# Patient Record
Sex: Female | Born: 1960 | Race: Black or African American | Hispanic: No | Smoking: Former smoker
Health system: Southern US, Community
[De-identification: ages and names within clinical notes are randomized; demographics above are authoritative.]

## PROBLEM LIST (undated history)

## (undated) DIAGNOSIS — F1911 Other psychoactive substance abuse, in remission: Secondary | ICD-10-CM

## (undated) DIAGNOSIS — J302 Other seasonal allergic rhinitis: Secondary | ICD-10-CM

## (undated) DIAGNOSIS — F1411 Cocaine abuse, in remission: Secondary | ICD-10-CM

## (undated) DIAGNOSIS — K635 Polyp of colon: Secondary | ICD-10-CM

## (undated) DIAGNOSIS — I499 Cardiac arrhythmia, unspecified: Secondary | ICD-10-CM

## (undated) DIAGNOSIS — Z8541 Personal history of malignant neoplasm of cervix uteri: Secondary | ICD-10-CM

## (undated) DIAGNOSIS — Z87891 Personal history of nicotine dependence: Secondary | ICD-10-CM

## (undated) DIAGNOSIS — I255 Ischemic cardiomyopathy: Secondary | ICD-10-CM

## (undated) DIAGNOSIS — D332 Benign neoplasm of brain, unspecified: Secondary | ICD-10-CM

## (undated) DIAGNOSIS — G8929 Other chronic pain: Secondary | ICD-10-CM

## (undated) DIAGNOSIS — D122 Benign neoplasm of ascending colon: Secondary | ICD-10-CM

## (undated) DIAGNOSIS — R51 Headache: Secondary | ICD-10-CM

## (undated) DIAGNOSIS — I10 Essential (primary) hypertension: Secondary | ICD-10-CM

## (undated) DIAGNOSIS — K2970 Gastritis, unspecified, without bleeding: Secondary | ICD-10-CM

## (undated) DIAGNOSIS — B9689 Other specified bacterial agents as the cause of diseases classified elsewhere: Secondary | ICD-10-CM

## (undated) DIAGNOSIS — I5042 Chronic combined systolic (congestive) and diastolic (congestive) heart failure: Secondary | ICD-10-CM

## (undated) DIAGNOSIS — K253 Acute gastric ulcer without hemorrhage or perforation: Secondary | ICD-10-CM

## (undated) DIAGNOSIS — I252 Old myocardial infarction: Secondary | ICD-10-CM

## (undated) DIAGNOSIS — R011 Cardiac murmur, unspecified: Secondary | ICD-10-CM

## (undated) DIAGNOSIS — I5030 Unspecified diastolic (congestive) heart failure: Secondary | ICD-10-CM

## (undated) DIAGNOSIS — I219 Acute myocardial infarction, unspecified: Secondary | ICD-10-CM

## (undated) DIAGNOSIS — R195 Other fecal abnormalities: Secondary | ICD-10-CM

## (undated) DIAGNOSIS — R112 Nausea with vomiting, unspecified: Secondary | ICD-10-CM

## (undated) DIAGNOSIS — J449 Chronic obstructive pulmonary disease, unspecified: Secondary | ICD-10-CM

## (undated) DIAGNOSIS — I2510 Atherosclerotic heart disease of native coronary artery without angina pectoris: Secondary | ICD-10-CM

## (undated) DIAGNOSIS — E785 Hyperlipidemia, unspecified: Secondary | ICD-10-CM

## (undated) DIAGNOSIS — J45909 Unspecified asthma, uncomplicated: Secondary | ICD-10-CM

## (undated) DIAGNOSIS — I739 Peripheral vascular disease, unspecified: Secondary | ICD-10-CM

## (undated) DIAGNOSIS — E236 Other disorders of pituitary gland: Secondary | ICD-10-CM

## (undated) DIAGNOSIS — M1990 Unspecified osteoarthritis, unspecified site: Secondary | ICD-10-CM

## (undated) DIAGNOSIS — R519 Headache, unspecified: Secondary | ICD-10-CM

## (undated) DIAGNOSIS — D649 Anemia, unspecified: Secondary | ICD-10-CM

## (undated) DIAGNOSIS — M5412 Radiculopathy, cervical region: Secondary | ICD-10-CM

## (undated) DIAGNOSIS — F329 Major depressive disorder, single episode, unspecified: Secondary | ICD-10-CM

## (undated) DIAGNOSIS — E079 Disorder of thyroid, unspecified: Secondary | ICD-10-CM

## (undated) DIAGNOSIS — K921 Melena: Secondary | ICD-10-CM

## (undated) DIAGNOSIS — Z8619 Personal history of other infectious and parasitic diseases: Secondary | ICD-10-CM

## (undated) DIAGNOSIS — Z800 Family history of malignant neoplasm of digestive organs: Secondary | ICD-10-CM

## (undated) DIAGNOSIS — R32 Unspecified urinary incontinence: Secondary | ICD-10-CM

## (undated) DIAGNOSIS — K759 Inflammatory liver disease, unspecified: Secondary | ICD-10-CM

## (undated) DIAGNOSIS — K219 Gastro-esophageal reflux disease without esophagitis: Secondary | ICD-10-CM

## (undated) DIAGNOSIS — M5090 Cervical disc disorder, unspecified, unspecified cervical region: Secondary | ICD-10-CM

## (undated) DIAGNOSIS — G4730 Sleep apnea, unspecified: Secondary | ICD-10-CM

## (undated) DIAGNOSIS — J40 Bronchitis, not specified as acute or chronic: Secondary | ICD-10-CM

## (undated) DIAGNOSIS — T7840XA Allergy, unspecified, initial encounter: Secondary | ICD-10-CM

## (undated) DIAGNOSIS — N760 Acute vaginitis: Secondary | ICD-10-CM

## (undated) DIAGNOSIS — E119 Type 2 diabetes mellitus without complications: Secondary | ICD-10-CM

## (undated) DIAGNOSIS — F32A Depression, unspecified: Secondary | ICD-10-CM

## (undated) DIAGNOSIS — I5022 Chronic systolic (congestive) heart failure: Secondary | ICD-10-CM

## (undated) HISTORY — DX: Essential (primary) hypertension: I10

## (undated) HISTORY — DX: Hyperlipidemia, unspecified: E78.5

## (undated) HISTORY — DX: Unspecified urinary incontinence: R32

## (undated) HISTORY — DX: Anemia, unspecified: D64.9

## (undated) HISTORY — DX: Polyp of colon: K63.5

## (undated) HISTORY — DX: Cocaine abuse, in remission: F14.11

## (undated) HISTORY — DX: Radiculopathy, cervical region: M54.12

## (undated) HISTORY — DX: Other chronic pain: G89.29

## (undated) HISTORY — DX: Chronic combined systolic (congestive) and diastolic (congestive) heart failure: I50.42

## (undated) HISTORY — DX: Peripheral vascular disease, unspecified: I73.9

## (undated) HISTORY — DX: Headache, unspecified: R51.9

## (undated) HISTORY — DX: Personal history of nicotine dependence: Z87.891

## (undated) HISTORY — DX: Cervical disc disorder, unspecified, unspecified cervical region: M50.90

## (undated) HISTORY — DX: Depression, unspecified: F32.A

## (undated) HISTORY — DX: Unspecified diastolic (congestive) heart failure: I50.30

## (undated) HISTORY — DX: Major depressive disorder, single episode, unspecified: F32.9

## (undated) HISTORY — DX: Ischemic cardiomyopathy: I25.5

## (undated) HISTORY — DX: Melena: K92.1

## (undated) HISTORY — DX: Nausea with vomiting, unspecified: R11.2

## (undated) HISTORY — DX: Old myocardial infarction: I25.2

## (undated) HISTORY — DX: Benign neoplasm of ascending colon: D12.2

## (undated) HISTORY — DX: Acute gastric ulcer without hemorrhage or perforation: K25.3

## (undated) HISTORY — DX: Chronic systolic (congestive) heart failure: I50.22

## (undated) HISTORY — DX: Cardiac arrhythmia, unspecified: I49.9

## (undated) HISTORY — DX: Other disorders of pituitary gland: E23.6

## (undated) HISTORY — DX: Gastritis, unspecified, without bleeding: K29.70

## (undated) HISTORY — DX: Other seasonal allergic rhinitis: J30.2

## (undated) HISTORY — DX: Unspecified osteoarthritis, unspecified site: M19.90

## (undated) HISTORY — DX: Other specified bacterial agents as the cause of diseases classified elsewhere: N76.0

## (undated) HISTORY — PX: ABDOMINAL HYSTERECTOMY: SHX81

## (undated) HISTORY — DX: Allergy, unspecified, initial encounter: T78.40XA

## (undated) HISTORY — DX: Personal history of malignant neoplasm of cervix uteri: Z85.41

## (undated) HISTORY — DX: Disorder of thyroid, unspecified: E07.9

## (undated) HISTORY — DX: Headache: R51

## (undated) HISTORY — DX: Other psychoactive substance abuse, in remission: F19.11

## (undated) HISTORY — DX: Other fecal abnormalities: R19.5

## (undated) HISTORY — DX: Unspecified asthma, uncomplicated: J45.909

## (undated) HISTORY — PX: TUBAL LIGATION: SHX77

## (undated) HISTORY — DX: Family history of malignant neoplasm of digestive organs: Z80.0

## (undated) HISTORY — DX: Bronchitis, not specified as acute or chronic: J40

## (undated) HISTORY — PX: TONSILLECTOMY: SUR1361

## (undated) HISTORY — PX: CORONARY ANGIOPLASTY: SHX604

## (undated) HISTORY — DX: Personal history of other infectious and parasitic diseases: Z86.19

## (undated) HISTORY — DX: Atherosclerotic heart disease of native coronary artery without angina pectoris: I25.10

## (undated) HISTORY — DX: Cardiac murmur, unspecified: R01.1

## (undated) HISTORY — DX: Gastro-esophageal reflux disease without esophagitis: K21.9

## (undated) HISTORY — DX: Other specified bacterial agents as the cause of diseases classified elsewhere: B96.89

---

## 1984-07-28 HISTORY — PX: CHOLECYSTECTOMY: SHX55

## 2001-07-28 DIAGNOSIS — I219 Acute myocardial infarction, unspecified: Secondary | ICD-10-CM

## 2001-07-28 HISTORY — DX: Acute myocardial infarction, unspecified: I21.9

## 2001-07-28 HISTORY — PX: CORONARY STENT PLACEMENT: SHX1402

## 2005-07-28 HISTORY — PX: CORONARY STENT PLACEMENT: SHX1402

## 2006-03-04 ENCOUNTER — Emergency Department: Payer: Self-pay | Admitting: Emergency Medicine

## 2006-05-10 ENCOUNTER — Emergency Department: Payer: Self-pay | Admitting: Emergency Medicine

## 2006-06-18 ENCOUNTER — Emergency Department: Payer: Self-pay | Admitting: Emergency Medicine

## 2006-07-24 ENCOUNTER — Emergency Department: Payer: Self-pay | Admitting: Emergency Medicine

## 2006-07-28 HISTORY — PX: COLONOSCOPY: SHX174

## 2006-09-14 ENCOUNTER — Emergency Department: Payer: Self-pay | Admitting: General Practice

## 2006-10-10 ENCOUNTER — Emergency Department: Payer: Self-pay | Admitting: Emergency Medicine

## 2007-02-01 ENCOUNTER — Emergency Department: Payer: Self-pay

## 2007-05-28 ENCOUNTER — Emergency Department: Payer: Self-pay | Admitting: Emergency Medicine

## 2007-07-18 ENCOUNTER — Emergency Department: Payer: Self-pay | Admitting: Emergency Medicine

## 2007-10-12 ENCOUNTER — Emergency Department: Payer: Self-pay | Admitting: Emergency Medicine

## 2008-03-14 ENCOUNTER — Emergency Department: Payer: Self-pay | Admitting: Internal Medicine

## 2008-04-22 ENCOUNTER — Emergency Department: Payer: Self-pay | Admitting: Emergency Medicine

## 2008-06-12 ENCOUNTER — Emergency Department: Payer: Self-pay | Admitting: Emergency Medicine

## 2008-10-13 ENCOUNTER — Emergency Department: Payer: Self-pay

## 2009-02-18 ENCOUNTER — Emergency Department: Payer: Self-pay | Admitting: Emergency Medicine

## 2009-07-25 ENCOUNTER — Emergency Department: Payer: Self-pay | Admitting: Emergency Medicine

## 2009-10-05 ENCOUNTER — Emergency Department: Payer: Self-pay | Admitting: Unknown Physician Specialty

## 2009-11-04 ENCOUNTER — Emergency Department: Payer: Self-pay | Admitting: Internal Medicine

## 2010-02-23 ENCOUNTER — Emergency Department: Payer: Self-pay | Admitting: Emergency Medicine

## 2010-07-28 HISTORY — PX: CORONARY ARTERY BYPASS GRAFT: SHX141

## 2010-08-12 ENCOUNTER — Encounter: Payer: Self-pay | Admitting: Cardiovascular Disease

## 2010-08-12 ENCOUNTER — Inpatient Hospital Stay: Payer: Self-pay | Admitting: Internal Medicine

## 2010-08-12 ENCOUNTER — Encounter: Payer: Self-pay | Admitting: Cardiology

## 2010-08-13 ENCOUNTER — Encounter: Payer: Self-pay | Admitting: Cardiology

## 2010-08-13 ENCOUNTER — Encounter: Payer: Self-pay | Admitting: Cardiovascular Disease

## 2010-08-14 ENCOUNTER — Encounter: Payer: Self-pay | Admitting: Cardiovascular Disease

## 2010-08-14 ENCOUNTER — Encounter: Payer: Self-pay | Admitting: Cardiology

## 2010-08-14 ENCOUNTER — Telehealth: Payer: Self-pay | Admitting: Cardiovascular Disease

## 2010-08-16 ENCOUNTER — Telehealth: Payer: Self-pay | Admitting: Cardiovascular Disease

## 2010-08-23 ENCOUNTER — Encounter: Payer: Self-pay | Admitting: Cardiology

## 2010-08-23 ENCOUNTER — Ambulatory Visit
Admission: RE | Admit: 2010-08-23 | Discharge: 2010-08-23 | Payer: Self-pay | Source: Home / Self Care | Attending: Cardiology | Admitting: Cardiology

## 2010-08-23 DIAGNOSIS — I739 Peripheral vascular disease, unspecified: Secondary | ICD-10-CM

## 2010-08-23 DIAGNOSIS — R0602 Shortness of breath: Secondary | ICD-10-CM | POA: Insufficient documentation

## 2010-08-23 DIAGNOSIS — I2510 Atherosclerotic heart disease of native coronary artery without angina pectoris: Secondary | ICD-10-CM | POA: Insufficient documentation

## 2010-08-23 DIAGNOSIS — E785 Hyperlipidemia, unspecified: Secondary | ICD-10-CM | POA: Insufficient documentation

## 2010-08-23 DIAGNOSIS — R0989 Other specified symptoms and signs involving the circulatory and respiratory systems: Secondary | ICD-10-CM | POA: Insufficient documentation

## 2010-08-23 HISTORY — DX: Shortness of breath: R06.02

## 2010-08-23 HISTORY — DX: Peripheral vascular disease, unspecified: I73.9

## 2010-08-23 HISTORY — DX: Atherosclerotic heart disease of native coronary artery without angina pectoris: I25.10

## 2010-08-29 NOTE — Progress Notes (Signed)
  Phone Note Call from Patient   Caller: Dr. [name] Summary of Call: Needs to have samples of Plavix 75 mg one tablet once daily with a 14 day free Rx card, Metoprolol Tart 25 mg one tablet two times a day # 60 with 3 refills, aspirin 325mg  one tablet once daily, samples of crestor 5 mg one tablet at bedtime to [facility] [address]. Initial call taken by: Bishop Dublin, CMA,  August 14, 2010 9:39 AM  Follow-up for Phone Call        Rx Called In for Metoprolol Tart 25 mg one tablet two times a day and Plavix 75 mg one tablet once daily #14 with 0 refill, gave samples of plavix and  crestor 5 mg. patient told to take aspirin 325 mg one tablet once daily. Follow-up by: Bishop Dublin, CMA,  August 14, 2010 9:40 AM    New/Updated Medications: METOPROLOL TARTRATE 25 MG TABS (METOPROLOL TARTRATE) one tablet two times a day PLAVIX 75 MG TABS (CLOPIDOGREL BISULFATE) one tablet once daily CRESTOR 5 MG TABS (ROSUVASTATIN CALCIUM) one tablet at bedtime ASPIRIN 325 MG TABS (ASPIRIN) one tablet once daily Prescriptions: METOPROLOL TARTRATE 25 MG TABS (METOPROLOL TARTRATE) one tablet two times a day  #60 x 3   Entered by:   Bishop Dublin, CMA   Authorized by:   Dossie Arbour MD   Signed by:   Bishop Dublin, CMA on 08/14/2010   Method used:   Handwritten   RxID:   [id number] PLAVIX 75 MG TABS (CLOPIDOGREL BISULFATE) one tablet once daily  #14 x 0   Entered by:   Bishop Dublin, CMA   Authorized by:   Dossie Arbour MD   Signed by:   Bishop Dublin, CMA on 08/14/2010   Method used:   Handwritten   RxID:   [id number]

## 2010-08-29 NOTE — Progress Notes (Signed)
Summary: Needs an inhaler  Phone Note Call from Patient Call back at Home Phone [phone]   Caller: Self Call For: Congetta Odriscoll Summary of Call: Pt LMOM that she is in need of an inhaler.  She was given one in the hospital and cannot afford to get another one.  States that her mucus is getting thick and it is making it difficult to breathe.  Pt is taking Advair.  I advised the pt that we do not prescrible this kind of medication and that bc of that, we do not have any sample inhalers to give her.  She stated that she does not have any other doctors and that Dr. [name] is aware of her situation and advised her to call us if she needed anything.  I told the pt that I would make sure to get the message to Dr. [name], but that I was not sure what we will be able to do since we do not have inhalers here.  The pt was okay with this. Initial call taken by: Harlon Flor,  August 16, 2010 10:58 AM  Follow-up for Phone Call        Attempted to call pt LMOM TCB Lanny Hurst RN  August 19, 2010 9:00 AM  Spoke to pt, she states she never received an inhaler upon discharge from hospital, was only given a rx. Told pt I wasn't sure she was supposed to receive one at d/c but to try to get rx filled and see if it is too much financially. Pt does not have insurance and she is ok with trying to see if she can afford and she will call back and let us know if there are any problems. Follow-up by: Lanny Hurst RN,  August 20, 2010 2:37 PM

## 2010-09-04 ENCOUNTER — Telehealth: Payer: Self-pay | Admitting: Cardiology

## 2010-09-04 NOTE — Cardiovascular Report (Signed)
SummaryScientist, physiological [facility] Cath Report   [facility] Cath Report   Imported By: Roderic Ovens 08/30/2010 14:41:18  _____________________________________________________________________  External Attachment:    Type:   Image     Comment:   External Document

## 2010-09-04 NOTE — Letter (Signed)
SummaryScientist, physiological [facility]   [facility]   Imported By: Roderic Ovens 08/30/2010 15:54:21  _____________________________________________________________________  External Attachment:    Type:   Image     Comment:   External Document

## 2010-09-04 NOTE — Assessment & Plan Note (Signed)
Summary: NP6/AMD   Visit Type:  Initial Consult Primary Provider:  None  CC:  F/U [facility].   Establish care with a cardiologist.  c/o shortness of breath, has twinges in arms, lower abdominal pain, has a knot at right groin s/p cardiac cath, and chest pain and dizziness.[name]  History of Present Illness: 50 yo with h/o CAD s/p recent NSTEMI at [facility] presents to establish outpatient cardiology followup.  Patient was admitted to [facility] with NSTEMI in 1/12.  She has BMS to pLAD (80% stenosis).  She was discharged recently and actually went back to work 4 days after she left the hospital.  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  She notices that she is newly short of breath pushing her cleaning cart, mopping, and doing more heavy exertion.  She does ok on flat ground.  She has, additionally, been getting tightness in her chest radiating to the left arm with mopping and heavy lifting.  She also notes a cramp/pain in her right calf with exertion.  This happens frequently.  Pain resolves with rest.  Her work schedule will make it difficult to do cardiac rehab.  Patient has quit smoking  ECG: NSR, nonspecific T wave flattening  Labs (1/12): LDL 123, creatinine 0.72, troponin elevated maximally to 0.1    Current Medications (verified): 1)  Metoprolol Tartrate 25 Mg Tabs (Metoprolol Tartrate) .... One Tablet Two Times A Day 2)  Plavix 75 Mg Tabs (Clopidogrel Bisulfate) .... One Tablet Once Daily 3)  Crestor 5 Mg Tabs (Rosuvastatin Calcium) .... One Tablet At Bedtime 4)  Aspirin 325 Mg Tabs (Aspirin) .... One Tablet Once Daily  Allergies (verified): 1)  ! Sulfa 2)  ! 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  Quit Jan. 16, 2012 Drug Use - yes-remote hx of cocaine  Risk Factors: Smoking Status: current (08/21/2010)  Past Medical History: 1. CAD: PCI to CFX in 2003.  1/12 to [facility] with NSTEMI.  LHC (1/12) with EF 60%, 80% prox LAD, ostial 80% D1, patent prox CFX stent, 80% prox RCA.  2.5 x 8 BMS to proximal LAD.   2. Hyperlipidemia 3. Hypertension 4. Suspect COPD 5. cholecystectomy  Family History: Reviewed history from 08/21/2010 and no changes required. 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 Quit Jan. 16, 2012 Drug Use - yes-remote hx of cocaine  Review of Systems       All systems reviewed and negative except as per HPI.   Vital Signs:  Patient profile:   50 year old female Height:      66 inches Weight:      178 pounds BMI:     28.83 Pulse rate:   61 / minute BP sitting:   145 / 96  (left arm) Cuff size:   regular  Vitals Entered By: Bishop Dublin, CMA (August 23, 2010 10:49 AM)  Physical Exam  General:  Well developed, well nourished, in no acute distress. Head:  normocephalic and atraumatic Nose:  no deformity, discharge, inflammation, or lesions Mouth:  Teeth, gums and palate normal. Oral mucosa normal. Neck:  Neck supple, no JVD. No masses, thyromegaly or abnormal cervical nodes. Lungs:  Diminished  breath sounds bilaterally, rhonchi.  Heart:  Non-displaced PMI, chest non-tender; regular rate and rhythm, S1, S2 without murmurs, rubs or gallops. Carotid upstroke normal, left carotid bruit.  Feet warm but unable to palpate pulses.  No edema, no varicosities. Abdomen:  Bowel sounds positive; abdomen soft and non-tender without masses, organomegaly, or hernias noted. No hepatosplenomegaly. Extremities:  No clubbing or cyanosis. Neurologic:  Alert and oriented x 3. Skin:  Intact without lesions or rashes. Psych:  Normal affect.   Impression & Recommendations:  Problem # 1:  SHORTNESS OF BREATH  (ICD-786.05) NSTEMI earler this month with significant exertional dyspnea.  No volume overload on exam.  Will get echo to assess LV and RV size and function.  I think she went back to work too quickly and have asked her to try to take 2 weeks off while she recovers.   I suspect that there is also a component of COPD contributing to dyspnea.  I will have her eventually get PFTs.  I will give her a prescription for Spiriva.    Problem # 2:  CLAUDICATION (ICD-443.9) Right calf symptoms very consistent with claudication.  Unable to feel pedal pulses.  HIstory of smoking.  Will check arterial dopplers.    Problem # 3:  CAROTID BRUIT (ICD-785.9) Left carotid bruit, will get carotid dopplers.    Problem # 4:  CAD, UNSPECIFIED SITE (ICD-414.00) Patient continues to have exertional dyspnea.  She does have an 80% lesion in a small RCA that was not revascularized.  For now, I am going to try to treat medically.  I will stop metoprolol and have her take Coreg 6.25 mg two times a day instead.  Start Imdur 30 mg daily and lisinopril 5 mg daily for secondary prevention.  She can decrease ASA to 81 mg daily.  I will have her stop Crestor (cannot afford) and use simvastatin instead.   Problem # 5:  HYPERLIPIDEMIA-MIXED (ICD-272.4) Simvastatin 40 mg daily.  Lipids/LFTs in 2 months.   Other Orders: Carotid Duplex (Carotid Duplex) Arterial Duplex Lower Extremity (Arterial Duplex Low) Echocardiogram (Echo)  Patient Instructions: 1)  Your physician recommends that you schedule a follow-up appointment in: 2 weeks 2)  Your physician has recommended you make the following change in your medication: STOP Crestor. STOP Metoprolol. START Zocor 40mg  once daily. START Coreg 6.25mg  two times a day. START Lisinopril 5mg  once daily. START Imdur 30mg  once daily. START Spiriva Inhaler 2 puffs once daily.  3)  Your physician has requested that you have a carotid duplex. This test is an ultrasound of the carotid arteries in your  neck. It looks at blood flow through these arteries that supply the brain with blood. Allow one hour for this exam. There are no restrictions or special instructions. 4)  Your physician has requested that you have a lower or upper extremity arterial duplex.  This test is an ultrasound of the arteries in the legs or arms.  It looks at arterial blood flow in the legs and arms.  Allow one hour for Lower and Upper Arterial scans. There are no restrictions or special instructions. 5)  Your physician has requested that you have an echocardiogram.  Echocardiography is a painless test that uses sound waves to create images of your heart. It provides your doctor with information about the size and shape of your heart and how well your heart's chambers and valves are working.  This procedure takes approximately one hour. There are no restrictions for this procedure. Prescriptions: SPIRIVA  HANDIHALER 18 MCG CAPS (TIOTROPIUM BROMIDE MONOHYDRATE) 2 puffs once daily  #30 x 6   Entered by:   Lanny Hurst RN   Authorized by:   Marca Ancona, MD   Signed by:   Lanny Hurst RN on 08/23/2010   Method used:   Electronically to        [facility] [address] (retail)       [address], [address]       [place], [place]  [address]       Ph: [phone]       Fax: [phone]   RxID:   [id number] ISOSORBIDE MONONITRATE CR 30 MG XR24H-TAB (ISOSORBIDE MONONITRATE) Take one tablet by mouth daily  #30 x 6   Entered by:   Lanny Hurst RN   Authorized by:   Marca Ancona, MD   Signed by:   Lanny Hurst RN on 08/23/2010   Method used:   Electronically to        [facility] [address] (retail)       [address], [address]       [place], [place]  [address]       Ph: [phone]       Fax: [phone]   RxID:   [id number] LISINOPRIL 5 MG TABS (LISINOPRIL) Take one tablet by mouth daily  #30 x 6   Entered by:   Lanny Hurst RN   Authorized by:   Marca Ancona, MD   Signed by:   Lanny Hurst RN on 08/23/2010   Method used:   Electronically to        [facility] [address] (retail)       [address], [address]       [place], [place]  [address]       Ph: [phone]       Fax: [phone]   RxID:   [id number] CARVEDILOL 6.25 MG TABS (CARVEDILOL) Take one tablet by mouth twice a day  #60 x 6   Entered by:   Lanny Hurst RN   Authorized by:   Marca Ancona, MD   Signed by:   Lanny Hurst RN on 08/23/2010   Method used:   Electronically to        [facility] [address] (retail)       [address], [address]       [place], [place]  [address]       Ph: [phone]       Fax: [phone]   RxID:   [id number] ZOCOR 40 MG TABS (SIMVASTATIN) Take one tablet once daily.  #30 x 6   Entered by:   Lanny Hurst RN   Authorized by:   Marca Ancona, MD   Signed by:   Lanny Hurst RN on 08/23/2010   Method used:   Electronically to        [facility] [address] (retail)       [address]       [place], [place]  [address]       Ph: [phone]       Fax: [phone]   RxID:   [id number]

## 2010-09-05 ENCOUNTER — Other Ambulatory Visit: Payer: Self-pay | Admitting: Cardiology

## 2010-09-05 ENCOUNTER — Encounter (INDEPENDENT_AMBULATORY_CARE_PROVIDER_SITE_OTHER): Payer: Self-pay

## 2010-09-05 ENCOUNTER — Other Ambulatory Visit (INDEPENDENT_AMBULATORY_CARE_PROVIDER_SITE_OTHER): Payer: Self-pay

## 2010-09-05 ENCOUNTER — Encounter: Payer: Self-pay | Admitting: Cardiology

## 2010-09-05 DIAGNOSIS — I2510 Atherosclerotic heart disease of native coronary artery without angina pectoris: Secondary | ICD-10-CM

## 2010-09-05 DIAGNOSIS — I70219 Atherosclerosis of native arteries of extremities with intermittent claudication, unspecified extremity: Secondary | ICD-10-CM

## 2010-09-05 DIAGNOSIS — R0989 Other specified symptoms and signs involving the circulatory and respiratory systems: Secondary | ICD-10-CM

## 2010-09-05 DIAGNOSIS — I739 Peripheral vascular disease, unspecified: Secondary | ICD-10-CM

## 2010-09-05 DIAGNOSIS — R0602 Shortness of breath: Secondary | ICD-10-CM

## 2010-09-09 ENCOUNTER — Ambulatory Visit (INDEPENDENT_AMBULATORY_CARE_PROVIDER_SITE_OTHER): Payer: Self-pay | Admitting: Cardiovascular Disease

## 2010-09-09 ENCOUNTER — Encounter: Payer: Self-pay | Admitting: Cardiovascular Disease

## 2010-09-09 DIAGNOSIS — I739 Peripheral vascular disease, unspecified: Secondary | ICD-10-CM

## 2010-09-09 DIAGNOSIS — E785 Hyperlipidemia, unspecified: Secondary | ICD-10-CM

## 2010-09-09 DIAGNOSIS — I2510 Atherosclerotic heart disease of native coronary artery without angina pectoris: Secondary | ICD-10-CM

## 2010-09-10 ENCOUNTER — Institutional Professional Consult (permissible substitution): Payer: Self-pay | Admitting: Cardiovascular Disease

## 2010-09-12 NOTE — Progress Notes (Signed)
Summary: [facility] financial assitance  ---- Converted from flag ---- ---- 08/26/2010 10:32 AM, Lanny Hurst RN wrote: Follow up on pt's financial assistance with [facility], forms were given at pt ov 08/23/10 ------------------------------  Pt was given financial assitance forms for [facility]. Spoke to pt today, she will send in paperwork and contact our office when she does so and we will followup to make sure it is processed.

## 2010-09-18 NOTE — Assessment & Plan Note (Signed)
Summary: F2W/AMD   Visit Type:  Follow-up Primary Provider:  None  CC:  F/U test.  Still has shortness of breath with chest pain & numbness in legs with shoulder pain.  She does have some pain in her eyes.[name]  History of Present Illness: 50 yo with h/o CAD s/p recent NSTEMI at [facility] presents to establish outpatient cardiology followup.  Patient was admitted to [facility] with NSTEMI in 1/12.  She has BMS to pLAD (80% stenosis).  she does have residual severe stenosis of the RCA which is a nondominant small vessel.   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  She does ok on flat ground.  She has, additionally, been getting tightness in her chest radiating to the left arm with mopping and heavy lifting.  She also notes a cramp/pain in her right calf with exertion.  This happens frequently.  Pain resolves with rest.  Her work schedule will make it difficult to do cardiac rehab.  Patient has quit smoking  she has had 5 episodes of chest pain that lasted for 30 minutes that caused her to stop work. Typically it came on with heavy exertion. She does not have any nitroglycerin sublingual.  ECG: NSR, rate of 61 beats per minute, no significant ST or T wave changes  recent echocardiogram showing ejection fraction 45-50%  Lower extremity Doppler showing severe right distal SFA disease  Labs (1/12): LDL 123, creatinine 0.72, troponin elevated maximally to 0.1    Current Medications (verified): 1)  Carvedilol 6.25 Mg Tabs (Carvedilol) .... Take One Tablet By Mouth Twice A Day 2)  Plavix 75 Mg Tabs (Clopidogrel Bisulfate) .... One Tablet Once Daily 3)  Zocor 40 Mg Tabs (Simvastatin) .... Take One Tablet Once Daily. 4)  Aspirin 325 Mg Tabs (Aspirin) .... One Tablet Once Daily 5)  Lisinopril 5 Mg Tabs (Lisinopril) .... Take One Tablet By Mouth Daily 6)  Isosorbide Mononitrate Cr 30 Mg Xr24h-Tab (Isosorbide  Mononitrate) .... Take One Tablet By Mouth Daily 7)  Spiriva Handihaler 18 Mcg Caps (Tiotropium Bromide Monohydrate) .... 2 Puffs Once Daily--Patient Not Taking This; Can't Afford It.  Allergies (verified): 1)  ! Sulfa 2)  ! * Latex  Past History:  Past Medical History: Last updated: 08/23/2010 1. CAD: PCI to CFX in 2003.  1/12 to [facility] with NSTEMI.  LHC (1/12) with EF 60%, 80% prox LAD, ostial 80% D1, patent prox CFX stent, 80% prox RCA.  2.5 x 8 BMS to proximal LAD.   2. Hyperlipidemia 3. Hypertension 4. Suspect COPD 5. 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 16, 2012 Drug Use - yes-remote hx of cocaine  Risk Factors: Smoking Status: current (08/21/2010)  Review of Systems       The patient complains of chest pain.  The patient denies fever, weight loss, weight gain, vision loss, decreased hearing, hoarseness, syncope, peripheral edema, prolonged cough, abdominal pain, incontinence, muscle weakness, depression, and enlarged lymph nodes.         leg cramping with exertion  Vital Signs:  Patient profile:   50 year old female Height:      66 inches Pulse rate:   66 /  minute BP sitting:   129 / 86  (left arm) Cuff size:   regular  Vitals Entered By: Bishop Dublin, CMA (September 09, 2010 11:25 AM)  Physical Exam  General:  Well developed, well nourished, in no acute distress. Head:  normocephalic and atraumatic Neck:  Neck supple, no JVD. No masses, thyromegaly or abnormal cervical nodes. Lungs:  Diminished breath sounds bilaterally, rhonchi.  Heart:  Non-displaced PMI, chest non-tender; regular rate and rhythm, S1, S2 without murmurs, rubs or gallops. Carotid upstroke normal, no  bruit. Normal abdominal aortic size, no bruits.  Pedal pulses normal on left, difficult to appreciate on the right. No edema, no varicosities. Abdomen:  Bowel sounds positive; abdomen soft and non-tender without masses Msk:  Back normal, normal gait. Muscle strength and tone normal. Extremities:  No clubbing or cyanosis. Neurologic:  Alert and oriented x 3. Skin:  Intact without lesions or rashes. Psych:  Normal affect.   Impression & Recommendations:  Problem # 1:  CAD, UNSPECIFIED SITE (ICD-414.00) stent placed to her LAD with improved symptoms though continued episodic chest pain. She is able to exert herself with rare episodes. We will continue to treat medically. We have given her sublingual nitroglycerin. We can increase her isosorbide to b.i.d. if needed for worsening chest pain.  Continue aspirin and Plavix with recent stent  Her updated medication list for this problem includes:    Carvedilol 6.25 Mg Tabs (Carvedilol) .[name]... Take one tablet by mouth twice a day    Plavix 75 Mg Tabs (Clopidogrel bisulfate) ..... One tablet once daily    Aspirin 325 Mg Tabs (Aspirin) ..... One tablet once daily    Lisinopril 5 Mg Tabs (Lisinopril) .[name]... Take one tablet by mouth daily    Isosorbide Mononitrate Cr 30 Mg Xr24h-tab (Isosorbide mononitrate) .[name]... Take one tablet by mouth daily    Nitrostat 0.4 Mg Subl (Nitroglycerin) .[name]... 1 tablet under tongue at onset of chest pain; you may repeat every 5 minutes for up to 3 doses.  Problem # 2:  CLAUDICATION (ICD-443.9) She does have claudication type symptoms with a ultrasound showing significant right distal SFA disease. We will refer her to one of the vascular interventionalists for evaluation.  Problem # 3:  HYPERLIPIDEMIA-MIXED (ICD-272.4) continue aggressive medical management. Goal LDL is less than 70.  Her updated medication list for this problem includes:    Zocor 40 Mg Tabs (Simvastatin) .[name]... Take one tablet once daily.  Patient  Instructions: 1)  Your physician recommends that you schedule a follow-up appointment in: 6 months 2)  Your physician has recommended you make the following change in your medication: START Nitrostat 0.4mg  SL as needed for chest pain. 3)  You have a follow up appt with Dr. Excell Seltzer 09/25/10 @ 9:45am. Prescriptions: NITROSTAT 0.4 MG SUBL (NITROGLYCERIN) 1 tablet under tongue at onset of chest pain; you may repeat every 5 minutes for up to 3 doses.  #25 x 3   Entered by:   Lanny Hurst RN   Authorized by:   Dossie Arbour MD   Signed by:   Lanny Hurst RN on 09/09/2010   Method used:   Electronically to        [facility] [address]ail)       [address], [address]       [place], [place]  [address]       Ph: [phone]       Fax: [phone]  RxID:   [id number]

## 2010-09-18 NOTE — Letter (Signed)
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 [address]   [place], [place] [address]   Phone: [phone]  Fax: [phone]     September 09, 2010    [facility]   The above named patient has a medical visit scheduled in [place] for 09/25/10 @ 9:45 am.  Please take this into consideration when reviewing the time away from work.    Sincerely yours,        Dossie Arbour, MD  [facility]

## 2010-09-24 NOTE — Miscellaneous (Signed)
Summary: No Show  No Show   Imported By: Harlon Flor 09/16/2010 13:22:08  _____________________________________________________________________  External Attachment:    Type:   Image     Comment:   External Document

## 2010-09-25 ENCOUNTER — Encounter: Payer: Self-pay | Admitting: Cardiovascular Disease

## 2010-09-25 ENCOUNTER — Institutional Professional Consult (permissible substitution) (INDEPENDENT_AMBULATORY_CARE_PROVIDER_SITE_OTHER): Payer: Self-pay | Admitting: Cardiovascular Disease

## 2010-09-25 DIAGNOSIS — I70219 Atherosclerosis of native arteries of extremities with intermittent claudication, unspecified extremity: Secondary | ICD-10-CM

## 2010-09-25 DIAGNOSIS — I209 Angina pectoris, unspecified: Secondary | ICD-10-CM

## 2010-09-26 ENCOUNTER — Telehealth (INDEPENDENT_AMBULATORY_CARE_PROVIDER_SITE_OTHER): Payer: Self-pay | Admitting: *Deleted

## 2010-10-03 NOTE — Letter (Signed)
SummaryScientist, physiological [facility]   [facility]   Imported By: Roderic Ovens 09/23/2010 11:10:28  _____________________________________________________________________  External Attachment:    Type:   Image     Comment:   External Document

## 2010-10-03 NOTE — Cardiovascular Report (Signed)
SummaryScientist, physiological [facility]   [facility]   Imported By: Roderic Ovens 09/20/2010 16:15:16  _____________________________________________________________________  External Attachment:    Type:   Image     Comment:   External Document

## 2010-10-03 NOTE — Progress Notes (Addendum)
  ROI faxed to [facility]. [facility]  September 26, 2010 9:18 AM     Appended Document:  Records Received from [facility] gave to [place]   Appended Document:  Called and Spoke with Angelique Blonder in Medical Records @ [facility], Have never received Copy of Cath on CD.Marland KitchenAngelique Blonder assured me she will take ROI over to [facility] in Cardiopulmonary Dept and ask to copy. I have Falgged Lauren to let her know .Once CD is here I will forward to Lauren Asap.

## 2010-10-05 ENCOUNTER — Encounter: Payer: Self-pay | Admitting: Cardiovascular Disease

## 2010-10-15 NOTE — Assessment & Plan Note (Signed)
Summary: npv. right ssa blockage   Visit Type:  Initial Consult Primary Provider:  None  CC:  New PV pt per Dr. [name]-.  History of Present Illness: 50 year-old woman presenting for initial evaluation of lower extremity PAD. She recently underwent a lower extremity arterial duplex scan suggestive of severe distal right SFA stenosis with a peak systolic velocity of 483 cm/s. Her ABI's are 0.73 on the right and 1.0 on the left.  The patient reports right calf claudication at low level exertion. She has to stop after less than 50 feet of walking and this has greatly interfered with her job. She denies left leg pain with walking, but has bilateral leg pain at night. She denies rest pain or ulceration of either foot. She has noted exertional right leg pain since 2008 and now reports worsening symptoms over the past several months where she states "I can hardly walk."  The patient quit smoking Jan 16th when she had an MI and was treated at [facility].   SInce her PCI procedure, she continues to complain of daily chest tightness, predominately with exertion. Overall chest pain is better than prior to PCI, but this remains a predominant complaint.  Current Medications (verified): 1)  Carvedilol 6.25 Mg Tabs (Carvedilol) .... Take One Tablet By Mouth Twice A Day 2)  Plavix 75 Mg Tabs (Clopidogrel Bisulfate) .... One Tablet Once Daily 3)  Zocor 40 Mg Tabs (Simvastatin) .... Take One Tablet Once Daily. 4)  Aspirin 325 Mg Tabs (Aspirin) .... One Tablet Once Daily 5)  Lisinopril 5 Mg Tabs (Lisinopril) .... Take One Tablet By Mouth Daily 6)  Isosorbide Mononitrate Cr 30 Mg Xr24h-Tab (Isosorbide Mononitrate) .... Take One Tablet By Mouth Daily 7)  Spiriva Handihaler 18 Mcg Caps (Tiotropium Bromide Monohydrate) .... 2 Puffs Once Daily--Patient Not Taking This; Can't Afford It. 8)  Nitrostat 0.4 Mg Subl (Nitroglycerin) .[name].. 1 Tablet Under Tongue At Onset of Chest Pain; You May Repeat Every 5  Minutes For Up To 3 Doses.  Allergies: 1)  ! Sulfa 2)  ! * Latex  Past History:  Past medical, surgical, family and social histories (including risk factors) reviewed, and no changes noted (except as noted below).  Past Medical History: 1. CAD: PCI to CFX in 2003.  1/12 to [facility] with NSTEMI.  LHC (1/12) with EF 60%, 80% prox LAD, ostial 80% D1, patent prox CFX stent, 80% prox RCA.  2.5 x 8 BMS to proximal LAD.   2. Hyperlipidemia 3. Hypertension 4. Suspect COPD 5. cholecystectomy 6. PAD with intermittent claudication  Past Surgical History: Reviewed history from 08/21/2010 and no changes required. Cholecystectomy Stent placement s/p MI-2003 Stent placementx1 in 2007-[place]  Family History: Reviewed history from 08/21/2010 and no changes required. Family hx of HTN and CHF  Social History: Reviewed history from 08/23/2010 and no changes required. Custodial work at [employer] Tobacco Use - Yes-1 ppd since age 2. Quit Jan. 16, 2012 Drug Use - yes-remote hx of cocaine  Review of Systems       Exertional chest pain, occasional left arm pain, otherwise negative except as per HPI.  Vital Signs:  Patient profile:   50 year old female Height:      66 inches Weight:      187.50 pounds BMI:     30.37 Pulse rate:   68 / minute Pulse rhythm:   regular Resp:     18 per minute BP sitting:   160 / 100  (left arm) Cuff  size:   large  Vitals Entered By: Vikki Ports (September 25, 2010 9:59 AM)  Serial Vital Signs/Assessments:  Time      Position  BP       Pulse  Resp  Temp     By           R Arm     150/100                        Vikki Ports   Physical Exam  General:  Pt is well-developed, alert and oriented, no acute distress HEENT: normal Neck: no thyromegaly           JVP normal, carotid upstrokes normal without bruits Lungs: CTA Chest: equal expansion  CV: Apical impulse nondisplaced, RRR without murmur or gallop Abd: soft, NT, positive BS, no HSM, no bruit Back:  no CVA tenderness Ext: no clubbing, cyanosis, or edema        femoral pulses 2+ without bruits        left pedal pulses 2+, right pedals faint. Feet are warm without ulceration. Skin: warm, dry, no rash Neuro: CNII-XII intact,strength 5/5 = b/l    Impression & Recommendations:  Problem # 1:  CLAUDICATION (ICD-443.9) The patient has severe, lifestyle limiting claudication. Her noninvasive study suggests severe right SFA stenosis. She does not have evidence of critical limb ischemia or rest pain. I have recommended initiation of Pletal. I think there is a high likelihood she will require revascularization because of significant limitation related to her daily activities as well as her job. I'm also concerned about her chest pain following PCI. We have requested her catheterization films from [facility]. This will be reviewed in the context of her lower extremity PAD with consideration of coronary angiography if lower extremity angiography is required. She should remain on dual antiplatelet therapy as well as aggressive risk reduction measures. The importance of continued tobacco cessation was reviewed. The patient will followup in one month. She was counseled regarding progressive chest or leg pain and the need to call 911 if rest pain occurs.  Patient Instructions: 1)  Your physician recommends that you schedule a follow-up appointment in: 1 MONTH 2)  Your physician has recommended you make the following change in your medication: START Pletal (Cilostazol)  100mg  take one-half tablet by mouth two times a day for 2 WEEKS then increase to 100mg  one tablet by mouth two times a day  Prescriptions: CILOSTAZOL 100 MG TABS (CILOSTAZOL) take one tablet by mouth two times a day  #60 x 6   Entered by:   Julieta Gutting, RN, BSN   Authorized by:   Norva Karvonen, MD   Signed by:   Julieta Gutting, RN, BSN on 09/25/2010   Method used:   Electronically to        [facility]  [id number] (retail)       [address], [address]       [place], [place]  [address]       Ph: [phone]       Fax: [phone]   RxID:   [id number]

## 2010-10-21 ENCOUNTER — Ambulatory Visit (INDEPENDENT_AMBULATORY_CARE_PROVIDER_SITE_OTHER): Payer: Self-pay | Admitting: Cardiovascular Disease

## 2010-10-21 ENCOUNTER — Encounter: Payer: Self-pay | Admitting: Cardiovascular Disease

## 2010-10-21 DIAGNOSIS — I2510 Atherosclerotic heart disease of native coronary artery without angina pectoris: Secondary | ICD-10-CM

## 2010-10-21 DIAGNOSIS — I739 Peripheral vascular disease, unspecified: Secondary | ICD-10-CM

## 2010-10-21 DIAGNOSIS — I70219 Atherosclerosis of native arteries of extremities with intermittent claudication, unspecified extremity: Secondary | ICD-10-CM

## 2010-10-21 NOTE — Progress Notes (Signed)
HPI:  This is a 50 year old woman presenting for followup evaluation. She has both coronary artery disease and peripheral arterial disease.  The patient was seen last month for evaluation of peripheral arterial disease. She has been diagnosed with severe right superficial femoral artery stenosis based on duplex ultrasound scanning. She reports intermittent claudication of the right calf. I started her on cilostazol and she has not appreciated much improvement yet. She also complains of bilateral leg pain across the upper legs and hip area. This pain radiates down both thighs. She also describes pain in her lower legs at night and numbness in the toes of both feet, right worse and left.  The patient continues to have chest pain. She reports this as a pressure-like sensation and it occurs with low level exertion. Episodes are associated with diaphoresis. She takes occasional nitroglycerin but this gives her a severe headache. Her pain episodes have been self-limited and they resolve after a few minutes of rest.  She underwent stenting of the left circumflex back in 2003. The patient presented with recurrent angina in January 2012 when she was treated with a bare-metal stent to the proximal LAD. This was performed at [facility]. I now had a chance to review her catheter films and we discussed this in detail today.  Outpatient Encounter Prescriptions as of 10/21/2010  Medication Sig Dispense Refill  . aspirin 325 MG tablet Take 325 mg by mouth daily.        . carvedilol (COREG) 6.25 MG tablet Take 6.25 mg by mouth 2 (two) times daily with a meal.        . cilostazol (PLETAL) 100 MG tablet Take 100 mg by mouth 2 (two) times daily.        . clopidogrel (PLAVIX) 75 MG tablet Take 75 mg by mouth daily.        . isosorbide mononitrate (IMDUR) 30 MG 24 hr tablet Take 30 mg by mouth daily.        [name] lisinopril (PRINIVIL,ZESTRIL) 5 MG tablet Take 1 tablet (5 mg total) by mouth daily.      .  nitroGLYCERIN (NITROSTAT) 0.4 MG SL tablet Place 0.4 mg under the tongue every 5 (five) minutes as needed.        . simvastatin (ZOCOR) 40 MG tablet Take 40 mg by mouth at bedtime.        [name] DISCONTD: lisinopril (PRINIVIL,ZESTRIL) 5 MG tablet Take 5 mg by mouth daily.        [name] DISCONTD: tiotropium (SPIRIVA) 18 MCG inhalation capsule Place 18 mcg into inhaler and inhale daily.          Allergies  Allergen Reactions  . Latex   . Sulfonamide Derivatives     Past Medical History  Diagnosis Date  . Coronary artery disease   . Hyperlipidemia   . Hypertension     ROS: Positive for nonproductive cough, otherwise negative except as per HPI  BP 136/84  Pulse 72  Ht 5\' 5"  (1.651 m)  Wt 186 lb 12.8 oz (84.732 kg)  BMI 31.09 kg/m2  PHYSICAL EXAM: Pt is alert and oriented, NAD HEENT: normal Neck: JVP - normal, carotids 2+= without bruits Lungs: CTA bilaterally CV: RRR without murmur or gallop Abd: soft, NT, Positive BS, no hepatomegaly Ext: no C/C/E, distal pulses 2+ left pedal and absent right pedals Skin: warm/dry no rash  EKG:  Normal sinus rhythm heart rate 74 beats per minute, nonspecific T wave abnormality.  ASSESSMENT AND PLAN:

## 2010-10-21 NOTE — Patient Instructions (Addendum)
Your physician has requested that you have a cardiac catheterization. Cardiac catheterization is used to diagnose and/or treat various heart conditions. Doctors may recommend this procedure for a number of different reasons. The most common reason is to evaluate chest pain. Chest pain can be a symptom of coronary artery disease (CAD), and cardiac catheterization can show whether plaque is narrowing or blocking your heart's arteries. This procedure is also used to evaluate the valves, as well as measure the blood flow and oxygen levels in different parts of your heart. For further information please visit [email or web address] Please follow instruction sheet, as given.  Your physician has requested that you have a peripheral vascular angiogram. This exam is performed at the hospital. During this exam IV contrast is used to look at arterial blood flow. Please review the information sheet given for details.  Your physician recommends that you continue on your current medications as directed. Please refer to the Current Medication list given to you today.  Continue Lisinopril 5mg  daily at this time (side effect cough)---please check on the price of Losartan 50mg  daily and if you can afford this medication please call the office for new prescription.

## 2010-10-21 NOTE — Assessment & Plan Note (Addendum)
The patient continues to have significant angina. I reviewed her catheter films today and this shows severe disease of the large diagonal branch. The diseased segment extends back to the ostium of the diagonal as it arises from the mid LAD. The mid LAD in that segment has nonobstructive stenosis but I would estimate at 30-40%. The patient's circumflex stent was patent in the proximal vessel with mild restenosis. The proximal LAD had severe stenosis that was treated with a 2.5 x 8 mm bare-metal stent.  With ongoing angina, I think the patient needs relook catheterization. Her LAD/diagonal bifurcation is approachable with percutaneous intervention, but it would require complex stenting with stenting of both branches involving the bifurcation. I advised the patient that I would plan on diagnostic angiography and then a careful review of her revascularization options including complex stenting versus coronary bypass. Risks and indication of the procedure were reviewed with the patient and her family and they agree to proceed.  The patient has developed a cough and I suspect this is due to lisinopril. We'll plan on changing her losartan. She would like to check on the cost of this prior to starting this medication.

## 2010-10-21 NOTE — Assessment & Plan Note (Signed)
The patient has lifestyle limiting claudication. Will plan on lower extremity angiography at the time of her catheterization procedure. Much of her upper leg pain is atypical and I do not think it is related to vascular disease.

## 2010-10-27 HISTORY — PX: FEMORAL ARTERY STENT: SHX1583

## 2010-11-04 ENCOUNTER — Encounter: Payer: Self-pay | Admitting: Cardiovascular Disease

## 2010-11-05 ENCOUNTER — Telehealth: Payer: Self-pay | Admitting: Cardiology

## 2010-11-05 NOTE — Telephone Encounter (Signed)
I spoke with the pt's sister and made her aware that the pt should get a letter from [employer] to let her know if she is approved for Plavix assistance program.

## 2010-11-06 ENCOUNTER — Telehealth: Payer: Self-pay

## 2010-11-06 ENCOUNTER — Inpatient Hospital Stay (HOSPITAL_COMMUNITY)
Admission: RE | Admit: 2010-11-06 | Discharge: 2010-11-13 | DRG: 234 | Disposition: A | Discharge status: Home / Self Care | Payer: Self-pay | Source: Ambulatory Visit | Attending: Cardiothoracic Surgery | Admitting: Cardiothoracic Surgery

## 2010-11-06 DIAGNOSIS — D6959 Other secondary thrombocytopenia: Secondary | ICD-10-CM | POA: Diagnosis not present

## 2010-11-06 DIAGNOSIS — E162 Hypoglycemia, unspecified: Secondary | ICD-10-CM | POA: Diagnosis not present

## 2010-11-06 DIAGNOSIS — F17200 Nicotine dependence, unspecified, uncomplicated: Secondary | ICD-10-CM | POA: Diagnosis present

## 2010-11-06 DIAGNOSIS — T82897A Other specified complication of cardiac prosthetic devices, implants and grafts, initial encounter: Secondary | ICD-10-CM | POA: Diagnosis present

## 2010-11-06 DIAGNOSIS — I10 Essential (primary) hypertension: Secondary | ICD-10-CM | POA: Diagnosis present

## 2010-11-06 DIAGNOSIS — J4489 Other specified chronic obstructive pulmonary disease: Secondary | ICD-10-CM | POA: Diagnosis present

## 2010-11-06 DIAGNOSIS — Z7982 Long term (current) use of aspirin: Secondary | ICD-10-CM

## 2010-11-06 DIAGNOSIS — I70219 Atherosclerosis of native arteries of extremities with intermittent claudication, unspecified extremity: Secondary | ICD-10-CM | POA: Diagnosis present

## 2010-11-06 DIAGNOSIS — I4949 Other premature depolarization: Secondary | ICD-10-CM | POA: Diagnosis not present

## 2010-11-06 DIAGNOSIS — I2510 Atherosclerotic heart disease of native coronary artery without angina pectoris: Principal | ICD-10-CM | POA: Diagnosis present

## 2010-11-06 DIAGNOSIS — Y849 Medical procedure, unspecified as the cause of abnormal reaction of the patient, or of later complication, without mention of misadventure at the time of the procedure: Secondary | ICD-10-CM | POA: Diagnosis present

## 2010-11-06 DIAGNOSIS — E785 Hyperlipidemia, unspecified: Secondary | ICD-10-CM | POA: Diagnosis present

## 2010-11-06 DIAGNOSIS — D62 Acute posthemorrhagic anemia: Secondary | ICD-10-CM | POA: Diagnosis not present

## 2010-11-06 DIAGNOSIS — I739 Peripheral vascular disease, unspecified: Secondary | ICD-10-CM

## 2010-11-06 DIAGNOSIS — I200 Unstable angina: Secondary | ICD-10-CM | POA: Diagnosis present

## 2010-11-06 DIAGNOSIS — Z79899 Other long term (current) drug therapy: Secondary | ICD-10-CM

## 2010-11-06 DIAGNOSIS — J449 Chronic obstructive pulmonary disease, unspecified: Secondary | ICD-10-CM | POA: Diagnosis present

## 2010-11-06 LAB — CBC
HCT: 41.2 % (ref 36.0–46.0)
Hemoglobin: 13.6 g/dL (ref 12.0–15.0)
MCH: 30.7 pg (ref 26.0–34.0)
MCHC: 33 g/dL (ref 30.0–36.0)
MCV: 93 fL (ref 78.0–100.0)
Platelets: 181 K/uL (ref 150–400)
RBC: 4.43 MIL/uL (ref 3.87–5.11)
RDW: 13.6 % (ref 11.5–15.5)
WBC: 6.3 K/uL (ref 4.0–10.5)

## 2010-11-06 LAB — BASIC METABOLIC PANEL WITH GFR
BUN: 9 mg/dL (ref 6–23)
CO2: 25 meq/L (ref 19–32)
Calcium: 9.4 mg/dL (ref 8.4–10.5)
Chloride: 110 meq/L (ref 96–112)
Creatinine, Ser: 0.68 mg/dL (ref 0.4–1.2)
GFR calc non Af Amer: >60 mL/min
Glucose, Bld: 95 mg/dL (ref 70–99)
Potassium: 4.6 meq/L (ref 3.5–5.1)
Sodium: 143 meq/L (ref 135–145)

## 2010-11-06 LAB — CARDIAC PANEL(CRET KIN+CKTOT+MB+TROPI)
CK, MB: 3.9 ng/mL (ref 0.3–4.0)
Relative Index: 1.8 (ref 0.0–2.5)

## 2010-11-06 LAB — MRSA PCR SCREENING: MRSA by PCR: NEGATIVE

## 2010-11-06 LAB — PROTIME-INR: Prothrombin Time: 12.3 seconds (ref 11.6–15.2)

## 2010-11-06 NOTE — Telephone Encounter (Signed)
I spoke with the pt's sister and made her aware that the pt was approved for Plavix assistance program through [employer]. Plavix arrived in the office and placed at the front desk for pick-up. Order #[id number], Customer PO #[id number].

## 2010-11-07 ENCOUNTER — Observation Stay (HOSPITAL_COMMUNITY): Payer: Self-pay

## 2010-11-07 ENCOUNTER — Telehealth: Payer: Self-pay | Admitting: Cardiology

## 2010-11-07 DIAGNOSIS — I2510 Atherosclerotic heart disease of native coronary artery without angina pectoris: Secondary | ICD-10-CM

## 2010-11-07 DIAGNOSIS — Z01810 Encounter for preprocedural cardiovascular examination: Secondary | ICD-10-CM

## 2010-11-07 DIAGNOSIS — I200 Unstable angina: Secondary | ICD-10-CM

## 2010-11-07 LAB — CARDIAC PANEL(CRET KIN+CKTOT+MB+TROPI)
CK, MB: 2.1 ng/mL (ref 0.3–4.0)
Total CK: 100 U/L (ref 7–177)
Total CK: 97 U/L (ref 7–177)
Troponin I: 0.09 ng/mL — ABNORMAL HIGH (ref 0.00–0.06)

## 2010-11-07 LAB — BASIC METABOLIC PANEL
BUN: 7 mg/dL (ref 6–23)
CO2: 27 mEq/L (ref 19–32)
GFR calc non Af Amer: >60 mL/min (ref >60)
Glucose, Bld: 94 mg/dL (ref 70–99)
Potassium: 4.3 mEq/L (ref 3.5–5.1)
Sodium: 140 mEq/L (ref 135–145)

## 2010-11-07 LAB — PLATELET INHIBITION P2Y12
Platelet Function  P2Y12: 287 [PRU] (ref 194–418)
Platelet Function Baseline: 301 [PRU] (ref 194–418)

## 2010-11-07 LAB — PULMONARY FUNCTION TEST

## 2010-11-07 NOTE — Telephone Encounter (Signed)
Doppler/Lower faxed to [place] @ [facility] to [phone] 11/07/10/KM

## 2010-11-08 ENCOUNTER — Inpatient Hospital Stay (HOSPITAL_COMMUNITY): Payer: Self-pay

## 2010-11-08 DIAGNOSIS — I2510 Atherosclerotic heart disease of native coronary artery without angina pectoris: Secondary | ICD-10-CM

## 2010-11-08 LAB — POCT I-STAT 3, ART BLOOD GAS (G3+)
Acid-Base Excess: 1 mmol/L (ref 0.0–2.0)
Acid-Base Excess: 1 mmol/L (ref 0.0–2.0)
Acid-base deficit: 3 mmol/L — ABNORMAL HIGH (ref 0.0–2.0)
Bicarbonate: 25 meq/L — ABNORMAL HIGH (ref 20.0–24.0)
Bicarbonate: 26.2 meq/L — ABNORMAL HIGH (ref 20.0–24.0)
Bicarbonate: 27.7 meq/L — ABNORMAL HIGH (ref 20.0–24.0)
O2 Saturation: 100 %
O2 Saturation: 100 %
O2 Saturation: 98 %
O2 Saturation: 99 %
O2 Saturation: 98 %
Patient temperature: 35.5
Patient temperature: 37.8
TCO2: 26 mmol/L (ref 0–100)
TCO2: 28 mmol/L (ref 0–100)
TCO2: 29 mmol/L (ref 0–100)
pCO2 arterial: 42.6 mmHg (ref 35.0–45.0)
pCO2 arterial: 42.1 mmHg (ref 35.0–45.0)
pCO2 arterial: 48.2 mmHg — ABNORMAL HIGH (ref 35.0–45.0)
pCO2 arterial: 56.5 mmHg — ABNORMAL HIGH (ref 35.0–45.0)
pCO2 arterial: 50 mmHg — ABNORMAL HIGH (ref 35.0–45.0)
pH, Arterial: 7.378 (ref 7.350–7.400)
pH, Arterial: 7.396 (ref 7.350–7.400)
pH, Arterial: 7.328 — ABNORMAL LOW (ref 7.350–7.400)
pH, Arterial: 7.302 — ABNORMAL LOW (ref 7.350–7.400)
pO2, Arterial: 257 mmHg — ABNORMAL HIGH (ref 80.0–100.0)
pO2, Arterial: 104 mmHg — ABNORMAL HIGH (ref 80.0–100.0)
pO2, Arterial: 125 mmHg — ABNORMAL HIGH (ref 80.0–100.0)
pO2, Arterial: 179 mmHg — ABNORMAL HIGH (ref 80.0–100.0)

## 2010-11-08 LAB — URINALYSIS, MICROSCOPIC ONLY
Bilirubin Urine: NEGATIVE
Glucose, UA: NEGATIVE mg/dL
Hgb urine dipstick: NEGATIVE
Ketones, ur: NEGATIVE mg/dL
Leukocytes, UA: NEGATIVE
Nitrite: NEGATIVE
Protein, ur: NEGATIVE mg/dL
Specific Gravity, Urine: 1.014 (ref 1.005–1.030)
Urobilinogen, UA: 0.2 mg/dL (ref 0.0–1.0)
pH: 6 (ref 5.0–8.0)

## 2010-11-08 LAB — CBC
HCT: 36.5 % (ref 36.0–46.0)
HCT: 33.3 % — ABNORMAL LOW (ref 36.0–46.0)
Hemoglobin: 12.4 g/dL (ref 12.0–15.0)
Hemoglobin: 11.3 g/dL — ABNORMAL LOW (ref 12.0–15.0)
MCH: 30.4 pg (ref 26.0–34.0)
MCH: 30.5 pg (ref 26.0–34.0)
MCHC: 34 g/dL (ref 30.0–36.0)
MCHC: 33.9 g/dL (ref 30.0–36.0)
MCV: 89.5 fL (ref 78.0–100.0)
MCV: 90 fL (ref 78.0–100.0)
Platelets: 111 10*3/uL — ABNORMAL LOW (ref 150–400)
Platelets: 106 10*3/uL — ABNORMAL LOW (ref 150–400)
RBC: 4.08 MIL/uL (ref 3.87–5.11)
RBC: 3.7 MIL/uL — ABNORMAL LOW (ref 3.87–5.11)
RDW: 14 % (ref 11.5–15.5)
RDW: 14.3 % (ref 11.5–15.5)
WBC: 17.2 10*3/uL — ABNORMAL HIGH (ref 4.0–10.5)
WBC: 13.3 10*3/uL — ABNORMAL HIGH (ref 4.0–10.5)

## 2010-11-08 LAB — POCT I-STAT, CHEM 8
BUN: 7 mg/dL (ref 6–23)
Calcium, Ion: 1.18 mmol/L (ref 1.12–1.32)
Chloride: 107 meq/L (ref 96–112)
Creatinine, Ser: 0.9 mg/dL (ref 0.4–1.2)
Glucose, Bld: 132 mg/dL — ABNORMAL HIGH (ref 70–99)
HCT: 33 % — ABNORMAL LOW (ref 36.0–46.0)
Hemoglobin: 11.2 g/dL — ABNORMAL LOW (ref 12.0–15.0)
Potassium: 4.9 meq/L (ref 3.5–5.1)
Sodium: 142 meq/L (ref 135–145)
TCO2: 27 mmol/L (ref 0–100)

## 2010-11-08 LAB — COMPREHENSIVE METABOLIC PANEL
ALT: 38 U/L — ABNORMAL HIGH (ref 0–35)
AST: 26 U/L (ref 0–37)
Albumin: 3.3 g/dL — ABNORMAL LOW (ref 3.5–5.2)
Alkaline Phosphatase: 65 U/L (ref 39–117)
BUN: 11 mg/dL (ref 6–23)
CO2: 25 mEq/L (ref 19–32)
Calcium: 8.9 mg/dL (ref 8.4–10.5)
Chloride: 106 mEq/L (ref 96–112)
Creatinine, Ser: 0.81 mg/dL (ref 0.4–1.2)
GFR calc Af Amer: >60 mL/min (ref >60)
GFR calc non Af Amer: >60 mL/min (ref >60)
Glucose, Bld: 99 mg/dL (ref 70–99)
Potassium: 4.2 mEq/L (ref 3.5–5.1)
Sodium: 138 mEq/L (ref 135–145)
Total Bilirubin: 0.4 mg/dL (ref 0.3–1.2)
Total Protein: 5.9 g/dL — ABNORMAL LOW (ref 6.0–8.3)

## 2010-11-08 LAB — GLUCOSE, CAPILLARY
Glucose-Capillary: 47 mg/dL — ABNORMAL LOW (ref 70–99)
Glucose-Capillary: 42 mg/dL — CL (ref 70–99)
Glucose-Capillary: 46 mg/dL — ABNORMAL LOW (ref 70–99)
Glucose-Capillary: 125 mg/dL — ABNORMAL HIGH (ref 70–99)
Glucose-Capillary: 139 mg/dL — ABNORMAL HIGH (ref 70–99)

## 2010-11-08 LAB — PROTIME-INR
INR: 1.25 (ref 0.00–1.49)
Prothrombin Time: 15.9 seconds — ABNORMAL HIGH (ref 11.6–15.2)

## 2010-11-08 LAB — POCT I-STAT 4, (NA,K, GLUC, HGB,HCT)
Glucose, Bld: 32 mg/dL — CL (ref 70–99)
Glucose, Bld: 97 mg/dL (ref 70–99)
Glucose, Bld: 145 mg/dL — ABNORMAL HIGH (ref 70–99)
Glucose, Bld: 39 mg/dL — CL (ref 70–99)
Glucose, Bld: 53 mg/dL — ABNORMAL LOW (ref 70–99)
Glucose, Bld: 31 mg/dL — CL (ref 70–99)
HCT: 35 % — ABNORMAL LOW (ref 36.0–46.0)
HCT: 22 % — ABNORMAL LOW (ref 36.0–46.0)
HCT: 29 % — ABNORMAL LOW (ref 36.0–46.0)
HCT: 32 % — ABNORMAL LOW (ref 36.0–46.0)
HCT: 27 % — ABNORMAL LOW (ref 36.0–46.0)
HCT: 37 % (ref 36.0–46.0)
Hemoglobin: 11.9 g/dL — ABNORMAL LOW (ref 12.0–15.0)
Hemoglobin: 8.5 g/dL — ABNORMAL LOW (ref 12.0–15.0)
Hemoglobin: 10.9 g/dL — ABNORMAL LOW (ref 12.0–15.0)
Hemoglobin: 8.8 g/dL — ABNORMAL LOW (ref 12.0–15.0)
Hemoglobin: 9.2 g/dL — ABNORMAL LOW (ref 12.0–15.0)
Hemoglobin: 12.6 g/dL (ref 12.0–15.0)
Potassium: 2.8 mEq/L — ABNORMAL LOW (ref 3.5–5.1)
Potassium: 4.5 mEq/L (ref 3.5–5.1)
Potassium: 3.3 mEq/L — ABNORMAL LOW (ref 3.5–5.1)
Potassium: 3.5 meq/L (ref 3.5–5.1)
Potassium: 3.4 meq/L — ABNORMAL LOW (ref 3.5–5.1)
Sodium: 136 mEq/L (ref 135–145)
Sodium: 139 mEq/L (ref 135–145)
Sodium: 138 mEq/L (ref 135–145)
Sodium: 144 mEq/L (ref 135–145)
Sodium: 145 meq/L (ref 135–145)
Sodium: 144 meq/L (ref 135–145)

## 2010-11-08 LAB — POCT I-STAT GLUCOSE
Glucose, Bld: 247 mg/dL — ABNORMAL HIGH (ref 70–99)
Glucose, Bld: 66 mg/dL — ABNORMAL LOW (ref 70–99)
Glucose, Bld: 81 mg/dL (ref 70–99)
Operator id: 3342
Operator id: 3342
Operator id: 3402

## 2010-11-08 LAB — POCT I-STAT 3, VENOUS BLOOD GAS (G3P V)
Acid-base deficit: 4 mmol/L — ABNORMAL HIGH (ref 0.0–2.0)
Bicarbonate: 23.6 mEq/L (ref 20.0–24.0)
O2 Saturation: 84 %
TCO2: 25 mmol/L (ref 0–100)

## 2010-11-08 LAB — HEMOGLOBIN AND HEMATOCRIT, BLOOD
HCT: 30.6 % — ABNORMAL LOW (ref 36.0–46.0)
Hemoglobin: 10.5 g/dL — ABNORMAL LOW (ref 12.0–15.0)

## 2010-11-08 LAB — CREATININE, SERUM
Creatinine, Ser: 0.72 mg/dL (ref 0.4–1.2)
GFR calc non Af Amer: >60 mL/min

## 2010-11-08 LAB — APTT
aPTT: 94 seconds — ABNORMAL HIGH (ref 24–37)
aPTT: 30 s (ref 24–37)

## 2010-11-08 LAB — MAGNESIUM: Magnesium: 3.3 mg/dL — ABNORMAL HIGH (ref 1.5–2.5)

## 2010-11-09 ENCOUNTER — Inpatient Hospital Stay (HOSPITAL_COMMUNITY): Payer: Self-pay

## 2010-11-09 LAB — POCT I-STAT 3, ART BLOOD GAS (G3+)
O2 Saturation: 98 %
TCO2: 28 mmol/L (ref 0–100)
pCO2 arterial: 46.3 mmHg — ABNORMAL HIGH (ref 35.0–45.0)
pO2, Arterial: 104 mmHg — ABNORMAL HIGH (ref 80.0–100.0)

## 2010-11-09 LAB — CBC
HCT: 33.1 % — ABNORMAL LOW (ref 36.0–46.0)
HCT: 34.9 % — ABNORMAL LOW (ref 36.0–46.0)
Hemoglobin: 10.9 g/dL — ABNORMAL LOW (ref 12.0–15.0)
Hemoglobin: 11.4 g/dL — ABNORMAL LOW (ref 12.0–15.0)
MCH: 29.6 pg (ref 26.0–34.0)
MCH: 30.1 pg (ref 26.0–34.0)
MCHC: 32.9 g/dL (ref 30.0–36.0)
MCHC: 32.7 g/dL (ref 30.0–36.0)
MCV: 89.9 fL (ref 78.0–100.0)
MCV: 92.1 fL (ref 78.0–100.0)
Platelets: 90 10*3/uL — ABNORMAL LOW (ref 150–400)
Platelets: 89 10*3/uL — ABNORMAL LOW (ref 150–400)
RBC: 3.68 MIL/uL — ABNORMAL LOW (ref 3.87–5.11)
RBC: 3.79 MIL/uL — ABNORMAL LOW (ref 3.87–5.11)
RDW: 14.3 % (ref 11.5–15.5)
RDW: 14.4 % (ref 11.5–15.5)
WBC: 12.9 10*3/uL — ABNORMAL HIGH (ref 4.0–10.5)
WBC: 15.3 10*3/uL — ABNORMAL HIGH (ref 4.0–10.5)

## 2010-11-09 LAB — POCT I-STAT, CHEM 8
Glucose, Bld: 167 mg/dL — ABNORMAL HIGH (ref 70–99)
HCT: 16 % — ABNORMAL LOW (ref 36.0–46.0)
Hemoglobin: 5.4 g/dL — CL (ref 12.0–15.0)
Potassium: 4.3 mEq/L (ref 3.5–5.1)
Sodium: 135 mEq/L (ref 135–145)

## 2010-11-09 LAB — BASIC METABOLIC PANEL
BUN: 6 mg/dL (ref 6–23)
CO2: 26 mEq/L (ref 19–32)
Calcium: 8.1 mg/dL — ABNORMAL LOW (ref 8.4–10.5)
Chloride: 107 mEq/L (ref 96–112)
Creatinine, Ser: 0.65 mg/dL (ref 0.4–1.2)
GFR calc Af Amer: >60 mL/min (ref >60)
GFR calc non Af Amer: >60 mL/min (ref >60)
Glucose, Bld: 130 mg/dL — ABNORMAL HIGH (ref 70–99)
Potassium: 4.5 mEq/L (ref 3.5–5.1)
Sodium: 139 mEq/L (ref 135–145)

## 2010-11-09 LAB — GLUCOSE, CAPILLARY
Glucose-Capillary: 134 mg/dL — ABNORMAL HIGH (ref 70–99)
Glucose-Capillary: 176 mg/dL — ABNORMAL HIGH (ref 70–99)

## 2010-11-09 LAB — CREATININE, SERUM
Creatinine, Ser: 0.85 mg/dL (ref 0.4–1.2)
GFR calc Af Amer: >60 mL/min (ref >60)
GFR calc non Af Amer: >60 mL/min (ref >60)

## 2010-11-09 LAB — PREPARE PLATELETS: Unit division: 0

## 2010-11-09 LAB — MAGNESIUM
Magnesium: 2.5 mg/dL (ref 1.5–2.5)
Magnesium: 2 mg/dL (ref 1.5–2.5)

## 2010-11-09 LAB — PREPARE FRESH FROZEN PLASMA: Unit division: 0

## 2010-11-10 ENCOUNTER — Inpatient Hospital Stay (HOSPITAL_COMMUNITY): Payer: Self-pay

## 2010-11-10 LAB — COMPREHENSIVE METABOLIC PANEL
ALT: 46 U/L — ABNORMAL HIGH (ref 0–35)
AST: 40 U/L — ABNORMAL HIGH (ref 0–37)
Albumin: 2.8 g/dL — ABNORMAL LOW (ref 3.5–5.2)
Alkaline Phosphatase: 53 U/L (ref 39–117)
BUN: 7 mg/dL (ref 6–23)
CO2: 30 mEq/L (ref 19–32)
Calcium: 8.8 mg/dL (ref 8.4–10.5)
Chloride: 101 mEq/L (ref 96–112)
Creatinine, Ser: 0.74 mg/dL (ref 0.4–1.2)
GFR calc Af Amer: >60 mL/min (ref >60)
GFR calc non Af Amer: >60 mL/min (ref >60)
Glucose, Bld: 127 mg/dL — ABNORMAL HIGH (ref 70–99)
Potassium: 4.3 mEq/L (ref 3.5–5.1)
Sodium: 136 mEq/L (ref 135–145)
Total Bilirubin: 0.6 mg/dL (ref 0.3–1.2)
Total Protein: 5.4 g/dL — ABNORMAL LOW (ref 6.0–8.3)

## 2010-11-10 LAB — CROSSMATCH
ABO/RH(D): B POS
Unit division: 0
Unit division: 0

## 2010-11-10 LAB — CBC
HCT: 31.6 % — ABNORMAL LOW (ref 36.0–46.0)
Hemoglobin: 10.5 g/dL — ABNORMAL LOW (ref 12.0–15.0)
MCH: 30.6 pg (ref 26.0–34.0)
MCHC: 33.2 g/dL (ref 30.0–36.0)
MCV: 92.1 fL (ref 78.0–100.0)
Platelets: 93 10*3/uL — ABNORMAL LOW (ref 150–400)
RBC: 3.43 MIL/uL — ABNORMAL LOW (ref 3.87–5.11)
RDW: 14.2 % (ref 11.5–15.5)
WBC: 15.1 10*3/uL — ABNORMAL HIGH (ref 4.0–10.5)

## 2010-11-11 ENCOUNTER — Inpatient Hospital Stay (HOSPITAL_COMMUNITY): Payer: Self-pay

## 2010-11-11 LAB — CBC
HCT: 30.5 % — ABNORMAL LOW (ref 36.0–46.0)
Hemoglobin: 9.9 g/dL — ABNORMAL LOW (ref 12.0–15.0)
MCH: 29.8 pg (ref 26.0–34.0)
MCHC: 32.5 g/dL (ref 30.0–36.0)
MCV: 91.9 fL (ref 78.0–100.0)
Platelets: 92 10*3/uL — ABNORMAL LOW (ref 150–400)
RBC: 3.32 MIL/uL — ABNORMAL LOW (ref 3.87–5.11)
RDW: 13.8 % (ref 11.5–15.5)
WBC: 11.6 10*3/uL — ABNORMAL HIGH (ref 4.0–10.5)

## 2010-11-11 LAB — BASIC METABOLIC PANEL
BUN: 8 mg/dL (ref 6–23)
CO2: 31 mEq/L (ref 19–32)
Calcium: 8.6 mg/dL (ref 8.4–10.5)
Chloride: 98 mEq/L (ref 96–112)
Creatinine, Ser: 0.69 mg/dL (ref 0.4–1.2)
GFR calc Af Amer: >60 mL/min (ref >60)
GFR calc non Af Amer: >60 mL/min (ref >60)
Glucose, Bld: 131 mg/dL — ABNORMAL HIGH (ref 70–99)
Potassium: 3.6 mEq/L (ref 3.5–5.1)
Sodium: 137 mEq/L (ref 135–145)

## 2010-11-12 ENCOUNTER — Inpatient Hospital Stay (HOSPITAL_COMMUNITY): Payer: Self-pay

## 2010-11-12 LAB — CBC
HCT: 29.3 % — ABNORMAL LOW (ref 36.0–46.0)
Hemoglobin: 9.6 g/dL — ABNORMAL LOW (ref 12.0–15.0)
MCH: 30.4 pg (ref 26.0–34.0)
MCHC: 32.8 g/dL (ref 30.0–36.0)
MCV: 92.7 fL (ref 78.0–100.0)
Platelets: 125 10*3/uL — ABNORMAL LOW (ref 150–400)
RBC: 3.16 MIL/uL — ABNORMAL LOW (ref 3.87–5.11)
RDW: 13.6 % (ref 11.5–15.5)
WBC: 8.6 10*3/uL (ref 4.0–10.5)

## 2010-11-12 NOTE — Consult Note (Signed)
[name], [name] NO.:  [id number]  MEDICAL RECORD NO.:  [id number]           PATIENT TYPE:  O  LOCATION:  3313                         FACILITY:  [facility]  PHYSICIAN:  Kerin Perna, M.D.  DATE OF BIRTH:  21-Oct-1960  DATE OF CONSULTATION:  11/06/2010 DATE OF DISCHARGE:                                CONSULTATION   PHYSICIAN REQUESTING CONSULTATION:  Veverly Fells. Excell Seltzer, MD  PRIMARY CARDIOLOGIST:  Antonieta Iba, MD  REASON FOR CONSULTATION:  Severe multivessel coronary artery disease with unstable angina.  CHIEF COMPLAINT:  Chest pain.  HISTORY OF PRESENT ILLNESS:  I was asked to evaluate this 50 year old African American female ex-smoker for possible multivessel bypass grafting after being recently diagnosed with severe multivessel coronary artery disease.  The patient's coronary artery disease dates back to 2003 when she had a stent placed in her proximal circumflex at [facility].  She returned in January 2012 with recurrent chest pain and cardiac cath by Dr. [name] demonstrated high-grade proximal LAD stenosis and stenosis of a diagonal branch as well.  The stent to the circumflex was patent, but there was some proximal disease noted.  The right coronary artery was nondominant and had 80% stenosis.  EF was approximately 45%.  At that time, she underwent a bare metal PCI stent to the proximal LAD and did well and was placed on Plavix therapy.  She stopped smoking at that time as well.  Recently she has had recurrent chest pain as well as new claudication in her right leg.  Noninvasive studies have shown a significant stenosis of the right superficial femoral artery.  A 2-D echo was performed, which showed EF of 45% without significant valvular disease.  Because of recurrent chest pains, at current a repeat cath was recommended by Dr. Excell Seltzer, which was performed today.  This shows new proximal LAD stenosis in the region of the  previously placed stent of 80%.  The diagonal branch of the LAD has a 90% stenosis and the proximal circumflex also has an 80% stenosis before the patent stent.  The right coronary artery is small nondominant with an 80% stenosis.  Ventriculogram shows EF of approximately 40% with LVEDP of 24 mmHg.  Based on her in-stent stenosis of the recently placed LAD bare metal stent, surgical revascularization was recommended.  Her Plavix has been stopped and a platelet function assay (P2TY12) is ordered for tomorrow.  She will be admitted to the hospital until her surgery as she developed chest pain following the procedure and her cardiac enzymes are mildly elevated.  PAST MEDICAL HISTORY: 1. Hypertension. 2. Dyslipidemia. 3. COPD.  HOME MEDICATIONS: 1. Aspirin 325. 2. Plavix 75 mg. 3. Coreg 6.25 b.i.d. 4. Pletal 100 mg b.i.d. 5. Imdur 30 mg daily. 6. Zestril 5 mg daily. 7. Zocor 40 mg daily.  ALLERGIES:  LATEX causes a rash and she is allergic to SULFA.  SOCIAL HISTORY:  The patient works in business facilities.  She stopped smoking in January.  She lives in [place].  She drinks alcohol occasionally.  FAMILY HISTORY:  Positive for hypertension.  Negative for premature coronary artery  disease.  REVIEW OF SYSTEMS:  CONSTITUTIONAL:  Negative for fever or weight loss or night sweats.  ENT:  Negative for change in vision or difficulty swallowing.  She has some broken teeth in her right maxilla posteriorly. THORACIC:  Negative for recent symptoms of upper respiratory infection. Recent chest x-ray at [facility] is notable for evidence of COPD, but no active airspace disease, pulmonary edema, or pleural effusion. She denies history of previous thoracic trauma, pneumothorax, or rib fractures.  CARDIAC:  Positive for progressive coronary artery disease with unstable angina and mildly positive cardiac enzymes at this time. EF is reduced at 40%.  GI:  Negative for hepatitis,  jaundice, blood per rectum.  UROLOGIC:  Negative for kidney stones or hematuria.  ENDOCRINE: Negative for diabetes or thyroid disease.  VASCULAR:  Positive for claudication in the right leg with 80% left superficial femoral artery stenosis.  No history of varicose veins or DVT.  NEUROLOGIC:  Negative for stroke or seizure.  Nitroglycerin gives her severe headache.  PHYSICAL EXAMINATION:  VITAL SIGNS:  The patient is 5 feet 5 inches, weighs 185 pounds, blood pressure 140/90, pulse 70 sinus. GENERAL:  She is alert and oriented in the cath lab holding area.  No acute distress. HEENT:  Normocephalic. NECK:  Without JVD, mass, or bruit. LYMPHATICS:  No palpable supraclavicular or cervical adenopathy. LUNGS:  Breath sounds are with scattered rhonchi, but without wheeze. CARDIAC:  Regular rate and rhythm without S3 gallop or murmur. ABDOMEN:  Soft, nontender without pulsatile mass. EXTREMITIES:  No clubbing, cyanosis, or edema.  She has compression dressing in her groin from the cardiac cath.  Peripheral pulses are diminished in the pedal area.  Otherwise 2+ in the radial and femoral. NEUROLOGIC:  Alert and oriented without focal motor deficits.  LABORATORY DATA:  Reviewed the coronary arteriogram with Dr. Excell Seltzer and agree with his impression of severe multivessel disease with in-stent stenosis of the recently placed LAD bare metal stent.  RECOMMENDATIONS:  The patient will be scheduled and prepared for multivessel bypass grafting later in the week after Plavix washout and a P2TY 12 assay is checked.  We also started her on some pulmonary physiotherapy and bronchodilator therapy for COPD and checked a hemoglobin A1c and MRSA nasal screen.  Procedure was discussed with the patient and her sister and she understands and agrees on.     Kerin Perna, M.D.     PV/MEDQ  D:  11/06/2010  T:  11/07/2010  Job:  [id number]  cc:   Antonieta Iba, MD Veverly Fells. Excell Seltzer, MD  Electronically  Signed by Kerin Perna M.D. on 11/12/2010 06:48:36 PM

## 2010-11-12 NOTE — Op Note (Signed)
Lorraine [name] [name], Lorraine [name] NO.:  [id number]  MEDICAL RECORD NO.:  [id number]           PATIENT TYPE:  I  LOCATION:  2311                         FACILITY:  [facility]  PHYSICIAN:  Lorraine [name] [name], M.D.  DATE OF BIRTH:  July 26, 1961  DATE OF PROCEDURE: DATE OF DISCHARGE:                              OPERATIVE REPORT   OPERATION: 1. Coronary bypass grafting x4 (left internal mammary artery to left     anterior descending, saphenous vein graft to diagonal, saphenous     vein graft to circumflex marginal, saphenous vein graft to right     coronary artery). 2. Endoscopic harvest of left leg greater saphenous vein. 3. Placement of left femoral A-line for blood pressure monitoring.  SURGEON:  Lorraine Perna, MD  ASSISTANT:  Doree Fudge, PA-C  PREOPERATIVE DIAGNOSIS:  Severe three-vessel coronary disease with unstable angina, status post prior percutaneous coronary intervention with in-stent stenosis.  POSTOPERATIVE DIAGNOSIS:  Severe three-vessel coronary disease with unstable angina, status post prior percutaneous coronary intervention with in-stent stenosis.  ANESTHESIA:  General by Quita Skye Krista Blue, MD.  INDICATIONS AND CLINICAL NOTE:  The patient is a 50 year old African American female, hypertensive, smoker, who has undergone previous percutaneous intervention to the proximal circumflex and proximal LAD at [facility].  She returned with recurrent angina of an unstable type and was brought for repeat cath.  At the time of cath, she had distal left main stenosis involving high-grade stenosis of the ostium of the LAD and circumflex of approximately 90%.  The stents were patent beyond the ostial lesions.  A diagonal branch of the LAD had a 90% stenosis and the nondominant right coronary had an 80% proximal stenosis.  Overall EF was 45%.  She developed chest pain following cath and was admitted to the hospital and placed on IV heparin.  It was  felt she would be a candidate for surgical revascularization due to her progressive disease, despite previous percutaneous intervention therapy.  Prior to surgery, I examined the patient in her CCU room and reviewed the results of the cardiac cath with the patient and family.  I discussed the indications and expected benefits of multivessel bypass grafting for treatment of her severe three-vessel coronary artery disease.  Reviewed the alternatives to surgical therapy as well.  I discussed the major issues of surgery including location of the surgical incisions, use of general anesthesia and cardiopulmonary bypass, and expected postoperative hospital recovery.  I discussed with her the risks of the surgery as well including risks of bleeding, blood transfusion requirement, stroke, MI, infection, prolonged ventilator dependence, and death.  She did show evidence of taking Plavix and she understood there was an increased risk of bleeding.  After reviewing all these issues, she agreed to proceed with surgery under what I felt was an informed consent.  OPERATIVE FINDINGS:  The coronaries were severely diffusely diseased with diffuse plaquing calcification.  The conduit was adequate, but the vein was somewhat small and friable.  The patient was anemic certainly after going on bypass and required 2 units of packed cells for a hemoglobin less than 7.  She had a  coagulopathy after reversal of heparin at the termination of bypass and was given platelets with improved coagulation function.  PROCEDURE:  The patient was brought to operative room and placed supine on the operating room table where general anesthesia was induced.  The chest, abdomen, and legs were prepped with Betadine and draped as a sterile field.  A transesophageal 2-D echo probe was placed by the anesthesiologist.  This confirmed preoperative diagnosis of mild global LV dysfunction without significant valvular disease.  The  sternal incision was made and the saphenous vein was harvested endoscopically from the left leg.  The left internal mammary artery was harvested as a pedicle graft from its origin at the subclavian vessels.  It was a proximal 1.5 mm in diameter.  The sternal retractor was placed and the pericardium was opened and suspended.  Pursestrings were placed in the right atrium and the ascending aorta, and after the vein was harvested and found to be adequate, the patient was heparinized and the ACT was documented as being therapeutic.  The patient was then cannulated and placed on cardiopulmonary bypass.  The coronaries were identified for grafting.  The mammary artery and vein grafts were prepared for the distal anastomoses.  Cardioplegia catheters were placed for antegrade cardioplegia.  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  The patient was cooled at 32 degrees and aortic crossclamp was applied, 800 mL of cold blood cardioplegia was delivered to the aorta and there was a good cardioplegic arrest with septal temperature dropping less than 12 degrees.  Cardioplegia was delivered every 20 minutes or less while the crossclamp was in place.  The distal coronary anastomosis was then performed.  First distal anastomosis was to the distal right.  There was a 1.2-mm vessel with proximal 80% stenosis.  Reverse saphenous vein was sewn end-to-side with running 7-0 Prolene with good flow through the graft.  The second distal anastomosis was in the circumflex marginal.  This is an intramyocardial vessel 1.7 mm with proximal 90% stenosis.  Reverse saphenous vein was sewn end-to-side with running 7-0 Prolene with good flow through the graft.  The third distal anastomosis was to the diagonal branch of the LAD.  It was a 1.4-mm vessel with proximal 90% stenosis.  A reverse saphenous vein was sewn end-to-side with running 7-0 Prolene with good flow through  the graft.  Cardioplegia was redosed.  The fourth distal anastomosis was then placed in the mid LAD, it had diffuse plaque, but was 1.5-mm vessel.  The left IMA pedicle was brought through an opening created in the left lateral pericardium, was brought down onto the LAD and sewn end-to-side with running 8-0 Prolene.  There was good flow through the anastomosis after briefly releasing the pedicle bulldog on the mammary artery.  The bulldog was reapplied and the mammary pedicle was secured to epicardium with 6-0 Prolene.  Cardioplegia was redosed.  While the crossclamp was still in place, three proximal vein anastomoses were performed on the ascending aorta using a 4.0-mm punch with running 7-0 Prolene.  Prior to tying down the final proximal anastomosis, air was vented from the coronaries by releasing the mammary artery and filling the heart with volume.  The final proximal anastomosis was tied and the crossclamp was removed.  The heart was cardioverted back to regular rate and rhythm.  Air was aspirated from the vein grafts with a 27-gauge needle.  The grafts were checked and found to have good flow, and  hemostasis was documented at the proximal and distal anastomoses.  The patient was rewarmed at 37 degrees and the cardioplegia cannulas were removed.  Temporary pacing wires were applied.  The lungs re-expanded and ventilator was resumed. The patient was then weaned off bypass without difficulty and did not require any inotropes.  Blood pressure and cardiac output were stable and global LV function was preserved.  Protamine was administered without adverse reaction and the cannulas were removed.  The mediastinum was irrigated and the pericardium was closed superiorly.  Two mediastinal and left pleural chest tube were placed and brought through separate incisions.  The sternum was closed with interrupted steel wire. The pectoralis fascia was closed in running #1 Vicryl and  the subcutaneous and skin were closed with a running Vicryl.  It should be noted that a right femoral A-line was placed after the patient was prepped before moving on for bypass due to a dampened radial A-line and this was needed for blood pressure monitoring.  The patient returned to the ICU in stable condition.  Total cardiopulmonary bypass time was 117 minutes.     Lorraine [name] [name], M.D.     PV/MEDQ  D:  11/08/2010  T:  11/09/2010  Job:  [id number]  cc:   Veverly Fells. Excell Seltzer, MD Antonieta Iba, MD  Electronically Signed by Lorraine [name] [name] M.D. on 11/12/2010 06:48:38 PM

## 2010-11-19 ENCOUNTER — Encounter: Payer: Self-pay | Admitting: Cardiovascular Disease

## 2010-11-19 NOTE — Discharge Summary (Signed)
[name] NO.:  [id number]  MEDICAL RECORD NO.:  [id number]           PATIENT TYPE:  I  LOCATION:  2018                         FACILITY:  [facility]  PHYSICIAN:  Kerin Perna, M.D.  DATE OF BIRTH:  [birth year]  DATE OF ADMISSION:  11/06/2010 DATE OF DISCHARGE:  11/13/2010                              DISCHARGE SUMMARY   ADMITTING DIAGNOSES: 1. History of coronary artery disease (status post multiple PCIs and stents,     ejection fraction of 45-50%). 2. History of hypertension. 3. History of dyslipidemia. 4. History of chronic obstructive pulmonary disease. 5. History of tobacco abuse. 6. History of right PAD.  DISCHARGE DIAGNOSES: 1. History of coronary artery disease (status post multiple PCIs and stents,     ejection fraction of 45-50%). 2. History of hypertension. 3. History of dyslipidemia. 4. History of chronic obstructive pulmonary disease. 5. History of tobacco abuse. 6. History of right PAD. 7. Postoperative thrombocytopenia. 8. Acute blood loss anemia.  PROCEDURES: 1. Cardiac catheterization performed on November 06, 2010 by Dr. Excell Seltzer. 2. CABG x4 (LIMA to LAD, SVG diagonal, SVG to circumflex marginal and     SVG to the right coronary artery that deviates from the left thigh     and calf as well as a placement of a left femoral A-line by Dr. Donata Clay on November 08, 2010.  HISTORY OF PRESENTING ILLNESS:  This is a 50 year old African American female with the aforementioned past medical history.  [name] coronary artery disease dates back in 2003.  At that time, [name] had a PCI with stent to the proximal circumflex at [name].  In January 2012, [name] presented with recurrent chest pain.  A cardiac catheterization done by Dr. [name] demonstrated high-grade proximal LAD stenosis and stenosis of a diagonal branch as well.  The stent to the circumflex was patent but there was some proximal disease noted.  Right coronary  artery was nondominant and had an approximate 80% stenosis and the EF was estimated to be 45%.  At that time, [name] then underwent a bare metal PCI stent to the proximal LAD and was placed on Plavix.  The patient also stopped smoking at that time as well.  Recently, [name] has had recurrent chest pain as well as new claudication of [name] right leg. Noninvasive studies show a significant stenosis of the right superficial femoral artery.        A 2-D echo done on November 08, 2010 showed an EF of 45% without significant valvular disease.  The patient underwent a cardiac catheterization with Dr. Excell Seltzer on November 06, 2010.  Results indicated a new proximal LAD stenosis in the region of the previously placed stent of approximately 80%, diagonal branch of the LAD had a 90% stenosis, proximal circumflex had an 80% stenosis before the patent stent, the right coronary artery was small dominant with an 80% stenosis, and an EF of 40%.  A cardiothoracic consultation was obtained with Dr. Donata Clay for consideration of coronary bypass grafting surgery.  As previously stated, the patient had been on Plavix which has had recently been stopped  and a platelet function assay was done.  [name] platelet function P2Y12 was 287.  [name] function baseline was 301 and [name] percent inhibition was 5%.  Based on these results, we did not have to wait for a Plavix washout.  Potential risks, complications, and benefits of the surgery discussed with the patient and [name] agreed to proceed.  Prior to undergoing the CABG x4, however, the patient underwent carotid duplex ultrasound which showed no significant internal carotid artery stenosis. The ABIs were obtained from the office of Dr. Excell Seltzer which had recently been done showed the right ABI to be 0.73 in the left with a greater than 50% focal stenosis of the distal right SFA and the left ABI was 1. The patient underwent CABG x4 by Dr. Donata Clay on November 08, 2010.  BRIEF HOSPITAL COURSE  STAY:  The patient was extubated without difficulty early evening of surgery.  He initially did require BiPAP, was able to be weaned off, and placed on oxygen via nasal cannula.  [name] remained afebrile and hemodynamically stable.  [name] Swan-Ganz, A-line, chest tubes, and Foley were all removed early in [name] postoperative course.  [name] was started on a low-dose beta-blocker as well as a low- dose ACE inhibitor.  [name] was found to be volume overload and diuresed accordingly.  [name] did have acute blood loss anemia.  [name] H&H went as low as 9.6 and 29.3.  [name] did not require postoperative transfusion.  [name] also had thrombocytopenia.  [name] platelet count went as low as 89,000. [name] last platelet count, however, was up to 125,000.  [name] was felt surgically stable for transfer from the Intensive Care to PCTU for further convalescence on November 10, 2010.  [name] continued to progress with cardiac rehab, currently on postop day #4.  [name] has been tolerating a diet.  [name] has had a bowel movement.  [name] has occasional incisional pain.  Otherwise [name] has no complaints.  [name] had T-max 99.6, later became afebrile.  [name] BP is 9/80, O2 sat 95%.  Preop weight 85 kg, today's weight down to 83 kg.  PHYSICAL EXAMINATION:  CARDIOVASCULAR:  Regular rate and rhythm.  On tele there are some PVCs. PULMONARY:  Slightly decreased at the right base, left lung clear. ABDOMEN:  Soft, nontender.  Bowel sounds present. EXTREMITIES:  Minor lower extremity edema, left greater than right.  [name] sternal and left lower extremity wounds are clean, dry, and continuing to heal.  Provided the patient mains afebrile, hemodynamically stable, and pending morning round evaluation, [name] will be surgically stable for discharge on November 13, 2010.  [name] epicardial pacing wires as well as [name] chest tube sutures will be removed prior to [name] discharge.  Latest laboratory studies are as follows.  CBC done on November 12, 2010, H&H 9.6 and 29.3, white count  8600, platelet count of 125,000.  Last BMET done on November 11, 2010, potassium 3.6 which has been supplemented and BUN and creatinine 8 and 0.69 respectively.  Last chest x-ray done on November 12, 2010.  The official report is not available, however, it appeared the patient has a small right pleural effusion, perhaps slightly improved from previous x-ray.  No pneumothorax and left lung is clear.  Discharge instructions include the following.  DIET:  The patient is to remain on a low-sodium heart-healthy diet.  ACTIVITY:  [name] may shower.  [name] may walk up steps.  [name] is not to lift more than 10 pounds for 4 weeks.  [name] is not to  drive until after 4 weeks.  [name] is to continue with [name] breathing exercise daily.  [name] is to walk daily and increase [name] frequency and duration as tolerated.  WOUND CARE:  The patient is to use soap and water on [name] wounds.  [name] is to contact the office if any wound problems arise.  FOLLOW UP APPOINTMENTS include: 1. The patient needs to contact Dr. Windell Hummingbird office for followup     appointment in [place] within 2 weeks. 2. The patient has appointment to see Dr. Donata Clay.  On May 9 at 9:30     a.m., 45 minutes prior to this office appointment, a chest x-ray     will be obtained. 3.  Patient will need to followup re PAD of the right lower extremity.  DISCHARGE MEDICATIONS: 1. Biotin 15 mL rinse twice daily. 2. Cilostazol 100 mg p.o. at bedtime. 3. Advair Diskus 250/50, 1 puff inhaled 2 times daily. 4. Lasix 40 mg p.o. daily x4 days. 5. Potassium chloride 20 mEq daily x4 days. 6. Robitussin DM 15 mL p.o. q.4 h. p.r.n. cough. 7. Lopressor 25 mg p.o. 2 times daily. 8. Oxycodone 5 mg 1-2 tablets every 4-6 hours as needed for pain. 9. Spiriva 18 mcg inhaled daily. 10.Enteric-coated aspirin 325 mg p.o. daily. 11.Calcium over-the-counter p.o. daily. 12.Lisinopril 5 mg p.o. daily. 13.Multivitamin p.o. daily. 14.Simvastatin 40 mg p.o. at bedtime. 15.Tums chewable  1-2 tablets p.o. q.4 h. p.r.n. 16.Visine over-the-counter 1 drop in both eyes daily p.r.n. dry eyes.     Doree Fudge, PA   ______________________________ Kerin Perna, M.D.    DZ/MEDQ  D:  11/12/2010  T:  11/12/2010  Job:  [id number]  cc:   Antonieta Iba, MD Veverly Fells. Excell Seltzer, MD Kerin Perna, M.D.  Electronically Signed by Doree Fudge PA on 11/13/2010 09:08:50 AM Electronically Signed by Kerin Perna M.D. on 11/19/2010 12:39:48 PM

## 2010-11-28 NOTE — Cardiovascular Report (Signed)
NAMETIJAH, HANE             ACCOUNT NO.:  [id number]  MEDICAL RECORD NO.:  [id number]           PATIENT TYPE:  O  LOCATION:  3313                         FACILITY:  [facility]  PHYSICIAN:  Veverly Fells. Excell Seltzer, MD  DATE OF BIRTH:  Oct 22, 1960  DATE OF PROCEDURE:  11/06/2010 DATE OF DISCHARGE:                           CARDIAC CATHETERIZATION   PROCEDURES: 1. Left heart catheterization. 2. Selective coronary angiography. 3. Left ventricular angiography. 4. Abdominal aortic angiography with runoff.  PROCEDURAL INDICATION:  Ms. Styles is a 50 year old woman with CAD.  She has undergone multiple PCI procedures.  Her most recent procedure was performed at [facility] where she underwent stenting of the proximal LAD.  The patient has continued to have class III anginal symptoms.  Her catheterization films were reviewed and she has significant high-grade diagonal stenosis involving a fairly large diagonal branch.  Her other vessels appeared patent, but with worsening angina we elected to proceed with cardiac catheterization.  The patient also has been noted to have lower extremity PAD and duplex ultrasound demonstrated severe stenosis in the right superficial femoral artery. The patient has lifestyle-limiting claudication.  We elected to proceed with lower extremity angiography and consideration for PTA for symptom relief.  Risks and indications of the procedure were reviewed with the patient. Informed consent was obtained.  Left groin was prepped, draped, and anesthetized with 1% lidocaine.  Using modified Seldinger technique, a 5- French sheath was placed in the left femoral artery.  Standard Judkins catheters were used for coronary angiography and left ventriculography. A pullback across the aortic valve was performed.  The pigtail catheter was pulled back into the suprarenal abdominal aorta where an abdominal aortogram was performed.  Catheter was then pulled  back into the distal aorta and bilateral lower extremity runoff was performed.  The patient tolerated procedure well.  There were immediate no complications.  Aortic pressure 149/77 with a mean of 103.  Left ventricular pressure is 150/23.  Left ventriculography shows mild global left ventricular hypokinesis. The estimated left ventricular ejection fraction is 45% to 50%.  CORONARY ANGIOGRAPHY:  The left mainstem is patent throughout the proximal and mid body of the left main.  There is distal left main disease involving the distal bifurcation into the LAD and left circumflex.  The predominant disease is in the LAD and left circumflex, ostia.  The distal left main appears to have some degree of stenosis as well.  LAD:  The LAD has severe ostial stenosis, estimated at 80%.  This involves the stented segment beyond the stent in the proximal LAD.  Mid LAD has 70% stenosis at the level of the first diagonal.  First diagonal is moderate to large in caliber and has 95% long segment complex stenosis.  There is no significant distal LAD disease.  Left circumflex.  The left circumflex developed progressive disease involving the ostium and proximal circumflex prior to the stented segment.  I would estimate the severe stenosis at 80%.  The mid circumflex has a stented segment that is patent without significant stenosis.  The circumflex is dominant, gives off left PDA and posterolateral branches.  Right  coronary artery.  The right coronary artery is small and nondominant and supplies an RV marginal branch.  There is a 75% mid RCA stenosis.  Abdominal aortogram:  This demonstrates a widely patent abdominal aorta with patent renal arteries bilaterally.  Lower extremity runoff:  The right leg has mild common iliac stenosis. There is no significant external iliacs stenosis.  The common femoral is patent.  Midway down to the superficial femoral artery, there is a focal 80% stenosis present.   There is no other significant stenosis identified and there is three-vessel runoff to the right foot.  On the left side, the common, internal, and external iliac arteries are all patent without significant stenosis.  The left superficial femoral artery is patent.  There is three-vessel runoff to the left foot.  FINAL ASSESSMENT: 1. Severe two-vessel coronary artery disease involving the origin of     the left anterior descending and left circumflex. 2. Severe right superficial femoral artery stenosis. 3. Mild left ventricular segmental dysfunction.  RECOMMENDATIONS:  I think with the patient's aggressive restenosis and proximal location of her coronary artery disease in both the LAD and left circumflex just beyond the distal left mainstem that she would be better off with coronary artery bypass surgery.  I discussed this in detail with the patient and her family.  We will arrange a consultation with [facility].  I would recommend treating her peripheral arterial disease medically and we can consider treatment of her SFA stenosis when she recovers from cardiac surgery.     Veverly Fells. Excell Seltzer, MD     MDC/MEDQ  D:  11/07/2010  T:  11/07/2010  Job:  [id number]  cc:   Antonieta Iba, MD  Electronically Signed by Tonny Bollman MD on 11/28/2010 10:38:22 AM

## 2010-12-03 ENCOUNTER — Other Ambulatory Visit: Payer: Self-pay | Admitting: Cardiothoracic Surgery

## 2010-12-03 DIAGNOSIS — I2510 Atherosclerotic heart disease of native coronary artery without angina pectoris: Secondary | ICD-10-CM

## 2010-12-04 ENCOUNTER — Ambulatory Visit
Admission: RE | Admit: 2010-12-04 | Discharge: 2010-12-04 | Disposition: A | Discharge status: Home / Self Care | Payer: Self-pay | Source: Ambulatory Visit | Attending: Cardiothoracic Surgery | Admitting: Cardiothoracic Surgery

## 2010-12-04 ENCOUNTER — Ambulatory Visit (INDEPENDENT_AMBULATORY_CARE_PROVIDER_SITE_OTHER): Payer: Self-pay | Admitting: Cardiothoracic Surgery

## 2010-12-04 DIAGNOSIS — I2510 Atherosclerotic heart disease of native coronary artery without angina pectoris: Secondary | ICD-10-CM

## 2010-12-05 NOTE — Assessment & Plan Note (Signed)
OFFICE VISIT  LISSET, KETCHEM DOB:  [birth year]                                        Dec 04, 2010 CHART #:  [id number]  CURRENT PROBLEMS: 1. Status post CABG x4 November 09, 2010. 2. Positive history of smoking with right lower lobe atelectasis and     effusion postoperatively. 3. Hypertension.  PRESENT ILLNESS:  The patient is a 50 year old female, reformed smoker returns for first office visit after multivessel bypass grafting in April when she presented with severe three-vessel disease with unstable angina.  At that time, she underwent left IMA graft to LAD, and vein grafts to the diagonal, circumflex marginal, and right coronary artery. Postoperatively, she remained in a sinus rhythm and was hemodynamically stable with EF of 45-50%.  She does have history of right SFA stenosis, so the vein was harvested from her left leg.  She was discharged home on postoperative day 4 and sinus rhythm off oxygen with her chest x-ray showing some atelectasis at the right base.  HOME MEDICATIONS: 1. Advair. 2. Short course of Lasix. 3. Potassium. 4. Lopressor 25 b.i.d. 5. Spiriva. 6. Lisinopril 5 mg. 7. Simvastatin. 8. Multivitamin. 9. Oxycodone.  Since returning home, she is slowly improving, gotten stronger.  She denies recurrent angina.  She has some congestion, productive cough, and some orthopnea.  Her incisions are healing well.  PHYSICAL EXAMINATION:  VITAL SIGNS:  Blood pressure is 118/80, pulse of 70, respirations 18, saturation 98%.  LUNGS:  Breath sounds are diminished slightly at the right base.  Her sternum is well healed. CARDIAC:  Rhythm is regular.  There is no murmur or rub.  EXTREMITIES: Her leg incision is healing well on the left.  There is no pedal edema.  PA and lateral chest x-ray shows some volume loss at the right base with probable atelectasis and combination of effusion.  The left lung is clear, and the sternal wire is  intact.  IMPRESSION AND PLAN:  The patient is having some pulmonary symptoms secondary underlying chronic obstructive pulmonary disease with postoperative atelectasis, effusion on the right hemithorax.  She will be given a course of oral Lasix and encouraged to increase her walking and incentive spirometry.  She knows not to resume smoking.  I will see her back in about 2 weeks to assess for possible thoracentesis if the Lasix is not successful.  Otherwise, she can resume driving and light activities.  She is not ready to return to work.  Kerin Perna, M.D. Electronically Signed  PV/MEDQ  D:  12/04/2010  T:  12/05/2010  Job:  [id number]  cc:   Antonieta Iba, MD

## 2010-12-16 ENCOUNTER — Ambulatory Visit (INDEPENDENT_AMBULATORY_CARE_PROVIDER_SITE_OTHER): Payer: Self-pay | Admitting: Cardiovascular Disease

## 2010-12-16 ENCOUNTER — Encounter: Payer: Self-pay | Admitting: Cardiovascular Disease

## 2010-12-16 DIAGNOSIS — Z87891 Personal history of nicotine dependence: Secondary | ICD-10-CM

## 2010-12-16 DIAGNOSIS — I739 Peripheral vascular disease, unspecified: Secondary | ICD-10-CM

## 2010-12-16 DIAGNOSIS — I70219 Atherosclerosis of native arteries of extremities with intermittent claudication, unspecified extremity: Secondary | ICD-10-CM

## 2010-12-16 DIAGNOSIS — Z951 Presence of aortocoronary bypass graft: Secondary | ICD-10-CM

## 2010-12-16 DIAGNOSIS — I2510 Atherosclerotic heart disease of native coronary artery without angina pectoris: Secondary | ICD-10-CM

## 2010-12-16 DIAGNOSIS — E785 Hyperlipidemia, unspecified: Secondary | ICD-10-CM

## 2010-12-16 HISTORY — DX: Personal history of nicotine dependence: Z87.891

## 2010-12-16 HISTORY — DX: Presence of aortocoronary bypass graft: Z95.1

## 2010-12-16 MED ORDER — SIMVASTATIN 40 MG PO TABS: 40.0000 mg | ORAL_TABLET | Freq: Every day | ORAL | Status: DC

## 2010-12-16 MED ORDER — LISINOPRIL 5 MG PO TABS: 5.0000 mg | ORAL_TABLET | Freq: Every day | ORAL | Status: DC

## 2010-12-16 NOTE — Assessment & Plan Note (Signed)
Continue current simvastatin 40 mg daily recheck her cholesterol in 2 months

## 2010-12-16 NOTE — Progress Notes (Signed)
   Patient ID: [name], female    DOB: [birth year], 50 y.o.   MRN: [id number]  HPI Comments: 50 yo with h/o CAD s/p recent NSTEMI at [facility]  1/12, with BMS to pLAD (80% stenosis), residual severe stenosis of the RCA which is a nondominant small vessel, With continued chest pain, repeat cardiac catheterization showing distal left main stenosis involving a high grade ostial LAD stenosis and circumflex disease approximately 90%, 90% diagonal disease, 80% nondominant RCA disease with ejection fraction 45%, who underwent bypass surgery x4 with a LIMA to the LAD, vein graft to the diagonal, vein graft to the marginal and vein graft to the RCA,  Who presents for routine followup.  Since her discharge one month ago, she has had some shortness of breath though this has improved on Lasix. She feels that she is slowly getting stronger, has less chest discomfort. 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 She denies any significant lower extremity edema.  Patient has quit smoking   ECG: NSR, rate of 77beats per minute, T Wave abnormality in leads V3 through V6, 2, 3, aVF   recent echocardiogram showing ejection fraction 45-50%   Lower extremity Doppler showing severe right distal SFA disease       Review of Systems  Constitutional: Negative.   HENT: Negative.   Eyes: Negative.   Respiratory: Positive for shortness of breath.   Cardiovascular: Positive for chest pain.  Gastrointestinal: Negative.   Musculoskeletal: Negative.   Skin: Negative.   Neurological: Negative.   Hematological: Negative.   Psychiatric/Behavioral: Negative.   All other systems reviewed and are negative.    BP 100/60  Pulse 77  Ht 5\' 6"  (1.676 m)  Wt 174 lb 6.4 oz (79.107 kg)  BMI 28.15 kg/m2   Physical Exam  Nursing note and vitals reviewed. Constitutional: She is oriented to person, place, and time. She appears well-developed and well-nourished.    HENT:  Head: Normocephalic.  Nose: Nose normal.  Mouth/Throat: Oropharynx is clear and moist.  Eyes: Conjunctivae are normal. Pupils are equal, round, and reactive to light.  Neck: Normal range of motion. Neck supple. No JVD present.  Cardiovascular: Normal rate, regular rhythm, S1 normal, S2 normal, normal heart sounds and intact distal pulses.  Exam reveals no gallop and no friction rub.   No murmur heard.      Well-healed mediastinal incision site  Pulmonary/Chest: Effort normal and breath sounds normal. No respiratory distress. She has no wheezes. She has no rales. She exhibits no tenderness.  Abdominal: Soft. Bowel sounds are normal. She exhibits no distension. There is no tenderness.  Musculoskeletal: Normal range of motion. She exhibits no edema and no tenderness.  Lymphadenopathy:    She has no cervical adenopathy.  Neurological: She is alert and oriented to person, place, and time. Coordination normal.  Skin: Skin is warm and dry. No rash noted. No erythema.  Psychiatric: She has a normal mood and affect. Her behavior is normal. Judgment and thought content normal.         Assessment and Plan

## 2010-12-16 NOTE — Patient Instructions (Signed)
You are doing well. No medication changes were made. Please call us if you have new issues that need to be addressed before your next appt.  We will call you for a follow up Appt. 6 months  

## 2010-12-16 NOTE — Assessment & Plan Note (Addendum)
Recent bypass surgery for ostial LAD and left circumflex disease. Currently with no significant chest pain concerning for angina. Overall she feels well though is concerned about returning to work as she has a very labor intensive job. I've asked her to talk with the surgical service in followup in 2 days to determine what her limitations should be over the next several months. She does have some mild dizziness. This could be secondary to hypotension. She has completed her Lasix and on clinical exam today, has no signs of heart failure. She could have mild hypovolemia but I have asked her not to drink excessively to avoid fluid retention and needing to restart Lasix. I have asked her to contact me if she continues to have dizziness as we may need to hold one of her medications.

## 2010-12-16 NOTE — Assessment & Plan Note (Signed)
We have stressed the importance of smoking cessation given her severe disease at an early age.

## 2010-12-16 NOTE — Assessment & Plan Note (Signed)
We discussed her peripheral vascular disease with her. She does have significant SFA disease amenable to intervention based on ultrasound. We will wait to hear from her when she would like to have this intervened upon. She does report some atypical type symptoms that seem to come on at nighttime while resting.

## 2010-12-17 ENCOUNTER — Other Ambulatory Visit: Payer: Self-pay | Admitting: Cardiothoracic Surgery

## 2010-12-17 DIAGNOSIS — I2510 Atherosclerotic heart disease of native coronary artery without angina pectoris: Secondary | ICD-10-CM

## 2010-12-18 ENCOUNTER — Ambulatory Visit
Admission: RE | Admit: 2010-12-18 | Discharge: 2010-12-18 | Disposition: A | Discharge status: Home / Self Care | Payer: No Typology Code available for payment source | Source: Ambulatory Visit | Attending: Cardiothoracic Surgery | Admitting: Cardiothoracic Surgery

## 2010-12-18 ENCOUNTER — Ambulatory Visit (INDEPENDENT_AMBULATORY_CARE_PROVIDER_SITE_OTHER): Payer: Self-pay | Admitting: Cardiothoracic Surgery

## 2010-12-18 DIAGNOSIS — I2510 Atherosclerotic heart disease of native coronary artery without angina pectoris: Secondary | ICD-10-CM

## 2010-12-19 NOTE — Assessment & Plan Note (Signed)
OFFICE VISIT  LORIE, CLECKLEY DOB:  03-Jun-1961                                        Dec 18, 2010 CHART #:  [id number]  CURRENT PROBLEMS: 1. Status post coronary artery bypass graft x4 November 09, 2010. 2. Hypertension. 3. Right femoral artery stenosis by preoperative noninvasive studies     approximately 80% with claudication and rest pain.  PRESENT ILLNESS:  The patient is a 50 year old [place] American female, reformed smoker returns for further office followup after undergoing multivessel bypass grafting month ago for three-vessel disease with unstable angina.  She is recovering well.  However, she is still not able to walk too well due to claudication of the right leg. Preoperative noninvasive Doppler study showed right femoral artery stenosis of approximately 80%.  She is also having rest pain and drapes her right leg over the side of the bed at night for pain.  Her angina has resolved and she has no symptoms of CHF and the surgical incisions from her CABG were healed.  HOME MEDICATIONS: 1. Advair Diskus. 2. Lopressor 25 mg b.i.d. 3. Spiriva 18 mcg daily. 4. Aspirin 325 mg daily. 5. Lisinopril 5 mg daily. 6. Simvastatin 40 mg daily. 7. P.r.n. Tylenol.  On exam; her blood pressure is 100/70, pulse 65, respirations 18, saturation 99% on room air.  She is alert and pleasant.  Breath sounds are clear and equal.  Cardiac rhythm is regular.  There is no murmur. The sternal incision is well healed.  Vein was harvested from her left leg and the incision is well healed.  A PA and lateral chest xray demonstrates stable sub-segmental atelectasis of the right middle lobe.  IMPRESSION AND PLAN:  The patient was commended for stopping smoking.  I do not feel she is ready to start cardiac rehab due to her significant claudication and she will be referred to Dr. Tonny Bollman for evaluation of possible percutaneous intervention of her right  SFA stenosis.  Otherwise, she will continue her current meds and will return here as needed.  I told her she could continue driving, light activities and to walk as much as possible until her claudication is significant.  Kerin Perna, M.D. Electronically Signed  PV/MEDQ  D:  12/18/2010  T:  12/19/2010  Job:  [id number]  cc:   Antonieta Iba, MD Veverly Fells. Excell Seltzer, MD

## 2010-12-25 ENCOUNTER — Telehealth: Payer: Self-pay | Admitting: Cardiovascular Disease

## 2010-12-25 NOTE — Telephone Encounter (Signed)
I spoke with Dr Excell Seltzer and he said that the pt needs to be scheduled for an appointment to discuss PV procedure.  I spoke with the pt's sister and scheduled the pt to see Dr Excell Seltzer on 01/13/11 at 11:00.

## 2010-12-25 NOTE — Telephone Encounter (Signed)
Pt sister wants to know if pt is schedule to get a stent on her right leg.

## 2011-01-01 ENCOUNTER — Other Ambulatory Visit: Payer: Self-pay | Admitting: Emergency Medicine

## 2011-01-01 MED ORDER — METOPROLOL TARTRATE 25 MG PO TABS: ORAL_TABLET | ORAL | Status: DC

## 2011-01-01 NOTE — Telephone Encounter (Signed)
Attempted to call patient, VM not set up and will call back later, unsure on dosage and pharmacy.

## 2011-01-01 NOTE — Telephone Encounter (Signed)
Spoke with pt and received correct fax number and dosage. rx called int pharmacy

## 2011-01-13 ENCOUNTER — Encounter: Payer: Self-pay | Admitting: Cardiovascular Disease

## 2011-01-13 ENCOUNTER — Ambulatory Visit (INDEPENDENT_AMBULATORY_CARE_PROVIDER_SITE_OTHER): Payer: Self-pay | Admitting: Cardiovascular Disease

## 2011-01-13 VITALS — BP 119/78 | HR 76 | Ht 66.0 in | Wt 181.0 lb

## 2011-01-13 DIAGNOSIS — I70219 Atherosclerosis of native arteries of extremities with intermittent claudication, unspecified extremity: Secondary | ICD-10-CM

## 2011-01-13 DIAGNOSIS — I739 Peripheral vascular disease, unspecified: Secondary | ICD-10-CM

## 2011-01-13 LAB — CBC WITH DIFFERENTIAL/PLATELET
Basophils Relative: 1.7 % (ref 0.0–3.0)
Eosinophils Relative: 4.4 % (ref 0.0–5.0)
Lymphocytes Relative: 42.6 % (ref 12.0–46.0)
Monocytes Relative: 6.1 % (ref 3.0–12.0)
Neutrophils Relative %: 45.2 % (ref 43.0–77.0)
RBC: 3.81 Mil/uL — ABNORMAL LOW (ref 3.87–5.11)
WBC: 7.1 10*3/uL (ref 4.5–10.5)

## 2011-01-13 LAB — BASIC METABOLIC PANEL
Calcium: 9.8 mg/dL (ref 8.4–10.5)
Chloride: 103 mEq/L (ref 96–112)
Creatinine, Ser: 0.7 mg/dL (ref 0.4–1.2)

## 2011-01-13 LAB — PROTIME-INR: INR: 1.1 ratio — ABNORMAL HIGH (ref 0.8–1.0)

## 2011-01-13 MED ORDER — CLOPIDOGREL BISULFATE 75 MG PO TABS: 75.0000 mg | ORAL_TABLET | Freq: Every day | ORAL | Status: DC

## 2011-01-13 NOTE — Patient Instructions (Addendum)
Your physician has requested that you have a peripheral vascular angiogram. This exam is performed at the hospital. During this exam IV contrast is used to look at arterial blood flow. Please review the information sheet given for details.  START Plavix per instructions provided on information sheet.

## 2011-01-14 ENCOUNTER — Telehealth: Payer: Self-pay | Admitting: *Deleted

## 2011-01-14 NOTE — Telephone Encounter (Signed)
I spoke with patient's sister and she is aware plavix is here from [facility].  They will pick it up tomorrow. Mylo Red RN

## 2011-01-15 ENCOUNTER — Ambulatory Visit (HOSPITAL_COMMUNITY)
Admission: RE | Admit: 2011-01-15 | Discharge: 2011-01-15 | Disposition: A | Discharge status: Home / Self Care | Payer: Self-pay | Source: Ambulatory Visit | Attending: Cardiovascular Disease | Admitting: Cardiovascular Disease

## 2011-01-15 ENCOUNTER — Encounter: Payer: Self-pay | Admitting: Cardiovascular Disease

## 2011-01-15 DIAGNOSIS — I70219 Atherosclerosis of native arteries of extremities with intermittent claudication, unspecified extremity: Secondary | ICD-10-CM | POA: Insufficient documentation

## 2011-01-15 DIAGNOSIS — I2510 Atherosclerotic heart disease of native coronary artery without angina pectoris: Secondary | ICD-10-CM | POA: Insufficient documentation

## 2011-01-15 DIAGNOSIS — Z01810 Encounter for preprocedural cardiovascular examination: Secondary | ICD-10-CM | POA: Insufficient documentation

## 2011-01-15 LAB — POCT ACTIVATED CLOTTING TIME: Activated Clotting Time: 424 seconds

## 2011-01-15 NOTE — Progress Notes (Signed)
HPI:  This is a 50 year old woman presenting for followup of peripheral arterial disease. The patient has lifestyle limiting right leg claudication. She underwent an arterial duplex scan demonstrated severe right SFA stenosis and had an ABI of 0.7 on the right. The left ABI is normal and she has no symptoms in that leg. Angiography confirmed severe stenosis in the midportion of the right superficial femoral artery without significant obstructive disease elsewhere. The patient was also noted to have severe coronary artery disease and she underwent coronary bypass surgery earlier this year. She has now recovered from surgery and seeks treatment for her lower extremity arterial disease that she can go back to work. Her main limitation now is related to her leg. She describes pain in her calf with short distance walking of less than 50 feet. The pain resolves with rest. She denies ulceration in the foot. She has no rest pain. The pain is described as an ache and "tightness."  Outpatient Encounter Prescriptions as of 01/13/2011  Medication Sig Dispense Refill  . aspirin 325 MG tablet Take 325 mg by mouth daily.        . calcium-vitamin D (OSCAL WITH D) 500-200 MG-UNIT per tablet Take 1 tablet by mouth daily.        [name] lisinopril (PRINIVIL,ZESTRIL) 5 MG tablet Take 1 tablet (5 mg total) by mouth daily.  30 tablet  6  . metoprolol tartrate (LOPRESSOR) 25 MG tablet one and a half tablets two times a day.  90 tablet  6  . Multiple Vitamin (MULTIVITAMIN) tablet Take 1 tablet by mouth daily.        . simvastatin (ZOCOR) 40 MG tablet Take 1 tablet (40 mg total) by mouth at bedtime.  30 tablet  6  . traMADol (ULTRAM) 50 MG tablet Take 50 mg by mouth every 6 (six) hours as needed.        . clopidogrel (PLAVIX) 75 MG tablet Take 1 tablet (75 mg total) by mouth daily.  30 tablet  2  . nitroGLYCERIN (NITROSTAT) 0.4 MG SL tablet Place 0.4 mg under the tongue every 5 (five) minutes as needed.        [name] DISCONTD: cilostazol  (PLETAL) 100 MG tablet Take 100 mg by mouth 2 (two) times daily.          Allergies  Allergen Reactions  . Latex   . Sulfonamide Derivatives     Past Medical History  Diagnosis Date  . Coronary artery disease     PCI of left circumflex 2003, PCI of the LAD 2012 with a bare-metal stent (2.5 x 8 mm)  . Hyperlipidemia   . Hypertension     ROS: Negative except as per HPI  BP 119/78  Pulse 76  Ht 5\' 6"  (1.676 m)  Wt 181 lb (82.101 kg)  BMI 29.21 kg/m2  PHYSICAL EXAM: Pt is alert and oriented, NAD HEENT: normal Neck: JVP - normal, carotids 2+= without bruits Lungs: CTA bilaterally CV: RRR without murmur or gallop Abd: soft, NT, Positive BS, no hepatomegaly Ext: no C/C/E, femoral pulses 2+ and equal bilaterally, pedal pulses are 2+ on the left and 1+ on the right Skin: warm/dry no rash  ASSESSMENT AND PLAN:

## 2011-01-15 NOTE — Assessment & Plan Note (Signed)
The patient has lifestyle limiting claudication which has not improved with the use of Pletal. She has known severe right SFA stenosis. We discussed risks, indications, and alternatives to lower extremity angiography with planned atherectomy and possible PTA and stenting of the right superficial femoral artery. The patient agrees to proceed with this. She understands our main objective is symptom relief and she will need to continue with lifelong aggressive medical therapy and continued smoking cessation.

## 2011-01-20 ENCOUNTER — Ambulatory Visit: Payer: Self-pay | Admitting: Cardiothoracic Surgery

## 2011-02-01 ENCOUNTER — Encounter: Payer: Self-pay | Admitting: Physician Assistant

## 2011-02-03 ENCOUNTER — Other Ambulatory Visit: Payer: Self-pay | Admitting: *Deleted

## 2011-02-03 ENCOUNTER — Encounter: Payer: Self-pay | Admitting: Physician Assistant

## 2011-02-03 ENCOUNTER — Other Ambulatory Visit: Payer: Self-pay | Admitting: Cardiovascular Disease

## 2011-02-03 ENCOUNTER — Encounter (INDEPENDENT_AMBULATORY_CARE_PROVIDER_SITE_OTHER): Payer: Self-pay | Admitting: *Deleted

## 2011-02-03 ENCOUNTER — Ambulatory Visit (INDEPENDENT_AMBULATORY_CARE_PROVIDER_SITE_OTHER): Payer: Self-pay | Admitting: Physician Assistant

## 2011-02-03 ENCOUNTER — Encounter: Payer: Self-pay | Admitting: *Deleted

## 2011-02-03 VITALS — BP 134/78 | HR 62 | Ht 66.0 in | Wt 181.8 lb

## 2011-02-03 DIAGNOSIS — I739 Peripheral vascular disease, unspecified: Secondary | ICD-10-CM

## 2011-02-03 DIAGNOSIS — I2510 Atherosclerotic heart disease of native coronary artery without angina pectoris: Secondary | ICD-10-CM

## 2011-02-03 NOTE — Progress Notes (Signed)
History of Present Illness: Primary Cardiologist: Dr. Tonny Bollman  [name] is a 50 y.o. female with a h/o CAD and PAD with lifestyle limiting right leg claudication. She underwent an arterial duplex scan that demonstrated severe right SFA stenosis and had an ABI of 0.7 on the right. The left ABI is normal and she has no symptoms in that leg. Angiography confirmed severe stenosis in the midportion of the right superficial femoral artery without significant obstructive disease elsewhere. The patient was also noted to have severe coronary artery disease and she underwent coronary bypass surgery earlier this year (L-LAD, S-Dx, S-OM, S-RCA).  She recovered from surgery and and saw Dr. Excell Seltzer several weeks ago to seek treatment for her lower extremity arterial disease so that she can go back to work. Her main limitation was related to her leg. She described pain in her calf with short distance walking of less than 50 feet. The pain resolved with rest. She denied ulceration in the foot. She had no rest pain. The pain was described as an ache and "tightness."  She underwent atherectomy and PTA of her right SFA on 6/20 with Dr. Excell Seltzer and returns for follow up.  Doing well.  Denies any further claudication.  She has a h/o knee and hip pain which is unrelated.  She denies chest pain, dyspnea, syncope.  She is eager to go back to work.  She had her ABI's this morning and I discussed with the tech who notes they are now normal.   Past Medical History  Diagnosis Date  . Coronary artery disease     PCI of left circumflex 2003, PCI of the LAD 2012 with a bare-metal stent (2.5 x 8 mm);   s/p CABG 4/12:  L-LAD, S-Dx, S-OM, S-RCA (Dr. Donata Clay)  . Hyperlipidemia   . Hypertension   . PAD (peripheral artery disease)     s/p Right SFA atherectomy and PTA 01/15/11    Current Outpatient Prescriptions  Medication Sig Dispense Refill  . aspirin 325 MG tablet Take 325 mg by mouth daily.        .  calcium-vitamin D (OSCAL WITH D) 500-200 MG-UNIT per tablet Take 1 tablet by mouth daily.        . clopidogrel (PLAVIX) 75 MG tablet Take 1 tablet (75 mg total) by mouth daily.  30 tablet  2  . lisinopril (PRINIVIL,ZESTRIL) 5 MG tablet Take 1 tablet (5 mg total) by mouth daily.  30 tablet  6  . metoprolol tartrate (LOPRESSOR) 25 MG tablet one and a half tablets two times a day.  90 tablet  6  . Multiple Vitamin (MULTIVITAMIN) tablet Take 1 tablet by mouth daily.        . nitroGLYCERIN (NITROSTAT) 0.4 MG SL tablet Place 0.4 mg under the tongue every 5 (five) minutes as needed.        . simvastatin (ZOCOR) 40 MG tablet Take 1 tablet (40 mg total) by mouth at bedtime.  30 tablet  6  . traMADol (ULTRAM) 50 MG tablet Take 50 mg by mouth every 6 (six) hours as needed.          Allergies: Allergies  Allergen Reactions  . Latex   . Shellfish Allergy     Hard shellfish   . Sulfonamide Derivatives     Vital Signs: BP 134/78  Pulse 62  Ht 5\' 6"  (1.676 m)  Wt 181 lb 12 oz (82.441 kg)  BMI 29.34 kg/m2  PHYSICAL EXAM: Well nourished, well  developed, in no acute distress HEENT: normal Neck: no JVD Cardiac:  normal S1, S2; RRR; no murmur Lungs:  clear to auscultation bilaterally, no wheezing, rhonchi or rales Abd: soft, nontender, no hepatomegaly Ext: no edema;  Left FA site without hematoma or bruit, no lesions on feet or legs Vascular:  DP/PT 2+ bilaterally, normal cap refill Skin: warm and dry Neuro:  CNs 2-12 intact, no focal abnormalities noted  EKG:  Sinus rhythm, heart rate 62, normal axis, T wave inversions in leads 2, 3, aVF and V3-V6, No significant change compared to prior tracings  ASSESSMENT AND PLAN:

## 2011-02-03 NOTE — Assessment & Plan Note (Signed)
Doing well post PTA.  No further claudication and ABIs normal today.  Continue ASA and Plavix.  She plans to have her follow up ABIs in [place], where she sees Dr. [name] for general cardiology follow up.  I have given her a note to return to work today, but limit lifting to no more than 20 pounds an to avoid extreme heat.

## 2011-02-03 NOTE — Patient Instructions (Signed)
Your physician recommends that you schedule a follow-up appointment in: November with Dr [name] as scheduled.

## 2011-02-06 ENCOUNTER — Encounter: Payer: Self-pay | Admitting: Cardiovascular Disease

## 2011-02-11 ENCOUNTER — Telehealth: Payer: Self-pay | Admitting: *Deleted

## 2011-02-12 ENCOUNTER — Telehealth: Payer: Self-pay | Admitting: *Deleted

## 2011-02-12 NOTE — Telephone Encounter (Signed)
Attempted to contact pt, LMOM TCB.  

## 2011-02-12 NOTE — Telephone Encounter (Signed)
Spoke to pt's sister, unable to reach pt at this time, pt to call back at later time today. Had msg yesterday stating pt has had incr swelling in legs and pain due to swelling. Pt unable to get in touch with me here, so she went to her pcp Dr. Thana Ates, and pt has restarted Lasix as of yesterday evening. Will discuss symptoms today when pt returns call.

## 2011-02-12 NOTE — Telephone Encounter (Signed)
S/w sister @ home # listed, she states we can call pt @ [phone]. I tried this but the mail box is not set up yet. I then called the other mobile # and I lm for ptcb to advise pt to finish plavix and then start 325 mg asa. Danielle Rankin

## 2011-02-13 NOTE — Telephone Encounter (Signed)
Called pt again today to follow up with how she was doing, did not reach her. LMOM TCB. If pt started lasix, need to see how it is working. If not better, need to discuss with Dr. [name] or have her come in for appt. If she has lost significant amount of swelling in short period, she may need labs to make sure not too dry. Please follow up with pt. Unable to reach today. Thanks.

## 2011-02-13 NOTE — Procedures (Signed)
  NAMESHAKORA, NORDQUIST NO.:  [id number]  MEDICAL RECORD NO.:  [id number]  LOCATION:  CATH                         FACILITY:  [facility]  PHYSICIAN:  Veverly Fells. Excell Seltzer, MD  DATE OF BIRTH:  1961/03/23  DATE OF PROCEDURE:  01/15/2011 DATE OF DISCHARGE:                   PERIPHERAL VASCULAR INVASIVE PROCEDURE   PROCEDURES: 1. Right external iliac angiography with runoff to the foot. 2. Right SFA atherectomy and PTA.  PROCEDURAL INDICATIONS:  [name] is a 50 year old woman with coronary and peripheral arterial disease.  She has undergone recent coronary bypass surgery.  She is now almost limited by right calf claudication at low level exertion.  Her ABI is 0.7 on the right and she has been diagnosed with a severe mid SFA stenosis.  She presents today for angiography and intervention.  Risks and indications of procedure were reviewed in detail with the patient.  Informed consent was obtained.  The patient had been adequately preloaded with aspirin and Plavix.  The left groin was prepped, draped, and anesthetized with 1% lidocaine.  Arterial access was difficult, but I was eventually able to access the left common femoral artery, and a 7-French Terumo destination sheath was advanced. Using a crossover catheter, the contralateral iliac artery was accessed and a Versacore wire was advanced into the right superficial femoral artery.  The 7-French sheath was advanced over the 5-French catheter into the external iliac artery.  The Angiomax was started for anticoagulation.  Initial angiography was performed and it showed stability of severe lesion in the right mid SFA.  The vessel was still patent with good flow.  A 300-cm Cougar guide wire was advanced into the popliteal artery beyond the area of stenosis and [place]awk directional atherectomy was performed.  A total of 4 or 5 cuts were made through the lesion.  Angiography demonstrated a good result with about 30%  residual stenosis.  A 5 x 30 mm balloon was then advanced and dilated to 2 atmospheres over the lesion.  Final angiography demonstrated an excellent result with 0% residual stenosis.  There was a little bit of slow flow in the tibial vessels and the nitroglycerin was administered through the sheath.  Final angiography demonstrated excellent flow into the tibials with 3-vessel runoff.  The patient tolerated the procedure well and there were no immediate complications.  The sheath was pulled back into the ipsilateral external iliac artery and angiography showed that the sheath was occlusive.  Nitroglycerin was again administered through the sheath and the common femoral artery regained flow.  The sheath will be left in place and manual pressure will be used for hemostasis once the bivalirudin has been off for 2 hours.     Veverly Fells. Excell Seltzer, MD     MDC/MEDQ  D:  01/15/2011  T:  01/15/2011  Job:  [id number]  Electronically Signed by Tonny Bollman MD on 02/13/2011 12:27:33 AM

## 2011-02-14 NOTE — Telephone Encounter (Signed)
LMOM, again no answer.

## 2011-02-27 NOTE — Telephone Encounter (Signed)
Opened in error

## 2011-03-07 ENCOUNTER — Emergency Department: Payer: Self-pay | Admitting: Unknown Physician Specialty

## 2011-03-19 ENCOUNTER — Ambulatory Visit: Payer: Self-pay | Admitting: Cardiothoracic Surgery

## 2011-04-15 ENCOUNTER — Emergency Department: Payer: Self-pay | Admitting: *Deleted

## 2011-04-17 ENCOUNTER — Emergency Department: Payer: Self-pay | Admitting: Internal Medicine

## 2011-04-29 ENCOUNTER — Telehealth: Payer: Self-pay | Admitting: Cardiovascular Disease

## 2011-04-29 ENCOUNTER — Encounter: Payer: Self-pay | Admitting: *Deleted

## 2011-04-29 NOTE — Telephone Encounter (Signed)
Spoke to pt, she said the first episode was in the shower, "heart beating really fast and hard to catch my breath," got out of shower, resolved. About 10 min later, fast beats again. Does not have monitor at home to measure HR. She is taking metoprolol 25mg  BID, has not missed any doses. This is the first episode of this happening. She states she was due for f/u anyway so wants to schedule f/u now. I advised pt to call prior to her next f/u if any more episodes occur, we may have her take an extra metop? May be able to capture on 12 lead EKG, otherwise she will f/u with Dr. [name]. Pt ok with this.

## 2011-04-29 NOTE — Telephone Encounter (Signed)
Pt was in the shower this am when she experienced rapid heart rate several times today. Pt was at home and work when this happened.  [phone] or  [phone].

## 2011-04-30 NOTE — Telephone Encounter (Signed)
Extra metop ok. Would try to get EKG If frequent, could try holter

## 2011-05-01 NOTE — Telephone Encounter (Signed)
Spoke to pt, she is home today, she has felt same symptoms and is going to take an extra metoprolol, if does not help she will call back and we can do an EKG.

## 2011-05-01 NOTE — Telephone Encounter (Signed)
Attempted to contact pt, LMOM TCB.  

## 2011-05-02 ENCOUNTER — Ambulatory Visit (INDEPENDENT_AMBULATORY_CARE_PROVIDER_SITE_OTHER): Payer: Self-pay | Admitting: *Deleted

## 2011-05-02 DIAGNOSIS — R0602 Shortness of breath: Secondary | ICD-10-CM

## 2011-05-02 DIAGNOSIS — I2510 Atherosclerotic heart disease of native coronary artery without angina pectoris: Secondary | ICD-10-CM

## 2011-05-02 DIAGNOSIS — R002 Palpitations: Secondary | ICD-10-CM

## 2011-05-02 NOTE — Progress Notes (Signed)
Pt in for EKG today for continued episodes of palpitations and SOB. These come and go, pt asymptomatic at visit. EKG showed NSR, T wave abnormality that is unchanged from previous EKG, HR 62. I advised pt to monitor HR at home, she will get machine, when she is having symptoms and advised she take an extra metoprolol for palpitations. Pt has f/u next week with Dr. [name]. If symptoms have continued, she will discuss plan at that time.

## 2011-05-05 ENCOUNTER — Ambulatory Visit: Payer: Self-pay | Admitting: Family Medicine

## 2011-05-07 ENCOUNTER — Ambulatory Visit (INDEPENDENT_AMBULATORY_CARE_PROVIDER_SITE_OTHER): Payer: Self-pay | Admitting: Cardiovascular Disease

## 2011-05-07 ENCOUNTER — Encounter: Payer: Self-pay | Admitting: Cardiovascular Disease

## 2011-05-07 DIAGNOSIS — I2510 Atherosclerotic heart disease of native coronary artery without angina pectoris: Secondary | ICD-10-CM

## 2011-05-07 DIAGNOSIS — M542 Cervicalgia: Secondary | ICD-10-CM

## 2011-05-07 DIAGNOSIS — I739 Peripheral vascular disease, unspecified: Secondary | ICD-10-CM

## 2011-05-07 DIAGNOSIS — E785 Hyperlipidemia, unspecified: Secondary | ICD-10-CM

## 2011-05-07 DIAGNOSIS — Z951 Presence of aortocoronary bypass graft: Secondary | ICD-10-CM

## 2011-05-07 MED ORDER — ASPIRIN 81 MG PO TABS: 81.0000 mg | ORAL_TABLET | Freq: Every day | ORAL | Status: DC

## 2011-05-07 NOTE — Progress Notes (Signed)
Patient ID: [name], female    DOB: [birth year], 50 y.o.   MRN: [id number]  HPI Comments: 50 yo woman  With A long smoking history who stopped earlier this year,  h/o CAD s/p  NSTEMI at [facility]  1/12, with BMS to pLAD (80% stenosis), residual severe stenosis of the RCA which is a nondominant small vessel, With continued chest pain, repeat cardiac catheterization showing distal left main stenosis involving a high grade ostial LAD stenosis and circumflex disease approximately 90%, 90% diagonal disease, 80% nondominant RCA disease with ejection fraction 45%, who underwent bypass surgery x4 with a LIMA to the LAD, vein graft to the diagonal, vein graft to the marginal and vein graft to the RCA,    severe right SFA stenosis and had an ABI of 0.7 on the right. The left ABI is normal and she has no symptoms in that leg. Angiography confirmed severe stenosis in the midportion of the right superficial femoral artery without significant obstructive disease elsewhere.   S/p atherectomy and PTA of her right SFA on 01/15/11  She presents today and reports that she has had significant pain radiating from her neck down her right arm with right hand weakness. She also has occasional tingling in her left hand in several fingers. She had a recent neck x-ray. She also has tightness in the muscles around the base of her shoulder and neck. 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 She reports that she has contacted a group to help her with disability.  She did report one episode of a fast heart rhythm while she was in the shower. She got out of the shower and rested and her symptoms resolved.  She currently takes Lopressor b.i.d.. No significant chest pain recently.  EKG shows normal sinus rhythm with rate 69 beats per minute, rare APC, T-wave abnormality in V3 to V6, 2, 3, aVF   Outpatient Encounter Prescriptions as of 05/07/2011  Medication Sig Dispense Refill  .  Calcium-Magnesium (CALCIUM MAGNESIUM 750) 300-300 MG TABS Take by mouth.        . calcium-vitamin D (OSCAL WITH D) 500-200 MG-UNIT per tablet Take 1 tablet by mouth daily.        . clopidogrel (PLAVIX) 75 MG tablet Take 1 tablet (75 mg total) by mouth daily.  30 tablet  2  . cyclobenzaprine (FLEXERIL) 10 MG tablet Take 10 mg by mouth 3 (three) times daily as needed.        [name] lisinopril (PRINIVIL,ZESTRIL) 5 MG tablet Take 1 tablet (5 mg total) by mouth daily.  30 tablet  6  . meloxicam (MOBIC) 7.5 MG tablet Take 7.5 mg by mouth 2 (two) times daily.        . metoprolol tartrate (LOPRESSOR) 25 MG tablet Take 25 mg by mouth 2 (two) times daily.       . Multiple Vitamin (MULTIVITAMIN) tablet Take 1 tablet by mouth daily.        . nitroGLYCERIN (NITROSTAT) 0.4 MG SL tablet Place 0.4 mg under the tongue every 5 (five) minutes as needed.        . predniSONE (DELTASONE) 20 MG tablet Take 20 mg by mouth 2 (two) times daily.        . simvastatin (ZOCOR) 40 MG tablet Take 1 tablet (40 mg total) by mouth at bedtime.  30 tablet  6  . traMADol (ULTRAM) 50 MG tablet Take 50 mg by mouth every 6 (  six) hours as needed.        [name]  aspirin 325 MG tablet Take 325 mg by mouth daily.           Review of Systems  Constitutional: Negative.   HENT: Negative.   Eyes: Negative.   Respiratory: Negative.   Cardiovascular: Positive for palpitations.  Gastrointestinal: Negative.   Musculoskeletal: Positive for myalgias, back pain, joint swelling and arthralgias.  Skin: Negative.   Neurological: Negative.   Hematological: Negative.   Psychiatric/Behavioral: Negative.   All other systems reviewed and are negative.    BP 133/89  Pulse 69  Ht 5\' 6"  (1.676 m)  Wt 137 lb (62.143 kg)  BMI 22.11 kg/m2   Physical Exam  Nursing note and vitals reviewed. Constitutional: She is oriented to person, place, and time. She appears well-developed and well-nourished.  HENT:  Head: Normocephalic.  Nose: Nose normal.    Mouth/Throat: Oropharynx is clear and moist.  Eyes: Conjunctivae are normal. Pupils are equal, round, and reactive to light.  Neck: Normal range of motion. Neck supple. No JVD present.  Cardiovascular: Normal rate, regular rhythm, S1 normal, S2 normal, normal heart sounds and intact distal pulses.  Exam reveals no gallop and no friction rub.   No murmur heard. Pulmonary/Chest: Effort normal and breath sounds normal. No respiratory distress. She has no wheezes. She has no rales. She exhibits no tenderness.  Abdominal: Soft. Bowel sounds are normal. She exhibits no distension. There is no tenderness.  Musculoskeletal: Normal range of motion. She exhibits no edema and no tenderness.  Lymphadenopathy:    She has no cervical adenopathy.  Neurological: She is alert and oriented to person, place, and time. Coordination normal.  Skin: Skin is warm and dry. No rash noted. No erythema.  Psychiatric: She has a normal mood and affect. Her behavior is normal. Judgment and thought content normal.         Assessment and Plan

## 2011-05-07 NOTE — Patient Instructions (Addendum)
You are doing well. Please decrease the aspirin to 81 mg daily with plavix daily Please call us if you have new issues that need to be addressed before your next appt.  We will call you for a follow up Appt. In 6 months

## 2011-05-07 NOTE — Assessment & Plan Note (Signed)
She has been evaluated by Dr. Charlette Caffey. She is currently on prednisone, Flexeril, meloxicam. Reports continued symptoms of pain down her right arm into her right hand. Also some tingling in her left hand. Uncertain if she could have some component of carpal tunnel.

## 2011-05-07 NOTE — Assessment & Plan Note (Signed)
She has not been taking her cholesterol medication on a regular basis. We have encouraged her to take this daily.

## 2011-05-07 NOTE — Assessment & Plan Note (Signed)
No symptoms of claudication. Continue Aspirin and Plavix with cholesterol management.

## 2011-05-07 NOTE — Assessment & Plan Note (Signed)
Currently with no symptoms of angina. No further workup at this time. Continue current medication regimen. 

## 2011-05-24 ENCOUNTER — Ambulatory Visit: Payer: Self-pay | Admitting: Family Medicine

## 2011-06-04 ENCOUNTER — Telehealth: Payer: Self-pay | Admitting: *Deleted

## 2011-06-04 NOTE — Telephone Encounter (Signed)
Pt left msg on my vm stating she has had incr SOB, and wants a call back today. I returned call at both numbers given. LMOM TCB.

## 2011-06-05 NOTE — Telephone Encounter (Signed)
Called and spoke to pt's sister, she said pt ended up going to pcp, Dr. Thana Ates. She was dx with bronchitis, and is being treated with inhaler (unknown) and ABX. Pt also had recent MRI, she wanted [place] to know that showed degenerative bone dz, regarding other symptoms she has had in past. She will notify pt to continue meds, and f/u with pcp as this will need to be treated and followed by them. She will call with any other cardiac issues.

## 2011-06-20 ENCOUNTER — Telehealth: Payer: Self-pay

## 2011-06-20 DIAGNOSIS — I70219 Atherosclerosis of native arteries of extremities with intermittent claudication, unspecified extremity: Secondary | ICD-10-CM

## 2011-06-20 MED ORDER — CLOPIDOGREL BISULFATE 75 MG PO TABS: 75.0000 mg | ORAL_TABLET | Freq: Every day | ORAL | Status: DC

## 2011-06-20 NOTE — Telephone Encounter (Signed)
Ok to call in generic plavix No early appt needed. Long smoking hx. Medication noncompliance issues

## 2011-06-20 NOTE — Telephone Encounter (Signed)
The patient would like to start on the generic plavix to be sent to [facility] on [address].  She has been out of plavix for one month because of her body going through a lot with acute bronchitis and back pain with arthritis. She went to pain management for an evaluation. Has been dealing with a cough for 3 months, with having difficulty breathing, she has taken one round of antibiotic with an inhaler (ventolin). She is concerned because having some chest pain that comes & goes.  She called Dr. Clance Boll office today to see if they can see her.  Dr. Ronita Hipps with pain management said, "she is not a candidate for surgery because of her heart condition." He would only have her do therapy and vicodin. Her call back is [phone]. She is scheduled for a follow up on Dec. 7, 2012 with Dr. [name]. Please advise if you think should be seen sooner than Dec. 7, 2012.

## 2011-06-26 ENCOUNTER — Ambulatory Visit: Payer: Self-pay | Admitting: Anesthesiology

## 2011-07-01 ENCOUNTER — Encounter: Payer: Self-pay | Admitting: Cardiovascular Disease

## 2011-07-04 ENCOUNTER — Ambulatory Visit (INDEPENDENT_AMBULATORY_CARE_PROVIDER_SITE_OTHER): Payer: Self-pay | Admitting: Cardiovascular Disease

## 2011-07-04 ENCOUNTER — Encounter: Payer: Self-pay | Admitting: Cardiovascular Disease

## 2011-07-04 DIAGNOSIS — Z951 Presence of aortocoronary bypass graft: Secondary | ICD-10-CM

## 2011-07-04 DIAGNOSIS — I739 Peripheral vascular disease, unspecified: Secondary | ICD-10-CM

## 2011-07-04 DIAGNOSIS — Z87891 Personal history of nicotine dependence: Secondary | ICD-10-CM

## 2011-07-04 DIAGNOSIS — E785 Hyperlipidemia, unspecified: Secondary | ICD-10-CM

## 2011-07-04 DIAGNOSIS — M542 Cervicalgia: Secondary | ICD-10-CM

## 2011-07-04 DIAGNOSIS — I2510 Atherosclerotic heart disease of native coronary artery without angina pectoris: Secondary | ICD-10-CM

## 2011-07-04 DIAGNOSIS — R079 Chest pain, unspecified: Secondary | ICD-10-CM

## 2011-07-04 NOTE — Assessment & Plan Note (Signed)
Much of her coronary and peripheral vascular disease secondary to smoking. We have encouraged continued smoking cessation.

## 2011-07-04 NOTE — Patient Instructions (Addendum)
You are doing well. No medication changes were made.  Please call us if you have new issues that need to be addressed before your next appt.  The office will contact you for a follow up Appt. In 6 months Your physician recommends that you return for a FASTING lipid profile: 3 months

## 2011-07-04 NOTE — Progress Notes (Signed)
Patient ID: [name], female    DOB: May 29, 1961, 50 y.o.   MRN: [id number]  HPI Comments: 50 yo woman  With A long smoking history,  h/o CAD s/p  NSTEMI at [facility]  1/12, with BMS to pLAD (80% stenosis), residual severe stenosis of the RCA which is a nondominant small vessel, With continued chest pain, repeat cardiac catheterization showing distal left main stenosis involving a high grade ostial LAD stenosis and circumflex disease approximately 90%, 90% diagonal disease, 80% nondominant RCA disease with ejection fraction 45%, who underwent bypass surgery x4 with a LIMA to the LAD, vein graft to the diagonal, vein graft to the marginal and vein graft to the RCA,  Also with peripheral vascular disease, S/p atherectomy and PTA of her right SFA on 01/15/11,  who presents for routine followup.   She presents today and reports that she Continues to have significant pain radiating from her neck down her right arm with right hand weakness. She reports having degenerative disc disease around C5 and C6. She was told not to have surgery from a pain clinic secondary to limited benefit from neck surgery. Details are uncertain and unavailable.   She is recovering from bronchitis and did have a course of antibiotics with inhalers. She continues to have a residual postinflammatory cough.  Rare episodes of tightness in her left arm. No significant chest pain recently. She finds that she drops items on her right side secondary to right arm nerve issues.  She has not been back at work secondary to her neck and arm pain on the right.  EKG shows normal sinus rhythm with rate 75 beats per minute, rare PVC, T-wave abnormality in V3 to V6, 2, 3, aVF ( unchanged from previous EKG)  Outpatient Encounter Prescriptions as of 07/04/2011  Medication Sig Dispense Refill  . aspirin 81 MG tablet Take 1 tablet (81 mg total) by mouth daily.  30 tablet  0  . Calcium-Magnesium (CALCIUM MAGNESIUM 750) 300-300 MG TABS Take by mouth.         . calcium-vitamin D (OSCAL WITH D) 500-200 MG-UNIT per tablet Take 1 tablet by mouth daily.        . clopidogrel (PLAVIX) 75 MG tablet Take 1 tablet (75 mg total) by mouth daily.  30 tablet  2  . HYDROcodone-acetaminophen (NORCO) 5-325 MG per tablet Take 1 tablet by mouth every 6 (six) hours as needed.        [name] lisinopril (PRINIVIL,ZESTRIL) 5 MG tablet Take 1 tablet (5 mg total) by mouth daily.  30 tablet  6  . metoprolol tartrate (LOPRESSOR) 25 MG tablet Take 25 mg by mouth 2 (two) times daily.       . Multiple Vitamin (MULTIVITAMIN) tablet Take 1 tablet by mouth daily.        . nitroGLYCERIN (NITROSTAT) 0.4 MG SL tablet Place 0.4 mg under the tongue every 5 (five) minutes as needed.        . simvastatin (ZOCOR) 40 MG tablet Take 1 tablet (40 mg total) by mouth at bedtime.  30 tablet  6     Review of Systems  Constitutional: Negative.   HENT: Negative.   Eyes: Negative.   Respiratory: Negative.   Gastrointestinal: Negative.   Musculoskeletal: Positive for myalgias, back pain, joint swelling and arthralgias.  Skin: Negative.   Neurological: Negative.   Hematological: Negative.   Psychiatric/Behavioral: Negative.   All other systems reviewed and are negative.    BP 100/70  Pulse 75  Ht 5\' 6"  (1.676 m)  Wt 177 lb (80.287 kg)  BMI 28.57 kg/m2   Physical Exam  Nursing note and vitals reviewed. Constitutional: She is oriented to person, place, and time. She appears well-developed and well-nourished.  HENT:  Head: Normocephalic.  Nose: Nose normal.  Mouth/Throat: Oropharynx is clear and moist.  Eyes: Conjunctivae are normal. Pupils are equal, round, and reactive to light.  Neck: Normal range of motion. Neck supple. No JVD present.  Cardiovascular: Normal rate, regular rhythm, S1 normal, S2 normal, normal heart sounds and intact distal pulses.  Exam reveals no gallop and no friction rub.   No murmur heard. Pulmonary/Chest: Effort normal and breath sounds normal. No  respiratory distress. She has no wheezes. She has no rales. She exhibits no tenderness.  Abdominal: Soft. Bowel sounds are normal. She exhibits no distension. There is no tenderness.  Musculoskeletal: Normal range of motion. She exhibits no edema and no tenderness.  Lymphadenopathy:    She has no cervical adenopathy.  Neurological: She is alert and oriented to person, place, and time. Coordination normal.  Skin: Skin is warm and dry. No rash noted. No erythema.  Psychiatric: She has a normal mood and affect. Her behavior is normal. Judgment and thought content normal.         Assessment and Plan

## 2011-07-04 NOTE — Assessment & Plan Note (Signed)
Rare episodes of left arm discomfort. Etiology uncertain though could be secondary to small vessel disease. Rare episodes of chest tightness. We have suggested she take nitroglycerin p.r.n. For any chest tightness or left arm discomfort concerning for angina. Unable to start long-acting nitroglycerin secondary to low blood pressure.

## 2011-07-04 NOTE — Assessment & Plan Note (Signed)
We have encouraged she take her cholesterol medication a goal LDL less than 70.

## 2011-07-04 NOTE — Assessment & Plan Note (Signed)
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

## 2011-07-04 NOTE — Assessment & Plan Note (Signed)
Stent placed to her right SFA. She will need repeat Doppler next year and continued aggressive medical management.

## 2011-07-08 ENCOUNTER — Other Ambulatory Visit: Payer: Self-pay | Admitting: *Deleted

## 2011-07-11 ENCOUNTER — Ambulatory Visit (INDEPENDENT_AMBULATORY_CARE_PROVIDER_SITE_OTHER): Payer: Self-pay | Admitting: *Deleted

## 2011-07-11 DIAGNOSIS — R079 Chest pain, unspecified: Secondary | ICD-10-CM

## 2011-07-11 NOTE — Progress Notes (Signed)
Addended by: Festus Aloe on: 07/11/2011 10:28 AM   Modules accepted: Orders

## 2011-07-12 LAB — LIPID PANEL
Chol/HDL Ratio: 4.1 ratio units (ref 0.0–4.4)
HDL: 58 mg/dL (ref >39)
LDL Calculated: 149 mg/dL — ABNORMAL HIGH (ref 0–99)
VLDL Cholesterol Cal: 28 mg/dL (ref 5–40)

## 2011-07-12 LAB — HEPATIC FUNCTION PANEL
Albumin: 4.3 g/dL (ref 3.5–5.5)
Total Bilirubin: 0.2 mg/dL (ref 0.0–1.2)
Total Protein: 7.2 g/dL (ref 6.0–8.5)

## 2011-08-14 ENCOUNTER — Telehealth: Payer: Self-pay

## 2011-08-14 NOTE — Telephone Encounter (Signed)
The patient needs to have a tooth pulled is on plavix and needs an okay of how many days can come off plavix. Please advise.

## 2011-08-15 NOTE — Telephone Encounter (Signed)
Ok to stop plavix 5 days before then back on plavix

## 2011-08-15 NOTE — Telephone Encounter (Signed)
Notified patient need to come off plavix 5 days prior to the procedure.

## 2011-08-31 IMAGING — CR DG CHEST 2V
2 series · 2 of 2 positions shown · non-contrast
Comparison: 11/12/2010

CLINICAL DATA: Post CABG

CHEST - 2 VIEW

[view not recorded (1 of 2)]
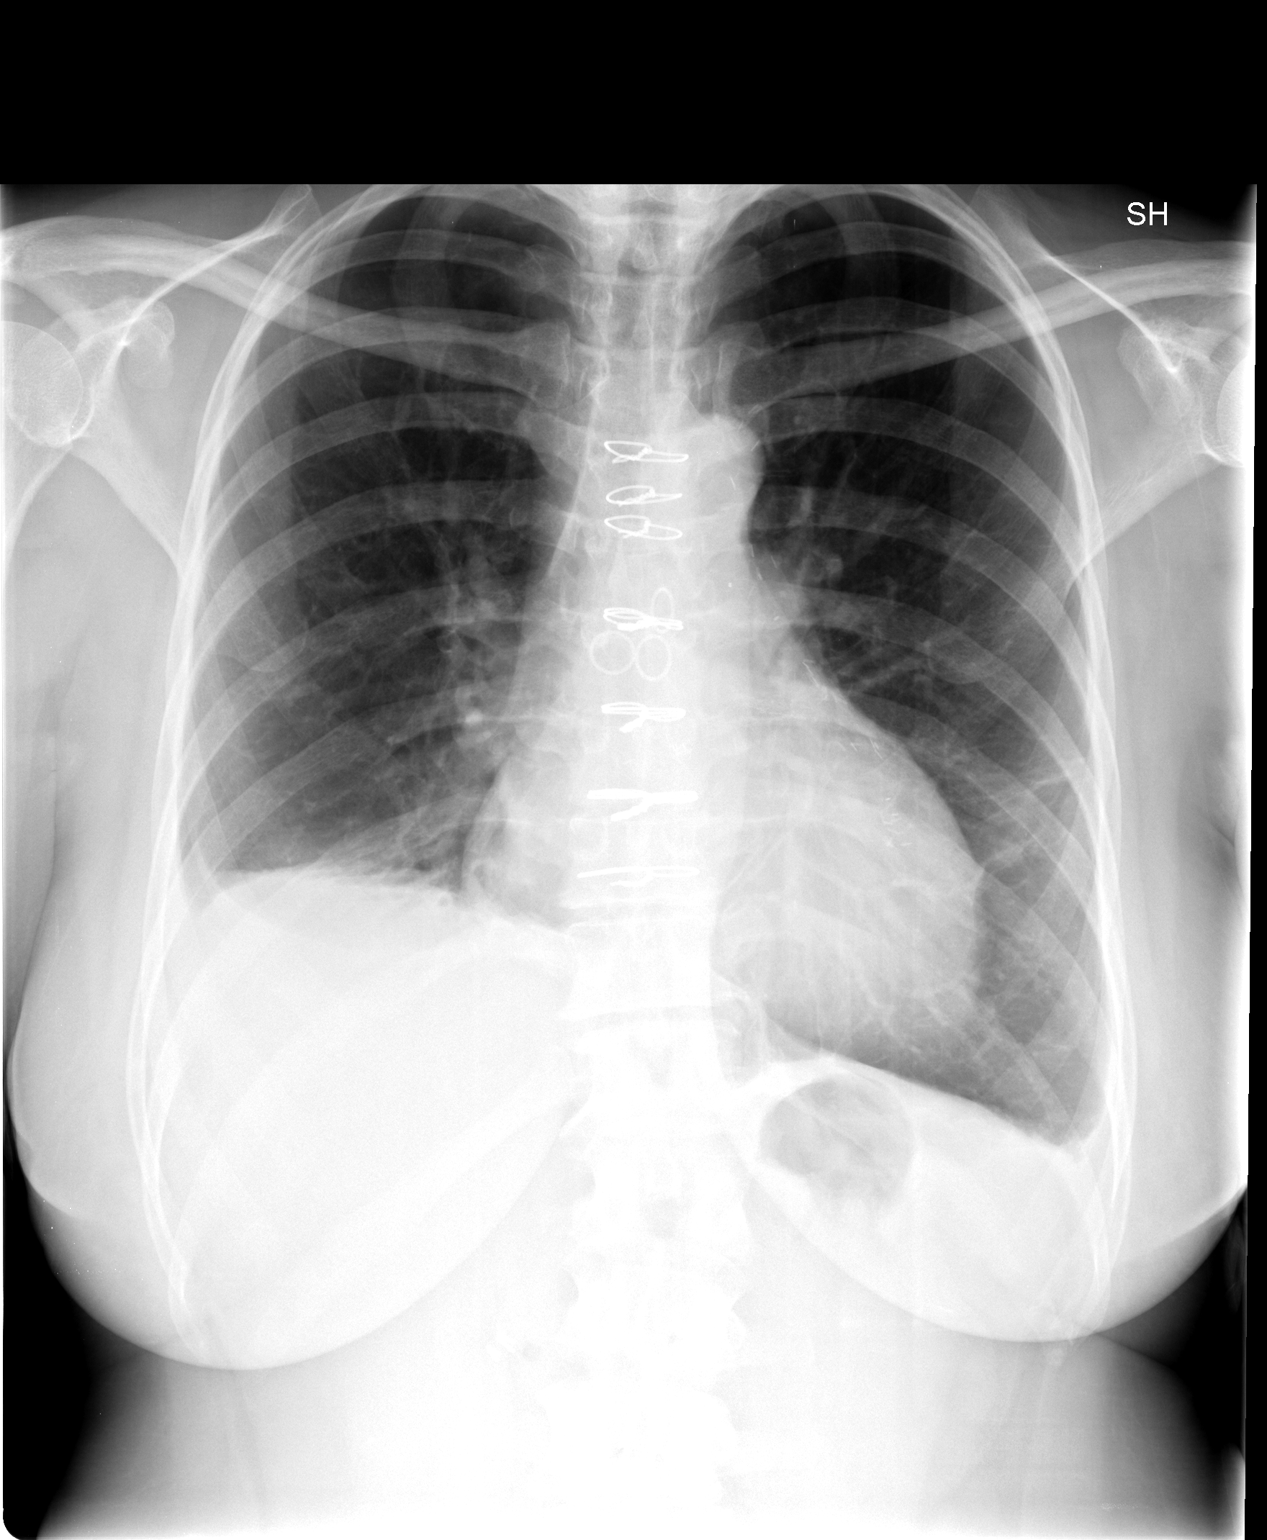

[view not recorded (2 of 2)]
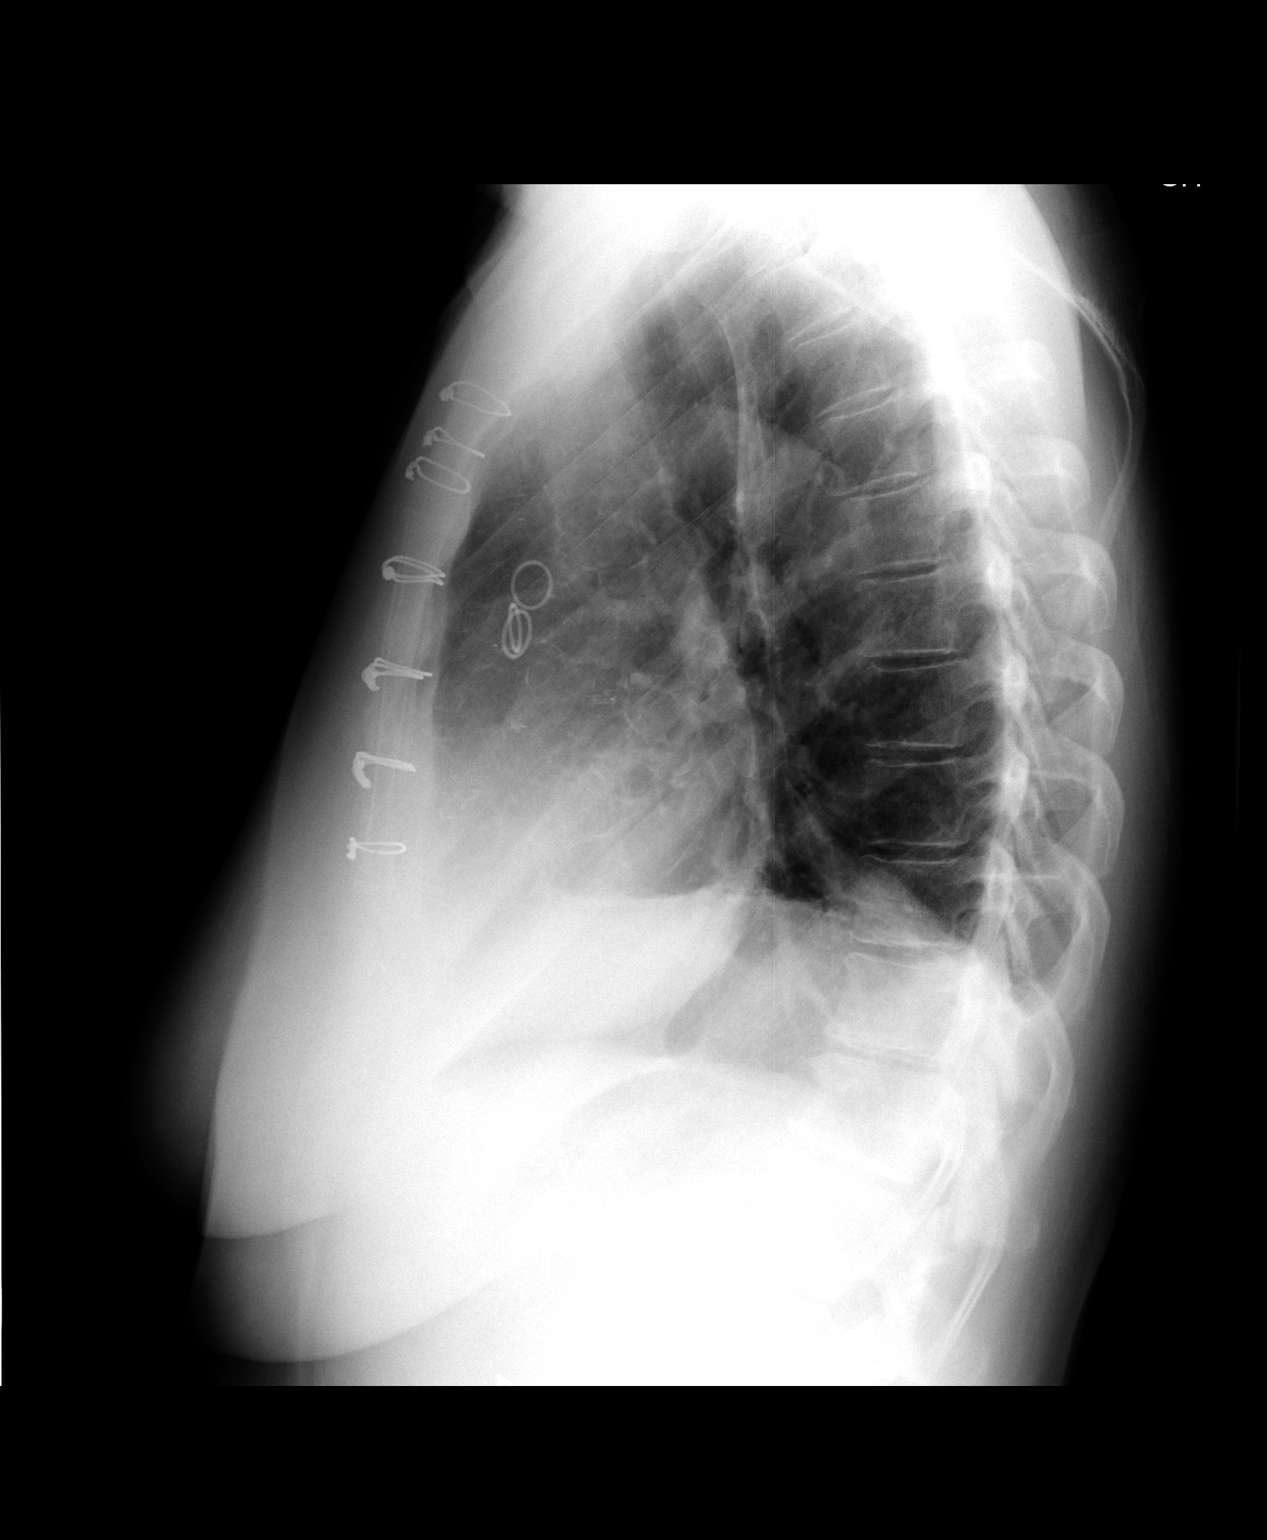

[2 of 2 positions shown; findings below may reference images not displayed]

FINDINGS: Right lower lobe aeration improved.  There is still some
minimal residual atelectasis and pleural reaction.  Left lung clear
although there may be minimal left pleural fluid.  The heart is
slightly enlarged but there is no congestive heart failure.
IMPRESSION: 1.  Right lower lobe aeration significantly improved but still some
residual atelectasis and pleural reaction present.
2.  Heart slightly enlarged without congestive heart failure.

## 2011-09-07 ENCOUNTER — Other Ambulatory Visit: Payer: Self-pay | Admitting: Cardiovascular Disease

## 2011-10-02 ENCOUNTER — Other Ambulatory Visit: Payer: Self-pay | Admitting: Cardiovascular Disease

## 2011-10-02 DIAGNOSIS — I70219 Atherosclerosis of native arteries of extremities with intermittent claudication, unspecified extremity: Secondary | ICD-10-CM

## 2011-10-02 MED ORDER — CLOPIDOGREL BISULFATE 75 MG PO TABS: 75.0000 mg | ORAL_TABLET | Freq: Every day | ORAL | Status: DC

## 2011-10-02 NOTE — Telephone Encounter (Signed)
Refill sent for plavix  

## 2011-10-23 ENCOUNTER — Ambulatory Visit: Payer: Self-pay | Admitting: Cardiovascular Disease

## 2011-10-28 ENCOUNTER — Ambulatory Visit: Payer: Self-pay | Admitting: Cardiovascular Disease

## 2011-11-07 ENCOUNTER — Ambulatory Visit (INDEPENDENT_AMBULATORY_CARE_PROVIDER_SITE_OTHER): Payer: Self-pay | Admitting: Cardiovascular Disease

## 2011-11-07 ENCOUNTER — Encounter: Payer: Self-pay | Admitting: Cardiovascular Disease

## 2011-11-07 VITALS — BP 110/80 | HR 61 | Ht 66.0 in | Wt 177.5 lb

## 2011-11-07 DIAGNOSIS — R0989 Other specified symptoms and signs involving the circulatory and respiratory systems: Secondary | ICD-10-CM

## 2011-11-07 DIAGNOSIS — R0602 Shortness of breath: Secondary | ICD-10-CM

## 2011-11-07 DIAGNOSIS — Z951 Presence of aortocoronary bypass graft: Secondary | ICD-10-CM

## 2011-11-07 DIAGNOSIS — Z87891 Personal history of nicotine dependence: Secondary | ICD-10-CM

## 2011-11-07 DIAGNOSIS — I70219 Atherosclerosis of native arteries of extremities with intermittent claudication, unspecified extremity: Secondary | ICD-10-CM

## 2011-11-07 DIAGNOSIS — I10 Essential (primary) hypertension: Secondary | ICD-10-CM

## 2011-11-07 DIAGNOSIS — I739 Peripheral vascular disease, unspecified: Secondary | ICD-10-CM

## 2011-11-07 DIAGNOSIS — I2510 Atherosclerotic heart disease of native coronary artery without angina pectoris: Secondary | ICD-10-CM

## 2011-11-07 DIAGNOSIS — M542 Cervicalgia: Secondary | ICD-10-CM

## 2011-11-07 DIAGNOSIS — E785 Hyperlipidemia, unspecified: Secondary | ICD-10-CM

## 2011-11-07 MED ORDER — METOPROLOL TARTRATE 25 MG PO TABS: 25.0000 mg | ORAL_TABLET | Freq: Two times a day (BID) | ORAL | Status: DC

## 2011-11-07 MED ORDER — CLOPIDOGREL BISULFATE 75 MG PO TABS: 75.0000 mg | ORAL_TABLET | Freq: Every day | ORAL | Status: DC

## 2011-11-07 NOTE — Progress Notes (Signed)
Patient ID: [name], female    DOB: 1961-05-21, 51 y.o.   MRN: [id number]  HPI Comments: 51 yo woman  with a long smoking history,  h/o CAD s/p  NSTEMI at [facility]  1/12, with BMS to pLAD (80% stenosis), residual severe stenosis of the RCA which is a nondominant small vessel, With continued chest pain, repeat cardiac catheterization showing distal left main stenosis involving a high grade ostial LAD stenosis and circumflex disease approximately 90%, 90% diagonal disease, 80% nondominant RCA disease with ejection fraction 45%, who underwent bypass surgery x4 with a LIMA to the LAD, vein graft to the diagonal, vein graft to the marginal and vein graft to the RCA,  Also with peripheral vascular disease, S/p atherectomy and PTA of her right SFA on 01/15/11,  who presents for routine followup.   She presents today and reports that she Continues to have significant pain radiating from her neck to her right arm with right hand weakness. She reports having degenerative disc disease around C5 and C6. She was told not to have surgery secondary to the limited benefit from neck surgery and secondary to cardiac risks. Details are  unavailable. She reports that Vicodin does not work for her. She has been taking Aleve on a regular basis. She no longer smokes though does have exposure to secondhand smoke.  She has had rare episodes of chest pain requiring nitroglycerin and extra aspirin. She is limited in her ability to exert herself secondary to right leg pain. She feels her blockages coming back on the right as she has discomfort when she walks below the knee. She has not been back at work secondary to her neck and arm pain on the right.  EKG shows normal sinus rhythm with rate 60 beats per minute,  T-wave abnormality in V3 to V6, 2, 3, aVF ( unchanged from previous EKG)  Outpatient Encounter Prescriptions as of 11/07/2011  Medication Sig Dispense Refill  . aspirin 81 MG tablet Take 1 tablet (81 mg total) by mouth  daily.  30 tablet  0  . Calcium-Magnesium (CALCIUM MAGNESIUM 750) 300-300 MG TABS Take by mouth.        . calcium-vitamin D (OSCAL WITH D) 500-200 MG-UNIT per tablet Take 1 tablet by mouth daily.        . clopidogrel (PLAVIX) 75 MG tablet Take 1 tablet (75 mg total) by mouth daily.  30 tablet  11  . HYDROcodone-acetaminophen (NORCO) 5-325 MG per tablet Take 1 tablet by mouth every 6 (six) hours as needed.      [name] lisinopril (PRINIVIL,ZESTRIL) 5 MG tablet TAKE ONE TABLET BY MOUTH EVERY DAY  30 tablet  6  . metoprolol tartrate (LOPRESSOR) 25 MG tablet Take 1 tablet (25 mg total) by mouth 2 (two) times daily.  60 tablet  11  . Multiple Vitamin (MULTIVITAMIN) tablet Take 1 tablet by mouth daily.        . nitroGLYCERIN (NITROSTAT) 0.4 MG SL tablet Place 0.4 mg under the tongue every 5 (five) minutes as needed.        . simvastatin (ZOCOR) 40 MG tablet Take 1 tablet (40 mg total) by mouth at bedtime.  30 tablet  6   Review of Systems  Constitutional: Negative.   HENT: Negative.   Eyes: Negative.   Respiratory: Negative.   Cardiovascular: Positive for chest pain.  Gastrointestinal: Negative.   Musculoskeletal: Positive for myalgias, back pain, joint swelling and arthralgias.  Skin: Negative.   Neurological: Negative.  Hematological: Negative.   Psychiatric/Behavioral: Negative.   All other systems reviewed and are negative.    BP 110/80  Pulse 61  Ht 5\' 6"  (1.676 m)  Wt 177 lb 8 oz (80.513 kg)  BMI 28.65 kg/m2   Physical Exam  Nursing note and vitals reviewed. Constitutional: She is oriented to person, place, and time. She appears well-developed and well-nourished.  HENT:  Head: Normocephalic.  Nose: Nose normal.  Mouth/Throat: Oropharynx is clear and moist.  Eyes: Conjunctivae are normal. Pupils are equal, round, and reactive to light.  Neck: Normal range of motion. Neck supple. No JVD present.  Cardiovascular: Normal rate, regular rhythm, S1 normal, S2 normal, normal heart  sounds and intact distal pulses.  Exam reveals no gallop and no friction rub.   No murmur heard. Pulmonary/Chest: Effort normal and breath sounds normal. No respiratory distress. She has no wheezes. She has no rales. She exhibits no tenderness.  Abdominal: Soft. Bowel sounds are normal. She exhibits no distension. There is no tenderness.  Musculoskeletal: Normal range of motion. She exhibits no edema and no tenderness.  Lymphadenopathy:    She has no cervical adenopathy.  Neurological: She is alert and oriented to person, place, and time. Coordination normal.       Full neurologic exam was not completed secondary to pain in the right arm with movement.  Skin: Skin is warm and dry. No rash noted. No erythema.  Psychiatric: She has a normal mood and affect. Her behavior is normal. Judgment and thought content normal.         Assessment and Plan

## 2011-11-07 NOTE — Assessment & Plan Note (Signed)
She does have pain in her right lower extremity concerning for recurrence of her PAD. We will await on ordering ABIs until she has insurance coverage.

## 2011-11-07 NOTE — Assessment & Plan Note (Signed)
She will need a routine cholesterol check when she sees Dr. Maryellen Pile for routine annual exam. She currently does not have any insurance coverage to cover her labs. We have recommended she stay on her statin.

## 2011-11-07 NOTE — Assessment & Plan Note (Signed)
For the most part she is doing well with rare episodes of chest pain requiring nitroglycerin and aspirin. No further testing at this time.

## 2011-11-07 NOTE — Patient Instructions (Signed)
You are doing well. No medication changes were made.  Please call us if you have new issues that need to be addressed before your next appt.  Your physician wants you to follow-up in: 6 months.  You will receive a reminder letter in the mail two months in advance. If you don't receive a letter, please call our office to schedule the follow-up appointment.   

## 2011-11-07 NOTE — Assessment & Plan Note (Signed)
She stopped smoking one year ago. We have recommended continued smoking cessation.

## 2011-11-07 NOTE — Assessment & Plan Note (Signed)
Chronic neck pain with continued weakness, pain down the right arm. She is being followed by chronic pain clinic. We have suggested if symptoms progress, that she consider a second opinion for surgical intervention.

## 2011-11-20 ENCOUNTER — Ambulatory Visit: Payer: Self-pay

## 2011-12-19 ENCOUNTER — Encounter: Payer: Self-pay | Admitting: Family Medicine

## 2011-12-19 ENCOUNTER — Ambulatory Visit (INDEPENDENT_AMBULATORY_CARE_PROVIDER_SITE_OTHER): Payer: Self-pay | Admitting: Family Medicine

## 2011-12-19 VITALS — BP 122/78 | HR 72 | Temp 98.3°F | Ht 66.0 in | Wt 179.2 lb

## 2011-12-19 DIAGNOSIS — E785 Hyperlipidemia, unspecified: Secondary | ICD-10-CM

## 2011-12-19 DIAGNOSIS — F329 Major depressive disorder, single episode, unspecified: Secondary | ICD-10-CM

## 2011-12-19 DIAGNOSIS — M25569 Pain in unspecified knee: Secondary | ICD-10-CM

## 2011-12-19 DIAGNOSIS — J309 Allergic rhinitis, unspecified: Secondary | ICD-10-CM

## 2011-12-19 DIAGNOSIS — F1911 Other psychoactive substance abuse, in remission: Secondary | ICD-10-CM

## 2011-12-19 DIAGNOSIS — F32A Depression, unspecified: Secondary | ICD-10-CM

## 2011-12-19 DIAGNOSIS — I10 Essential (primary) hypertension: Secondary | ICD-10-CM

## 2011-12-19 DIAGNOSIS — Z8541 Personal history of malignant neoplasm of cervix uteri: Secondary | ICD-10-CM

## 2011-12-19 DIAGNOSIS — F3289 Other specified depressive episodes: Secondary | ICD-10-CM

## 2011-12-19 DIAGNOSIS — M25561 Pain in right knee: Secondary | ICD-10-CM

## 2011-12-19 DIAGNOSIS — J302 Other seasonal allergic rhinitis: Secondary | ICD-10-CM

## 2011-12-19 DIAGNOSIS — K219 Gastro-esophageal reflux disease without esophagitis: Secondary | ICD-10-CM

## 2011-12-19 DIAGNOSIS — I5022 Chronic systolic (congestive) heart failure: Secondary | ICD-10-CM

## 2011-12-19 DIAGNOSIS — Z87891 Personal history of nicotine dependence: Secondary | ICD-10-CM

## 2011-12-19 DIAGNOSIS — I779 Disorder of arteries and arterioles, unspecified: Secondary | ICD-10-CM

## 2011-12-19 DIAGNOSIS — I2510 Atherosclerotic heart disease of native coronary artery without angina pectoris: Secondary | ICD-10-CM

## 2011-12-19 DIAGNOSIS — I509 Heart failure, unspecified: Secondary | ICD-10-CM

## 2011-12-19 DIAGNOSIS — M542 Cervicalgia: Secondary | ICD-10-CM

## 2011-12-19 DIAGNOSIS — I739 Peripheral vascular disease, unspecified: Secondary | ICD-10-CM

## 2011-12-19 MED ORDER — HYDROCODONE-ACETAMINOPHEN 5-325 MG PO TABS: 1.0000 | ORAL_TABLET | Freq: Four times a day (QID) | ORAL | Status: DC | PRN

## 2011-12-19 NOTE — Progress Notes (Signed)
Subjective:    Patient ID: [name], female    DOB: January 26, 1961, 51 y.o.   MRN: [id number]  HPI CC: new pt, establish  [facility] you with h/o HTN, HLD, PAD (s/p right sided SFA atherectomy and PTA 12/2010), CAD (s/p MIs, stents, and CABG 2012), h/o cervical cancer s/p cryotherapy per pt, and h/o CHF in chart, anemia, GERD, h/o smoking and drug abuse presents to establish care.  Prior saw Dr. Thana Ates.  Wants to transfer care.  Family comes to this office.  Presents with sister today.  Sees Dr. Mariah Ward for extensive CAD hx - 2003 first MI with stent placement, then again 2007.  Another stent placed 2012, then CABG 2012 (Dr. Alla German).  Last saw cards last month.  Intermittent chest pain, but unable to tell if coming from neck or from heart.  Feels chest pain stable currently, controlled with extra doses of aspirin and nitro.  Actually hasn't needed nitroglycerin in last several months.  PAD - R leg s/p bypass surgery last year.  Feels right leg pain returning.  Has not been able to afford rpt ABIs.  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  Has seen pain management in past.  Unclear if has seen ortho/neurosurgery, but overall has been advised against having neck surgery 2/2 cardiac risk and unclear benefit of surgery.    Endorses diffuse joint pains, recently specifically right knee pain that has worsened over last few weeks.  Tired of hurting.  Tylenol, NSAIDs don't control pain.  Unable to afford pain management but has seen Dr. Welton Flakes in past.  Recently denied disability, has hired Clinical research associate.  Has seen disability doctor (Dr. Maryellen Pile), I will request records.  Former smoker, quit 07/2010.  H/o drug abuse remotely (crack cocaine and marijuana).  Clean for 24 years.  Moved from [place] and now close to supportive family.  Lives alone but has at least 2 sisters nearby.  One helps her with affording medications, the other  one helps her with affording other medical care.   Preventative: No recent physical. colonsocopy 2007, with polyps. No recent mammogram, pap, etc.  Medications and allergies reviewed and updated in chart.  Past histories reviewed and updated if relevant as below. Patient Active Problem List  Diagnoses  . HYPERLIPIDEMIA-MIXED  . Coronary atherosclerosis of native coronary artery  . CLAUDICATION  . CAROTID BRUIT  . SHORTNESS OF BREATH  . S/P CABG x 4  . Smoking history  . PAD (peripheral artery disease)  . Neck pain  . Systolic CHF, chronic  . History of cervical cancer  . Depression  . GERD (gastroesophageal reflux disease)  . Seasonal allergies  . History of drug abuse   Past Medical History  Diagnosis Date  . Coronary artery disease     PCI of left circumflex 2003, PCI of the LAD 2012 with a bare-metal stent (2.5 x 8 mm);   s/p CABG 4/12:  L-LAD, S-Dx, S-OM, S-RCA (Dr. Donata Clay)  . Hyperlipidemia   . Hypertension   . PAD (peripheral artery disease)     s/p Right SFA atherectomy and PTA 01/15/11  . Brachial neuritis or radiculitis NOS   . Systolic CHF, chronic     mild: echo 08/2010 - mildly reduced EF 40-45%, mild diffuse hypokinesis  . Anemia   . Cocaine abuse, in remission     x 24 years  . Arthritis   . Blood in stool   .  History of cervical cancer     s/p cryotherapy  . Depression   . Generalized headaches     frequent  . GERD (gastroesophageal reflux disease)   . Seasonal allergies   . Cardiac arrhythmia   . Heart murmur   . History of hepatitis B     from eating undercooked liver  . History of kidney stones   . Urine incontinence   . History of drug abuse     cocaine, marijuana, clean since 1989  . Smoking history     quit 07/2010   Past Surgical History  Procedure Date  . Coronary artery bypass graft 2012  . Cholecystectomy 1977  . Coronary stent placement 2003    S/P MI  . Coronary stent placement 2007    [place]  . Femoral artery stent  10/2010    right sided (Dr. Excell Seltzer)   History  Substance Use Topics  . Smoking status: Former Smoker -- 1.0 packs/day for 38 years    Quit date: 08/12/2010  . Smokeless tobacco: Never Used  . Alcohol Use: No   Family History  Problem Relation Age of Onset  . Hypertension Father   . Heart failure Father   . Cancer Mother     breast  . Cancer Father     colon, prostate  . Diabetes Father   . Coronary artery disease Neg Hx   . Stroke Neg Hx    Allergies  Allergen Reactions  . Latex     Rash   . Shellfish Allergy     Hard shellfish/swelling in throat  . Sulfonamide Derivatives     Swelling in throat.   Current Outpatient Prescriptions on File Prior to Visit  Medication Sig Dispense Refill  . clopidogrel (PLAVIX) 75 MG tablet Take 1 tablet (75 mg total) by mouth daily.  30 tablet  11  . furosemide (LASIX) 20 MG tablet Take 20 mg by mouth daily as needed. swelling      . lisinopril (PRINIVIL,ZESTRIL) 5 MG tablet TAKE ONE TABLET BY MOUTH EVERY DAY  30 tablet  6  . metoprolol tartrate (LOPRESSOR) 25 MG tablet Take 1 tablet (25 mg total) by mouth 2 (two) times daily.  60 tablet  11  . nitroGLYCERIN (NITROSTAT) 0.4 MG SL tablet Place 0.4 mg under the tongue every 5 (five) minutes as needed.        . simvastatin (ZOCOR) 40 MG tablet Take 1 tablet (40 mg total) by mouth at bedtime.  30 tablet  6     Review of Systems  Constitutional: Positive for appetite change. Negative for fever, chills, activity change, fatigue and unexpected weight change.  HENT: Negative for hearing loss and neck pain.   Eyes: Negative for visual disturbance.  Respiratory: Positive for cough, chest tightness and shortness of breath. Negative for wheezing.   Cardiovascular: Positive for chest pain and leg swelling. Negative for palpitations.  Gastrointestinal: Positive for nausea, vomiting, abdominal pain, diarrhea, constipation and blood in stool (last 4 mo ago, colonoscopy 6 yrs ago told polyps, rec rpt 5  yrs). Negative for abdominal distention.  Genitourinary: Negative for hematuria and difficulty urinating.  Musculoskeletal: Negative for myalgias and arthralgias.  Skin: Negative for rash.  Neurological: Positive for headaches. Negative for dizziness, seizures and syncope.  Hematological: Bruises/bleeds easily.  Psychiatric/Behavioral: Positive for dysphoric mood. The patient is nervous/anxious.        Objective:   Physical Exam  Nursing note and vitals reviewed. Constitutional: She is oriented to person,  place, and time. She appears well-developed and well-nourished. No distress.  HENT:  Head: Normocephalic and atraumatic.  Right Ear: External ear normal.  Left Ear: External ear normal.  Nose: Nose normal.  Mouth/Throat: Oropharynx is clear and moist. No oropharyngeal exudate.  Eyes: Conjunctivae and EOM are normal. Pupils are equal, round, and reactive to light. No scleral icterus.  Neck: Normal range of motion. Neck supple. Carotid bruit is not present. No thyromegaly present.  Cardiovascular: Normal rate, regular rhythm, normal heart sounds and intact distal pulses.   No murmur heard. Pulses:      Radial pulses are 2+ on the right side, and 2+ on the left side.  Pulmonary/Chest: Effort normal and breath sounds normal. No respiratory distress. She has no wheezes. She has no rales.  Abdominal: Soft. Bowel sounds are normal. She exhibits no distension and no mass. There is no tenderness. There is no rebound and no guarding.  Musculoskeletal: Normal range of motion. She exhibits no edema.       Right knee overall good range of motion but tender to palpation at suprapatellar region.  Effusion present on right side but no warmth, erythema. diminished peripheral pulses BLE  Lymphadenopathy:    She has no cervical adenopathy.  Neurological: She is alert and oriented to person, place, and time.       CN grossly intact, station and gait intact  Skin: Skin is warm. No rash noted. She is  diaphoretic.  Psychiatric: She has a normal mood and affect. Her behavior is normal. Judgment and thought content normal.       Assessment & Plan:

## 2011-12-19 NOTE — Patient Instructions (Signed)
I have requested records from Dr. Thana Ates as well as Dr. Maryellen Pile [facility] in [place]) and Dr. Rowe Pavy [facility] pain management). I want you to take tylenol 500mg  three times a day every day as well as aleve twice a day every day (if this was ok by Dr. Nira Retort). Hydrocodone for breakthrough pain provided today. Return in 1 month for follow up.

## 2011-12-20 ENCOUNTER — Encounter: Payer: Self-pay | Admitting: Family Medicine

## 2011-12-20 DIAGNOSIS — M25561 Pain in right knee: Secondary | ICD-10-CM | POA: Insufficient documentation

## 2011-12-20 DIAGNOSIS — I10 Essential (primary) hypertension: Secondary | ICD-10-CM

## 2011-12-20 DIAGNOSIS — J302 Other seasonal allergic rhinitis: Secondary | ICD-10-CM | POA: Insufficient documentation

## 2011-12-20 DIAGNOSIS — K219 Gastro-esophageal reflux disease without esophagitis: Secondary | ICD-10-CM | POA: Insufficient documentation

## 2011-12-20 DIAGNOSIS — I5022 Chronic systolic (congestive) heart failure: Secondary | ICD-10-CM | POA: Insufficient documentation

## 2011-12-20 DIAGNOSIS — Z8541 Personal history of malignant neoplasm of cervix uteri: Secondary | ICD-10-CM | POA: Insufficient documentation

## 2011-12-20 DIAGNOSIS — F329 Major depressive disorder, single episode, unspecified: Secondary | ICD-10-CM | POA: Insufficient documentation

## 2011-12-20 DIAGNOSIS — F1911 Other psychoactive substance abuse, in remission: Secondary | ICD-10-CM | POA: Insufficient documentation

## 2011-12-20 DIAGNOSIS — F32A Depression, unspecified: Secondary | ICD-10-CM | POA: Insufficient documentation

## 2011-12-20 HISTORY — DX: Essential (primary) hypertension: I10

## 2011-12-20 HISTORY — DX: Pain in right knee: M25.561

## 2011-12-20 NOTE — Assessment & Plan Note (Signed)
Due for pap.  will need to schedule for CPE in near future. Will recommend she look into mammogram scholarships as well as free pap screening.

## 2011-12-20 NOTE — Assessment & Plan Note (Signed)
Chronic. Good control, continue current regimen.

## 2011-12-20 NOTE — Assessment & Plan Note (Signed)
S/p CABG for CAD, with continued intermittent chest pains but possibly coming from cervical DDD. Pt overall feels doing well from cardiac standpoint, has close f/u with cardiology.   Good control of blood pressure today.  Lab Results  Component Value Date   LDLCALC 149* 07/11/2011

## 2011-12-20 NOTE — Assessment & Plan Note (Signed)
Stable currently

## 2011-12-20 NOTE — Assessment & Plan Note (Signed)
Effusion present - recommend regular aleve OTC as well as resting knee.  If worsening, to return to see me. No concern for infection today.

## 2011-12-20 NOTE — Assessment & Plan Note (Signed)
Due for checkup.  On simvastatin but not at goal as of last check 06/2011.

## 2011-12-20 NOTE — Assessment & Plan Note (Signed)
Encouraged continued abstinence. 

## 2011-12-20 NOTE — Assessment & Plan Note (Signed)
Per pt known cervical DDD.  No imaging or records of this in system. Will start by requesting records from prior PCP as well as pain clinic and recent disability evaluation. Discussed I am not a disability doctor or pain management doctor. Will fill 1 mo hydrocodone but recommended pt start with tylenol/aleve (states taken this and tolerating), use narcotic for breakthrough pain only.

## 2011-12-27 ENCOUNTER — Encounter: Payer: Self-pay | Admitting: Family Medicine

## 2011-12-30 ENCOUNTER — Telehealth: Payer: Self-pay | Admitting: Cardiovascular Disease

## 2011-12-30 ENCOUNTER — Other Ambulatory Visit: Payer: Self-pay

## 2011-12-30 DIAGNOSIS — M79606 Pain in leg, unspecified: Secondary | ICD-10-CM

## 2011-12-30 NOTE — Telephone Encounter (Signed)
Pt called stating that she now has medicaid and would like to speak to nurse concerning test that Dr [name] wanted her to have done once she had medicaid

## 2011-12-30 NOTE — Telephone Encounter (Signed)
Spoke with pt.'s sister. She explains pt now has medicaid and can have "test" Dr. [name] was discussing at last OV.  He mentions ABI's in his note.  Will schedule pt for ABI's in office since she now has medicaid.  jamie will call sister back with appt date/time.

## 2012-01-07 ENCOUNTER — Other Ambulatory Visit: Payer: Self-pay

## 2012-01-07 ENCOUNTER — Telehealth: Payer: Self-pay | Admitting: Cardiovascular Disease

## 2012-01-07 MED ORDER — POTASSIUM CHLORIDE CRYS ER 20 MEQ PO TBCR: EXTENDED_RELEASE_TABLET | ORAL | Status: DC

## 2012-01-07 NOTE — Telephone Encounter (Signed)
Pt c/o worsening LE edema and orthopnea. Weight went from 172 pounds to 189 pounds in a few days. Pt denies acute distress. She denies eating more salt than usual.  No med changes  She has lasix to take PRN edema/fluid retention but thinks she used to take potassium with this. She no longer has potassium/RX. She is allergic to banannas.  Her appt with Dr. [name] is not until 6/27.  I will discuss with him this am and call her back.

## 2012-01-07 NOTE — Telephone Encounter (Signed)
Patient has an appt w/ [name] on 01/22/12 but wanted to speak w/RN re: her legs swelling.  She also says that she is concerned about her breathing.

## 2012-01-07 NOTE — Telephone Encounter (Signed)
"  have pt take lasix 20 mg BID x 3 days then resume. May take potassium chloride 20 meq PO QD x 3days while taking lasix". V.O Dr. Alvis Lemmings, RN  Pt informed. Understanding verb. RX sent to pharm.

## 2012-01-14 ENCOUNTER — Other Ambulatory Visit: Payer: Self-pay | Admitting: Cardiovascular Disease

## 2012-01-22 ENCOUNTER — Encounter: Payer: Self-pay | Admitting: Cardiovascular Disease

## 2012-01-22 ENCOUNTER — Ambulatory Visit (INDEPENDENT_AMBULATORY_CARE_PROVIDER_SITE_OTHER): Payer: Medicaid Other | Admitting: Cardiovascular Disease

## 2012-01-22 VITALS — BP 142/82 | HR 62 | Ht 66.0 in | Wt 188.0 lb

## 2012-01-22 DIAGNOSIS — E785 Hyperlipidemia, unspecified: Secondary | ICD-10-CM

## 2012-01-22 DIAGNOSIS — Z87891 Personal history of nicotine dependence: Secondary | ICD-10-CM

## 2012-01-22 DIAGNOSIS — I509 Heart failure, unspecified: Secondary | ICD-10-CM

## 2012-01-22 DIAGNOSIS — I739 Peripheral vascular disease, unspecified: Secondary | ICD-10-CM

## 2012-01-22 DIAGNOSIS — I5022 Chronic systolic (congestive) heart failure: Secondary | ICD-10-CM

## 2012-01-22 DIAGNOSIS — Z951 Presence of aortocoronary bypass graft: Secondary | ICD-10-CM

## 2012-01-22 DIAGNOSIS — R079 Chest pain, unspecified: Secondary | ICD-10-CM

## 2012-01-22 MED ORDER — ROSUVASTATIN CALCIUM 40 MG PO TABS: 40.0000 mg | ORAL_TABLET | Freq: Every day | ORAL | Status: DC

## 2012-01-22 MED ORDER — EZETIMIBE 10 MG PO TABS: 10.0000 mg | ORAL_TABLET | Freq: Every day | ORAL | Status: DC

## 2012-01-22 NOTE — Progress Notes (Signed)
Patient ID: [name], female    DOB: Nov 29, 1960, 51 y.o.   MRN: [id number]  HPI Comments: 51 yo woman  with a long smoking history,  h/o CAD s/p  NSTEMI at [facility]  1/12, with BMS to pLAD (80% stenosis), residual severe stenosis of the RCA which is a nondominant small vessel, With continued chest pain, repeat cardiac catheterization showing distal left main stenosis involving a high grade ostial LAD stenosis and circumflex disease approximately 90%, 90% diagonal disease, 80% nondominant RCA disease with ejection fraction 45%, who underwent bypass surgery x4 with a LIMA to the LAD, vein graft to the diagonal, vein graft to the marginal and vein graft to the RCA,  Also with peripheral vascular disease, S/p atherectomy and PTA of her right SFA on 01/15/11,  who presents for routine followup.    she continues to have significant pain radiating from her neck to her right arm with right hand weakness. She reports having degenerative disc disease around C5 and C6. She no longer smokes. She continues to have right leg pain, left shoulder pain, radiating to her left scapula. She has problems with swallowing and is scheduled for an EGD. She was told recently that her thyroid lab is not right and needs to be actively managed. She was also told cholesterol was very high. She has swelling and tenderness in her legs bilaterally.  She has had rare episodes of chest pain requiring nitroglycerin and extra aspirin. She is limited in her ability to exert herself secondary to right leg pain.   EKG shows normal sinus rhythm with rate 62 beats per minute,  T-wave abnormality in V3 to V6, 2, 3, aVF ( unchanged from previous EKG)  Outpatient Encounter Prescriptions as of 01/22/2012  Medication Sig Dispense Refill  . aspirin 162 MG EC tablet Take 162 mg by mouth daily.      . clopidogrel (PLAVIX) 75 MG tablet Take 1 tablet (75 mg total) by mouth daily.  30 tablet  11  . furosemide (LASIX) 20 MG tablet Take 20 mg by mouth  daily as needed. swelling      . HYDROcodone-acetaminophen (NORCO) 5-325 MG per tablet Take 1 tablet by mouth every 6 (six) hours as needed.  30 tablet  0  . lisinopril (PRINIVIL,ZESTRIL) 5 MG tablet TAKE ONE TABLET BY MOUTH EVERY DAY  30 tablet  6  . metoprolol tartrate (LOPRESSOR) 25 MG tablet Take 1 tablet (25 mg total) by mouth 2 (two) times daily.  60 tablet  11  . Multiple Vitamin (MULTIVITAMIN) tablet Take 1 tablet by mouth daily.      . nitroGLYCERIN (NITROSTAT) 0.4 MG SL tablet Place 0.4 mg under the tongue every 5 (five) minutes as needed.        [name] omeprazole (PRILOSEC) 40 MG capsule Take 40 mg by mouth daily.      .  simvastatin (ZOCOR) 40 MG tablet TAKE ONE TABLET BY MOUTH AT BEDTIME  30 tablet  12     Review of Systems  Constitutional: Negative.   HENT: Negative.   Eyes: Negative.   Respiratory: Negative.   Cardiovascular: Positive for chest pain.  Gastrointestinal: Negative.   Musculoskeletal: Positive for myalgias, back pain, joint swelling and arthralgias.  Skin: Negative.   Neurological: Negative.   Hematological: Negative.   Psychiatric/Behavioral: Negative.   All other systems reviewed and are negative.    BP 142/82  Pulse 62  Ht 5\' 6"  (1.676 m)  Wt 188 lb (85.276 kg)  BMI  30.34 kg/m2  Physical Exam  Nursing note and vitals reviewed. Constitutional: She is oriented to person, place, and time. She appears well-developed and well-nourished.  HENT:  Head: Normocephalic.  Nose: Nose normal.  Mouth/Throat: Oropharynx is clear and moist.  Eyes: Conjunctivae are normal. Pupils are equal, round, and reactive to light.  Neck: Normal range of motion. Neck supple. No JVD present.  Cardiovascular: Normal rate, regular rhythm, S1 normal, S2 normal, normal heart sounds and intact distal pulses.  Exam reveals no gallop and no friction rub.   No murmur heard. Pulmonary/Chest: Effort normal and breath sounds normal. No respiratory distress. She has no wheezes. She has no  rales. She exhibits no tenderness.  Abdominal: Soft. Bowel sounds are normal. She exhibits no distension. There is no tenderness.  Musculoskeletal: Normal range of motion. She exhibits no edema and no tenderness.  Lymphadenopathy:    She has no cervical adenopathy.  Neurological: She is alert and oriented to person, place, and time. Coordination normal.  Skin: Skin is warm and dry. No rash noted. No erythema.  Psychiatric: She has a normal mood and affect. Her behavior is normal. Judgment and thought content normal.         Assessment and Plan

## 2012-01-22 NOTE — Assessment & Plan Note (Addendum)
We will schedule her for a right lower extremity ultrasound given history of right SFA  PVD  with previous atherectomy.

## 2012-01-22 NOTE — Assessment & Plan Note (Signed)
We will hold her simvastatin, start Crestor 40 mg daily with zetia 10 mg daily with cholesterol check in 3 months.

## 2012-01-22 NOTE — Patient Instructions (Addendum)
You are doing well. Please start crestor one a day with zetia one a day for cholesterol Stop the simvastatin  We will schedule you for a lower extremity arterial ultrasound for leg pain/claudication, hx of blockage/PAD  Please call us if you have new issues that need to be addressed before your next appt.  Your physician wants you to follow-up in: 6 months.  You will receive a reminder letter in the mail two months in advance. If you don't receive a letter, please call our office to schedule the follow-up appointment.

## 2012-01-22 NOTE — Assessment & Plan Note (Signed)
We have encouraged her to continue smoking cessation  

## 2012-01-22 NOTE — Assessment & Plan Note (Signed)
Currently with no symptoms of angina. No further workup at this time. Continue current medication regimen. 

## 2012-01-22 NOTE — Assessment & Plan Note (Signed)
No signs of active heart failure. We have suggested she continue on her current medication regimen.

## 2012-01-23 ENCOUNTER — Ambulatory Visit: Payer: Self-pay | Admitting: Family Medicine

## 2012-01-23 ENCOUNTER — Telehealth: Payer: Self-pay

## 2012-01-23 NOTE — Telephone Encounter (Signed)
Notified [name] pharmacy prior authorization is good for one year starting 01/23/12 through 01/22/13 for zetia 10 mg and crestor 40 mg.

## 2012-02-12 ENCOUNTER — Encounter (INDEPENDENT_AMBULATORY_CARE_PROVIDER_SITE_OTHER): Payer: Medicaid Other

## 2012-02-12 DIAGNOSIS — R079 Chest pain, unspecified: Secondary | ICD-10-CM

## 2012-02-12 DIAGNOSIS — I739 Peripheral vascular disease, unspecified: Secondary | ICD-10-CM

## 2012-02-14 ENCOUNTER — Inpatient Hospital Stay: Payer: Self-pay | Admitting: Internal Medicine

## 2012-02-14 DIAGNOSIS — I369 Nonrheumatic tricuspid valve disorder, unspecified: Secondary | ICD-10-CM

## 2012-02-14 DIAGNOSIS — R079 Chest pain, unspecified: Secondary | ICD-10-CM

## 2012-02-14 LAB — COMPREHENSIVE METABOLIC PANEL
Alkaline Phosphatase: 74 U/L (ref 50–136)
Anion Gap: 8 (ref 7–16)
BUN: 16 mg/dL (ref 7–18)
Bilirubin,Total: 0.2 mg/dL (ref 0.2–1.0)
Chloride: 106 mmol/L (ref 98–107)
Co2: 27 mmol/L (ref 21–32)
Creatinine: 1.05 mg/dL (ref 0.60–1.30)
EGFR (Non-African Amer.): >60
Potassium: 3.6 mmol/L (ref 3.5–5.1)
SGPT (ALT): 48 U/L

## 2012-02-14 LAB — URINALYSIS, COMPLETE
Bacteria: NONE SEEN
Bilirubin,UR: NEGATIVE
Blood: NEGATIVE
Nitrite: NEGATIVE
Ph: 6 (ref 4.5–8.0)
Protein: NEGATIVE
RBC,UR: NONE SEEN /HPF (ref 0–5)
Squamous Epithelial: <1

## 2012-02-14 LAB — APTT
Activated PTT: 28.8 secs (ref 23.6–35.9)
Activated PTT: >160 secs (ref 23.6–35.9)

## 2012-02-14 LAB — CBC
HCT: 38.3 % (ref 35.0–47.0)
HGB: 12.8 g/dL (ref 12.0–16.0)
MCH: 31.6 pg (ref 26.0–34.0)
MCHC: 33.5 g/dL (ref 32.0–36.0)
MCV: 94 fL (ref 80–100)
RBC: 4.07 10*6/uL (ref 3.80–5.20)
RDW: 13.9 % (ref 11.5–14.5)
WBC: 10.8 10*3/uL (ref 3.6–11.0)

## 2012-02-14 LAB — PRO B NATRIURETIC PEPTIDE: B-Type Natriuretic Peptide: 1292 pg/mL — ABNORMAL HIGH (ref 0–125)

## 2012-02-14 LAB — TROPONIN I
Troponin-I: 0.1 ng/mL — ABNORMAL HIGH
Troponin-I: 0.09 ng/mL — ABNORMAL HIGH

## 2012-02-14 LAB — CK
CK, Total: 98 U/L (ref 21–215)
CK, Total: 97 U/L (ref 21–215)

## 2012-02-14 LAB — CK TOTAL AND CKMB (NOT AT ARMC)
CK, Total: 131 U/L (ref 21–215)
CK-MB: 0.8 ng/mL (ref 0.5–3.6)

## 2012-02-14 LAB — PROTIME-INR: INR: 0.9

## 2012-02-16 ENCOUNTER — Other Ambulatory Visit: Payer: Self-pay

## 2012-02-16 ENCOUNTER — Encounter: Payer: Self-pay | Admitting: Cardiovascular Disease

## 2012-02-16 ENCOUNTER — Telehealth: Payer: Self-pay | Admitting: Cardiovascular Disease

## 2012-02-16 DIAGNOSIS — I2510 Atherosclerotic heart disease of native coronary artery without angina pectoris: Secondary | ICD-10-CM

## 2012-02-16 DIAGNOSIS — R079 Chest pain, unspecified: Secondary | ICD-10-CM

## 2012-02-16 NOTE — Telephone Encounter (Signed)
Julien Nordmann, MD 02/16/2012 12:57 PM Signed  Can recall [name] this week  She was in [facility] over the weekend for chest pain  Cardiac enzymes were negative, EKG normal  We need to discuss with her whether she would like a lexiscan Myoview for her symptoms.  Symptoms could have been secondary to GI. She also has sleep apnea.  She has testing for both of these in August.

## 2012-02-16 NOTE — Telephone Encounter (Signed)
Verbal instructions given to pt's caregiver, Mrs. Porfirio Mylar, re: lexi scheduled for Wednesday 7/24. Understanding verb.

## 2012-02-16 NOTE — Telephone Encounter (Signed)
Called and spoke with pt.  She confirms she has appt with Dr. [name] this Friday 7/26. She is ready to pursue the lexiscan myoview d/t chest discomfort.  She asks that I call her sister, [name], to confirm date.  I spoke with Mrs. Tinnin who says this Wednesday 02/18/12 would be great. I will call to arrange.  Trying to get test done prior to OV 7/26.

## 2012-02-16 NOTE — Telephone Encounter (Signed)
Can recall [name] this week She was in [facility] over the weekend for chest pain Cardiac enzymes were negative, EKG normal We need to discuss with her whether she would like a lexiscan Myoview for her symptoms. Symptoms could have been secondary to GI. She also has sleep apnea. She has testing for both of these in August.

## 2012-02-18 ENCOUNTER — Ambulatory Visit: Payer: Self-pay | Admitting: Cardiovascular Disease

## 2012-02-18 DIAGNOSIS — R079 Chest pain, unspecified: Secondary | ICD-10-CM

## 2012-02-20 ENCOUNTER — Ambulatory Visit (INDEPENDENT_AMBULATORY_CARE_PROVIDER_SITE_OTHER): Payer: Medicaid Other | Admitting: Cardiovascular Disease

## 2012-02-20 ENCOUNTER — Encounter: Payer: Self-pay | Admitting: Cardiovascular Disease

## 2012-02-20 VITALS — BP 122/78 | HR 66 | Ht 66.0 in | Wt 184.0 lb

## 2012-02-20 DIAGNOSIS — M79609 Pain in unspecified limb: Secondary | ICD-10-CM

## 2012-02-20 DIAGNOSIS — Z951 Presence of aortocoronary bypass graft: Secondary | ICD-10-CM

## 2012-02-20 DIAGNOSIS — E785 Hyperlipidemia, unspecified: Secondary | ICD-10-CM

## 2012-02-20 DIAGNOSIS — I10 Essential (primary) hypertension: Secondary | ICD-10-CM

## 2012-02-20 DIAGNOSIS — M79602 Pain in left arm: Secondary | ICD-10-CM

## 2012-02-20 MED ORDER — NITROGLYCERIN 0.4 MG SL SUBL: 0.4000 mg | SUBLINGUAL_TABLET | SUBLINGUAL | Status: DC | PRN

## 2012-02-20 NOTE — Progress Notes (Signed)
Patient ID: [name], female    DOB: [birth year], 51 y.o.   MRN: [id number]  HPI Comments: 51 yo woman  with a long smoking history,  h/o CAD s/p  NSTEMI at [facility]  1/12, with BMS to pLAD (80% stenosis), residual severe stenosis of the RCA which is a nondominant small vessel, With continued chest pain, repeat cardiac catheterization showing distal left main stenosis involving a high grade ostial LAD stenosis and circumflex disease approximately 90%, 90% diagonal disease, 80% nondominant RCA disease with ejection fraction 45%, who underwent bypass surgery x4 with a LIMA to the LAD, vein graft to the diagonal, vein graft to the marginal and vein graft to the RCA,  Also with peripheral vascular disease, S/p atherectomy and PTA of her right SFA on 01/15/11,  who presents for routine followup.   Recent admission to the hospital for shortness of breath and chest pain. Echocardiogram showing right ventricular systolic pressure 30-40. She reports having a rattle in her chest. She was treated with diuretics for possible acute diastolic CHF.  Recent outpatient stress test showed significant breast attenuation artifact, inferior wall artifact from GI uptake. Overall no significant ischemia. Her biggest complaint is headaches and nerve type twinges changes in her head. No significant chest pain. It was a concern that she had run out of some of her medications leading to high blood pressure in the hospital  Echocardiogram in the hospital showed normal LV systolic function ejection fraction 50-55%, mild MR, mild to moderate TR with right ventricular systolic pressures 30-40  Chronic neck pain to her right arm with right hand weakness. She reports having degenerative disc disease around C5 and C6. She no longer smokes. right leg pain, left shoulder pain, radiating to her left scapula. She has problems with swallowing and is scheduled for an EGD. She was told recently that her thyroid lab is not right and needs to be  actively managed. She has swelling and tenderness in her legs bilaterally.   EKG shows normal sinus rhythm with rate 65 beats per minute,  T-wave abnormality in V3 to V6, 2, 3, aVF ( unchanged from previous EKG)  Outpatient Encounter Prescriptions as of 02/20/2012  Medication Sig Dispense Refill  . aspirin 162 MG EC tablet Take 162 mg by mouth daily.      . clopidogrel (PLAVIX) 75 MG tablet Take 1 tablet (75 mg total) by mouth daily.  30 tablet  11  . ezetimibe (ZETIA) 10 MG tablet Take 1 tablet (10 mg total) by mouth daily.  30 tablet  11  . furosemide (LASIX) 20 MG tablet Take 20 mg by mouth daily.       [name] HYDROcodone-acetaminophen (NORCO) 5-325 MG per tablet Take 1 tablet by mouth every 6 (six) hours as needed.  30 tablet  0  . isosorbide mononitrate (IMDUR) 30 MG 24 hr tablet Take 30 mg by mouth daily.      [name] lisinopril (PRINIVIL,ZESTRIL) 20 MG tablet Take 20 mg by mouth daily.      . meloxicam (MOBIC) 15 MG tablet Take 15 mg by mouth daily.      . METHYLPREDNISOLONE, PAK, PO Take 4 mg by mouth daily.      . metoprolol tartrate (LOPRESSOR) 25 MG tablet Take 1 tablet (25 mg total) by mouth 2 (two) times daily.  60 tablet  11  . Multiple Vitamin (MULTIVITAMIN) tablet Take 1 tablet by mouth daily.      . nitroGLYCERIN (NITROSTAT) 0.4 MG SL tablet Place 0.4  mg under the tongue every 5 (five) minutes as needed.        [name] omeprazole (PRILOSEC) 40 MG capsule Take 40 mg by mouth daily.      . rosuvastatin (CRESTOR) 40 MG tablet Take 1 tablet (40 mg total) by mouth daily.  30 tablet  11  . nitroGLYCERIN (NITROSTAT) 0.4 MG SL tablet Place 1 tablet (0.4 mg total) under the tongue every 5 (five) minutes as needed for chest pain.  25 tablet  6  . potassium chloride SA (K-DUR,KLOR-CON) 20 MEQ tablet Take 1 tablet daily while taking lasix  30 tablet  3   Review of Systems  Constitutional: Negative.   Eyes: Negative.   Respiratory: Negative.   Gastrointestinal: Negative.   Musculoskeletal: Positive for  myalgias, back pain, joint swelling and arthralgias.  Skin: Negative.   Neurological: Positive for headaches.  Hematological: Negative.   Psychiatric/Behavioral: Negative.   All other systems reviewed and are negative.    BP 122/78  Pulse 66  Ht 5\' 6"  (1.676 m)  Wt 184 lb (83.462 kg)  BMI 29.70 kg/m2  Physical Exam  Nursing note and vitals reviewed. Constitutional: She is oriented to person, place, and time. She appears well-developed and well-nourished.  HENT:  Head: Normocephalic.  Nose: Nose normal.  Mouth/Throat: Oropharynx is clear and moist.  Eyes: Conjunctivae are normal. Pupils are equal, round, and reactive to light.  Neck: Normal range of motion. Neck supple. No JVD present.  Cardiovascular: Normal rate, regular rhythm, S1 normal, S2 normal, normal heart sounds and intact distal pulses.  Exam reveals no gallop and no friction rub.   No murmur heard. Pulmonary/Chest: Effort normal and breath sounds normal. No respiratory distress. She has no wheezes. She has no rales. She exhibits no tenderness.  Abdominal: Soft. Bowel sounds are normal. She exhibits no distension. There is no tenderness.  Musculoskeletal: Normal range of motion. She exhibits no edema and no tenderness.  Lymphadenopathy:    She has no cervical adenopathy.  Neurological: She is alert and oriented to person, place, and time. Coordination normal.  Skin: Skin is warm and dry. No rash noted. No erythema.  Psychiatric: She has a normal mood and affect. Her behavior is normal. Judgment and thought content normal.         Assessment and Plan

## 2012-02-20 NOTE — Patient Instructions (Addendum)
You are doing well. No medication changes were made.  Please call us if you have new issues that need to be addressed before your next appt.  Your physician wants you to follow-up in: 6 months.  You will receive a reminder letter in the mail two months in advance. If you don't receive a letter, please call our office to schedule the follow-up appointment.   

## 2012-02-20 NOTE — Assessment & Plan Note (Signed)
Currently with no symptoms of angina. No further workup at this time. Continue current medication regimen. Recent stress test showing no large regions of ischemia. She has had chronic symptoms in the past. We'll treat with nitroglycerin and isosorbide for worsening symptoms.

## 2012-02-20 NOTE — Assessment & Plan Note (Signed)
Continue Crestor and zetia . Goal of less than 70. We'll need to recheck cholesterol in several months.

## 2012-02-24 ENCOUNTER — Other Ambulatory Visit: Payer: Self-pay | Admitting: Cardiovascular Disease

## 2012-02-24 DIAGNOSIS — I2510 Atherosclerotic heart disease of native coronary artery without angina pectoris: Secondary | ICD-10-CM

## 2012-02-24 DIAGNOSIS — R079 Chest pain, unspecified: Secondary | ICD-10-CM

## 2012-03-02 ENCOUNTER — Ambulatory Visit: Payer: Self-pay | Admitting: Internal Medicine

## 2012-03-15 ENCOUNTER — Telehealth: Payer: Self-pay

## 2012-03-15 MED ORDER — ISOSORBIDE MONONITRATE ER 30 MG PO TB24: 30.0000 mg | ORAL_TABLET | Freq: Every day | ORAL | Status: DC

## 2012-03-15 NOTE — Telephone Encounter (Signed)
Needs a refill sent for imdur 30 mg take one tablet daily.

## 2012-04-07 ENCOUNTER — Other Ambulatory Visit: Payer: Self-pay | Admitting: *Deleted

## 2012-04-07 MED ORDER — FUROSEMIDE 20 MG PO TABS: 20.0000 mg | ORAL_TABLET | Freq: Every day | ORAL | Status: DC

## 2012-04-07 MED ORDER — LISINOPRIL 20 MG PO TABS: 20.0000 mg | ORAL_TABLET | Freq: Every day | ORAL | Status: DC

## 2012-04-07 NOTE — Telephone Encounter (Signed)
Refilled Lisinopril and Furosemide.

## 2012-04-30 ENCOUNTER — Ambulatory Visit: Payer: Medicaid Other | Admitting: Cardiovascular Disease

## 2012-05-05 ENCOUNTER — Ambulatory Visit (INDEPENDENT_AMBULATORY_CARE_PROVIDER_SITE_OTHER): Payer: Medicaid Other | Admitting: Cardiovascular Disease

## 2012-05-05 ENCOUNTER — Encounter: Payer: Self-pay | Admitting: Cardiovascular Disease

## 2012-05-05 VITALS — BP 120/82 | HR 63 | Ht 66.0 in | Wt 192.0 lb

## 2012-05-05 DIAGNOSIS — I739 Peripheral vascular disease, unspecified: Secondary | ICD-10-CM

## 2012-05-05 DIAGNOSIS — I70219 Atherosclerosis of native arteries of extremities with intermittent claudication, unspecified extremity: Secondary | ICD-10-CM

## 2012-05-05 DIAGNOSIS — Z951 Presence of aortocoronary bypass graft: Secondary | ICD-10-CM

## 2012-05-05 DIAGNOSIS — I2510 Atherosclerotic heart disease of native coronary artery without angina pectoris: Secondary | ICD-10-CM

## 2012-05-05 DIAGNOSIS — R079 Chest pain, unspecified: Secondary | ICD-10-CM

## 2012-05-05 DIAGNOSIS — E785 Hyperlipidemia, unspecified: Secondary | ICD-10-CM

## 2012-05-05 DIAGNOSIS — R0602 Shortness of breath: Secondary | ICD-10-CM

## 2012-05-05 DIAGNOSIS — I10 Essential (primary) hypertension: Secondary | ICD-10-CM

## 2012-05-05 MED ORDER — EZETIMIBE 10 MG PO TABS: 10.0000 mg | ORAL_TABLET | Freq: Every day | ORAL | Status: DC

## 2012-05-05 MED ORDER — ROSUVASTATIN CALCIUM 40 MG PO TABS: 40.0000 mg | ORAL_TABLET | Freq: Every day | ORAL | Status: DC

## 2012-05-05 MED ORDER — LISINOPRIL 20 MG PO TABS: 20.0000 mg | ORAL_TABLET | Freq: Every day | ORAL | Status: DC

## 2012-05-05 MED ORDER — POTASSIUM CHLORIDE CRYS ER 20 MEQ PO TBCR: EXTENDED_RELEASE_TABLET | ORAL | Status: DC

## 2012-05-05 MED ORDER — FUROSEMIDE 20 MG PO TABS: 20.0000 mg | ORAL_TABLET | Freq: Every day | ORAL | Status: DC

## 2012-05-05 MED ORDER — METOPROLOL TARTRATE 25 MG PO TABS: 25.0000 mg | ORAL_TABLET | Freq: Two times a day (BID) | ORAL | Status: DC

## 2012-05-05 MED ORDER — CLOPIDOGREL BISULFATE 75 MG PO TABS: 75.0000 mg | ORAL_TABLET | Freq: Every day | ORAL | Status: DC

## 2012-05-05 MED ORDER — ISOSORBIDE MONONITRATE ER 30 MG PO TB24: 30.0000 mg | ORAL_TABLET | Freq: Every day | ORAL | Status: DC

## 2012-05-05 NOTE — Patient Instructions (Addendum)
You are doing well. Please try taking metoprolol twice a day If chest pain gets worse, you could try extra isosorbide twice a day, 1/2 NTG pill   Try ranexa 500 mg twice a day for 5 days,  Then 1000 mg ranexa for 5 days If you feel better continue the medication  Please call us if you have new issues that need to be addressed before your next appt.  Your physician wants you to follow-up in: 6 months.  You will receive a reminder letter in the mail two months in advance. If you don't receive a letter, please call our office to schedule the follow-up appointment.

## 2012-05-05 NOTE — Assessment & Plan Note (Signed)
She needs a repeat lipid panel. Goal LDL less than 70.

## 2012-05-05 NOTE — Assessment & Plan Note (Signed)
Blood pressure is well controlled on today's visit. No changes made to the medications. 

## 2012-05-05 NOTE — Assessment & Plan Note (Signed)
Uncertain if some of her chest pain symptoms are postsurgical. We'll continue to monitor.

## 2012-05-05 NOTE — Progress Notes (Signed)
Patient ID: [name], female    DOB: 1960-08-31, 51 y.o.   MRN: [id number]  HPI Comments: 51 yo woman  with a long smoking history,  h/o CAD s/p  NSTEMI at [facility]  1/12, with BMS to pLAD (80% stenosis), residual severe stenosis of the RCA which is a nondominant small vessel, With continued chest pain, repeat cardiac catheterization showing distal left main stenosis involving a high grade ostial LAD stenosis and circumflex disease approximately 90%, 90% diagonal disease, 80% nondominant RCA disease with ejection fraction 45%, who underwent bypass surgery x4 with a LIMA to the LAD, vein graft to the diagonal, vein graft to the marginal and vein graft to the RCA,  Also with peripheral vascular disease, S/p atherectomy and PTA of her right SFA on 01/15/11,  who presents for routine followup.   Prior  outpatient stress test showed significant breast attenuation artifact, inferior wall artifact from GI uptake. Overall no significant ischemia.  She has had chronic left arm pain and sees the pain clinic. Prior diagnosis of carpal tunnel syndrome  She does have occasional left breast pain radiating to her mediastinum. This is rare, possibly once per week. She's not taking nitroglycerin as symptoms are not severe. Symptoms have been chronic and not progressing.  She is seeing ear nose throat for vestibular testing as a cause of her dizziness. She is scheduled for more testing Also scheduled for EGD and colonoscopy. Prior problems with swallowing Continues to have tenderness and swelling of her legs bilaterally.  Echocardiogram in the hospital showed normal LV systolic function ejection fraction 50-55%, mild MR, mild to moderate TR with right ventricular systolic pressures 30-40  EKG shows normal sinus rhythm,  T-wave abnormality in V3 to V6, 2, 3, aVF ( unchanged from previous EKG)  Outpatient Encounter Prescriptions as of 05/05/2012  Medication Sig Dispense Refill  . aspirin 162 MG EC tablet Take 162 mg  by mouth daily.      . clopidogrel (PLAVIX) 75 MG tablet Take 1 tablet (75 mg total) by mouth daily.  90 tablet  3  . ezetimibe (ZETIA) 10 MG tablet Take 1 tablet (10 mg total) by mouth daily.  90 tablet  3  . furosemide (LASIX) 20 MG tablet Take 1 tablet (20 mg total) by mouth daily. swelling  90 tablet  3  . HYDROcodone-acetaminophen (NORCO) 5-325 MG per tablet Take 1 tablet by mouth every 6 (six) hours as needed.  30 tablet  0  . isosorbide mononitrate (IMDUR) 30 MG 24 hr tablet Take 1 tablet (30 mg total) by mouth daily.  90 tablet  3  . lisinopril (PRINIVIL,ZESTRIL) 20 MG tablet Take 1 tablet (20 mg total) by mouth daily.  90 tablet  3  . meloxicam (MOBIC) 15 MG tablet Take 15 mg by mouth daily.      . metoprolol tartrate (LOPRESSOR) 25 MG tablet Take 1 tablet (25 mg total) by mouth 2 (two) times daily.  180 tablet  3  . Multiple Vitamin (MULTIVITAMIN) tablet Take 1 tablet by mouth daily.      . nitroGLYCERIN (NITROSTAT) 0.4 MG SL tablet Place 0.4 mg under the tongue every 5 (five) minutes as needed.        . nitroGLYCERIN (NITROSTAT) 0.4 MG SL tablet Place 1 tablet (0.4 mg total) under the tongue every 5 (five) minutes as needed for chest pain.  25 tablet  6  . omeprazole (PRILOSEC) 40 MG capsule Take 40 mg by mouth daily.      [name]  potassium chloride SA (K-DUR,KLOR-CON) 20 MEQ tablet Take 1 tablet daily while taking lasix  90 tablet  3  . rosuvastatin (CRESTOR) 40 MG tablet Take 1 tablet (40 mg total) by mouth daily.  90 tablet  3    Review of Systems  Constitutional: Negative.   Eyes: Negative.   Respiratory: Negative.   Cardiovascular: Positive for chest pain.  Gastrointestinal: Negative.   Musculoskeletal: Positive for myalgias, back pain, joint swelling and arthralgias.  Skin: Negative.   Neurological: Positive for headaches.  Hematological: Negative.   Psychiatric/Behavioral: Negative.   All other systems reviewed and are negative.    BP 120/82  Pulse 63  Ht 5\' 6"  (1.676 m)   Wt 192 lb (87.091 kg)  BMI 30.99 kg/m2  Physical Exam  Nursing note and vitals reviewed. Constitutional: She is oriented to person, place, and time. She appears well-developed and well-nourished.  HENT:  Head: Normocephalic.  Nose: Nose normal.  Mouth/Throat: Oropharynx is clear and moist.  Eyes: Conjunctivae normal are normal. Pupils are equal, round, and reactive to light.  Neck: Normal range of motion. Neck supple. No JVD present.  Cardiovascular: Normal rate, regular rhythm, S1 normal, S2 normal, normal heart sounds and intact distal pulses.  Exam reveals no gallop and no friction rub.   No murmur heard. Pulmonary/Chest: Effort normal and breath sounds normal. No respiratory distress. She has no wheezes. She has no rales. She exhibits no tenderness.  Abdominal: Soft. Bowel sounds are normal. She exhibits no distension. There is no tenderness.  Musculoskeletal: Normal range of motion. She exhibits no edema and no tenderness.  Lymphadenopathy:    She has no cervical adenopathy.  Neurological: She is alert and oriented to person, place, and time. Coordination normal.  Skin: Skin is warm and dry. No rash noted. No erythema.  Psychiatric: She has a normal mood and affect. Her behavior is normal. Judgment and thought content normal.         Assessment and Plan

## 2012-05-05 NOTE — Assessment & Plan Note (Signed)
Continue aggressive cholesterol management. LDL less than 70.   

## 2012-05-05 NOTE — Assessment & Plan Note (Signed)
Chest pain is relatively unchanged. She has chronic symptoms. We have offered ranexa 500 mg twice a day increasing to 1000 mg twice a day for symptoms. We have suggested she take metoprolol tartrate twice a day instead of once a day. We have also suggested she could try to increase her isosorbide by half dose daily intake sublingual nitroglycerin for severe symptoms. If symptoms escalate we have suggested she call our office for further evaluation. She reports they are relatively unchanged and rare, possibly once a week.

## 2012-06-01 ENCOUNTER — Ambulatory Visit: Payer: Self-pay | Admitting: Pain Medicine

## 2012-07-07 ENCOUNTER — Other Ambulatory Visit: Payer: Self-pay | Admitting: Neurosurgery

## 2012-07-07 DIAGNOSIS — M5030 Other cervical disc degeneration, unspecified cervical region: Secondary | ICD-10-CM

## 2012-07-07 DIAGNOSIS — M47812 Spondylosis without myelopathy or radiculopathy, cervical region: Secondary | ICD-10-CM

## 2012-07-07 DIAGNOSIS — M542 Cervicalgia: Secondary | ICD-10-CM

## 2012-07-07 DIAGNOSIS — M5412 Radiculopathy, cervical region: Secondary | ICD-10-CM

## 2012-07-15 ENCOUNTER — Ambulatory Visit
Admission: RE | Admit: 2012-07-15 | Discharge: 2012-07-15 | Disposition: A | Discharge status: Home / Self Care | Payer: Medicaid Other | Source: Ambulatory Visit | Attending: Neurosurgery | Admitting: Neurosurgery

## 2012-07-15 ENCOUNTER — Other Ambulatory Visit: Payer: Medicaid Other

## 2012-07-15 DIAGNOSIS — M47812 Spondylosis without myelopathy or radiculopathy, cervical region: Secondary | ICD-10-CM

## 2012-07-15 DIAGNOSIS — M542 Cervicalgia: Secondary | ICD-10-CM

## 2012-07-15 DIAGNOSIS — M5030 Other cervical disc degeneration, unspecified cervical region: Secondary | ICD-10-CM

## 2012-07-15 DIAGNOSIS — M5412 Radiculopathy, cervical region: Secondary | ICD-10-CM

## 2012-08-04 ENCOUNTER — Ambulatory Visit: Payer: Self-pay | Admitting: Gastroenterology

## 2012-08-20 ENCOUNTER — Telehealth: Payer: Self-pay | Admitting: Physician Assistant

## 2012-08-20 NOTE — Telephone Encounter (Signed)
Patient called the answering service re: chest pain and request for a medication for her nerves. She recently received unfortunate news regarding her niece. Since hearing of this, she reports experiencing squeezing chest pain rated at a 5/10 and shortness of breath. She states "this is not a heart attack" and attributes this largely to grief and anxiety. The description of her pain is vague and difficult to obtain beyond that. She reports a sensation of being squeezed or suffocated, and similar to being outside in the cold. She has not tried NTG to alleviate her symptoms. She continues to take her outpatient meds as prescribed. She tells me she was unable to tolerate Ranexa, and has stopped this. She is also requesting a "nerve pill" during this difficult time. Advised to take NTG SL PRN as prescribed waiting 5 minute intervals and monitoring her symptoms. If after three attempts, they do not improve, advised to call 911 or be driven to the nearest ED. She has a follow-up appointment with Dr. [name] on 09/01/12 at which time further recommendations can be made. Informed her that her PCP would be an appropriate resource to contact for the request of medications for anxiety/depression. She understood and agreed.    Jacqulyn Bath, PA-C 08/20/2012 9:43 PM

## 2012-08-23 NOTE — Telephone Encounter (Signed)
Would make sure she has nitroglycerin sublingual if needed Would definitely request medication for nerves from PCP

## 2012-08-24 NOTE — Telephone Encounter (Signed)
Pt says she had PCP call in anxiety RX and this has helped as well She will call us if symptoms return/are not controlled with NTG and anti-anxiety meds

## 2012-08-31 ENCOUNTER — Ambulatory Visit: Payer: Medicaid Other | Admitting: Cardiovascular Disease

## 2012-09-01 ENCOUNTER — Ambulatory Visit (INDEPENDENT_AMBULATORY_CARE_PROVIDER_SITE_OTHER): Payer: Medicaid Other | Admitting: Cardiovascular Disease

## 2012-09-01 ENCOUNTER — Encounter: Payer: Self-pay | Admitting: Cardiovascular Disease

## 2012-09-01 VITALS — BP 120/72 | HR 65 | Ht 66.0 in | Wt 200.2 lb

## 2012-09-01 DIAGNOSIS — I2510 Atherosclerotic heart disease of native coronary artery without angina pectoris: Secondary | ICD-10-CM

## 2012-09-01 DIAGNOSIS — R079 Chest pain, unspecified: Secondary | ICD-10-CM

## 2012-09-01 DIAGNOSIS — R0602 Shortness of breath: Secondary | ICD-10-CM

## 2012-09-01 DIAGNOSIS — E785 Hyperlipidemia, unspecified: Secondary | ICD-10-CM

## 2012-09-01 DIAGNOSIS — I10 Essential (primary) hypertension: Secondary | ICD-10-CM

## 2012-09-01 MED ORDER — FUROSEMIDE 20 MG PO TABS: 20.0000 mg | ORAL_TABLET | Freq: Every day | ORAL | Status: DC

## 2012-09-01 MED ORDER — METOPROLOL TARTRATE 25 MG PO TABS: 25.0000 mg | ORAL_TABLET | Freq: Two times a day (BID) | ORAL | Status: DC

## 2012-09-01 MED ORDER — ISOSORBIDE MONONITRATE ER 30 MG PO TB24: 30.0000 mg | ORAL_TABLET | Freq: Two times a day (BID) | ORAL | Status: DC | PRN

## 2012-09-01 NOTE — Assessment & Plan Note (Signed)
Blood pressure is well controlled on today's visit. No changes made to the medications. 

## 2012-09-01 NOTE — Progress Notes (Signed)
Patient ID: [name], female    DOB: Dec 24, 1960, 52 y.o.   MRN: [id number]  HPI Comments: 52 yo woman  with a long smoking history,  h/o CAD s/p  NSTEMI at [facility]  1/12, with BMS to pLAD (80% stenosis), residual severe stenosis of the RCA which is a nondominant small vessel, With continued chest pain, repeat cardiac catheterization showing distal left main stenosis involving a high grade ostial LAD stenosis and circumflex disease approximately 90%, 90% diagonal disease, 80% nondominant RCA disease with ejection fraction 45%, who underwent bypass surgery x4 with a LIMA to the LAD, vein graft to the diagonal, vein graft to the marginal and vein graft to the RCA,  Also with peripheral vascular disease, S/p atherectomy and PTA of her right SFA on 01/15/11,  who presents for routine followup.   Prior  outpatient stress test  July 2013 showed significant breast attenuation artifact, inferior wall artifact from GI uptake. Overall no significant ischemia.  She has had chronic left arm pain and sees the pain clinic. Prior diagnosis of carpal tunnel syndrome  She does have occasional left breast pain radiating to her mediastinum.  She's not taking nitroglycerin as symptoms are not severe. Symptoms have been chronic and not progressing.  She is seeing ear nose throat for vestibular testing as a cause of her dizziness. Recent EGD and colonoscopy which by her report showed hiatal hernia. Prior problems with swallowing Continues to have tenderness and swelling of her legs bilaterally. Recent weight gain, also with stress in the family, also family members She has had MRI of the neck showing cervical disc disease She wear CPAP for obstructive sleep apnea  previous he tried on ranexa for chest pain relief with no improvement of her symptoms. Medication was stopped  Echocardiogram in the hospital showed normal LV systolic function ejection fraction 50-55%, mild MR, mild to moderate TR with right ventricular  systolic pressures 30-40  EKG shows normal sinus rhythm, heart rate 65 beats per minute with PVCs ,  T-wave abnormality in V3 to V6, 2, 3, aVF ( unchanged from previous EKG)  Outpatient Encounter Prescriptions as of 09/01/2012  Medication Sig Dispense Refill  . aspirin 162 MG EC tablet Take 162 mg by mouth daily.      . clopidogrel (PLAVIX) 75 MG tablet Take 1 tablet (75 mg total) by mouth daily.  90 tablet  3  . ezetimibe (ZETIA) 10 MG tablet Take 1 tablet (10 mg total) by mouth daily.  90 tablet  3  . furosemide (LASIX) 20 MG tablet Take 1 tablet (20 mg total) by mouth daily. swelling  90 tablet  3  . hydrOXYzine (VISTARIL) 25 MG capsule Take 25 mg by mouth as needed.      . isosorbide mononitrate (IMDUR) 30 MG 24 hr tablet Take 1 tablet (30 mg total) by mouth 2 (two) times daily as needed.  180 tablet  3  . lisinopril (PRINIVIL,ZESTRIL) 20 MG tablet Take 1 tablet (20 mg total) by mouth daily.  90 tablet  3  . meloxicam (MOBIC) 15 MG tablet Take 15 mg by mouth daily.      . metoprolol tartrate (LOPRESSOR) 25 MG tablet Take 1 tablet (25 mg total) by mouth 2 (two) times daily.  180 tablet  3  . Multiple Vitamin (MULTIVITAMIN) tablet Take 1 tablet by mouth daily.      . nitroGLYCERIN (NITROSTAT) 0.4 MG SL tablet Place 0.4 mg under the tongue every 5 (five) minutes as needed.        [name]  nitroGLYCERIN (NITROSTAT) 0.4 MG SL tablet Place 1 tablet (0.4 mg total) under the tongue every 5 (five) minutes as needed for chest pain.  25 tablet  6  . NON FORMULARY CPAP daily at bedtime.      [name] omeprazole (PRILOSEC) 40 MG capsule Take 40 mg by mouth daily.      . potassium chloride SA (K-DUR,KLOR-CON) 20 MEQ tablet Take 1 tablet daily while taking lasix  90 tablet  3  . rosuvastatin (CRESTOR) 40 MG tablet Take 1 tablet (40 mg total) by mouth daily.  90 tablet  3    Review of Systems  Constitutional: Negative.   Eyes: Negative.   Respiratory: Negative.   Cardiovascular: Positive for chest pain.   Gastrointestinal: Negative.   Musculoskeletal: Positive for myalgias, back pain, joint swelling and arthralgias.  Skin: Negative.   Neurological: Positive for headaches.  Hematological: Negative.   Psychiatric/Behavioral: Negative.   All other systems reviewed and are negative.    BP 120/72  Pulse 65  Ht 5\' 6"  (1.676 m)  Wt 200 lb 4 oz (90.833 kg)  BMI 32.32 kg/m2  Physical Exam  Nursing note and vitals reviewed. Constitutional: She is oriented to person, place, and time. She appears well-developed and well-nourished.  HENT:  Head: Normocephalic.  Nose: Nose normal.  Mouth/Throat: Oropharynx is clear and moist.  Eyes: Conjunctivae normal are normal. Pupils are equal, round, and reactive to light.  Neck: Normal range of motion. Neck supple. No JVD present.  Cardiovascular: Normal rate, regular rhythm, S1 normal, S2 normal, normal heart sounds and intact distal pulses.  Exam reveals no gallop and no friction rub.   No murmur heard. Pulmonary/Chest: Effort normal and breath sounds normal. No respiratory distress. She has no wheezes. She has no rales. She exhibits no tenderness.  Abdominal: Soft. Bowel sounds are normal. She exhibits no distension. There is no tenderness.  Musculoskeletal: Normal range of motion. She exhibits no edema and no tenderness.  Lymphadenopathy:    She has no cervical adenopathy.  Neurological: She is alert and oriented to person, place, and time. Coordination normal.  Skin: Skin is warm and dry. No rash noted. No erythema.  Psychiatric: She has a normal mood and affect. Her behavior is normal. Judgment and thought content normal.         Assessment and Plan

## 2012-09-01 NOTE — Assessment & Plan Note (Signed)
No changes to the medications were made. Goal LDL less than 70 

## 2012-09-01 NOTE — Assessment & Plan Note (Signed)
Diffuse coronary artery disease, history of bypass. Chronic chest pain. Likely small vessel disease given recent negative stress test. We'll continue to advance long-acting nitrates. We have suggested she take isosorbide twice a day for worsening chest pain, otherwise take daily.

## 2012-09-01 NOTE — Patient Instructions (Addendum)
You are doing well. Please take isosorbide for chest pain, up to twice a day  Please call us if you have new issues that need to be addressed before your next appt.  Your physician wants you to follow-up in: 6 months.  You will receive a reminder letter in the mail two months in advance. If you don't receive a letter, please call our office to schedule the follow-up appointment.

## 2012-11-29 ENCOUNTER — Telehealth: Payer: Self-pay | Admitting: *Deleted

## 2012-11-29 ENCOUNTER — Other Ambulatory Visit: Payer: Self-pay

## 2012-11-29 MED ORDER — ISOSORBIDE MONONITRATE ER 30 MG PO TB24: 30.0000 mg | ORAL_TABLET | Freq: Two times a day (BID) | ORAL | Status: DC

## 2012-11-29 NOTE — Telephone Encounter (Signed)
Pt calling c/o left arm and hand pain. No other symptoms. No a constant pain

## 2012-11-29 NOTE — Telephone Encounter (Signed)
I discussed pt's symptoms with Dr. [name]. He advised to "have pt try NTG SL PRN chest/arm pain to see if this helps.  She should also increase isosorbide to BID for several days to see if this helps symptoms as well." V.O Dr. Alvis Lemmings, RN

## 2012-11-29 NOTE — Telephone Encounter (Signed)
Pt informed Understanding verb 

## 2012-11-29 NOTE — Telephone Encounter (Signed)
Pt c/o left arm pain, "different than my usual pain", radiating to left chest, denies sob. Says she has a tendon that needs "repair" and not sure if this is related She has not taken any NTG Taking isosorbide once daily I will discuss with Dr. [name] and call her back Understanding verb

## 2012-12-31 ENCOUNTER — Ambulatory Visit: Payer: Medicaid Other | Admitting: Cardiovascular Disease

## 2013-01-03 ENCOUNTER — Ambulatory Visit (INDEPENDENT_AMBULATORY_CARE_PROVIDER_SITE_OTHER): Payer: Medicaid Other | Admitting: Cardiovascular Disease

## 2013-01-03 ENCOUNTER — Encounter: Payer: Self-pay | Admitting: Cardiovascular Disease

## 2013-01-03 VITALS — BP 115/83 | HR 59 | Ht 65.5 in | Wt 209.0 lb

## 2013-01-03 DIAGNOSIS — J449 Chronic obstructive pulmonary disease, unspecified: Secondary | ICD-10-CM | POA: Insufficient documentation

## 2013-01-03 DIAGNOSIS — R0602 Shortness of breath: Secondary | ICD-10-CM

## 2013-01-03 DIAGNOSIS — I2510 Atherosclerotic heart disease of native coronary artery without angina pectoris: Secondary | ICD-10-CM

## 2013-01-03 DIAGNOSIS — E785 Hyperlipidemia, unspecified: Secondary | ICD-10-CM

## 2013-01-03 DIAGNOSIS — J40 Bronchitis, not specified as acute or chronic: Secondary | ICD-10-CM

## 2013-01-03 DIAGNOSIS — I5022 Chronic systolic (congestive) heart failure: Secondary | ICD-10-CM

## 2013-01-03 DIAGNOSIS — R609 Edema, unspecified: Secondary | ICD-10-CM

## 2013-01-03 DIAGNOSIS — I509 Heart failure, unspecified: Secondary | ICD-10-CM

## 2013-01-03 DIAGNOSIS — M79602 Pain in left arm: Secondary | ICD-10-CM

## 2013-01-03 DIAGNOSIS — I10 Essential (primary) hypertension: Secondary | ICD-10-CM

## 2013-01-03 DIAGNOSIS — I25810 Atherosclerosis of coronary artery bypass graft(s) without angina pectoris: Secondary | ICD-10-CM

## 2013-01-03 DIAGNOSIS — M79609 Pain in unspecified limb: Secondary | ICD-10-CM

## 2013-01-03 MED ORDER — AZITHROMYCIN 250 MG PO TABS: 250.0000 mg | ORAL_TABLET | Freq: Every day | ORAL | Status: DC

## 2013-01-03 NOTE — Assessment & Plan Note (Signed)
Cholesterol is at goal on the current lipid regimen. No changes to the medications were made.  

## 2013-01-03 NOTE — Assessment & Plan Note (Signed)
Atypical type chest pains. No further workup at this time. Continue current medication regimen.

## 2013-01-03 NOTE — Assessment & Plan Note (Signed)
Doing well on her Lasix. We have suggested she decrease her dose back to her baseline 40 mg daily as edema has resolved. Take extra Lasix as needed. We'll check a basic metabolic panel today

## 2013-01-03 NOTE — Progress Notes (Signed)
Patient ID: [name], female    DOB: 1960-09-11, 52 y.o.   MRN: [id number]  HPI Comments: 52 yo woman  with a long smoking history,  h/o CAD s/p  NSTEMI at [facility]  1/12, with BMS to pLAD (80% stenosis), residual severe stenosis of the RCA which is a nondominant small vessel, With continued chest pain, repeat cardiac catheterization showing distal left main stenosis involving a high grade ostial LAD stenosis and circumflex disease approximately 90%, 90% diagonal disease, 80% nondominant RCA disease with ejection fraction 45%, who underwent bypass surgery x4 with a LIMA to the LAD, vein graft to the diagonal, vein graft to the marginal and vein graft to the RCA,  Also with peripheral vascular disease, S/p atherectomy and PTA of her right SFA on 01/15/11,  who presents for routine followup.   Prior  outpatient stress test  July 2013 showed significant breast attenuation artifact, inferior wall artifact from GI uptake. Overall no significant ischemia.  She has had chronic left arm pain and sees the pain clinic. Prior diagnosis of carpal tunnel syndrome  She does have occasional left breast pain radiating to her mediastinum.    Previously seen by ear nose throat for vestibular testing as a cause of her dizziness.  EGD and colonoscopy which by her report showed hiatal hernia. Prior problems with swallowing  tenderness and swelling of her legs bilaterally in the past She has had MRI of the neck showing cervical disc disease She wear CPAP for obstructive sleep apnea  previous he tried on ranexa for chest pain relief with no improvement of her symptoms. Medication was stopped  She reports significant nasal, nose, chest congestion, cough for the past several weeks, coughing up yellow thick secretions. Occasional cramping in her chest muscles, chest pain twinges but not typically with exertion. Continues to have edema and she has been taking extra Lasix without potassium. Lab work shows total cholesterol  152, LDL 80, HDL 56   Echocardiogram in the hospital showed normal LV systolic function ejection fraction 50-55%, mild MR, mild to moderate TR with right ventricular systolic pressures 30-40  EKG shows normal sinus rhythm, heart rate 59 beats per minute with PVCs ,  T-wave abnormality in V1 to V5  Outpatient Encounter Prescriptions as of 01/03/2013  Medication Sig Dispense Refill  . aspirin 162 MG EC tablet Take 162 mg by mouth daily.      [name] BLACK COHOSH PO Take by mouth daily.      . cetirizine (ZYRTEC) 10 MG chewable tablet Chew 10 mg by mouth daily.      . clopidogrel (PLAVIX) 75 MG tablet Take 1 tablet (75 mg total) by mouth daily.  90 tablet  3  . ezetimibe (ZETIA) 10 MG tablet Take 1 tablet (10 mg total) by mouth daily.  90 tablet  3  . furosemide (LASIX) 20 MG tablet Take 60 mg by mouth daily. swelling      . HYDROcodone-acetaminophen (NORCO/VICODIN) 5-325 MG per tablet Take 1 tablet by mouth as needed for pain.      . hydrOXYzine (VISTARIL) 25 MG capsule Take 25 mg by mouth as needed.      . isosorbide mononitrate (IMDUR) 30 MG 24 hr tablet Take 1 tablet (30 mg total) by mouth 2 (two) times daily.  180 tablet  3  . lisinopril (PRINIVIL,ZESTRIL) 20 MG tablet Take 1 tablet (20 mg total) by mouth daily.  90 tablet  3  . meloxicam (MOBIC) 15 MG tablet Take 15 mg  by mouth daily.      . metoprolol tartrate (LOPRESSOR) 25 MG tablet Take 1 tablet (25 mg total) by mouth 2 (two) times daily.  180 tablet  3  . Multiple Vitamin (MULTIVITAMIN) tablet Take 1 tablet by mouth daily.      . nitroGLYCERIN (NITROSTAT) 0.4 MG SL tablet Place 0.4 mg under the tongue every 5 (five) minutes as needed.        . NON FORMULARY CPAP daily at bedtime.      [name] omeprazole (PRILOSEC) 40 MG capsule Take 40 mg by mouth daily.      . rosuvastatin (CRESTOR) 40 MG tablet Take 1 tablet (40 mg total) by mouth daily.  90 tablet  3  . sertraline (ZOLOFT) 50 MG tablet Take 50 mg by mouth daily.       Review of Systems   Constitutional: Negative.   Eyes: Negative.   Respiratory: Negative.   Cardiovascular: Positive for chest pain.  Gastrointestinal: Negative.   Musculoskeletal: Positive for myalgias, back pain, joint swelling and arthralgias.  Skin: Negative.   Psychiatric/Behavioral: Negative.   All other systems reviewed and are negative.    BP 115/83  Pulse 59  Ht 5' 5.5" (1.664 m)  Wt 209 lb (94.802 kg)  BMI 34.24 kg/m2  Physical Exam  Nursing note and vitals reviewed. Constitutional: She is oriented to person, place, and time. She appears well-developed and well-nourished.  HENT:  Head: Normocephalic.  Nose: Nose normal.  Mouth/Throat: Oropharynx is clear and moist.  Eyes: Conjunctivae are normal. Pupils are equal, round, and reactive to light.  Neck: Normal range of motion. Neck supple. No JVD present.  Cardiovascular: Normal rate, regular rhythm, S1 normal, S2 normal, normal heart sounds and intact distal pulses.  Exam reveals no gallop and no friction rub.   No murmur heard. Pulmonary/Chest: Effort normal and breath sounds normal. No respiratory distress. She has no wheezes. She has no rales. She exhibits no tenderness.  Abdominal: Soft. Bowel sounds are normal. She exhibits no distension. There is no tenderness.  Musculoskeletal: Normal range of motion. She exhibits no edema and no tenderness.  Lymphadenopathy:    She has no cervical adenopathy.  Neurological: She is alert and oriented to person, place, and time. Coordination normal.  Skin: Skin is warm and dry. No rash noted. No erythema.  Psychiatric: She has a normal mood and affect. Her behavior is normal. Judgment and thought content normal.    Assessment and Plan

## 2013-01-03 NOTE — Assessment & Plan Note (Signed)
We will prescribe her a Z-Pak today

## 2013-01-03 NOTE — Patient Instructions (Addendum)
You are doing well.  Please take Zpak We will check BMP today  Please call us if you have new issues that need to be addressed before your next appt.  Your physician wants you to follow-up in: 6 months.  You will receive a reminder letter in the mail two months in advance. If you don't receive a letter, please call our office to schedule the follow-up appointment.

## 2013-01-03 NOTE — Assessment & Plan Note (Signed)
Blood pressure is well controlled on today's visit. No changes made to the medications. 

## 2013-01-04 LAB — BASIC METABOLIC PANEL
BUN/Creatinine Ratio: 16 (ref 9–23)
Calcium: 9.9 mg/dL (ref 8.7–10.2)
GFR calc non Af Amer: 88 mL/min/{1.73_m2} (ref >59)
Potassium: 4.1 mmol/L (ref 3.5–5.2)
Sodium: 145 mmol/L — ABNORMAL HIGH (ref 134–144)

## 2013-02-01 ENCOUNTER — Telehealth: Payer: Self-pay

## 2013-02-01 NOTE — Telephone Encounter (Signed)
Spoke with [name] with [employer] for prior authorization for crestor 40 mg; PIN # [id number] did get approved per the pharmacist. The phone number called for prior authorization is [phone].  Notified patient the crestor did get approved.

## 2013-02-23 ENCOUNTER — Encounter: Payer: Self-pay | Admitting: Cardiovascular Disease

## 2013-02-23 ENCOUNTER — Other Ambulatory Visit (INDEPENDENT_AMBULATORY_CARE_PROVIDER_SITE_OTHER): Payer: Medicaid Other

## 2013-02-23 DIAGNOSIS — M79609 Pain in unspecified limb: Secondary | ICD-10-CM

## 2013-02-23 DIAGNOSIS — I739 Peripheral vascular disease, unspecified: Secondary | ICD-10-CM

## 2013-04-26 ENCOUNTER — Ambulatory Visit: Payer: Self-pay | Admitting: Internal Medicine

## 2013-05-09 ENCOUNTER — Other Ambulatory Visit: Payer: Self-pay | Admitting: Cardiovascular Disease

## 2013-05-11 ENCOUNTER — Other Ambulatory Visit: Payer: Self-pay | Admitting: Cardiovascular Disease

## 2013-05-12 ENCOUNTER — Other Ambulatory Visit: Payer: Self-pay | Admitting: *Deleted

## 2013-05-17 ENCOUNTER — Other Ambulatory Visit: Payer: Self-pay | Admitting: *Deleted

## 2013-05-17 ENCOUNTER — Other Ambulatory Visit: Payer: Self-pay | Admitting: Cardiovascular Disease

## 2013-05-17 MED ORDER — LISINOPRIL 20 MG PO TABS: 20.0000 mg | ORAL_TABLET | Freq: Every day | ORAL | Status: DC

## 2013-05-17 MED ORDER — FUROSEMIDE 20 MG PO TABS: ORAL_TABLET | ORAL | Status: DC

## 2013-05-17 MED ORDER — FUROSEMIDE 20 MG PO TABS: 60.0000 mg | ORAL_TABLET | Freq: Every day | ORAL | Status: DC

## 2013-05-17 NOTE — Telephone Encounter (Signed)
Requested Prescriptions   Signed Prescriptions Disp Refills  . furosemide (LASIX) 20 MG tablet 180 tablet 3    Sig: TAKE ONE TABLET BY MOUTH EVERY DAY FOR SWELLING. MAY TAKE 1 ADDITIONAL TABLET AS NEEDED FOR SWELLING/FLUID.    Authorizing Provider: [name], TIMOTHY J    Ordering User: Brittane Grudzinski C    

## 2013-05-17 NOTE — Telephone Encounter (Signed)
Requested Prescriptions   Signed Prescriptions Disp Refills  . furosemide (LASIX) 20 MG tablet 90 tablet 3    Sig: Take 3 tablets (60 mg total) by mouth daily. swelling    Authorizing Provider: Antonieta Iba    Ordering User: Shawnie Dapper, Colandra Ohanian C  . lisinopril (PRINIVIL,ZESTRIL) 20 MG tablet 90 tablet 3    Sig: Take 1 tablet (20 mg total) by mouth daily.    Authorizing Provider: Antonieta Iba    Ordering User: Kendrick Fries

## 2013-06-02 ENCOUNTER — Other Ambulatory Visit: Payer: Self-pay

## 2013-06-03 ENCOUNTER — Ambulatory Visit (INDEPENDENT_AMBULATORY_CARE_PROVIDER_SITE_OTHER): Payer: Medicaid Other | Admitting: Cardiovascular Disease

## 2013-06-03 ENCOUNTER — Encounter: Payer: Self-pay | Admitting: Cardiovascular Disease

## 2013-06-03 VITALS — BP 120/82 | HR 64 | Ht 66.0 in | Wt 206.2 lb

## 2013-06-03 DIAGNOSIS — R197 Diarrhea, unspecified: Secondary | ICD-10-CM

## 2013-06-03 DIAGNOSIS — I2510 Atherosclerotic heart disease of native coronary artery without angina pectoris: Secondary | ICD-10-CM

## 2013-06-03 DIAGNOSIS — I10 Essential (primary) hypertension: Secondary | ICD-10-CM

## 2013-06-03 DIAGNOSIS — E785 Hyperlipidemia, unspecified: Secondary | ICD-10-CM

## 2013-06-03 DIAGNOSIS — Z951 Presence of aortocoronary bypass graft: Secondary | ICD-10-CM

## 2013-06-03 NOTE — Assessment & Plan Note (Signed)
Cholesterol is at goal on the current lipid regimen. No changes to the medications were made.  

## 2013-06-03 NOTE — Patient Instructions (Signed)
You are doing well. No medication changes were made.  Please call us if you have new issues that need to be addressed before your next appt.  Your physician wants you to follow-up in: 6 months.  You will receive a reminder letter in the mail two months in advance. If you don't receive a letter, please call our office to schedule the follow-up appointment.   

## 2013-06-03 NOTE — Assessment & Plan Note (Signed)
Blood pressure is well controlled on today's visit. No changes made to the medications. 

## 2013-06-03 NOTE — Assessment & Plan Note (Signed)
Currently with no symptoms of angina. No further workup at this time. Continue current medication regimen. 

## 2013-06-03 NOTE — Progress Notes (Signed)
Patient ID: [name], female    DOB: [birth year], 52 y.o.   MRN: [id number]  HPI Comments: 52 yo woman  with a long smoking history,  h/o CAD s/p  NSTEMI at [facility]  1/12, with BMS to pLAD (80% stenosis), residual severe stenosis of the RCA which is a nondominant small vessel, With continued chest pain, repeat cardiac catheterization showing distal left main stenosis involving a high grade ostial LAD stenosis and circumflex disease approximately 90%, 90% diagonal disease, 80% nondominant RCA disease with ejection fraction 45%, who underwent bypass surgery x4 with a LIMA to the LAD, vein graft to the diagonal, vein graft to the marginal and vein graft to the RCA,  Also with peripheral vascular disease, S/p atherectomy and PTA of her right SFA on 01/15/11,  who presents for routine followup.   Prior  outpatient stress test  July 2013 showed significant breast attenuation artifact, inferior wall artifact from GI uptake. Overall no significant ischemia. History of chronic left arm pain and was seen pain clinic. Prior diagnosis of carpal tunnel syndrome  occasional left breast pain radiating to her mediastinum.    Previously seen by ear nose throat for vestibular testing as a cause of her dizziness.  EGD and colonoscopy which by her report showed hiatal hernia. Prior problems with swallowing  tenderness and swelling of her legs bilaterally in the past She has had MRI of the neck showing cervical disc disease She wear CPAP for obstructive sleep apnea  previous tried on ranexa for chest pain relief with no improvement of her symptoms. Medication was stopped  In followup today, she reports having loose bowel movements and finding her pills in the toilet. Etiology of her loose stools is not clear Also she reports having recurrent pain in her left breast on the medial aspect near her mediastinum for the past several weeks. It is tender to palpation, feels better if she moves the breast one way or the  other.  Lab work shows total cholesterol 152, LDL 80, HDL 56  Echocardiogram in the hospital showed normal LV systolic function ejection fraction 50-55%, mild MR, mild to moderate TR with right ventricular systolic pressures 30-40  EKG shows normal sinus rhythm, heart rate 64 beats per minute with PVCs ,  T-wave abnormality in V1 to V5  Outpatient Encounter Prescriptions as of 06/03/2013  Medication Sig  . aspirin 162 MG EC tablet Take 162 mg by mouth daily.  . cetirizine (ZYRTEC) 10 MG chewable tablet Chew 10 mg by mouth daily.  . clopidogrel (PLAVIX) 75 MG tablet TAKE ONE TABLET BY MOUTH EVERY DAY  . CRESTOR 40 MG tablet TAKE ONE TABLET BY MOUTH EVERY DAY  . furosemide (LASIX) 20 MG tablet TAKE ONE TABLET BY MOUTH EVERY DAY FOR SWELLING. MAY TAKE 1 ADDITIONAL TABLET AS NEEDED FOR SWELLING/FLUID.  [name] isosorbide mononitrate (IMDUR) 30 MG 24 hr tablet Take 1 tablet (30 mg total) by mouth 2 (two) times daily.  [name] lisinopril (PRINIVIL,ZESTRIL) 20 MG tablet TAKE ONE TABLET BY MOUTH EVERY DAY  . lisinopril (PRINIVIL,ZESTRIL) 20 MG tablet Take 1 tablet (20 mg total) by mouth daily.  . meloxicam (MOBIC) 15 MG tablet Take 15 mg by mouth daily.  . metoprolol tartrate (LOPRESSOR) 25 MG tablet Take 1 tablet (25 mg total) by mouth 2 (two) times daily.  . nitroGLYCERIN (NITROSTAT) 0.4 MG SL tablet Place 0.4 mg under the tongue every 5 (five) minutes as needed.    . NON FORMULARY CPAP daily at bedtime.  [name]  omeprazole (PRILOSEC) 40 MG capsule Take 40 mg by mouth daily.    Review of Systems  Constitutional: Negative.   Eyes: Negative.   Respiratory: Negative.   Cardiovascular: Positive for chest pain.  Gastrointestinal: Positive for diarrhea.  Musculoskeletal: Positive for arthralgias, back pain, joint swelling and myalgias.  Skin: Negative.   Psychiatric/Behavioral: Negative.   All other systems reviewed and are negative.    BP 120/82  Pulse 64  Ht 5\' 6"  (1.676 m)  Wt 206 lb 4 oz (93.554 kg)   BMI 33.31 kg/m2  Physical Exam  Nursing note and vitals reviewed. Constitutional: She is oriented to person, place, and time. She appears well-developed and well-nourished.  HENT:  Head: Normocephalic.  Nose: Nose normal.  Mouth/Throat: Oropharynx is clear and moist.  Eyes: Conjunctivae are normal. Pupils are equal, round, and reactive to light.  Neck: Normal range of motion. Neck supple. No JVD present.  Cardiovascular: Normal rate, regular rhythm, S1 normal, S2 normal, normal heart sounds and intact distal pulses.  Exam reveals no gallop and no friction rub.   No murmur heard. Pulmonary/Chest: Effort normal and breath sounds normal. No respiratory distress. She has no wheezes. She has no rales. She exhibits no tenderness.  Abdominal: Soft. Bowel sounds are normal. She exhibits no distension. There is no tenderness.  Musculoskeletal: Normal range of motion. She exhibits no edema and no tenderness.  Lymphadenopathy:    She has no cervical adenopathy.  Neurological: She is alert and oriented to person, place, and time. Coordination normal.  Skin: Skin is warm and dry. No rash noted. No erythema.  Psychiatric: She has a normal mood and affect. Her behavior is normal. Judgment and thought content normal.    Assessment and Plan

## 2013-06-03 NOTE — Assessment & Plan Note (Signed)
She reports having several weeks of loose bowel movements. She has followup with her primary care physician. We have recommended she use Imodium. She has already completed rounds of antibiotics

## 2013-06-06 ENCOUNTER — Encounter: Payer: Self-pay | Admitting: *Deleted

## 2013-06-27 ENCOUNTER — Ambulatory Visit: Payer: Self-pay | Admitting: General Surgery

## 2013-07-04 ENCOUNTER — Ambulatory Visit: Payer: Medicaid Other | Admitting: Cardiovascular Disease

## 2013-07-19 ENCOUNTER — Ambulatory Visit: Payer: Self-pay | Admitting: General Surgery

## 2013-07-27 ENCOUNTER — Ambulatory Visit: Payer: Medicaid Other | Admitting: Cardiovascular Disease

## 2013-08-04 ENCOUNTER — Encounter: Payer: Self-pay | Admitting: Cardiovascular Disease

## 2013-08-04 ENCOUNTER — Ambulatory Visit (INDEPENDENT_AMBULATORY_CARE_PROVIDER_SITE_OTHER): Payer: Medicaid Other | Admitting: Cardiovascular Disease

## 2013-08-04 VITALS — BP 120/86 | HR 62 | Ht 65.0 in | Wt 206.2 lb

## 2013-08-04 DIAGNOSIS — I10 Essential (primary) hypertension: Secondary | ICD-10-CM

## 2013-08-04 DIAGNOSIS — I509 Heart failure, unspecified: Secondary | ICD-10-CM

## 2013-08-04 DIAGNOSIS — R079 Chest pain, unspecified: Secondary | ICD-10-CM

## 2013-08-04 DIAGNOSIS — I5022 Chronic systolic (congestive) heart failure: Secondary | ICD-10-CM

## 2013-08-04 DIAGNOSIS — Z951 Presence of aortocoronary bypass graft: Secondary | ICD-10-CM

## 2013-08-04 DIAGNOSIS — I2510 Atherosclerotic heart disease of native coronary artery without angina pectoris: Secondary | ICD-10-CM

## 2013-08-04 DIAGNOSIS — R0602 Shortness of breath: Secondary | ICD-10-CM

## 2013-08-04 DIAGNOSIS — E785 Hyperlipidemia, unspecified: Secondary | ICD-10-CM

## 2013-08-04 MED ORDER — POTASSIUM CHLORIDE ER 10 MEQ PO TBCR: 10.0000 meq | EXTENDED_RELEASE_TABLET | Freq: Every day | ORAL | Status: DC

## 2013-08-04 MED ORDER — ALBUTEROL SULFATE HFA 108 (90 BASE) MCG/ACT IN AERS: 2.0000 | INHALATION_SPRAY | Freq: Four times a day (QID) | RESPIRATORY_TRACT | Status: DC | PRN

## 2013-08-04 NOTE — Assessment & Plan Note (Signed)
Atypical chest pain. No further workup at this time

## 2013-08-04 NOTE — Assessment & Plan Note (Signed)
Chronic mild shortness of breath particularly with hills or stairs. Long previous smoking history. Suggested she try inhalers when necessary before exertion

## 2013-08-04 NOTE — Assessment & Plan Note (Signed)
Blood pressure is well controlled on today's visit. No changes made to the medications. 

## 2013-08-04 NOTE — Patient Instructions (Signed)
You are doing well. No medication changes were made.  Please call us if you have new issues that need to be addressed before your next appt.  Your physician wants you to follow-up in: 6 months.  You will receive a reminder letter in the mail two months in advance. If you don't receive a letter, please call our office to schedule the follow-up appointment.   

## 2013-08-04 NOTE — Assessment & Plan Note (Signed)
Currently with no symptoms of angina. No further workup at this time. Continue current medication regimen. 

## 2013-08-04 NOTE — Progress Notes (Signed)
Patient ID: [name], female    DOB: [birth year], 53 y.o.   MRN: [id number]  HPI Comments: 53 yo woman  with a long smoking history,  h/o CAD s/p  NSTEMI at [facility]  1/12, with BMS to pLAD (80% stenosis), residual severe stenosis of the RCA which is a nondominant small vessel, With continued chest pain, repeat cardiac catheterization showing distal left main stenosis involving a high grade ostial LAD stenosis and circumflex disease approximately 90%, 90% diagonal disease, 80% nondominant RCA disease with ejection fraction 45%, who underwent bypass surgery x4 with a LIMA to the LAD, vein graft to the diagonal, vein graft to the marginal and vein graft to the RCA,  Also with peripheral vascular disease, S/p atherectomy and PTA of her right SFA on 01/15/11,  who presents for routine followup.   Prior  outpatient stress test  July 2013 showed significant breast attenuation artifact, inferior wall artifact from GI uptake. Overall no significant ischemia. History of chronic left arm and upper left chest  pain and was seen pain clinic. Prior diagnosis of carpal tunnel syndrome  occasional left breast pain radiating to her mediastinum.    Previously seen by ear nose throat for vestibular testing as a cause of her dizziness.  EGD and colonoscopy which by her report showed hiatal hernia. Prior problems with swallowing  tenderness and swelling of her legs bilaterally in the past She has had MRI of the neck showing cervical disc disease She wear CPAP for obstructive sleep apnea  previous tried on ranexa for chest pain relief with no improvement of her symptoms. Medication was stopped  In followup today, she reports that she is doing well overall. She does continue to have occasional sharp fleeting pain in her left upper chest, tender to palpation. She tries [employer] and belching with mild leak of her pain. Also has better Bra support. Less breast pain   Lab work shows total cholesterol 152, LDL 80, HDL  56  Echocardiogram in the hospital showed normal LV systolic function ejection fraction 50-55%, mild MR, mild to moderate TR with right ventricular systolic pressures 23-53  EKG shows normal sinus rhythm, heart rate 62 beats per minute ,   diffuse ST and T-wave abnormality  Outpatient Encounter Prescriptions as of 08/04/2013  Medication Sig  . aspirin 162 MG EC tablet Take 162 mg by mouth daily.  . cetirizine (ZYRTEC) 10 MG chewable tablet Chew 10 mg by mouth daily.  . clopidogrel (PLAVIX) 75 MG tablet TAKE ONE TABLET BY MOUTH EVERY DAY  . CRESTOR 40 MG tablet TAKE ONE TABLET BY MOUTH EVERY DAY  . furosemide (LASIX) 20 MG tablet TAKE ONE TABLET BY MOUTH EVERY DAY FOR SWELLING. MAY TAKE 1 ADDITIONAL TABLET AS NEEDED FOR SWELLING/FLUID.  [name] isosorbide mononitrate (IMDUR) 30 MG 24 hr tablet Take 1 tablet (30 mg total) by mouth 2 (two) times daily.  [name] lisinopril (PRINIVIL,ZESTRIL) 20 MG tablet TAKE ONE TABLET BY MOUTH EVERY DAY  . lisinopril (PRINIVIL,ZESTRIL) 20 MG tablet Take 1 tablet (20 mg total) by mouth daily.  . meloxicam (MOBIC) 15 MG tablet Take 15 mg by mouth daily.  . metoprolol tartrate (LOPRESSOR) 25 MG tablet Take 1 tablet (25 mg total) by mouth 2 (two) times daily.  . nitroGLYCERIN (NITROSTAT) 0.4 MG SL tablet Place 0.4 mg under the tongue every 5 (five) minutes as needed.    . NON FORMULARY CPAP daily at bedtime.  [name] omeprazole (PRILOSEC) 40 MG capsule Take 40 mg by mouth daily.  Review of Systems  Constitutional: Negative.   HENT: Negative.   Eyes: Negative.   Respiratory: Negative.   Cardiovascular: Positive for chest pain.  Gastrointestinal: Positive for diarrhea.  Endocrine: Negative.   Musculoskeletal: Positive for arthralgias, back pain, joint swelling and myalgias.  Skin: Negative.   Allergic/Immunologic: Negative.   Neurological: Negative.   Hematological: Negative.   Psychiatric/Behavioral: Negative.   All other systems reviewed and are negative.   BP  120/86  Pulse 62  Ht 5\' 5"  (1.651 m)  Wt 206 lb 4 oz (93.554 kg)  BMI 34.32 kg/m2  Physical Exam  Nursing note and vitals reviewed. Constitutional: She is oriented to person, place, and time. She appears well-developed and well-nourished.  HENT:  Head: Normocephalic.  Nose: Nose normal.  Mouth/Throat: Oropharynx is clear and moist.  Eyes: Conjunctivae are normal. Pupils are equal, round, and reactive to light.  Neck: Normal range of motion. Neck supple. No JVD present.  Cardiovascular: Normal rate, regular rhythm, S1 normal, S2 normal, normal heart sounds and intact distal pulses.  Exam reveals no gallop and no friction rub.   No murmur heard. Pulmonary/Chest: Effort normal and breath sounds normal. No respiratory distress. She has no wheezes. She has no rales. She exhibits no tenderness.  Abdominal: Soft. Bowel sounds are normal. She exhibits no distension. There is no tenderness.  Musculoskeletal: Normal range of motion. She exhibits no edema and no tenderness.  Lymphadenopathy:    She has no cervical adenopathy.  Neurological: She is alert and oriented to person, place, and time. Coordination normal.  Skin: Skin is warm and dry. No rash noted. No erythema.  Psychiatric: She has a normal mood and affect. Her behavior is normal. Judgment and thought content normal.    Assessment and Plan       And.vs

## 2013-08-04 NOTE — Assessment & Plan Note (Signed)
Cholesterol is at goal on the current lipid regimen. No changes to the medications were made.  

## 2013-08-04 NOTE — Assessment & Plan Note (Signed)
Suggested she stay on her current medications. Appears relatively euvolemic. Weight is same or trending down

## 2013-08-10 ENCOUNTER — Encounter: Payer: Self-pay | Admitting: *Deleted

## 2013-08-17 ENCOUNTER — Other Ambulatory Visit: Payer: Self-pay

## 2013-08-17 MED ORDER — ROSUVASTATIN CALCIUM 40 MG PO TABS: ORAL_TABLET | ORAL | Status: DC

## 2013-08-17 MED ORDER — CLOPIDOGREL BISULFATE 75 MG PO TABS: ORAL_TABLET | ORAL | Status: DC

## 2013-08-17 NOTE — Telephone Encounter (Signed)
Refill sent for crestor.  

## 2013-08-17 NOTE — Telephone Encounter (Signed)
Refill sent for plavix  

## 2013-10-05 ENCOUNTER — Telehealth: Payer: Self-pay

## 2013-10-05 NOTE — Telephone Encounter (Signed)
Spoke w/ pt.  Advised her that Dr. [name] had filled inhaler rx until she could see her PCP. She is agreeable to speaking w/ Dr. Humphrey Rolls for further instructions.

## 2013-10-05 NOTE — Telephone Encounter (Signed)
Pt states Dr [name] prescribed an inhaler, Proventil, and it causes her chest to hurt, due to it has sulfa in it, and she is allergic to sulfa . Pt requests a different inhaler to be called in.

## 2013-11-25 ENCOUNTER — Other Ambulatory Visit: Payer: Self-pay | Admitting: Cardiovascular Disease

## 2014-01-10 ENCOUNTER — Telehealth: Payer: Self-pay

## 2014-01-10 NOTE — Telephone Encounter (Signed)
Request from [facility] , sent to [place] on 01/10/2014 . Also mailed release information to pt.

## 2014-01-20 ENCOUNTER — Encounter: Payer: Self-pay | Admitting: Cardiovascular Disease

## 2014-01-20 ENCOUNTER — Ambulatory Visit (INDEPENDENT_AMBULATORY_CARE_PROVIDER_SITE_OTHER): Payer: Medicare Other | Admitting: Cardiovascular Disease

## 2014-01-20 VITALS — BP 106/72 | HR 59 | Ht 66.0 in | Wt 219.0 lb

## 2014-01-20 DIAGNOSIS — R0789 Other chest pain: Secondary | ICD-10-CM

## 2014-01-20 DIAGNOSIS — Z951 Presence of aortocoronary bypass graft: Secondary | ICD-10-CM

## 2014-01-20 DIAGNOSIS — R252 Cramp and spasm: Secondary | ICD-10-CM

## 2014-01-20 DIAGNOSIS — R079 Chest pain, unspecified: Secondary | ICD-10-CM

## 2014-01-20 DIAGNOSIS — I10 Essential (primary) hypertension: Secondary | ICD-10-CM

## 2014-01-20 DIAGNOSIS — I2510 Atherosclerotic heart disease of native coronary artery without angina pectoris: Secondary | ICD-10-CM

## 2014-01-20 DIAGNOSIS — I5022 Chronic systolic (congestive) heart failure: Secondary | ICD-10-CM

## 2014-01-20 DIAGNOSIS — I509 Heart failure, unspecified: Secondary | ICD-10-CM

## 2014-01-20 DIAGNOSIS — R0602 Shortness of breath: Secondary | ICD-10-CM

## 2014-01-20 HISTORY — DX: Other chest pain: R07.89

## 2014-01-20 MED ORDER — MELOXICAM 15 MG PO TABS: 15.0000 mg | ORAL_TABLET | Freq: Every day | ORAL | Status: DC

## 2014-01-20 MED ORDER — POTASSIUM CHLORIDE ER 10 MEQ PO TBCR: 10.0000 meq | EXTENDED_RELEASE_TABLET | Freq: Every day | ORAL | Status: DC

## 2014-01-20 MED ORDER — TRAMADOL-ACETAMINOPHEN 37.5-325 MG PO TABS: 1.0000 | ORAL_TABLET | Freq: Four times a day (QID) | ORAL | Status: DC | PRN

## 2014-01-20 MED ORDER — OMEPRAZOLE 40 MG PO CPDR: 40.0000 mg | DELAYED_RELEASE_CAPSULE | Freq: Every day | ORAL | Status: DC

## 2014-01-20 MED ORDER — ISOSORBIDE MONONITRATE ER 30 MG PO TB24: 30.0000 mg | ORAL_TABLET | Freq: Two times a day (BID) | ORAL | Status: DC

## 2014-01-20 MED ORDER — NITROGLYCERIN 0.4 MG SL SUBL: 0.4000 mg | SUBLINGUAL_TABLET | SUBLINGUAL | Status: DC | PRN

## 2014-01-20 MED ORDER — ONE-DAILY MULTI VITAMINS PO TABS: 1.0000 | ORAL_TABLET | Freq: Every day | ORAL | Status: DC

## 2014-01-20 MED ORDER — LISINOPRIL 20 MG PO TABS: 20.0000 mg | ORAL_TABLET | Freq: Every day | ORAL | Status: DC

## 2014-01-20 MED ORDER — ALBUTEROL SULFATE HFA 108 (90 BASE) MCG/ACT IN AERS: 2.0000 | INHALATION_SPRAY | Freq: Four times a day (QID) | RESPIRATORY_TRACT | Status: DC | PRN

## 2014-01-20 MED ORDER — MECLIZINE HCL 25 MG PO TABS: 25.0000 mg | ORAL_TABLET | Freq: Two times a day (BID) | ORAL | Status: DC

## 2014-01-20 MED ORDER — CETIRIZINE HCL 10 MG PO CHEW: 10.0000 mg | CHEWABLE_TABLET | Freq: Every day | ORAL | Status: DC

## 2014-01-20 MED ORDER — NEBIVOLOL HCL 10 MG PO TABS: 10.0000 mg | ORAL_TABLET | Freq: Every day | ORAL | Status: DC

## 2014-01-20 MED ORDER — METOPROLOL TARTRATE 25 MG PO TABS: ORAL_TABLET | ORAL | Status: DC

## 2014-01-20 MED ORDER — OLMESARTAN MEDOXOMIL-HCTZ 20-12.5 MG PO TABS: 1.0000 | ORAL_TABLET | Freq: Every day | ORAL | Status: DC

## 2014-01-20 MED ORDER — CLOPIDOGREL BISULFATE 75 MG PO TABS: ORAL_TABLET | ORAL | Status: DC

## 2014-01-20 MED ORDER — FUROSEMIDE 20 MG PO TABS: ORAL_TABLET | ORAL | Status: DC

## 2014-01-20 MED ORDER — ROSUVASTATIN CALCIUM 40 MG PO TABS: ORAL_TABLET | ORAL | Status: DC

## 2014-01-20 NOTE — Assessment & Plan Note (Signed)
Suggested she continue on her diuretics for now. We'll check a basic metabolic panel today to help guide diuretic management

## 2014-01-20 NOTE — Assessment & Plan Note (Signed)
Blood pressure is well controlled on today's visit. No changes made to the medications. 

## 2014-01-20 NOTE — Patient Instructions (Addendum)
You are doing well. No medication changes were made.  We will check your labs today, BMP and megnesium  Please call us if you have new issues that need to be addressed before your next appt.  Your physician wants you to follow-up in: 6 months.  You will receive a reminder letter in the mail two months in advance. If you don't receive a letter, please call our office to schedule the follow-up appointment.

## 2014-01-20 NOTE — Assessment & Plan Note (Signed)
Currently with no symptoms of angina. No further workup at this time. Continue current medication regimen. 

## 2014-01-20 NOTE — Assessment & Plan Note (Addendum)
Left-sided chest cramping. We will check a basic metabolic panel today to evaluate potassium and magnesium. She has been taking high-dose diuretics for shortness of breath

## 2014-01-20 NOTE — Progress Notes (Signed)
Patient ID: [name], female    DOB: Sep 21, 1960, 53 y.o.   MRN: [id number]  HPI Comments: 53 yo woman  with a long smoking history,  h/o CAD s/p  NSTEMI at [facility]  1/12, with BMS to pLAD (80% stenosis), residual severe stenosis of the RCA which is a nondominant small vessel, With continued chest pain, repeat cardiac catheterization showing distal left main stenosis involving a high grade ostial LAD stenosis and circumflex disease approximately 90%, 90% diagonal disease, 80% nondominant RCA disease with ejection fraction 45%, who underwent bypass surgery x4 with a LIMA to the LAD, vein graft to the diagonal, vein graft to the marginal and vein graft to the RCA,  Also with peripheral vascular disease, S/p atherectomy and PTA of her right SFA on 01/15/11,  who presents for routine followup.   In followup today, she reports that she is having some cramping on the left side of her chest. She is taking lots of diuretic as she feels that her breathing is not normal and the fluid is "building up". She denies any leg edema. No abdominal bloating. She continues to use her CPAP, inhalers. No significant chest pain with exertion  Prior  outpatient stress test  July 2013 showed significant breast attenuation artifact, inferior wall artifact from GI uptake. Overall no significant ischemia. History of chronic left arm and upper left chest  pain and was seen pain clinic. Prior diagnosis of carpal tunnel syndrome  occasional left breast pain radiating to her mediastinum.    Previously seen by ear nose throat for vestibular testing as a cause of her dizziness.  EGD and colonoscopy which by her report showed hiatal hernia. Prior problems with swallowing  tenderness and swelling of her legs bilaterally in the past She has had MRI of the neck showing cervical disc disease previous tried on ranexa for chest pain relief with no improvement of her symptoms. Medication was stopped  Lab work shows total cholesterol 152,  LDL 80, HDL 56  Echocardiogram in the hospital showed normal LV systolic function ejection fraction 50-55%, mild MR, mild to moderate TR with right ventricular systolic pressures 40-98  EKG shows normal sinus rhythm, heart rate 59 beats per minute ,   diffuse ST and T-wave abnormality  Outpatient Encounter Prescriptions as of 01/20/2014  Medication Sig  . albuterol (PROVENTIL HFA;VENTOLIN HFA) 108 (90 BASE) MCG/ACT inhaler Inhale 2 puffs into the lungs every 6 (six) hours as needed for wheezing or shortness of breath.  [name] aspirin 162 MG EC tablet Take 162 mg by mouth daily.  . cetirizine (ZYRTEC) 10 MG chewable tablet Chew 10 mg by mouth daily.  . clopidogrel (PLAVIX) 75 MG tablet TAKE ONE TABLET BY MOUTH EVERY DAY  . furosemide (LASIX) 20 MG tablet TAKE ONE TABLET BY MOUTH EVERY DAY FOR SWELLING. MAY TAKE 1 ADDITIONAL TABLET AS NEEDED FOR SWELLING/FLUID.  [name] isosorbide mononitrate (IMDUR) 30 MG 24 hr tablet Take 1 tablet (30 mg total) by mouth 2 (two) times daily.  [name] KLOR-CON M10 10 MEQ tablet Take 10 mEq by mouth once.   [name] lisinopril (PRINIVIL,ZESTRIL) 20 MG tablet Take 1 tablet (20 mg total) by mouth daily.  . meclizine (ANTIVERT) 25 MG tablet Take 25 mg by mouth 2 (two) times daily.   . meloxicam (MOBIC) 15 MG tablet Take 15 mg by mouth daily.  . metoprolol tartrate (LOPRESSOR) 25 MG tablet TAKE ONE TABLET BY MOUTH TWICE DAILY  . Multiple Vitamin (MULTI VITAMIN DAILY PO) Take by mouth.  [name]  nitroGLYCERIN (NITROSTAT) 0.4 MG SL tablet Place 0.4 mg under the tongue every 5 (five) minutes as needed.    . NON FORMULARY CPAP daily at bedtime.  [name] omeprazole (PRILOSEC) 40 MG capsule Take 40 mg by mouth daily.  . potassium chloride (K-DUR) 10 MEQ tablet Take 1 tablet (10 mEq total) by mouth daily.  . rosuvastatin (CRESTOR) 40 MG tablet TAKE ONE TABLET BY MOUTH EVERY DAY  . traMADol-acetaminophen (ULTRACET) 37.5-325 MG per tablet Take 1-2 tablets by mouth every 6 (six) hours as needed.     Review of  Systems  Constitutional: Negative.   HENT: Negative.   Eyes: Negative.   Respiratory: Negative.   Cardiovascular: Negative.   Gastrointestinal: Positive for diarrhea.  Endocrine: Negative.   Musculoskeletal: Positive for arthralgias, back pain, joint swelling and myalgias.  Skin: Negative.   Allergic/Immunologic: Negative.   Neurological: Negative.   Hematological: Negative.   Psychiatric/Behavioral: Negative.   All other systems reviewed and are negative.  BP 106/72  Pulse 59  Ht 5\' 6"  (1.676 m)  Wt 219 lb (99.338 kg)  BMI 35.36 kg/m2  Physical Exam  Nursing note and vitals reviewed. Constitutional: She is oriented to person, place, and time. She appears well-developed and well-nourished.  HENT:  Head: Normocephalic.  Nose: Nose normal.  Mouth/Throat: Oropharynx is clear and moist.  Eyes: Conjunctivae are normal. Pupils are equal, round, and reactive to light.  Neck: Normal range of motion. Neck supple. No JVD present.  Cardiovascular: Normal rate, regular rhythm, S1 normal, S2 normal, normal heart sounds and intact distal pulses.  Exam reveals no gallop and no friction rub.   No murmur heard. Pulmonary/Chest: Effort normal and breath sounds normal. No respiratory distress. She has no wheezes. She has no rales. She exhibits no tenderness.  Abdominal: Soft. Bowel sounds are normal. She exhibits no distension. There is no tenderness.  Musculoskeletal: Normal range of motion. She exhibits no edema and no tenderness.  Lymphadenopathy:    She has no cervical adenopathy.  Neurological: She is alert and oriented to person, place, and time. Coordination normal.  Skin: Skin is warm and dry. No rash noted. No erythema.  Psychiatric: She has a normal mood and affect. Her behavior is normal. Judgment and thought content normal.    Assessment and Plan       And.vs

## 2014-01-20 NOTE — Assessment & Plan Note (Signed)
Atypical left-sided chest pain, likely from muscle cramping. No medication changes made

## 2014-01-21 LAB — BASIC METABOLIC PANEL
BUN/Creatinine Ratio: 22 (ref 9–23)
BUN: 16 mg/dL (ref 6–24)
CHLORIDE: 105 mmol/L (ref 97–108)
CO2: 24 mmol/L (ref 18–29)
CREATININE: 0.74 mg/dL (ref 0.57–1.00)
Calcium: 9.5 mg/dL (ref 8.7–10.2)
GFR calc Af Amer: 105 mL/min/{1.73_m2} (ref >59)
GFR calc non Af Amer: 91 mL/min/{1.73_m2} (ref >59)
GLUCOSE: 91 mg/dL (ref 65–99)
Potassium: 4.2 mmol/L (ref 3.5–5.2)
Sodium: 142 mmol/L (ref 134–144)

## 2014-01-21 LAB — MAGNESIUM: MAGNESIUM: 1.9 mg/dL (ref 1.6–2.6)

## 2014-01-26 ENCOUNTER — Other Ambulatory Visit: Payer: Self-pay | Admitting: Cardiovascular Disease

## 2014-03-20 ENCOUNTER — Emergency Department: Payer: Self-pay | Admitting: Emergency Medicine

## 2014-05-02 ENCOUNTER — Encounter: Payer: Self-pay | Admitting: Cardiovascular Disease

## 2014-05-02 ENCOUNTER — Ambulatory Visit (INDEPENDENT_AMBULATORY_CARE_PROVIDER_SITE_OTHER): Payer: Medicare Other | Admitting: Cardiovascular Disease

## 2014-05-02 VITALS — BP 128/90 | HR 60 | Ht 66.0 in | Wt 223.8 lb

## 2014-05-02 DIAGNOSIS — I739 Peripheral vascular disease, unspecified: Secondary | ICD-10-CM

## 2014-05-02 DIAGNOSIS — E785 Hyperlipidemia, unspecified: Secondary | ICD-10-CM

## 2014-05-02 DIAGNOSIS — I5022 Chronic systolic (congestive) heart failure: Secondary | ICD-10-CM

## 2014-05-02 DIAGNOSIS — I159 Secondary hypertension, unspecified: Secondary | ICD-10-CM

## 2014-05-02 DIAGNOSIS — I2510 Atherosclerotic heart disease of native coronary artery without angina pectoris: Secondary | ICD-10-CM

## 2014-05-02 DIAGNOSIS — Z951 Presence of aortocoronary bypass graft: Secondary | ICD-10-CM

## 2014-05-02 DIAGNOSIS — R0789 Other chest pain: Secondary | ICD-10-CM

## 2014-05-02 NOTE — Progress Notes (Signed)
Patient ID: Zachary George, female    DOB: 27-Jan-1961, 53 y.o.   MRN: [id number]  HPI Comments: 53 yo woman with a long smoking history,  h/o CAD s/p  NSTEMI at [facility]  1/12, with BMS to pLAD (80% stenosis), residual severe stenosis of the RCA which is a nondominant small vessel, With continued chest pain, repeat cardiac catheterization showing distal left main stenosis involving a high grade ostial LAD stenosis and circumflex disease approximately 90%, 90% diagonal disease, 80% nondominant RCA disease with ejection fraction 45%, who underwent bypass surgery x4 with a LIMA to the LAD, vein graft to the diagonal, vein graft to the marginal and vein graft to the RCA,  Also with peripheral vascular disease, S/p atherectomy and PTA of her right SFA on 01/15/11,  who presents for routine followup.   In followup today, she has a viral laryngitis. She is wearing a mask. She continues to use her CPAP, inhalers. No significant chest pain with exertion Recently started back on Lasix for fluid retention. Otherwise feels that she is at her baseline  Prior  outpatient stress test  July 2013 showed significant breast attenuation artifact, inferior wall artifact from GI uptake. Overall no significant ischemia. History of chronic left arm and upper left chest  pain and was seen pain clinic. Prior diagnosis of carpal tunnel syndrome  occasional left breast pain radiating to her mediastinum.    Previously seen by ear nose throat for vestibular testing as a cause of her dizziness.  EGD and colonoscopy which by her report showed hiatal hernia. Prior problems with swallowing  tenderness and swelling of her legs bilaterally in the past She has had MRI of the neck showing cervical disc disease previous tried on ranexa for chest pain relief with no improvement of her symptoms. Medication was stopped  Lab work shows total cholesterol 151, LDL 79, HDL 52, hemoglobin A1c 6.1  Echocardiogram in the hospital showed normal LV  systolic function ejection fraction 50-55%, mild MR, mild to moderate TR with right ventricular systolic pressures 32-95  EKG shows normal sinus rhythm, heart rate 60 beats per minute ,   diffuse ST and T-wave abnormality  Outpatient Encounter Prescriptions as of 05/02/2014  Medication Sig  . albuterol (PROVENTIL HFA;VENTOLIN HFA) 108 (90 BASE) MCG/ACT inhaler Inhale 2 puffs into the lungs every 6 (six) hours as needed for wheezing or shortness of breath.  [name] aspirin 162 MG EC tablet Take 162 mg by mouth daily.  . cetirizine (ZYRTEC) 10 MG chewable tablet Chew 1 tablet (10 mg total) by mouth daily.  . clopidogrel (PLAVIX) 75 MG tablet TAKE ONE TABLET BY MOUTH EVERY DAY  . furosemide (LASIX) 20 MG tablet TAKE ONE TABLET BY MOUTH EVERY DAY FOR SWELLING. MAY TAKE 1 ADDITIONAL TABLET AS NEEDED FOR SWELLING/FLUID.  [name] isosorbide mononitrate (IMDUR) 30 MG 24 hr tablet Take 1 tablet (30 mg total) by mouth 2 (two) times daily.  [name] lisinopril (PRINIVIL,ZESTRIL) 20 MG tablet Take 1 tablet (20 mg total) by mouth daily.  . meclizine (ANTIVERT) 25 MG tablet Take 1 tablet (25 mg total) by mouth 2 (two) times daily.  . meloxicam (MOBIC) 15 MG tablet Take 1 tablet (15 mg total) by mouth daily.  . metoprolol tartrate (LOPRESSOR) 25 MG tablet TAKE ONE TABLET BY MOUTH TWICE DAILY  . Multiple Vitamin (MULTIVITAMIN) tablet Take 1 tablet by mouth daily.  . nitroGLYCERIN (NITROSTAT) 0.4 MG SL tablet Place 1 tablet (0.4 mg total) under the tongue every 5 (five) minutes  as needed.  . NON FORMULARY cpap at bedtime  . omeprazole (PRILOSEC) 40 MG capsule Take 1 capsule (40 mg total) by mouth daily.  . potassium chloride (K-DUR) 10 MEQ tablet Take 1 tablet (10 mEq total) by mouth daily.  . rosuvastatin (CRESTOR) 40 MG tablet TAKE ONE TABLET BY MOUTH EVERY DAY  . traMADol-acetaminophen (ULTRACET) 37.5-325 MG per tablet Take 1-2 tablets by mouth every 6 (six) hours as needed.   Review of Systems  Constitutional: Negative.    HENT: Negative.   Eyes: Negative.   Respiratory: Negative.   Cardiovascular: Negative.   Gastrointestinal: Positive for diarrhea.  Endocrine: Negative.   Musculoskeletal: Positive for arthralgias, back pain, joint swelling and myalgias.  Skin: Negative.   Allergic/Immunologic: Negative.   Neurological: Negative.   Hematological: Negative.   Psychiatric/Behavioral: Negative.   All other systems reviewed and are negative.  BP 128/90  Pulse 60  Ht 5\' 6"  (1.676 m)  Wt 223 lb 12 oz (101.492 kg)  BMI 36.13 kg/m2  Physical Exam  Nursing note and vitals reviewed. Constitutional: She is oriented to person, place, and time. She appears well-developed and well-nourished.  HENT:  Head: Normocephalic.  Nose: Nose normal.  Mouth/Throat: Oropharynx is clear and moist.  Eyes: Conjunctivae are normal. Pupils are equal, round, and reactive to light.  Neck: Normal range of motion. Neck supple. No JVD present.  Cardiovascular: Normal rate, regular rhythm, S1 normal, S2 normal, normal heart sounds and intact distal pulses.  Exam reveals no gallop and no friction rub.   No murmur heard. Pulmonary/Chest: Effort normal and breath sounds normal. No respiratory distress. She has no wheezes. She has no rales. She exhibits no tenderness.  Abdominal: Soft. Bowel sounds are normal. She exhibits no distension. There is no tenderness.  Musculoskeletal: Normal range of motion. She exhibits no edema and no tenderness.  Lymphadenopathy:    She has no cervical adenopathy.  Neurological: She is alert and oriented to person, place, and time. Coordination normal.  Skin: Skin is warm and dry. No rash noted. No erythema.  Psychiatric: She has a normal mood and affect. Her behavior is normal. Judgment and thought content normal.    Assessment and Plan

## 2014-05-02 NOTE — Patient Instructions (Signed)
You are doing well. No medication changes were made.  Please call us if you have new issues that need to be addressed before your next appt.  Your physician wants you to follow-up in: 6 months.  You will receive a reminder letter in the mail two months in advance. If you don't receive a letter, please call our office to schedule the follow-up appointment.   

## 2014-05-02 NOTE — Assessment & Plan Note (Signed)
She recently restarted Lasix when necessary for fluid retention, ankle edema Otherwise stable symptoms

## 2014-05-02 NOTE — Assessment & Plan Note (Signed)
Continue aggressive cholesterol management. LDL less than 70.   

## 2014-05-02 NOTE — Assessment & Plan Note (Signed)
Blood pressure is well controlled on today's visit. No changes made to the medications. 

## 2014-05-02 NOTE — Assessment & Plan Note (Signed)
Currently with no symptoms of angina. No further workup at this time. Continue current medication regimen. 

## 2014-05-02 NOTE — Assessment & Plan Note (Signed)
Cholesterol is at goal on the current lipid regimen. No changes to the medications were made.  

## 2014-05-02 NOTE — Assessment & Plan Note (Signed)
Recovered well from bypass. No further symptoms

## 2014-05-22 ENCOUNTER — Ambulatory Visit: Payer: Self-pay | Admitting: Internal Medicine

## 2014-05-27 ENCOUNTER — Other Ambulatory Visit: Payer: Self-pay | Admitting: Cardiovascular Disease

## 2014-05-29 ENCOUNTER — Other Ambulatory Visit: Payer: Self-pay | Admitting: *Deleted

## 2014-05-29 MED ORDER — FUROSEMIDE 20 MG PO TABS: ORAL_TABLET | ORAL | Status: DC

## 2014-05-29 NOTE — Telephone Encounter (Signed)
Requested Prescriptions   Signed Prescriptions Disp Refills  . furosemide (LASIX) 20 MG tablet 180 tablet 3    Sig: TAKE ONE TABLET BY MOUTH EVERY DAY FOR SWELLING. MAY TAKE 1 ADDITIONAL TABLET AS NEEDED FOR SWELLING/FLUID.    Authorizing Provider: Minna Merritts    Ordering User: Britt Bottom

## 2014-05-31 ENCOUNTER — Ambulatory Visit: Payer: Self-pay | Admitting: Internal Medicine

## 2014-06-05 ENCOUNTER — Other Ambulatory Visit: Payer: Self-pay | Admitting: *Deleted

## 2014-06-05 MED ORDER — FUROSEMIDE 20 MG PO TABS: ORAL_TABLET | ORAL | Status: DC

## 2014-07-19 ENCOUNTER — Ambulatory Visit: Payer: Medicare Other | Admitting: Cardiovascular Disease

## 2014-07-28 ENCOUNTER — Encounter: Payer: Self-pay | Admitting: Internal Medicine

## 2014-08-28 ENCOUNTER — Other Ambulatory Visit: Payer: Self-pay | Admitting: Cardiovascular Disease

## 2014-10-16 ENCOUNTER — Telehealth: Payer: Self-pay

## 2014-10-16 NOTE — Telephone Encounter (Signed)
Received blood thinner info request from [facility] re colonoscopy TBA, requesting instructions on Plavix.  Per Christell Faith, PA, pt may hold Plavix 5-7 days prior to procedure, continue aspirin 81 mg, restart Plavix when ok'd by GI. Faxed to [phone].

## 2014-10-25 ENCOUNTER — Other Ambulatory Visit: Payer: Self-pay | Admitting: Cardiovascular Disease

## 2014-10-30 ENCOUNTER — Encounter: Payer: Self-pay | Admitting: Cardiovascular Disease

## 2014-10-30 ENCOUNTER — Ambulatory Visit (INDEPENDENT_AMBULATORY_CARE_PROVIDER_SITE_OTHER): Payer: Medicare Other | Admitting: Cardiovascular Disease

## 2014-10-30 VITALS — BP 120/64 | HR 81 | Ht 66.0 in | Wt 243.5 lb

## 2014-10-30 DIAGNOSIS — R0602 Shortness of breath: Secondary | ICD-10-CM

## 2014-10-30 DIAGNOSIS — I739 Peripheral vascular disease, unspecified: Secondary | ICD-10-CM | POA: Diagnosis not present

## 2014-10-30 DIAGNOSIS — I2510 Atherosclerotic heart disease of native coronary artery without angina pectoris: Secondary | ICD-10-CM | POA: Diagnosis not present

## 2014-10-30 DIAGNOSIS — I5022 Chronic systolic (congestive) heart failure: Secondary | ICD-10-CM

## 2014-10-30 DIAGNOSIS — I159 Secondary hypertension, unspecified: Secondary | ICD-10-CM

## 2014-10-30 DIAGNOSIS — E785 Hyperlipidemia, unspecified: Secondary | ICD-10-CM

## 2014-10-30 NOTE — Progress Notes (Signed)
Patient ID: [name], female    DOB: 1960/11/01, 54 y.o.   MRN: [id number]  HPI Comments: 54 yo woman with a long smoking history,  h/o CAD s/p  NSTEMI at [facility]  1/12, with BMS to pLAD (80% stenosis), residual severe stenosis of the RCA which is a nondominant small vessel, With continued chest pain, repeat cardiac catheterization showing distal left main stenosis involving a high grade ostial LAD stenosis and circumflex disease approximately 90%, 90% diagonal disease, 80% nondominant RCA disease with ejection fraction 45%, who underwent bypass surgery x4 with a LIMA to the LAD, vein graft to the diagonal, vein graft to the marginal and vein graft to the RCA,  Also with peripheral vascular disease, S/p atherectomy and PTA of her right SFA on 01/15/11,  who presents for routine followup of her coronary artery disease  In follow-up today, she reports that she has allergies. She has cough, nasal congestion. She's not taking any allergy medication She is having back problems, hip problems. Unable to exercise. Weight has been trending upwards. She does have some mild leg swelling. She continues to take Lasix daily with extra pill as needed Blood pressure has well controlled She continues to use her CPAP, inhalers. No significant chest pain with exertion  EKG on today's visit shows normal sinus rhythm with rate 81 bpm, diffuse T-wave abnormality in the anterior precordial leads, inferior leads  Other past medical history Prior  outpatient stress test  July 2013 showed significant breast attenuation artifact, inferior wall artifact from GI uptake. Overall no significant ischemia. History of chronic left arm and upper left chest  pain and was seen pain clinic. Prior diagnosis of carpal tunnel syndrome  occasional left breast pain radiating to her mediastinum.    Previously seen by ear nose throat for vestibular testing as a cause of her dizziness.  EGD and colonoscopy which by her report showed hiatal  hernia. Prior problems with swallowing  She has had MRI of the neck showing cervical disc disease  Lab work shows total cholesterol 151, LDL 79, HDL 52, hemoglobin A1c 6.1  Echocardiogram in the hospital showed normal LV systolic function ejection fraction 50-55%, mild MR, mild to moderate TR with right ventricular systolic pressures 12-75  Allergies  Allergen Reactions  . Latex     Rash   . Shellfish Allergy     Hard shellfish/swelling in throat  . Sulfonamide Derivatives     Swelling in throat.    Outpatient Encounter Prescriptions as of 10/30/2014  Medication Sig  . albuterol (PROVENTIL HFA;VENTOLIN HFA) 108 (90 BASE) MCG/ACT inhaler Inhale 2 puffs into the lungs every 6 (six) hours as needed for wheezing or shortness of breath.  [name] aspirin 162 MG EC tablet Take 162 mg by mouth daily.  . budesonide-formoterol (SYMBICORT) 160-4.5 MCG/ACT inhaler Inhale 2 puffs into the lungs 2 (two) times daily.  . cetirizine (ZYRTEC) 10 MG chewable tablet Chew 1 tablet (10 mg total) by mouth daily.  . clopidogrel (PLAVIX) 75 MG tablet TAKE ONE TABLET BY MOUTH EVERY DAY  . CRESTOR 40 MG tablet TAKE ONE TABLET BY MOUTH ONCE DAILY  . cyclobenzaprine (FLEXERIL) 10 MG tablet Take 10 mg by mouth 2 (two) times daily.   . furosemide (LASIX) 20 MG tablet TAKE ONE TABLET BY MOUTH EVERY DAY FOR SWELLING. MAY TAKE 1 ADDITIONAL TABLET AS NEEDED FOR SWELLING/FLUID.  [name] gabapentin (NEURONTIN) 300 MG capsule Take 300 mg by mouth at bedtime.  . isosorbide mononitrate (IMDUR) 30 MG 24  hr tablet Take 1 tablet (30 mg total) by mouth 2 (two) times daily.  [name] KLOR-CON M10 10 MEQ tablet TAKE ONE TABLET BY MOUTH ONCE DAILY  . lisinopril (PRINIVIL,ZESTRIL) 20 MG tablet TAKE ONE TABLET BY MOUTH ONCE DAILY  . meclizine (ANTIVERT) 25 MG tablet Take 1 tablet (25 mg total) by mouth 2 (two) times daily.  . meloxicam (MOBIC) 15 MG tablet Take 1 tablet (15 mg total) by mouth daily.  . metoprolol tartrate (LOPRESSOR) 25 MG tablet TAKE  ONE TABLET BY MOUTH TWICE DAILY  . Multiple Vitamin (MULTIVITAMIN) tablet Take 1 tablet by mouth daily.  . nitroGLYCERIN (NITROSTAT) 0.4 MG SL tablet Place 1 tablet (0.4 mg total) under the tongue every 5 (five) minutes as needed.  . NON FORMULARY cpap at bedtime  . omeprazole (PRILOSEC) 40 MG capsule Take 1 capsule (40 mg total) by mouth daily.  [name] PARoxetine (PAXIL) 10 MG tablet Take 10 mg by mouth daily.  . potassium chloride (K-DUR) 10 MEQ tablet Take 1 tablet (10 mEq total) by mouth daily.  . rosuvastatin (CRESTOR) 40 MG tablet TAKE ONE TABLET BY MOUTH EVERY DAY  . traMADol-acetaminophen (ULTRACET) 37.5-325 MG per tablet Take 1-2 tablets by mouth every 6 (six) hours as needed.  . [DISCONTINUED] clopidogrel (PLAVIX) 75 MG tablet TAKE ONE TABLET BY MOUTH ONCE DAILY (Patient not taking: Reported on 10/30/2014)  . [DISCONTINUED] lisinopril (PRINIVIL,ZESTRIL) 20 MG tablet Take 1 tablet (20 mg total) by mouth daily. (Patient not taking: Reported on 10/30/2014)    Past Medical History  Diagnosis Date  . Coronary artery disease     PCI of left circumflex 2003, PCI of the LAD 2012 with a bare-metal stent (2.5 x 8 mm);   s/p CABG 4/12:  L-LAD, S-Dx, S-OM, S-RCA (Dr. Prescott Gum)  . Hyperlipidemia   . Hypertension   . PAD (peripheral artery disease)     s/p Right SFA atherectomy and PTA 01/15/11, normal ABIs 01/2011  . Brachial neuritis or radiculitis NOS   . Systolic CHF, chronic     mild: echo 08/2010 - mildly reduced EF 40-45%, mild diffuse hypokinesis  . Anemia   . Cocaine abuse, in remission     clean x 24 years  . Arthritis   . Blood in stool   . History of cervical cancer     s/p cryotherapy  . Depression   . Generalized headaches     frequent  . GERD (gastroesophageal reflux disease)   . Seasonal allergies   . Cardiac arrhythmia   . Heart murmur   . History of hepatitis B     from eating undercooked liver  . History of kidney stones   . Urine incontinence   . History of drug abuse      cocaine, marijuana, clean since 1989  . Smoking history     quit 07/2010  . Chronic pain   . Cervical neck pain with evidence of disc disease     C5/6 disease, MRI done late 2012 - no records available  . Bronchitis   . Bacterial vaginitis   . History of MI (myocardial infarction)   . Thyroid disease     Past Surgical History  Procedure Laterality Date  . Coronary artery bypass graft  2012  . Cholecystectomy  1977  . Coronary stent placement  2003    S/P MI  . Coronary stent placement  2007    [place]  . Femoral artery stent  10/2010    right sided (Dr.  Cooper)  . Coronary angioplasty      w/ stent placement x2    Social History  reports that she quit smoking about 4 years ago. She has never used smokeless tobacco. She reports that she drinks alcohol. She reports that she uses illicit drugs (Cocaine).  Family History family history includes Cancer in her father and mother; Diabetes in her father; Heart failure in her father; Hypertension in her father. There is no history of Coronary artery disease or Stroke.  Review of Systems  Constitutional: Negative.   HENT: Negative.   Eyes: Negative.   Respiratory: Negative.   Cardiovascular: Negative.   Gastrointestinal: Positive for diarrhea.  Musculoskeletal: Positive for myalgias, back pain, joint swelling and arthralgias.  Skin: Negative.   Neurological: Negative.   Hematological: Negative.   Psychiatric/Behavioral: Negative.   All other systems reviewed and are negative.  BP 120/64 mmHg  Pulse 81  Ht 5\' 6"  (1.676 m)  Wt 243 lb 8 oz (110.451 kg)  BMI 39.32 kg/m2  Physical Exam  Constitutional: She is oriented to person, place, and time. She appears well-developed and well-nourished.  HENT:  Head: Normocephalic.  Nose: Nose normal.  Mouth/Throat: Oropharynx is clear and moist.  Eyes: Conjunctivae are normal. Pupils are equal, round, and reactive to light.  Neck: Normal range of motion. Neck supple. No JVD present.   Cardiovascular: Normal rate, regular rhythm, S1 normal, S2 normal, normal heart sounds and intact distal pulses.  Exam reveals no gallop and no friction rub.   No murmur heard. Pulmonary/Chest: Effort normal and breath sounds normal. No respiratory distress. She has no wheezes. She has no rales. She exhibits no tenderness.  Abdominal: Soft. Bowel sounds are normal. She exhibits no distension. There is no tenderness.  Musculoskeletal: Normal range of motion. She exhibits no edema or tenderness.  Lymphadenopathy:    She has no cervical adenopathy.  Neurological: She is alert and oriented to person, place, and time. Coordination normal.  Skin: Skin is warm and dry. No rash noted. No erythema.  Psychiatric: She has a normal mood and affect. Her behavior is normal. Judgment and thought content normal.    Assessment and Plan  Nursing note and vitals reviewed.

## 2014-10-30 NOTE — Patient Instructions (Signed)
You are doing well. No medication changes were made.  For allergies, Take cetirazine  (zyrtec) once a day  Please call us if you have new issues that need to be addressed before your next appt.  Your physician wants you to follow-up in: 6 months.  You will receive a reminder letter in the mail two months in advance. If you don't receive a letter, please call our office to schedule the follow-up appointment.

## 2014-10-30 NOTE — Assessment & Plan Note (Signed)
Currently with no symptoms of angina. No further workup at this time. Continue current medication regimen. 

## 2014-10-30 NOTE — Assessment & Plan Note (Signed)
Continue aggressive cholesterol management. LDL less than 70.

## 2014-10-30 NOTE — Assessment & Plan Note (Signed)
Euvolemic on today's visit. Encouraged her to take extra Lasix as needed for leg swelling. She does report high fluid intake

## 2014-10-30 NOTE — Assessment & Plan Note (Signed)
Encouraged her to stay on her Crestor. No recent lipid panel available

## 2014-10-30 NOTE — Assessment & Plan Note (Signed)
Blood pressure is well controlled on today's visit. No changes made to the medications. 

## 2014-11-01 ENCOUNTER — Telehealth: Payer: Self-pay

## 2014-11-01 NOTE — Telephone Encounter (Signed)
Pt states she went to the chiropractor yesterday, and they told her she needed to take magnesium. She asks if she is ok to take this. Please advise

## 2014-11-01 NOTE — Telephone Encounter (Signed)
Okay to take magnesium In excess can cause loose bowel movements

## 2014-11-02 NOTE — Telephone Encounter (Signed)
Spoke w/ pt.  Advised her of Dr. Donivan Scull recommendation.  She verbalizes understanding and states that her chiropractor suggested Mg due to muscle cramps.  She reports that her toes will cramp up and become painful. Asked her to call back w/ any questions or concerns.

## 2014-11-10 ENCOUNTER — Other Ambulatory Visit: Payer: Self-pay | Admitting: Internal Medicine

## 2014-11-10 DIAGNOSIS — N63 Unspecified lump in unspecified breast: Secondary | ICD-10-CM

## 2014-11-14 NOTE — Consult Note (Signed)
General Aspect 54 yo woman  with a long smoking history,  h/o CAD s/p  NSTEMI at [facility]  07/2010, with BMS to pLAD (80% stenosis),  with continued chest pain, repeat cardiac catheterization showing distal left main stenosis involving a high grade ostial LAD stenosis and circumflex disease approximately 90%, 90% diagonal disease, 80% nondominant RCA disease with ejection fraction 45%, who underwent bypass surgery x4 with a LIMA to the LAD, vein graft to the diagonal, vein graft to the marginal and vein graft to the RCA,  Also with peripheral vascular disease, S/p atherectomy and PTA of her right SFA on 01/15/11,  last seen in clinic 12/2011, presenting with chest pain and arm pain. Cardiology was consulted for possible angina.  She developed chest pressure last night before 10 pm, some left arm pain, under the biceps area. She has had this discomfort before and usually take NTG. She continues to have problems eating and food sticks (EGD scheduled). She has been having panic attacks. Friend in the family died suddenly. She is alone now, broke up with boyfriend. Currently feels well but does have a multitude of complaints and chronic pain issues. She reports having a pain med contract, possibly with [employer]? She is on disability for pain.  When last seen in 12/2011 in [facility], she was having chronic pain issues, radiating from her neck to her right arm with right hand weakness. She reported having degenerative disc disease around C5 and C6, chronic left shoulder pain, radiating to her left scapula, problems with swallowing and is scheduled for an EGD and sleep study in Aug 2013. She has chronic swelling and tenderness in her legs bilaterally, chronic right leg pain.   She has had rare episodes of chest pain requiring nitroglycerin and extra aspirin. She is limited in her ability to exert herself secondary to right leg pain.    Present Illness . SOCIAL HISTORY: The patient quit smoking  after her bypass surgery in 2012. Denies alcohol abuse.   FAMILY HISTORY: Positive for diabetes and coronary artery disease.   Physical Exam:   GEN well developed, well nourished, no acute distress, obese    HEENT red conjunctivae    NECK supple  No masses    RESP normal resp effort  clear BS    CARD Regular rate and rhythm  No murmur    LYMPH negative neck    EXTR negative cyanosis/clubbing, nonpitting edema    SKIN normal to palpation, No rashes    NEURO cranial nerves intact, motor/sensory function intact    PSYCH alert, A+O to time, place, person, good insight   Review of Systems:   Subjective/Chief Complaint arm pain, chest pain resolved    General: No Complaints    Skin: No Complaints    ENT: No Complaints    Eyes: No Complaints    Neck: No Complaints    Respiratory: No Complaints    Cardiovascular: Chest pain or discomfort  resolved    Gastrointestinal: No Complaints    Genitourinary: No Complaints    Vascular: No Complaints    Musculoskeletal: No Complaints    Neurologic: No Complaints    Hematologic: No Complaints    Endocrine: No Complaints    Psychiatric: No Complaints    Review of Systems: All other systems were reviewed and found to be negative    Medications/Allergies Reviewed Medications/Allergies reviewed     GERD - Esophageal Reflux:    CAD:    HTN:  Myocardial Infarct:    uterine ablation:    Gall Bladder:    Stent - Cardiac:        Admit Diagnosis:   [place]: 14-Feb-2012, Active, [place]      Admit Reason:   Elevated troponin level: (790.6) Active, ICD9, Other abnormal blood chemistry   Chest pain: (786.50) Active, ICD9, Unspecified chest pain   Arteriosclerotic cardiovascular disease (ASCVD): (429.2) Active, ICD9, Unspecified cardiovascular disease   Abnormal EKG: (794.31) Active, ICD9, Nonspecific abnormal electrocardiogram (ECG) (EKG)  Home Medications: Medication Instructions Status   acetaminophen-HYDROcodone 325 mg-5 mg tablet 1 tab(s) orally every 6 hours- as needed  Active  Ecotrin 325 mg oral enteric coated tablet 1 tab(s) orally once a day  Active  Plavix 75 mg oral tablet 1 tab(s) orally once a day  Active  omeprazole 40 mg oral delayed release capsule 1 cap(s) orally once a day Active  Zetia 10 mg oral tablet 1 tab(s) orally once a day Active  metoprolol succinate 25 mg oral tablet, extended release 1 tab(s) orally 2 times a day Active  Crestor 40 mg oral tablet 1 tab(s) orally once a day (at bedtime) Active  Klor-Con M20 oral tablet, extended release 2 tab(s) orally once a day Active  meloxicam 15 mg oral tablet 1 tab(s) orally once a day Active  furosemide 20 mg oral tablet tab(s) orally 2 times a day Active  lisinopril 10 mg oral tablet 1 tab(s) orally once a day Active  multivitamin  orally  Active  methylPREDNISolone 4 mg oral tablet  orally once a day Active  metronidazole 500 mg oral tablet 1 tab(s) orally 2 times a day Active   Lab Results:  Hepatic:  19-Jul-13 23:19    Bilirubin, Total 0.2   Alkaline Phosphatase 74   SGPT (ALT) 48 (12-78 NOTE: NEW REFERENCE RANGE 06/20/2011)   SGOT (AST) 27   Total Protein, Serum 7.2   Albumin, Serum 3.8  Routine Chem:  19-Jul-13 23:19    Result Comment TROPONIN - RESULTS VERIFIED BY REPEAT TESTING.  - READ-BACK PROCESS PERFORMED.  - C/KATHA HENDERSON 02/14/12 0045 [facility]  Result(s) reported on 14 Feb 2012 at 12:49AM.   B-Type Natriuretic Peptide [facility])  1292 (Result(s) reported on 14 Feb 2012 at 12:26AM.)   Glucose, Serum 92   BUN 16   Creatinine (comp) 1.05   Sodium, Serum 141   Potassium, Serum 3.6   Chloride, Serum 106   CO2, Serum 27   Calcium (Total), Serum 8.7   Osmolality (calc) 282   eGFR (African American) >60   eGFR (Non-African American) >60 (eGFR values <35m/min/1.73 m2 may be an indication of chronic kidney disease (CKD). Calculated eGFR is useful in patients with stable renal  function. The eGFR calculation will not be reliable in acutely ill patients when serum creatinine is changing rapidly. It is not useful in  patients on dialysis. The eGFR calculation may not be applicable to patients at the low and high extremes of body sizes, pregnant women, and vegetarians.)   Anion Gap 8  20-Jul-13 07:24    Result Comment TROPONIN - RESULTS VERIFIED BY REPEAT TESTING.  - PREV. C/ 02-14-12 '@0045'  BY [facility]..Marland KitchenJO  Result(s) reported on 14 Feb 2012 at 08:24AM.  Cardiac:  19-Jul-13 23:19    Troponin I  0.11 (0.00-0.05 0.05 ng/mL or less: NEGATIVE  Repeat testing in 3-6 hrs  if clinically indicated. >0.05 ng/mL: POTENTIAL  MYOCARDIAL INJURY. Repeat  testing in 3-6 hrs if  clinically indicated. NOTE: An increase or  decrease  of 30% or more on serial  testing suggests a  clinically important change)   CK, Total 131   CPK-MB, Serum 0.8 (Result(s) reported on 14 Feb 2012 at 12:26AM.)  20-Jul-13 07:24    Troponin I  0.10 (0.00-0.05 0.05 ng/mL or less: NEGATIVE  Repeat testing in 3-6 hrs  if clinically indicated. >0.05 ng/mL: POTENTIAL  MYOCARDIAL INJURY. Repeat  testing in 3-6 hrs if  clinically indicated. NOTE: An increase or decrease  of 30% or more on serial  testing suggests a  clinically important change)   CK, Total 98 (Result(s) reported on 14 Feb 2012 at 08:22AM.)  Routine Coag:  19-Jul-13 23:19    Activated PTT (APTT) 28.8 (A HCT value >55% may artifactually increase the APTT. In one study, the increase was an average of 19%. Reference: "Effect on Routine and Special Coagulation Testing Values of Citrate Anticoagulant Adjustment in Patients with High HCT Values." American Journal of Clinical Pathology 2006;126:400-405.)   Prothrombin 12.8   INR 0.9 (INR reference interval applies to patients on anticoagulant therapy. A single INR therapeutic range for coumarins is not optimal for all indications; however, the suggested range for most indications is 2.0  - 3.0. Exceptions to the INR Reference Range may include: Prosthetic heart valves, acute myocardial infarction, prevention of myocardial infarction, and combinations of aspirin and anticoagulant. The need for a higher or lower target INR must be assessed individually. Reference: The Pharmacology and Management of the Vitamin K  antagonists: the seventh ACCP Conference on Antithrombotic and Thrombolytic Therapy. OVANV.9166 Sept:126 (3suppl): [id number]. A HCT value >55% may artifactually increase the PT.  In one study,  the increase was an average of 25%. Reference:  "Effect on Routine and Special Coagulation Testing Values of Citrate Anticoagulant Adjustment in Patients with High HCT Values." American Journal of Clinical Pathology 2006;126:400-405.)  Routine Hem:  19-Jul-13 23:19    WBC (CBC) 10.8   RBC (CBC) 4.07   Hemoglobin (CBC) 12.8   Hematocrit (CBC) 38.3   Platelet Count (CBC)  144 (Result(s) reported on 14 Feb 2012 at 12:05AM.)   MCV 94   MCH 31.6   MCHC 33.5   RDW 13.9   EKG:   Interpretation EKG shows normal sinus rhythm with rate 62 beats per minute,  T-wave abnormality in V3 to V6, 2, 3, aVF ( unchanged from previous EKG)    Sulfa drugs: Swelling  Latex: Rash  Vital Signs/Nurse's Notes: **Vital Signs.:   20-Jul-13 08:45   Vital Signs Type Routine   Temperature Temperature (F) 98.6   Celsius 37   Temperature Source oral   Pulse Pulse 62   Respirations Respirations 18   Systolic BP Systolic BP 060   Diastolic BP (mmHg) Diastolic BP (mmHg) 87   Mean BP 110   Pulse Ox % Pulse Ox % 99   Pulse Ox Activity Level  At rest   Oxygen Delivery Room Air/ 21 %     Impression 54 yo woman  with a long smoking history,  h/o CAD s/p  bypass surgery x4 with a LIMA to the LAD 2012, vein graft to the diagonal, vein graft to the marginal and vein graft to the RCA,  Also with peripheral vascular disease, S/p atherectomy and PTA of her right SFA on 01/15/11,  last seen in clinic  01/20/2012, now presenting with chest pain and arm pain.   1) Chest pain: Atypical and typical features cardiac enz neg, EKG unchanged. She has had these sx in the  past. Possibly GI, unable to exclude panic attack, or angina EGD scheduled early aug for food sticking --Would hold heparin, ambulate If feels ok, would d/c with outpt workup --We have discussed an outpt stress test (lexiscan) Would continue outpt meds, increase ACE as you have done --She could take imdur 30 mg as well   2)HTN: BP could be elevated as I think she has run out of meds. ACE increased Could also add imdur 30 mg daily --I can adjust BP meds an an outpt  3) Elevated RVSP She feels a little congestion. Suspect she may have sleep apnea. Sleep study scheduled in Aug 2013 through Dr. Hiram Comber office Suggested she take lasix 40 mg daily, decrease fluid intake  4) H/O CAD, CABG no apparent ischemia enz neg, ekg unchanged Chest pain sx at rest out pt stress. We will call her to arrange Would continue asa, statin, b-blocker, ace   Electronic Signatures: Ida Rogue (MD)  (Signed 20-Jul-13 12:29)  Authored: General Aspect/Present Illness, History and Physical Exam, Review of System, Past Medical History, Health Issues, Home Medications, Labs, EKG , Allergies, Vital Signs/Nurse's Notes, Impression/Plan   Last Updated: 20-Jul-13 12:29 by Ida Rogue (MD)

## 2014-11-14 NOTE — Discharge Summary (Signed)
PATIENT NAME:  [name], [name] MR#:  [id number] DATE OF BIRTH:  07/30/1960  DATE OF ADMISSION:  02/14/2012 DATE OF DISCHARGE:  02/14/2012  ADMITTING DIAGNOSIS: Unstable angina.   DISCHARGE DIAGNOSES: 1. Chest pain, no cardiac injury, possible musculoskeletal pain. 2. Hypertension, poorly controlled. 3. Chronic obstructive pulmonary disease. 4. Hyperlipidemia.  5. Thrombocytopenia.  6. Questionable obstructive sleep apnea.   DISCHARGE CONDITION: Stable.   DISCHARGE MEDICATIONS: The patient is to resume her outpatient medications which are: 1. Acetaminophen/hydrocodone 325/5 mg 1 tablet every six hours as needed.  2. Aspirin 325 mg p.o. daily.  3. Plavix 75 mg p.o. daily. 4. Omeprazole 40 mg p.o. daily. 5. Zetia 10 mg p.o. daily. 6. Metoprolol succinate 25 mg p.o. twice daily.  7. Crestor 40 mg p.o. at bedtime. 8. Klor-Con 20 mEq 2 tablets once daily.  9. Meloxicam 15 mg p.o. daily. 10. Furosemide 20 mg p.o. twice daily. 11. Multivitamin once daily. 12. Methylprednisolone 4 mg p.o. daily. 13. Metronidazole 500 mg p.o. twice daily.  ADDITIONAL MEDICATIONS: 1. Lisinopril 20 mg p.o. daily dose. This is a new dose. 2. Imdur 30 mg p.o. daily.  DIET: 2 gram salt, low fat, low cholesterol.   DIET CONSISTENCY: Mechanical soft.  ACTIVITY LIMITATIONS: As tolerated.   FOLLOW-UP: Follow-up appointment with Dr. [name] in one week after discharge.   CONSULTANT: Dr. [name]   RADIOLOGIC STUDIES:  1. Chest x-ray, portable, single view, 02/13/2012 showed cardiomegaly, mild pulmonary prominence noted, mild CABG is noted. Mild congestive heart failure cannot be excluded according to the radiologist.  2. Echocardiogram left ventricular systolic function is low normal. Ejection fraction 50 to 55%. Septal motion is consistent with postoperative state. The right ventricular systolic function is normal. The left atrial size is normal. There is mild mitral regurgitation. There is mild to moderate  tricuspid regurgitation. Right ventricular systolic pressure is elevated to 30 to 40 mmHg.   REASON FOR ADMISSION: The patient is a 54 year old African American female with past medical history significant for history of coronary artery disease who presented to the hospital with complaints of chest pain. Please refer to Dr. Doy Hutching' admission note on 02/14/2012.   PHYSICAL EXAMINATION: On arrival to the Emergency Room, the patient's vitals revealed blood pressure 148/79, heart rate was 55, respiration rate was 16. She was afebrile. Physical exam was unremarkable.   LAB DATA: EKG revealed T wave inversions in V2 through V6. The patient's chest x-ray was unremarkable except for cardiomegaly and possible mild congestive heart failure. The patient's lab data showed elevated B-type Natriuretic Peptide of 1292. Normal BMP. Normal liver enzymes. Cardiac enzymes showed mild elevation of troponin to 0.11 on the first set; 0.10 on the second set; 0.09 on the third set. CBC was within normal limits except her platelet count was somewhat low at 144. Coagulation panel initially was unremarkable. The patient's urinalysis was also normal.   HOSPITAL COURSE: The patient was admitted to the hospital. She was started on beta-blockers, aspirin, Plavix, nitroglycerin topically, and heparin IV. She was consulted by Dr. [name]. Dr. [name] saw the patient in consultation the same day, 02/14/2012. His impression was a 54 year old woman with longstanding smoking history as well as coronary artery disease status post bypass surgery x4 with LIMA to LAD in 2012, vein graft to diagonal, vein graft to marginal and vein graft to RCA also with peripheral vascular disease status post atherectomy and PTA of right SFA on 01/15/2011, who was seen in the clinic in June 2013, presented with chest pain as  well as arm pain. He felt that the patient's chest pain is atypical, having atypical as well as typical features. Cardiac enzymes were negative  as well as EKG was unchanged. According to Dr. [name], the patient has had those symptoms in the past and he was concerned it was possibly GI symptoms as well as inability to exclude panic attack or angina. EGD scheduled for early August for food sticking in her throat and her esophagus, recommended to hold heparin and ambulate the patient. If she feels okay he recommends discharge her with outpatient work-up. He discussed outpatient stress test such as Lexiscan stress test and recommended to continue outpatient medications as well as increase ACE inhibitor for blood pressure control and recommended to start patient on Imdur at 30 mg p.o. daily dose which was done upon discharge.   In regards to hypertension, blood pressure was elevated and Dr. [name] felt that she could have run out of her medications. He recommended, as mentioned above, to increase her ACE inhibitor and add Imdur and adjust blood pressure medications as outpatient.  For elevated systolic pressures, according to Dr. [name] felt somewhat congested and also he was concerned that the patient may also have sleep apnea. The patient's sleep study is scheduled for August 2013 through Dr. Perrin Maltese office. He recommended to take Lasix 40 mg p.o. daily dose and decrease fluid intake.   In regards to coronary artery disease status post CABG, he felt that there was no apparent ischemia. The patient's enzymes were negative and EKG was unchanged. He felt that the patient's chest pain symptoms are stress and unlikely related to coronary artery disease. He also felt that patient should undergo outpatient stress test which will be arranged for her upon discharge. He recommended to continue aspirin, statin, beta-blocker, as well as ACE inhibitor upon discharge.   DISCHARGE VITAL SIGNS: Temperature 98.6, pulse 62, respiration rate 18, blood pressure 158/87, saturation 99% on room air at rest.   As mentioned above, the patient's ACE inhibitor,  lisinopril, was increased from 10 mg to 20 mg p.o. daily dose. She also had added Imdur. She is to have her blood pressure rechecked at Dr. Donivan Scull office in one week after discharge.      TIME SPENT: 40 minutes.  ____________________________ Theodoro Grist, MD rv:drc D: 02/14/2012 18:46:00 ET T: 02/16/2012 11:09:01 ET JOB#: [id number]  cc: Theodoro Grist, MD, <Dictator> Minna Merritts, MD  [place] MD ELECTRONICALLY SIGNED 02/18/2012 16:25

## 2014-11-14 NOTE — H&P (Signed)
PATIENT NAME:  [name], [name] MR#:  [id number] DATE OF BIRTH:  1961-01-31  DATE OF ADMISSION:  02/14/2012  REFERRING PHYSICIAN: Dr. Leslye Peer.  FAMILY PHYSICIAN: Dr. Lamonte Sakai. CARDIOLOGIST: Dr. [name].   REASON FOR ADMISSION: Chest pain worrisome for unstable angina.   HISTORY OF PRESENT ILLNESS: The patient is a 54 year old female with a history of coronary artery disease status post coronary artery bypass graft in 2012 as well as a history of hypertension. Presents to the emergency room with chest pain radiating to the left arm similar to her previous chest pain when she had myocardial infarctions. It was associated with shortness of breath. Currently, the patient is having left arm pain but denies chest pain. She is on oxygen in the emergency room, and her shortness of breath is gone. EKG showed some nonspecific changes but her troponin is mildly elevated. She is now admitted for further evaluation.   PAST MEDICAL HISTORY:  1. Atherosclerotic cardiovascular disease status post myocardial infarction.  2. Status post percutaneous transluminal coronary angioplasty with stent placement x2.  3. Status post coronary artery bypass graft.  4. Gastroesophageal reflux.  5. Benign hypertension.  6. Bacterial vaginitis.  7. Bronchitis.  8. History of uterine ablation surgery.  9. Status post cholecystectomy.   MEDICATIONS:  1. Plavix 75 mg p.o. daily.  2. Nexium 40 mg p.o. daily.  3. Aspirin 81 mg p.o. daily.  4. Combivent 2 puffs q.i.d. p.r.n. shortness of breath.  5. Norco 5/325 one to 2 p.o. q.6 hours p.r.n. pain.  6. Lasix 20 mg p.o. b.i.d.  7. Lopressor 25 mg p.o. b.i.d.  8. Crestor 40 mg p.o. at bedtime.  9. Flagyl 500 mg p.o. q.8 hours.  10. K-Dur 20 mEq p.o. daily. 11. Zetia 10 mg p.o. daily.  12. Mobic 15 mg p.o. daily.  13. Zestril 10 mg p.o. daily.  14. Prednisone taper as directed.   ALLERGIES: Latex and sulfa.   SOCIAL HISTORY: The patient quit smoking after her bypass  surgery in 2012. Denies alcohol abuse.   FAMILY HISTORY: Positive for diabetes and coronary artery disease.   REVIEW OF SYSTEMS: CONSTITUTIONAL: No fever or change in weight. EYES: No blurred or double vision. No glaucoma. ENT: No tinnitus or hearing loss. No nasal discharge or bleeding. No difficulty swallowing. RESPIRATORY: No cough or wheezing. Denies hemoptysis. No painful respiration. CARDIOVASCULAR: No orthopnea or palpitations. No syncope. GI: No nausea, vomiting, or diarrhea. No abdominal pain. No change in bowel habits.  GU: No dysuria or hematuria. No incontinence. ENDOCRINE: No polyuria or polydipsia. No heat or cold intolerance. HEMATOLOGIC: The patient denies anemia, easy bruising, or bleeding. LYMPHATIC: No swollen glands. MUSCULOSKELETAL: The patient denies pain in her neck, shoulders, knees, or hips. Does have some back pain. No gout. NEUROLOGIC: No did have some left arm numbness which is resolved. Denies weakness. No migraines, stroke or seizures. PSYCH: The patient denies anxiety, insomnia, or depression.   PHYSICAL EXAMINATION:  GENERAL: The patient is in no acute distress.   VITAL SIGNS: Vital signs are currently remarkable for a blood pressure of 148/79 with a heart rate of 55 and a respiratory rate of 16. She is afebrile.   HEENT: Normocephalic, atraumatic. Pupils equally round and reactive to light and accommodation. Extraocular movements are intact. Sclerae not icteric. Conjunctivae are clear. Oropharynx is clear.   NECK: Supple without jugular venous distention or bruits. No adenopathy or thyromegaly is noted.   LUNGS: Clear to auscultation and percussion without wheezes, rales, or rhonchi. No  dullness.   CARDIAC: Regular rate and rhythm with a normal S1, S2. No significant rubs, murmurs, or gallops. PMI is nondisplaced. Chest wall is nontender.   ABDOMEN: Soft, nontender with normoactive bowel sounds. No organomegaly or masses were appreciated. No hernias or bruits were  noted.   EXTREMITIES: Without clubbing, cyanosis, edema. Pulses were 2+ bilaterally.   SKIN: Warm and dry without rash or lesions.   NEUROLOGIC: Cranial nerves II through XII grossly intact. Deep tendon reflexes were symmetric. Motor and sensory exam is nonfocal.   PSYCH: The patient was alert and oriented to person, place, and time. She was cooperative and used good judgment.   LABORATORY DATA: EKG revealed T wave inversion in V2 through V6. Chest x-ray was unremarkable. White count was 10.8 with a hemoglobin of 12.8. Glucose was 92 with a BUN of 16, creatinine of 1.05 with a sodium of 141 and a potassium of 3.6. Total CK was 131 with an MB of 0.8 and a troponin of 0.11.   ASSESSMENT:  1. Chest pain worrisome for unstable angina.  2. Abnormal electrocardiogram.    3. Elevated troponin.  4. Atherosclerotic cardiovascular disease, status post coronary artery bypass graft.  5. Bacterial vaginitis.  6. Recent bronchitis.  7. Gastroesophageal reflux disease.   PLAN: The patient will be admitted to telemetry with IV heparin, Plavix, and topical nitrates.  We will continue her lisinopril and Lopressor. We will continue her treatment for bacterial vaginitis and bronchitis as per her outpatient regimen including her steroid taper. We will follow serial cardiac enzymes. We will obtain an echocardiogram. We will consult Cardiology later      today in regards to her unstable angina pattern. Follow up routine labs and chest x-ray within 24 to 36 hours. Further treatment and evaluation will depend upon the patient's progress.   TOTAL TIME SPENT ON THIS PATIENT: 50 minutes.    ____________________________ Leonie Douglas Doy Hutching, MD jds:vtd D: 02/14/2012 01:35:26 ET T: 02/14/2012 08:50:57 ET JOB#: [id number]  cc: Leonie Douglas. Doy Hutching, MD, <Dictator> Perrin Maltese, MD Aryn Kops Lennice Sites MD ELECTRONICALLY SIGNED 02/15/2012 21:30

## 2014-11-22 ENCOUNTER — Encounter: Payer: Self-pay | Admitting: Anesthesiology

## 2014-11-23 ENCOUNTER — Other Ambulatory Visit: Payer: Self-pay | Admitting: Cardiovascular Disease

## 2014-11-30 ENCOUNTER — Ambulatory Visit
Admission: RE | Admit: 2014-11-30 | Discharge: 2014-11-30 | Disposition: A | Discharge status: Home / Self Care | Payer: Medicare Other | Source: Ambulatory Visit | Attending: Internal Medicine | Admitting: Internal Medicine

## 2014-11-30 DIAGNOSIS — N63 Unspecified lump in unspecified breast: Secondary | ICD-10-CM

## 2014-11-30 DIAGNOSIS — R922 Inconclusive mammogram: Secondary | ICD-10-CM | POA: Diagnosis not present

## 2015-01-02 ENCOUNTER — Encounter: Admission: RE | Payer: Self-pay | Source: Ambulatory Visit

## 2015-01-02 SURGERY — COLONOSCOPY
Anesthesia: General

## 2015-01-08 ENCOUNTER — Ambulatory Visit: Admission: RE | Admit: 2015-01-08 | Payer: Medicare Other | Source: Ambulatory Visit | Admitting: Gastroenterology

## 2015-01-08 ENCOUNTER — Ambulatory Visit: Payer: Self-pay | Admitting: Urgent Care

## 2015-01-11 ENCOUNTER — Ambulatory Visit (INDEPENDENT_AMBULATORY_CARE_PROVIDER_SITE_OTHER): Payer: Medicare Other | Admitting: Urgent Care

## 2015-01-11 ENCOUNTER — Encounter: Payer: Self-pay | Admitting: Urgent Care

## 2015-01-11 VITALS — BP 145/90 | HR 71 | Temp 97.7°F | Ht 66.0 in | Wt 245.0 lb

## 2015-01-11 DIAGNOSIS — Z860100 Personal history of colon polyps, unspecified: Secondary | ICD-10-CM

## 2015-01-11 DIAGNOSIS — K219 Gastro-esophageal reflux disease without esophagitis: Secondary | ICD-10-CM

## 2015-01-11 DIAGNOSIS — I2510 Atherosclerotic heart disease of native coronary artery without angina pectoris: Secondary | ICD-10-CM

## 2015-01-11 DIAGNOSIS — K921 Melena: Secondary | ICD-10-CM | POA: Diagnosis not present

## 2015-01-11 HISTORY — DX: Melena: K92.1

## 2015-01-11 HISTORY — DX: Personal history of colonic polyps: Z86.010

## 2015-01-11 HISTORY — DX: Personal history of colon polyps, unspecified: Z86.0100

## 2015-01-11 MED ORDER — NA SULFATE-K SULFATE-MG SULF 17.5-3.13-1.6 GM/177ML PO SOLN: 1.0000 | ORAL | Status: DC

## 2015-01-11 NOTE — Assessment & Plan Note (Signed)
[name] is a pleasant 54 y.o. female with small volume intermittent hematochezia. Colonoscopy with Dr Allen Norris.  Differentials include colorectal carcinoma or polyp, inflammatory bowel disease versus benign anorectal source.  I have discussed risks & benefits which include, but are not limited to, bleeding, infection, perforation & drug reaction. The patient agrees with this plan & written consent will be obtained.

## 2015-01-11 NOTE — Patient Instructions (Signed)
EGD & Colonoscopy with Dr. Allen Norris at [facility] should stop your Plavix 5 days prior to your procedure Continue omeprazole 40 mg daily

## 2015-01-11 NOTE — Assessment & Plan Note (Signed)
Breakthrough symptoms on PPI. EGD with Dr. Allen Norris.  I have discussed risks & benefits which include, but are not limited to, bleeding, infection, perforation & drug reaction.  The patient agrees with this plan & written consent will be obtained.

## 2015-01-11 NOTE — Progress Notes (Signed)
Primary Care Physician: Perrin Maltese, MD Primary Gastroenterologist:  Dr Allen Norris  Chief Complaint  Patient presents with  . Colonoscopy    Consult-Taking Plavix and Aspirin Dr. [name]    HPI: [name] is a 54 y.o. female here to set up colonoscopy. In March 2016 and colonoscopy was supposed to be scheduled for hematochezia and history of colon polyps with a family history of colon cancer. Patient states that she canceled the procedure. She has been having some scant intermittent small volume hematochezia which she describes as bright red blood mixed in her stools. She is having constipation and can go to 3 days without a bowel movement. She has also had chronic GERD but more recently is having refractory heartburn. She's tried Prilosec 40 mg daily with minimal relief. Eyes any dysphasia, odynophagia, or weight loss. He is on Plavix and aspirin. Her cardiologist is Dr. [name] and has okayed for her Plavix to be stopped for 5 days prior to her procedure.  Current Outpatient Prescriptions  Medication Sig Dispense Refill  . albuterol (PROVENTIL HFA;VENTOLIN HFA) 108 (90 BASE) MCG/ACT inhaler Inhale 2 puffs into the lungs every 6 (six) hours as needed for wheezing or shortness of breath. 1 Inhaler 2  . aspirin 81 MG tablet Take 162 mg by mouth 2 (two) times daily.    . budesonide-formoterol (SYMBICORT) 160-4.5 MCG/ACT inhaler Inhale 2 puffs into the lungs 2 (two) times daily.    . cetirizine (ZYRTEC) 10 MG chewable tablet Chew 1 tablet (10 mg total) by mouth daily.    . clopidogrel (PLAVIX) 75 MG tablet TAKE ONE TABLET BY MOUTH EVERY DAY 90 tablet 3  . CRESTOR 40 MG tablet TAKE ONE TABLET BY MOUTH ONCE DAILY 90 tablet 3  . cyclobenzaprine (FLEXERIL) 10 MG tablet Take 10 mg by mouth 2 (two) times daily.     . fluticasone (FLONASE) 50 MCG/ACT nasal spray Place 2 sprays into both nostrils daily.    . furosemide (LASIX) 20 MG tablet TAKE ONE TABLET BY MOUTH EVERY DAY FOR SWELLING. MAY TAKE 1  ADDITIONAL TABLET AS NEEDED FOR SWELLING/FLUID. 180 tablet 3  . gabapentin (NEURONTIN) 300 MG capsule Take 300 mg by mouth at bedtime.    . isosorbide mononitrate (IMDUR) 30 MG 24 hr tablet Take 1 tablet (30 mg total) by mouth 2 (two) times daily. 180 tablet 3  . KLOR-CON M10 10 MEQ tablet TAKE ONE TABLET BY MOUTH ONCE DAILY 90 tablet 0  . lisinopril (PRINIVIL,ZESTRIL) 20 MG tablet TAKE ONE TABLET BY MOUTH ONCE DAILY 90 tablet 3  . Magnesium 250 MG TABS Take 1 tablet by mouth daily as needed (leg cramps).    . meclizine (ANTIVERT) 25 MG tablet Take 1 tablet (25 mg total) by mouth 2 (two) times daily. (Patient taking differently: Take 25 mg by mouth 3 (three) times daily. ) 30 tablet   . meloxicam (MOBIC) 15 MG tablet Take 1 tablet (15 mg total) by mouth daily.    . metoprolol tartrate (LOPRESSOR) 25 MG tablet TAKE ONE TABLET BY MOUTH TWICE DAILY 180 tablet 3  . Mouthwash Compounding Base (MOUTH WASH-GP PO) Take by mouth.    . Multiple Vitamin (MULTIVITAMIN) tablet Take 1 tablet by mouth daily.    . nitroGLYCERIN (NITROSTAT) 0.4 MG SL tablet Place 1 tablet (0.4 mg total) under the tongue every 5 (five) minutes as needed.    . NON FORMULARY cpap at bedtime    . omeprazole (PRILOSEC) 40 MG capsule Take 1 capsule (40 mg  total) by mouth daily.    [name] PARoxetine (PAXIL) 20 MG tablet Take 20 mg by mouth daily.    . potassium chloride (K-DUR) 10 MEQ tablet Take 1 tablet (10 mEq total) by mouth daily. 90 tablet 3  . rosuvastatin (CRESTOR) 40 MG tablet TAKE ONE TABLET BY MOUTH EVERY DAY 90 tablet 3  . traMADol-acetaminophen (ULTRACET) 37.5-325 MG per tablet Take 1-2 tablets by mouth every 6 (six) hours as needed. 30 tablet   . Biotin w/ Vitamins C & E (HAIR SKIN & NAILS GUMMIES PO) Take 2 tablets by mouth daily.    . metoprolol tartrate (LOPRESSOR) 25 MG tablet TAKE ONE TABLET BY MOUTH TWICE DAILY (Patient not taking: Reported on 01/11/2015) 180 tablet 3  . Na Sulfate-K Sulfate-Mg Sulf (SUPREP BOWEL PREP)  SOLN Take 1 kit by mouth as directed. 1 Bottle 0  . SUPREP BOWEL PREP SOLN Take 1 Container by mouth once.     No current facility-administered medications for this visit.    Allergies as of 01/11/2015 - Review Complete 01/11/2015  Allergen Reaction Noted  . Latex  08/23/2010  . Shellfish allergy    . Sulfonamide derivatives  08/23/2010    Review of Systems: Gen: Denies any fever, chills, fatigue, weakness, malaise ENT: Negative for hoarseness, difficulty swallowing , nasal congestion CV: Denies chest pain, angina, palpitations, syncope, orthopnea, PND, peripheral edema, and claudication. Resp: Denies dyspnea at rest, dyspnea with exercise, cough, sputum, wheezing, coughing up blood, and pleurisy. GI: See HPI GU:  Negative for dysuria, hematuria, urinary incontinence, urinary frequency, nocturnal urination.  Endo: Negative for unusual weight change or sweats Derm: Denies jaundice, rash, itching, or unhealing ulcers.  Psych: Denies depression, anxiety, memory loss, suicidal ideation, hallucinations, paranoia, and confusion. Heme: Denies bruising, bleeding, and enlarged lymph nodes.   Physical Examination:  BP 145/90 mmHg  Pulse 71  Temp(Src) 97.7 F (36.5 C) (Oral)  Ht '5\' 6"'  (1.676 m)  Wt 245 lb (111.131 kg)  BMI 39.56 kg/m2 Body mass index is 39.56 kg/(m^2). No LMP recorded. Patient is postmenopausal. General:   Alert,  Well-developed, well-nourished, pleasant and cooperative in NAD Head:  Normocephalic and atraumatic. Eyes:  Sclera clear, no icterus.   Conjunctiva pink. Mouth:  No deformity or lesions.  Oropharynx pink & moist. Neck:  Supple; no masses or thyromegaly. Heart:  Regular rate and rhythm; no murmurs, clicks, rubs,  or gallops. Abdomen:   Normal bowel sounds.  Soft, nontender and nondistended. No masses, hepatosplenomegaly or hernias noted. No guarding or rebound tenderness.   Msk:  Symmetrical without gross deformities. Normal posture. Pulses:  Normal pulses  noted. Extremities:  Without clubbing or edema. Neurologic:  Alert and  oriented x3;  grossly normal neurologically. Skin:  Intact without significant lesions or rashes. Cervical Nodes:  No significant cervical adenopathy. Psych:  Alert and cooperative. Normal mood and affect.  Imaging Studies: No results found.

## 2015-01-11 NOTE — Assessment & Plan Note (Signed)
Colonoscopy as planned.  

## 2015-01-12 ENCOUNTER — Other Ambulatory Visit: Payer: Self-pay

## 2015-01-12 ENCOUNTER — Other Ambulatory Visit: Payer: Self-pay | Admitting: Urgent Care

## 2015-01-12 DIAGNOSIS — K921 Melena: Secondary | ICD-10-CM

## 2015-01-12 DIAGNOSIS — K219 Gastro-esophageal reflux disease without esophagitis: Secondary | ICD-10-CM

## 2015-01-25 ENCOUNTER — Other Ambulatory Visit: Payer: Self-pay | Admitting: Cardiovascular Disease

## 2015-01-26 ENCOUNTER — Other Ambulatory Visit: Payer: Self-pay

## 2015-01-30 ENCOUNTER — Ambulatory Visit: Admit: 2015-01-30 | Payer: Self-pay | Admitting: Gastroenterology

## 2015-01-30 ENCOUNTER — Encounter: Admission: RE | Payer: Self-pay | Source: Ambulatory Visit

## 2015-01-30 ENCOUNTER — Ambulatory Visit: Admission: RE | Admit: 2015-01-30 | Payer: Medicare Other | Source: Ambulatory Visit | Admitting: Gastroenterology

## 2015-01-30 SURGERY — ESOPHAGOGASTRODUODENOSCOPY (EGD) WITH PROPOFOL
Anesthesia: Choice

## 2015-01-30 SURGERY — COLONOSCOPY WITH PROPOFOL
Anesthesia: Choice

## 2015-02-06 ENCOUNTER — Ambulatory Visit
Admission: RE | Admit: 2015-02-06 | Discharge: 2015-02-06 | Disposition: A | Discharge status: Home / Self Care | Payer: Medicare Other | Source: Ambulatory Visit | Attending: Gastroenterology | Admitting: Gastroenterology

## 2015-02-06 ENCOUNTER — Ambulatory Visit: Payer: Medicare Other | Admitting: Anesthesiology

## 2015-02-06 ENCOUNTER — Encounter
Admission: RE | Disposition: A | Discharge status: Home / Self Care | Payer: Self-pay | Source: Ambulatory Visit | Attending: Gastroenterology

## 2015-02-06 DIAGNOSIS — I739 Peripheral vascular disease, unspecified: Secondary | ICD-10-CM | POA: Diagnosis not present

## 2015-02-06 DIAGNOSIS — F329 Major depressive disorder, single episode, unspecified: Secondary | ICD-10-CM | POA: Insufficient documentation

## 2015-02-06 DIAGNOSIS — Z8541 Personal history of malignant neoplasm of cervix uteri: Secondary | ICD-10-CM | POA: Insufficient documentation

## 2015-02-06 DIAGNOSIS — Z951 Presence of aortocoronary bypass graft: Secondary | ICD-10-CM | POA: Insufficient documentation

## 2015-02-06 DIAGNOSIS — E785 Hyperlipidemia, unspecified: Secondary | ICD-10-CM | POA: Insufficient documentation

## 2015-02-06 DIAGNOSIS — Z7982 Long term (current) use of aspirin: Secondary | ICD-10-CM | POA: Insufficient documentation

## 2015-02-06 DIAGNOSIS — Z79899 Other long term (current) drug therapy: Secondary | ICD-10-CM | POA: Diagnosis not present

## 2015-02-06 DIAGNOSIS — I5022 Chronic systolic (congestive) heart failure: Secondary | ICD-10-CM | POA: Diagnosis not present

## 2015-02-06 DIAGNOSIS — I10 Essential (primary) hypertension: Secondary | ICD-10-CM | POA: Insufficient documentation

## 2015-02-06 DIAGNOSIS — Z7951 Long term (current) use of inhaled steroids: Secondary | ICD-10-CM | POA: Diagnosis not present

## 2015-02-06 DIAGNOSIS — Z955 Presence of coronary angioplasty implant and graft: Secondary | ICD-10-CM | POA: Diagnosis not present

## 2015-02-06 DIAGNOSIS — R12 Heartburn: Secondary | ICD-10-CM | POA: Diagnosis not present

## 2015-02-06 DIAGNOSIS — G709 Myoneural disorder, unspecified: Secondary | ICD-10-CM | POA: Insufficient documentation

## 2015-02-06 DIAGNOSIS — D122 Benign neoplasm of ascending colon: Secondary | ICD-10-CM | POA: Insufficient documentation

## 2015-02-06 DIAGNOSIS — K219 Gastro-esophageal reflux disease without esophagitis: Secondary | ICD-10-CM | POA: Diagnosis not present

## 2015-02-06 DIAGNOSIS — G4730 Sleep apnea, unspecified: Secondary | ICD-10-CM | POA: Diagnosis not present

## 2015-02-06 DIAGNOSIS — J45909 Unspecified asthma, uncomplicated: Secondary | ICD-10-CM | POA: Diagnosis not present

## 2015-02-06 DIAGNOSIS — K921 Melena: Secondary | ICD-10-CM | POA: Insufficient documentation

## 2015-02-06 DIAGNOSIS — I252 Old myocardial infarction: Secondary | ICD-10-CM | POA: Diagnosis not present

## 2015-02-06 DIAGNOSIS — Z791 Long term (current) use of non-steroidal anti-inflammatories (NSAID): Secondary | ICD-10-CM | POA: Insufficient documentation

## 2015-02-06 DIAGNOSIS — I2510 Atherosclerotic heart disease of native coronary artery without angina pectoris: Secondary | ICD-10-CM | POA: Insufficient documentation

## 2015-02-06 DIAGNOSIS — K449 Diaphragmatic hernia without obstruction or gangrene: Secondary | ICD-10-CM

## 2015-02-06 DIAGNOSIS — Z87891 Personal history of nicotine dependence: Secondary | ICD-10-CM | POA: Diagnosis not present

## 2015-02-06 DIAGNOSIS — Z800 Family history of malignant neoplasm of digestive organs: Secondary | ICD-10-CM | POA: Diagnosis not present

## 2015-02-06 DIAGNOSIS — J449 Chronic obstructive pulmonary disease, unspecified: Secondary | ICD-10-CM | POA: Insufficient documentation

## 2015-02-06 DIAGNOSIS — Z7902 Long term (current) use of antithrombotics/antiplatelets: Secondary | ICD-10-CM | POA: Insufficient documentation

## 2015-02-06 DIAGNOSIS — K640 First degree hemorrhoids: Secondary | ICD-10-CM | POA: Insufficient documentation

## 2015-02-06 HISTORY — DX: Sleep apnea, unspecified: G47.30

## 2015-02-06 HISTORY — PX: ESOPHAGOGASTRODUODENOSCOPY (EGD) WITH PROPOFOL: SHX5813

## 2015-02-06 HISTORY — DX: Chronic obstructive pulmonary disease, unspecified: J44.9

## 2015-02-06 HISTORY — PX: COLONOSCOPY WITH PROPOFOL: SHX5780

## 2015-02-06 SURGERY — COLONOSCOPY WITH PROPOFOL
Anesthesia: General

## 2015-02-06 MED ORDER — SODIUM CHLORIDE 0.9 % IV SOLN
INTRAVENOUS | Status: DC
  Administered 2015-02-06: 1000 mL via INTRAVENOUS

## 2015-02-06 MED ORDER — LIDOCAINE HCL (CARDIAC) 20 MG/ML IV SOLN
INTRAVENOUS | Status: DC | PRN
  Administered 2015-02-06: 50 mg via INTRAVENOUS

## 2015-02-06 MED ORDER — PROPOFOL INFUSION 10 MG/ML OPTIME
INTRAVENOUS | Status: DC | PRN
  Administered 2015-02-06: 140 ug/kg/min via INTRAVENOUS

## 2015-02-06 MED ORDER — GLYCOPYRROLATE 0.2 MG/ML IJ SOLN
INTRAMUSCULAR | Status: DC | PRN
  Administered 2015-02-06: 0.1 mg via INTRAVENOUS

## 2015-02-06 MED ORDER — MIDAZOLAM HCL 2 MG/2ML IJ SOLN
INTRAMUSCULAR | Status: DC | PRN
  Administered 2015-02-06: 1 mg via INTRAVENOUS

## 2015-02-06 MED ORDER — FENTANYL CITRATE (PF) 100 MCG/2ML IJ SOLN
INTRAMUSCULAR | Status: DC | PRN
  Administered 2015-02-06: 50 ug via INTRAVENOUS

## 2015-02-06 NOTE — Op Note (Signed)
[facility] Patient Name: [name] Procedure Date: 02/06/2015 7:52 AM MRN: [id number] Account #: [id number] Date of Birth: [birth year] Admit Type: Outpatient Age: 54 Room: [facility] ENDO ROOM 4 Gender: Female Note Status: Finalized Procedure:         Upper GI endoscopy Indications:       Heartburn Providers:         Lucilla Lame, MD Referring MD:      Perrin Maltese, MD (Referring MD) Medicines:         Propofol per Anesthesia Complications:     No immediate complications. Procedure:         Pre-Anesthesia Assessment:                    - Prior to the procedure, a History and Physical was                     performed, and patient medications and allergies were                     reviewed. The patient's tolerance of previous anesthesia                     was also reviewed. The risks and benefits of the procedure                     and the sedation options and risks were discussed with the                     patient. All questions were answered, and informed consent                     was obtained. Prior Anticoagulants: The patient has taken                     no previous anticoagulant or antiplatelet agents. ASA                     Grade Assessment: II - A patient with mild systemic                     disease. After reviewing the risks and benefits, the                     patient was deemed in satisfactory condition to undergo                     the procedure.                    After obtaining informed consent, the endoscope was passed                     under direct vision. Throughout the procedure, the                     patient's blood pressure, pulse, and oxygen saturations                     were monitored continuously. The Olympus GIF-160 endoscope                     (S#. [id number]) was introduced through the mouth, and  advanced to the second part of duodenum. The upper GI                     endoscopy was  accomplished without difficulty. The patient                     tolerated the procedure well. Findings:      A medium-sized hiatus hernia was present.      The stomach was normal.      The examined duodenum was normal. Impression:        - Medium-sized hiatus hernia.                    - Normal stomach.                    - Normal examined duodenum.                    - No specimens collected. Recommendation:    - Continue present medications. Procedure Code(s): --- Professional ---                    [phone], Esophagogastroduodenoscopy, flexible, transoral;                     diagnostic, including collection of specimen(s) by                     brushing or washing, when performed (separate procedure) Diagnosis Code(s): --- Professional ---                    R12, Heartburn                    K44.9, Diaphragmatic hernia without obstruction or gangrene CPT copyright 2014 American Medical Association. All rights reserved. The codes documented in this report are preliminary and upon coder review may  be revised to meet current compliance requirements. Lucilla Lame, MD 02/06/2015 8:26:48 AM This report has been signed electronically. Number of Addenda: 0 Note Initiated On: 02/06/2015 7:52 AM      [facility]

## 2015-02-06 NOTE — Op Note (Signed)
[facility] Patient Name: [name] Procedure Date: 02/06/2015 7:49 AM MRN: [id number] Account #: [id number] Date of Birth: Mar 21, 1961 Admit Type: Outpatient Age: 54 Room: [facility] ENDO ROOM 4 Gender: Female Note Status: Finalized Procedure:         Colonoscopy Indications:       Hematochezia Providers:         Lucilla Lame, MD Referring MD:      Perrin Maltese, MD (Referring MD) Medicines:         Propofol per Anesthesia Complications:     No immediate complications. Procedure:         Pre-Anesthesia Assessment:                    - Prior to the procedure, a History and Physical was                     performed, and patient medications and allergies were                     reviewed. The patient's tolerance of previous anesthesia                     was also reviewed. The risks and benefits of the procedure                     and the sedation options and risks were discussed with the                     patient. All questions were answered, and informed consent                     was obtained. Prior Anticoagulants: The patient has taken                     no previous anticoagulant or antiplatelet agents. ASA                     Grade Assessment: II - A patient with mild systemic                     disease. After reviewing the risks and benefits, the                     patient was deemed in satisfactory condition to undergo                     the procedure.                    After obtaining informed consent, the colonoscope was                     passed under direct vision. Throughout the procedure, the                     patient's blood pressure, pulse, and oxygen saturations                     were monitored continuously. The Colonoscope was                     introduced through the anus and advanced to the the cecum,  identified by appendiceal orifice and ileocecal valve. The                     colonoscopy was  performed without difficulty. The patient                     tolerated the procedure well. The quality of the bowel                     preparation was excellent. Findings:      The perianal and digital rectal examinations were normal.      A 10 mm polyp was found in the ascending colon. The polyp was sessile.       Area was successfully injected with 3 mL saline for a lift polypectomy.       The polyp was removed with a hot snare. Resection and retrieval were       complete. One hemostatic clip was successfully placed. This was done to       prevent bleeding.      Non-bleeding internal hemorrhoids were found during retroflexion. The       hemorrhoids were Grade I (internal hemorrhoids that do not prolapse). Impression:        - One 10 mm polyp in the ascending colon. Resected and                     retrieved. Injected. Clip was placed.                    - Non-bleeding internal hemorrhoids. Recommendation:    - Await pathology results.                    - Repeat colonoscopy in 5 years if polyp adenoma and 10                     years if hyperplastic Procedure Code(s): --- Professional ---                    [phone], Colonoscopy, flexible; with removal of tumor(s),                     polyp(s), or other lesion(s) by snare technique                    [id number], Colonoscopy, flexible; with directed submucosal                     injection(s), any substance Diagnosis Code(s): --- Professional ---                    K92.1, Melena                    D12.2, Benign neoplasm of ascending colon                    K64.0, First degree hemorrhoids CPT copyright 2014 American Medical Association. All rights reserved. The codes documented in this report are preliminary and upon coder review may  be revised to meet current compliance requirements. Lucilla Lame, MD 02/06/2015 8:42:26 AM This report has been signed electronically. Number of Addenda: 0 Note Initiated On: 02/06/2015 7:49 AM Scope  Withdrawal Time: 0 hours 8 minutes 37 seconds  Total Procedure Duration: 0 hours 11 minutes 29 seconds       [facility]

## 2015-02-06 NOTE — Anesthesia Postprocedure Evaluation (Signed)
  Anesthesia Post-op Note  Patient: [name]  Procedure(s) Performed: Procedure(s): COLONOSCOPY WITH PROPOFOL (N/A) ESOPHAGOGASTRODUODENOSCOPY (EGD) WITH PROPOFOL (N/A)  Anesthesia type:General  Patient location: PACU  Post pain: Pain level controlled  Post assessment: Post-op Vital signs reviewed, Patient's Cardiovascular Status Stable, Respiratory Function Stable, Patent Airway and No signs of Nausea or vomiting  Post vital signs: Reviewed and stable  Last Vitals:  Filed Vitals:   02/06/15 0915  BP: 144/87  Pulse: 61  Temp:   Resp: 22    Level of consciousness: awake, alert  and patient cooperative  Complications: No apparent anesthesia complications

## 2015-02-06 NOTE — H&P (Signed)
[facility]  [address] [place], [place] [address] Phone: [phone] Fax : [phone]  Primary Care Physician:  Perrin Maltese, MD Primary Gastroenterologist:  Dr. Allen Norris  Pre-Procedure History & Physical: HPI:  [name] is a 54 y.o. female is here for an endoscopy and colonoscopy.   Past Medical History  Diagnosis Date  . Coronary artery disease     PCI of left circumflex 2003, PCI of the LAD 2012 with a bare-metal stent (2.5 x 8 mm);   s/p CABG 4/12:  L-LAD, S-Dx, S-OM, S-RCA (Dr. Prescott Gum)  . Hyperlipidemia   . Hypertension   . PAD (peripheral artery disease)     s/p Right SFA atherectomy and PTA 01/15/11, normal ABIs 01/2011  . Brachial neuritis or radiculitis NOS   . Systolic CHF, chronic     mild: echo 08/2010 - mildly reduced EF 40-45%, mild diffuse hypokinesis  . Anemia   . Cocaine abuse, in remission     clean x 24 years  . Arthritis   . Blood in stool   . History of cervical cancer     s/p cryotherapy  . Depression   . Generalized headaches     frequent  . GERD (gastroesophageal reflux disease)   . Seasonal allergies   . Cardiac arrhythmia   . Heart murmur   . History of hepatitis B     from eating undercooked liver  . History of kidney stones   . Urine incontinence   . History of drug abuse     cocaine, marijuana, clean since 1989  . Smoking history     quit 07/2010  . Chronic pain   . Cervical neck pain with evidence of disc disease     C5/6 disease, MRI done late 2012 - no records available  . Bronchitis   . Bacterial vaginitis   . History of MI (myocardial infarction)   . Thyroid disease   . Hematochezia   . Family history of colon cancer   . Polyp of colon   . Congestive heart failure   . Sleep apnea   . COPD (chronic obstructive pulmonary disease)     Past Surgical History  Procedure Laterality Date  . Coronary artery bypass graft  2012    Dr [name]  . Cholecystectomy  1980  . Coronary stent placement  2003     S/P MI  . Coronary stent placement  2007    [place]  . Femoral artery stent  10/2010    right sided (Dr. Burt Knack)  . Coronary angioplasty      w/ stent placement x2  . Colonoscopy  2008    3 polyps    Prior to Admission medications   Medication Sig Start Date End Date Taking? Authorizing Provider  metoprolol tartrate (LOPRESSOR) 25 MG tablet TAKE ONE TABLET BY MOUTH TWICE DAILY 01/20/14  Yes Minna Merritts, MD  albuterol (PROVENTIL HFA;VENTOLIN HFA) 108 (90 BASE) MCG/ACT inhaler Inhale 2 puffs into the lungs every 6 (six) hours as needed for wheezing or shortness of breath. 01/20/14   Minna Merritts, MD  aspirin 81 MG tablet Take 162 mg by mouth 2 (two) times daily.    Historical Provider, MD  Biotin w/ Vitamins C & E (HAIR SKIN & NAILS GUMMIES PO) Take 2 tablets by mouth daily.    Historical Provider, MD  budesonide-formoterol (SYMBICORT) 160-4.5 MCG/ACT inhaler Inhale 2 puffs into the lungs 2 (two) times daily.    Historical Provider, MD  cetirizine (ZYRTEC) 10 MG chewable tablet Chew 1 tablet (10 mg total) by mouth daily. 01/20/14   Minna Merritts, MD  clopidogrel (PLAVIX) 75 MG tablet TAKE ONE TABLET BY MOUTH EVERY DAY 01/20/14   Minna Merritts, MD  CRESTOR 40 MG tablet TAKE ONE TABLET BY MOUTH ONCE DAILY 08/28/14   Minna Merritts, MD  cyclobenzaprine (FLEXERIL) 10 MG tablet Take 10 mg by mouth 2 (two) times daily.  10/27/14   Historical Provider, MD  fluticasone (FLONASE) 50 MCG/ACT nasal spray Place 2 sprays into both nostrils daily.    Historical Provider, MD  furosemide (LASIX) 20 MG tablet TAKE ONE TABLET BY MOUTH EVERY DAY FOR SWELLING. MAY TAKE 1 ADDITIONAL TABLET AS NEEDED FOR SWELLING/FLUID. 06/05/14   Minna Merritts, MD  gabapentin (NEURONTIN) 300 MG capsule Take 300 mg by mouth at bedtime.    Historical Provider, MD  isosorbide mononitrate (IMDUR) 30 MG 24 hr tablet Take 1 tablet (30 mg total) by mouth 2 (two) times daily. 01/20/14   Minna Merritts, MD  isosorbide  mononitrate (IMDUR) 30 MG 24 hr tablet TAKE ONE TABLET BY MOUTH TWICE DAILY 01/26/15   Minna Merritts, MD  KLOR-CON M10 10 MEQ tablet TAKE ONE TABLET BY MOUTH ONCE DAILY 10/26/14   Minna Merritts, MD  lisinopril (PRINIVIL,ZESTRIL) 20 MG tablet TAKE ONE TABLET BY MOUTH ONCE DAILY 05/29/14   Minna Merritts, MD  Magnesium 250 MG TABS Take 1 tablet by mouth daily as needed (leg cramps).    Historical Provider, MD  meclizine (ANTIVERT) 25 MG tablet Take 1 tablet (25 mg total) by mouth 2 (two) times daily. Patient taking differently: Take 25 mg by mouth 3 (three) times daily.  01/20/14   Minna Merritts, MD  meloxicam (MOBIC) 15 MG tablet Take 1 tablet (15 mg total) by mouth daily. 01/20/14   Minna Merritts, MD  metoprolol tartrate (LOPRESSOR) 25 MG tablet TAKE ONE TABLET BY MOUTH TWICE DAILY Patient not taking: Reported on 01/11/2015 11/23/14   Minna Merritts, MD  Mouthwash Compounding Base (MOUTH WASH-GP PO) Take by mouth.    Historical Provider, MD  Multiple Vitamin (MULTIVITAMIN) tablet Take 1 tablet by mouth daily. 01/20/14   Minna Merritts, MD  Na Sulfate-K Sulfate-Mg Sulf (SUPREP BOWEL PREP) SOLN Take 1 kit by mouth as directed. 01/11/15   Andria Meuse, NP  nitroGLYCERIN (NITROSTAT) 0.4 MG SL tablet Place 1 tablet (0.4 mg total) under the tongue every 5 (five) minutes as needed. 01/20/14   Minna Merritts, MD  NON FORMULARY cpap at bedtime    Historical Provider, MD  omeprazole (PRILOSEC) 40 MG capsule Take 1 capsule (40 mg total) by mouth daily. 01/20/14   Minna Merritts, MD  PARoxetine (PAXIL) 20 MG tablet Take 20 mg by mouth daily.    Historical Provider, MD  potassium chloride (K-DUR) 10 MEQ tablet Take 1 tablet (10 mEq total) by mouth daily. 01/20/14   Minna Merritts, MD  rosuvastatin (CRESTOR) 40 MG tablet TAKE ONE TABLET BY MOUTH EVERY DAY 01/20/14   Minna Merritts, MD  SUPREP BOWEL PREP SOLN Take 1 Container by mouth once. 10/11/14   Historical Provider, MD  traMADol-acetaminophen  (ULTRACET) 37.5-325 MG per tablet Take 1-2 tablets by mouth every 6 (six) hours as needed. 01/20/14   Minna Merritts, MD    Allergies as of 01/26/2015 - Review Complete 01/11/2015  Allergen Reaction Noted  . Latex  08/23/2010  . Shellfish  allergy    . Sulfonamide derivatives  08/23/2010    Family History  Problem Relation Age of Onset  . Hypertension Father   . Heart failure Father   . Diabetes Father   . Colon cancer Father 72  . Glaucoma Father   . Breast cancer Mother 85    breast cancer, late 5's  . Coronary artery disease Neg Hx   . Stroke Neg Hx   . Brain cancer Sister     History   Social History  . Marital Status: Single    Spouse Name: N/A  . Number of Children: N/A  . Years of Education: N/A   Occupational History  . Not on file.   Social History Main Topics  . Smoking status: Former Smoker -- 1.00 packs/day for 38 years    Quit date: 08/12/2010  . Smokeless tobacco: Never Used  . Alcohol Use: Yes  . Drug Use: Yes    Special: Cocaine     Comment: Remote Hx (crack cocaine and marijuana)  . Sexual Activity: Not on file   Other Topics Concern  . Not on file   Social History Narrative   Caffeine: 1 cup coffee/day   Lives alone, no pets   Occupation: industrial work, prior [employer], in process of getting disability, initially denied   Edu: 11th grade   Activity: no regular exercise   Diet: good water, vegetables daily, low salt diet    Review of Systems: See HPI, otherwise negative ROS  Physical Exam: BP 135/95 mmHg  Pulse 78  Temp(Src) 97.1 F (36.2 C) (Tympanic)  Resp 17  Ht _0  (1.676 m)  Wt 243 lb (110.224 kg)  BMI 39.24 kg/m2  SpO2 99% General:   Alert,  pleasant and cooperative in NAD Head:  Normocephalic and atraumatic. Neck:  Supple; no masses or thyromegaly. Lungs:  Clear throughout to auscultation.    Heart:  Regular rate and rhythm. Abdomen:  Soft, nontender and nondistended. Normal bowel sounds, without guarding, and without  rebound.   Neurologic:  Alert and  oriented x4;  grossly normal neurologically.  Impression/Plan: [name] is here for an endoscopy and colonoscopy to be performed for hematochzia and GD  Risks, benefits, limitations, and alternatives regarding  endoscopy and colonoscopy have been reviewed with the patient.  Questions have been answered.  All parties agreeable.   Ollen Bowl, MD  02/06/2015, 8:22 AM

## 2015-02-06 NOTE — Transfer of Care (Signed)
Immediate Anesthesia Transfer of Care Note  Patient: [name]  Procedure(s) Performed: Procedure(s): COLONOSCOPY WITH PROPOFOL (N/A) ESOPHAGOGASTRODUODENOSCOPY (EGD) WITH PROPOFOL (N/A)  Patient Location: PACU  Anesthesia Type:General  Level of Consciousness: awake and sedated  Airway & Oxygen Therapy: Patient Spontanous Breathing and Patient connected to face mask oxygen  Post-op Assessment: Report given to RN and Post -op Vital signs reviewed and stable  Post vital signs: Reviewed and stable  Last Vitals:  Filed Vitals:   02/06/15 0749  BP: 135/95  Pulse: 78  Temp: 36.2 C  Resp: 17    Complications: No apparent anesthesia complications

## 2015-02-06 NOTE — Anesthesia Preprocedure Evaluation (Signed)
Anesthesia Evaluation  Patient identified by MRN, date of birth, ID band Patient awake    Reviewed: Allergy & Precautions, H&P , NPO status , Patient's Chart, lab work & pertinent test results, reviewed documented beta blocker date and time   Airway Mallampati: II  TM Distance: >3 FB Neck ROM: full    Dental no notable dental hx. (+) Teeth Intact   Pulmonary shortness of breath and with exertion, asthma , sleep apnea , COPDformer smoker,  breath sounds clear to auscultation  Pulmonary exam normal       Cardiovascular hypertension, + CAD, + Past MI, + Peripheral Vascular Disease and +CHF Normal cardiovascular exam+ dysrhythmias + Valvular Problems/Murmurs Rhythm:regular Rate:Normal     Neuro/Psych  Headaches, PSYCHIATRIC DISORDERS  Neuromuscular disease    GI/Hepatic Neg liver ROS, GERD-  Medicated and Controlled,  Endo/Other  negative endocrine ROS  Renal/GU negative Renal ROS  negative genitourinary   Musculoskeletal   Abdominal   Peds  Hematology   Anesthesia Other Findings Past Medical History:   Coronary artery disease                                        Comment:PCI of left circumflex 2003, PCI of the LAD               2012 with a bare-metal stent (2.5 x 8 mm);                 s/p CABG 4/12:  L-LAD, S-Dx, S-OM, S-RCA (Dr.               Prescott Gum)   Hyperlipidemia                                               Hypertension                                                 PAD (peripheral artery disease)                                Comment:s/p Right SFA atherectomy and PTA 01/15/11,               normal ABIs 01/2011   Brachial neuritis or radiculitis NOS                         Systolic CHF, chronic                                          Comment:mild: echo 08/2010 - mildly reduced EF 40-45%,               mild diffuse hypokinesis   Anemia  Cocaine abuse, in  remission                                    Comment:clean x 24 years   Arthritis                                                    Blood in stool                                               History of cervical cancer                                     Comment:s/p cryotherapy   Depression                                                   Generalized headaches                                          Comment:frequent   GERD (gastroesophageal reflux disease)                       Seasonal allergies                                           Cardiac arrhythmia                                           Heart murmur                                                 History of hepatitis B                                         Comment:from eating undercooked liver   History of kidney stones                                     Urine incontinence                                           History of drug abuse  Comment:cocaine, marijuana, clean since 1989   Smoking history                                                Comment:quit 07/2010   Chronic pain                                                 Cervical neck pain with evidence of disc disea*                Comment:C5/6 disease, MRI done late 2012 - no records               available   Bronchitis                                                   Bacterial vaginitis                                          History of MI (myocardial infarction)                        Thyroid disease                                              Hematochezia                                                 Family history of colon cancer                               Polyp of colon                                               Congestive heart failure                                     Sleep apnea                                                  COPD (chronic obstructive pulmonary disease)                  Reproductive/Obstetrics negative OB ROS  Anesthesia Physical Anesthesia Plan  ASA: III  Anesthesia Plan: General   Post-op Pain Management:    Induction:   Airway Management Planned:   Additional Equipment:   Intra-op Plan:   Post-operative Plan:   Informed Consent: I have reviewed the patients History and Physical, chart, labs and discussed the procedure including the risks, benefits and alternatives for the proposed anesthesia with the patient or authorized representative who has indicated his/her understanding and acceptance.   Dental Advisory Given  Plan Discussed with: Anesthesiologist, CRNA and Surgeon  Anesthesia Plan Comments:         Anesthesia Quick Evaluation

## 2015-02-06 NOTE — Anesthesia Procedure Notes (Signed)
Performed by: Vaughan Sine Pre-anesthesia Checklist: Patient identified, Emergency Drugs available, Suction available, Patient being monitored and Timeout performed Patient Re-evaluated:Patient Re-evaluated prior to inductionOxygen Delivery Method: Simple face mask Preoxygenation: Pre-oxygenation with 100% oxygen Intubation Type: IV induction Airway Equipment and Method: Bite block Placement Confirmation: positive ETCO2

## 2015-02-07 ENCOUNTER — Encounter: Payer: Self-pay | Admitting: Gastroenterology

## 2015-02-08 LAB — SURGICAL PATHOLOGY

## 2015-02-20 ENCOUNTER — Encounter: Payer: Self-pay | Admitting: Gastroenterology

## 2015-02-20 ENCOUNTER — Telehealth: Payer: Self-pay | Admitting: Gastroenterology

## 2015-02-20 NOTE — Telephone Encounter (Signed)
Patient called asking about path results, I did let patient know Dr.Wohl has been out of town and is returning tom.

## 2015-03-19 ENCOUNTER — Other Ambulatory Visit: Payer: Self-pay | Admitting: Internal Medicine

## 2015-03-19 DIAGNOSIS — Z1231 Encounter for screening mammogram for malignant neoplasm of breast: Secondary | ICD-10-CM

## 2015-04-03 ENCOUNTER — Telehealth: Payer: Self-pay

## 2015-04-03 NOTE — Telephone Encounter (Signed)
Spoke w/ pt.   She reports that she is sched to have 3 teeth extracted on 04/10/15 at [facility] w/ [place] [place].  Fax# [phone].

## 2015-04-03 NOTE — Telephone Encounter (Signed)
[facility] for dental procedure. Hold Plavix 5 days prior. Continue aspirin 81 mg throughout. Restart Plavix when able per treating MD.

## 2015-04-03 NOTE — Telephone Encounter (Signed)
Routed to [facility].

## 2015-04-03 NOTE — Telephone Encounter (Signed)
Pt needs clearance for dental surgery on 9/13. Please call.

## 2015-04-28 ENCOUNTER — Other Ambulatory Visit: Payer: Self-pay | Admitting: Cardiovascular Disease

## 2015-04-30 ENCOUNTER — Encounter: Payer: Self-pay | Admitting: Cardiovascular Disease

## 2015-04-30 ENCOUNTER — Other Ambulatory Visit: Payer: Self-pay | Admitting: *Deleted

## 2015-04-30 ENCOUNTER — Ambulatory Visit (INDEPENDENT_AMBULATORY_CARE_PROVIDER_SITE_OTHER): Payer: Medicare Other | Admitting: Cardiovascular Disease

## 2015-04-30 VITALS — BP 95/60 | HR 75 | Ht 66.0 in | Wt 239.5 lb

## 2015-04-30 DIAGNOSIS — I951 Orthostatic hypotension: Secondary | ICD-10-CM | POA: Diagnosis not present

## 2015-04-30 DIAGNOSIS — I739 Peripheral vascular disease, unspecified: Secondary | ICD-10-CM

## 2015-04-30 DIAGNOSIS — I2510 Atherosclerotic heart disease of native coronary artery without angina pectoris: Secondary | ICD-10-CM

## 2015-04-30 DIAGNOSIS — I159 Secondary hypertension, unspecified: Secondary | ICD-10-CM | POA: Diagnosis not present

## 2015-04-30 DIAGNOSIS — Z951 Presence of aortocoronary bypass graft: Secondary | ICD-10-CM | POA: Diagnosis not present

## 2015-04-30 DIAGNOSIS — I5022 Chronic systolic (congestive) heart failure: Secondary | ICD-10-CM | POA: Diagnosis not present

## 2015-04-30 DIAGNOSIS — E785 Hyperlipidemia, unspecified: Secondary | ICD-10-CM

## 2015-04-30 HISTORY — DX: Orthostatic hypotension: I95.1

## 2015-04-30 MED ORDER — METOPROLOL TARTRATE 25 MG PO TABS: ORAL_TABLET | ORAL | Status: DC

## 2015-04-30 NOTE — Assessment & Plan Note (Signed)
Currently with no symptoms of angina. No further workup at this time. Continue current medication regimen. We would decrease her isosorbide down to 30 mg daily given her low blood pressure

## 2015-04-30 NOTE — Assessment & Plan Note (Signed)
Blood pressure low on today's visit. Medication changes as above

## 2015-04-30 NOTE — Assessment & Plan Note (Signed)
Blood pressure low on today's visit. Recommended she decrease her isosorbide down to 30 mg daily

## 2015-04-30 NOTE — Patient Instructions (Addendum)
You are doing well. Blood pressure is low today  Please take isosorbide one a day, Take other isosorbide only for angina  Please call us if you have new issues that need to be addressed before your next appt.  Your physician wants you to follow-up in: 6 months.  You will receive a reminder letter in the mail two months in advance. If you don't receive a letter, please call our office to schedule the follow-up appointment.

## 2015-04-30 NOTE — Assessment & Plan Note (Signed)
She has done well since her bypass surgery. No significant medication changes apart from what is mentioned above

## 2015-04-30 NOTE — Assessment & Plan Note (Signed)
Cholesterol is at goal on the current lipid regimen. No changes to the medications were made.  

## 2015-04-30 NOTE — Assessment & Plan Note (Signed)
Recommended continued smoking cessation, aggressive cholesterol management.

## 2015-04-30 NOTE — Progress Notes (Signed)
Patient ID: [name], female    DOB: [birth year], 54 y.o.   MRN: [id number]  HPI Comments: 54 yo woman with a long smoking history,  h/o CAD s/p  NSTEMI at [facility]  1/12, with BMS to pLAD (80% stenosis), residual severe stenosis of the RCA which is a nondominant small vessel, With continued chest pain, repeat cardiac catheterization showing distal left main stenosis involving a high grade ostial LAD stenosis and circumflex disease approximately 90%, 90% diagonal disease, 80% nondominant RCA disease with ejection fraction 45%, who underwent bypass surgery x4 with a LIMA to the LAD, vein graft to the diagonal, vein graft to the marginal and vein graft to the RCA,  Also with peripheral vascular disease, S/p atherectomy and PTA of her right SFA on 01/15/11,  who presents for routine followup of her coronary artery disease  In follow-up, she reports that she is doing well. Reports having problems with GERD. Takes omeprazole but has breakthrough symptoms Occasional anginal symptoms, otherwise relatively well-controlled Denies orthostatic symptoms. Does have vertigo at times. Takes meclizine when necessary Takes Lasix when necessary for shortness of breath She continues to use her CPAP, inhalers. No significant chest pain with exertion  EKG on today's visit shows normal sinus rhythm with rate 75 bpm, diffuse T-wave abnormality in the anterior precordial leads, inferior leads  Other past medical history Prior  outpatient stress test  July 2013 showed significant breast attenuation artifact, inferior wall artifact from GI uptake. Overall no significant ischemia. History of chronic left arm and upper left chest  pain and was seen pain clinic. Prior diagnosis of carpal tunnel syndrome  occasional left breast pain radiating to her mediastinum.    Previously seen by ear nose throat for vestibular testing as a cause of her dizziness.  EGD and colonoscopy which by her report showed hiatal hernia. Prior problems  with swallowing  She has had MRI of the neck showing cervical disc disease  Lab work shows total cholesterol 151, LDL 79, HDL 52, hemoglobin A1c 6.1  Echocardiogram in the hospital showed normal LV systolic function ejection fraction 50-55%, mild MR, mild to moderate TR with right ventricular systolic pressures 08-67  Allergies  Allergen Reactions  . Latex     Rash   . Shellfish Allergy     Hard shellfish/swelling in throat  . Sulfonamide Derivatives     Swelling in throat.    Outpatient Encounter Prescriptions as of 04/30/2015  Medication Sig  . albuterol (PROVENTIL HFA;VENTOLIN HFA) 108 (90 BASE) MCG/ACT inhaler Inhale 2 puffs into the lungs every 6 (six) hours as needed for wheezing or shortness of breath.  [name] aspirin 81 MG tablet Take 162 mg by mouth 2 (two) times daily.  . Biotin w/ Vitamins C & E (HAIR SKIN & NAILS GUMMIES PO) Take 2 tablets by mouth daily.  . budesonide-formoterol (SYMBICORT) 160-4.5 MCG/ACT inhaler Inhale 2 puffs into the lungs 2 (two) times daily.  . cetirizine (ZYRTEC) 10 MG chewable tablet Chew 1 tablet (10 mg total) by mouth daily.  . clopidogrel (PLAVIX) 75 MG tablet TAKE ONE TABLET BY MOUTH EVERY DAY  . CRESTOR 40 MG tablet TAKE ONE TABLET BY MOUTH ONCE DAILY  . cyclobenzaprine (FLEXERIL) 10 MG tablet Take 10 mg by mouth 2 (two) times daily.   . fluticasone (FLONASE) 50 MCG/ACT nasal spray Place 2 sprays into both nostrils daily.  . furosemide (LASIX) 20 MG tablet TAKE ONE TABLET BY MOUTH EVERY DAY FOR SWELLING. MAY TAKE 1 ADDITIONAL TABLET AS  NEEDED FOR SWELLING/FLUID.  [name] gabapentin (NEURONTIN) 300 MG capsule Take 300 mg by mouth at bedtime.  . isosorbide mononitrate (IMDUR) 30 MG 24 hr tablet Take 1 tablet (30 mg total) by mouth 2 (two) times daily.  . isosorbide mononitrate (IMDUR) 30 MG 24 hr tablet TAKE ONE TABLET BY MOUTH TWICE DAILY  . lisinopril (PRINIVIL,ZESTRIL) 20 MG tablet TAKE ONE TABLET BY MOUTH ONCE DAILY  . Magnesium 250 MG TABS Take 1  tablet by mouth daily as needed (leg cramps).  . meclizine (ANTIVERT) 25 MG tablet Take 1 tablet (25 mg total) by mouth 2 (two) times daily. (Patient taking differently: Take 25 mg by mouth 3 (three) times daily. )  . meloxicam (MOBIC) 15 MG tablet Take 1 tablet (15 mg total) by mouth daily.  . metoprolol tartrate (LOPRESSOR) 25 MG tablet TAKE ONE TABLET BY MOUTH TWICE DAILY  . Mouthwash Compounding Base (MOUTH WASH-GP PO) Take by mouth.  . Multiple Vitamin (MULTIVITAMIN) tablet Take 1 tablet by mouth daily.  . nitroGLYCERIN (NITROSTAT) 0.4 MG SL tablet Place 1 tablet (0.4 mg total) under the tongue every 5 (five) minutes as needed.  . NON FORMULARY cpap at bedtime  . omeprazole (PRILOSEC) 40 MG capsule Take 1 capsule (40 mg total) by mouth daily.  [name] PARoxetine (PAXIL) 20 MG tablet Take 20 mg by mouth daily.  . potassium chloride (K-DUR) 10 MEQ tablet Take 1 tablet (10 mEq total) by mouth daily.  . traMADol-acetaminophen (ULTRACET) 37.5-325 MG per tablet Take 1-2 tablets by mouth every 6 (six) hours as needed.  . [DISCONTINUED] KLOR-CON M10 10 MEQ tablet TAKE ONE TABLET BY MOUTH ONCE DAILY (Patient not taking: Reported on 04/30/2015)  . [DISCONTINUED] metoprolol tartrate (LOPRESSOR) 25 MG tablet TAKE ONE TABLET BY MOUTH TWICE DAILY (Patient not taking: Reported on 01/11/2015)  . [DISCONTINUED] Na Sulfate-K Sulfate-Mg Sulf (SUPREP BOWEL PREP) SOLN Take 1 kit by mouth as directed. (Patient not taking: Reported on 04/30/2015)  . [DISCONTINUED] rosuvastatin (CRESTOR) 40 MG tablet TAKE ONE TABLET BY MOUTH EVERY DAY (Patient not taking: Reported on 04/30/2015)  . [DISCONTINUED] SUPREP BOWEL PREP SOLN Take 1 Container by mouth once.   No facility-administered encounter medications on file as of 04/30/2015.    Past Medical History  Diagnosis Date  . Coronary artery disease     PCI of left circumflex 2003, PCI of the LAD 2012 with a bare-metal stent (2.5 x 8 mm);   s/p CABG 4/12:  L-LAD, S-Dx, S-OM, S-RCA  (Dr. Prescott Gum)  . Hyperlipidemia   . Hypertension   . PAD (peripheral artery disease) (HCC)     s/p Right SFA atherectomy and PTA 01/15/11, normal ABIs 01/2011  . Brachial neuritis or radiculitis NOS   . Systolic CHF, chronic (HCC)     mild: echo 08/2010 - mildly reduced EF 40-45%, mild diffuse hypokinesis  . Anemia   . Cocaine abuse, in remission     clean x 24 years  . Arthritis   . Blood in stool   . History of cervical cancer     s/p cryotherapy  . Depression   . Generalized headaches     frequent  . GERD (gastroesophageal reflux disease)   . Seasonal allergies   . Cardiac arrhythmia   . Heart murmur   . History of hepatitis B     from eating undercooked liver  . History of kidney stones   . Urine incontinence   . History of drug abuse     cocaine,  marijuana, clean since 1989  . Smoking history     quit 07/2010  . Chronic pain   . Cervical neck pain with evidence of disc disease     C5/6 disease, MRI done late 2012 - no records available  . Bronchitis   . Bacterial vaginitis   . History of MI (myocardial infarction)   . Thyroid disease   . Hematochezia   . Family history of colon cancer   . Polyp of colon   . Congestive heart failure ([place])   . Sleep apnea   . COPD (chronic obstructive pulmonary disease) [facility])     Past Surgical History  Procedure Laterality Date  . Coronary artery bypass graft  2012    Dr [name]  . Cholecystectomy  1980  . Coronary stent placement  2003    S/P MI  . Coronary stent placement  2007    [place]  . Femoral artery stent  10/2010    right sided (Dr. Burt Knack)  . Coronary angioplasty      w/ stent placement x2  . Colonoscopy  2008    3 polyps  . Colonoscopy with propofol N/A 02/06/2015    Procedure: COLONOSCOPY WITH PROPOFOL;  Surgeon: Lucilla Lame, MD;  Location: [facility] ENDOSCOPY;  Service: Endoscopy;  Laterality: N/A;  . Esophagogastroduodenoscopy (egd) with propofol N/A 02/06/2015    Procedure: ESOPHAGOGASTRODUODENOSCOPY (EGD) WITH  PROPOFOL;  Surgeon: Lucilla Lame, MD;  Location: [facility] ENDOSCOPY;  Service: Endoscopy;  Laterality: N/A;    Social History  reports that she quit smoking about 4 years ago. She has never used smokeless tobacco. She reports that she does not drink alcohol or use illicit drugs.  Family History family history includes Brain cancer in her sister; Breast cancer (age of onset: 66) in her mother; Colon cancer (age of onset: 71) in her father; Diabetes in her father; Glaucoma in her father; Heart failure in her father; Hypertension in her father. There is no history of Coronary artery disease or Stroke.  Review of Systems  Constitutional: Negative.   Eyes: Negative.   Respiratory: Negative.   Cardiovascular: Negative.   Gastrointestinal: Negative.   Musculoskeletal: Positive for myalgias, back pain, joint swelling and arthralgias.  Skin: Negative.   Neurological: Negative.   Hematological: Negative.   Psychiatric/Behavioral: Negative.   All other systems reviewed and are negative.  BP 95/60 mmHg  Pulse 75  Ht '5\' 6"'  (1.676 m)  Wt 239 lb 8 oz (108.636 kg)  BMI 38.67 kg/m2  Physical Exam  Constitutional: She is oriented to person, place, and time. She appears well-developed and well-nourished.  HENT:  Head: Normocephalic.  Nose: Nose normal.  Mouth/Throat: Oropharynx is clear and moist.  Eyes: Conjunctivae are normal. Pupils are equal, round, and reactive to light.  Neck: Normal range of motion. Neck supple. No JVD present.  Cardiovascular: Normal rate, regular rhythm, S1 normal, S2 normal, normal heart sounds and intact distal pulses.  Exam reveals no gallop and no friction rub.   No murmur heard. Pulmonary/Chest: Effort normal and breath sounds normal. No respiratory distress. She has no wheezes. She has no rales. She exhibits no tenderness.  Abdominal: Soft. Bowel sounds are normal. She exhibits no distension. There is no tenderness.  Musculoskeletal: Normal range of motion. She  exhibits no edema or tenderness.  Lymphadenopathy:    She has no cervical adenopathy.  Neurological: She is alert and oriented to person, place, and time. Coordination normal.  Skin: Skin is warm and dry. No rash noted. No  erythema.  Psychiatric: She has a normal mood and affect. Her behavior is normal. Judgment and thought content normal.    Assessment and Plan  Nursing note and vitals reviewed.

## 2015-05-08 LAB — HM PAP SMEAR: HM Pap smear: NEGATIVE

## 2015-05-11 ENCOUNTER — Encounter: Payer: Self-pay | Admitting: Internal Medicine

## 2015-05-18 ENCOUNTER — Encounter: Payer: Self-pay | Admitting: Internal Medicine

## 2015-05-24 ENCOUNTER — Ambulatory Visit
Admission: RE | Admit: 2015-05-24 | Discharge: 2015-05-24 | Disposition: A | Discharge status: Home / Self Care | Payer: Medicare Other | Source: Ambulatory Visit | Attending: Internal Medicine | Admitting: Internal Medicine

## 2015-05-24 ENCOUNTER — Other Ambulatory Visit: Payer: Self-pay | Admitting: Internal Medicine

## 2015-05-24 DIAGNOSIS — Z1231 Encounter for screening mammogram for malignant neoplasm of breast: Secondary | ICD-10-CM

## 2015-05-29 ENCOUNTER — Other Ambulatory Visit: Payer: Self-pay | Admitting: Internal Medicine

## 2015-05-29 DIAGNOSIS — N63 Unspecified lump in unspecified breast: Secondary | ICD-10-CM

## 2015-06-01 ENCOUNTER — Other Ambulatory Visit: Payer: Self-pay | Admitting: Cardiovascular Disease

## 2015-06-18 ENCOUNTER — Ambulatory Visit: Payer: Medicare Other | Admitting: Gastroenterology

## 2015-06-19 ENCOUNTER — Ambulatory Visit
Admission: RE | Admit: 2015-06-19 | Discharge: 2015-06-19 | Disposition: A | Discharge status: Home / Self Care | Payer: Medicare Other | Source: Ambulatory Visit | Attending: Internal Medicine | Admitting: Internal Medicine

## 2015-06-19 ENCOUNTER — Other Ambulatory Visit: Payer: Self-pay | Admitting: Internal Medicine

## 2015-06-19 DIAGNOSIS — M549 Dorsalgia, unspecified: Secondary | ICD-10-CM

## 2015-06-19 DIAGNOSIS — I509 Heart failure, unspecified: Secondary | ICD-10-CM | POA: Insufficient documentation

## 2015-06-19 DIAGNOSIS — R928 Other abnormal and inconclusive findings on diagnostic imaging of breast: Secondary | ICD-10-CM | POA: Diagnosis not present

## 2015-06-19 DIAGNOSIS — Z800 Family history of malignant neoplasm of digestive organs: Secondary | ICD-10-CM | POA: Insufficient documentation

## 2015-06-19 DIAGNOSIS — K635 Polyp of colon: Secondary | ICD-10-CM | POA: Insufficient documentation

## 2015-06-19 DIAGNOSIS — N63 Unspecified lump in unspecified breast: Secondary | ICD-10-CM

## 2015-06-19 HISTORY — DX: Heart failure, unspecified: I50.9

## 2015-06-19 HISTORY — DX: Dorsalgia, unspecified: M54.9

## 2015-06-19 HISTORY — DX: Polyp of colon: K63.5

## 2015-06-19 HISTORY — DX: Family history of malignant neoplasm of digestive organs: Z80.0

## 2015-06-20 ENCOUNTER — Other Ambulatory Visit: Payer: Self-pay

## 2015-06-20 ENCOUNTER — Ambulatory Visit: Payer: Medicare Other | Admitting: Gastroenterology

## 2015-06-28 ENCOUNTER — Other Ambulatory Visit: Payer: Self-pay | Admitting: Internal Medicine

## 2015-06-28 DIAGNOSIS — K4090 Unilateral inguinal hernia, without obstruction or gangrene, not specified as recurrent: Secondary | ICD-10-CM

## 2015-07-03 ENCOUNTER — Ambulatory Visit: Payer: Medicare Other

## 2015-07-09 ENCOUNTER — Other Ambulatory Visit (HOSPITAL_COMMUNITY): Payer: Self-pay | Admitting: Interventional Radiology

## 2015-07-09 ENCOUNTER — Other Ambulatory Visit (HOSPITAL_COMMUNITY): Payer: Self-pay | Admitting: Internal Medicine

## 2015-07-09 ENCOUNTER — Ambulatory Visit
Admission: RE | Admit: 2015-07-09 | Discharge: 2015-07-09 | Disposition: A | Discharge status: Home / Self Care | Payer: Medicare Other | Source: Ambulatory Visit | Attending: Internal Medicine | Admitting: Internal Medicine

## 2015-07-09 DIAGNOSIS — K4090 Unilateral inguinal hernia, without obstruction or gangrene, not specified as recurrent: Secondary | ICD-10-CM

## 2015-07-09 DIAGNOSIS — R1031 Right lower quadrant pain: Secondary | ICD-10-CM | POA: Insufficient documentation

## 2015-07-19 ENCOUNTER — Ambulatory Visit: Payer: Medicare Other | Admitting: Gastroenterology

## 2015-08-06 ENCOUNTER — Other Ambulatory Visit: Payer: Self-pay | Admitting: Cardiovascular Disease

## 2015-08-30 ENCOUNTER — Other Ambulatory Visit: Payer: Self-pay | Admitting: Cardiovascular Disease

## 2015-08-30 ENCOUNTER — Telehealth: Payer: Self-pay

## 2015-08-30 NOTE — Telephone Encounter (Signed)
Spoke w/ pharmacist, advised her that generic is fine.  

## 2015-08-30 NOTE — Telephone Encounter (Signed)
Needs ok to give generic Crestor. Please call and advise

## 2015-08-30 NOTE — Telephone Encounter (Signed)
Requested Prescriptions   Pending Prescriptions Disp Refills  . clopidogrel (PLAVIX) 75 MG tablet [Pharmacy Med Name: CLOPIDOGREL 75MG     TAB] 90 tablet 3    Sig: TAKE ONE TABLET BY MOUTH ONCE DAILY

## 2015-09-04 ENCOUNTER — Encounter: Payer: Self-pay | Admitting: Gastroenterology

## 2015-09-04 ENCOUNTER — Other Ambulatory Visit
Admission: RE | Admit: 2015-09-04 | Discharge: 2015-09-04 | Disposition: A | Discharge status: Home / Self Care | Payer: Medicare Other | Source: Ambulatory Visit | Attending: Gastroenterology | Admitting: Gastroenterology

## 2015-09-04 ENCOUNTER — Ambulatory Visit (INDEPENDENT_AMBULATORY_CARE_PROVIDER_SITE_OTHER): Payer: Medicare Other | Admitting: Gastroenterology

## 2015-09-04 ENCOUNTER — Other Ambulatory Visit: Payer: Self-pay

## 2015-09-04 VITALS — BP 87/54 | HR 92 | Temp 98.2°F | Ht 64.0 in | Wt 240.8 lb

## 2015-09-04 DIAGNOSIS — R197 Diarrhea, unspecified: Secondary | ICD-10-CM | POA: Diagnosis present

## 2015-09-04 LAB — C DIFFICILE QUICK SCREEN W PCR REFLEX
C Diff antigen: NEGATIVE
C Diff interpretation: NEGATIVE
C Diff toxin: NEGATIVE

## 2015-09-04 LAB — GASTROINTESTINAL PANEL BY PCR, STOOL (REPLACES STOOL CULTURE)
ADENOVIRUS F40/41: NOT DETECTED
ASTROVIRUS: NOT DETECTED
CAMPYLOBACTER SPECIES: NOT DETECTED
Cryptosporidium: NOT DETECTED
Cyclospora cayetanensis: NOT DETECTED
E. coli O157: NOT DETECTED
ENTEROAGGREGATIVE E COLI (EAEC): NOT DETECTED
ENTEROPATHOGENIC E COLI (EPEC): NOT DETECTED
ENTEROTOXIGENIC E COLI (ETEC): NOT DETECTED
Entamoeba histolytica: NOT DETECTED
GIARDIA LAMBLIA: NOT DETECTED
NOROVIRUS GI/GII: NOT DETECTED
PLESIMONAS SHIGELLOIDES: NOT DETECTED
Rotavirus A: NOT DETECTED
SHIGA LIKE TOXIN PRODUCING E COLI (STEC): NOT DETECTED
SHIGELLA/ENTEROINVASIVE E COLI (EIEC): NOT DETECTED
Salmonella species: NOT DETECTED
Sapovirus (I, II, IV, and V): NOT DETECTED
Vibrio cholerae: NOT DETECTED
Vibrio species: NOT DETECTED
Yersinia enterocolitica: NOT DETECTED

## 2015-09-04 NOTE — Progress Notes (Signed)
Primary Care Physician: Perrin Maltese, MD  Primary Gastroenterologist:  Dr. Lucilla Lame  Chief Complaint  Patient presents with  . change in bowel habits    HPI: [name] is a 55 y.o. female here  For weight loss and diarrhea. The patient had an EGD and colonoscopy back in June without any findings that would explain her present symptoms. The patient states that she was woken up with the diarrhea back in December with a few bowel movements during the night and this lasted for approximately 3 days. She does not wake up to have any bowel movements anymore. The patient also reports that she has back pain when she moves her bowels and has had loose stools. She also states that she feels so bad she hasn't been eating much. The patient weight 145 pounds when she saw me 6 months ago and still weighs 140 pounds today. The patient reports that she has some loose bowel movements approximate 4-6 times a day but no blood is seen in the stools. There is no report of any black stools. She does report that she feels nauseated because she says that the heartburn has continued despite her taking the PPI she was prescribed.  Current Outpatient Prescriptions  Medication Sig Dispense Refill  . albuterol (PROVENTIL HFA;VENTOLIN HFA) 108 (90 BASE) MCG/ACT inhaler Inhale 2 puffs into the lungs every 6 (six) hours as needed for wheezing or shortness of breath. 1 Inhaler 2  . aspirin 81 MG tablet Take 162 mg by mouth 2 (two) times daily.    . budesonide-formoterol (SYMBICORT) 160-4.5 MCG/ACT inhaler Inhale 2 puffs into the lungs 2 (two) times daily.    . cetirizine (ZYRTEC) 10 MG chewable tablet Chew 1 tablet (10 mg total) by mouth daily.    . clopidogrel (PLAVIX) 75 MG tablet TAKE ONE TABLET BY MOUTH EVERY DAY 90 tablet 3  . clopidogrel (PLAVIX) 75 MG tablet TAKE ONE TABLET BY MOUTH ONCE DAILY 90 tablet 3  . CRESTOR 40 MG tablet TAKE ONE TABLET BY MOUTH ONCE DAILY 90 tablet 3  . cyclobenzaprine (FLEXERIL) 10  MG tablet Take 10 mg by mouth 2 (two) times daily.     . furosemide (LASIX) 20 MG tablet TAKE ONE TABLET BY MOUTH EVERY DAY FOR SWELLING. MAY TAKE 1 ADDITIONAL TABLET AS NEEDED FOR SWELLING/FLUID. 180 tablet 3  . gabapentin (NEURONTIN) 300 MG capsule Take 300 mg by mouth at bedtime.    . isosorbide mononitrate (IMDUR) 30 MG 24 hr tablet TAKE ONE TABLET BY MOUTH TWICE DAILY 180 tablet 3  . KLOR-CON M10 10 MEQ tablet TAKE ONE TABLET BY MOUTH ONCE DAILY 90 tablet 3  . lisinopril (PRINIVIL,ZESTRIL) 20 MG tablet TAKE ONE TABLET BY MOUTH ONCE DAILY 90 tablet 3  . meclizine (ANTIVERT) 25 MG tablet Take 1 tablet (25 mg total) by mouth 2 (two) times daily. (Patient taking differently: Take 25 mg by mouth 3 (three) times daily. ) 30 tablet   . meloxicam (MOBIC) 15 MG tablet Take 1 tablet (15 mg total) by mouth daily.    . metFORMIN (GLUCOPHAGE) 500 MG tablet     . metoprolol tartrate (LOPRESSOR) 25 MG tablet TAKE ONE TABLET BY MOUTH TWICE DAILY 180 tablet 3  . Multiple Vitamin (MULTIVITAMIN) tablet Take 1 tablet by mouth daily.    . nitroGLYCERIN (NITROSTAT) 0.4 MG SL tablet Place 1 tablet (0.4 mg total) under the tongue every 5 (five) minutes as needed.    . NON FORMULARY cpap at bedtime    .  omeprazole (PRILOSEC) 40 MG capsule Take 1 capsule (40 mg total) by mouth daily.    [name] PARoxetine (PAXIL) 20 MG tablet Take 20 mg by mouth daily.    . potassium chloride (K-DUR) 10 MEQ tablet Take 1 tablet (10 mEq total) by mouth daily. 90 tablet 3  . traMADol-acetaminophen (ULTRACET) 37.5-325 MG per tablet Take 1-2 tablets by mouth every 6 (six) hours as needed. 30 tablet   . Biotin w/ Vitamins C & E (HAIR SKIN & NAILS GUMMIES PO) Take 2 tablets by mouth daily. Reported on 09/04/2015    . esomeprazole (NEXIUM) 40 MG capsule Reported on 09/04/2015    . fluticasone (FLONASE) 50 MCG/ACT nasal spray Place 2 sprays into both nostrils daily. Reported on 09/04/2015    . Magnesium 250 MG TABS Take 1 tablet by mouth daily as needed  (leg cramps). Reported on 09/04/2015    . Mouthwash Compounding Base (MOUTH WASH-GP PO) Take by mouth. Reported on 09/04/2015    . pantoprazole (PROTONIX) 40 MG tablet Reported on 09/04/2015     No current facility-administered medications for this visit.    Allergies as of 09/04/2015 - Review Complete 09/04/2015  Allergen Reaction Noted  . Latex  08/23/2010  . Shellfish allergy    . Sulfonamide derivatives  08/23/2010    ROS:  General: Negative for anorexia, weight loss, fever, chills, fatigue, weakness. ENT: Negative for hoarseness, difficulty swallowing , nasal congestion. CV: Negative for chest pain, angina, palpitations, dyspnea on exertion, peripheral edema.  Respiratory: Negative for dyspnea at rest, dyspnea on exertion, cough, sputum, wheezing.  GI: See history of present illness. GU:  Negative for dysuria, hematuria, urinary incontinence, urinary frequency, nocturnal urination.  Endo: Negative for unusual weight change.    Physical Examination:   BP 87/54 mmHg  Pulse 92  Temp(Src) 98.2 F (36.8 C) (Oral)  Ht 5\' 4"  (1.626 m)  Wt 240 lb 12.8 oz (109.226 kg)  BMI 41.31 kg/m2  General: Well-nourished, well-developed in no acute distress.  Eyes: No icterus. Conjunctivae pink. Mouth: Oropharyngeal mucosa moist and pink , no lesions erythema or exudate. Lungs: Clear to auscultation bilaterally. Non-labored. Heart: Regular rate and rhythm, no murmurs rubs or gallops.  Scar from previous heart surgery seen. Abdomen: Bowel sounds are normal, nontender, nondistended, no hepatosplenomegaly or masses, no abdominal bruits or hernia , no rebound or guarding.   Extremities: No lower extremity edema. No clubbing or deformities. Neuro: Alert and oriented x 3.  Grossly intact. Skin: Warm and dry, no jaundice.   Psych: Alert and cooperative, normal mood and affect.  Labs:    Imaging Studies: No results found.  Assessment and Plan:   [name] is a 55 y.o. y/o female comes in  with a history of weight loss , anorexia and diarrhea. The patient also reports that she has heartburn not being helped with her medication presently. Despite a report of weight loss and anorexia the patient has only lost 5 pounds in the last  7 months. Her diarrhea will be checked with stool cultures. The patient will also start Imodium as needed. She will be given samples of Dexilant to see if this helps her heartburn better than when she's taking at the present time. The patient has been explained the plan and agrees with it.   Note: This dictation was prepared with Dragon dictation along with smaller phrase technology. Any transcriptional errors that result from this process are unintentional.

## 2015-09-06 ENCOUNTER — Telehealth: Payer: Self-pay

## 2015-09-06 NOTE — Telephone Encounter (Signed)
LVM letting pt know her lab results were normal.

## 2015-09-06 NOTE — Telephone Encounter (Signed)
-----   Message from Lucilla Lame, MD sent at 09/05/2015  8:13 AM EST ----- Let the patient know that all of the stool tests did not show any infection.

## 2015-09-14 ENCOUNTER — Other Ambulatory Visit: Payer: Self-pay | Admitting: Orthopedic Surgery

## 2015-09-14 DIAGNOSIS — M5136 Other intervertebral disc degeneration, lumbar region: Secondary | ICD-10-CM

## 2015-09-14 DIAGNOSIS — M4726 Other spondylosis with radiculopathy, lumbar region: Secondary | ICD-10-CM

## 2015-09-25 ENCOUNTER — Other Ambulatory Visit: Payer: Self-pay | Admitting: Cardiovascular Disease

## 2015-10-03 ENCOUNTER — Ambulatory Visit
Admission: RE | Admit: 2015-10-03 | Discharge: 2015-10-03 | Disposition: A | Discharge status: Home / Self Care | Payer: Medicare Other | Source: Ambulatory Visit | Attending: Orthopedic Surgery | Admitting: Orthopedic Surgery

## 2015-10-03 DIAGNOSIS — M4726 Other spondylosis with radiculopathy, lumbar region: Secondary | ICD-10-CM | POA: Insufficient documentation

## 2015-10-03 DIAGNOSIS — M1288 Other specific arthropathies, not elsewhere classified, other specified site: Secondary | ICD-10-CM | POA: Insufficient documentation

## 2015-10-03 DIAGNOSIS — M5136 Other intervertebral disc degeneration, lumbar region: Secondary | ICD-10-CM | POA: Diagnosis not present

## 2015-10-03 DIAGNOSIS — M4806 Spinal stenosis, lumbar region: Secondary | ICD-10-CM | POA: Insufficient documentation

## 2015-10-03 DIAGNOSIS — M7138 Other bursal cyst, other site: Secondary | ICD-10-CM | POA: Insufficient documentation

## 2015-10-12 ENCOUNTER — Other Ambulatory Visit: Payer: Self-pay | Admitting: Orthopedic Surgery

## 2015-10-12 DIAGNOSIS — G8929 Other chronic pain: Secondary | ICD-10-CM

## 2015-10-12 DIAGNOSIS — M5442 Lumbago with sciatica, left side: Principal | ICD-10-CM

## 2015-10-12 DIAGNOSIS — M5441 Lumbago with sciatica, right side: Principal | ICD-10-CM

## 2015-10-20 ENCOUNTER — Emergency Department
Admission: EM | Admit: 2015-10-20 | Discharge: 2015-10-20 | Disposition: A | Discharge status: Home / Self Care | Payer: Medicare Other | Attending: Emergency Medicine | Admitting: Emergency Medicine

## 2015-10-20 ENCOUNTER — Emergency Department: Payer: Medicare Other

## 2015-10-20 ENCOUNTER — Encounter: Payer: Self-pay | Admitting: Emergency Medicine

## 2015-10-20 DIAGNOSIS — F1410 Cocaine abuse, uncomplicated: Secondary | ICD-10-CM | POA: Diagnosis not present

## 2015-10-20 DIAGNOSIS — R1031 Right lower quadrant pain: Secondary | ICD-10-CM | POA: Diagnosis present

## 2015-10-20 DIAGNOSIS — Z87891 Personal history of nicotine dependence: Secondary | ICD-10-CM | POA: Insufficient documentation

## 2015-10-20 DIAGNOSIS — I2510 Atherosclerotic heart disease of native coronary artery without angina pectoris: Secondary | ICD-10-CM | POA: Diagnosis not present

## 2015-10-20 DIAGNOSIS — G8929 Other chronic pain: Secondary | ICD-10-CM | POA: Insufficient documentation

## 2015-10-20 DIAGNOSIS — F329 Major depressive disorder, single episode, unspecified: Secondary | ICD-10-CM | POA: Diagnosis not present

## 2015-10-20 DIAGNOSIS — Z951 Presence of aortocoronary bypass graft: Secondary | ICD-10-CM | POA: Insufficient documentation

## 2015-10-20 DIAGNOSIS — I951 Orthostatic hypotension: Secondary | ICD-10-CM | POA: Diagnosis not present

## 2015-10-20 DIAGNOSIS — J449 Chronic obstructive pulmonary disease, unspecified: Secondary | ICD-10-CM | POA: Diagnosis not present

## 2015-10-20 DIAGNOSIS — M5090 Cervical disc disorder, unspecified, unspecified cervical region: Secondary | ICD-10-CM | POA: Diagnosis not present

## 2015-10-20 DIAGNOSIS — Z7982 Long term (current) use of aspirin: Secondary | ICD-10-CM | POA: Insufficient documentation

## 2015-10-20 DIAGNOSIS — Z7951 Long term (current) use of inhaled steroids: Secondary | ICD-10-CM | POA: Insufficient documentation

## 2015-10-20 DIAGNOSIS — Z79899 Other long term (current) drug therapy: Secondary | ICD-10-CM | POA: Diagnosis not present

## 2015-10-20 DIAGNOSIS — E079 Disorder of thyroid, unspecified: Secondary | ICD-10-CM | POA: Insufficient documentation

## 2015-10-20 DIAGNOSIS — I110 Hypertensive heart disease with heart failure: Secondary | ICD-10-CM | POA: Insufficient documentation

## 2015-10-20 DIAGNOSIS — I252 Old myocardial infarction: Secondary | ICD-10-CM | POA: Insufficient documentation

## 2015-10-20 DIAGNOSIS — E785 Hyperlipidemia, unspecified: Secondary | ICD-10-CM | POA: Insufficient documentation

## 2015-10-20 DIAGNOSIS — I5022 Chronic systolic (congestive) heart failure: Secondary | ICD-10-CM | POA: Diagnosis not present

## 2015-10-20 DIAGNOSIS — K219 Gastro-esophageal reflux disease without esophagitis: Secondary | ICD-10-CM | POA: Insufficient documentation

## 2015-10-20 DIAGNOSIS — M1990 Unspecified osteoarthritis, unspecified site: Secondary | ICD-10-CM | POA: Insufficient documentation

## 2015-10-20 DIAGNOSIS — Z8541 Personal history of malignant neoplasm of cervix uteri: Secondary | ICD-10-CM | POA: Diagnosis not present

## 2015-10-20 DIAGNOSIS — I739 Peripheral vascular disease, unspecified: Secondary | ICD-10-CM | POA: Diagnosis not present

## 2015-10-20 DIAGNOSIS — Z7902 Long term (current) use of antithrombotics/antiplatelets: Secondary | ICD-10-CM | POA: Diagnosis not present

## 2015-10-20 DIAGNOSIS — F1990 Other psychoactive substance use, unspecified, uncomplicated: Secondary | ICD-10-CM | POA: Insufficient documentation

## 2015-10-20 DIAGNOSIS — R109 Unspecified abdominal pain: Secondary | ICD-10-CM

## 2015-10-20 DIAGNOSIS — Z91013 Allergy to seafood: Secondary | ICD-10-CM | POA: Insufficient documentation

## 2015-10-20 DIAGNOSIS — Z91040 Latex allergy status: Secondary | ICD-10-CM | POA: Insufficient documentation

## 2015-10-20 LAB — URINALYSIS COMPLETE WITH MICROSCOPIC (ARMC ONLY)
BACTERIA UA: NONE SEEN
BILIRUBIN URINE: NEGATIVE
GLUCOSE, UA: NEGATIVE mg/dL
Hgb urine dipstick: NEGATIVE
Ketones, ur: NEGATIVE mg/dL
LEUKOCYTES UA: NEGATIVE
NITRITE: NEGATIVE
PH: 5 (ref 5.0–8.0)
PROTEIN: NEGATIVE mg/dL
SPECIFIC GRAVITY, URINE: 1.008 (ref 1.005–1.030)

## 2015-10-20 LAB — CBC
HCT: 38 % (ref 35.0–47.0)
Hemoglobin: 12.7 g/dL (ref 12.0–16.0)
MCH: 30.7 pg (ref 26.0–34.0)
MCHC: 33.5 g/dL (ref 32.0–36.0)
MCV: 91.8 fL (ref 80.0–100.0)
PLATELETS: 203 10*3/uL (ref 150–440)
RBC: 4.14 MIL/uL (ref 3.80–5.20)
RDW: 13.9 % (ref 11.5–14.5)
WBC: 7.1 10*3/uL (ref 3.6–11.0)

## 2015-10-20 LAB — COMPREHENSIVE METABOLIC PANEL
ALK PHOS: 70 U/L (ref 38–126)
ALT: 19 U/L (ref 14–54)
ANION GAP: 6 (ref 5–15)
AST: 18 U/L (ref 15–41)
Albumin: 4.3 g/dL (ref 3.5–5.0)
BILIRUBIN TOTAL: 0.3 mg/dL (ref 0.3–1.2)
BUN: 20 mg/dL (ref 6–20)
CALCIUM: 9.6 mg/dL (ref 8.9–10.3)
CO2: 28 mmol/L (ref 22–32)
CREATININE: 1.07 mg/dL — AB (ref 0.44–1.00)
Chloride: 104 mmol/L (ref 101–111)
GFR, EST NON AFRICAN AMERICAN: 57 mL/min — AB (ref >60)
Glucose, Bld: 118 mg/dL — ABNORMAL HIGH (ref 65–99)
Potassium: 4.1 mmol/L (ref 3.5–5.1)
Sodium: 138 mmol/L (ref 135–145)
TOTAL PROTEIN: 8 g/dL (ref 6.5–8.1)

## 2015-10-20 LAB — LIPASE, BLOOD: Lipase: 25 U/L (ref 11–51)

## 2015-10-20 MED ORDER — TRAMADOL HCL 50 MG PO TABS: 50.0000 mg | ORAL_TABLET | Freq: Four times a day (QID) | ORAL | Status: DC | PRN

## 2015-10-20 MED ORDER — HYDROCODONE-ACETAMINOPHEN 5-325 MG PO TABS
1.0000 | ORAL_TABLET | Freq: Once | ORAL | Status: AC
  Administered 2015-10-20: 1 via ORAL
  Filled 2015-10-20: qty 1

## 2015-10-20 NOTE — Discharge Instructions (Signed)
Abdominal Pain, Adult °Many things can cause abdominal pain. Usually, abdominal pain is not caused by a disease and will improve without treatment. It can often be observed and treated at home. Your health care provider will do a physical exam and possibly order blood tests and X-rays to help determine the seriousness of your pain. However, in many cases, more time must pass before a clear cause of the pain can be found. Before that point, your health care provider may not know if you need more testing or further treatment. °HOME CARE INSTRUCTIONS °Monitor your abdominal pain for any changes. The following actions may help to alleviate any discomfort you are experiencing: °· Only take over-the-counter or prescription medicines as directed by your health care provider. °· Do not take laxatives unless directed to do so by your health care provider. °· Try a clear liquid diet (broth, tea, or water) as directed by your health care provider. Slowly move to a bland diet as tolerated. °SEEK MEDICAL CARE IF: °· You have unexplained abdominal pain. °· You have abdominal pain associated with nausea or diarrhea. °· You have pain when you urinate or have a bowel movement. °· You experience abdominal pain that wakes you in the night. °· You have abdominal pain that is worsened or improved by eating food. °· You have abdominal pain that is worsened with eating fatty foods. °· You have a fever. °SEEK IMMEDIATE MEDICAL CARE IF: °· Your pain does not go away within 2 hours. °· You keep throwing up (vomiting). °· Your pain is felt only in portions of the abdomen, such as the right side or the left lower portion of the abdomen. °· You pass bloody or black tarry stools. °MAKE SURE YOU: °· Understand these instructions. °· Will watch your condition. °· Will get help right away if you are not doing well or get worse. °  °This information is not intended to replace advice given to you by your health care provider. Make sure you discuss  any questions you have with your health care provider. °  °Document Released: 04/23/2005 Document Revised: 04/04/2015 Document Reviewed: 03/23/2013 °Elsevier Interactive Patient Education ©2016 [employer] ° ° °Please return immediately if condition worsens. Please contact her primary physician or the physician you were given for referral. If you have any specialist physicians involved in her treatment and plan please also contact them. Thank you for using [facility] emergency [facility]. ° °

## 2015-10-20 NOTE — ED Notes (Signed)
Pt to ed with c/o right flank and right lower abd pain that started on Sunday.  Pt states sharp, constant, pulling pain.  Pt denies n/v/d.

## 2015-10-20 NOTE — ED Provider Notes (Signed)
Time Seen: Approximately 1500 I have reviewed the triage notes  Chief Complaint: Abdominal Pain and Flank Pain   History of Present Illness: [name] is a 55 y.o. female who presents with some right-sided flank and right lower quadrant abdominal pain that started on Sunday. Patient states she's had the pain pretty much all week and seems to be playing and worse with movement. She denies any fever, she states that she has some mild pain with the end of urination. She states it hurts to move and occasionally when she coughs but she does not have a fever or persistent cough at home. She states that she's been taking a muscle relaxant for pain due to chronic low back pain which is been recently worked up with an MRI. Patient denies any difficulty with bowels. She denies any melena or hematochezia. Past Medical History  Diagnosis Date  . Coronary artery disease     PCI of left circumflex 2003, PCI of the LAD 2012 with a bare-metal stent (2.5 x 8 mm);   s/p CABG 4/12:  L-LAD, S-Dx, S-OM, S-RCA (Dr. Prescott Gum)  . Hyperlipidemia   . Hypertension   . PAD (peripheral artery disease) (HCC)     s/p Right SFA atherectomy and PTA 01/15/11, normal ABIs 01/2011  . Brachial neuritis or radiculitis NOS   . Systolic CHF, chronic (HCC)     mild: echo 08/2010 - mildly reduced EF 40-45%, mild diffuse hypokinesis  . Anemia   . Cocaine abuse, in remission     clean x 24 years  . Arthritis   . Blood in stool   . History of cervical cancer     s/p cryotherapy  . Depression   . Generalized headaches     frequent  . GERD (gastroesophageal reflux disease)   . Seasonal allergies   . Cardiac arrhythmia   . Heart murmur   . History of hepatitis B     from eating undercooked liver  . History of kidney stones   . Urine incontinence   . History of drug abuse     cocaine, marijuana, clean since 1989  . Smoking history     quit 07/2010  . Chronic pain   . Cervical neck pain with evidence of disc disease      C5/6 disease, MRI done late 2012 - no records available  . Bronchitis   . Bacterial vaginitis   . History of MI (myocardial infarction)   . Thyroid disease   . Hematochezia   . Family history of colon cancer   . Polyp of colon   . Congestive heart failure ([place])   . Sleep apnea   . COPD (chronic obstructive pulmonary disease) ([place])   . Cancer [facility])     cervical    Patient Active Problem List   Diagnosis Date Noted  . Back ache 06/19/2015  . Colon polyp 06/19/2015  . CCF (congestive cardiac failure) ([place]) 06/19/2015  . Family history of colon cancer 06/19/2015  . Orthostatic hypotension 04/30/2015  . Hematochezia 01/11/2015  . History of colonic polyps 01/11/2015  . Atypical chest pain 01/20/2014  . Diarrhea 06/03/2013  . Bronchitis 01/03/2013  . Right knee pain 12/20/2011  . HTN (hypertension) 12/20/2011  . Systolic CHF, chronic ([place])   . History of cervical cancer   . Depression   . GERD (gastroesophageal reflux disease)   . Seasonal allergies   . History of drug abuse   . Neck pain 05/07/2011  . PAD (peripheral  artery disease) ([place]) 02/03/2011  . 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 Smoking history 12/16/2010  . Hyperlipidemia 08/23/2010  . Coronary atherosclerosis of native coronary artery 08/23/2010  . CLAUDICATION 08/23/2010  . CAROTID BRUIT 08/23/2010  . SHORTNESS OF BREATH 08/23/2010    Past Surgical History  Procedure Laterality Date  . Coronary artery bypass graft  2012    Dr [name]  . Cholecystectomy  1980  . Coronary stent placement  2003    S/P MI  . Coronary stent placement  2007    [place]  . Femoral artery stent  10/2010    right sided (Dr. Burt Knack)  . Coronary angioplasty      w/ stent placement x2  . Colonoscopy  2008    3 polyps  . Colonoscopy with propofol N/A 02/06/2015    Procedure: COLONOSCOPY WITH PROPOFOL;  Surgeon: Lucilla Lame, MD;  Location: [facility] ENDOSCOPY;  Service: Endoscopy;  Laterality: N/A;  . Esophagogastroduodenoscopy (egd) with  propofol N/A 02/06/2015    Procedure: ESOPHAGOGASTRODUODENOSCOPY (EGD) WITH PROPOFOL;  Surgeon: Lucilla Lame, MD;  Location: [facility] ENDOSCOPY;  Service: Endoscopy;  Laterality: N/A;    Past Surgical History  Procedure Laterality Date  . Coronary artery bypass graft  2012    Dr [name]  . Cholecystectomy  1980  . Coronary stent placement  2003    S/P MI  . Coronary stent placement  2007    [place]  . Femoral artery stent  10/2010    right sided (Dr. Burt Knack)  . Coronary angioplasty      w/ stent placement x2  . Colonoscopy  2008    3 polyps  . Colonoscopy with propofol N/A 02/06/2015    Procedure: COLONOSCOPY WITH PROPOFOL;  Surgeon: Lucilla Lame, MD;  Location: [facility] ENDOSCOPY;  Service: Endoscopy;  Laterality: N/A;  . Esophagogastroduodenoscopy (egd) with propofol N/A 02/06/2015    Procedure: ESOPHAGOGASTRODUODENOSCOPY (EGD) WITH PROPOFOL;  Surgeon: Lucilla Lame, MD;  Location: [facility] ENDOSCOPY;  Service: Endoscopy;  Laterality: N/A;    Current Outpatient Rx  Name  Route  Sig  Dispense  Refill  . albuterol (PROVENTIL HFA;VENTOLIN HFA) 108 (90 BASE) MCG/ACT inhaler   Inhalation   Inhale 2 puffs into the lungs every 6 (six) hours as needed for wheezing or shortness of breath.   1 Inhaler   2   . aspirin 81 MG tablet   Oral   Take 162 mg by mouth 2 (two) times daily.         . Biotin w/ Vitamins C & E (HAIR SKIN & NAILS GUMMIES PO)   Oral   Take 2 tablets by mouth daily. Reported on 09/04/2015         . budesonide-formoterol (SYMBICORT) 160-4.5 MCG/ACT inhaler   Inhalation   Inhale 2 puffs into the lungs 2 (two) times daily.         . cetirizine (ZYRTEC) 10 MG chewable tablet   Oral   Chew 1 tablet (10 mg total) by mouth daily.         . clopidogrel (PLAVIX) 75 MG tablet      TAKE ONE TABLET BY MOUTH EVERY DAY   90 tablet   3   . clopidogrel (PLAVIX) 75 MG tablet      TAKE ONE TABLET BY MOUTH ONCE DAILY   90 tablet   3   . CRESTOR 40 MG tablet      TAKE ONE TABLET  BY MOUTH ONCE DAILY   90 tablet   3  Dispense as written.   . cyclobenzaprine (FLEXERIL) 10 MG tablet   Oral   Take 10 mg by mouth 2 (two) times daily.          [name] esomeprazole (NEXIUM) 40 MG capsule      Reported on 09/04/2015         . fluticasone (FLONASE) 50 MCG/ACT nasal spray   Each Nare   Place 2 sprays into both nostrils daily. Reported on 09/04/2015         . furosemide (LASIX) 20 MG tablet      TAKE ONE TABLET BY MOUTH ONCE DAILY FOR SWELLING. MAY TAKE ONE ADDITIONAL TABLET AS NEEDED FOR SWELLING/FLUID   180 tablet   3   . gabapentin (NEURONTIN) 300 MG capsule   Oral   Take 300 mg by mouth at bedtime.         . isosorbide mononitrate (IMDUR) 30 MG 24 hr tablet      TAKE ONE TABLET BY MOUTH TWICE DAILY   180 tablet   3   . KLOR-CON M10 10 MEQ tablet      TAKE ONE TABLET BY MOUTH ONCE DAILY   90 tablet   3   . lisinopril (PRINIVIL,ZESTRIL) 20 MG tablet      TAKE ONE TABLET BY MOUTH ONCE DAILY   90 tablet   3   . Magnesium 250 MG TABS   Oral   Take 1 tablet by mouth daily as needed (leg cramps). Reported on 09/04/2015         . meclizine (ANTIVERT) 25 MG tablet   Oral   Take 1 tablet (25 mg total) by mouth 2 (two) times daily. Patient taking differently: Take 25 mg by mouth 3 (three) times daily.    30 tablet      . meloxicam (MOBIC) 15 MG tablet   Oral   Take 1 tablet (15 mg total) by mouth daily.         . metFORMIN (GLUCOPHAGE) 500 MG tablet               . metoprolol tartrate (LOPRESSOR) 25 MG tablet      TAKE ONE TABLET BY MOUTH TWICE DAILY   180 tablet   3   . Mouthwash Compounding Base (MOUTH WASH-GP PO)   Oral   Take by mouth. Reported on 09/04/2015         . Multiple Vitamin (MULTIVITAMIN) tablet   Oral   Take 1 tablet by mouth daily.         . nitroGLYCERIN (NITROSTAT) 0.4 MG SL tablet   Sublingual   Place 1 tablet (0.4 mg total) under the tongue every 5 (five) minutes as needed.         . NON  FORMULARY      cpap at bedtime         . omeprazole (PRILOSEC) 40 MG capsule   Oral   Take 1 capsule (40 mg total) by mouth daily.         . pantoprazole (PROTONIX) 40 MG tablet      Reported on 09/04/2015         . PARoxetine (PAXIL) 20 MG tablet   Oral   Take 20 mg by mouth daily.         . potassium chloride (K-DUR) 10 MEQ tablet   Oral   Take 1 tablet (10 mEq total) by mouth daily.   90 tablet   3   . traMADol (ULTRAM)  50 MG tablet   Oral   Take 1 tablet (50 mg total) by mouth every 6 (six) hours as needed.   20 tablet   0     Allergies:  Latex; Shellfish allergy; and Sulfonamide derivatives  Family History: Family History  Problem Relation Age of Onset  . Hypertension Father   . Heart failure Father   . Diabetes Father   . Colon cancer Father 107  . Glaucoma Father   . Breast cancer Mother 27    breast cancer, late 29's  . Coronary artery disease Neg Hx   . Stroke Neg Hx   . Brain cancer Sister     Social History: Social History  Substance Use Topics  . Smoking status: Former Smoker -- 1.00 packs/day for 38 years    Quit date: 08/12/2010  . Smokeless tobacco: Never Used  . Alcohol Use: No     Review of Systems:   10 point review of systems was performed and was otherwise negative:  Constitutional: No fever Eyes: No visual disturbances ENT: No sore throat, ear pain Cardiac: No chest pain Respiratory: No shortness of breath, wheezing, or stridor Abdomen: Abdominal pain is right lower quadrant radiating occasionally to the right flank Endocrine: No weight loss, No night sweats Extremities: No peripheral edema, cyanosis Skin: No rashes, easy bruising Neurologic: No focal weakness, trouble with speech or swollowing Urologic: No dysuria, Hematuria, or urinary frequency   Physical Exam:  ED Triage Vitals  Enc Vitals Group     BP 10/20/15 1120 91/67 mmHg     Pulse Rate 10/20/15 1120 83     Resp 10/20/15 1120 20     Temp 10/20/15 1120  97.5 F (36.4 C)     Temp Source 10/20/15 1120 Oral     SpO2 10/20/15 1120 100 %     Weight 10/20/15 1120 230 lb (104.327 kg)     Height 10/20/15 1120 5\' 6"  (1.676 m)     Head Cir --      Peak Flow --      Pain Score 10/20/15 1121 10     Pain Loc --      Pain Edu? --      Excl. in [place]? --     General: Awake , Alert , and Oriented times 3; GCS 15 Head: Normal cephalic , atraumatic Eyes: Pupils equal , round, reactive to light Nose/Throat: No nasal drainage, patent upper airway without erythema or exudate.  Neck: Supple, Full range of motion, No anterior adenopathy or palpable thyroid masses Lungs: Clear to ascultation without wheezes , rhonchi, or rales Heart: Regular rate, regular rhythm without murmurs , gallops , or rubs Abdomen: Soft, non tender without rebound, guarding , or rigidity; bowel sounds positive and symmetric in all 4 quadrants. No organomegaly .     No focal tenderness over McBurney's point   Extremities: 2 plus symmetric pulses. No edema, clubbing or cyanosis Neurologic: normal ambulation, Motor symmetric without deficits, sensory intact Skin: warm, dry, no rashes   Labs:   All laboratory work was reviewed including any pertinent negatives or positives listed below:  Labs Reviewed  COMPREHENSIVE METABOLIC PANEL - Abnormal; Notable for the following:    Glucose, Bld 118 (*)    Creatinine, Ser 1.07 (*)    GFR calc non Af Amer 57 (*)    All other components within normal limits  URINALYSIS COMPLETEWITH MICROSCOPIC ([facility] ONLY) - Abnormal; Notable for the following:    Color, Urine STRAW (*)  APPearance CLEAR (*)    Squamous Epithelial / LPF 0-5 (*)    All other components within normal limits  LIPASE, BLOOD  CBC  Laboratory work was reviewed and showed no clinically significant abnormalities.    Radiology:   EXAM: CT ABDOMEN AND PELVIS WITHOUT CONTRAST  TECHNIQUE: Multidetector CT imaging of the abdomen and pelvis was performed following the  standard protocol without IV contrast.  COMPARISON: MRI lumbar spine 10/03/2015.  FINDINGS: Lower chest: No acute findings.  Hepatobiliary: No mass visualized on this un-enhanced exam. Cholecystectomy.  Pancreas: No mass or inflammatory process identified on this un-enhanced exam.  Spleen: Within normal limits in size.  Adrenals/Urinary Tract: No evidence of urolithiasis or hydronephrosis. No definite mass visualized on this un-enhanced exam.  Stomach/Bowel: No evidence of obstruction, inflammatory process, or abnormal fluid collections.  Vascular/Lymphatic: No pathologically enlarged lymph nodes. No evidence of abdominal aortic aneurysm.  Reproductive: No mass or other significant abnormality.  Other: None.  Musculoskeletal: No suspicious bone lesions identified. There is severe degenerative disc disease at L1-2, with 4 mm retrolisthesis. Foraminal narrowing is worse on the RIGHT due to superimposed soft disc protrusion and extraforaminal spurring. Correlate clinically for RIGHT L1 radicular symptoms.  IMPRESSION: No nephrolithiasis or obstructive uropathy.  Upper lumbar degenerative disc disease could simulate renal pathology. Correlate clinically for RIGHT L1 radicular symptoms.   Electronically Signed By: Staci Righter M.D. On: 10/20/2015 16:21    I personally reviewed the radiologic studies   ED Course:  Patient's stay here was uneventful and she was given a Vicodin tablet with symptomatic relief. Based on her clinical presentation is seems to be some form of musculoskeletal pain. She does not appear to have community-acquired pneumonia, renal colic, kidney infection, diverticulitis, etc. Patient's stay here was uneventful and she clearly doesn't have peritoneal signs on exam.   assessment: * Right-sided abdominal and flank pain likely musculoskeletal   Final Clinical Impression:   Final diagnoses:  Right flank pain     Plan: * Outpatient  management Patient was advised to return immediately if condition worsens. Patient was advised to follow up with their primary care physician or other specialized physicians involved in their outpatient care. The patient and/or family member/power of attorney had laboratory results reviewed at the bedside. All questions and concerns were addressed and appropriate discharge instructions were distributed by the nursing staff. Patient was given a prescription for of Ultram for pain.           Daymon Larsen, MD 10/20/15 1700

## 2015-10-25 ENCOUNTER — Encounter: Payer: Self-pay | Admitting: Internal Medicine

## 2015-10-29 ENCOUNTER — Ambulatory Visit (INDEPENDENT_AMBULATORY_CARE_PROVIDER_SITE_OTHER): Payer: Medicare Other | Admitting: Cardiovascular Disease

## 2015-10-29 ENCOUNTER — Encounter: Payer: Self-pay | Admitting: Cardiovascular Disease

## 2015-10-29 VITALS — BP 108/64 | HR 86 | Ht 66.0 in | Wt 231.2 lb

## 2015-10-29 DIAGNOSIS — I2510 Atherosclerotic heart disease of native coronary artery without angina pectoris: Secondary | ICD-10-CM

## 2015-10-29 DIAGNOSIS — I951 Orthostatic hypotension: Secondary | ICD-10-CM | POA: Diagnosis not present

## 2015-10-29 DIAGNOSIS — E785 Hyperlipidemia, unspecified: Secondary | ICD-10-CM

## 2015-10-29 DIAGNOSIS — Z951 Presence of aortocoronary bypass graft: Secondary | ICD-10-CM

## 2015-10-29 DIAGNOSIS — I159 Secondary hypertension, unspecified: Secondary | ICD-10-CM

## 2015-10-29 MED ORDER — EZETIMIBE 10 MG PO TABS: 10.0000 mg | ORAL_TABLET | Freq: Every day | ORAL | Status: DC

## 2015-10-29 NOTE — Assessment & Plan Note (Addendum)
Recommended she decrease her carbohydrate intake Name for weight loss, increase exercise Dietary guide provided   Total encounter time more than 25 minutes  Greater than 50% was spent in counseling and coordination of care with the patient

## 2015-10-29 NOTE — Assessment & Plan Note (Signed)
Currently with no symptoms of angina. No further workup at this time.

## 2015-10-29 NOTE — Progress Notes (Signed)
Patient ID: [name], female    DOB: April 18, 1961, 55 y.o.   MRN: [id number]  HPI Comments: 55 yo woman with a long smoking history,  h/o CAD s/p  NSTEMI at [facility]  1/12, with BMS to pLAD (80% stenosis), residual severe stenosis of the RCA which is a nondominant small vessel, With continued chest pain, repeat cardiac catheterization showing distal left main stenosis involving a high grade ostial LAD stenosis and circumflex disease approximately 90%, 90% diagonal disease, 80% nondominant RCA disease with ejection fraction 45%, who underwent bypass surgery x4 with a LIMA to the LAD, vein graft to the diagonal, vein graft to the marginal and vein graft to the RCA,  Also with peripheral vascular disease, S/p atherectomy and PTA of her right SFA on 01/15/11,  who presents for routine followup of her coronary artery disease  In follow-up, she reports that she is doing well. She is having chronic back pain Recently seen in the emergency room last week for spasms in her lower back Denies any chest pain symptoms  Weight has been trending upwards, poor diet, no exercise She continues to use her CPAP, inhalers.  Tolerating Crestor 40 mg daily  EKG on today's visit shows normal sinus rhythm with rate 86 bpm, diffuse T-wave abnormality in the anterior precordial leads, inferior leads, PVC noted  Other past medical history Prior  outpatient stress test  July 2013 showed significant breast attenuation artifact, inferior wall artifact from GI uptake. Overall no significant ischemia. History of chronic left arm and upper left chest  pain and was seen pain clinic. Prior diagnosis of carpal tunnel syndrome  occasional left breast pain radiating to her mediastinum.    Previously seen by ear nose throat for vestibular testing as a cause of her dizziness.  EGD and colonoscopy which by her report showed hiatal hernia. Prior problems with swallowing  She has had MRI of the neck showing cervical disc disease  Lab  work shows total cholesterol 151, LDL 79, HDL 52, hemoglobin A1c 6.1  Echocardiogram in the hospital showed normal LV systolic function ejection fraction 50-55%, mild MR, mild to moderate TR with right ventricular systolic pressures [id number]  Allergies  Allergen Reactions  . Latex     Rash   . Shellfish Allergy     Hard shellfish/swelling in throat  . Sulfonamide Derivatives     Swelling in throat.    Outpatient Encounter Prescriptions as of 10/29/2015  Medication Sig  . albuterol (PROVENTIL HFA;VENTOLIN HFA) 108 (90 BASE) MCG/ACT inhaler Inhale 2 puffs into the lungs every 6 (six) hours as needed for wheezing or shortness of breath.  [name] aspirin 81 MG tablet Take 162 mg by mouth 2 (two) times daily.  . Biotin w/ Vitamins C & E (HAIR SKIN & NAILS GUMMIES PO) Take 2 tablets by mouth daily. Reported on 09/04/2015  . budesonide-formoterol (SYMBICORT) 160-4.5 MCG/ACT inhaler Inhale 2 puffs into the lungs 2 (two) times daily.  . cetirizine (ZYRTEC) 10 MG chewable tablet Chew 1 tablet (10 mg total) by mouth daily.  . clopidogrel (PLAVIX) 75 MG tablet TAKE ONE TABLET BY MOUTH ONCE DAILY  . CRESTOR 40 MG tablet TAKE ONE TABLET BY MOUTH ONCE DAILY  . cyclobenzaprine (FLEXERIL) 10 MG tablet Take 10 mg by mouth as needed.   [name] esomeprazole (NEXIUM) 40 MG capsule Reported on 09/04/2015  . fluticasone (FLONASE) 50 MCG/ACT nasal spray Place 2 sprays into both nostrils daily. Reported on 09/04/2015  . furosemide (LASIX) 20 MG tablet TAKE  ONE TABLET BY MOUTH ONCE DAILY FOR SWELLING. MAY TAKE ONE ADDITIONAL TABLET AS NEEDED FOR SWELLING/FLUID  . gabapentin (NEURONTIN) 300 MG capsule Take 300 mg by mouth at bedtime.  . isosorbide mononitrate (IMDUR) 30 MG 24 hr tablet TAKE ONE TABLET BY MOUTH TWICE DAILY (Patient taking differently: TAKE ONE TABLET BY MOUTH  DAILY)  . KLOR-CON M10 10 MEQ tablet TAKE ONE TABLET BY MOUTH ONCE DAILY  . lisinopril (PRINIVIL,ZESTRIL) 20 MG tablet TAKE ONE TABLET BY MOUTH ONCE DAILY  (Patient taking differently: TAKE ONE/HALF TABLET BY MOUTH ONCE DAILY)  . Magnesium 250 MG TABS Take 1 tablet by mouth daily as needed (leg cramps). Reported on 09/04/2015  . meclizine (ANTIVERT) 25 MG tablet Take 1 tablet (25 mg total) by mouth 2 (two) times daily. (Patient taking differently: Take 25 mg by mouth 3 (three) times daily. )  . meloxicam (MOBIC) 15 MG tablet Take 1 tablet (15 mg total) by mouth daily.  . metFORMIN (GLUCOPHAGE) 500 MG tablet   . metoprolol tartrate (LOPRESSOR) 25 MG tablet TAKE ONE TABLET BY MOUTH TWICE DAILY  . Mouthwash Compounding Base (MOUTH WASH-GP PO) Take by mouth. Reported on 09/04/2015  . Multiple Vitamin (MULTIVITAMIN) tablet Take 1 tablet by mouth daily.  . nitroGLYCERIN (NITROSTAT) 0.4 MG SL tablet Place 1 tablet (0.4 mg total) under the tongue every 5 (five) minutes as needed.  . NON FORMULARY cpap at bedtime  . PARoxetine (PAXIL) 40 MG tablet Take 40 mg by mouth every morning.  . potassium chloride (K-DUR) 10 MEQ tablet Take 1 tablet (10 mEq total) by mouth daily.  . traMADol (ULTRAM) 50 MG tablet Take 1 tablet (50 mg total) by mouth every 6 (six) hours as needed.  . ezetimibe (ZETIA) 10 MG tablet Take 1 tablet (10 mg total) by mouth daily.  . [DISCONTINUED] clopidogrel (PLAVIX) 75 MG tablet TAKE ONE TABLET BY MOUTH EVERY DAY (Patient not taking: Reported on 10/29/2015)  . [DISCONTINUED] furosemide (LASIX) 20 MG tablet TAKE ONE TABLET BY MOUTH EVERY DAY FOR SWELLING. MAY TAKE 1 ADDITIONAL TABLET AS NEEDED FOR SWELLING/FLUID.  . [DISCONTINUED] omeprazole (PRILOSEC) 40 MG capsule Take 1 capsule (40 mg total) by mouth daily. (Patient not taking: Reported on 10/29/2015)  . [DISCONTINUED] pantoprazole (PROTONIX) 40 MG tablet Reported on 10/29/2015  . [DISCONTINUED] PARoxetine (PAXIL) 20 MG tablet Take 20 mg by mouth daily. Reported on 10/29/2015  . [DISCONTINUED] traMADol-acetaminophen (ULTRACET) 37.5-325 MG per tablet Take 1-2 tablets by mouth every 6 (six) hours as  needed.   No facility-administered encounter medications on file as of 10/29/2015.    Past Medical History  Diagnosis Date  . Coronary artery disease     PCI of left circumflex 2003, PCI of the LAD 2012 with a bare-metal stent (2.5 x 8 mm);   s/p CABG 4/12:  L-LAD, S-Dx, S-OM, S-RCA (Dr. Prescott Gum)  . Hyperlipidemia   . Hypertension   . PAD (peripheral artery disease) (HCC)     s/p Right SFA atherectomy and PTA 01/15/11, normal ABIs 01/2011  . Brachial neuritis or radiculitis NOS   . Systolic CHF, chronic (HCC)     mild: echo 08/2010 - mildly reduced EF 40-45%, mild diffuse hypokinesis  . Anemia   . Cocaine abuse, in remission     clean x 24 years  . Arthritis   . Blood in stool   . History of cervical cancer     s/p cryotherapy  . Depression   . Generalized headaches  frequent  . GERD (gastroesophageal reflux disease)   . Seasonal allergies   . Cardiac arrhythmia   . Heart murmur   . History of hepatitis B     from eating undercooked liver  . History of kidney stones   . Urine incontinence   . History of drug abuse     cocaine, marijuana, clean since 1989  . Smoking history     quit 07/2010  . Chronic pain   . Cervical neck pain with evidence of disc disease     C5/6 disease, MRI done late 2012 - no records available  . Bronchitis   . Bacterial vaginitis   . History of MI (myocardial infarction)   . Thyroid disease   . Hematochezia   . Family history of colon cancer   . Polyp of colon   . Congestive heart failure ([place])   . Sleep apnea   . COPD (chronic obstructive pulmonary disease) [facility])     Past Surgical History  Procedure Laterality Date  . Coronary artery bypass graft  2012    Dr [name]  . Cholecystectomy  1980  . Coronary stent placement  2003    S/P MI  . Coronary stent placement  2007    [place]  . Femoral artery stent  10/2010    right sided (Dr. Burt Knack)  . Coronary angioplasty      w/ stent placement x2  . Colonoscopy  2008    3 polyps  .  Colonoscopy with propofol N/A 02/06/2015    Procedure: COLONOSCOPY WITH PROPOFOL;  Surgeon: Lucilla Lame, MD;  Location: [facility] ENDOSCOPY;  Service: Endoscopy;  Laterality: N/A;  . Esophagogastroduodenoscopy (egd) with propofol N/A 02/06/2015    Procedure: ESOPHAGOGASTRODUODENOSCOPY (EGD) WITH PROPOFOL;  Surgeon: Lucilla Lame, MD;  Location: [facility] ENDOSCOPY;  Service: Endoscopy;  Laterality: N/A;    Social History  reports that she quit smoking about 5 years ago. She has never used smokeless tobacco. She reports that she does not drink alcohol or use illicit drugs.  Family History family history includes Brain cancer in her sister; Breast cancer (age of onset: 73) in her mother; Colon cancer (age of onset: 29) in her father; Diabetes in her father; Glaucoma in her father; Heart failure in her father; Hypertension in her father. There is no history of Coronary artery disease or Stroke.  Review of Systems  Constitutional: Negative.   Eyes: Negative.   Respiratory: Negative.   Cardiovascular: Negative.   Gastrointestinal: Negative.   Musculoskeletal: Positive for myalgias, back pain, joint swelling and arthralgias.  Skin: Negative.   Neurological: Negative.   Hematological: Negative.   Psychiatric/Behavioral: Negative.   All other systems reviewed and are negative.  BP 108/64 mmHg  Pulse 86  Ht 5\' 6"  (1.676 m)  Wt 231 lb 4 oz (104.894 kg)  BMI 37.34 kg/m2  Physical Exam  Constitutional: She is oriented to person, place, and time. She appears well-developed and well-nourished.  Obese  HENT:  Head: Normocephalic.  Nose: Nose normal.  Mouth/Throat: Oropharynx is clear and moist.  Eyes: Conjunctivae are normal. Pupils are equal, round, and reactive to light.  Neck: Normal range of motion. Neck supple. No JVD present.  Cardiovascular: Normal rate, regular rhythm, S1 normal, S2 normal, normal heart sounds and intact distal pulses.  Exam reveals no gallop and no friction rub.   No murmur  heard. Pulmonary/Chest: Effort normal and breath sounds normal. No respiratory distress. She has no wheezes. She has no rales. She exhibits no tenderness.  Abdominal: Soft. Bowel sounds are normal. She exhibits no distension. There is no tenderness.  Musculoskeletal: Normal range of motion. She exhibits no edema or tenderness.  Lymphadenopathy:    She has no cervical adenopathy.  Neurological: She is alert and oriented to person, place, and time. Coordination normal.  Skin: Skin is warm and dry. No rash noted. No erythema.  Psychiatric: She has a normal mood and affect. Her behavior is normal. Judgment and thought content normal.    Assessment and Plan  Nursing note and vitals reviewed.

## 2015-10-29 NOTE — Assessment & Plan Note (Signed)
Recommended weight loss Start zetia daily, continue Crestor

## 2015-10-29 NOTE — Patient Instructions (Addendum)
You are doing well.  Ok to decrease the lasix and potassium down to every other day  Please start zetia one a day with crestor for cholesterol  Please call us if you have new issues that need to be addressed before your next appt.  Your physician wants you to follow-up in: 6 months.  You will receive a reminder letter in the mail two months in advance. If you don't receive a letter, please call our office to schedule the follow-up appointment.

## 2015-10-29 NOTE — Assessment & Plan Note (Signed)
Recent low blood pressure, recommended she take Lasix every other day with potassium Lisinopril recently decreased down to 10 mg daily

## 2015-11-01 ENCOUNTER — Ambulatory Visit
Admission: RE | Admit: 2015-11-01 | Discharge: 2015-11-01 | Disposition: A | Discharge status: Home / Self Care | Payer: Medicare Other | Source: Ambulatory Visit | Attending: Orthopedic Surgery | Admitting: Orthopedic Surgery

## 2015-11-01 DIAGNOSIS — G8929 Other chronic pain: Secondary | ICD-10-CM | POA: Insufficient documentation

## 2015-11-01 DIAGNOSIS — M4806 Spinal stenosis, lumbar region: Secondary | ICD-10-CM | POA: Insufficient documentation

## 2015-11-01 DIAGNOSIS — M7138 Other bursal cyst, other site: Secondary | ICD-10-CM | POA: Insufficient documentation

## 2015-11-01 DIAGNOSIS — M5442 Lumbago with sciatica, left side: Secondary | ICD-10-CM | POA: Diagnosis present

## 2015-11-01 DIAGNOSIS — M5441 Lumbago with sciatica, right side: Secondary | ICD-10-CM

## 2016-02-11 ENCOUNTER — Other Ambulatory Visit: Payer: Self-pay | Admitting: Internal Medicine

## 2016-02-11 DIAGNOSIS — R109 Unspecified abdominal pain: Secondary | ICD-10-CM

## 2016-02-15 ENCOUNTER — Ambulatory Visit
Admission: RE | Admit: 2016-02-15 | Discharge: 2016-02-15 | Disposition: A | Discharge status: Home / Self Care | Payer: Medicare Other | Source: Ambulatory Visit | Attending: Internal Medicine | Admitting: Internal Medicine

## 2016-02-15 DIAGNOSIS — R935 Abnormal findings on diagnostic imaging of other abdominal regions, including retroperitoneum: Secondary | ICD-10-CM | POA: Insufficient documentation

## 2016-02-15 DIAGNOSIS — R945 Abnormal results of liver function studies: Secondary | ICD-10-CM | POA: Diagnosis not present

## 2016-02-15 DIAGNOSIS — R109 Unspecified abdominal pain: Secondary | ICD-10-CM | POA: Insufficient documentation

## 2016-02-15 DIAGNOSIS — Z9049 Acquired absence of other specified parts of digestive tract: Secondary | ICD-10-CM | POA: Diagnosis not present

## 2016-04-01 ENCOUNTER — Encounter: Payer: Self-pay | Admitting: Cardiovascular Disease

## 2016-04-01 ENCOUNTER — Ambulatory Visit (INDEPENDENT_AMBULATORY_CARE_PROVIDER_SITE_OTHER): Payer: Medicare Other | Admitting: Cardiovascular Disease

## 2016-04-01 VITALS — BP 100/70 | HR 75 | Ht 66.0 in | Wt 216.5 lb

## 2016-04-01 DIAGNOSIS — I509 Heart failure, unspecified: Secondary | ICD-10-CM | POA: Diagnosis not present

## 2016-04-01 DIAGNOSIS — I2510 Atherosclerotic heart disease of native coronary artery without angina pectoris: Secondary | ICD-10-CM

## 2016-04-01 DIAGNOSIS — I159 Secondary hypertension, unspecified: Secondary | ICD-10-CM

## 2016-04-01 DIAGNOSIS — R079 Chest pain, unspecified: Secondary | ICD-10-CM

## 2016-04-01 DIAGNOSIS — Z9989 Dependence on other enabling machines and devices: Secondary | ICD-10-CM

## 2016-04-01 DIAGNOSIS — G4733 Obstructive sleep apnea (adult) (pediatric): Secondary | ICD-10-CM | POA: Diagnosis not present

## 2016-04-01 NOTE — Patient Instructions (Addendum)
Medication Instructions:   No medication changes made  Ok to stop plavix for at least 7 days before epidural shots  Labwork:  No new labs needed  Testing/Procedures:  No further testing at this time   Follow-Up: It was a pleasure seeing you in the office today. Please call us if you have new issues that need to be addressed before your next appt.  [phone]  Your physician wants you to follow-up in: 6 months.  You will receive a reminder letter in the mail two months in advance. If you don't receive a letter, please call our office to schedule the follow-up appointment.  If you need a refill on your cardiac medications before your next appointment, please call your pharmacy.

## 2016-04-01 NOTE — Progress Notes (Signed)
Cardiology Office Note  Date:  04/01/2016   ID:  [name], DOB 02-13-1961, MRN [id number]  PCP:  Perrin Maltese, MD   Chief Complaint  Patient presents with  . Other    C/o chest pain. Meds reviewed verbally with pt.    HPI:  55 yo woman with a long smoking history,  h/o CAD s/p  NSTEMI at [facility]  1/12, with BMS to pLAD (80% stenosis), residual severe stenosis of the RCA which is a nondominant small vessel, With continued chest pain, repeat cardiac catheterization showing distal left main stenosis involving a high grade ostial LAD stenosis and circumflex disease approximately 90%, 90% diagonal disease, 80% nondominant RCA disease with ejection fraction 45%, who underwent bypass surgery x4 with a LIMA to the LAD, vein graft to the diagonal, vein graft to the marginal and vein graft to the RCA,  Also with peripheral vascular disease, S/p atherectomy and PTA of her right SFA on 01/15/11,  who presents for routine followup of her coronary artery disease CABG 2012  In follow-up today, she denies any symptoms of chest pain, no angina Continued back pain, Needs more epidural shots Sept 26th Already had one epidural shots. She did stop her Plavix prior to the procedure   no exercise Has CPAP, not using it, inhalers, having trouble with setting Did sleep study at [facility] many years ago, has not had follow-up He does not have a pulmonologist  Tolerating Crestor 40 mg daily  EKG on today's visit shows normal sinus rhythm with rate 75 bpm, diffuse T-wave abnormality in the anterior precordial leads  Other past medical history Prior  outpatient stress test  July 2013 showed significant breast attenuation artifact, inferior wall artifact from GI uptake. Overall no significant ischemia. History of chronic left arm and upper left chest  pain and was seen pain clinic. Prior diagnosis of carpal tunnel syndrome  occasional left breast pain radiating to her mediastinum.    Previously seen by  ear nose throat for vestibular testing as a cause of her dizziness.  EGD and colonoscopy which by her report showed hiatal hernia. Prior problems with swallowing  She has had MRI of the neck showing cervical disc disease  Lab work shows total cholesterol 151, LDL 79, HDL 52, hemoglobin A1c 6.1  Echocardiogram in the hospital showed normal LV systolic function ejection fraction 50-55%, mild MR, mild to moderate TR with right ventricular systolic pressures [id number]  PMH:   has a past medical history of Anemia; Arthritis; Bacterial vaginitis; Blood in stool; Brachial neuritis or radiculitis NOS; Bronchitis; Cardiac arrhythmia; Cervical neck pain with evidence of disc disease; Chronic pain; Cocaine abuse, in remission; Congestive heart failure (HCC); COPD (chronic obstructive pulmonary disease) ([place]); Coronary artery disease; Depression; Family history of colon cancer; Generalized headaches; GERD (gastroesophageal reflux disease); Heart murmur; Hematochezia; History of cervical cancer; History of drug abuse; History of hepatitis B; History of kidney stones; History of MI (myocardial infarction); Hyperlipidemia; Hypertension; PAD (peripheral artery disease) ([place]); Polyp of colon; Seasonal allergies; Sleep apnea; Smoking history; Systolic CHF, chronic ([place]); Thyroid disease; and Urine incontinence.  PSH:    Past Surgical History:  Procedure Laterality Date  . CHOLECYSTECTOMY  1980  . COLONOSCOPY  2008   3 polyps  . COLONOSCOPY WITH PROPOFOL N/A 02/06/2015   Procedure: COLONOSCOPY WITH PROPOFOL;  Surgeon: Lucilla Lame, MD;  Location: [facility] ENDOSCOPY;  Service: Endoscopy;  Laterality: N/A;  . CORONARY ANGIOPLASTY     w/ stent placement x2  . CORONARY ARTERY  BYPASS GRAFT  2012   Dr [name]  . CORONARY STENT PLACEMENT  2003   S/P MI  . CORONARY STENT PLACEMENT  2007   [place]  . ESOPHAGOGASTRODUODENOSCOPY (EGD) WITH PROPOFOL N/A 02/06/2015   Procedure: ESOPHAGOGASTRODUODENOSCOPY (EGD) WITH PROPOFOL;   Surgeon: Lucilla Lame, MD;  Location: [facility] ENDOSCOPY;  Service: Endoscopy;  Laterality: N/A;  . FEMORAL ARTERY STENT  10/2010   right sided (Dr. Burt Knack)    Current Outpatient Prescriptions  Medication Sig Dispense Refill  . albuterol (PROVENTIL HFA;VENTOLIN HFA) 108 (90 BASE) MCG/ACT inhaler Inhale 2 puffs into the lungs every 6 (six) hours as needed for wheezing or shortness of breath. 1 Inhaler 2  . aspirin 81 MG tablet Take 162 mg by mouth 2 (two) times daily.    . Biotin w/ Vitamins C & E (HAIR SKIN & NAILS GUMMIES PO) Take 2 tablets by mouth daily. Reported on 09/04/2015    . budesonide-formoterol (SYMBICORT) 160-4.5 MCG/ACT inhaler Inhale 2 puffs into the lungs 2 (two) times daily.    . cetirizine (ZYRTEC) 10 MG chewable tablet Chew 1 tablet (10 mg total) by mouth daily.    . clopidogrel (PLAVIX) 75 MG tablet TAKE ONE TABLET BY MOUTH ONCE DAILY 90 tablet 3  . CRESTOR 40 MG tablet TAKE ONE TABLET BY MOUTH ONCE DAILY 90 tablet 3  . cyclobenzaprine (FLEXERIL) 10 MG tablet Take 10 mg by mouth as needed.     [name] esomeprazole (NEXIUM) 40 MG capsule Reported on 09/04/2015    . ezetimibe (ZETIA) 10 MG tablet Take 1 tablet (10 mg total) by mouth daily. 90 tablet 4  . fluticasone (FLONASE) 50 MCG/ACT nasal spray Place 2 sprays into both nostrils daily. Reported on 09/04/2015    . furosemide (LASIX) 20 MG tablet TAKE ONE TABLET BY MOUTH ONCE DAILY FOR SWELLING. MAY TAKE ONE ADDITIONAL TABLET AS NEEDED FOR SWELLING/FLUID 180 tablet 3  . gabapentin (NEURONTIN) 300 MG capsule Take 300 mg by mouth at bedtime.    . isosorbide mononitrate (IMDUR) 30 MG 24 hr tablet TAKE ONE TABLET BY MOUTH TWICE DAILY (Patient taking differently: TAKE ONE TABLET BY MOUTH  DAILY) 180 tablet 3  . KLOR-CON M10 10 MEQ tablet TAKE ONE TABLET BY MOUTH ONCE DAILY 90 tablet 3  . lisinopril (PRINIVIL,ZESTRIL) 20 MG tablet TAKE ONE TABLET BY MOUTH ONCE DAILY (Patient taking differently: TAKE ONE/HALF TABLET BY MOUTH ONCE DAILY) 90 tablet 3   . Magnesium 250 MG TABS Take 1 tablet by mouth daily as needed (leg cramps). Reported on 09/04/2015    . meclizine (ANTIVERT) 25 MG tablet Take 1 tablet (25 mg total) by mouth 2 (two) times daily. (Patient taking differently: Take 25 mg by mouth 3 (three) times daily. ) 30 tablet   . metFORMIN (GLUCOPHAGE) 500 MG tablet     . metoprolol tartrate (LOPRESSOR) 25 MG tablet TAKE ONE TABLET BY MOUTH TWICE DAILY 180 tablet 3  . Mouthwash Compounding Base (MOUTH WASH-GP PO) Take by mouth. Reported on 09/04/2015    . Multiple Vitamin (MULTIVITAMIN) tablet Take 1 tablet by mouth daily.    . nitroGLYCERIN (NITROSTAT) 0.4 MG SL tablet Place 1 tablet (0.4 mg total) under the tongue every 5 (five) minutes as needed.    . NON FORMULARY cpap at bedtime    . PARoxetine (PAXIL) 40 MG tablet Take 40 mg by mouth every morning.    . potassium chloride (K-DUR) 10 MEQ tablet Take 1 tablet (10 mEq total) by mouth daily. 90 tablet 3  .  traMADol (ULTRAM) 50 MG tablet Take 1 tablet (50 mg total) by mouth every 6 (six) hours as needed. 20 tablet 0  . meloxicam (MOBIC) 15 MG tablet Take 1 tablet (15 mg total) by mouth daily. (Patient not taking: Reported on 04/01/2016)     No current facility-administered medications for this visit.      Allergies:   Latex; Shellfish allergy; and Sulfonamide derivatives   Social History:  The patient  reports that she quit smoking about 5 years ago. She has a 38.00 pack-year smoking history. She has never used smokeless tobacco. She reports that she does not drink alcohol or use drugs.   Family History:   family history includes Brain cancer in her sister; Breast cancer (age of onset: 39) in her mother; Colon cancer (age of onset: 6) in her father; Diabetes in her father; Glaucoma in her father; Heart failure in her father; Hypertension in her father.    Review of Systems: Review of Systems  Constitutional: Negative.   Respiratory: Negative.   Cardiovascular: Negative.    Gastrointestinal: Negative.   Musculoskeletal: Positive for back pain.  Neurological: Negative.   Psychiatric/Behavioral: Negative.   All other systems reviewed and are negative.    PHYSICAL EXAM: VS:  BP 100/70 (BP Location: Left Arm, Patient Position: Sitting, Cuff Size: Large)   Pulse 75   Ht 5\' 6"  (1.676 m)   Wt 216 lb 8 oz (98.2 kg)   BMI 34.94 kg/m  , BMI Body mass index is 34.94 kg/m. GEN: Well nourished, well developed, in no acute distress  HEENT: normal  Neck: no JVD, carotid bruits, or masses Cardiac: RRR; no murmurs, rubs, or gallops,no edema  Respiratory:  clear to auscultation bilaterally, normal work of breathing GI: soft, nontender, nondistended, + BS MS: no deformity or atrophy  Skin: warm and dry, no rash Neuro:  Strength and sensation are intact Psych: euthymic mood, full affect    Recent Labs: 10/20/2015: ALT 19; BUN 20; Creatinine, Ser 1.07; Hemoglobin 12.7; Platelets 203; Potassium 4.1; Sodium 138    Lipid Panel Lab Results  Component Value Date   CHOL 235 (H) 07/11/2011   HDL 58 07/11/2011   LDLCALC 149 (H) 07/11/2011   TRIG 140 07/11/2011      Wt Readings from Last 3 Encounters:  04/01/16 216 lb 8 oz (98.2 kg)  10/29/15 231 lb 4 oz (104.9 kg)  10/20/15 230 lb (104.3 kg)       ASSESSMENT AND PLAN:  CAD/ CABG:  Denies any symptoms concerning for angina No further testing  Secondary hypertension, unspecified - Plan: EKG 12-Lead, Ambulatory referral to Pulmonology Blood pressure running low today. No changes made to the medications. Recommended she call our office if blood pressure continues to run low or if she has lightheadedness/orthostasis  Chest pain, unspecified chest pain type -  Currently with no symptoms of angina. No further workup at this time. Continue current medication regimen.  OSA on CPAP  No prior follow-up with pulmonary, currently feels her settings are set wrong, not using her CPAP. Sleep study several years  ago. Ordered some supplies for her machine but these have not come to her. Has not seen pulmonary for her underlying COPD, long prior smoking history. Recommended she establish with pulmonary for assistance with her CPAP and underlying COPD  COPD   stop smoking several years ago Reports chronic coughing. Unclear if there is a component of postnasal drip or chronic bronchitis. Will refer to pulmonary to make sure she  is on the correct regiment    Total encounter time more than 25 minutes  Greater than 50% was spent in counseling and coordination of care with the patient   Disposition:   F/U  6 months    Orders Placed This Encounter  Procedures  . Ambulatory referral to Pulmonology  . EKG 12-Lead     Signed, Esmond Plants, M.D., Ph.D. 04/01/2016  [employer], [place]

## 2016-04-10 ENCOUNTER — Ambulatory Visit (INDEPENDENT_AMBULATORY_CARE_PROVIDER_SITE_OTHER): Payer: Medicare Other | Admitting: Pulmonary Disease

## 2016-04-10 ENCOUNTER — Encounter: Payer: Self-pay | Admitting: Pulmonary Disease

## 2016-04-10 VITALS — BP 128/64 | HR 77 | Ht 65.0 in | Wt 216.6 lb

## 2016-04-10 DIAGNOSIS — G4733 Obstructive sleep apnea (adult) (pediatric): Secondary | ICD-10-CM | POA: Diagnosis not present

## 2016-04-10 DIAGNOSIS — R0600 Dyspnea, unspecified: Secondary | ICD-10-CM

## 2016-04-10 DIAGNOSIS — I255 Ischemic cardiomyopathy: Secondary | ICD-10-CM | POA: Diagnosis not present

## 2016-04-10 DIAGNOSIS — I2589 Other forms of chronic ischemic heart disease: Secondary | ICD-10-CM

## 2016-04-10 DIAGNOSIS — Z87891 Personal history of nicotine dependence: Secondary | ICD-10-CM | POA: Diagnosis not present

## 2016-04-10 DIAGNOSIS — E669 Obesity, unspecified: Secondary | ICD-10-CM

## 2016-04-10 MED ORDER — ALBUTEROL SULFATE HFA 108 (90 BASE) MCG/ACT IN AERS: 2.0000 | INHALATION_SPRAY | Freq: Four times a day (QID) | RESPIRATORY_TRACT | 3 refills | Status: DC | PRN

## 2016-04-10 NOTE — Progress Notes (Signed)
PULMONARY CONSULT NOTE  Requesting MD/Service: [name] Date of initial consultation: 04/10/16 Reason for consultation: COPD, OSA  PT PROFILE: 66 F former smoker with CAD, s/p CABG in 2012 referred by Dr [name] for mgmt of OSA and dyspnea. She was previosuly diagnosed with COPD but notably, PFTs in 2012 revealed no definite obstruction and mild decrease in DLCO.  HPI:  [name] who quit in 2012 after CABG, previously diagnosed with COPD and prescribed Symbicort inhaler which she uses as needed and albuterol inhaler which she doesn't use at all. She also has OSA which was diagnosed approx 20 yrs ago. She has a nonfunctional CPAP machine @ home and has not been treated for this in many years. She has class II/III dyspnea with day to day variation that responds to Symbicort when she uses it. She denies CP, fever, purulent sputum, hemoptysis, LE edema and calf tenderness. She reports severe daytime sleepiness and scores 19 on the Epworth scale.   Past Medical History:  Diagnosis Date  . Anemia   . Arthritis   . Bacterial vaginitis   . Blood in stool   . Brachial neuritis or radiculitis NOS   . Bronchitis   . Cardiac arrhythmia   . Cervical neck pain with evidence of disc disease    C5/6 disease, MRI done late 2012 - no records available  . Chronic pain   . Cocaine abuse, in remission    clean x 24 years  . Congestive heart failure ([name])   . COPD (chronic obstructive pulmonary disease) ([place])   . Coronary artery disease    PCI of left circumflex 2003, PCI of the LAD 2012 with a bare-metal stent (2.5 x 8 mm);   s/p CABG 4/12:  L-LAD, S-Dx, S-OM, S-RCA (Dr. Prescott Ward)  . Depression   . Family history of colon cancer   . Generalized headaches    frequent  . GERD (gastroesophageal reflux disease)   . Heart murmur   . Hematochezia   . History of cervical cancer    s/p cryotherapy  . History of drug abuse    cocaine, marijuana, clean since 1989  . History of hepatitis B    from  eating undercooked liver  . History of kidney stones   . History of MI (myocardial infarction)   . Hyperlipidemia   . Hypertension   . PAD (peripheral artery disease) (HCC)    s/p Right SFA atherectomy and PTA 01/15/11, normal ABIs 01/2011  . Polyp of colon   . Seasonal allergies   . Sleep apnea   . Smoking history    quit 07/2010  . Systolic CHF, chronic (HCC)    mild: echo 08/2010 - mildly reduced EF 40-45%, mild diffuse hypokinesis  . Thyroid disease   . Urine incontinence     Past Surgical History:  Procedure Laterality Date  . CHOLECYSTECTOMY  1980  . COLONOSCOPY  2008   3 polyps  . COLONOSCOPY WITH PROPOFOL N/A 02/06/2015   Procedure: COLONOSCOPY WITH PROPOFOL;  Surgeon: Lorraine Lame, MD;  Location: [facility] ENDOSCOPY;  Service: Endoscopy;  Laterality: N/A;  . CORONARY ANGIOPLASTY     w/ stent placement x2  . CORONARY ARTERY BYPASS GRAFT  2012   Dr [name]  . CORONARY STENT PLACEMENT  2003   S/P MI  . CORONARY STENT PLACEMENT  2007   [place]  . ESOPHAGOGASTRODUODENOSCOPY (EGD) WITH PROPOFOL N/A 02/06/2015   Procedure: ESOPHAGOGASTRODUODENOSCOPY (EGD) WITH PROPOFOL;  Surgeon: Lorraine Lame, MD;  Location: [facility] ENDOSCOPY;  Service: Endoscopy;  Laterality: N/A;  . FEMORAL ARTERY STENT  10/2010   right sided (Dr. Burt Ward)    MEDICATIONS: I have reviewed all medications and confirmed regimen as documented  Social History   Social History  . Marital status: Legally Separated    Spouse name: N/A  . Number of children: N/A  . Years of education: N/A   Occupational History  . Not on file.   Social History Main Topics  . Smoking status: Former Smoker    Packs/day: 1.00    Years: 38.00    Quit date: 08/12/2010  . Smokeless tobacco: Never Used  . Alcohol use No  . Drug use: No     Comment: Remote Hx (crack cocaine and marijuana)  . Sexual activity: Not on file   Other Topics Concern  . Not on file   Social History Narrative   Caffeine: 1 cup coffee/day   Lives alone, no  pets   Occupation: industrial work, prior [employer], in process of getting disability, initially denied   Edu: 11th grade   Activity: no regular exercise   Diet: good water, vegetables daily, low salt diet    Family History  Problem Relation Age of Onset  . Hypertension Father   . Heart failure Father   . Diabetes Father   . Colon cancer Father 43  . Glaucoma Father   . Breast cancer Mother 80    breast cancer, late 90's  . Brain cancer Sister   . Coronary artery disease Neg Hx   . Stroke Neg Hx     ROS: No fever, myalgias/arthralgias, unexplained weight loss or weight gain No new focal weakness or sensory deficits No otalgia, hearing loss, visual changes, nasal and sinus symptoms, mouth and throat problems No neck pain or adenopathy No abdominal pain, N/V/D, diarrhea, change in bowel pattern No dysuria, change in urinary pattern   Vitals:   04/10/16 1039  BP: 128/64  Pulse: 77  SpO2: 99%  Weight: 216 lb 9.6 oz (98.2 kg)  Height: 5\' 5"  (1.651 m)     EXAM:  Gen: obese, No overt respiratory distress HEENT: NCAT, sclera white, oropharynx normal Neck: Supple without LAN, thyromegaly, JVD Lungs: breath sounds: full, percussion: normal, No wheezes or crackles Cardiovascular: RRR, no murmurs noted Abdomen: Soft, nontender, normal BS Ext: without clubbing, cyanosis, edema Neuro: CNs grossly intact, motor and sensory intact Skin: Limited exam, no lesions noted  DATA:   BMP Latest Ref Rng & Units 10/20/2015 01/20/2014 01/03/2013  Glucose 65 - 99 mg/dL 118(H) 91 97  BUN 6 - 20 mg/dL 20 16 12   Creatinine 0.44 - 1.00 mg/dL 1.07(H) 0.74 0.77  BUN/Creat Ratio 9 - 23 - 22 16  Sodium 135 - 145 mmol/L 138 142 145(H)  Potassium 3.5 - 5.1 mmol/L 4.1 4.2 4.1  Chloride 101 - 111 mmol/L 104 105 107  CO2 22 - 32 mmol/L 28 24 19   Calcium 8.9 - 10.3 mg/dL 9.6 9.5 9.9    CBC Latest Ref Rng & Units 10/20/2015 02/13/2012 01/13/2011  WBC 3.6 - 11.0 K/uL 7.1 10.8 7.1  Hemoglobin 12.0 - 16.0  g/dL 12.7 12.8 12.1  Hematocrit 35.0 - 47.0 % 38.0 38.3 35.6(L)  Platelets 150 - 440 K/uL 203 144(L) 176.0    CXR:  No recent film  IMPRESSION:     ICD-9-CM ICD-10-CM   1. Dyspnea 786.09 R06.00 Pulmonary function test     DG Chest 2 View  2. Former smoker V15.82 Z87.891   3.  OSA (obstructive sleep apnea) 327.23 G47.33 Split night study  4. Obesity 278.00 E66.9   5. Cardiomyopathy, ischemic 414.8 I25.5    The dyspnea is not well explained. Based on PFTs from 2012, it is unlikely that she has COPD. Most likely due to obesity, deconditioning and ischemic cardiomyopathy (LVEF 45-50% in 2012). However, she does seem to respond favorably to Symbicort suggesting possible reactive airways disease  PLAN:  For Dyspnea: 1) Instructed to use Symbicort every day and albuterol as her rescue inhaler 2) follow up in 4-6 weeks with CXR and PFTs  For OSA and severe daytime hypersomnolence, 3) We will need to repeat polysomnogram 4) likely resume CPAP based on results   Merton Border, MD PCCM service Mobile [phone] Pager [phone] 04/11/2016

## 2016-04-10 NOTE — Patient Instructions (Signed)
For the sleep apnea  1) we will repeat the sleep study and then arrange for a new CPAP machine   For the shortness of breath - this is probably due to multiple causes including your heart and your lungs. Your previous lung function tests in 2012 looked pretty good  1) use Symbicort EVERY DAY ON A SCHEDULE 2 puffs twice a day 2) your albuterol inhaler is the one to carry with you and use as needed 3) when you return in 4-6 weeks, we will obtain a chest Xray and lung function tests

## 2016-05-06 ENCOUNTER — Ambulatory Visit: Payer: Medicare Other | Attending: Internal Medicine

## 2016-05-06 DIAGNOSIS — G4761 Periodic limb movement disorder: Secondary | ICD-10-CM | POA: Insufficient documentation

## 2016-05-06 DIAGNOSIS — G4733 Obstructive sleep apnea (adult) (pediatric): Secondary | ICD-10-CM | POA: Insufficient documentation

## 2016-05-09 ENCOUNTER — Telehealth: Payer: Self-pay

## 2016-05-09 DIAGNOSIS — G4733 Obstructive sleep apnea (adult) (pediatric): Secondary | ICD-10-CM

## 2016-05-09 NOTE — Telephone Encounter (Signed)
Laverle Hobby, MD  Shon Hale, CMA        Pt has htn, cardiomyopathy, MI, COPD. Sleep study was positive for mild sleep apnea, will send for in-lab titration study   Order placed. Pt aware & voiced understanding. Nothing further needed.

## 2016-05-12 ENCOUNTER — Encounter: Payer: Self-pay | Admitting: Internal Medicine

## 2016-05-12 ENCOUNTER — Other Ambulatory Visit: Payer: Self-pay

## 2016-05-12 DIAGNOSIS — G4733 Obstructive sleep apnea (adult) (pediatric): Secondary | ICD-10-CM

## 2016-05-13 ENCOUNTER — Other Ambulatory Visit: Payer: Self-pay | Admitting: Internal Medicine

## 2016-05-16 ENCOUNTER — Ambulatory Visit
Admission: RE | Admit: 2016-05-16 | Discharge: 2016-05-16 | Disposition: A | Discharge status: Home / Self Care | Payer: Medicare Other | Source: Ambulatory Visit | Attending: Pulmonary Disease | Admitting: Pulmonary Disease

## 2016-05-16 ENCOUNTER — Other Ambulatory Visit: Payer: Self-pay | Admitting: Internal Medicine

## 2016-05-16 ENCOUNTER — Ambulatory Visit (INDEPENDENT_AMBULATORY_CARE_PROVIDER_SITE_OTHER): Payer: Medicare Other | Admitting: *Deleted

## 2016-05-16 DIAGNOSIS — I517 Cardiomegaly: Secondary | ICD-10-CM | POA: Insufficient documentation

## 2016-05-16 DIAGNOSIS — Z951 Presence of aortocoronary bypass graft: Secondary | ICD-10-CM | POA: Insufficient documentation

## 2016-05-16 DIAGNOSIS — R0600 Dyspnea, unspecified: Secondary | ICD-10-CM

## 2016-05-16 DIAGNOSIS — R918 Other nonspecific abnormal finding of lung field: Secondary | ICD-10-CM | POA: Insufficient documentation

## 2016-05-16 DIAGNOSIS — Z1231 Encounter for screening mammogram for malignant neoplasm of breast: Secondary | ICD-10-CM

## 2016-05-16 LAB — PULMONARY FUNCTION TEST
DL/VA % pred: 72 %
DL/VA: 3.58 ml/min/mmHg/L
DLCO unc % pred: 63 %
DLCO unc: 16.28 ml/min/mmHg
FEF 25-75 Post: 1.5 L/sec
FEF 25-75 Pre: 1.96 L/sec
FEF2575-%CHANGE-POST: -23 %
FEF2575-%PRED-PRE: 88 %
FEF2575-%Pred-Post: 67 %
FEV1-%CHANGE-POST: -6 %
FEV1-%PRED-POST: 88 %
FEV1-%PRED-PRE: 94 %
FEV1-PRE: 2.11 L
FEV1-Post: 1.98 L
FEV1FVC-%CHANGE-POST: 0 %
FEV1FVC-%Pred-Pre: 98 %
FEV6-%Change-Post: -6 %
FEV6-%PRED-PRE: 97 %
FEV6-%Pred-Post: 90 %
FEV6-PRE: 2.68 L
FEV6-Post: 2.49 L
FEV6FVC-%PRED-PRE: 103 %
FEV6FVC-%Pred-Post: 103 %
FVC-%CHANGE-POST: -6 %
FVC-%PRED-PRE: 94 %
FVC-%Pred-Post: 87 %
FVC-POST: 2.49 L
FVC-PRE: 2.68 L
POST FEV1/FVC RATIO: 79 %
PRE FEV6/FVC RATIO: 100 %
Post FEV6/FVC ratio: 100 %
Pre FEV1/FVC ratio: 79 %
RV % PRED: 115 %
RV: 2.29 L
TLC % pred: 98 %
TLC: 5.1 L

## 2016-05-16 NOTE — Progress Notes (Signed)
PFT performed today. 

## 2016-05-26 ENCOUNTER — Other Ambulatory Visit: Payer: Self-pay | Admitting: Cardiovascular Disease

## 2016-05-30 ENCOUNTER — Encounter: Payer: Self-pay | Admitting: Pulmonary Disease

## 2016-05-30 ENCOUNTER — Other Ambulatory Visit: Payer: Self-pay | Admitting: Internal Medicine

## 2016-05-30 ENCOUNTER — Ambulatory Visit (INDEPENDENT_AMBULATORY_CARE_PROVIDER_SITE_OTHER): Payer: Medicare Other | Admitting: Pulmonary Disease

## 2016-05-30 VITALS — BP 132/88 | HR 78 | Wt 216.0 lb

## 2016-05-30 DIAGNOSIS — R05 Cough: Secondary | ICD-10-CM | POA: Diagnosis not present

## 2016-05-30 DIAGNOSIS — I255 Ischemic cardiomyopathy: Secondary | ICD-10-CM | POA: Diagnosis not present

## 2016-05-30 DIAGNOSIS — G4710 Hypersomnia, unspecified: Secondary | ICD-10-CM

## 2016-05-30 DIAGNOSIS — R058 Other specified cough: Secondary | ICD-10-CM

## 2016-05-30 DIAGNOSIS — G4733 Obstructive sleep apnea (adult) (pediatric): Secondary | ICD-10-CM

## 2016-05-30 DIAGNOSIS — R0609 Other forms of dyspnea: Secondary | ICD-10-CM

## 2016-05-30 DIAGNOSIS — R0600 Dyspnea, unspecified: Secondary | ICD-10-CM

## 2016-05-30 DIAGNOSIS — M79662 Pain in left lower leg: Secondary | ICD-10-CM

## 2016-05-30 MED ORDER — UMECLIDINIUM-VILANTEROL 62.5-25 MCG/INH IN AEPB: 1.0000 | INHALATION_SPRAY | Freq: Every day | RESPIRATORY_TRACT | 10 refills | Status: DC

## 2016-05-30 MED ORDER — UMECLIDINIUM-VILANTEROL 62.5-25 MCG/INH IN AEPB: 1.0000 | INHALATION_SPRAY | Freq: Every day | RESPIRATORY_TRACT | 0 refills | Status: AC

## 2016-05-30 NOTE — Patient Instructions (Addendum)
1) for shortness of breath  Your lung function tests are essentially normal  We will try a change in inhaler from Advair to Anoro   One inhalation once a day  Continue albuterol inhaler as needed for shortness of breath  2) For sleep apnea  You test shows this to be mild sleep apnea  Since you are having lots of sleepiness, it should be treated  A CPAP titration study has been ordered  We will get CPAP started after the titration study  Follow up in 6 weeks

## 2016-06-01 NOTE — Progress Notes (Signed)
PULMONARY OFFICE FOLLOW UP NOTE  Requesting MD/Service: [name] Date of initial consultation: 04/10/16 Reason for consultation: COPD, OSA  PT PROFILE: 26 F former smoker with CAD, s/p CABG in 2012 referred by Dr [name] for mgmt of OSA and dyspnea. She was previosuly diagnosed with COPD but notably, PFTs in 2012 revealed no definite obstruction and mild decrease in DLCO.  DATA: 05/06/16 PSG: AHI 6.8/hr (during REM 11.4/hr). Lowest SpO2 87%. Poor sleep efficiency (52.5%) 05/16/16 PFTs: normal spirometry, normal lung volumes, mild reduction in DLCO  SUBJ: Here to review PSG and PFTs. Continues to have mod-severe DOE with some DTD variation. Also c/o persistent cough, nonproductive, and hoarseness. Denies CP, fever, purulent sputum, hemoptysis, LE edema and calf tenderness   OBJ:  Vitals:   05/30/16 0926  BP: 132/88  Pulse: 78  SpO2: 99%  Weight: 216 lb (98 kg)   EXAM:  Gen: obese, No overt respiratory distress HEENT: NCAT, sclera white, oropharynx normal Neck: Supple without LAN, thyromegaly, JVD Lungs: breath sounds full, No wheezes or crackles Cardiovascular: RRR, no murmurs noted Abdomen: Soft, nontender, normal BS Ext: without clubbing, cyanosis, edema Neuro: grossly intact  DATA:   CXR (05/16/16):  NACPD  IMPRESSION:     ICD-9-CM ICD-10-CM   1. DOE (dyspnea on exertion) 786.09 R06.09   2. OSA (obstructive sleep apnea) 327.23 G47.33   3. Nonproductive cough 786.2 R05   4. Hypersomnolence 780.54 G47.10    1) Exertional dyspnea - out of proportion to objective (PFT) findings. No real response to Advair. NP cough and hoarseness might be due to ICS 2) Mild OSA with moderate to severe daytime sleepiness  PLAN:  1) for dyspnea - PFTs are essentially normal  We will try a change in inhaler from Advair to Anoro  Continue albuterol inhaler as needed for shortness of breath  2) For sleep apnea - PSG reveals mild sleep apnea. She is having lots of daytime  Sleepiness.  Therefore, it should be treated. A CPAP titration study has been ordered. We will get CPAP started after the titration study  Follow up in 6 weeks   Merton Border, MD PCCM service Mobile [phone] Pager [phone] 06/01/2016

## 2016-06-06 ENCOUNTER — Ambulatory Visit
Admission: RE | Admit: 2016-06-06 | Discharge: 2016-06-06 | Disposition: A | Discharge status: Home / Self Care | Payer: Medicare Other | Source: Ambulatory Visit | Attending: Internal Medicine | Admitting: Internal Medicine

## 2016-06-06 DIAGNOSIS — M79662 Pain in left lower leg: Secondary | ICD-10-CM | POA: Diagnosis not present

## 2016-06-09 ENCOUNTER — Encounter: Payer: Self-pay | Admitting: Internal Medicine

## 2016-06-09 ENCOUNTER — Ambulatory Visit (INDEPENDENT_AMBULATORY_CARE_PROVIDER_SITE_OTHER): Payer: Medicare Other | Admitting: Cardiovascular Disease

## 2016-06-09 ENCOUNTER — Encounter: Payer: Self-pay | Admitting: Cardiovascular Disease

## 2016-06-09 VITALS — BP 124/80 | HR 79 | Ht 65.0 in | Wt 217.2 lb

## 2016-06-09 DIAGNOSIS — I2510 Atherosclerotic heart disease of native coronary artery without angina pectoris: Secondary | ICD-10-CM

## 2016-06-09 DIAGNOSIS — I255 Ischemic cardiomyopathy: Secondary | ICD-10-CM

## 2016-06-09 DIAGNOSIS — R0789 Other chest pain: Secondary | ICD-10-CM

## 2016-06-09 DIAGNOSIS — R05 Cough: Secondary | ICD-10-CM

## 2016-06-09 DIAGNOSIS — R0602 Shortness of breath: Secondary | ICD-10-CM

## 2016-06-09 DIAGNOSIS — I509 Heart failure, unspecified: Secondary | ICD-10-CM | POA: Diagnosis not present

## 2016-06-09 DIAGNOSIS — R059 Cough, unspecified: Secondary | ICD-10-CM

## 2016-06-09 DIAGNOSIS — I159 Secondary hypertension, unspecified: Secondary | ICD-10-CM

## 2016-06-09 NOTE — Progress Notes (Signed)
Cardiology Office Note  Date:  06/09/2016   ID:  [name], DOB 1960/12/21, MRN [id number]  PCP:  [name] [name], MD   No chief complaint on file.   HPI:  55 yo woman with a long smoking history,  h/o CAD s/p  NSTEMI at [facility] ([place]  1/12, with BMS to pLAD (80% stenosis), residual severe stenosis of the RCA which is a nondominant small vessel, With continued chest pain, repeat cardiac catheterization showing distal left main stenosis involving a high grade ostial LAD stenosis and circumflex disease approximately 90%, 90% diagonal disease, 80% nondominant RCA disease with ejection fraction 45%, who underwent bypass surgery x4 with a LIMA to the LAD, vein graft to the diagonal, vein graft to the marginal and vein graft to the RCA,  Also with peripheral vascular disease, S/p atherectomy and PTA of her right SFA on 01/15/11,  who presents for routine followup of her coronary artery disease CABG 2012  In follow-up today she reports that she is doing well Reports having Tests ordered by primary care for leg swelling, SOB Elevated d-dimer 1.2 LE doppler: no DVT Chest CTA scan: done at [facility], results unavailable was done this morning  Worried about low blood pressure On isosorbide BID, lisinopril, metoprolol Reports having low measurements done through primary care and other physician offices. Notes seem to indicate blood pressure 146/94 when she was seen by primary care  She reports having mild cough, no significant leg swelling   takes Lasix as needed   reports having diagnosis of OSA: Set up to get machine   denies any symptoms of chest pain, no angina Continued back pain, History of epidural shots , She did stop her Plavix prior to the procedure   no exercise program   Tolerating Crestor 40 mg daily and zetia Elevated ALT 110, now down to 50. Stayed on her statin labs reviewed from primary care   Other past medical history Prior  outpatient stress test  July 2013  showed significant breast attenuation artifact, inferior wall artifact from GI uptake. Overall no significant ischemia. History of chronic left arm and upper left chest  pain and was seen pain clinic. Prior diagnosis of carpal tunnel syndrome  occasional left breast pain radiating to her mediastinum.    Previously seen by ear nose throat for vestibular testing as a cause of her dizziness.  EGD and colonoscopy which by her report showed hiatal hernia. Prior problems with swallowing  She has had MRI of the neck showing cervical disc disease  Lab work shows total cholesterol 151, LDL 79, HDL 52, hemoglobin A1c 6.1  Echocardiogram in the hospital showed normal LV systolic function ejection fraction 50-55%, mild MR, mild to moderate TR with right ventricular systolic pressures [id number]  PMH:   has a past medical history of Anemia; Arthritis; Bacterial vaginitis; Blood in stool; Brachial neuritis or radiculitis NOS; Bronchitis; Cardiac arrhythmia; Cervical neck pain with evidence of disc disease; Chronic pain; Cocaine abuse, in remission; Congestive heart failure (HCC); COPD (chronic obstructive pulmonary disease) ([place]); Coronary artery disease; Depression; Family history of colon cancer; Generalized headaches; GERD (gastroesophageal reflux disease); Heart murmur; Hematochezia; History of cervical cancer; History of drug abuse; History of hepatitis B; History of kidney stones; History of MI (myocardial infarction); Hyperlipidemia; Hypertension; PAD (peripheral artery disease) ([place]); Polyp of colon; Seasonal allergies; Sleep apnea; Smoking history; Systolic CHF, chronic ([place]); Thyroid disease; and Urine incontinence.  PSH:    Past Surgical History:  Procedure Laterality Date  . CHOLECYSTECTOMY  [place]   3 polyps  . COLONOSCOPY WITH PROPOFOL N/A 02/06/2015   Procedure: COLONOSCOPY WITH PROPOFOL;  Surgeon: Lucilla Lame, MD;  Location: [facility] ENDOSCOPY;  Service: Endoscopy;  Laterality: N/A;   . CORONARY ANGIOPLASTY     w/ stent placement x2  . CORONARY ARTERY BYPASS GRAFT  2012   Dr [name]  . CORONARY STENT PLACEMENT  2003   S/P MI  . CORONARY STENT PLACEMENT  2007   [place]  . ESOPHAGOGASTRODUODENOSCOPY (EGD) WITH PROPOFOL N/A 02/06/2015   Procedure: ESOPHAGOGASTRODUODENOSCOPY (EGD) WITH PROPOFOL;  Surgeon: Lucilla Lame, MD;  Location: [facility] ENDOSCOPY;  Service: Endoscopy;  Laterality: N/A;  . FEMORAL ARTERY STENT  10/2010   right sided (Dr. Burt Knack)    Current Outpatient Prescriptions  Medication Sig Dispense Refill  . albuterol (PROVENTIL HFA;VENTOLIN HFA) 108 (90 Base) MCG/ACT inhaler Inhale 2 puffs into the lungs every 6 (six) hours as needed for wheezing or shortness of breath. 1 Inhaler 3  . aspirin 81 MG tablet Take 162 mg by mouth 2 (two) times daily.    . Biotin w/ Vitamins C & E (HAIR SKIN & NAILS GUMMIES PO) Take 2 tablets by mouth daily. Reported on 09/04/2015    . budesonide-formoterol (SYMBICORT) 160-4.5 MCG/ACT inhaler Inhale 2 puffs into the lungs 2 (two) times daily.    . cetirizine (ZYRTEC) 10 MG chewable tablet Chew 1 tablet (10 mg total) by mouth daily.    . CRESTOR 40 MG tablet TAKE ONE TABLET BY MOUTH ONCE DAILY 90 tablet 3  . cyclobenzaprine (FLEXERIL) 10 MG tablet Take 10 mg by mouth as needed.     [name] esomeprazole (NEXIUM) 40 MG capsule Reported on 09/04/2015    . ezetimibe (ZETIA) 10 MG tablet Take 1 tablet (10 mg total) by mouth daily. 90 tablet 4  . fluticasone (FLONASE) 50 MCG/ACT nasal spray Place 2 sprays into both nostrils daily. Reported on 09/04/2015    . furosemide (LASIX) 20 MG tablet TAKE ONE TABLET BY MOUTH ONCE DAILY FOR SWELLING. MAY TAKE ONE ADDITIONAL TABLET AS NEEDED FOR SWELLING/FLUID 180 tablet 3  . gabapentin (NEURONTIN) 300 MG capsule Take 300 mg by mouth at bedtime.    . isosorbide mononitrate (IMDUR) 30 MG 24 hr tablet TAKE ONE TABLET BY MOUTH TWICE DAILY 180 tablet 3  . KLOR-CON M10 10 MEQ tablet TAKE ONE TABLET BY MOUTH ONCE DAILY 90  tablet 3  . lisinopril (PRINIVIL,ZESTRIL) 20 MG tablet TAKE ONE TABLET BY MOUTH ONCE DAILY (Patient taking differently: TAKE ONE/HALF TABLET BY MOUTH ONCE DAILY) 90 tablet 3  . Magnesium 250 MG TABS Take 1 tablet by mouth daily as needed (leg cramps). Reported on 09/04/2015    . meclizine (ANTIVERT) 25 MG tablet Take 1 tablet (25 mg total) by mouth 2 (two) times daily. (Patient taking differently: Take 25 mg by mouth 3 (three) times daily. ) 30 tablet   . meloxicam (MOBIC) 15 MG tablet Take 1 tablet (15 mg total) by mouth daily.    . metFORMIN (GLUCOPHAGE) 500 MG tablet     . metoprolol tartrate (LOPRESSOR) 25 MG tablet TAKE ONE TABLET BY MOUTH TWICE DAILY 180 tablet 3  . Mouthwash Compounding Base (MOUTH WASH-GP PO) Take by mouth. Reported on 09/04/2015    . Multiple Vitamin (MULTIVITAMIN) tablet Take 1 tablet by mouth daily.    . nitroGLYCERIN (NITROSTAT) 0.4 MG SL tablet Place 1 tablet (0.4 mg total) under the tongue every 5 (five) minutes as needed.    [name]  NON FORMULARY cpap at bedtime    . PARoxetine (PAXIL) 40 MG tablet Take 40 mg by mouth every morning.    . potassium chloride (K-DUR) 10 MEQ tablet Take 1 tablet (10 mEq total) by mouth daily. 90 tablet 3  . traMADol (ULTRAM) 50 MG tablet Take 1 tablet (50 mg total) by mouth every 6 (six) hours as needed. 20 tablet 0  . umeclidinium-vilanterol (ANORO ELLIPTA) 62.5-25 MCG/INH AEPB Inhale 1 puff into the lungs daily. 60 each 10   No current facility-administered medications for this visit.      Allergies:   Latex; Shellfish allergy; and Sulfonamide derivatives   Social History:  The patient  reports that she quit smoking about 5 years ago. She has a 38.00 pack-year smoking history. She has never used smokeless tobacco. She reports that she does not drink alcohol or use drugs.   Family History:   family history includes Brain cancer in her sister; Breast cancer (age of onset: 68) in her mother; Colon cancer (age of onset: 28) in her father;  Diabetes in her father; Glaucoma in her father; Heart failure in her father; Hypertension in her father.    Review of Systems: Review of Systems  Constitutional: Negative.   Respiratory: Positive for cough.   Cardiovascular: Positive for leg swelling.  Gastrointestinal: Negative.   Musculoskeletal: Positive for back pain.  Neurological: Negative.   Psychiatric/Behavioral: Negative.   All other systems reviewed and are negative.    PHYSICAL EXAM: VS:  There were no vitals taken for this visit. , BMI There is no height or weight on file to calculate BMI. GEN: Well nourished, well developed, in no acute distress  HEENT: normal  Neck: no JVD, carotid bruits, or masses Cardiac: RRR; no murmurs, rubs, or gallops,no edema  Respiratory:  clear to auscultation bilaterally, normal work of breathing GI: soft, nontender, nondistended, + BS MS: no deformity or atrophy  Skin: warm and dry, no rash Neuro:  Strength and sensation are intact Psych: euthymic mood, full affect    Recent Labs: 10/20/2015: ALT 19; BUN 20; Creatinine, Ser 1.07; Hemoglobin 12.7; Platelets 203; Potassium 4.1; Sodium 138    Lipid Panel Lab Results  Component Value Date   CHOL 235 (H) 07/11/2011   HDL 58 07/11/2011   LDLCALC 149 (H) 07/11/2011   TRIG 140 07/11/2011      Wt Readings from Last 3 Encounters:  05/30/16 216 lb (98 kg)  04/10/16 216 lb 9.6 oz (98.2 kg)  04/01/16 216 lb 8 oz (98.2 kg)       ASSESSMENT AND PLAN:  CAD/ CABG:  Denies any symptoms concerning for angina, No further testing  Secondary hypertension, unspecified -  No changes made to her blood pressure, was mildly elevated when seen by primary care, adequate on today's visit.  Chest pain, unspecified chest pain type -  Currently with no symptoms of angina.  Continue current medication regimen. She is concerned about her cough, shortness of breath. Echocardiogram ordered to evaluate right heart pressures, ejection fraction  OSA  on CPAP  Previously seen by pulmonary Scheduled to be fitted for CPAP  COPD   stop smoking several years ago Reports chronic coughing. May have chronic diastolic CHF, cough somewhat better with Lasix    Total encounter time more than 25 minutes  Greater than 50% was spent in counseling and coordination of care with the patient   Disposition:   F/U  6 months    No orders of the defined types  were placed in this encounter.    Signed, Esmond Plants, M.D., Ph.D. 06/09/2016  [place], [place]

## 2016-06-09 NOTE — Patient Instructions (Addendum)
Medication Instructions:   Please move either the lisinopril or isosorbide to the evening  Please take lasix for a few days for cough  Labwork:  No new labs needed  Testing/Procedures:  We will order an echocardiogram for chest discomfort, shortness of breath, cough Echocardiography is a painless test that uses sound waves to create images of your heart. It provides your doctor with information about the size and shape of your heart and how well your heart's chambers and valves are working. This procedure takes approximately one hour. There are no restrictions for this procedure.  I recommend watching educational videos on topics of interest to you at:       [email or web address]  Enter code: [email or web address]    Follow-Up: It was a pleasure seeing you in the office today. Please call us if you have new issues that need to be addressed before your next appt.  [phone]  Your physician wants you to follow-up in: 6 months.  You will receive a reminder letter in the mail two months in advance. If you don't receive a letter, please call our office to schedule the follow-up appointment.  If you need a refill on your cardiac medications before your next appointment, please call your pharmacy.      Echocardiogram An echocardiogram, or echocardiography, uses sound waves (ultrasound) to produce an image of your heart. The echocardiogram is simple, painless, obtained within a short period of time, and offers valuable information to your health care provider. The images from an echocardiogram can provide information such as:  Evidence of coronary artery disease (CAD).  Heart size.  Heart muscle function.  Heart valve function.  Aneurysm detection.  Evidence of a past heart attack.  Fluid buildup around the heart.  Heart muscle thickening.  Assess heart valve function. LET [facility] CARE PROVIDER KNOW ABOUT:  Any allergies you have.  All medicines you are taking, including  vitamins, herbs, eye drops, creams, and over-the-counter medicines.  Previous problems you or members of your family have had with the use of anesthetics.  Any blood disorders you have.  Previous surgeries you have had.  Medical conditions you have.  Possibility of pregnancy, if this applies. BEFORE THE PROCEDURE  No special preparation is needed. Eat and drink normally.  PROCEDURE   In order to produce an image of your heart, gel will be applied to your chest and a wand-like tool (transducer) will be moved over your chest. The gel will help transmit the sound waves from the transducer. The sound waves will harmlessly bounce off your heart to allow the heart images to be captured in real-time motion. These images will then be recorded.  You may need an IV to receive a medicine that improves the quality of the pictures. AFTER THE PROCEDURE You may return to your normal schedule including diet, activities, and medicines, unless your health care provider tells you otherwise.   This information is not intended to replace advice given to you by your health care provider. Make sure you discuss any questions you have with your health care provider.   Document Released: 07/11/2000 Document Revised: 08/04/2014 Document Reviewed: 03/21/2013 Elsevier Interactive Patient Education [employer].

## 2016-06-12 ENCOUNTER — Ambulatory Visit: Payer: Medicare Other | Attending: Internal Medicine

## 2016-06-12 DIAGNOSIS — I739 Peripheral vascular disease, unspecified: Secondary | ICD-10-CM | POA: Diagnosis not present

## 2016-06-12 DIAGNOSIS — G4733 Obstructive sleep apnea (adult) (pediatric): Secondary | ICD-10-CM

## 2016-06-12 DIAGNOSIS — Z951 Presence of aortocoronary bypass graft: Secondary | ICD-10-CM | POA: Insufficient documentation

## 2016-06-12 DIAGNOSIS — R0989 Other specified symptoms and signs involving the circulatory and respiratory systems: Secondary | ICD-10-CM | POA: Insufficient documentation

## 2016-06-12 DIAGNOSIS — Z1231 Encounter for screening mammogram for malignant neoplasm of breast: Secondary | ICD-10-CM

## 2016-06-18 ENCOUNTER — Other Ambulatory Visit: Payer: Self-pay | Admitting: Internal Medicine

## 2016-06-18 DIAGNOSIS — R2232 Localized swelling, mass and lump, left upper limb: Secondary | ICD-10-CM

## 2016-06-19 DIAGNOSIS — G4733 Obstructive sleep apnea (adult) (pediatric): Secondary | ICD-10-CM

## 2016-06-23 ENCOUNTER — Ambulatory Visit: Payer: Medicare Other

## 2016-06-24 ENCOUNTER — Telehealth: Payer: Self-pay | Admitting: *Deleted

## 2016-06-24 DIAGNOSIS — G4733 Obstructive sleep apnea (adult) (pediatric): Secondary | ICD-10-CM

## 2016-06-24 NOTE — Telephone Encounter (Signed)
LMOM for pt to return call. 

## 2016-06-24 NOTE — Telephone Encounter (Signed)
-----   Message from Laverle Hobby, MD sent at 06/19/2016 12:04 PM EST ----- Regarding: sleep study CPAP titration reviewed; pt required a CPAP of 10  CmH2O.

## 2016-06-25 NOTE — Telephone Encounter (Signed)
Pt informed of titration results. Order placed for CPAP.

## 2016-07-03 ENCOUNTER — Other Ambulatory Visit: Payer: Self-pay

## 2016-07-03 ENCOUNTER — Ambulatory Visit (INDEPENDENT_AMBULATORY_CARE_PROVIDER_SITE_OTHER): Payer: Medicare Other

## 2016-07-03 ENCOUNTER — Ambulatory Visit: Payer: Medicare Other

## 2016-07-03 DIAGNOSIS — I159 Secondary hypertension, unspecified: Secondary | ICD-10-CM

## 2016-07-03 DIAGNOSIS — I509 Heart failure, unspecified: Secondary | ICD-10-CM | POA: Diagnosis not present

## 2016-07-03 DIAGNOSIS — R0602 Shortness of breath: Secondary | ICD-10-CM

## 2016-07-03 DIAGNOSIS — R0789 Other chest pain: Secondary | ICD-10-CM

## 2016-07-03 DIAGNOSIS — R05 Cough: Secondary | ICD-10-CM

## 2016-07-03 DIAGNOSIS — R059 Cough, unspecified: Secondary | ICD-10-CM

## 2016-07-03 DIAGNOSIS — I2510 Atherosclerotic heart disease of native coronary artery without angina pectoris: Secondary | ICD-10-CM | POA: Diagnosis not present

## 2016-07-04 ENCOUNTER — Ambulatory Visit: Payer: Medicare Other

## 2016-07-11 ENCOUNTER — Ambulatory Visit (INDEPENDENT_AMBULATORY_CARE_PROVIDER_SITE_OTHER): Payer: Medicare Other | Admitting: Pulmonary Disease

## 2016-07-11 ENCOUNTER — Encounter: Payer: Self-pay | Admitting: Pulmonary Disease

## 2016-07-11 VITALS — BP 122/78 | HR 80 | Ht 65.0 in | Wt 211.0 lb

## 2016-07-11 DIAGNOSIS — R0609 Other forms of dyspnea: Secondary | ICD-10-CM | POA: Diagnosis not present

## 2016-07-11 DIAGNOSIS — G4733 Obstructive sleep apnea (adult) (pediatric): Secondary | ICD-10-CM | POA: Diagnosis not present

## 2016-07-11 DIAGNOSIS — R0600 Dyspnea, unspecified: Secondary | ICD-10-CM

## 2016-07-11 DIAGNOSIS — I255 Ischemic cardiomyopathy: Secondary | ICD-10-CM | POA: Diagnosis not present

## 2016-07-11 NOTE — Progress Notes (Signed)
PULMONARY OFFICE FOLLOW UP NOTE  Requesting MD/Service: [name] Date of initial consultation: 04/10/16 Reason for consultation: COPD, OSA  PT PROFILE: 72 F former smoker with CAD, s/p CABG in 2012 referred by Dr [name] for mgmt of OSA and dyspnea. She was previosuly diagnosed with COPD but notably, PFTs in 2012 revealed no definite obstruction and mild decrease in DLCO.  DATA: 05/06/16 PSG: AHI 6.8/hr (during REM 11.4/hr). Lowest SpO2 87%. Poor sleep efficiency (52.5%) 05/16/16 PFTs: normal spirometry, normal lung volumes, mild reduction in DLCO 06/09/16 CTA chest: ordered by Dr [name] for CP, dyspnea. Normal study. No PE 06/24/16 CPAP titration: optimal CPAP level 10 cm H2O  SUBJ: Dyspnea is much improved on Anoro. No new respiratory complaints. Denies CP, fever, purulent sputum, hemoptysis, LE edema and calf tenderness  She had CPAP titration study done and CPAP of 10 cm H2O was recommended. She notes that she felt remarkably well rested after the titration study. However, she has not yet has CPAP initiated. She has an appointment with [facility] 08/05/16. Continues to have daytime hypersomnolence without change from previous   OBJ:  Vitals:   07/11/16 0845  BP: 122/78  Pulse: 80  SpO2: 98%  Weight: 211 lb (95.7 kg)  Height: 5\' 5"  (1.651 m)   EXAM:  Gen: obese, No respiratory distress HEENT: NCAT, sclera white, oropharynx normal Lungs: breath sounds full, No wheezes  Cardiovascular: Reg, no murmurs  Abdomen: Soft, nontender, normal BS Ext: without clubbing, cyanosis, edema Neuro: grossly intact  DATA:  CXR: NNF  IMPRESSION:     ICD-9-CM ICD-10-CM   1. DOE (dyspnea on exertion) 786.09 R06.09   2. OSA (obstructive sleep apnea) 327.23 G47.33    1) Former smoker, exertional dyspnea, no COPD by PFTs - Improved with Anoro suggesting a component of airway hyperreactivity (though she failed to improve significantly with Advair previously).  2) Mild OSA with moderate  to severe daytime sleepiness  PLAN:  1) Continue Anoro inhaler - one inhalation daily 2) Get CPAP initiated as soon as possible - appointment scheduled with [facility]  Follow up in 3 months   Merton Border, MD PCCM service Mobile [phone] Pager [phone] 07/11/2016

## 2016-07-11 NOTE — Patient Instructions (Signed)
1) Continue Anoro inhaler each day 2) Get CPAP setup from [place] 3) Follow up in 3 months or as needed

## 2016-07-15 ENCOUNTER — Ambulatory Visit
Admission: RE | Admit: 2016-07-15 | Discharge: 2016-07-15 | Disposition: A | Discharge status: Home / Self Care | Payer: Medicare Other | Source: Ambulatory Visit | Attending: Internal Medicine | Admitting: Internal Medicine

## 2016-07-15 DIAGNOSIS — R2232 Localized swelling, mass and lump, left upper limb: Secondary | ICD-10-CM | POA: Insufficient documentation

## 2016-08-01 ENCOUNTER — Ambulatory Visit: Payer: Medicare Other

## 2016-08-12 ENCOUNTER — Other Ambulatory Visit: Payer: Self-pay | Admitting: Cardiovascular Disease

## 2016-08-17 ENCOUNTER — Encounter: Payer: Self-pay | Admitting: Internal Medicine

## 2016-08-29 ENCOUNTER — Encounter: Payer: Self-pay | Admitting: Internal Medicine

## 2016-09-02 ENCOUNTER — Encounter: Payer: Self-pay | Admitting: Pulmonary Disease

## 2016-09-02 LAB — HM HIV SCREENING LAB: HM HIV SCREENING: NEGATIVE

## 2016-09-05 ENCOUNTER — Ambulatory Visit
Admission: RE | Admit: 2016-09-05 | Discharge: 2016-09-05 | Disposition: A | Discharge status: Home / Self Care | Payer: Medicare Other | Source: Ambulatory Visit | Attending: Internal Medicine | Admitting: Internal Medicine

## 2016-09-05 DIAGNOSIS — Z1231 Encounter for screening mammogram for malignant neoplasm of breast: Secondary | ICD-10-CM | POA: Diagnosis not present

## 2016-10-13 ENCOUNTER — Ambulatory Visit: Payer: Medicare Other | Admitting: Pulmonary Disease

## 2016-10-30 ENCOUNTER — Encounter: Payer: Self-pay | Admitting: Pulmonary Disease

## 2016-10-30 ENCOUNTER — Ambulatory Visit (INDEPENDENT_AMBULATORY_CARE_PROVIDER_SITE_OTHER): Payer: Medicare Other | Admitting: Pulmonary Disease

## 2016-10-30 VITALS — BP 122/68 | HR 82 | Wt 213.0 lb

## 2016-10-30 DIAGNOSIS — Z87891 Personal history of nicotine dependence: Secondary | ICD-10-CM | POA: Diagnosis not present

## 2016-10-30 DIAGNOSIS — G4733 Obstructive sleep apnea (adult) (pediatric): Secondary | ICD-10-CM | POA: Diagnosis not present

## 2016-10-30 DIAGNOSIS — R0600 Dyspnea, unspecified: Secondary | ICD-10-CM

## 2016-10-30 DIAGNOSIS — J301 Allergic rhinitis due to pollen: Secondary | ICD-10-CM | POA: Diagnosis not present

## 2016-10-30 DIAGNOSIS — R0609 Other forms of dyspnea: Secondary | ICD-10-CM | POA: Diagnosis not present

## 2016-10-30 NOTE — Patient Instructions (Addendum)
Continue CPAP. You need to be more compliant with this to retain the machine Referral to mask fit clinic in [facility] and continue albuterol as needed Suggest Benadryl 25 mg 1 hour before bedtime - this may be purchased over-the-counter Follow-up in 6 weeks to reassess compliance with CPAP

## 2016-10-30 NOTE — Progress Notes (Signed)
PULMONARY OFFICE FOLLOW UP NOTE  Requesting MD/Service: [name] Date of initial consultation: 04/10/16 Reason for consultation: COPD, OSA  PT PROFILE: 89 F former smoker with CAD, s/p CABG in 2012 referred by Dr [name] for mgmt of OSA and dyspnea. She was previosuly diagnosed with COPD but notably, PFTs in 2012 revealed no definite obstruction and mild decrease in DLCO.  DATA: 05/06/16 PSG: AHI 6.8/hr (during REM 11.4/hr). Lowest SpO2 87%. Poor sleep efficiency (52.5%) 05/16/16 PFTs: normal spirometry, normal lung volumes, mild reduction in DLCO 06/09/16 CTA chest: ordered by Dr [name] for CP, dyspnea. Normal study. No PE 06/24/16 CPAP titration: optimal CPAP level 10 cm H2O 09/29/16-10/28/16 compliance report: 13/30 nights. > 4 hrs 13 nights  SUBJ: This is a routine reevaluation for dyspnea and obstructive sleep apnea. Previously, she was compliant with CPAP and felt that it was beneficial. However, over the last month or so, she has had increasing difficulty with seasonal allergic rhinitis and nasal congestion. Therefore she has been unable to tolerate the CPAP. She also reports some difficulty with the CPAP mask fit.   Her shortness of breath is at baseline and she continues to believe that she benefits from Anoro. She is using her albuterol inhaler once a day "on a schedule"   OBJ:  Vitals:   10/30/16 0904 10/30/16 0905  BP:  122/68  Pulse:  82  SpO2:  100%  Weight: 96.6 kg (213 lb)   RA  EXAM:  Gen: obese, No respiratory distress HEENT: NCAT, sclera white, Tympanic canals and membranes are normal, nasal mucosa is mildly erythematous with mild mucoid secretions in both nares, oropharynx normal Lungs: breath sounds full, No wheezes  Cardiovascular: Reg, no murmurs  Abdomen: Soft, nontender, normal BS Ext: without clubbing, cyanosis, edema Neuro: grossly intact  DATA:  CXR: NNF  IMPRESSION:     ICD-9-CM ICD-10-CM   1. OSA (obstructive sleep apnea) 327.23 G47.33  Desensitization mask fit  2. Former smoker V15.82 Z87.891   3. Exertional dyspnea 786.09 R06.09   4. Morbid obesity ([place]) 278.01 E66.01   5. Acute seasonal allergic rhinitis due to pollen 477.0 J30.1     PLAN:  Continue CPAP. She needs to be more compliant with this to retain the machine Referral to mask fit clinic in [facility] and continue albuterol as needed Suggested Benadryl 25 mg 1 hour before bedtime - this may be purchased over-the-counter Follow-up in 6 weeks to reassess compliance with CPAP   Merton Border, MD PCCM service Mobile [phone] Pager [phone] 10/30/2016 9:34 AM

## 2016-11-24 ENCOUNTER — Encounter: Payer: Self-pay | Admitting: Obstetrics and Gynecology

## 2016-11-24 ENCOUNTER — Other Ambulatory Visit: Payer: Self-pay | Admitting: Cardiovascular Disease

## 2016-11-24 ENCOUNTER — Ambulatory Visit (INDEPENDENT_AMBULATORY_CARE_PROVIDER_SITE_OTHER): Payer: Medicare Other | Admitting: Obstetrics and Gynecology

## 2016-11-24 VITALS — BP 130/88 | HR 82 | Ht 65.0 in | Wt 212.0 lb

## 2016-11-24 DIAGNOSIS — R232 Flushing: Secondary | ICD-10-CM | POA: Insufficient documentation

## 2016-11-24 HISTORY — DX: Flushing: R23.2

## 2016-11-24 MED ORDER — GABAPENTIN (ONCE-DAILY) 300 MG PO TABS: 300.0000 mg | ORAL_TABLET | Freq: Three times a day (TID) | ORAL | 2 refills | Status: DC

## 2016-11-24 NOTE — Progress Notes (Signed)
Obstetrics & Gynecology Office Visit   Chief Complaint  Patient presents with  . Follow-up    6 month f/u post menopausal bleeding    History of Present Illness: 56 y.o. [id number] female who presents with hot flashes. They have been present for 5 years, but have become more severe in the past couple years. Now she has them daily and they seem to last for hours. She is fully soaked with sweat. She denies any other symptoms.  She has seen her PCP about this.  No other concerning symptoms.    Review of Systems: Review of Systems  Constitutional: Positive for weight loss (she has been trying to lose weight and has done so with some success). Negative for malaise/fatigue.       Hot flashes  HENT: Negative.   Eyes: Negative.   Respiratory: Negative.   Cardiovascular: Negative.   Gastrointestinal: Negative.   Genitourinary: Negative.   Musculoskeletal: Negative.   Skin: Negative.   Neurological: Negative.  Negative for weakness.  Endo/Heme/Allergies: Negative.   Psychiatric/Behavioral: Negative.     Past Medical History:  Diagnosis Date  . Anemia   . Arthritis   . Bacterial vaginitis   . Blood in stool   . Brachial neuritis or radiculitis NOS   . Bronchitis   . Cardiac arrhythmia   . Cervical neck pain with evidence of disc disease    C5/6 disease, MRI done late 2012 - no records available  . Chronic pain   . Cocaine abuse, in remission    clean x 24 years  . Congestive heart failure ([place])   . COPD (chronic obstructive pulmonary disease) ([place])   . Coronary artery disease    PCI of left circumflex 2003, PCI of the LAD 2012 with a bare-metal stent (2.5 x 8 mm);   s/p CABG 4/12:  L-LAD, S-Dx, S-OM, S-RCA (Dr. Prescott Gum)  . Depression   . Family history of colon cancer   . Generalized headaches    frequent  . GERD (gastroesophageal reflux disease)   . Heart murmur   . Hematochezia   . History of cervical cancer    s/p cryotherapy  . History of drug abuse    cocaine,  marijuana, clean since 1989  . History of hepatitis B    from eating undercooked liver  . History of kidney stones   . History of MI (myocardial infarction)   . Hyperlipidemia   . Hypertension   . PAD (peripheral artery disease) ([facility])    s/p Right SFA atherectomy and PTA 01/15/11, normal ABIs 01/2011  . Polyp of colon   . Seasonal allergies   . Sleep apnea   . Smoking history    quit 07/2010  . Systolic CHF, chronic ([facility])    mild: echo 08/2010 - mildly reduced EF 40-45%, mild diffuse hypokinesis  . Thyroid disease   . Urine incontinence     Past Surgical History:  Procedure Laterality Date  . CHOLECYSTECTOMY  1980  . COLONOSCOPY  2008   3 polyps  . COLONOSCOPY WITH PROPOFOL N/A 02/06/2015   Procedure: COLONOSCOPY WITH PROPOFOL;  Surgeon: Lucilla Lame, MD;  Location: [facility] ENDOSCOPY;  Service: Endoscopy;  Laterality: N/A;  . CORONARY ANGIOPLASTY     w/ stent placement x2  . CORONARY ARTERY BYPASS GRAFT  2012   Dr [name]  . CORONARY STENT PLACEMENT  2003   S/P MI  . CORONARY STENT PLACEMENT  2007   [place]  . ESOPHAGOGASTRODUODENOSCOPY (EGD) WITH PROPOFOL N/A 02/06/2015  Procedure: ESOPHAGOGASTRODUODENOSCOPY (EGD) WITH PROPOFOL;  Surgeon: Lucilla Lame, MD;  Location: [facility] ENDOSCOPY;  Service: Endoscopy;  Laterality: N/A;  . FEMORAL ARTERY STENT  10/2010   right sided (Dr. Burt Knack)    Gynecologic History: s/p hysterectomy based on imaging. Patient has no recollection of this event.   Obstetric History: [id number]  Family History  Problem Relation Age of Onset  . Hypertension Father   . Heart failure Father   . Diabetes Father   . Colon cancer Father 25  . Glaucoma Father   . Breast cancer Mother 33    breast cancer, late 39's  . Brain cancer Sister   . Coronary artery disease Neg Hx   . Stroke Neg Hx     Social History   Social History  . Marital status: Legally Separated    Spouse name: N/A  . Number of children: N/A  . Years of education: N/A   Occupational History   . Not on file.   Social History Main Topics  . Smoking status: Former Smoker    Packs/day: 1.00    Years: 38.00    Quit date: 08/12/2010  . Smokeless tobacco: Never Used  . Alcohol use No  . Drug use: No     Comment: Remote Hx (crack cocaine and marijuana)  . Sexual activity: Yes   Other Topics Concern  . Not on file   Social History Narrative   Caffeine: 1 cup coffee/day   Lives alone, no pets   Occupation: industrial work, prior [employer], in process of getting disability, initially denied   Edu: 11th grade   Activity: no regular exercise   Diet: good water, vegetables daily, low salt diet    Allergies  Allergen Reactions  . Latex     Rash   . Shellfish Allergy     Hard shellfish/swelling in throat  . Sulfonamide Derivatives     Swelling in throat.    Medications:   Medication Sig Start Date End Date Taking? Authorizing Provider  albuterol (PROVENTIL HFA;VENTOLIN HFA) 108 (90 Base) MCG/ACT inhaler Inhale 2 puffs into the lungs every 6 (six) hours as needed for wheezing or shortness of breath. 04/10/16   Wilhelmina Mcardle, MD  aspirin 81 MG tablet Take 162 mg by mouth 2 (two) times daily.    Historical Provider, MD  Azelastine HCl 0.15 % SOLN  10/31/16   Historical Provider, MD  Biotin w/ Vitamins C & E (HAIR SKIN & NAILS GUMMIES PO) Take 2 tablets by mouth daily. Reported on 09/04/2015    Historical Provider, MD  cetirizine (ZYRTEC) 10 MG chewable tablet Chew 1 tablet (10 mg total) by mouth daily. 01/20/14   Minna Merritts, MD  esomeprazole (NEXIUM) 40 MG capsule Reported on 09/04/2015 05/31/15   Historical Provider, MD  ezetimibe (ZETIA) 10 MG tablet TAKE ONE TABLET BY MOUTH ONCE DAILY 11/24/16   Minna Merritts, MD  fluticasone (FLONASE) 50 MCG/ACT nasal spray Place 2 sprays into both nostrils daily. Reported on 09/04/2015    Historical Provider, MD  furosemide (LASIX) 20 MG tablet TAKE ONE TABLET BY MOUTH ONCE DAILY FOR SWELLING. MAY TAKE ONE ADDITIONAL TABLET AS NEEDED FOR  SWELLING/FLUID 09/25/15   Minna Merritts, MD  isosorbide mononitrate (IMDUR) 30 MG 24 hr tablet TAKE ONE TABLET BY MOUTH TWICE DAILY 05/26/16   Minna Merritts, MD  KLOR-CON M10 10 MEQ tablet TAKE ONE TABLET BY MOUTH ONCE DAILY 08/12/16   Minna Merritts, MD  Magnesium 250 MG TABS  Take 1 tablet by mouth daily as needed (leg cramps). Reported on 09/04/2015    Historical Provider, MD  meclizine (ANTIVERT) 25 MG tablet Take 1 tablet (25 mg total) by mouth 2 (two) times daily. Patient taking differently: Take 25 mg by mouth 3 (three) times daily.  01/20/14   Minna Merritts, MD  meloxicam (MOBIC) 15 MG tablet Take 1 tablet (15 mg total) by mouth daily. 01/20/14   Minna Merritts, MD  metFORMIN (GLUCOPHAGE) 500 MG tablet  08/30/15   Historical Provider, MD  metoprolol tartrate (LOPRESSOR) 25 MG tablet TAKE ONE TABLET BY MOUTH TWICE DAILY 04/30/15   Minna Merritts, MD  Multiple Vitamin (MULTIVITAMIN) tablet Take 1 tablet by mouth daily. 01/20/14   Minna Merritts, MD  nitroGLYCERIN (NITROSTAT) 0.4 MG SL tablet Place 1 tablet (0.4 mg total) under the tongue every 5 (five) minutes as needed. 01/20/14   Minna Merritts, MD  NON FORMULARY cpap at bedtime    Historical Provider, MD  PARoxetine (PAXIL) 40 MG tablet Take 40 mg by mouth every morning.    Historical Provider, MD  PENNSAID 2 % SOLN  11/13/16   Historical Provider, MD  rosuvastatin (CRESTOR) 20 MG tablet  10/31/16   Historical Provider, MD  triamcinolone cream (KENALOG) 0.1 %  10/31/16   Historical Provider, MD  umeclidinium-vilanterol (ANORO ELLIPTA) 62.5-25 MCG/INH AEPB Inhale 1 puff into the lungs daily. 05/30/16   Wilhelmina Mcardle, MD    Physical Exam BP 130/88   Pulse 82   Ht _0  (1.651 m)   Wt 212 lb (96.2 kg)   BMI 35.28 kg/m  No LMP recorded. Patient has had an ablation. Physical Exam  Constitutional: She is oriented to person, place, and time and well-developed, well-nourished, and in no distress. No distress.  HENT:  Head:  Normocephalic and atraumatic.  Eyes: Conjunctivae are normal. No scleral icterus.  Neck: Normal range of motion. Neck supple. No thyromegaly present.  Cardiovascular: Normal rate and regular rhythm.   Pulmonary/Chest: Effort normal and breath sounds normal. No respiratory distress. She has no wheezes. She has no rales.  Abdominal: Soft. Bowel sounds are normal. She exhibits no distension and no mass. There is no tenderness. There is no rebound and no guarding.  Musculoskeletal: Normal range of motion. She exhibits no tenderness.  Lymphadenopathy:    She has no cervical adenopathy.  Neurological: She is alert and oriented to person, place, and time. No cranial nerve deficit.  Skin: Skin is warm and dry. No rash noted. She is not diaphoretic.  Psychiatric: Mood, affect and judgment normal.     Assessment: 56 y.o. [id number] with vasomotor symptoms associated with menopause.  She is not a candidate for estrogen due to her myriad medical conditions. We discussed this. Since she appears to be on paroxetine already, will start with gabapentin.  Discussed that noticing an effect could take several months.  We also discussed that it could take trying a few different medications.   Plan: Since she has contraindications for estrogen hormonal therapy, will start with gabapentin 373m daily increasing by 300 mg every 7 days until she is at 900 mg daily (in 3 divided doses) Follow up in 2 months. But she understands that realizing the full benefit of symptom relief may take several months.  SPrentice Docker MD 11/24/2016 12:56 PM

## 2016-12-02 ENCOUNTER — Other Ambulatory Visit (HOSPITAL_BASED_OUTPATIENT_CLINIC_OR_DEPARTMENT_OTHER): Payer: Medicare Other

## 2016-12-07 NOTE — Progress Notes (Signed)
Cardiology Office Note  Date:  12/09/2016   ID:  [name], DOB 04-01-1961, MRN [id number]  PCP:  [name], [name], MD   Chief Complaint  Patient presents with  . OTHER    6 month f/u c/o legs and feet swelling. Meds reviewed verbally with pt.    HPI:  56 yo woman with a  long smoking history,   CAD s/p  NSTEMI at [facility]  1/12, with BMS to pLAD (80% stenosis),  residual severe stenosis of the RCA/nondominant small vessel,  continued chest pain,  cardiac catheterization showing distal left main stenosis involving a high grade ostial LAD stenosis and circumflex disease approximately 90%, 90% diagonal disease, 80% nondominant RCA disease with ejection fraction 45%,  CABG x 4 in 2012 with a LIMA to the LAD, vein graft to the diagonal, vein graft to the marginal and vein graft to the RCA,   peripheral vascular disease,  S/p atherectomy and PTA of her right SFA on 01/15/11,   outpatient stress test  July 2013 , OSA who presents for routine followup of her coronary artery disease  Echocardiogram December 2017 with ejection fraction 50% mild to moderate TR normal right heart pressures  In follow-up today she reports that she is doing well She reports having some leg swelling exacerbated by the heat She does not take Lasix daily, takes as needed for ankle swelling Recently had to take 2 Lasix per day for swelling and chest fullness with improvement of her symptoms Now with leg cramps, occasional dizziness She is taking her potassium and magnesium  Reports she is scheduled for total knee replacement but this has been put on hold secondary to blood in her stool and leg swelling issues  She reports having Blood in stool, see Dr. Allen Norris for EGD 01/01/2017  Headaches, has MRI scheduled "Cholesterol high", compliant with her Crestor 20 and Zetia  Previous Chest CTA scan: done at [facility]   reports having diagnosis of OSA   denies any symptoms of chest pain, no  angina Continued back pain, History of epidural shots   no exercise program   EKG personally reviewed by myself on todays visit Shows normal sinus rhythm with rate 73 bpm T-wave abnormality precordial leads  Other past medical history Prior  outpatient stress test  July 2013 showed significant breast attenuation artifact, inferior wall artifact from GI uptake. Overall no significant ischemia. History of chronic left arm and upper left chest  pain and was seen pain clinic. Prior diagnosis of carpal tunnel syndrome  occasional left breast pain radiating to her mediastinum.    Previously seen by ear nose throat for vestibular testing as a cause of her dizziness.  EGD and colonoscopy which by her report showed hiatal hernia. Prior problems with swallowing  She has had MRI of the neck showing cervical disc disease  Lab work shows total cholesterol 151, LDL 79, HDL 52, hemoglobin A1c 6.1  Echocardiogram in the hospital showed normal LV systolic function ejection fraction 50-55%, mild MR, mild to moderate TR with right ventricular systolic pressures 14-43  PMH:   has a past medical history of Anemia; Arthritis; Bacterial vaginitis; Blood in stool; Brachial neuritis or radiculitis NOS; Bronchitis; Cardiac arrhythmia; Cervical neck pain with evidence of disc disease; Chronic pain; Cocaine abuse, in remission; Congestive heart failure (HCC); COPD (chronic obstructive pulmonary disease) ([place]); Coronary artery disease; Depression; Family history of colon cancer; Generalized headaches; GERD (gastroesophageal reflux disease); Heart murmur; Hematochezia; History of cervical cancer; History of drug  abuse; History of hepatitis B; History of kidney stones; History of MI (myocardial infarction); Hyperlipidemia; Hypertension; PAD (peripheral artery disease) ([place]); Polyp of colon; Seasonal allergies; Sleep apnea; Smoking history; Systolic CHF, chronic ([place]); Thyroid disease; and Urine incontinence.  PSH:    Past  Surgical History:  Procedure Laterality Date  . CHOLECYSTECTOMY  1980  . COLONOSCOPY  2008   3 polyps  . COLONOSCOPY WITH PROPOFOL N/A 02/06/2015   Procedure: COLONOSCOPY WITH PROPOFOL;  Surgeon: Lucilla Lame, MD;  Location: [facility] ENDOSCOPY;  Service: Endoscopy;  Laterality: N/A;  . CORONARY ANGIOPLASTY     w/ stent placement x2  . CORONARY ARTERY BYPASS GRAFT  2012   Dr [name]  . CORONARY STENT PLACEMENT  2003   S/P MI  . CORONARY STENT PLACEMENT  2007   [place]  . ESOPHAGOGASTRODUODENOSCOPY (EGD) WITH PROPOFOL N/A 02/06/2015   Procedure: ESOPHAGOGASTRODUODENOSCOPY (EGD) WITH PROPOFOL;  Surgeon: Lucilla Lame, MD;  Location: [facility] ENDOSCOPY;  Service: Endoscopy;  Laterality: N/A;  . FEMORAL ARTERY STENT  10/2010   right sided (Dr. Burt Knack)    Current Outpatient Prescriptions  Medication Sig Dispense Refill  . albuterol (PROVENTIL HFA;VENTOLIN HFA) 108 (90 Base) MCG/ACT inhaler Inhale 2 puffs into the lungs every 6 (six) hours as needed for wheezing or shortness of breath. 1 Inhaler 3  . aspirin 81 MG tablet Take 162 mg by mouth 2 (two) times daily.    . Azelastine HCl 0.15 % SOLN     . Biotin w/ Vitamins C & E (HAIR SKIN & NAILS GUMMIES PO) Take 2 tablets by mouth daily. Reported on 09/04/2015    . cetirizine (ZYRTEC) 10 MG chewable tablet Chew 1 tablet (10 mg total) by mouth daily.    [name] esomeprazole (NEXIUM) 40 MG capsule Reported on 09/04/2015    . ezetimibe (ZETIA) 10 MG tablet TAKE ONE TABLET BY MOUTH ONCE DAILY 90 tablet 3  . fluticasone (FLONASE) 50 MCG/ACT nasal spray Place 2 sprays into both nostrils daily. Reported on 09/04/2015    . furosemide (LASIX) 20 MG tablet TAKE ONE TABLET BY MOUTH ONCE DAILY FOR SWELLING. MAY TAKE ONE ADDITIONAL TABLET AS NEEDED FOR SWELLING/FLUID 180 tablet 3  . Gabapentin, Once-Daily, 300 MG TABS Take 300 mg by mouth 3 (three) times daily. Start with 1 pill week 1, increase to 2 pills week 2, then take 3 pills daily 180 tablet 2  . isosorbide mononitrate (IMDUR)  30 MG 24 hr tablet TAKE ONE TABLET BY MOUTH TWICE DAILY 180 tablet 3  . KLOR-CON M10 10 MEQ tablet TAKE ONE TABLET BY MOUTH ONCE DAILY 90 tablet 3  . Magnesium 250 MG TABS Take 1 tablet by mouth daily as needed (leg cramps). Reported on 09/04/2015    . meclizine (ANTIVERT) 25 MG tablet Take 1 tablet (25 mg total) by mouth 2 (two) times daily. (Patient taking differently: Take 25 mg by mouth 3 (three) times daily. ) 30 tablet   . meloxicam (MOBIC) 15 MG tablet Take 1 tablet (15 mg total) by mouth daily.    . metFORMIN (GLUCOPHAGE) 500 MG tablet     . metoprolol tartrate (LOPRESSOR) 25 MG tablet TAKE ONE TABLET BY MOUTH TWICE DAILY 180 tablet 3  . Multiple Vitamin (MULTIVITAMIN) tablet Take 1 tablet by mouth daily.    . nitroGLYCERIN (NITROSTAT) 0.4 MG SL tablet Place 1 tablet (0.4 mg total) under the tongue every 5 (five) minutes as needed.    . NON FORMULARY cpap at bedtime    . PARoxetine (  PAXIL) 40 MG tablet Take 40 mg by mouth every morning.    [name] PENNSAID 2 % SOLN     . rosuvastatin (CRESTOR) 20 MG tablet     . triamcinolone cream (KENALOG) 0.1 %     . umeclidinium-vilanterol (ANORO ELLIPTA) 62.5-25 MCG/INH AEPB Inhale 1 puff into the lungs daily. 60 each 10   No current facility-administered medications for this visit.      Allergies:   Latex; Shellfish allergy; and Sulfonamide derivatives   Social History:  The patient  reports that she quit smoking about 6 years ago. She has a 38.00 pack-year smoking history. She has never used smokeless tobacco. She reports that she does not drink alcohol or use drugs.   Family History:   family history includes Brain cancer in her sister; Breast cancer (age of onset: 38) in her mother; Colon cancer (age of onset: 59) in her father; Diabetes in her father; Glaucoma in her father; Heart failure in her father; Hypertension in her father.    Review of Systems: Review of Systems  Constitutional: Negative.   Respiratory: Negative.   Cardiovascular:  Positive for leg swelling.  Gastrointestinal: Negative.   Musculoskeletal: Positive for back pain.  Neurological: Negative.   Psychiatric/Behavioral: Negative.   All other systems reviewed and are negative.    PHYSICAL EXAM: VS:  BP 116/74 (BP Location: Left Arm, Patient Position: Sitting, Cuff Size: Large)   Pulse 73   Ht 5\' 5"  (1.651 m)   Wt 212 lb 8 oz (96.4 kg)   BMI 35.36 kg/m  , BMI Body mass index is 35.36 kg/m. GEN: Well nourished, well developed, in no acute distress  HEENT: normal  Neck: no JVD, carotid bruits, or masses Cardiac: RRR; no murmurs, rubs, or gallops,no edema  Respiratory:  clear to auscultation bilaterally, normal work of breathing GI: soft, nontender, nondistended, + BS MS: no deformity or atrophy  Skin: warm and dry, no rash Neuro:  Strength and sensation are intact Psych: euthymic mood, full affect    Recent Labs: No results found for requested labs within last 8760 hours.    Lipid Panel Lab Results  Component Value Date   CHOL 235 (H) 07/11/2011   HDL 58 07/11/2011   LDLCALC 149 (H) 07/11/2011   TRIG 140 07/11/2011      Wt Readings from Last 3 Encounters:  12/09/16 212 lb 8 oz (96.4 kg)  11/24/16 212 lb (96.2 kg)  10/30/16 213 lb (96.6 kg)       ASSESSMENT AND PLAN:   CAD/ CABG:  Denies any symptoms concerning for angina, No further testing  Secondary hypertension, unspecified -  Blood pressure is well controlled on today's visit. No changes made to the medications.  dizziness possibly from recent use of Lasix Takes Lasix  as needed  Chest pain, unspecified chest pain type -  Currently with no symptoms of angina.  Continue current medication regimen.  OSA on CPAP  Previously seen by pulmonary Tested positive per the patient  COPD   stopped smoking several years ago Cough is improved  Leg swelling Takes Lasix periodically, likely component of chronic diastolic CHF    Total encounter time more than 25 minutes   Greater than 50% was spent in counseling and coordination of care with the patient   Disposition:   F/U  6 months    Orders Placed This Encounter  Procedures  . EKG 12-Lead     Signed, Esmond Plants, M.D., Ph.D. 12/09/2016  [facility]  [place], [place]

## 2016-12-09 ENCOUNTER — Ambulatory Visit (INDEPENDENT_AMBULATORY_CARE_PROVIDER_SITE_OTHER): Payer: Medicare Other | Admitting: Cardiovascular Disease

## 2016-12-09 ENCOUNTER — Encounter: Payer: Self-pay | Admitting: Cardiovascular Disease

## 2016-12-09 VITALS — BP 116/74 | HR 73 | Ht 65.0 in | Wt 212.5 lb

## 2016-12-09 DIAGNOSIS — I739 Peripheral vascular disease, unspecified: Secondary | ICD-10-CM | POA: Diagnosis not present

## 2016-12-09 DIAGNOSIS — I10 Essential (primary) hypertension: Secondary | ICD-10-CM

## 2016-12-09 DIAGNOSIS — I5022 Chronic systolic (congestive) heart failure: Secondary | ICD-10-CM

## 2016-12-09 DIAGNOSIS — Z951 Presence of aortocoronary bypass graft: Secondary | ICD-10-CM

## 2016-12-09 DIAGNOSIS — I209 Angina pectoris, unspecified: Secondary | ICD-10-CM | POA: Diagnosis not present

## 2016-12-09 DIAGNOSIS — I25118 Atherosclerotic heart disease of native coronary artery with other forms of angina pectoris: Secondary | ICD-10-CM | POA: Diagnosis not present

## 2016-12-09 DIAGNOSIS — E782 Mixed hyperlipidemia: Secondary | ICD-10-CM | POA: Diagnosis not present

## 2016-12-09 MED ORDER — ASPIRIN 81 MG PO TABS: 81.0000 mg | ORAL_TABLET | Freq: Every day | ORAL | 3 refills | Status: DC

## 2016-12-09 NOTE — Patient Instructions (Addendum)
Medication Instructions:   No medication changes made  Try Co Q 10 for muscle cramps  Ok to do short breaks off the crestor to see if cramps gets better  Decrease the aspirin down to 81 mg daily  Labwork:  No new labs needed  Testing/Procedures:  No further testing at this time   I recommend watching educational videos on topics of interest to you at:       [email or web address]  Enter code: [email or web address]    Follow-Up: It was a pleasure seeing you in the office today. Please call us if you have new issues that need to be addressed before your next appt.  [phone]  Your physician wants you to follow-up in: 6 months.  You will receive a reminder letter in the mail two months in advance. If you don't receive a letter, please call our office to schedule the follow-up appointment.  If you need a refill on your cardiac medications before your next appointment, please call your pharmacy.

## 2016-12-11 ENCOUNTER — Inpatient Hospital Stay: Admission: RE | Admit: 2016-12-11 | Payer: Medicare Other | Source: Ambulatory Visit

## 2016-12-16 ENCOUNTER — Other Ambulatory Visit: Payer: Medicare Other

## 2016-12-25 ENCOUNTER — Encounter: Admission: RE | Payer: Self-pay | Source: Ambulatory Visit

## 2016-12-25 ENCOUNTER — Ambulatory Visit: Admission: RE | Admit: 2016-12-25 | Payer: Medicare Other | Source: Ambulatory Visit | Admitting: Orthopedic Surgery

## 2016-12-25 SURGERY — ARTHROSCOPY, KNEE
Anesthesia: Choice | Laterality: Right

## 2016-12-26 DIAGNOSIS — G4733 Obstructive sleep apnea (adult) (pediatric): Secondary | ICD-10-CM | POA: Diagnosis not present

## 2016-12-26 DIAGNOSIS — I259 Chronic ischemic heart disease, unspecified: Secondary | ICD-10-CM | POA: Diagnosis not present

## 2016-12-30 ENCOUNTER — Ambulatory Visit: Payer: Medicare Other | Admitting: Cardiovascular Disease

## 2017-01-01 ENCOUNTER — Other Ambulatory Visit: Payer: Self-pay

## 2017-01-01 ENCOUNTER — Encounter: Payer: Self-pay | Admitting: Gastroenterology

## 2017-01-01 ENCOUNTER — Ambulatory Visit (INDEPENDENT_AMBULATORY_CARE_PROVIDER_SITE_OTHER): Payer: Medicare HMO | Admitting: Gastroenterology

## 2017-01-01 VITALS — BP 115/81 | HR 74 | Temp 98.1°F | Ht 65.0 in | Wt 218.0 lb

## 2017-01-01 DIAGNOSIS — I25118 Atherosclerotic heart disease of native coronary artery with other forms of angina pectoris: Secondary | ICD-10-CM

## 2017-01-01 DIAGNOSIS — R112 Nausea with vomiting, unspecified: Secondary | ICD-10-CM | POA: Diagnosis not present

## 2017-01-01 DIAGNOSIS — R195 Other fecal abnormalities: Secondary | ICD-10-CM

## 2017-01-01 DIAGNOSIS — K921 Melena: Secondary | ICD-10-CM

## 2017-01-01 NOTE — Progress Notes (Signed)
Primary Care Physician: [name], [name], MD  Primary Gastroenterologist:  Dr. Lucilla Lame  Chief Complaint  Patient presents with  . Abdominal Pain  . dark stool    HPI: [name] is a 56 y.o. female here for a report of black stools, Belching, bloating and nausea. The patient also reports that she has lost a few pounds without trying to.  She can't quantify how much she actually was.  The patient had a colonoscopy by me in 2016.  There is no report of any bright red blood per rectum or vomiting any blood.  The patient also reports that she is bloated but denies constipation. The patient states that her stools have changed in size.  She reports them to become more narrow.  Current Outpatient Prescriptions  Medication Sig Dispense Refill  . albuterol (PROVENTIL HFA;VENTOLIN HFA) 108 (90 Base) MCG/ACT inhaler Inhale 2 puffs into the lungs every 6 (six) hours as needed for wheezing or shortness of breath. 1 Inhaler 3  . aspirin 81 MG tablet Take 1 tablet (81 mg total) by mouth daily. 90 tablet 3  . Azelastine HCl 0.15 % SOLN     . cetirizine (ZYRTEC) 10 MG chewable tablet Chew 1 tablet (10 mg total) by mouth daily.    [name] esomeprazole (NEXIUM) 40 MG capsule Reported on 09/04/2015    . fluticasone (FLONASE) 50 MCG/ACT nasal spray Place 2 sprays into both nostrils daily. Reported on 09/04/2015    . furosemide (LASIX) 20 MG tablet TAKE ONE TABLET BY MOUTH ONCE DAILY FOR SWELLING. MAY TAKE ONE ADDITIONAL TABLET AS NEEDED FOR SWELLING/FLUID 180 tablet 3  . Gabapentin, Once-Daily, 300 MG TABS Take 300 mg by mouth 3 (three) times daily. Start with 1 pill week 1, increase to 2 pills week 2, then take 3 pills daily 180 tablet 2  . hydrOXYzine (ATARAX/VISTARIL) 25 MG tablet     . isosorbide mononitrate (IMDUR) 30 MG 24 hr tablet TAKE ONE TABLET BY MOUTH TWICE DAILY 180 tablet 3  . KLOR-CON M10 10 MEQ tablet TAKE ONE TABLET BY MOUTH ONCE DAILY 90 tablet 3  . Magnesium 250 MG TABS Take 1  tablet by mouth daily as needed (leg cramps). Reported on 09/04/2015    . meclizine (ANTIVERT) 25 MG tablet Take 1 tablet (25 mg total) by mouth 2 (two) times daily. (Patient taking differently: Take 25 mg by mouth 3 (three) times daily. ) 30 tablet   . meloxicam (MOBIC) 15 MG tablet Take 1 tablet (15 mg total) by mouth daily.    . metFORMIN (GLUCOPHAGE) 500 MG tablet     . metoprolol tartrate (LOPRESSOR) 25 MG tablet TAKE ONE TABLET BY MOUTH TWICE DAILY 180 tablet 3  . Multiple Vitamin (MULTIVITAMIN) tablet Take 1 tablet by mouth daily.    . nitroGLYCERIN (NITROSTAT) 0.4 MG SL tablet Place 1 tablet (0.4 mg total) under the tongue every 5 (five) minutes as needed.    . NON FORMULARY cpap at bedtime    . PARoxetine (PAXIL) 40 MG tablet Take 40 mg by mouth every morning.    [name] PENNSAID 2 % SOLN     . rosuvastatin (CRESTOR) 20 MG tablet     . triamcinolone cream (KENALOG) 0.1 %     . umeclidinium-vilanterol (ANORO ELLIPTA) 62.5-25 MCG/INH AEPB Inhale 1 puff into the lungs daily. 60 each 10  . Biotin w/ Vitamins C & E (HAIR SKIN & NAILS GUMMIES PO) Take 2 tablets by mouth daily. Reported on 09/04/2015    .  cyclobenzaprine (FLEXERIL) 10 MG tablet     . ezetimibe (ZETIA) 10 MG tablet TAKE ONE TABLET BY MOUTH ONCE DAILY (Patient not taking: Reported on 01/01/2017) 90 tablet 3   No current facility-administered medications for this visit.     Allergies as of 01/01/2017 - Review Complete 12/09/2016  Allergen Reaction Noted  . Latex  08/23/2010  . Shellfish allergy    . Sulfonamide derivatives  08/23/2010    ROS:  General: Negative for anorexia, weight loss, fever, chills, fatigue, weakness. ENT: Negative for hoarseness, difficulty swallowing , nasal congestion. CV: Negative for chest pain, angina, palpitations, dyspnea on exertion, peripheral edema.  Respiratory: Negative for dyspnea at rest, dyspnea on exertion, cough, sputum, wheezing.  GI: See history of present illness. GU:  Negative for  dysuria, hematuria, urinary incontinence, urinary frequency, nocturnal urination.  Endo: Negative for unusual weight change.    Physical Examination:   BP 115/81   Pulse 74   Temp 98.1 F (36.7 C) (Oral)   Ht 5\' 5"  (1.651 m)   Wt 218 lb (98.9 kg)   BMI 36.28 kg/m   General: Well-nourished, well-developed in no acute distress.  Eyes: No icterus. Conjunctivae pink. Mouth: Oropharyngeal mucosa moist and pink , no lesions erythema or exudate. Lungs: Clear to auscultation bilaterally. Non-labored. Heart: Regular rate and rhythm, no murmurs rubs or gallops.  Abdomen: Bowel sounds are normal, nontender, nondistended, no hepatosplenomegaly or masses, no abdominal bruits or hernia , no rebound or guarding.   Extremities: No lower extremity edema. No clubbing or deformities. Neuro: Alert and oriented x 3.  Grossly intact. Skin: Warm and dry, no jaundice.   Psych: Alert and cooperative, normal mood and affect.  Labs:    Imaging Studies: No results found.  Assessment and Plan:   [name] is a 56 y.o. y/o female who comes in with a change in bowel habits with pencil like stools, heme positive stools, weight loss, nausea and bloating.  The patient will be set up for an EGD and colonoscopy. I have discussed risks & benefits which include, but are not limited to, bleeding, infection, perforation & drug reaction.  The patient agrees with this plan & written consent will be obtained.       Lucilla Lame, MD. [facility]   Note: This dictation was prepared with Dragon dictation along with smaller phrase technology. Any transcriptional errors that result from this process are unintentional.

## 2017-01-02 ENCOUNTER — Telehealth: Payer: Self-pay | Admitting: Gastroenterology

## 2017-01-02 NOTE — Telephone Encounter (Signed)
01/02/17 NO prior auth required for MCR/MCD for Colonoscopy and EGD.

## 2017-01-05 ENCOUNTER — Ambulatory Visit: Payer: Medicare Other | Admitting: Pulmonary Disease

## 2017-01-14 ENCOUNTER — Encounter: Payer: Self-pay | Admitting: Internal Medicine

## 2017-01-14 DIAGNOSIS — E236 Other disorders of pituitary gland: Secondary | ICD-10-CM | POA: Diagnosis not present

## 2017-01-14 DIAGNOSIS — E237 Disorder of pituitary gland, unspecified: Secondary | ICD-10-CM | POA: Diagnosis not present

## 2017-01-19 DIAGNOSIS — R071 Chest pain on breathing: Secondary | ICD-10-CM | POA: Diagnosis not present

## 2017-01-19 DIAGNOSIS — J069 Acute upper respiratory infection, unspecified: Secondary | ICD-10-CM | POA: Diagnosis not present

## 2017-01-19 DIAGNOSIS — D352 Benign neoplasm of pituitary gland: Secondary | ICD-10-CM | POA: Diagnosis not present

## 2017-01-19 DIAGNOSIS — I10 Essential (primary) hypertension: Secondary | ICD-10-CM | POA: Diagnosis not present

## 2017-01-19 DIAGNOSIS — E119 Type 2 diabetes mellitus without complications: Secondary | ICD-10-CM | POA: Diagnosis not present

## 2017-01-25 DIAGNOSIS — G4733 Obstructive sleep apnea (adult) (pediatric): Secondary | ICD-10-CM | POA: Diagnosis not present

## 2017-01-25 DIAGNOSIS — I259 Chronic ischemic heart disease, unspecified: Secondary | ICD-10-CM | POA: Diagnosis not present

## 2017-01-26 ENCOUNTER — Encounter: Payer: Self-pay | Admitting: *Deleted

## 2017-01-27 ENCOUNTER — Ambulatory Visit: Payer: Medicare HMO | Admitting: Anesthesiology

## 2017-01-27 ENCOUNTER — Encounter
Admission: RE | Disposition: A | Discharge status: Home / Self Care | Payer: Self-pay | Source: Ambulatory Visit | Attending: Gastroenterology

## 2017-01-27 ENCOUNTER — Ambulatory Visit
Admission: RE | Admit: 2017-01-27 | Discharge: 2017-01-27 | Disposition: A | Discharge status: Home / Self Care | Payer: Medicare HMO | Source: Ambulatory Visit | Attending: Gastroenterology | Admitting: Gastroenterology

## 2017-01-27 ENCOUNTER — Encounter: Payer: Self-pay | Admitting: *Deleted

## 2017-01-27 DIAGNOSIS — K2970 Gastritis, unspecified, without bleeding: Secondary | ICD-10-CM

## 2017-01-27 DIAGNOSIS — K635 Polyp of colon: Secondary | ICD-10-CM | POA: Diagnosis not present

## 2017-01-27 DIAGNOSIS — K259 Gastric ulcer, unspecified as acute or chronic, without hemorrhage or perforation: Secondary | ICD-10-CM | POA: Diagnosis not present

## 2017-01-27 DIAGNOSIS — R195 Other fecal abnormalities: Secondary | ICD-10-CM

## 2017-01-27 DIAGNOSIS — I110 Hypertensive heart disease with heart failure: Secondary | ICD-10-CM | POA: Insufficient documentation

## 2017-01-27 DIAGNOSIS — K253 Acute gastric ulcer without hemorrhage or perforation: Secondary | ICD-10-CM | POA: Diagnosis not present

## 2017-01-27 DIAGNOSIS — Z955 Presence of coronary angioplasty implant and graft: Secondary | ICD-10-CM | POA: Diagnosis not present

## 2017-01-27 DIAGNOSIS — K641 Second degree hemorrhoids: Secondary | ICD-10-CM | POA: Diagnosis not present

## 2017-01-27 DIAGNOSIS — Z87891 Personal history of nicotine dependence: Secondary | ICD-10-CM | POA: Insufficient documentation

## 2017-01-27 DIAGNOSIS — D125 Benign neoplasm of sigmoid colon: Secondary | ICD-10-CM | POA: Insufficient documentation

## 2017-01-27 DIAGNOSIS — I252 Old myocardial infarction: Secondary | ICD-10-CM | POA: Diagnosis not present

## 2017-01-27 DIAGNOSIS — R112 Nausea with vomiting, unspecified: Secondary | ICD-10-CM | POA: Diagnosis not present

## 2017-01-27 DIAGNOSIS — D122 Benign neoplasm of ascending colon: Secondary | ICD-10-CM

## 2017-01-27 DIAGNOSIS — Z86010 Personal history of colonic polyps: Secondary | ICD-10-CM | POA: Diagnosis not present

## 2017-01-27 DIAGNOSIS — K219 Gastro-esophageal reflux disease without esophagitis: Secondary | ICD-10-CM | POA: Diagnosis not present

## 2017-01-27 DIAGNOSIS — F1411 Cocaine abuse, in remission: Secondary | ICD-10-CM | POA: Insufficient documentation

## 2017-01-27 DIAGNOSIS — G4730 Sleep apnea, unspecified: Secondary | ICD-10-CM | POA: Diagnosis not present

## 2017-01-27 DIAGNOSIS — F329 Major depressive disorder, single episode, unspecified: Secondary | ICD-10-CM | POA: Diagnosis not present

## 2017-01-27 DIAGNOSIS — I739 Peripheral vascular disease, unspecified: Secondary | ICD-10-CM | POA: Diagnosis not present

## 2017-01-27 DIAGNOSIS — E119 Type 2 diabetes mellitus without complications: Secondary | ICD-10-CM | POA: Diagnosis not present

## 2017-01-27 DIAGNOSIS — I509 Heart failure, unspecified: Secondary | ICD-10-CM | POA: Insufficient documentation

## 2017-01-27 DIAGNOSIS — I10 Essential (primary) hypertension: Secondary | ICD-10-CM | POA: Diagnosis not present

## 2017-01-27 DIAGNOSIS — J449 Chronic obstructive pulmonary disease, unspecified: Secondary | ICD-10-CM | POA: Insufficient documentation

## 2017-01-27 DIAGNOSIS — Z951 Presence of aortocoronary bypass graft: Secondary | ICD-10-CM | POA: Diagnosis not present

## 2017-01-27 DIAGNOSIS — Z79899 Other long term (current) drug therapy: Secondary | ICD-10-CM | POA: Insufficient documentation

## 2017-01-27 DIAGNOSIS — Z8541 Personal history of malignant neoplasm of cervix uteri: Secondary | ICD-10-CM | POA: Insufficient documentation

## 2017-01-27 DIAGNOSIS — Z7982 Long term (current) use of aspirin: Secondary | ICD-10-CM | POA: Insufficient documentation

## 2017-01-27 DIAGNOSIS — K5730 Diverticulosis of large intestine without perforation or abscess without bleeding: Secondary | ICD-10-CM | POA: Diagnosis not present

## 2017-01-27 DIAGNOSIS — I2510 Atherosclerotic heart disease of native coronary artery without angina pectoris: Secondary | ICD-10-CM | POA: Diagnosis not present

## 2017-01-27 DIAGNOSIS — E785 Hyperlipidemia, unspecified: Secondary | ICD-10-CM | POA: Diagnosis not present

## 2017-01-27 DIAGNOSIS — K257 Chronic gastric ulcer without hemorrhage or perforation: Secondary | ICD-10-CM | POA: Diagnosis not present

## 2017-01-27 DIAGNOSIS — Z800 Family history of malignant neoplasm of digestive organs: Secondary | ICD-10-CM | POA: Insufficient documentation

## 2017-01-27 DIAGNOSIS — K449 Diaphragmatic hernia without obstruction or gangrene: Secondary | ICD-10-CM | POA: Insufficient documentation

## 2017-01-27 DIAGNOSIS — K2270 Barrett's esophagus without dysplasia: Secondary | ICD-10-CM | POA: Diagnosis not present

## 2017-01-27 DIAGNOSIS — K2900 Acute gastritis without bleeding: Secondary | ICD-10-CM | POA: Diagnosis not present

## 2017-01-27 DIAGNOSIS — R1013 Epigastric pain: Secondary | ICD-10-CM | POA: Diagnosis not present

## 2017-01-27 HISTORY — DX: Acute myocardial infarction, unspecified: I21.9

## 2017-01-27 HISTORY — PX: COLONOSCOPY WITH PROPOFOL: SHX5780

## 2017-01-27 HISTORY — PX: ESOPHAGOGASTRODUODENOSCOPY (EGD) WITH PROPOFOL: SHX5813

## 2017-01-27 LAB — GLUCOSE, CAPILLARY: GLUCOSE-CAPILLARY: 119 mg/dL — AB (ref 65–99)

## 2017-01-27 SURGERY — COLONOSCOPY WITH PROPOFOL
Anesthesia: General

## 2017-01-27 MED ORDER — MIDAZOLAM HCL 2 MG/2ML IJ SOLN
INTRAMUSCULAR | Status: AC
  Filled 2017-01-27: qty 2

## 2017-01-27 MED ORDER — PROPOFOL 10 MG/ML IV BOLUS
INTRAVENOUS | Status: DC | PRN
  Administered 2017-01-27: 50 mg via INTRAVENOUS

## 2017-01-27 MED ORDER — LIDOCAINE HCL (PF) 2 % IJ SOLN
INTRAMUSCULAR | Status: AC
  Filled 2017-01-27: qty 2

## 2017-01-27 MED ORDER — MIDAZOLAM HCL 2 MG/2ML IJ SOLN
INTRAMUSCULAR | Status: DC | PRN
  Administered 2017-01-27: 1 mg via INTRAVENOUS

## 2017-01-27 MED ORDER — PHENYLEPHRINE HCL 10 MG/ML IJ SOLN
INTRAMUSCULAR | Status: DC | PRN
  Administered 2017-01-27: 200 ug via INTRAVENOUS

## 2017-01-27 MED ORDER — PROPOFOL 500 MG/50ML IV EMUL
INTRAVENOUS | Status: DC | PRN
  Administered 2017-01-27: 130 ug/kg/min via INTRAVENOUS

## 2017-01-27 MED ORDER — LIDOCAINE HCL (CARDIAC) 20 MG/ML IV SOLN
INTRAVENOUS | Status: DC | PRN
  Administered 2017-01-27: 100 mg via INTRAVENOUS

## 2017-01-27 MED ORDER — PROPOFOL 500 MG/50ML IV EMUL
INTRAVENOUS | Status: AC
  Filled 2017-01-27: qty 50

## 2017-01-27 MED ORDER — SODIUM CHLORIDE 0.9 % IV SOLN
INTRAVENOUS | Status: DC
  Administered 2017-01-27 (×2): via INTRAVENOUS

## 2017-01-27 NOTE — Op Note (Signed)
[facility] Patient Name: [name] Procedure Date: 01/27/2017 8:49 AM MRN: [id number] Account #: [id number] Date of Birth: [birth year] Admit Type: Outpatient Age: 56 Room: [facility] ENDO ROOM 4 Gender: Female Note Status: Finalized Procedure:            Colonoscopy Indications:          Change in stool caliber Providers:            Lucilla Lame MD, MD Referring MD:         [name] [name] MD, MD (Referring MD) Medicines:            Propofol per Anesthesia Complications:        No immediate complications. Procedure:            Pre-Anesthesia Assessment:                       - Prior to the procedure, a History and Physical was                        performed, and patient medications and allergies were                        reviewed. The patient's tolerance of previous                        anesthesia was also reviewed. The risks and benefits of                        the procedure and the sedation options and risks were                        discussed with the patient. All questions were                        answered, and informed consent was obtained. Prior                        Anticoagulants: The patient has taken no previous                        anticoagulant or antiplatelet agents. ASA Grade                        Assessment: II - A patient with mild systemic disease.                        After reviewing the risks and benefits, the patient was                        deemed in satisfactory condition to undergo the                        procedure.                       After obtaining informed consent, the colonoscope was                        passed under direct vision. Throughout the procedure,  the patient's blood pressure, pulse, and oxygen                        saturations were monitored continuously. The                        Colonoscope was introduced through the anus and                         advanced to the the cecum, identified by appendiceal                        orifice and ileocecal valve. The colonoscopy was                        performed without difficulty. The patient tolerated the                        procedure well. The quality of the bowel preparation                        was excellent. Findings:      The perianal and digital rectal examinations were normal.      Two sessile polyps were found in the sigmoid colon. The polyps were 2 to       4 mm in size. These polyps were removed with a cold snare. Resection and       retrieval were complete.      A 4 mm polyp was found in the ascending colon. The polyp was sessile.       The polyp was removed with a cold snare. Resection and retrieval were       complete.      Multiple small-mouthed diverticula were found in the sigmoid colon.      Non-bleeding internal hemorrhoids were found during retroflexion. The       hemorrhoids were Grade II (internal hemorrhoids that prolapse but reduce       spontaneously). Impression:           - Two 2 to 4 mm polyps in the sigmoid colon, removed                        with a cold snare. Resected and retrieved.                       - One 4 mm polyp in the ascending colon, removed with a                        cold snare. Resected and retrieved.                       - Diverticulosis in the sigmoid colon.                       - Non-bleeding internal hemorrhoids. Recommendation:       - Discharge patient to home.                       - Resume previous diet.                       - Continue  present medications.                       - Await pathology results.                       - Repeat colonoscopy in 5 years if polyp adenoma and 10                        years if hyperplastic Procedure Code(s):    --- Professional ---                       [id number], Colonoscopy, flexible; with removal of tumor(s),                        polyp(s), or other lesion(s) by snare  technique Diagnosis Code(s):    --- Professional ---                       R19.5, Other fecal abnormalities                       D12.5, Benign neoplasm of sigmoid colon                       D12.2, Benign neoplasm of ascending colon CPT copyright 2016 [employer]. All rights reserved. The codes documented in this report are preliminary and upon coder review may  be revised to meet current compliance requirements. Lucilla Lame MD, MD 01/27/2017 9:27:43 AM This report has been signed electronically. Number of Addenda: 0 Note Initiated On: 01/27/2017 8:49 AM Scope Withdrawal Time: 0 hours 7 minutes 22 seconds  Total Procedure Duration: 0 hours 13 minutes 19 seconds       [facility]

## 2017-01-27 NOTE — Op Note (Signed)
[facility] Gastroenterology Patient Name: [name] Procedure Date: 01/27/2017 8:50 AM MRN: [id number] Account #: [id number] Date of Birth: March 15, 1961 Admit Type: Outpatient Age: 56 Room: [facility] ENDO ROOM 4 Gender: Female Note Status: Finalized Procedure:            Upper GI endoscopy Indications:          Nausea with vomiting Providers:            Lucilla Lame MD, MD Referring MD:         [name] [name] MD, MD (Referring MD) Medicines:            Propofol per Anesthesia Complications:        No immediate complications. Procedure:            Pre-Anesthesia Assessment:                       - Prior to the procedure, a History and Physical was                        performed, and patient medications and allergies were                        reviewed. The patient's tolerance of previous                        anesthesia was also reviewed. The risks and benefits of                        the procedure and the sedation options and risks were                        discussed with the patient. All questions were                        answered, and informed consent was obtained. Prior                        Anticoagulants: The patient has taken no previous                        anticoagulant or antiplatelet agents. ASA Grade                        Assessment: II - A patient with mild systemic disease.                        After reviewing the risks and benefits, the patient was                        deemed in satisfactory condition to undergo the                        procedure.                       After obtaining informed consent, the endoscope was                        passed under direct vision. Throughout the procedure,  the patient's blood pressure, pulse, and oxygen                        saturations were monitored continuously. The Endoscope                        was introduced through the mouth, and advanced to the                      second part of duodenum. The upper GI endoscopy was                        accomplished without difficulty. The patient tolerated                        the procedure well. Findings:      A small hiatal hernia was present.      Few non-bleeding linear gastric ulcers with no stigmata of bleeding were       found in the gastric antrum. This was biopsied with a cold forceps for       histology.      Localized moderate inflammation characterized by erosions was found in       the gastric antrum. Biopsies were taken with a cold forceps for       histology.      The examined duodenum was normal. Impression:           - Small hiatal hernia.                       - Non-bleeding gastric ulcers with no stigmata of                        bleeding. Biopsied.                       - Gastritis. Biopsied.                       - Normal examined duodenum. Recommendation:       - Discharge patient to home.                       - Resume previous diet.                       - Continue present medications.                       - Await pathology results.                       - Perform a colonoscopy today. Procedure Code(s):    --- Professional ---                       [phone], Esophagogastroduodenoscopy, flexible, transoral;                        with biopsy, single or multiple Diagnosis Code(s):    --- Professional ---                       R11.2, Nausea with vomiting, unspecified  K25.9, Gastric ulcer, unspecified as acute or chronic,                        without hemorrhage or perforation                       K29.70, Gastritis, unspecified, without bleeding CPT copyright 2016 American Medical Association. All rights reserved. The codes documented in this report are preliminary and upon coder review may  be revised to meet current compliance requirements. Lucilla Lame MD, MD 01/27/2017 9:11:05 AM This report has been signed electronically. Number of Addenda:  0 Note Initiated On: 01/27/2017 8:50 AM      [facility]

## 2017-01-27 NOTE — Anesthesia Postprocedure Evaluation (Signed)
Anesthesia Post Note  Patient: [name]  Procedure(s) Performed: Procedure(s) (LRB): COLONOSCOPY WITH PROPOFOL (N/A) ESOPHAGOGASTRODUODENOSCOPY (EGD) WITH PROPOFOL (N/A)  Patient location during evaluation: PACU Anesthesia Type: General Level of consciousness: awake and alert and oriented Pain management: pain level controlled Vital Signs Assessment: post-procedure vital signs reviewed and stable Respiratory status: spontaneous breathing Cardiovascular status: blood pressure returned to baseline Anesthetic complications: no     Last Vitals:  Vitals:   01/27/17 0949 01/27/17 0959  BP: 113/62 112/69  Pulse: 67 69  Resp: 16 16  Temp:      Last Pain:  Vitals:   01/27/17 0929  TempSrc: Axillary                 [name]

## 2017-01-27 NOTE — H&P (Signed)
Lorraine Lame, MD [address] [place], [place] [address] Phone:[phone] Fax : [phone]  Primary Care Physician:  [name], [name], MD Primary Gastroenterologist:  Dr. Allen Norris  Pre-Procedure History & Physical: HPI:  [name] is a 56 y.o. female is here for an endoscopy and colonoscopy.   Past Medical History:  Diagnosis Date  . Anemia   . Arthritis   . Bacterial vaginitis   . Blood in stool   . Brachial neuritis or radiculitis NOS   . Bronchitis   . Cardiac arrhythmia   . Cervical neck pain with evidence of disc disease    C5/6 disease, MRI done late 2012 - no records available  . Chronic pain   . Cocaine abuse, in remission    clean x 24 years  . Congestive heart failure ([place])   . COPD (chronic obstructive pulmonary disease) ([place])   . Coronary artery disease    PCI of left circumflex 2003, PCI of the LAD 2012 with a bare-metal stent (2.5 x 8 mm);   s/p CABG 4/12:  L-LAD, S-Dx, S-OM, S-RCA (Dr. Prescott Gum)  . Depression   . Family history of colon cancer   . Generalized headaches    frequent  . GERD (gastroesophageal reflux disease)   . Heart murmur   . Hematochezia   . History of cervical cancer    s/p cryotherapy  . History of drug abuse    cocaine, marijuana, clean since 1989  . History of hepatitis B    from eating undercooked liver  . History of kidney stones   . History of MI (myocardial infarction)   . Hyperlipidemia   . Hypertension   . Myocardial infarction ([place]) 2003  . PAD (peripheral artery disease) (HCC)    s/p Right SFA atherectomy and PTA 01/15/11, normal ABIs 01/2011  . Polyp of colon   . Seasonal allergies   . Sleep apnea   . Smoking history    quit 07/2010  . Systolic CHF, chronic (HCC)    mild: echo 08/2010 - mildly reduced EF 40-45%, mild diffuse hypokinesis  . Thyroid disease   . Urine incontinence     Past Surgical History:  Procedure Laterality Date  . CHOLECYSTECTOMY  1980  . COLONOSCOPY  2008   3  polyps  . COLONOSCOPY WITH PROPOFOL N/A 02/06/2015   Procedure: COLONOSCOPY WITH PROPOFOL;  Surgeon: Lorraine Lame, MD;  Location: [facility] ENDOSCOPY;  Service: Endoscopy;  Laterality: N/A;  . CORONARY ANGIOPLASTY     w/ stent placement x2  . CORONARY ARTERY BYPASS GRAFT  2012   Dr [name]  . CORONARY STENT PLACEMENT  2003   S/P MI  . CORONARY STENT PLACEMENT  2007   [place]  . ESOPHAGOGASTRODUODENOSCOPY (EGD) WITH PROPOFOL N/A 02/06/2015   Procedure: ESOPHAGOGASTRODUODENOSCOPY (EGD) WITH PROPOFOL;  Surgeon: Lorraine Lame, MD;  Location: [facility] ENDOSCOPY;  Service: Endoscopy;  Laterality: N/A;  . FEMORAL ARTERY STENT  10/2010   right sided (Dr. Burt Knack)    Prior to Admission medications   Medication Sig Start Date End Date Taking? Authorizing Provider  albuterol (PROVENTIL HFA;VENTOLIN HFA) 108 (90 Base) MCG/ACT inhaler Inhale 2 puffs into the lungs every 6 (six) hours as needed for wheezing or shortness of breath. 04/10/16  Yes Wilhelmina Mcardle, MD  aspirin 81 MG tablet Take 1 tablet (81 mg total) by mouth daily. 12/09/16  Yes Minna Merritts, MD  cetirizine (ZYRTEC) 10 MG chewable tablet Chew 1 tablet (10 mg total) by mouth  daily. 01/20/14  Yes Minna Merritts, MD  cyclobenzaprine (FLEXERIL) 10 MG tablet  12/29/16  Yes [provider]  esomeprazole (NEXIUM) 40 MG capsule Reported on 09/04/2015 05/31/15  Yes [provider]  ezetimibe (ZETIA) 10 MG tablet TAKE ONE TABLET BY MOUTH ONCE DAILY 11/24/16  Yes [name], Kathlene November, MD  fluticasone (FLONASE) 50 MCG/ACT nasal spray Place 2 sprays into both nostrils daily. Reported on 09/04/2015   Yes [provider]  furosemide (LASIX) 20 MG tablet TAKE ONE TABLET BY MOUTH ONCE DAILY FOR SWELLING. MAY TAKE ONE ADDITIONAL TABLET AS NEEDED FOR SWELLING/FLUID 09/25/15  Yes [name], Kathlene November, MD  Gabapentin, Once-Daily, 300 MG TABS Take 300 mg by mouth 3 (three) times daily. Start with 1 pill week 1, increase to 2 pills week 2, then take 3 pills  daily 11/24/16  Yes Will Bonnet, MD  hydrOXYzine (ATARAX/VISTARIL) 25 MG tablet  12/30/16  Yes [provider]  isosorbide mononitrate (IMDUR) 30 MG 24 hr tablet TAKE ONE TABLET BY MOUTH TWICE DAILY 05/26/16  Yes [name], Kathlene November, MD  KLOR-CON M10 10 MEQ tablet TAKE ONE TABLET BY MOUTH ONCE DAILY 08/12/16  Yes Minna Merritts, MD  metFORMIN (GLUCOPHAGE) 500 MG tablet  08/30/15  Yes [provider]  metoprolol tartrate (LOPRESSOR) 25 MG tablet TAKE ONE TABLET BY MOUTH TWICE DAILY 04/30/15  Yes Minna Merritts, MD  Multiple Vitamin (MULTIVITAMIN) tablet Take 1 tablet by mouth daily. 01/20/14  Yes [name], Kathlene November, MD  nitroGLYCERIN (NITROSTAT) 0.4 MG SL tablet Place 1 tablet (0.4 mg total) under the tongue every 5 (five) minutes as needed. 01/20/14  Yes Minna Merritts, MD  rosuvastatin (CRESTOR) 20 MG tablet  10/31/16  Yes [provider]  triamcinolone cream (KENALOG) 0.1 %  10/31/16  Yes [provider]  Azelastine HCl 0.15 % SOLN  10/31/16   [provider]  Biotin w/ Vitamins C & E (HAIR SKIN & NAILS GUMMIES PO) Take 2 tablets by mouth daily. Reported on 09/04/2015    [provider]  Magnesium 250 MG TABS Take 1 tablet by mouth daily as needed (leg cramps). Reported on 09/04/2015    [provider]  meclizine (ANTIVERT) 25 MG tablet Take 1 tablet (25 mg total) by mouth 2 (two) times daily. Patient taking differently: Take 25 mg by mouth 3 (three) times daily.  01/20/14   Minna Merritts, MD  meloxicam (MOBIC) 15 MG tablet Take 1 tablet (15 mg total) by mouth daily. Patient not taking: Reported on 01/27/2017 01/20/14   Minna Merritts, MD  NON FORMULARY cpap at bedtime    [provider]  PARoxetine (PAXIL) 40 MG tablet Take 40 mg by mouth every morning.    [provider]  PENNSAID 2 % SOLN  11/13/16   [provider]  umeclidinium-vilanterol (ANORO ELLIPTA) 62.5-25 MCG/INH AEPB Inhale 1 puff into the lungs  daily. 05/30/16   Wilhelmina Mcardle, MD    Allergies as of 01/01/2017 - Review Complete 12/09/2016  Allergen Reaction Noted  . Latex  08/23/2010  . Shellfish allergy    . Sulfonamide derivatives  08/23/2010    Family History  Problem Relation Age of Onset  . Hypertension Father   . Heart failure Father   . Diabetes Father   . Colon cancer Father 80  . Glaucoma Father   . Breast cancer Mother 58       breast cancer, late 56's  . Brain cancer Sister   .  Coronary artery disease Neg Hx   . Stroke Neg Hx     Social History   Social History  . Marital status: Legally Separated    Spouse name: N/A  . Number of children: N/A  . Years of education: N/A   Occupational History  . Not on file.   Social History Main Topics  . Smoking status: Former Smoker    Packs/day: 1.00    Years: 38.00    Quit date: 08/12/2010  . Smokeless tobacco: Never Used  . Alcohol use No  . Drug use: No     Comment: Remote Hx (crack cocaine and marijuana)  . Sexual activity: Yes   Other Topics Concern  . Not on file   Social History Narrative   Caffeine: 1 cup coffee/day   Lives alone, no pets   Occupation: industrial work, prior [employer], in process of getting disability, initially denied   Edu: 11th grade   Activity: no regular exercise   Diet: good water, vegetables daily, low salt diet    Review of Systems: See HPI, otherwise negative ROS  Physical Exam: BP 118/84   Pulse 79   Temp 97.2 F (36.2 C) (Tympanic)   Resp 18   Ht 5\' 5"  (1.651 m)   Wt 218 lb (98.9 kg)   SpO2 100%   BMI 36.28 kg/m  General:   Alert,  pleasant and cooperative in NAD Head:  Normocephalic and atraumatic. Neck:  Supple; no masses or thyromegaly. Lungs:  Clear throughout to auscultation.    Heart:  Regular rate and rhythm. Abdomen:  Soft, nontender and nondistended. Normal bowel sounds, without guarding, and without rebound.   Neurologic:  Alert and  oriented x4;  grossly normal  neurologically.  Impression/Plan: [name] is here for an endoscopy and colonoscopy to be performed for heme positive stools, bloating and change in stools caliber.  Risks, benefits, limitations, and alternatives regarding  endoscopy and colonoscopy have been reviewed with the patient.  Questions have been answered.  All parties agreeable.   Lorraine Lame, MD  01/27/2017, 8:17 AM

## 2017-01-27 NOTE — Transfer of Care (Signed)
Immediate Anesthesia Transfer of Care Note  Patient: [name]  Procedure(s) Performed: Procedure(s): COLONOSCOPY WITH PROPOFOL (N/A) ESOPHAGOGASTRODUODENOSCOPY (EGD) WITH PROPOFOL (N/A)  Patient Location: Endoscopy Unit  Anesthesia Type:General  Level of Consciousness: drowsy and patient cooperative  Airway & Oxygen Therapy: Patient Spontanous Breathing and Patient connected to nasal cannula oxygen  Post-op Assessment: Report given to RN and Post -op Vital signs reviewed and stable  Post vital signs: Reviewed and stable  Last Vitals:  Vitals:   01/27/17 0805  BP: 118/84  Pulse: 79  Resp: 18  Temp: 36.2 C    Last Pain:  Vitals:   01/27/17 0805  TempSrc: Tympanic         Complications: No apparent anesthesia complications

## 2017-01-27 NOTE — Anesthesia Post-op Follow-up Note (Cosign Needed)
Anesthesia QCDR form completed.        

## 2017-01-27 NOTE — Anesthesia Preprocedure Evaluation (Signed)
Anesthesia Evaluation  Patient identified by MRN, date of birth, ID band Patient awake    Reviewed: Allergy & Precautions, H&P , NPO status , Patient's Chart, lab work & pertinent test results, reviewed documented beta blocker date and time   Airway Mallampati: II  TM Distance: >3 FB Neck ROM: full    Dental no notable dental hx. (+) Teeth Intact   Pulmonary shortness of breath and with exertion, asthma , sleep apnea , COPD, former smoker,    Pulmonary exam normal breath sounds clear to auscultation       Cardiovascular hypertension, + CAD, + Past MI, + Peripheral Vascular Disease and +CHF  Normal cardiovascular exam+ dysrhythmias + Valvular Problems/Murmurs  Rhythm:regular Rate:Normal     Neuro/Psych  Headaches, PSYCHIATRIC DISORDERS Depression  Neuromuscular disease    GI/Hepatic Neg liver ROS, GERD  Medicated and Controlled,(+)     substance abuse  cocaine use, Hepatitis -, B  Endo/Other  negative endocrine ROSdiabetes, Well Controlled, Type 2, Oral Hypoglycemic Agents  Renal/GU negative Renal ROS  negative genitourinary   Musculoskeletal  (+) Arthritis , Osteoarthritis,    Abdominal   Peds  Hematology  (+) anemia ,   Anesthesia Other Findings Past Medical History:   Coronary artery disease                                        Comment:PCI of left circumflex 2003, PCI of the LAD               2012 with a bare-metal stent (2.5 x 8 mm);                 s/p CABG 4/12:  L-LAD, S-Dx, S-OM, S-RCA (Dr.               Prescott Gum)   Hyperlipidemia                                               Hypertension                                                 PAD (peripheral artery disease)                                Comment:s/p Right SFA atherectomy and PTA 01/15/11,               normal ABIs 01/2011   Brachial neuritis or radiculitis NOS                         Systolic CHF, chronic                                         Comment:mild: echo 08/2010 - mildly reduced EF 40-45%,               mild diffuse hypokinesis   Anemia  Cocaine abuse, in remission                                    Comment:clean x 24 years   Arthritis                                                    Blood in stool                                               History of cervical cancer                                     Comment:s/p cryotherapy   Depression                                                   Generalized headaches                                          Comment:frequent   GERD (gastroesophageal reflux disease)                       Seasonal allergies                                           Cardiac arrhythmia                                           Heart murmur                                                 History of hepatitis B                                         Comment:from eating undercooked liver   History of kidney stones                                     Urine incontinence                                           History of drug abuse  Comment:cocaine, marijuana, clean since 1989   Smoking history                                                Comment:quit 07/2010   Chronic pain                                                 Cervical neck pain with evidence of disc disea*                Comment:C5/6 disease, MRI done late 2012 - no records               available   Bronchitis                                                   Bacterial vaginitis                                          History of MI (myocardial infarction)                        Thyroid disease                                              Hematochezia                                                 Family history of colon cancer                               Polyp of colon                                               Congestive heart  failure                                     Sleep apnea                                                  COPD (chronic obstructive pulmonary disease)                 Reproductive/Obstetrics negative OB ROS  Anesthesia Physical  Anesthesia Plan  ASA: III  Anesthesia Plan: General   Post-op Pain Management:    Induction:   PONV Risk Score and Plan:   Airway Management Planned: Nasal Cannula  Additional Equipment:   Intra-op Plan:   Post-operative Plan:   Informed Consent: I have reviewed the patients History and Physical, chart, labs and discussed the procedure including the risks, benefits and alternatives for the proposed anesthesia with the patient or authorized representative who has indicated his/her understanding and acceptance.   Dental Advisory Given  Plan Discussed with: Anesthesiologist, CRNA and Surgeon  Anesthesia Plan Comments:         Anesthesia Quick Evaluation

## 2017-01-28 NOTE — Progress Notes (Signed)
Voicemail.  No Message Left. 

## 2017-01-29 ENCOUNTER — Encounter: Payer: Self-pay | Admitting: Gastroenterology

## 2017-01-30 LAB — SURGICAL PATHOLOGY

## 2017-02-02 ENCOUNTER — Encounter: Payer: Self-pay | Admitting: Gastroenterology

## 2017-02-02 ENCOUNTER — Ambulatory Visit: Payer: Medicare Other | Admitting: Pulmonary Disease

## 2017-02-06 DIAGNOSIS — G4733 Obstructive sleep apnea (adult) (pediatric): Secondary | ICD-10-CM | POA: Diagnosis not present

## 2017-02-19 ENCOUNTER — Other Ambulatory Visit: Payer: Self-pay | Admitting: Cardiovascular Disease

## 2017-02-23 ENCOUNTER — Telehealth: Payer: Self-pay | Admitting: Pulmonary Disease

## 2017-02-23 DIAGNOSIS — M25551 Pain in right hip: Secondary | ICD-10-CM | POA: Diagnosis not present

## 2017-02-23 DIAGNOSIS — J449 Chronic obstructive pulmonary disease, unspecified: Secondary | ICD-10-CM | POA: Diagnosis not present

## 2017-02-23 DIAGNOSIS — E669 Obesity, unspecified: Secondary | ICD-10-CM | POA: Diagnosis not present

## 2017-02-23 DIAGNOSIS — E119 Type 2 diabetes mellitus without complications: Secondary | ICD-10-CM | POA: Diagnosis not present

## 2017-02-23 DIAGNOSIS — I5022 Chronic systolic (congestive) heart failure: Secondary | ICD-10-CM | POA: Diagnosis not present

## 2017-02-23 DIAGNOSIS — I5020 Unspecified systolic (congestive) heart failure: Secondary | ICD-10-CM | POA: Diagnosis not present

## 2017-02-23 DIAGNOSIS — M25522 Pain in left elbow: Secondary | ICD-10-CM | POA: Diagnosis not present

## 2017-02-23 DIAGNOSIS — R69 Illness, unspecified: Secondary | ICD-10-CM | POA: Diagnosis not present

## 2017-02-23 DIAGNOSIS — M545 Low back pain: Secondary | ICD-10-CM | POA: Diagnosis not present

## 2017-02-23 NOTE — Telephone Encounter (Signed)
[facility] is checking the status of cpap renewal  Fax. [phone] please advise.

## 2017-02-23 NOTE — Telephone Encounter (Signed)
Paper in DS' folder to be signed.

## 2017-02-25 DIAGNOSIS — G4733 Obstructive sleep apnea (adult) (pediatric): Secondary | ICD-10-CM | POA: Diagnosis not present

## 2017-02-25 DIAGNOSIS — I259 Chronic ischemic heart disease, unspecified: Secondary | ICD-10-CM | POA: Diagnosis not present

## 2017-02-25 NOTE — Telephone Encounter (Signed)
Papers signed and faxed. Nothing further needed.

## 2017-03-02 DIAGNOSIS — M1711 Unilateral primary osteoarthritis, right knee: Secondary | ICD-10-CM | POA: Diagnosis not present

## 2017-03-02 DIAGNOSIS — S83241A Other tear of medial meniscus, current injury, right knee, initial encounter: Secondary | ICD-10-CM | POA: Diagnosis not present

## 2017-03-02 DIAGNOSIS — S83281A Other tear of lateral meniscus, current injury, right knee, initial encounter: Secondary | ICD-10-CM | POA: Diagnosis not present

## 2017-03-02 DIAGNOSIS — M7121 Synovial cyst of popliteal space [Baker], right knee: Secondary | ICD-10-CM | POA: Diagnosis not present

## 2017-03-25 DIAGNOSIS — E8940 Asymptomatic postprocedural ovarian failure: Secondary | ICD-10-CM | POA: Diagnosis not present

## 2017-03-25 DIAGNOSIS — N643 Galactorrhea not associated with childbirth: Secondary | ICD-10-CM | POA: Diagnosis not present

## 2017-03-25 DIAGNOSIS — Z90710 Acquired absence of both cervix and uterus: Secondary | ICD-10-CM | POA: Diagnosis not present

## 2017-03-25 DIAGNOSIS — D352 Benign neoplasm of pituitary gland: Secondary | ICD-10-CM | POA: Diagnosis not present

## 2017-03-27 DIAGNOSIS — D352 Benign neoplasm of pituitary gland: Secondary | ICD-10-CM | POA: Diagnosis not present

## 2017-03-28 DIAGNOSIS — I259 Chronic ischemic heart disease, unspecified: Secondary | ICD-10-CM | POA: Diagnosis not present

## 2017-03-28 DIAGNOSIS — G4733 Obstructive sleep apnea (adult) (pediatric): Secondary | ICD-10-CM | POA: Diagnosis not present

## 2017-04-01 DIAGNOSIS — E782 Mixed hyperlipidemia: Secondary | ICD-10-CM | POA: Diagnosis not present

## 2017-04-01 DIAGNOSIS — I10 Essential (primary) hypertension: Secondary | ICD-10-CM | POA: Diagnosis not present

## 2017-04-01 DIAGNOSIS — E119 Type 2 diabetes mellitus without complications: Secondary | ICD-10-CM | POA: Diagnosis not present

## 2017-04-02 ENCOUNTER — Telehealth: Payer: Self-pay | Admitting: Obstetrics and Gynecology

## 2017-04-02 NOTE — Telephone Encounter (Signed)
FYI

## 2017-04-02 NOTE — Telephone Encounter (Signed)
[facility] Referred for pap

## 2017-04-09 ENCOUNTER — Inpatient Hospital Stay: Admission: RE | Admit: 2017-04-09 | Payer: Medicare HMO | Source: Ambulatory Visit

## 2017-04-13 ENCOUNTER — Encounter
Admission: RE | Admit: 2017-04-13 | Discharge: 2017-04-13 | Disposition: A | Discharge status: Home / Self Care | Payer: Medicare HMO | Source: Ambulatory Visit | Attending: Orthopedic Surgery | Admitting: Orthopedic Surgery

## 2017-04-13 DIAGNOSIS — X58XXXD Exposure to other specified factors, subsequent encounter: Secondary | ICD-10-CM

## 2017-04-13 DIAGNOSIS — S83289D Other tear of lateral meniscus, current injury, unspecified knee, subsequent encounter: Secondary | ICD-10-CM | POA: Insufficient documentation

## 2017-04-13 DIAGNOSIS — I10 Essential (primary) hypertension: Secondary | ICD-10-CM

## 2017-04-13 DIAGNOSIS — Z5309 Procedure and treatment not carried out because of other contraindication: Secondary | ICD-10-CM | POA: Diagnosis not present

## 2017-04-13 DIAGNOSIS — S83249D Other tear of medial meniscus, current injury, unspecified knee, subsequent encounter: Secondary | ICD-10-CM | POA: Insufficient documentation

## 2017-04-13 DIAGNOSIS — Z01812 Encounter for preprocedural laboratory examination: Secondary | ICD-10-CM

## 2017-04-13 DIAGNOSIS — R61 Generalized hyperhidrosis: Secondary | ICD-10-CM

## 2017-04-13 DIAGNOSIS — R079 Chest pain, unspecified: Secondary | ICD-10-CM | POA: Insufficient documentation

## 2017-04-13 HISTORY — DX: Type 2 diabetes mellitus without complications: E11.9

## 2017-04-13 LAB — BASIC METABOLIC PANEL
ANION GAP: 11 (ref 5–15)
BUN: 17 mg/dL (ref 6–20)
CHLORIDE: 100 mmol/L — AB (ref 101–111)
CO2: 27 mmol/L (ref 22–32)
Calcium: 9.9 mg/dL (ref 8.9–10.3)
Creatinine, Ser: 1.01 mg/dL — ABNORMAL HIGH (ref 0.44–1.00)
GFR calc Af Amer: >60 mL/min (ref >60)
GLUCOSE: 84 mg/dL (ref 65–99)
POTASSIUM: 4.1 mmol/L (ref 3.5–5.1)
Sodium: 138 mmol/L (ref 135–145)

## 2017-04-13 LAB — CBC
HEMATOCRIT: 41.4 % (ref 35.0–47.0)
HEMOGLOBIN: 14 g/dL (ref 12.0–16.0)
MCH: 31.4 pg (ref 26.0–34.0)
MCHC: 33.8 g/dL (ref 32.0–36.0)
MCV: 92.8 fL (ref 80.0–100.0)
Platelets: 191 10*3/uL (ref 150–440)
RBC: 4.46 MIL/uL (ref 3.80–5.20)
RDW: 13.9 % (ref 11.5–14.5)
WBC: 7.7 10*3/uL (ref 3.6–11.0)

## 2017-04-13 NOTE — Patient Instructions (Signed)
Your procedure is scheduled on: Thursday 04/16/17 Report to [place]. 2ND FLOOR MEDICAL MALL ENTRANCE. To find out your arrival time please call [phone] between 1PM - 3PM on Wednesday 04/15/17.  Remember: Instructions that are not followed completely may result in serious medical risk, up to and including death, or upon the discretion of your surgeon and anesthesiologist your surgery may need to be rescheduled.    __X__ 1. Do not eat anything after midnight the night before your    procedure.  No gum chewing or hard candies.  You may drink clear   liquids up to 2 hours before you are scheduled to arrive at the   hospital for your procedure. Do not drink clear liquids within 2   hours of scheduled arrival to the hospital as this may lead to your   procedure being delayed or rescheduled.       Clear liquids include:   Water or Apple juice without pulp   Clear carbohydrate beverage such as Clearfast or Gatorade   Black coffee or Clear Tea (no milk, no creamer, do not add anything   to the coffee or tea)    Diabetics should only drink water   __X__ 2. No Alcohol for 24 hours before or after surgery.   ____ 3. Bring all medications with you on the day of surgery if instructed.    __X__ 4. Notify your doctor if there is any change in your medical condition     (cold, fever, infections).             __X___5. No smoking within 24 hours of your surgery.     Do not wear jewelry, make-up, hairpins, clips or nail polish.  Do not wear lotions, powders, or perfumes.   Do not shave 48 hours prior to surgery. Men may shave face and neck.  Do not bring valuables to the hospital.    [facility] is not responsible for any belongings or valuables.               Contacts, dentures or bridgework may not be worn into surgery.  Leave your suitcase in the car. After surgery it may be brought to your room.  For patients admitted to the hospital, discharge time is determined by your                 treatment team.   Patients discharged the day of surgery will not be allowed to drive home.   Please read over the following fact sheets that you were given:   MRSA Information   __X__ Take these medicines the morning of surgery with A SIP OF WATER:    1. [place]  2. ZETIA  3. ISOSORBIDE/IMDUR  4. METOPROLOL  5. PAXIL  6. CRESTOR  ____ Fleet Enema (as directed)   __X__ Use CHG Soap/SAGE wipes as directed  __X__ Use inhalers on the day of surgery  __X__ Stop metformin 2 days prior to surgery LAST DOSE TODAY   ____ Take 1/2 of usual insulin dose the night before surgery and none on the morning of surgery.   ____ Stop Coumadin/Plavix/aspirin on  YOU CAN CALL DR [facility] ABOUT ASPIRIN  __X__ Stop Anti-inflammatories such as Advil, Aleve, Ibuprofen, Motrin, Naproxen, Naprosyn, Goodies,powder, .  OK to take Tylenol.   __X__ Stop supplements, Vitamin E, Fish Oil until after surgery.    __X__ Bring C-Pap to the hospital.

## 2017-04-14 DIAGNOSIS — D352 Benign neoplasm of pituitary gland: Secondary | ICD-10-CM | POA: Diagnosis not present

## 2017-04-16 ENCOUNTER — Encounter
Admission: RE | Disposition: A | Discharge status: Home / Self Care | Payer: Self-pay | Source: Ambulatory Visit | Attending: Orthopedic Surgery

## 2017-04-16 ENCOUNTER — Encounter: Payer: Self-pay | Admitting: *Deleted

## 2017-04-16 ENCOUNTER — Ambulatory Visit: Payer: Medicare HMO | Admitting: Registered Nurse

## 2017-04-16 ENCOUNTER — Ambulatory Visit
Admission: RE | Admit: 2017-04-16 | Discharge: 2017-04-16 | Disposition: A | Discharge status: Home / Self Care | Payer: Medicare HMO | Source: Ambulatory Visit | Attending: Orthopedic Surgery | Admitting: Orthopedic Surgery

## 2017-04-16 DIAGNOSIS — R931 Abnormal findings on diagnostic imaging of heart and coronary circulation: Secondary | ICD-10-CM | POA: Diagnosis not present

## 2017-04-16 DIAGNOSIS — Z5309 Procedure and treatment not carried out because of other contraindication: Secondary | ICD-10-CM | POA: Insufficient documentation

## 2017-04-16 DIAGNOSIS — R079 Chest pain, unspecified: Secondary | ICD-10-CM | POA: Diagnosis not present

## 2017-04-16 DIAGNOSIS — R9439 Abnormal result of other cardiovascular function study: Secondary | ICD-10-CM | POA: Diagnosis not present

## 2017-04-16 DIAGNOSIS — R0789 Other chest pain: Secondary | ICD-10-CM | POA: Diagnosis not present

## 2017-04-16 LAB — GLUCOSE, CAPILLARY: Glucose-Capillary: 126 mg/dL — ABNORMAL HIGH (ref 65–99)

## 2017-04-16 SURGERY — ARTHROSCOPY, KNEE, WITH MEDIAL MENISCECTOMY
Anesthesia: Choice | Laterality: Right

## 2017-04-16 MED ORDER — BUPIVACAINE-EPINEPHRINE (PF) 0.5% -1:200000 IJ SOLN
INTRAMUSCULAR | Status: AC
  Filled 2017-04-16: qty 30

## 2017-04-16 MED ORDER — PHENYLEPHRINE HCL 10 MG/ML IJ SOLN
INTRAMUSCULAR | Status: AC
  Filled 2017-04-16: qty 1

## 2017-04-16 MED ORDER — CEFAZOLIN SODIUM-DEXTROSE 2-4 GM/100ML-% IV SOLN: 2.0000 g | Freq: Once | INTRAVENOUS | Status: DC

## 2017-04-16 MED ORDER — PROPOFOL 10 MG/ML IV BOLUS
INTRAVENOUS | Status: AC
  Filled 2017-04-16: qty 20

## 2017-04-16 MED ORDER — MIDAZOLAM HCL 2 MG/2ML IJ SOLN
INTRAMUSCULAR | Status: AC
  Filled 2017-04-16: qty 2

## 2017-04-16 MED ORDER — LIDOCAINE HCL (PF) 2 % IJ SOLN
INTRAMUSCULAR | Status: AC
  Filled 2017-04-16: qty 2

## 2017-04-16 MED ORDER — FENTANYL CITRATE (PF) 100 MCG/2ML IJ SOLN
INTRAMUSCULAR | Status: AC
Start: 2017-04-16 — End: 2017-04-16
  Filled 2017-04-16: qty 2

## 2017-04-16 MED ORDER — ACETAMINOPHEN 10 MG/ML IV SOLN
INTRAVENOUS | Status: AC
  Filled 2017-04-16: qty 100

## 2017-04-16 MED ORDER — CEFAZOLIN SODIUM-DEXTROSE 2-4 GM/100ML-% IV SOLN
INTRAVENOUS | Status: AC
  Filled 2017-04-16: qty 100

## 2017-04-16 MED ORDER — SODIUM CHLORIDE 0.9 % IV SOLN: INTRAVENOUS | Status: DC

## 2017-04-16 SURGICAL SUPPLY — 27 items
BANDAGE ACE 4X5 VEL STRL LF (GAUZE/BANDAGES/DRESSINGS) IMPLANT
BLADE INCISOR PLUS 4.5 (BLADE) IMPLANT
CHLORAPREP W/TINT 26ML (MISCELLANEOUS) ×2 IMPLANT
CUFF TOURN 24 STER (MISCELLANEOUS) IMPLANT
CUFF TOURN 30 STER DUAL PORT (MISCELLANEOUS) ×2 IMPLANT
GAUZE SPONGE 4X4 12PLY STRL (GAUZE/BANDAGES/DRESSINGS) ×2 IMPLANT
GLOVE BIOGEL PI IND STRL 7.0 (GLOVE) ×1 IMPLANT
GLOVE BIOGEL PI INDICATOR 7.0 (GLOVE) ×1
GLOVE PROTEXIS LATEX SZ 7.5 (GLOVE) ×4 IMPLANT
GLOVE SURG SYN 9.0  PF PI (GLOVE) ×1
GLOVE SURG SYN 9.0 PF PI (GLOVE) ×1 IMPLANT
GOWN SRG 2XL LVL 4 RGLN SLV (GOWNS) ×1 IMPLANT
GOWN STRL NON-REIN 2XL LVL4 (GOWNS) ×1
GOWN STRL REUS W/ TWL LRG LVL3 (GOWN DISPOSABLE) ×2 IMPLANT
GOWN STRL REUS W/TWL LRG LVL3 (GOWN DISPOSABLE) ×2
IV LACTATED RINGER IRRG 3000ML (IV SOLUTION) ×2
IV LR IRRIG 3000ML ARTHROMATIC (IV SOLUTION) ×2 IMPLANT
KIT RM TURNOVER STRD PROC AR (KITS) ×2 IMPLANT
MANIFOLD NEPTUNE II (INSTRUMENTS) ×2 IMPLANT
PACK ARTHROSCOPY KNEE (MISCELLANEOUS) ×2 IMPLANT
SET TUBE SUCT SHAVER OUTFL 24K (TUBING) ×2 IMPLANT
SET TUBE TIP INTRA-ARTICULAR (MISCELLANEOUS) ×2 IMPLANT
SUT ETHILON 4-0 (SUTURE) ×1
SUT ETHILON 4-0 FS2 18XMFL BLK (SUTURE) ×1
SUTURE ETHLN 4-0 FS2 18XMF BLK (SUTURE) ×1 IMPLANT
TUBING ARTHRO INFLOW-ONLY STRL (TUBING) ×2 IMPLANT
WAND COBLATION FLOW 50 (SURGICAL WAND) ×2 IMPLANT

## 2017-04-16 NOTE — OR Nursing (Signed)
ekg performed and Dr Andree Elk in to review - decision to reschedule procedure until pt see by Dr. [name] based on changed noted on EKG

## 2017-04-16 NOTE — H&P (Signed)
Reviewed paper H+P, will be scanned into chart. No changes noted.  

## 2017-04-16 NOTE — OR Nursing (Signed)
Pt discharge with appt for Monday April 20, 2017 at 1 pm with Murray Hodgkins PA at Dr Donivan Scull office.

## 2017-04-16 NOTE — OR Nursing (Signed)
Case cancel per Dr.Adams. Dr.Menz made aware.

## 2017-04-16 NOTE — Progress Notes (Signed)
Patient presents for preoperative evaluation Reported having chest pain, diaphoresis Recently EKG performed Interestingly T-wave abnormality through the precordial leads is unchanged Previously noted T-wave abnormality 1 and aVL has resolved, More pronounced T-wave abnormality to 3 aVF ( similar to EKG from April 2016) Given change in EKG, unclear if this is lead placement or new ischemia. Scheduled for clinic follow-up to address  If repeat EKG in our officeshows unchanged from previous studies dating back to prior office visits, Current EKG changes could be secondary to lead placement If continued T-wave abnormality/T wave inversion noted to 3 aVF, could consider ischemic workup/  Signed, Esmond Plants, MD, Ph.D [facility]

## 2017-04-16 NOTE — OR Nursing (Signed)
Dr Andree Elk in room to interview pt- pt states that last night she had cramping that started in her legs and went to her chest and she broke out in a sweat. It resolved on its own. Dr Andree Elk awaiting return phone call to discuss this episode with Dr [name]. Dr Rudene Christians notified of situation by Benjaman Lobe RN

## 2017-04-20 ENCOUNTER — Ambulatory Visit (INDEPENDENT_AMBULATORY_CARE_PROVIDER_SITE_OTHER): Payer: Medicare HMO | Admitting: Nurse Practitioner

## 2017-04-20 ENCOUNTER — Encounter: Payer: Self-pay | Admitting: Nurse Practitioner

## 2017-04-20 VITALS — BP 90/70 | HR 75 | Ht 65.0 in | Wt 212.5 lb

## 2017-04-20 DIAGNOSIS — I25119 Atherosclerotic heart disease of native coronary artery with unspecified angina pectoris: Secondary | ICD-10-CM

## 2017-04-20 DIAGNOSIS — Z01810 Encounter for preprocedural cardiovascular examination: Secondary | ICD-10-CM | POA: Diagnosis not present

## 2017-04-20 DIAGNOSIS — R0789 Other chest pain: Secondary | ICD-10-CM | POA: Diagnosis not present

## 2017-04-20 DIAGNOSIS — I739 Peripheral vascular disease, unspecified: Secondary | ICD-10-CM

## 2017-04-20 DIAGNOSIS — I10 Essential (primary) hypertension: Secondary | ICD-10-CM

## 2017-04-20 DIAGNOSIS — E782 Mixed hyperlipidemia: Secondary | ICD-10-CM

## 2017-04-20 NOTE — Patient Instructions (Addendum)
Medication Instructions:  Your physician recommends that you continue on your current medications as directed. Please refer to the Current Medication list given to you today.   Labwork: none  Testing/Procedures: Your physician has requested that you have a lexiscan myoview. For further information please visit [email or web address]. Please follow instruction sheet, as given.  [place]  Your caregiver has ordered a Stress Test with nuclear imaging. The purpose of this test is to evaluate the blood supply to your heart muscle. This procedure is referred to as a "Non-Invasive Stress Test." This is because other than having an IV started in your vein, nothing is inserted or "invades" your body. Cardiac stress tests are done to find areas of poor blood flow to the heart by determining the extent of coronary artery disease (CAD). Some patients exercise on a treadmill, which naturally increases the blood flow to your heart, while others who are  unable to walk on a treadmill due to physical limitations have a pharmacologic/chemical stress agent called Lexiscan . This medicine will mimic walking on a treadmill by temporarily increasing your coronary blood flow.   Please note: these test may take anywhere between 2-4 hours to complete  PLEASE REPORT TO [place] AT THE FIRST DESK WILL DIRECT YOU WHERE TO GO  Date of Procedure: Tuesday, Sept 25__  Arrival Time for Procedure:_____7:45am______________  Instructions regarding medication:   _xx___:  Hold metformin, metoprolol and lasix the morning of your test.  Bring your inhaler with you.   PLEASE NOTIFY THE OFFICE AT LEAST 57 HOURS IN ADVANCE IF YOU ARE UNABLE TO KEEP YOUR APPOINTMENT.  [phone] AND  PLEASE NOTIFY NUCLEAR MEDICINE AT [facility] AT LEAST 24 HOURS IN ADVANCE IF YOU ARE UNABLE TO KEEP YOUR APPOINTMENT. [phone]  How to prepare for your Myoview test:  1. Do not eat or drink after  midnight 2. No caffeine for 24 hours prior to test 3. No smoking 24 hours prior to test. 4. Your medication may be taken with water.  If your doctor stopped a medication because of this test, do not take that medication. 5. Ladies, please do not wear dresses.  Skirts or pants are appropriate. Please wear a short sleeve shirt. 6. No perfume, cologne or lotion. 7. Wear comfortable walking shoes. No heels!            Follow-Up: Your physician recommends that you schedule a follow-up appointment in: 3-4 months with Dr. [name].    Any Other Special Instructions Will Be Listed Below (If Applicable).     If you need a refill on your cardiac medications before your next appointment, please call your pharmacy.  Cardiac Nuclear Scan A cardiac nuclear scan is a test that measures blood flow to the heart when a person is resting and when he or she is exercising. The test looks for problems such as:  Not enough blood reaching a portion of the heart.  The heart muscle not working normally.  You may need this test if:  You have heart disease.  You have had abnormal lab results.  You have had heart surgery or angioplasty.  You have chest pain.  You have shortness of breath.  In this test, a radioactive dye (tracer) is injected into your bloodstream. After the tracer has traveled to your heart, an imaging device is used to measure how much of the tracer is absorbed by or distributed to various areas of your heart. This procedure is usually done at a  hospital and takes 2-4 hours. Tell a health care provider about:  Any allergies you have.  All medicines you are taking, including vitamins, herbs, eye drops, creams, and over-the-counter medicines.  Any problems you or family members have had with the use of anesthetic medicines.  Any blood disorders you have.  Any surgeries you have had.  Any medical conditions you have.  Whether you are pregnant or may be pregnant. What are  the risks? Generally, this is a safe procedure. However, problems may occur, including:  Serious chest pain and heart attack. This is only a risk if the stress portion of the test is done.  Rapid heartbeat.  Sensation of warmth in your chest. This usually passes quickly.  What happens before the procedure?  Ask your health care provider about changing or stopping your regular medicines. This is especially important if you are taking diabetes medicines or blood thinners.  Remove your jewelry on the day of the procedure. What happens during the procedure?  An IV tube will be inserted into one of your veins.  Your health care provider will inject a small amount of radioactive tracer through the tube.  You will wait for 20-40 minutes while the tracer travels through your bloodstream.  Your heart activity will be monitored with an electrocardiogram (ECG).  You will lie down on an exam table.  Images of your heart will be taken for about 15-20 minutes.  You may be asked to exercise on a treadmill or stationary bike. While you exercise, your heart's activity will be monitored with an ECG, and your blood pressure will be checked. If you are unable to exercise, you may be given a medicine to increase blood flow to parts of your heart.  When blood flow to your heart has peaked, a tracer will again be injected through the IV tube.  After 20-40 minutes, you will get back on the exam table and have more images taken of your heart.  When the procedure is over, your IV tube will be removed. The procedure may vary among health care providers and hospitals. Depending on the type of tracer used, scans may need to be repeated 3-4 hours later. What happens after the procedure?  Unless your health care provider tells you otherwise, you may return to your normal schedule, including diet, activities, and medicines.  Unless your health care provider tells you otherwise, you may increase your fluid  intake. This will help flush the contrast dye from your body. Drink enough fluid to keep your urine clear or pale yellow.  It is up to you to get your test results. Ask your health care provider, or the department that is doing the test, when your results will be ready. Summary  A cardiac nuclear scan measures the blood flow to the heart when a person is resting and when he or she is exercising.  You may need this test if you are at risk for heart disease.  Tell your health care provider if you are pregnant.  Unless your health care provider tells you otherwise, increase your fluid intake. This will help flush the contrast dye from your body. Drink enough fluid to keep your urine clear or pale yellow. This information is not intended to replace advice given to you by your health care provider. Make sure you discuss any questions you have with your health care provider. Document Released: 08/08/2004 Document Revised: 07/16/2016 Document Reviewed: 06/22/2013 Elsevier Interactive Patient Education  2017 [employer].

## 2017-04-20 NOTE — Progress Notes (Signed)
Office Visit    Patient Name: [name] Date of Encounter: 04/20/2017  Primary Care Provider:  [name], [name], MD Primary Cardiologist:  Johnny Bridge, MD   Chief Complaint    56 y/o ? with a h/o CAD s/p CABG, HTN, HL, DM, COPD, PAD, and sleep apnea, who presents today related to episodes of c/p and need for preop eval.  Past Medical History    Past Medical History:  Diagnosis Date  . (HFpEF) heart failure with preserved ejection fraction ([place])    a. 06/2016 Echo: EF 50%, no rwma, mild to mod TR.  [name] Anemia   . Arthritis   . Bacterial vaginitis   . Blood in stool   . Brachial neuritis or radiculitis NOS   . Bronchitis   . Cardiac arrhythmia   . Cervical neck pain with evidence of disc disease    C5/6 disease, MRI done late 2012 - no records available  . Chronic pain   . Cocaine abuse, in remission    clean x 24 years  . COPD (chronic obstructive pulmonary disease) ([place])   . Coronary artery disease    a. PCI of LCX 2003; b. PCI of the LAD 2012 with a (2.5 x 8 mm BMS);  c.s/p CABG 4/12:  L-LAD, S-Dx, S-OM, S-RCA (Dr. Prescott Gum);  d. 01/2012 MV: inf infarct, attenuation, no ischemia.  . Depression   . Diabetes mellitus without complication ([place])   . Family history of colon cancer   . Generalized headaches    frequent  . GERD (gastroesophageal reflux disease)   . Heart murmur   . Hematochezia   . History of cervical cancer    s/p cryotherapy  . History of drug abuse    cocaine, marijuana, clean since 1989  . History of hepatitis B    from eating undercooked liver  . History of kidney stones   . History of MI (myocardial infarction)   . Hyperlipidemia   . Hypertension   . Myocardial infarction ([place]) 2003  . PAD (peripheral artery disease) (H[facility])    s/p Right SFA atherectomy and PTA 01/15/11, normal ABIs 01/2011  . Polyp of colon   . Seasonal allergies   . Sleep apnea   . Smoking history    quit 07/2010  . Systolic CHF, chronic (H[facility])    mild: echo  08/2010 - mildly reduced EF 40-45%, mild diffuse hypokinesis  . Thyroid disease   . Urine incontinence    Past Surgical History:  Procedure Laterality Date  . CHOLECYSTECTOMY  1980  . COLONOSCOPY  2008   3 polyps  . COLONOSCOPY WITH PROPOFOL N/A 02/06/2015   Procedure: COLONOSCOPY WITH PROPOFOL;  Surgeon: Lucilla Lame, MD;  Location: [facility] ENDOSCOPY;  Service: Endoscopy;  Laterality: N/A;  . COLONOSCOPY WITH PROPOFOL N/A 01/27/2017   Procedure: COLONOSCOPY WITH PROPOFOL;  Surgeon: Lucilla Lame, MD;  Location: [facility] ENDOSCOPY;  Service: Endoscopy;  Laterality: N/A;  . CORONARY ANGIOPLASTY     w/ stent placement x2  . CORONARY ARTERY BYPASS GRAFT  2012   Dr [name]  . CORONARY STENT PLACEMENT  2003   S/P MI  . CORONARY STENT PLACEMENT  2007   [place]  . ESOPHAGOGASTRODUODENOSCOPY (EGD) WITH PROPOFOL N/A 02/06/2015   Procedure: ESOPHAGOGASTRODUODENOSCOPY (EGD) WITH PROPOFOL;  Surgeon: Lucilla Lame, MD;  Location: [facility] ENDOSCOPY;  Service: Endoscopy;  Laterality: N/A;  . ESOPHAGOGASTRODUODENOSCOPY (EGD) WITH PROPOFOL N/A 01/27/2017   Procedure: ESOPHAGOGASTRODUODENOSCOPY (EGD) WITH PROPOFOL;  Surgeon: Lucilla Lame, MD;  Location:  [facility] ENDOSCOPY;  Service: Endoscopy;  Laterality: N/A;  . FEMORAL ARTERY STENT  10/2010   right sided (Dr. Burt Knack)  . TUBAL LIGATION      Allergies  Allergies  Allergen Reactions  . Shellfish Allergy Anaphylaxis    Hard shellfish/swelling in throat  . Sulfonamide Derivatives Anaphylaxis    Swelling in throat.  . Watermelon [Citrullus Vulgaris] Other (See Comments)    Throat itchy  . Latex Rash         History of Present Illness    56 y/o ? with the above complex PMH including CAD s/p CABG x 4 in 2012, HFpEF, HTN, HL, DM, COPD, PAD s/p RSFA atherectomy and PTA in 12/2010, and sleep apnea.  She was last seen in clinic in 11/2016.  She has been having R knee pain and was scheduled for R knee surgery on 9/20, but upon presentation, she reported that she had two  episodes of chest pain the day prior and thus surgery was cancelled.  ECG showed new TWI in inferior leads with old anterolateral TWI.  Arrangements were made for f/u.  She says that on 9/19, she was @ home and had sudden left leg pain that moved into her left breast and across her chest to the right side. She broke out into a sweat and was mildly diaphoretic.  She had a spoonful of mustard and symptoms abated in about 25 mins.  She went upstairs to lay down and c/p recurred, lasting about 15 mins, and resolving spontaneously.  Since that episode, she hasn't had any recurrence like that, but she does have brief twinges of midsternal c/p, occurring just about every other day, lasting a few mins, and resolving spontaneously.  These 'twinges' of c/p have been occurring over the past year.  She denies palpitations, pnd, orthopnea, n, v, dizziness, syncope, edema, weight gain, or early satiety. Activity is very limited in the setting of deconditioning and right knee pain.  Home Medications    Prior to Admission medications   Medication Sig Start Date End Date Taking? Authorizing Provider  acetaminophen (TYLENOL) 500 MG tablet Take 1,000 mg by mouth every 6 (six) hours as needed for mild pain or headache.   Yes [provider]  albuterol (PROVENTIL HFA;VENTOLIN HFA) 108 (90 Base) MCG/ACT inhaler Inhale 2 puffs into the lungs every 6 (six) hours as needed for wheezing or shortness of breath. 04/10/16  Yes Wilhelmina Mcardle, MD  aspirin 81 MG tablet Take 1 tablet (81 mg total) by mouth daily. Patient taking differently: Take 81 mg by mouth 2 (two) times daily.  12/09/16  Yes [name], Kathlene November, MD  Azelastine HCl 0.15 % SOLN Place 2 sprays into both nostrils daily as needed (allergies).  10/31/16  Yes [provider]  cyclobenzaprine (FLEXERIL) 10 MG tablet Take 10 mg by mouth every 6 (six) hours as needed for muscle spasms.  12/29/16  Yes [provider]  esomeprazole (NEXIUM) 40 MG capsule  Take 40 mg by mouth 2 (two) times daily before a meal. Reported on 09/04/2015 05/31/15  Yes [provider]  ezetimibe (ZETIA) 10 MG tablet TAKE ONE TABLET BY MOUTH ONCE DAILY 11/24/16  Yes [name], Kathlene November, MD  fluticasone (FLONASE) 50 MCG/ACT nasal spray Place 2 sprays into both nostrils daily as needed for allergies.    Yes [provider]  furosemide (LASIX) 20 MG tablet TAKE ONE TABLET BY MOUTH ONCE DAILY FOR SWELLING. MAY TAKE ONE ADDITIONAL TABLET AS NEEDED FOR SWELLING OR  FLUID Patient taking differently: TAKE ONE TABLET BY MOUTH ONCE DAILY. 02/19/17  Yes [name], Kathlene November, MD  isosorbide mononitrate (IMDUR) 30 MG 24 hr tablet TAKE ONE TABLET BY MOUTH TWICE DAILY Patient taking differently: TAKE ONE TABLET BY MOUTH ONCE DAILY 05/26/16  Yes [name], Kathlene November, MD  KLOR-CON M10 10 MEQ tablet TAKE ONE TABLET BY MOUTH ONCE DAILY 08/12/16  Yes [name], Kathlene November, MD  lisinopril (PRINIVIL,ZESTRIL) 2.5 MG tablet Take 2.5 mg by mouth daily. 02/24/17  Yes [provider]  meclizine (ANTIVERT) 25 MG tablet Take 1 tablet (25 mg total) by mouth 2 (two) times daily. Patient taking differently: Take 25 mg by mouth 3 (three) times daily.  01/20/14  Yes Minna Merritts, MD  metFORMIN (GLUCOPHAGE) 500 MG tablet Take 500 mg by mouth 2 (two) times daily with a meal.  08/30/15  Yes [provider]  metoprolol tartrate (LOPRESSOR) 25 MG tablet TAKE ONE TABLET BY MOUTH TWICE DAILY 04/30/15  Yes [name], Kathlene November, MD  nitroGLYCERIN (NITROSTAT) 0.4 MG SL tablet Place 1 tablet (0.4 mg total) under the tongue every 5 (five) minutes as needed. 01/20/14  Yes [name], Kathlene November, MD  NON FORMULARY cpap at bedtime   Yes [provider]  PARoxetine (PAXIL) 10 MG tablet Take 10 mg by mouth daily. 02/24/17  Yes [provider]  Prenatal Vit-Fe Fumarate-FA (PRENATAL MULTIVITAMIN) TABS tablet Take 1 tablet by mouth daily at 12 noon.   Yes [provider]  rosuvastatin (CRESTOR)  20 MG tablet Take 20 mg by mouth daily.  10/31/16  Yes [provider]  umeclidinium-vilanterol (ANORO ELLIPTA) 62.5-25 MCG/INH AEPB Inhale 1 puff into the lungs daily. 05/30/16  Yes Wilhelmina Mcardle, MD    Review of Systems    C/p as outlined above.  R knee pain.  All other systems reviewed and are otherwise negative except as noted above.  Physical Exam    VS:  BP 90/70 (BP Location: Left Arm, Patient Position: Sitting, Cuff Size: Large)   Pulse 75   Ht 5\' 5"  (1.651 m)   Wt 212 lb 8 oz (96.4 kg)   BMI 35.36 kg/m  , BMI Body mass index is 35.36 kg/m. GEN: Well nourished, well developed, in no acute distress.  HEENT: normal.  Neck: Supple, no JVD, carotid bruits, or masses. Cardiac: RRR, no murmurs, rubs, or gallops. No clubbing, cyanosis, edema.  Radials/DP/PT 2+ and equal bilaterally.  Respiratory:  Respirations regular and unlabored, clear to auscultation bilaterally. GI: Soft, nontender, nondistended, BS + x 4. MS: no deformity or atrophy. Skin: warm and dry, no rash. Neuro:  Strength and sensation are intact. Psych: Normal affect.  Accessory Clinical Findings    ECG - RSR, 75, antlat twi, less pronounced inf twi  Assessment & Plan    1. Precordial chest pain/CAD:  Pt with a h/o CABG x 4 in 2012.  Neg MV in 2013.  Over the past year, she has had intermittent twinges of chest pain lasting a few mins and resolving spont, and then on 9/19, she had two episodes of more profound chest discomfort assoc with diaphoresis and mild dyspnea.  ECG on 9/20, performed as she was prepping for knee surgery, showed more pronounced inferior TWI with stable, chronic, anterolateral TWI.  Surgery was cancelled until further cards eval.  She has not had c/p similar to 9/19 since 9/19, but cont to have somewhat atypical twinges.  ECG today cont to have mild T changes in inf leads.  We will obtain a lexiscan MV to r/o ischemia.  If low risk, she will be able to proceed to surgery w/o further  eval.  Cont asa,  blocker, acei, statin.  2.  Essential HTN:  Stable.  3. HL:  Followed by pcp.  Cont statin/zetia.  4.  DM II:  Followed by PCP.  5.  PAD:  No recent claudication. Cont asa, statin, zetia.  6.  Preop eval:  See #1.  Await myoview.  7.  Dispo:  F/u myoview for c/p. F/u in clinic in 3 mos or sooner if necessary.   Murray Hodgkins, NP 04/20/2017, 2:25 PM

## 2017-04-21 ENCOUNTER — Telehealth: Payer: Self-pay | Admitting: *Deleted

## 2017-04-21 ENCOUNTER — Ambulatory Visit
Admission: RE | Admit: 2017-04-21 | Discharge: 2017-04-21 | Disposition: A | Discharge status: Home / Self Care | Payer: Medicare HMO | Source: Ambulatory Visit | Attending: Nurse Practitioner | Admitting: Nurse Practitioner

## 2017-04-21 DIAGNOSIS — R0789 Other chest pain: Secondary | ICD-10-CM | POA: Diagnosis not present

## 2017-04-21 DIAGNOSIS — R931 Abnormal findings on diagnostic imaging of heart and coronary circulation: Secondary | ICD-10-CM

## 2017-04-21 DIAGNOSIS — R9439 Abnormal result of other cardiovascular function study: Secondary | ICD-10-CM

## 2017-04-21 LAB — NM MYOCAR MULTI W/SPECT W/WALL MOTION / EF
CSEPHR: 62 %
CSEPPHR: 102 {beats}/min
Estimated workload: 1 METS
Exercise duration (min): 0 min
Exercise duration (sec): 0 s
LV dias vol: 84 mL (ref 46–106)
LVSYSVOL: 54 mL
MPHR: 162 {beats}/min
NUC STRESS TID: 1.13
Rest HR: 77 {beats}/min

## 2017-04-21 MED ORDER — TECHNETIUM TC 99M TETROFOSMIN IV KIT
32.4700 | PACK | Freq: Once | INTRAVENOUS | Status: AC | PRN
  Administered 2017-04-21: 32.47 via INTRAVENOUS

## 2017-04-21 MED ORDER — TECHNETIUM TC 99M TETROFOSMIN IV KIT
13.0000 | PACK | Freq: Once | INTRAVENOUS | Status: AC | PRN
  Administered 2017-04-21: 13.54 via INTRAVENOUS

## 2017-04-21 MED ORDER — REGADENOSON 0.4 MG/5ML IV SOLN
0.4000 mg | Freq: Once | INTRAVENOUS | Status: AC
  Administered 2017-04-21: 0.4 mg via INTRAVENOUS

## 2017-04-21 NOTE — Telephone Encounter (Signed)
Results called to pt. Pt verbalized understanding of results and plan of care. Order for echo entered. Patient aware someone will be calling her to schedule. Message routed to scheduling pool.

## 2017-04-21 NOTE — Telephone Encounter (Signed)
-----   Message from Rogelia Mire, NP sent at 04/21/2017  3:54 PM EDT ----- There is a fair amt of artifact on this study.  Prior heart attack is noted, which we expect to see, and there is also a question of a small area of poor blood flow just outside the heart attack area.  The heart squeezing function comes across as reduced.  This was previously normal.  I think it would be best for [place] to obtain an echocardiogram to confirm heart pumping function.  If it is truly reduced, than we may need to consider a heart cath with Dr. [name].

## 2017-04-24 ENCOUNTER — Other Ambulatory Visit: Payer: Self-pay

## 2017-04-24 ENCOUNTER — Ambulatory Visit (INDEPENDENT_AMBULATORY_CARE_PROVIDER_SITE_OTHER): Payer: Medicare HMO

## 2017-04-24 DIAGNOSIS — R931 Abnormal findings on diagnostic imaging of heart and coronary circulation: Secondary | ICD-10-CM

## 2017-04-24 DIAGNOSIS — R9439 Abnormal result of other cardiovascular function study: Secondary | ICD-10-CM | POA: Diagnosis not present

## 2017-04-24 DIAGNOSIS — R0789 Other chest pain: Secondary | ICD-10-CM | POA: Diagnosis not present

## 2017-04-24 LAB — ECHOCARDIOGRAM COMPLETE
AOVTI: 25.7 cm
AV Area VTI index: 1.19 cm2/m2
AV Area mean vel: 2.09 cm2
AV VEL mean LVOT/AV: 0.55
AV vel: 2.42
AVA: 2.42 cm2
AVAREAMEANVIN: 1.03 cm2/m2
AVG: 4 mmHg
Area-P 1/2: 1.68 cm2
CHL CUP DOP CALC LVOT VTI: 16.4 cm
CHL CUP MV DEC (S): 447
DOP CAL AO MEAN VELOCITY: 95.1 cm/s
E decel time: 447 msec
EERAT: 3.68
FS: 22 % — AB (ref 28–44)
IV/PV OW: 0.8
LA vol: 47.9 mL
LADIAMINDEX: 2.02 cm/m2
LASIZE: 41 mm
LAVOLA4C: 43 mL
LAVOLIN: 23.6 mL/m2
LEFT ATRIUM END SYS DIAM: 41 mm
LV E/e' medial: 3.68
LV PW d: 10 mm — AB (ref 0.6–1.1)
LV sys vol index: 25 mL/m2
LV sys vol: 51 mL — AB (ref 14–42)
LVDIAVOL: 77 mL (ref 46–106)
LVDIAVOLIN: 38 mL/m2
LVEEAVG: 3.68
LVELAT: 9.68 cm/s
LVOT area: 3.8 cm2
LVOT diameter: 22 mm
LVOTSV: 62 mL
LVOTVTI: 0.64 cm
Lateral S' vel: 6.31 cm/s
MV pk A vel: 66.5 m/s
MVPKEVEL: 35.6 m/s
P 1/2 time: 131 ms
RV TAPSE: 14.2 mm
Simpson's disk: 34
Stroke v: 27 ml
TDI e' lateral: 9.68
TDI e' medial: 6.2
Valve area index: 1.19

## 2017-04-27 DIAGNOSIS — G4733 Obstructive sleep apnea (adult) (pediatric): Secondary | ICD-10-CM | POA: Diagnosis not present

## 2017-04-27 DIAGNOSIS — I259 Chronic ischemic heart disease, unspecified: Secondary | ICD-10-CM | POA: Diagnosis not present

## 2017-05-01 DIAGNOSIS — I952 Hypotension due to drugs: Secondary | ICD-10-CM | POA: Diagnosis not present

## 2017-05-01 DIAGNOSIS — D352 Benign neoplasm of pituitary gland: Secondary | ICD-10-CM | POA: Diagnosis not present

## 2017-05-04 ENCOUNTER — Ambulatory Visit: Payer: Medicare Other | Admitting: Obstetrics and Gynecology

## 2017-05-08 ENCOUNTER — Ambulatory Visit: Payer: Medicare Other | Admitting: Obstetrics and Gynecology

## 2017-05-13 ENCOUNTER — Ambulatory Visit: Payer: Medicare Other | Admitting: Obstetrics and Gynecology

## 2017-05-15 ENCOUNTER — Ambulatory Visit (INDEPENDENT_AMBULATORY_CARE_PROVIDER_SITE_OTHER): Payer: Medicare HMO | Admitting: Obstetrics and Gynecology

## 2017-05-15 ENCOUNTER — Encounter: Payer: Self-pay | Admitting: Obstetrics and Gynecology

## 2017-05-15 VITALS — BP 122/82 | HR 78 | Ht 65.0 in | Wt 214.0 lb

## 2017-05-15 DIAGNOSIS — N951 Menopausal and female climacteric states: Secondary | ICD-10-CM | POA: Diagnosis not present

## 2017-05-15 DIAGNOSIS — Z01419 Encounter for gynecological examination (general) (routine) without abnormal findings: Secondary | ICD-10-CM | POA: Diagnosis not present

## 2017-05-15 NOTE — Progress Notes (Signed)
Obstetrics & Gynecology Office Visit   Chief Complaint  Patient presents with  . Referral    [facility] for pap    History of Present Illness: 56 y.o. [phone] female who presents for a routine breast and pelvic exam.  She receives her general medical care from [facility].  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 She states that she has had no recent vaginal bleeding.  She had a pap smear in 03/2016 that was negative with negative HPV.   She also presents to discuss hormone replacement therapy.  She has been seen in the past by me for this issue.  Given her multiple medical comorbid conditions, she is not a good candidate for estrogen therapy.  Also, given her age, she is not a good candidate for progesterone-based therapy.  We have previously discussed that hormone replacement therapy is best accomplished in the early menopausal years.  She has declined treatment with clonidine.  She has failed therapy with several SSRI and SNRI medications.  At her last visit with me she was prescribed gabapentin for treatment.  She states that she tried the medication and it gave her little to no benefit.  She continues to have severe hot flashes, however, she puts up with him since she has tried most if not all available treatment methods.   She denies breast symptoms today.  She has no concerning nodules or lumps.  She states that she does not routinely perform breast exams at home.  Her most recent mammogram was in February of this year.  It was BiRads category 1.  She has multiple, complicated medical conditions that are managed by her PCP.    Past Medical History:  Diagnosis Date  . (HFpEF) heart failure with preserved ejection fraction ([place])    a. 06/2016 Echo: EF 50%, no rwma, mild to mod TR.  [name] Anemia   . Arthritis   . Bacterial vaginitis   . Blood in stool   . Brachial neuritis or radiculitis NOS   . Bronchitis     . Cardiac arrhythmia   . Cervical neck pain with evidence of disc disease    C5/6 disease, MRI done late 2012 - no records available  . Chronic pain   . Cocaine abuse, in remission ([place])    clean x 24 years  . COPD (chronic obstructive pulmonary disease) ([place])   . Coronary artery disease    a. PCI of LCX 2003; b. PCI of the LAD 2012 with a (2.5 x 8 mm BMS);  c.s/p CABG 4/12:  L-LAD, S-Dx, S-OM, S-RCA (Dr. Prescott Gum);  d. 01/2012 MV: inf infarct, attenuation, no ischemia.  . Depression   . Diabetes mellitus without complication ([place])   . Family history of colon cancer   . Generalized headaches    frequent  . GERD (gastroesophageal reflux disease)   . Heart murmur   . Hematochezia   . History of cervical cancer    s/p cryotherapy  . History of drug abuse    cocaine, marijuana, clean since 1989  . History of hepatitis B    from eating undercooked liver  . History of kidney stones   . History of MI (myocardial infarction)   . Hyperlipidemia   . Hypertension   . Myocardial infarction ([place]) 2003  . PAD (peripheral artery disease) ([facility])    s/p Right SFA atherectomy and PTA 01/15/11, normal ABIs 01/2011  . Polyp  of colon   . Seasonal allergies   . Sleep apnea   . Smoking history    quit 07/2010  . Systolic CHF, chronic ([facility])    mild: echo 08/2010 - mildly reduced EF 40-45%, mild diffuse hypokinesis  . Thyroid disease   . Urine incontinence     Past Surgical History:  Procedure Laterality Date  . CHOLECYSTECTOMY  1980  . COLONOSCOPY  2008   3 polyps  . COLONOSCOPY WITH PROPOFOL N/A 02/06/2015   Procedure: COLONOSCOPY WITH PROPOFOL;  Surgeon: Lucilla Lame, MD;  Location: [facility] ENDOSCOPY;  Service: Endoscopy;  Laterality: N/A;  . COLONOSCOPY WITH PROPOFOL N/A 01/27/2017   Procedure: COLONOSCOPY WITH PROPOFOL;  Surgeon: Lucilla Lame, MD;  Location: [facility] ENDOSCOPY;  Service: Endoscopy;  Laterality: N/A;  . CORONARY ANGIOPLASTY     w/ stent placement x2  . CORONARY ARTERY BYPASS GRAFT   2012   Dr [name]  . CORONARY STENT PLACEMENT  2003   S/P MI  . CORONARY STENT PLACEMENT  2007   [place]  . ESOPHAGOGASTRODUODENOSCOPY (EGD) WITH PROPOFOL N/A 02/06/2015   Procedure: ESOPHAGOGASTRODUODENOSCOPY (EGD) WITH PROPOFOL;  Surgeon: Lucilla Lame, MD;  Location: [facility] ENDOSCOPY;  Service: Endoscopy;  Laterality: N/A;  . ESOPHAGOGASTRODUODENOSCOPY (EGD) WITH PROPOFOL N/A 01/27/2017   Procedure: ESOPHAGOGASTRODUODENOSCOPY (EGD) WITH PROPOFOL;  Surgeon: Lucilla Lame, MD;  Location: [facility] ENDOSCOPY;  Service: Endoscopy;  Laterality: N/A;  . FEMORAL ARTERY STENT  10/2010   right sided (Dr. Burt Knack)  . TUBAL LIGATION      Gynecologic History: No LMP recorded. Patient has had an ablation.  Obstetric History: [id number]  Family History  Problem Relation Age of Onset  . Hypertension Father   . Heart failure Father   . Diabetes Father   . Colon cancer Father 58  . Glaucoma Father   . Breast cancer Mother 32       breast cancer, late 9's  . Brain cancer Sister   . Coronary artery disease Neg Hx   . Stroke Neg Hx     Social History   Social History  . Marital status: Legally Separated    Spouse name: N/A  . Number of children: N/A  . Years of education: N/A   Occupational History  . Not on file.   Social History Main Topics  . Smoking status: Former Smoker    Packs/day: 1.00    Years: 38.00    Quit date: 08/12/2010  . Smokeless tobacco: Never Used  . Alcohol use No  . Drug use: No     Comment: Remote Hx (crack cocaine and marijuana)  . Sexual activity: Yes   Other Topics Concern  . Not on file   Social History Narrative   Caffeine: 1 cup coffee/day   Lives alone, no pets   Occupation: industrial work, prior [employer], in process of getting disability, initially denied   Edu: 11th grade   Activity: no regular exercise   Diet: good water, vegetables daily, low salt diet    Allergies  Allergen Reactions  . Shellfish Allergy Anaphylaxis    Hard shellfish/swelling in throat    . Sulfonamide Derivatives Anaphylaxis    Swelling in throat.  . Watermelon [Citrullus Vulgaris] Other (See Comments)    Throat itchy  . Latex Rash         Prior to Admission medications   Medication Sig Start Date End Date Taking? Authorizing Provider  acetaminophen (TYLENOL) 500 MG tablet Take 1,000 mg by mouth every 6 (six) hours as needed for  mild pain or headache.    [provider]  albuterol (PROVENTIL HFA;VENTOLIN HFA) 108 (90 Base) MCG/ACT inhaler Inhale 2 puffs into the lungs every 6 (six) hours as needed for wheezing or shortness of breath. 04/10/16   Wilhelmina Mcardle, MD  aspirin 81 MG tablet Take 1 tablet (81 mg total) by mouth daily. Patient taking differently: Take 81 mg by mouth 2 (two) times daily.  12/09/16   Minna Merritts, MD  Azelastine HCl 0.15 % SOLN Place 2 sprays into both nostrils daily as needed (allergies).  10/31/16   [provider]  cyclobenzaprine (FLEXERIL) 10 MG tablet Take 10 mg by mouth every 6 (six) hours as needed for muscle spasms.  12/29/16   [provider]  esomeprazole (NEXIUM) 40 MG capsule Take 40 mg by mouth 2 (two) times daily before a meal. Reported on 09/04/2015 05/31/15   [provider]  ezetimibe (ZETIA) 10 MG tablet TAKE ONE TABLET BY MOUTH ONCE DAILY 11/24/16   Minna Merritts, MD  fluticasone (FLONASE) 50 MCG/ACT nasal spray Place 2 sprays into both nostrils daily as needed for allergies.     [provider]  furosemide (LASIX) 20 MG tablet TAKE ONE TABLET BY MOUTH ONCE DAILY FOR SWELLING. MAY TAKE ONE ADDITIONAL TABLET AS NEEDED FOR SWELLING OR FLUID Patient taking differently: TAKE ONE TABLET BY MOUTH ONCE DAILY. 02/19/17   Minna Merritts, MD  isosorbide mononitrate (IMDUR) 30 MG 24 hr tablet TAKE ONE TABLET BY MOUTH TWICE DAILY Patient taking differently: TAKE ONE TABLET BY MOUTH ONCE DAILY 05/26/16   [name], Kathlene November, MD  KLOR-CON M10 10 MEQ tablet TAKE ONE TABLET BY MOUTH ONCE DAILY 08/12/16    Minna Merritts, MD  lisinopril (PRINIVIL,ZESTRIL) 2.5 MG tablet Take 2.5 mg by mouth daily. 02/24/17   [provider]  meclizine (ANTIVERT) 25 MG tablet Take 1 tablet (25 mg total) by mouth 2 (two) times daily. Patient taking differently: Take 25 mg by mouth 3 (three) times daily.  01/20/14   Minna Merritts, MD  metFORMIN (GLUCOPHAGE) 500 MG tablet Take 500 mg by mouth 2 (two) times daily with a meal.  08/30/15   [provider]  metoprolol tartrate (LOPRESSOR) 25 MG tablet TAKE ONE TABLET BY MOUTH TWICE DAILY 04/30/15   Minna Merritts, MD  nitroGLYCERIN (NITROSTAT) 0.4 MG SL tablet Place 1 tablet (0.4 mg total) under the tongue every 5 (five) minutes as needed. 01/20/14   Minna Merritts, MD  NON FORMULARY cpap at bedtime    [provider]  PARoxetine (PAXIL) 10 MG tablet Take 10 mg by mouth daily. 02/24/17   [provider]  Prenatal Vit-Fe Fumarate-FA (PRENATAL MULTIVITAMIN) TABS tablet Take 1 tablet by mouth daily at 12 noon.    [provider]  rosuvastatin (CRESTOR) 20 MG tablet Take 20 mg by mouth daily.  10/31/16   [provider]  umeclidinium-vilanterol (ANORO ELLIPTA) 62.5-25 MCG/INH AEPB Inhale 1 puff into the lungs daily. 05/30/16   Wilhelmina Mcardle, MD    Review of Systems  Constitutional: Negative.   HENT: Negative.   Eyes: Negative.   Respiratory: Negative.   Cardiovascular: Negative.   Gastrointestinal: Negative.   Genitourinary: Negative.   Musculoskeletal: Negative.   Skin: Negative.   Neurological: Negative.   Psychiatric/Behavioral: Negative.      Physical Exam BP 122/82 (BP Location: Left Arm, Patient Position: Sitting, Cuff Size: Normal)   Pulse 78   Ht 5\' 5"  (1.651  m)   Wt 214 lb (97.1 kg)   BMI 35.61 kg/m  No LMP recorded. Patient has had an ablation. Physical Exam  Constitutional: She is oriented to person, place, and time. She appears well-developed and well-nourished. No distress.   Genitourinary: Vagina normal. Pelvic exam was performed with patient supine. There is no rash, tenderness or lesion on the right labia. There is no rash, tenderness or lesion on the left labia. Vagina exhibits no lesion. No erythema, tenderness or bleeding in the vagina. No signs of injury around the vagina. Right adnexum does not display mass, does not display tenderness and does not display fullness. Left adnexum does not display mass, does not display tenderness and does not display fullness. Cervix does not exhibit motion tenderness, lesion or polyp.  Genitourinary Comments: Uterus is surgically absent.  Bimanual pelvic exam is limited by the patient's body habitus.   HENT:  Head: Normocephalic and atraumatic.  Eyes: EOM are normal. No scleral icterus.  Neck: Normal range of motion. Neck supple. No thyromegaly present.  Cardiovascular: Normal rate and regular rhythm.   Pulmonary/Chest: Effort normal and breath sounds normal. No respiratory distress. She has no wheezes. She has no rales.  Abdominal: Soft. Bowel sounds are normal. She exhibits no distension and no mass. There is no tenderness. There is no rebound and no guarding.  Musculoskeletal: Normal range of motion. She exhibits no edema.  Lymphadenopathy:    She has no cervical adenopathy.  Neurological: She is alert and oriented to person, place, and time. No cranial nerve deficit.  Skin: Skin is warm and dry. No erythema.  Psychiatric: She has a normal mood and affect. Her behavior is normal. Judgment normal.    Female chaperone present for pelvic and breast  portions of the physical exam  Assessment: 56 y.o. [id number] female here for  1. Women's annual routine gynecological examination   2. Vasomotor symptoms due to menopause      Plan: Problem List Items Addressed This Visit    None    Visit Diagnoses    Women's annual routine gynecological examination    -  Primary   Vasomotor symptoms due to menopause          Screening: -- Blood pressure screen managed by PCP -- Colonoscopy - managed per PCP -- Mammogram - up to date as of 08/2016, next due in 08/2017.  No issues identified on exam today.  -- Weight screening: obese. Management per PCP -- Depression screening negative (PHQ-9) -- Nutrition: normal -- cholesterol screening: per PCP -- osteoporosis screening: not due -- tobacco screening: former smoker.  PCP to determine if patient eligible for screening CT scan.  -- alcohol screening: AUDIT questionnaire indicates low-risk usage. -- family history of breast cancer screening: done. not at high risk. -- no evidence of domestic violence or intimate partner violence. -- STD screening: gonorrhea/chlamydia NAAT not collected per patient request. -- pap smear not collected per ASCCP guidelines -- flu vaccine due -- HPV vaccination series: not eligible due to age.   Vasomotor symptoms due to menopause:  No recommendations to add at this time. Patient has had all available options offered, none of which have appeared to have made much of a difference in her symptoms.  We discussed the natural course of these symptoms in an untreated patient.  In the majority of patients these symptoms will improve in time.  She voiced understanding of this.    Prentice Docker, MD 05/16/2017 11:35 AM

## 2017-05-19 DIAGNOSIS — E237 Disorder of pituitary gland, unspecified: Secondary | ICD-10-CM | POA: Diagnosis not present

## 2017-05-20 DIAGNOSIS — R69 Illness, unspecified: Secondary | ICD-10-CM | POA: Diagnosis not present

## 2017-05-28 DIAGNOSIS — I259 Chronic ischemic heart disease, unspecified: Secondary | ICD-10-CM | POA: Diagnosis not present

## 2017-05-28 DIAGNOSIS — G4733 Obstructive sleep apnea (adult) (pediatric): Secondary | ICD-10-CM | POA: Diagnosis not present

## 2017-06-02 DIAGNOSIS — H2513 Age-related nuclear cataract, bilateral: Secondary | ICD-10-CM | POA: Diagnosis not present

## 2017-06-02 DIAGNOSIS — H401131 Primary open-angle glaucoma, bilateral, mild stage: Secondary | ICD-10-CM | POA: Diagnosis not present

## 2017-06-02 DIAGNOSIS — H53453 Other localized visual field defect, bilateral: Secondary | ICD-10-CM | POA: Diagnosis not present

## 2017-06-10 ENCOUNTER — Ambulatory Visit: Payer: Medicare HMO | Admitting: Cardiovascular Disease

## 2017-06-16 ENCOUNTER — Emergency Department: Payer: Medicare HMO

## 2017-06-16 ENCOUNTER — Encounter: Payer: Self-pay | Admitting: Emergency Medicine

## 2017-06-16 ENCOUNTER — Emergency Department
Admission: EM | Admit: 2017-06-16 | Discharge: 2017-06-16 | Disposition: A | Discharge status: Home / Self Care | Payer: Medicare HMO | Attending: Emergency Medicine | Admitting: Emergency Medicine

## 2017-06-16 DIAGNOSIS — Z8541 Personal history of malignant neoplasm of cervix uteri: Secondary | ICD-10-CM | POA: Insufficient documentation

## 2017-06-16 DIAGNOSIS — I5023 Acute on chronic systolic (congestive) heart failure: Secondary | ICD-10-CM | POA: Insufficient documentation

## 2017-06-16 DIAGNOSIS — Z87891 Personal history of nicotine dependence: Secondary | ICD-10-CM | POA: Diagnosis not present

## 2017-06-16 DIAGNOSIS — Z7984 Long term (current) use of oral hypoglycemic drugs: Secondary | ICD-10-CM | POA: Insufficient documentation

## 2017-06-16 DIAGNOSIS — Z951 Presence of aortocoronary bypass graft: Secondary | ICD-10-CM | POA: Diagnosis not present

## 2017-06-16 DIAGNOSIS — I10 Essential (primary) hypertension: Secondary | ICD-10-CM | POA: Diagnosis not present

## 2017-06-16 DIAGNOSIS — Z79899 Other long term (current) drug therapy: Secondary | ICD-10-CM | POA: Insufficient documentation

## 2017-06-16 DIAGNOSIS — E119 Type 2 diabetes mellitus without complications: Secondary | ICD-10-CM | POA: Insufficient documentation

## 2017-06-16 DIAGNOSIS — R079 Chest pain, unspecified: Secondary | ICD-10-CM | POA: Diagnosis not present

## 2017-06-16 DIAGNOSIS — J449 Chronic obstructive pulmonary disease, unspecified: Secondary | ICD-10-CM | POA: Insufficient documentation

## 2017-06-16 DIAGNOSIS — R0789 Other chest pain: Secondary | ICD-10-CM | POA: Diagnosis not present

## 2017-06-16 DIAGNOSIS — Z91040 Latex allergy status: Secondary | ICD-10-CM | POA: Insufficient documentation

## 2017-06-16 LAB — FIBRIN DERIVATIVES D-DIMER (ARMC ONLY): Fibrin derivatives D-dimer (ARMC): 603.92 ng/mL (FEU) — ABNORMAL HIGH (ref 0.00–499.00)

## 2017-06-16 LAB — CBC
HCT: 40.8 % (ref 35.0–47.0)
HEMOGLOBIN: 13.5 g/dL (ref 12.0–16.0)
MCH: 30.7 pg (ref 26.0–34.0)
MCHC: 33.1 g/dL (ref 32.0–36.0)
MCV: 92.6 fL (ref 80.0–100.0)
PLATELETS: 184 10*3/uL (ref 150–440)
RBC: 4.41 MIL/uL (ref 3.80–5.20)
RDW: 13.2 % (ref 11.5–14.5)
WBC: 6.5 10*3/uL (ref 3.6–11.0)

## 2017-06-16 LAB — BASIC METABOLIC PANEL
Anion gap: 9 (ref 5–15)
BUN: 11 mg/dL (ref 6–20)
CALCIUM: 9.5 mg/dL (ref 8.9–10.3)
CO2: 22 mmol/L (ref 22–32)
CREATININE: 0.92 mg/dL (ref 0.44–1.00)
Chloride: 105 mmol/L (ref 101–111)
GFR calc Af Amer: >60 mL/min (ref >60)
GFR calc non Af Amer: >60 mL/min (ref >60)
GLUCOSE: 102 mg/dL — AB (ref 65–99)
Potassium: 3.2 mmol/L — ABNORMAL LOW (ref 3.5–5.1)
Sodium: 136 mmol/L (ref 135–145)

## 2017-06-16 LAB — TROPONIN I: Troponin I: <0.03 ng/mL (ref <0.03)

## 2017-06-16 MED ORDER — IBUPROFEN 800 MG PO TABS: 800.0000 mg | ORAL_TABLET | Freq: Three times a day (TID) | ORAL | 0 refills | Status: DC | PRN

## 2017-06-16 MED ORDER — DIAZEPAM 5 MG PO TABS
5.0000 mg | ORAL_TABLET | Freq: Once | ORAL | Status: AC
  Administered 2017-06-16: 5 mg via ORAL
  Filled 2017-06-16: qty 1

## 2017-06-16 MED ORDER — HYDROMORPHONE HCL 1 MG/ML IJ SOLN
0.5000 mg | Freq: Once | INTRAMUSCULAR | Status: AC
  Administered 2017-06-16: 0.5 mg via INTRAVENOUS
  Filled 2017-06-16: qty 1

## 2017-06-16 MED ORDER — IOPAMIDOL (ISOVUE-370) INJECTION 76%
75.0000 mL | Freq: Once | INTRAVENOUS | Status: AC | PRN
  Administered 2017-06-16: 75 mL via INTRAVENOUS

## 2017-06-16 MED ORDER — DIAZEPAM 5 MG PO TABS: 5.0000 mg | ORAL_TABLET | Freq: Three times a day (TID) | ORAL | 0 refills | Status: DC | PRN

## 2017-06-16 NOTE — ED Provider Notes (Signed)
[facility] Emergency Department Provider Note   ____________________________________________   First MD Initiated Contact with Patient 06/16/17 1413     (approximate)  I have reviewed the triage vital signs and the nursing notes.   HISTORY  Chief Complaint Chest Pain    HPI [name] is a 56 y.o. female Comes in with right-sided chest pain rating from the back area of the rhomboids and the scapular down the right arm and around into the axilla and the right side of the chest. It is worse with movement and worse with deep breathing. Does not seem to be worse with exercise. Patient reports it does not feel anything like her to previous MIs. She does have a new reversible defect noted on her scan recently. However she insists that this does not feel like her heartattacks.   Past Medical History:  Diagnosis Date  . (HFpEF) heart failure with preserved ejection fraction ([place])    a. 06/2016 Echo: EF 50%, no rwma, mild to mod TR.  [name] Anemia   . Arthritis   . Bacterial vaginitis   . Blood in stool   . Brachial neuritis or radiculitis NOS   . Bronchitis   . Cardiac arrhythmia   . Cervical neck pain with evidence of disc disease    C5/6 disease, MRI done late 2012 - no records available  . Chronic pain   . Cocaine abuse, in remission ([place])    clean x 24 years  . COPD (chronic obstructive pulmonary disease) ([place])   . Coronary artery disease    a. PCI of LCX 2003; b. PCI of the LAD 2012 with a (2.5 x 8 mm BMS);  c.s/p CABG 4/12:  L-LAD, S-Dx, S-OM, S-RCA (Dr. Prescott Gum);  d. 01/2012 MV: inf infarct, attenuation, no ischemia.  . Depression   . Diabetes mellitus without complication ([place])   . Family history of colon cancer   . Generalized headaches    frequent  . GERD (gastroesophageal reflux disease)   . Heart murmur   . Hematochezia   . History of cervical cancer    s/p cryotherapy  . History of drug abuse    cocaine, marijuana, clean since 1989    . History of hepatitis B    from eating undercooked liver  . History of kidney stones   . History of MI (myocardial infarction)   . Hyperlipidemia   . Hypertension   . Myocardial infarction ([place]) 2003  . PAD (peripheral artery disease) (HCC)    s/p Right SFA atherectomy and PTA 01/15/11, normal ABIs 01/2011  . Polyp of colon   . Seasonal allergies   . Sleep apnea   . Smoking history    quit 07/2010  . Systolic CHF, chronic (HCC)    mild: echo 08/2010 - mildly reduced EF 40-45%, mild diffuse hypokinesis  . Thyroid disease   . Urine incontinence     Patient Active Problem List   Diagnosis Date Noted  . Abnormal feces   . Benign neoplasm of ascending colon   . Intractable vomiting with nausea   . Acute esophagogastric ulcer   . Gastritis without bleeding   . Vasomotor flushing 11/24/2016  . Morbid obesity ([place]) 10/29/2015  . Back ache 06/19/2015  . Colon polyp 06/19/2015  . CCF (congestive cardiac failure) ([place]) 06/19/2015  . Family history of colon cancer 06/19/2015  . Orthostatic hypotension 04/30/2015  . Hematochezia 01/11/2015  . History of colonic polyps 01/11/2015  . Atypical chest pain  01/20/2014  . Diarrhea 06/03/2013  . Bronchitis 01/03/2013  . Right knee pain 12/20/2011  . HTN (hypertension) 12/20/2011  . Systolic CHF, chronic ([place])   . History of cervical cancer   . Depression   . GERD (gastroesophageal reflux disease)   . Seasonal allergies   . History of drug abuse   . Neck pain 05/07/2011  . PAD (peripheral artery disease) ([place]) 02/03/2011  . 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 Smoking history 12/16/2010  . Hyperlipidemia 08/23/2010  . Coronary atherosclerosis of native coronary artery 08/23/2010  . CLAUDICATION 08/23/2010  . CAROTID BRUIT 08/23/2010  . SHORTNESS OF BREATH 08/23/2010    Past Surgical History:  Procedure Laterality Date  . CHOLECYSTECTOMY  1980  . COLONOSCOPY  2008   3 polyps  . COLONOSCOPY WITH PROPOFOL N/A 02/06/2015   Procedure:  COLONOSCOPY WITH PROPOFOL;  Surgeon: Lucilla Lame, MD;  Location: [facility] ENDOSCOPY;  Service: Endoscopy;  Laterality: N/A;  . COLONOSCOPY WITH PROPOFOL N/A 01/27/2017   Procedure: COLONOSCOPY WITH PROPOFOL;  Surgeon: Lucilla Lame, MD;  Location: [facility] END[facility]COPY;  Service: Endoscopy;  Laterality: N/A;  . CORONARY ANGIOPLASTY     w/ stent placement x2  . CORONARY ARTERY BYPASS GRAFT  2012   Dr [name]  . CORONARY STENT PLACEMENT  2003   S/P MI  . CORONARY STENT PLACEMENT  2007   [place]  . ESOPHAGOGASTRODUODENOSCOPY (EGD) WITH PROPOFOL N/A 02/06/2015   Procedure: ESOPHAGOGASTRODUODENOSCOPY (EGD) WITH PROPOFOL;  Surgeon: Lucilla Lame, MD;  Location: [facility] ENDOSCOPY;  Service: Endoscopy;  Laterality: N/A;  . ESOPHAGOGASTRODUODENOSCOPY (EGD) WITH PROPOFOL N/A 01/27/2017   Procedure: ESOPHAGOGASTRODUODENOSCOPY (EGD) WITH PROPOFOL;  Surgeon: Lucilla Lame, MD;  Location: [facility] ENDOSCOPY;  Service: Endoscopy;  Laterality: N/A;  . FEMORAL ARTERY STENT  10/2010   right sided (Dr. Burt Knack)  . TUBAL LIGATION      Prior to Admission medications   Medication Sig Start Date End Date Taking? Authorizing Provider  aspirin 81 MG tablet Take 1 tablet (81 mg total) by mouth daily. Patient taking differently: Take 81 mg by mouth 2 (two) times daily.  12/09/16  Yes Minna Merritts, MD  esomeprazole (NEXIUM) 40 MG capsule Take 40 mg by mouth 2 (two) times daily before a meal. Reported on 09/04/2015 05/31/15  Yes [provider]  fluticasone (FLONASE) 50 MCG/ACT nasal spray Place 2 sprays into both nostrils daily as needed for allergies.    Yes [provider]  furosemide (LASIX) 20 MG tablet TAKE ONE TABLET BY MOUTH ONCE DAILY FOR SWELLING. MAY TAKE ONE ADDITIONAL TABLET AS NEEDED FOR SWELLING OR FLUID Patient taking differently: TAKE ONE TABLET BY MOUTH ONCE DAILY. 02/19/17  Yes [name], Kathlene November, MD  isosorbide mononitrate (IMDUR) 30 MG 24 hr tablet TAKE ONE TABLET BY MOUTH TWICE DAILY Patient taking  differently: TAKE ONE TABLET BY MOUTH ONCE DAILY 05/26/16  Yes [name], Kathlene November, MD  KLOR-CON M10 10 MEQ tablet TAKE ONE TABLET BY MOUTH ONCE DAILY 08/12/16  Yes [name], Kathlene November, MD  meclizine (ANTIVERT) 25 MG tablet Take 1 tablet (25 mg total) by mouth 2 (two) times daily. Patient taking differently: Take 25 mg by mouth 3 (three) times daily.  01/20/14  Yes Minna Merritts, MD  metFORMIN (GLUCOPHAGE) 500 MG tablet Take 500 mg by mouth 2 (two) times daily with a meal.  08/30/15  Yes [provider]  metoprolol tartrate (LOPRESSOR) 25 MG tablet TAKE ONE TABLET BY MOUTH TWICE DAILY 04/30/15  Yes [name], Kathlene November, MD  PARoxetine (PAXIL) 10 MG tablet Take 10 mg by mouth daily. 02/24/17  Yes [provider]  rosuvastatin (CRESTOR) 20 MG tablet Take 20 mg by mouth daily.  10/31/16  Yes [provider]  umeclidinium-vilanterol (ANORO ELLIPTA) 62.5-25 MCG/INH AEPB Inhale 1 puff into the lungs daily. 05/30/16  Yes Wilhelmina Mcardle, MD  vitamin B-12 (CYANOCOBALAMIN) 500 MCG tablet Take 500 mcg by mouth daily.   Yes [provider]  acetaminophen (TYLENOL) 500 MG tablet Take 1,000 mg by mouth every 6 (six) hours as needed for mild pain or headache.    [provider]  albuterol (PROVENTIL HFA;VENTOLIN HFA) 108 (90 Base) MCG/ACT inhaler Inhale 2 puffs into the lungs every 6 (six) hours as needed for wheezing or shortness of breath. 04/10/16   Wilhelmina Mcardle, MD  Azelastine HCl 0.15 % SOLN Place 2 sprays into both nostrils daily as needed (allergies).  10/31/16   [provider]  ezetimibe (ZETIA) 10 MG tablet TAKE ONE TABLET BY MOUTH ONCE DAILY Patient not taking: Reported on 06/16/2017 11/24/16   Minna Merritts, MD  nitroGLYCERIN (NITROSTAT) 0.4 MG SL tablet Place 1 tablet (0.4 mg total) under the tongue every 5 (five) minutes as needed. 01/20/14   Minna Merritts, MD    Allergies Shellfish allergy; Sulfonamide derivatives; Watermelon [citrullus vulgaris];  and Latex  Family History  Problem Relation Age of Onset  . Hypertension Father   . Heart failure Father   . Diabetes Father   . Colon cancer Father 22  . Glaucoma Father   . Breast cancer Mother 34       breast cancer, late 52's  . Brain cancer Sister   . Coronary artery disease Neg Hx   . Stroke Neg Hx     Social History Social History   Tobacco Use  . Smoking status: Former Smoker    Packs/day: 1.00    Years: 38.00    Pack years: 38.00    Last attempt to quit: 08/12/2010    Years since quitting: 6.8  . Smokeless tobacco: Never Used  Substance Use Topics  . Alcohol use: No  . Drug use: No    Comment: Remote Hx (crack cocaine and marijuana)    Review of Systems  Constitutional: No fever/chills Eyes: No visual changes. ENT: No sore throat. Cardiovascular:  chest pain. Respiratory: Denies shortness of breath. Gastrointestinal: No abdominal pain.  No nausea, no vomiting.  No diarrhea.  No constipation. Genitourinary: Negative for dysuria. Musculoskeletal: Negative for lowerback pain. Skin: Negative for rash. Neurological: Negative for headaches, focal weakness ____________________________________________   PHYSICAL EXAM:  VITAL SIGNS: ED Triage Vitals [06/16/17 1156]  Enc Vitals Group     BP (!) 144/95     Pulse Rate 73     Resp 16     Temp 97.9 F (36.6 C)     Temp Source Oral     SpO2 98 %     Weight 211 lb (95.7 kg)     Height 5\' 5"  (1.651 m)     Head Circumference      Peak Flow      Pain Score      Pain Loc      Pain Edu?      Excl. in [place]?   ]\ Constitutional: Alert and oriented. Well appearing and in no acute distress. Eyes: Conjunctivae are normal. . Head: Atraumatic. Nose: No congestion/rhinnorhea. Mouth/Throat: Mucous membranes are moist.  Oropharynx non-erythematous. Neck: No stridor.  there is  some pain on the right side of the neck and spine to palpation seems to reproduce her pain Cardiovascular: Normal rate, regular rhythm. Grossly  normal heart sounds.  Good peripheral circulation. Respiratory: Normal respiratory effort.  No retractions. Lungs CTAB. Gastrointestinal: Soft and nontender. No distention. No abdominal bruits. No CVA tenderness. Musculoskeletal: No lower extremity tenderness nor edema.  No joint effusions. Neurologic:  Normal speech and language. No gross focal neurologic deficits are appreciated. No gait instability. Skin:  Skin is warm, dry and intact. No rash noted. Psychiatric: Mood and affect are normal. Speech and behavior are normal.  ____________________________________________   LABS (all labs ordered are listed, but only abnormal results are displayed)  Labs Reviewed  BASIC METABOLIC PANEL - Abnormal; Notable for the following components:      Result Value   Potassium 3.2 (*)    Glucose, Bld 102 (*)    All other components within normal limits  FIBRIN DERIVATIVES D-DIMER ([facility] ONLY) - Abnormal; Notable for the following components:   Fibrin derivatives D-dimer [facility]) 603.92 (*)    All other components within normal limits  CBC  TROPONIN I  TROPONIN I   ____________________________________________  EKG  EKG read and interpreted by me shows normal sinus rhythm rate of 74 normal axis there are flipped T's inferiorly and in the precordial leads as well. This EKG is slightly worse than one from 24 September of this year but looks very similar to the one from 20 September of this year. ____________________________________________  RADIOLOGY  Dg Chest 2 View  Result Date: 06/16/2017 CLINICAL DATA:  Chest pain. EXAM: CHEST  2 VIEW COMPARISON:  Radiographs of May 16, 2016. FINDINGS: The heart size and mediastinal contours are within normal limits. Status post coronary artery bypass graft. Atherosclerosis of thoracic aorta is noted. Right lung is clear. Mild left basilar subsegmental atelectasis is noted. No pneumothorax or pleural effusion is noted. The visualized skeletal structures are  unremarkable. IMPRESSION: Aortic atherosclerosis.  Mild left basilar subsegmental atelectasis. Electronically Signed   By: Marijo Conception, M.D.   On: 06/16/2017 12:37   chest x-ray only shows some atelectasis ____________________________________________   PROCEDURES  Procedure(s) performed:  Procedures  Critical Care performed:   ____________________________________________   INITIAL IMPRESSION / ASSESSMENT AND PLAN / ED COURSE old records in the hospital and care everywhere were reviewed the heart scan was found as noted above in history of present illness  Labs are pending. Dr Jimmye Norman will follow up. The pain does not sound cardiac at this point     ____________________________________________   FINAL CLINICAL IMPRESSION(S) / ED DIAGNOSES  Final diagnoses:  Chest pain, unspecified type     ED Discharge Orders    None       Note:  This document was prepared using Dragon voice recognition software and may include unintentional dictation errors.    Nena Polio, MD 06/16/17 [phone]

## 2017-06-16 NOTE — ED Notes (Signed)
CT notified that patient had IV placed

## 2017-06-16 NOTE — ED Notes (Signed)
Unsuccessful attempt to start 20G IV by this RN. Blood able to be collected and sent to lab.

## 2017-06-16 NOTE — ED Provider Notes (Addendum)
CT angiogram of the chest is negative for acute process.  Repeat troponin was also negative.  Clinically there appears to be musculoskeletal pain and spasm in the back.  She will be given pain medicine as well as muscle relaxants, she is stable for outpatient follow-up.   Earleen Newport, MD 06/16/17 1744    Earleen Newport, MD 06/16/17 [phone]

## 2017-06-16 NOTE — ED Notes (Signed)
Pt alert and oriented X4, active, cooperative, pt in NAD. RR even and unlabored, color WNL.  Pt informed to return if any life threatening symptoms occur.  Discharge and followup instructions reviewed.  

## 2017-06-16 NOTE — ED Triage Notes (Signed)
Patient presents to ED via POV from home with c/o right sided chest pain, radiating down her right arm. Hx of quadruple bipass.

## 2017-06-17 ENCOUNTER — Other Ambulatory Visit: Payer: Self-pay | Admitting: Neurosurgery

## 2017-06-17 DIAGNOSIS — E236 Other disorders of pituitary gland: Secondary | ICD-10-CM

## 2017-06-27 DIAGNOSIS — G4733 Obstructive sleep apnea (adult) (pediatric): Secondary | ICD-10-CM | POA: Diagnosis not present

## 2017-06-27 DIAGNOSIS — I259 Chronic ischemic heart disease, unspecified: Secondary | ICD-10-CM | POA: Diagnosis not present

## 2017-06-30 DIAGNOSIS — H401131 Primary open-angle glaucoma, bilateral, mild stage: Secondary | ICD-10-CM | POA: Diagnosis not present

## 2017-07-02 DIAGNOSIS — E119 Type 2 diabetes mellitus without complications: Secondary | ICD-10-CM | POA: Diagnosis not present

## 2017-07-02 DIAGNOSIS — I2510 Atherosclerotic heart disease of native coronary artery without angina pectoris: Secondary | ICD-10-CM | POA: Diagnosis not present

## 2017-07-02 DIAGNOSIS — E782 Mixed hyperlipidemia: Secondary | ICD-10-CM | POA: Diagnosis not present

## 2017-07-02 DIAGNOSIS — I10 Essential (primary) hypertension: Secondary | ICD-10-CM | POA: Diagnosis not present

## 2017-07-09 NOTE — Progress Notes (Signed)
Cardiology Office Note  Date:  07/10/2017   ID:  [name], DOB 1961-04-03, MRN [id number]  PCP:  Perrin Maltese, MD   Chief Complaint  Patient presents with  . other    3 month f/u c/o muscle spasms radiating to chest. Meds reviewed verbally with pt.    HPI:  57 yo woman with a  long smoking history,   CAD s/p  NSTEMI at [facility]  1/12, with BMS to pLAD (80% stenosis),  residual severe stenosis of the RCA/nondominant small vessel,  continued chest pain,  cardiac catheterization showing distal left main stenosis involving a high grade ostial LAD stenosis and circumflex disease approximately 90%, 90% diagonal disease, 80% nondominant RCA disease with ejection fraction 45%,  CABG x 4 in 2012 with a LIMA to the LAD, vein graft to the diagonal, vein graft to the marginal and vein graft to the RCA,   peripheral vascular disease,  S/p atherectomy and PTA of her right SFA on 01/15/11,   outpatient stress test  July 2013 , OSA who presents for routine followup of her coronary artery disease  Chest tight, shoulder, arm, all musculoskeletal Sees chiropractic. Was in the ER 06/16/2017: back spasms  Was in the ER: 06/16/2017, chest pain Elevated d-dimer Ct chest no PE Hospital records reviewed with the patient in detail  Stress test 04/21/2017: Artifact, grossly no large regions of ischemia  Echo with EF 40 to 45%  On lasix every other day and as needed for SOB  Feels that she needs a  total knee replacement but this has been put on hold   Blood in stool, see Dr. Allen Norris for EGD 01/01/2017  Headaches, previous MRI, MRI reviewed showing enlargement of right side of pituitary, lesion 1 cm "Cholesterol high", compliant with her Crestor 20 and Zetia   reports having diagnosis of OSA   denies any symptoms of chest pain, no angina Continued back pain, History of epidural shots   no exercise program   EKG personally reviewed by myself on todays visit Shows normal sinus rhythm with rate  56 bpm T-wave abnormality precordial leads  Other past medical history Prior  outpatient stress test  July 2013 showed significant breast attenuation artifact, inferior wall artifact from GI uptake. Overall no significant ischemia. History of chronic left arm and upper left chest  pain and was seen pain clinic. Prior diagnosis of carpal tunnel syndrome  occasional left breast pain radiating to her mediastinum.    Previously seen by ear nose throat for vestibular testing as a cause of her dizziness.  EGD and colonoscopy which by her report showed hiatal hernia. Prior problems with swallowing  She has had MRI of the neck showing cervical disc disease  Lab work shows total cholesterol 151, LDL 79, HDL 52, hemoglobin A1c 6.1  Echocardiogram in the hospital showed normal LV systolic function ejection fraction 50-55%, mild MR, mild to moderate TR with right ventricular systolic pressures 65-78  PMH:   has a past medical history of (HFpEF) heart failure with preserved ejection fraction (HCC), Anemia, Arthritis, Bacterial vaginitis, Blood in stool, Brachial neuritis or radiculitis NOS, Bronchitis, Cardiac arrhythmia, Cervical neck pain with evidence of disc disease, Chronic pain, Cocaine abuse, in remission ([place]), COPD (chronic obstructive pulmonary disease) ([place]), Coronary artery disease, Depression, Diabetes mellitus without complication ([place]), Family history of colon cancer, Generalized headaches, GERD (gastroesophageal reflux disease), Heart murmur, Hematochezia, History of cervical cancer, History of drug abuse, History of hepatitis B, History of kidney stones, History of  MI (myocardial infarction), Hyperlipidemia, Hypertension, Myocardial infarction ([place]) (2003), PAD (peripheral artery disease) ([place]), Polyp of colon, Seasonal allergies, Sleep apnea, Smoking history, Systolic CHF, chronic ([place]), Thyroid disease, and Urine incontinence.  PSH:    Past Surgical History:  Procedure Laterality Date  .  CHOLECYSTECTOMY  1980  . COLONOSCOPY  2008   3 polyps  . COLONOSCOPY WITH PROPOFOL N/A 02/06/2015   Procedure: COLONOSCOPY WITH PROPOFOL;  Surgeon: Lucilla Lame, MD;  Location: [facility] ENDOSCOPY;  Service: Endoscopy;  Laterality: N/A;  . COLONOSCOPY WITH PROPOFOL N/A 01/27/2017   Procedure: COLONOSCOPY WITH PROPOFOL;  Surgeon: Lucilla Lame, MD;  Location: [facility] ENDOSCOPY;  Service: Endoscopy;  Laterality: N/A;  . CORONARY ANGIOPLASTY     w/ stent placement x2  . CORONARY ARTERY BYPASS GRAFT  2012   Dr [name]  . CORONARY STENT PLACEMENT  2003   S/P MI  . CORONARY STENT PLACEMENT  2007   [place]  . ESOPHAGOGASTRODUODENOSCOPY (EGD) WITH PROPOFOL N/A 02/06/2015   Procedure: ESOPHAGOGASTRODUODENOSCOPY (EGD) WITH PROPOFOL;  Surgeon: Lucilla Lame, MD;  Location: [facility] ENDOSCOPY;  Service: Endoscopy;  Laterality: N/A;  . ESOPHAGOGASTRODUODENOSCOPY (EGD) WITH PROPOFOL N/A 01/27/2017   Procedure: ESOPHAGOGASTRODUODENOSCOPY (EGD) WITH PROPOFOL;  Surgeon: Lucilla Lame, MD;  Location: [facility] ENDOSCOPY;  Service: Endoscopy;  Laterality: N/A;  . FEMORAL ARTERY STENT  10/2010   right sided (Dr. Burt Knack)  . TUBAL LIGATION      Current Outpatient Medications  Medication Sig Dispense Refill  . acetaminophen (TYLENOL) 500 MG tablet Take 1,000 mg by mouth every 6 (six) hours as needed for mild pain or headache.    . albuterol (PROVENTIL HFA;VENTOLIN HFA) 108 (90 Base) MCG/ACT inhaler Inhale 2 puffs into the lungs every 6 (six) hours as needed for wheezing or shortness of breath. 1 Inhaler 3  . aspirin 81 MG tablet Take 1 tablet (81 mg total) by mouth daily. (Patient taking differently: Take 81 mg by mouth 2 (two) times daily. ) 90 tablet 3  . Azelastine HCl 0.15 % SOLN Place 2 sprays into both nostrils daily as needed (allergies).     . diazepam (VALIUM) 5 MG tablet Take 1 tablet (5 mg total) by mouth every 8 (eight) hours as needed for muscle spasms. 20 tablet 0  . esomeprazole (NEXIUM) 40 MG capsule Take 40 mg by mouth 2  (two) times daily before a meal. Reported on 09/04/2015    . ezetimibe (ZETIA) 10 MG tablet TAKE ONE TABLET BY MOUTH ONCE DAILY 90 tablet 3  . fluticasone (FLONASE) 50 MCG/ACT nasal spray Place 2 sprays into both nostrils daily as needed for allergies.     . furosemide (LASIX) 20 MG tablet TAKE ONE TABLET BY MOUTH ONCE DAILY FOR SWELLING. MAY TAKE ONE ADDITIONAL TABLET AS NEEDED FOR SWELLING OR FLUID (Patient taking differently: TAKE ONE TABLET BY MOUTH ONCE DAILY.) 180 tablet 3  . ibuprofen (ADVIL,MOTRIN) 800 MG tablet Take 1 tablet (800 mg total) by mouth every 8 (eight) hours as needed. 30 tablet 0  . isosorbide mononitrate (IMDUR) 30 MG 24 hr tablet TAKE ONE TABLET BY MOUTH TWICE DAILY (Patient taking differently: TAKE ONE TABLET BY MOUTH ONCE DAILY) 180 tablet 3  . KLOR-CON M10 10 MEQ tablet TAKE ONE TABLET BY MOUTH ONCE DAILY 90 tablet 3  . meclizine (ANTIVERT) 25 MG tablet Take 1 tablet (25 mg total) by mouth 2 (two) times daily. (Patient taking differently: Take 25 mg by mouth 3 (three) times daily. ) 30 tablet   . metFORMIN (GLUCOPHAGE) 500  MG tablet Take 500 mg by mouth 2 (two) times daily with a meal.     . metoprolol tartrate (LOPRESSOR) 25 MG tablet TAKE ONE TABLET BY MOUTH TWICE DAILY 180 tablet 3  . nitroGLYCERIN (NITROSTAT) 0.4 MG SL tablet Place 1 tablet (0.4 mg total) under the tongue every 5 (five) minutes as needed. 25 tablet 6  . PARoxetine (PAXIL) 10 MG tablet Take 10 mg by mouth daily.    . rosuvastatin (CRESTOR) 20 MG tablet Take 20 mg by mouth daily.     [name] umeclidinium-vilanterol (ANORO ELLIPTA) 62.5-25 MCG/INH AEPB Inhale 1 puff into the lungs daily. 60 each 10  . vitamin B-12 (CYANOCOBALAMIN) 500 MCG tablet Take 500 mcg by mouth daily.    [name] lisinopril (PRINIVIL,ZESTRIL) 5 MG tablet Take 1 tablet (5 mg total) by mouth daily. 90 tablet 3   No current facility-administered medications for this visit.      Allergies:   Shellfish allergy; Sulfonamide derivatives; Watermelon  [citrullus vulgaris]; and Latex   Social History:  The patient  reports that she quit smoking about 6 years ago. She has a 38.00 pack-year smoking history. she has never used smokeless tobacco. She reports that she does not drink alcohol or use drugs.   Family History:   family history includes Brain cancer in her sister; Breast cancer (age of onset: 77) in her mother; Colon cancer (age of onset: 61) in her father; Diabetes in her father; Glaucoma in her father; Heart failure in her father; Hypertension in her father.    Review of Systems: Review of Systems  Constitutional: Negative.   Respiratory: Negative.   Cardiovascular:       Muscles in the chest are tender to palpation  Gastrointestinal: Negative.   Musculoskeletal: Positive for back pain.  Neurological: Negative.   Psychiatric/Behavioral: Negative.   All other systems reviewed and are negative.    PHYSICAL EXAM: VS:  BP 116/70 (BP Location: Left Arm, Patient Position: Sitting, Cuff Size: Large)   Pulse (!) 56   Ht 5\' 5"  (1.651 m)   Wt 219 lb 4 oz (99.5 kg)   BMI 36.49 kg/m  , BMI Body mass index is 36.49 kg/m. GEN: Well nourished, well developed, in no acute distress  HEENT: normal  Neck: no JVD, carotid bruits, or masses Cardiac: RRR; no murmurs, rubs, or gallops,no edema  Respiratory:  clear to auscultation bilaterally, normal work of breathing GI: soft, nontender, nondistended, + BS MS: no deformity or atrophy  Skin: warm and dry, no rash Neuro:  Strength and sensation are intact Psych: euthymic mood, full affect    Recent Labs: 06/16/2017: BUN 11; Creatinine, Ser 0.92; Hemoglobin 13.5; Platelets 184; Potassium 3.2; Sodium 136    Lipid Panel Lab Results  Component Value Date   CHOL 235 (H) 07/11/2011   HDL 58 07/11/2011   LDLCALC 149 (H) 07/11/2011   TRIG 140 07/11/2011      Wt Readings from Last 3 Encounters:  07/10/17 219 lb 4 oz (99.5 kg)  06/16/17 211 lb (95.7 kg)  05/15/17 214 lb (97.1 kg)        ASSESSMENT AND PLAN:   CAD/ CABG:  Denies any symptoms concerning for angina, No further testing Stable  Secondary hypertension, unspecified -  We will add lisinopril 5 mg daily continue other medications  Chest pain, unspecified chest pain type -  Currently with no symptoms of angina.  Continue current medication regimen.  OSA on CPAP  Previously seen by pulmonary Tested positive per  the patient Management per primary care  COPD   stopped smoking several years ago Stable  Chronic pain Chest, legs, back, arms Managed by chiropractic, recommended she consider massage Did not help by holding her Crestor  Leg swelling Takes Lasix every other day, extra Lasix for shortness of breath    Total encounter time more than 25 minutes  Greater than 50% was spent in counseling and coordination of care with the patient   Disposition:   F/U  6 months    Orders Placed This Encounter  Procedures  . EKG 12-Lead     Signed, Esmond Plants, M.D., Ph.D. 07/10/2017  [place], [place]

## 2017-07-10 ENCOUNTER — Encounter: Payer: Self-pay | Admitting: Cardiovascular Disease

## 2017-07-10 ENCOUNTER — Ambulatory Visit (INDEPENDENT_AMBULATORY_CARE_PROVIDER_SITE_OTHER): Payer: Medicare HMO | Admitting: Cardiovascular Disease

## 2017-07-10 VITALS — BP 116/70 | HR 56 | Ht 65.0 in | Wt 219.2 lb

## 2017-07-10 DIAGNOSIS — Z951 Presence of aortocoronary bypass graft: Secondary | ICD-10-CM

## 2017-07-10 DIAGNOSIS — Z87891 Personal history of nicotine dependence: Secondary | ICD-10-CM | POA: Diagnosis not present

## 2017-07-10 DIAGNOSIS — I25118 Atherosclerotic heart disease of native coronary artery with other forms of angina pectoris: Secondary | ICD-10-CM

## 2017-07-10 DIAGNOSIS — I951 Orthostatic hypotension: Secondary | ICD-10-CM | POA: Diagnosis not present

## 2017-07-10 DIAGNOSIS — E782 Mixed hyperlipidemia: Secondary | ICD-10-CM

## 2017-07-10 DIAGNOSIS — I739 Peripheral vascular disease, unspecified: Secondary | ICD-10-CM | POA: Diagnosis not present

## 2017-07-10 DIAGNOSIS — I5022 Chronic systolic (congestive) heart failure: Secondary | ICD-10-CM

## 2017-07-10 MED ORDER — NITROGLYCERIN 0.4 MG SL SUBL: 0.4000 mg | SUBLINGUAL_TABLET | SUBLINGUAL | 6 refills | Status: DC | PRN

## 2017-07-10 MED ORDER — LISINOPRIL 5 MG PO TABS: 5.0000 mg | ORAL_TABLET | Freq: Every day | ORAL | 3 refills | Status: DC

## 2017-07-10 NOTE — Patient Instructions (Addendum)
Medication Instructions:   Take extra lasix for shortness of breath  Start lisinopril 5 mg daily If blood pressure runs really low, Cut the pill in 1/2   Labwork:  No new labs needed  Testing/Procedures:  No further testing at this time   Follow-Up: It was a pleasure seeing you in the office today. Please call us if you have new issues that need to be addressed before your next appt.  [phone]  Your physician wants you to follow-up in: 6 months.  You will receive a reminder letter in the mail two months in advance. If you don't receive a letter, please call our office to schedule the follow-up appointment.  If you need a refill on your cardiac medications before your next appointment, please call your pharmacy.

## 2017-07-15 ENCOUNTER — Ambulatory Visit
Admission: RE | Admit: 2017-07-15 | Discharge: 2017-07-15 | Disposition: A | Discharge status: Home / Self Care | Payer: Medicare HMO | Source: Ambulatory Visit | Attending: Neurosurgery | Admitting: Neurosurgery

## 2017-07-15 DIAGNOSIS — D352 Benign neoplasm of pituitary gland: Secondary | ICD-10-CM | POA: Insufficient documentation

## 2017-07-15 DIAGNOSIS — E236 Other disorders of pituitary gland: Secondary | ICD-10-CM

## 2017-07-15 DIAGNOSIS — E237 Disorder of pituitary gland, unspecified: Secondary | ICD-10-CM | POA: Diagnosis present

## 2017-07-15 MED ORDER — GADOBENATE DIMEGLUMINE 529 MG/ML IV SOLN
10.0000 mL | Freq: Once | INTRAVENOUS | Status: AC | PRN
  Administered 2017-07-15: 10 mL via INTRAVENOUS

## 2017-07-17 DIAGNOSIS — E782 Mixed hyperlipidemia: Secondary | ICD-10-CM | POA: Diagnosis not present

## 2017-07-17 DIAGNOSIS — I10 Essential (primary) hypertension: Secondary | ICD-10-CM | POA: Diagnosis not present

## 2017-07-17 DIAGNOSIS — I2510 Atherosclerotic heart disease of native coronary artery without angina pectoris: Secondary | ICD-10-CM | POA: Diagnosis not present

## 2017-07-17 DIAGNOSIS — E119 Type 2 diabetes mellitus without complications: Secondary | ICD-10-CM | POA: Diagnosis not present

## 2017-07-28 DIAGNOSIS — D332 Benign neoplasm of brain, unspecified: Secondary | ICD-10-CM

## 2017-07-28 DIAGNOSIS — G4733 Obstructive sleep apnea (adult) (pediatric): Secondary | ICD-10-CM | POA: Diagnosis not present

## 2017-07-28 DIAGNOSIS — I259 Chronic ischemic heart disease, unspecified: Secondary | ICD-10-CM | POA: Diagnosis not present

## 2017-07-28 HISTORY — DX: Benign neoplasm of brain, unspecified: D33.2

## 2017-07-29 DIAGNOSIS — S83281D Other tear of lateral meniscus, current injury, right knee, subsequent encounter: Secondary | ICD-10-CM | POA: Diagnosis not present

## 2017-07-29 DIAGNOSIS — S83241D Other tear of medial meniscus, current injury, right knee, subsequent encounter: Secondary | ICD-10-CM | POA: Diagnosis not present

## 2017-08-10 ENCOUNTER — Other Ambulatory Visit: Payer: Self-pay | Admitting: Internal Medicine

## 2017-08-10 DIAGNOSIS — Z1231 Encounter for screening mammogram for malignant neoplasm of breast: Secondary | ICD-10-CM

## 2017-08-13 ENCOUNTER — Encounter
Admission: RE | Admit: 2017-08-13 | Discharge: 2017-08-13 | Disposition: A | Discharge status: Home / Self Care | Payer: Medicare HMO | Source: Ambulatory Visit | Attending: Orthopedic Surgery | Admitting: Orthopedic Surgery

## 2017-08-13 ENCOUNTER — Other Ambulatory Visit: Payer: Self-pay

## 2017-08-13 ENCOUNTER — Encounter: Payer: Self-pay | Admitting: *Deleted

## 2017-08-13 DIAGNOSIS — X58XXXA Exposure to other specified factors, initial encounter: Secondary | ICD-10-CM | POA: Insufficient documentation

## 2017-08-13 DIAGNOSIS — S83241A Other tear of medial meniscus, current injury, right knee, initial encounter: Secondary | ICD-10-CM | POA: Insufficient documentation

## 2017-08-13 DIAGNOSIS — Z882 Allergy status to sulfonamides status: Secondary | ICD-10-CM | POA: Insufficient documentation

## 2017-08-13 DIAGNOSIS — Z01812 Encounter for preprocedural laboratory examination: Secondary | ICD-10-CM | POA: Diagnosis not present

## 2017-08-13 DIAGNOSIS — Z91013 Allergy to seafood: Secondary | ICD-10-CM | POA: Diagnosis not present

## 2017-08-13 DIAGNOSIS — Z91040 Latex allergy status: Secondary | ICD-10-CM | POA: Insufficient documentation

## 2017-08-13 HISTORY — DX: Inflammatory liver disease, unspecified: K75.9

## 2017-08-13 HISTORY — DX: Benign neoplasm of brain, unspecified: D33.2

## 2017-08-13 LAB — BASIC METABOLIC PANEL
ANION GAP: 10 (ref 5–15)
BUN: 15 mg/dL (ref 6–20)
CO2: 27 mmol/L (ref 22–32)
CREATININE: 0.99 mg/dL (ref 0.44–1.00)
Calcium: 9.6 mg/dL (ref 8.9–10.3)
Chloride: 101 mmol/L (ref 101–111)
GFR calc Af Amer: >60 mL/min (ref >60)
GFR calc non Af Amer: >60 mL/min (ref >60)
GLUCOSE: 99 mg/dL (ref 65–99)
Potassium: 3.3 mmol/L — ABNORMAL LOW (ref 3.5–5.1)
Sodium: 138 mmol/L (ref 135–145)

## 2017-08-13 LAB — CBC
HEMATOCRIT: 40.2 % (ref 35.0–47.0)
Hemoglobin: 13.2 g/dL (ref 12.0–16.0)
MCH: 30.9 pg (ref 26.0–34.0)
MCHC: 32.9 g/dL (ref 32.0–36.0)
MCV: 94.1 fL (ref 80.0–100.0)
Platelets: 184 10*3/uL (ref 150–440)
RBC: 4.27 MIL/uL (ref 3.80–5.20)
RDW: 14.3 % (ref 11.5–14.5)
WBC: 5.1 10*3/uL (ref 3.6–11.0)

## 2017-08-13 NOTE — Patient Instructions (Signed)
Your procedure is scheduled on: Thursday, August 20, 2017  Report to [facility]  To find out your arrival time please call [phone] between 1PM - 3PM on Wednesday, August 19, 2017  Remember: Instructions that are not followed completely may result in serious medical risk, up to and including death, or upon the discretion of your surgeon and anesthesiologist your surgery may need to be rescheduled.     _X__ 1. Do not eat food after midnight the night before your procedure.                 No gum chewing or hard candies. You may drink clear liquids up to 2 hours                 before you are scheduled to arrive for your surgery-                   DO not drink clear liquids within 2 hours of the start of your surgery.                Clear Liquids include:  Water.[name] BEING DIABETIC, PLEASE ONLY DRINK WATER THAT DAY      _X__ 2.  No Alcohol for 24 hours before or after surgery.   _X__ 3.  Do Not Smoke or use e-cigarettes For 24 Hours Prior to Your Surgery.                 Do not use any chewable tobacco products for at least 6 hours prior to                 surgery.  ____  4.  Bring all medications with you on the day of surgery if instructed.   __X__  5.  Notify your doctor if there is any change in your medical condition      (cold, fever, infections).     Do not wear jewelry, make-up, hairpins, clips or nail polish. Do not wear lotions, powders, or perfumes. You may wear deodorant. Do not shave 48 hours prior to surgery. Men may shave face and neck. Do not bring valuables to the hospital.    [facility] is not responsible for any belongings or valuables.  Contacts, dentures or bridgework may not be worn into surgery. Leave your suitcase in the car. After surgery it may be brought to your room. For patients admitted to the hospital, discharge time is determined by your treatment team.   Patients discharged the day of surgery will not  be allowed to drive home.   Please read over the following fact sheets that you were given:   [place]   __X__ Take these medicines the morning of surgery with A SIP OF WATER:    1. [place]  2. METOPROLOL  3. IMDUR  4. MECLIZINE  5. PAXIL  6. NASAL SPRAY AND INHALER             7. IF  YOU ARE RESTARTED ON CRESTOR, YOU MAY TAKE THIS ON THE MORNING OF SURGERY  ____ Fleet Enema (as directed)   __X__ Use CHG Soap as directed  __X__ Use inhalers on the day of surgery  ____ Stop metformin 2 days prior to surgery    __X__ Stop ASPIRIN AS OF TODAY, January 17,2019  __X__ Stop Anti-inflammatories AS OF  TODAY, August 13, 2017.  THIS INCLUDES IBUPROFEN / MOTRIN / ADVIL / ALEVE / GOODIES POWDERS   __X_ Stop supplements until after surgery.  YOU CAN CONTINUE THE VITAMIN D BUT DO NOT TAKE ON DAY OF SURGERY  __X__ Bring C-Pap to the hospital.    CONTINUE TO TAKE THE LASIX AND KLOR AS PRESCRIBED.  DO NOT TAKE ON DAY OF SURGERY.  CONTINUE TO TAKE LISINOPRIL BUT NOT ON THE DAY OF SURGERY.  CONTINUE TO TAKE ZETIA BUT NOT ON THE DAY OF SURGERY.  TRY TO GET YOUR HANDS ON A WALKER OR CRUTCHES FOR THE DAY OF SURGERY.  IF YOU COMPLETE THE ADVANCE DIRECTIVES, BRING WITH YOU ON SURGERY DAY.

## 2017-08-20 ENCOUNTER — Ambulatory Visit: Payer: Medicare HMO | Admitting: Anesthesiology

## 2017-08-20 ENCOUNTER — Ambulatory Visit
Admission: RE | Admit: 2017-08-20 | Discharge: 2017-08-20 | Disposition: A | Discharge status: Home / Self Care | Payer: Medicare HMO | Source: Ambulatory Visit | Attending: Orthopedic Surgery | Admitting: Orthopedic Surgery

## 2017-08-20 ENCOUNTER — Encounter
Admission: RE | Disposition: A | Discharge status: Home / Self Care | Payer: Self-pay | Source: Ambulatory Visit | Attending: Orthopedic Surgery

## 2017-08-20 ENCOUNTER — Other Ambulatory Visit: Payer: Self-pay

## 2017-08-20 ENCOUNTER — Encounter: Payer: Self-pay | Admitting: *Deleted

## 2017-08-20 ENCOUNTER — Other Ambulatory Visit: Payer: Self-pay | Admitting: Cardiovascular Disease

## 2017-08-20 DIAGNOSIS — S83241A Other tear of medial meniscus, current injury, right knee, initial encounter: Secondary | ICD-10-CM | POA: Insufficient documentation

## 2017-08-20 DIAGNOSIS — J449 Chronic obstructive pulmonary disease, unspecified: Secondary | ICD-10-CM | POA: Diagnosis not present

## 2017-08-20 DIAGNOSIS — D649 Anemia, unspecified: Secondary | ICD-10-CM | POA: Diagnosis not present

## 2017-08-20 DIAGNOSIS — E785 Hyperlipidemia, unspecified: Secondary | ICD-10-CM | POA: Insufficient documentation

## 2017-08-20 DIAGNOSIS — S83289A Other tear of lateral meniscus, current injury, unspecified knee, initial encounter: Secondary | ICD-10-CM | POA: Diagnosis not present

## 2017-08-20 DIAGNOSIS — E119 Type 2 diabetes mellitus without complications: Secondary | ICD-10-CM | POA: Insufficient documentation

## 2017-08-20 DIAGNOSIS — I110 Hypertensive heart disease with heart failure: Secondary | ICD-10-CM | POA: Diagnosis not present

## 2017-08-20 DIAGNOSIS — F329 Major depressive disorder, single episode, unspecified: Secondary | ICD-10-CM | POA: Diagnosis not present

## 2017-08-20 DIAGNOSIS — G4730 Sleep apnea, unspecified: Secondary | ICD-10-CM | POA: Diagnosis not present

## 2017-08-20 DIAGNOSIS — K219 Gastro-esophageal reflux disease without esophagitis: Secondary | ICD-10-CM | POA: Diagnosis not present

## 2017-08-20 DIAGNOSIS — M1990 Unspecified osteoarthritis, unspecified site: Secondary | ICD-10-CM | POA: Diagnosis not present

## 2017-08-20 DIAGNOSIS — I2510 Atherosclerotic heart disease of native coronary artery without angina pectoris: Secondary | ICD-10-CM | POA: Diagnosis not present

## 2017-08-20 DIAGNOSIS — R51 Headache: Secondary | ICD-10-CM | POA: Diagnosis not present

## 2017-08-20 DIAGNOSIS — I252 Old myocardial infarction: Secondary | ICD-10-CM | POA: Insufficient documentation

## 2017-08-20 DIAGNOSIS — I509 Heart failure, unspecified: Secondary | ICD-10-CM | POA: Insufficient documentation

## 2017-08-20 DIAGNOSIS — Z79899 Other long term (current) drug therapy: Secondary | ICD-10-CM | POA: Diagnosis not present

## 2017-08-20 DIAGNOSIS — Z87891 Personal history of nicotine dependence: Secondary | ICD-10-CM | POA: Diagnosis not present

## 2017-08-20 DIAGNOSIS — R69 Illness, unspecified: Secondary | ICD-10-CM | POA: Diagnosis not present

## 2017-08-20 HISTORY — PX: KNEE ARTHROSCOPY WITH MEDIAL MENISECTOMY: SHX5651

## 2017-08-20 LAB — GLUCOSE, CAPILLARY
GLUCOSE-CAPILLARY: 110 mg/dL — AB (ref 65–99)
Glucose-Capillary: 104 mg/dL — ABNORMAL HIGH (ref 65–99)

## 2017-08-20 LAB — POCT I-STAT 4, (NA,K, GLUC, HGB,HCT)
GLUCOSE: 107 mg/dL — AB (ref 65–99)
HEMATOCRIT: 42 % (ref 36.0–46.0)
HEMOGLOBIN: 14.3 g/dL (ref 12.0–15.0)
Potassium: 3.5 mmol/L (ref 3.5–5.1)
Sodium: 145 mmol/L (ref 135–145)

## 2017-08-20 LAB — URINE DRUG SCREEN, QUALITATIVE (ARMC ONLY)
Amphetamines, Ur Screen: NOT DETECTED
BENZODIAZEPINE, UR SCRN: POSITIVE — AB
Barbiturates, Ur Screen: NOT DETECTED
COCAINE METABOLITE, UR ~~LOC~~: NOT DETECTED
Cannabinoid 50 Ng, Ur ~~LOC~~: POSITIVE — AB
MDMA (Ecstasy)Ur Screen: NOT DETECTED
Methadone Scn, Ur: NOT DETECTED
OPIATE, UR SCREEN: NOT DETECTED
PHENCYCLIDINE (PCP) UR S: NOT DETECTED
Tricyclic, Ur Screen: POSITIVE — AB

## 2017-08-20 SURGERY — ARTHROSCOPY, KNEE, WITH MEDIAL MENISCECTOMY
Anesthesia: General | Site: Knee | Laterality: Right | Wound class: Clean

## 2017-08-20 MED ORDER — FENTANYL CITRATE (PF) 100 MCG/2ML IJ SOLN
25.0000 ug | INTRAMUSCULAR | Status: DC | PRN
  Administered 2017-08-20 (×3): 50 ug via INTRAVENOUS

## 2017-08-20 MED ORDER — FENTANYL CITRATE (PF) 100 MCG/2ML IJ SOLN
INTRAMUSCULAR | Status: AC
  Administered 2017-08-20: 50 ug via INTRAVENOUS
  Filled 2017-08-20: qty 2

## 2017-08-20 MED ORDER — HYDROCODONE-ACETAMINOPHEN 5-325 MG PO TABS: 1.0000 | ORAL_TABLET | Freq: Four times a day (QID) | ORAL | 0 refills | Status: DC | PRN

## 2017-08-20 MED ORDER — ACETAMINOPHEN 10 MG/ML IV SOLN
INTRAVENOUS | Status: DC | PRN
  Administered 2017-08-20: 1000 mg via INTRAVENOUS

## 2017-08-20 MED ORDER — ACETAMINOPHEN 10 MG/ML IV SOLN
INTRAVENOUS | Status: AC
  Filled 2017-08-20: qty 100

## 2017-08-20 MED ORDER — MIDAZOLAM HCL 2 MG/2ML IJ SOLN
INTRAMUSCULAR | Status: AC
  Filled 2017-08-20: qty 2

## 2017-08-20 MED ORDER — FENTANYL CITRATE (PF) 100 MCG/2ML IJ SOLN
INTRAMUSCULAR | Status: DC | PRN
  Administered 2017-08-20 (×2): 50 ug via INTRAVENOUS

## 2017-08-20 MED ORDER — PROPOFOL 10 MG/ML IV BOLUS
INTRAVENOUS | Status: AC
  Filled 2017-08-20: qty 20

## 2017-08-20 MED ORDER — SODIUM CHLORIDE 0.9 % IV SOLN
INTRAVENOUS | Status: DC
  Administered 2017-08-20: 09:00:00 via INTRAVENOUS

## 2017-08-20 MED ORDER — ONDANSETRON HCL 4 MG/2ML IJ SOLN
INTRAMUSCULAR | Status: DC | PRN
  Administered 2017-08-20: 4 mg via INTRAVENOUS

## 2017-08-20 MED ORDER — OXYCODONE HCL 5 MG PO TABS: 5.0000 mg | ORAL_TABLET | Freq: Once | ORAL | Status: DC | PRN

## 2017-08-20 MED ORDER — FENTANYL CITRATE (PF) 100 MCG/2ML IJ SOLN
INTRAMUSCULAR | Status: AC
  Filled 2017-08-20: qty 2

## 2017-08-20 MED ORDER — DEXAMETHASONE SODIUM PHOSPHATE 10 MG/ML IJ SOLN
INTRAMUSCULAR | Status: DC | PRN
  Administered 2017-08-20: 10 mg via INTRAVENOUS

## 2017-08-20 MED ORDER — MIDAZOLAM HCL 2 MG/2ML IJ SOLN
INTRAMUSCULAR | Status: DC | PRN
  Administered 2017-08-20: 2 mg via INTRAVENOUS

## 2017-08-20 MED ORDER — LIDOCAINE HCL (CARDIAC) 20 MG/ML IV SOLN
INTRAVENOUS | Status: DC | PRN
  Administered 2017-08-20: 100 mg via INTRAVENOUS

## 2017-08-20 MED ORDER — KETOROLAC TROMETHAMINE 30 MG/ML IJ SOLN
INTRAMUSCULAR | Status: DC | PRN
  Administered 2017-08-20: 30 mg via INTRAVENOUS

## 2017-08-20 MED ORDER — OXYCODONE HCL 5 MG/5ML PO SOLN: 5.0000 mg | Freq: Once | ORAL | Status: DC | PRN

## 2017-08-20 MED ORDER — PROMETHAZINE HCL 25 MG/ML IJ SOLN: 6.2500 mg | INTRAMUSCULAR | Status: DC | PRN

## 2017-08-20 MED ORDER — MEPERIDINE HCL 50 MG/ML IJ SOLN: 6.2500 mg | INTRAMUSCULAR | Status: DC | PRN

## 2017-08-20 MED ORDER — BUPIVACAINE-EPINEPHRINE (PF) 0.5% -1:200000 IJ SOLN
INTRAMUSCULAR | Status: DC | PRN
  Administered 2017-08-20: 20 mL

## 2017-08-20 MED ORDER — BUPIVACAINE-EPINEPHRINE (PF) 0.5% -1:200000 IJ SOLN
INTRAMUSCULAR | Status: AC
  Filled 2017-08-20: qty 30

## 2017-08-20 MED ORDER — PROPOFOL 10 MG/ML IV BOLUS
INTRAVENOUS | Status: DC | PRN
  Administered 2017-08-20: 200 mg via INTRAVENOUS

## 2017-08-20 SURGICAL SUPPLY — 24 items
BANDAGE ACE 4X5 VEL STRL LF (GAUZE/BANDAGES/DRESSINGS) IMPLANT
BLADE INCISOR PLUS 4.5 (BLADE) IMPLANT
CHLORAPREP W/TINT 26ML (MISCELLANEOUS) ×2 IMPLANT
CUFF TOURN 24 STER (MISCELLANEOUS) IMPLANT
CUFF TOURN 30 STER DUAL PORT (MISCELLANEOUS) IMPLANT
GAUZE SPONGE 4X4 12PLY STRL (GAUZE/BANDAGES/DRESSINGS) ×2 IMPLANT
GLOVE SURG SYN 9.0  PF PI (GLOVE) ×1
GLOVE SURG SYN 9.0 PF PI (GLOVE) ×1 IMPLANT
GOWN SRG 2XL LVL 4 RGLN SLV (GOWNS) ×1 IMPLANT
GOWN STRL NON-REIN 2XL LVL4 (GOWNS) ×1
GOWN STRL REUS W/ TWL LRG LVL3 (GOWN DISPOSABLE) ×2 IMPLANT
GOWN STRL REUS W/TWL LRG LVL3 (GOWN DISPOSABLE) ×2
IV LACTATED RINGER IRRG 3000ML (IV SOLUTION) ×2
IV LR IRRIG 3000ML ARTHROMATIC (IV SOLUTION) ×2 IMPLANT
KIT RM TURNOVER STRD PROC AR (KITS) ×2 IMPLANT
MANIFOLD NEPTUNE II (INSTRUMENTS) ×2 IMPLANT
PACK ARTHROSCOPY KNEE (MISCELLANEOUS) ×2 IMPLANT
SET TUBE SUCT SHAVER OUTFL 24K (TUBING) ×2 IMPLANT
SET TUBE TIP INTRA-ARTICULAR (MISCELLANEOUS) ×2 IMPLANT
SUT ETHILON 4-0 (SUTURE) ×1
SUT ETHILON 4-0 FS2 18XMFL BLK (SUTURE) ×1
SUTURE ETHLN 4-0 FS2 18XMF BLK (SUTURE) ×1 IMPLANT
TUBING ARTHRO INFLOW-ONLY STRL (TUBING) ×2 IMPLANT
WAND COBLATION FLOW 50 (SURGICAL WAND) ×2 IMPLANT

## 2017-08-20 NOTE — H&P (Signed)
Reviewed paper H+P, will be scanned into chart. No changes noted.  

## 2017-08-20 NOTE — Op Note (Signed)
08/20/2017  10:46 AM  PATIENT:  [name]  57 y.o. female  PRE-OPERATIVE DIAGNOSIS:  MEDIAL MENISCUS TEAR OF RIGHT KNEE and lateral meniscus tear  POST-OPERATIVE DIAGNOSIS:  MEDIAL MENISCUS TEAR OF RIGHT KNEE and lateral meniscus tear  PROCEDURE:  Procedure(s): KNEE ARTHROSCOPY WITH MEDIAL  AND LATERAL MENISECTOMY (Right)  SURGEON: Laurene Footman, MD  ASSISTANTS: None  ANESTHESIA:   general  EBL:  Total I/O In: -  Out: 2 [Blood:2]  BLOOD ADMINISTERED:none  DRAINS: none   LOCAL MEDICATIONS USED:  MARCAINE     SPECIMEN:  No Specimen  DISPOSITION OF SPECIMEN:  N/A  COUNTS:  YES  TOURNIQUET:  * Missing tourniquet times found for documented tourniquets in log: [id number] *none  IMPLANTS: None  DICTATION: .Dragon Dictation patient was brought the operating room and after adequate general anesthesia was obtained the right leg was prepped and draped in usual sterile fashion with a tourniquet applied to the upper thigh along with the arthroscopic leg holder without the tourniquet being utilized during the procedure. After patient identification and timeout procedure completed an inferior lateral portal was made and the arthroscope introduced. Initial inspection revealed mild synovitis in suprapatellar pouch and along the medial side with minimal patellofemoral chondromalacia. Coming around medially and inferior medial portal was made on probing there is a complex tear involving the posterior third of the meniscus involving the inner two thirds this had vertical as well as horizontal components and could be displaced into the joint. The articular cartilage was essentially normal the medial compartment this was subsequently debrided with meniscal punch and ArthriCare wand to a stable margin. In the notch the anterior cruciate ligament was intact and in the lateral compartment there is extensive synovitis anteriorly with of complex tear involving the anterior and middle thirds involving  most of the anterior and at least half the lateral rather middle third of the lateral meniscus posterior meniscus was intact with extensive degenerative changes with partial thickness loss over the entire tibia and femoral condyle no normal-appearing cartilage no exposed bone and no fissuring down to the bone but extensive degenerative change throughout the lateral compartment. ArthroCare wand was used to get the meniscus back to a stable margin on the side of bleeding a great deal of the anterior horn tear and middle third again getting back to a stable margin with pre-and post procedure pictures obtained. Partial synovectomy was also performed removing some plica band that appeared to impinge at the patellofemoral joint the knee was irrigated until clear and all argentation withdrawn. The incisions were infiltrated with 20 cc half percent Sensorcaine with epinephrine and the wounds dressed with Xeroform 4 x 4 web roll and Ace wrap  PLAN OF CARE: Discharge to home after PACU  PATIENT DISPOSITION:  PACU - hemodynamically stable.

## 2017-08-20 NOTE — Discharge Instructions (Addendum)
Try to keep dressing clean and dry. If bandage slides down the leg remove entire bandage and covered to incisions with Band-Aids, reapply Ace wrap only. Pain medicine as directed. Resume aspirin today  AMBULATORY SURGERY  DISCHARGE INSTRUCTIONS   1) The drugs that you were given will stay in your system until tomorrow so for the next 24 hours you should not:  A) Drive an automobile B) Make any legal decisions C) Drink any alcoholic beverage   2) You may resume regular meals tomorrow.  Today it is better to start with liquids and gradually work up to solid foods.  You may eat anything you prefer, but it is better to start with liquids, then soup and crackers, and gradually work up to solid foods.   3) Please notify your doctor immediately if you have any unusual bleeding, trouble breathing, redness and pain at the surgery site, drainage, fever, or pain not relieved by medication.    4) Additional Instructions:        Please contact your physician with any problems or Same Day Surgery at [phone], Monday through Friday 6 am to 4 pm, or [place] at [facility] number at [phone].

## 2017-08-20 NOTE — Anesthesia Post-op Follow-up Note (Signed)
Anesthesia QCDR form completed.        

## 2017-08-20 NOTE — Anesthesia Preprocedure Evaluation (Signed)
Anesthesia Evaluation  Patient identified by MRN, date of birth, ID band Patient awake    Reviewed: Allergy & Precautions, NPO status , Patient's Chart, lab work & pertinent test results  History of Anesthesia Complications Negative for: history of anesthetic complications  Airway Mallampati: II  TM Distance: >3 FB Neck ROM: Full    Dental  (+) Missing   Pulmonary sleep apnea and Continuous Positive Airway Pressure Ventilation , COPD, former smoker,    breath sounds clear to auscultation- rhonchi (-) wheezing      Cardiovascular hypertension, Pt. on medications + CAD, + Past MI, + Cardiac Stents (all stents prior to CABG), + CABG and +CHF (EF 45%)   Rhythm:Regular Rate:Normal - Systolic murmurs and - Diastolic murmurs Echo 6/60/63: - Left ventricle: The cavity size was normal. Wall thickness was   normal. Systolic function was mildly to moderately reduced. The   estimated ejection fraction was in the range of 40% to 45%.   Doppler parameters are consistent with abnormal left ventricular   relaxation (grade 1 diastolic dysfunction). - Pulmonary arteries: Systolic pressure was within the normal   range.   Neuro/Psych  Headaches, PSYCHIATRIC DISORDERS Depression    GI/Hepatic Neg liver ROS, PUD, GERD  Controlled,  Endo/Other  diabetes (diet controlled)  Renal/GU negative Renal ROS     Musculoskeletal  (+) Arthritis ,   Abdominal (+) - obese,   Peds  Hematology  (+) anemia ,   Anesthesia Other Findings Past Medical History: No date: (HFpEF) heart failure with preserved ejection fraction ([place])     Comment:  a. 06/2016 Echo: EF 50%, no rwma, mild to mod TR. 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 being followed by neurosurgery/eye               doctor  and pcp. monitoring size.causes sinus problems No date: Bronchitis No date: Cardiac arrhythmia No date: Cervical neck pain with evidence of disc disease     Comment:  C5/6 disease, MRI done late 2012 - no records available No date: Chronic pain No date: Cocaine abuse, in remission (HCC)     Comment:  clean x 24 years No date: COPD (chronic obstructive pulmonary disease) (HCC)     Comment:  many inhalers No date: Coronary artery disease     Comment:  a. PCI of LCX 2003; b. PCI of the LAD 2012 with a (2.5 x              8 mm BMS);  c.s/p CABG 4/12:  L-LAD, S-Dx, S-OM, S-RCA               (Dr. Prescott Gum);  d. 01/2012 MV: inf infarct, attenuation,              no ischemia. No date: Depression No date: Diabetes mellitus without complication (HCC) No date: Family history of colon cancer No date: Generalized headaches     Comment:  frequent No date: GERD (gastroesophageal reflux disease) No date: Heart murmur No date: Hematochezia No date: Hepatitis     Comment:  history of hepatitis b No date: History of cervical cancer     Comment:  s/p cryotherapy No date: History of drug abuse     Comment:  cocaine, marijuana, clean since 1989 No date: History of hepatitis B  Comment:  from eating undercooked liver No date: History of kidney stones No date: History of MI (myocardial infarction) No date: Hyperlipidemia No date: Hypertension 2003, 2012: Myocardial infarction ([place]) No date: PAD (peripheral artery disease) ([place])     Comment:  s/p Right SFA atherectomy and PTA 01/15/11, normal ABIs               01/2011 No date: Polyp of colon No date: Seasonal allergies No date: Sleep apnea     Comment:  uses cpap No date: Smoking history     Comment:  quit 07/9164 No date: Systolic CHF, chronic (HCC)     Comment:  mild: echo 08/2010 - mildly reduced EF 40-45%, mild               diffuse hypokinesis No date: Thyroid disease No date: Urine incontinence   Reproductive/Obstetrics                              Anesthesia Physical Anesthesia Plan  ASA: III  Anesthesia Plan: General   Post-op Pain Management:    Induction: Intravenous  PONV Risk Score and Plan: 2  Airway Management Planned: LMA  Additional Equipment:   Intra-op Plan:   Post-operative Plan:   Informed Consent: I have reviewed the patients History and Physical, chart, labs and discussed the procedure including the risks, benefits and alternatives for the proposed anesthesia with the patient or authorized representative who has indicated his/her understanding and acceptance.   Dental advisory given  Plan Discussed with: CRNA and Anesthesiologist  Anesthesia Plan Comments:         Anesthesia Quick Evaluation

## 2017-08-20 NOTE — Transfer of Care (Signed)
Immediate Anesthesia Transfer of Care Note  Patient: [name]  Procedure(s) Performed: KNEE ARTHROSCOPY WITH MEDIAL  AND LATERAL MENISECTOMY (Right Knee)  Patient Location: PACU  Anesthesia Type:General  Level of Consciousness: sedated  Airway & Oxygen Therapy: Patient Spontanous Breathing and Patient connected to face mask oxygen  Post-op Assessment: Report given to RN and Post -op Vital signs reviewed and stable  Post vital signs: Reviewed and stable  Last Vitals:  Vitals:   08/20/17 0815  BP: 122/81  Pulse: 85  Resp: 16  Temp: 36.4 C  SpO2: 100%    Last Pain:  Vitals:   08/20/17 0815  TempSrc: Oral  PainSc: 0-No pain         Complications: No apparent anesthesia complications

## 2017-08-20 NOTE — Anesthesia Procedure Notes (Signed)
Procedure Name: LMA Insertion Date/Time: 08/20/2017 9:45 AM Performed by: Nelda Marseille, CRNA Pre-anesthesia Checklist: Patient identified, Patient being monitored, Timeout performed, Emergency Drugs available and Suction available Patient Re-evaluated:Patient Re-evaluated prior to induction Oxygen Delivery Method: Circle system utilized Preoxygenation: Pre-oxygenation with 100% oxygen Induction Type: IV induction Ventilation: Mask ventilation without difficulty LMA: LMA inserted LMA Size: 3.5 Tube type: Oral Number of attempts: 1 Placement Confirmation: positive ETCO2 and breath sounds checked- equal and bilateral Tube secured with: Tape Dental Injury: Teeth and Oropharynx as per pre-operative assessment

## 2017-08-20 NOTE — Anesthesia Postprocedure Evaluation (Signed)
Anesthesia Post Note  Patient: Zachary George  Procedure(s) Performed: KNEE ARTHROSCOPY WITH MEDIAL  AND LATERAL MENISECTOMY (Right Knee)  Patient location during evaluation: PACU Anesthesia Type: General Level of consciousness: awake and alert and oriented Pain management: pain level controlled Vital Signs Assessment: post-procedure vital signs reviewed and stable Respiratory status: spontaneous breathing, nonlabored ventilation and respiratory function stable Cardiovascular status: blood pressure returned to baseline and stable Postop Assessment: no signs of nausea or vomiting Anesthetic complications: no     Last Vitals:  Vitals:   08/20/17 1108 08/20/17 1111  BP: 136/74 136/74  Pulse: (!) 53 (!) 55  Resp: 10 (!) 21  Temp:    SpO2: 98% 98%    Last Pain:  Vitals:   08/20/17 1106  TempSrc:   PainSc: 5                  Aubryana Vittorio

## 2017-08-28 ENCOUNTER — Other Ambulatory Visit: Payer: Self-pay | Admitting: Cardiovascular Disease

## 2017-09-10 ENCOUNTER — Ambulatory Visit
Admission: RE | Admit: 2017-09-10 | Discharge: 2017-09-10 | Disposition: A | Discharge status: Home / Self Care | Payer: Medicare HMO | Source: Ambulatory Visit | Attending: Internal Medicine | Admitting: Internal Medicine

## 2017-09-10 DIAGNOSIS — R928 Other abnormal and inconclusive findings on diagnostic imaging of breast: Secondary | ICD-10-CM | POA: Diagnosis not present

## 2017-09-10 DIAGNOSIS — Z1231 Encounter for screening mammogram for malignant neoplasm of breast: Secondary | ICD-10-CM | POA: Diagnosis not present

## 2017-09-14 ENCOUNTER — Other Ambulatory Visit: Payer: Self-pay | Admitting: Internal Medicine

## 2017-09-14 DIAGNOSIS — R928 Other abnormal and inconclusive findings on diagnostic imaging of breast: Secondary | ICD-10-CM

## 2017-09-15 DIAGNOSIS — M25561 Pain in right knee: Secondary | ICD-10-CM | POA: Diagnosis not present

## 2017-09-23 ENCOUNTER — Emergency Department
Admission: EM | Admit: 2017-09-23 | Discharge: 2017-09-23 | Disposition: A | Discharge status: Home / Self Care | Payer: Medicare HMO | Attending: Emergency Medicine | Admitting: Emergency Medicine

## 2017-09-23 ENCOUNTER — Ambulatory Visit: Payer: Medicare HMO | Admitting: Pulmonary Disease

## 2017-09-23 ENCOUNTER — Encounter: Payer: Self-pay | Admitting: Emergency Medicine

## 2017-09-23 DIAGNOSIS — Z8541 Personal history of malignant neoplasm of cervix uteri: Secondary | ICD-10-CM | POA: Insufficient documentation

## 2017-09-23 DIAGNOSIS — Z951 Presence of aortocoronary bypass graft: Secondary | ICD-10-CM | POA: Diagnosis not present

## 2017-09-23 DIAGNOSIS — Z87891 Personal history of nicotine dependence: Secondary | ICD-10-CM | POA: Insufficient documentation

## 2017-09-23 DIAGNOSIS — I5022 Chronic systolic (congestive) heart failure: Secondary | ICD-10-CM | POA: Diagnosis not present

## 2017-09-23 DIAGNOSIS — Z91040 Latex allergy status: Secondary | ICD-10-CM | POA: Insufficient documentation

## 2017-09-23 DIAGNOSIS — Z7982 Long term (current) use of aspirin: Secondary | ICD-10-CM | POA: Diagnosis not present

## 2017-09-23 DIAGNOSIS — Z79899 Other long term (current) drug therapy: Secondary | ICD-10-CM | POA: Insufficient documentation

## 2017-09-23 DIAGNOSIS — I252 Old myocardial infarction: Secondary | ICD-10-CM | POA: Diagnosis not present

## 2017-09-23 DIAGNOSIS — I110 Hypertensive heart disease with heart failure: Secondary | ICD-10-CM | POA: Insufficient documentation

## 2017-09-23 DIAGNOSIS — M545 Low back pain: Secondary | ICD-10-CM | POA: Diagnosis present

## 2017-09-23 DIAGNOSIS — J449 Chronic obstructive pulmonary disease, unspecified: Secondary | ICD-10-CM | POA: Diagnosis not present

## 2017-09-23 DIAGNOSIS — I2510 Atherosclerotic heart disease of native coronary artery without angina pectoris: Secondary | ICD-10-CM | POA: Diagnosis not present

## 2017-09-23 DIAGNOSIS — E119 Type 2 diabetes mellitus without complications: Secondary | ICD-10-CM | POA: Insufficient documentation

## 2017-09-23 DIAGNOSIS — M5442 Lumbago with sciatica, left side: Secondary | ICD-10-CM | POA: Diagnosis not present

## 2017-09-23 LAB — URINALYSIS, COMPLETE (UACMP) WITH MICROSCOPIC
BACTERIA UA: NONE SEEN
Bilirubin Urine: NEGATIVE
Glucose, UA: NEGATIVE mg/dL
HGB URINE DIPSTICK: NEGATIVE
Ketones, ur: NEGATIVE mg/dL
LEUKOCYTES UA: NEGATIVE
NITRITE: NEGATIVE
Protein, ur: NEGATIVE mg/dL
SPECIFIC GRAVITY, URINE: 1.005 (ref 1.005–1.030)
pH: 5 (ref 5.0–8.0)

## 2017-09-23 LAB — GLUCOSE, CAPILLARY: GLUCOSE-CAPILLARY: 104 mg/dL — AB (ref 65–99)

## 2017-09-23 MED ORDER — PREDNISONE 10 MG PO TABS: ORAL_TABLET | ORAL | 0 refills | Status: DC

## 2017-09-23 NOTE — Discharge Instructions (Signed)
Follow-up with your primary care doctor if any continued problems with your back.  Begin taking prednisone as directed starting with 6 tablets today and tapering down by 1 until you are finished.  You may use ice or heat to your back.  Also today your blood sugar was 103.  Be mindful of what you were eating as prednisone could elevate your blood sugar.

## 2017-09-23 NOTE — ED Triage Notes (Signed)
Patient presents to ED via POV from home with c/o right sided back pain that began yesterday. Denies recent injury or trauma. Patient denies abdominal pain, N/V or dysuria. Patient denies pain going down her leg.

## 2017-09-23 NOTE — ED Notes (Signed)
Pt c/o pain to left side, left buttocks, and LLQ.  Had stomach bug Sunday and Monday with NVD.  Pain is all the time but is worse when moves.  No fevers.  Denies further NVD or hematuria.  Abdomen soft and nondistended.

## 2017-09-23 NOTE — ED Notes (Signed)
First Nurse Note:  Patient here complaining of back pain that began yesterday.  Placed in [place].  Alert and oriented.

## 2017-09-23 NOTE — ED Provider Notes (Signed)
[facility] Emergency Department Provider Note  ____________________________________________   First MD Initiated Contact with Patient 09/23/17 1008     (approximate)  I have reviewed the triage vital signs and the nursing notes.   HISTORY  Chief Complaint Back Pain   HPI [name] is a 57 y.o. female is here with complaint of low back pain with radiation into her left leg.  Patient states that there is been no recent injury to her back.  She denies any nausea, vomiting, or diarrhea.  She denies dysuria.  She has been taking over-the-counter medication with minimal relief.  She states the radiation from her back stops about mid thigh.  She continues to ambulate without assistance.   Past Medical History:  Diagnosis Date  . (HFpEF) heart failure with preserved ejection fraction ([place])    a. 06/2016 Echo: EF 50%, no rwma, mild to mod TR.  [name] Anemia   . Arthritis   . Bacterial vaginitis   . Blood in stool   . Brachial neuritis or radiculitis NOS   . 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 being followed by neurosurgery/eye doctor and pcp. monitoring size.causes sinus problems  . Bronchitis   . Cardiac arrhythmia   . Cervical neck pain with evidence of disc disease    C5/6 disease, MRI done late 2012 - no records available  . Chronic pain   . Cocaine abuse, in remission ([place])    clean x 24 years  . COPD (chronic obstructive pulmonary disease) (HCC)    many inhalers  . Coronary artery disease    a. PCI of LCX 2003; b. PCI of the LAD 2012 with a (2.5 x 8 mm BMS);  c.s/p CABG 4/12:  L-LAD, S-Dx, S-OM, S-RCA (Dr. Prescott Gum);  d. 01/2012 MV: inf infarct, attenuation, no ischemia.  . Depression   . Diabetes mellitus without complication ([place])   . Family history of colon cancer   . Generalized headaches    frequent  . GERD (gastroesophageal reflux disease)   . Heart murmur   . Hematochezia   . Hepatitis    history of hepatitis b  .  History of cervical cancer    s/p cryotherapy  . History of drug abuse    cocaine, marijuana, clean since 1989  . History of hepatitis B    from eating undercooked liver  . History of kidney stones   . History of MI (myocardial infarction)   . Hyperlipidemia   . Hypertension   . Myocardial infarction [facility]) 2003, 2012  . PAD (peripheral artery disease) (HCC)    s/p Right SFA atherectomy and PTA 01/15/11, normal ABIs 01/2011  . Polyp of colon   . Seasonal allergies   . Sleep apnea    uses cpap  . Smoking history    quit 07/2010  . Systolic CHF, chronic (HCC)    mild: echo 08/2010 - mildly reduced EF 40-45%, mild diffuse hypokinesis  . Thyroid disease   . Urine incontinence     Patient Active Problem List   Diagnosis Date Noted  . Abnormal feces   . Benign neoplasm of ascending colon   . Intractable vomiting with nausea   . Acute esophagogastric ulcer   . Gastritis without bleeding   . Vasomotor flushing 11/24/2016  . Morbid obesity ([place]) 10/29/2015  . Back ache 06/19/2015  . Colon polyp 06/19/2015  . CCF (congestive cardiac failure) ([place]) 06/19/2015  . Family history of colon cancer 06/19/2015  .  Orthostatic hypotension 04/30/2015  . Hematochezia 01/11/2015  . History of colonic polyps 01/11/2015  . Atypical chest pain 01/20/2014  . Diarrhea 06/03/2013  . Bronchitis 01/03/2013  . Right knee pain 12/20/2011  . HTN (hypertension) 12/20/2011  . Systolic CHF, chronic ([place])   . History of cervical cancer   . Depression   . GERD (gastroesophageal reflux disease)   . Seasonal allergies   . History of drug abuse   . Neck pain 05/07/2011  . PAD (peripheral artery disease) ([place]) 02/03/2011  . 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 Smoking history 12/16/2010  . Hyperlipidemia 08/23/2010  . Coronary atherosclerosis of native coronary artery 08/23/2010  . CLAUDICATION 08/23/2010  . CAROTID BRUIT 08/23/2010  . SHORTNESS OF BREATH 08/23/2010    Past Surgical History:  Procedure  Laterality Date  . ABDOMINAL HYSTERECTOMY    . CHOLECYSTECTOMY  1980  . COLONOSCOPY  2008   3 polyps  . COLONOSCOPY WITH PROPOFOL N/A 02/06/2015   Procedure: COLONOSCOPY WITH PROPOFOL;  Surgeon: Lucilla Lame, MD;  Location: [facility] ENDOSCOPY;  Service: Endoscopy;  Laterality: N/A;  . COLONOSCOPY WITH PROPOFOL N/A 01/27/2017   Procedure: COLONOSCOPY WITH PROPOFOL;  Surgeon: Lucilla Lame, MD;  [facility] ENDOSCOPY;  Service: Endoscopy;  Laterality: N/A;  . CORONARY ANGIOPLASTY     w/ stent placement x2  . CORONARY ARTERY BYPASS GRAFT  2012   Dr [name]  . CORONARY STENT PLACEMENT  2003   S/P MI  . CORONARY STENT PLACEMENT  2007   [place]  . ESOPHAGOGASTRODUODENOSCOPY (EGD) WITH PROPOFOL N/A 02/06/2015   Procedure: ESOPHAGOGASTRODUODENOSCOPY (EGD) WITH PROPOFOL;  Surgeon: Lucilla Lame, MD;  Location: [facility] ENDOSCOPY;  Service: Endoscopy;  Laterality: N/A;  . ESOPHAGOGASTRODUODENOSCOPY (EGD) WITH PROPOFOL N/A 01/27/2017   Procedure: ESOPHAGOGASTRODUODENOSCOPY (EGD) WITH PROPOFOL;  Surgeon: Lucilla Lame, MD;  Location: [facility] ENDOSCOPY;  Service: Endoscopy;  Laterality: N/A;  . FEMORAL ARTERY STENT  10/2010   right sided (Dr. Burt Knack)  . KNEE ARTHROSCOPY WITH MEDIAL MENISECTOMY Right 08/20/2017   Procedure: KNEE ARTHROSCOPY WITH MEDIAL  AND LATERAL MENISECTOMY;  Surgeon: Hessie Knows, MD;  Location: [facility] ORS;  Service: Orthopedics;  Laterality: Right;  . TUBAL LIGATION      Prior to Admission medications   Medication Sig Start Date End Date Taking? Authorizing Provider  acetaminophen (TYLENOL) 500 MG tablet Take 1,000 mg by mouth every 6 (six) hours as needed for mild pain or headache.    [provider]  albuterol (PROVENTIL HFA;VENTOLIN HFA) 108 (90 Base) MCG/ACT inhaler Inhale 2 puffs into the lungs every 6 (six) hours as needed for wheezing or shortness of breath. 04/10/16   Wilhelmina Mcardle, MD  aspirin 81 MG tablet Take 1 tablet (81 mg total) by mouth daily. 12/09/16   Minna Merritts, MD   Azelastine HCl 0.15 % SOLN Place 2 sprays into both nostrils daily as needed (allergies).  10/31/16   [provider]  cholecalciferol (VITAMIN D) 1000 units tablet Take 1,000 Units by mouth daily.    [provider]  cyclobenzaprine (FLEXERIL) 10 MG tablet Take 10 mg by mouth 3 (three) times daily as needed for muscle spasms.    [provider]  esomeprazole (NEXIUM) 40 MG capsule Take 40 mg by mouth daily. Reported on 09/04/2015 05/31/15   [provider]  ezetimibe (ZETIA) 10 MG tablet TAKE ONE TABLET BY MOUTH ONCE DAILY 11/24/16   Minna Merritts, MD  fluticasone (FLONASE) 50 MCG/ACT nasal spray Place 2 sprays into both nostrils  daily as needed for allergies.     [provider]  furosemide (LASIX) 20 MG tablet TAKE ONE TABLET BY MOUTH ONCE DAILY FOR SWELLING. MAY TAKE ONE ADDITIONAL TABLET AS NEEDED FOR SWELLING OR FLUID Patient taking differently: TAKE ONE TABLET BY MOUTH ONCE DAILY AND ANOTHER DOSE IF NEEDED FOR FLUID BUILD UP 02/19/17   Minna Merritts, MD  HYDROcodone-acetaminophen (NORCO) 5-325 MG tablet Take 1-2 tablets by mouth every 6 (six) hours as needed for moderate pain. 08/20/17   Hessie Knows, MD  isosorbide mononitrate (IMDUR) 30 MG 24 hr tablet TAKE ONE TABLET BY MOUTH TWICE DAILY 08/20/17   Minna Merritts, MD  isosorbide mononitrate (ISMO,MONOKET) 10 MG tablet Take 10 mg by mouth 2 (two) times daily.    [provider]  KLOR-CON M10 10 MEQ tablet TAKE ONE TABLET BY MOUTH ONCE DAILY 08/31/17   Minna Merritts, MD  latanoprost (XALATAN) 0.005 % ophthalmic solution Place 1 drop into both eyes at bedtime.    [provider]  lisinopril (PRINIVIL,ZESTRIL) 5 MG tablet Take 1 tablet (5 mg total) by mouth daily. 07/10/17   Minna Merritts, MD  metoprolol tartrate (LOPRESSOR) 25 MG tablet TAKE ONE TABLET BY MOUTH TWICE DAILY Patient taking differently: Take 25 mg by mouth 2 (two) times daily.  04/30/15   Minna Merritts,  MD  nitroGLYCERIN (NITROSTAT) 0.4 MG SL tablet Place 1 tablet (0.4 mg total) under the tongue every 5 (five) minutes as needed. Patient taking differently: Place 0.4 mg under the tongue every 5 (five) minutes as needed for chest pain.  07/10/17   Minna Merritts, MD  PARoxetine (PAXIL) 10 MG tablet Take 10 mg by mouth daily. 02/24/17   [provider]  predniSONE (DELTASONE) 10 MG tablet Take 6 tablets  today, on day 2 take 5 tablets, day 3 take 4 tablets, day 4 take 3 tablets, day 5 take  2 tablets and 1 tablet the last day 09/23/17   Johnn Hai, PA-C  PRENATAL VIT-FE FUM-FE BISG-FA PO Take by mouth.    [provider]  rosuvastatin (CRESTOR) 20 MG tablet Take 20 mg by mouth daily.  10/31/16   [provider]  umeclidinium-vilanterol (ANORO ELLIPTA) 62.5-25 MCG/INH AEPB Inhale 1 puff into the lungs daily. 05/30/16   Wilhelmina Mcardle, MD    Allergies Latex; Shellfish allergy; Sulfonamide derivatives; and Watermelon [citrullus vulgaris]  Family History  Problem Relation Age of Onset  . Hypertension Father   . Heart failure Father   . Diabetes Father   . Colon cancer Father 45  . Glaucoma Father   . Breast cancer Mother 41       breast cancer, late 36's  . Brain cancer Sister   . Coronary artery disease Neg Hx   . Stroke Neg Hx     Social History Social History   Tobacco Use  . Smoking status: Former Smoker    Packs/day: 1.00    Years: 38.00    Pack years: 38.00    Types: Cigarettes    Last attempt to quit: 08/12/2010    Years since quitting: 7.1  . Smokeless tobacco: Never Used  Substance Use Topics  . Alcohol use: No  . Drug use: No    Comment: Remote Hx (crack cocaine and marijuana)    Review of Systems Constitutional: No fever/chills Cardiovascular: Denies chest pain. Respiratory: Denies shortness of breath. Gastrointestinal: No abdominal pain.  No nausea, no vomiting.  No diarrhea.  Genitourinary: Negative for  dysuria. Musculoskeletal: Positive for low back pain with radiation to the left upper thigh. Skin: Negative for rash. Neurological: Negative for headaches, focal weakness or numbness. ____________________________________________   PHYSICAL EXAM:  VITAL SIGNS: ED Triage Vitals [09/23/17 0946]  Enc Vitals Group     BP 100/66     Pulse Rate 86     Resp 18     Temp 97.7 F (36.5 C)     Temp Source Oral     SpO2 99 %     Weight      Height      Head Circumference      Peak Flow      Pain Score      Pain Loc      Pain Edu?      Excl. in [place]?    Constitutional: Alert and oriented. Well appearing and in no acute distress. Eyes: Conjunctivae are normal.  Head: Atraumatic. Neck: No stridor.   Cardiovascular: Normal rate, regular rhythm. Grossly normal heart sounds.  Good peripheral circulation. Respiratory: Normal respiratory effort.  No retractions. Lungs CTAB. Gastrointestinal: Soft and nontender. No distention.  Musculoskeletal: On examination of the back there is no gross deformity however there is moderate tenderness on palpation of the lumbar spine at L5-S1 area.  She is also tender to the right SI joint area but more pronounced on the left and surrounding soft tissue.  Good muscle strength bilaterally. Neurologic:  Normal speech and language. No gross focal neurologic deficits are appreciated.  Reflexes 1+ bilaterally.  No gait instability. Skin:  Skin is warm, dry and intact. No rash noted. Psychiatric: Mood and affect are normal. Speech and behavior are normal.  ____________________________________________   LABS (all labs ordered are listed, but only abnormal results are displayed)  Labs Reviewed  URINALYSIS, COMPLETE (UACMP) WITH MICROSCOPIC - Abnormal; Notable for the following components:      Result Value   Color, Urine STRAW (*)    APPearance CLEAR (*)    Squamous Epithelial / LPF 0-5 (*)    All other components within normal limits  GLUCOSE, CAPILLARY -  Abnormal; Notable for the following components:   Glucose-Capillary 104 (*)    All other components within normal limits  CBG MONITORING, ED    PROCEDURES  Procedure(s) performed: None  Procedures  Critical Care performed: No  ____________________________________________   INITIAL IMPRESSION / ASSESSMENT AND PLAN / ED COURSE Patient's fasting blood sugar was 104.  Patient is aware that she needs to be mindful of what she is eating while she is taking the prednisone as it will elevate her blood sugar.  Patient was placed on a tapering dose starting with 60 mg and tapering over the next 6 days.  She is to follow-up with her PCP if any continued problems with her back.  ____________________________________________   FINAL CLINICAL IMPRESSION(S) / ED DIAGNOSES  Final diagnoses:  Acute left-sided low back pain with left-sided sciatica     ED Discharge Orders        Ordered    predniSONE (DELTASONE) 10 MG tablet     09/23/17 1055       Note:  This document was prepared using Dragon voice recognition software and may include unintentional dictation errors.    Johnn Hai, PA-C 09/23/17 1103    Earleen Newport, MD 09/23/17 [phone]

## 2017-09-24 ENCOUNTER — Ambulatory Visit: Payer: Medicare HMO

## 2017-09-24 ENCOUNTER — Other Ambulatory Visit: Payer: Medicare HMO

## 2017-09-25 DIAGNOSIS — I2510 Atherosclerotic heart disease of native coronary artery without angina pectoris: Secondary | ICD-10-CM | POA: Diagnosis not present

## 2017-09-25 DIAGNOSIS — I10 Essential (primary) hypertension: Secondary | ICD-10-CM | POA: Diagnosis not present

## 2017-09-25 DIAGNOSIS — M5432 Sciatica, left side: Secondary | ICD-10-CM | POA: Diagnosis not present

## 2017-09-25 DIAGNOSIS — E782 Mixed hyperlipidemia: Secondary | ICD-10-CM | POA: Diagnosis not present

## 2017-09-25 DIAGNOSIS — E119 Type 2 diabetes mellitus without complications: Secondary | ICD-10-CM | POA: Diagnosis not present

## 2017-09-29 ENCOUNTER — Ambulatory Visit
Admission: RE | Admit: 2017-09-29 | Discharge: 2017-09-29 | Disposition: A | Discharge status: Home / Self Care | Payer: Medicare HMO | Source: Ambulatory Visit | Attending: Internal Medicine | Admitting: Internal Medicine

## 2017-09-29 DIAGNOSIS — N6489 Other specified disorders of breast: Secondary | ICD-10-CM | POA: Insufficient documentation

## 2017-09-29 DIAGNOSIS — R928 Other abnormal and inconclusive findings on diagnostic imaging of breast: Secondary | ICD-10-CM | POA: Diagnosis not present

## 2017-09-29 DIAGNOSIS — M25561 Pain in right knee: Secondary | ICD-10-CM | POA: Diagnosis not present

## 2017-10-05 DIAGNOSIS — M25561 Pain in right knee: Secondary | ICD-10-CM | POA: Diagnosis not present

## 2017-10-08 DIAGNOSIS — M62830 Muscle spasm of back: Secondary | ICD-10-CM | POA: Diagnosis not present

## 2017-10-08 DIAGNOSIS — E782 Mixed hyperlipidemia: Secondary | ICD-10-CM | POA: Diagnosis not present

## 2017-10-08 DIAGNOSIS — I2510 Atherosclerotic heart disease of native coronary artery without angina pectoris: Secondary | ICD-10-CM | POA: Diagnosis not present

## 2017-10-08 DIAGNOSIS — I10 Essential (primary) hypertension: Secondary | ICD-10-CM | POA: Diagnosis not present

## 2017-10-08 DIAGNOSIS — E119 Type 2 diabetes mellitus without complications: Secondary | ICD-10-CM | POA: Diagnosis not present

## 2017-10-13 NOTE — Progress Notes (Signed)
Cardiology Office Note  Date:  10/15/2017   ID:  [name], DOB 1961/02/19, MRN [id number]  PCP:  Perrin Maltese, MD   Chief Complaint  Patient presents with  . other    Follow up from [facility]; chest pain & back pain. Meds reviewed by the pt. verbally. Pt. c/o shoulder, left arm, chest and back pain.     HPI:  57 yo woman with a  long smoking history,   CAD s/p  NSTEMI at [facility]  1/12, with BMS to pLAD (80% stenosis),  residual severe stenosis of the RCA/nondominant small vessel,  continued chest pain,  cardiac catheterization showing distal left main stenosis involving a high grade ostial LAD stenosis and circumflex disease approximately 90%, 90% diagonal disease, 80% nondominant RCA disease with ejection fraction 45%,  CABG x 4 in 2012 with a LIMA to the LAD, vein graft to the diagonal, vein graft to the marginal and vein graft to the RCA,   peripheral vascular disease,  S/p atherectomy and PTA of her right SFA on 01/15/11,   outpatient stress test  July 2013 , OSA who presents for routine followup of her coronary artery disease  On her last clinic visit we added lisinopril 5 mg daily Ejection fraction low 40 to 45% in September 2018 Prior ejection fraction December 2017 was 50% significant artifact on Stress study September 2018  Even on her visit today still having low back pain Pain also in her upper back, lower legs Denies any chest pain concerning for angina  Previous emergency room visits reviewed with her In the emergency room September 23, 2017 with low back pain radiating to left leg Placed on steroid taper Hospital records reviewed with the patient in detail  Was in the ER: 06/16/2017, chest pain, more back spasms Elevated d-dimer Ct chest no PE  Stress test 04/21/2017: Artifact, grossly no large regions of ischemia  On lasix every other day and as needed for SOB  Feels that she needs a  total knee replacement but this has been put on hold   Blood in stool,  see Dr. Allen Norris for EGD 01/01/2017  Headaches, previous MRI, MRI reviewed showing enlargement of right side of pituitary, lesion 1 cm "Cholesterol high", compliant with her Crestor 20 and Zetia   reports having diagnosis of OSA   denies any symptoms of chest pain, no angina Continued back pain, History of epidural shots   no exercise program   EKG personally reviewed by myself on todays visit Shows normal sinus rhythm with rate 61 bpm T-wave abnormality precordial leads No change from previous EKGs  Other past medical history Prior  outpatient stress test  July 2013 showed significant breast attenuation artifact, inferior wall artifact from GI uptake. Overall no significant ischemia. History of chronic left arm and upper left chest  pain and was seen pain clinic. Prior diagnosis of carpal tunnel syndrome  occasional left breast pain radiating to her mediastinum.    Previously seen by ear nose throat for vestibular testing as a cause of her dizziness.  EGD and colonoscopy which by her report showed hiatal hernia. Prior problems with swallowing  She has had MRI of the neck showing cervical disc disease  Lab work shows total cholesterol 151, LDL 79, HDL 52, hemoglobin A1c 6.1  Echocardiogram in the hospital showed normal LV systolic function ejection fraction 50-55%, mild MR, mild to moderate TR with right ventricular systolic pressures 83-41  PMH:   has a past medical history of (HFpEF)  heart failure with preserved ejection fraction ([place]), Anemia, Arthritis, Bacterial vaginitis, Blood in stool, Brachial neuritis or radiculitis NOS, Brain tumor (benign) ([place]) (07/2017), Bronchitis, Cardiac arrhythmia, Cervical neck pain with evidence of disc disease, Chronic pain, Cocaine abuse, in remission ([place]), COPD (chronic obstructive pulmonary disease) ([place]), Coronary artery disease, Depression, Diabetes mellitus without complication ([place]), Family history of colon cancer, Generalized headaches, GERD  (gastroesophageal reflux disease), Heart murmur, Hematochezia, Hepatitis, History of cervical cancer, History of drug abuse, History of hepatitis B, History of kidney stones, History of MI (myocardial infarction), Hyperlipidemia, Hypertension, Myocardial infarction ([place]) (2003, 2012), PAD (peripheral artery disease) ([place]), Polyp of colon, Seasonal allergies, Sleep apnea, Smoking history, Systolic CHF, chronic ([place]), Thyroid disease, and Urine incontinence.  PSH:    Past Surgical History:  Procedure Laterality Date  . ABDOMINAL HYSTERECTOMY    . CHOLECYSTECTOMY  1980  . COLONOSCOPY  2008   3 polyps  . COLONOSCOPY WITH PROPOFOL N/A 02/06/2015   Procedure: COLONOSCOPY WITH PROPOFOL;  Surgeon: Lucilla Lame, MD;  Location: [facility] ENDOSCOPY;  Service: Endoscopy;  Laterality: N/A;  . COLONOSCOPY WITH PROPOFOL N/A 01/27/2017   Procedure: COLONOSCOPY WITH PROPOFOL;  Surgeon: Lucilla Lame, MD;  Location: [facility] ENDOSCOPY;  Service: Endoscopy;  Laterality: N/A;  . CORONARY ANGIOPLASTY     w/ stent placement x2  . CORONARY ARTERY BYPASS GRAFT  2012   Dr [name]  . CORONARY STENT PLACEMENT  2003   S/P MI  . CORONARY STENT PLACEMENT  2007   [place]  . ESOPHAGOGASTRODUODENOSCOPY (EGD) WITH PROPOFOL N/A 02/06/2015   Procedure: ESOPHAGOGASTRODUODENOSCOPY (EGD) WITH PROPOFOL;  Surgeon: Lucilla Lame, MD;  Location: [facility] ENDOSCOPY;  Service: Endoscopy;  Laterality: N/A;  . ESOPHAGOGASTRODUODENOSCOPY (EGD) WITH PROPOFOL N/A 01/27/2017   Procedure: ESOPHAGOGASTRODUODENOSCOPY (EGD) WITH PROPOFOL;  Surgeon: Lucilla Lame, MD;  Location: [facility] ENDOSCOPY;  Service: Endoscopy;  Laterality: N/A;  . FEMORAL ARTERY STENT  10/2010   right sided (Dr. Burt Knack)  . KNEE ARTHROSCOPY WITH MEDIAL MENISECTOMY Right 08/20/2017   Procedure: KNEE ARTHROSCOPY WITH MEDIAL  AND LATERAL MENISECTOMY;  Surgeon: Hessie Knows, MD;  Location: [facility] ORS;  Service: Orthopedics;  Laterality: Right;  . TUBAL LIGATION      Current Outpatient Medications   Medication Sig Dispense Refill  . acetaminophen (TYLENOL) 500 MG tablet Take 1,000 mg by mouth every 6 (six) hours as needed for mild pain or headache.    . albuterol (PROVENTIL HFA;VENTOLIN HFA) 108 (90 Base) MCG/ACT inhaler Inhale 2 puffs into the lungs every 6 (six) hours as needed for wheezing or shortness of breath. 1 Inhaler 3  . aspirin 81 MG tablet Take 1 tablet (81 mg total) by mouth daily. 90 tablet 3  . Azelastine HCl 0.15 % SOLN Place 2 sprays into both nostrils daily as needed (allergies).     . cholecalciferol (VITAMIN D) 1000 units tablet Take 1,000 Units by mouth daily.    . cyclobenzaprine (FLEXERIL) 10 MG tablet Take 10 mg by mouth 3 (three) times daily as needed for muscle spasms.    [name] esomeprazole (NEXIUM) 40 MG capsule Take 40 mg by mouth daily. Reported on 09/04/2015    . ezetimibe (ZETIA) 10 MG tablet TAKE ONE TABLET BY MOUTH ONCE DAILY 90 tablet 3  . fluticasone (FLONASE) 50 MCG/ACT nasal spray Place 2 sprays into both nostrils daily as needed for allergies.     . furosemide (LASIX) 20 MG tablet TAKE ONE TABLET BY MOUTH ONCE DAILY FOR SWELLING. MAY TAKE ONE ADDITIONAL TABLET AS NEEDED FOR SWELLING OR  FLUID (Patient taking differently: TAKE ONE TABLET BY MOUTH ONCE DAILY AND ANOTHER DOSE IF NEEDED FOR FLUID BUILD UP) 180 tablet 3  . HYDROcodone-acetaminophen (NORCO) 5-325 MG tablet Take 1-2 tablets by mouth every 6 (six) hours as needed for moderate pain. 30 tablet 0  . isosorbide mononitrate (IMDUR) 30 MG 24 hr tablet TAKE ONE TABLET BY MOUTH TWICE DAILY 180 tablet 3  . KLOR-CON M10 10 MEQ tablet TAKE ONE TABLET BY MOUTH ONCE DAILY 90 tablet 3  . latanoprost (XALATAN) 0.005 % ophthalmic solution Place 1 drop into both eyes at bedtime.    [name] lisinopril (PRINIVIL,ZESTRIL) 5 MG tablet Take 1 tablet (5 mg total) by mouth daily. 90 tablet 3  . metoprolol tartrate (LOPRESSOR) 25 MG tablet TAKE ONE TABLET BY MOUTH TWICE DAILY (Patient taking differently: Take 25 mg by mouth 2 (two)  times daily. ) 180 tablet 3  . nitroGLYCERIN (NITROSTAT) 0.4 MG SL tablet Place 1 tablet (0.4 mg total) under the tongue every 5 (five) minutes as needed. (Patient taking differently: Place 0.4 mg under the tongue every 5 (five) minutes as needed for chest pain. ) 25 tablet 6  . PARoxetine (PAXIL) 10 MG tablet Take 10 mg by mouth daily.    . predniSONE (DELTASONE) 10 MG tablet Take 6 tablets  today, on day 2 take 5 tablets, day 3 take 4 tablets, day 4 take 3 tablets, day 5 take  2 tablets and 1 tablet the last day 21 tablet 0  . PRENATAL VIT-FE FUM-FE BISG-FA PO Take by mouth.    . rosuvastatin (CRESTOR) 20 MG tablet Take 20 mg by mouth daily.     [name] umeclidinium-vilanterol (ANORO ELLIPTA) 62.5-25 MCG/INH AEPB Inhale 1 puff into the lungs daily. 60 each 10   No current facility-administered medications for this visit.      Allergies:   Latex; Shellfish allergy; Sulfonamide derivatives; Watermelon [citrullus vulgaris]; and Other   Social History:  The patient  reports that she quit smoking about 7 years ago. Her smoking use included cigarettes. She has a 38.00 pack-year smoking history. She has never used smokeless tobacco. She reports that she does not drink alcohol or use drugs.   Family History:   family history includes Brain cancer in her sister; Breast cancer (age of onset: 60) in her mother; Colon cancer (age of onset: 74) in her father; Diabetes in her father; Glaucoma in her father; Heart failure in her father; Hypertension in her father.    Review of Systems: Review of Systems  Constitutional: Negative.   Respiratory: Negative.   Gastrointestinal: Negative.   Musculoskeletal: Positive for back pain, joint pain and myalgias.  Neurological: Negative.   Psychiatric/Behavioral: Negative.   All other systems reviewed and are negative.    PHYSICAL EXAM: VS:  BP 102/60 (BP Location: Left Arm, Patient Position: Sitting, Cuff Size: Normal)   Pulse 61   Ht 5\' 5"  (1.651 m)   Wt 212 lb  12 oz (96.5 kg)   BMI 35.40 kg/m  , BMI Body mass index is 35.4 kg/m. Constitutional:  oriented to person, place, and time. No distress.  HENT:  Head: Normocephalic and atraumatic.  Eyes:  no discharge. No scleral icterus.  Neck: Normal range of motion. Neck supple. No JVD present.  Cardiovascular: Normal rate, regular rhythm, normal heart sounds and intact distal pulses. Exam reveals no gallop and no friction rub. No edema No murmur heard. Pulmonary/Chest: Effort normal and breath sounds normal. No stridor. No respiratory distress.  no wheezes.  no rales.  no tenderness.  Abdominal: Soft.  no distension.  no tenderness.  Musculoskeletal: Normal range of motion.  no  tenderness or deformity.  Neurological:  normal muscle tone. Coordination normal. No atrophy Skin: Skin is warm and dry. No rash noted. not diaphoretic.  Psychiatric:  normal mood and affect. behavior is normal. Thought content normal.   Recent Labs: 08/13/2017: BUN 15; Creatinine, Ser 0.99; Platelets 184 08/20/2017: Hemoglobin 14.3; Potassium 3.5; Sodium 145    Lipid Panel Lab Results  Component Value Date   CHOL 235 (H) 07/11/2011   HDL 58 07/11/2011   LDLCALC 149 (H) 07/11/2011   TRIG 140 07/11/2011      Wt Readings from Last 3 Encounters:  10/15/17 212 lb 12 oz (96.5 kg)  08/13/17 210 lb (95.3 kg)  07/10/17 219 lb 4 oz (99.5 kg)       ASSESSMENT AND PLAN:  CAD/ CABG:  Denies any symptoms concerning for angina, No further testing Equivocal stress test done in 2018 with significant artifact  in addition to low-dose aspirin we will add xarelto 2.5 mg BID Given she has PAD Otherwise stable  Secondary hypertension, unspecified -  Blood pressure is well controlled on today's visit. No changes made to the medications.  Chest pain, unspecified chest pain type -  Currently with no symptoms of angina.  Continue current medication regimen.  OSA on CPAP  Previously seen by pulmonary Tested positive per the  patient Management per primary care  COPD   stopped smoking several years ago Stable  Chronic pain Chest, legs, back, arms Managed by chiropractic, recommended she consider massage  Leg swelling Takes Lasix every other day, extra Lasix for shortness of breath Tender to palpation   Total encounter time more than 45 minutes  Greater than 50% was spent in counseling and coordination of care with the patient  Disposition:   F/U  6 months    Orders Placed This Encounter  Procedures  . EKG 12-Lead     Signed, Esmond Plants, M.D., Ph.D. 10/15/2017  [place], [place]

## 2017-10-15 ENCOUNTER — Ambulatory Visit (INDEPENDENT_AMBULATORY_CARE_PROVIDER_SITE_OTHER): Payer: Medicare HMO | Admitting: Cardiovascular Disease

## 2017-10-15 ENCOUNTER — Encounter: Payer: Self-pay | Admitting: Cardiovascular Disease

## 2017-10-15 VITALS — BP 102/60 | HR 61 | Ht 65.0 in | Wt 212.8 lb

## 2017-10-15 DIAGNOSIS — I25118 Atherosclerotic heart disease of native coronary artery with other forms of angina pectoris: Secondary | ICD-10-CM

## 2017-10-15 DIAGNOSIS — I739 Peripheral vascular disease, unspecified: Secondary | ICD-10-CM

## 2017-10-15 DIAGNOSIS — I5022 Chronic systolic (congestive) heart failure: Secondary | ICD-10-CM

## 2017-10-15 DIAGNOSIS — Z951 Presence of aortocoronary bypass graft: Secondary | ICD-10-CM | POA: Diagnosis not present

## 2017-10-15 DIAGNOSIS — Z87891 Personal history of nicotine dependence: Secondary | ICD-10-CM | POA: Diagnosis not present

## 2017-10-15 DIAGNOSIS — E782 Mixed hyperlipidemia: Secondary | ICD-10-CM

## 2017-10-15 MED ORDER — RIVAROXABAN 2.5 MG PO TABS: 2.5000 mg | ORAL_TABLET | Freq: Two times a day (BID) | ORAL | 11 refills | Status: DC

## 2017-10-15 NOTE — Patient Instructions (Addendum)
Medication Instructions:  Your physician has recommended you make the following change in your medication:  1. START Xarelto 2.5 mg Twice a day  Medication Samples have been provided to the patient.  Drug name: Xarelto       Strength: 2.5 mg        Qty: 4 bottles  LOT: [id number]  Exp.Date: 9/21   Labwork:  No new labs needed  Testing/Procedures:  No further testing at this time   Follow-Up: It was a pleasure seeing you in the office today. Please call us if you have new issues that need to be addressed before your next appt.  [phone]  Dr. Olivia Mackie [name] [phone]  Your physician wants you to follow-up in: 6 months.  You will receive a reminder letter in the mail two months in advance. If you don't receive a letter, please call our office to schedule the follow-up appointment.  If you need a refill on your cardiac medications before your next appointment, please call your pharmacy.  For educational health videos Log in to : [email or web address] Or : [email or web address], password : [email or web address]

## 2017-10-30 DIAGNOSIS — Z9889 Other specified postprocedural states: Secondary | ICD-10-CM | POA: Diagnosis not present

## 2017-10-30 DIAGNOSIS — M1711 Unilateral primary osteoarthritis, right knee: Secondary | ICD-10-CM | POA: Diagnosis not present

## 2017-11-17 DIAGNOSIS — D352 Benign neoplasm of pituitary gland: Secondary | ICD-10-CM | POA: Diagnosis not present

## 2017-12-03 DIAGNOSIS — Z9889 Other specified postprocedural states: Secondary | ICD-10-CM | POA: Diagnosis not present

## 2017-12-03 DIAGNOSIS — M1711 Unilateral primary osteoarthritis, right knee: Secondary | ICD-10-CM | POA: Diagnosis not present

## 2017-12-11 DIAGNOSIS — D352 Benign neoplasm of pituitary gland: Secondary | ICD-10-CM | POA: Diagnosis not present

## 2017-12-13 ENCOUNTER — Emergency Department
Admission: EM | Admit: 2017-12-13 | Discharge: 2017-12-13 | Disposition: A | Discharge status: Home / Self Care | Payer: Medicare HMO | Attending: Emergency Medicine | Admitting: Emergency Medicine

## 2017-12-13 ENCOUNTER — Encounter: Payer: Self-pay | Admitting: Emergency Medicine

## 2017-12-13 ENCOUNTER — Other Ambulatory Visit: Payer: Self-pay

## 2017-12-13 DIAGNOSIS — Z79899 Other long term (current) drug therapy: Secondary | ICD-10-CM | POA: Insufficient documentation

## 2017-12-13 DIAGNOSIS — Y999 Unspecified external cause status: Secondary | ICD-10-CM | POA: Diagnosis not present

## 2017-12-13 DIAGNOSIS — J449 Chronic obstructive pulmonary disease, unspecified: Secondary | ICD-10-CM | POA: Diagnosis not present

## 2017-12-13 DIAGNOSIS — Y939 Activity, unspecified: Secondary | ICD-10-CM | POA: Diagnosis not present

## 2017-12-13 DIAGNOSIS — S161XXA Strain of muscle, fascia and tendon at neck level, initial encounter: Secondary | ICD-10-CM | POA: Diagnosis not present

## 2017-12-13 DIAGNOSIS — I5022 Chronic systolic (congestive) heart failure: Secondary | ICD-10-CM | POA: Diagnosis not present

## 2017-12-13 DIAGNOSIS — Y929 Unspecified place or not applicable: Secondary | ICD-10-CM | POA: Insufficient documentation

## 2017-12-13 DIAGNOSIS — E119 Type 2 diabetes mellitus without complications: Secondary | ICD-10-CM | POA: Insufficient documentation

## 2017-12-13 DIAGNOSIS — Z87891 Personal history of nicotine dependence: Secondary | ICD-10-CM | POA: Insufficient documentation

## 2017-12-13 DIAGNOSIS — I110 Hypertensive heart disease with heart failure: Secondary | ICD-10-CM | POA: Insufficient documentation

## 2017-12-13 DIAGNOSIS — I2510 Atherosclerotic heart disease of native coronary artery without angina pectoris: Secondary | ICD-10-CM | POA: Diagnosis not present

## 2017-12-13 DIAGNOSIS — X58XXXA Exposure to other specified factors, initial encounter: Secondary | ICD-10-CM | POA: Insufficient documentation

## 2017-12-13 DIAGNOSIS — T148XXA Other injury of unspecified body region, initial encounter: Secondary | ICD-10-CM

## 2017-12-13 DIAGNOSIS — Z7982 Long term (current) use of aspirin: Secondary | ICD-10-CM | POA: Diagnosis not present

## 2017-12-13 DIAGNOSIS — S199XXA Unspecified injury of neck, initial encounter: Secondary | ICD-10-CM | POA: Diagnosis present

## 2017-12-13 MED ORDER — OXYCODONE-ACETAMINOPHEN 5-325 MG PO TABS
1.0000 | ORAL_TABLET | Freq: Once | ORAL | Status: AC
  Administered 2017-12-13: 1 via ORAL
  Filled 2017-12-13: qty 1

## 2017-12-13 MED ORDER — LIDOCAINE 5 % EX PTCH
1.0000 | MEDICATED_PATCH | CUTANEOUS | Status: DC
  Administered 2017-12-13: 1 via TRANSDERMAL
  Filled 2017-12-13: qty 1

## 2017-12-13 MED ORDER — CYCLOBENZAPRINE HCL 5 MG PO TABS: ORAL_TABLET | ORAL | 0 refills | Status: DC

## 2017-12-13 MED ORDER — TRAMADOL HCL 50 MG PO TABS: 50.0000 mg | ORAL_TABLET | Freq: Four times a day (QID) | ORAL | 0 refills | Status: DC | PRN

## 2017-12-13 MED ORDER — LIDOCAINE 5 % EX PTCH: 1.0000 | MEDICATED_PATCH | Freq: Two times a day (BID) | CUTANEOUS | 0 refills | Status: DC

## 2017-12-13 NOTE — ED Notes (Signed)
See triage note  States she woke up with some pain to neck for couple of days. Min relief with ice and aleve

## 2017-12-13 NOTE — ED Triage Notes (Signed)
Pt to ED via POV c/o neck pain. Pt states that the pain started on Thursday when she woke up. Pt states that pain is in her neck and extends down to her right shoulder blade. Pt reports that she is unable to turn her neck without turning the whole top part of her body. Pt is in NAD at this time.

## 2017-12-13 NOTE — ED Provider Notes (Signed)
[facility] Emergency Department Provider Note  ____________________________________________  Time seen: Approximately 10:36 AM  I have reviewed the triage vital signs and the nursing notes.   HISTORY  Chief Complaint Neck Pain    HPI [name] is a 57 y.o. female that presents emergency department for evaluation of right-sided neck pain for 3 days.  Patient woke up and felt a pulling sensation to the side of her neck.  Pain is worse with movement.  Pain increases throughout the day.  No trauma.  She has been taking Aleve for pain.  She celebrated her dog's birthday yesterday.  She had a visit with the ophthalmologist 2 days ago.  She has an appointment with PCP in 1 week.  No headaches, visual changes, shortness of breath, chest pain.   Past Medical History:  Diagnosis Date  . (HFpEF) heart failure with preserved ejection fraction ([place])    a. 06/2016 Echo: EF 50%, no rwma, mild to mod TR.  [name] Anemia   . Arthritis   . Bacterial vaginitis   . Blood in stool   . Brachial neuritis or radiculitis NOS   . Brain tumor (benign) ([place]) 07/2017   near optic nerve. being followed by neurosurgery/eye doctor and pcp. monitoring size.causes sinus problems  . Bronchitis   . Cardiac arrhythmia   . Cervical neck pain with evidence of disc disease    C5/6 disease, MRI done late 2012 - no records available  . Chronic pain   . Cocaine abuse, in remission ([place])    clean x 24 years  . COPD (chronic obstructive pulmonary disease) (HCC)    many inhalers  . Coronary artery disease    a. PCI of LCX 2003; b. PCI of the LAD 2012 with a (2.5 x 8 mm BMS);  c.s/p CABG 4/12:  L-LAD, S-Dx, S-OM, S-RCA (Dr. Prescott Gum);  d. 01/2012 MV: inf infarct, attenuation, no ischemia.  . Depression   . Diabetes mellitus without complication ([place])   . Family history of colon cancer   . Generalized headaches    frequent  . GERD (gastroesophageal reflux disease)   . Heart murmur   .  Hematochezia   . Hepatitis    history of hepatitis b  . History of cervical cancer    s/p cryotherapy  . History of drug abuse    cocaine, marijuana, clean since 1989  . History of hepatitis B    from eating undercooked liver  . History of kidney stones   . History of MI (myocardial infarction)   . Hyperlipidemia   . Hypertension   . Myocardial infarction [facility]) 2003, 2012  . PAD (peripheral artery disease) (HCC)    s/p Right SFA atherectomy and PTA 01/15/11, normal ABIs 01/2011  . Polyp of colon   . Seasonal allergies   . Sleep apnea    uses cpap  . Smoking history    quit 07/2010  . Systolic CHF, chronic (HCC)    mild: echo 08/2010 - mildly reduced EF 40-45%, mild diffuse hypokinesis  . Thyroid disease   . Urine incontinence     Patient Active Problem List   Diagnosis Date Noted  . Abnormal feces   . Benign neoplasm of ascending colon   . Intractable vomiting with nausea   . Acute esophagogastric ulcer   . Gastritis without bleeding   . Vasomotor flushing 11/24/2016  . Morbid obesity ([place]) 10/29/2015  . Back ache 06/19/2015  . Colon polyp 06/19/2015  . CCF (congestive  cardiac failure) ([place]) 06/19/2015  . Family history of colon cancer 06/19/2015  . Orthostatic hypotension 04/30/2015  . Hematochezia 01/11/2015  . History of colonic polyps 01/11/2015  . Atypical chest pain 01/20/2014  . Diarrhea 06/03/2013  . Bronchitis 01/03/2013  . Right knee pain 12/20/2011  . HTN (hypertension) 12/20/2011  . Systolic CHF, chronic ([place])   . History of cervical cancer   . Depression   . GERD (gastroesophageal reflux disease)   . Seasonal allergies   . History of drug abuse   . Neck pain 05/07/2011  . PAD (peripheral artery disease) ([place]) 02/03/2011  . 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 Smoking history 12/16/2010  . Hyperlipidemia 08/23/2010  . Coronary atherosclerosis of native coronary artery 08/23/2010  . CLAUDICATION 08/23/2010  . CAROTID BRUIT 08/23/2010  . SHORTNESS OF BREATH  08/23/2010    Past Surgical History:  Procedure Laterality Date  . ABDOMINAL HYSTERECTOMY    . CHOLECYSTECTOMY  1980  . COLONOSCOPY  2008   3 polyps  . COLONOSCOPY WITH PROPOFOL N/A 02/06/2015   Procedure: COLONOSCOPY WITH PROPOFOL;  Surgeon: Lucilla Lame, MD;  Location: [facility] ENDOSCOPY;  Service: Endoscopy;  Laterality: N/A;  . COLONOSCOPY WITH PROPOFOL N/A 01/27/2017   Procedure: COLONOSCOPY WITH PROPOFOL;  Surgeon: Lucilla Lame, MD;  Location: [facility] ENDOSCOPY;  Service: Endoscopy;  Laterality: N/A;  . CORONARY ANGIOPLASTY     w/ stent placement x2  . CORONARY ARTERY BYPASS GRAFT  2012   Dr [name]  . CORONARY STENT PLACEMENT  2003   S/P MI  . CORONARY STENT PLACEMENT  2007   [place]  . ESOPHAGOGASTRODUODENOSCOPY (EGD) WITH PROPOFOL N/A 02/06/2015   Procedure: ESOPHAGOGASTRODUODENOSCOPY (EGD) WITH PROPOFOL;  Surgeon: Lucilla Lame, MD;  Location: [facility] ENDOSCOPY;  Service: Endoscopy;  Laterality: N/A;  . ESOPHAGOGASTRODUODENOSCOPY (EGD) WITH PROPOFOL N/A 01/27/2017   Procedure: ESOPHAGOGASTRODUODENOSCOPY (EGD) WITH PROPOFOL;  Surgeon: Lucilla Lame, MD;  Location: [facility] ENDOSCOPY;  Service: Endoscopy;  Laterality: N/A;  . FEMORAL ARTERY STENT  10/2010   right sided (Dr. Burt Knack)  . KNEE ARTHROSCOPY WITH MEDIAL MENISECTOMY Right 08/20/2017   Procedure: KNEE ARTHROSCOPY WITH MEDIAL  AND LATERAL MENISECTOMY;  Surgeon: Hessie Knows, MD;  Location: [facility] ORS;  Service: Orthopedics;  Laterality: Right;  . TUBAL LIGATION      Prior to Admission medications   Medication Sig Start Date End Date Taking? Authorizing Provider  acetaminophen (TYLENOL) 500 MG tablet Take 1,000 mg by mouth every 6 (six) hours as needed for mild pain or headache.    [provider]  albuterol (PROVENTIL HFA;VENTOLIN HFA) 108 (90 Base) MCG/ACT inhaler Inhale 2 puffs into the lungs every 6 (six) hours as needed for wheezing or shortness of breath. 04/10/16   Wilhelmina Mcardle, MD  aspirin 81 MG tablet Take 1 tablet (81 mg  total) by mouth daily. 12/09/16   Minna Merritts, MD  Azelastine HCl 0.15 % SOLN Place 2 sprays into both nostrils daily as needed (allergies).  10/31/16   [provider]  cholecalciferol (VITAMIN D) 1000 units tablet Take 1,000 Units by mouth daily.    [provider]  cyclobenzaprine (FLEXERIL) 5 MG tablet Take 1-2 tablets 3 times daily as needed 12/13/17   Laban Emperor, PA-C  esomeprazole (NEXIUM) 40 MG capsule Take 40 mg by mouth daily. Reported on 09/04/2015 05/31/15   [provider]  ezetimibe (ZETIA) 10 MG tablet TAKE ONE TABLET BY MOUTH ONCE DAILY 11/24/16   Minna Merritts, MD  fluticasone (FLONASE) 50 MCG/ACT  nasal spray Place 2 sprays into both nostrils daily as needed for allergies.     [provider]  furosemide (LASIX) 20 MG tablet TAKE ONE TABLET BY MOUTH ONCE DAILY FOR SWELLING. MAY TAKE ONE ADDITIONAL TABLET AS NEEDED FOR SWELLING OR FLUID Patient taking differently: TAKE ONE TABLET BY MOUTH ONCE DAILY AND ANOTHER DOSE IF NEEDED FOR FLUID BUILD UP 02/19/17   Minna Merritts, MD  HYDROcodone-acetaminophen (NORCO) 5-325 MG tablet Take 1-2 tablets by mouth every 6 (six) hours as needed for moderate pain. 08/20/17   Hessie Knows, MD  isosorbide mononitrate (IMDUR) 30 MG 24 hr tablet TAKE ONE TABLET BY MOUTH TWICE DAILY 08/20/17   [name], Kathlene November, MD  KLOR-CON M10 10 MEQ tablet TAKE ONE TABLET BY MOUTH ONCE DAILY 08/31/17   Minna Merritts, MD  latanoprost (XALATAN) 0.005 % ophthalmic solution Place 1 drop into both eyes at bedtime.    [provider]  lidocaine (LIDODERM) 5 % Place 1 patch onto the skin every 12 (twelve) hours. Remove & Discard patch within 12 hours or as directed by MD 12/13/17 12/13/18  Laban Emperor, PA-C  lisinopril (PRINIVIL,ZESTRIL) 5 MG tablet Take 1 tablet (5 mg total) by mouth daily. 07/10/17   Minna Merritts, MD  metoprolol tartrate (LOPRESSOR) 25 MG tablet TAKE ONE TABLET BY MOUTH TWICE DAILY Patient taking  differently: Take 25 mg by mouth 2 (two) times daily.  04/30/15   Minna Merritts, MD  nitroGLYCERIN (NITROSTAT) 0.4 MG SL tablet Place 1 tablet (0.4 mg total) under the tongue every 5 (five) minutes as needed. Patient taking differently: Place 0.4 mg under the tongue every 5 (five) minutes as needed for chest pain.  07/10/17   Minna Merritts, MD  PARoxetine (PAXIL) 10 MG tablet Take 10 mg by mouth daily. 02/24/17   [provider]  predniSONE (DELTASONE) 10 MG tablet Take 6 tablets  today, on day 2 take 5 tablets, day 3 take 4 tablets, day 4 take 3 tablets, day 5 take  2 tablets and 1 tablet the last day 09/23/17   Johnn Hai, PA-C  PRENATAL VIT-FE FUM-FE BISG-FA PO Take by mouth.    [provider]  rivaroxaban (XARELTO) 2.5 MG TABS tablet Take 1 tablet (2.5 mg total) by mouth 2 (two) times daily. 10/15/17   Minna Merritts, MD  rosuvastatin (CRESTOR) 20 MG tablet Take 20 mg by mouth daily.  10/31/16   [provider]  traMADol (ULTRAM) 50 MG tablet Take 1 tablet (50 mg total) by mouth every 6 (six) hours as needed. 12/13/17 12/13/18  Laban Emperor, PA-C  umeclidinium-vilanterol (ANORO ELLIPTA) 62.5-25 MCG/INH AEPB Inhale 1 puff into the lungs daily. 05/30/16   Wilhelmina Mcardle, MD    Allergies Latex; Shellfish allergy; Sulfonamide derivatives; Watermelon [citrullus vulgaris]; and Other  Family History  Problem Relation Age of Onset  . Hypertension Father   . Heart failure Father   . Diabetes Father   . Colon cancer Father 72  . Glaucoma Father   . Breast cancer Mother 17       breast cancer, late 67's  . Brain cancer Sister   . Coronary artery disease Neg Hx   . Stroke Neg Hx     Social History Social History   Tobacco Use  . Smoking status: Former Smoker    Packs/day: 1.00    Years: 38.00    Pack years: 38.00    Types: Cigarettes    Last attempt  to quit: 08/12/2010    Years since quitting: 7.3  . Smokeless tobacco: Never Used  Substance Use  Topics  . Alcohol use: No  . Drug use: No    Types: Cocaine    Comment: Remote Hx (crack cocaine and marijuana)     Review of Systems  Constitutional: No fever/chills Cardiovascular: No chest pain. Respiratory: No SOB. Gastrointestinal: No abdominal pain.  No nausea, no vomiting.  Musculoskeletal: Positive for neck pain.  Skin: Negative for rash, abrasions, lacerations, ecchymosis. Neurological: Negative for headaches, numbness or tingling   ____________________________________________   PHYSICAL EXAM:  VITAL SIGNS: ED Triage Vitals  Enc Vitals Group     BP 12/13/17 0939 108/75     Pulse Rate 12/13/17 0939 63     Resp 12/13/17 0939 16     Temp 12/13/17 0939 98.1 F (36.7 C)     Temp Source 12/13/17 0939 Oral     SpO2 12/13/17 0939 99 %     Weight 12/13/17 0940 208 lb (94.3 kg)     Height 12/13/17 0940 5\' 5"  (1.651 m)     Head Circumference --      Peak Flow --      Pain Score 12/13/17 0940 10     Pain Loc --      Pain Edu? --      Excl. in [place]? --      Constitutional: Alert and oriented. Well appearing and in no acute distress. Eyes: Conjunctivae are normal. PERRL. EOMI. Head: Atraumatic. ENT:      Ears:      Nose: No congestion/rhinnorhea.      Mouth/Throat: Mucous membranes are moist.  Neck: No stridor.  No cervical spine tenderness to palpation. Tenderness to palpation over right trapezius muscle. Pain with right rotation. Strength equal in upper extremities bilaterally.  Cardiovascular: Normal rate, regular rhythm.  Good peripheral circulation. Respiratory: Normal respiratory effort without tachypnea or retractions. Lungs CTAB. Good air entry to the bases with no decreased or absent breath sounds. Musculoskeletal: Full range of motion to all extremities. No gross deformities appreciated. Neurologic:  Normal speech and language. No gross focal neurologic deficits are appreciated.  Skin:  Skin is warm, dry and intact. No rash noted. Psychiatric: Mood and  affect are normal. Speech and behavior are normal. Patient exhibits appropriate insight and judgement.   ____________________________________________   LABS (all labs ordered are listed, but only abnormal results are displayed)  Labs Reviewed - No data to display ____________________________________________  EKG   ____________________________________________  RADIOLOGY  No results found.  ____________________________________________    PROCEDURES  Procedure(s) performed:    Procedures    Medications  lidocaine (LIDODERM) 5 % 1 patch (1 patch Transdermal Patch Applied 12/13/17 1108)  oxyCODONE-acetaminophen (PERCOCET/ROXICET) 5-325 MG per tablet 1 tablet (1 tablet Oral Given 12/13/17 1108)     ____________________________________________   INITIAL IMPRESSION / ASSESSMENT AND PLAN / ED COURSE  Pertinent labs & imaging results that were available during my care of the patient were reviewed by me and considered in my medical decision making (see chart for details).  Review of the [place] CSRS was performed in accordance of the [place] prior to dispensing any controlled drugs.   Patient's diagnosis is consistent with muscle strain. Vital signs and exam are reassuring. Patient was told not to take NSAIDs. Patient will be discharged home with prescriptions for tramadol, flexeril, lidoderm. Patient is to follow up with PCP as directed. Patient is given ED precautions to return to the  ED for any worsening or new symptoms.     ____________________________________________  FINAL CLINICAL IMPRESSION(S) / ED DIAGNOSES  Final diagnoses:  Muscle strain      NEW MEDICATIONS STARTED DURING THIS VISIT:  ED Discharge Orders        Ordered    traMADol (ULTRAM) 50 MG tablet  Every 6 hours PRN     12/13/17 1102    cyclobenzaprine (FLEXERIL) 5 MG tablet     12/13/17 1102    lidocaine (LIDODERM) 5 %  Every 12 hours     12/13/17 1102          This chart was dictated  using voice recognition software/Dragon. Despite best efforts to proofread, errors can occur which can change the meaning. Any change was purely unintentional.    Laban Emperor, PA-C 12/13/17 [place], Randall An, MD 12/13/17 [id number]

## 2017-12-17 DIAGNOSIS — E669 Obesity, unspecified: Secondary | ICD-10-CM | POA: Diagnosis not present

## 2017-12-17 DIAGNOSIS — E785 Hyperlipidemia, unspecified: Secondary | ICD-10-CM | POA: Diagnosis not present

## 2017-12-17 DIAGNOSIS — I110 Hypertensive heart disease with heart failure: Secondary | ICD-10-CM | POA: Diagnosis not present

## 2017-12-17 DIAGNOSIS — I509 Heart failure, unspecified: Secondary | ICD-10-CM | POA: Diagnosis not present

## 2017-12-17 DIAGNOSIS — I25119 Atherosclerotic heart disease of native coronary artery with unspecified angina pectoris: Secondary | ICD-10-CM | POA: Diagnosis not present

## 2017-12-17 DIAGNOSIS — R69 Illness, unspecified: Secondary | ICD-10-CM | POA: Diagnosis not present

## 2017-12-17 DIAGNOSIS — G4730 Sleep apnea, unspecified: Secondary | ICD-10-CM | POA: Diagnosis not present

## 2017-12-17 DIAGNOSIS — J449 Chronic obstructive pulmonary disease, unspecified: Secondary | ICD-10-CM | POA: Diagnosis not present

## 2017-12-18 ENCOUNTER — Other Ambulatory Visit: Payer: Self-pay | Admitting: Neurosurgery

## 2017-12-18 ENCOUNTER — Encounter: Payer: Self-pay | Admitting: Internal Medicine

## 2017-12-18 ENCOUNTER — Ambulatory Visit (INDEPENDENT_AMBULATORY_CARE_PROVIDER_SITE_OTHER): Payer: Medicare HMO | Admitting: Internal Medicine

## 2017-12-18 VITALS — BP 90/62 | HR 61 | Temp 98.2°F | Ht 65.0 in | Wt 206.6 lb

## 2017-12-18 DIAGNOSIS — M25561 Pain in right knee: Secondary | ICD-10-CM

## 2017-12-18 DIAGNOSIS — M5416 Radiculopathy, lumbar region: Secondary | ICD-10-CM | POA: Insufficient documentation

## 2017-12-18 DIAGNOSIS — R32 Unspecified urinary incontinence: Secondary | ICD-10-CM | POA: Diagnosis not present

## 2017-12-18 DIAGNOSIS — G8929 Other chronic pain: Secondary | ICD-10-CM

## 2017-12-18 DIAGNOSIS — R51 Headache: Secondary | ICD-10-CM | POA: Diagnosis not present

## 2017-12-18 DIAGNOSIS — D352 Benign neoplasm of pituitary gland: Secondary | ICD-10-CM

## 2017-12-18 DIAGNOSIS — Z951 Presence of aortocoronary bypass graft: Secondary | ICD-10-CM | POA: Diagnosis not present

## 2017-12-18 DIAGNOSIS — I10 Essential (primary) hypertension: Secondary | ICD-10-CM

## 2017-12-18 DIAGNOSIS — E119 Type 2 diabetes mellitus without complications: Secondary | ICD-10-CM | POA: Insufficient documentation

## 2017-12-18 DIAGNOSIS — N3281 Overactive bladder: Secondary | ICD-10-CM | POA: Diagnosis not present

## 2017-12-18 DIAGNOSIS — I25118 Atherosclerotic heart disease of native coronary artery with other forms of angina pectoris: Secondary | ICD-10-CM

## 2017-12-18 DIAGNOSIS — R519 Headache, unspecified: Secondary | ICD-10-CM

## 2017-12-18 DIAGNOSIS — M542 Cervicalgia: Secondary | ICD-10-CM

## 2017-12-18 DIAGNOSIS — I5022 Chronic systolic (congestive) heart failure: Secondary | ICD-10-CM | POA: Diagnosis not present

## 2017-12-18 DIAGNOSIS — E118 Type 2 diabetes mellitus with unspecified complications: Secondary | ICD-10-CM | POA: Insufficient documentation

## 2017-12-18 HISTORY — DX: Unspecified urinary incontinence: R32

## 2017-12-18 HISTORY — DX: Other chronic pain: G89.29

## 2017-12-18 MED ORDER — OXYBUTYNIN CHLORIDE ER 5 MG PO TB24: 5.0000 mg | ORAL_TABLET | Freq: Every day | ORAL | 0 refills | Status: DC

## 2017-12-18 NOTE — Progress Notes (Signed)
Pre visit review using our clinic review tool, if applicable. No additional management support is needed unless otherwise documented below in the visit note. 

## 2017-12-18 NOTE — Patient Instructions (Signed)
I will refer you to [facility] in [place] for your neck and brain We will order MRI brain and MRI cervical 01/14/18  I will refer you to [facility] as well   Pituitary Tumors Pituitary tumors are abnormal growths found in the pituitary gland. The pituitary gland is a small organ-about the size of a dime-located in the center of the brain. It makes hormones that affect growth and the functions of other glands in the body. Most pituitary tumors are benign. This means they are noncancerous. They grow slowly and do not spread to other parts of the body. A pituitary tumor may make the pituitary gland produce too many hormones. Tumors that make hormones are called functioning tumors (those that do not make hormones are called nonfunctioning tumors). Problems that can be caused by pituitary tumors include:  Cushing disease. This disease causes fat to build up in the face, back, and chest while the arms and legs become thin.  Acromegaly. This is a condition in which the hands, feet, and face are larger than normal.  Breast milk production even though there is no pregnancy.  What are the causes? The cause of most pituitary tumors is not known. In some cases, these kinds of tumors run in a family. What increases the risk? Some cases of pituitary tumors are due to genetic factors that a person inherits that increase the likelihood of developing certain tumors, including pituitary tumors. What are the signs or symptoms?  Headaches.  Vision problems.  Weakness or low energy.  Clear fluid draining from the nose.  Changes in the sense of smell.  Feeling sick to your stomach (nauseous) and vomiting.  Problems caused by the production of too many hormones, such as: ? Infertility. ? Loss of menstrual periods in women. ? Abnormal growth. ? High blood pressure (hypertension). ? Heat or cold intolerance. ? Other skin and body changes. ? Nipple discharge. ? Decreased sexual function. How  is this diagnosed? If you develop symptoms, you will be sent for a CT scan or MRI to look for pituitary tumors. If you know that these kinds of tumors run in your family, you may need to have your blood tested regularly to monitor pituitary hormone levels. How is this treated? These tumors are best treated when they are found and diagnosed early. Treatments include:  Surgical removal of the tumor. This is the most common treatment.  Radiation therapy. During this treatment, high doses of X-rays are used to kill tumor cells.  Drug therapy. This involves using certain medicines to block the pituitary gland from producing too many hormones.  Follow these instructions at home:  Drink plenty of fluids.  Measure your urine output if directed to do so by your health care provider.  Do not pick your nose or remove any crusting.  Do not do any activities that require straining.  Take all medicines as directed by your health care provider.  Keep follow-up appointments as directed by your health care provider. Contact a health care provider if:  You have sudden, unusual thirst.  You are urinating frequently.  You have a headache that will not go away.  You have new vision changes.  You notice clear fluid leaking from your nose or ears, a sensation of fluid trickling down the back of your throat, or a salty taste in your mouth.  You are having trouble concentrating. Get help right away if:  Your symptoms suddenly become severe.  You have a nosebleed that does not stop  after a few minutes.  You have a fever over 101F (38.3C).  You have a severe headache or a stiff neck.  You are confused or not as alert as usual.  You have chest pain or shortness of breath. This information is not intended to replace advice given to you by your health care provider. Make sure you discuss any questions you have with your health care provider. Document Released: 07/04/2002 Document Revised:  12/20/2015 Document Reviewed: 01/14/2013 Elsevier Interactive Patient Education  2018 [employer].  Cervical Radiculopathy Cervical radiculopathy happens when a nerve in the neck (cervical nerve) is pinched or bruised. This condition can develop because of an injury or as part of the normal aging process. Pressure on the cervical nerves can cause pain or numbness that runs from the neck all the way down into the arm and fingers. Usually, this condition gets better with rest. Treatment may be needed if the condition does not improve. What are the causes? This condition may be caused by:  Injury.  Slipped (herniated) disk.  Muscle tightness in the neck because of overuse.  Arthritis.  Breakdown or degeneration in the bones and joints of the spine (spondylosis) due to aging.  Bone spurs that may develop near the cervical nerves.  What are the signs or symptoms? Symptoms of this condition include:  Pain that runs from the neck to the arm and hand. The pain can be severe or irritating. It may be worse when the neck is moved.  Numbness or weakness in the affected arm and hand.  How is this diagnosed? This condition may be diagnosed based on symptoms, medical history, and a physical exam. You may also have tests, including:  X-rays.  CT scan.  MRI.  Electromyogram (EMG).  Nerve conduction tests.  How is this treated? In many cases, treatment is not needed for this condition. With rest, the condition usually gets better over time. If treatment is needed, options may include:  Wearing a soft neck collar for short periods of time.  Physical therapy to strengthen your neck muscles.  Medicines, such as NSAIDs, oral corticosteroids, or spinal injections.  Surgery. This may be needed if other treatments do not help. Various types of surgery may be done depending on the cause of your problems.  Follow these instructions at home: Managing pain  Take over-the-counter and  prescription medicines only as told by your health care provider.  If directed, apply ice to the affected area. ? Put ice in a plastic bag. ? Place a towel between your skin and the bag. ? Leave the ice on for 20 minutes, 2-3 times per day.  If ice does not help, you can try using heat. Take a warm shower or warm bath, or use a heat pack as told by your health care provider.  Try a gentle neck and shoulder massage to help relieve symptoms. Activity  Rest as needed. Follow instructions from your health care provider about any restrictions on activities.  Do stretching and strengthening exercises as told by your health care provider or physical therapist. General instructions  If you were given a soft collar, wear it as told by your health care provider.  Use a flat pillow when you sleep.  Keep all follow-up visits as told by your health care provider. This is important. Contact a health care provider if:  Your condition does not improve with treatment. Get help right away if:  Your pain gets much worse and cannot be controlled with medicines.  You have weakness or numbness in your hand, arm, face, or leg.  You have a high fever.  You have a stiff, rigid neck.  You lose control of your bowels or your bladder (have incontinence).  You have trouble with walking, balance, or speaking. This information is not intended to replace advice given to you by your health care provider. Make sure you discuss any questions you have with your health care provider. Document Released: 04/08/2001 Document Revised: 12/20/2015 Document Reviewed: 09/07/2014 Elsevier Interactive Patient Education  [employer].

## 2017-12-18 NOTE — Progress Notes (Addendum)
Chief Complaint  Patient presents with  . Establish Care   Establish care with sister Val today  1. Pituitary adenoma saw NS Dr. Lacinda Axon who wanted to repeat MRI in 6 months from initial. She also sees Dr. Gabriel Carina who did not Rx any meds ad of yet. She reports loss of vision L eye noted [facility]. She would like to see NS in [place] due to convienence and is due to f/u with Dr. Gabriel Carina  2. She c/o urinary incontinence and unable to hold urine denies dysuria  3. Chronic neck and low back pain  -reviewed MRI C 2013 C5-C6 and C6-C7 disc and endplate degeneration.  No significant spinal stenosis, but there is neural foraminal stenosis, most pronounced at the right C6 nerve level. -reviewed MRI L Synovial cyst off the medial margin left facet at L5-S1 slightly deflects the left S1 root without compressing or displacing it. Mild to moderate right foraminal narrowing L1-2 due to disc. 4. H/o DM 2 controlled  5. H/o CAD s/p Dr. [name] primary cardiologist. BP controlled today  6. Seh also c/o leg cramps from lower legs up body she is drinking enough water.   Review of Systems  Constitutional: Positive for weight loss.  HENT: Negative for hearing loss.   Eyes:       +loss of vision left eye   Respiratory: Negative for shortness of breath.   Cardiovascular: Negative for chest pain.  Gastrointestinal: Negative for abdominal pain.  Genitourinary: Positive for frequency.  Musculoskeletal: Positive for back pain and joint pain.  Skin: Negative for rash.  Neurological: Positive for headaches.  Psychiatric/Behavioral: Negative for depression.   Past Medical History:  Diagnosis Date  . (HFpEF) heart failure with preserved ejection fraction ([place])    a. 06/2016 Echo: EF 50%, no rwma, mild to mod TR.  [name] Allergy   . Anemia   . Arthritis   . Asthma   . Bacterial vaginitis   . Blood in stool   . Brachial neuritis or radiculitis NOS   . 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 being followed by  neurosurgery/eye doctor and pcp. monitoring size.causes sinus problems  . Bronchitis   . Cardiac arrhythmia   . Cervical neck pain with evidence of disc disease    C5/6 disease, MRI done late 2012 - no records available  . Chronic pain   . Cocaine abuse, in remission ([place])    clean x 24 years  . Colon polyps   . COPD (chronic obstructive pulmonary disease) (HCC)    many inhalers  . Coronary artery disease    a. PCI of LCX 2003; b. PCI of the LAD 2012 with a (2.5 x 8 mm BMS);  c.s/p CABG 4/12:  L-LAD, S-Dx, S-OM, S-RCA (Dr. Prescott Gum);  d. 01/2012 MV: inf infarct, attenuation, no ischemia.  . Depression   . Diabetes mellitus without complication ([place])   . Family history of colon cancer   . Generalized headaches    frequent  . GERD (gastroesophageal reflux disease)   . Headache   . Heart murmur   . Hematochezia   . Hepatitis    history of hepatitis b  . History of cervical cancer    s/p cryotherapy  . History of drug abuse    cocaine, marijuana, clean since 1989  . History of hepatitis B    from eating undercooked liver  . History of kidney stones   . History of MI (myocardial infarction)   .  Hyperlipidemia   . Hypertension   . Myocardial infarction [facility]) 2003, 2012  . PAD (peripheral artery disease) (HCC)    s/p Right SFA atherectomy and PTA 01/15/11, normal ABIs 01/2011  . Polyp of colon   . Seasonal allergies   . Sleep apnea    uses cpap  . Smoking history    quit 07/2010  . Systolic CHF, chronic (HCC)    mild: echo 08/2010 - mildly reduced EF 40-45%, mild diffuse hypokinesis  . Thyroid disease   . Urinary incontinence   . Urine incontinence    Past Surgical History:  Procedure Laterality Date  . ABDOMINAL HYSTERECTOMY     2003  . CHOLECYSTECTOMY  1986  . COLONOSCOPY  2008   3 polyps  . COLONOSCOPY WITH PROPOFOL N/A 02/06/2015   Procedure: COLONOSCOPY WITH PROPOFOL;  Surgeon: Lucilla Lame, MD;  Location: [facility] ENDOSCOPY;  Service: Endoscopy;  Laterality: N/A;  .  COLONOSCOPY WITH PROPOFOL N/A 01/27/2017   Procedure: COLONOSCOPY WITH PROPOFOL;  Surgeon: Lucilla Lame, MD;  Location: [facility] ENDOSCOPY;  Service: Endoscopy;  Laterality: N/A;  . CORONARY ANGIOPLASTY     w/ stent placement x2  . CORONARY ARTERY BYPASS GRAFT  2012   Dr [name]  . CORONARY STENT PLACEMENT  2003   S/P MI  . CORONARY STENT PLACEMENT  2007   [place]  . ESOPHAGOGASTRODUODENOSCOPY (EGD) WITH PROPOFOL N/A 02/06/2015   Procedure: ESOPHAGOGASTRODUODENOSCOPY (EGD) WITH PROPOFOL;  Surgeon: Lucilla Lame, MD;  Location: [facility] ENDOSCOPY;  Service: Endoscopy;  Laterality: N/A;  . ESOPHAGOGASTRODUODENOSCOPY (EGD) WITH PROPOFOL N/A 01/27/2017   Procedure: ESOPHAGOGASTRODUODENOSCOPY (EGD) WITH PROPOFOL;  Surgeon: Lucilla Lame, MD;  Location: [facility] ENDOSCOPY;  Service: Endoscopy;  Laterality: N/A;  . FEMORAL ARTERY STENT  10/2010   right sided (Dr. Burt Knack)  . KNEE ARTHROSCOPY WITH MEDIAL MENISECTOMY Right 08/20/2017   Procedure: KNEE ARTHROSCOPY WITH MEDIAL  AND LATERAL MENISECTOMY;  Surgeon: Hessie Knows, MD;  Location: [facility] ORS;  Service: Orthopedics;  Laterality: Right;  . TUBAL LIGATION     Family History  Problem Relation Age of Onset  . Hypertension Father   . Heart failure Father   . Diabetes Father   . Colon cancer Father 63  . Glaucoma Father   . Cancer Father   . Heart disease Father   . Breast cancer Mother 26       breast cancer, late 63's  . Cancer Mother   . Brain cancer Sister   . Arthritis Sister   . Diabetes Sister   . Hypertension Sister   . Kidney disease Sister   . Diabetes Brother   . Hypertension Brother   . Coronary artery disease Neg Hx   . Stroke Neg Hx    Social History   Socioeconomic History  . Marital status: Legally Separated    Spouse name: Not on file  . Number of children: Not on file  . Years of education: Not on file  . Highest education level: Not on file  Occupational History  . Not on file  Social Needs  . Financial resource strain: Not on  file  . Food insecurity:    Worry: Not on file    Inability: Not on file  . Transportation needs:    Medical: Not on file    Non-medical: Not on file  Tobacco Use  . Smoking status: Former Smoker    Packs/day: 1.00    Years: 38.00    Pack years: 38.00    Types: Cigarettes  Last attempt to quit: 08/12/2010    Years since quitting: 7.3  . Smokeless tobacco: Never Used  Substance and Sexual Activity  . Alcohol use: No  . Drug use: No    Types: Cocaine    Comment: Remote Hx (crack cocaine and marijuana)  . Sexual activity: Yes  Lifestyle  . Physical activity:    Days per week: Not on file    Minutes per session: Not on file  . Stress: Not on file  Relationships  . Social connections:    Talks on phone: Not on file    Gets together: Not on file    Attends religious service: Not on file    Active member of club or organization: Not on file    Attends meetings of clubs or organizations: Not on file    Relationship status: Not on file  . Intimate partner violence:    Fear of current or ex partner: Not on file    Emotionally abused: Not on file    Physically abused: Not on file    Forced sexual activity: Not on file  Other Topics Concern  . 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 acetaminophen (TYLENOL) 500 MG tablet Take 1,000 mg by mouth every 6 (six) hours as needed for mild pain or headache.  . albuterol (PROVENTIL HFA;VENTOLIN HFA) 108 (90 Base) MCG/ACT inhaler Inhale 2 puffs into the lungs every 6 (six) hours as needed for wheezing or shortness of breath.  [name] aspirin 81 MG tablet Take 1 tablet (81 mg total) by mouth daily.   . Azelastine HCl 0.15 % SOLN Place 2 sprays into both nostrils daily as needed (allergies).   . cholecalciferol (VITAMIN D) 1000 units tablet Take 1,000 Units by mouth daily.  . cyclobenzaprine (FLEXERIL) 5 MG tablet Take 1-2 tablets 3 times daily as needed  . esomeprazole (NEXIUM) 40 MG capsule Take 40 mg by mouth daily. Reported on 09/04/2015  . ezetimibe (ZETIA) 10 MG tablet TAKE ONE TABLET BY MOUTH ONCE DAILY  . fluticasone (FLONASE) 50 MCG/ACT nasal spray Place 2 sprays into both nostrils daily as needed for allergies.   . furosemide (LASIX) 20 MG tablet TAKE ONE TABLET BY MOUTH ONCE DAILY FOR SWELLING. MAY TAKE ONE ADDITIONAL TABLET AS NEEDED FOR SWELLING OR FLUID (Patient taking differently: TAKE ONE TABLET BY MOUTH ONCE DAILY AND ANOTHER DOSE IF NEEDED FOR FLUID BUILD UP)  . isosorbide mononitrate (IMDUR) 30 MG 24 hr tablet TAKE ONE TABLET BY MOUTH TWICE DAILY  . KLOR-CON M10 10 MEQ tablet TAKE ONE TABLET BY MOUTH ONCE DAILY  . latanoprost (XALATAN) 0.005 % ophthalmic solution Place 1 drop into both eyes at bedtime.  . lidocaine (LIDODERM) 5 % Place 1 patch onto the skin every 12 (twelve) hours. Remove & Discard patch within 12 hours or as directed by MD  . lisinopril (PRINIVIL,ZESTRIL) 5 MG tablet Take 1 tablet (5 mg total) by mouth daily.  . metoprolol tartrate (LOPRESSOR) 25 MG tablet TAKE ONE TABLET BY MOUTH TWICE DAILY (Patient taking differently: Take 25 mg by  mouth 2 (two) times daily. )  . nitroGLYCERIN (NITROSTAT) 0.4 MG SL tablet Place 1 tablet (0.4 mg total) under the tongue every 5 (five) minutes as needed. (Patient taking differently: Place 0.4 mg under the tongue every 5 (five) minutes as needed for chest pain. )  . PARoxetine (PAXIL) 10 MG tablet Take 10 mg by mouth daily.  [name] PRENATAL VIT-FE FUM-FE BISG-FA PO Take by mouth.  . rivaroxaban (XARELTO) 2.5 MG TABS tablet Take 1 tablet (2.5 mg total) by mouth 2 (two) times daily.  . rosuvastatin (CRESTOR) 20 MG tablet Take 20 mg by  mouth daily.   . traMADol (ULTRAM) 50 MG tablet Take 1 tablet (50 mg total) by mouth every 6 (six) hours as needed.  . umeclidinium-vilanterol (ANORO ELLIPTA) 62.5-25 MCG/INH AEPB Inhale 1 puff into the lungs daily.   Allergies  Allergen Reactions  . Latex Rash       . Shellfish Allergy Anaphylaxis    Hard shellfish/swelling in throat  . Sulfonamide Derivatives Anaphylaxis    Swelling in throat.  . Watermelon [Citrullus Vulgaris] Other (See Comments)    Throat itchy  . Other Swelling   No results found for this or any previous visit (from the past 2160 hour(s)). Objective  Body mass index is 34.38 kg/m. Wt Readings from Last 3 Encounters:  12/18/17 206 lb 9.6 oz (93.7 kg)  12/13/17 208 lb (94.3 kg)  10/15/17 212 lb 12 oz (96.5 kg)   Temp Readings from Last 3 Encounters:  12/18/17 98.2 F (36.8 C) (Oral)  12/13/17 98.1 F (36.7 C) (Oral)  09/23/17 97.7 F (36.5 C) (Oral)   BP Readings from Last 3 Encounters:  12/18/17 90/62  12/13/17 108/75  10/15/17 102/60   Pulse Readings from Last 3 Encounters:  12/18/17 61  12/13/17 63  10/15/17 61    Physical Exam  Constitutional: She is oriented to person, place, and time. Vital signs are normal. She appears well-developed and well-nourished. She is cooperative.  HENT:  Head: Normocephalic and atraumatic.  Mouth/Throat: Oropharynx is clear and moist and mucous membranes are normal.  Eyes: Pupils are equal, round, and reactive to light. Conjunctivae are normal.  Cardiovascular: Normal rate, regular rhythm and normal heart sounds.  Pulmonary/Chest: Effort normal and breath sounds normal.  Musculoskeletal: She exhibits tenderness.       Lumbar back: She exhibits tenderness.  Neurological: She is alert and oriented to person, place, and time. Gait normal.  Skin: Skin is warm, dry and intact.  Psychiatric: She has a normal mood and affect. Her speech is normal and behavior is normal. Judgment and thought content normal.  Cognition and memory are normal.  Nursing note and vitals reviewed.   Assessment   1. 1.3 cm Pituitary adenoma with loss of vision in left eye  2. H/o DM 2  3. CAD s/p CABG, chronic systolic CHF 0/93/23 EF 55-73% grade 1 DD 4. Lumbar radiculopathy and severe DDD, chronic neck pain 2013 MRI with neural foraminal stenosis right C6 worst 5. Urinary incontinence  6. HM 7. Leg cramps   Plan  1. Will disc with Dr. Gabriel Carina plan of care and get f/u  Disc with Dr. Gabriel Carina she will also request [facility] records and sch pt f/u  Refer to [facility] NS in [place] to address #1 and #4  -[place] will try to get sch Dr. Charlies Constable NS [facility] for #1 and #4 he comes to [facility] in [place]  Will need to get records [facility] at  f/u  Repeat MRI brain already ordered Dr. Lacinda Axon 01/08/18 2. Cont meds trying to get med list from [place] 3. Cont cards f/u  4. Refer [facility] in [place] -see #1 5. Trial of oxybutynin  6.  Had flu shot  Check other vaccines in my notes [facility]  S/p hysterectomy no cervix and f/u OB/GYN  Colonoscopy Dr. Allen Norris 01/2017 polyps, gastritis  Mammogram had 09/29/17 uni right neg   Cards Dr. Trula Ore [facility]  NS Dr. Malka So  Dentist [facility]  Endocrine Dr. Gabriel Carina  7. Monitor  Will get labs [facility]  This is my established pt from [facility]  Provider: Dr. Olivia Mackie [name]

## 2017-12-22 ENCOUNTER — Telehealth: Payer: Self-pay | Admitting: Internal Medicine

## 2017-12-22 ENCOUNTER — Encounter: Payer: Self-pay | Admitting: Internal Medicine

## 2017-12-22 NOTE — Telephone Encounter (Signed)
Please refer to Dr. Lacinda Axon Neurosurgery at [facility] but comes to [facility] Endocrine office for chronic neck and low back pain and pituitary adenoma Make sure appt is in [place] and he address neck and back issues see MRIs lumbar trying to sch MRI cervical   TMS

## 2017-12-22 NOTE — Telephone Encounter (Signed)
Please call pt speak to pt or sister  1. I spoke with Dr. Gabriel Carina her office willl be calling you to sch appt  2. Trying to get C spine MRI sch on 12/31/17 Dr. Lacinda Axon neurosurgery already ordered MRI brain for 12/31/17 keep this appt  3. Dr. Lacinda Axon comes to [facility] endocrine office  I want you to stay with seeing him due to fact you are already established and he comes to [facility]  He can see you for your back and brain tumor   [name]

## 2017-12-22 NOTE — Telephone Encounter (Signed)
Please call pt speak to pt or sister  1. I spoke with Dr. Gabriel Carina her office willl be calling you to sch appt  2. Trying to get C spine MRI sch on 12/31/17 Dr. Lacinda Axon neurosurgery already ordered MRI brain for 12/31/17 keep this appt  3. Dr. Lacinda Axon comes to [facility] endocrine office  I want you to stay with seeing him due to fact you are already established and he comes to [facility]  He can see you for your back and brain tumor   [place]

## 2017-12-23 ENCOUNTER — Ambulatory Visit: Payer: Self-pay

## 2017-12-23 ENCOUNTER — Telehealth: Payer: Self-pay

## 2017-12-23 ENCOUNTER — Telehealth: Payer: Self-pay | Admitting: Internal Medicine

## 2017-12-23 NOTE — Telephone Encounter (Signed)
Pt calling for right arm pain that starts at the axilla and radiates down the right arm. Pt is also experiencing muscle spasms. Pt stated that there is swelling under the right axilla. Pain is moderate in intensity. Pt thinks it is from a pinched nerve. Pt also requesting Flexeril. Pt was prescribed Flexaril 12/13/17 #20 tablets no RF. Pt had been seen in the ED for right sided neck pain on 12/13/17. Pt has been taking Tylenol for the pain. Pt is having weakness to the right arm. Care advice given per protocol. Appt given for tomorrow.   Reason for Disposition . [1] MODERATE pain (e.g., interferes with normal activities) AND [2] present > 3 days  Answer Assessment - Initial Assessment Questions 1. ONSET: "When did the pain start?"     12/13/17 2. LOCATION: "Where is the pain located?"     Right arm 3. PAIN: "How bad is the pain?" (Scale 1-10; or mild, moderate, severe)   - MILD (1-3): doesn't interfere with normal activities   - MODERATE (4-7): interferes with normal activities (e.g., work or school) or awakens from sleep   - SEVERE (8-10): excruciating pain, unable to do any normal activities, unable to hold a cup of water     moderate 4. WORK OR EXERCISE: "Has there been any recent work or exercise that involved this part of the body?" no 5. CAUSE: "What do you think is causing the arm pain?"     Pinched nerve 6. OTHER SYMPTOMS: "Do you have any other symptoms?" (e.g., neck pain, swelling, rash, fever, numbness, weakness)     Swelling under arm, weakness 7. PREGNANCY: "Is there any chance you are pregnant?" "When was your last menstrual period?"     n/a  Protocols used: ARM PAIN-A-AH

## 2017-12-23 NOTE — Telephone Encounter (Signed)
FYI; please see med request.

## 2017-12-23 NOTE — Telephone Encounter (Unsigned)
Copied from [place] [phone]. Topic: Quick Communication - Rx Refill/Question >> Dec 23, 2017 10:20 AM Neva Seat wrote: cyclobenzaprine (FLEXERIL) 5 MG tablet  Pt is having severe pain in arm and is out of muscle relaxers.  Pt states this Rx has helped in the past.   [facility] (N), [place] [address] [place] ([place]) [place] [address] Phone: [phone] Fax: [phone]

## 2017-12-23 NOTE — Telephone Encounter (Signed)
Left VM for pt to call back re: severe arm pain. Flexeril was ordered in ED for neck strain 12/13/17.

## 2017-12-23 NOTE — Telephone Encounter (Signed)
Copied from [place] [id number]. Topic: Referral - Request >> Dec 23, 2017 10:24 AM Neva Seat wrote: Pt is asking for a Orthopedic Dr referral.

## 2017-12-23 NOTE — Telephone Encounter (Signed)
Patient has been triaged by [facility] Nurse, please advise

## 2017-12-23 NOTE — Telephone Encounter (Signed)
mychart message sent

## 2017-12-23 NOTE — Telephone Encounter (Signed)
FYI

## 2017-12-23 NOTE — Telephone Encounter (Signed)
Please advise 

## 2017-12-24 ENCOUNTER — Ambulatory Visit: Payer: Medicare HMO | Admitting: Family

## 2017-12-24 ENCOUNTER — Other Ambulatory Visit: Payer: Self-pay | Admitting: Internal Medicine

## 2017-12-24 DIAGNOSIS — G8929 Other chronic pain: Secondary | ICD-10-CM

## 2017-12-24 DIAGNOSIS — M545 Low back pain: Principal | ICD-10-CM

## 2017-12-24 MED ORDER — CYCLOBENZAPRINE HCL 5 MG PO TABS: 5.0000 mg | ORAL_TABLET | Freq: Two times a day (BID) | ORAL | 2 refills | Status: DC | PRN

## 2017-12-24 NOTE — Telephone Encounter (Signed)
We are in the process of getting MRI of her cervical spine scheduled  Sent flexeril    [place]

## 2017-12-24 NOTE — Telephone Encounter (Signed)
Please advise 

## 2017-12-24 NOTE — Telephone Encounter (Signed)
Which ortho body part is she needing neck/ back or knee?  If neck or back I want her to see her neurosurgeon Dr. Lacinda Axon with [facility] as he comes to [facility] and they should be calling her about an appt   [place]

## 2017-12-24 NOTE — Telephone Encounter (Signed)
Spoke with patient still having neck pain feels like pain radiating circling into armpit running down inside of armpit feels tugging sensation goes down arm.   Advised patient that she should be hearing from Dr Jonathon Jordan office with [facility] about an appointment.    Will cancel appointment with Lenna Sciara , NP today.

## 2017-12-25 ENCOUNTER — Telehealth: Payer: Self-pay | Admitting: Internal Medicine

## 2017-12-25 NOTE — Telephone Encounter (Signed)
Patient notified and voiced understanding.

## 2017-12-25 NOTE — Telephone Encounter (Signed)
Reviewed faxed note Dr. Gabriel Carina   She contacted [facility] Dr. Lu Duffel pt had visual field test with very poor performance on left, however he felt was performance issue and could not be reliably interpreted. Central vision was perfect and he could not interpret there peripheral visual field testing MRI pit sch 12/31/17 and f/u NS Dr. Anda Latina clinic 01/08/18 9 am and pt is aware.   [place]

## 2017-12-29 ENCOUNTER — Telehealth: Payer: Self-pay | Admitting: Internal Medicine

## 2017-12-29 NOTE — Telephone Encounter (Signed)
ROI received from [place] on 12/29/2017.

## 2017-12-31 ENCOUNTER — Other Ambulatory Visit
Admission: RE | Admit: 2017-12-31 | Discharge: 2017-12-31 | Disposition: A | Discharge status: Home / Self Care | Payer: Medicare HMO | Source: Ambulatory Visit | Attending: Neurosurgery | Admitting: Neurosurgery

## 2017-12-31 ENCOUNTER — Telehealth: Payer: Self-pay

## 2017-12-31 DIAGNOSIS — D352 Benign neoplasm of pituitary gland: Secondary | ICD-10-CM | POA: Insufficient documentation

## 2017-12-31 DIAGNOSIS — H539 Unspecified visual disturbance: Secondary | ICD-10-CM | POA: Diagnosis not present

## 2017-12-31 LAB — POCT I-STAT CREATININE: Creatinine, Ser: 1 mg/dL (ref 0.44–1.00)

## 2017-12-31 MED ORDER — GADOBENATE DIMEGLUMINE 529 MG/ML IV SOLN
10.0000 mL | Freq: Once | INTRAVENOUS | Status: AC | PRN
  Administered 2017-12-31: 10 mL via INTRAVENOUS

## 2017-12-31 NOTE — Telephone Encounter (Signed)
Copied from [place] [id number]. Topic: Referral - Question >> Dec 31, 2017 12:18 PM Synthia Innocent wrote: Reason for CRM: [employer] calling regarding MRI C Spine. ref #[id number]

## 2018-01-01 NOTE — Telephone Encounter (Signed)
Spoken to the insurance. The MRI is being denied. Provider can do peer to peer to appeal.

## 2018-01-06 ENCOUNTER — Telehealth: Payer: Self-pay | Admitting: Internal Medicine

## 2018-01-06 NOTE — Telephone Encounter (Signed)
Please advise 

## 2018-01-06 NOTE — Telephone Encounter (Signed)
It looks like another provider prescribed this medication. Please advise. Patient does have an appt with Dr. Aundra Dubin on 01-21-18.

## 2018-01-06 NOTE — Telephone Encounter (Signed)
Copied from [place] [phone]. Topic: Quick Communication - See Telephone Encounter >> Jan 06, 2018 10:48 AM Nils Flack wrote: CRM for notification. See Telephone encounter for: 01/06/18. Pt is asking for tramadol for her pain, she is staking tylenol and it is making her stomach raw.  She says pain is constant.  [facility] [address]

## 2018-01-08 ENCOUNTER — Other Ambulatory Visit: Payer: Self-pay | Admitting: Internal Medicine

## 2018-01-08 DIAGNOSIS — M545 Low back pain: Secondary | ICD-10-CM

## 2018-01-08 DIAGNOSIS — G8929 Other chronic pain: Secondary | ICD-10-CM

## 2018-01-08 DIAGNOSIS — E237 Disorder of pituitary gland, unspecified: Secondary | ICD-10-CM | POA: Diagnosis not present

## 2018-01-08 DIAGNOSIS — M542 Cervicalgia: Principal | ICD-10-CM

## 2018-01-08 MED ORDER — TRAMADOL HCL 50 MG PO TABS: 50.0000 mg | ORAL_TABLET | Freq: Two times a day (BID) | ORAL | 0 refills | Status: DC | PRN

## 2018-01-16 ENCOUNTER — Ambulatory Visit
Admission: RE | Admit: 2018-01-16 | Discharge: 2018-01-16 | Disposition: A | Discharge status: Home / Self Care | Payer: Medicare HMO | Source: Ambulatory Visit | Attending: Internal Medicine | Admitting: Internal Medicine

## 2018-01-16 DIAGNOSIS — M4802 Spinal stenosis, cervical region: Secondary | ICD-10-CM | POA: Diagnosis not present

## 2018-01-16 DIAGNOSIS — G8929 Other chronic pain: Secondary | ICD-10-CM | POA: Diagnosis not present

## 2018-01-16 DIAGNOSIS — M50322 Other cervical disc degeneration at C5-C6 level: Secondary | ICD-10-CM | POA: Diagnosis not present

## 2018-01-16 DIAGNOSIS — M542 Cervicalgia: Secondary | ICD-10-CM | POA: Diagnosis not present

## 2018-01-21 ENCOUNTER — Ambulatory Visit (INDEPENDENT_AMBULATORY_CARE_PROVIDER_SITE_OTHER): Payer: Medicare HMO | Admitting: Internal Medicine

## 2018-01-21 ENCOUNTER — Encounter: Payer: Self-pay | Admitting: Internal Medicine

## 2018-01-21 VITALS — BP 110/78 | HR 80 | Temp 98.1°F | Ht 65.0 in | Wt 210.0 lb

## 2018-01-21 DIAGNOSIS — M542 Cervicalgia: Secondary | ICD-10-CM | POA: Diagnosis not present

## 2018-01-21 DIAGNOSIS — R32 Unspecified urinary incontinence: Secondary | ICD-10-CM | POA: Diagnosis not present

## 2018-01-21 DIAGNOSIS — M5416 Radiculopathy, lumbar region: Secondary | ICD-10-CM

## 2018-01-21 MED ORDER — METHYLPREDNISOLONE ACETATE 40 MG/ML IJ SUSP
40.0000 mg | Freq: Once | INTRAMUSCULAR | Status: AC
  Administered 2018-01-21: 40 mg via INTRAMUSCULAR

## 2018-01-21 MED ORDER — OXYCODONE-ACETAMINOPHEN 5-325 MG PO TABS: 1.0000 | ORAL_TABLET | Freq: Three times a day (TID) | ORAL | 0 refills | Status: DC | PRN

## 2018-01-21 NOTE — Patient Instructions (Addendum)
F/u in 1 month  We are trying to get in with the pain clinic Dr. Sharlet Salina or Cari Caraway and referral to neurosurgery Dr. Deetta Perla   Cervical Radiculopathy Cervical radiculopathy happens when a nerve in the neck (cervical nerve) is pinched or bruised. This condition can develop because of an injury or as part of the normal aging process. Pressure on the cervical nerves can cause pain or numbness that runs from the neck all the way down into the arm and fingers. Usually, this condition gets better with rest. Treatment may be needed if the condition does not improve. What are the causes? This condition may be caused by:  Injury.  Slipped (herniated) disk.  Muscle tightness in the neck because of overuse.  Arthritis.  Breakdown or degeneration in the bones and joints of the spine (spondylosis) due to aging.  Bone spurs that may develop near the cervical nerves.  What are the signs or symptoms? Symptoms of this condition include:  Pain that runs from the neck to the arm and hand. The pain can be severe or irritating. It may be worse when the neck is moved.  Numbness or weakness in the affected arm and hand.  How is this diagnosed? This condition may be diagnosed based on symptoms, medical history, and a physical exam. You may also have tests, including:  X-rays.  CT scan.  MRI.  Electromyogram (EMG).  Nerve conduction tests.  How is this treated? In many cases, treatment is not needed for this condition. With rest, the condition usually gets better over time. If treatment is needed, options may include:  Wearing a soft neck collar for short periods of time.  Physical therapy to strengthen your neck muscles.  Medicines, such as NSAIDs, oral corticosteroids, or spinal injections.  Surgery. This may be needed if other treatments do not help. Various types of surgery may be done depending on the cause of your problems.  Follow these instructions at home: Managing  pain  Take over-the-counter and prescription medicines only as told by your health care provider.  If directed, apply ice to the affected area. ? Put ice in a plastic bag. ? Place a towel between your skin and the bag. ? Leave the ice on for 20 minutes, 2-3 times per day.  If ice does not help, you can try using heat. Take a warm shower or warm bath, or use a heat pack as told by your health care provider.  Try a gentle neck and shoulder massage to help relieve symptoms. Activity  Rest as needed. Follow instructions from your health care provider about any restrictions on activities.  Do stretching and strengthening exercises as told by your health care provider or physical therapist. General instructions  If you were given a soft collar, wear it as told by your health care provider.  Use a flat pillow when you sleep.  Keep all follow-up visits as told by your health care provider. This is important. Contact a health care provider if:  Your condition does not improve with treatment. Get help right away if:  Your pain gets much worse and cannot be controlled with medicines.  You have weakness or numbness in your hand, arm, face, or leg.  You have a high fever.  You have a stiff, rigid neck.  You lose control of your bowels or your bladder (have incontinence).  You have trouble with walking, balance, or speaking. This information is not intended to replace advice given to you by your health  care provider. Make sure you discuss any questions you have with your health care provider. Document Released: 04/08/2001 Document Revised: 12/20/2015 Document Reviewed: 09/07/2014 Elsevier Interactive Patient Education  [employer].

## 2018-01-21 NOTE — Progress Notes (Addendum)
Chief Complaint  Patient presents with  . Follow-up   F/u  1. Cervical radiculopathy 100/10 pain scale and rad right arm with numbness/tingling tried gabapentin in the past w/o relief and also tylenol, aleve not helping nor tramadol   C MRI 01/16/18 abnormal 1. Progressive disc degeneration at C5-6 and C6-7 resulting in increased neural foraminal stenosis, moderate at C5-6. 2. Mild spinal stenosis at C5-6. 3. Progressive, severe right facet arthritis at C7-T1 with associated edema and mild right neural foraminal stenosis.  2. 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 Gabriel Carina 01/27/18 and I am wondering if medications need to be started to try to shrink tumor she also had f/u with Dr. Remo Lipps cool   3. Right knee with MRI 08/2016 with medial tear, degnerative change had arthroscopy still with pain and swelling seeing ortho [place] upcoming   4. Overactive bladder oxybutynin helping   Review of Systems  Constitutional: Negative for weight loss.  Eyes:       Vision problem left eye   Respiratory: Negative for shortness of breath.   Cardiovascular: Negative for chest pain.  Genitourinary: Negative for frequency.  Musculoskeletal: Positive for back pain, joint pain and neck pain.  Skin: Negative for rash.  Neurological: Negative for headaches.  Psychiatric/Behavioral: Negative for depression.   Past Medical History:  Diagnosis Date  . (HFpEF) heart failure with preserved ejection fraction ([place])    a. 06/2016 Echo: EF 50%, no rwma, mild to mod TR.  [name] Allergy   . Anemia   . Arthritis   . Asthma   . Bacterial vaginitis   . Blood in stool   . Brachial neuritis or radiculitis NOS   . 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 being followed by neurosurgery/eye doctor and pcp. monitoring size.causes sinus problems  . Bronchitis   . Cardiac arrhythmia   . Cervical neck pain with evidence of disc disease    C5/6 disease, MRI  done late 2012 - no records available  . Chronic pain   . Cocaine abuse, in remission ([place])    clean x 24 years  . Colon polyps   . COPD (chronic obstructive pulmonary disease) (HCC)    many inhalers  . Coronary artery disease    a. PCI of LCX 2003; b. PCI of the LAD 2012 with a (2.5 x 8 mm BMS);  c.s/p CABG 4/12:  L-LAD, S-Dx, S-OM, S-RCA (Dr. Prescott Gum);  d. 01/2012 MV: inf infarct, attenuation, no ischemia.  . Depression   . Diabetes mellitus without complication ([place])   . Family history of colon cancer   . Generalized headaches    frequent  . GERD (gastroesophageal reflux disease)   . Headache   . Heart murmur   . Hematochezia   . Hepatitis    history of hepatitis b  . History of cervical cancer    s/p cryotherapy  . History of drug abuse    cocaine, marijuana, clean since 1989  . History of hepatitis B    from eating undercooked liver  . History of kidney stones   . History of MI (myocardial infarction)   . Hyperlipidemia   . Hypertension   . Myocardial infarction [facility]) 2003, 2012  . PAD (peripheral artery disease) (HCC)    s/p Right SFA atherectomy and PTA 01/15/11, normal ABIs 01/2011  . Polyp of colon   . Seasonal allergies   . Sleep apnea  uses cpap  . Smoking history    quit 07/2010  . Systolic CHF, chronic (HCC)    mild: echo 08/2010 - mildly reduced EF 40-45%, mild diffuse hypokinesis  . Thyroid disease   . Urinary incontinence   . Urine incontinence    Past Surgical History:  Procedure Laterality Date  . ABDOMINAL HYSTERECTOMY     2003  . CHOLECYSTECTOMY  1986  . COLONOSCOPY  2008   3 polyps  . COLONOSCOPY WITH PROPOFOL N/A 02/06/2015   Procedure: COLONOSCOPY WITH PROPOFOL;  Surgeon: Lucilla Lame, MD;  Location: [facility] ENDOSCOPY;  Service: Endoscopy;  Laterality: N/A;  . COLONOSCOPY WITH PROPOFOL N/A 01/27/2017   Procedure: COLONOSCOPY WITH PROPOFOL;  Surgeon: Lucilla Lame, MD;  Location[facility] ENDOSCOPY;  Service: Endoscopy;  Laterality: N/A;  . CORONARY  ANGIOPLASTY     w/ stent placement x2  . CORONARY ARTERY BYPASS GRAFT  2012   Dr [name]  . CORONARY STENT PLACEMENT  2003   S/P MI  . CORONARY STENT PLACEMENT  2007   [place]  . ESOPHAGOGASTRODUODENOSCOPY (EGD) WITH PROPOFOL N/A 02/06/2015   Procedure: ESOPHAGOGASTRODUODENOSCOPY (EGD) WITH PROPOFOL;  Surgeon: Lucilla Lame, MD;  Location: [facility] ENDOSCOPY;  Service: Endoscopy;  Laterality: N/A;  . ESOPHAGOGASTRODUODENOSCOPY (EGD) WITH PROPOFOL N/A 01/27/2017   Procedure: ESOPHAGOGASTRODUODENOSCOPY (EGD) WITH PROPOFOL;  Surgeon: Lucilla Lame, MD;  Location: [facility] ENDOSCOPY;  Service: Endoscopy;  Laterality: N/A;  . FEMORAL ARTERY STENT  10/2010   right sided (Dr. Burt Knack)  . KNEE ARTHROSCOPY WITH MEDIAL MENISECTOMY Right 08/20/2017   Procedure: KNEE ARTHROSCOPY WITH MEDIAL  AND LATERAL MENISECTOMY;  Surgeon: Hessie Knows, MD;  Location: [facility] ORS;  Service: Orthopedics;  Laterality: Right;  . TUBAL LIGATION     Family History  Problem Relation Age of Onset  . Hypertension Father   . Heart failure Father   . Diabetes Father   . Colon cancer Father 72  . Glaucoma Father   . Cancer Father   . Heart disease Father   . Breast cancer Mother 83       breast cancer, late 9's  . Cancer Mother   . Brain cancer Sister   . Arthritis Sister   . Diabetes Sister   . Hypertension Sister   . Kidney disease Sister   . Diabetes Brother   . Hypertension Brother   . Coronary artery disease Neg Hx   . Stroke Neg Hx    Social History   Socioeconomic History  . Marital status: Legally Separated    Spouse name: Not on file  . Number of children: Not on file  . Years of education: Not on file  . Highest education level: Not on file  Occupational History  . Not on file  Social Needs  . Financial resource strain: Not on file  . Food insecurity:    Worry: Not on file    Inability: Not on file  . Transportation needs:    Medical: Not on file    Non-medical: Not on file  Tobacco Use  . Smoking  status: Former Smoker    Packs/day: 1.00    Years: 38.00    Pack years: 38.00    Types: Cigarettes    Last attempt to quit: 08/12/2010    Years since quitting: 7.4  . Smokeless tobacco: Never Used  Substance and Sexual Activity  . Alcohol use: No  . Drug use: No    Types: Cocaine    Comment: Remote Hx (crack cocaine and marijuana)  . Sexual  activity: Yes  Lifestyle  . Physical activity:    Days per week: Not on file    Minutes per session: Not on file  . Stress: Not on file  Relationships  . Social connections:    Talks on phone: Not on file    Gets together: Not on file    Attends religious service: Not on file    Active member of club or organization: Not on file    Attends meetings of clubs or organizations: Not on file    Relationship status: Not on file  . Intimate partner violence:    Fear of current or ex partner: Not on file    Emotionally abused: Not on file    Physically abused: Not on file    Forced sexual activity: Not on file  Other Topics Concern  . 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 acetaminophen (TYLENOL) 500 MG tablet Take 1,000 mg by mouth every 6 (six) hours as needed for mild pain or headache.  . albuterol (PROVENTIL HFA;VENTOLIN HFA) 108 (90 Base) MCG/ACT inhaler Inhale 2 puffs into the lungs every 6 (six) hours as needed for wheezing or shortness of breath.  [name] aspirin 81 MG tablet Take 1 tablet (81 mg total) by mouth daily.  . Azelastine HCl 0.15 % SOLN Place 2 sprays into both nostrils daily as needed (allergies).   . cyclobenzaprine (FLEXERIL) 5 MG tablet Take 1-2 tablets  (5-10 mg total) by mouth 2 (two) times daily as needed for muscle spasms. Take 1-2 tablets 3 times daily as needed  . esomeprazole (NEXIUM) 40 MG capsule Take 40 mg by mouth daily. Reported on 09/04/2015  . ezetimibe (ZETIA) 10 MG tablet TAKE ONE TABLET BY MOUTH ONCE DAILY  . fluticasone (FLONASE) 50 MCG/ACT nasal spray Place 2 sprays into both nostrils daily as needed for allergies.   . furosemide (LASIX) 20 MG tablet TAKE ONE TABLET BY MOUTH ONCE DAILY FOR SWELLING. MAY TAKE ONE ADDITIONAL TABLET AS NEEDED FOR SWELLING OR FLUID (Patient taking differently: TAKE ONE TABLET BY MOUTH ONCE DAILY AND ANOTHER DOSE IF NEEDED FOR FLUID BUILD UP)  . isosorbide mononitrate (IMDUR) 30 MG 24 hr tablet TAKE ONE TABLET BY MOUTH TWICE DAILY  . KLOR-CON M10 10 MEQ tablet TAKE ONE TABLET BY MOUTH ONCE DAILY  . latanoprost (XALATAN) 0.005 % ophthalmic solution Place 1 drop into both eyes at bedtime.  [name] lisinopril (PRINIVIL,ZESTRIL) 5 MG tablet Take 1 tablet (5 mg total) by mouth daily.  . metoprolol tartrate (LOPRESSOR) 25 MG tablet TAKE ONE TABLET BY MOUTH TWICE DAILY (Patient taking differently: Take 25 mg by mouth 2 (two) times daily. )  . nitroGLYCERIN (NITROSTAT) 0.4 MG SL tablet Place 1 tablet (0.4 mg total) under the tongue every 5 (five) minutes as needed. (Patient taking differently: Place 0.4 mg under the tongue every 5 (five) minutes as needed for chest pain. )  . oxybutynin (DITROPAN-XL) 5 MG 24 hr tablet Take 1 tablet (5 mg total) by mouth at bedtime.  [name]  PARoxetine (PAXIL) 10 MG tablet Take 10 mg by mouth daily.  [name] PRENATAL VIT-FE FUM-FE BISG-FA PO Take by mouth.  . rivaroxaban (XARELTO) 2.5 MG TABS tablet Take 1 tablet (2.5 mg total) by mouth 2 (two) times daily.  . rosuvastatin (CRESTOR) 20 MG tablet Take 20 mg by mouth daily.   [name] umeclidinium-vilanterol (ANORO ELLIPTA) 62.5-25 MCG/INH AEPB Inhale 1 puff into the lungs daily.  . [DISCONTINUED] cholecalciferol (VITAMIN D) 1000 units tablet Take 1,000  Units by mouth daily.   Allergies  Allergen Reactions  . Latex Rash       . Shellfish Allergy Anaphylaxis    Hard shellfish/swelling in throat  . Sulfonamide Derivatives Anaphylaxis    Swelling in throat.  . Watermelon [Citrullus Vulgaris] Other (See Comments)    Throat itchy  . Other Swelling   Recent Results (from the past 2160 hour(s))  I-STAT creatinine     Status: None   Collection Time: 12/31/17  7:00 PM  Result Value Ref Range   Creatinine, Ser 1.00 0.44 - 1.00 mg/dL   Objective  Body mass index is 34.95 kg/m. Wt Readings from Last 3 Encounters:  01/21/18 210 lb (95.3 kg)  12/18/17 206 lb 9.6 oz (93.7 kg)  12/13/17 208 lb (94.3 kg)   Temp Readings from Last 3 Encounters:  01/21/18 98.1 F (36.7 C) (Oral)  12/18/17 98.2 F (36.8 C) (Oral)  12/13/17 98.1 F (36.7 C) (Oral)   BP Readings from Last 3 Encounters:  01/21/18 110/78  12/18/17 90/62  12/13/17 108/75   Pulse Readings from Last 3 Encounters:  01/21/18 80  12/18/17 61  12/13/17 63    Physical Exam  Constitutional: She is oriented to person, place, and time. Vital signs are normal. She appears well-developed and well-nourished. She is cooperative.  HENT:  Head: Normocephalic and atraumatic.  Mouth/Throat: Oropharynx is clear and moist and mucous membranes are normal.  Eyes: Pupils are equal, round, and reactive to light. Conjunctivae are normal.  Cardiovascular: Normal rate, regular rhythm and normal heart sounds.  Pulmonary/Chest: Effort normal and breath sounds normal.  Musculoskeletal:       Cervical back: She exhibits tenderness.  Neurological: She is alert and oriented to person, place, and time.  Skin: Skin is warm, dry and intact.  Psychiatric: She has a normal mood and affect. Her speech is normal and behavior is normal. Judgment and thought content normal. Cognition and memory are normal.  Nursing note and vitals reviewed.   Assessment   1. Cervical radiculopathy abnormal MRI  01/16/18  2. 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 Right knee pain and swelling abnormal knee mir 08/2016  4. Overactive bladder  5. HM Plan  1. Refer to pain clinic Dr. Sharlet Salina or Mindi Slicker dose percocet tramadol not helping  Depo 40 x 1 today  Gabapentin did not help in the past Refer to NS Dr. Deetta Perla  2. CC Dr. Gabriel Carina to see if needs medication to shrink tumor see HPI with left vision being affected  3. F/u [facility] had arthroscopy already  4. Cont oxybutynin 5.  Had flu shot  Check other vaccines in my notes [facility]  -will need Tdap and shingrix in future pna 23 had 03/10/16   S/p hysterectomy no cervix and f/u OB/GYN h/o abnormal pap s/p cryo  -pap 05/08/15 neg pap  Colonoscopy Dr. Allen Norris 01/2017 polyps, gastritis  Mammogram had 09/29/17 uni right neg  CT chest had 06/12/16 with chronic changes lung bases otherwise normal former smoker  [facility]  -will need to check A1C in future h/o DM2 vs prediabetes  -A1C 6.0 07/02/17  -will need to check complete thyroid labs in future tsh, t4 h/o abnormal   -hcv antibody reactive 01/03/16 neg VL and 12/02/16 + antibody -hep B sAg neg 02/12/16 hep B s Ab quant 34.8, hep B core Ab total 12/02/16 +(positive) hep B DNA negative, 12/02/16 hep B core Ab IgM +, hep B eAb +, hep B eAg negative  -rpr neg 12/02/16  -hiv neg 09/02/16  -GC/C neg, trich neg 12/02/16  -h/o CAD/MI, PAD s/p right SFA atherectomy and PTA 12/2010 arterial duplex nl 01/2012  schf eccho 08/2010 40-45% mild diffuse HK  -h/o kidney stones  -[place] 07/09/15 small right inguinal lymph node  -mild osa   Psuh GB removal  CABG x 4 10/2010 [facility]  PCI L Cx 2003 PCI LAD 2012 [facility]  Femoral artery stent  egd Wohl 01/2015 medium HH  Cards Dr. [name]  Eye [place] [facility]  NS Dr. Lacinda Axon [facility]  Dentist [facility]  Endocrine Dr. Gabriel Carina  7. Monitor  Will get labs [facility]  This is [facility] pt from [facility]   Provider: Dr. Olivia Mackie [name]-[facility]

## 2018-01-21 NOTE — Progress Notes (Signed)
Note has been sent electronically. 

## 2018-01-22 ENCOUNTER — Encounter: Payer: Self-pay | Admitting: Internal Medicine

## 2018-01-27 DIAGNOSIS — D352 Benign neoplasm of pituitary gland: Secondary | ICD-10-CM | POA: Diagnosis not present

## 2018-02-01 DIAGNOSIS — M5030 Other cervical disc degeneration, unspecified cervical region: Secondary | ICD-10-CM | POA: Diagnosis not present

## 2018-02-01 DIAGNOSIS — M5412 Radiculopathy, cervical region: Secondary | ICD-10-CM | POA: Diagnosis not present

## 2018-02-01 DIAGNOSIS — M1711 Unilateral primary osteoarthritis, right knee: Secondary | ICD-10-CM | POA: Diagnosis not present

## 2018-02-01 DIAGNOSIS — M4722 Other spondylosis with radiculopathy, cervical region: Secondary | ICD-10-CM | POA: Diagnosis not present

## 2018-02-02 DIAGNOSIS — R202 Paresthesia of skin: Secondary | ICD-10-CM | POA: Diagnosis not present

## 2018-02-02 DIAGNOSIS — R200 Anesthesia of skin: Secondary | ICD-10-CM | POA: Diagnosis not present

## 2018-02-17 ENCOUNTER — Other Ambulatory Visit: Payer: Self-pay | Admitting: Internal Medicine

## 2018-02-17 DIAGNOSIS — G8929 Other chronic pain: Secondary | ICD-10-CM

## 2018-02-17 DIAGNOSIS — M545 Low back pain: Secondary | ICD-10-CM

## 2018-02-17 DIAGNOSIS — M542 Cervicalgia: Secondary | ICD-10-CM

## 2018-02-17 MED ORDER — CYCLOBENZAPRINE HCL 5 MG PO TABS: 5.0000 mg | ORAL_TABLET | Freq: Two times a day (BID) | ORAL | 1 refills | Status: DC | PRN

## 2018-02-19 ENCOUNTER — Telehealth: Payer: Self-pay | Admitting: Internal Medicine

## 2018-02-19 ENCOUNTER — Other Ambulatory Visit: Payer: Self-pay | Admitting: Internal Medicine

## 2018-02-19 DIAGNOSIS — M542 Cervicalgia: Secondary | ICD-10-CM

## 2018-02-19 MED ORDER — OXYCODONE-ACETAMINOPHEN 5-325 MG PO TABS: 1.0000 | ORAL_TABLET | Freq: Three times a day (TID) | ORAL | 0 refills | Status: DC | PRN

## 2018-02-19 NOTE — Telephone Encounter (Signed)
Copied from [place] [phone]. Topic: Inquiry >> Feb 19, 2018 11:58 AM Margot Ables wrote: Reason for CRM: pt called stating that she has appt 7/31 with Dr. Terese Door and with the neurosurgeon. Pt has been taking tylenol and aleve and said she isn't supposed to. She said the pain in her arm has gotten worse and isn't able to bathe herself or sleep. She said it's affecting her hand now too. Pt is requesting something for pain and understands there are restrictions on medications. She has tried heating pad and ice packs but nothing is helping her. Pt requesting something to help her thru until her appt 02/24/18  [place] (N), [place] - [place] [phone] (Phone) [phone] (Fax)

## 2018-02-19 NOTE — Telephone Encounter (Signed)
rx for pain med request

## 2018-02-24 ENCOUNTER — Encounter: Payer: Self-pay | Admitting: Internal Medicine

## 2018-02-24 ENCOUNTER — Other Ambulatory Visit: Payer: Self-pay | Admitting: Neurosurgery

## 2018-02-24 ENCOUNTER — Ambulatory Visit (INDEPENDENT_AMBULATORY_CARE_PROVIDER_SITE_OTHER): Payer: Medicare HMO | Admitting: Internal Medicine

## 2018-02-24 ENCOUNTER — Other Ambulatory Visit: Payer: Self-pay

## 2018-02-24 VITALS — BP 138/84 | HR 70 | Temp 98.0°F | Ht 65.0 in | Wt 211.0 lb

## 2018-02-24 DIAGNOSIS — R202 Paresthesia of skin: Secondary | ICD-10-CM

## 2018-02-24 DIAGNOSIS — M5412 Radiculopathy, cervical region: Secondary | ICD-10-CM

## 2018-02-24 DIAGNOSIS — M542 Cervicalgia: Secondary | ICD-10-CM

## 2018-02-24 DIAGNOSIS — M79601 Pain in right arm: Secondary | ICD-10-CM | POA: Diagnosis not present

## 2018-02-24 DIAGNOSIS — R200 Anesthesia of skin: Secondary | ICD-10-CM | POA: Diagnosis not present

## 2018-02-24 DIAGNOSIS — M5416 Radiculopathy, lumbar region: Secondary | ICD-10-CM

## 2018-02-24 HISTORY — DX: Paresthesia of skin: R20.0

## 2018-02-24 MED ORDER — PREDNISONE 20 MG PO TABS: ORAL_TABLET | ORAL | 0 refills | Status: DC

## 2018-02-24 MED ORDER — OXYCODONE-ACETAMINOPHEN 10-325 MG PO TABS: 1.0000 | ORAL_TABLET | Freq: Three times a day (TID) | ORAL | 0 refills | Status: DC | PRN

## 2018-02-24 NOTE — Progress Notes (Signed)
Chief Complaint  Patient presents with  . Follow-up   F/u   1. Cervical radiculopathy c/o 10/10 pain pain radiates to right hand causing right hand weakness and numbness down right hand. Taking Flexeril 2 pills bid w/p relief and percocet 5-325 mg tid prn w/o relief appt today for RUE EMG/NCS .She has not had f/u with Dr. Sharlet Salina but saw Dr. Lacinda Axon 02/02/18   2. C/o right big toe and left toe numbness b/l h/o MRI 2017 abnormal as well   Review of Systems  Respiratory: Negative for shortness of breath.   Cardiovascular: Negative for chest pain.  Musculoskeletal: Positive for back pain, joint pain and neck pain.  Neurological: Positive for sensory change and weakness.   Past Medical History:  Diagnosis Date  . (HFpEF) heart failure with preserved ejection fraction ([place])    a. 06/2016 Echo: EF 50%, no rwma, mild to mod TR.  [name] Allergy   . Anemia   . Arthritis   . Asthma   . Bacterial vaginitis   . Blood in stool   . Brachial neuritis or radiculitis NOS   . 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 being followed by neurosurgery/eye doctor and pcp. monitoring size.causes sinus problems  . Bronchitis   . Cardiac arrhythmia   . Cervical neck pain with evidence of disc disease    C5/6 disease, MRI done late 2012 - no records available  . Chronic pain   . Cocaine abuse, in remission ([place])    clean x 24 years  . Colon polyps   . COPD (chronic obstructive pulmonary disease) (HCC)    many inhalers  . Coronary artery disease    a. PCI of LCX 2003; b. PCI of the LAD 2012 with a (2.5 x 8 mm BMS);  c.s/p CABG 4/12:  L-LAD, S-Dx, S-OM, S-RCA (Dr. Prescott Gum);  d. 01/2012 MV: inf infarct, attenuation, no ischemia.  . Depression   . Diabetes mellitus without complication ([place])   . Family history of colon cancer   . Generalized headaches    frequent  . GERD (gastroesophageal reflux disease)   . Headache   . Heart murmur   . Hematochezia   . Hepatitis    history of hepatitis b  .  History of cervical cancer    s/p cryotherapy  . History of drug abuse    cocaine, marijuana, clean since 1989  . History of hepatitis B    from eating undercooked liver  . History of kidney stones   . History of MI (myocardial infarction)   . Hyperlipidemia   . Hypertension   . Myocardial infarction [facility]) 2003, 2012  . PAD (peripheral artery disease) (HCC)    s/p Right SFA atherectomy and PTA 01/15/11, normal ABIs 01/2011  . Polyp of colon   . Seasonal allergies   . Sleep apnea    uses cpap  . Smoking history    quit 07/2010  . Systolic CHF, chronic (HCC)    mild: echo 08/2010 - mildly reduced EF 40-45%, mild diffuse hypokinesis  . Thyroid disease   . Urinary incontinence   . Urine incontinence    Past Surgical History:  Procedure Laterality Date  . ABDOMINAL HYSTERECTOMY     2003  . CHOLECYSTECTOMY  1986  . COLONOSCOPY  2008   3 polyps  . COLONOSCOPY WITH PROPOFOL N/A 02/06/2015   Procedure: COLONOSCOPY WITH PROPOFOL;  Surgeon: Lucilla Lame, MD;  Location: [facility] ENDOSCOPY;  Service: Endoscopy;  Laterality: N/A;  .  COLONOSCOPY WITH PROPOFOL N/A 01/27/2017   Procedure: COLONOSCOPY WITH PROPOFOL;  Surgeon: Lucilla Lame, MD;  Location[facility] ENDOSCOPY;  Service: Endoscopy;  Laterality: N/A;  . CORONARY ANGIOPLASTY     w/ stent placement x2  . CORONARY ARTERY BYPASS GRAFT  2012   Dr [name]  . CORONARY STENT PLACEMENT  2003   S/P MI  . CORONARY STENT PLACEMENT  2007   [place]  . ESOPHAGOGASTRODUODENOSCOPY (EGD) WITH PROPOFOL N/A 02/06/2015   Procedure: ESOPHAGOGASTRODUODENOSCOPY (EGD) WITH PROPOFOL;  Surgeon: Lucilla Lame, MD;  Location: [facility] ENDOSCOPY;  Service: Endoscopy;  Laterality: N/A;  . ESOPHAGOGASTRODUODENOSCOPY (EGD) WITH PROPOFOL N/A 01/27/2017   Procedure: ESOPHAGOGASTRODUODENOSCOPY (EGD) WITH PROPOFOL;  Surgeon: Lucilla Lame, MD;  Location: [facility] ENDOSCOPY;  Service: Endoscopy;  Laterality: N/A;  . FEMORAL ARTERY STENT  10/2010   right sided (Dr. Burt Knack)  . KNEE ARTHROSCOPY  WITH MEDIAL MENISECTOMY Right 08/20/2017   Procedure: KNEE ARTHROSCOPY WITH MEDIAL  AND LATERAL MENISECTOMY;  Surgeon: Hessie Knows, MD;  Location: [facility] ORS;  Service: Orthopedics;  Laterality: Right;  . TUBAL LIGATION     Family History  Problem Relation Age of Onset  . Hypertension Father   . Heart failure Father   . Diabetes Father   . Colon cancer Father 97  . Glaucoma Father   . Cancer Father        colorectal   . Heart disease Father   . Other Father        glaucoma  . Breast cancer Mother 36       breast cancer, late 31's  . Cancer Mother        breast  . Brain cancer Sister   . Arthritis Sister   . Diabetes Sister   . Hypertension Sister   . Kidney disease Sister   . Diabetes Brother   . Hypertension Brother   . Other Sister        brain tumor   . Coronary artery disease Neg Hx   . Stroke Neg Hx    Social History   Socioeconomic History  . Marital status: Legally Separated    Spouse name: Not on file  . Number of children: Not on file  . Years of education: Not on file  . Highest education level: Not on file  Occupational History  . Not on file  Social Needs  . Financial resource strain: Not on file  . Food insecurity:    Worry: Not on file    Inability: Not on file  . Transportation needs:    Medical: Not on file    Non-medical: Not on file  Tobacco Use  . Smoking status: Former Smoker    Packs/day: 1.00    Years: 38.00    Pack years: 38.00    Types: Cigarettes    Last attempt to quit: 08/12/2010    Years since quitting: 7.5  . Smokeless tobacco: Never Used  Substance and Sexual Activity  . Alcohol use: No  . Drug use: No    Types: Cocaine    Comment: Remote Hx (crack cocaine and marijuana)  . Sexual activity: Yes  Lifestyle  . Physical activity:    Days per week: Not on file    Minutes per session: Not on file  . Stress: Not on file  Relationships  . Social connections:    Talks on phone: Not on file    Gets together: Not on file     Attends religious service: Not on file  Active member of club or organization: Not on file    Attends meetings of clubs or organizations: Not on file    Relationship status: Not on file  . Intimate partner violence:    Fear of current or ex partner: Not on file    Emotionally abused: Not on file    Physically abused: Not on file    Forced sexual activity: Not on file  Other Topics Concern  . 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 acetaminophen (TYLENOL) 500 MG tablet Take 1,000 mg by mouth every 6 (six) hours as needed for mild pain or headache.  . albuterol (PROVENTIL HFA;VENTOLIN HFA) 108 (90 Base) MCG/ACT inhaler Inhale 2 puffs into the lungs every 6 (six) hours as needed for wheezing or shortness of breath.  [name] aspirin 81 MG tablet Take 1 tablet (81 mg total) by mouth daily.  . Azelastine HCl 0.15 % SOLN Place 2 sprays into both nostrils daily as needed (allergies).   . cyclobenzaprine (FLEXERIL) 5 MG tablet Take 1-2 tablets (5-10 mg total) by mouth 2 (two) times daily as needed for muscle spasms.  [name] esomeprazole (NEXIUM) 40 MG capsule Take 40 mg by mouth daily. Reported on 09/04/2015  . ezetimibe (ZETIA) 10 MG tablet TAKE ONE TABLET BY MOUTH ONCE DAILY  . fluticasone (FLONASE) 50 MCG/ACT nasal spray Place 2 sprays into both nostrils daily as needed for allergies.   . furosemide (LASIX) 20 MG tablet TAKE ONE TABLET BY MOUTH ONCE DAILY FOR SWELLING. MAY TAKE ONE ADDITIONAL TABLET AS NEEDED FOR SWELLING OR FLUID (Patient taking differently: TAKE ONE TABLET BY MOUTH ONCE DAILY AND ANOTHER DOSE IF NEEDED FOR FLUID BUILD  UP)  . isosorbide mononitrate (IMDUR) 30 MG 24 hr tablet TAKE ONE TABLET BY MOUTH TWICE DAILY  . KLOR-CON M10 10 MEQ tablet TAKE ONE TABLET BY MOUTH ONCE DAILY  . latanoprost (XALATAN) 0.005 % ophthalmic solution Place 1 drop into both eyes at bedtime.  [name] lisinopril (PRINIVIL,ZESTRIL) 5 MG tablet Take 1 tablet (5 mg total) by mouth daily.  . meclizine (ANTIVERT) 25 MG tablet Take 25 mg by mouth 3 (three) times daily as needed.  . metoprolol tartrate (LOPRESSOR) 25 MG tablet TAKE ONE TABLET BY MOUTH TWICE DAILY (Patient taking differently: Take 25 mg by mouth 2 (two) times daily. )  . nitroGLYCERIN (NITROSTAT) 0.4 MG SL tablet Place 1 tablet (0.4 mg total) under the tongue every 5 (five) minutes as needed. (Patient taking differently: Place 0.4 mg under the tongue every 5 (five) minutes as needed for chest pain. )  . oxybutynin (DITROPAN-XL) 5 MG 24 hr tablet Take 1 tablet (5 mg total) by mouth at bedtime.  [name] oxyCODONE-acetaminophen (PERCOCET) 5-325 MG tablet Take 1 tablet by mouth every 8 (eight) hours as needed for severe pain.  [name] PARoxetine (PAXIL) 10 MG tablet Take 10 mg by mouth daily.  [name] PRENATAL VIT-FE FUM-FE BISG-FA PO Take by mouth.  . rivaroxaban (XARELTO) 2.5 MG TABS tablet Take 1 tablet (2.5 mg total)  by mouth 2 (two) times daily.  . rosuvastatin (CRESTOR) 20 MG tablet Take 20 mg by mouth daily.   [name] umeclidinium-vilanterol (ANORO ELLIPTA) 62.5-25 MCG/INH AEPB Inhale 1 puff into the lungs daily.   Allergies  Allergen Reactions  . Latex Rash       . Shellfish Allergy Anaphylaxis    Hard shellfish/swelling in throat  . Sulfonamide Derivatives Anaphylaxis    Swelling in throat.  . Watermelon [Citrullus Vulgaris] Other (See Comments)    Throat itchy  . Other Swelling   Recent Results (from the past 2160 hour(s))  I-STAT creatinine     Status: None   Collection Time: 12/31/17  7:00 PM  Result Value Ref Range   Creatinine, Ser 1.00 0.44 - 1.00 mg/dL   Objective  Body mass  index is 35.11 kg/m. Wt Readings from Last 3 Encounters:  02/24/18 211 lb (95.7 kg)  01/21/18 210 lb (95.3 kg)  12/18/17 206 lb 9.6 oz (93.7 kg)   Temp Readings from Last 3 Encounters:  02/24/18 98 F (36.7 C) (Oral)  01/21/18 98.1 F (36.7 C) (Oral)  12/18/17 98.2 F (36.8 C) (Oral)   BP Readings from Last 3 Encounters:  02/24/18 138/84  01/21/18 110/78  12/18/17 90/62   Pulse Readings from Last 3 Encounters:  02/24/18 70  01/21/18 80  12/18/17 61    Physical Exam  Constitutional: She is oriented to person, place, and time. Vital signs are normal. She appears well-developed and well-nourished. She is cooperative.  HENT:  Head: Normocephalic and atraumatic.  Mouth/Throat: Oropharynx is clear and moist and mucous membranes are normal.  Eyes: Pupils are equal, round, and reactive to light. Conjunctivae are normal.  Cardiovascular: Normal rate, regular rhythm and normal heart sounds.  Pulmonary/Chest: Effort normal and breath sounds normal.  Neurological: She is alert and oriented to person, place, and time. Gait normal.  Numbness in RUE, right big and left big toe on exam  Weakness in RUE   Skin: Skin is warm, dry and intact.  Psychiatric: She has a normal mood and affect. Her speech is normal and behavior is normal. Judgment and thought content normal. Cognition and memory are normal.  Nursing note and vitals reviewed.   Assessment   1. Cervical radiculopathy with RUE numbness and weakness  2. C/w lumbar radiculopathy  3. HM Plan   1.  Will disc with Dr. Sharlet Salina and Dr. Deetta Perla  EMG today RUE  Consider EMG b/l lower ext  Tried gabapentin in the past not effective  Prednisone taper x 1 week  Inc. Percocet 10-325 tid prn if needed long term refer pain clinic  2.  Consider emg/ncs lower ext b/l  3.  Had flu shot  Check other vaccines in my notes [facility]  -will need Tdap and shingrix in future  pna 23 had 03/10/16   S/p hysterectomy no cervix and f/u  OB/GYN h/o abnormal pap s/p cryo  -pap 05/08/15 neg pap  Colonoscopy Dr. Allen Norris 01/2017 polyps, gastritis  Mammogram had 09/29/17 uni right neg  CT chest had 06/12/16 with chronic changes lung bases otherwise normal former smoker   [facility]ed [facility]  -will need to check A1C in future h/o DM2 vs prediabetes  -A1C 6.0 07/02/17  -will need to check complete thyroid labs in future tsh, t4 h/o abnormal   See note 01/21/18 [facility] records details reviewed Provider: Dr. Olivia Mackie [name]-[facility]

## 2018-02-24 NOTE — Patient Instructions (Addendum)
F/u in 6 weeks I will let you know when I disc with Dr. Sharlet Salina and Dr. Lacinda Axon  Take care  F/u in 6 weeks  Thank you Cervical Radiculopathy Cervical radiculopathy means that a nerve in the neck is pinched or bruised. This can cause pain or loss of feeling (numbness) that runs from your neck to your arm and fingers. Follow these instructions at home: Managing pain  Take over-the-counter and prescription medicines only as told by your doctor.  If directed, put ice on the injured or painful area. ? Put ice in a plastic bag. ? Place a towel between your skin and the bag. ? Leave the ice on for 20 minutes, 2-3 times per day.  If ice does not help, you can try using heat. Take a warm shower or warm bath, or use a heat pack as told by your doctor.  You may try a gentle neck and shoulder massage. Activity  Rest as needed. Follow instructions from your doctor about any activities to avoid.  Do exercises as told by your doctor or physical therapist. General instructions  If you were given a soft collar, wear it as told by your doctor.  Use a flat pillow when you sleep.  Keep all follow-up visits as told by your doctor. This is important. Contact a doctor if:  Your condition does not improve with treatment. Get help right away if:  Your pain gets worse and is not controlled with medicine.  You lose feeling or feel weak in your hand, arm, face, or leg.  You have a fever.  You have a stiff neck.  You cannot control when you poop or pee (have incontinence).  You have trouble with walking, balance, or talking. This information is not intended to replace advice given to you by your health care provider. Make sure you discuss any questions you have with your health care provider. Document Released: 07/03/2011 Document Revised: 12/20/2015 Document Reviewed: 09/07/2014 Elsevier Interactive Patient Education  [employer].

## 2018-02-25 ENCOUNTER — Ambulatory Visit
Admission: RE | Admit: 2018-02-25 | Discharge: 2018-02-25 | Disposition: A | Discharge status: Home / Self Care | Payer: Medicare HMO | Source: Ambulatory Visit | Attending: Neurosurgery | Admitting: Neurosurgery

## 2018-02-25 DIAGNOSIS — R200 Anesthesia of skin: Secondary | ICD-10-CM | POA: Diagnosis not present

## 2018-02-25 DIAGNOSIS — M5412 Radiculopathy, cervical region: Secondary | ICD-10-CM | POA: Diagnosis not present

## 2018-03-01 ENCOUNTER — Other Ambulatory Visit: Payer: Self-pay | Admitting: Neurosurgery

## 2018-03-01 DIAGNOSIS — M5412 Radiculopathy, cervical region: Secondary | ICD-10-CM

## 2018-03-02 ENCOUNTER — Ambulatory Visit
Admission: RE | Admit: 2018-03-02 | Discharge: 2018-03-02 | Disposition: A | Discharge status: Home / Self Care | Payer: Medicare HMO | Source: Ambulatory Visit | Attending: Neurosurgery | Admitting: Neurosurgery

## 2018-03-02 ENCOUNTER — Other Ambulatory Visit: Payer: Self-pay

## 2018-03-02 ENCOUNTER — Telehealth: Payer: Self-pay | Admitting: Cardiovascular Disease

## 2018-03-02 ENCOUNTER — Encounter
Admission: RE | Admit: 2018-03-02 | Discharge: 2018-03-02 | Disposition: A | Discharge status: Home / Self Care | Payer: Medicare HMO | Source: Ambulatory Visit | Attending: Neurosurgery | Admitting: Neurosurgery

## 2018-03-02 DIAGNOSIS — Z01818 Encounter for other preprocedural examination: Secondary | ICD-10-CM | POA: Diagnosis not present

## 2018-03-02 DIAGNOSIS — Z951 Presence of aortocoronary bypass graft: Secondary | ICD-10-CM | POA: Insufficient documentation

## 2018-03-02 DIAGNOSIS — Z01810 Encounter for preprocedural cardiovascular examination: Secondary | ICD-10-CM | POA: Diagnosis not present

## 2018-03-02 DIAGNOSIS — J9811 Atelectasis: Secondary | ICD-10-CM | POA: Diagnosis not present

## 2018-03-02 DIAGNOSIS — R9431 Abnormal electrocardiogram [ECG] [EKG]: Secondary | ICD-10-CM | POA: Insufficient documentation

## 2018-03-02 LAB — URINALYSIS, ROUTINE W REFLEX MICROSCOPIC
BILIRUBIN URINE: NEGATIVE
Glucose, UA: NEGATIVE mg/dL
HGB URINE DIPSTICK: NEGATIVE
Ketones, ur: NEGATIVE mg/dL
Leukocytes, UA: NEGATIVE
Nitrite: NEGATIVE
PH: 5 (ref 5.0–8.0)
Protein, ur: NEGATIVE mg/dL
SPECIFIC GRAVITY, URINE: 1.015 (ref 1.005–1.030)

## 2018-03-02 LAB — CBC
HEMATOCRIT: 45.5 % (ref 35.0–47.0)
HEMOGLOBIN: 15.5 g/dL (ref 12.0–16.0)
MCH: 32.3 pg (ref 26.0–34.0)
MCHC: 34.1 g/dL (ref 32.0–36.0)
MCV: 94.7 fL (ref 80.0–100.0)
Platelets: 203 10*3/uL (ref 150–440)
RBC: 4.8 MIL/uL (ref 3.80–5.20)
RDW: 13.4 % (ref 11.5–14.5)
WBC: 9.5 10*3/uL (ref 3.6–11.0)

## 2018-03-02 LAB — BASIC METABOLIC PANEL
Anion gap: 10 (ref 5–15)
BUN: 20 mg/dL (ref 6–20)
CHLORIDE: 103 mmol/L (ref 98–111)
CO2: 28 mmol/L (ref 22–32)
CREATININE: 1.26 mg/dL — AB (ref 0.44–1.00)
Calcium: 9.5 mg/dL (ref 8.9–10.3)
GFR calc Af Amer: 53 mL/min — ABNORMAL LOW (ref >60)
GFR calc non Af Amer: 46 mL/min — ABNORMAL LOW (ref >60)
Glucose, Bld: 94 mg/dL (ref 70–99)
POTASSIUM: 3.4 mmol/L — AB (ref 3.5–5.1)
Sodium: 141 mmol/L (ref 135–145)

## 2018-03-02 LAB — DIFFERENTIAL
Basophils Absolute: 0.1 10*3/uL (ref 0–0.1)
Basophils Relative: 1 %
Eosinophils Absolute: 0 10*3/uL (ref 0–0.7)
Eosinophils Relative: 0 %
LYMPHS ABS: 1.7 10*3/uL (ref 1.0–3.6)
LYMPHS PCT: 18 %
MONO ABS: 0.2 10*3/uL (ref 0.2–0.9)
Monocytes Relative: 2 %
NEUTROS ABS: 7.6 10*3/uL — AB (ref 1.4–6.5)
Neutrophils Relative %: 79 %

## 2018-03-02 LAB — TYPE AND SCREEN
ABO/RH(D): B POS
Antibody Screen: NEGATIVE

## 2018-03-02 LAB — PROTIME-INR
INR: 1
PROTHROMBIN TIME: 13.1 s (ref 11.4–15.2)

## 2018-03-02 LAB — SURGICAL PCR SCREEN
MRSA, PCR: NEGATIVE
Staphylococcus aureus: NEGATIVE

## 2018-03-02 LAB — APTT: aPTT: 30 seconds (ref 24–36)

## 2018-03-02 NOTE — Telephone Encounter (Signed)
She is at moderate risk.  Xarelto can be held 2 days before the surgery.  Aspirin can be held 7 days before the surgery.

## 2018-03-02 NOTE — Telephone Encounter (Signed)
Routing to Dr [name] and Dr Fletcher Anon.

## 2018-03-02 NOTE — Telephone Encounter (Signed)
Patient came to office      [facility] Pre-operative Risk Assessment    Request for surgical clearance:  1. What type of surgery is being performed?  Posterior cervical laminectomy - c7   When is this surgery scheduled? 08-12 2. What type of clearance is required (medical clearance vs. Pharmacy clearance to hold med vs. Both)? Pharmacy per patient   3. Are there any medications that need to be held prior to surgery and how long? Please advise asa and McLean name and name of physician performing surgery? Dr. Lacinda Axon [facility]  4. What is your office phone number [phone]   7.   What is your office fax number [phone]  8.   Anesthesia type (None, local, MAC, general) ? Unknown   Patient will call surgeon office and have them send a clearance form.   Clarisse Gouge 03/02/2018, 3:47 PM  _________________________________________________________________   (provider comments below)

## 2018-03-02 NOTE — Patient Instructions (Signed)
Your procedure is scheduled on: Monday 03/08/18 Report to [facility]. To find out your arrival time please call [phone] between 1PM - 3PM on Friday 03/05/18.  Remember: Instructions that are not followed completely may result in serious medical risk, up to and including death, or upon the discretion of your surgeon and anesthesiologist your surgery may need to be rescheduled.     _X__ 1. Do not eat food after midnight the night before your procedure.                 No gum chewing or hard candies. You may drink clear liquids up to 2 hours                 before you are scheduled to arrive for your surgery- DO not drink clear                 liquids within 2 hours of the start of your surgery.                 Clear Liquids include:  water, apple juice without pulp, clear carbohydrate                 drink such as Clearfast or Gatorade, Black Coffee or Tea (Do not add                 anything to coffee or tea).  __X__2.  On the morning of surgery brush your teeth with toothpaste and water, you                 may rinse your mouth with mouthwash if you wish.  Do not swallow any              toothpaste of mouthwash.     _X__ 3.  No Alcohol for 24 hours before or after surgery.   _X__ 4.  Do Not Smoke or use e-cigarettes For 24 Hours Prior to Your Surgery.                 Do not use any chewable tobacco products for at least 6 hours prior to                 surgery.  ____  5.  Bring all medications with you on the day of surgery if instructed.   __X__  6.  Notify your doctor if there is any change in your medical condition      (cold, fever, infections).     Do not wear jewelry, make-up, hairpins, clips or nail polish. Do not wear lotions, powders, or perfumes.  Do not shave 48 hours prior to surgery. Men may shave face and neck. Do not bring valuables to the hospital.    [facility] is not responsible for any belongings or  valuables.  Contacts, dentures/partials or body piercings may not be worn into surgery. Bring a case for your contacts, glasses or hearing aids, a denture cup will be supplied. Leave your suitcase in the car. After surgery it may be brought to your room. For patients admitted to the hospital, discharge time is determined by your treatment team.   Patients discharged the day of surgery will not be allowed to drive home.   Please read over the following fact sheets that you were given:   MRSA Information  __X__ Take these medicines the morning of surgery with A SIP OF WATER:  1. esomeprazole (NEXIUM  2. ezetimibe (ZETIA  3. isosorbide mononitrate (IMDUR)   4. meclizine (ANTIVERT)  5. metoprolol tartrate (LOPRESSOR)  6. PARoxetine (PAXIL)  7. rosuvastatin (CRESTOR)   ____ Fleet Enema (as directed)   __X__ Use CHG Soap/SAGE wipes as directed  __x__ Use inhalers on the day of surgery ALBUTEROL AND ANORO  ____ Stop metformin/Janumet/Farxiga 2 days prior to surgery    ____ Take 1/2 of usual insulin dose the night before surgery. No insulin the morning          of surgery.   __X__ Stop Blood Thinners Coumadin/Plavix/Xarelto/Pleta/Pradaxa/Eliquis/Effient/Aspirin  on   Or contact your Surgeon, Cardiologist or Medical Doctor regarding  ability to stop your blood thinners  __X__ Stop Anti-inflammatories 7 days before surgery such as Advil, Ibuprofen, Motrin,  BC or Goodies Powder, Naprosyn, Naproxen, Aleve YOU MAY TAKE TYLENOL   __X__ Stop all herbal supplements, fish oil or vitamin E until after surgery.    __X__ Bring C-Pap to the hospital.

## 2018-03-03 ENCOUNTER — Ambulatory Visit: Payer: Medicare HMO

## 2018-03-03 NOTE — Telephone Encounter (Signed)
Routed to number provided via EPIC fax.  

## 2018-03-04 NOTE — Pre-Procedure Instructions (Signed)
CXR FAXED TO DR Lacinda Axon

## 2018-03-08 ENCOUNTER — Inpatient Hospital Stay
Admission: RE | Admit: 2018-03-08 | Discharge: 2018-03-09 | DRG: 029 | Disposition: A | Discharge status: Home / Self Care | Payer: Medicare HMO | Attending: Neurosurgery | Admitting: Neurosurgery

## 2018-03-08 ENCOUNTER — Encounter
Admission: RE | Disposition: A | Discharge status: Home / Self Care | Payer: Self-pay | Source: Home / Self Care | Attending: Neurosurgery

## 2018-03-08 ENCOUNTER — Inpatient Hospital Stay: Payer: Medicare HMO | Admitting: Anesthesiology

## 2018-03-08 ENCOUNTER — Inpatient Hospital Stay: Payer: Medicare HMO

## 2018-03-08 ENCOUNTER — Other Ambulatory Visit: Payer: Self-pay

## 2018-03-08 DIAGNOSIS — G4730 Sleep apnea, unspecified: Secondary | ICD-10-CM | POA: Diagnosis not present

## 2018-03-08 DIAGNOSIS — Z91013 Allergy to seafood: Secondary | ICD-10-CM | POA: Diagnosis not present

## 2018-03-08 DIAGNOSIS — Z951 Presence of aortocoronary bypass graft: Secondary | ICD-10-CM

## 2018-03-08 DIAGNOSIS — J449 Chronic obstructive pulmonary disease, unspecified: Secondary | ICD-10-CM | POA: Diagnosis not present

## 2018-03-08 DIAGNOSIS — M5412 Radiculopathy, cervical region: Secondary | ICD-10-CM | POA: Diagnosis not present

## 2018-03-08 DIAGNOSIS — E1151 Type 2 diabetes mellitus with diabetic peripheral angiopathy without gangrene: Secondary | ICD-10-CM | POA: Diagnosis not present

## 2018-03-08 DIAGNOSIS — M4804 Spinal stenosis, thoracic region: Secondary | ICD-10-CM | POA: Diagnosis present

## 2018-03-08 DIAGNOSIS — I5032 Chronic diastolic (congestive) heart failure: Secondary | ICD-10-CM | POA: Diagnosis present

## 2018-03-08 DIAGNOSIS — Z90710 Acquired absence of both cervix and uterus: Secondary | ICD-10-CM

## 2018-03-08 DIAGNOSIS — Z91018 Allergy to other foods: Secondary | ICD-10-CM | POA: Diagnosis not present

## 2018-03-08 DIAGNOSIS — E119 Type 2 diabetes mellitus without complications: Secondary | ICD-10-CM | POA: Diagnosis not present

## 2018-03-08 DIAGNOSIS — M7130 Other bursal cyst, unspecified site: Secondary | ICD-10-CM | POA: Diagnosis present

## 2018-03-08 DIAGNOSIS — Z91040 Latex allergy status: Secondary | ICD-10-CM | POA: Diagnosis not present

## 2018-03-08 DIAGNOSIS — E785 Hyperlipidemia, unspecified: Secondary | ICD-10-CM | POA: Diagnosis not present

## 2018-03-08 DIAGNOSIS — M9901 Segmental and somatic dysfunction of cervical region: Secondary | ICD-10-CM

## 2018-03-08 DIAGNOSIS — Z7901 Long term (current) use of anticoagulants: Secondary | ICD-10-CM | POA: Diagnosis not present

## 2018-03-08 DIAGNOSIS — Z79899 Other long term (current) drug therapy: Secondary | ICD-10-CM

## 2018-03-08 DIAGNOSIS — R531 Weakness: Secondary | ICD-10-CM | POA: Diagnosis not present

## 2018-03-08 DIAGNOSIS — Z9109 Other allergy status, other than to drugs and biological substances: Secondary | ICD-10-CM | POA: Diagnosis not present

## 2018-03-08 DIAGNOSIS — Z981 Arthrodesis status: Secondary | ICD-10-CM | POA: Diagnosis not present

## 2018-03-08 DIAGNOSIS — Z955 Presence of coronary angioplasty implant and graft: Secondary | ICD-10-CM

## 2018-03-08 DIAGNOSIS — Z87891 Personal history of nicotine dependence: Secondary | ICD-10-CM

## 2018-03-08 DIAGNOSIS — Z7982 Long term (current) use of aspirin: Secondary | ICD-10-CM

## 2018-03-08 DIAGNOSIS — I110 Hypertensive heart disease with heart failure: Secondary | ICD-10-CM | POA: Diagnosis not present

## 2018-03-08 DIAGNOSIS — I252 Old myocardial infarction: Secondary | ICD-10-CM | POA: Diagnosis not present

## 2018-03-08 DIAGNOSIS — I5022 Chronic systolic (congestive) heart failure: Secondary | ICD-10-CM | POA: Diagnosis not present

## 2018-03-08 DIAGNOSIS — R29898 Other symptoms and signs involving the musculoskeletal system: Secondary | ICD-10-CM | POA: Diagnosis not present

## 2018-03-08 DIAGNOSIS — I2510 Atherosclerotic heart disease of native coronary artery without angina pectoris: Secondary | ICD-10-CM | POA: Diagnosis not present

## 2018-03-08 HISTORY — PX: POSTERIOR CERVICAL LAMINECTOMY: SHX2248

## 2018-03-08 LAB — GLUCOSE, CAPILLARY
GLUCOSE-CAPILLARY: 115 mg/dL — AB (ref 70–99)
Glucose-Capillary: 105 mg/dL — ABNORMAL HIGH (ref 70–99)

## 2018-03-08 LAB — ABO/RH: ABO/RH(D): B POS

## 2018-03-08 SURGERY — POSTERIOR CERVICAL LAMINECTOMY
Anesthesia: General | Wound class: Clean

## 2018-03-08 MED ORDER — MIDAZOLAM HCL 2 MG/2ML IJ SOLN
INTRAMUSCULAR | Status: AC
  Filled 2018-03-08: qty 2

## 2018-03-08 MED ORDER — FAMOTIDINE 20 MG PO TABS: 20.0000 mg | ORAL_TABLET | Freq: Once | ORAL | Status: DC

## 2018-03-08 MED ORDER — FENTANYL CITRATE (PF) 100 MCG/2ML IJ SOLN
INTRAMUSCULAR | Status: DC | PRN
  Administered 2018-03-08: 150 ug via INTRAVENOUS

## 2018-03-08 MED ORDER — DEXAMETHASONE SODIUM PHOSPHATE 10 MG/ML IJ SOLN
INTRAMUSCULAR | Status: AC
  Filled 2018-03-08: qty 1

## 2018-03-08 MED ORDER — REMIFENTANIL HCL 1 MG IV SOLR
INTRAVENOUS | Status: AC
  Filled 2018-03-08: qty 2000

## 2018-03-08 MED ORDER — PROPOFOL 10 MG/ML IV BOLUS
INTRAVENOUS | Status: AC
  Filled 2018-03-08: qty 40

## 2018-03-08 MED ORDER — SODIUM CHLORIDE 0.9 % IV SOLN
INTRAVENOUS | Status: DC
  Administered 2018-03-08: 09:00:00 via INTRAVENOUS

## 2018-03-08 MED ORDER — SODIUM CHLORIDE 0.9% FLUSH
3.0000 mL | Freq: Two times a day (BID) | INTRAVENOUS | Status: DC
  Administered 2018-03-08: 3 mL via INTRAVENOUS

## 2018-03-08 MED ORDER — DEXAMETHASONE SODIUM PHOSPHATE 10 MG/ML IJ SOLN
INTRAMUSCULAR | Status: DC | PRN
  Administered 2018-03-08: 10 mg via INTRAVENOUS

## 2018-03-08 MED ORDER — METHOCARBAMOL 500 MG PO TABS
750.0000 mg | ORAL_TABLET | Freq: Four times a day (QID) | ORAL | Status: DC
  Administered 2018-03-08 – 2018-03-09 (×3): 750 mg via ORAL
  Filled 2018-03-08: qty 2
  Filled 2018-03-08: qty 1
  Filled 2018-03-08 (×3): qty 2

## 2018-03-08 MED ORDER — PROPOFOL 500 MG/50ML IV EMUL
INTRAVENOUS | Status: DC | PRN
  Administered 2018-03-08: 150 ug/kg/min via INTRAVENOUS

## 2018-03-08 MED ORDER — VANCOMYCIN HCL 1000 MG IV SOLR
INTRAVENOUS | Status: AC
  Filled 2018-03-08: qty 1000

## 2018-03-08 MED ORDER — METHYLPREDNISOLONE ACETATE 40 MG/ML IJ SUSP
INTRAMUSCULAR | Status: DC | PRN
  Administered 2018-03-08: 40 mg

## 2018-03-08 MED ORDER — OXYCODONE HCL 5 MG PO TABS
10.0000 mg | ORAL_TABLET | ORAL | Status: DC | PRN
  Administered 2018-03-08: 10 mg via ORAL
  Filled 2018-03-08 (×2): qty 2

## 2018-03-08 MED ORDER — UMECLIDINIUM-VILANTEROL 62.5-25 MCG/INH IN AEPB
1.0000 | INHALATION_SPRAY | Freq: Every day | RESPIRATORY_TRACT | Status: DC
  Administered 2018-03-08 – 2018-03-09 (×3): 1 via RESPIRATORY_TRACT
  Filled 2018-03-08: qty 14

## 2018-03-08 MED ORDER — MIDAZOLAM HCL 2 MG/2ML IJ SOLN
INTRAMUSCULAR | Status: DC | PRN
  Administered 2018-03-08: 2 mg via INTRAVENOUS

## 2018-03-08 MED ORDER — FLUTICASONE PROPIONATE 50 MCG/ACT NA SUSP
2.0000 | Freq: Every day | NASAL | Status: DC | PRN
  Filled 2018-03-08: qty 16

## 2018-03-08 MED ORDER — PHENYLEPHRINE HCL 10 MG/ML IJ SOLN
INTRAMUSCULAR | Status: DC | PRN
  Administered 2018-03-08 (×5): 200 ug via INTRAVENOUS

## 2018-03-08 MED ORDER — OXYCODONE HCL 5 MG PO TABS
5.0000 mg | ORAL_TABLET | ORAL | Status: DC | PRN
  Administered 2018-03-08 – 2018-03-09 (×3): 5 mg via ORAL
  Filled 2018-03-08 (×2): qty 1

## 2018-03-08 MED ORDER — LIDOCAINE-EPINEPHRINE 1 %-1:100000 IJ SOLN
INTRAMUSCULAR | Status: DC | PRN
  Administered 2018-03-08: 10 mL

## 2018-03-08 MED ORDER — EZETIMIBE 10 MG PO TABS
10.0000 mg | ORAL_TABLET | Freq: Every day | ORAL | Status: DC
  Administered 2018-03-09: 10 mg via ORAL
  Filled 2018-03-08: qty 1

## 2018-03-08 MED ORDER — ACETAMINOPHEN 500 MG PO TABS
1000.0000 mg | ORAL_TABLET | Freq: Four times a day (QID) | ORAL | Status: AC
  Administered 2018-03-08 – 2018-03-09 (×4): 1000 mg via ORAL
  Filled 2018-03-08 (×4): qty 2

## 2018-03-08 MED ORDER — SODIUM CHLORIDE 0.9% FLUSH: 3.0000 mL | INTRAVENOUS | Status: DC | PRN

## 2018-03-08 MED ORDER — PHENOL 1.4 % MT LIQD
1.0000 | OROMUCOSAL | Status: DC | PRN
  Filled 2018-03-08: qty 177

## 2018-03-08 MED ORDER — DEXMEDETOMIDINE HCL IN NACL 200 MCG/50ML IV SOLN
INTRAVENOUS | Status: DC | PRN
  Administered 2018-03-08: 8 ug via INTRAVENOUS
  Administered 2018-03-08: 12 ug via INTRAVENOUS

## 2018-03-08 MED ORDER — PNEUMOCOCCAL VAC POLYVALENT 25 MCG/0.5ML IJ INJ
0.5000 mL | INJECTION | INTRAMUSCULAR | Status: DC
  Filled 2018-03-08: qty 0.5

## 2018-03-08 MED ORDER — ROSUVASTATIN CALCIUM 10 MG PO TABS
20.0000 mg | ORAL_TABLET | Freq: Every day | ORAL | Status: DC
  Administered 2018-03-08 – 2018-03-09 (×2): 20 mg via ORAL
  Filled 2018-03-08 (×2): qty 2

## 2018-03-08 MED ORDER — VANCOMYCIN HCL 1000 MG IV SOLR
INTRAVENOUS | Status: DC | PRN
  Administered 2018-03-08: 1000 mg via TOPICAL

## 2018-03-08 MED ORDER — FAMOTIDINE 20 MG PO TABS
ORAL_TABLET | ORAL | Status: AC
  Filled 2018-03-08: qty 1

## 2018-03-08 MED ORDER — ESMOLOL HCL 100 MG/10ML IV SOLN
INTRAVENOUS | Status: AC
  Filled 2018-03-08: qty 10

## 2018-03-08 MED ORDER — SODIUM CHLORIDE 0.9 % IV SOLN: 250.0000 mL | INTRAVENOUS | Status: DC

## 2018-03-08 MED ORDER — ONDANSETRON HCL 4 MG/2ML IJ SOLN
INTRAMUSCULAR | Status: AC
  Filled 2018-03-08: qty 2

## 2018-03-08 MED ORDER — ROCURONIUM BROMIDE 50 MG/5ML IV SOLN
INTRAVENOUS | Status: AC
  Filled 2018-03-08: qty 1

## 2018-03-08 MED ORDER — MECLIZINE HCL 25 MG PO TABS
25.0000 mg | ORAL_TABLET | Freq: Three times a day (TID) | ORAL | Status: DC
  Administered 2018-03-08 – 2018-03-09 (×3): 25 mg via ORAL
  Filled 2018-03-08 (×5): qty 1

## 2018-03-08 MED ORDER — ACETAMINOPHEN 325 MG PO TABS: 650.0000 mg | ORAL_TABLET | ORAL | Status: DC | PRN

## 2018-03-08 MED ORDER — SUGAMMADEX SODIUM 200 MG/2ML IV SOLN
INTRAVENOUS | Status: DC | PRN
  Administered 2018-03-08 (×2): 100 mg via INTRAVENOUS

## 2018-03-08 MED ORDER — REMIFENTANIL HCL 1 MG IV SOLR
INTRAVENOUS | Status: AC
  Filled 2018-03-08: qty 1000

## 2018-03-08 MED ORDER — LIDOCAINE HCL (CARDIAC) PF 100 MG/5ML IV SOSY
PREFILLED_SYRINGE | INTRAVENOUS | Status: DC | PRN
  Administered 2018-03-08: 50 mg via INTRAVENOUS

## 2018-03-08 MED ORDER — SODIUM CHLORIDE 0.9 % IR SOLN
Status: DC | PRN
  Administered 2018-03-08: 1000 mL

## 2018-03-08 MED ORDER — GLYCOPYRROLATE 0.2 MG/ML IJ SOLN
INTRAMUSCULAR | Status: DC | PRN
  Administered 2018-03-08: 0.2 mg via INTRAVENOUS

## 2018-03-08 MED ORDER — AZELASTINE HCL 0.1 % NA SOLN
2.0000 | Freq: Every day | NASAL | Status: DC | PRN
  Filled 2018-03-08: qty 30

## 2018-03-08 MED ORDER — GLYCOPYRROLATE 0.2 MG/ML IJ SOLN
INTRAMUSCULAR | Status: AC
  Filled 2018-03-08: qty 1

## 2018-03-08 MED ORDER — OXYCODONE HCL 5 MG PO TABS: 5.0000 mg | ORAL_TABLET | Freq: Once | ORAL | Status: DC | PRN

## 2018-03-08 MED ORDER — SENNOSIDES-DOCUSATE SODIUM 8.6-50 MG PO TABS: 1.0000 | ORAL_TABLET | Freq: Every evening | ORAL | Status: DC | PRN

## 2018-03-08 MED ORDER — OXYCODONE HCL 5 MG/5ML PO SOLN: 5.0000 mg | Freq: Once | ORAL | Status: DC | PRN

## 2018-03-08 MED ORDER — SODIUM CHLORIDE 0.9 % IV SOLN
INTRAVENOUS | Status: DC | PRN
  Administered 2018-03-08: 40 ug/min via INTRAVENOUS

## 2018-03-08 MED ORDER — BACITRACIN ZINC 500 UNIT/GM EX OINT
TOPICAL_OINTMENT | CUTANEOUS | Status: AC
  Filled 2018-03-08: qty 28.35

## 2018-03-08 MED ORDER — PROPOFOL 500 MG/50ML IV EMUL
INTRAVENOUS | Status: AC
  Filled 2018-03-08: qty 50

## 2018-03-08 MED ORDER — FENTANYL CITRATE (PF) 100 MCG/2ML IJ SOLN
INTRAMUSCULAR | Status: AC
  Administered 2018-03-08: 50 ug via INTRAVENOUS
  Filled 2018-03-08: qty 2

## 2018-03-08 MED ORDER — PAROXETINE HCL 10 MG PO TABS
10.0000 mg | ORAL_TABLET | Freq: Every day | ORAL | Status: DC
  Administered 2018-03-09: 10 mg via ORAL
  Filled 2018-03-08: qty 1

## 2018-03-08 MED ORDER — PROMETHAZINE HCL 25 MG/ML IJ SOLN: 6.2500 mg | INTRAMUSCULAR | Status: DC | PRN

## 2018-03-08 MED ORDER — METOPROLOL TARTRATE 25 MG PO TABS
25.0000 mg | ORAL_TABLET | Freq: Two times a day (BID) | ORAL | Status: DC
  Administered 2018-03-08 – 2018-03-09 (×2): 25 mg via ORAL
  Filled 2018-03-08 (×2): qty 1

## 2018-03-08 MED ORDER — POTASSIUM CHLORIDE CRYS ER 10 MEQ PO TBCR
10.0000 meq | EXTENDED_RELEASE_TABLET | Freq: Every day | ORAL | Status: DC
  Administered 2018-03-08 – 2018-03-09 (×2): 10 meq via ORAL
  Filled 2018-03-08 (×2): qty 1

## 2018-03-08 MED ORDER — FENTANYL CITRATE (PF) 250 MCG/5ML IJ SOLN
INTRAMUSCULAR | Status: AC
  Filled 2018-03-08: qty 5

## 2018-03-08 MED ORDER — KETAMINE HCL 50 MG/ML IJ SOLN
INTRAMUSCULAR | Status: AC
  Filled 2018-03-08: qty 10

## 2018-03-08 MED ORDER — REMIFENTANIL HCL 1 MG IV SOLR
INTRAVENOUS | Status: DC | PRN
  Administered 2018-03-08: .3 ug/kg/min via INTRAVENOUS

## 2018-03-08 MED ORDER — SODIUM CHLORIDE 0.9 % IV SOLN
INTRAVENOUS | Status: DC
  Administered 2018-03-08 – 2018-03-09 (×2): via INTRAVENOUS

## 2018-03-08 MED ORDER — METHOCARBAMOL 1000 MG/10ML IJ SOLN
500.0000 mg | Freq: Four times a day (QID) | INTRAVENOUS | Status: DC
  Administered 2018-03-08: 500 mg via INTRAVENOUS
  Filled 2018-03-08 (×10): qty 5

## 2018-03-08 MED ORDER — CEFAZOLIN SODIUM-DEXTROSE 2-4 GM/100ML-% IV SOLN
2.0000 g | Freq: Once | INTRAVENOUS | Status: AC
  Administered 2018-03-08: 2 g via INTRAVENOUS

## 2018-03-08 MED ORDER — THROMBIN 5000 UNITS EX SOLR
CUTANEOUS | Status: DC | PRN
  Administered 2018-03-08: 5000 [IU] via TOPICAL

## 2018-03-08 MED ORDER — LISINOPRIL 5 MG PO TABS
5.0000 mg | ORAL_TABLET | Freq: Every evening | ORAL | Status: DC
  Administered 2018-03-08: 5 mg via ORAL
  Filled 2018-03-08: qty 1

## 2018-03-08 MED ORDER — LIDOCAINE HCL (PF) 2 % IJ SOLN
INTRAMUSCULAR | Status: AC
  Filled 2018-03-08: qty 10

## 2018-03-08 MED ORDER — MEPERIDINE HCL 50 MG/ML IJ SOLN: 6.2500 mg | INTRAMUSCULAR | Status: DC | PRN

## 2018-03-08 MED ORDER — THROMBIN 5000 UNITS EX SOLR
CUTANEOUS | Status: AC
  Filled 2018-03-08: qty 10000

## 2018-03-08 MED ORDER — ONDANSETRON HCL 4 MG/2ML IJ SOLN
INTRAMUSCULAR | Status: DC | PRN
  Administered 2018-03-08: 4 mg via INTRAVENOUS

## 2018-03-08 MED ORDER — BACITRACIN 500 UNIT/GM EX OINT
TOPICAL_OINTMENT | CUTANEOUS | Status: DC | PRN
  Administered 2018-03-08: 1 via TOPICAL

## 2018-03-08 MED ORDER — HYDROMORPHONE HCL 1 MG/ML IJ SOLN
INTRAMUSCULAR | Status: AC
  Administered 2018-03-08: 0.5 mg via INTRAVENOUS
  Filled 2018-03-08: qty 1

## 2018-03-08 MED ORDER — MENTHOL 3 MG MT LOZG
1.0000 | LOZENGE | OROMUCOSAL | Status: DC | PRN
  Filled 2018-03-08 (×3): qty 9

## 2018-03-08 MED ORDER — CEFAZOLIN SODIUM-DEXTROSE 2-4 GM/100ML-% IV SOLN
INTRAVENOUS | Status: AC
  Filled 2018-03-08: qty 100

## 2018-03-08 MED ORDER — PROPOFOL 500 MG/50ML IV EMUL
INTRAVENOUS | Status: AC
  Filled 2018-03-08: qty 100

## 2018-03-08 MED ORDER — PANTOPRAZOLE SODIUM 40 MG PO TBEC
40.0000 mg | DELAYED_RELEASE_TABLET | Freq: Every day | ORAL | Status: DC
  Administered 2018-03-09: 40 mg via ORAL
  Filled 2018-03-08: qty 1

## 2018-03-08 MED ORDER — METHYLPREDNISOLONE ACETATE 40 MG/ML IJ SUSP
INTRAMUSCULAR | Status: AC
  Filled 2018-03-08: qty 1

## 2018-03-08 MED ORDER — SODIUM CHLORIDE FLUSH 0.9 % IV SOLN
INTRAVENOUS | Status: AC
  Filled 2018-03-08: qty 10

## 2018-03-08 MED ORDER — ROCURONIUM BROMIDE 100 MG/10ML IV SOLN
INTRAVENOUS | Status: DC | PRN
  Administered 2018-03-08: 30 mg via INTRAVENOUS

## 2018-03-08 MED ORDER — SODIUM CHLORIDE FLUSH 0.9 % IV SOLN
INTRAVENOUS | Status: AC
  Filled 2018-03-08: qty 20

## 2018-03-08 MED ORDER — ACETAMINOPHEN 650 MG RE SUPP: 650.0000 mg | RECTAL | Status: DC | PRN

## 2018-03-08 MED ORDER — ISOSORBIDE MONONITRATE ER 30 MG PO TB24
30.0000 mg | ORAL_TABLET | Freq: Two times a day (BID) | ORAL | Status: DC
  Administered 2018-03-08 – 2018-03-09 (×2): 30 mg via ORAL
  Filled 2018-03-08 (×2): qty 1

## 2018-03-08 MED ORDER — BACITRACIN 50000 UNITS IM SOLR
INTRAMUSCULAR | Status: AC
  Filled 2018-03-08: qty 1

## 2018-03-08 MED ORDER — HYDROMORPHONE HCL 1 MG/ML IJ SOLN: 0.5000 mg | INTRAMUSCULAR | Status: DC | PRN

## 2018-03-08 MED ORDER — FUROSEMIDE 20 MG PO TABS
20.0000 mg | ORAL_TABLET | Freq: Every day | ORAL | Status: DC
  Administered 2018-03-09: 20 mg via ORAL
  Filled 2018-03-08 (×2): qty 1

## 2018-03-08 MED ORDER — LIDOCAINE-EPINEPHRINE 1 %-1:100000 IJ SOLN
INTRAMUSCULAR | Status: AC
  Filled 2018-03-08: qty 1

## 2018-03-08 MED ORDER — HYDROMORPHONE HCL 1 MG/ML IJ SOLN
0.5000 mg | INTRAMUSCULAR | Status: DC | PRN
  Administered 2018-03-08 (×2): 0.5 mg via INTRAVENOUS

## 2018-03-08 MED ORDER — ONDANSETRON HCL 4 MG/2ML IJ SOLN: 4.0000 mg | Freq: Four times a day (QID) | INTRAMUSCULAR | Status: DC | PRN

## 2018-03-08 MED ORDER — FENTANYL CITRATE (PF) 100 MCG/2ML IJ SOLN
25.0000 ug | INTRAMUSCULAR | Status: DC | PRN
  Administered 2018-03-08 (×3): 50 ug via INTRAVENOUS

## 2018-03-08 MED ORDER — ALBUTEROL SULFATE (2.5 MG/3ML) 0.083% IN NEBU: 3.0000 mL | INHALATION_SOLUTION | Freq: Four times a day (QID) | RESPIRATORY_TRACT | Status: DC | PRN

## 2018-03-08 MED ORDER — SUGAMMADEX SODIUM 200 MG/2ML IV SOLN
INTRAVENOUS | Status: AC
  Filled 2018-03-08: qty 2

## 2018-03-08 MED ORDER — PROPOFOL 10 MG/ML IV BOLUS
INTRAVENOUS | Status: DC | PRN
  Administered 2018-03-08: 160 mg via INTRAVENOUS

## 2018-03-08 MED ORDER — ONDANSETRON HCL 4 MG PO TABS: 4.0000 mg | ORAL_TABLET | Freq: Four times a day (QID) | ORAL | Status: DC | PRN

## 2018-03-08 MED ORDER — EPHEDRINE SULFATE 50 MG/ML IJ SOLN
INTRAMUSCULAR | Status: DC | PRN
  Administered 2018-03-08: 10 mg via INTRAVENOUS
  Administered 2018-03-08: 5 mg via INTRAVENOUS
  Administered 2018-03-08: 10 mg via INTRAVENOUS

## 2018-03-08 MED ORDER — KETAMINE HCL 50 MG/ML IJ SOLN
INTRAMUSCULAR | Status: DC | PRN
  Administered 2018-03-08: 50 mg via INTRAVENOUS
  Administered 2018-03-08: 20 mg via INTRAMUSCULAR

## 2018-03-08 MED ORDER — NITROGLYCERIN 0.4 MG SL SUBL: 0.4000 mg | SUBLINGUAL_TABLET | SUBLINGUAL | Status: DC | PRN

## 2018-03-08 SURGICAL SUPPLY — 67 items
BLADE BOVIE TIP EXT 4 (BLADE) IMPLANT
BLADE SURG 15 STRL LF DISP TIS (BLADE) IMPLANT
BLADE SURG 15 STRL SS (BLADE)
BUR NEURO DRILL SOFT 3.0X3.8M (BURR) ×2 IMPLANT
BUR SABER DIAMOND 3.0 (BURR) ×2 IMPLANT
CANISTER SUCT 1200ML W/VALVE (MISCELLANEOUS) ×2 IMPLANT
CHLORAPREP W/TINT 26ML (MISCELLANEOUS) ×2 IMPLANT
COUNTER NEEDLE 20/40 LG (NEEDLE) ×4 IMPLANT
COVER LIGHT HANDLE STERIS (MISCELLANEOUS) ×4 IMPLANT
CRADLE LAMINECT ARM (MISCELLANEOUS) ×2 IMPLANT
CUP MEDICINE 2OZ PLAST GRAD ST (MISCELLANEOUS) ×4 IMPLANT
DERMABOND ADVANCED (GAUZE/BANDAGES/DRESSINGS) ×1
DERMABOND ADVANCED .7 DNX12 (GAUZE/BANDAGES/DRESSINGS) ×1 IMPLANT
DRAPE C-ARM 42X72 X-RAY (DRAPES) ×4 IMPLANT
DRAPE INCISE IOBAN 66X45 STRL (DRAPES) ×2 IMPLANT
DRAPE MICROSCOPE SPINE 48X150 (DRAPES) ×2 IMPLANT
DRAPE POUCH INSTRU U-SHP 10X18 (DRAPES) ×2 IMPLANT
DRAPE SHEET LG 3/4 BI-LAMINATE (DRAPES) ×6 IMPLANT
DRAPE SURG 17X11 SM STRL (DRAPES) ×8 IMPLANT
DRAPE THYROID T SHEET (DRAPES) ×2 IMPLANT
ELECT CAUTERY BLADE TIP 2.5 (TIP)
ELECT EZSTD 165MM 6.5IN (MISCELLANEOUS)
ELECTRODE CAUTERY BLDE TIP 2.5 (TIP) IMPLANT
ELECTRODE EZSTD 165MM 6.5IN (MISCELLANEOUS) IMPLANT
FEE INTRAOP MONITOR IMPULS NCS (MISCELLANEOUS) ×1 IMPLANT
GLOVE BIOGEL PI IND STRL 8 (GLOVE) ×1 IMPLANT
GLOVE BIOGEL PI INDICATOR 8 (GLOVE) ×1
GLOVE SURG SYN 8.0 (GLOVE) ×4 IMPLANT
GOWN STRL REUS W/ TWL LRG LVL3 (GOWN DISPOSABLE) IMPLANT
GOWN STRL REUS W/ TWL XL LVL3 (GOWN DISPOSABLE) ×1 IMPLANT
GOWN STRL REUS W/TWL LRG LVL3 (GOWN DISPOSABLE)
GOWN STRL REUS W/TWL XL LVL3 (GOWN DISPOSABLE) ×1
GRADUATE 1200CC STRL 31836 (MISCELLANEOUS) ×2 IMPLANT
HEMOSTAT SURGICEL 2X3 (HEMOSTASIS) IMPLANT
INTRAOP MONITOR FEE IMPULS NCS (MISCELLANEOUS) ×1
INTRAOP MONITOR FEE IMPULSE (MISCELLANEOUS) ×1
IV CATH ANGIO 12GX3 LT BLUE (NEEDLE) IMPLANT
KIT TURNOVER KIT A (KITS) ×2 IMPLANT
MARKER SKIN DUAL TIP RULER LAB (MISCELLANEOUS) ×6 IMPLANT
NEEDLE HYPO 22GX1.5 SAFETY (NEEDLE) ×2 IMPLANT
NEEDLE SPNL 22GX3.5 QUINCKE BK (NEEDLE) IMPLANT
PACK LAMINECTOMY NEURO (CUSTOM PROCEDURE TRAY) ×2 IMPLANT
PIN CASPAR 14 (PIN) IMPLANT
PIN CASPAR 14MM (PIN)
PIN MAYFIELD SKULL DISP (PIN) ×2 IMPLANT
SOLUTION ELECTROLUBE (MISCELLANEOUS) IMPLANT
SPOGE SURGIFLO 8M (HEMOSTASIS) ×1
SPONGE KITTNER 5P (MISCELLANEOUS) ×2 IMPLANT
SPONGE SURGIFLO 8M (HEMOSTASIS) ×1 IMPLANT
SPONGE XRAY 4X4 16PLY STRL (MISCELLANEOUS) ×2 IMPLANT
STAPLER SKIN PROX 35W (STAPLE) ×2 IMPLANT
SUT BONE WAX W31G (SUTURE) ×2 IMPLANT
SUT ETHILON 3 0 PS 1 (SUTURE) IMPLANT
SUT MNCRL 4-0 (SUTURE) ×1
SUT MNCRL 4-0 27XMFL (SUTURE) ×1
SUT POLYSORB 2-0 5X18 GS-10 (SUTURE) ×4 IMPLANT
SUT PROLENE 5 0 RB 2 (SUTURE) IMPLANT
SUT VIC AB 0 CT1 18XCR BRD 8 (SUTURE) IMPLANT
SUT VIC AB 0 CT1 8-18 (SUTURE)
SUT VICRYL 2-0 SH 8X27 (SUTURE) IMPLANT
SUT VICRYL 3-0 CR8 SH (SUTURE) ×2 IMPLANT
SUTURE MNCRL 4-0 27XMF (SUTURE) ×1 IMPLANT
SYR 30ML LL (SYRINGE) ×2 IMPLANT
TAPE ADH 3 LX (MISCELLANEOUS) ×2 IMPLANT
TOWEL OR 17X26 4PK STRL BLUE (TOWEL DISPOSABLE) ×4 IMPLANT
TRAY FOLEY MTR SLVR 16FR STAT (SET/KITS/TRAYS/PACK) IMPLANT
TUBING CONNECTING 10 (TUBING) ×2 IMPLANT

## 2018-03-08 NOTE — Anesthesia Procedure Notes (Addendum)
Procedure Name: Intubation Date/Time: 03/08/2018 11:00 AM Performed by: Rolla Plate, CRNA Pre-anesthesia Checklist: Patient identified, Patient being monitored, Timeout performed, Emergency Drugs available and Suction available Patient Re-evaluated:Patient Re-evaluated prior to induction Oxygen Delivery Method: Circle system utilized Preoxygenation: Pre-oxygenation with 100% oxygen Induction Type: IV induction Ventilation: Mask ventilation without difficulty and Oral airway inserted - appropriate to patient size Laryngoscope Size: 3 and McGraph Grade View: Grade I Tube type: Oral Tube size: 7.0 mm Number of attempts: 1 Airway Equipment and Method: Stylet Placement Confirmation: ETT inserted through vocal cords under direct vision,  positive ETCO2 and breath sounds checked- equal and bilateral Secured at: 21 cm Tube secured with: Tape Dental Injury: Teeth and Oropharynx as per pre-operative assessment  Comments: Gentle induction with head neck neutral position

## 2018-03-08 NOTE — Op Note (Signed)
Operative Note 03/08/2018  PRE-OP DIAGNOSIS:  Cervical radiculopathy [M54.12]   POST-OP DIAGNOSIS:Post-Op Diagnosis Codes: * Cervical radiculopathy [M54.12] * Synovial Cyst  Procedure(s): Right C7 Hemilaminectomy with Factectomy and Foraminotomy Removal of Synovial Cyst   SURGEON: Surgeon(s) and Role: * Malen Gauze, MD - Primary  Marin Olp, PA, Asisstant  ANESTHESIA:General   OPERATIVE FINDINGS: Stenosis atright C7/T1    PROCEDURE  Indications Ms Hintonwas last seen in clinic on 7/9withongoing right arm pain, weakness,and numbness in a C8 distribution inhibiiting theability to perform daily activities.EMG was performed that saw no peripheral neuropathy.She had a MRI that showed a synovial cyst causing stenosis at right C7/T1.Given this, we recommended a right C7/T1 posterior cervical decompression to relieve the pressure on the nerves.The risks of hematoma, infection, CSF leak, cord injury, weakness, numbness, neck pain, stroke, and death were discussed in detail. We discussed using neuromonitoring to limit risk to spinal cord. All questions were answered and the patient elected to proceed with the surgery.   Procedure After obtaining informed consent, the patient was taken to the Operating Room where general anesthesia was induced and the patient intubated. Vascular access was obtained. Neuromonitoring electrodes were placed. The [place] head holder was applied and the patient positioned prone on padded bed.  After she was secured, baseline MEPs and SSEPs were obtained. Fluoroscopy was used to identify area over C6/7 level to ensure we can see intra-operatively.   The patient was prepped and draped in the usual sterile fashion and a timeout was performed per protocol. Local anesthesia was instilled with epinephrine along the planned incision site. A midline linear incision was started sharply and taken to the fascia. Cautery was used  to dissect musculature on the right side of presumed C6-T1 laminas. Once hemostasis achieved, fluorosopy was used to confirm the correct levels.   Next, the drill was used with 63mm matchstick bit to avoid thermal injury and the right side of C7 lamina not removing the spinous process. The medial facet of C7/T1 was also removed. Once the bone was thinned, a thicker portion with obvious synovial tissue was seen impressing the dura. This was separated with the drill until floating.The caudal portion of C6 and superior portion of T1 was also removed. Next, the ligament was removed medially and followed laterally circumscribing the enlarged facet that was also adherent to the dura. Microscope was brought into the filed for microdissection. The curette was used to separate the dura from the adherent tissue taken care not to injure the dura. The traversing nerve root could be seen and was protected. The curette was followed laterally over the facet to ensure that there was no obvious stenosis.   Neuro-monitoring was periodically checked throughout the case and MEPS/SSEPS remained stable until end of case. Floseal was used for hemostasis and no CSF leak seen. Fluoroscopy was again used to confirm the correct level. Depo-medrol was placed along nerve roots.   The retractors were removed. The wound was irrigated copiously and hemostasis obtained. The fascia was closed with 0 vicryl suture. The dermis was closed with 2-0 Vicryl and staples were placed on the skin.  The patient had general anesthesia reversed and was extubated following the procedure.She awoke following commands with symmetric movement.She was taken to the PACU whereshe continued recovery and then the ward.   ESTIMATED BLOOD LOSS: 50cc  SPECIMENS None  IMPLANT None  I performed the case in its entiretywith assistance of PA, Corrie Mckusick, [place]

## 2018-03-08 NOTE — Anesthesia Post-op Follow-up Note (Signed)
Anesthesia QCDR form completed.        

## 2018-03-08 NOTE — Progress Notes (Signed)
Procedure Posterior cervical decompression C7 Procedure date: 03/08/2018 Diagnosis: right cervical radiculopathy   History: [name] is here for C7 posterior cervical decompression for cervical radiculopathy. Patient seen in recovery still disoriented from anesthesia. Right arm pain and numbness has resolved at this time. 10/10 posterior neck pain. No other new pain, numbness, tingling.   Physical Exam: Vitals:   03/08/18 0913 03/08/18 1412  BP: 107/74 (!) 124/93  Pulse: 86 82  Resp: 18 17  Temp: (!) 96.7 F (35.9 C) (!) 97 F (36.1 C)  SpO2: 100% 98%    AA Ox3 CNI Skin: incision dressed with honeycomb Strength:5/5 throughout Sensory: intact and symmetric  Data:  Recent Labs  Lab 03/02/18 1529  NA 141  K 3.4*  CL 103  CO2 28  BUN 20  CREATININE 1.26*  GLUCOSE 94  CALCIUM 9.5   No results for input(s): AST, ALT, ALKPHOS in the last 168 hours.  Invalid input(s): TBILI   Recent Labs  Lab 03/02/18 1529  WBC 9.5  HGB 15.5  HCT 45.5  PLT 203   Recent Labs  Lab 03/02/18 1529  APTT 30  INR 1.00         Other tests/results: No imaging reviewed  Assessment/Plan:  [name] is POD0 s/p posterior cervical decompression for right cervical radiculopathy. Symptoms prior to surgery seem to be resolved upon evaluation in recovery. Posterior neck pain expected. Pain control with tylenol, robaxin, oxycodone.   mobilize pain control DVT prophylaxis  Marin Olp PA-C Department of Neurosurgery

## 2018-03-08 NOTE — Anesthesia Preprocedure Evaluation (Signed)
Anesthesia Evaluation  Patient identified by MRN, date of birth, ID band Patient awake    Reviewed: Allergy & Precautions, NPO status , Patient's Chart, lab work & pertinent test results  History of Anesthesia Complications Negative for: history of anesthetic complications  Airway Mallampati: II  TM Distance: >3 FB Neck ROM: Full    Dental no notable dental hx.    Pulmonary asthma , sleep apnea and Continuous Positive Airway Pressure Ventilation , COPD,  COPD inhaler, former smoker,    breath sounds clear to auscultation- rhonchi (-) wheezing      Cardiovascular hypertension, Pt. on medications + CAD, + Past MI, + Cardiac Stents, + CABG (1992) and +CHF   Rhythm:Regular Rate:Normal - Systolic murmurs and - Diastolic murmurs Echo 2/54/27: - Left ventricle: The cavity size was normal. Wall thickness was   normal. Systolic function was mildly to moderately reduced. The   estimated ejection fraction was in the range of 40% to 45%.   Doppler parameters are consistent with abnormal left ventricular   relaxation (grade 1 diastolic dysfunction). - Pulmonary arteries: Systolic pressure was within the normal   range.   Neuro/Psych  Headaches, PSYCHIATRIC DISORDERS Depression    GI/Hepatic PUD, GERD  ,(+) Hepatitis -, B  Endo/Other  diabetes (diet controlled)  Renal/GU negative Renal ROS     Musculoskeletal  (+) Arthritis ,   Abdominal (+) + obese,   Peds  Hematology  (+) anemia ,   Anesthesia Other Findings Past Medical History: No date: (HFpEF) heart failure with preserved ejection fraction ([place])     Comment:  a. 06/2016 Echo: EF 50%, no rwma, mild to mod TR. No date: Allergy No date: Anemia No date: Arthritis No date: Asthma No date: Bacterial vaginitis No date: Blood in stool No date: Brachial neuritis or radiculitis NOS 07/2017: Brain tumor (benign) ([place])     Comment:  near optic nerve. being followed by  neurosurgery/eye               doctor and pcp. monitoring size.causes sinus problems No date: Bronchitis No date: Cardiac arrhythmia No date: Cervical neck pain with evidence of disc disease     Comment:  C5/6 disease, MRI done late 2012 - no records available No date: Chronic pain No date: Cocaine abuse, in remission (HCC)     Comment:  clean x 24 years No date: Colon polyps No date: COPD (chronic obstructive pulmonary disease) (HCC)     Comment:  many inhalers No date: Coronary artery disease     Comment:  a. PCI of LCX 2003; b. PCI of the LAD 2012 with a (2.5 x              8 mm BMS);  c.s/p CABG 4/12:  L-LAD, S-Dx, S-OM, S-RCA               (Dr. Prescott Gum);  d. 01/2012 MV: inf infarct, attenuation,              no ischemia. No date: Depression No date: Diabetes mellitus without complication (HCC) No date: Family history of colon cancer No date: Generalized headaches     Comment:  frequent No date: GERD (gastroesophageal reflux disease) No date: Headache No date: Heart murmur No date: Hematochezia No date: Hepatitis     Comment:  history of hepatitis b No date: History of cervical cancer     Comment:  s/p cryotherapy No date: History of drug abuse     Comment:  cocaine,  marijuana, clean since 1989 No date: History of hepatitis B     Comment:  from eating undercooked liver No date: History of MI (myocardial infarction) No date: Hyperlipidemia No date: Hypertension 2003, 2012: Myocardial infarction ([place]) No date: PAD (peripheral artery disease) ([place])     Comment:  s/p Right SFA atherectomy and PTA 01/15/11, normal ABIs               01/2011 No date: Polyp of colon No date: Seasonal allergies No date: Sleep apnea     Comment:  uses cpap No date: Smoking history     Comment:  quit 10/9353 No date: Systolic CHF, chronic (HCC)     Comment:  mild: echo 08/2010 - mildly reduced EF 40-45%, mild               diffuse hypokinesis No date: Thyroid disease No date: Urinary  incontinence No date: Urine incontinence   Reproductive/Obstetrics                            Anesthesia Physical Anesthesia Plan  ASA: III  Anesthesia Plan: General   Post-op Pain Management:    Induction: Intravenous  PONV Risk Score and Plan: 2 and TIVA, Ondansetron and Dexamethasone  Airway Management Planned: Oral ETT  Additional Equipment:   Intra-op Plan:   Post-operative Plan: Extubation in OR  Informed Consent: I have reviewed the patients History and Physical, chart, labs and discussed the procedure including the risks, benefits and alternatives for the proposed anesthesia with the patient or authorized representative who has indicated his/her understanding and acceptance.   Dental advisory given  Plan Discussed with: CRNA and Anesthesiologist  Anesthesia Plan Comments: (Neuromonitoring )        Anesthesia Quick Evaluation

## 2018-03-08 NOTE — Transfer of Care (Signed)
Immediate Anesthesia Transfer of Care Note  Patient: [name]  Procedure(s) Performed: POSTERIOR CERVICAL LAMINECTOMY-C7 (N/A )  Patient Location: PACU  Anesthesia Type:General  Level of Consciousness: awake, oriented, drowsy and patient cooperative  Airway & Oxygen Therapy: Patient Spontanous Breathing and Patient connected to face mask oxygen  Post-op Assessment: Report given to RN, Post -op Vital signs reviewed and stable and Patient moving all extremities  Post vital signs: stable  Last Vitals:  Vitals Value Taken Time  BP 124/93 03/08/2018  2:12 PM  Temp 36.1 C 03/08/2018  2:12 PM  Pulse 87 03/08/2018  2:18 PM  Resp 22 03/08/2018  2:18 PM  SpO2 100 % 03/08/2018  2:18 PM  Vitals shown include unvalidated device data.  Last Pain:  Vitals:   03/08/18 0913  TempSrc: Temporal  PainSc: 10-Worst pain ever         Complications: No apparent anesthesia complications

## 2018-03-08 NOTE — H&P (Addendum)
[name] is an 57 y.o. female.   Chief Complaint: Right hand numbness and weakness  HPI: [name] is here with medial right hand numbness and grip weakness. She has also noted more difficulty writing and holding things. She is right handed. She denies any left arm symptoms.She had an EMG that showed no peripheral neuropathy. She had a CT showing calcified posterior ligament and possible facet causing stenosis. We discussed continuing conservative measures but given the weakness and numbness, surgery is recommended   Past Medical History:  Diagnosis Date  . (HFpEF) heart failure with preserved ejection fraction ([place])    a. 06/2016 Echo: EF 50%, no rwma, mild to mod TR.  [name] Allergy   . Anemia   . Arthritis   . Asthma   . Bacterial vaginitis   . Blood in stool   . Brachial neuritis or radiculitis NOS   . Brain tumor (benign) ([place]) 07/2017   near optic nerve. being followed by neurosurgery/eye doctor and pcp. monitoring size.causes sinus problems  . Bronchitis   . Cardiac arrhythmia   . Cervical neck pain with evidence of disc disease    C5/6 disease, MRI done late 2012 - no records available  . Chronic pain   . Cocaine abuse, in remission ([place])    clean x 24 years  . Colon polyps   . COPD (chronic obstructive pulmonary disease) (HCC)    many inhalers  . Coronary artery disease    a. PCI of LCX 2003; b. PCI of the LAD 2012 with a (2.5 x 8 mm BMS);  c.s/p CABG 4/12:  L-LAD, S-Dx, S-OM, S-RCA (Dr. Prescott Gum);  d. 01/2012 MV: inf infarct, attenuation, no ischemia.  . Depression   . Diabetes mellitus without complication ([place])   . Family history of colon cancer   . Generalized headaches    frequent  . GERD (gastroesophageal reflux disease)   . Headache   . Heart murmur   . Hematochezia   . Hepatitis    history of hepatitis b  . History of cervical cancer    s/p cryotherapy  . History of drug abuse    cocaine, marijuana, clean since 1989  . History of hepatitis B    from  eating undercooked liver  . History of MI (myocardial infarction)   . Hyperlipidemia   . Hypertension   . Myocardial infarction [facility]) 2003, 2012  . PAD (peripheral artery disease) (HCC)    s/p Right SFA atherectomy and PTA 01/15/11, normal ABIs 01/2011  . Polyp of colon   . Seasonal allergies   . Sleep apnea    uses cpap  . Smoking history    quit 07/2010  . Systolic CHF, chronic (HCC)    mild: echo 08/2010 - mildly reduced EF 40-45%, mild diffuse hypokinesis  . Thyroid disease   . Urinary incontinence   . Urine incontinence     Past Surgical History:  Procedure Laterality Date  . ABDOMINAL HYSTERECTOMY     2003  . CHOLECYSTECTOMY  1986  . COLONOSCOPY  2008   3 polyps  . COLONOSCOPY WITH PROPOFOL N/A 02/06/2015   Procedure: COLONOSCOPY WITH PROPOFOL;  Surgeon: Lucilla Lame, MD;  Location: [facility] ENDOSCOPY;  Service: Endoscopy;  Laterality: N/A;  . COLONOSCOPY WITH PROPOFOL N/A 01/27/2017   Procedure: COLONOSCOPY WITH PROPOFOL;  Surgeon: Lucilla Lame, MD;  Location[facility] ENDOSCOPY;  Service: Endoscopy;  Laterality: N/A;  . CORONARY ANGIOPLASTY     w/ stent placement x2  . CORONARY ARTERY  BYPASS GRAFT  2012   Dr [name]  . CORONARY STENT PLACEMENT  2003   S/P MI  . CORONARY STENT PLACEMENT  2007   [place]  . ESOPHAGOGASTRODUODENOSCOPY (EGD) WITH PROPOFOL N/A 02/06/2015   Procedure: ESOPHAGOGASTRODUODENOSCOPY (EGD) WITH PROPOFOL;  Surgeon: Lucilla Lame, MD;  Location: [facility] ENDOSCOPY;  Service: Endoscopy;  Laterality: N/A;  . ESOPHAGOGASTRODUODENOSCOPY (EGD) WITH PROPOFOL N/A 01/27/2017   Procedure: ESOPHAGOGASTRODUODENOSCOPY (EGD) WITH PROPOFOL;  Surgeon: Lucilla Lame, MD;  Location: [facility] ENDOSCOPY;  Service: Endoscopy;  Laterality: N/A;  . FEMORAL ARTERY STENT  10/2010   right sided (Dr. Burt Knack)  . KNEE ARTHROSCOPY WITH MEDIAL MENISECTOMY Right 08/20/2017   Procedure: KNEE ARTHROSCOPY WITH MEDIAL  AND LATERAL MENISECTOMY;  Surgeon: Hessie Knows, MD;  Location: [facility] ORS;  Service:  Orthopedics;  Laterality: Right;  . TUBAL LIGATION      Family History  Problem Relation Age of Onset  . Hypertension Father   . Heart failure Father   . Diabetes Father   . Colon cancer Father 53  . Glaucoma Father   . Cancer Father        colorectal   . Heart disease Father   . Other Father        glaucoma  . Breast cancer Mother 17       breast cancer, late 64's  . Cancer Mother        breast  . Brain cancer Sister   . Arthritis Sister   . Diabetes Sister   . Hypertension Sister   . Kidney disease Sister   . Diabetes Brother   . Hypertension Brother   . Other Sister        brain tumor   . Coronary artery disease Neg Hx   . Stroke Neg Hx    Social History:  reports that she quit smoking about 7 years ago. Her smoking use included cigarettes. She has a 38.00 pack-year smoking history. She has never used smokeless tobacco. She reports that she does not drink alcohol or use drugs.  Allergies:  Allergies  Allergen Reactions  . Latex Rash       . Shellfish Allergy Anaphylaxis    Hard shellfish/swelling in throat  . Sulfonamide Derivatives Anaphylaxis    Swelling in throat.  . Watermelon [Citrullus Vulgaris] Other (See Comments)    Throat itchy  . Other Swelling    Medications Prior to Admission  Medication Sig Dispense Refill  . acetaminophen (TYLENOL) 500 MG tablet Take 1,000 mg by mouth every 6 (six) hours as needed for mild pain or headache.    . albuterol (PROVENTIL HFA;VENTOLIN HFA) 108 (90 Base) MCG/ACT inhaler Inhale 2 puffs into the lungs every 6 (six) hours as needed for wheezing or shortness of breath. 1 Inhaler 3  . aspirin 81 MG tablet Take 1 tablet (81 mg total) by mouth daily. 90 tablet 3  . cyclobenzaprine (FLEXERIL) 5 MG tablet Take 1-2 tablets (5-10 mg total) by mouth 2 (two) times daily as needed for muscle spasms. 180 tablet 1  . esomeprazole (NEXIUM) 40 MG capsule Take 40 mg by mouth daily. Reported on 09/04/2015    . ezetimibe (ZETIA) 10 MG  tablet TAKE ONE TABLET BY MOUTH ONCE DAILY 90 tablet 3  . furosemide (LASIX) 20 MG tablet TAKE ONE TABLET BY MOUTH ONCE DAILY FOR SWELLING. MAY TAKE ONE ADDITIONAL TABLET AS NEEDED FOR SWELLING OR FLUID (Patient taking differently: TAKE ONE TABLET BY MOUTH ONCE DAILY AND ANOTHER DOSE IF NEEDED FOR FLUID BUILD  UP) 180 tablet 3  . isosorbide mononitrate (IMDUR) 30 MG 24 hr tablet TAKE ONE TABLET BY MOUTH TWICE DAILY 180 tablet 3  . KLOR-CON M10 10 MEQ tablet TAKE ONE TABLET BY MOUTH ONCE DAILY 90 tablet 3  . lisinopril (PRINIVIL,ZESTRIL) 5 MG tablet Take 1 tablet (5 mg total) by mouth daily. (Patient taking differently: Take 5 mg by mouth every evening. At 5 pm) 90 tablet 3  . meclizine (ANTIVERT) 25 MG tablet Take 25 mg by mouth 3 (three) times daily.   3  . metoprolol tartrate (LOPRESSOR) 25 MG tablet TAKE ONE TABLET BY MOUTH TWICE DAILY (Patient taking differently: Take 25 mg by mouth 2 (two) times daily. ) 180 tablet 3  . nitroGLYCERIN (NITROSTAT) 0.4 MG SL tablet Place 1 tablet (0.4 mg total) under the tongue every 5 (five) minutes as needed. (Patient taking differently: Place 0.4 mg under the tongue every 5 (five) minutes as needed for chest pain. ) 25 tablet 6  . oxyCODONE-acetaminophen (PERCOCET) 10-325 MG tablet Take 1 tablet by mouth every 8 (eight) hours as needed for pain. 15 tablet 0  . PARoxetine (PAXIL) 10 MG tablet Take 10 mg by mouth daily.    . rivaroxaban (XARELTO) 2.5 MG TABS tablet Take 1 tablet (2.5 mg total) by mouth 2 (two) times daily. 60 tablet 11  . rosuvastatin (CRESTOR) 20 MG tablet Take 20 mg by mouth daily.     [name] umeclidinium-vilanterol (ANORO ELLIPTA) 62.5-25 MCG/INH AEPB Inhale 1 puff into the lungs daily. 60 each 10  . vitamin B-12 (CYANOCOBALAMIN) 1000 MCG tablet Take 1,000 mcg by mouth daily.    . Azelastine HCl 0.15 % SOLN Place 2 sprays into both nostrils daily as needed (allergies).     . fluticasone (FLONASE) 50 MCG/ACT nasal spray Place 2 sprays into both  nostrils daily as needed for allergies.       Results for orders placed or performed during the hospital encounter of 03/08/18 (from the past 48 hour(s))  Glucose, capillary     Status: Abnormal   Collection Time: 03/08/18  9:06 AM  Result Value Ref Range   Glucose-Capillary 115 (H) 70 - 99 mg/dL  ABO/Rh     Status: None   Collection Time: 03/08/18  9:16 AM  Result Value Ref Range   ABO/RH(D)      B POS Performed at [facility], [address], [place], [place] [address]    No results found.  ROS General ROS: Negative Respiratory ROS: Negative Cardiovascular ROS: Negative Gastrointestinal ROS: Negative Genito-Urinary ROS: Negative Musculoskeletal ROS: Negative Neurological ROS: Positive for right hand numbness, arm pain Dermatological ROS: Negative  Blood pressure 107/74, pulse 86, temperature (!) 96.7 F (35.9 C), temperature source Temporal, resp. rate 18, SpO2 100 %. Physical Exam  General appearance: Alert, cooperative, in no acute distress Head: Normocephalic CV: Regular rate and rhythm Pulm: Clear to auscultation   Neurologic exam:  Mental status: alertness: alert, affect: normal Speech: fluent and clear Cranial nerves:  V/VII:no evidence of facial droop or weakness  Motor:strength symmetric 5/5 in bilateral upper extremities in all motor groups except 4+/5 in IO on right Sensory: decrease to light touch over medial right hand and digits   CT Cervical Spine: 1. Stable appearance of foraminal narrowing bilaterally at C5-6 and C6-7 and right foraminal narrowing at C7-T1.  MRI Cervical Spine: 1. Progressive disc degeneration at C5-6 and C6-7 resulting in increased neural foraminal stenosis, moderate at C5-6. 2. Mild spinal stenosis at C5-6.  3. Progressive, severe right facet arthritis at C7-T1 with associated edema and mild right neural foraminal stenosis.  Assessment/Plan Plan for right C7/T1 decompression  Deetta Perla, MD 03/08/2018, 9:51  AM    Patient seen in PACU, procedure explained, ready to proceed

## 2018-03-08 NOTE — OR Nursing (Signed)
Patient had a hx of cocaine use 25 years ago, denies any recent use.  Dr Randa Lynn aware, no UDS ordered.

## 2018-03-09 ENCOUNTER — Encounter: Payer: Self-pay | Admitting: Neurosurgery

## 2018-03-09 DIAGNOSIS — Z7901 Long term (current) use of anticoagulants: Secondary | ICD-10-CM | POA: Diagnosis not present

## 2018-03-09 DIAGNOSIS — M7130 Other bursal cyst, unspecified site: Secondary | ICD-10-CM | POA: Diagnosis present

## 2018-03-09 DIAGNOSIS — J449 Chronic obstructive pulmonary disease, unspecified: Secondary | ICD-10-CM | POA: Diagnosis present

## 2018-03-09 DIAGNOSIS — Z91040 Latex allergy status: Secondary | ICD-10-CM | POA: Diagnosis not present

## 2018-03-09 DIAGNOSIS — Z79899 Other long term (current) drug therapy: Secondary | ICD-10-CM | POA: Diagnosis not present

## 2018-03-09 DIAGNOSIS — G4730 Sleep apnea, unspecified: Secondary | ICD-10-CM | POA: Diagnosis present

## 2018-03-09 DIAGNOSIS — Z91013 Allergy to seafood: Secondary | ICD-10-CM | POA: Diagnosis not present

## 2018-03-09 DIAGNOSIS — Z951 Presence of aortocoronary bypass graft: Secondary | ICD-10-CM | POA: Diagnosis not present

## 2018-03-09 DIAGNOSIS — Z7982 Long term (current) use of aspirin: Secondary | ICD-10-CM | POA: Diagnosis not present

## 2018-03-09 DIAGNOSIS — Z91018 Allergy to other foods: Secondary | ICD-10-CM | POA: Diagnosis not present

## 2018-03-09 DIAGNOSIS — I5032 Chronic diastolic (congestive) heart failure: Secondary | ICD-10-CM | POA: Diagnosis present

## 2018-03-09 DIAGNOSIS — M5412 Radiculopathy, cervical region: Secondary | ICD-10-CM | POA: Diagnosis present

## 2018-03-09 DIAGNOSIS — I110 Hypertensive heart disease with heart failure: Secondary | ICD-10-CM | POA: Diagnosis present

## 2018-03-09 DIAGNOSIS — Z87891 Personal history of nicotine dependence: Secondary | ICD-10-CM | POA: Diagnosis not present

## 2018-03-09 DIAGNOSIS — I2510 Atherosclerotic heart disease of native coronary artery without angina pectoris: Secondary | ICD-10-CM | POA: Diagnosis present

## 2018-03-09 DIAGNOSIS — M4804 Spinal stenosis, thoracic region: Secondary | ICD-10-CM | POA: Diagnosis present

## 2018-03-09 DIAGNOSIS — E1151 Type 2 diabetes mellitus with diabetic peripheral angiopathy without gangrene: Secondary | ICD-10-CM | POA: Diagnosis present

## 2018-03-09 DIAGNOSIS — Z955 Presence of coronary angioplasty implant and graft: Secondary | ICD-10-CM | POA: Diagnosis not present

## 2018-03-09 DIAGNOSIS — I252 Old myocardial infarction: Secondary | ICD-10-CM | POA: Diagnosis not present

## 2018-03-09 DIAGNOSIS — Z9109 Other allergy status, other than to drugs and biological substances: Secondary | ICD-10-CM | POA: Diagnosis not present

## 2018-03-09 DIAGNOSIS — Z90710 Acquired absence of both cervix and uterus: Secondary | ICD-10-CM | POA: Diagnosis not present

## 2018-03-09 MED ORDER — CYCLOBENZAPRINE HCL 5 MG PO TABS: 5.0000 mg | ORAL_TABLET | Freq: Two times a day (BID) | ORAL | 0 refills | Status: DC | PRN

## 2018-03-09 MED ORDER — SENNOSIDES-DOCUSATE SODIUM 8.6-50 MG PO TABS: 1.0000 | ORAL_TABLET | Freq: Two times a day (BID) | ORAL | 0 refills | Status: DC

## 2018-03-09 MED ORDER — OXYCODONE HCL 5 MG PO TABS: 5.0000 mg | ORAL_TABLET | Freq: Four times a day (QID) | ORAL | 0 refills | Status: DC | PRN

## 2018-03-09 NOTE — Discharge Summary (Signed)
Physician Discharge Summary  Patient ID: [name] MRN: [id number] DOB/AGE: 59-Oct-1962 59 y.o.  Admit date: 03/08/2018 Discharge date: 03/09/2018  Admission Diagnoses: Cervical radiculopathy  Discharge Diagnoses:  Active Problems:   Cervical radiculopathy   Discharged Condition: stable  Hospital Course: [name] was taken to surgery on 8/12 for the cervical posterior decompression. There were no complications and she was taken to the PACU and then ward for post-operative care. There, she was at improved neurologic condition with decreased numbness and increased strength in right hand. Overnight, there were no events. She ambulated, voided, and tolerated food. On POD#1, she was doing well with good pain control in neck. Recommendation was for discharge home. She was discharged 8/13.  Consults: None  Significant Diagnostic Studies: None  Treatments: surgery: Right C7 hemilaminectomy  Discharge Exam: Blood pressure 127/79, pulse 64, temperature 98.7 F (37.1 C), temperature source Oral, resp. rate 19, SpO2 98 %. Neurologic: 5/5 strength in bilateral upper extremities, sensation with mild decrease over medial hand and 5th finger, improved from baseline  Dressing with mild saturation.   Disposition: Discharge disposition: 01-Home or Self Care       Discharge Instructions    Diet - low sodium heart healthy   Complete by:  As directed    Increase activity slowly   Complete by:  As directed      Allergies as of 03/09/2018      Reactions   Latex Rash      Shellfish Allergy Anaphylaxis   Hard shellfish/swelling in throat   Sulfonamide Derivatives Anaphylaxis   Swelling in throat.   Watermelon [citrullus Vulgaris] Other (See Comments)   Throat itchy   Other Swelling      Medication List    STOP taking these medications   acetaminophen 500 MG tablet Commonly known as:  TYLENOL   oxyCODONE-acetaminophen 10-325 MG tablet Commonly known as:  PERCOCET     TAKE  these medications   albuterol 108 (90 Base) MCG/ACT inhaler Commonly known as:  PROVENTIL HFA;VENTOLIN HFA Inhale 2 puffs into the lungs every 6 (six) hours as needed for wheezing or shortness of breath.   aspirin 81 MG tablet Take 1 tablet (81 mg total) by mouth daily.   Azelastine HCl 0.15 % Soln Place 2 sprays into both nostrils daily as needed (allergies).   cyclobenzaprine 5 MG tablet Commonly known as:  FLEXERIL Take 1 tablet (5 mg total) by mouth 2 (two) times daily as needed for muscle spasms. What changed:  how much to take   esomeprazole 40 MG capsule Commonly known as:  NEXIUM Take 40 mg by mouth daily. Reported on 09/04/2015   ezetimibe 10 MG tablet Commonly known as:  ZETIA TAKE ONE TABLET BY MOUTH ONCE DAILY   fluticasone 50 MCG/ACT nasal spray Commonly known as:  FLONASE Place 2 sprays into both nostrils daily as needed for allergies.   furosemide 20 MG tablet Commonly known as:  LASIX TAKE ONE TABLET BY MOUTH ONCE DAILY FOR SWELLING. MAY TAKE ONE ADDITIONAL TABLET AS NEEDED FOR SWELLING OR FLUID What changed:  See the new instructions.   isosorbide mononitrate 30 MG 24 hr tablet Commonly known as:  IMDUR TAKE ONE TABLET BY MOUTH TWICE DAILY   KLOR-CON M10 10 MEQ tablet Generic drug:  potassium chloride TAKE ONE TABLET BY MOUTH ONCE DAILY   lisinopril 5 MG tablet Commonly known as:  PRINIVIL,ZESTRIL Take 1 tablet (5 mg total) by mouth daily. What changed:    when to take this  additional instructions   meclizine 25 MG tablet Commonly known as:  ANTIVERT Take 25 mg by mouth 3 (three) times daily.   metoprolol tartrate 25 MG tablet Commonly known as:  LOPRESSOR TAKE ONE TABLET BY MOUTH TWICE DAILY What changed:    how much to take  how to take this  when to take this  additional instructions   nitroGLYCERIN 0.4 MG SL tablet Commonly known as:  NITROSTAT Place 1 tablet (0.4 mg total) under the tongue every 5 (five) minutes as  needed. What changed:  reasons to take this   oxyCODONE 5 MG immediate release tablet Commonly known as:  Oxy IR/ROXICODONE Take 1 tablet (5 mg total) by mouth every 6 (six) hours as needed for severe pain (1 tablet for 4-8/10 pain, 2 tabs for 9-10/10).   PARoxetine 10 MG tablet Commonly known as:  PAXIL Take 10 mg by mouth daily.   rivaroxaban 2.5 MG Tabs tablet Commonly known as:  XARELTO Take 1 tablet (2.5 mg total) by mouth 2 (two) times daily.   rosuvastatin 20 MG tablet Commonly known as:  CRESTOR Take 20 mg by mouth daily.   senna-docusate 8.6-50 MG tablet Commonly known as:  Senokot-S Take 1 tablet by mouth 2 (two) times daily.   umeclidinium-vilanterol 62.5-25 MCG/INH Aepb Commonly known as:  ANORO ELLIPTA Inhale 1 puff into the lungs daily.   vitamin B-12 1000 MCG tablet Commonly known as:  CYANOCOBALAMIN Take 1,000 mcg by mouth daily.        Signed: Deetta Perla 03/09/2018, 8:17 AM

## 2018-03-09 NOTE — Care Management Obs Status (Signed)
[facility] NOTIFICATION   Patient Details  Name: [name] MRN: [id number] Date of Birth: July 01, 1961   Medicare Observation Status Notification Given:  Yes    Jolly Mango, RN 03/09/2018, 9:00 AM

## 2018-03-09 NOTE — Discharge Instructions (Signed)
NEUROSURGERY DISCHARGE INSTRUCTIONS  Admission diagnosis: cervical radiculopathy,right upper extremity weakness  Operative procedure: Right C7 Hemilaminectomy  What to do after you leave the hospital:  Recommended diet: cardiac diet. Increase protein intake to promote wound healing.  Recommended activity: no lifting, driving, or strenuous exercise for 2 weeks. No driving for 2 weeks.  Wear soft collar for comfort, OK to remove as needed and for showers. Walk every day  Special Instructions  No straining, no heavy lifting > 10lbs x 2 weeks.  Keep incision area clean and dry.   May Shower starting tomorrow and keep dressing off/incision open to air.  No bath or soaking in tub for6weeks.Staples to be removed in 14 days.   Wash incision daily, dry it completely when getting out of shower. Avoid touching incision   Take Tylenol daily for pain, take Oxycodone and muscle relaxant as needed. Take stool softener while on pain meds  **Do not start aspirin or blood thinner for 7 days after surgery (start 8/19)  Please Report any of the following: Nausea or Vomiting, Temperature is greater than 101.41F (38.1C) degrees, Dizziness, Abdominal Pain, Difficulty Breathing or Shortness of Breath, Inability to Eat, drink Fluids, or Take medications, Bleeding, swelling, or drainage from surgical incision sites, New numbness or weakness, Seizures, Altered mental status and Bowel or bladder dysfunction to the neurosurgery resident on-call at [phone].  Additional Follow up appointments Please follow up with Dr Jonathon Jordan office as scheduled in 2 weeks    Please see below for scheduled appointments:  Future Appointments  Date Time Provider [place]  04/05/2018 10:00 AM [name], [name], MD [id number]

## 2018-03-09 NOTE — Progress Notes (Signed)
Pt ambulated around nursing station once wearing collar.

## 2018-03-09 NOTE — Progress Notes (Signed)
Discharge instructions and med details reviewed with patient. All questions answered. Printed prescription and AVS given to patient. Patient verbalizes unsderstanding IV removed.  Patient escorted out via wheelchair.

## 2018-03-10 NOTE — Anesthesia Postprocedure Evaluation (Signed)
Anesthesia Post Note  Patient: [name]  Procedure(s) Performed: POSTERIOR CERVICAL LAMINECTOMY-C7 (N/A )  Patient location during evaluation: PACU Anesthesia Type: General Level of consciousness: awake and alert Pain management: pain level controlled Vital Signs Assessment: post-procedure vital signs reviewed and stable Respiratory status: spontaneous breathing, nonlabored ventilation, respiratory function stable and patient connected to nasal cannula oxygen Cardiovascular status: blood pressure returned to baseline and stable Postop Assessment: no apparent nausea or vomiting Anesthetic complications: no     Last Vitals:  Vitals:   03/09/18 0813 03/09/18 1147  BP: 127/79 (!) 142/84  Pulse: 64 66  Resp:    Temp: 37.1 C 37.2 C  SpO2: 98% 98%    Last Pain:  Vitals:   03/09/18 1147  TempSrc: Oral  PainSc:                  Alphonsus Sias

## 2018-03-12 NOTE — Addendum Note (Signed)
Addendum  created 03/12/18 0826 by Doreen Salvage, CRNA   Charge Capture section accepted

## 2018-04-05 ENCOUNTER — Encounter: Payer: Self-pay | Admitting: Internal Medicine

## 2018-04-05 ENCOUNTER — Ambulatory Visit (INDEPENDENT_AMBULATORY_CARE_PROVIDER_SITE_OTHER): Payer: Medicare HMO | Admitting: Internal Medicine

## 2018-04-05 VITALS — BP 120/70 | HR 64 | Temp 97.8°F | Ht 65.0 in | Wt 216.0 lb

## 2018-04-05 DIAGNOSIS — I2510 Atherosclerotic heart disease of native coronary artery without angina pectoris: Secondary | ICD-10-CM

## 2018-04-05 DIAGNOSIS — Z23 Encounter for immunization: Secondary | ICD-10-CM | POA: Diagnosis not present

## 2018-04-05 DIAGNOSIS — F32A Depression, unspecified: Secondary | ICD-10-CM

## 2018-04-05 DIAGNOSIS — I10 Essential (primary) hypertension: Secondary | ICD-10-CM | POA: Diagnosis not present

## 2018-04-05 DIAGNOSIS — I25118 Atherosclerotic heart disease of native coronary artery with other forms of angina pectoris: Secondary | ICD-10-CM | POA: Diagnosis not present

## 2018-04-05 DIAGNOSIS — F419 Anxiety disorder, unspecified: Secondary | ICD-10-CM | POA: Diagnosis not present

## 2018-04-05 DIAGNOSIS — R69 Illness, unspecified: Secondary | ICD-10-CM | POA: Diagnosis not present

## 2018-04-05 DIAGNOSIS — F329 Major depressive disorder, single episode, unspecified: Secondary | ICD-10-CM

## 2018-04-05 DIAGNOSIS — I739 Peripheral vascular disease, unspecified: Secondary | ICD-10-CM | POA: Diagnosis not present

## 2018-04-05 DIAGNOSIS — Z951 Presence of aortocoronary bypass graft: Secondary | ICD-10-CM

## 2018-04-05 DIAGNOSIS — E782 Mixed hyperlipidemia: Secondary | ICD-10-CM | POA: Diagnosis not present

## 2018-04-05 DIAGNOSIS — E559 Vitamin D deficiency, unspecified: Secondary | ICD-10-CM | POA: Diagnosis not present

## 2018-04-05 DIAGNOSIS — R7303 Prediabetes: Secondary | ICD-10-CM

## 2018-04-05 DIAGNOSIS — E119 Type 2 diabetes mellitus without complications: Secondary | ICD-10-CM | POA: Diagnosis not present

## 2018-04-05 HISTORY — DX: Depression, unspecified: F32.A

## 2018-04-05 HISTORY — DX: Anxiety disorder, unspecified: F41.9

## 2018-04-05 LAB — MICROALBUMIN / CREATININE URINE RATIO
CREATININE, U: 11.1 mg/dL
MICROALB/CREAT RATIO: 6.3 mg/g (ref 0.0–30.0)
Microalb, Ur: <0.7 mg/dL (ref 0.0–1.9)

## 2018-04-05 LAB — URINALYSIS, ROUTINE W REFLEX MICROSCOPIC
BILIRUBIN URINE: NEGATIVE
HGB URINE DIPSTICK: NEGATIVE
Ketones, ur: NEGATIVE
LEUKOCYTES UA: NEGATIVE
NITRITE: NEGATIVE
RBC / HPF: NONE SEEN (ref 0–?)
Specific Gravity, Urine: 1.01 (ref 1.000–1.030)
TOTAL PROTEIN, URINE-UPE24: NEGATIVE
Urine Glucose: NEGATIVE
Urobilinogen, UA: 0.2 (ref 0.0–1.0)
WBC, UA: NONE SEEN (ref 0–?)
pH: 6 (ref 5.0–8.0)

## 2018-04-05 MED ORDER — LANCETS MISC: 1.0000 | Freq: Every day | 3 refills | Status: DC

## 2018-04-05 MED ORDER — PAROXETINE HCL 10 MG PO TABS: 10.0000 mg | ORAL_TABLET | Freq: Every day | ORAL | 11 refills | Status: DC

## 2018-04-05 MED ORDER — GLUCOSE BLOOD VI STRP: ORAL_STRIP | 12 refills | Status: DC

## 2018-04-05 NOTE — Progress Notes (Signed)
Chief Complaint  Patient presents with  . Follow-up   F/u with sister Valorie 1. S/p post cervical laminectomy 03/08/18 Dr. Remo Lipps cook doing well numbness in arms resolved surgery went well  2. DM 2 needs refills of strips and lancets will check A1C today with other labs  3. CAD/PAD check lipid today   Review of Systems  Constitutional: Negative for weight loss.  HENT: Negative for hearing loss.   Eyes: Negative for blurred vision.  Respiratory: Negative for shortness of breath.   Cardiovascular: Negative for chest pain.  Gastrointestinal: Negative for abdominal pain.  Musculoskeletal: Negative for neck pain.  Skin: Negative for rash.  Neurological: Negative for sensory change.  Psychiatric/Behavioral: Negative for depression.   Past Medical History:  Diagnosis Date  . (HFpEF) heart failure with preserved ejection fraction ([place])    a. 06/2016 Echo: EF 50%, no rwma, mild to mod TR.  [name] Allergy   . Anemia   . Arthritis   . Asthma   . Bacterial vaginitis   . Blood in stool   . Brachial neuritis or radiculitis NOS   . 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 being followed by neurosurgery/eye doctor and pcp. monitoring size.causes sinus problems  . Bronchitis   . Cardiac arrhythmia   . Cervical neck pain with evidence of disc disease    C5/6 disease, MRI done late 2012 - no records available  . Chronic pain   . Cocaine abuse, in remission ([place])    clean x 24 years  . Colon polyps   . COPD (chronic obstructive pulmonary disease) (HCC)    many inhalers  . Coronary artery disease    a. PCI of LCX 2003; b. PCI of the LAD 2012 with a (2.5 x 8 mm BMS);  c.s/p CABG 4/12:  L-LAD, S-Dx, S-OM, S-RCA (Dr. Prescott Gum);  d. 01/2012 MV: inf infarct, attenuation, no ischemia.  . Depression   . Diabetes mellitus without complication ([place])   . Family history of colon cancer   . Generalized headaches    frequent  . GERD (gastroesophageal reflux disease)   . Headache   .  Heart murmur   . Hematochezia   . Hepatitis    history of hepatitis b  . History of cervical cancer    s/p cryotherapy  . History of drug abuse    cocaine, marijuana, clean since 1989  . History of hepatitis B    from eating undercooked liver  . History of MI (myocardial infarction)   . Hyperlipidemia   . Hypertension   . Myocardial infarction [facility]) 2003, 2012  . PAD (peripheral artery disease) (HCC)    s/p Right SFA atherectomy and PTA 01/15/11, normal ABIs 01/2011  . Polyp of colon   . Seasonal allergies   . Sleep apnea    uses cpap  . Smoking history    quit 07/2010  . Systolic CHF, chronic (HCC)    mild: echo 08/2010 - mildly reduced EF 40-45%, mild diffuse hypokinesis  . Thyroid disease   . Urinary incontinence   . Urine incontinence    Past Surgical History:  Procedure Laterality Date  . ABDOMINAL HYSTERECTOMY     2003  . CHOLECYSTECTOMY  1986  . COLONOSCOPY  2008   3 polyps  . COLONOSCOPY WITH PROPOFOL N/A 02/06/2015   Procedure: COLONOSCOPY WITH PROPOFOL;  Surgeon: Lucilla Lame, MD;  Location: [facility] ENDOSCOPY;  Service: Endoscopy;  Laterality: N/A;  . COLONOSCOPY WITH PROPOFOL N/A 01/27/2017  Procedure: COLONOSCOPY WITH PROPOFOL;  Surgeon: Lucilla Lame, MD;  Location: [facility] ENDOSCOPY;  Service: Endoscopy;  Laterality: N/A;  . CORONARY ANGIOPLASTY     w/ stent placement x2  . CORONARY ARTERY BYPASS GRAFT  2012   Dr [name]  . CORONARY STENT PLACEMENT  2003   S/P MI  . CORONARY STENT PLACEMENT  2007   [place]  . ESOPHAGOGASTRODUODENOSCOPY (EGD) WITH PROPOFOL N/A 02/06/2015   Procedure: ESOPHAGOGASTRODUODENOSCOPY (EGD) WITH PROPOFOL;  Surgeon: Lucilla Lame, MD;  Location: [facility] ENDOSCOPY;  Service: Endoscopy;  Laterality: N/A;  . ESOPHAGOGASTRODUODENOSCOPY (EGD) WITH PROPOFOL N/A 01/27/2017   Procedure: ESOPHAGOGASTRODUODENOSCOPY (EGD) WITH PROPOFOL;  Surgeon: Lucilla Lame, MD;  Location: [facility] ENDOSCOPY;  Service: Endoscopy;  Laterality: N/A;  . FEMORAL ARTERY STENT  10/2010    right sided (Dr. Burt Knack)  . KNEE ARTHROSCOPY WITH MEDIAL MENISECTOMY Right 08/20/2017   Procedure: KNEE ARTHROSCOPY WITH MEDIAL  AND LATERAL MENISECTOMY;  Surgeon: Hessie Knows, MD;  Location: [facility] ORS;  Service: Orthopedics;  Laterality: Right;  . POSTERIOR CERVICAL LAMINECTOMY N/A 03/08/2018   Procedure: POSTERIOR CERVICAL LAMINECTOMY-C7;  Surgeon: Deetta Perla, MD;  Location: [facility] ORS;  Service: Neurosurgery;  Laterality: N/A;  . TUBAL LIGATION     Family History  Problem Relation Age of Onset  . Hypertension Father   . Heart failure Father   . Diabetes Father   . Colon cancer Father 65  . Glaucoma Father   . Cancer Father        colorectal   . Heart disease Father   . Other Father        glaucoma  . Breast cancer Mother 38       breast cancer, late 1's  . Cancer Mother        breast  . Brain cancer Sister   . Arthritis Sister   . Diabetes Sister   . Hypertension Sister   . Kidney disease Sister   . Diabetes Brother   . Hypertension Brother   . Other Sister        brain tumor   . Coronary artery disease Neg Hx   . Stroke Neg Hx    Social History   Socioeconomic History  . Marital status: Legally Separated    Spouse name: Not on file  . Number of children: Not on file  . Years of education: Not on file  . Highest education level: Not on file  Occupational History  . Not on file  Social Needs  . Financial resource strain: Not on file  . Food insecurity:    Worry: Not on file    Inability: Not on file  . Transportation needs:    Medical: Not on file    Non-medical: Not on file  Tobacco Use  . Smoking status: Former Smoker    Packs/day: 1.00    Years: 38.00    Pack years: 38.00    Types: Cigarettes    Last attempt to quit: 08/12/2010    Years since quitting: 7.6  . Smokeless tobacco: Never Used  Substance and Sexual Activity  . Alcohol use: No  . Drug use: No    Types: Cocaine    Comment: Remote Hx (crack cocaine and marijuana)  . Sexual activity: Yes    Lifestyle  . Physical activity:    Days per week: Not on file    Minutes per session: Not on file  . Stress: Not on file  Relationships  . Social connections:    Talks on phone: Not  on file    Gets together: Not on file    Attends religious service: Not on file    Active member of club or organization: Not on file    Attends meetings of clubs or organizations: Not on file    Relationship status: Not on file  . Intimate partner violence:    Fear of current or ex partner: Not on file    Emotionally abused: Not on file    Physically abused: Not on file    Forced sexual activity: Not on file  Other Topics Concern  . 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 albuterol (PROVENTIL HFA;VENTOLIN HFA) 108 (90 Base) MCG/ACT inhaler Inhale 2 puffs into the lungs every 6 (six) hours as needed for wheezing or shortness of breath.  [name] aspirin 81 MG tablet Take 1 tablet (81 mg total) by mouth daily.  . Azelastine HCl 0.15 % SOLN Place 2 sprays into both nostrils daily as needed (allergies).   . cyclobenzaprine (FLEXERIL) 5 MG tablet Take 1 tablet (5 mg total) by mouth 2 (two) times daily as needed for muscle spasms.  [name] esomeprazole (NEXIUM) 40 MG capsule Take 40 mg by mouth daily. Reported on 09/04/2015  . ezetimibe (ZETIA) 10 MG tablet TAKE ONE TABLET BY MOUTH ONCE DAILY  . fluticasone (FLONASE) 50 MCG/ACT nasal spray Place 2 sprays into both nostrils daily as needed for allergies.   . furosemide (LASIX) 20 MG tablet TAKE ONE TABLET BY MOUTH ONCE DAILY FOR SWELLING. MAY TAKE ONE ADDITIONAL TABLET AS NEEDED FOR SWELLING OR FLUID  (Patient taking differently: TAKE ONE TABLET BY MOUTH ONCE DAILY AND ANOTHER DOSE IF NEEDED FOR FLUID BUILD UP)  . isosorbide mononitrate (IMDUR) 30 MG 24 hr tablet TAKE ONE TABLET BY MOUTH TWICE DAILY  . KLOR-CON M10 10 MEQ tablet TAKE ONE TABLET BY MOUTH ONCE DAILY  . lisinopril (PRINIVIL,ZESTRIL) 5 MG tablet Take 1 tablet (5 mg total) by mouth daily. (Patient taking differently: Take 5 mg by mouth every evening. At 5 pm)  . meclizine (ANTIVERT) 25 MG tablet Take 25 mg by mouth 3 (three) times daily.   . metoprolol tartrate (LOPRESSOR) 25 MG tablet TAKE ONE TABLET BY MOUTH TWICE DAILY (Patient taking differently: Take 25 mg by mouth 2 (two) times daily. )  . nitroGLYCERIN (NITROSTAT) 0.4 MG SL tablet Place 1 tablet (0.4 mg total) under the tongue every 5 (five) minutes as needed. (Patient taking differently: Place 0.4 mg under the tongue every 5 (five) minutes as needed for chest pain. )  . oxyCODONE (ROXICODONE) 5 MG immediate release tablet Take 1 tablet (5 mg total) by mouth every 6 (six) hours as needed for severe pain (1 tablet for 4-8/10 pain, 2 tabs for 9-10/10).  [name] PARoxetine (PAXIL) 10 MG tablet Take 10 mg by mouth daily.  . rivaroxaban (XARELTO) 2.5 MG TABS tablet Take 1 tablet (2.5 mg total) by mouth 2 (two) times daily.  . rosuvastatin (CRESTOR) 20 MG tablet Take 20 mg by mouth daily.   [name]  senna-docusate (SENOKOT-S) 8.6-50 MG tablet Take 1 tablet by mouth 2 (two) times daily.  [name] umeclidinium-vilanterol (ANORO ELLIPTA) 62.5-25 MCG/INH AEPB Inhale 1 puff into the lungs daily.  . vitamin B-12 (CYANOCOBALAMIN) 1000 MCG tablet Take 1,000 mcg by mouth daily.   Allergies  Allergen Reactions  . Latex Rash       . Shellfish Allergy Anaphylaxis    Hard shellfish/swelling in throat  . Sulfonamide Derivatives Anaphylaxis    Swelling in throat.  . Watermelon [Citrullus Vulgaris] Other (See Comments)    Throat itchy  . Other Swelling   Recent Results (from the past 2160 hour(s))  CBC      Status: None   Collection Time: 03/02/18  3:29 PM  Result Value Ref Range   WBC 9.5 3.6 - 11.0 K/uL   RBC 4.80 3.80 - 5.20 MIL/uL   Hemoglobin 15.5 12.0 - 16.0 g/dL   HCT 45.5 35.0 - 47.0 %   MCV 94.7 80.0 - 100.0 fL   MCH 32.3 26.0 - 34.0 pg   MCHC 34.1 32.0 - 36.0 g/dL   RDW 13.4 11.5 - 14.5 %   Platelets 203 150 - 440 K/uL    Comment: Performed at [facility], [address] [place], [place] [address]  Differential     Status: Abnormal   Collection Time: 03/02/18  3:29 PM  Result Value Ref Range   Neutrophils Relative % 79 %   Neutro Abs 7.6 (H) 1.4 - 6.5 K/uL   Lymphocytes Relative 18 %   Lymphs Abs 1.7 1.0 - 3.6 K/uL   Monocytes Relative 2 %   Monocytes Absolute 0.2 0.2 - 0.9 K/uL   Eosinophils Relative 0 %   Eosinophils Absolute 0.0 0 - 0.7 K/uL   Basophils Relative 1 %   Basophils Absolute 0.1 0 - 0.1 K/uL    Comment: Performed at [facility], [address] [place], [place] [address]  Basic metabolic panel     Status: Abnormal   Collection Time: 03/02/18  3:29 PM  Result Value Ref Range   Sodium 141 135 - 145 mmol/L   Potassium 3.4 (L) 3.5 - 5.1 mmol/L   Chloride 103 98 - 111 mmol/L   CO2 28 22 - 32 mmol/L   Glucose, Bld 94 70 - 99 mg/dL   BUN 20 6 - 20 mg/dL   Creatinine, Ser 1.26 (H) 0.44 - 1.00 mg/dL   Calcium 9.5 8.9 - 10.3 mg/dL   GFR calc non Af Amer 46 (L) >60 mL/min   GFR calc Af Amer 53 (L) >60 mL/min    Comment: (NOTE) The eGFR has been calculated using the CKD EPI equation. This calculation has not been validated in all clinical situations. eGFR's persistently <60 mL/min signify possible Chronic Kidney Disease.    Anion gap 10 5 - 15    Comment: Performed at [facility], [address] [place], [place] [address]  Protime-INR     Status: None   Collection Time: 03/02/18  3:29 PM  Result Value Ref Range   Prothrombin Time 13.1 11.4 - 15.2 seconds   INR 1.00     Comment: Performed at [facility], [address] [place], [place] [address]  APTT     Status: None   Collection Time: 03/02/18  3:29 PM  Result Value Ref Range   aPTT 30 24 - 36 seconds    Comment: Performed at [facility], [address], [place], [place] [address]  Urinalysis, Routine w reflex microscopic     Status: Abnormal   Collection Time: 03/02/18  3:29 PM  Result Value Ref Range   Color, Urine YELLOW (A) YELLOW   APPearance CLEAR (A) CLEAR   Specific Gravity, Urine 1.015 1.005 - 1.030   pH 5.0 5.0 - 8.0   Glucose, UA NEGATIVE NEGATIVE mg/dL   Hgb urine dipstick NEGATIVE NEGATIVE   Bilirubin Urine NEGATIVE NEGATIVE   Ketones, ur NEGATIVE NEGATIVE mg/dL   Protein, ur NEGATIVE NEGATIVE mg/dL   Nitrite NEGATIVE NEGATIVE   Leukocytes, UA NEGATIVE NEGATIVE    Comment: Performed at [facility], [address], [place], [place] [address]  Surgical pcr screen     Status: None   Collection Time: 03/02/18  3:29 PM  Result Value Ref Range   MRSA, PCR NEGATIVE NEGATIVE   Staphylococcus aureus NEGATIVE NEGATIVE    Comment: (NOTE) The Xpert SA Assay (FDA approved for NASAL specimens in patients 43 years of age and older), is one component of a comprehensive surveillance program. It is not intended to diagnose infection nor to guide or monitor treatment. Performed at [facility], [address] [place], [place] [address]   Type and screen [place]     Status: None   Collection Time: 03/02/18  3:29 PM  Result Value Ref Range   ABO/RH(D) B POS    Antibody Screen NEG    Sample Expiration 03/16/2018    Extend sample reason      NO TRANSFUSIONS OR PREGNANCY IN THE PAST 3 MONTHS Performed at [facility], [address] [place], [place] [address]   Glucose, capillary     Status: Abnormal   Collection Time: 03/08/18  9:06 AM  Result Value Ref Range   Glucose-Capillary 115 (H) 70 - 99 mg/dL  ABO/Rh     Status: None   Collection Time: 03/08/18  9:16 AM   Result Value Ref Range   ABO/RH(D)      B POS Performed at [facility], [address] [place], [place] [address]   Glucose, capillary     Status: Abnormal   Collection Time: 03/08/18  2:15 PM  Result Value Ref Range   Glucose-Capillary 105 (H) 70 - 99 mg/dL   Objective  Body mass index is 35.94 kg/m. Wt Readings from Last 3 Encounters:  04/05/18 216 lb (98 kg)  03/02/18 210 lb (95.3 kg)  02/24/18 211 lb (95.7 kg)   Temp Readings from Last 3 Encounters:  04/05/18 97.8 F (36.6 C) (Oral)  03/09/18 99 F (37.2 C) (Oral)  02/24/18 98 F (36.7 C) (Oral)   BP Readings from Last 3 Encounters:  04/05/18 120/70  03/09/18 (!) 142/84  03/02/18 114/68   Pulse Readings from Last 3 Encounters:  04/05/18 64  03/09/18 66  03/02/18 80    Physical Exam  Constitutional: She is oriented to person, place, and time. Vital signs are normal. She appears well-developed and well-nourished. She is cooperative.  HENT:  Head: Normocephalic and atraumatic.  Mouth/Throat: Oropharynx is clear and moist and mucous membranes are normal.  Eyes: Pupils are equal, round, and reactive to light. Conjunctivae are normal.  Cardiovascular: Normal rate, regular rhythm and normal heart sounds.  Pulmonary/Chest: Effort normal and breath sounds normal.  Neurological: She is alert and oriented to person, place, and time. Gait normal.  Skin: Skin is warm and dry.     Psychiatric: She has a normal mood and affect. Her speech is  normal and behavior is normal. Judgment and thought content normal. Cognition and memory are normal.  Nursing note and vitals reviewed.   Assessment   1. S/p posterior cervical laminectomy 03/08/18 doing well radiculopathy sxs resolved and neck pain resolved  2. DM 2 hx and prediabetes noted 06/2017 labs 6.0  3 .CAD s/p CABG/PAD 4. HM 5. Anxiety/depression controlled  Plan   1. F/u 04/06/18 Dr. Lacinda Axon  2. Check labs today CMET, lipid, UA/protein/A1C Refills lancets and  strips  3. Cont meds and f/u cards  Lipid today on zetia and statin  4.  Had flu shot today -will need Tdap and shingrix in future  pna 23 had 03/10/16  S/p hysterectomy no cervix and f/u OB/GYNh/o abnormal pap s/p cryo  -pap 05/08/15 neg pap Colonoscopy Dr. Allen Norris 01/2017 polyps, gastritis  Mammogram had 09/29/17 uni right negdue 09/2018  CT chest had 06/12/16 with chronic changes lung bases otherwise normal former smoker   Had thyroid labs 01/2018 care everywhere normal   See note 01/21/18 [facility] details reviewed 5. Refilled paxil 10 mg qd  Provider: Dr. Olivia Mackie [name]-Internal Medicine

## 2018-04-05 NOTE — Patient Instructions (Addendum)
04/06/2018 [place], [place] [address] [place], [place] [address]  [phone]  [phone] (Fax)    [place] over the counter and keep wound clean  F/u in 3-4 months

## 2018-04-05 NOTE — Addendum Note (Signed)
Addended by: Arby Barrette on: 04/05/2018 11:07 AM   Modules accepted: Orders

## 2018-04-05 NOTE — Progress Notes (Signed)
Pre visit review using our clinic review tool, if applicable. No additional management support is needed unless otherwise documented below in the visit note. 

## 2018-04-06 ENCOUNTER — Other Ambulatory Visit: Payer: Medicare HMO

## 2018-04-15 ENCOUNTER — Other Ambulatory Visit (INDEPENDENT_AMBULATORY_CARE_PROVIDER_SITE_OTHER): Payer: Medicare HMO

## 2018-04-15 DIAGNOSIS — I10 Essential (primary) hypertension: Secondary | ICD-10-CM

## 2018-04-15 DIAGNOSIS — E119 Type 2 diabetes mellitus without complications: Secondary | ICD-10-CM

## 2018-04-15 DIAGNOSIS — E559 Vitamin D deficiency, unspecified: Secondary | ICD-10-CM | POA: Diagnosis not present

## 2018-04-15 DIAGNOSIS — E782 Mixed hyperlipidemia: Secondary | ICD-10-CM | POA: Diagnosis not present

## 2018-04-15 LAB — COMPREHENSIVE METABOLIC PANEL
ALBUMIN: 4.1 g/dL (ref 3.5–5.2)
ALK PHOS: 73 U/L (ref 39–117)
ALT: 15 U/L (ref 0–35)
AST: 16 U/L (ref 0–37)
BUN: 14 mg/dL (ref 6–23)
CALCIUM: 9.5 mg/dL (ref 8.4–10.5)
CO2: 27 meq/L (ref 19–32)
Chloride: 101 mEq/L (ref 96–112)
Creatinine, Ser: 1.05 mg/dL (ref 0.40–1.20)
GFR: 68.83 mL/min (ref >60.00)
Glucose, Bld: 100 mg/dL — ABNORMAL HIGH (ref 70–99)
Potassium: 3.3 mEq/L — ABNORMAL LOW (ref 3.5–5.1)
Sodium: 139 mEq/L (ref 135–145)
Total Bilirubin: 0.4 mg/dL (ref 0.2–1.2)
Total Protein: 7.2 g/dL (ref 6.0–8.3)

## 2018-04-15 LAB — HEMOGLOBIN A1C: Hgb A1c MFr Bld: 6.8 % — ABNORMAL HIGH (ref 4.6–6.5)

## 2018-04-15 LAB — LIPID PANEL
CHOLESTEROL: 154 mg/dL (ref 0–200)
HDL: 40.5 mg/dL (ref >39.00)
LDL Cholesterol: 81 mg/dL (ref 0–99)
NonHDL: 113.7
TRIGLYCERIDES: 166 mg/dL — AB (ref 0.0–149.0)
Total CHOL/HDL Ratio: 4
VLDL: 33.2 mg/dL (ref 0.0–40.0)

## 2018-04-15 LAB — VITAMIN D 25 HYDROXY (VIT D DEFICIENCY, FRACTURES): VITD: 33.62 ng/mL (ref 30.00–100.00)

## 2018-04-16 ENCOUNTER — Other Ambulatory Visit: Payer: Self-pay | Admitting: Internal Medicine

## 2018-04-16 DIAGNOSIS — E119 Type 2 diabetes mellitus without complications: Secondary | ICD-10-CM

## 2018-04-16 MED ORDER — METFORMIN HCL ER 500 MG PO TB24: 500.0000 mg | ORAL_TABLET | Freq: Every day | ORAL | 3 refills | Status: DC

## 2018-04-27 DIAGNOSIS — M545 Low back pain: Secondary | ICD-10-CM | POA: Diagnosis not present

## 2018-04-27 DIAGNOSIS — M542 Cervicalgia: Secondary | ICD-10-CM | POA: Diagnosis not present

## 2018-04-28 DIAGNOSIS — M5412 Radiculopathy, cervical region: Secondary | ICD-10-CM | POA: Diagnosis not present

## 2018-05-03 ENCOUNTER — Telehealth: Payer: Self-pay | Admitting: Internal Medicine

## 2018-05-03 ENCOUNTER — Other Ambulatory Visit: Payer: Self-pay | Admitting: Internal Medicine

## 2018-05-03 DIAGNOSIS — I2510 Atherosclerotic heart disease of native coronary artery without angina pectoris: Secondary | ICD-10-CM

## 2018-05-03 MED ORDER — METOPROLOL TARTRATE 25 MG PO TABS: 25.0000 mg | ORAL_TABLET | Freq: Two times a day (BID) | ORAL | 3 refills | Status: DC

## 2018-05-03 NOTE — Telephone Encounter (Signed)
Copied from [place]. Topic: Quick Communication - Rx Refill/Question >> May 03, 2018 12:28 PM Burchel, Abbi R wrote: Medication: metoprolol tartrate (LOPRESSOR) 25 MG tablet   Preferred Pharmacy: [facility] [address] ([address]), [place]) [place] [address] Phone: [phone] Fax: [phone]    Pt states she is taking her last pill tonight and will be completely out of this medication.  Pt: [phone]

## 2018-05-03 NOTE — Telephone Encounter (Signed)
rx request  Last filled on 04-30-15 by Dr. [name] Last saw Dr. Aundra Dubin on 04-05-18

## 2018-05-06 DIAGNOSIS — M5412 Radiculopathy, cervical region: Secondary | ICD-10-CM | POA: Diagnosis not present

## 2018-05-12 ENCOUNTER — Telehealth: Payer: Self-pay | Admitting: Internal Medicine

## 2018-05-12 NOTE — Telephone Encounter (Signed)
Can I place this patient in a same day ?

## 2018-05-12 NOTE — Telephone Encounter (Signed)
I have availability 05/14/18 if she needs to be seen sooner work in with another provider here or urgent care   Thanks [place]

## 2018-05-12 NOTE — Telephone Encounter (Signed)
Copied from [place] [id number]. Topic: Appointment Scheduling - Scheduling Inquiry for Clinic >> May 12, 2018 11:32 AM Cecelia Byars, NT wrote: Reason for GPQ:DIYMEBR has rib pain and also groin pain and would like to be  seen by Dr Aundra Dubin ,her visits are for 30 minutes ,there is nothing soon please advise  [phone]

## 2018-05-13 DIAGNOSIS — M5412 Radiculopathy, cervical region: Secondary | ICD-10-CM | POA: Diagnosis not present

## 2018-05-13 NOTE — Telephone Encounter (Signed)
Left message for patient to call office. PEC may schedule.

## 2018-05-14 ENCOUNTER — Ambulatory Visit
Admission: RE | Admit: 2018-05-14 | Discharge: 2018-05-14 | Disposition: A | Discharge status: Home / Self Care | Payer: Medicare HMO | Source: Ambulatory Visit | Attending: Internal Medicine | Admitting: Internal Medicine

## 2018-05-14 ENCOUNTER — Ambulatory Visit (INDEPENDENT_AMBULATORY_CARE_PROVIDER_SITE_OTHER): Payer: Medicare HMO | Admitting: Internal Medicine

## 2018-05-14 ENCOUNTER — Encounter: Payer: Self-pay | Admitting: Internal Medicine

## 2018-05-14 VITALS — BP 122/84 | HR 84 | Temp 98.0°F | Ht 65.0 in | Wt 208.8 lb

## 2018-05-14 DIAGNOSIS — W19XXXA Unspecified fall, initial encounter: Secondary | ICD-10-CM

## 2018-05-14 DIAGNOSIS — I10 Essential (primary) hypertension: Secondary | ICD-10-CM | POA: Diagnosis not present

## 2018-05-14 DIAGNOSIS — R0781 Pleurodynia: Secondary | ICD-10-CM

## 2018-05-14 DIAGNOSIS — E119 Type 2 diabetes mellitus without complications: Secondary | ICD-10-CM

## 2018-05-14 DIAGNOSIS — W19XXXD Unspecified fall, subsequent encounter: Secondary | ICD-10-CM

## 2018-05-14 DIAGNOSIS — R918 Other nonspecific abnormal finding of lung field: Secondary | ICD-10-CM | POA: Insufficient documentation

## 2018-05-14 DIAGNOSIS — J90 Pleural effusion, not elsewhere classified: Secondary | ICD-10-CM | POA: Diagnosis not present

## 2018-05-14 DIAGNOSIS — M545 Low back pain, unspecified: Secondary | ICD-10-CM

## 2018-05-14 DIAGNOSIS — S299XXA Unspecified injury of thorax, initial encounter: Secondary | ICD-10-CM | POA: Diagnosis not present

## 2018-05-14 DIAGNOSIS — J9811 Atelectasis: Secondary | ICD-10-CM | POA: Insufficient documentation

## 2018-05-14 DIAGNOSIS — M542 Cervicalgia: Secondary | ICD-10-CM

## 2018-05-14 HISTORY — DX: Unspecified fall, initial encounter: W19.XXXA

## 2018-05-14 MED ORDER — OXYCODONE HCL 5 MG PO TABS: 5.0000 mg | ORAL_TABLET | Freq: Three times a day (TID) | ORAL | 0 refills | Status: DC | PRN

## 2018-05-14 NOTE — Progress Notes (Addendum)
Chief Complaint  Patient presents with  . Leg Pain    pt is complaining of right ribs area pain and right upper legs pain.    F/u  1 fall 3 weeks ago fell on buttocks s/p cervical neck surgery she saw NS Dr. Lacinda Axon 05/07/18 Xrays of cervical and lumbar they gave her Oxycodone 5 tid prn and baclofen 10 tid prn. She is having right rib pain and pain with bending, breathing, turning max 8-9/10 2. DM 2 on Metformin XR 500 mg qd, HLD on Crestor 20 mg qd and zetia 10 A1C 6.8 03/2018  3. HTN controlled on lis 5 mg qd   Review of Systems  Constitutional: Negative for weight loss.  HENT: Negative for hearing loss.   Eyes: Negative for blurred vision.  Respiratory: Negative for shortness of breath.   Cardiovascular: Positive for chest pain.       With deep breaths on the right    Musculoskeletal: Positive for back pain, falls and joint pain.  Skin: Negative for rash.  Neurological: Negative for headaches.  Psychiatric/Behavioral: Negative for depression.   Past Medical History:  Diagnosis Date  . (HFpEF) heart failure with preserved ejection fraction ([place])    a. 06/2016 Echo: EF 50%, no rwma, mild to mod TR.  [name] Allergy   . Anemia   . Arthritis   . Asthma   . Bacterial vaginitis   . Blood in stool   . Brachial neuritis or radiculitis NOS   . 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 being followed by neurosurgery/eye doctor and pcp. monitoring size.causes sinus problems  . Bronchitis   . Cardiac arrhythmia   . Cervical neck pain with evidence of disc disease    C5/6 disease, MRI done late 2012 - no records available  . Chronic pain   . Cocaine abuse, in remission ([place])    clean x 24 years  . Colon polyps   . COPD (chronic obstructive pulmonary disease) (HCC)    many inhalers  . Coronary artery disease    a. PCI of LCX 2003; b. PCI of the LAD 2012 with a (2.5 x 8 mm BMS);  c.s/p CABG 4/12:  L-LAD, S-Dx, S-OM, S-RCA (Dr. Prescott Gum);  d. 01/2012 MV: inf infarct, attenuation,  no ischemia.  . Depression   . Diabetes mellitus without complication ([place])   . Family history of colon cancer   . Generalized headaches    frequent  . GERD (gastroesophageal reflux disease)   . Headache   . Heart murmur   . Hematochezia   . Hepatitis    history of hepatitis b  . History of cervical cancer    s/p cryotherapy  . History of drug abuse ([place])    cocaine, marijuana, clean since 1989  . History of hepatitis B    from eating undercooked liver  . History of MI (myocardial infarction)   . Hyperlipidemia   . Hypertension   . Myocardial infarction [facility]) 2003, 2012  . PAD (peripheral artery disease) (HCC)    s/p Right SFA atherectomy and PTA 01/15/11, normal ABIs 01/2011  . Polyp of colon   . Seasonal allergies   . Sleep apnea    uses cpap  . Smoking history    quit 07/2010  . Systolic CHF, chronic (HCC)    mild: echo 08/2010 - mildly reduced EF 40-45%, mild diffuse hypokinesis  . Thyroid disease   . Urinary incontinence   . Urine incontinence  Past Surgical History:  Procedure Laterality Date  . ABDOMINAL HYSTERECTOMY     2003  . CHOLECYSTECTOMY  1986  . COLONOSCOPY  2008   3 polyps  . COLONOSCOPY WITH PROPOFOL N/A 02/06/2015   Procedure: COLONOSCOPY WITH PROPOFOL;  Surgeon: Lucilla Lame, MD;  Location: [facility] ENDOSCOPY;  Service: Endoscopy;  Laterality: N/A;  . COLONOSCOPY WITH PROPOFOL N/A 01/27/2017   Procedure: COLONOSCOPY WITH PROPOFOL;  Surgeon: Lucilla Lame, MD;  Location: [facility] ENDOSCOPY;  Service: Endoscopy;  Laterality: N/A;  . CORONARY ANGIOPLASTY     w/ stent placement x2  . CORONARY ARTERY BYPASS GRAFT  2012   Dr [name]  . CORONARY STENT PLACEMENT  2003   S/P MI  . CORONARY STENT PLACEMENT  2007   [place]  . ESOPHAGOGASTRODUODENOSCOPY (EGD) WITH PROPOFOL N/A 02/06/2015   Procedure: ESOPHAGOGASTRODUODENOSCOPY (EGD) WITH PROPOFOL;  Surgeon: Lucilla Lame, MD;  Location: [facility] ENDOSCOPY;  Service: Endoscopy;  Laterality: N/A;  . ESOPHAGOGASTRODUODENOSCOPY  (EGD) WITH PROPOFOL N/A 01/27/2017   Procedure: ESOPHAGOGASTRODUODENOSCOPY (EGD) WITH PROPOFOL;  Surgeon: Lucilla Lame, MD;  Location: [facility] ENDOSCOPY;  Service: Endoscopy;  Laterality: N/A;  . FEMORAL ARTERY STENT  10/2010   right sided (Dr. Burt Knack)  . KNEE ARTHROSCOPY WITH MEDIAL MENISECTOMY Right 08/20/2017   Procedure: KNEE ARTHROSCOPY WITH MEDIAL  AND LATERAL MENISECTOMY;  Surgeon: Hessie Knows, MD;  Location: [facility] ORS;  Service: Orthopedics;  Laterality: Right;  . POSTERIOR CERVICAL LAMINECTOMY N/A 03/08/2018   Procedure: POSTERIOR CERVICAL LAMINECTOMY-C7;  Surgeon: Deetta Perla, MD;  Location: [facility] ORS;  Service: Neurosurgery;  Laterality: N/A;  . TUBAL LIGATION     Family History  Problem Relation Age of Onset  . Hypertension Father   . Heart failure Father   . Diabetes Father   . Colon cancer Father 52  . Glaucoma Father   . Cancer Father        colorectal   . Heart disease Father   . Other Father        glaucoma  . Breast cancer Mother 35       breast cancer, late 27's  . Cancer Mother        breast  . Brain cancer Sister   . Arthritis Sister   . Diabetes Sister   . Hypertension Sister   . Kidney disease Sister   . Diabetes Brother   . Hypertension Brother   . Other Sister        brain tumor   . Coronary artery disease Neg Hx   . Stroke Neg Hx    Social History   Socioeconomic History  . Marital status: Legally Separated    Spouse name: Not on file  . Number of children: Not on file  . Years of education: Not on file  . Highest education level: Not on file  Occupational History  . Not on file  Social Needs  . Financial resource strain: Not on file  . Food insecurity:    Worry: Not on file    Inability: Not on file  . Transportation needs:    Medical: Not on file    Non-medical: Not on file  Tobacco Use  . Smoking status: Former Smoker    Packs/day: 1.00    Years: 38.00    Pack years: 38.00    Types: Cigarettes    Last attempt to quit: 08/12/2010     Years since quitting: 7.7  . Smokeless tobacco: Never Used  Substance and Sexual Activity  . Alcohol use: No  .  Drug use: No    Types: Cocaine    Comment: Remote Hx (crack cocaine and marijuana)  . Sexual activity: Yes  Lifestyle  . Physical activity:    Days per week: Not on file    Minutes per session: Not on file  . Stress: Not on file  Relationships  . Social connections:    Talks on phone: Not on file    Gets together: Not on file    Attends religious service: Not on file    Active member of club or organization: Not on file    Attends meetings of clubs or organizations: Not on file    Relationship status: Not on file  . Intimate partner violence:    Fear of current or ex partner: Not on file    Emotionally abused: Not on file    Physically abused: Not on file    Forced sexual activity: Not on file  Other Topics Concern  . 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 albuterol (PROVENTIL HFA;VENTOLIN HFA) 108 (90 Base) MCG/ACT inhaler Inhale 2 puffs into the lungs every 6 (six) hours as needed for wheezing or shortness of breath.  [name] aspirin 81 MG tablet Take 1 tablet (81 mg total) by mouth daily.  . Azelastine HCl 0.15 % SOLN Place 2 sprays into both nostrils daily as needed (allergies).   [name] esomeprazole (NEXIUM) 40 MG capsule Take 40 mg by mouth daily. Reported on 09/04/2015  . ezetimibe (ZETIA) 10 MG tablet TAKE ONE TABLET BY MOUTH ONCE DAILY  . fluticasone (FLONASE) 50 MCG/ACT nasal spray Place 2 sprays into both nostrils daily as needed for allergies.   . furosemide (LASIX) 20 MG tablet TAKE ONE TABLET BY  MOUTH ONCE DAILY FOR SWELLING. MAY TAKE ONE ADDITIONAL TABLET AS NEEDED FOR SWELLING OR FLUID (Patient taking differently: TAKE ONE TABLET BY MOUTH ONCE DAILY AND ANOTHER DOSE IF NEEDED FOR FLUID BUILD UP)  . glucose blood (ONE TOUCH ULTRA TEST) test strip Use as instructed check cbg qd E11.9  . isosorbide mononitrate (IMDUR) 30 MG 24 hr tablet TAKE ONE TABLET BY MOUTH TWICE DAILY  . KLOR-CON M10 10 MEQ tablet TAKE ONE TABLET BY MOUTH ONCE DAILY  . Lancets MISC 1 Device by Does not apply route daily. Lancets E11.9  . lisinopril (PRINIVIL,ZESTRIL) 5 MG tablet Take 1 tablet (5 mg total) by mouth daily. (Patient taking differently: Take 5 mg by mouth every evening. At 5 pm)  . meclizine (ANTIVERT) 25 MG tablet Take 25 mg by mouth 3 (three) times daily.   . metFORMIN (GLUCOPHAGE-XR) 500 MG 24 hr tablet Take 1 tablet (500 mg total) by mouth daily with breakfast.  . metoprolol tartrate (LOPRESSOR) 25 MG tablet Take 1 tablet (25 mg total) by mouth 2 (two) times daily.  . nitroGLYCERIN (NITROSTAT) 0.4 MG SL tablet Place 1 tablet (0.4 mg total) under the tongue every 5 (five) minutes as needed. (Patient taking differently: Place 0.4 mg under the tongue every 5 (five) minutes as needed for  chest pain. )  . PARoxetine (PAXIL) 10 MG tablet Take 1 tablet (10 mg total) by mouth daily.  . rivaroxaban (XARELTO) 2.5 MG TABS tablet Take 1 tablet (2.5 mg total) by mouth 2 (two) times daily.  . rosuvastatin (CRESTOR) 20 MG tablet Take 20 mg by mouth daily.   [name] umeclidinium-vilanterol (ANORO ELLIPTA) 62.5-25 MCG/INH AEPB Inhale 1 puff into the lungs daily.  . vitamin B-12 (CYANOCOBALAMIN) 1000 MCG tablet Take 1,000 mcg by mouth daily.   Allergies  Allergen Reactions  . Latex Rash       . Shellfish Allergy Anaphylaxis    Hard shellfish/swelling in throat  . Sulfonamide Derivatives Anaphylaxis    Swelling in throat.  . Watermelon [Citrullus Vulgaris] Other (See Comments)    Throat itchy  . Other Swelling    Recent Results (from the past 2160 hour(s))  CBC     Status: None   Collection Time: 03/02/18  3:29 PM  Result Value Ref Range   WBC 9.5 3.6 - 11.0 K/uL   RBC 4.80 3.80 - 5.20 MIL/uL   Hemoglobin 15.5 12.0 - 16.0 g/dL   HCT 45.5 35.0 - 47.0 %   MCV 94.7 80.0 - 100.0 fL   MCH 32.3 26.0 - 34.0 pg   MCHC 34.1 32.0 - 36.0 g/dL   RDW 13.4 11.5 - 14.5 %   Platelets 203 150 - 440 K/uL    Comment: Performed at [facility], [address] [place], [place] [address]  Differential     Status: Abnormal   Collection Time: 03/02/18  3:29 PM  Result Value Ref Range   Neutrophils Relative % 79 %   Neutro Abs 7.6 (H) 1.4 - 6.5 K/uL   Lymphocytes Relative 18 %   Lymphs Abs 1.7 1.0 - 3.6 K/uL   Monocytes Relative 2 %   Monocytes Absolute 0.2 0.2 - 0.9 K/uL   Eosinophils Relative 0 %   Eosinophils Absolute 0.0 0 - 0.7 K/uL   Basophils Relative 1 %   Basophils Absolute 0.1 0 - 0.1 K/uL    Comment: Performed at [facility], [address] [place], [place] [address]  Basic metabolic panel     Status: Abnormal   Collection Time: 03/02/18  3:29 PM  Result Value Ref Range   Sodium 141 135 - 145 mmol/L   Potassium 3.4 (L) 3.5 - 5.1 mmol/L   Chloride 103 98 - 111 mmol/L   CO2 28 22 - 32 mmol/L   Glucose, Bld 94 70 - 99 mg/dL   BUN 20 6 - 20 mg/dL   Creatinine, Ser 1.26 (H) 0.44 - 1.00 mg/dL   Calcium 9.5 8.9 - 10.3 mg/dL   GFR calc non Af Amer 46 (L) >60 mL/min   GFR calc Af Amer 53 (L) >60 mL/min    Comment: (NOTE) The eGFR has been calculated using the CKD EPI equation. This calculation has not been validated in all clinical situations. eGFR's persistently <60 mL/min signify possible Chronic Kidney Disease.    Anion gap 10 5 - 15    Comment: Performed at [facility], [address] [place], [place] [address]  Protime-INR     Status: None   Collection Time: 03/02/18  3:29 PM  Result Value Ref Range   Prothrombin Time 13.1 11.4 - 15.2 seconds   INR  1.00     Comment: Performed at [facility], [address] [place],  [address]  APTT     Status:  None   Collection Time: 03/02/18  3:29 PM  Result Value Ref Range   aPTT 30 24 - 36 seconds    Comment: Performed at [facility], [address] [place], [place] [address]  Urinalysis, Routine w reflex microscopic     Status: Abnormal   Collection Time: 03/02/18  3:29 PM  Result Value Ref Range   Color, Urine YELLOW (A) YELLOW   APPearance CLEAR (A) CLEAR   Specific Gravity, Urine 1.015 1.005 - 1.030   pH 5.0 5.0 - 8.0   Glucose, UA NEGATIVE NEGATIVE mg/dL   Hgb urine dipstick NEGATIVE NEGATIVE   Bilirubin Urine NEGATIVE NEGATIVE   Ketones, ur NEGATIVE NEGATIVE mg/dL   Protein, ur NEGATIVE NEGATIVE mg/dL   Nitrite NEGATIVE NEGATIVE   Leukocytes, UA NEGATIVE NEGATIVE    Comment: Performed at [facility], [address], [place], [place] [address]  Surgical pcr screen     Status: None   Collection Time: 03/02/18  3:29 PM  Result Value Ref Range   MRSA, PCR NEGATIVE NEGATIVE   Staphylococcus aureus NEGATIVE NEGATIVE    Comment: (NOTE) The Xpert SA Assay (FDA approved for NASAL specimens in patients 60 years of age and older), is one component of a comprehensive surveillance program. It is not intended to diagnose infection nor to guide or monitor treatment. Performed at [facility], [address] [place], [place] [address]   Type and screen [place]     Status: None   Collection Time: 03/02/18  3:29 PM  Result Value Ref Range   ABO/RH(D) B POS    Antibody Screen NEG    Sample Expiration 03/16/2018    Extend sample reason      NO TRANSFUSIONS OR PREGNANCY IN THE PAST 3 MONTHS Performed at [facility], [address] [place], [place] [address]   Glucose, capillary     Status: Abnormal   Collection Time: 03/08/18  9:06 AM  Result Value Ref Range   Glucose-Capillary 115 (H) 70 - 99 mg/dL   ABO/Rh     Status: None   Collection Time: 03/08/18  9:16 AM  Result Value Ref Range   ABO/RH(D)      B POS Performed at [facility], [address] [place], [place] [address]   Glucose, capillary     Status: Abnormal   Collection Time: 03/08/18  2:15 PM  Result Value Ref Range   Glucose-Capillary 105 (H) 70 - 99 mg/dL  Urinalysis, Routine w reflex microscopic     Status: None   Collection Time: 04/05/18 10:47 AM  Result Value Ref Range   Color, Urine YELLOW Yellow;Lt. Yellow   APPearance CLEAR Clear   Specific Gravity, Urine 1.010 1.000 - 1.030   pH 6.0 5.0 - 8.0   Total Protein, Urine NEGATIVE Negative   Urine Glucose NEGATIVE Negative   Ketones, ur NEGATIVE Negative   Bilirubin Urine NEGATIVE Negative   Hgb urine dipstick NEGATIVE Negative   Urobilinogen, UA 0.2 0.0 - 1.0   Leukocytes, UA NEGATIVE Negative   Nitrite NEGATIVE Negative   WBC, UA none seen 0-2/hpf   RBC / HPF none seen 0-2/hpf   Squamous Epithelial / LPF Rare(0-4/hpf) Rare(0-4/hpf)  Urine Microalbumin w/creat. ratio     Status: None   Collection Time: 04/05/18 10:47 AM  Result Value Ref Range   Microalb, Ur <0.7 0.0 - 1.9 mg/dL   Creatinine,U 11.1 mg/dL   Microalb Creat Ratio 6.3 0.0 - 30.0  mg/g  VITAMIN D 25 Hydroxy (Vit-D Deficiency, Fractures)     Status: None   Collection Time: 04/15/18  8:28 AM  Result Value Ref Range   VITD 33.62 30.00 - 100.00 ng/mL  Lipid Profile     Status: Abnormal   Collection Time: 04/15/18  8:28 AM  Result Value Ref Range   Cholesterol 154 0 - 200 mg/dL    Comment: ATP III Classification       Desirable:  < 200 mg/dL               Borderline High:  200 - 239 mg/dL          High:  > = 240 mg/dL   Triglycerides 166.0 (H) 0.0 - 149.0 mg/dL    Comment: Normal:  <150 mg/dLBorderline High:  150 - 199 mg/dL   HDL 40.50 >39.00 mg/dL   VLDL 33.2 0.0 - 40.0 mg/dL   LDL Cholesterol 81 0 - 99 mg/dL   Total CHOL/HDL Ratio 4     Comment:                Men           Women1/2 Average Risk     3.4          3.3Average Risk          5.0          4.42X Average Risk          9.6          7.13X Average Risk          15.0          11.0                       NonHDL 113.70     Comment: NOTE:  Non-HDL goal should be 30 mg/dL higher than patient's LDL goal (i.e. LDL goal of < 70 mg/dL, would have non-HDL goal of < 100 mg/dL)  Hemoglobin A1C     Status: Abnormal   Collection Time: 04/15/18  8:28 AM  Result Value Ref Range   Hgb A1c MFr Bld 6.8 (H) 4.6 - 6.5 %    Comment: Glycemic Control Guidelines for People with Diabetes:Non Diabetic:  <6%Goal of Therapy: <7%Additional Action Suggested:  >8%   Comprehensive metabolic panel     Status: Abnormal   Collection Time: 04/15/18  8:28 AM  Result Value Ref Range   Sodium 139 135 - 145 mEq/L   Potassium 3.3 (L) 3.5 - 5.1 mEq/L   Chloride 101 96 - 112 mEq/L   CO2 27 19 - 32 mEq/L   Glucose, Bld 100 (H) 70 - 99 mg/dL   BUN 14 6 - 23 mg/dL   Creatinine, Ser 1.05 0.40 - 1.20 mg/dL   Total Bilirubin 0.4 0.2 - 1.2 mg/dL   Alkaline Phosphatase 73 39 - 117 U/L   AST 16 0 - 37 U/L   ALT 15 0 - 35 U/L   Total Protein 7.2 6.0 - 8.3 g/dL   Albumin 4.1 3.5 - 5.2 g/dL   Calcium 9.5 8.4 - 10.5 mg/dL   GFR 68.83 >60.00 mL/min   Objective  Body mass index is 34.75 kg/m. Wt Readings from Last 3 Encounters:  05/14/18 208 lb 12.8 oz (94.7 kg)  04/05/18 216 lb (98 kg)  03/02/18 210 lb (95.3 kg)   Temp Readings from Last 3 Encounters:  05/14/18 98 F (36.7 C) (Oral)  04/05/18  97.8 F (36.6 C) (Oral)  03/09/18 99 F (37.2 C) (Oral)   BP Readings from Last 3 Encounters:  05/14/18 122/84  04/05/18 120/70  03/09/18 (!) 142/84   Pulse Readings from Last 3 Encounters:  05/14/18 84  04/05/18 64  03/09/18 66    Physical Exam  Constitutional: She is oriented to person, place, and time. Vital signs are normal. She appears well-developed and well-nourished. She is cooperative.  HENT:  Head: Normocephalic and atraumatic.   Mouth/Throat: Oropharynx is clear and moist and mucous membranes are normal.  Eyes: Pupils are equal, round, and reactive to light. Conjunctivae are normal.  Cardiovascular: Normal rate, regular rhythm and normal heart sounds.  Pulmonary/Chest: Effort normal and breath sounds normal. She exhibits tenderness.    Musculoskeletal:       Lumbar back: She exhibits tenderness.       Arms: Neurological: She is alert and oriented to person, place, and time. Gait normal.  Skin: Skin is warm, dry and intact.  Psychiatric: She has a normal mood and affect. Her speech is normal and behavior is normal. Judgment and thought content normal. Cognition and memory are normal.  Nursing note and vitals reviewed.   Assessment   1. Fall ~04/27/18 with right rib pain and pleuritic chest pain and low back and neck pain s/p surgery  2. DM2 6.8 03/2018  3. HTN  4. HM Plan   1.  Xray CXR and right rib  Pain control oxycodone 5 tid prn 5 days supply  F/u with NS Dr. Deetta Perla  Continue with PT  Will need to get copy of Xrays C and L spine 04/27/18  Has prn baclofen 10 mg tid prn  2.  Cont metformin, ACEI, crestor Will mail cholesterol handout  3. Cont meds  Mail high K handout food list last labs low K on K 53mq qd  4.  Had flu shot utd -will need Tdap and shingrix in future  pna 23 had 03/10/16  S/p hysterectomy no cervix and f/u OB/GYNh/o abnormal pap s/p cryo  -pap 05/08/15 neg papwill  Repeat In 5 years  Colonoscopy Dr. WAllen Norris7/2018 polyps, gastritis  Mammogram had 09/29/17 uni right negdue 09/30/2018  CT chest had 06/12/16 with chronic changes lung bases otherwise normal former smoker   Had thyroid labs 01/2018 care everywhere normal   Saw Dr. CLacinda AxonNS 06/22/18 left occipital neuralgia rec PT, heat, baclofen and neck stretching if left arm sxs continue consider repeat MRI  Provider: Dr. TOlivia MackieMcLean-Scocuzza-Internal Medicine

## 2018-05-14 NOTE — Patient Instructions (Addendum)
We will do Xrays today and pain control  crestor take at night   Rib Fracture A rib fracture is a break or crack in one of the bones of the ribs. The ribs are a group of long, curved bones that wrap around your chest and attach to your spine. They protect your lungs and other organs in the chest cavity. A broken or cracked rib is often painful, but most do not cause other problems. Most rib fractures heal on their own over time. However, rib fractures can be more serious if multiple ribs are broken or if broken ribs move out of place and push against other structures. What are the causes?  A direct blow to the chest. For example, this could happen during contact sports, a car accident, or a fall against a hard object.  Repetitive movements with high force, such as pitching a baseball or having severe coughing spells. What are the signs or symptoms?  Pain when you breathe in or cough.  Pain when someone presses on the injured area. How is this diagnosed? Your caregiver will perform a physical exam. Various imaging tests may be ordered to confirm the diagnosis and to look for related injuries. These tests may include a chest X-ray, computed tomography (CT), magnetic resonance imaging (MRI), or a bone scan. How is this treated? Rib fractures usually heal on their own in 1-3 months. The longer healing period is often associated with a continued cough or other aggravating activities. During the healing period, pain control is very important. Medication is usually given to control pain. Hospitalization or surgery may be needed for more severe injuries, such as those in which multiple ribs are broken or the ribs have moved out of place. Follow these instructions at home:  Avoid strenuous activity and any activities or movements that cause pain. Be careful during activities and avoid bumping the injured rib.  Gradually increase activity as directed by your caregiver.  Only take over-the-counter or  prescription medications as directed by your caregiver. Do not take other medications without asking your caregiver first.  Apply ice to the injured area for the first 1-2 days after you have been treated or as directed by your caregiver. Applying ice helps to reduce inflammation and pain. ? Put ice in a plastic bag. ? Place a towel between your skin and the bag. ? Leave the ice on for 15-20 minutes at a time, every 2 hours while you are awake.  Perform deep breathing as directed by your caregiver. This will help prevent pneumonia, which is a common complication of a broken rib. Your caregiver may instruct you to: ? Take deep breaths several times a day. ? Try to cough several times a day, holding a pillow against the injured area. ? Use a device called an incentive spirometer to practice deep breathing several times a day.  Drink enough fluids to keep your urine clear or pale yellow. This will help you avoid constipation.  Do not wear a rib belt or binder. These restrict breathing, which can lead to pneumonia. Get help right away if:  You have a fever.  You have difficulty breathing or shortness of breath.  You develop a continual cough, or you cough up thick or bloody sputum.  You feel sick to your stomach (nausea), throw up (vomit), or have abdominal pain.  You have worsening pain not controlled with medications. This information is not intended to replace advice given to you by your health care provider. Make sure  you discuss any questions you have with your health care provider. Document Released: 07/14/2005 Document Revised: 12/20/2015 Document Reviewed: 09/15/2012 Elsevier Interactive Patient Education  [employer].

## 2018-05-17 ENCOUNTER — Other Ambulatory Visit: Payer: Self-pay | Admitting: Internal Medicine

## 2018-05-17 DIAGNOSIS — T148XXA Other injury of unspecified body region, initial encounter: Secondary | ICD-10-CM

## 2018-05-17 DIAGNOSIS — E2839 Other primary ovarian failure: Secondary | ICD-10-CM

## 2018-05-21 DIAGNOSIS — M5412 Radiculopathy, cervical region: Secondary | ICD-10-CM | POA: Diagnosis not present

## 2018-06-02 DIAGNOSIS — M5412 Radiculopathy, cervical region: Secondary | ICD-10-CM | POA: Diagnosis not present

## 2018-06-07 ENCOUNTER — Other Ambulatory Visit: Payer: Self-pay | Admitting: Cardiovascular Disease

## 2018-06-08 DIAGNOSIS — M5412 Radiculopathy, cervical region: Secondary | ICD-10-CM | POA: Diagnosis not present

## 2018-06-09 DIAGNOSIS — D352 Benign neoplasm of pituitary gland: Secondary | ICD-10-CM | POA: Diagnosis not present

## 2018-06-10 DIAGNOSIS — M5412 Radiculopathy, cervical region: Secondary | ICD-10-CM | POA: Diagnosis not present

## 2018-06-14 ENCOUNTER — Telehealth: Payer: Self-pay | Admitting: Internal Medicine

## 2018-06-14 ENCOUNTER — Telehealth: Payer: Self-pay

## 2018-06-14 NOTE — Telephone Encounter (Signed)
Copied from [place] [id number]. Topic: General - Inquiry >> Jun 14, 2018 12:06 PM Scherrie Gerlach wrote: Reason for CRM: pt wants to know if you received the paperwork from insurance company concerning the settlement from her fall at work. Please advise

## 2018-06-14 NOTE — Telephone Encounter (Signed)
Please advise on refill.

## 2018-06-14 NOTE — Telephone Encounter (Signed)
I can no longer refill narcotic medication did this 1x for acute pain  Primary care can only do this x 5 days for pain  Sorry   [place]

## 2018-06-14 NOTE — Telephone Encounter (Unsigned)
Copied from [place] [id number]. Topic: Quick Communication - Rx Refill/Question >> Jun 14, 2018 12:11 PM Scherrie Gerlach wrote: Medication: oxyCODONE (ROXICODONE) 5 MG immediate release tablet  Pt states she is still having pain in her lower right rib and is starting to hurt her again.  Would like you to refill please.  [place] (N), [place] [phone] (Phone) [phone] (Fax)

## 2018-06-15 NOTE — Telephone Encounter (Signed)
No I have not received info from insurance company about a fall at work  Fax # [phone]    [employer]

## 2018-06-16 DIAGNOSIS — D352 Benign neoplasm of pituitary gland: Secondary | ICD-10-CM | POA: Diagnosis not present

## 2018-06-17 DIAGNOSIS — M5412 Radiculopathy, cervical region: Secondary | ICD-10-CM | POA: Diagnosis not present

## 2018-06-17 DIAGNOSIS — R69 Illness, unspecified: Secondary | ICD-10-CM | POA: Diagnosis not present

## 2018-06-18 ENCOUNTER — Encounter: Payer: Self-pay | Admitting: Family Medicine

## 2018-06-18 ENCOUNTER — Ambulatory Visit (INDEPENDENT_AMBULATORY_CARE_PROVIDER_SITE_OTHER): Payer: Medicare HMO | Admitting: Family Medicine

## 2018-06-18 VITALS — BP 100/62 | HR 73 | Temp 97.8°F | Ht 65.0 in | Wt 215.4 lb

## 2018-06-18 DIAGNOSIS — M21611 Bunion of right foot: Secondary | ICD-10-CM | POA: Diagnosis not present

## 2018-06-18 DIAGNOSIS — E119 Type 2 diabetes mellitus without complications: Secondary | ICD-10-CM | POA: Diagnosis not present

## 2018-06-18 DIAGNOSIS — L602 Onychogryphosis: Secondary | ICD-10-CM | POA: Diagnosis not present

## 2018-06-18 DIAGNOSIS — L84 Corns and callosities: Secondary | ICD-10-CM

## 2018-06-18 DIAGNOSIS — M21612 Bunion of left foot: Secondary | ICD-10-CM | POA: Diagnosis not present

## 2018-06-18 NOTE — Telephone Encounter (Signed)
Patient has been informed at appointment

## 2018-06-18 NOTE — Telephone Encounter (Signed)
Patient informed at appointment.

## 2018-06-18 NOTE — Progress Notes (Signed)
Subjective:    Patient ID: [name], female    DOB: 01/09/1961, 57 y.o.   MRN: [id number]  HPI  Presents to clinic due to sore on her foot, bilaterally, right more than left. She is a diabetic, last A1c did show overall good sugar control.  Patient states the sore area rubs on her foot in her shoe, she is opening a Band-Aid over the area to add some cushion.  It is not currently open or draining.  Lab Results  Component Value Date   HGBA1C 6.8 (H) 04/15/2018   Patient Active Problem List   Diagnosis Date Noted  . Fall 05/14/2018  . Anxiety and depression 04/05/2018  . DM2 (diabetes mellitus, type 2) ([place]) 12/18/2017  . Urinary incontinence 12/18/2017  . Lumbar radiculopathy 12/18/2017  . Abnormal feces   . Benign neoplasm of ascending colon   . Intractable vomiting with nausea   . Acute esophagogastric ulcer   . Gastritis without bleeding   . Vasomotor flushing 11/24/2016  . Morbid obesity ([place]) 10/29/2015  . Back ache 06/19/2015  . Colon polyp 06/19/2015  . CCF (congestive cardiac failure) ([place]) 06/19/2015  . Family history of colon cancer 06/19/2015  . Orthostatic hypotension 04/30/2015  . Hematochezia 01/11/2015  . History of colonic polyps 01/11/2015  . Atypical chest pain 01/20/2014  . Diarrhea 06/03/2013  . Bronchitis 01/03/2013  . Right knee pain 12/20/2011  . HTN (hypertension) 12/20/2011  . Systolic CHF, chronic ([place])   . History of cervical cancer   . Depression   . GERD (gastroesophageal reflux disease)   . Seasonal allergies   . History of drug abuse ([place])   . PAD (peripheral artery disease) ([place]) 02/03/2011  . 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 Smoking history 12/16/2010  . Hyperlipidemia 08/23/2010  . Coronary atherosclerosis of native coronary artery 08/23/2010  . CLAUDICATION 08/23/2010  . CAROTID BRUIT 08/23/2010  . SHORTNESS OF BREATH 08/23/2010   Social History   Tobacco Use  . Smoking status: Former Smoker    Packs/day: 1.00    Years:  38.00    Pack years: 38.00    Types: Cigarettes    Last attempt to quit: 08/12/2010    Years since quitting: 7.8  . Smokeless tobacco: Never Used  Substance Use Topics  . Alcohol use: No   Review of Systems  Constitutional: Negative for chills, fatigue and fever.  HENT: Negative for congestion, ear pain, sinus pain and sore throat.   Eyes: Negative.   Respiratory: Negative for cough, shortness of breath and wheezing.   Cardiovascular: Negative for chest pain, palpitations and leg swelling.  Gastrointestinal: Negative for abdominal pain, diarrhea, nausea and vomiting.  Genitourinary: Negative for dysuria, frequency and urgency.  Musculoskeletal: Negative for arthralgias and myalgias.  Skin: +sore on foot from bunions rubbing Neurological: Negative for syncope, light-headedness and headaches.  Psychiatric/Behavioral: The patient is not nervous/anxious.       Objective:   Physical Exam  Constitutional: She is oriented to person, place, and time. No distress.  HENT:  Head: Normocephalic and atraumatic.  Eyes: Conjunctivae and EOM are normal.  Neck: Neck supple. No tracheal deviation present.  Cardiovascular: Normal rate and regular rhythm.  Pulmonary/Chest: Effort normal and breath sounds normal.  Musculoskeletal:       Feet:  Bunions on right and left feet, represented by red circle on diagram.  Skin on right bunion is slightly red and irritated, but it is not open or draining.  Toenails are thickened on  both feet. +callus on balls of both feet, represented by blue circle on diagram.   Neurological: She is alert and oriented to person, place, and time.  Skin: Skin is warm and dry.  Psychiatric: She has a normal mood and affect. Her behavior is normal.  Nursing note and vitals reviewed.  Vitals:   06/18/18 1001  BP: 100/62  Pulse: 73  Temp: 97.8 F (36.6 C)  SpO2: 93%      Assessment & Plan:   Bunion on right and left foot, thickened toenails, callus of feet, type 2 DM  - patient advised to put a Band-Aid over her bunion to avoid rubbing of skin within her shoe.  Patient also advised to rule up a piece of gauze in place in between big toe and second toe to force big toe to be in better alignment.  Patient will see podiatrist for evaluation of her bunions, also for toenail trimming and possible removal of callus on feet.  Patient also aware that is very important for [place] to keep a close eye on her feet due to being a type II diabetic, this is why it will be beneficial for her to see podiatry.  Patient aware she will be contacted in regards to podiatry referral.  Advised to keep regular follow-up as planned with PCP.

## 2018-06-22 DIAGNOSIS — M5412 Radiculopathy, cervical region: Secondary | ICD-10-CM | POA: Diagnosis not present

## 2018-06-22 DIAGNOSIS — M5481 Occipital neuralgia: Secondary | ICD-10-CM | POA: Diagnosis not present

## 2018-06-28 ENCOUNTER — Other Ambulatory Visit: Payer: Self-pay | Admitting: Cardiovascular Disease

## 2018-06-29 DIAGNOSIS — M5412 Radiculopathy, cervical region: Secondary | ICD-10-CM | POA: Diagnosis not present

## 2018-06-30 DIAGNOSIS — R69 Illness, unspecified: Secondary | ICD-10-CM | POA: Diagnosis not present

## 2018-07-01 DIAGNOSIS — R69 Illness, unspecified: Secondary | ICD-10-CM | POA: Diagnosis not present

## 2018-07-02 DIAGNOSIS — M1711 Unilateral primary osteoarthritis, right knee: Secondary | ICD-10-CM | POA: Diagnosis not present

## 2018-07-08 ENCOUNTER — Ambulatory Visit (INDEPENDENT_AMBULATORY_CARE_PROVIDER_SITE_OTHER): Payer: Medicare HMO | Admitting: Internal Medicine

## 2018-07-08 ENCOUNTER — Encounter: Payer: Self-pay | Admitting: Internal Medicine

## 2018-07-08 VITALS — BP 130/80 | HR 64 | Temp 98.7°F | Ht 65.0 in | Wt 215.4 lb

## 2018-07-08 DIAGNOSIS — R69 Illness, unspecified: Secondary | ICD-10-CM | POA: Diagnosis not present

## 2018-07-08 DIAGNOSIS — H6123 Impacted cerumen, bilateral: Secondary | ICD-10-CM

## 2018-07-08 DIAGNOSIS — B370 Candidal stomatitis: Secondary | ICD-10-CM | POA: Diagnosis not present

## 2018-07-08 DIAGNOSIS — R490 Dysphonia: Secondary | ICD-10-CM | POA: Diagnosis not present

## 2018-07-08 DIAGNOSIS — J4521 Mild intermittent asthma with (acute) exacerbation: Secondary | ICD-10-CM

## 2018-07-08 DIAGNOSIS — J329 Chronic sinusitis, unspecified: Secondary | ICD-10-CM | POA: Diagnosis not present

## 2018-07-08 DIAGNOSIS — R05 Cough: Secondary | ICD-10-CM | POA: Diagnosis not present

## 2018-07-08 DIAGNOSIS — R059 Cough, unspecified: Secondary | ICD-10-CM

## 2018-07-08 MED ORDER — IPRATROPIUM-ALBUTEROL 0.5-2.5 (3) MG/3ML IN SOLN: 3.0000 mL | Freq: Four times a day (QID) | RESPIRATORY_TRACT | Status: DC

## 2018-07-08 MED ORDER — IPRATROPIUM BROMIDE 0.02 % IN SOLN
0.5000 mg | Freq: Once | RESPIRATORY_TRACT | Status: AC
  Administered 2018-07-08: 0.5 mg via RESPIRATORY_TRACT

## 2018-07-08 MED ORDER — CLOTRIMAZOLE 10 MG MT TROC: 10.0000 mg | Freq: Every day | OROMUCOSAL | 0 refills | Status: DC

## 2018-07-08 MED ORDER — ALBUTEROL SULFATE (2.5 MG/3ML) 0.083% IN NEBU
2.5000 mg | INHALATION_SOLUTION | Freq: Once | RESPIRATORY_TRACT | Status: AC
  Administered 2018-07-08: 2.5 mg via RESPIRATORY_TRACT

## 2018-07-08 MED ORDER — AMOXICILLIN-POT CLAVULANATE 875-125 MG PO TABS: 1.0000 | ORAL_TABLET | Freq: Two times a day (BID) | ORAL | 0 refills | Status: DC

## 2018-07-08 MED ORDER — IPRATROPIUM BROMIDE 0.02 % IN SOLN: 0.5000 mg | Freq: Four times a day (QID) | RESPIRATORY_TRACT | 12 refills | Status: DC

## 2018-07-08 NOTE — Progress Notes (Signed)
Chief Complaint  Patient presents with  . Follow-up  . Sinusitis   Sick visit  1. C/o fever per pt 99.1, sinus pressure, hoarseness, sore/scratchy throat, reduced taste, cough with yellow to clear phelgm and she felt dizzy Monday though sx's since Friday. Tried Tylenol and Nyquil otc w/o relief.    Review of Systems  Constitutional: Positive for fever.  HENT: Positive for sore throat.        +hoarseness +reduced taste    Eyes: Negative for blurred vision.  Respiratory: Positive for cough and sputum production.   Cardiovascular: Negative for chest pain.   Past Medical History:  Diagnosis Date  . (HFpEF) heart failure with preserved ejection fraction ([place])    a. 06/2016 Echo: EF 50%, no rwma, mild to mod TR.  [name] Allergy   . Anemia   . Arthritis   . Asthma   . Bacterial vaginitis   . Blood in stool   . Brachial neuritis or radiculitis NOS   . 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 being followed by neurosurgery/eye doctor and pcp. monitoring size.causes sinus problems  . Bronchitis   . Cardiac arrhythmia   . Cervical neck pain with evidence of disc disease    C5/6 disease, MRI done late 2012 - no records available  . Chronic pain   . Cocaine abuse, in remission ([place])    clean x 24 years  . Colon polyps   . COPD (chronic obstructive pulmonary disease) ([facility])    many inhalers  . Coronary artery disease    a. PCI of LCX 2003; b. PCI of the LAD 2012 with a (2.5 x 8 mm BMS);  c.s/p CABG 4/12:  L-LAD, S-Dx, S-OM, S-RCA (Dr. Prescott Gum);  d. 01/2012 MV: inf infarct, attenuation, no ischemia.  . Depression   . Diabetes mellitus without complication ([place])   . Family history of colon cancer   . Generalized headaches    frequent  . GERD (gastroesophageal reflux disease)   . Headache   . Heart murmur   . Hematochezia   . Hepatitis    history of hepatitis b  . History of cervical cancer    s/p cryotherapy  . History of drug abuse ([place])    cocaine, marijuana,  clean since 1989  . History of hepatitis B    from eating undercooked liver  . History of MI (myocardial infarction)   . Hyperlipidemia   . Hypertension   . Myocardial infarction [facility]) 2003, 2012  . PAD (peripheral artery disease) ([facility])    s/p Right SFA atherectomy and PTA 01/15/11, normal ABIs 01/2011  . Polyp of colon   . Seasonal allergies   . Sleep apnea    uses cpap  . Smoking history    quit 07/2010  . Systolic CHF, chronic ([facility])    mild: echo 08/2010 - mildly reduced EF 40-45%, mild diffuse hypokinesis  . Thyroid disease   . Urinary incontinence   . Urine incontinence    Past Surgical History:  Procedure Laterality Date  . ABDOMINAL HYSTERECTOMY     2003  . CHOLECYSTECTOMY  1986  . COLONOSCOPY  2008   3 polyps  . COLONOSCOPY WITH PROPOFOL N/A 02/06/2015   Procedure: COLONOSCOPY WITH PROPOFOL;  Surgeon: Lucilla Lame, MD;  Location: [facility] ENDOSCOPY;  Service: Endoscopy;  Laterality: N/A;  . COLONOSCOPY WITH PROPOFOL N/A 01/27/2017   Procedure: COLONOSCOPY WITH PROPOFOL;  Surgeon: Lucilla Lame, MD;  Location: [facility] ENDOSCOPY;  Service: Endoscopy;  Laterality:  N/A;  . CORONARY ANGIOPLASTY     w/ stent placement x2  . CORONARY ARTERY BYPASS GRAFT  2012   Dr [name]  . CORONARY STENT PLACEMENT  2003   S/P MI  . CORONARY STENT PLACEMENT  2007   [place]  . ESOPHAGOGASTRODUODENOSCOPY (EGD) WITH PROPOFOL N/A 02/06/2015   Procedure: ESOPHAGOGASTRODUODENOSCOPY (EGD) WITH PROPOFOL;  Surgeon: Lucilla Lame, MD;  Location: [facility] ENDOSCOPY;  Service: Endoscopy;  Laterality: N/A;  . ESOPHAGOGASTRODUODENOSCOPY (EGD) WITH PROPOFOL N/A 01/27/2017   Procedure: ESOPHAGOGASTRODUODENOSCOPY (EGD) WITH PROPOFOL;  Surgeon: Lucilla Lame, MD;  Location: [facility] ENDOSCOPY;  Service: Endoscopy;  Laterality: N/A;  . FEMORAL ARTERY STENT  10/2010   right sided (Dr. Burt Knack)  . KNEE ARTHROSCOPY WITH MEDIAL MENISECTOMY Right 08/20/2017   Procedure: KNEE ARTHROSCOPY WITH MEDIAL  AND LATERAL MENISECTOMY;  Surgeon: Hessie Knows, MD;  Location: [facility] ORS;  Service: Orthopedics;  Laterality: Right;  . POSTERIOR CERVICAL LAMINECTOMY N/A 03/08/2018   Procedure: POSTERIOR CERVICAL LAMINECTOMY-C7;  Surgeon: Deetta Perla, MD;  Location: [facility] ORS;  Service: Neurosurgery;  Laterality: N/A;  . TUBAL LIGATION     Family History  Problem Relation Age of Onset  . Hypertension Father   . Heart failure Father   . Diabetes Father   . Colon cancer Father 89  . Glaucoma Father   . Cancer Father        colorectal   . Heart disease Father   . Other Father        glaucoma  . Breast cancer Mother 58       breast cancer, late 54's  . Cancer Mother        breast  . Brain cancer Sister   . Arthritis Sister   . Diabetes Sister   . Hypertension Sister   . Kidney disease Sister   . Diabetes Brother   . Hypertension Brother   . Other Sister        brain tumor   . Coronary artery disease Neg Hx   . Stroke Neg Hx    Social History   Socioeconomic History  . Marital status: Legally Separated    Spouse name: Not on file  . Number of children: Not on file  . Years of education: Not on file  . Highest education level: Not on file  Occupational History  . Not on file  Social Needs  . Financial resource strain: Not on file  . Food insecurity:    Worry: Not on file    Inability: Not on file  . Transportation needs:    Medical: Not on file    Non-medical: Not on file  Tobacco Use  . Smoking status: Former Smoker    Packs/day: 1.00    Years: 38.00    Pack years: 38.00    Types: Cigarettes    Last attempt to quit: 08/12/2010    Years since quitting: 7.9  . Smokeless tobacco: Never Used  Substance and Sexual Activity  . Alcohol use: No  . Drug use: No    Types: Cocaine    Comment: Remote Hx (crack cocaine and marijuana)  . Sexual activity: Yes  Lifestyle  . Physical activity:    Days per week: Not on file    Minutes per session: Not on file  . Stress: Not on file  Relationships  . Social connections:     Talks on phone: Not on file    Gets together: Not on file    Attends religious service: Not on file  Active member of club or organization: Not on file    Attends meetings of clubs or organizations: Not on file    Relationship status: Not on file  . Intimate partner violence:    Fear of current or ex partner: Not on file    Emotionally abused: Not on file    Physically abused: Not on file    Forced sexual activity: Not on file  Other Topics Concern  . 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 albuterol (PROVENTIL HFA;VENTOLIN HFA) 108 (90 Base) MCG/ACT inhaler Inhale 2 puffs into the lungs every 6 (six) hours as needed for wheezing or shortness of breath.  [name] aspirin 81 MG tablet Take 1 tablet (81 mg total) by mouth daily.  . Azelastine HCl 0.15 % SOLN Place 2 sprays into both nostrils daily as needed (allergies).   . baclofen (LIORESAL) 10 MG tablet Take 10 mg by mouth 3 (three) times daily.  [name] esomeprazole (NEXIUM) 40 MG capsule Take 40 mg by mouth daily. Reported on 09/04/2015  . ezetimibe (ZETIA) 10 MG tablet TAKE ONE TABLET BY MOUTH ONCE DAILY  . fluticasone (FLONASE) 50 MCG/ACT nasal spray Place 2 sprays into both nostrils daily as needed for allergies.   . furosemide (LASIX) 20 MG tablet TAKE ONE TABLET BY MOUTH ONCE DAILY AND ANOTHER DOSE IF NEEDED FOR FLUID BUILD UP  . glucose blood (ONE TOUCH ULTRA TEST) test strip Use as instructed check cbg qd E11.9  . isosorbide mononitrate (IMDUR) 30 MG 24 hr tablet TAKE ONE TABLET BY MOUTH TWICE DAILY  . Lancets MISC 1 Device by Does not apply route daily. Lancets E11.9   . lisinopril (PRINIVIL,ZESTRIL) 5 MG tablet Take 1 tablet (5 mg total) by mouth daily. (Patient taking differently: Take 5 mg by mouth every evening. At 5 pm)  . meclizine (ANTIVERT) 25 MG tablet Take 25 mg by mouth 3 (three) times daily.   . metFORMIN (GLUCOPHAGE-XR) 500 MG 24 hr tablet Take 1 tablet (500 mg total) by mouth daily with breakfast.  . metoprolol tartrate (LOPRESSOR) 25 MG tablet Take 1 tablet (25 mg total) by mouth 2 (two) times daily.  . nitroGLYCERIN (NITROSTAT) 0.4 MG SL tablet Place 1 tablet (0.4 mg total) under the tongue every 5 (five) minutes as needed. (Patient taking differently: Place 0.4 mg under the tongue every 5 (five) minutes as needed for chest pain. )  . oxyCODONE (ROXICODONE) 5 MG immediate release tablet Take 1 tablet (5 mg total) by mouth 3 (three) times daily as needed for moderate pain or severe pain.  [name] PARoxetine (PAXIL) 10 MG tablet Take 1 tablet (10 mg total) by mouth daily.  . potassium chloride (K-DUR,KLOR-CON) 10 MEQ tablet TAKE 1 TABLET BY MOUTH ONCE DAILY  . rivaroxaban (XARELTO) 2.5 MG TABS tablet Take 1 tablet (2.5 mg total) by mouth 2 (two) times daily.  . rosuvastatin (CRESTOR) 20 MG tablet Take 20 mg by mouth daily.   [name] senna-docusate (SENOKOT-S) 8.6-50 MG tablet Take 1 tablet by mouth  2 (two) times daily.  [name] umeclidinium-vilanterol (ANORO ELLIPTA) 62.5-25 MCG/INH AEPB Inhale 1 puff into the lungs daily.  . vitamin B-12 (CYANOCOBALAMIN) 1000 MCG tablet Take 1,000 mcg by mouth daily.   Allergies  Allergen Reactions  . Latex Rash       . Shellfish Allergy Anaphylaxis    Hard shellfish/swelling in throat  . Sulfonamide Derivatives Anaphylaxis    Swelling in throat.  . Watermelon [Citrullus Vulgaris] Other (See Comments)    Throat itchy  . Other Swelling   Recent Results (from the past 2160 hour(s))  VITAMIN D 25 Hydroxy (Vit-D Deficiency, Fractures)     Status: None   Collection Time: 04/15/18  8:28 AM  Result Value Ref Range   VITD  33.62 30.00 - 100.00 ng/mL  Lipid Profile     Status: Abnormal   Collection Time: 04/15/18  8:28 AM  Result Value Ref Range   Cholesterol 154 0 - 200 mg/dL    Comment: ATP III Classification       Desirable:  < 200 mg/dL               Borderline High:  200 - 239 mg/dL          High:  > = 240 mg/dL   Triglycerides 166.0 (H) 0.0 - 149.0 mg/dL    Comment: Normal:  <150 mg/dLBorderline High:  150 - 199 mg/dL   HDL 40.50 >39.00 mg/dL   VLDL 33.2 0.0 - 40.0 mg/dL   LDL Cholesterol 81 0 - 99 mg/dL   Total CHOL/HDL Ratio 4     Comment:                Men          Women1/2 Average Risk     3.4          3.3Average Risk          5.0          4.42X Average Risk          9.6          7.13X Average Risk          15.0          11.0                       NonHDL 113.70     Comment: NOTE:  Non-HDL goal should be 30 mg/dL higher than patient's LDL goal (i.e. LDL goal of < 70 mg/dL, would have non-HDL goal of < 100 mg/dL)  Hemoglobin A1C     Status: Abnormal   Collection Time: 04/15/18  8:28 AM  Result Value Ref Range   Hgb A1c MFr Bld 6.8 (H) 4.6 - 6.5 %    Comment: Glycemic Control Guidelines for People with Diabetes:Non Diabetic:  <6%Goal of Therapy: <7%Additional Action Suggested:  >8%   Comprehensive metabolic panel     Status: Abnormal   Collection Time: 04/15/18  8:28 AM  Result Value Ref Range   Sodium 139 135 - 145 mEq/L   Potassium 3.3 (L) 3.5 - 5.1 mEq/L   Chloride 101 96 - 112 mEq/L   CO2 27 19 - 32 mEq/L   Glucose, Bld 100 (H) 70 - 99 mg/dL   BUN 14 6 - 23 mg/dL   Creatinine, Ser 1.05 0.40 - 1.20 mg/dL   Total Bilirubin 0.4 0.2 - 1.2 mg/dL   Alkaline Phosphatase 73 39 - 117 U/L   AST 16  0 - 37 U/L   ALT 15 0 - 35 U/L   Total Protein 7.2 6.0 - 8.3 g/dL   Albumin 4.1 3.5 - 5.2 g/dL   Calcium 9.5 8.4 - 10.5 mg/dL   GFR 68.83 >60.00 mL/min   Objective  Body mass index is 35.84 kg/m. Wt Readings from Last 3 Encounters:  07/08/18 215 lb 6.4 oz (97.7 kg)  06/18/18 215 lb 6.4 oz (97.7  kg)  05/14/18 208 lb 12.8 oz (94.7 kg)   Temp Readings from Last 3 Encounters:  07/08/18 98.7 F (37.1 C) (Oral)  06/18/18 97.8 F (36.6 C) (Oral)  05/14/18 98 F (36.7 C) (Oral)   BP Readings from Last 3 Encounters:  07/08/18 130/80  06/18/18 100/62  05/14/18 122/84   Pulse Readings from Last 3 Encounters:  07/08/18 64  06/18/18 73  05/14/18 84    Physical Exam Vitals signs and nursing note reviewed.  Constitutional:      Appearance: Normal appearance. She is normal weight.  HENT:     Head: Normocephalic and atraumatic.     Right Ear: There is impacted cerumen.     Left Ear: There is impacted cerumen.     Mouth/Throat:     Mouth: Mucous membranes are moist.     Tongue: Lesions present.     Comments: +thrush to tongue  Eyes:     Conjunctiva/sclera: Conjunctivae normal.     Pupils: Pupils are equal, round, and reactive to light.  Cardiovascular:     Rate and Rhythm: Normal rate and regular rhythm.     Heart sounds: Normal heart sounds.  Pulmonary:     Effort: Pulmonary effort is normal.     Breath sounds: Normal breath sounds.  Skin:    General: Skin is warm and dry.  Neurological:     General: No focal deficit present.     Mental Status: She is alert and oriented to person, place, and time.     Gait: Gait normal.  Psychiatric:        Attention and Perception: Attention normal.        Mood and Affect: Mood and affect normal.        Speech: Speech normal.        Behavior: Behavior normal. Behavior is cooperative.        Thought Content: Thought content normal.        Cognition and Memory: Cognition normal.        Judgment: Judgment normal.     Assessment   1. Sinusitis with URI h/o asthma likely mild asthma exacerbation, hoarseness likely related  2. Thrush 3. HM 4. Cerumen b/l ears not impacted  Plan   1.  Augmentin  Cold handout Voice rest supportive care  Call back if not better  2. Clotrimazole lozengers  3.  Had flu shotutd -will need  Tdap and shingrix in future  pna 23 had 03/10/16  S/p hysterectomy no cervix and f/u OB/GYNh/o abnormal pap s/p cryo  -pap 05/08/15 neg papwill  Repeat In 5 years   Colonoscopy Dr. Allen Norris 01/2017 polyps, gastritis   Mammogram had 09/29/17 uni right negdue 3/5/2020h/o benign right breast calcifications  Consider DEXA in future h/o rib fracture   CT chest had 06/12/16 with chronic changes lung bases otherwise normal former smoker  CT chest 06/16/17  IMPRESSION: 1. Cardiomegaly with left main and three-vessel coronary arteriosclerosis. Status post CABG. 2. No acute pulmonary embolus, aortic aneurysm or dissection. 3. Bibasilar atelectasis.  Trace bilateral pleural  effusions. 4. No acute osseous abnormality.  Aortic Atherosclerosis (ICD10-I70.0).  Had thyroid labs 01/2018 care everywhere normal  4. Removed ear wax with currette   Saw Dr. Lacinda Axon NS 06/22/18 left occipital neuralgia rec PT, heat, baclofen and neck stretching if left arm sxs continue consider repeat MRI  Provider: Dr. Olivia Mackie [name]-Internal Medicine

## 2018-07-08 NOTE — Patient Instructions (Signed)
Oral Thrush, Adult Oral thrush, also called oral candidiasis, is a fungal infection that develops in the mouth and throat and on the tongue. It causes white patches to form on the mouth and tongue. Thrush is most common in older adults, but it can occur at any age. Many cases of thrush are mild, but this infection can also be serious. Thrush can be a repeated (recurrent) problem for certain people who have a weak body defense system (immune system). The weakness can be caused by chronic illnesses, or by taking medicines that limit the body's ability to fight infection. If a person has difficulty fighting infection, the fungus that causes thrush can spread through the body. This can cause life-threatening blood or organ infections. What are the causes? This condition is caused by a fungus (yeast) called Candida albicans.  This fungus is normally present in small amounts in the mouth and on other mucous membranes. It usually causes no harm.  If conditions are present that allow the fungus to grow without control, it invades surrounding tissues and becomes an infection.  Other Candida species can also lead to thrush (rare).  What increases the risk? This condition is more likely to develop in:  People with a weakened immune system.  Older adults.  People with HIV (human immunodeficiency virus).  People with diabetes.  People with dry mouth (xerostomia).  Pregnant women.  People with poor dental care, especially people who have false teeth.  People who use antibiotic medicines.  What are the signs or symptoms? Symptoms of this condition can vary from mild and moderate to severe and persistent. Symptoms may include:  A burning feeling in the mouth and throat. This can occur at the start of a thrush infection.  White patches that stick to the mouth and tongue. The tissue around the patches may be red, raw, and painful. If rubbed (during tooth brushing, for example), the patches and the  tissue of the mouth may bleed easily.  A bad taste in the mouth or difficulty tasting foods.  A cottony feeling in the mouth.  Pain during eating and swallowing.  Poor appetite.  Cracking at the corners of the mouth.  How is this diagnosed? This condition is diagnosed based on:  Physical exam. Your health care provider will look in your mouth.  Health history. Your health care provider will ask you questions about your health.  How is this treated? This condition is treated with medicines called antifungals, which prevent the growth of fungi. These medicines are either applied directly to the affected area (topical) or swallowed (oral). The treatment will depend on the severity of the condition. Mild thrush Mild cases of thrush may clear up with the use of an antifungal mouth rinse or lozenges. Treatment usually lasts about 14 days. Moderate to severe thrush  More severe thrush infections that have spread to the esophagus are treated with an oral antifungal medicine. A topical antifungal medicine may also be used.  For some severe infections, treatment may need to continue for more than 14 days.  Oral antifungal medicines are rarely used during pregnancy because they may be harmful to the unborn child. If you are pregnant, talk with your health care provider about options for treatment. Persistent or recurrent thrush For cases of thrush that do not go away or keep coming back:  Treatment may be needed twice as long as the symptoms last.  Treatment will include both oral and topical antifungal medicines.  People with a weakened immune   system can take an antifungal medicine on a continuous basis to prevent thrush infections.  It is important to treat conditions that make a person more likely to get thrush, such as diabetes or HIV. Follow these instructions at home: Medicines  Take over-the-counter and prescription medicines only as told by your health care provider.  Talk  with your health care provider about an over-the-counter medicine called gentian violet, which kills bacteria and fungi. Relieving soreness and discomfort To help reduce the discomfort of thrush:  Drink cold liquids such as water or iced tea.  Try flavored ice treats or frozen juices.  Eat foods that are easy to swallow, such as gelatin, ice cream, or custard.  Try drinking from a straw if the patches in your mouth are painful.  General instructions  Eat plain, unflavored yogurt as directed by your health care provider. Check the label to make sure the yogurt contains live cultures. This yogurt can help healthy bacteria to grow in the mouth and can stop the growth of the fungus that causes thrush.  If you wear dentures, remove the dentures before going to bed, brush them vigorously, and soak them in a cleaning solution as directed by your health care provider.  Rinse your mouth with a warm salt-water mixture several times a day. To make a salt-water mixture, completely dissolve 1/2-1 tsp of salt in 1 cup of warm water. Contact a health care provider if:  Your symptoms are getting worse or are not improving within 7 days of starting treatment.  You have symptoms of a spreading infection, such as white patches on the skin outside of the mouth. This information is not intended to replace advice given to you by your health care provider. Make sure you discuss any questions you have with your health care provider. Document Released: 04/08/2004 Document Revised: 04/07/2016 Document Reviewed: 04/07/2016 Elsevier Interactive Patient Education  2017 [employer]  Sinusitis, Adult Sinusitis is soreness and inflammation of your sinuses. Sinuses are hollow spaces in the bones around your face. Your sinuses are located:  Around your eyes.  In the middle of your forehead.  Behind your nose.  In your cheekbones.  Your sinuses and nasal passages are lined with a stringy fluid (mucus). Mucus  normally drains out of your sinuses. When your nasal tissues become inflamed or swollen, the mucus can become trapped or blocked so air cannot flow through your sinuses. This allows bacteria, viruses, and funguses to grow, which leads to infection. Sinusitis can develop quickly and last for 7?10 days (acute) or for more than 12 weeks (chronic). Sinusitis often develops after a cold. What are the causes? This condition is caused by anything that creates swelling in the sinuses or stops mucus from draining, including:  Allergies.  Asthma.  Bacterial or viral infection.  Abnormally shaped bones between the nasal passages.  Nasal growths that contain mucus (nasal polyps).  Narrow sinus openings.  Pollutants, such as chemicals or irritants in the air.  A foreign object stuck in the nose.  A fungal infection. This is rare.  What increases the risk? The following factors may make you more likely to develop this condition:  Having allergies or asthma.  Having had a recent cold or respiratory tract infection.  Having structural deformities or blockages in your nose or sinuses.  Having a weak immune system.  Doing a lot of swimming or diving.  Overusing nasal sprays.  Smoking.  What are the signs or symptoms? The main symptoms of this  condition are pain and a feeling of pressure around the affected sinuses. Other symptoms include:  Upper toothache.  Earache.  Headache.  Bad breath.  Decreased sense of smell and taste.  A cough that may get worse at night.  Fatigue.  Fever.  Thick drainage from your nose. The drainage is often green and it may contain pus (purulent).  Stuffy nose or congestion.  Postnasal drip. This is when extra mucus collects in the throat or back of the nose.  Swelling and warmth over the affected sinuses.  Sore throat.  Sensitivity to light.  How is this diagnosed? This condition is diagnosed based on symptoms, a medical history, and a  physical exam. To find out if your condition is acute or chronic, your health care provider may:  Look in your nose for signs of nasal polyps.  Tap over the affected sinus to check for signs of infection.  View the inside of your sinuses using an imaging device that has a light attached (endoscope).  If your health care provider suspects that you have chronic sinusitis, you may also:  Be tested for allergies.  Have a sample of mucus taken from your nose (nasal culture) and checked for bacteria.  Have a mucus sample examined to see if your sinusitis is related to an allergy.  If your sinusitis does not respond to treatment and it lasts longer than 8 weeks, you may have an MRI or CT scan to check your sinuses. These scans also help to determine how severe your infection is. In rare cases, a bone biopsy may be done to rule out more serious types of fungal sinus disease. How is this treated? Treatment for sinusitis depends on the cause and whether your condition is chronic or acute. If a virus is causing your sinusitis, your symptoms will go away on their own within 10 days. You may be given medicines to relieve your symptoms, including:  Topical nasal decongestants. They shrink swollen nasal passages and let mucus drain from your sinuses.  Antihistamines. These drugs block inflammation that is triggered by allergies. This can help to ease swelling in your nose and sinuses.  Topical nasal corticosteroids. These are nasal sprays that ease inflammation and swelling in your nose and sinuses.  Nasal saline washes. These rinses can help to get rid of thick mucus in your nose.  If your condition is caused by bacteria, you will be given an antibiotic medicine. If your condition is caused by a fungus, you will be given an antifungal medicine. Surgery may be needed to correct underlying conditions, such as narrow nasal passages. Surgery may also be needed to remove polyps. Follow these instructions  at home: Medicines  Take, use, or apply over-the-counter and prescription medicines only as told by your health care provider. These may include nasal sprays.  If you were prescribed an antibiotic medicine, take it as told by your health care provider. Do not stop taking the antibiotic even if you start to feel better. Hydrate and Humidify  Drink enough water to keep your urine clear or pale yellow. Staying hydrated will help to thin your mucus.  Use a cool mist humidifier to keep the humidity level in your home above 50%.  Inhale steam for 10-15 minutes, 3-4 times a day or as told by your health care provider. You can do this in the bathroom while a hot shower is running.  Limit your exposure to cool or dry air. Rest  Rest as much as possible.  Sleep with your head raised (elevated).  Make sure to get enough sleep each night. General instructions  Apply a warm, moist washcloth to your face 3-4 times a day or as told by your health care provider. This will help with discomfort.  Wash your hands often with soap and water to reduce your exposure to viruses and other germs. If soap and water are not available, use hand sanitizer.  Do not smoke. Avoid being around people who are smoking (secondhand smoke).  Keep all follow-up visits as told by your health care provider. This is important. Contact a health care provider if:  You have a fever.  Your symptoms get worse.  Your symptoms do not improve within 10 days. Get help right away if:  You have a severe headache.  You have persistent vomiting.  You have pain or swelling around your face or eyes.  You have vision problems.  You develop confusion.  Your neck is stiff.  You have trouble breathing. This information is not intended to replace advice given to you by your health care provider. Make sure you discuss any questions you have with your health care provider. Document Released: 07/14/2005 Document Revised:  03/09/2016 Document Reviewed: 05/09/2015 Elsevier Interactive Patient Education  [employer].

## 2018-07-08 NOTE — Progress Notes (Signed)
Pre visit review using our clinic review tool, if applicable. No additional management support is needed unless otherwise documented below in the visit note. 

## 2018-07-09 ENCOUNTER — Other Ambulatory Visit: Payer: Self-pay | Admitting: Internal Medicine

## 2018-07-09 ENCOUNTER — Telehealth: Payer: Self-pay

## 2018-07-09 DIAGNOSIS — J4520 Mild intermittent asthma, uncomplicated: Secondary | ICD-10-CM

## 2018-07-09 DIAGNOSIS — R69 Illness, unspecified: Secondary | ICD-10-CM | POA: Diagnosis not present

## 2018-07-09 MED ORDER — IPRATROPIUM-ALBUTEROL 0.5-2.5 (3) MG/3ML IN SOLN: 3.0000 mL | Freq: Two times a day (BID) | RESPIRATORY_TRACT | 12 refills | Status: DC | PRN

## 2018-07-09 NOTE — Telephone Encounter (Signed)
Juliann Pulse pt coming Monday to get pediatric neb    Thanks Carey

## 2018-07-09 NOTE — Telephone Encounter (Signed)
Copied from [place] [id number]. Topic: Quick Communication - See Telephone Encounter >> Jul 09, 2018 10:11 AM Reyne Dumas L wrote: CRM for notification. See Telephone encounter for: 07/09/18.  Pt states she was given RX for nebulizer supplies but she doesn't have a nebulizer.  Pt states that she wants to know if this can be ordered so it can go through insurance, she can not afford it out of pocket. Pt can be reached at [phone]

## 2018-07-12 ENCOUNTER — Ambulatory Visit (INDEPENDENT_AMBULATORY_CARE_PROVIDER_SITE_OTHER): Payer: Medicare HMO | Admitting: Family Medicine

## 2018-07-12 ENCOUNTER — Encounter: Payer: Self-pay | Admitting: Family Medicine

## 2018-07-12 VITALS — BP 110/78 | HR 73 | Temp 98.4°F | Resp 16 | Ht 65.0 in | Wt 212.0 lb

## 2018-07-12 DIAGNOSIS — J329 Chronic sinusitis, unspecified: Secondary | ICD-10-CM | POA: Diagnosis not present

## 2018-07-12 DIAGNOSIS — J4520 Mild intermittent asthma, uncomplicated: Secondary | ICD-10-CM | POA: Diagnosis not present

## 2018-07-12 DIAGNOSIS — B370 Candidal stomatitis: Secondary | ICD-10-CM | POA: Diagnosis not present

## 2018-07-12 DIAGNOSIS — N898 Other specified noninflammatory disorders of vagina: Secondary | ICD-10-CM | POA: Diagnosis not present

## 2018-07-12 MED ORDER — ACIDOPHILUS PROBIOTIC 10 MG PO CAPS: 1.0000 | ORAL_CAPSULE | Freq: Every day | ORAL | 2 refills | Status: DC

## 2018-07-12 MED ORDER — FLUCONAZOLE 150 MG PO TABS: ORAL_TABLET | ORAL | 0 refills | Status: DC

## 2018-07-12 NOTE — Progress Notes (Signed)
Subjective:    Patient ID: [name], female    DOB: 22-Jun-1961, 57 y.o.   MRN: [id number]  HPI  Presents to clinic due to continued thrush, pain on tongue. Currently on augmentin for sinus infection and clotrimazole lonzenges for oral thrush. She also has hx of asthma and will be starting home nebulizer treatments, she picked up nebulizer machine today.  Patient also states she is having some itching in vaginal area, often this will happen while taking the antibiotic.  Denies any fever or chills.  Denies any wheezing more than usual, has been using her albuterol inhaler at home and is looking forward to having the nebulizer machine as an option as well.  Denies any phlegm with cough.   Patient Active Problem List   Diagnosis Date Noted  . Fall 05/14/2018  . Anxiety and depression 04/05/2018  . DM2 (diabetes mellitus, type 2) ([place]) 12/18/2017  . Urinary incontinence 12/18/2017  . Lumbar radiculopathy 12/18/2017  . Abnormal feces   . Benign neoplasm of ascending colon   . Intractable vomiting with nausea   . Acute esophagogastric ulcer   . Gastritis without bleeding   . Vasomotor flushing 11/24/2016  . Morbid obesity ([place]) 10/29/2015  . Back ache 06/19/2015  . Colon polyp 06/19/2015  . CCF (congestive cardiac failure) ([place]) 06/19/2015  . Family history of colon cancer 06/19/2015  . Orthostatic hypotension 04/30/2015  . Hematochezia 01/11/2015  . History of colonic polyps 01/11/2015  . Atypical chest pain 01/20/2014  . Diarrhea 06/03/2013  . Bronchitis 01/03/2013  . Right knee pain 12/20/2011  . HTN (hypertension) 12/20/2011  . Systolic CHF, chronic ([place])   . History of cervical cancer   . Depression   . GERD (gastroesophageal reflux disease)   . Seasonal allergies   . History of drug abuse ([place])   . PAD (peripheral artery disease) ([place]) 02/03/2011  . 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 Smoking history 12/16/2010  . Hyperlipidemia 08/23/2010  . Coronary atherosclerosis  of native coronary artery 08/23/2010  . CLAUDICATION 08/23/2010  . CAROTID BRUIT 08/23/2010  . SHORTNESS OF BREATH 08/23/2010   Social History   Tobacco Use  . Smoking status: Former Smoker    Packs/day: 1.00    Years: 38.00    Pack years: 38.00    Types: Cigarettes    Last attempt to quit: 08/12/2010    Years since quitting: 7.9  . Smokeless tobacco: Never Used  Substance Use Topics  . Alcohol use: No   Review of Systems  Constitutional: Negative for chills, fatigue and fever.  HENT: Negative for congestion, ear pain, sinus pain and sore throat. +thrush on tongue  Eyes: Negative.   Respiratory: Negative for cough, shortness of breath and wheezing.   Cardiovascular: Negative for chest pain, palpitations and leg swelling.  Gastrointestinal: Negative for abdominal pain, diarrhea, nausea and vomiting.  Genitourinary: Negative for dysuria, frequency and urgency. +some vaginal itch Musculoskeletal: Negative for arthralgias and myalgias.  Skin: Negative for color change, pallor and rash.  Neurological: Negative for syncope, light-headedness and headaches.  Psychiatric/Behavioral: The patient is not nervous/anxious.       Objective:   Physical Exam Constitutional:      General: She is not in acute distress.    Appearance: She is not toxic-appearing.  HENT:     Head: Normocephalic.     Right Ear: Tympanic membrane and ear canal normal.     Left Ear: Tympanic membrane and ear canal normal.  Mouth/Throat:     Mouth: Mucous membranes are moist.     Comments: Thin white film on tongue  Eyes:     General: No scleral icterus.    Extraocular Movements: Extraocular movements intact.     Conjunctiva/sclera: Conjunctivae normal.  Cardiovascular:     Rate and Rhythm: Normal rate and regular rhythm.     Heart sounds: Normal heart sounds.  Pulmonary:     Effort: Pulmonary effort is normal.     Breath sounds: Wheezing (faint expiratory wheeze) present.  Musculoskeletal:     Right  lower leg: No edema.     Left lower leg: No edema.  Skin:    General: Skin is warm and dry.     Coloration: Skin is not jaundiced or pale.  Neurological:     Mental Status: She is alert and oriented to person, place, and time.  Psychiatric:        Mood and Affect: Mood normal.        Behavior: Behavior normal.    Today's Vitals   07/12/18 1037  BP: 110/78  Pulse: 73  Resp: 16  Temp: 98.4 F (36.9 C)  TempSrc: Oral  SpO2: 94%  Weight: 212 lb (96.2 kg)  Height: 5\' 5"  (1.651 m)   Body mass index is 35.28 kg/m.     Assessment & Plan:   Oral thrush/vaginal itching - patient advised to continue close treatments will lozenges to help oral thrush, we will also have her take 2 doses of Diflucan to help resolve oral thrush as well as treat development of vaginal yeast infection related to antibiotic use.  Mild intermittent asthma - patient will use nebulizer treatments as prescribed by PCP.  Patient is looking forward to having a nebulizer machine.  Sinusitis -patient advised to continue Augmentin as prescribed, also will add in daily probiotic tablet to help offset the thrush/vaginal yeast/ABD upset that can happen when on antibiotic  Keep regular follow up with PCP as planned. RTC sooner if issues arise.

## 2018-07-12 NOTE — Telephone Encounter (Signed)
Patient for Nebulizer , but patient was  seen by NP due to feeling any better.

## 2018-07-19 ENCOUNTER — Encounter: Payer: Self-pay | Admitting: Podiatry

## 2018-07-19 ENCOUNTER — Ambulatory Visit (INDEPENDENT_AMBULATORY_CARE_PROVIDER_SITE_OTHER): Payer: Medicare HMO | Admitting: Podiatry

## 2018-07-19 ENCOUNTER — Ambulatory Visit (INDEPENDENT_AMBULATORY_CARE_PROVIDER_SITE_OTHER): Payer: Medicare HMO

## 2018-07-19 VITALS — BP 107/67 | HR 77 | Resp 16

## 2018-07-19 DIAGNOSIS — M21612 Bunion of left foot: Secondary | ICD-10-CM

## 2018-07-19 DIAGNOSIS — M21611 Bunion of right foot: Secondary | ICD-10-CM | POA: Diagnosis not present

## 2018-07-19 DIAGNOSIS — I739 Peripheral vascular disease, unspecified: Secondary | ICD-10-CM | POA: Diagnosis not present

## 2018-07-19 NOTE — Progress Notes (Signed)
Subjective:  Patient ID: [name], female    DOB: 1961-05-26,  MRN: [id number] HPI Chief Complaint  Patient presents with  . Foot Pain    BL great toes joint pain R>L x years; 9/10 sharp intermittent pain w/ callus buildup -worst with shoes Tx: none   . Callouses    R hallux callus trimming    57 y.o. female presents with the above complaint.   ROS: Denies fever chills nausea vomiting muscle aches pains calf pain back pain chest pain shortness of breath.  Past Medical History:  Diagnosis Date  . (HFpEF) heart failure with preserved ejection fraction ([place])    a. 06/2016 Echo: EF 50%, no rwma, mild to mod TR.  [name] Allergy   . Anemia   . Arthritis   . Asthma   . Bacterial vaginitis   . Blood in stool   . Brachial neuritis or radiculitis NOS   . Brain tumor (benign) ([place]) 07/2017   near optic nerve. being followed by neurosurgery/eye doctor and pcp. monitoring size.causes sinus problems  . Bronchitis   . Cardiac arrhythmia   . Cervical neck pain with evidence of disc disease    C5/6 disease, MRI done late 2012 - no records available  . Chronic pain   . Cocaine abuse, in remission ([place])    clean x 24 years  . Colon polyps   . COPD (chronic obstructive pulmonary disease) (HCC)    many inhalers  . Coronary artery disease    a. PCI of LCX 2003; b. PCI of the LAD 2012 with a (2.5 x 8 mm BMS);  c.s/p CABG 4/12:  L-LAD, S-Dx, S-OM, S-RCA (Dr. Prescott Gum);  d. 01/2012 MV: inf infarct, attenuation, no ischemia.  . Depression   . Diabetes mellitus without complication ([place])   . Family history of colon cancer   . Generalized headaches    frequent  . GERD (gastroesophageal reflux disease)   . Headache   . Heart murmur   . Hematochezia   . Hepatitis    history of hepatitis b  . History of cervical cancer    s/p cryotherapy  . History of drug abuse ([place])    cocaine, marijuana, clean since 1989  . History of hepatitis B    from eating undercooked liver  . History of MI  (myocardial infarction)   . Hyperlipidemia   . Hypertension   . Myocardial infarction [facility]) 2003, 2012  . PAD (peripheral artery disease) (HCC)    s/p Right SFA atherectomy and PTA 01/15/11, normal ABIs 01/2011  . Polyp of colon   . Seasonal allergies   . Sleep apnea    uses cpap  . Smoking history    quit 07/2010  . Systolic CHF, chronic (HCC)    mild: echo 08/2010 - mildly reduced EF 40-45%, mild diffuse hypokinesis  . Thyroid disease   . Urinary incontinence   . Urine incontinence    Past Surgical History:  Procedure Laterality Date  . ABDOMINAL HYSTERECTOMY     2003  . CHOLECYSTECTOMY  1986  . COLONOSCOPY  2008   3 polyps  . COLONOSCOPY WITH PROPOFOL N/A 02/06/2015   Procedure: COLONOSCOPY WITH PROPOFOL;  Surgeon: Lucilla Lame, MD;  Location: [facility] ENDOSCOPY;  Service: Endoscopy;  Laterality: N/A;  . COLONOSCOPY WITH PROPOFOL N/A 01/27/2017   Procedure: COLONOSCOPY WITH PROPOFOL;  Surgeon: Lucilla Lame, MD;  Location: [facility] ENDOSCOPY;  Service: Endoscopy;  Laterality: N/A;  . CORONARY ANGIOPLASTY     w/ stent placement  x2  . CORONARY ARTERY BYPASS GRAFT  2012   Dr [name]  . CORONARY STENT PLACEMENT  2003   S/P MI  . CORONARY STENT PLACEMENT  2007   [place]  . ESOPHAGOGASTRODUODENOSCOPY (EGD) WITH PROPOFOL N/A 02/06/2015   Procedure: ESOPHAGOGASTRODUODENOSCOPY (EGD) WITH PROPOFOL;  Surgeon: Lucilla Lame, MD;  Location: [facility] ENDOSCOPY;  Service: Endoscopy;  Laterality: N/A;  . ESOPHAGOGASTRODUODENOSCOPY (EGD) WITH PROPOFOL N/A 01/27/2017   Procedure: ESOPHAGOGASTRODUODENOSCOPY (EGD) WITH PROPOFOL;  Surgeon: Lucilla Lame, MD;  Location: [facility] ENDOSCOPY;  Service: Endoscopy;  Laterality: N/A;  . FEMORAL ARTERY STENT  10/2010   right sided (Dr. Burt Knack)  . KNEE ARTHROSCOPY WITH MEDIAL MENISECTOMY Right 08/20/2017   Procedure: KNEE ARTHROSCOPY WITH MEDIAL  AND LATERAL MENISECTOMY;  Surgeon: Hessie Knows, MD;  Location: [facility] ORS;  Service: Orthopedics;  Laterality: Right;  . POSTERIOR  CERVICAL LAMINECTOMY N/A 03/08/2018   Procedure: POSTERIOR CERVICAL LAMINECTOMY-C7;  Surgeon: Deetta Perla, MD;  Location: [facility] ORS;  Service: Neurosurgery;  Laterality: N/A;  . TUBAL LIGATION      Current Outpatient Medications:  .  albuterol (PROVENTIL HFA;VENTOLIN HFA) 108 (90 Base) MCG/ACT inhaler, Inhale 2 puffs into the lungs every 6 (six) hours as needed for wheezing or shortness of breath., Disp: 1 Inhaler, Rfl: 3 .  aspirin 81 MG tablet, Take 1 tablet (81 mg total) by mouth daily., Disp: 90 tablet, Rfl: 3 .  Azelastine HCl 0.15 % SOLN, Place 2 sprays into both nostrils daily as needed (allergies). , Disp: , Rfl:  .  baclofen (LIORESAL) 10 MG tablet, Take 10 mg by mouth 3 (three) times daily., Disp: , Rfl:  .  clotrimazole (MYCELEX) 10 MG troche, Take 1 tablet (10 mg total) by mouth 5 (five) times daily., Disp: 35 tablet, Rfl: 0 .  esomeprazole (NEXIUM) 40 MG capsule, Take 40 mg by mouth daily. Reported on 09/04/2015, Disp: , Rfl:  .  ezetimibe (ZETIA) 10 MG tablet, TAKE ONE TABLET BY MOUTH ONCE DAILY, Disp: 90 tablet, Rfl: 3 .  fluconazole (DIFLUCAN) 150 MG tablet, Take 1st tablet on day 1, take 2nd tablet on day 4, Disp: 2 tablet, Rfl: 0 .  fluticasone (FLONASE) 50 MCG/ACT nasal spray, Place 2 sprays into both nostrils daily as needed for allergies. , Disp: , Rfl:  .  furosemide (LASIX) 20 MG tablet, TAKE ONE TABLET BY MOUTH ONCE DAILY AND ANOTHER DOSE IF NEEDED FOR FLUID BUILD UP, Disp: 60 tablet, Rfl: 0 .  glucose blood (ONE TOUCH ULTRA TEST) test strip, Use as instructed check cbg qd E11.9, Disp: 100 each, Rfl: 12 .  ipratropium-albuterol (DUONEB) 0.5-2.5 (3) MG/3ML SOLN, Take 3 mLs by nebulization 2 (two) times daily as needed., Disp: 360 mL, Rfl: 12 .  isosorbide mononitrate (IMDUR) 30 MG 24 hr tablet, TAKE ONE TABLET BY MOUTH TWICE DAILY, Disp: 180 tablet, Rfl: 3 .  Lactobacillus (ACIDOPHILUS PROBIOTIC) 10 MG CAPS, Take 1 capsule by mouth daily., Disp: 30 capsule, Rfl: 2 .  Lancets  MISC, 1 Device by Does not apply route daily. Lancets E11.9, Disp: 90 each, Rfl: 3 .  lisinopril (PRINIVIL,ZESTRIL) 5 MG tablet, Take 1 tablet (5 mg total) by mouth daily. (Patient taking differently: Take 5 mg by mouth every evening. At 5 pm), Disp: 90 tablet, Rfl: 3 .  meclizine (ANTIVERT) 25 MG tablet, Take 25 mg by mouth 3 (three) times daily. , Disp: , Rfl: 3 .  metFORMIN (GLUCOPHAGE-XR) 500 MG 24 hr tablet, Take 1 tablet (500 mg total) by mouth daily with  breakfast., Disp: 90 tablet, Rfl: 3 .  metoprolol tartrate (LOPRESSOR) 25 MG tablet, Take 1 tablet (25 mg total) by mouth 2 (two) times daily., Disp: 180 tablet, Rfl: 3 .  nitroGLYCERIN (NITROSTAT) 0.4 MG SL tablet, Place 1 tablet (0.4 mg total) under the tongue every 5 (five) minutes as needed. (Patient taking differently: Place 0.4 mg under the tongue every 5 (five) minutes as needed for chest pain. ), Disp: 25 tablet, Rfl: 6 .  PARoxetine (PAXIL) 10 MG tablet, Take 1 tablet (10 mg total) by mouth daily., Disp: 30 tablet, Rfl: 11 .  potassium chloride (K-DUR,KLOR-CON) 10 MEQ tablet, TAKE 1 TABLET BY MOUTH ONCE DAILY, Disp: 90 tablet, Rfl: 2 .  rivaroxaban (XARELTO) 2.5 MG TABS tablet, Take 1 tablet (2.5 mg total) by mouth 2 (two) times daily., Disp: 60 tablet, Rfl: 11 .  rosuvastatin (CRESTOR) 20 MG tablet, Take 20 mg by mouth daily. , Disp: , Rfl:  .  senna-docusate (SENOKOT-S) 8.6-50 MG tablet, Take 1 tablet by mouth 2 (two) times daily., Disp: 30 tablet, Rfl: 0 .  umeclidinium-vilanterol (ANORO ELLIPTA) 62.5-25 MCG/INH AEPB, Inhale 1 puff into the lungs daily., Disp: 60 each, Rfl: 10  Allergies  Allergen Reactions  . Latex Rash       . Shellfish Allergy Anaphylaxis    Hard shellfish/swelling in throat  . Sulfonamide Derivatives Anaphylaxis    Swelling in throat.  . Watermelon [Citrullus Vulgaris] Other (See Comments)    Throat itchy  . Other Swelling   Review of Systems Objective:   Vitals:   07/19/18 1531  BP: 107/67    Pulse: 77  Resp: 16    General: Well developed, nourished, in no acute distress, alert and oriented x3   Dermatological: Skin is warm, dry and supple bilateral. Nails x 10 are well maintained; remaining integument appears unremarkable at this time. There are no open sores, no preulcerative lesions, no rash or signs of infection present.  Vascular: Dorsalis Pedis artery and Posterior Tibial artery pedal pulses are 2/4 left with immedate capillary fill time.  Dorsalis pedis is diminished dorsal aspect right foot and nonpalpable tibial pulse.  Pedal hair growth present. No varicosities and no lower extremity edema present bilateral.   Neruologic: Grossly intact via light touch bilateral. Vibratory intact via tuning fork bilateral. Protective threshold with Semmes Wienstein monofilament intact to all pedal sites bilateral. Patellar and Achilles deep tendon reflexes 2+ bilateral. No Babinski or clonus noted bilateral.   Musculoskeletal: No gross boney pedal deformities bilateral. No pain, crepitus, or limitation noted with foot and ankle range of motion bilateral. Muscular strength 5/5 in all groups tested bilateral.  Mild pes planus with hallux valgus bilateral appears to be track bound rather than just tracking.  The deformity is nonreducible.  She has pain on palpation of the hypertrophic medial condyle of the first metatarsals bilateral.  Gait: Unassisted, Nonantalgic.    Radiographs:  Hypertrophic medial condyle to the head of the first metatarsal bilateral.  With increase in the first intermetatarsal angle greater than normal value as well as a hallux abductus angle greater than normal value early dislocation is noted with early osteoarthritic changes and mild pes planus.  Assessment & Plan:   Assessment: Peripheral vascular disease right lower extremity.  Hallux valgus bilateral.  Right seems to be worse than the left.  Plan: Currently we are going to send her for vascular evaluation  prior to discussing surgical consideration.     Max T. [place], [place]

## 2018-08-04 ENCOUNTER — Other Ambulatory Visit: Payer: Self-pay | Admitting: Cardiovascular Disease

## 2018-08-16 ENCOUNTER — Other Ambulatory Visit: Payer: Self-pay | Admitting: Podiatry

## 2018-08-16 DIAGNOSIS — I739 Peripheral vascular disease, unspecified: Secondary | ICD-10-CM

## 2018-08-17 ENCOUNTER — Other Ambulatory Visit: Payer: Self-pay | Admitting: Cardiovascular Disease

## 2018-08-18 ENCOUNTER — Ambulatory Visit (INDEPENDENT_AMBULATORY_CARE_PROVIDER_SITE_OTHER): Payer: Medicare HMO

## 2018-08-18 DIAGNOSIS — I739 Peripheral vascular disease, unspecified: Secondary | ICD-10-CM

## 2018-08-24 ENCOUNTER — Other Ambulatory Visit: Payer: Self-pay | Admitting: Cardiovascular Disease

## 2018-08-25 ENCOUNTER — Telehealth: Payer: Self-pay | Admitting: *Deleted

## 2018-08-25 NOTE — Telephone Encounter (Signed)
-----   Message from Garrel Ridgel, [place] sent at 08/24/2018  7:21 AM EST ----- Vascular eval looks ok.

## 2018-08-25 NOTE — Telephone Encounter (Signed)
Left message requesting a call back to discuss results. 

## 2018-08-26 ENCOUNTER — Ambulatory Visit (INDEPENDENT_AMBULATORY_CARE_PROVIDER_SITE_OTHER): Payer: Medicare HMO | Admitting: Internal Medicine

## 2018-08-26 ENCOUNTER — Encounter: Payer: Self-pay | Admitting: Internal Medicine

## 2018-08-26 VITALS — BP 100/68 | HR 82 | Temp 98.2°F | Ht 65.0 in | Wt 210.8 lb

## 2018-08-26 DIAGNOSIS — E119 Type 2 diabetes mellitus without complications: Secondary | ICD-10-CM | POA: Diagnosis not present

## 2018-08-26 DIAGNOSIS — M79661 Pain in right lower leg: Secondary | ICD-10-CM

## 2018-08-26 DIAGNOSIS — I10 Essential (primary) hypertension: Secondary | ICD-10-CM | POA: Diagnosis not present

## 2018-08-26 DIAGNOSIS — Z1231 Encounter for screening mammogram for malignant neoplasm of breast: Secondary | ICD-10-CM

## 2018-08-26 DIAGNOSIS — J3489 Other specified disorders of nose and nasal sinuses: Secondary | ICD-10-CM | POA: Diagnosis not present

## 2018-08-26 DIAGNOSIS — E785 Hyperlipidemia, unspecified: Secondary | ICD-10-CM

## 2018-08-26 DIAGNOSIS — E2839 Other primary ovarian failure: Secondary | ICD-10-CM

## 2018-08-26 DIAGNOSIS — M21611 Bunion of right foot: Secondary | ICD-10-CM

## 2018-08-26 DIAGNOSIS — L739 Follicular disorder, unspecified: Secondary | ICD-10-CM

## 2018-08-26 DIAGNOSIS — J309 Allergic rhinitis, unspecified: Secondary | ICD-10-CM

## 2018-08-26 DIAGNOSIS — E876 Hypokalemia: Secondary | ICD-10-CM | POA: Diagnosis not present

## 2018-08-26 HISTORY — DX: Bunion of right foot: M21.611

## 2018-08-26 MED ORDER — ROSUVASTATIN CALCIUM 20 MG PO TABS: 20.0000 mg | ORAL_TABLET | Freq: Every day | ORAL | 3 refills | Status: DC

## 2018-08-26 MED ORDER — LEVOCETIRIZINE DIHYDROCHLORIDE 5 MG PO TABS: 5.0000 mg | ORAL_TABLET | Freq: Every evening | ORAL | 3 refills | Status: DC | PRN

## 2018-08-26 MED ORDER — MUPIROCIN 2 % EX OINT: 1.0000 "application " | TOPICAL_OINTMENT | Freq: Two times a day (BID) | CUTANEOUS | 0 refills | Status: DC

## 2018-08-26 NOTE — Progress Notes (Signed)
Chief Complaint  Patient presents with  . Follow-up   F/u with sister  1. HTN controlled on meds  2. C/o inflamed follicle to mid scalp sister popped it but still painful to touch  3. C/o right lower leg pain, chronic knee pain improved s/p steroid injection with [place] [facility] Dorise Hiss for now. No recent trauma but over the last 3 months she has had fall x 1 4. C/o nasal congestion and sores in nose unable to tolerate singular in the past/not effective on Azelastine, flonase and not taking allergy medication as others otc have not helped in the past has not tried Xyzal 5. Thrush to mouth improved 6. DM 2 A1C 6.8 04/15/18 on metformin she needs refill of Crestor  7. Recently had ABIs normal 08/16/18 needs right bunion surgery with podiatry     Review of Systems  Constitutional: Negative for weight loss.  HENT: Negative for hearing loss.   Eyes: Negative for blurred vision.  Respiratory: Negative for shortness of breath.   Cardiovascular: Negative for chest pain.  Gastrointestinal: Negative for abdominal pain.  Musculoskeletal:       Right lower leg pain    Skin: Negative for rash.       Irritated follicle scalp   Neurological: Negative for headaches.  Psychiatric/Behavioral: Negative for depression.   Past Medical History:  Diagnosis Date  . (HFpEF) heart failure with preserved ejection fraction ([place])    a. 06/2016 Echo: EF 50%, no rwma, mild to mod TR.  [name] Allergy   . Anemia   . Arthritis   . Asthma   . Bacterial vaginitis   . Blood in stool   . Brachial neuritis or radiculitis NOS   . Brain tumor (benign) ([place]) 07/2017   near optic nerve. being followed by neurosurgery/eye doctor and pcp. monitoring size.causes sinus problems  . Bronchitis   . Cardiac arrhythmia   . Cervical neck pain with evidence of disc disease    C5/6 disease, MRI done late 2012 - no records available  . Chronic pain   . Cocaine abuse, in remission ([place])    clean x 24 years  . Colon polyps   .  COPD (chronic obstructive pulmonary disease) (HCC)    many inhalers  . Coronary artery disease    a. PCI of LCX 2003; b. PCI of the LAD 2012 with a (2.5 x 8 mm BMS);  c.s/p CABG 4/12:  L-LAD, S-Dx, S-OM, S-RCA (Dr. Prescott Gum);  d. 01/2012 MV: inf infarct, attenuation, no ischemia.  . Depression   . Diabetes mellitus without complication ([place])   . Family history of colon cancer   . Generalized headaches    frequent  . GERD (gastroesophageal reflux disease)   . Headache   . Heart murmur   . Hematochezia   . Hepatitis    history of hepatitis b  . History of cervical cancer    s/p cryotherapy  . History of drug abuse ([place])    cocaine, marijuana, clean since 1989  . History of hepatitis B    from eating undercooked liver  . History of MI (myocardial infarction)   . Hyperlipidemia   . Hypertension   . Myocardial infarction [facility]) 2003, 2012  . PAD (peripheral artery disease) (HCC)    s/p Right SFA atherectomy and PTA 01/15/11, normal ABIs 01/2011  . Polyp of colon   . Seasonal allergies   . Sleep apnea    uses cpap  . Smoking history    quit  07/2010  . Systolic CHF, chronic (HCC)    mild: echo 08/2010 - mildly reduced EF 40-45%, mild diffuse hypokinesis  . Thyroid disease   . Urinary incontinence   . Urine incontinence    Past Surgical History:  Procedure Laterality Date  . ABDOMINAL HYSTERECTOMY     2003  . CHOLECYSTECTOMY  1986  . COLONOSCOPY  2008   3 polyps  . COLONOSCOPY WITH PROPOFOL N/A 02/06/2015   Procedure: COLONOSCOPY WITH PROPOFOL;  Surgeon: Lucilla Lame, MD;  Location: [facility] ENDOSCOPY;  Service: Endoscopy;  Laterality: N/A;  . COLONOSCOPY WITH PROPOFOL N/A 01/27/2017   Procedure: COLONOSCOPY WITH PROPOFOL;  Surgeon: Lucilla Lame, MD;  Location: [facility] ENDOSCOPY;  Service: Endoscopy;  Laterality: N/A;  . CORONARY ANGIOPLASTY     w/ stent placement x2  . CORONARY ARTERY BYPASS GRAFT  2012   Dr [name]  . CORONARY STENT PLACEMENT  2003   S/P MI  . CORONARY STENT  PLACEMENT  2007   [place]  . ESOPHAGOGASTRODUODENOSCOPY (EGD) WITH PROPOFOL N/A 02/06/2015   Procedure: ESOPHAGOGASTRODUODENOSCOPY (EGD) WITH PROPOFOL;  Surgeon: Lucilla Lame, MD;  Location: [facility] ENDOSCOPY;  Service: Endoscopy;  Laterality: N/A;  . ESOPHAGOGASTRODUODENOSCOPY (EGD) WITH PROPOFOL N/A 01/27/2017   Procedure: ESOPHAGOGASTRODUODENOSCOPY (EGD) WITH PROPOFOL;  Surgeon: Lucilla Lame, MD;  Location: [facility] ENDOSCOPY;  Service: Endoscopy;  Laterality: N/A;  . FEMORAL ARTERY STENT  10/2010   right sided (Dr. Burt Knack)  . KNEE ARTHROSCOPY WITH MEDIAL MENISECTOMY Right 08/20/2017   Procedure: KNEE ARTHROSCOPY WITH MEDIAL  AND LATERAL MENISECTOMY;  Surgeon: Hessie Knows, MD;  Location: [facility] ORS;  Service: Orthopedics;  Laterality: Right;  . POSTERIOR CERVICAL LAMINECTOMY N/A 03/08/2018   Procedure: POSTERIOR CERVICAL LAMINECTOMY-C7;  Surgeon: Deetta Perla, MD;  Location: [facility] ORS;  Service: Neurosurgery;  Laterality: N/A;  . TUBAL LIGATION     Family History  Problem Relation Age of Onset  . Hypertension Father   . Heart failure Father   . Diabetes Father   . Colon cancer Father 40  . Glaucoma Father   . Cancer Father        colorectal   . Heart disease Father   . Other Father        glaucoma  . Breast cancer Mother 34       breast cancer, late 39's  . Cancer Mother        breast  . Brain cancer Sister   . Arthritis Sister   . Diabetes Sister   . Hypertension Sister   . Kidney disease Sister   . Diabetes Brother   . Hypertension Brother   . Other Sister        brain tumor   . Coronary artery disease Neg Hx   . Stroke Neg Hx    Social History   Socioeconomic History  . Marital status: Legally Separated    Spouse name: Not on file  . Number of children: Not on file  . Years of education: Not on file  . Highest education level: Not on file  Occupational History  . Not on file  Social Needs  . Financial resource strain: Not on file  . Food insecurity:    Worry: Not on file     Inability: Not on file  . Transportation needs:    Medical: Not on file    Non-medical: Not on file  Tobacco Use  . Smoking status: Former Smoker    Packs/day: 1.00    Years: 38.00    Pack years: 38.00  Types: Cigarettes    Last attempt to quit: 08/12/2010    Years since quitting: 8.0  . Smokeless tobacco: Never Used  Substance and Sexual Activity  . Alcohol use: No  . Drug use: No    Types: Cocaine    Comment: Remote Hx (crack cocaine and marijuana)  . Sexual activity: Yes  Lifestyle  . Physical activity:    Days per week: Not on file    Minutes per session: Not on file  . Stress: Not on file  Relationships  . Social connections:    Talks on phone: Not on file    Gets together: Not on file    Attends religious service: Not on file    Active member of club or organization: Not on file    Attends meetings of clubs or organizations: Not on file    Relationship status: Not on file  . Intimate partner violence:    Fear of current or ex partner: Not on file    Emotionally abused: Not on file    Physically abused: Not on file    Forced sexual activity: Not on file  Other Topics Concern  . 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 albuterol (PROVENTIL HFA;VENTOLIN HFA) 108 (90 Base) MCG/ACT inhaler Inhale 2 puffs into the lungs every 6 (six) hours as needed for wheezing or shortness of breath.  [name] aspirin 81 MG tablet Take 1 tablet (81 mg total) by mouth daily.  . Azelastine HCl 0.15 % SOLN Place 2 sprays into both nostrils daily as needed (allergies).   . baclofen (LIORESAL) 10 MG tablet Take 10 mg by  mouth 3 (three) times daily.  . clotrimazole (MYCELEX) 10 MG troche Take 1 tablet (10 mg total) by mouth 5 (five) times daily.  [name] esomeprazole (NEXIUM) 40 MG capsule Take 40 mg by mouth daily. Reported on 09/04/2015  . ezetimibe (ZETIA) 10 MG tablet TAKE ONE TABLET BY MOUTH ONCE DAILY  . fluconazole (DIFLUCAN) 150 MG tablet Take 1st tablet on day 1, take 2nd tablet on day 4  . fluticasone (FLONASE) 50 MCG/ACT nasal spray Place 2 sprays into both nostrils daily as needed for allergies.   . furosemide (LASIX) 20 MG tablet TAKE ONE TABLET BY MOUTH ONCE DAILY AND ANOTHER DOSE IF NEEDED FOR FLUID BUILD UP  . glucose blood (ONE TOUCH ULTRA TEST) test strip Use as instructed check cbg qd E11.9  . ipratropium-albuterol (DUONEB) 0.5-2.5 (3) MG/3ML SOLN Take 3 mLs by nebulization 2 (two) times daily as needed.  . isosorbide mononitrate (IMDUR) 30 MG 24 hr tablet TAKE 1 TABLET BY MOUTH TWICE DAILY  . Lactobacillus (ACIDOPHILUS PROBIOTIC) 10 MG CAPS Take 1 capsule by mouth daily.  . Lancets MISC 1 Device by Does not apply route daily. Lancets E11.9  . lisinopril (PRINIVIL,ZESTRIL) 5 MG tablet TAKE 1 TABLET BY MOUTH ONCE DAILY  . meclizine (ANTIVERT) 25 MG tablet Take 25 mg by mouth 3 (three) times daily.   . metFORMIN (GLUCOPHAGE-XR) 500 MG 24 hr tablet  Take 1 tablet (500 mg total) by mouth daily with breakfast.  . metoprolol tartrate (LOPRESSOR) 25 MG tablet Take 1 tablet (25 mg total) by mouth 2 (two) times daily.  . nitroGLYCERIN (NITROSTAT) 0.4 MG SL tablet Place 1 tablet (0.4 mg total) under the tongue every 5 (five) minutes as needed. (Patient taking differently: Place 0.4 mg under the tongue every 5 (five) minutes as needed for chest pain. )  . PARoxetine (PAXIL) 10 MG tablet Take 1 tablet (10 mg total) by mouth daily.  . potassium chloride (K-DUR,KLOR-CON) 10 MEQ tablet TAKE 1 TABLET BY MOUTH ONCE DAILY  . rivaroxaban (XARELTO) 2.5 MG TABS tablet Take 1 tablet (2.5 mg total) by mouth 2 (two) times  daily.  . rosuvastatin (CRESTOR) 20 MG tablet Take 20 mg by mouth daily.   [name] senna-docusate (SENOKOT-S) 8.6-50 MG tablet Take 1 tablet by mouth 2 (two) times daily.  [name] umeclidinium-vilanterol (ANORO ELLIPTA) 62.5-25 MCG/INH AEPB Inhale 1 puff into the lungs daily.   Allergies  Allergen Reactions  . Latex Rash       . Shellfish Allergy Anaphylaxis    Hard shellfish/swelling in throat  . Sulfonamide Derivatives Anaphylaxis    Swelling in throat.  . Watermelon [Citrullus Vulgaris] Other (See Comments)    Throat itchy  . Other Swelling   No results found for this or any previous visit (from the past 2160 hour(s)). Objective  Body mass index is 35.08 kg/m. Wt Readings from Last 3 Encounters:  08/26/18 210 lb 12.8 oz (95.6 kg)  07/12/18 212 lb (96.2 kg)  07/08/18 215 lb 6.4 oz (97.7 kg)   Temp Readings from Last 3 Encounters:  08/26/18 98.2 F (36.8 C) (Oral)  07/12/18 98.4 F (36.9 C) (Oral)  07/08/18 98.7 F (37.1 C) (Oral)   BP Readings from Last 3 Encounters:  08/26/18 100/68  07/19/18 107/67  07/12/18 110/78   Pulse Readings from Last 3 Encounters:  08/26/18 82  07/19/18 77  07/12/18 73    Physical Exam Vitals signs and nursing note reviewed.  Constitutional:      Appearance: Normal appearance. She is well-developed and well-groomed. She is obese.  HENT:     Head: Normocephalic and atraumatic.     Nose: Nose normal.     Mouth/Throat:     Mouth: Mucous membranes are moist.     Pharynx: Oropharynx is clear.  Eyes:     Conjunctiva/sclera: Conjunctivae normal.     Pupils: Pupils are equal, round, and reactive to light.  Cardiovascular:     Rate and Rhythm: Normal rate and regular rhythm.     Heart sounds: Normal heart sounds. No murmur.  Pulmonary:     Effort: Pulmonary effort is normal.     Breath sounds: Normal breath sounds.  Skin:    General: Skin is warm and dry.  Neurological:     General: No focal deficit present.     Mental Status: She is alert  and oriented to person, place, and time. Mental status is at baseline.     Gait: Gait normal.  Psychiatric:        Attention and Perception: Attention and perception normal.        Mood and Affect: Mood and affect normal.        Speech: Speech normal.        Behavior: Behavior normal. Behavior is cooperative.        Thought Content: Thought content normal.        Cognition and  Memory: Cognition and memory normal.        Judgment: Judgment normal.     Assessment   1. HTN 2. DM 2 A1C 6.8  3. Folliculitis and nasal sores  4. Allergic rhinitis  5. Right leg pain ? MSK r/o bone etiology  6. Right foot bunion  7. HM Plan  1. Controlled at goal  2. Check fasting labs upcoming  Cont meds  Refilled crestor  3. bactroban to scalp and nose  4. Add xyzal prev could not tolerate singulair  5. Xray right leg  6. F/u podiatry ABI nl 08/16/18 7.  Had flu shotutd -will need Tdap and shingrix in future  pna 23 had 03/10/16  S/p hysterectomy no cervix and f/u OB/GYNh/o abnormal pap s/p cryo  -pap 05/08/15 neg papwill Repeat In 5 years   Colonoscopy Dr. Allen Norris 01/2017 polyps, gastritis   Mammogram had 09/29/17 uni right negdue 3/5/2020h/o benign right breast calcifications  -ordered today   Consider DEXA in future h/o rib fracture ordered   CT chest had 06/12/16 -chronic changes lung bases otherwise normal former smoker  CT chest 06/16/17  IMPRESSION: 1. Cardiomegaly with left main and three-vessel coronary arteriosclerosis. Status post CABG. 2. No acute pulmonary embolus, aortic aneurysm or dissection. 3. Bibasilar atelectasis. Trace bilateral pleural effusions. 4. No acute osseous abnormality.  Aortic Atherosclerosis (ICD10-I70.0).  Had thyroid labs 01/2018 care everywhere normal Provider: Dr. Olivia Mackie [name]-[facility]

## 2018-08-26 NOTE — Patient Instructions (Signed)
Allergies, Adult An allergy is when your body's defense system (immune system) overreacts to an otherwise harmless substance (allergen) that you breathe in or eat or something that touches your skin. When you come into contact with something that you are allergic to, your immune system produces certain proteins (antibodies). These proteins cause cells to release chemicals (histamines) that trigger the symptoms of an allergic reaction. Allergies often affect the nasal passages (allergic rhinitis), eyes (allergic conjunctivitis), skin (atopic dermatitis), and stomach. Allergies can be mild or severe. Allergies cannot spread from person to person (are not contagious). They can develop at any age and may be outgrown. What increases the risk? You may be at greater risk of allergies if other people in your family have allergies. What are the signs or symptoms? Symptoms depend on what type of allergy you have. They may include:  Runny, stuffy nose.  Sneezing.  Itchy mouth, ears, or throat.  Postnasal drip.  Sore throat.  Itchy, red, watery, or puffy eyes.  Skin rash or hives.  Stomach pain.  Vomiting.  Diarrhea.  Bloating.  Wheezing or coughing. People with a severe allergy to food, medicine, or an insect bite may have a life-threatening allergic reaction (anaphylaxis). Symptoms of anaphylaxis include:  Hives.  Itching.  Flushed face.  Swollen lips, tongue, or mouth.  Tight or swollen throat.  Chest pain or tightness in the chest.  Trouble breathing or shortness of breath.  Rapid heartbeat.  Dizziness or fainting.  Vomiting.  Diarrhea.  Pain in the abdomen. How is this diagnosed? This condition is diagnosed based on:  Your symptoms.  Your family and medical history.  A physical exam. You may need to see a health care provider who specializes in treating allergies (allergist). You may also have tests, including:  Skin tests to see which allergens are causing  your symptoms, such as: ? Skin prick test. In this test, your skin is pricked with a tiny needle and exposed to small amounts of possible allergens to see if your skin reacts. ? Intradermal skin test. In this test, a small amount of allergen is injected under your skin to see if your skin reacts. ? Patch test. In this test, a small amount of allergen is placed on your skin and then your skin is covered with a bandage. Your health care provider will check your skin after a couple of days to see if a rash has developed.  Blood tests.  Challenges tests. In this test, you inhale a small amount of allergen by mouth to see if you have an allergic reaction. You may also be asked to:  Keep a food diary. A food diary is a record of all the foods and drinks you have in a day and any symptoms you experience.  Practice an elimination diet. An elimination diet involves eliminating specific foods from your diet and then adding them back in one by one to find out if a certain food causes an allergic reaction. How is this treated? Treatment for allergies depends on your symptoms. Treatment may include:  Cold compresses to soothe itching and swelling.  Eye drops.  Nasal sprays.  Using a saline spray or container (neti pot) to flush out the nose (nasal irrigation). These methods can help clear away mucus and keep the nasal passages moist.  Using a humidifier.  Oral antihistamines or other medicines to block allergic reaction and inflammation.  Skin creams to treat rashes or itching.  Diet changes to eliminate food allergy triggers.  Repeated exposure to tiny amounts of allergens to build up a tolerance and prevent future allergic reactions (immunotherapy). These include: ? Allergy shots. ? Oral treatment. This involves taking small doses of an allergen under the tongue (sublingual immunotherapy).  Emergency epinephrine injection (auto-injector) in case of an allergic emergency. This is a  self-injectable, pre-measured medicine that must be given within the first few minutes of a serious allergic reaction. Follow these instructions at home:         Avoid known allergens whenever possible.  If you suffer from airborne allergens, wash out your nose daily. You can do this with a saline spray or a neti pot to flush out your nose (nasal irrigation).  Take over-the-counter and prescription medicines only as told by your health care provider.  Keep all follow-up visits as told by your health care provider. This is important.  If you are at risk of a severe allergic reaction (anaphylaxis), keep your auto-injector with you at all times.  If you have ever had anaphylaxis, wear a medical alert bracelet or necklace that states you have a severe allergy. Contact a health care provider if:  Your symptoms do not improve with treatment. Get help right away if:  You have symptoms of anaphylaxis, such as: ? Swollen mouth, tongue, or throat. ? Pain or tightness in your chest. ? Trouble breathing or shortness of breath. ? Dizziness or fainting. ? Severe abdominal pain, vomiting, or diarrhea. This information is not intended to replace advice given to you by your health care provider. Make sure you discuss any questions you have with your health care provider. Document Released: 10/07/2002 Document Revised: 11/12/2016 Document Reviewed: 01/30/2016 Elsevier Interactive Patient Education  2019 [employer]  Folliculitis  Folliculitis is inflammation of the hair follicles. Folliculitis most commonly occurs on the scalp, thighs, legs, back, and buttocks. However, it can occur anywhere on the body. What are the causes? This condition may be caused by:  A bacterial infection (common).  A fungal infection.  A viral infection.  Coming into contact with certain chemicals, especially oils and tars.  Shaving or waxing.  Applying greasy ointments or creams to your skin  often. Long-lasting folliculitis and folliculitis that keeps coming back can be caused by bacteria that live in the nostrils. What increases the risk? This condition is more likely to develop in people with:  A weakened immune system.  Diabetes.  Obesity. What are the signs or symptoms? Symptoms of this condition include:  Redness.  Soreness.  Swelling.  Itching.  Small white or yellow, pus-filled, itchy spots (pustules) that appear over a reddened area. If there is an infection that goes deep into the follicle, these may develop into a boil (furuncle).  A group of closely packed boils (carbuncle). These tend to form in hairy, sweaty areas of the body. How is this diagnosed? This condition is diagnosed with a skin exam. To find what is causing the condition, your health care provider may take a sample of one of the pustules or boils for testing. How is this treated? This condition may be treated by:  Applying warm compresses to the affected areas.  Taking an antibiotic medicine or applying an antibiotic medicine to the skin.  Applying or bathing with an antiseptic solution.  Taking an over-the-counter medicine to help with itching.  Having a procedure to drain any pustules or boils. This may be done if a pustule or boil contains a lot of pus or fluid.  Laser hair removal. This  may be done to treat long-lasting folliculitis. Follow these instructions at home:  If directed, apply heat to the affected area as often as told by your health care provider. Use the heat source that your health care provider recommends, such as a moist heat pack or a heating pad. ? Place a towel between your skin and the heat source. ? Leave the heat on for 20-30 minutes. ? Remove the heat if your skin turns bright red. This is especially important if you are unable to feel pain, heat, or cold. You may have a greater risk of getting burned.  If you were prescribed an antibiotic medicine, use it  as told by your health care provider. Do not stop using the antibiotic even if you start to feel better.  Take over-the-counter and prescription medicines only as told by your health care provider.  Do not shave irritated skin.  Keep all follow-up visits as told by your health care provider. This is important. Get help right away if:  You have more redness, swelling, or pain in the affected area.  Red streaks are spreading from the affected area.  You have a fever. This information is not intended to replace advice given to you by your health care provider. Make sure you discuss any questions you have with your health care provider. Document Released: 09/22/2001 Document Revised: 02/01/2016 Document Reviewed: 05/04/2015 Elsevier Interactive Patient Education  2019 [employer].

## 2018-08-26 NOTE — Progress Notes (Signed)
Pre visit review using our clinic review tool, if applicable. No additional management support is needed unless otherwise documented below in the visit note. 

## 2018-08-27 ENCOUNTER — Ambulatory Visit (INDEPENDENT_AMBULATORY_CARE_PROVIDER_SITE_OTHER): Payer: Medicare HMO

## 2018-08-27 ENCOUNTER — Telehealth: Payer: Self-pay | Admitting: *Deleted

## 2018-08-27 DIAGNOSIS — M79661 Pain in right lower leg: Secondary | ICD-10-CM | POA: Diagnosis not present

## 2018-08-27 NOTE — Telephone Encounter (Signed)
Left message informing pt I was calling to give results and will call again Monday.

## 2018-08-27 NOTE — Telephone Encounter (Signed)
The patient last saw Dr. [name] on 10/15/17. She was due to be seen back in 6 months.  Per last office note, the patient was on:  - Lasix 20 mg- take 1 tablet by mouth once daily, may take 1 extra tablet daily as needed for swelling  - Imdur 30 mg- take 1 tablet by mouth twice daily  - Xarelto 2.5 mg - take 1 tablet by mouth once daily (was started on 10/15/17)   The patient is overdue for follow up.  I left a message for the patient to call back to confirm how she is currently taking the above medications.

## 2018-08-27 NOTE — Telephone Encounter (Signed)
Pt called for results.

## 2018-08-27 NOTE — Telephone Encounter (Signed)
-----   Message from Minna Merritts, MD sent at 08/26/2018  9:59 PM EST -----  ----- Message ----- From: [name], [name], MD Sent: 08/26/2018  11:04 AM EST To: Minna Merritts, MD  Pt needs refill of cardiac meds lasix, imdur, and xarelto   Thanks [place]

## 2018-08-30 NOTE — Telephone Encounter (Signed)
Patient returning call.    She is currently taking:  Lasix 20 mg po q d and 1 extra PRN for swelling; imdur 30 mg po q d ; and xarelto 2.5 mg po q d   Patient says she needs refills on these and also :  Nexium 40 mg po q d  zetia 10 mg po q d  Rosuvastatin 20 mg po q d  meclizine 25 mg po TID

## 2018-08-30 NOTE — Telephone Encounter (Addendum)
Left message on pt's home phone to call for results. Left message on listed mobile phone [name], for pt to call for results.

## 2018-08-30 NOTE — Telephone Encounter (Signed)
Left message on pt's home phone to call for results. I spoke with Luanna Salk and she gave pt's mobile [phone].

## 2018-08-30 NOTE — Telephone Encounter (Signed)
Left message to call for results. Pt called for results. I informed pt of the circulation results. Pt states she will make an appt to discuss surgery once she has the results from her other doctor concerning the problem with her lower leg. I suggested pt get that doctor to send a note to Dr. Milinda Pointer.

## 2018-08-31 ENCOUNTER — Encounter: Payer: Self-pay | Admitting: Internal Medicine

## 2018-09-01 MED ORDER — LISINOPRIL 5 MG PO TABS: 5.0000 mg | ORAL_TABLET | Freq: Every day | ORAL | 0 refills | Status: DC

## 2018-09-01 MED ORDER — POTASSIUM CHLORIDE CRYS ER 10 MEQ PO TBCR: 10.0000 meq | EXTENDED_RELEASE_TABLET | Freq: Every day | ORAL | 2 refills | Status: DC

## 2018-09-01 MED ORDER — RIVAROXABAN 2.5 MG PO TABS: 2.5000 mg | ORAL_TABLET | Freq: Two times a day (BID) | ORAL | 0 refills | Status: DC

## 2018-09-01 MED ORDER — FUROSEMIDE 20 MG PO TABS: ORAL_TABLET | ORAL | 0 refills | Status: DC

## 2018-09-01 MED ORDER — ISOSORBIDE MONONITRATE ER 30 MG PO TB24: 30.0000 mg | ORAL_TABLET | Freq: Two times a day (BID) | ORAL | 0 refills | Status: DC

## 2018-09-01 MED ORDER — EZETIMIBE 10 MG PO TABS: 10.0000 mg | ORAL_TABLET | Freq: Every day | ORAL | 3 refills | Status: DC

## 2018-09-01 NOTE — Telephone Encounter (Signed)
Patient calling back. She is due for follow up appointment. Advised I could refill cardiac meds for enough to get her to an appointment. She was agreeable and scheduled ot see Dr [name] on 09/16/18. She was appreciative. Refills sent.

## 2018-09-01 NOTE — Telephone Encounter (Signed)
No answer. Left message to call back.   

## 2018-09-03 DIAGNOSIS — R69 Illness, unspecified: Secondary | ICD-10-CM | POA: Diagnosis not present

## 2018-09-05 ENCOUNTER — Other Ambulatory Visit: Payer: Self-pay

## 2018-09-05 ENCOUNTER — Emergency Department: Payer: Medicare HMO

## 2018-09-05 ENCOUNTER — Emergency Department
Admission: EM | Admit: 2018-09-05 | Discharge: 2018-09-05 | Disposition: A | Discharge status: Home / Self Care | Payer: Medicare HMO | Attending: Emergency Medicine | Admitting: Emergency Medicine

## 2018-09-05 DIAGNOSIS — I2510 Atherosclerotic heart disease of native coronary artery without angina pectoris: Secondary | ICD-10-CM | POA: Diagnosis not present

## 2018-09-05 DIAGNOSIS — Z7982 Long term (current) use of aspirin: Secondary | ICD-10-CM | POA: Diagnosis not present

## 2018-09-05 DIAGNOSIS — Z7984 Long term (current) use of oral hypoglycemic drugs: Secondary | ICD-10-CM | POA: Insufficient documentation

## 2018-09-05 DIAGNOSIS — I5022 Chronic systolic (congestive) heart failure: Secondary | ICD-10-CM | POA: Insufficient documentation

## 2018-09-05 DIAGNOSIS — J449 Chronic obstructive pulmonary disease, unspecified: Secondary | ICD-10-CM | POA: Diagnosis not present

## 2018-09-05 DIAGNOSIS — Z9049 Acquired absence of other specified parts of digestive tract: Secondary | ICD-10-CM | POA: Diagnosis not present

## 2018-09-05 DIAGNOSIS — Z7901 Long term (current) use of anticoagulants: Secondary | ICD-10-CM | POA: Insufficient documentation

## 2018-09-05 DIAGNOSIS — Z91040 Latex allergy status: Secondary | ICD-10-CM | POA: Diagnosis not present

## 2018-09-05 DIAGNOSIS — Z79899 Other long term (current) drug therapy: Secondary | ICD-10-CM | POA: Diagnosis not present

## 2018-09-05 DIAGNOSIS — J45909 Unspecified asthma, uncomplicated: Secondary | ICD-10-CM | POA: Insufficient documentation

## 2018-09-05 DIAGNOSIS — R05 Cough: Secondary | ICD-10-CM | POA: Diagnosis present

## 2018-09-05 DIAGNOSIS — D3502 Benign neoplasm of left adrenal gland: Secondary | ICD-10-CM | POA: Diagnosis not present

## 2018-09-05 DIAGNOSIS — I252 Old myocardial infarction: Secondary | ICD-10-CM | POA: Insufficient documentation

## 2018-09-05 DIAGNOSIS — K5791 Diverticulosis of intestine, part unspecified, without perforation or abscess with bleeding: Secondary | ICD-10-CM | POA: Insufficient documentation

## 2018-09-05 DIAGNOSIS — F329 Major depressive disorder, single episode, unspecified: Secondary | ICD-10-CM | POA: Insufficient documentation

## 2018-09-05 DIAGNOSIS — Z951 Presence of aortocoronary bypass graft: Secondary | ICD-10-CM | POA: Insufficient documentation

## 2018-09-05 DIAGNOSIS — K5790 Diverticulosis of intestine, part unspecified, without perforation or abscess without bleeding: Secondary | ICD-10-CM

## 2018-09-05 DIAGNOSIS — F1410 Cocaine abuse, uncomplicated: Secondary | ICD-10-CM | POA: Insufficient documentation

## 2018-09-05 DIAGNOSIS — I110 Hypertensive heart disease with heart failure: Secondary | ICD-10-CM | POA: Insufficient documentation

## 2018-09-05 DIAGNOSIS — Z87891 Personal history of nicotine dependence: Secondary | ICD-10-CM | POA: Insufficient documentation

## 2018-09-05 DIAGNOSIS — R69 Illness, unspecified: Secondary | ICD-10-CM | POA: Diagnosis not present

## 2018-09-05 DIAGNOSIS — E119 Type 2 diabetes mellitus without complications: Secondary | ICD-10-CM | POA: Diagnosis not present

## 2018-09-05 DIAGNOSIS — J069 Acute upper respiratory infection, unspecified: Secondary | ICD-10-CM | POA: Diagnosis not present

## 2018-09-05 DIAGNOSIS — B9789 Other viral agents as the cause of diseases classified elsewhere: Secondary | ICD-10-CM | POA: Diagnosis not present

## 2018-09-05 LAB — BASIC METABOLIC PANEL
Anion gap: 7 (ref 5–15)
BUN: 14 mg/dL (ref 6–20)
CO2: 26 mmol/L (ref 22–32)
Calcium: 9.4 mg/dL (ref 8.9–10.3)
Chloride: 106 mmol/L (ref 98–111)
Creatinine, Ser: 0.98 mg/dL (ref 0.44–1.00)
GFR calc non Af Amer: >60 mL/min (ref >60)
Glucose, Bld: 102 mg/dL — ABNORMAL HIGH (ref 70–99)
Potassium: 3.9 mmol/L (ref 3.5–5.1)
Sodium: 139 mmol/L (ref 135–145)

## 2018-09-05 LAB — URINALYSIS, COMPLETE (UACMP) WITH MICROSCOPIC
BILIRUBIN URINE: NEGATIVE
Bacteria, UA: NONE SEEN
Glucose, UA: NEGATIVE mg/dL
Ketones, ur: NEGATIVE mg/dL
Nitrite: NEGATIVE
Protein, ur: NEGATIVE mg/dL
Specific Gravity, Urine: 1.009 (ref 1.005–1.030)
pH: 5 (ref 5.0–8.0)

## 2018-09-05 LAB — CBC WITH DIFFERENTIAL/PLATELET
Abs Immature Granulocytes: 0.02 10*3/uL (ref 0.00–0.07)
BASOS ABS: 0 10*3/uL (ref 0.0–0.1)
Basophils Relative: 0 %
Eosinophils Absolute: 0.2 10*3/uL (ref 0.0–0.5)
Eosinophils Relative: 2 %
HCT: 38.2 % (ref 36.0–46.0)
Hemoglobin: 12.5 g/dL (ref 12.0–15.0)
Immature Granulocytes: 0 %
Lymphocytes Relative: 42 %
Lymphs Abs: 3.6 10*3/uL (ref 0.7–4.0)
MCH: 30.3 pg (ref 26.0–34.0)
MCHC: 32.7 g/dL (ref 30.0–36.0)
MCV: 92.7 fL (ref 80.0–100.0)
Monocytes Absolute: 0.7 10*3/uL (ref 0.1–1.0)
Monocytes Relative: 9 %
NRBC: 0 % (ref 0.0–0.2)
Neutro Abs: 3.9 10*3/uL (ref 1.7–7.7)
Neutrophils Relative %: 47 %
Platelets: 170 10*3/uL (ref 150–400)
RBC: 4.12 MIL/uL (ref 3.87–5.11)
RDW: 14.1 % (ref 11.5–15.5)
WBC: 8.5 10*3/uL (ref 4.0–10.5)

## 2018-09-05 MED ORDER — IOHEXOL 300 MG/ML  SOLN
100.0000 mL | Freq: Once | INTRAMUSCULAR | Status: AC | PRN
  Administered 2018-09-05: 100 mL via INTRAVENOUS
  Filled 2018-09-05: qty 100

## 2018-09-05 MED ORDER — IOPAMIDOL (ISOVUE-300) INJECTION 61%
30.0000 mL | Freq: Once | INTRAVENOUS | Status: AC | PRN
  Administered 2018-09-05: 30 mL via ORAL
  Filled 2018-09-05: qty 30

## 2018-09-05 MED ORDER — KETOROLAC TROMETHAMINE 30 MG/ML IJ SOLN
30.0000 mg | Freq: Once | INTRAMUSCULAR | Status: AC
  Administered 2018-09-05: 30 mg via INTRAVENOUS
  Filled 2018-09-05: qty 1

## 2018-09-05 MED ORDER — BACLOFEN 10 MG PO TABS: 10.0000 mg | ORAL_TABLET | Freq: Three times a day (TID) | ORAL | 0 refills | Status: AC

## 2018-09-05 NOTE — Discharge Instructions (Addendum)
You exam, labs, and CT scan are negative. 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 There is no intervention, until and unless you develop some inflammation. Take the Baclofen for muscle spasms as needed. Follow-up with your provider for ongoing symptoms. Return as needed.

## 2018-09-05 NOTE — ED Triage Notes (Signed)
Pt c/o left lower groin pain with pain when walking Cough, chills, runny nose, body aches

## 2018-09-05 NOTE — ED Notes (Addendum)
See triage note  Presents with cough,runny nose and sneezing which started about 1 week ago  Also states she developed some left groin pain yesterday  Unsure if she turned wrong  Pain increases with walking   Denies any urinary sxs'  Ambulates with slight limp d/t pain

## 2018-09-05 NOTE — ED Provider Notes (Signed)
[facility] Emergency Department Provider Note ____________________________________________  Time seen: 1105  I have reviewed the triage vital signs and the nursing notes.  HISTORY  Chief Complaint  Influenza and Abdominal Pain  HPI [name] is a 58 y.o. female a history of heart failure, acute MI, peripheral arterial disease, presents to the ED accompanied by her grandson, for evaluation of some URI symptoms and cough.  Patient also has a secondary complaint of some groin discomfort on the left.  She reports symptoms were worsened after she cleaned in office yesterday.  She denies any outright fall, slip, trip.  She describes what she thinks is a groin strain with pain to the left lower abdomen and pelvic region.  Pain is aggravated by flexing the knee towards the chest, and walking.  She denies any fevers, chills, sweats patient also denies any productive cough, nausea, vomiting, diarrhea.  She reports runny nose, body aches, and groin pain with walking.  She gives a remote history of precancerous colon polyps removed with routine colonoscopy, but did not not endorse a history of diverticular disease.  Past Medical History:  Diagnosis Date  . (HFpEF) heart failure with preserved ejection fraction ([place])    a. 06/2016 Echo: EF 50%, no rwma, mild to mod TR.  [name] Allergy   . Anemia   . Arthritis   . Asthma   . Bacterial vaginitis   . Blood in stool   . Brachial neuritis or radiculitis NOS   . 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 being followed by neurosurgery/eye doctor and pcp. monitoring size.causes sinus problems  . Bronchitis   . Cardiac arrhythmia   . Cervical neck pain with evidence of disc disease    C5/6 disease, MRI done late 2012 - no records available  . Chronic pain   . Cocaine abuse, in remission ([place])    clean x 24 years  . Colon polyps   . COPD (chronic obstructive pulmonary disease) (HCC)    many inhalers  . Coronary  artery disease    a. PCI of LCX 2003; b. PCI of the LAD 2012 with a (2.5 x 8 mm BMS);  c.s/p CABG 4/12:  L-LAD, S-Dx, S-OM, S-RCA (Dr. Prescott Gum);  d. 01/2012 MV: inf infarct, attenuation, no ischemia.  . Depression   . Diabetes mellitus without complication ([place])   . Family history of colon cancer   . Generalized headaches    frequent  . GERD (gastroesophageal reflux disease)   . Headache   . Heart murmur   . Hematochezia   . Hepatitis    history of hepatitis b  . History of cervical cancer    s/p cryotherapy  . History of drug abuse ([place])    cocaine, marijuana, clean since 1989  . History of hepatitis B    from eating undercooked liver  . History of MI (myocardial infarction)   . Hyperlipidemia   . Hypertension   . Myocardial infarction [facility]) 2003, 2012  . PAD (peripheral artery disease) (HCC)    s/p Right SFA atherectomy and PTA 01/15/11, normal ABIs 01/2011  . Polyp of colon   . Seasonal allergies   . Sleep apnea    uses cpap  . Smoking history    quit 07/2010  . Systolic CHF, chronic (HCC)    mild: echo 08/2010 - mildly reduced EF 40-45%, mild diffuse hypokinesis  . Thyroid disease   . Urinary incontinence   . Urine incontinence  Patient Active Problem List   Diagnosis Date Noted  . Allergic rhinitis 08/26/2018  . Bunion of right foot 08/26/2018  . Fall 05/14/2018  . Anxiety and depression 04/05/2018  . Numbness and tingling of right arm 02/24/2018  . DM2 (diabetes mellitus, type 2) ([place]) 12/18/2017  . Urinary incontinence 12/18/2017  . Lumbar radiculopathy 12/18/2017  . 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 Abnormal feces   . Benign neoplasm of ascending colon   . Intractable vomiting with nausea   . Acute esophagogastric ulcer   . Gastritis without bleeding   . Vasomotor flushing 11/24/2016  . Morbid obesity ([place]) 10/29/2015  . Back ache 06/19/2015  . Colon polyp 06/19/2015  . CCF (congestive cardiac failure) ([place]) 06/19/2015  . Family history of  colon cancer 06/19/2015  . Orthostatic hypotension 04/30/2015  . Hematochezia 01/11/2015  . History of colonic polyps 01/11/2015  . Atypical chest pain 01/20/2014  . Diarrhea 06/03/2013  . Bronchitis 01/03/2013  . Right knee pain 12/20/2011  . HTN (hypertension) 12/20/2011  . Systolic CHF, chronic ([place])   . History of cervical cancer   . Depression   . GERD (gastroesophageal reflux disease)   . Seasonal allergies   . History of drug abuse ([place])   . PAD (peripheral artery disease) ([place]) 02/03/2011  . 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 Smoking history 12/16/2010  . Hyperlipidemia 08/23/2010  . Coronary atherosclerosis of native coronary artery 08/23/2010  . CLAUDICATION 08/23/2010  . CAROTID BRUIT 08/23/2010  . SHORTNESS OF BREATH 08/23/2010    Past Surgical History:  Procedure Laterality Date  . ABDOMINAL HYSTERECTOMY     2003  . CHOLECYSTECTOMY  1986  . COLONOSCOPY  2008   3 polyps  . COLONOSCOPY WITH PROPOFOL N/A 02/06/2015   Procedure: COLONOSCOPY WITH PROPOFOL;  Surgeon: Lucilla Lame, MD;  Location: [facility] ENDOSCOPY;  Service: Endoscopy;  Laterality: N/A;  . COLONOSCOPY WITH PROPOFOL N/A 01/27/2017   Procedure: COLONOSCOPY WITH PROPOFOL;  Surgeon: Lucilla Lame, MD;  Location: [facility] ENDOSCOPY;  Service: Endoscopy;  Laterality: N/A;  . CORONARY ANGIOPLASTY     w/ stent placement x2  . CORONARY ARTERY BYPASS GRAFT  2012   Dr [name]  . CORONARY STENT PLACEMENT  2003   S/P MI  . CORONARY STENT PLACEMENT  2007   [place]  . ESOPHAGOGASTRODUODENOSCOPY (EGD) WITH PROPOFOL N/A 02/06/2015   Procedure: ESOPHAGOGASTRODUODENOSCOPY (EGD) WITH PROPOFOL;  Surgeon: Lucilla Lame, MD;  Location: [facility] ENDOSCOPY;  Service: Endoscopy;  Laterality: N/A;  . ESOPHAGOGASTRODUODENOSCOPY (EGD) WITH PROPOFOL N/A 01/27/2017   Procedure: ESOPHAGOGASTRODUODENOSCOPY (EGD) WITH PROPOFOL;  Surgeon: Lucilla Lame, MD;  Location: [facility] ENDOSCOPY;  Service: Endoscopy;  Laterality: N/A;  . FEMORAL ARTERY STENT  10/2010   right  sided (Dr. Burt Knack)  . KNEE ARTHROSCOPY WITH MEDIAL MENISECTOMY Right 08/20/2017   Procedure: KNEE ARTHROSCOPY WITH MEDIAL  AND LATERAL MENISECTOMY;  Surgeon: Hessie Knows, MD;  Location: [facility] ORS;  Service: Orthopedics;  Laterality: Right;  . POSTERIOR CERVICAL LAMINECTOMY N/A 03/08/2018   Procedure: POSTERIOR CERVICAL LAMINECTOMY-C7;  Surgeon: Deetta Perla, MD;  Location: [facility] ORS;  Service: Neurosurgery;  Laterality: N/A;  . TUBAL LIGATION      Prior to Admission medications   Medication Sig Start Date End Date Taking? Authorizing Provider  albuterol (PROVENTIL HFA;VENTOLIN HFA) 108 (90 Base) MCG/ACT inhaler Inhale 2 puffs into the lungs every 6 (six) hours as needed for wheezing or shortness of breath. 04/10/16   Wilhelmina Mcardle, MD  aspirin 81 MG tablet Take 1 tablet (  81 mg total) by mouth daily. 12/09/16   Minna Merritts, MD  Azelastine HCl 0.15 % SOLN Place 2 sprays into both nostrils daily as needed (allergies).  10/31/16   [provider]  baclofen (LIORESAL) 10 MG tablet Take 1 tablet (10 mg total) by mouth 3 (three) times daily for 10 days. 09/05/18 09/15/18  Marsalis Beaulieu, Dannielle Karvonen, PA-C  clotrimazole (MYCELEX) 10 MG troche Take 1 tablet (10 mg total) by mouth 5 (five) times daily. 07/08/18   [name], [name], MD  esomeprazole (NEXIUM) 40 MG capsule Take 40 mg by mouth daily. Reported on 09/04/2015 05/31/15   [provider]  ezetimibe (ZETIA) 10 MG tablet Take 1 tablet (10 mg total) by mouth daily. 09/01/18   Minna Merritts, MD  fluconazole (DIFLUCAN) 150 MG tablet Take 1st tablet on day 1, take 2nd tablet on day 4 07/12/18   Guse, Jacquelynn Cree, FNP  fluticasone (FLONASE) 50 MCG/ACT nasal spray Place 2 sprays into both nostrils daily as needed for allergies.     [provider]  furosemide (LASIX) 20 MG tablet TAKE ONE TABLET BY MOUTH ONCE DAILY AND ANOTHER DOSE IF NEEDED FOR FLUID BUILD UP 09/01/18   Minna Merritts, MD  glucose blood (ONE TOUCH ULTRA TEST)  test strip Use as instructed check cbg qd E11.9 04/05/18   [name], [name], MD  ipratropium-albuterol (DUONEB) 0.5-2.5 (3) MG/3ML SOLN Take 3 mLs by nebulization 2 (two) times daily as needed. 07/09/18   [name], [name], MD  isosorbide mononitrate (IMDUR) 30 MG 24 hr tablet Take 1 tablet (30 mg total) by mouth 2 (two) times daily. 09/01/18   Minna Merritts, MD  Lactobacillus (ACIDOPHILUS PROBIOTIC) 10 MG CAPS Take 1 capsule by mouth daily. 07/12/18   Jodelle Green, FNP  Lancets MISC 1 Device by Does not apply route daily. Lancets E11.9 04/05/18   [name], [name], MD  levocetirizine (XYZAL) 5 MG tablet Take 1 tablet (5 mg total) by mouth at bedtime as needed for allergies. 08/26/18   [name], [name], MD  lisinopril (PRINIVIL,ZESTRIL) 5 MG tablet Take 1 tablet (5 mg total) by mouth daily. 09/01/18   Minna Merritts, MD  meclizine (ANTIVERT) 25 MG tablet Take 25 mg by mouth 3 (three) times daily.  01/05/18   [provider]  metFORMIN (GLUCOPHAGE-XR) 500 MG 24 hr tablet Take 1 tablet (500 mg total) by mouth daily with breakfast. 04/16/18   [name], [name], MD  metoprolol tartrate (LOPRESSOR) 25 MG tablet Take 1 tablet (25 mg total) by mouth 2 (two) times daily. 05/03/18   [name], [name], MD  mupirocin ointment (BACTROBAN) 2 % Apply 1 application topically 2 (two) times daily. Prn scalp x 1 week and nose x 5 days 08/26/18   [name], [name], MD  nitroGLYCERIN (NITROSTAT) 0.4 MG SL tablet Place 1 tablet (0.4 mg total) under the tongue every 5 (five) minutes as needed. Patient taking differently: Place 0.4 mg under the tongue every 5 (five) minutes as needed for chest pain.  07/10/17   Minna Merritts, MD  PARoxetine (PAXIL) 10 MG tablet Take 1 tablet (10 mg total) by mouth daily. 04/05/18   [name], [name], MD  potassium chloride (K-DUR,KLOR-CON) 10 MEQ tablet Take 1 tablet (10 mEq total) by mouth daily. 09/01/18   Minna Merritts, MD   rivaroxaban (XARELTO) 2.5 MG TABS tablet Take 1 tablet (2.5 mg total) by mouth 2 (two) times daily. 09/01/18   [name],  Kathlene November, MD  rosuvastatin (CRESTOR) 20 MG tablet Take 1 tablet (20 mg total) by mouth at bedtime. 08/26/18   [name], [name], MD  senna-docusate (SENOKOT-S) 8.6-50 MG tablet Take 1 tablet by mouth 2 (two) times daily. 03/09/18   Deetta Perla, MD  umeclidinium-vilanterol [facility] ELLIPTA) 62.5-25 MCG/INH AEPB Inhale 1 puff into the lungs daily. 05/30/16   Wilhelmina Mcardle, MD    Allergies Latex; Shellfish allergy; Sulfonamide derivatives; Watermelon [citrullus vulgaris]; and Other  Family History  Problem Relation Age of Onset  . Hypertension Father   . Heart failure Father   . Diabetes Father   . Colon cancer Father 14  . Glaucoma Father   . Cancer Father        colorectal   . Heart disease Father   . Other Father        glaucoma  . Breast cancer Mother 3       breast cancer, late 29's  . Cancer Mother        breast  . Brain cancer Sister   . Arthritis Sister   . Diabetes Sister   . Hypertension Sister   . Kidney disease Sister   . Diabetes Brother   . Hypertension Brother   . Other Sister        brain tumor   . Coronary artery disease Neg Hx   . Stroke Neg Hx     Social History Social History   Tobacco Use  . Smoking status: Former Smoker    Packs/day: 1.00    Years: 38.00    Pack years: 38.00    Types: Cigarettes    Last attempt to quit: 08/12/2010    Years since quitting: 8.0  . Smokeless tobacco: Never Used  Substance Use Topics  . Alcohol use: No  . Drug use: No    Types: Cocaine    Comment: Remote Hx (crack cocaine and marijuana)    Review of Systems  Constitutional: Negative for fever.  Reports intermittent chills. Eyes: Negative for visual changes. ENT: Negative for sore throat.  Reports runny nose and congestion. Cardiovascular: Negative for chest pain. Respiratory: Negative for shortness of breath.  Reports nonproductive  cough. Gastrointestinal: Negative for abdominal pain, vomiting and diarrhea. Genitourinary: Negative for dysuria. Musculoskeletal: Negative for back pain.  Left groin pain. Skin: Negative for rash. Neurological: Negative for headaches, focal weakness or numbness. ____________________________________________  PHYSICAL EXAM:  VITAL SIGNS: ED Triage Vitals  Enc Vitals Group     BP 09/05/18 0938 121/78     Pulse Rate 09/05/18 0938 77     Resp 09/05/18 0938 16     Temp 09/05/18 0938 98.1 F (36.7 C)     Temp Source 09/05/18 0938 Oral     SpO2 09/05/18 0938 97 %     Weight 09/05/18 0939 210 lb (95.3 kg)     Height 09/05/18 0939 5\' 5"  (1.651 m)     Head Circumference --      Peak Flow --      Pain Score 09/05/18 0941 10     Pain Loc --      Pain Edu? --      Excl. in [place]? --     Constitutional: Alert and oriented. Well appearing and in no distress. Head: Normocephalic and atraumatic. Eyes: Conjunctivae are normal. Normal extraocular movements Ears: Canals clear. TMs intact bilaterally. Nose: No congestion/rhinorrhea/epistaxis. Mouth/Throat: Mucous membranes are moist.  Uvula is midline and tonsils are flat.  No oropharyngeal lesions  appreciated. Cardiovascular: Normal rate, regular rhythm. Normal distal pulses. Respiratory: Normal respiratory effort. No wheezes/rales/rhonchi. Gastrointestinal: Soft and nontender. No distention.  Normal bowel sounds noted.  Patient is tender to palpation to the left greater than right lower abdominopelvic region.  Musculoskeletal: Normal spinal alignment without midline tenderness, spasm, deformity, or step-off.  Patient with some increased left lower pelvic pain with flexion of the left knee.  She has normal exam the lower extremity without joint effusion or internal derangement to the knees.  Nontender with normal range of motion in all extremities.  Neurologic: Mildly antalgic gait without ataxia. Normal speech and language. No gross focal  neurologic deficits are appreciated. Skin:  Skin is warm, dry and intact. No rash noted. ____________________________________________   LABS (pertinent positives/negatives) Labs Reviewed  URINALYSIS, COMPLETE (UACMP) WITH MICROSCOPIC - Abnormal; Notable for the following components:      Result Value   Color, Urine YELLOW (*)    APPearance CLEAR (*)    Hgb urine dipstick LARGE (*)    Leukocytes, UA SMALL (*)    All other components within normal limits  BASIC METABOLIC PANEL - Abnormal; Notable for the following components:   Glucose, Bld 102 (*)    All other components within normal limits  CBC WITH DIFFERENTIAL/PLATELET  ____________________________________________   RADIOLOGY  CT ABD/Pelvis w/ CM IMPRESSION: 1. No acute findings are noted in the abdomen or pelvis to account for the patient's symptoms. 2. Mild colonic diverticulosis without evidence of acute diverticulitis at this time. 3. Aortic atherosclerosis. 4. Small left adrenal adenoma is stable. 5. Additional incidental findings, as above. ____________________________________________  PROCEDURES  Procedures Toradol 30 mg IVP ____________________________________________  INITIAL IMPRESSION / ASSESSMENT AND PLAN / ED COURSE  Differential diagnosis includes, but is not limited to, ovarian cyst, ovarian torsion, acute appendicitis, diverticulitis, urinary tract infection/pyelonephritis, endometriosis, bowel obstruction, colitis, renal colic, gastroenteritis, hernia, fibroids, endometriosis, pregnancy related pain including ectopic pregnancy, etc.  Patient with ED evaluation of left groin pain that was concerning based on exam for left lower quadrant pain of the abdomen and pelvis.  Patient with history of precancerous colon polyps, had a concerning clinical picture for diverticular disease.  Her labs are reassuring at this time and her CT of the abdomen and pelvis with contrast not revealed any acute findings, or any  specific cause for her left lower quadrant/groin pain.  Patient reports improvement of her symptoms after administration of IV Toradol.  She will be discharged to follow with primary provider for symptoms of left groin strain.  She will be given a prescription for baclofen which she has reported has worked in the past for pain.  Return precautions have been reviewed. ____________________________________________  FINAL CLINICAL IMPRESSION(S) / ED DIAGNOSES  Final diagnoses:  Viral upper respiratory tract infection  Diverticulosis      Carmie End, Dannielle Karvonen, PA-C 09/05/18 1835    Earleen Newport, MD 09/06/18 1058

## 2018-09-06 DIAGNOSIS — R69 Illness, unspecified: Secondary | ICD-10-CM | POA: Diagnosis not present

## 2018-09-07 DIAGNOSIS — R69 Illness, unspecified: Secondary | ICD-10-CM | POA: Diagnosis not present

## 2018-09-14 ENCOUNTER — Encounter: Payer: Self-pay | Admitting: Internal Medicine

## 2018-09-14 ENCOUNTER — Ambulatory Visit (INDEPENDENT_AMBULATORY_CARE_PROVIDER_SITE_OTHER): Payer: Medicare HMO | Admitting: Internal Medicine

## 2018-09-14 VITALS — BP 130/72 | HR 63 | Temp 98.5°F | Ht 65.0 in | Wt 209.0 lb

## 2018-09-14 DIAGNOSIS — M5136 Other intervertebral disc degeneration, lumbar region: Secondary | ICD-10-CM | POA: Diagnosis not present

## 2018-09-14 DIAGNOSIS — M5440 Lumbago with sciatica, unspecified side: Secondary | ICD-10-CM

## 2018-09-14 DIAGNOSIS — M25561 Pain in right knee: Secondary | ICD-10-CM | POA: Diagnosis not present

## 2018-09-14 DIAGNOSIS — R490 Dysphonia: Secondary | ICD-10-CM

## 2018-09-14 DIAGNOSIS — R05 Cough: Secondary | ICD-10-CM | POA: Diagnosis not present

## 2018-09-14 DIAGNOSIS — J069 Acute upper respiratory infection, unspecified: Secondary | ICD-10-CM | POA: Diagnosis not present

## 2018-09-14 DIAGNOSIS — M5416 Radiculopathy, lumbar region: Secondary | ICD-10-CM | POA: Diagnosis not present

## 2018-09-14 DIAGNOSIS — R059 Cough, unspecified: Secondary | ICD-10-CM

## 2018-09-14 DIAGNOSIS — M25551 Pain in right hip: Secondary | ICD-10-CM

## 2018-09-14 DIAGNOSIS — G8929 Other chronic pain: Secondary | ICD-10-CM | POA: Diagnosis not present

## 2018-09-14 DIAGNOSIS — J449 Chronic obstructive pulmonary disease, unspecified: Secondary | ICD-10-CM

## 2018-09-14 DIAGNOSIS — M25552 Pain in left hip: Secondary | ICD-10-CM

## 2018-09-14 MED ORDER — DOXYCYCLINE HYCLATE 100 MG PO TABS: 100.0000 mg | ORAL_TABLET | Freq: Two times a day (BID) | ORAL | 0 refills | Status: DC

## 2018-09-14 MED ORDER — METHYLPREDNISOLONE ACETATE 40 MG/ML IJ SUSP
40.0000 mg | Freq: Once | INTRAMUSCULAR | Status: AC
  Administered 2018-09-14: 40 mg via INTRAMUSCULAR

## 2018-09-14 MED ORDER — HYDROCOD POLST-CPM POLST ER 10-8 MG/5ML PO SUER: 5.0000 mL | Freq: Every evening | ORAL | 0 refills | Status: DC | PRN

## 2018-09-14 NOTE — Progress Notes (Signed)
Pre visit review using our clinic review tool, if applicable. No additional management support is needed unless otherwise documented below in the visit note. 

## 2018-09-14 NOTE — Patient Instructions (Addendum)
I will order MRI both hip and low back  Try Tussionex at night for cough and Mucinex DM green label or Robitussin DM during the day  Warm tea with honey and lemon  Voice rest   Try Miralax, metamucil or benefiber for constipation   Diverticulosis  Diverticulosis is a condition that develops when small pouches (diverticula) form in the wall of the large intestine (colon). The colon is where water is absorbed and stool is formed. The pouches form when the inside layer of the colon pushes through weak spots in the outer layers of the colon. You may have a few pouches or many of them. What are the causes? The cause of this condition is not known. What increases the risk? The following factors may make you more likely to develop this condition:  Being older than age 29. Your risk for this condition increases with age. Diverticulosis is rare among people younger than age 58. By age 85, many people have it.  Eating a low-fiber diet.  Having frequent constipation.  Being overweight.  Not getting enough exercise.  Smoking.  Taking over-the-counter pain medicines, like aspirin and ibuprofen.  Having a family history of diverticulosis. What are the signs or symptoms? In most people, there are no symptoms of this condition. If you do have symptoms, they may include:  Bloating.  Cramps in the abdomen.  Constipation or diarrhea.  Pain in the lower left side of the abdomen. How is this diagnosed? This condition is most often diagnosed during an exam for other colon problems. Because diverticulosis usually has no symptoms, it often cannot be diagnosed independently. This condition may be diagnosed by:  Using a flexible scope to examine the colon (colonoscopy).  Taking an X-ray of the colon after dye has been put into the colon (barium enema).  Doing a CT scan. How is this treated? You may not need treatment for this condition if you have never developed an infection related to  diverticulosis. If you have had an infection before, treatment may include:  Eating a high-fiber diet. This may include eating more fruits, vegetables, and grains.  Taking a fiber supplement.  Taking a live bacteria supplement (probiotic).  Taking medicine to relax your colon.  Taking antibiotic medicines. Follow these instructions at home:  Drink 6-8 glasses of water or more each day to prevent constipation.  Try not to strain when you have a bowel movement.  If you have had an infection before: ? Eat more fiber as directed by your health care provider or your diet and nutrition specialist (dietitian). ? Take a fiber supplement or probiotic, if your health care provider approves.  Take over-the-counter and prescription medicines only as told by your health care provider.  If you were prescribed an antibiotic, take it as told by your health care provider. Do not stop taking the antibiotic even if you start to feel better.  Keep all follow-up visits as told by your health care provider. This is important. Contact a health care provider if:  You have pain in your abdomen.  You have bloating.  You have cramps.  You have not had a bowel movement in 3 days. Get help right away if:  Your pain gets worse.  Your bloating becomes very bad.  You have a fever or chills, and your symptoms suddenly get worse.  You vomit.  You have bowel movements that are bloody or black.  You have bleeding from your rectum. Summary  Diverticulosis is a condition that  develops when small pouches (diverticula) form in the wall of the large intestine (colon).  You may have a few pouches or many of them.  This condition is most often diagnosed during an exam for other colon problems.  If you have had an infection related to diverticulosis, treatment may include increasing the fiber in your diet, taking supplements, or taking medicines. This information is not intended to replace advice given  to you by your health care provider. Make sure you discuss any questions you have with your health care provider. Document Released: 04/10/2004 Document Revised: 06/02/2016 Document Reviewed: 06/02/2016 Elsevier Interactive Patient Education  2019 [employer]   Hoarseness  Hoarseness, also called dysphonia, is any abnormal change in your voice that can make it difficult to speak. Your voice may sound raspy, breathy, or strained. Hoarseness is caused by a problem with your vocal cords (vocal folds). These are two bands of tissue inside your voice box (larynx). When you speak, your vocal cords move back and forth to create sound. The surfaces of your vocal cords need to be smooth for your voice to sound clear. Swelling or lumps on your vocal cords can cause hoarseness. Common causes of vocal cord problems include:  Infection in the nose, throat, and upper air passages (upper respiratory infection).  A long-term cough.  Straining or overusing your voice.  Smoking, or exposure to secondhand smoke.  Allergies.  Medication side effects.  Vocal cord growths.  Vocal cord injuries.  Stomach acids that move up in your throat and irritate your vocal cords (gastroesophageal reflux).  Diseases that affect the nervous system, such as a stroke or Parkinson's disease. Follow these instructions at home: Watch your condition for any changes. To ease discomfort and protect your vocal cords:  Rest your voice.  Do not whisper. Whispering can cause muscle strain.  Do not speak in a loud or harsh voice.  Avoid coughing or clearing your throat.  Do not use any products that contain nicotine or tobacco, such as cigarettes and e-cigarettes. If you need help quitting, ask your health care provider.  Avoid secondhand smoke.  Do not eat foods that give you heartburn, such as spicy or acidic foods like hot peppers and orange juice. Heartburn can make gastroesophageal reflux worse.  Do not drink  beverages that contain caffeine (coffee, tea, or soft drinks) or alcohol (beer, wine, or liquor).  Drink enough fluid to keep your urine pale yellow.  Use a humidifier if the air in your home is dry. If recommended by your health care provider, schedule an appointment with a speech-language specialist. This specialist may give you methods to try that can help you avoid misusing your voice. Contact a health care provider if:  You have hoarseness that lasts longer than 3 weeks.  You almost lose or completely lose your voice for more than 3 days.  You have pain when you swallow or try to talk.  You feel a lump in your neck. Get help right away if:  You have trouble swallowing.  You feel like you are choking when you swallow.  You cough up blood or vomit blood.  You have trouble breathing.  You choke, cannot swallow, or cannot breathe if you lie flat.  You notice swelling or a rash on your body, face, or tongue. Summary  Hoarseness, also called dysphonia, is any abnormal change in your voice that can make it difficult to speak. Your voice may sound raspy, breathy, or strained.  Hoarseness is caused  by a problem with your vocal cords (vocal folds).  Do not speak in a loud or harsh voice, use nicotine or tobacco products, or eat foods that give you heartburn.  If recommended by your health care provider, meet with a speech-language specialist. This information is not intended to replace advice given to you by your health care provider. Make sure you discuss any questions you have with your health care provider. Document Released: 06/27/2005 Document Revised: 04/10/2017 Document Reviewed: 04/10/2017 Elsevier Interactive Patient Education  2019 [employer]  Tendinitis  Tendinitis is inflammation of a tendon. A tendon is a strong cord of tissue that connects muscle to bone. Tendinitis can affect any tendon, but it most commonly affects the:  Shoulder tendon (rotator  cuff).  Ankle tendon (Achilles tendon).  Elbow tendon (triceps tendon).  Tendons in the wrist. What are the causes? This condition may be caused by:  Overusing a tendon or muscle. This is common.  Age-related wear and tear.  Injury.  Inflammatory conditions, such as arthritis.  Certain medicines. What increases the risk? You are more likely to develop this condition if you do activities that involve the same movements over and over again (repetitive motions). What are the signs or symptoms? Symptoms of this condition may include:  Pain.  Tenderness.  Mild swelling.  Decreased range of motion. How is this diagnosed? This condition is diagnosed with a physical exam. You may also have tests, such as:  Ultrasound. This uses sound waves to make an image of the inside of your body in the affected area.  MRI. How is this treated? This condition may be treated by resting, icing, applying pressure (compression), and raising (elevating) the affected area above the level of your heart. This is known as RICE therapy. Treatment may also include:  Medicines to help reduce inflammation or to help reduce pain.  Exercises or physical therapy to strengthen and stretch the tendon.  A brace or splint.  Surgery. This is rarely needed. Follow these instructions at home: If you have a splint or brace:  Wear the splint or brace as told by your health care provider. Remove it only as told by your health care provider.  Loosen the splint or brace if your fingers or toes tingle, become numb, or turn cold and blue.  Keep the splint or brace clean.  If the splint or brace is not waterproof: ? Do not let it get wet. ? Cover it with a watertight covering when you take a bath or shower. Managing pain, stiffness, and swelling  If directed, put ice on the affected area. ? If you have a removable splint or brace, remove it as told by your health care provider. ? Put ice in a plastic  bag. ? Place a towel between your skin and the bag. ? Leave the ice on for 20 minutes, 2-3 times a day.  Move the fingers or toes of the affected limb often, if this applies. This can help to prevent stiffness and lessen swelling.  If directed, raise (elevate) the affected area above the level of your heart while you are sitting or lying down.  If directed, apply heat to the affected area before you exercise. Use the heat source that your health care provider recommends, such as a moist heat pack or a heating pad.     ? Place a towel between your skin and the heat source. ? Leave the heat on for 20-30 minutes. ? Remove the heat if your skin  turns bright red. This is especially important if you are unable to feel pain, heat, or cold. You may have a greater risk of getting burned. Driving  Do not drive or use heavy machinery while taking prescription pain medicine.  Ask your health care provider when it is safe to drive if you have a splint or brace on any part of your arm or leg. Activity  Rest the affected area as told by your health care provider.  Return to your normal activities as told by your health care provider. Ask your health care provider what activities are safe for you.  Avoid using the affected area while you are experiencing symptoms of tendinitis.  Do exercises as told by your health care provider. General instructions  If you have a splint, do not put pressure on any part of the splint until it is fully hardened. This may take several hours.  Wear an elastic bandage or compression wrap only as told by your health care provider.  Take over-the-counter and prescription medicines only as told by your health care provider.  Keep all follow-up visits as told by your health care provider. This is important. Contact a health care provider if:  Your symptoms do not improve.  You develop new, unexplained problems, such as numbness in your hands. Summary  Tendinitis  is inflammation of a tendon.  You are more likely to develop this condition if you do activities that involve the same movements over and over again.  This condition may be treated by resting, icing, applying pressure (compression), and elevating the area above the level of your heart. This is known as RICE therapy.  Avoid using the affected area while you are experiencing symptoms of tendinitis. This information is not intended to replace advice given to you by your health care provider. Make sure you discuss any questions you have with your health care provider. Document Released: 07/11/2000 Document Revised: 12/02/2017 Document Reviewed: 12/02/2017 Elsevier Interactive Patient Education  2019 [employer].

## 2018-09-15 ENCOUNTER — Encounter: Payer: Self-pay | Admitting: Internal Medicine

## 2018-09-15 NOTE — Progress Notes (Signed)
Chief Complaint  Patient presents with  . Follow-up   F/u  1. C/o hoarse voice still due to still coughing x 2 weeks and MSK pain from coughing she has stayed at home trying not to get sick and denies sick contacts. She is coughing phlegm white to yellow. She has not tried to use duoneb but has anoro and prn Albuterol. ? H/o COPD pulmonary has her on Anoro which she uses daily. She has been on lisinopril long term and has been taking nexium and GERD appears controlled   2. C/o chronic low back pain, neck pain s/p neck surgery with Dr. Remo Lipps cook and b/l hip pain. Tried OTC pain meds  MRI lumbar 11/01/15  IMPRESSION: No finding to explain the patient's right lower extremity symptoms. Negative for discitis or osteomyelitis.  Synovial cyst off the medial margin left facet at L5-S1 slightly deflects the left S1 root without compressing or displacing it.  Mild to moderate right foraminal narrowing L1-2 due to disc.   MRI 08/29/16  FINDINGS: Medial compartment: (There is abnormal linear signal within the medial meniscus near junction of posterior horn and mid body which abuts the tibial undersurface consistent with nondisplaced tear (5:8-10). The articular cartilage, and subchondral bone are normal. 3 mm bone island (7:14).  Lateral compartment: There is abnormal globular and a vertical linear signal extending from the anterior root attachment of the lateral meniscus into the anterior horn consistent with degenerative change and nondisplaced tear. There is lateral extrusion of the mid body of the lateral meniscus possibly related to sprain or incompetence of the anterior intrameniscal ligament or degenerative change and tearing involving anterior root attachment and anterior horn compromising peripheral loop strength. The articular cartilage and subchondral bone are normal.  Patellofemoral compartment: The articular cartilage and subchondral bone are normal.  The ligaments of the knee are  intact.The extensor mechanism is normal.The medial and lateral patellar retinacula are normal.  Small knee effusion. A small medial popliteal cyst is seen effusion location between medial head of gastrocnemius and semimembranosus tendon . There are no loose intra-articular bodies.   MRI 08/29/16  TECHNIQUE: MRI of thepelvis and both hips large field of view and multisequence small field-of-view images of the right hip was performed using a body and possibly local coil.Multisequence, multiplanar imaging was performed.No contrast administration was performed.  FINDINGS: Large field of view images demonstrate normal bone marrow signal about the pelvis, hip joints and sacroiliac joints as well as visualized segments of the lower lumbar spine. There is mild disc space narrowing and desiccation noted at the 2 lowest lumbar disc segments which are incompletely visualized but suggest mild degenerative disc disease. There is patchy increased T2 signal at the parasymphyseal bilateral pubic bones. No hip joint effusions are appreciated.  There is subtle hyperintense T2 signal in the soft tissues immediately lateral to the right greater trochanter. This abuts the inserting gluteus medius tendon at the right hip and may reflect mild tendinosis or tendinitis without significant bursal fluid accumulation. Similar features are noted at the left hip. Otherwise, peripelvic and hip joint soft tissues appear normal.  Small field-of-view images at the right hand again demonstrate mild increased signal at the inserting gluteus medius tendon at the lateral margin of the greater trochanter consistent with mild tendinosis or insertional tendinitis. The acetabular labrum is grossly intact without definite indication of tear. There is mild patchy intermediate signal within the anterior superior labrum could represent early degenerative change but no definite evidence of tear. Hamstring  origins and partially visualized  sciatic nerve courses about the level of the hip joints are intact. There is minimal patchy T2 signal at the, origin of both right and left hamstrings at the ischia which may represent minimal insertional enthesophyte pathic change but no definite tearing or disruption. Abductor muscle origins at the anterior pelvis are intact  Review of Systems  Constitutional: Negative for fever.  HENT: Negative for hearing loss.        +Hoarse voice   Eyes: Negative for blurred vision.  Respiratory: Positive for cough and sputum production.   Cardiovascular: Negative for chest pain.  Gastrointestinal: Negative for abdominal pain.  Musculoskeletal: Negative for falls.  Skin: Negative for rash.  Neurological: Negative for headaches.  Psychiatric/Behavioral: Negative for depression.   Past Medical History:  Diagnosis Date  . (HFpEF) heart failure with preserved ejection fraction ([place])    a. 06/2016 Echo: EF 50%, no rwma, mild to mod TR.  [name] Allergy   . Anemia   . Arthritis   . Asthma   . Bacterial vaginitis   . Blood in stool   . Brachial neuritis or radiculitis NOS   . 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 being followed by neurosurgery/eye doctor and pcp. monitoring size.causes sinus problems  . Bronchitis   . Cardiac arrhythmia   . Cervical neck pain with evidence of disc disease    C5/6 disease, MRI done late 2012 - no records available  . Chronic pain   . Cocaine abuse, in remission ([place])    clean x 24 years  . Colon polyps   . COPD (chronic obstructive pulmonary disease) (HCC)    many inhalers  . Coronary artery disease    a. PCI of LCX 2003; b. PCI of the LAD 2012 with a (2.5 x 8 mm BMS);  c.s/p CABG 4/12:  L-LAD, S-Dx, S-OM, S-RCA (Dr. Prescott Gum);  d. 01/2012 MV: inf infarct, attenuation, no ischemia.  . Depression   . Diabetes mellitus without complication ([place])   . Family history of colon cancer   . Generalized headaches    frequent  . GERD (gastroesophageal  reflux disease)   . Headache   . Heart murmur   . Hematochezia   . Hepatitis    history of hepatitis b  . History of cervical cancer    s/p cryotherapy  . History of drug abuse ([place])    cocaine, marijuana, clean since 1989  . History of hepatitis B    from eating undercooked liver  . History of MI (myocardial infarction)   . Hyperlipidemia   . Hypertension   . Myocardial infarction [facility]) 2003, 2012  . PAD (peripheral artery disease) (HCC)    s/p Right SFA atherectomy and PTA 01/15/11, normal ABIs 01/2011  . Polyp of colon   . Seasonal allergies   . Sleep apnea    uses cpap  . Smoking history    quit 07/2010  . Systolic CHF, chronic (HCC)    mild: echo 08/2010 - mildly reduced EF 40-45%, mild diffuse hypokinesis  . Thyroid disease   . Urinary incontinence   . Urine incontinence    Past Surgical History:  Procedure Laterality Date  . ABDOMINAL HYSTERECTOMY     2003  . CHOLECYSTECTOMY  1986  . COLONOSCOPY  2008   3 polyps  . COLONOSCOPY WITH PROPOFOL N/A 02/06/2015   Procedure: COLONOSCOPY WITH PROPOFOL;  Surgeon: Lucilla Lame, MD;  Location: [facility] ENDOSCOPY;  Service: Endoscopy;  Laterality:  N/A;  . COLONOSCOPY WITH PROPOFOL N/A 01/27/2017   Procedure: COLONOSCOPY WITH PROPOFOL;  Surgeon: Lucilla Lame, MD;  Location: [facility] ENDOSCOPY;  Service: Endoscopy;  Laterality: N/A;  . CORONARY ANGIOPLASTY     w/ stent placement x2  . CORONARY ARTERY BYPASS GRAFT  2012   Dr [name]  . CORONARY STENT PLACEMENT  2003   S/P MI  . CORONARY STENT PLACEMENT  2007   [place]  . ESOPHAGOGASTRODUODENOSCOPY (EGD) WITH PROPOFOL N/A 02/06/2015   Procedure: ESOPHAGOGASTRODUODENOSCOPY (EGD) WITH PROPOFOL;  Surgeon: Lucilla Lame, MD;  Location: [facility] ENDOSCOPY;  Service: Endoscopy;  Laterality: N/A;  . ESOPHAGOGASTRODUODENOSCOPY (EGD) WITH PROPOFOL N/A 01/27/2017   Procedure: ESOPHAGOGASTRODUODENOSCOPY (EGD) WITH PROPOFOL;  Surgeon: Lucilla Lame, MD;  Location: [facility] ENDOSCOPY;  Service: Endoscopy;  Laterality:  N/A;  . FEMORAL ARTERY STENT  10/2010   right sided (Dr. Burt Knack)  . KNEE ARTHROSCOPY WITH MEDIAL MENISECTOMY Right 08/20/2017   Procedure: KNEE ARTHROSCOPY WITH MEDIAL  AND LATERAL MENISECTOMY;  Surgeon: Hessie Knows, MD;  Location: [facility] ORS;  Service: Orthopedics;  Laterality: Right;  . POSTERIOR CERVICAL LAMINECTOMY N/A 03/08/2018   Procedure: POSTERIOR CERVICAL LAMINECTOMY-C7;  Surgeon: Deetta Perla, MD;  Location: [facility] ORS;  Service: Neurosurgery;  Laterality: N/A;  . TUBAL LIGATION     Family History  Problem Relation Age of Onset  . Hypertension Father   . Heart failure Father   . Diabetes Father   . Colon cancer Father 85  . Glaucoma Father   . Cancer Father        colorectal   . Heart disease Father   . Other Father        glaucoma  . Breast cancer Mother 77       breast cancer, late 77's  . Cancer Mother        breast  . Brain cancer Sister   . Arthritis Sister   . Diabetes Sister   . Hypertension Sister   . Kidney disease Sister   . Diabetes Brother   . Hypertension Brother   . Other Sister        brain tumor   . Coronary artery disease Neg Hx   . Stroke Neg Hx    Social History   Socioeconomic History  . Marital status: Legally Separated    Spouse name: Not on file  . Number of children: Not on file  . Years of education: Not on file  . Highest education level: Not on file  Occupational History  . Not on file  Social Needs  . Financial resource strain: Not on file  . Food insecurity:    Worry: Not on file    Inability: Not on file  . Transportation needs:    Medical: Not on file    Non-medical: Not on file  Tobacco Use  . Smoking status: Former Smoker    Packs/day: 1.00    Years: 38.00    Pack years: 38.00    Types: Cigarettes    Last attempt to quit: 08/12/2010    Years since quitting: 8.0  . Smokeless tobacco: Never Used  Substance and Sexual Activity  . Alcohol use: No  . Drug use: No    Types: Cocaine    Comment: Remote Hx (crack cocaine  and marijuana)  . Sexual activity: Yes  Lifestyle  . Physical activity:    Days per week: Not on file    Minutes per session: Not on file  . Stress: Not on file  Relationships  .  Social connections:    Talks on phone: Not on file    Gets together: Not on file    Attends religious service: Not on file    Active member of club or organization: Not on file    Attends meetings of clubs or organizations: Not on file    Relationship status: Not on file  . Intimate partner violence:    Fear of current or ex partner: Not on file    Emotionally abused: Not on file    Physically abused: Not on file    Forced sexual activity: Not on file  Other Topics Concern  . 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   Allergies  Allergen Reactions  . Latex Rash       . Shellfish Allergy Anaphylaxis    Hard shellfish/swelling in throat  . Sulfonamide Derivatives Anaphylaxis    Swelling in throat.  . Watermelon [Citrullus Vulgaris] Other (See Comments)    Throat itchy  . Other Swelling   Recent Results (from the past 2160 hour(s))  Urinalysis, Complete w Microscopic     Status: Abnormal   Collection Time: 09/05/18 11:13 AM  Result Value Ref Range   Color, Urine YELLOW (A) YELLOW   APPearance CLEAR (A) CLEAR   Specific Gravity, Urine 1.009 1.005 - 1.030   pH 5.0 5.0 - 8.0   Glucose, UA NEGATIVE NEGATIVE mg/dL   Hgb urine dipstick LARGE (A) NEGATIVE   Bilirubin Urine NEGATIVE NEGATIVE   Ketones, ur NEGATIVE NEGATIVE mg/dL    Protein, ur NEGATIVE NEGATIVE mg/dL   Nitrite NEGATIVE NEGATIVE   Leukocytes, UA SMALL (A) NEGATIVE   RBC / HPF 0-5 0 - 5 RBC/hpf   WBC, UA 0-5 0 - 5 WBC/hpf   Bacteria, UA NONE SEEN NONE SEEN   Squamous Epithelial / LPF 0-5 0 - 5   Mucus PRESENT     Comment: Performed at [facility], [address] [place], [place] [address]  CBC with Differential     Status: None   Collection Time: 09/05/18 12:25 PM  Result Value Ref Range   WBC 8.5 4.0 - 10.5 K/uL   RBC 4.12 3.87 - 5.11 MIL/uL   Hemoglobin 12.5 12.0 - 15.0 g/dL   HCT 38.2 36.0 - 46.0 %   MCV 92.7 80.0 - 100.0 fL   MCH 30.3 26.0 - 34.0 pg   MCHC 32.7 30.0 - 36.0 g/dL   RDW 14.1 11.5 - 15.5 %   Platelets 170 150 - 400 K/uL   nRBC 0.0 0.0 - 0.2 %   Neutrophils Relative % 47 %   Neutro Abs 3.9 1.7 - 7.7 K/uL   Lymphocytes Relative 42 %   Lymphs Abs 3.6 0.7 - 4.0 K/uL   Monocytes Relative 9 %   Monocytes Absolute 0.7 0.1 - 1.0 K/uL   Eosinophils Relative 2 %   Eosinophils Absolute 0.2 0.0 - 0.5 K/uL   Basophils Relative  0 %   Basophils Absolute 0.0 0.0 - 0.1 K/uL   Immature Granulocytes 0 %   Abs Immature Granulocytes 0.02 0.00 - 0.07 K/uL    Comment: Performed at [facility], [address] [place], [place] [address]  Basic metabolic panel     Status: Abnormal   Collection Time: 09/05/18 12:25 PM  Result Value Ref Range   Sodium 139 135 - 145 mmol/L   Potassium 3.9 3.5 - 5.1 mmol/L   Chloride 106 98 - 111 mmol/L   CO2 26 22 - 32 mmol/L   Glucose, Bld 102 (H) 70 - 99 mg/dL   BUN 14 6 - 20 mg/dL   Creatinine, Ser 0.98 0.44 - 1.00 mg/dL   Calcium 9.4 8.9 - 10.3 mg/dL   GFR calc non Af Amer >60 >60 mL/min   GFR calc Af Amer >60 >60 mL/min   Anion gap 7 5 - 15    Comment: Performed at [facility], [address] [place], [place] [address]   Objective  Body mass index is 34.78 kg/m. Wt Readings from Last 3 Encounters:  09/14/18 209 lb (94.8 kg)  09/05/18 210 lb (95.3 kg)  08/26/18  210 lb 12.8 oz (95.6 kg)   Temp Readings from Last 3 Encounters:  09/14/18 98.5 F (36.9 C) (Oral)  09/05/18 98.1 F (36.7 C) (Oral)  08/26/18 98.2 F (36.8 C) (Oral)   BP Readings from Last 3 Encounters:  09/14/18 130/72  09/05/18 115/76  08/26/18 100/68   Pulse Readings from Last 3 Encounters:  09/14/18 63  09/05/18 66  08/26/18 82    Physical Exam Vitals signs and nursing note reviewed.  Constitutional:      Appearance: Normal appearance. She is well-developed and well-groomed. She is obese.  HENT:     Head: Normocephalic and atraumatic.     Nose: Nose normal.     Mouth/Throat:     Mouth: Mucous membranes are moist.     Pharynx: Oropharynx is clear.  Eyes:     Conjunctiva/sclera: Conjunctivae normal.     Pupils: Pupils are equal, round, and reactive to light.  Cardiovascular:     Rate and Rhythm: Normal rate and regular rhythm.     Heart sounds: Normal heart sounds.  Pulmonary:     Effort: Pulmonary effort is normal.     Breath sounds: Normal breath sounds.  Skin:    General: Skin is warm and dry.  Neurological:     General: No focal deficit present.     Mental Status: She is alert and oriented to person, place, and time. Mental status is at baseline.     Gait: Gait normal.  Psychiatric:        Attention and Perception: Attention and perception normal.        Mood and Affect: Mood and affect normal.        Speech: Speech normal.        Behavior: Behavior normal. Behavior is cooperative.        Thought Content: Thought content normal.        Cognition and Memory: Cognition and memory normal.        Judgment: Judgment normal.     Assessment   1. Cough with ? H/o COPD, also with h/o allergies, former smoker h/o GERD ? Med related ACEI 2. Hoarseness likely 2/2 cough 3.  B/l hip pain  -? Tendonitis vs arthritis r/o bursitis vs referred pain from low back h/o abnormal MRI lumbar  Chronic back pain   Chronic neck  Pain s/p surgery Dr. Remo Lipps cook    Chronic knee pain  4. HM Plan   1. depomedrol 40 mg x 1  Given sample trelegy hold anoro for now  Cont prn albuterol  Trial of doxycycline   Consider d/c ACEI lisinopril could be causing cough will CC Dr. [name] for other options he rec. ARB vs other   GERD seems controlled per pt on nexium  2. tussionex sx control  Consider ENT if hoarseness continues  3. MRI lumbar and b/l hips  F/u with ortho Dorise Hiss and NS Dr. Deetta Perla  4.  Had flu shotutd -will need Tdap and shingrix in future  pna 23 had 03/10/16  S/p hysterectomy no cervix and f/u OB/GYNh/o abnormal pap s/p cryo  -pap 05/08/15 neg papwill Repeat In 5 years   Colonoscopy Dr. Allen Norris 01/2017 polyps, gastritis, diverticulosis   Mammogram had 09/29/17 uni right negdue 3/5/2020h/o benign right breast calcifications  -ordered   Consider DEXA in future h/o rib fractureordered and sch  CT chest had 06/12/16 -chronic changes lung bases otherwise normal former smoker CT chest 06/16/17 IMPRESSION: 1. Cardiomegaly with left main and three-vessel coronary arteriosclerosis. Status post CABG. 2. No acute pulmonary embolus, aortic aneurysm or dissection. 3. Bibasilar atelectasis. Trace bilateral pleural effusions. 4. No acute osseous abnormality.  Aortic Atherosclerosis (ICD10-I70.0).  Had thyroid labs 01/2018 care everywhere normal Provider: Dr. Olivia Mackie [name]-Internal Medicine

## 2018-09-15 NOTE — Progress Notes (Signed)
Cardiology Office Note  Date:  09/16/2018   ID:  [name], DOB June 21, 1961, MRN [id number]  PCP:  [name], [name], MD   Chief Complaint  Patient presents with  . other    12 month follow up. Meds reviewed by the pt. verbally. Pt. c/o chest pain, mid-sternum pain and shortness of breath. Pt. is on antibiotics for upper respiratory infection.      HPI:  58 yo woman with a  long smoking history,   CAD s/p  NSTEMI at [facility]  1/12, with BMS to pLAD (80% stenosis),  residual severe stenosis of the RCA/nondominant small vessel,  continued chest pain,  cardiac catheterization showing distal left main stenosis involving a high grade ostial LAD stenosis and circumflex disease approximately 90%, 90% diagonal disease, 80% nondominant RCA disease with ejection fraction 45%,  CABG x 4 in 2012 with a LIMA to the LAD, vein graft to the diagonal, vein graft to the marginal and vein graft to the RCA,   peripheral vascular disease,  S/p atherectomy and PTA of her right SFA on 01/15/11,   outpatient stress test  July 2013 , OSA who presents for routine followup of her coronary artery disease  In follow-up today she reports having chronic cough, wonders if it could be from the lisinopril Lisinopril previously added based off previous echocardiogram results showing Ejection fraction low 40 to 45% in September 2018  Denies any significant shortness of breath or chest pain on exertion concerning for angina  She does have chronic low back pain Pain also in her upper back, lower legs Previous emergency room visits reviewed with her In the emergency room September 23, 2017 with low back pain radiating to left leg, Placed on steroid taper  No orthostasis symptoms  Lab work reviewed with her in detail Total chol 154 LDL 81 She reports compliance with her Zetia and Crestor  Stress test 04/21/2017: Artifact, grossly no large regions of ischemia  On lasix every other day and as needed for  SOB Denies any leg swelling, PND orthopnea No exercise program Trying to watch her diet  EKG personally reviewed by myself on todays visit Shows normal sinus rhythm with rate 63 bpm T-wave abnormality precordial leads, no change from prior EKG  Other past medical history Prior  outpatient stress test  July 2013 showed significant breast attenuation artifact, inferior wall artifact from GI uptake. Overall no significant ischemia. History of chronic left arm and upper left chest  pain and was seen pain clinic. Prior diagnosis of carpal tunnel syndrome  occasional left breast pain radiating to her mediastinum.    Previously seen by ear nose throat for vestibular testing as a cause of her dizziness.  EGD and colonoscopy which by her report showed hiatal hernia. Prior problems with swallowing  She has had MRI of the neck showing cervical disc disease  Lab work shows total cholesterol 151, LDL 79, HDL 52, hemoglobin A1c 6.1  Echocardiogram in the hospital showed normal LV systolic function ejection fraction 50-55%, mild MR, mild to moderate TR with right ventricular systolic pressures 78-29  PMH:   has a past medical history of (HFpEF) heart failure with preserved ejection fraction ([place]), Allergy, Anemia, Arthritis, Asthma, Bacterial vaginitis, Blood in stool, Brachial neuritis or radiculitis NOS, Brain tumor (benign) ([place]) (07/2017), Bronchitis, Cardiac arrhythmia, Cervical neck pain with evidence of disc disease, Chronic pain, Cocaine abuse, in remission ([place]), Colon polyps, COPD (chronic obstructive pulmonary disease) ([place]), Coronary artery disease, Depression, Diabetes mellitus without complication (  [place]), Family history of colon cancer, Generalized headaches, GERD (gastroesophageal reflux disease), Headache, Heart murmur, Hematochezia, Hepatitis, History of cervical cancer, History of drug abuse ([place]), History of hepatitis B, History of MI (myocardial infarction), Hyperlipidemia, Hypertension,  Myocardial infarction ([place]) (2003, 2012), PAD (peripheral artery disease) ([place]), Polyp of colon, Seasonal allergies, Sleep apnea, Smoking history, Systolic CHF, chronic ([place]), Thyroid disease, Urinary incontinence, and Urine incontinence.  PSH:    Past Surgical History:  Procedure Laterality Date  . ABDOMINAL HYSTERECTOMY     2003  . CHOLECYSTECTOMY  1986  . COLONOSCOPY  2008   3 polyps  . COLONOSCOPY WITH PROPOFOL N/A 02/06/2015   Procedure: COLONOSCOPY WITH PROPOFOL;  Surgeon: Lucilla Lame, MD;  Location: [facility] ENDOSCOPY;  Service: Endoscopy;  Laterality: N/A;  . COLONOSCOPY WITH PROPOFOL N/A 01/27/2017   Procedure: COLONOSCOPY WITH PROPOFOL;  Surgeon: Lucilla Lame, MD;  Location: [facility] ENDOSCOPY;  Service: Endoscopy;  Laterality: N/A;  . CORONARY ANGIOPLASTY     w/ stent placement x2  . CORONARY ARTERY BYPASS GRAFT  2012   Dr [name]  . CORONARY STENT PLACEMENT  2003   S/P MI  . CORONARY STENT PLACEMENT  2007   [place]  . ESOPHAGOGASTRODUODENOSCOPY (EGD) WITH PROPOFOL N/A 02/06/2015   Procedure: ESOPHAGOGASTRODUODENOSCOPY (EGD) WITH PROPOFOL;  Surgeon: Lucilla Lame, MD;  Location: [facility] ENDOSCOPY;  Service: Endoscopy;  Laterality: N/A;  . ESOPHAGOGASTRODUODENOSCOPY (EGD) WITH PROPOFOL N/A 01/27/2017   Procedure: ESOPHAGOGASTRODUODENOSCOPY (EGD) WITH PROPOFOL;  Surgeon: Lucilla Lame, MD;  Location: [facility] ENDOSCOPY;  Service: Endoscopy;  Laterality: N/A;  . FEMORAL ARTERY STENT  10/2010   right sided (Dr. Burt Knack)  . KNEE ARTHROSCOPY WITH MEDIAL MENISECTOMY Right 08/20/2017   Procedure: KNEE ARTHROSCOPY WITH MEDIAL  AND LATERAL MENISECTOMY;  Surgeon: Hessie Knows, MD;  Location: [facility] ORS;  Service: Orthopedics;  Laterality: Right;  . POSTERIOR CERVICAL LAMINECTOMY N/A 03/08/2018   Procedure: POSTERIOR CERVICAL LAMINECTOMY-C7;  Surgeon: Deetta Perla, MD;  Location: [facility] ORS;  Service: Neurosurgery;  Laterality: N/A;  . TUBAL LIGATION      Current Outpatient Medications  Medication Sig Dispense  Refill  . albuterol (PROVENTIL HFA;VENTOLIN HFA) 108 (90 Base) MCG/ACT inhaler Inhale 2 puffs into the lungs every 6 (six) hours as needed for wheezing or shortness of breath. 1 Inhaler 3  . aspirin 81 MG tablet Take 1 tablet (81 mg total) by mouth daily. 90 tablet 3  . Azelastine HCl 0.15 % SOLN Place 2 sprays into both nostrils daily as needed (allergies).     . chlorpheniramine-HYDROcodone (TUSSIONEX PENNKINETIC ER) 10-8 MG/5ML SUER Take 5 mLs by mouth at bedtime as needed for cough. 140 mL 0  . clotrimazole (MYCELEX) 10 MG troche Take 1 tablet (10 mg total) by mouth 5 (five) times daily. 35 tablet 0  . doxycycline (VIBRA-TABS) 100 MG tablet Take 1 tablet (100 mg total) by mouth 2 (two) times daily. 14 tablet 0  . esomeprazole (NEXIUM) 40 MG capsule Take 40 mg by mouth daily. Reported on 09/04/2015    . ezetimibe (ZETIA) 10 MG tablet Take 1 tablet (10 mg total) by mouth daily. 90 tablet 3  . fluconazole (DIFLUCAN) 150 MG tablet Take 1st tablet on day 1, take 2nd tablet on day 4 2 tablet 0  . fluticasone (FLONASE) 50 MCG/ACT nasal spray Place 2 sprays into both nostrils daily as needed for allergies.     . furosemide (LASIX) 20 MG tablet TAKE ONE TABLET BY MOUTH ONCE DAILY AND ANOTHER DOSE IF NEEDED FOR FLUID BUILD UP 30  tablet 0  . glucose blood (ONE TOUCH ULTRA TEST) test strip Use as instructed check cbg qd E11.9 100 each 12  . ipratropium-albuterol (DUONEB) 0.5-2.5 (3) MG/3ML SOLN Take 3 mLs by nebulization 2 (two) times daily as needed. 360 mL 12  . isosorbide mononitrate (IMDUR) 30 MG 24 hr tablet Take 1 tablet (30 mg total) by mouth 2 (two) times daily. 60 tablet 0  . Lactobacillus (ACIDOPHILUS PROBIOTIC) 10 MG CAPS Take 1 capsule by mouth daily. 30 capsule 2  . Lancets MISC 1 Device by Does not apply route daily. Lancets E11.9 90 each 3  . levocetirizine (XYZAL) 5 MG tablet Take 1 tablet (5 mg total) by mouth at bedtime as needed for allergies. 90 tablet 3  . meclizine (ANTIVERT) 25 MG  tablet Take 25 mg by mouth 3 (three) times daily.   3  . metFORMIN (GLUCOPHAGE-XR) 500 MG 24 hr tablet Take 1 tablet (500 mg total) by mouth daily with breakfast. 90 tablet 3  . metoprolol tartrate (LOPRESSOR) 25 MG tablet Take 1 tablet (25 mg total) by mouth 2 (two) times daily. 180 tablet 3  . mupirocin ointment (BACTROBAN) 2 % Apply 1 application topically 2 (two) times daily. Prn scalp x 1 week and nose x 5 days 30 g 0  . nitroGLYCERIN (NITROSTAT) 0.4 MG SL tablet Place 1 tablet (0.4 mg total) under the tongue every 5 (five) minutes as needed. (Patient taking differently: Place 0.4 mg under the tongue every 5 (five) minutes as needed for chest pain. ) 25 tablet 6  . PARoxetine (PAXIL) 10 MG tablet Take 1 tablet (10 mg total) by mouth daily. 30 tablet 11  . potassium chloride (K-DUR,KLOR-CON) 10 MEQ tablet Take 1 tablet (10 mEq total) by mouth daily. 90 tablet 2  . rivaroxaban (XARELTO) 2.5 MG TABS tablet Take 1 tablet (2.5 mg total) by mouth 2 (two) times daily. 60 tablet 0  . rosuvastatin (CRESTOR) 20 MG tablet Take 1 tablet (20 mg total) by mouth at bedtime. 90 tablet 3  . senna-docusate (SENOKOT-S) 8.6-50 MG tablet Take 1 tablet by mouth 2 (two) times daily. 30 tablet 0  . umeclidinium-vilanterol (ANORO ELLIPTA) 62.5-25 MCG/INH AEPB Inhale 1 puff into the lungs daily. 60 each 10  . Evolocumab (REPATHA SURECLICK) 606 MG/ML SOAJ Inject 140 mg into the skin every 14 (fourteen) days. 2 pen 11  . losartan (COZAAR) 25 MG tablet Take 1 tablet (25 mg total) by mouth daily. 90 tablet 3   No current facility-administered medications for this visit.     Allergies:   Latex; Shellfish allergy; Sulfonamide derivatives; Watermelon [citrullus vulgaris]; and Other   Social History:  The patient  reports that she quit smoking about 8 years ago. Her smoking use included cigarettes. She has a 38.00 pack-year smoking history. She has never used smokeless tobacco. She reports that she does not drink alcohol or  use drugs.   Family History:   family history includes Arthritis in her sister; Brain cancer in her sister; Breast cancer (age of onset: 41) in her mother; Cancer in her father and mother; Colon cancer (age of onset: 65) in her father; Diabetes in her brother, father, and sister; Glaucoma in her father; Heart disease in her father; Heart failure in her father; Hypertension in her brother, father, and sister; Kidney disease in her sister; Other in her father and sister.    Review of Systems: Review of Systems  Constitutional: Negative.   Respiratory: Negative.   Gastrointestinal: Negative.  Musculoskeletal: Positive for back pain, joint pain and myalgias.  Neurological: Negative.   Psychiatric/Behavioral: Negative.   All other systems reviewed and are negative.    PHYSICAL EXAM: VS:  BP 108/62 (BP Location: Left Arm, Patient Position: Sitting, Cuff Size: Normal)   Pulse 63   Ht 5\' 5"  (1.651 m)   Wt 208 lb 12 oz (94.7 kg)   BMI 34.74 kg/m  , BMI Body mass index is 34.74 kg/m. Constitutional:  oriented to person, place, and time. No distress.  HENT:  Head: Grossly normal Eyes:  no discharge. No scleral icterus.  Neck: No JVD, no carotid bruits  Cardiovascular: Regular rate and rhythm, no murmurs appreciated Pulmonary/Chest: Clear to auscultation bilaterally, no wheezes or rails Abdominal: Soft.  no distension.  no tenderness.  Musculoskeletal: Normal range of motion Neurological:  normal muscle tone. Coordination normal. No atrophy Skin: Skin warm and dry Psychiatric: normal affect, pleasant  Recent Labs: 04/15/2018: ALT 15 09/05/2018: BUN 14; Creatinine, Ser 0.98; Hemoglobin 12.5; Platelets 170; Potassium 3.9; Sodium 139    Lipid Panel Lab Results  Component Value Date   CHOL 154 04/15/2018   HDL 40.50 04/15/2018   LDLCALC 81 04/15/2018   TRIG 166.0 (H) 04/15/2018      Wt Readings from Last 3 Encounters:  09/16/18 208 lb 12 oz (94.7 kg)  09/14/18 209 lb (94.8 kg)   09/05/18 210 lb (95.3 kg)      ASSESSMENT AND PLAN:  CAD/ CABG: With stable angina Continue aspirin with Xarelto 2.5 mg BID Given she has PAD , CABG Otherwise stable Denies any anginal symptoms  Hyperlipidemia LDL above goal, repeat lipid panel ordered If LDL continues to run above goal we would start Repatha She has had myalgias on higher dose Crestor Long discussion concerning PCSK9 inhibitors, types of medications, mechanism, delivery  Secondary hypertension, unspecified -  Initial blood pressure running low, repeat blood pressure adequate 165 systolic Recommend we stop the lisinopril secondary to cough and start losartan 25 mg daily She will monitor blood pressure at home  OSA on CPAP  Previously seen by pulmonary Tested positive per the patient Management per primary care  COPD   stopped smoking several years ago Stable.   Chronic pain Chest, legs, back, arms Managed by chiropractic, massage Will avoid increasing her Crestor  Leg swelling Lasix as needed for leg swelling, shortness of breath   Total encounter time more than 25 minutes  Greater than 50% was spent in counseling and coordination of care with the patient  Disposition:   F/U  6 months    Orders Placed This Encounter  Procedures  . Hepatic function panel  . Lipid panel  . EKG 12-Lead     Signed, Esmond Plants, M.D., Ph.D. 09/16/2018  [place], [place]

## 2018-09-16 ENCOUNTER — Encounter: Payer: Self-pay | Admitting: Cardiovascular Disease

## 2018-09-16 ENCOUNTER — Ambulatory Visit (INDEPENDENT_AMBULATORY_CARE_PROVIDER_SITE_OTHER): Payer: Medicare HMO | Admitting: Cardiovascular Disease

## 2018-09-16 VITALS — BP 108/62 | HR 63 | Ht 65.0 in | Wt 208.8 lb

## 2018-09-16 DIAGNOSIS — Z951 Presence of aortocoronary bypass graft: Secondary | ICD-10-CM

## 2018-09-16 DIAGNOSIS — I25118 Atherosclerotic heart disease of native coronary artery with other forms of angina pectoris: Secondary | ICD-10-CM

## 2018-09-16 DIAGNOSIS — Z87891 Personal history of nicotine dependence: Secondary | ICD-10-CM | POA: Diagnosis not present

## 2018-09-16 DIAGNOSIS — E782 Mixed hyperlipidemia: Secondary | ICD-10-CM

## 2018-09-16 DIAGNOSIS — I739 Peripheral vascular disease, unspecified: Secondary | ICD-10-CM

## 2018-09-16 DIAGNOSIS — I5022 Chronic systolic (congestive) heart failure: Secondary | ICD-10-CM | POA: Diagnosis not present

## 2018-09-16 DIAGNOSIS — I951 Orthostatic hypotension: Secondary | ICD-10-CM

## 2018-09-16 MED ORDER — LOSARTAN POTASSIUM 25 MG PO TABS: 25.0000 mg | ORAL_TABLET | Freq: Every day | ORAL | 3 refills | Status: DC

## 2018-09-16 MED ORDER — EVOLOCUMAB 140 MG/ML ~~LOC~~ SOAJ: 140.0000 mg | SUBCUTANEOUS | 11 refills | Status: DC

## 2018-09-16 NOTE — Patient Instructions (Addendum)
Medication Instructions:  Your physician has recommended you make the following change in your medication:  1. START Repatha 140 mg. Please let us know if you have any questions.  2. STOP Lisinopril 3. START Losartan 25 mg once daily  If you need a refill on your cardiac medications before your next appointment, please call your pharmacy.    Lab work: Liver and Lipid panel done today.    If you have labs (blood work) drawn today and your tests are completely normal, you will receive your results only by: [name] MyChart Message (if you have MyChart) OR . A paper copy in the mail If you have any lab test that is abnormal or we need to change your treatment, we will call you to review the results.   Testing/Procedures: No new testing needed   Follow-Up: At [facility], you and your health needs are our priority.  As part of our continuing mission to provide you with exceptional heart care, we have created designated Provider Care Teams.  These Care Teams include your primary Cardiologist (physician) and Advanced Practice Providers (APPs -  Physician Assistants and Nurse Practitioners) who all work together to provide you with the care you need, when you need it.  . You will need a follow up appointment in 6 months .   Please call our office 2 months in advance to schedule this appointment.    . Providers on your designated Care Team:   . Murray Hodgkins, NP . Christell Faith, PA-C . Marrianne Mood, PA-C  Any Other Special Instructions Will Be Listed Below (If Applicable).  For educational health videos Log in to : [email or web address] Or : [email or web address], password : [email or web address]

## 2018-09-17 ENCOUNTER — Telehealth: Payer: Self-pay

## 2018-09-17 LAB — HEPATIC FUNCTION PANEL
ALK PHOS: 68 IU/L (ref 39–117)
ALT: 19 IU/L (ref 0–32)
AST: 19 IU/L (ref 0–40)
Albumin: 4.5 g/dL (ref 3.8–4.9)
BILIRUBIN, DIRECT: 0.11 mg/dL (ref 0.00–0.40)
Bilirubin Total: 0.3 mg/dL (ref 0.0–1.2)
TOTAL PROTEIN: 6.7 g/dL (ref 6.0–8.5)

## 2018-09-17 LAB — LIPID PANEL
Chol/HDL Ratio: 3.3 ratio (ref 0.0–4.4)
Cholesterol, Total: 143 mg/dL (ref 100–199)
HDL: 44 mg/dL (ref >39)
LDL CALC: 79 mg/dL (ref 0–99)
Triglycerides: 98 mg/dL (ref 0–149)
VLDL Cholesterol Cal: 20 mg/dL (ref 5–40)

## 2018-09-17 NOTE — Telephone Encounter (Signed)
Prior Authorization sent to plan for Repatha SureClick 962 mg/mL auto injectors ([employer] [id number]) -[id number]. Awaiting for approval.

## 2018-09-17 NOTE — Telephone Encounter (Signed)
Copied from [place] [id number]. Topic: General - Other >> Sep 17, 2018 11:42 AM [name] R wrote: Patient called in and stated she will be in on monday to pick up inhalar

## 2018-09-20 NOTE — Telephone Encounter (Signed)
Spoke with patient and made her aware that prior authorization was completed and she should check with her pharmacy regarding medication. She was appreciative for the help with no further questions at this time.

## 2018-09-20 NOTE — Telephone Encounter (Signed)
Prior authorization approved from 07/26/2018 to 07/28/2019 for [place]. Referral number was [id number]

## 2018-09-22 ENCOUNTER — Other Ambulatory Visit: Payer: Self-pay | Admitting: Cardiovascular Disease

## 2018-09-28 DIAGNOSIS — R69 Illness, unspecified: Secondary | ICD-10-CM | POA: Diagnosis not present

## 2018-09-29 DIAGNOSIS — R69 Illness, unspecified: Secondary | ICD-10-CM | POA: Diagnosis not present

## 2018-10-05 ENCOUNTER — Ambulatory Visit (INDEPENDENT_AMBULATORY_CARE_PROVIDER_SITE_OTHER): Payer: Medicare HMO | Admitting: Family Medicine

## 2018-10-05 ENCOUNTER — Encounter: Payer: Self-pay | Admitting: Family Medicine

## 2018-10-05 VITALS — BP 100/60 | HR 65 | Temp 98.1°F | Resp 18 | Ht 65.0 in | Wt 213.2 lb

## 2018-10-05 DIAGNOSIS — K5792 Diverticulitis of intestine, part unspecified, without perforation or abscess without bleeding: Secondary | ICD-10-CM | POA: Diagnosis not present

## 2018-10-05 DIAGNOSIS — R1032 Left lower quadrant pain: Secondary | ICD-10-CM | POA: Diagnosis not present

## 2018-10-05 LAB — POCT URINALYSIS DIPSTICK
BILIRUBIN UA: NEGATIVE
Blood, UA: NEGATIVE
Glucose, UA: NEGATIVE
Ketones, UA: NEGATIVE
Leukocytes, UA: NEGATIVE
Nitrite, UA: NEGATIVE
Protein, UA: NEGATIVE
Spec Grav, UA: 1.02 (ref 1.010–1.025)
Urobilinogen, UA: 0.2 E.U./dL
pH, UA: 6 (ref 5.0–8.0)

## 2018-10-05 LAB — COMPREHENSIVE METABOLIC PANEL
ALT: 35 U/L (ref 0–35)
AST: 32 U/L (ref 0–37)
Albumin: 4.1 g/dL (ref 3.5–5.2)
Alkaline Phosphatase: 59 U/L (ref 39–117)
BUN: 13 mg/dL (ref 6–23)
CALCIUM: 9.8 mg/dL (ref 8.4–10.5)
CO2: 28 mEq/L (ref 19–32)
CREATININE: 0.95 mg/dL (ref 0.40–1.20)
Chloride: 104 mEq/L (ref 96–112)
GFR: 72.57 mL/min (ref >60.00)
Glucose, Bld: 87 mg/dL (ref 70–99)
Potassium: 3.9 mEq/L (ref 3.5–5.1)
Sodium: 141 mEq/L (ref 135–145)
Total Bilirubin: 0.3 mg/dL (ref 0.2–1.2)
Total Protein: 6.8 g/dL (ref 6.0–8.3)

## 2018-10-05 LAB — CBC WITH DIFFERENTIAL/PLATELET
Basophils Absolute: 0.1 10*3/uL (ref 0.0–0.1)
Basophils Relative: 0.9 % (ref 0.0–3.0)
Eosinophils Absolute: 0.3 10*3/uL (ref 0.0–0.7)
Eosinophils Relative: 3.7 % (ref 0.0–5.0)
HEMATOCRIT: 37.6 % (ref 36.0–46.0)
HEMOGLOBIN: 12.7 g/dL (ref 12.0–15.0)
Lymphocytes Relative: 43.4 % (ref 12.0–46.0)
Lymphs Abs: 3 10*3/uL (ref 0.7–4.0)
MCHC: 33.7 g/dL (ref 30.0–36.0)
MCV: 92.4 fl (ref 78.0–100.0)
Monocytes Absolute: 0.4 10*3/uL (ref 0.1–1.0)
Monocytes Relative: 5.4 % (ref 3.0–12.0)
Neutro Abs: 3.2 10*3/uL (ref 1.4–7.7)
Neutrophils Relative %: 46.6 % (ref 43.0–77.0)
Platelets: 178 10*3/uL (ref 150.0–400.0)
RBC: 4.08 Mil/uL (ref 3.87–5.11)
RDW: 14.7 % (ref 11.5–15.5)
WBC: 6.9 10*3/uL (ref 4.0–10.5)

## 2018-10-05 MED ORDER — AMOXICILLIN-POT CLAVULANATE 875-125 MG PO TABS: 1.0000 | ORAL_TABLET | Freq: Two times a day (BID) | ORAL | 0 refills | Status: DC

## 2018-10-05 NOTE — Progress Notes (Signed)
Subjective:    Patient ID: [name], female    DOB: 1960-10-21, 58 y.o.   MRN: [id number]  HPI   Patient presents to clinic complaining of left lower quadrant abdominal pain.  Patient states pain is been there for 3 days, and at times will be very sharp and to rated as a 10 out of 10.  Currently would rated as a 5 out of 10.  Patient does have a history of abdominal pain, did have a CT scan back in February that showed diverticulosis but no acute diverticulitis.  Denies nausea or vomiting.  Denies diarrhea.  Denies urinary issues.  Most recent colonoscopy was in July 2018  Patient Active Problem List   Diagnosis Date Noted  . Allergic rhinitis 08/26/2018  . Bunion of right foot 08/26/2018  . Fall 05/14/2018  . Anxiety and depression 04/05/2018  . Numbness and tingling of right arm 02/24/2018  . DM2 (diabetes mellitus, type 2) ([place]) 12/18/2017  . Urinary incontinence 12/18/2017  . Lumbar radiculopathy 12/18/2017  . 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 Abnormal feces   . Benign neoplasm of ascending colon   . Intractable vomiting with nausea   . Acute esophagogastric ulcer   . Gastritis without bleeding   . Vasomotor flushing 11/24/2016  . Morbid obesity ([place]) 10/29/2015  . Back ache 06/19/2015  . Colon polyp 06/19/2015  . CCF (congestive cardiac failure) ([place]) 06/19/2015  . Family history of colon cancer 06/19/2015  . Orthostatic hypotension 04/30/2015  . Hematochezia 01/11/2015  . History of colonic polyps 01/11/2015  . Atypical chest pain 01/20/2014  . Diarrhea 06/03/2013  . Bronchitis 01/03/2013  . Right knee pain 12/20/2011  . HTN (hypertension) 12/20/2011  . Systolic CHF, chronic ([place])   . History of cervical cancer   . Depression   . GERD (gastroesophageal reflux disease)   . Seasonal allergies   . History of drug abuse ([place])   . PVD (peripheral vascular disease) ([place]) 02/03/2011  . 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 Smoking history 12/16/2010  .  Hyperlipidemia 08/23/2010  . Coronary atherosclerosis of native coronary artery 08/23/2010  . CLAUDICATION 08/23/2010  . CAROTID BRUIT 08/23/2010  . SHORTNESS OF BREATH 08/23/2010   Social History   Tobacco Use  . Smoking status: Former Smoker    Packs/day: 1.00    Years: 38.00    Pack years: 38.00    Types: Cigarettes    Last attempt to quit: 08/12/2010    Years since quitting: 8.1  . Smokeless tobacco: Never Used  Substance Use Topics  . Alcohol use: No   Review of Systems  Constitutional: Negative for chills, fatigue and fever.  HENT: Negative for congestion, ear pain, sinus pain and sore throat.   Eyes: Negative.   Respiratory: Negative for cough, shortness of breath and wheezing.   Cardiovascular: Negative for chest pain, palpitations and leg swelling.  Gastrointestinal: +LLQ abd pain. Negative for diarrhea, nausea and vomiting.  Genitourinary: Negative for dysuria, frequency and urgency.  Musculoskeletal: Negative for arthralgias and myalgias.  Skin: Negative for color change, pallor and rash.  Neurological: Negative for syncope, light-headedness and headaches.  Psychiatric/Behavioral: The patient is not nervous/anxious.       Objective:   Physical Exam Vitals signs and nursing note reviewed.  Constitutional:      General: She is not in acute distress.    Appearance: She is not ill-appearing, toxic-appearing or diaphoretic.  HENT:     Head: Normocephalic and atraumatic.  Cardiovascular:  Rate and Rhythm: Normal rate and regular rhythm.     Heart sounds: Normal heart sounds.  Pulmonary:     Effort: Pulmonary effort is normal. No respiratory distress.     Breath sounds: Normal breath sounds.  Abdominal:     General: Bowel sounds are normal.     Palpations: Abdomen is soft.     Tenderness: There is abdominal tenderness in the left lower quadrant. There is guarding.  Skin:    General: Skin is warm and dry.  Neurological:     Mental Status: She is alert and  oriented to person, place, and time.  Psychiatric:        Mood and Affect: Mood normal.        Behavior: Behavior normal.    Vitals:   10/05/18 1310  BP: 100/60  Pulse: 65  Resp: 18  Temp: 98.1 F (36.7 C)  SpO2: 93%      Assessment & Plan:    Left lower quadrant pain/suspected diverticulitis- patient is tender in the left lower quadrant, so diverticulitis is suspected. UA unremarkable. Patient will take course of Augmentin to treat.  We will get lab work in clinic including CBC and CMP and also get new CT scan ordered.  Patient advised to follow a bland diet with clear liquids for the next 48 hours and then slowly advance diet as tolerated, often gut rest is quite helpful to reduce inflammation in the colon.  Patient will be called with time for her CT scan appointment.  Advised to call office right away if pain worsens and or go to emergency room for evaluation.

## 2018-10-05 NOTE — Patient Instructions (Signed)
Bland Diet  A bland diet consists of foods that are often soft and do not have a lot of fat, fiber, or extra seasonings. Foods without fat, fiber, or seasoning are easier for the body to digest. They are also less likely to irritate your mouth, throat, stomach, and other parts of your digestive system. A bland diet is sometimes called a BRAT diet.  What is my plan?  Your health care provider or food and nutrition specialist (dietitian) may recommend specific changes to your diet to prevent symptoms or to treat your symptoms. These changes may include:   Eating small meals often.   Cooking food until it is soft enough to chew easily.   Chewing your food well.   Drinking fluids slowly.   Not eating foods that are very spicy, sour, or fatty.   Not eating citrus fruits, such as oranges and grapefruit.  What do I need to know about this diet?   Eat a variety of foods from the bland diet food list.   Do not follow a bland diet longer than needed.   Ask your health care provider whether you should take vitamins or supplements.  What foods can I eat?  Grains    Hot cereals, such as cream of wheat. Rice. Bread, crackers, or tortillas made from refined white flour.  Vegetables  Canned or cooked vegetables. Mashed or boiled potatoes.  Fruits    Bananas. Applesauce. Other types of cooked or canned fruit with the skin and seeds removed, such as canned peaches or pears.  Meats and other proteins    Scrambled eggs. Creamy peanut butter or other nut butters. Lean, well-cooked meats, such as chicken or fish. Tofu. Soups or broths.  Dairy  Low-fat dairy products, such as milk, cottage cheese, or yogurt.  Beverages    Water. Herbal tea. Apple juice.  Fats and oils  Mild salad dressings. Canola or olive oil.  Sweets and desserts  Pudding. Custard. Fruit gelatin. Ice cream.  The items listed above may not be a complete list of recommended foods and beverages. Contact a dietitian for more options.  What foods are not  recommended?  Grains  Whole grain breads and cereals.  Vegetables  Raw vegetables.  Fruits  Raw fruits, especially citrus, berries, or dried fruits.  Dairy  Whole fat dairy foods.  Beverages  Caffeinated drinks. Alcohol.  Seasonings and condiments  Strongly flavored seasonings or condiments. Hot sauce. Salsa.  Other foods  Spicy foods. Fried foods. Sour foods, such as pickled or fermented foods. Foods with high sugar content. Foods high in fiber.  The items listed above may not be a complete list of foods and beverages to avoid. Contact a dietitian for more information.  Summary   A bland diet consists of foods that are often soft and do not have a lot of fat, fiber, or extra seasonings.   Foods without fat, fiber, or seasoning are easier for the body to digest.   Check with your health care provider to see how long you should follow this diet plan. It is not meant to be followed for long periods.  This information is not intended to replace advice given to you by your health care provider. Make sure you discuss any questions you have with your health care provider.  Document Released: 11/05/2015 Document Revised: 08/12/2017 Document Reviewed: 08/12/2017  Elsevier Interactive Patient Education  2019 [employer]

## 2018-10-08 ENCOUNTER — Telehealth: Payer: Self-pay

## 2018-10-08 NOTE — Telephone Encounter (Signed)
[name], this is an Pharmacist, hospital for you.  CT scan has been ordered but not scheduled. Pt having persistent pain and said that she could barely move around enough to get a shower this morning. Advised patient to go ahead and go over to the ED so they can do the scan and treat as needed once evaluated. Advised that pain medication will not help if there is something more acute going on so she needs to be seen. Per [name]'s last note that was her advice as well. Patient agreed to go.    Copied from [place]. Topic: General - Other >> Oct 08, 2018 11:03 AM [name] wrote: Relation to pt: self  Call back number: [phone]   Reason for call:  Patient was last seen 10/05/2018 by Philis Nettle for diverticulitis, patient states symptoms have not improved, patient seeking pain medication, please advise

## 2018-10-13 ENCOUNTER — Ambulatory Visit
Admission: RE | Admit: 2018-10-13 | Discharge: 2018-10-13 | Disposition: A | Discharge status: Home / Self Care | Payer: Medicare HMO | Source: Ambulatory Visit | Attending: Internal Medicine | Admitting: Internal Medicine

## 2018-10-13 ENCOUNTER — Telehealth: Payer: Self-pay

## 2018-10-13 ENCOUNTER — Other Ambulatory Visit: Payer: Self-pay

## 2018-10-13 DIAGNOSIS — Z1231 Encounter for screening mammogram for malignant neoplasm of breast: Secondary | ICD-10-CM | POA: Insufficient documentation

## 2018-10-13 DIAGNOSIS — E2839 Other primary ovarian failure: Secondary | ICD-10-CM

## 2018-10-13 DIAGNOSIS — Z13820 Encounter for screening for osteoporosis: Secondary | ICD-10-CM | POA: Diagnosis not present

## 2018-10-13 NOTE — Telephone Encounter (Signed)
Caller name: Elene  Relation to pt: [place] / [employer]  Call back number:[phone]  Pharmacy:  Reason for call:  Regarding message below. Case # [id number]

## 2018-10-13 NOTE — Telephone Encounter (Signed)
Copied from [place] [id number]. Topic: Referral - Status >> Oct 13, 2018  2:17 PM Scherrie Gerlach wrote: Reason for CRM: [employer] calling to advise the MRI has been denied. It needs a peer to peer or predetermination [phone]  option 4 Ref no:  [id number]

## 2018-10-18 ENCOUNTER — Other Ambulatory Visit: Payer: Self-pay | Admitting: Internal Medicine

## 2018-10-18 DIAGNOSIS — M25552 Pain in left hip: Principal | ICD-10-CM

## 2018-10-18 DIAGNOSIS — M25551 Pain in right hip: Secondary | ICD-10-CM

## 2018-10-18 DIAGNOSIS — M76899 Other specified enthesopathies of unspecified lower limb, excluding foot: Secondary | ICD-10-CM

## 2018-10-18 NOTE — Telephone Encounter (Signed)
MRI lumbar approved [id number] exp date 01/06/2019 will call to schedule   MRIs b/l hips denied she needs XRAYS 1st per her insurance and has to wait 60 days before MRI approved or f/u with her orthopedic doctor about her hip pain after Xrays I will order b/l hip Xrays for now   [place]

## 2018-10-19 NOTE — Telephone Encounter (Signed)
Patient has been notified

## 2018-10-20 ENCOUNTER — Ambulatory Visit: Payer: Medicare HMO

## 2018-10-31 ENCOUNTER — Other Ambulatory Visit: Payer: Self-pay | Admitting: Cardiovascular Disease

## 2018-11-02 ENCOUNTER — Telehealth: Payer: Self-pay | Admitting: Internal Medicine

## 2018-11-02 NOTE — Telephone Encounter (Signed)
Attempted to CB pt regarding left shoulder pain. Left VM to CB.

## 2018-11-02 NOTE — Telephone Encounter (Signed)
Patient called and left message saying she is having exteremly bad left shoulder pain.  Patient said she feeling like it's not a heart attack but she is a little afraid because it is on her left side.

## 2018-11-03 ENCOUNTER — Ambulatory Visit: Payer: Medicare HMO | Admitting: Internal Medicine

## 2018-11-03 ENCOUNTER — Telehealth: Payer: Self-pay | Admitting: Internal Medicine

## 2018-11-03 NOTE — Telephone Encounter (Signed)
Called and spoke w/ pt.  Pt c/o having shoulder and neck pain on the left-side that extends down her arm to her elbow radiating from the left side of her back.  Pt described the pain as being "burning, pulling, tugging".  Pt said the pain is similar to the same type of pain that she experienced a year ago on the right side before she had surgery to remove a vertebrae from her neck.  Pt denies having any numbness, no SOB, no sweating.  Pt said that she does not feel like it is a heart attack since she knows the symptoms because she has had quadruple bypass surgery.  Pt scheduled same day DOXY virtual appt w/ PCP @ 2:30 pm.    Cell phone #:  [phone]

## 2018-11-03 NOTE — Telephone Encounter (Signed)
Patient stated she will be calling once off current call.

## 2018-11-03 NOTE — Telephone Encounter (Signed)
Before our visit today she needs to call Dr. Lacinda Axon Neurosurgerys office and let him know she is having neck pain after neck surgery as he is the specialist about her neck   And shoulder pain is best discussed with ortho Dr. Merri Ray, Janeece Agee, MD  [address]  [place], [place] [address]  [phone]  [phone] ([address]    [place], [place] [address]  [place], [place] [address]  [phone]  [phone] (Fax)  S/P laminectomy (Primary Dx);  Fall due to wet surface, initial encounter;  Neck pain;  Acute low back pain, unspecified back pain laterality, unspecified whether sciatica present   Lauris Poag, MD  [address]  [facility] [place], [place] [address]  [phone]  [phone] ([address])    Feliberto Gottron, [place]  [address] [place], [place] [address]  [phone]  [phone] (Fax)   [employer]

## 2018-11-03 NOTE — Telephone Encounter (Addendum)
Pt called to report that she has an appt scheduled w/ her neurosurgeon for tomorrow @ 10:30 am.  This will be an in-person visit.  Per Dr. Aundra Dubin, ok to cancel appt pt has scheduled for today w/ this office and advised pt to wear face mask to visit w/ neurosurgeon tomorrow.

## 2018-11-03 NOTE — Telephone Encounter (Signed)
Instructed pt to call NS Dr. Deetta Perla as well   [place]

## 2018-11-03 NOTE — Telephone Encounter (Signed)
See  Prior note   [place]

## 2018-11-04 DIAGNOSIS — M5412 Radiculopathy, cervical region: Secondary | ICD-10-CM | POA: Diagnosis not present

## 2018-11-04 DIAGNOSIS — M5030 Other cervical disc degeneration, unspecified cervical region: Secondary | ICD-10-CM | POA: Diagnosis not present

## 2018-11-15 ENCOUNTER — Ambulatory Visit
Admission: RE | Admit: 2018-11-15 | Discharge: 2018-11-15 | Disposition: A | Discharge status: Home / Self Care | Payer: Medicare HMO | Source: Ambulatory Visit | Attending: Internal Medicine | Admitting: Internal Medicine

## 2018-11-15 ENCOUNTER — Other Ambulatory Visit: Payer: Self-pay

## 2018-11-15 DIAGNOSIS — G8929 Other chronic pain: Secondary | ICD-10-CM | POA: Diagnosis not present

## 2018-11-15 DIAGNOSIS — M5416 Radiculopathy, lumbar region: Secondary | ICD-10-CM | POA: Diagnosis not present

## 2018-11-15 DIAGNOSIS — M5440 Lumbago with sciatica, unspecified side: Secondary | ICD-10-CM | POA: Diagnosis not present

## 2018-11-15 DIAGNOSIS — M5136 Other intervertebral disc degeneration, lumbar region: Secondary | ICD-10-CM

## 2018-11-15 DIAGNOSIS — M7138 Other bursal cyst, other site: Secondary | ICD-10-CM | POA: Diagnosis not present

## 2018-11-16 ENCOUNTER — Telehealth: Payer: Self-pay | Admitting: Internal Medicine

## 2018-11-16 ENCOUNTER — Telehealth: Payer: Self-pay

## 2018-11-16 DIAGNOSIS — M5030 Other cervical disc degeneration, unspecified cervical region: Secondary | ICD-10-CM | POA: Diagnosis not present

## 2018-11-16 DIAGNOSIS — M5412 Radiculopathy, cervical region: Secondary | ICD-10-CM | POA: Diagnosis not present

## 2018-11-16 NOTE — Telephone Encounter (Signed)
Copied from [place] [phone]. Topic: General - Other >> Nov 16, 2018  1:22 PM Pauline Good wrote: Reason for CRM: Pt's orthopedic doctor want to inject some steroids in your back and pt want to know if this is ok since she  has already had several steroids due to pain. Pt is concerned about her insulin going up and want feedback from PCP. Please advise pt

## 2018-11-16 NOTE — Telephone Encounter (Signed)
Schedule comp labs orders were in since 07/2018  Fasting   A1C 03/2018 was 6.8 she is right steroids may make her sugar go up temporarily but its weighing risks vs benefits may help her pain   TMS

## 2018-11-16 NOTE — Telephone Encounter (Signed)
Copied from [place] [phone]. Topic: General - Other >> Nov 15, 2018  3:54 PM Yvette Rack wrote: Reason for CRM: RJ with [facility] stated a fax was sent on 11/13/18 regarding pt diabetic supplies and he was calling to follow up. Cb# [phone]

## 2018-11-16 NOTE — Telephone Encounter (Signed)
Mychart message sent for patient to return call back and schedule Lab appointment. PEC may give information.

## 2018-11-16 NOTE — Telephone Encounter (Signed)
Copied from [place] [phone]. Topic: General - Other >> Nov 16, 2018  1:22 PM Pauline Good wrote: Reason for CRM: Pt's orthopedic doctor want to inject some steroids in your back and pt want to know if this is ok since she  has already had several steroids due to pain. Pt is concerned about her insulin going up and want feedback from PCP. Please advise pt

## 2018-11-17 ENCOUNTER — Telehealth: Payer: Self-pay | Admitting: Pulmonary Disease

## 2018-11-17 DIAGNOSIS — R69 Illness, unspecified: Secondary | ICD-10-CM | POA: Diagnosis not present

## 2018-11-17 NOTE — Telephone Encounter (Signed)
Called and spoke to Remer with [facility], who wanted to verify that cpap supplies request had been recieved. I have made [place] aware that pt was last seen 10/30/2016 and was instructed to f/u in 6wk, therefore an appt is needed prior to refilling supplies.  RJ stated that he would make note and reach out to pt. Nothing further is needed at this time.

## 2018-11-18 ENCOUNTER — Encounter: Payer: Self-pay | Admitting: Pulmonary Disease

## 2018-11-18 DIAGNOSIS — M25461 Effusion, right knee: Secondary | ICD-10-CM | POA: Diagnosis not present

## 2018-11-18 DIAGNOSIS — M1711 Unilateral primary osteoarthritis, right knee: Secondary | ICD-10-CM | POA: Diagnosis not present

## 2018-11-18 NOTE — Telephone Encounter (Signed)
Error

## 2018-11-19 ENCOUNTER — Ambulatory Visit: Admission: RE | Admit: 2018-11-19 | Payer: Medicare HMO | Source: Ambulatory Visit

## 2018-11-24 ENCOUNTER — Other Ambulatory Visit: Payer: Self-pay

## 2018-11-24 ENCOUNTER — Ambulatory Visit
Admission: RE | Admit: 2018-11-24 | Discharge: 2018-11-24 | Disposition: A | Discharge status: Home / Self Care | Payer: Medicare HMO | Source: Ambulatory Visit | Attending: Family Medicine | Admitting: Family Medicine

## 2018-11-24 DIAGNOSIS — R1032 Left lower quadrant pain: Secondary | ICD-10-CM | POA: Diagnosis not present

## 2018-11-24 DIAGNOSIS — K5730 Diverticulosis of large intestine without perforation or abscess without bleeding: Secondary | ICD-10-CM | POA: Diagnosis not present

## 2018-11-24 DIAGNOSIS — E279 Disorder of adrenal gland, unspecified: Secondary | ICD-10-CM | POA: Diagnosis not present

## 2018-11-24 DIAGNOSIS — K760 Fatty (change of) liver, not elsewhere classified: Secondary | ICD-10-CM | POA: Diagnosis not present

## 2018-11-24 LAB — POCT I-STAT CREATININE: Creatinine, Ser: 1.2 mg/dL — ABNORMAL HIGH (ref 0.44–1.00)

## 2018-11-24 MED ORDER — IOHEXOL 300 MG/ML  SOLN
100.0000 mL | Freq: Once | INTRAMUSCULAR | Status: AC | PRN
  Administered 2018-11-24: 85 mL via INTRAVENOUS

## 2018-11-26 ENCOUNTER — Ambulatory Visit (INDEPENDENT_AMBULATORY_CARE_PROVIDER_SITE_OTHER): Payer: Medicare HMO | Admitting: Internal Medicine

## 2018-11-26 DIAGNOSIS — K5909 Other constipation: Secondary | ICD-10-CM | POA: Insufficient documentation

## 2018-11-26 DIAGNOSIS — M25561 Pain in right knee: Secondary | ICD-10-CM

## 2018-11-26 DIAGNOSIS — K760 Fatty (change of) liver, not elsewhere classified: Secondary | ICD-10-CM | POA: Diagnosis not present

## 2018-11-26 DIAGNOSIS — K5900 Constipation, unspecified: Secondary | ICD-10-CM | POA: Diagnosis not present

## 2018-11-26 DIAGNOSIS — M5030 Other cervical disc degeneration, unspecified cervical region: Secondary | ICD-10-CM

## 2018-11-26 DIAGNOSIS — G8929 Other chronic pain: Secondary | ICD-10-CM | POA: Diagnosis not present

## 2018-11-26 DIAGNOSIS — D3502 Benign neoplasm of left adrenal gland: Secondary | ICD-10-CM | POA: Diagnosis not present

## 2018-11-26 DIAGNOSIS — J4530 Mild persistent asthma, uncomplicated: Secondary | ICD-10-CM

## 2018-11-26 DIAGNOSIS — E119 Type 2 diabetes mellitus without complications: Secondary | ICD-10-CM

## 2018-11-26 NOTE — Progress Notes (Addendum)
Virtual Visit via Video Note  I connected with [name]   on 11/26/18 at  9:40 AM EDT by a video enabled telemedicine application and verified that I am speaking with the correct person using two identifiers.  Location patient: home Location provider:work Persons participating in the virtual visit: patient, provider  I discussed the limitations of evaluation and management by telemedicine and the availability of in person appointments. The patient expressed understanding and agreed to proceed.   HPI: 1. Asthma per pulm COPD less likely though responding to anoro and failed Symbicort/Advair in the past.  She tried same of Trelegy due to being out of Breo she reports she could not tell if there was a difference and she still has anoro. She is using neb tx 1x per week c/o phelgm at times dark brown  2. Left Shoulder pain still has intermittently NS did not give her a steroid injection recently s/p cervical spine surgery with Dr. Deetta Perla. She saw on 11/16/2018 and had been given oral steroids 11/04/2018, muscle relaxer, narcotics, gabapentin, tylenol and ice trigger pt injections discussed and agreeable and will consider MRI to eval for nerve compression  3. Chronic right knee pain s/p injection with ortho and aspiration 10/2018   4. CT ab/pelvis 11/24/18 +constipation, stable left adrenal mass benign, fatty liver reviewed with pt she wants to try benefiber and not sure what is constipating her she has tried to increase water intake. Disc meds I.e pain meds and gabapentin can cause constipation   5. DM 2 A1C 6.8 04/15/18 on Metformin XR 500 mg QAM with food, Crestor 20   ROS: See pertinent positives and negatives per HPI.  Past Medical History:  Diagnosis Date  . (HFpEF) heart failure with preserved ejection fraction ([place])    a. 06/2016 Echo: EF 50%, no rwma, mild to mod TR.  [name] Allergy   . Anemia   . Arthritis   . Asthma   . Bacterial vaginitis   . Blood in stool   . Brachial neuritis  or radiculitis NOS   . 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 being followed by neurosurgery/eye doctor and pcp. monitoring size.causes sinus problems  . Bronchitis   . Cardiac arrhythmia   . Cervical neck pain with evidence of disc disease    C5/6 disease, MRI done late 2012 - no records available  . Chronic pain   . Cocaine abuse, in remission ([place])    clean x 24 years  . Colon polyps   . COPD (chronic obstructive pulmonary disease) (HCC)    many inhalers  . Coronary artery disease    a. PCI of LCX 2003; b. PCI of the LAD 2012 with a (2.5 x 8 mm BMS);  c.s/p CABG 4/12:  L-LAD, S-Dx, S-OM, S-RCA (Dr. Prescott Gum);  d. 01/2012 MV: inf infarct, attenuation, no ischemia.  . Depression   . Diabetes mellitus without complication ([place])   . Family history of colon cancer   . Generalized headaches    frequent  . GERD (gastroesophageal reflux disease)   . Headache   . Heart murmur   . Hematochezia   . Hepatitis    history of hepatitis b  . History of cervical cancer    s/p cryotherapy  . History of drug abuse ([place])    cocaine, marijuana, clean since 1989  . History of hepatitis B    from eating undercooked liver  . History of MI (myocardial infarction)   . Hyperlipidemia   .  Hypertension   . Myocardial infarction [facility]) 2003, 2012  . PAD (peripheral artery disease) (HCC)    s/p Right SFA atherectomy and PTA 01/15/11, normal ABIs 01/2011  . Polyp of colon   . Seasonal allergies   . Sleep apnea    uses cpap  . Smoking history    quit 07/2010  . Systolic CHF, chronic (HCC)    mild: echo 08/2010 - mildly reduced EF 40-45%, mild diffuse hypokinesis  . Thyroid disease   . Urinary incontinence   . Urine incontinence     Past Surgical History:  Procedure Laterality Date  . ABDOMINAL HYSTERECTOMY     2003  . CHOLECYSTECTOMY  1986  . COLONOSCOPY  2008   3 polyps  . COLONOSCOPY WITH PROPOFOL N/A 02/06/2015   Procedure: COLONOSCOPY WITH PROPOFOL;  Surgeon: Lucilla Lame,  MD;  Location: [facility] ENDOSCOPY;  Service: Endoscopy;  Laterality: N/A;  . COLONOSCOPY WITH PROPOFOL N/A 01/27/2017   Procedure: COLONOSCOPY WITH PROPOFOL;  Surgeon: Lucilla Lame, MD;  Location: [facility] ENDOSCOPY;  Service: Endoscopy;  Laterality: N/A;  . CORONARY ANGIOPLASTY     w/ stent placement x2  . CORONARY ARTERY BYPASS GRAFT  2012   Dr [name]  . CORONARY STENT PLACEMENT  2003   S/P MI  . CORONARY STENT PLACEMENT  2007   [place]  . ESOPHAGOGASTRODUODENOSCOPY (EGD) WITH PROPOFOL N/A 02/06/2015   Procedure: ESOPHAGOGASTRODUODENOSCOPY (EGD) WITH PROPOFOL;  Surgeon: Lucilla Lame, MD;  Location: [facility] ENDOSCOPY;  Service: Endoscopy;  Laterality: N/A;  . ESOPHAGOGASTRODUODENOSCOPY (EGD) WITH PROPOFOL N/A 01/27/2017   Procedure: ESOPHAGOGASTRODUODENOSCOPY (EGD) WITH PROPOFOL;  Surgeon: Lucilla Lame, MD;  Location: [facility] ENDOSCOPY;  Service: Endoscopy;  Laterality: N/A;  . FEMORAL ARTERY STENT  10/2010   right sided (Dr. Burt Knack)  . KNEE ARTHROSCOPY WITH MEDIAL MENISECTOMY Right 08/20/2017   Procedure: KNEE ARTHROSCOPY WITH MEDIAL  AND LATERAL MENISECTOMY;  Surgeon: Hessie Knows, MD;  Location: [facility] ORS;  Service: Orthopedics;  Laterality: Right;  . POSTERIOR CERVICAL LAMINECTOMY N/A 03/08/2018   Procedure: POSTERIOR CERVICAL LAMINECTOMY-C7;  Surgeon: Deetta Perla, MD;  Location: [facility] ORS;  Service: Neurosurgery;  Laterality: N/A;  . TUBAL LIGATION      Family History  Problem Relation Age of Onset  . Hypertension Father   . Heart failure Father   . Diabetes Father   . Colon cancer Father 37  . Glaucoma Father   . Cancer Father        colorectal   . Heart disease Father   . Other Father        glaucoma  . Breast cancer Mother 55       breast cancer, late 42's  . Cancer Mother        breast  . Brain cancer Sister   . Arthritis Sister   . Diabetes Sister   . Hypertension Sister   . Kidney disease Sister   . Diabetes Brother   . Hypertension Brother   . Other Sister        brain tumor   .  Coronary artery disease Neg Hx   . Stroke Neg Hx     SOCIAL HX: on disability, lives with family, 2 kids    Current Outpatient Medications:  .  gabapentin (NEURONTIN) 300 MG capsule, Take 300 mg by mouth. TID, Disp: , Rfl:  .  HYDROcodone-acetaminophen (NORCO/VICODIN) 5-325 MG tablet, Take by mouth., Disp: , Rfl:  .  albuterol (PROVENTIL HFA;VENTOLIN HFA) 108 (90 Base) MCG/ACT inhaler, Inhale 2 puffs into the lungs  every 6 (six) hours as needed for wheezing or shortness of breath., Disp: 1 Inhaler, Rfl: 3 .  amoxicillin-clavulanate (AUGMENTIN) 875-125 MG tablet, Take 1 tablet by mouth 2 (two) times daily., Disp: 20 tablet, Rfl: 0 .  aspirin 81 MG tablet, Take 1 tablet (81 mg total) by mouth daily., Disp: 90 tablet, Rfl: 3 .  Azelastine HCl 0.15 % SOLN, Place 2 sprays into both nostrils daily as needed (allergies). , Disp: , Rfl:  .  chlorpheniramine-HYDROcodone (TUSSIONEX PENNKINETIC ER) 10-8 MG/5ML SUER, Take 5 mLs by mouth at bedtime as needed for cough., Disp: 140 mL, Rfl: 0 .  clotrimazole (MYCELEX) 10 MG troche, Take 1 tablet (10 mg total) by mouth 5 (five) times daily., Disp: 35 tablet, Rfl: 0 .  esomeprazole (NEXIUM) 40 MG capsule, Take 40 mg by mouth daily. Reported on 09/04/2015, Disp: , Rfl:  .  Evolocumab (REPATHA SURECLICK) 161 MG/ML SOAJ, Inject 140 mg into the skin every 14 (fourteen) days., Disp: 2 pen, Rfl: 11 .  ezetimibe (ZETIA) 10 MG tablet, Take 1 tablet (10 mg total) by mouth daily., Disp: 90 tablet, Rfl: 3 .  fluconazole (DIFLUCAN) 150 MG tablet, Take 1st tablet on day 1, take 2nd tablet on day 4, Disp: 2 tablet, Rfl: 0 .  fluticasone (FLONASE) 50 MCG/ACT nasal spray, Place 2 sprays into both nostrils daily as needed for allergies. , Disp: , Rfl:  .  fluticasone (FLOVENT HFA) 110 MCG/ACT inhaler, Inhale 1 puff into the lungs 2 (two) times a day. Rinse mouth, Disp: 1 Inhaler, Rfl: 12 .  furosemide (LASIX) 20 MG tablet, TAKE 1 TABLET BY MOUTH ONCE DAILY AND A SECOND TABLET IF  NEEDED FOR FLUID BUILD UP., Disp: 30 tablet, Rfl: 3 .  glucose blood (ONE TOUCH ULTRA TEST) test strip, Use as instructed check cbg qd E11.9, Disp: 100 each, Rfl: 12 .  ipratropium-albuterol (DUONEB) 0.5-2.5 (3) MG/3ML SOLN, Take 3 mLs by nebulization 2 (two) times daily as needed., Disp: 360 mL, Rfl: 12 .  isosorbide mononitrate (IMDUR) 30 MG 24 hr tablet, TAKE 1 TABLET BY MOUTH TWICE DAILY . APPOINTMENT REQUIRED FOR FUTURE REFILLS, Disp: 60 tablet, Rfl: 5 .  Lactobacillus (ACIDOPHILUS PROBIOTIC) 10 MG CAPS, Take 1 capsule by mouth daily., Disp: 30 capsule, Rfl: 2 .  Lancets MISC, 1 Device by Does not apply route daily. Lancets E11.9, Disp: 90 each, Rfl: 3 .  levocetirizine (XYZAL) 5 MG tablet, Take 1 tablet (5 mg total) by mouth at bedtime as needed for allergies., Disp: 90 tablet, Rfl: 3 .  losartan (COZAAR) 25 MG tablet, Take 1 tablet (25 mg total) by mouth daily., Disp: 90 tablet, Rfl: 3 .  meclizine (ANTIVERT) 25 MG tablet, Take 25 mg by mouth 3 (three) times daily. , Disp: , Rfl: 3 .  metFORMIN (GLUCOPHAGE-XR) 500 MG 24 hr tablet, Take 1 tablet (500 mg total) by mouth daily with breakfast., Disp: 90 tablet, Rfl: 3 .  methocarbamol (ROBAXIN) 500 MG tablet, 500 mg. QID, Disp: , Rfl:  .  metoprolol tartrate (LOPRESSOR) 25 MG tablet, Take 1 tablet (25 mg total) by mouth 2 (two) times daily., Disp: 180 tablet, Rfl: 3 .  mupirocin ointment (BACTROBAN) 2 %, Apply 1 application topically 2 (two) times daily. Prn scalp x 1 week and nose x 5 days, Disp: 30 g, Rfl: 0 .  nitroGLYCERIN (NITROSTAT) 0.4 MG SL tablet, Place 1 tablet (0.4 mg total) under the tongue every 5 (five) minutes as needed. (Patient taking differently: Place 0.4 mg  under the tongue every 5 (five) minutes as needed for chest pain. ), Disp: 25 tablet, Rfl: 6 .  PARoxetine (PAXIL) 10 MG tablet, Take 1 tablet (10 mg total) by mouth daily., Disp: 30 tablet, Rfl: 11 .  potassium chloride (K-DUR,KLOR-CON) 10 MEQ tablet, Take 1 tablet (10 mEq  total) by mouth daily., Disp: 90 tablet, Rfl: 2 .  rivaroxaban (XARELTO) 2.5 MG TABS tablet, Take 1 tablet (2.5 mg total) by mouth 2 (two) times daily., Disp: 60 tablet, Rfl: 0 .  rosuvastatin (CRESTOR) 20 MG tablet, Take 1 tablet (20 mg total) by mouth at bedtime., Disp: 90 tablet, Rfl: 3 .  senna-docusate (SENOKOT-S) 8.6-50 MG tablet, Take 1 tablet by mouth daily as needed for mild constipation or moderate constipation., Disp: 30 tablet, Rfl: 12 .  umeclidinium-vilanterol (ANORO ELLIPTA) 62.5-25 MCG/INH AEPB, Inhale 1 puff into the lungs daily., Disp: 60 each, Rfl: 10 .  Wheat Dextrin (BENEFIBER) POWD, 2 teaspoons added to 4-8 ounces of liquid or food up to 3x per day prn, Disp: 475 g, Rfl: 11  EXAM:  VITALS per patient if applicable:  GENERAL: alert, oriented, appears well and in no acute distress  HEENT: atraumatic, conjunttiva clear, no obvious abnormalities on inspection of external nose and ears  NECK: normal movements of the head and neck  LUNGS: on inspection no signs of respiratory distress, breathing rate appears normal, no obvious gross SOB, gasping or wheezing  CV: no obvious cyanosis  MS: moves all visible extremities without noticeable abnormality  PSYCH/NEURO: pleasant and cooperative, no obvious depression or anxiety, speech and thought processing grossly intact  ASSESSMENT AND PLAN:  Discussed the following assessment and plan:  Constipation, unspecified constipation type - Plan: Wheat Dextrin (BENEFIBER) POWD, senna-docusate (SENOKOT-S) 8.6-50 MG tablet qd prn  Etiology could be 2/2 meds I.e narcotics and gabapentin   Mild persistent asthma, unspecified whether complicated with allergies - Plan: fluticasone (FLOVENT HFA) 110 MCG/ACT inhaler add this to Anoro to see if helps  COPD ? Note from pulm 10/2016 notes pfts in 2012 revealed no definite obstruction and mild decrease in DLCO which can be seen with COPD/asthma per pulm doubt COPD but she is helped by anoro so  this has been continued up until now.  She tried sample of trelegy (since we were out of Breo) and thought it might have helped. Symbicort in the past last used ~2017 did not find if effective and changed from [place] (failed) to anoro ~05/2016 by pulm and notes 06/2016 reviewed relieved by Anoro could indicate airway hyperactivity. She does have h/o allergies/asthma and did not tolerate Singulair in the past. She is a former smoker. She was previously on ACEI but changed to ARB 09/16/2018 by cards losartan 25 mg qd   -->she is using prn Duoneb, Albuterol and currently Anoro  She failed Symbicort/Advair in the past or did not like  Consider refer back to pulm in future if this is not helping appt sch 12/01/2018 Dr. Alva Garnet Not sure response to breo in the past will ask and if not tried consider breo/incruse  Chronic pain of right knee f/u [facility] ortho   DDD (degenerative disc disease), cervical s/p C7 laminectomy 03/08/18 with Dr. Deetta Perla saw 10/2018 and were c/w trapezius issue and rec trigger point injections   Fatty liver -will mail info to read about fatty liver rec healthy diet and exercise cholesterol and diabetes controlled  -A1C overdue last was 04/15/18 6.8   OSA on cpap per pulm  DM 2  A1c 6.8 04/15/18  On metformin XR 500 mg QAM  Due for repeat A1C  Want pt to check cbg 1x per day range 80s to 120s  She is not on insulin Requests meter from [facility] fax # [phone]   HM Had flu shotutd -will need Tdap and, shingrix in future  pna 21 had 03/10/16  S/p hysterectomy no cervix and f/u OB/GYNh/o abnormal pap s/p cryo  -pap 05/08/15 neg papwill Repeat In 5 years   Colonoscopy Dr. Allen Norris 01/2017 polyps, gastritis, diverticulosis   Mammogram 10/13/2018 negative   DEXA neg 10/13/2018   CT chest had 06/16/17-former smoker  IMPRESSION: 1. Cardiomegaly with left main and three-vessel coronary arteriosclerosis. Status post CABG. 2. No acute pulmonary  embolus, aortic aneurysm or dissection. 3. Bibasilar atelectasis.  Trace bilateral pleural effusions. 4. No acute osseous abnormality.  Aortic Atherosclerosis (ICD10-I70.0).  Had thyroid labs 03/25/17 and 01/27/2018 care everywhere normal0.68 free T4 (01/27/18) and 0.77 and TSH 1.481 (03/25/17)   I discussed the assessment and treatment plan with the patient. The patient was provided an opportunity to ask questions and all were answered. The patient agreed with the plan and demonstrated an understanding of the instructions.   The patient was advised to call back or seek an in-person evaluation if the symptoms worsen or if the condition fails to improve as anticipated.  Time spent 25 minutes  Delorise Jackson, MD

## 2018-11-29 ENCOUNTER — Telehealth: Payer: Self-pay | Admitting: Internal Medicine

## 2018-11-29 ENCOUNTER — Other Ambulatory Visit: Payer: Self-pay | Admitting: Internal Medicine

## 2018-11-29 ENCOUNTER — Encounter: Payer: Self-pay | Admitting: Internal Medicine

## 2018-11-29 DIAGNOSIS — J4530 Mild persistent asthma, uncomplicated: Secondary | ICD-10-CM | POA: Insufficient documentation

## 2018-11-29 MED ORDER — BENEFIBER PO POWD: ORAL | 11 refills | Status: DC

## 2018-11-29 MED ORDER — SENNOSIDES-DOCUSATE SODIUM 8.6-50 MG PO TABS: 1.0000 | ORAL_TABLET | Freq: Every day | ORAL | 12 refills | Status: DC | PRN

## 2018-11-29 MED ORDER — FLUTICASONE PROPIONATE HFA 110 MCG/ACT IN AERO: 1.0000 | INHALATION_SPRAY | Freq: Two times a day (BID) | RESPIRATORY_TRACT | 12 refills | Status: DC

## 2018-11-29 NOTE — Telephone Encounter (Signed)
Patietn has been informed. She does not like Breo.  She is ok with the change.

## 2018-11-29 NOTE — Patient Instructions (Signed)
Nonalcoholic Fatty Liver Disease Diet Nonalcoholic fatty liver disease is a condition that causes fat to accumulate in and around the liver. The disease makes it harder for the liver to work the way that it should. Following a healthy diet can help to keep nonalcoholic fatty liver disease under control. It can also help to prevent or improve conditions that are associated with the disease, such as heart disease, diabetes, high blood pressure, and abnormal cholesterol levels. Along with regular exercise, this diet:  Promotes weight loss.  Helps to control blood sugar levels.  Helps to improve the way that the body uses insulin. What do I need to know about this diet?  Use the glycemic index (GI) to plan your meals. The index tells you how quickly a food will raise your blood sugar. Choose low-GI foods. These foods take a longer time to raise blood sugar.  Keep track of how many calories you take in. Eating the right amount of calories will help you to achieve a healthy weight.  You may want to follow a Mediterranean diet. This diet includes a lot of vegetables, lean meats or fish, whole grains, fruits, and healthy oils and fats. What foods can I eat? Grains Whole grains, such as whole-wheat or whole-grain breads, crackers, tortillas, cereals, and pasta. Stone-ground whole wheat. Pumpernickel bread. Unsweetened oatmeal. Bulgur. Barley. Quinoa. Brown or wild rice. Corn or whole-wheat flour tortillas. Vegetables Lettuce. Spinach. Peas. Beets. Cauliflower. Cabbage. Broccoli. Carrots. Tomatoes. Squash. Eggplant. Herbs. Peppers. Onions. Cucumbers. Brussels sprouts. Yams and sweet potatoes. Beans. Lentils. Fruits Bananas. Apples. Oranges. Grapes. Papaya. Mango. Pomegranate. Kiwi. Grapefruit. Cherries. Meats and Other Protein Sources Seafood and shellfish. Lean meats. Poultry. Tofu. Dairy Low-fat or fat-free dairy products, such as yogurt, cottage cheese, and cheese. Beverages Water. Sugar-free  drinks. Tea. Coffee. Low-fat or skim milk. Milk alternatives, such as soy or almond milk. Real fruit juice. Condiments Mustard. Relish. Low-fat, low-sugar ketchup and barbecue sauce. Low-fat or fat-free mayonnaise. Sweets and Desserts Sugar-free sweets. Fats and Oils Avocado. Canola or olive oil. Nuts and nut butters. Seeds. The items listed above may not be a complete list of recommended foods or beverages. Contact your dietitian for more options. What foods are not recommended? Palm oil and coconut oil. Processed foods. Fried foods. Sweetened drinks, such as sweet tea, milkshakes, snow cones, iced sweet drinks, and sodas. Alcohol. Sweets. Foods that contain a lot of salt or sodium. The items listed above may not be a complete list of foods and beverages to avoid. Contact your dietitian for more information. This information is not intended to replace advice given to you by your health care provider. Make sure you discuss any questions you have with your health care provider. Document Released: 11/28/2014 Document Revised: 12/20/2015 Document Reviewed: 08/08/2014 Elsevier Interactive Patient Education  2019 [employer] Inc.  Fatty Liver Disease  Fatty liver disease occurs when too much fat has built up in your liver cells. Fatty liver disease is also called hepatic steatosis or steatohepatitis. The liver removes harmful substances from your bloodstream and produces fluids that your body needs. It also helps your body use and store energy from the food you eat. In many cases, fatty liver disease does not cause symptoms or problems. It is often diagnosed when tests are being done for other reasons. However, over time, fatty liver can cause inflammation that may lead to more serious liver problems, such as scarring of the liver (cirrhosis) and liver failure. Fatty liver is associated with insulin resistance, increased body  fat, high blood pressure (hypertension), and high cholesterol. These are  features of metabolic syndrome and increase your risk for stroke, diabetes, and heart disease. What are the causes? This condition may be caused by:  Drinking too much alcohol.  Poor nutrition.  Obesity.  Cushing's syndrome.  Diabetes.  High cholesterol.  Certain drugs.  Poisons.  Some viral infections.  Pregnancy. What increases the risk? You are more likely to develop this condition if you:  Abuse alcohol.  Are overweight.  Have diabetes.  Have hepatitis.  Have a high triglyceride level.  Are pregnant. What are the signs or symptoms? Fatty liver disease often does not cause symptoms. If symptoms do develop, they can include:  Fatigue.  Weakness.  Weight loss.  Confusion.  Abdominal pain.  Nausea and vomiting.  Yellowing of your skin and the white parts of your eyes (jaundice).  Itchy skin. How is this diagnosed? This condition may be diagnosed by:  A physical exam and medical history.  Blood tests.  Imaging tests, such as an ultrasound, CT scan, or MRI.  A liver biopsy. A small sample of liver tissue is removed using a needle. The sample is then looked at under a microscope. How is this treated? Fatty liver disease is often caused by other health conditions. Treatment for fatty liver may involve medicines and lifestyle changes to manage conditions such as:  Alcoholism.  High cholesterol.  Diabetes.  Being overweight or obese. Follow these instructions at home:   Do not drink alcohol. If you have trouble quitting, ask your health care provider how to safely quit with the help of medicine or a supervised program. This is important to keep your condition from getting worse.  Eat a healthy diet as told by your health care provider. Ask your health care provider about working with a diet and nutrition specialist (dietitian) to develop an eating plan.  Exercise regularly. This can help you lose weight and control your cholesterol and  diabetes. Talk to your health care provider about an exercise plan and which activities are best for you.  Take over-the-counter and prescription medicines only as told by your health care provider.  Keep all follow-up visits as told by your health care provider. This is important. Contact a health care provider if: You have trouble controlling your:  Blood sugar. This is especially important if you have diabetes.  Cholesterol.  Drinking of alcohol. Get help right away if:  You have abdominal pain.  You have jaundice.  You have nausea and vomiting.  You vomit blood or material that looks like coffee grounds.  You have stools that are black, tar-like, or bloody. Summary  Fatty liver disease develops when too much fat builds up in the cells of your liver.  Fatty liver disease often causes no symptoms or problems. However, over time, fatty liver can cause inflammation that may lead to more serious liver problems, such as scarring of the liver (cirrhosis).  You are more likely to develop this condition if you abuse alcohol, are pregnant, are overweight, have diabetes, have hepatitis, or have high triglyceride levels.  Contact your health care provider if you have trouble controlling your weight, blood sugar, cholesterol, or drinking of alcohol. This information is not intended to replace advice given to you by your health care provider. Make sure you discuss any questions you have with your health care provider. Document Released: 08/29/2005 Document Revised: 04/22/2017 Document Reviewed: 04/22/2017 Elsevier Interactive Patient Education  2019 [employer].

## 2018-11-29 NOTE — Telephone Encounter (Signed)
Please call pharmacy and get updated med list of all meds Rx w/in the last year and fax to me at [phone] attn Dr. Olivia Mackie we need to make sure med list is correct   Thanks [place]

## 2018-11-29 NOTE — Telephone Encounter (Signed)
Call pt she is on anoro I want her to continue this if has been working   Trelegy has anoro plus inhaled steroid.  I want to add Flovent to her anoro (instead of Trelegy) with her history of asthma/allergies 1 puff 2x per day and when sick double to 2 puffs 2 x per day and rinse mouth   Consider allergy testing in upcoming appt with pulmonary 12/01/2018   Did she ever try Breo? The blue top inhaler? I think she did at  [facility] and did not like this one?  She has tried and did not like Symbicort and Advair    ALSO MAIL AVS re: fatty liver information on the printer  Thanks [place]

## 2018-11-30 NOTE — Telephone Encounter (Signed)
Pharmacy will send over fax.

## 2018-12-01 ENCOUNTER — Ambulatory Visit (INDEPENDENT_AMBULATORY_CARE_PROVIDER_SITE_OTHER): Payer: Medicare HMO | Admitting: Pulmonary Disease

## 2018-12-01 ENCOUNTER — Encounter: Payer: Self-pay | Admitting: Pulmonary Disease

## 2018-12-01 DIAGNOSIS — G4733 Obstructive sleep apnea (adult) (pediatric): Secondary | ICD-10-CM

## 2018-12-01 DIAGNOSIS — J449 Chronic obstructive pulmonary disease, unspecified: Secondary | ICD-10-CM

## 2018-12-01 DIAGNOSIS — Z87891 Personal history of nicotine dependence: Secondary | ICD-10-CM | POA: Diagnosis not present

## 2018-12-01 DIAGNOSIS — E668 Other obesity: Secondary | ICD-10-CM

## 2018-12-01 NOTE — Patient Instructions (Signed)
We will need to repeat sleep study to requalify her for CPAP.  Hopefully this can be done as a home sleep study. Continue Anoro and Flovent inhalers Continue albuterol inhaler as needed I confirmed that she remains abstinent from smoking We discussed potential benefits of weight loss with regard to her overall health and with regard to the problem of sleep apnea  Follow-up in 6 weeks with sleep study and PFTs prior to that visit

## 2018-12-01 NOTE — Progress Notes (Signed)
PULMONARY OFFICE FOLLOW UP NOTE  Requesting MD/Service: [name] Date of initial consultation: 04/10/16 Reason for consultation: COPD, OSA  PT PROFILE: 58 y.o. F former smoker with CAD, s/p CABG in 2012 referred by Dr [name] for mgmt of OSA and dyspnea. She was previously diagnosed with COPD but notably, PFTs in 2012 revealed no definite obstruction and mild decrease in DLCO.  DATA: 05/06/16 PSG: AHI 6.8/hr (during REM 11.4/hr). Lowest SpO2 87%. Poor sleep efficiency (52.5%) 05/16/16 PFTs: normal spirometry, normal lung volumes, mild reduction in DLCO 06/09/16 CTA chest: ordered by Dr [name] for CP, dyspnea. Normal study. No PE 06/24/16 CPAP titration: optimal CPAP level 10 cm H2O 09/29/16-10/28/16 compliance report: 13/30 nights. > 4 hrs 13 nights  INTERVAL: Last seen 10/2015. Failed to follow up after that. Has not been compliant with CPAP since last visit  Pre-screening note:  Our staff contacted [name] and offered a "in person", "face-to-face" appointment versus a telephone encounter. He/She preferred a telephone encounter.  Reason for Virtual Visit: COVID-19*  Social distancing based on CDC and AMA recommendations.    I contacted patient by telephone and identified myself as Dr [name].  I verified that I was speaking with [name], 08/21/1960.   Advanced Informed Consent I sought verbal advanced consent from [name] for telemedicine interactions and virtual visit. I informed him/her of the limitations associated with performing an evaluation and management service by telephone. I also informed him/her of the availability of "in person" appointments and I informed him/her of the possibility of a patient responsible charge related to this service. He/she expressed understanding and agreed to proceed with the telephone encounter.     SUBJ: Off CPAP x 2 yrs. She reports frequent nocturnal awakening with gasping/snoring. She reports that her sleep is not  restful and she has moderate daytime sleepiness which has worsened since last visit. She reports one episode where she almost fell asleep at a stop light recently. She has frequent morning HAs which she thinks might be due to chronic sinusitis. She previously used a nasal mask and chin strap because she could not tolerate full mask but she found that even with the chin strap, her mouth was opening during sleep. She reports no major change in body weight.   With regard to repsiratory symptoms, she has remained on Anoro since last seen.  She continues to believe that this medication is of benefit to her.  Recently, Flovent was initiated by Dr [name] due to increased SOB.  She is using albuterol 1-2 times per day.  Of note, her payer will not cover Trelegy.     OBJ:  There were no vitals filed for this visit.    EXAM:  Due to the remote (telephonic) nature of this visit, physical examination cannot be performed  DATA:  BMP Latest Ref Rng & Units 11/24/2018 10/05/2018 09/05/2018  Glucose 70 - 99 mg/dL - 87 102(H)  BUN 6 - 23 mg/dL - 13 14  Creatinine 0.44 - 1.00 mg/dL 1.20(H) 0.95 0.98  BUN/Creat Ratio 9 - 23 - - -  Sodium 135 - 145 mEq/L - 141 139  Potassium 3.5 - 5.1 mEq/L - 3.9 3.9  Chloride 96 - 112 mEq/L - 104 106  CO2 19 - 32 mEq/L - 28 26  Calcium 8.4 - 10.5 mg/dL - 9.8 9.4   CBC Latest Ref Rng & Units 10/05/2018 09/05/2018 03/02/2018  WBC 4.0 - 10.5 K/uL 6.9 8.5 9.5  Hemoglobin 12.0 - 15.0 g/dL 12.7 12.5 15.5  Hematocrit 36.0 - 46.0 % 37.6 38.2 45.5  Platelets 150.0 - 400.0 K/uL 178.0 170 203   CXR 05/14/2018: Bibasilar linear scarring or atelectasis.  Very tiny R pleural effusion versus pleural thickening  CTAP 11/24/18: Visualized lung fields reveal no acute findings  IMPRESSION:     ICD-10-CM   1. OSA, previously intolerant to CPAP.  Worsening daytime hypersomnolence.  Wishes to try CPAP therapy again G47.33 Home sleep test  2. Chronic asthmatic bronchitis with increased  chronic dyspnea J44.9 Pulmonary Function Test [facility] Only  3. Former smoker Z87.891 Pulmonary Function Test [facility] Only  4. Moderate obesity E66.8     PLAN:  We will need to repeat sleep study to requalify her for CPAP.  Hopefully this can be done as a home sleep study. Continue Anoro and Flovent inhalers Continue albuterol inhaler as needed I confirmed that she remains abstinent from smoking We discussed potential benefits of weight loss with regard to her overall health and with regard to the problem of sleep apnea  Follow-up in 6 weeks with sleep study and PFTs prior to that visit   Lorraine Border, MD PCCM service Mobile [phone] Pager [phone] 12/01/2018 10:34 AM

## 2018-12-02 ENCOUNTER — Other Ambulatory Visit: Payer: Self-pay | Admitting: Internal Medicine

## 2018-12-02 MED ORDER — BACLOFEN 10 MG PO TABS: 10.0000 mg | ORAL_TABLET | Freq: Three times a day (TID) | ORAL | 0 refills | Status: DC

## 2018-12-03 ENCOUNTER — Telehealth: Payer: Self-pay | Admitting: Cardiovascular Disease

## 2018-12-03 DIAGNOSIS — E782 Mixed hyperlipidemia: Secondary | ICD-10-CM

## 2018-12-03 DIAGNOSIS — I25118 Atherosclerotic heart disease of native coronary artery with other forms of angina pectoris: Secondary | ICD-10-CM

## 2018-12-03 NOTE — Telephone Encounter (Signed)
Left a message for the patient to call back to verify when she discontinued the Zetia.

## 2018-12-03 NOTE — Telephone Encounter (Signed)
Pt c/o medication issue:  1. Name of Medication: ezetimibe (ZETIA)   2. How are you currently taking this medication (dosage and times per day)? 10 MG 1 tablet daily   3. Are you having a reaction (difficulty breathing--STAT)?   4. What is your medication issue? Patient pharmacy calling.  Patient at pharmacy to pick up medication but would like to confirm if she should still be taking.  Please advise.

## 2018-12-03 NOTE — Telephone Encounter (Signed)
I spoke with pharmacist to confirm pt should be taking repatha and zetia. Pharmacist made aware that we have not stopped zetia or repatha.  Pt proceeded to mention that she was told to stop the zetia but doesn't recall if Dr.[name] told her or PCP. She was made aware that Dr.[name] didn't stop her zetia per last ov.  Then she proceeded to say that her PCP told her to stop her zetia.  Please advise pt If she should continue taking zetia 10 mg tablet qd. We are unaware if pcp stopped her zetia bc pcp is not in epic or careeverywhere if that change was made by pcp.

## 2018-12-06 ENCOUNTER — Telehealth: Payer: Self-pay | Admitting: Pulmonary Disease

## 2018-12-06 NOTE — Telephone Encounter (Signed)
Called and spoke [name] with [facility], who stated that they have received chart notes from 12/01/2018. Per [name] nothing further is needed at this time.

## 2018-12-07 ENCOUNTER — Other Ambulatory Visit (HOSPITAL_COMMUNITY): Payer: Self-pay | Admitting: Podiatry

## 2018-12-07 DIAGNOSIS — I739 Peripheral vascular disease, unspecified: Secondary | ICD-10-CM

## 2018-12-07 DIAGNOSIS — Z9889 Other specified postprocedural states: Secondary | ICD-10-CM

## 2018-12-08 NOTE — Telephone Encounter (Signed)
Routing to PharmD for advice.

## 2018-12-08 NOTE — Telephone Encounter (Signed)
Patient states she stopped Zetia back near the end of February when she started the Repatha injections. Her PCP is in [place] and lab work in March did not include Lipid panel. Patient is agreeable to go to [place] next week for repeat lab work. She knows to be fasting and that she will need to wear a mask.  Lipid order entered.

## 2018-12-08 NOTE — Telephone Encounter (Signed)
Repatha was started in Feb 2020 when LDL was 79 above goal < 70 on Zetia monotherapy. We do not have any updated lab work since this time - would recommend contacting PCP office to see if they drew a recent lipid panel and that is why they advised pt to stop Zetia. LDL should have dropped well below goal < 70 on Repatha, in which case it would be reasonable to stop Zetia and just continue on Repatha. Would just need to confirm that f/u lipid panel was drawn at PCP and LDL was less than ~50 or so on both meds (to ensure LDL would stay at goal after Zetia d/c).

## 2018-12-14 ENCOUNTER — Other Ambulatory Visit
Admission: RE | Admit: 2018-12-14 | Discharge: 2018-12-14 | Disposition: A | Discharge status: Home / Self Care | Payer: Medicare HMO | Attending: Cardiovascular Disease | Admitting: Cardiovascular Disease

## 2018-12-14 ENCOUNTER — Other Ambulatory Visit: Payer: Self-pay

## 2018-12-14 DIAGNOSIS — E782 Mixed hyperlipidemia: Secondary | ICD-10-CM | POA: Insufficient documentation

## 2018-12-14 DIAGNOSIS — I25118 Atherosclerotic heart disease of native coronary artery with other forms of angina pectoris: Secondary | ICD-10-CM | POA: Diagnosis not present

## 2018-12-14 DIAGNOSIS — E119 Type 2 diabetes mellitus without complications: Secondary | ICD-10-CM | POA: Diagnosis not present

## 2018-12-14 DIAGNOSIS — G4733 Obstructive sleep apnea (adult) (pediatric): Secondary | ICD-10-CM | POA: Diagnosis not present

## 2018-12-14 LAB — LIPID PANEL
Cholesterol: 149 mg/dL (ref 0–200)
HDL: 51 mg/dL (ref >40)
LDL Cholesterol: 81 mg/dL (ref 0–99)
Total CHOL/HDL Ratio: 2.9 RATIO
Triglycerides: 87 mg/dL (ref <150)
VLDL: 17 mg/dL (ref 0–40)

## 2018-12-21 ENCOUNTER — Other Ambulatory Visit: Payer: Self-pay | Admitting: Internal Medicine

## 2018-12-21 DIAGNOSIS — G8929 Other chronic pain: Secondary | ICD-10-CM | POA: Diagnosis not present

## 2018-12-21 DIAGNOSIS — J449 Chronic obstructive pulmonary disease, unspecified: Secondary | ICD-10-CM

## 2018-12-21 DIAGNOSIS — M25561 Pain in right knee: Secondary | ICD-10-CM | POA: Diagnosis not present

## 2018-12-21 DIAGNOSIS — M1711 Unilateral primary osteoarthritis, right knee: Secondary | ICD-10-CM | POA: Diagnosis not present

## 2018-12-21 MED ORDER — UMECLIDINIUM-VILANTEROL 62.5-25 MCG/INH IN AEPB: 1.0000 | INHALATION_SPRAY | Freq: Every day | RESPIRATORY_TRACT | 12 refills | Status: DC

## 2018-12-24 ENCOUNTER — Telehealth: Payer: Self-pay | Admitting: Cardiovascular Disease

## 2018-12-24 NOTE — Telephone Encounter (Signed)
I spoke with the patient regarding her lipid panel results.  She confirms she is on Repatha 140 mg injections every 14 days. She is also on crestor 20 mg once daily.  The patient states she stopped her zetia back in February at the recommendation of her PCP.  I have advised her I will notify Dr. [name] that this is the case. She is aware she will most likely need to go back on the zetia 10 mg once daily.  She said if she needs to do this, please send it in to the [facility] on [address]   She is aware we will call her back to confirm if she is to restart Zetia.

## 2018-12-24 NOTE — Telephone Encounter (Signed)
Notes recorded by Minna Merritts, MD on 12/23/2018 at 9:01 PM EDT Lipids, LDL above goal, preferably need LDL 60, currently 80 Can we see if she received repatha?  Was on crestor zetia at time of the labs thx

## 2018-12-26 NOTE — Telephone Encounter (Signed)
Would restart zetia 10 daily

## 2018-12-27 DIAGNOSIS — R69 Illness, unspecified: Secondary | ICD-10-CM | POA: Diagnosis not present

## 2018-12-28 ENCOUNTER — Other Ambulatory Visit: Payer: Self-pay | Admitting: Neurosurgery

## 2018-12-28 DIAGNOSIS — E236 Other disorders of pituitary gland: Secondary | ICD-10-CM

## 2018-12-29 DIAGNOSIS — M25561 Pain in right knee: Secondary | ICD-10-CM | POA: Diagnosis not present

## 2018-12-29 DIAGNOSIS — M1711 Unilateral primary osteoarthritis, right knee: Secondary | ICD-10-CM | POA: Diagnosis not present

## 2018-12-29 DIAGNOSIS — G8929 Other chronic pain: Secondary | ICD-10-CM | POA: Diagnosis not present

## 2018-12-29 DIAGNOSIS — E119 Type 2 diabetes mellitus without complications: Secondary | ICD-10-CM | POA: Diagnosis not present

## 2018-12-29 MED ORDER — EZETIMIBE 10 MG PO TABS: 10.0000 mg | ORAL_TABLET | Freq: Every day | ORAL | 3 refills | Status: DC

## 2018-12-29 NOTE — Telephone Encounter (Signed)
Spoke with patient and reviewed provider recommendations to continue zetia. Requested refill to be sent to pharmacy and she also mentioned that she may need clearance for possible surgery. Advised that typically the surgeon will send [place] a clearance request and then we can determine if she needs another appointment. She verbalized understanding of our conversation, agreement with plan, and had no further questions at this time.

## 2018-12-31 ENCOUNTER — Telehealth: Payer: Self-pay | Admitting: *Deleted

## 2018-12-31 ENCOUNTER — Other Ambulatory Visit: Payer: Self-pay

## 2018-12-31 NOTE — Telephone Encounter (Signed)
Copied from [place] [id number]. Topic: General - Inquiry >> Dec 31, 2018  9:24 AM Richardo Priest, NT wrote: Reason for CRM: Wyatt Portela from [facility] calling in to requesting office notes from 12/14/2018. Fax number is [phone].

## 2018-12-31 NOTE — Telephone Encounter (Signed)
The notes are for Dr [name] office.

## 2019-01-03 NOTE — Telephone Encounter (Signed)
[facility] called back and stated they just need any office note prior to 5/19 just stating that pt diabetic     Fax number [phone]

## 2019-01-04 NOTE — Telephone Encounter (Signed)
Note sent electronically

## 2019-01-05 ENCOUNTER — Other Ambulatory Visit: Payer: Self-pay | Admitting: Internal Medicine

## 2019-01-05 MED ORDER — ESOMEPRAZOLE MAGNESIUM 40 MG PO CPDR: 40.0000 mg | DELAYED_RELEASE_CAPSULE | Freq: Every day | ORAL | 1 refills | Status: DC

## 2019-01-05 NOTE — Telephone Encounter (Signed)
Copied from [place] [id number] [id number]. Topic: Quick Communication - Rx Refill/Question >> Jan 05, 2019 10:27 AM Celene Kras A wrote: Medication: esomeprazole (NEXIUM) 40 MG capsule  Has the patient contacted their pharmacy? No. (Agent: If no, request that the patient contact the pharmacy for the refill.) (Agent: If yes, when and what did the pharmacy advise?)  Preferred Pharmacy (with phone number or street name): [facility] (N), [place] [facility] [place] ([address] [address] Phone: [phone] Fax: [phone] Not a 24 hour pharmacy; exact hours not known.    Agent: Please be advised that RX refills may take up to 3 business days. We ask that you follow-up with your pharmacy.

## 2019-01-06 ENCOUNTER — Other Ambulatory Visit: Payer: Self-pay

## 2019-01-06 ENCOUNTER — Other Ambulatory Visit
Admission: RE | Admit: 2019-01-06 | Discharge: 2019-01-06 | Disposition: A | Discharge status: Home / Self Care | Payer: Medicare HMO | Source: Ambulatory Visit | Attending: Cardiovascular Disease | Admitting: Cardiovascular Disease

## 2019-01-06 DIAGNOSIS — Z1159 Encounter for screening for other viral diseases: Secondary | ICD-10-CM | POA: Insufficient documentation

## 2019-01-07 LAB — NOVEL CORONAVIRUS, NAA (HOSP ORDER, SEND-OUT TO REF LAB; TAT 18-24 HRS): SARS-CoV-2, NAA: NOT DETECTED

## 2019-01-10 ENCOUNTER — Ambulatory Visit (HOSPITAL_COMMUNITY): Payer: Medicare HMO

## 2019-01-10 ENCOUNTER — Other Ambulatory Visit: Payer: Self-pay

## 2019-01-10 DIAGNOSIS — J449 Chronic obstructive pulmonary disease, unspecified: Secondary | ICD-10-CM

## 2019-01-10 DIAGNOSIS — Z87891 Personal history of nicotine dependence: Secondary | ICD-10-CM | POA: Diagnosis not present

## 2019-01-10 DIAGNOSIS — M5412 Radiculopathy, cervical region: Secondary | ICD-10-CM | POA: Diagnosis present

## 2019-01-10 DIAGNOSIS — E236 Other disorders of pituitary gland: Secondary | ICD-10-CM | POA: Diagnosis not present

## 2019-01-11 ENCOUNTER — Ambulatory Visit
Admission: RE | Admit: 2019-01-11 | Discharge: 2019-01-11 | Disposition: A | Discharge status: Home / Self Care | Payer: Medicare HMO | Source: Ambulatory Visit | Attending: Neurosurgery | Admitting: Neurosurgery

## 2019-01-11 ENCOUNTER — Other Ambulatory Visit: Payer: Self-pay

## 2019-01-11 ENCOUNTER — Ambulatory Visit: Payer: Medicare HMO

## 2019-01-11 DIAGNOSIS — E236 Other disorders of pituitary gland: Secondary | ICD-10-CM | POA: Insufficient documentation

## 2019-01-11 DIAGNOSIS — M5412 Radiculopathy, cervical region: Secondary | ICD-10-CM | POA: Insufficient documentation

## 2019-01-11 DIAGNOSIS — G4733 Obstructive sleep apnea (adult) (pediatric): Secondary | ICD-10-CM

## 2019-01-11 DIAGNOSIS — J449 Chronic obstructive pulmonary disease, unspecified: Secondary | ICD-10-CM | POA: Insufficient documentation

## 2019-01-11 DIAGNOSIS — Z87891 Personal history of nicotine dependence: Secondary | ICD-10-CM | POA: Insufficient documentation

## 2019-01-11 LAB — POCT I-STAT CREATININE: Creatinine, Ser: 0.9 mg/dL (ref 0.44–1.00)

## 2019-01-11 MED ORDER — GADOBUTROL 1 MMOL/ML IV SOLN
9.0000 mL | Freq: Once | INTRAVENOUS | Status: AC | PRN
  Administered 2019-01-11: 9 mL via INTRAVENOUS

## 2019-01-14 ENCOUNTER — Telehealth: Payer: Self-pay | Admitting: Pulmonary Disease

## 2019-01-14 DIAGNOSIS — G4733 Obstructive sleep apnea (adult) (pediatric): Secondary | ICD-10-CM

## 2019-01-14 NOTE — Telephone Encounter (Signed)
HST performed on 01/11/2019 confirmed moderate OSA with AHI of 27. Recommend auto cpap 5-20cm h2O. Left message for pt.

## 2019-01-15 ENCOUNTER — Telehealth: Payer: Self-pay | Admitting: Cardiology

## 2019-01-15 NOTE — Telephone Encounter (Signed)
Received call from the patient that she lost her [facility] which she normally keeps in her pocket book. Reports that she has been doing well, on a rare occasion will have chest pain but cannot recall the last time she needed to use the [facility]. No escalation of symptoms or change in symptoms. Have called in prescription for refill of SLNG to patient's pharmacy.  Bryna Colander, MD

## 2019-01-17 NOTE — Telephone Encounter (Signed)
Pt is aware of results and wished to proceed with cpap. Order has been placed. Pt has been scheduled for f/u 03/28/2019. Nothing further is needed.

## 2019-01-17 NOTE — Telephone Encounter (Signed)
Pt is returning call. Cb is [phone]

## 2019-01-18 ENCOUNTER — Other Ambulatory Visit: Payer: Self-pay | Admitting: Orthopedic Surgery

## 2019-01-18 ENCOUNTER — Ambulatory Visit: Payer: Medicare HMO

## 2019-01-18 DIAGNOSIS — G8929 Other chronic pain: Secondary | ICD-10-CM

## 2019-01-19 DIAGNOSIS — M1711 Unilateral primary osteoarthritis, right knee: Secondary | ICD-10-CM | POA: Diagnosis not present

## 2019-01-21 ENCOUNTER — Ambulatory Visit (INDEPENDENT_AMBULATORY_CARE_PROVIDER_SITE_OTHER): Payer: Medicare HMO | Admitting: Internal Medicine

## 2019-01-21 ENCOUNTER — Other Ambulatory Visit: Payer: Self-pay

## 2019-01-21 ENCOUNTER — Ambulatory Visit
Admission: RE | Admit: 2019-01-21 | Discharge: 2019-01-21 | Disposition: A | Discharge status: Home / Self Care | Payer: Medicare HMO | Source: Ambulatory Visit | Attending: Orthopedic Surgery | Admitting: Orthopedic Surgery

## 2019-01-21 ENCOUNTER — Ambulatory Visit: Payer: Medicare HMO | Admitting: Internal Medicine

## 2019-01-21 ENCOUNTER — Telehealth: Payer: Self-pay | Admitting: Internal Medicine

## 2019-01-21 DIAGNOSIS — E119 Type 2 diabetes mellitus without complications: Secondary | ICD-10-CM | POA: Diagnosis not present

## 2019-01-21 DIAGNOSIS — M25561 Pain in right knee: Secondary | ICD-10-CM | POA: Insufficient documentation

## 2019-01-21 DIAGNOSIS — R32 Unspecified urinary incontinence: Secondary | ICD-10-CM

## 2019-01-21 DIAGNOSIS — M1711 Unilateral primary osteoarthritis, right knee: Secondary | ICD-10-CM | POA: Diagnosis not present

## 2019-01-21 DIAGNOSIS — G8929 Other chronic pain: Secondary | ICD-10-CM | POA: Diagnosis not present

## 2019-01-21 DIAGNOSIS — M7121 Synovial cyst of popliteal space [Baker], right knee: Secondary | ICD-10-CM | POA: Diagnosis not present

## 2019-01-21 DIAGNOSIS — M25461 Effusion, right knee: Secondary | ICD-10-CM | POA: Diagnosis not present

## 2019-01-21 DIAGNOSIS — Z1329 Encounter for screening for other suspected endocrine disorder: Secondary | ICD-10-CM

## 2019-01-21 MED ORDER — MIRABEGRON ER 25 MG PO TB24: 25.0000 mg | ORAL_TABLET | Freq: Every day | ORAL | 11 refills | Status: DC

## 2019-01-21 NOTE — Progress Notes (Signed)
Virtual Visit via Video Note  I connected with [name]  on 01/21/19 at  1:10 PM EDT by a video enabled telemedicine application and verified that I am speaking with the correct person using two identifiers.  Location patient: home Location provider:work Persons participating in the virtual visit: patient, provider  I discussed the limitations of evaluation and management by telemedicine and the availability of in person appointments. The patient expressed understanding and agreed to proceed.   HPI: C/o worsening urinary incontinence over the last 2 years kegels is not helping she has urgency and cant make it to the bathroom and messing up depends. Nothing tried but she reports not drinking tea or sodas and water not drinking in excess   ROS: See pertinent positives and negatives per HPI.  Past Medical History:  Diagnosis Date  . (HFpEF) heart failure with preserved ejection fraction ([place])    a. 06/2016 Echo: EF 50%, no rwma, mild to mod TR.  [name] Allergy   . Anemia   . Arthritis   . Asthma   . Bacterial vaginitis   . Blood in stool   . Brachial neuritis or radiculitis NOS   . 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 being followed by neurosurgery/eye doctor and pcp. monitoring size.causes sinus problems  . Bronchitis   . Cardiac arrhythmia   . Cervical neck pain with evidence of disc disease    C5/6 disease, MRI done late 2012 - no records available  . Chronic pain   . Cocaine abuse, in remission ([place])    clean x 24 years  . Colon polyps   . COPD (chronic obstructive pulmonary disease) (HCC)    many inhalers  . Coronary artery disease    a. PCI of LCX 2003; b. PCI of the LAD 2012 with a (2.5 x 8 mm BMS);  c.s/p CABG 4/12:  L-LAD, S-Dx, S-OM, S-RCA (Dr. Prescott Gum);  d. 01/2012 MV: inf infarct, attenuation, no ischemia.  . Depression   . Diabetes mellitus without complication ([place])   . Family history of colon cancer   . Generalized headaches    frequent  .  GERD (gastroesophageal reflux disease)   . Headache   . Heart murmur   . Hematochezia   . Hepatitis    history of hepatitis b  . History of cervical cancer    s/p cryotherapy  . History of drug abuse ([place])    cocaine, marijuana, clean since 1989  . History of hepatitis B    from eating undercooked liver  . History of MI (myocardial infarction)   . Hyperlipidemia   . Hypertension   . Myocardial infarction [facility]) 2003, 2012  . PAD (peripheral artery disease) (HCC)    s/p Right SFA atherectomy and PTA 01/15/11, normal ABIs 01/2011  . Polyp of colon   . Seasonal allergies   . Sleep apnea    uses cpap  . Smoking history    quit 07/2010  . Systolic CHF, chronic (HCC)    mild: echo 08/2010 - mildly reduced EF 40-45%, mild diffuse hypokinesis  . Thyroid disease   . Urinary incontinence   . Urine incontinence     Past Surgical History:  Procedure Laterality Date  . ABDOMINAL HYSTERECTOMY     2003  . CHOLECYSTECTOMY  1986  . COLONOSCOPY  2008   3 polyps  . COLONOSCOPY WITH PROPOFOL N/A 02/06/2015   Procedure: COLONOSCOPY WITH PROPOFOL;  Surgeon: Lucilla Lame, MD;  Location: [facility] ENDOSCOPY;  Service: Endoscopy;  Laterality: N/A;  . COLONOSCOPY WITH PROPOFOL N/A 01/27/2017   Procedure: COLONOSCOPY WITH PROPOFOL;  Surgeon: Lucilla Lame, MD;  Location: [facility] ENDOSCOPY;  Service: Endoscopy;  Laterality: N/A;  . CORONARY ANGIOPLASTY     w/ stent placement x2  . CORONARY ARTERY BYPASS GRAFT  2012   Dr [name]  . CORONARY STENT PLACEMENT  2003   S/P MI  . CORONARY STENT PLACEMENT  2007   [place]  . ESOPHAGOGASTRODUODENOSCOPY (EGD) WITH PROPOFOL N/A 02/06/2015   Procedure: ESOPHAGOGASTRODUODENOSCOPY (EGD) WITH PROPOFOL;  Surgeon: Lucilla Lame, MD;  Location: [facility] ENDOSCOPY;  Service: Endoscopy;  Laterality: N/A;  . ESOPHAGOGASTRODUODENOSCOPY (EGD) WITH PROPOFOL N/A 01/27/2017   Procedure: ESOPHAGOGASTRODUODENOSCOPY (EGD) WITH PROPOFOL;  Surgeon: Lucilla Lame, MD;  Location: [facility] ENDOSCOPY;   Service: Endoscopy;  Laterality: N/A;  . FEMORAL ARTERY STENT  10/2010   right sided (Dr. Burt Knack)  . KNEE ARTHROSCOPY WITH MEDIAL MENISECTOMY Right 08/20/2017   Procedure: KNEE ARTHROSCOPY WITH MEDIAL  AND LATERAL MENISECTOMY;  Surgeon: Hessie Knows, MD;  Location: [facility] ORS;  Service: Orthopedics;  Laterality: Right;  . POSTERIOR CERVICAL LAMINECTOMY N/A 03/08/2018   Procedure: POSTERIOR CERVICAL LAMINECTOMY-C7;  Surgeon: Deetta Perla, MD;  Location: [facility] ORS;  Service: Neurosurgery;  Laterality: N/A;  . TUBAL LIGATION      Family History  Problem Relation Age of Onset  . Hypertension Father   . Heart failure Father   . Diabetes Father   . Colon cancer Father 76  . Glaucoma Father   . Cancer Father        colorectal   . Heart disease Father   . Other Father        glaucoma  . Breast cancer Mother 26       breast cancer, late 16's  . Cancer Mother        breast  . Brain cancer Sister   . Arthritis Sister   . Diabetes Sister   . Hypertension Sister   . Kidney disease Sister   . Diabetes Brother   . Hypertension Brother   . Other Sister        brain tumor   . Coronary artery disease Neg Hx   . Stroke Neg Hx     SOCIAL HX: lives with family of note sister Marcelline Deist   Current Outpatient Medications:  .  albuterol (PROVENTIL HFA;VENTOLIN HFA) 108 (90 Base) MCG/ACT inhaler, Inhale 2 puffs into the lungs every 6 (six) hours as needed for wheezing or shortness of breath., Disp: 1 Inhaler, Rfl: 3 .  aspirin 81 MG tablet, Take 1 tablet (81 mg total) by mouth daily., Disp: 90 tablet, Rfl: 3 .  Azelastine HCl 0.15 % SOLN, Place 2 sprays into both nostrils daily as needed (allergies). , Disp: , Rfl:  .  baclofen (LIORESAL) 10 MG tablet, Take 1 tablet (10 mg total) by mouth 3 (three) times daily., Disp: 30 each, Rfl: 0 .  esomeprazole (NEXIUM) 40 MG capsule, Take 1 capsule (40 mg total) by mouth daily. Reported on 09/04/2015, Disp: 90 capsule, Rfl: 1 .  Evolocumab (REPATHA SURECLICK)  878 MG/ML SOAJ, Inject 140 mg into the skin every 14 (fourteen) days., Disp: 2 pen, Rfl: 11 .  ezetimibe (ZETIA) 10 MG tablet, Take 1 tablet (10 mg total) by mouth daily., Disp: 90 tablet, Rfl: 3 .  fluticasone (FLONASE) 50 MCG/ACT nasal spray, Place 2 sprays into both nostrils daily as needed for allergies. , Disp: , Rfl:  .  fluticasone (FLOVENT HFA)  110 MCG/ACT inhaler, Inhale 1 puff into the lungs 2 (two) times a day. Rinse mouth, Disp: 1 Inhaler, Rfl: 12 .  furosemide (LASIX) 20 MG tablet, TAKE 1 TABLET BY MOUTH ONCE DAILY AND A SECOND TABLET IF NEEDED FOR FLUID BUILD UP., Disp: 30 tablet, Rfl: 3 .  gabapentin (NEURONTIN) 300 MG capsule, Take 300 mg by mouth. TID, Disp: , Rfl:  .  glucose blood (ONE TOUCH ULTRA TEST) test strip, Use as instructed check cbg qd E11.9, Disp: 100 each, Rfl: 12 .  HYDROcodone-acetaminophen (NORCO/VICODIN) 5-325 MG tablet, Take by mouth., Disp: , Rfl:  .  ipratropium-albuterol (DUONEB) 0.5-2.5 (3) MG/3ML SOLN, Take 3 mLs by nebulization 2 (two) times daily as needed., Disp: 360 mL, Rfl: 12 .  isosorbide mononitrate (IMDUR) 30 MG 24 hr tablet, TAKE 1 TABLET BY MOUTH TWICE DAILY . APPOINTMENT REQUIRED FOR FUTURE REFILLS, Disp: 60 tablet, Rfl: 5 .  Lactobacillus (ACIDOPHILUS PROBIOTIC) 10 MG CAPS, Take 1 capsule by mouth daily., Disp: 30 capsule, Rfl: 2 .  Lancets MISC, 1 Device by Does not apply route daily. Lancets E11.9, Disp: 90 each, Rfl: 3 .  levocetirizine (XYZAL) 5 MG tablet, Take 1 tablet (5 mg total) by mouth at bedtime as needed for allergies., Disp: 90 tablet, Rfl: 3 .  meclizine (ANTIVERT) 25 MG tablet, Take 25 mg by mouth 3 (three) times daily. , Disp: , Rfl: 3 .  metFORMIN (GLUCOPHAGE-XR) 500 MG 24 hr tablet, Take 1 tablet (500 mg total) by mouth daily with breakfast., Disp: 90 tablet, Rfl: 3 .  methocarbamol (ROBAXIN) 500 MG tablet, 500 mg. QID, Disp: , Rfl:  .  metoprolol tartrate (LOPRESSOR) 25 MG tablet, Take 1 tablet (25 mg total) by mouth 2 (two)  times daily., Disp: 180 tablet, Rfl: 3 .  mupirocin ointment (BACTROBAN) 2 %, Apply 1 application topically 2 (two) times daily. Prn scalp x 1 week and nose x 5 days, Disp: 30 g, Rfl: 0 .  nitroGLYCERIN (NITROSTAT) 0.4 MG SL tablet, Place 1 tablet (0.4 mg total) under the tongue every 5 (five) minutes as needed. (Patient taking differently: Place 0.4 mg under the tongue every 5 (five) minutes as needed for chest pain. ), Disp: 25 tablet, Rfl: 6 .  PARoxetine (PAXIL) 10 MG tablet, Take 1 tablet (10 mg total) by mouth daily., Disp: 30 tablet, Rfl: 11 .  potassium chloride (K-DUR,KLOR-CON) 10 MEQ tablet, Take 1 tablet (10 mEq total) by mouth daily., Disp: 90 tablet, Rfl: 2 .  rivaroxaban (XARELTO) 2.5 MG TABS tablet, Take 1 tablet (2.5 mg total) by mouth 2 (two) times daily., Disp: 60 tablet, Rfl: 0 .  rosuvastatin (CRESTOR) 20 MG tablet, Take 1 tablet (20 mg total) by mouth at bedtime., Disp: 90 tablet, Rfl: 3 .  senna-docusate (SENOKOT-S) 8.6-50 MG tablet, Take 1 tablet by mouth daily as needed for mild constipation or moderate constipation., Disp: 30 tablet, Rfl: 12 .  traMADol (ULTRAM) 50 MG tablet, Take 50 mg by mouth every 6 (six) hours as needed., Disp: , Rfl:  .  umeclidinium-vilanterol (ANORO ELLIPTA) 62.5-25 MCG/INH AEPB, Inhale 1 puff into the lungs daily., Disp: 60 each, Rfl: 12 .  Wheat Dextrin (BENEFIBER) POWD, 2 teaspoons added to 4-8 ounces of liquid or food up to 3x per day prn, Disp: 475 g, Rfl: 11 .  losartan (COZAAR) 25 MG tablet, Take 1 tablet (25 mg total) by mouth daily., Disp: 90 tablet, Rfl: 3 .  mirabegron ER (MYRBETRIQ) 25 MG TB24 tablet, Take 1 tablet (  25 mg total) by mouth daily., Disp: 30 tablet, Rfl: 11  EXAM:  VITALS per patient if applicable:  GENERAL: alert, oriented, appears well and in no acute distress  HEENT: atraumatic, conjunttiva clear, no obvious abnormalities on inspection of external nose and ears  NECK: normal movements of the head and neck  LUNGS: on  inspection no signs of respiratory distress, breathing rate appears normal, no obvious gross SOB, gasping or wheezing  CV: no obvious cyanosis  MS: moves all visible extremities without noticeable abnormality  PSYCH/NEURO: pleasant and cooperative, no obvious depression or anxiety, speech and thought processing grossly intact  ASSESSMENT AND PLAN:  Discussed the following assessment and plan:  Urinary incontinence, unspecified type - Plan: mirabegron ER (MYRBETRIQ) 25 MG TB24 tablet, Urinalysis, Routine w reflex microscopic, Urine Culture -failed kegels will not do oxybutynin already has blurry vision with pituitary mass   Type 2 diabetes mellitus without complication, without long-term current use of insulin ([place]) - Plan: Comprehensive metabolic panel, Hemoglobin A1c -last A1C 6.8 04/15/18  -cont meds   Hm Had flu shotutd -will need Tdap and, shingrix in future  pna 21 had 03/10/16  S/p hysterectomy no cervix and f/u OB/GYNh/o abnormal pap s/p cryo  -pap 05/08/15 neg papwill Repeat In 5 years   Colonoscopy Dr. Allen Norris 01/2017 polyps, gastritis, diverticulosis  Mammogram 10/13/2018 negative   DEXA neg 10/13/2018   CT chest had 06/16/17-former smoker  IMPRESSION: 1. Cardiomegaly with left main and three-vessel coronary arteriosclerosis. Status post CABG. 2. No acute pulmonary embolus, aortic aneurysm or dissection. 3. Bibasilar atelectasis. Trace bilateral pleural effusions. 4. No acute osseous abnormality.  Aortic Atherosclerosis (ICD10-I70.0).  Had thyroid labs 03/25/17 and 01/27/2018 care everywhere normal0.68 free T4 (01/27/18) and 0.77 and TSH 1.481 (03/25/17)  I discussed the assessment and treatment plan with the patient. The patient was provided an opportunity to ask questions and all were answered. The patient agreed with the plan and demonstrated an understanding of the instructions.  The patient was advised to call back or seek an in-person evaluation if  the symptoms worsen or if the condition fails to improve as anticipated.     I discussed the assessment and treatment plan with the patient. The patient was provided an opportunity to ask questions and all were answered. The patient agreed with the plan and demonstrated an understanding of the instructions.   The patient was advised to call back or seek an in-person evaluation if the symptoms worsen or if the condition fails to improve as anticipated.  Time spent 15 minutes Delorise Jackson, MD

## 2019-01-21 NOTE — Patient Instructions (Signed)
Mirabegron extended-release tablets What is this medicine? MIRABEGRON (MIR a BEG ron) is used to treat overactive bladder. This medicine reduces the amount of bathroom visits. It may also help to control wetting accidents. It may be used alone, but sometimes may be given with other treatments. This medicine may be used for other purposes; ask your health care provider or pharmacist if you have questions. COMMON BRAND NAME(S): Myrbetriq What should I tell my health care provider before I take this medicine? They need to know if you have any of these conditions: -high blood pressure -kidney disease -liver disease -problems urinating -prostate disease -an unusual or allergic reaction to mirabegron, other medicines, foods, dyes, or preservatives -pregnant or trying to get pregnant -breast-feeding How should I use this medicine? Take this medicine by mouth with a glass of water. Follow the directions on the prescription label. Do not cut, crush or chew this medicine. You can take it with or without food. If it upsets your stomach, take it with food. Take your medicine at regular intervals. Do not take it more often than directed. Do not stop taking except on your doctor's advice. Talk to your pediatrician regarding the use of this medicine in children. Special care may be needed. Overdosage: If you think you have taken too much of this medicine contact a poison control center or emergency room at once. NOTE: This medicine is only for you. Do not share this medicine with others. What if I miss a dose? If you miss a dose, take it as soon as you can. If it is almost time for your next dose, take only that dose. Do not take double or extra doses. What may interact with this medicine? -codeine -desipramine -digoxin -flecainide -MAOIs like Carbex, Eldepryl, Marplan, Nardil, and Parnate -methadone -metoprolol -pimozide -propafenone -thioridazine -warfarin This list may not describe all possible  interactions. Give your health care provider a list of all the medicines, herbs, non-prescription drugs, or dietary supplements you use. Also tell them if you smoke, drink alcohol, or use illegal drugs. Some items may interact with your medicine. What should I watch for while using this medicine? Visit your doctor or health care professional for regular checks on your progress. Check your blood pressure as directed. Ask your doctor or health care professional what your blood pressure should be and when you should contact him or her. You may need to limit your intake of tea, coffee, caffeinated sodas, or alcohol. These drinks may make your symptoms worse. What side effects may I notice from receiving this medicine? Side effects that you should report to your doctor or health care professional as soon as possible: -allergic reactions like skin rash, itching or hives, swelling of the face, lips, or tongue -high blood pressure -fast, irregular heartbeat -redness, blistering, peeling or loosening of the skin, including inside the mouth -signs of infection like fever or chills; pain or difficulty passing urine -trouble passing urine or change in the amount of urine Side effects that usually do not require medical attention (report to your doctor or health care professional if they continue or are bothersome): -constipation -dry mouth -headache -runny nose -stomach upset This list may not describe all possible side effects. Call your doctor for medical advice about side effects. You may report side effects to FDA at 1-800-FDA-1088. Where should I keep my medicine? Keep out of the reach of children. Store at room temperature between 15 and 30 degrees C (59 and 86 degrees F). Throw away any unused  medicine after the expiration date. NOTE: This sheet is a summary. It may not cover all possible information. If you have questions about this medicine, talk to your doctor, pharmacist, or health care  provider.  2019 Elsevier/Gold Standard (2016-12-04 11:33:21)  Urinary Incontinence  Urinary incontinence refers to a condition in which a person is unable to control where and when to pass urine. A person with this condition will urinate when he or she does not mean to (involuntarily). What are the causes? This condition may be caused by:  Medicines.  Infections.  Constipation.  Overactive bladder muscles.  Weak bladder muscles.  Weak pelvic floor muscles. These muscles provide support for the bladder, intestine, and, in women, the uterus.  Enlarged prostate in men. The prostate is a gland near the bladder. When it gets too big, it can pinch the urethra. With the urethra blocked, the bladder can weaken and lose the ability to empty properly.  Surgery.  Emotional factors, such as anxiety, stress, or post-traumatic stress disorder (PTSD).  Pelvic organ prolapse. This happens in women when organs shift out of place and into the vagina. This shift can prevent the bladder and urethra from working properly. What increases the risk? The following factors may make you more likely to develop this condition:  Older age.  Obesity and physical inactivity.  Pregnancy and childbirth.  Menopause.  Diseases that affect the nerves or spinal cord (neurological diseases).  Long-term (chronic) coughing. This can increase pressure on the bladder and pelvic floor muscles. What are the signs or symptoms? Symptoms may vary depending on the type of urinary incontinence you have. They include:  A sudden urge to urinate, but passing urine involuntarily before you can get to a bathroom (urge incontinence).  Suddenly passing urine with any activity that forces urine to pass, such as coughing, laughing, exercise, or sneezing (stress incontinence).  Needing to urinate often, but urinating only a small amount, or constantly dribbling urine (overflow incontinence).  Urinating because you cannot  get to the bathroom in time due to a physical disability, such as arthritis or injury, or communication and thinking problems, such as Alzheimer disease (functional incontinence). How is this diagnosed? This condition may be diagnosed based on:  Your medical history.  A physical exam.  Tests, such as: ? Urine tests. ? X-rays of your kidney and bladder. ? Ultrasound. ? CT scan. ? Cystoscopy. In this procedure, a health care provider inserts a tube with a light and camera (cystoscope) through the urethra and into the bladder in order to check for problems. ? Urodynamic testing. These tests assess how well the bladder, urethra, and sphincter can store and release urine. There are different types of urodynamic tests, and they vary depending on what the test is measuring. To help diagnose your condition, your health care provider may recommend that you keep a log of when you urinate and how much you urinate. How is this treated? Treatment for this condition depends on the type of incontinence that you have and its cause. Treatment may include:  Lifestyle changes, such as: ? Quitting smoking. ? Maintaining a healthy weight. ? Staying active. Try to get 150 minutes of moderate-intensity exercise every week. Ask your health care provider which activities are safe for you. ? Eating a healthy diet.  Avoid high-fat foods, like fried foods.  Avoid refined carbohydrates like white bread and white rice.  Limit how much alcohol and caffeine you drink.  Increase your fiber intake. Foods such as fresh fruits,  vegetables, beans, and whole grains are healthy sources of fiber.  Pelvic floor muscle exercises.  Bladder training, such as lengthening the amount of time between bathroom breaks, or using the bathroom at regular intervals.  Using techniques to suppress bladder urges. This can include distraction techniques or controlled breathing exercises.  Medicines to relax the bladder muscles and  prevent bladder spasms.  Medicines to help slow or prevent the growth of a man's prostate.  Botox injections. These can help relax the bladder muscles.  Using pulses of electricity to help change bladder reflexes (electrical nerve stimulation).  For women, using a medical device to prevent urine leaks. This is a small, tampon-like, disposable device that is inserted into the urethra.  Injecting collagen or carbon beads (bulking agents) into the urinary sphincter. These can help thicken tissue and close the bladder opening.  Surgery. Follow these instructions at home: Lifestyle  Limit alcohol and caffeine. These can fill your bladder quickly and irritate it.  Keep yourself clean to help prevent odors and skin damage. Ask your doctor about special skin creams and cleansers that can protect the skin from urine.  Consider wearing pads or adult diapers. Make sure to change them regularly, and always change them right after experiencing incontinence. General instructions  Take over-the-counter and prescription medicines only as told by your health care provider.  Use the bathroom about every 3-4 hours, even if you do not feel the need to urinate. Try to empty your bladder completely every time. After urinating, wait a minute. Then try to urinate again.  Make sure you are in a relaxed position while urinating.  If your incontinence is caused by nerve problems, keep a log of the medicines you take and the times you go to the bathroom.  Keep all follow-up visits as told by your health care provider. This is important. Contact a health care provider if:  You have pain that gets worse.  Your incontinence gets worse. Get help right away if:  You have a fever or chills.  You are unable to urinate.  You have redness in your groin area or down your legs. Summary  Urinary incontinence refers to a condition in which a person is unable to control where and when to pass urine.  This  condition may be caused by medicines, infection, weak bladder muscles, weak pelvic floor muscles, enlargement of the prostate (in men), or surgery.  The following factors increase your risk for developing this condition: older age, obesity, pregnancy and childbirth, menopause, neurological diseases, and chronic coughing.  There are several types of urinary incontinence. They include urge incontinence, stress incontinence, overflow incontinence, and functional incontinence.  This condition is usually treated first with lifestyle and behavioral changes, such as quitting smoking, eating a healthier diet, and doing regular pelvic floor exercises. Other treatment options include medicines, bulking agents, medical devices, electrical nerve stimulation, or surgery. This information is not intended to replace advice given to you by your health care provider. Make sure you discuss any questions you have with your health care provider. Document Released: 08/21/2004 Document Revised: 07/24/2017 Document Reviewed: 10/23/2016 Elsevier Patient Education  2020 [employer].

## 2019-01-21 NOTE — Telephone Encounter (Signed)
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 No compression of the chiasm. The mass abuts the right cavernous sinus without definite invasion.  Dr. Lacinda Axon are you worried about area of necrosis?

## 2019-01-24 ENCOUNTER — Ambulatory Visit: Payer: Medicare HMO | Admitting: Pulmonary Disease

## 2019-01-24 NOTE — Telephone Encounter (Signed)
We are more concerned with overall size rather than the necrosis but I can talk with [name] tomorrow in clinic about that. We would base any intervention off the overall growth and any impact on vision or hormone abnormalities

## 2019-01-24 NOTE — Telephone Encounter (Signed)
Thank you she is on aspirin and Xarelto and reports blurry vision since she has had this though eye exam is documented in Endocrines note Dr. Gabriel Carina   [place]

## 2019-01-25 DIAGNOSIS — E236 Other disorders of pituitary gland: Secondary | ICD-10-CM | POA: Diagnosis not present

## 2019-01-27 ENCOUNTER — Encounter: Payer: Self-pay | Admitting: Internal Medicine

## 2019-01-27 DIAGNOSIS — D352 Benign neoplasm of pituitary gland: Secondary | ICD-10-CM | POA: Diagnosis not present

## 2019-01-27 DIAGNOSIS — H43813 Vitreous degeneration, bilateral: Secondary | ICD-10-CM | POA: Diagnosis not present

## 2019-01-27 DIAGNOSIS — E119 Type 2 diabetes mellitus without complications: Secondary | ICD-10-CM | POA: Diagnosis not present

## 2019-01-27 LAB — HM DIABETES EYE EXAM

## 2019-01-30 ENCOUNTER — Other Ambulatory Visit: Payer: Self-pay | Admitting: Cardiovascular Disease

## 2019-01-30 DIAGNOSIS — R69 Illness, unspecified: Secondary | ICD-10-CM | POA: Diagnosis not present

## 2019-02-02 DIAGNOSIS — G4710 Hypersomnia, unspecified: Secondary | ICD-10-CM | POA: Diagnosis not present

## 2019-02-02 DIAGNOSIS — I259 Chronic ischemic heart disease, unspecified: Secondary | ICD-10-CM | POA: Diagnosis not present

## 2019-02-02 DIAGNOSIS — G4733 Obstructive sleep apnea (adult) (pediatric): Secondary | ICD-10-CM | POA: Diagnosis not present

## 2019-02-07 ENCOUNTER — Other Ambulatory Visit: Payer: Self-pay

## 2019-02-07 ENCOUNTER — Other Ambulatory Visit (INDEPENDENT_AMBULATORY_CARE_PROVIDER_SITE_OTHER): Payer: Medicare HMO

## 2019-02-07 DIAGNOSIS — E119 Type 2 diabetes mellitus without complications: Secondary | ICD-10-CM

## 2019-02-07 DIAGNOSIS — R32 Unspecified urinary incontinence: Secondary | ICD-10-CM

## 2019-02-07 DIAGNOSIS — Z1329 Encounter for screening for other suspected endocrine disorder: Secondary | ICD-10-CM | POA: Diagnosis not present

## 2019-02-07 LAB — COMPREHENSIVE METABOLIC PANEL
ALT: 17 U/L (ref 0–35)
AST: 15 U/L (ref 0–37)
Albumin: 4 g/dL (ref 3.5–5.2)
Alkaline Phosphatase: 58 U/L (ref 39–117)
BUN: 15 mg/dL (ref 6–23)
CO2: 25 mEq/L (ref 19–32)
Calcium: 8.8 mg/dL (ref 8.4–10.5)
Chloride: 111 mEq/L (ref 96–112)
Creatinine, Ser: 0.85 mg/dL (ref 0.40–1.20)
GFR: 82.41 mL/min (ref >60.00)
Glucose, Bld: 97 mg/dL (ref 70–99)
Potassium: 3.9 mEq/L (ref 3.5–5.1)
Sodium: 143 mEq/L (ref 135–145)
Total Bilirubin: 0.3 mg/dL (ref 0.2–1.2)
Total Protein: 6 g/dL (ref 6.0–8.3)

## 2019-02-07 LAB — TSH: TSH: 1.03 u[IU]/mL (ref 0.35–4.50)

## 2019-02-07 LAB — HEMOGLOBIN A1C: Hgb A1c MFr Bld: 6.5 % (ref 4.6–6.5)

## 2019-02-08 LAB — URINALYSIS, ROUTINE W REFLEX MICROSCOPIC
Bacteria, UA: NONE SEEN /HPF
Bilirubin Urine: NEGATIVE
Glucose, UA: NEGATIVE
Hgb urine dipstick: NEGATIVE
Hyaline Cast: NONE SEEN /LPF
Ketones, ur: NEGATIVE
Nitrite: NEGATIVE
Protein, ur: NEGATIVE
Specific Gravity, Urine: 1.026 (ref 1.001–1.03)
pH: 6 (ref 5.0–8.0)

## 2019-02-08 LAB — URINE CULTURE
MICRO NUMBER:: 660181
SPECIMEN QUALITY:: ADEQUATE

## 2019-02-15 ENCOUNTER — Telehealth: Payer: Self-pay | Admitting: Pulmonary Disease

## 2019-02-15 NOTE — Telephone Encounter (Signed)
Called pt to reschedule 03/28/2019 appt. Provider will be out of the office.

## 2019-02-16 NOTE — Telephone Encounter (Signed)
Appt. Rescheduled. Nothing further needed °

## 2019-02-25 ENCOUNTER — Other Ambulatory Visit: Payer: Self-pay | Admitting: Cardiovascular Disease

## 2019-03-08 ENCOUNTER — Other Ambulatory Visit: Payer: Self-pay

## 2019-03-10 ENCOUNTER — Ambulatory Visit: Payer: Medicare HMO | Admitting: Internal Medicine

## 2019-03-10 ENCOUNTER — Ambulatory Visit (INDEPENDENT_AMBULATORY_CARE_PROVIDER_SITE_OTHER): Payer: Medicare HMO | Admitting: Internal Medicine

## 2019-03-10 ENCOUNTER — Telehealth: Payer: Self-pay | Admitting: Internal Medicine

## 2019-03-10 ENCOUNTER — Encounter: Payer: Self-pay | Admitting: Internal Medicine

## 2019-03-10 ENCOUNTER — Other Ambulatory Visit: Payer: Self-pay

## 2019-03-10 VITALS — BP 110/60 | HR 63 | Temp 96.5°F | Ht 65.0 in | Wt 211.0 lb

## 2019-03-10 DIAGNOSIS — N3281 Overactive bladder: Secondary | ICD-10-CM | POA: Diagnosis not present

## 2019-03-10 DIAGNOSIS — D352 Benign neoplasm of pituitary gland: Secondary | ICD-10-CM

## 2019-03-10 DIAGNOSIS — E119 Type 2 diabetes mellitus without complications: Secondary | ICD-10-CM

## 2019-03-10 DIAGNOSIS — I10 Essential (primary) hypertension: Secondary | ICD-10-CM | POA: Diagnosis not present

## 2019-03-10 DIAGNOSIS — M25561 Pain in right knee: Secondary | ICD-10-CM

## 2019-03-10 DIAGNOSIS — G8929 Other chronic pain: Secondary | ICD-10-CM

## 2019-03-10 DIAGNOSIS — R32 Unspecified urinary incontinence: Secondary | ICD-10-CM | POA: Diagnosis not present

## 2019-03-10 MED ORDER — METFORMIN HCL 500 MG PO TABS: 500.0000 mg | ORAL_TABLET | Freq: Every day | ORAL | 3 refills | Status: DC

## 2019-03-10 MED ORDER — LOSARTAN POTASSIUM 25 MG PO TABS: 25.0000 mg | ORAL_TABLET | Freq: Every day | ORAL | 3 refills | Status: DC

## 2019-03-10 MED ORDER — TRAMADOL HCL 50 MG PO TABS: 50.0000 mg | ORAL_TABLET | Freq: Four times a day (QID) | ORAL | 0 refills | Status: DC | PRN

## 2019-03-10 NOTE — Progress Notes (Addendum)
Chief Complaint  Patient presents with  . Follow-up   F/u  1. HTN controlled on losartan 25 mg qd and toprol xl 25 mg qd  2. Right knee pain and swelling 10/10 pain with walking and walking with cane today on gabapentin 300 mg tid and and Tylenol pm but reports they are making her too sleepy she needs knee surgery with Dr. Rudene Christians but waiting on the part to replace her knee  3. Overactive bladder doing better on myrbetriq 25 mg qd but she is not taking this qd urine negative UTI  4. Reviewed labs A1C 6.5 from 6.8 she has not picked up her metformin xr per pharmacy since 11/2018 reviewed XR is on recall and will replace with IR metformin other labs tfts and lfts normal  5. 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 Lacinda Axon recently no need to repeat MRI until 06/2020  Review of Systems  Constitutional: Positive for weight loss.       Wt loss trying   HENT: Negative for hearing loss.   Eyes: Negative for blurred vision.  Respiratory: Negative for shortness of breath.   Cardiovascular: Negative for chest pain.  Gastrointestinal: Negative for abdominal pain.  Musculoskeletal: Positive for joint pain.  Skin: Negative for rash.  Neurological: Negative for headaches.  Psychiatric/Behavioral: Negative for depression.   Past Medical History:  Diagnosis Date  . (HFpEF) heart failure with preserved ejection fraction ([place])    a. 06/2016 Echo: EF 50%, no rwma, mild to mod TR.  [name] Allergy   . Anemia   . Arthritis   . Asthma   . Bacterial vaginitis   . Blood in stool   . Brachial neuritis or radiculitis NOS   . 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 being followed by neurosurgery/eye doctor and pcp. monitoring size.causes sinus problems  . Bronchitis   . Cardiac arrhythmia   . Cervical neck pain with evidence of disc disease    C5/6 disease, MRI done late 2012 - no records available  . Chronic pain   . Cocaine abuse, in remission ([place])    clean x 24 years  . Colon polyps   . COPD  (chronic obstructive pulmonary disease) (HCC)    many inhalers  . Coronary artery disease    a. PCI of LCX 2003; b. PCI of the LAD 2012 with a (2.5 x 8 mm BMS);  c.s/p CABG 4/12:  L-LAD, S-Dx, S-OM, S-RCA (Dr. Prescott Gum);  d. 01/2012 MV: inf infarct, attenuation, no ischemia.  . Depression   . Diabetes mellitus without complication ([place])   . Family history of colon cancer   . Generalized headaches    frequent  . GERD (gastroesophageal reflux disease)   . Headache   . Heart murmur   . Hematochezia   . Hepatitis    history of hepatitis b  . History of cervical cancer    s/p cryotherapy  . History of drug abuse ([place])    cocaine, marijuana, clean since 1989  . History of hepatitis B    from eating undercooked liver  . History of MI (myocardial infarction)   . Hyperlipidemia   . Hypertension   . Myocardial infarction [facility]) 2003, 2012  . PAD (peripheral artery disease) (HCC)    s/p Right SFA atherectomy and PTA 01/15/11, normal ABIs 01/2011  . Polyp of colon   . Seasonal allergies   . Sleep apnea    uses cpap  . Smoking history    quit 07/2010  .  Systolic CHF, chronic (HCC)    mild: echo 08/2010 - mildly reduced EF 40-45%, mild diffuse hypokinesis  . Thyroid disease   . Urinary incontinence   . Urine incontinence    Past Surgical History:  Procedure Laterality Date  . ABDOMINAL HYSTERECTOMY     2003  . CHOLECYSTECTOMY  1986  . COLONOSCOPY  2008   3 polyps  . COLONOSCOPY WITH PROPOFOL N/A 02/06/2015   Procedure: COLONOSCOPY WITH PROPOFOL;  Surgeon: Lucilla Lame, MD;  Location: [facility] ENDOSCOPY;  Service: Endoscopy;  Laterality: N/A;  . COLONOSCOPY WITH PROPOFOL N/A 01/27/2017   Procedure: COLONOSCOPY WITH PROPOFOL;  Surgeon: Lucilla Lame, MD;  Location: [facility] ENDOSCOPY;  Service: Endoscopy;  Laterality: N/A;  . CORONARY ANGIOPLASTY     w/ stent placement x2  . CORONARY ARTERY BYPASS GRAFT  2012   Dr [name]  . CORONARY STENT PLACEMENT  2003   S/P MI  . CORONARY STENT PLACEMENT   2007   [place]  . ESOPHAGOGASTRODUODENOSCOPY (EGD) WITH PROPOFOL N/A 02/06/2015   Procedure: ESOPHAGOGASTRODUODENOSCOPY (EGD) WITH PROPOFOL;  Surgeon: Lucilla Lame, MD;  Location: [facility] ENDOSCOPY;  Service: Endoscopy;  Laterality: N/A;  . ESOPHAGOGASTRODUODENOSCOPY (EGD) WITH PROPOFOL N/A 01/27/2017   Procedure: ESOPHAGOGASTRODUODENOSCOPY (EGD) WITH PROPOFOL;  Surgeon: Lucilla Lame, MD;  Location: [facility] ENDOSCOPY;  Service: Endoscopy;  Laterality: N/A;  . FEMORAL ARTERY STENT  10/2010   right sided (Dr. Burt Knack)  . KNEE ARTHROSCOPY WITH MEDIAL MENISECTOMY Right 08/20/2017   Procedure: KNEE ARTHROSCOPY WITH MEDIAL  AND LATERAL MENISECTOMY;  Surgeon: Hessie Knows, MD;  Location: [facility] ORS;  Service: Orthopedics;  Laterality: Right;  . POSTERIOR CERVICAL LAMINECTOMY N/A 03/08/2018   Procedure: POSTERIOR CERVICAL LAMINECTOMY-C7;  Surgeon: Deetta Perla, MD;  Location: [facility] ORS;  Service: Neurosurgery;  Laterality: N/A;  . TUBAL LIGATION     Family History  Problem Relation Age of Onset  . Hypertension Father   . Heart failure Father   . Diabetes Father   . Colon cancer Father 82  . Glaucoma Father   . Cancer Father        colorectal   . Heart disease Father   . Other Father        glaucoma  . Breast cancer Mother 35       breast cancer, late 64's  . Cancer Mother        breast  . Brain cancer Sister   . Arthritis Sister   . Diabetes Sister   . Hypertension Sister   . Kidney disease Sister   . Diabetes Brother   . Hypertension Brother   . Other Sister        brain tumor   . Coronary artery disease Neg Hx   . Stroke Neg Hx    Social History   Socioeconomic History  . Marital status: Legally Separated    Spouse name: Not on file  . Number of children: Not on file  . Years of education: Not on file  . Highest education level: Not on file  Occupational History  . Not on file  Social Needs  . Financial resource strain: Not on file  . Food insecurity    Worry: Not on file     Inability: Not on file  . Transportation needs    Medical: Not on file    Non-medical: Not on file  Tobacco Use  . Smoking status: Former Smoker    Packs/day: 1.00    Years: 38.00    Pack years: 38.00  Types: Cigarettes    Quit date: 08/12/2010    Years since quitting: 8.5  . Smokeless tobacco: Never Used  Substance and Sexual Activity  . Alcohol use: No  . Drug use: No    Types: Cocaine    Comment: Remote Hx (crack cocaine and marijuana)  . Sexual activity: Yes  Lifestyle  . Physical activity    Days per week: Not on file    Minutes per session: Not on file  . Stress: Not on file  Relationships  . Social Herbalist on phone: Not on file    Gets together: Not on file    Attends religious service: Not on file    Active member of club or organization: Not on file    Attends meetings of clubs or organizations: Not on file    Relationship status: Not on file  . Intimate partner violence    Fear of current or ex partner: Not on file    Emotionally abused: Not on file    Physically abused: Not on file    Forced sexual activity: Not on file  Other Topics Concern  . 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 albuterol (PROVENTIL HFA;VENTOLIN HFA) 108 (90 Base) MCG/ACT inhaler Inhale 2 puffs into the lungs every 6 (six) hours as needed for wheezing or shortness of breath.  [name] aspirin 81 MG tablet Take 1 tablet (81 mg total) by mouth daily.  . Azelastine HCl 0.15 % SOLN Place 2 sprays into both nostrils daily as needed (allergies).   . baclofen (LIORESAL) 10 MG tablet Take 1 tablet (10 mg total)  by mouth 3 (three) times daily.  [name] esomeprazole (NEXIUM) 40 MG capsule Take 1 capsule (40 mg total) by mouth daily. Reported on 09/04/2015  . Evolocumab (REPATHA SURECLICK) 564 MG/ML SOAJ Inject 140 mg into the skin every 14 (fourteen) days.  [name] ezetimibe (ZETIA) 10 MG tablet Take 1 tablet (10 mg total) by mouth daily.  . fluticasone (FLONASE) 50 MCG/ACT nasal spray Place 2 sprays into both nostrils daily as needed for allergies.   . fluticasone (FLOVENT HFA) 110 MCG/ACT inhaler Inhale 1 puff into the lungs 2 (two) times a day. Rinse mouth  . furosemide (LASIX) 20 MG tablet TAKE 1 TABLET BY MOUTH ONCE DAILY AND  A  SECOND  TABLET  IF  NEEDED  FOR  FLUID  BUILD  UP  . gabapentin (NEURONTIN) 300 MG capsule Take 300 mg by mouth. TID  . glucose blood (ONE TOUCH ULTRA TEST) test strip Use as instructed check cbg qd E11.9  . HYDROcodone-acetaminophen (NORCO/VICODIN) 5-325 MG tablet Take by mouth.  [name] ipratropium-albuterol (DUONEB) 0.5-2.5 (3) MG/3ML SOLN Take 3 mLs by nebulization 2 (two) times daily as needed.  . isosorbide mononitrate (IMDUR) 30 MG 24 hr tablet TAKE 1 TABLET BY MOUTH TWICE DAILY . APPOINTMENT REQUIRED FOR FUTURE REFILLS  . Lactobacillus (ACIDOPHILUS PROBIOTIC) 10 MG CAPS Take 1 capsule by mouth daily.  . Lancets MISC 1 Device by  Does not apply route daily. Lancets E11.9  . levocetirizine (XYZAL) 5 MG tablet Take 1 tablet (5 mg total) by mouth at bedtime as needed for allergies.  [name] meclizine (ANTIVERT) 25 MG tablet Take 25 mg by mouth 3 (three) times daily.   . methocarbamol (ROBAXIN) 500 MG tablet 500 mg. QID  . metoprolol tartrate (LOPRESSOR) 25 MG tablet Take 1 tablet (25 mg total) by mouth 2 (two) times daily.  . mirabegron ER (MYRBETRIQ) 25 MG TB24 tablet Take 1 tablet (25 mg total) by mouth daily.  . mupirocin ointment (BACTROBAN) 2 % Apply 1 application topically 2 (two) times daily. Prn scalp x 1 week and nose x 5 days  . nitroGLYCERIN (NITROSTAT) 0.4 MG SL tablet Place 1 tablet  (0.4 mg total) under the tongue every 5 (five) minutes as needed. (Patient taking differently: Place 0.4 mg under the tongue every 5 (five) minutes as needed for chest pain. )  . PARoxetine (PAXIL) 10 MG tablet Take 1 tablet (10 mg total) by mouth daily.  . potassium chloride (K-DUR,KLOR-CON) 10 MEQ tablet Take 1 tablet (10 mEq total) by mouth daily.  . rivaroxaban (XARELTO) 2.5 MG TABS tablet Take 1 tablet (2.5 mg total) by mouth 2 (two) times daily.  . rosuvastatin (CRESTOR) 20 MG tablet Take 1 tablet (20 mg total) by mouth at bedtime.  . senna-docusate (SENOKOT-S) 8.6-50 MG tablet Take 1 tablet by mouth daily as needed for mild constipation or moderate constipation.  . traMADol (ULTRAM) 50 MG tablet Take 1-2 tablets (50-100 mg total) by mouth every 6 (six) hours as needed.  . umeclidinium-vilanterol (ANORO ELLIPTA) 62.5-25 MCG/INH AEPB Inhale 1 puff into the lungs daily.  . Wheat Dextrin (BENEFIBER) POWD 2 teaspoons added to 4-8 ounces of liquid or food up to 3x per day prn  . [DISCONTINUED] metFORMIN (GLUCOPHAGE-XR) 500 MG 24 hr tablet Take 1 tablet (500 mg total) by mouth daily with breakfast.  . [DISCONTINUED] traMADol (ULTRAM) 50 MG tablet Take 50 mg by mouth every 6 (six) hours as needed.   Allergies  Allergen Reactions  . Latex Rash       . Shellfish Allergy Anaphylaxis    Hard shellfish/swelling in throat  . Sulfonamide Derivatives Anaphylaxis    Swelling in throat.  . Watermelon [Citrullus Vulgaris] Other (See Comments)    Throat itchy  . Other Swelling   Recent Results (from the past 2160 hour(s))  Lipid panel     Status: None   Collection Time: 12/14/18 12:47 PM  Result Value Ref Range   Cholesterol 149 0 - 200 mg/dL   Triglycerides 87 <150 mg/dL   HDL 51 >40 mg/dL   Total CHOL/HDL Ratio 2.9 RATIO   VLDL 17 0 - 40 mg/dL   LDL Cholesterol 81 0 - 99 mg/dL    Comment:        Total Cholesterol/HDL:CHD Risk Coronary Heart Disease Risk Table                     Men    Women  1/2 Average Risk   3.4   3.3  Average Risk       5.0   4.4  2 X Average Risk   9.6   7.1  3 X Average Risk  23.4   11.0        Use the calculated Patient Ratio above and the CHD Risk Table to determine the patient's CHD Risk.        ATP III  CLASSIFICATION (LDL):  <100     mg/dL   Optimal  100-129  mg/dL   Near or Above                    Optimal  130-159  mg/dL   Borderline  160-189  mg/dL   High  >190     mg/dL   Very High Performed at [facility], [address] [place], [place] [address]   Novel Coronavirus, NAA (hospital order; send-out to ref lab)     Status: None   Collection Time: 01/06/19 12:50 PM   Specimen: Nasopharyngeal Swab; Respiratory  Result Value Ref Range   SARS-CoV-2, NAA NOT DETECTED NOT DETECTED    Comment: (NOTE) This test was developed and its performance characteristics determined by [employer]. This test has not been FDA cleared or approved. This test has been authorized by FDA under an Emergency Use Authorization (EUA). This test is only authorized for the duration of time the declaration that circumstances exist justifying the authorization of the emergency use of in vitro diagnostic tests for detection of SARS-CoV-2 virus and/or diagnosis of COVID-19 infection under section 564(b)(1) of the Act, 21 U.S.C. 921JHE-1(D)(4), unless the authorization is terminated or revoked sooner. When diagnostic testing is negative, the possibility of a false negative result should be considered in the context of a patient's recent exposures and the presence of clinical signs and symptoms consistent with COVID-19. An individual without symptoms of COVID-19 and who is not shedding SARS-CoV-2 virus would expect to have a negative (not detected) result in this assay. Performed  At: [facility] [place], [place] [id number] Rush Farmer MD UD:[id number]    Friend     Comment: Performed  at [facility], [address] [place], [place] [address]  I-STAT creatinine     Status: None   Collection Time: 01/11/19  9:28 AM  Result Value Ref Range   Creatinine, Ser 0.90 0.44 - 1.00 mg/dL  HM DIABETES EYE EXAM     Status: None   Collection Time: 01/27/19 12:00 AM  Result Value Ref Range   HM Diabetic Eye Exam No Retinopathy No Retinopathy    Comment: 01/27/2019 [place] [facility]   Hemoglobin A1c     Status: None   Collection Time: 02/07/19 10:56 AM  Result Value Ref Range   Hgb A1c MFr Bld 6.5 4.6 - 6.5 %    Comment: Glycemic Control Guidelines for People with Diabetes:Non Diabetic:  <6%Goal of Therapy: <7%Additional Action Suggested:  >8%   Urine Culture     Status: None   Collection Time: 02/07/19 10:56 AM   Specimen: Blood  Result Value Ref Range   MICRO NUMBER: [id number]    SPECIMEN QUALITY: Adequate    Sample Source URINE    STATUS: FINAL    ISOLATE 1:      Single organism less than 10,000 CFU/mL isolated. These organisms, commonly found on external and internal genitalia, are considered colonizers. No further testing performed.  Urinalysis, Routine w reflex microscopic     Status: Abnormal   Collection Time: 02/07/19 10:56 AM  Result Value Ref Range   Color, Urine YELLOW YELLOW   APPearance CLEAR CLEAR   Specific Gravity, Urine 1.026 1.001 - 1.03   pH 6.0 5.0 - 8.0   Glucose, UA NEGATIVE NEGATIVE   Bilirubin Urine NEGATIVE NEGATIVE   Ketones, ur NEGATIVE NEGATIVE   Hgb urine dipstick NEGATIVE NEGATIVE   Protein, ur NEGATIVE  NEGATIVE   Nitrite NEGATIVE NEGATIVE   Leukocytes,Ua 1+ (A) NEGATIVE   WBC, UA 0-5 0 - 5 /HPF   RBC / HPF 0-2 0 - 2 /HPF   Squamous Epithelial / LPF 0-5 < OR = 5 /HPF   Bacteria, UA NONE SEEN NONE SEEN /HPF   Hyaline Cast NONE SEEN NONE SEEN /LPF   Urine-Other FEW MUCOUS THREADS   TSH     Status: None   Collection Time: 02/07/19 10:56 AM  Result Value Ref Range   TSH 1.03 0.35 - 4.50 uIU/mL  Comprehensive metabolic panel      Status: None   Collection Time: 02/07/19 10:56 AM  Result Value Ref Range   Sodium 143 135 - 145 mEq/L   Potassium 3.9 3.5 - 5.1 mEq/L   Chloride 111 96 - 112 mEq/L   CO2 25 19 - 32 mEq/L   Glucose, Bld 97 70 - 99 mg/dL   BUN 15 6 - 23 mg/dL   Creatinine, Ser 0.85 0.40 - 1.20 mg/dL   Total Bilirubin 0.3 0.2 - 1.2 mg/dL   Alkaline Phosphatase 58 39 - 117 U/L   AST 15 0 - 37 U/L   ALT 17 0 - 35 U/L   Total Protein 6.0 6.0 - 8.3 g/dL   Albumin 4.0 3.5 - 5.2 g/dL   Calcium 8.8 8.4 - 10.5 mg/dL   GFR 82.41 >60.00 mL/min   Objective  Body mass index is 35.11 kg/m. Wt Readings from Last 3 Encounters:  03/10/19 211 lb (95.7 kg)  10/05/18 213 lb 3.2 oz (96.7 kg)  09/16/18 208 lb 12 oz (94.7 kg)   Temp Readings from Last 3 Encounters:  03/10/19 (!) 96.5 F (35.8 C) (Temporal)  10/05/18 98.1 F (36.7 C) (Oral)  09/14/18 98.5 F (36.9 C) (Oral)   BP Readings from Last 3 Encounters:  03/10/19 110/60  10/05/18 100/60  09/16/18 108/62   Pulse Readings from Last 3 Encounters:  03/10/19 63  10/05/18 65  09/16/18 63    Physical Exam Vitals signs and nursing note reviewed.  Constitutional:      Appearance: Normal appearance. She is obese.     Comments: Mask on today   HENT:     Head: Normocephalic and atraumatic.     Nose: Nose normal.     Mouth/Throat:     Mouth: Mucous membranes are moist.     Pharynx: Oropharynx is clear.  Eyes:     Conjunctiva/sclera: Conjunctivae normal.     Pupils: Pupils are equal, round, and reactive to light.  Cardiovascular:     Rate and Rhythm: Normal rate and regular rhythm.     Heart sounds: Normal heart sounds.     Comments: Scar to mid chest  Pulmonary:     Effort: Pulmonary effort is normal.     Breath sounds: Normal breath sounds.  Musculoskeletal:     Right knee: She exhibits swelling. Tenderness found. Medial joint line, lateral joint line, MCL, LCL and patellar tendon tenderness noted.     Right lower leg: No edema.     Left  lower leg: No edema.  Skin:    General: Skin is warm and dry.  Neurological:     General: No focal deficit present.     Mental Status: She is alert and oriented to person, place, and time. Mental status is at baseline.     Comments: Walking slow with cane today due to right knee pain   Psychiatric:  Attention and Perception: Attention and perception normal.        Mood and Affect: Mood and affect normal.        Speech: Speech normal.        Behavior: Behavior normal. Behavior is cooperative.        Thought Content: Thought content normal.        Cognition and Memory: Cognition and memory normal.        Judgment: Judgment normal.     Assessment /plan Essential hypertension - Plan: losartan (COZAAR) 25 MG tablet, toprol 25 xl continue   Chronic pain of right knee - Plan: traMADol (ULTRAM) 50 MG tablet 50 -100 bid prn # 20 no refills  F/u ortho Dr. Rudene Christians [facility] to see when pt can be scheduled for knee surgery   Type 2 diabetes mellitus without complication, without long-term current use of insulin ([place]) - Plan: metFORMIN (GLUCOPHAGE) 500 MG tablet change from XR to IR qd  A1C improved congratulated ptd  On ARB and statin   Overactive bladder  -myrbetriq 25 is helping   Pituitary macroadenoma  -MRI due 06/2020 Dr. Deetta Perla NS   DM 2 A1C 6.5 02/07/2019 daily check blood sugar E 11.9 no insulin  On metformin 500 mg daily with breakfast   Hm Had flu shotutd -will need Tdap and,shingrix in future discuss at f/u  pna 23 had 03/10/16  S/p hysterectomy no cervix and f/u OB/GYNh/o abnormal pap s/p cryo  -pap 05/08/15 neg papwill Repeat In 5 years   Colonoscopy Dr. Allen Norris 01/2017 polyps, gastritis, diverticulosisrepeat in 5 years   Mammogram3/18/2020 negative  DEXAneg 10/13/2018  CT chest had 06/16/17-former smoker IMPRESSION: 1. Cardiomegaly with left main and three-vessel coronary arteriosclerosis. Status post CABG. 2. No acute pulmonary embolus, aortic  aneurysm or dissection. 3. Bibasilar atelectasis. Trace bilateral pleural effusions. 4. No acute osseous abnormality.  Aortic Atherosclerosis (ICD10-I70.0).   Provider: Dr. Olivia Mackie [name]-Internal Medicine

## 2019-03-10 NOTE — Telephone Encounter (Signed)
Inform pt  Changed metformin xr 500 mg qd to 1x per day regular will be ready for pick up Friday 03/11/19 as well as losartan 25 mg qd

## 2019-03-10 NOTE — Patient Instructions (Signed)
Keep up the good work A1C 6.5 from 6.8  Weight is down  Let me know if you need anything

## 2019-03-11 NOTE — Progress Notes (Signed)
Should be scheduled for this month surgery for knee per Dr. Rudene Christians Call pt

## 2019-03-11 NOTE — Telephone Encounter (Signed)
Patient was informed.  Patient understood and no questions, comments, or concerns at this time.  

## 2019-03-14 ENCOUNTER — Other Ambulatory Visit: Payer: Self-pay | Admitting: Internal Medicine

## 2019-03-14 DIAGNOSIS — F32A Depression, unspecified: Secondary | ICD-10-CM

## 2019-03-14 DIAGNOSIS — F329 Major depressive disorder, single episode, unspecified: Secondary | ICD-10-CM

## 2019-03-14 DIAGNOSIS — I2510 Atherosclerotic heart disease of native coronary artery without angina pectoris: Secondary | ICD-10-CM

## 2019-03-14 DIAGNOSIS — F419 Anxiety disorder, unspecified: Secondary | ICD-10-CM

## 2019-03-14 MED ORDER — PAROXETINE HCL 10 MG PO TABS: 10.0000 mg | ORAL_TABLET | Freq: Every day | ORAL | 3 refills | Status: DC

## 2019-03-14 MED ORDER — METOPROLOL TARTRATE 25 MG PO TABS: 25.0000 mg | ORAL_TABLET | Freq: Two times a day (BID) | ORAL | 3 refills | Status: DC

## 2019-03-15 ENCOUNTER — Other Ambulatory Visit: Payer: Self-pay

## 2019-03-15 DIAGNOSIS — E119 Type 2 diabetes mellitus without complications: Secondary | ICD-10-CM | POA: Diagnosis not present

## 2019-03-15 MED ORDER — RIVAROXABAN 2.5 MG PO TABS: 2.5000 mg | ORAL_TABLET | Freq: Two times a day (BID) | ORAL | 5 refills | Status: DC

## 2019-03-15 NOTE — Telephone Encounter (Deleted)
Please review for refill. Thanks!  

## 2019-03-17 ENCOUNTER — Telehealth: Payer: Self-pay | Admitting: Internal Medicine

## 2019-03-17 ENCOUNTER — Other Ambulatory Visit: Payer: Medicare HMO

## 2019-03-17 NOTE — Telephone Encounter (Signed)
Copied from [place] [phone]. Topic: General - Other >> Mar 17, 2019 11:43 AM Keene Breath wrote: Reason for CRM: [facility] called to check the status of patient's CPAP supplies.  Please call to discuss at [phone]

## 2019-03-17 NOTE — Telephone Encounter (Signed)
Patient has been informed.

## 2019-03-17 NOTE — Telephone Encounter (Signed)
I dont order her cpap supplies this should be pulmonary  Fransisco Beau inform pt

## 2019-03-18 ENCOUNTER — Telehealth: Payer: Self-pay | Admitting: Pulmonary Disease

## 2019-03-18 DIAGNOSIS — G4733 Obstructive sleep apnea (adult) (pediatric): Secondary | ICD-10-CM | POA: Diagnosis not present

## 2019-03-18 NOTE — Telephone Encounter (Signed)
Pt has pending OV on 03/21/2019.  Supplies will be ordered during this visit.

## 2019-03-18 NOTE — Telephone Encounter (Signed)
Called patient for COVID-19 pre-screening for in office visit. ° °Have you recently traveled any where out of the local area in the last 2 weeks? No ° °Have you been in close contact with a person diagnosed with COVID-19 or someone awaiting results within the last 2 weeks? No ° °Do you currently have any of the following symptoms? If so, when did they start? °Cough     Diarrhea   Joint Pain °Fever      Muscle Pain   Red eyes °Shortness of breath   Abdominal pain  Vomiting °Loss of smell    Rash    Sore Throat °Headache    Weakness   Bruising or bleeding ° ° °Okay to proceed with visit 03/21/2019   ° ° °

## 2019-03-21 ENCOUNTER — Other Ambulatory Visit: Payer: Self-pay

## 2019-03-21 ENCOUNTER — Ambulatory Visit (INDEPENDENT_AMBULATORY_CARE_PROVIDER_SITE_OTHER): Payer: Medicare HMO | Admitting: Pulmonary Disease

## 2019-03-21 ENCOUNTER — Encounter: Payer: Self-pay | Admitting: Pulmonary Disease

## 2019-03-21 VITALS — BP 108/78 | HR 60 | Temp 97.2°F | Ht 65.0 in | Wt 215.2 lb

## 2019-03-21 DIAGNOSIS — Z87891 Personal history of nicotine dependence: Secondary | ICD-10-CM | POA: Diagnosis not present

## 2019-03-21 DIAGNOSIS — J449 Chronic obstructive pulmonary disease, unspecified: Secondary | ICD-10-CM | POA: Diagnosis not present

## 2019-03-21 DIAGNOSIS — Z01811 Encounter for preprocedural respiratory examination: Secondary | ICD-10-CM

## 2019-03-21 DIAGNOSIS — E668 Other obesity: Secondary | ICD-10-CM

## 2019-03-21 DIAGNOSIS — G4733 Obstructive sleep apnea (adult) (pediatric): Secondary | ICD-10-CM | POA: Diagnosis not present

## 2019-03-21 NOTE — Patient Instructions (Signed)
Continue CPAP.  We will contact company to change to AutoSet 5-15 cm H2O  Continue Anoro and Flovent inhalers.  Make sure to rinse your mouth after using Flovent  Continue albuterol as needed for increased shortness of breath, wheezing, chest tightness, cough  You may proceed with planned R knee surgery next month without any restrictions from a pulmonary point of view  Follow-up in 6 months with Dr. Mortimer Fries.  Call sooner if needed

## 2019-03-21 NOTE — Progress Notes (Signed)
PULMONARY OFFICE FOLLOW UP NOTE  Requesting MD/Service: [name] Date of initial consultation: 04/10/16 Reason for consultation: COPD, OSA  PT PROFILE: 58 y.o. F former smoker with CAD, s/p CABG in 2012 referred by Dr [name] for mgmt of OSA and dyspnea. She was previously diagnosed with COPD but notably, PFTs in 2012 revealed no definite obstruction and mild decrease in DLCO.  DATA: 05/06/16 PSG: AHI 6.8/hr (during REM 11.4/hr). Lowest SpO2 87%. Poor sleep efficiency (52.5%) 05/16/16 PFTs: normal spirometry, normal lung volumes, mild reduction in DLCO 06/09/16 CTA chest: ordered by Dr [name] for CP, dyspnea. Normal study. No PE 06/24/16 CPAP titration: optimal CPAP level 10 cm H2O 09/29/16-10/28/16 compliance report: 13/30 nights. > 4 hrs 13 nights 01/11/19 HST: Moderate OSA.  AHI 27/hour.  Mild sleep-related hypoxemia.  Recommended AutoSet CPAP 5-20 cm H2O 07/22-08/20/20 Compliance: 30/30 nights. CPAP 10 cm H2O. Median AHI 3.7/hr  INTERVAL: Last encounter 12/01/2018 which was performed remotely due to the coronavirus pandemic.  At that time, she had previously been seen in 2017 and had not been compliant with CPAP for approximately 2 years..  A repeat sleep study was performed.  She was continued on Flovent and Anoro inhalers.   SUBJ: She is now compliant with CPAP.  Despite the recommended setting for AutoSet, her CPAP was fixed pressure at 10 cm H2O.  She has some difficulty tolerating this when she first places it at night.  Nonetheless, she feels better rested overall with improved daytime hypersomnolence.  With regard to breathing, she feels that her respiratory symptoms are much improved.  She denies significant exertional dyspnea.  She denies fever, cough, hemoptysis, chest pain, lower extremity edema, calf tenderness.  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  She is scheduled for R knee surgery 04/14/2019.    OBJ:  Vitals:   03/21/19 0947  BP: 108/78   Pulse: 60  Temp: (!) 97.2 F (36.2 C)  TempSrc: Temporal  SpO2: 99%  Weight: 215 lb 3.2 oz (97.6 kg)  Height: 5\' 5"  (1.651 m)  Room air   EXAM:  Gen: Moderately obese, NAD HEENT: NCAT, sclerae white Neck: No JVD Lungs: breath sounds full, no wheezes or other adventitious sounds Cardiovascular: RRR, no murmurs Abdomen: Soft, nontender, normal BS Ext: without clubbing, cyanosis, edema Neuro: grossly intact Skin: Limited exam, no lesions noted   DATA:  BMP Latest Ref Rng & Units 02/07/2019 01/11/2019 11/24/2018  Glucose 70 - 99 mg/dL 97 - -  BUN 6 - 23 mg/dL 15 - -  Creatinine 0.40 - 1.20 mg/dL 0.85 0.90 1.20(H)  BUN/Creat Ratio 9 - 23 - - -  Sodium 135 - 145 mEq/L 143 - -  Potassium 3.5 - 5.1 mEq/L 3.9 - -  Chloride 96 - 112 mEq/L 111 - -  CO2 19 - 32 mEq/L 25 - -  Calcium 8.4 - 10.5 mg/dL 8.8 - -   CBC Latest Ref Rng & Units 10/05/2018 09/05/2018 03/02/2018  WBC 4.0 - 10.5 K/uL 6.9 8.5 9.5  Hemoglobin 12.0 - 15.0 g/dL 12.7 12.5 15.5  Hematocrit 36.0 - 46.0 % 37.6 38.2 45.5  Platelets 150.0 - 400.0 K/uL 178.0 170 203   CXR: No new film  IMPRESSION:     ICD-10-CM   1. Moderate OSA on CPAP  G47.33 AMB REFERRAL FOR DME  2. Former smoker  Z87.891   3. Chronic asthmatic bronchitis ([place])  J44.9   4. Moderate obesity  E66.8     PLAN:  Continue CPAP.  We will contact company to change to AutoSet 5-15 cm H2O  Continue Anoro and Flovent inhalers.  Rinse mouth after using Flovent  Continue albuterol as needed for increased shortness of breath, wheezing, chest tightness, cough  She may proceed with planned R knee surgery next month without any restrictions from a pulmonary point of view.  Her risk of perioperative pulmonary complications is minimally elevated compared to general population due to obesity and mild chronic asthmatic bronchitis.  She should continue on her scheduled inhaler medications as documented.  Follow-up in 6 months with Dr. Mortimer Fries.  Call sooner if  needed   Merton Border, MD PCCM service Mobile [phone] Pager [phone] 03/21/2019 10:15 AM

## 2019-03-24 ENCOUNTER — Telehealth: Payer: Self-pay | Admitting: Internal Medicine

## 2019-03-24 NOTE — Telephone Encounter (Signed)
[name] states they received a RX for pt's CPAP supplies but it wasn't dated, signed, or had providers NPI number.  They need this information included and refaxed to: [phone].

## 2019-03-28 ENCOUNTER — Ambulatory Visit: Payer: Medicare HMO | Admitting: Pulmonary Disease

## 2019-03-28 NOTE — Telephone Encounter (Signed)
[name] states they received a RX for pt's CPAP supplies but it wasn't dated, signed, or had providers NPI number.  They need this information included and refaxed to: [phone].

## 2019-03-29 ENCOUNTER — Other Ambulatory Visit: Payer: Self-pay

## 2019-03-29 ENCOUNTER — Encounter: Payer: Self-pay | Admitting: Internal Medicine

## 2019-03-29 ENCOUNTER — Ambulatory Visit (INDEPENDENT_AMBULATORY_CARE_PROVIDER_SITE_OTHER): Payer: Medicare HMO | Admitting: Internal Medicine

## 2019-03-29 ENCOUNTER — Telehealth: Payer: Self-pay

## 2019-03-29 VITALS — BP 130/72 | HR 88 | Temp 98.4°F

## 2019-03-29 DIAGNOSIS — R1032 Left lower quadrant pain: Secondary | ICD-10-CM | POA: Diagnosis not present

## 2019-03-29 DIAGNOSIS — Z23 Encounter for immunization: Secondary | ICD-10-CM | POA: Diagnosis not present

## 2019-03-29 DIAGNOSIS — M25552 Pain in left hip: Secondary | ICD-10-CM | POA: Diagnosis not present

## 2019-03-29 DIAGNOSIS — M779 Enthesopathy, unspecified: Secondary | ICD-10-CM | POA: Diagnosis not present

## 2019-03-29 MED ORDER — OXYCODONE-ACETAMINOPHEN 5-325 MG PO TABS: 1.0000 | ORAL_TABLET | Freq: Two times a day (BID) | ORAL | 0 refills | Status: DC | PRN

## 2019-03-29 NOTE — Telephone Encounter (Signed)
Patient said that she has been having pain in her left leg and groin area for about 1 week.  Patient said pain has gotten worse.  Patient said that she is swollen in the groin area and has a knot and the her left thigh is also swollen.  Patient rates pain being a 10 today. Patient said that she can barely walk.  Patient does not want to go to UC.  She says that she doesn't trust UC due to her medical condition.  Patient would like to schedule appt today with her PCP.  Patient scheduled for 3:00 pm today w/ Dr. Aundra Dubin.  No COVID symptoms.  Patient advised to wear a mask.  Patient said she will have her sister driver her to the appt.

## 2019-03-29 NOTE — Patient Instructions (Signed)
Tendinitis  Tendinitis is inflammation of a tendon. A tendon is a strong cord of tissue that connects muscle to bone. Tendinitis can affect any tendon, but it most commonly affects the:  Shoulder tendon (rotator cuff).  Ankle tendon (Achilles tendon).  Elbow tendon (triceps tendon).  Tendons in the wrist. What are the causes? This condition may be caused by:  Overusing a tendon or muscle. This is common.  Age-related wear and tear.  Injury.  Inflammatory conditions, such as arthritis.  Certain medicines. What increases the risk? You are more likely to develop this condition if you do activities that involve the same movements over and over again (repetitive motions). What are the signs or symptoms? Symptoms of this condition may include:  Pain.  Tenderness.  Mild swelling.  Decreased range of motion. How is this diagnosed? This condition is diagnosed with a physical exam. You may also have tests, such as:  Ultrasound. This uses sound waves to make an image of the inside of your body in the affected area.  MRI. How is this treated? This condition may be treated by resting, icing, applying pressure (compression), and raising (elevating) the affected area above the level of your heart. This is known as RICE therapy. Treatment may also include:  Medicines to help reduce inflammation or to help reduce pain.  Exercises or physical therapy to strengthen and stretch the tendon.  A brace or splint.  Surgery. This is rarely needed. Follow these instructions at home: If you have a splint or brace:  Wear the splint or brace as told by your health care provider. Remove it only as told by your health care provider.  Loosen the splint or brace if your fingers or toes tingle, become numb, or turn cold and blue.  Keep the splint or brace clean.  If the splint or brace is not waterproof: ? Do not let it get wet. ? Cover it with a watertight covering when you take a bath  or shower. Managing pain, stiffness, and swelling  If directed, put ice on the affected area. ? If you have a removable splint or brace, remove it as told by your health care provider. ? Put ice in a plastic bag. ? Place a towel between your skin and the bag. ? Leave the ice on for 20 minutes, 2-3 times a day.  Move the fingers or toes of the affected limb often, if this applies. This can help to prevent stiffness and lessen swelling.  If directed, raise (elevate) the affected area above the level of your heart while you are sitting or lying down.  If directed, apply heat to the affected area before you exercise. Use the heat source that your health care provider recommends, such as a moist heat pack or a heating pad.     ? Place a towel between your skin and the heat source. ? Leave the heat on for 20-30 minutes. ? Remove the heat if your skin turns bright red. This is especially important if you are unable to feel pain, heat, or cold. You may have a greater risk of getting burned. Driving  Do not drive or use heavy machinery while taking prescription pain medicine.  Ask your health care provider when it is safe to drive if you have a splint or brace on any part of your arm or leg. Activity  Rest the affected area as told by your health care provider.  Return to your normal activities as told by your health care   provider. Ask your health care provider what activities are safe for you.  Avoid using the affected area while you are experiencing symptoms of tendinitis.  Do exercises as told by your health care provider. General instructions  If you have a splint, do not put pressure on any part of the splint until it is fully hardened. This may take several hours.  Wear an elastic bandage or compression wrap only as told by your health care provider.  Take over-the-counter and prescription medicines only as told by your health care provider.  Keep all follow-up visits as told  by your health care provider. This is important. Contact a health care provider if:  Your symptoms do not improve.  You develop new, unexplained problems, such as numbness in your hands. Summary  Tendinitis is inflammation of a tendon.  You are more likely to develop this condition if you do activities that involve the same movements over and over again.  This condition may be treated by resting, icing, applying pressure (compression), and elevating the area above the level of your heart. This is known as RICE therapy.  Avoid using the affected area while you are experiencing symptoms of tendinitis. This information is not intended to replace advice given to you by your health care provider. Make sure you discuss any questions you have with your health care provider. Document Released: 07/11/2000 Document Revised: 01/19/2018 Document Reviewed: 12/02/2017 Elsevier Patient Education  2020 [place].  Hip Pain  The hip is the joint between the upper legs and the lower pelvis. The bones, cartilage, tendons, and muscles of your hip joint support your body and allow you to move around. Hip pain can range from a minor ache to severe pain in one or both of your hips. The pain may be felt on the inside of the hip joint near the groin, or the outside near the buttocks and upper thigh. You may also have swelling or stiffness. Follow these instructions at home: Managing pain, stiffness, and swelling  If directed, apply ice to the injured area. ? Put ice in a plastic bag. ? Place a towel between your skin and the bag. ? Leave the ice on for 20 minutes, 2-3 times a day  Sleep with a pillow between your legs on your most comfortable side.  Avoid any activities that cause pain. General instructions  Take over-the-counter and prescription medicines only as told by your health care provider.  Do any exercises as told by your health care provider.  Record the following: ? How often you have  hip pain. ? The location of your pain. ? What the pain feels like. ? What makes the pain worse.  Keep all follow-up visits as told by your health care provider. This is important. Contact a health care provider if:  You cannot put weight on your leg.  Your pain or swelling continues or gets worse after one week.  It gets harder to walk.  You have a fever. Get help right away if:  You fall.  You have a sudden increase in pain and swelling in your hip.  Your hip is red or swollen or very tender to touch. Summary  Hip pain can range from a minor ache to severe pain in one or both of your hips.  The pain may be felt on the inside of the hip joint near the groin, or the outside near the buttocks and upper thigh.  Avoid any activities that cause pain.  Record how often  you have hip pain, the location of the pain, what makes it worse and what it feels like. This information is not intended to replace advice given to you by your health care provider. Make sure you discuss any questions you have with your health care provider. Document Released: 01/01/2010 Document Revised: 06/26/2017 Document Reviewed: 06/16/2016 Elsevier Patient Education  2020 [place].  Piriformis Syndrome Rehab Ask your health care provider which exercises are safe for you. Do exercises exactly as told by your health care provider and adjust them as directed. It is normal to feel mild stretching, pulling, tightness, or discomfort as you do these exercises. Stop right away if you feel sudden pain or your pain gets worse. Do not begin these exercises until told by your health care provider. Stretching and range-of-motion exercises These exercises warm up your muscles and joints and improve the movement and flexibility of your hip and pelvis. The exercises also help to relieve pain, numbness, and tingling. Hip rotation This is an exercise in which you lie on your back and stretch the muscles that rotate your hip  (hip rotators) to stretch your buttocks. 1. Lie on your back on a firm surface. 2. Pull your left / right knee toward your same shoulder with your left / right hand until your knee is pointing toward the ceiling. Hold your left / right ankle with your other hand. 3. Keeping your knee steady, gently pull your left / right ankle toward your other shoulder until you feel a stretch in your buttocks. 4. Hold this position for __________ seconds. Repeat __________ times. Complete this exercise __________ times a day. Hip extensor This is an exercise in which you lie on your back and pull your knee to your chest. 1. Lie on your back on a firm surface. Both of your legs should be straight. 2. Pull your left / right knee to your chest. Hold your leg in this position by holding onto the back of your thigh or the front of your knee. 3. Hold this position for __________ seconds. 4. Slowly return to the starting position. Repeat __________ times. Complete this exercise __________ times a day. Strengthening exercises These exercises build strength and endurance in your hip and thigh muscles. Endurance is the ability to use your muscles for a long time, even after they get tired. Straight leg raises, side-lying This exercise strengthens the muscles that rotate the leg at the hip and move it away from your body (hip abductors). 1. Lie on your side with your left / right leg in the top position. Lie so your head, shoulder, knee, and hip line up. Bend your bottom knee to help you balance. 2. Lift your top leg 4-6 inches (10-15 cm) while keeping your toes pointed straight ahead. 3. Hold this position for __________ seconds. 4. Slowly lower your leg to the starting position. 5. Let your muscles relax completely after each repetition. Repeat __________ times. Complete this exercise __________ times a day. Hip abduction and rotation This is sometimes called quadruped (on hands and knees) exercises. 1. Get on your  hands and knees on a firm, lightly padded surface. Your hands should be directly below your shoulders, and your knees should be directly below your hips. 2. Lift your left / right knee out to the side. Keep your knee bent. Do not twist your body. 3. Hold this position for __________ seconds. 4. Slowly lower your leg. Repeat __________ times. Complete this exercise __________ times a day. Straight leg raises,  face-down This exercise stretches the muscles that move your hips away from the front of the pelvis (hip extensors). 1. Lie on your abdomen on a bed or a firm surface with a pillow under your hips. 2. Squeeze your buttocks muscles and lift your left / right leg about 4-6 inches (10-15 cm) off the bed. Do not let your back arch. 3. Hold this position for __________ seconds. 4. Slowly lower your leg to the starting position. 5. Let your muscles relax completely after each repetition. Repeat __________ times. Complete this exercise __________ times a day. This information is not intended to replace advice given to you by your health care provider. Make sure you discuss any questions you have with your health care provider. Document Released: 07/14/2005 Document Revised: 11/04/2018 Document Reviewed: 05/06/2018 Elsevier Patient Education  2020 [place].  Adductor Muscle Strain  An adductor muscle strain, also called a groin strain or pull, is an injury to the muscles or tendons on the upper, inner part of the thigh. These muscles are called the adductor muscles or groin muscles. They are responsible for moving the legs across the body or pulling the legs together. A muscle strain occurs when a muscle is overstretched and some muscle fibers are torn. An adductor muscle strain can range from mild to severe, depending on how many muscle fibers are affected and whether the muscle fibers are partially or completely torn. What are the causes? Adductor muscle strains usually occur during exercise  or while participating in sports. The injury often happens when a sudden, violent force is placed on a muscle, stretching the muscle too far. A strain is more likely to happen when your muscles are not warmed up or if you are not properly conditioned. This injury may be caused by:  Stretching the adductor muscles too far or too suddenly, often during side-to-side motion with a sudden change in direction.  Putting repeated stress on the adductor muscles over a long period of time.  Performing vigorous activity without properly stretching the adductor muscles beforehand. What are the signs or symptoms? Symptoms of this condition include:  Pain and tenderness in the groin area. This begins as sharp pain and persists as a dull ache.  A popping or snapping feeling when the injury occurs (for severe strains).  Swelling or bruising.  Muscle spasms.  Weakness in the leg.  Stiffness in the groin area with decreased ability to move the affected muscles. How is this diagnosed? This condition may be diagnosed based on:  Your symptoms and a description of how the injury occurred.  A physical exam.  Imaging tests, such as: ? X-rays. These are sometimes needed to rule out a broken bone or cartilage problems. ? An ultrasound, CT scan, or MRI. These may be done if your health care provider suspects a complete muscle tear or needs to check for other injuries. How is this treated? An adductor strain will often heal on its own. If needed, this condition may be treated with:  PRICE therapy. PRICE stands for protection of the injured area, rest, ice, pressure (compression), and elevation.  Medicines to help manage pain and swelling (anti-inflammatory medicines).  Crutches. You may be directed to use these for the first few days to minimize your pain. Depending on the severity of the muscle strain, recovery time may vary from a few weeks to several months. Severe injuries often require 4-6 weeks for  recovery. In those cases, complete healing can take 4-5 months. Follow these instructions  at home: [place] the muscle from being injured again.  Rest. Do not use the strained muscle if it causes pain.  If directed, put ice on the injured area: ? Put ice in a plastic bag. ? Place a towel between your skin and the bag. ? Leave the ice on for 20 minutes, 2-3 times a day. Do this for the first 2 days after the injury.  Apply compression by wrapping the injured area with an elastic bandage as told by your health care provider.  Raise (elevate) the injured area above the level of your heart while you are sitting or lying down. General instructions  Take over-the-counter and prescription medicines only as told by your health care provider.  Walk, stretch, and do exercises as told by your health care provider. Only do these activities if you can do so without any pain.  Follow your treatment plan as told by your health care provider. This may include: ? Physical therapy. ? Massage. ? Local electrical stimulation (transcutaneous electrical nerve stimulation, TENS). How is this prevented?  Warm up and stretch before being active.  Cool down and stretch after being active.  Give your body time to rest between periods of activity.  Make sure to use equipment that fits you.  Be safe and responsible while being active to avoid slips and falls.  Maintain physical fitness, including: ? Proper conditioning in the adductor muscles. ? Overall strength, flexibility, and endurance. Contact a health care provider if:  You have increased pain or swelling in the affected area.  Your symptoms are not improving or they are getting worse. Summary  An adductor muscle strain, also called a groin strain or pull, is an injury to the muscles or tendons on the upper, inner part of the thigh.  A muscle strain occurs when a muscle is overstretched and some muscle fibers are torn.   Depending on the severity of the muscle strain, recovery time may vary from a few weeks to several months. This information is not intended to replace advice given to you by your health care provider. Make sure you discuss any questions you have with your health care provider. Document Released: 03/11/2004 Document Revised: 11/02/2018 Document Reviewed: 12/14/2017 Elsevier Patient Education  2020 [employer].

## 2019-03-29 NOTE — Telephone Encounter (Signed)
Spoke with company and what they are looking for is the Rx that the company faxed, not our order.  Upon checking the fax number with her, she had the fax number incorrect. Gave her the correct fax number and once we received this it will be placed in Dr. Alva Garnet folder to sign. Rhonda J Cobb

## 2019-03-29 NOTE — Telephone Encounter (Signed)
Copied from [place] [phone]. Topic: Appointment Scheduling - Scheduling Inquiry for Clinic >> Mar 29, 2019 11:25 AM Yvette Rack wrote: Reason for CRM: Pt stated she is having lots of pain on her left side and in between her legs. Attempted to transfer pt to the office as she is requesting an appt today if possible but the call was placed on hold. Pt requests call back.

## 2019-03-30 NOTE — Progress Notes (Signed)
Chief Complaint  Patient presents with   Follow-up   Acute visit  1. C/o left groin muscle pain radiating to left labia and making it hard to walk like a "rock" x 1 week though this has been recurrent and last flare was 08/2018. Pain 10/10 and using a walker today with left leg weakness. Pain radiates to left upper inner thigh and she hears a popping noise reviewed prior imaging h/o L4/5 synovial cyst  And left foraminal narrowing lumbar imaging and h/o pubic edema/mild arthritis in pelvis and mild tendonitis. She is out of tramadol but has taken 2 w/o relief=100 mg. She reports she has seen swelling in the groin area on the left   2. Want flu shot today    Review of Systems  Constitutional: Negative for weight loss.  HENT: Negative for hearing loss.   Eyes: Negative for blurred vision.  Respiratory: Negative for shortness of breath.   Cardiovascular: Negative for chest pain.  Gastrointestinal: Negative for abdominal pain.  Musculoskeletal: Positive for joint pain.  Skin: Negative for rash.  Psychiatric/Behavioral: Negative for depression.   Past Medical History:  Diagnosis Date   (HFpEF) heart failure with preserved ejection fraction ([place])    a. 06/2016 Echo: EF 50%, no rwma, mild to mod TR.   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 being followed by neurosurgery/eye doctor and pcp. monitoring size.causes sinus problems   Bronchitis    Cardiac arrhythmia    Cervical neck pain with evidence of disc disease    C5/6 disease, MRI done late 2012 - no records available   Chronic pain    Cocaine abuse, in remission ([place])    clean x 24 years   Colon polyps    COPD (chronic obstructive pulmonary disease) (HCC)    many inhalers   Coronary artery disease    a. PCI of LCX 2003; b. PCI of the LAD 2012 with a (2.5 x 8 mm BMS);   c.s/p CABG 4/12:  L-LAD, S-Dx, S-OM, S-RCA (Dr. Prescott Gum);  d. 01/2012 MV: inf infarct, attenuation, no ischemia.   Depression    Diabetes mellitus without complication ([place])    Family history of colon cancer    Generalized headaches    frequent   GERD (gastroesophageal reflux disease)    Headache    Heart murmur    Hematochezia    Hepatitis    history of hepatitis b   History of cervical cancer    s/p cryotherapy   History of drug abuse ([place])    cocaine, marijuana, clean since 1989   History of hepatitis B    from eating undercooked liver   History of MI (myocardial infarction)    Hyperlipidemia    Hypertension    Myocardial infarction ([place]) 2003, 2012   PAD (peripheral artery disease) ([place])    s/p Right SFA atherectomy and PTA 01/15/11, normal ABIs 01/2011   Polyp of colon    Seasonal allergies    Sleep apnea    uses cpap   Smoking history    quit [id number]   Systolic CHF, chronic (HCC)    mild: echo 08/2010 - mildly reduced EF 40-45%, mild diffuse hypokinesis   Thyroid disease    Urinary incontinence    Urine incontinence  Past Surgical History:  Procedure Laterality Date   ABDOMINAL HYSTERECTOMY     2003   CHOLECYSTECTOMY  1986   COLONOSCOPY  2008   3 polyps   COLONOSCOPY WITH PROPOFOL N/A 02/06/2015   Procedure: COLONOSCOPY WITH PROPOFOL;  Surgeon: Lucilla Lame, MD;  Location: [facility] ENDOSCOPY;  Service: Endoscopy;  Laterality: N/A;   COLONOSCOPY WITH PROPOFOL N/A 01/27/2017   Procedure: COLONOSCOPY WITH PROPOFOL;  Surgeon: Lucilla Lame, MD;  Location: [facility] ENDOSCOPY;  Service: Endoscopy;  Laterality: N/A;   CORONARY ANGIOPLASTY     w/ stent placement x2   CORONARY ARTERY BYPASS GRAFT  2012   Dr [name]   CORONARY STENT PLACEMENT  2003   S/P MI   CORONARY STENT PLACEMENT  2007   [place]   ESOPHAGOGASTRODUODENOSCOPY (EGD) WITH PROPOFOL N/A 02/06/2015   Procedure: ESOPHAGOGASTRODUODENOSCOPY (EGD) WITH PROPOFOL;  Surgeon: Lucilla Lame,  MD;  Location: [facility] ENDOSCOPY;  Service: Endoscopy;  Laterality: N/A;   ESOPHAGOGASTRODUODENOSCOPY (EGD) WITH PROPOFOL N/A 01/27/2017   Procedure: ESOPHAGOGASTRODUODENOSCOPY (EGD) WITH PROPOFOL;  Surgeon: Lucilla Lame, MD;  Location: [facility] ENDOSCOPY;  Service: Endoscopy;  Laterality: N/A;   FEMORAL ARTERY STENT  10/2010   right sided (Dr. Burt Knack)   KNEE ARTHROSCOPY WITH MEDIAL MENISECTOMY Right 08/20/2017   Procedure: KNEE ARTHROSCOPY WITH MEDIAL  AND LATERAL MENISECTOMY;  Surgeon: Hessie Knows, MD;  Location: [facility] ORS;  Service: Orthopedics;  Laterality: Right;   POSTERIOR CERVICAL LAMINECTOMY N/A 03/08/2018   Procedure: POSTERIOR CERVICAL LAMINECTOMY-C7;  Surgeon: Deetta Perla, MD;  Location: [facility] ORS;  Service: Neurosurgery;  Laterality: N/A;   TUBAL LIGATION     Family History  Problem Relation Age of Onset   Hypertension Father    Heart failure Father    Diabetes Father    Colon cancer Father 49   Glaucoma Father    Cancer Father        colorectal    Heart disease Father    Other Father        glaucoma   Breast cancer Mother 43       breast cancer, late 44's   Cancer Mother        breast   Brain cancer Sister    Arthritis Sister    Diabetes Sister    Hypertension Sister    Kidney disease Sister    Diabetes Brother    Hypertension Brother    Other Sister        brain tumor    Coronary artery disease Neg Hx    Stroke Neg Hx    Social History   Socioeconomic History   Marital status: Legally Separated    Spouse name: Not on file   Number of children: Not on file   Years of education: Not on file   Highest education level: Not on file  Occupational History   Not on file  Social Needs   Financial resource strain: Not on file   Food insecurity    Worry: Not on file    Inability: Not on file   Transportation needs    Medical: Not on file    Non-medical: Not on file  Tobacco Use   Smoking status: Former Smoker    Packs/day: 1.00     Years: 38.00    Pack years: 38.00    Types: Cigarettes    Quit date: 08/12/2010    Years since quitting: 8.6   Smokeless tobacco: Never Used  Substance and Sexual Activity   Alcohol use: No   Drug use:  No    Types: Cocaine    Comment: Remote Hx (crack cocaine and marijuana)   Sexual activity: Yes  Lifestyle   Physical activity    Days per week: Not on file    Minutes per session: Not on file   Stress: Not on file  Relationships   Social connections    Talks on phone: Not on file    Gets together: Not on file    Attends religious service: Not on file    Active member of club or organization: Not on file    Attends meetings of clubs or organizations: Not on file    Relationship status: Not on file   Intimate partner violence    Fear of current or ex partner: Not on file    Emotionally abused: Not on file    Physically abused: Not on file    Forced sexual activity: Not on file  Other Topics Concern   Not on file  Social History Narrative   Caffeine: 1 cup coffee/day   Lives with family, no pets   Occupation: industrial work, 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   aspirin 81 MG tablet Take 1 tablet (81 mg total) by mouth daily.   Azelastine HCl 0.15 % SOLN Place 2 sprays into both nostrils daily as needed (allergies).    esomeprazole (NEXIUM) 40 MG capsule Take 1 capsule (40 mg total) by mouth daily. Reported on 09/04/2015   Evolocumab (REPATHA SURECLICK) [id number] MG/ML SOAJ Inject 140 mg into the skin every 14 (fourteen) days.   ezetimibe (ZETIA) 10 MG  tablet Take 1 tablet (10 mg total) by mouth daily.   fluticasone (FLONASE) 50 MCG/ACT nasal spray Place 2 sprays into both nostrils daily as needed for allergies.    fluticasone (FLOVENT HFA) 110 MCG/ACT inhaler Inhale 1 puff into the lungs 2 (two) times a day. Rinse mouth   furosemide (LASIX) 20 MG tablet TAKE 1 TABLET BY MOUTH ONCE DAILY AND  A  SECOND  TABLET  IF  NEEDED  FOR  FLUID  BUILD  UP   gabapentin (NEURONTIN) 300 MG capsule Take 300 mg by mouth. TID   glucose blood (ONE TOUCH ULTRA TEST) test strip Use as instructed check cbg qd E11.9   ipratropium-albuterol (DUONEB) 0.5-2.5 (3) MG/3ML SOLN Take 3 mLs by nebulization 2 (two) times daily as needed.   isosorbide mononitrate (IMDUR) 30 MG 24 hr tablet TAKE 1 TABLET BY MOUTH TWICE DAILY . APPOINTMENT REQUIRED FOR FUTURE REFILLS   Lactobacillus (ACIDOPHILUS PROBIOTIC) 10 MG CAPS Take 1 capsule by mouth daily.   Lancets MISC 1 Device by Does not apply route daily. Lancets E11.9   levocetirizine (XYZAL) 5 MG tablet Take 1 tablet (5 mg total) by mouth at bedtime as needed for allergies.   losartan (COZAAR) 25 MG tablet Take 1 tablet (25 mg total) by mouth daily.   meclizine (ANTIVERT) 25 MG tablet Take 25 mg by mouth 3 (three) times daily.    metFORMIN (  GLUCOPHAGE) 500 MG tablet Take 1 tablet (500 mg total) by mouth daily with breakfast.   methocarbamol (ROBAXIN) 500 MG tablet 500 mg. QID   metoprolol tartrate (LOPRESSOR) 25 MG tablet Take 1 tablet (25 mg total) by mouth 2 (two) times daily.   mirabegron ER (MYRBETRIQ) 25 MG TB24 tablet Take 1 tablet (25 mg total) by mouth daily.   mupirocin ointment (BACTROBAN) 2 % Apply 1 application topically 2 (two) times daily. Prn scalp x 1 week and nose x 5 days   nitroGLYCERIN (NITROSTAT) 0.4 MG SL tablet Place 1 tablet (0.4 mg total) under the tongue every 5 (five) minutes as needed. (Patient taking differently: Place 0.4 mg under the tongue every 5 (five) minutes as needed for chest  pain. )   PARoxetine (PAXIL) 10 MG tablet Take 1 tablet (10 mg total) by mouth daily.   potassium chloride (K-DUR,KLOR-CON) 10 MEQ tablet Take 1 tablet (10 mEq total) by mouth daily.   rivaroxaban (XARELTO) 2.5 MG TABS tablet Take 1 tablet (2.5 mg total) by mouth 2 (two) times daily.   rosuvastatin (CRESTOR) 20 MG tablet Take 1 tablet (20 mg total) by mouth at bedtime.   senna-docusate (SENOKOT-S) 8.6-50 MG tablet Take 1 tablet by mouth daily as needed for mild constipation or moderate constipation.   traMADol (ULTRAM) 50 MG tablet Take 1-2 tablets (50-100 mg total) by mouth every 6 (six) hours as needed.   umeclidinium-vilanterol (ANORO ELLIPTA) 62.5-25 MCG/INH AEPB Inhale 1 puff into the lungs daily.   Wheat Dextrin (BENEFIBER) POWD 2 teaspoons added to 4-8 ounces of liquid or food up to 3x per day prn   Allergies  Allergen Reactions   Latex Rash        Shellfish Allergy Anaphylaxis    Hard shellfish/swelling in throat   Sulfonamide Derivatives Anaphylaxis    Swelling in throat.   Watermelon [Citrullus Vulgaris] Other (See Comments)    Throat itchy   Soy Allergy     Diarrhea/upset stomach     Other Swelling   Recent Results (from the past 2160 hour(s))  Novel Coronavirus, NAA (hospital order; send-out to ref lab)     Status: None   Collection Time: 01/06/19 12:50 PM   Specimen: Nasopharyngeal Swab; Respiratory  Result Value Ref Range   SARS-CoV-2, NAA NOT DETECTED NOT DETECTED    Comment: (NOTE) This test was developed and its performance characteristics determined by [employer]. This test has not been FDA cleared or approved. This test has been authorized by FDA under an Emergency Use Authorization (EUA). This test is only authorized for the duration of time the declaration that circumstances exist justifying the authorization of the emergency use of in vitro diagnostic tests for detection of SARS-CoV-2 virus and/or diagnosis of COVID-19 infection  under section 564(b)(1) of the Act, 21 U.S.C. KA:123727), unless the authorization is terminated or revoked sooner. When diagnostic testing is negative, the possibility of a false negative result should be considered in the context of a patient's recent exposures and the presence of clinical signs and symptoms consistent with COVID-19. An individual without symptoms of COVID-19 and who is not shedding SARS-CoV-2 virus would expect to have a negative (not detected) result in this assay. Performed  At: [facility] [address] [place], [place] [id number] Rush Farmer MD [id number]    Coronavirus Source NASOPHARYNGEAL     Comment: Performed at [facility], [address] [place], [place] [address]  I-STAT creatinine     Status: None  Collection Time: 01/11/19  9:28 AM  Result Value Ref Range   Creatinine, Ser 0.90 0.44 - 1.00 mg/dL  HM DIABETES EYE EXAM     Status: None   Collection Time: 01/27/19 12:00 AM  Result Value Ref Range   HM Diabetic Eye Exam No Retinopathy No Retinopathy    Comment: 01/27/2019 [facility] eye   Hemoglobin A1c     Status: None   Collection Time: 02/07/19 10:56 AM  Result Value Ref Range   Hgb A1c MFr Bld 6.5 4.6 - 6.5 %    Comment: Glycemic Control Guidelines for People with Diabetes:Non Diabetic:  <6%Goal of Therapy: <7%Additional Action Suggested:  >8%   Urine Culture     Status: None   Collection Time: 02/07/19 10:56 AM   Specimen: Blood  Result Value Ref Range   MICRO NUMBER: [id number]    SPECIMEN QUALITY: Adequate    Sample Source URINE    STATUS: FINAL    ISOLATE 1:      Single organism less than 10,000 CFU/mL isolated. These organisms, commonly found on external and internal genitalia, are considered colonizers. No further testing performed.  Urinalysis, Routine w reflex microscopic     Status: Abnormal   Collection Time: 02/07/19 10:56 AM  Result Value Ref Range   Color, Urine YELLOW YELLOW   APPearance CLEAR CLEAR     Specific Gravity, Urine 1.026 1.001 - 1.03   pH 6.0 5.0 - 8.0   Glucose, UA NEGATIVE NEGATIVE   Bilirubin Urine NEGATIVE NEGATIVE   Ketones, ur NEGATIVE NEGATIVE   Hgb urine dipstick NEGATIVE NEGATIVE   Protein, ur NEGATIVE NEGATIVE   Nitrite NEGATIVE NEGATIVE   Leukocytes,Ua 1+ (A) NEGATIVE   WBC, UA 0-5 0 - 5 /HPF   RBC / HPF 0-2 0 - 2 /HPF   Squamous Epithelial / LPF 0-5 < OR = 5 /HPF   Bacteria, UA NONE SEEN NONE SEEN /HPF   Hyaline Cast NONE SEEN NONE SEEN /LPF   Urine-Other FEW MUCOUS THREADS   TSH     Status: None   Collection Time: 02/07/19 10:56 AM  Result Value Ref Range   TSH 1.03 0.35 - 4.50 uIU/mL  Comprehensive metabolic panel     Status: None   Collection Time: 02/07/19 10:56 AM  Result Value Ref Range   Sodium 143 135 - 145 mEq/L   Potassium 3.9 3.5 - 5.1 mEq/L   Chloride 111 96 - 112 mEq/L   CO2 25 19 - 32 mEq/L   Glucose, Bld 97 70 - 99 mg/dL   BUN 15 6 - 23 mg/dL   Creatinine, Ser 0.85 0.40 - 1.20 mg/dL   Total Bilirubin 0.3 0.2 - 1.2 mg/dL   Alkaline Phosphatase 58 39 - 117 U/L   AST 15 0 - 37 U/L   ALT 17 0 - 35 U/L   Total Protein 6.0 6.0 - 8.3 g/dL   Albumin 4.0 3.5 - 5.2 g/dL   Calcium 8.8 8.4 - 10.5 mg/dL   GFR 82.41 >60.00 mL/min   Objective  There is no height or weight on file to calculate BMI. Wt Readings from Last 3 Encounters:  03/21/19 215 lb 3.2 oz (97.6 kg)  03/10/19 211 lb (95.7 kg)  10/05/18 213 lb 3.2 oz (96.7 kg)   Temp Readings from Last 3 Encounters:  03/29/19 98.4 F (36.9 C) (Oral)  03/21/19 (!) 97.2 F (36.2 C) (Temporal)  03/10/19 (!) 96.5 F (35.8 C) (Temporal)   BP Readings from Last 3  Encounters:  03/29/19 130/72  03/21/19 108/78  03/10/19 110/60   Pulse Readings from Last 3 Encounters:  03/29/19 88  03/21/19 60  03/10/19 63    Physical Exam Vitals signs and nursing note reviewed.  Constitutional:      Appearance: Normal appearance. She is well-developed and well-groomed. She is obese.  HENT:      Head: Normocephalic and atraumatic.     Comments: +mask on   Cardiovascular:     Rate and Rhythm: Normal rate and regular rhythm.     Heart sounds: Normal heart sounds. No murmur.  Pulmonary:     Effort: Pulmonary effort is normal.     Breath sounds: Normal breath sounds.  Musculoskeletal:     Left hip: She exhibits tenderness, bony tenderness and swelling.       Legs:  Skin:      Neurological:     General: No focal deficit present.     Mental Status: She is alert and oriented to person, place, and time.     Gait: Gait abnormal.     Comments: Walking with walker today   Psychiatric:        Attention and Perception: Attention and perception normal.        Mood and Affect: Mood and affect normal.        Speech: Speech normal.        Behavior: Behavior normal. Behavior is cooperative.        Thought Content: Thought content normal.        Cognition and Memory: Cognition and memory normal.        Judgment: Judgment normal.     Assessment  Plan  Left groin/hip pain ddx MSK strain, FAI, labral tear, OA, pubic edema and arthritis as noted previously, tendonitis/bursitis, osteitis pubis, lumbar radiculopathy, lymphadenitis do not think this is GI etiology I.e diverticulitis which can radiate to the groin- Plan: MR HIP LEFT WO CONTRAST, MR PELVIS WO CONTRAST, oxyCODONE-acetaminophen (PERCOCET) 5-325 MG tablet 1-2 bid prn #20 no refills  DG HIP UNILAT WITH PELVIS MIN 4 VIEWS LEFT   HM Flu shot given today  -will need Tdap and,shingrix in future  pna 23 had 03/10/16  S/p hysterectomy no cervix and f/u OB/GYNh/o abnormal pap s/p cryo  -pap 05/08/15 neg papwill Repeat In 5 years   Colonoscopy Dr. Allen Norris 01/2017 polyps, gastritis, diverticulosis  Mammogram3/18/2020 negative  DEXAneg 10/13/2018  Provider: Dr. Olivia Mackie [name]-Internal Medicine

## 2019-03-31 NOTE — Telephone Encounter (Signed)
Rx has been received and is in Dr. Alva Garnet folder to sign. Rhonda J Cobb

## 2019-04-01 NOTE — Telephone Encounter (Signed)
Rx signed and sent back. Confirmation received.  HST was then requested by company and this was sent to them.  Pt has been contacted and advised that the above process has been completed and if she does not hear from them by Wed 04/06/2019 to call me back and I will follow up. Pt voiced understanding. Rhonda J Cobb

## 2019-04-06 ENCOUNTER — Encounter
Admission: RE | Admit: 2019-04-06 | Discharge: 2019-04-06 | Disposition: A | Discharge status: Home / Self Care | Payer: Medicare HMO | Source: Ambulatory Visit | Attending: Orthopedic Surgery | Admitting: Orthopedic Surgery

## 2019-04-06 ENCOUNTER — Other Ambulatory Visit: Payer: Self-pay

## 2019-04-06 ENCOUNTER — Encounter: Payer: Self-pay | Admitting: Nurse Practitioner

## 2019-04-06 ENCOUNTER — Ambulatory Visit (INDEPENDENT_AMBULATORY_CARE_PROVIDER_SITE_OTHER): Payer: Medicare HMO | Admitting: Nurse Practitioner

## 2019-04-06 VITALS — BP 120/80 | HR 61 | Ht 65.0 in | Wt 211.0 lb

## 2019-04-06 DIAGNOSIS — I255 Ischemic cardiomyopathy: Secondary | ICD-10-CM | POA: Diagnosis not present

## 2019-04-06 DIAGNOSIS — Z01812 Encounter for preprocedural laboratory examination: Secondary | ICD-10-CM | POA: Diagnosis present

## 2019-04-06 DIAGNOSIS — E785 Hyperlipidemia, unspecified: Secondary | ICD-10-CM

## 2019-04-06 DIAGNOSIS — I2510 Atherosclerotic heart disease of native coronary artery without angina pectoris: Secondary | ICD-10-CM

## 2019-04-06 DIAGNOSIS — I10 Essential (primary) hypertension: Secondary | ICD-10-CM

## 2019-04-06 DIAGNOSIS — I5042 Chronic combined systolic (congestive) and diastolic (congestive) heart failure: Secondary | ICD-10-CM | POA: Diagnosis not present

## 2019-04-06 DIAGNOSIS — Z01810 Encounter for preprocedural cardiovascular examination: Secondary | ICD-10-CM | POA: Diagnosis not present

## 2019-04-06 LAB — PROTIME-INR
INR: 0.9 (ref 0.8–1.2)
Prothrombin Time: 12.2 seconds (ref 11.4–15.2)

## 2019-04-06 LAB — SURGICAL PCR SCREEN
MRSA, PCR: NEGATIVE
Staphylococcus aureus: NEGATIVE

## 2019-04-06 LAB — URINALYSIS, ROUTINE W REFLEX MICROSCOPIC
Bilirubin Urine: NEGATIVE
Glucose, UA: NEGATIVE mg/dL
Hgb urine dipstick: NEGATIVE
Ketones, ur: NEGATIVE mg/dL
Leukocytes,Ua: NEGATIVE
Nitrite: NEGATIVE
Protein, ur: NEGATIVE mg/dL
Specific Gravity, Urine: 1.006 (ref 1.005–1.030)
pH: 6 (ref 5.0–8.0)

## 2019-04-06 LAB — BASIC METABOLIC PANEL
Anion gap: 12 (ref 5–15)
BUN: 13 mg/dL (ref 6–20)
CO2: 26 mmol/L (ref 22–32)
Calcium: 9.7 mg/dL (ref 8.9–10.3)
Chloride: 104 mmol/L (ref 98–111)
Creatinine, Ser: 0.84 mg/dL (ref 0.44–1.00)
GFR calc Af Amer: >60 mL/min (ref >60)
GFR calc non Af Amer: >60 mL/min (ref >60)
Glucose, Bld: 86 mg/dL (ref 70–99)
Potassium: 3.3 mmol/L — ABNORMAL LOW (ref 3.5–5.1)
Sodium: 142 mmol/L (ref 135–145)

## 2019-04-06 LAB — CBC
HCT: 39.9 % (ref 36.0–46.0)
Hemoglobin: 13.1 g/dL (ref 12.0–15.0)
MCH: 31.2 pg (ref 26.0–34.0)
MCHC: 32.8 g/dL (ref 30.0–36.0)
MCV: 95 fL (ref 80.0–100.0)
Platelets: 184 10*3/uL (ref 150–400)
RBC: 4.2 MIL/uL (ref 3.87–5.11)
RDW: 13.6 % (ref 11.5–15.5)
WBC: 7.3 10*3/uL (ref 4.0–10.5)
nRBC: 0 % (ref 0.0–0.2)

## 2019-04-06 LAB — APTT: aPTT: 30 seconds (ref 24–36)

## 2019-04-06 LAB — TYPE AND SCREEN
ABO/RH(D): B POS
Antibody Screen: NEGATIVE

## 2019-04-06 LAB — SEDIMENTATION RATE: Sed Rate: 21 mm/hr (ref 0–30)

## 2019-04-06 MED ORDER — ASPIRIN EC 81 MG PO TBEC: 81.0000 mg | DELAYED_RELEASE_TABLET | Freq: Every day | ORAL | Status: DC

## 2019-04-06 NOTE — Patient Instructions (Signed)
Medication Instructions:  Your physician has recommended you make the following change in your medication:   REDUCE Aspirin to 81mg  daily  If you need a refill on your cardiac medications before your next appointment, please call your pharmacy.   Lab work: None ordered If you have labs (blood work) drawn today and your tests are completely normal, you will receive your results only by: [name] MyChart Message (if you have MyChart) OR . A paper copy in the mail If you have any lab test that is abnormal or we need to change your treatment, we will call you to review the results.  Testing/Procedures: None ordered  Follow-Up: At [facility], you and your health needs are our priority.  As part of our continuing mission to provide you with exceptional heart care, we have created designated Provider Care Teams.  These Care Teams include your primary Cardiologist (physician) and Advanced Practice Providers (APPs -  Physician Assistants and Nurse Practitioners) who all work together to provide you with the care you need, when you need it. You will need a follow up appointment in 6 months.  Please call our office 2 months in advance to schedule this appointment.  You may see Ida Rogue, MD or one of the following Advanced Practice Providers on your designated Care Team:   Murray Hodgkins, NP Christell Faith, PA-C . Marrianne Mood, PA-C  Any Other Special Instructions Will Be Listed Below (If Applicable). N/A

## 2019-04-06 NOTE — Patient Instructions (Addendum)
Your COVID19 swab is scheduled on: Monday April 11, 2019 8-10:30am in front of the [facility] [place]  Your procedure is scheduled on: Thursday April 14, 2019 Report to Same Day Surgery 2nd floor [facility] Entrance-take elevator on left to 2nd floor.  Check in with surgery information desk.) To find out your arrival time, call [phone] 1:00-3:00 PM on Wednesday April 13, 2019  Remember: Instructions that are not followed completely may result in serious medical risk, up to and including death, or upon the discretion of your surgeon and anesthesiologist your surgery may need to be rescheduled.    __x__ 1. Do not eat food (including mints, candies, chewing gum) after midnight the night before your procedure. You may drink water up to 2 hours before you are scheduled to arrive at the hospital for your procedure.  Do not drink anything within 2 hours of your scheduled arrival to the hospital.    __x__ 2. No Alcohol for 24 hours before or after surgery.   __x__ 3. No Smoking or e-cigarettes for 24 hours before surgery.  Do not use any chewable tobacco products for at least 6 hours before surgery.   __x__ 4. Notify your doctor if there is any change in your medical condition (cold, fever, infections).   __x__ 5. On the morning of surgery brush your teeth with toothpaste and water.  You may rinse your mouth with mouthwash if you wish.  Do not swallow any toothpaste or mouthwash.  Please read over the following fact sheets that you were given:   [facility] Preparing for Surgery and/or MRSA Information    __x__ Use CHG Soap as directed on instruction sheet.   Do not wear jewelry, make-up, hairpins, clips or nail polish on the day of surgery.  Do not wear lotions, powders, deodorant, or perfumes.   Do not shave below the face/neck 48 hours prior to surgery.   Do not bring valuables to the hospital.    [facility] is not responsible for any  belongings or valuables.               Contacts, dentures or bridgework may not be worn into surgery.  Leave your suitcase in the car. After surgery it may be brought to your room.  For patients admitted to the hospital, discharge time is determined by your treatment team.  For patients discharged on the day of surgery, you will NOT be permitted to drive yourself home.  You must have a responsible adult with you for 24 hours after surgery.  __x__ Take these medicines on the morning of surgery with a SMALL SIP OF WATER:  1. MECLIZINE  2.METOPROLOL  3.PAROXETINE  4.NEXIUM TAKE DOSE THE NIGHT BEFORE AND THE MORNING OF  5.ISOSORBIDE  6.ZETIA   DO NOT TAKE LOSARTAN ON THE DAY OF SURGERY  __x__ Use inhalers on the day of surgery AND FLONASE   ___x_ Bring C-Pap/Bi-Pap machine to the hospital.   ___x_ Stop Metformin t 2 days before surgery (Last dose   04-11-2019).    __x__ Follow recommendations from Cardiologist, Pulmonologist or PCP regarding stopping Aspirin. XERALTO LAST Monday.  __x__ TODAY: Stop Anti-inflammatories such as Advil, Ibuprofen, Motrin, Aleve, Naproxen, Naprosyn, BC/Goodies powders or aspirin products. You may continue to take Tylenol and Celebrex.   __x__ TODAY: Stop supplements until after surgery. You may continue to take Vitamin D, Vitamin B, and multivitamin.

## 2019-04-06 NOTE — Progress Notes (Signed)
Office Visit    Patient Name: [name] Date of Encounter: 04/06/2019  Primary Care Provider:  [name], [name], MD Primary Cardiologist:  Ida Rogue, MD  Chief Complaint    58 year old female with a history of CAD status post CABG, chronic combined systolic diastolic congestive heart failure, ischemic cardiomyopathy, hypertension, hyperlipidemia, diabetes, COPD, peripheral arterial disease, and sleep apnea, who presents for follow-up and preoperative evaluation pending right total knee arthroplasty.   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 being followed by neurosurgery/eye doctor and pcp. monitoring size.causes sinus problems   Bronchitis    Cardiac arrhythmia    Cervical neck pain with evidence of disc disease    C5/6 disease, MRI done late 2012 - no records available   Chronic combined systolic and diastolic CHF (congestive heart failure) ([place])    a. 08/2010 Echo: mildly reduced EF 40-45%, mild diffuse hypokinesis; b. 06/2016 Echo: EF 50%, no rwma, mild to mod TR; c. 03/2017 Echo: EF 40-45%, Gr1 DD (prior echo reviewed and EF felt to be lower than reported).   Chronic pain    Cocaine abuse, in remission (HCC)    clean x 24 years   Colon polyps    COPD (chronic obstructive pulmonary disease) ([place])    a. 12/2018 PFT: No obvious obst/restrictive dzs.   Coronary artery disease    a. PCI of LCX 2003; b. PCI of the LAD 2012 with a (2.5 x 8 mm BMS);  c.s/p CABG 4/12: L-LAD, VG-Dx, VG-OM, VG-RCA (Dr. Prescott Gum);  d. 01/2012 MV: inf infarct, attenuation, no ischemia; e. 02/2017 MV: signifi attenuation artifact. Fixed basal antlat/inflat scar vs artifact. Reversible apical lat and mid antlat defect - ? atten vs ischemia. F/u echo w/o wma->Med rx.    Depression    Diabetes mellitus without complication ([place])    Family history of colon cancer    Generalized headaches    frequent   GERD (gastroesophageal reflux disease)    Headache    Heart murmur    Hematochezia    Hepatitis    history of hepatitis b   History of cervical cancer    s/p cryotherapy   History of drug abuse ([place])    cocaine, marijuana, clean since 1989   History of hepatitis B    from eating undercooked liver   History of MI (myocardial infarction)    Hyperlipidemia    Hypertension    Ischemic cardiomyopathy    a. 03/2017 Echo: EF 40-45%, Gr1 DD.   Myocardial infarction ([place]) 2003, 2012   PAD (peripheral artery disease) ([place])    a. s/p Right SFA atherectomy and PTA 01/15/11; b. 07/2018 ABI: R 1.02, L 1.09.   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 Mass abuts R cavernous sinus w/o definite invasion.   Polyp of colon    Seasonal allergies    Sleep apnea    uses cpap   Smoking history    quit 07/2010   Thyroid disease    Urinary incontinence    Past Surgical History:  Procedure Laterality Date   ABDOMINAL HYSTERECTOMY     2003   CHOLECYSTECTOMY  1986   COLONOSCOPY  2008   3 polyps   COLONOSCOPY WITH PROPOFOL N/A 02/06/2015   Procedure: COLONOSCOPY WITH PROPOFOL;  Surgeon: Lucilla Lame, MD;  Location: [facility] ENDOSCOPY;  Service: Endoscopy;  Laterality: N/A;   COLONOSCOPY WITH PROPOFOL N/A 01/27/2017   Procedure: COLONOSCOPY WITH PROPOFOL;  Surgeon: Lucilla Lame, MD;  Location: [facility] ENDOSCOPY;  Service: Endoscopy;  Laterality: N/A;   CORONARY ANGIOPLASTY     w/ stent placement x2   CORONARY ARTERY BYPASS GRAFT  2012   Dr [name]   CORONARY STENT PLACEMENT  2003   S/P MI   CORONARY STENT PLACEMENT  2007   [place]   ESOPHAGOGASTRODUODENOSCOPY (EGD) WITH PROPOFOL N/A 02/06/2015   Procedure: ESOPHAGOGASTRODUODENOSCOPY (EGD) WITH PROPOFOL;  Surgeon: Lucilla Lame, MD;  Location: [facility]  ENDOSCOPY;  Service: Endoscopy;  Laterality: N/A;   ESOPHAGOGASTRODUODENOSCOPY (EGD) WITH PROPOFOL N/A 01/27/2017   Procedure: ESOPHAGOGASTRODUODENOSCOPY (EGD) WITH PROPOFOL;  Surgeon: Lucilla Lame, MD;  Location: [facility] ENDOSCOPY;  Service: Endoscopy;  Laterality: N/A;   FEMORAL ARTERY STENT  10/2010   right sided (Dr. Burt Knack)   KNEE ARTHROSCOPY WITH MEDIAL MENISECTOMY Right 08/20/2017   Procedure: KNEE ARTHROSCOPY WITH MEDIAL  AND LATERAL MENISECTOMY;  Surgeon: Hessie Knows, MD;  Location: [facility] ORS;  Service: Orthopedics;  Laterality: Right;   POSTERIOR CERVICAL LAMINECTOMY N/A 03/08/2018   Procedure: POSTERIOR CERVICAL LAMINECTOMY-C7;  Surgeon: Deetta Perla, MD;  Location: [facility] ORS;  Service: Neurosurgery;  Laterality: N/A;   TUBAL LIGATION      Allergies  Allergies  Allergen Reactions   Latex Rash        Shellfish Allergy Anaphylaxis    Hard shellfish/swelling in throat   Sulfonamide Derivatives Anaphylaxis    Swelling in throat.   Watermelon [Citrullus Vulgaris] Other (See Comments)    Throat itchy   Soy Allergy     Diarrhea/upset stomach     Other Swelling    History of Present Illness    58 year old female with the above complex past medical history including coronary artery disease status post CABG times 4 in 2012, chronic combined systolic and diastolic congestive heart failure, ischemic cardiomyopathy, hypertension, hyperlipidemia, diabetes, COPD, peripheral arterial disease status post right SFA atherectomy and PTA in June 2012, and sleep apnea.  She last underwent stress testing in September 2018 in preparation for right knee surgery in the setting of medial meniscus tear.  The sensitivity and specificity of the study was limited by significant artifact however, there were no large areas of ischemia.  A follow-up echocardiogram showed stable, mild LV dysfunction with an EF of 40 to 45%, and she was cleared for surgery, which took place 07/2017.  In the setting of  multi-bed vascular dzs, low dose xarelto (2.5mg  bid) was added to her regimen in 2019.  She was last seen by Dr. [name] in Feb of this year, at which time lisinopril was d/c'd 2/2 cough and she was started on losartan.   Since her last visit here, she has done reasonably well from a cardiac standpoint.  She occasionally notes a sharp and shooting pain that stems from the epigastric area and up into her left chest.  This is very brief in nature, lasting just a second or so.  This is been going on ever since her bypass surgery.  She otherwise does not experience chest or left arm pain (anginal equivalent).  She has some degree of chronic dyspnea on exertion which has not significantly changed.  Ambulation has been a bit of a struggle in  the setting of worsening right knee and lower leg pain in general and is because of progressive degenerative joint disease, that she is now pending right total knee arthroplasty later this month.  She denies palpitations, PND, orthopnea, dizziness, syncope, edema, or early satiety.  She is able to walk up 21 steps in her home without chest pain or dyspnea and also continues to do light housework, only having to break secondary to knee pain.  Home Medications    Prior to Admission medications   Medication Sig Start Date End Date Taking? Authorizing Provider  albuterol (PROVENTIL HFA;VENTOLIN HFA) 108 (90 Base) MCG/ACT inhaler Inhale 2 puffs into the lungs every 6 (six) hours as needed for wheezing or shortness of breath. 04/10/16   Wilhelmina Mcardle, MD  aspirin EC 81 MG tablet Take 81 mg by mouth 2 (two) times daily.    [provider]  Azelastine HCl 0.15 % SOLN Place 2 sprays into both nostrils daily as needed (allergies).  10/31/16   [provider]  esomeprazole (NEXIUM) 40 MG capsule Take 1 capsule (40 mg total) by mouth daily. Reported on 09/04/2015 01/05/19   [name], [name], MD  Evolocumab (REPATHA SURECLICK) [id number] MG/ML SOAJ Inject 140 mg into the  skin every 14 (fourteen) days. 09/16/18   Minna Merritts, MD  ezetimibe (ZETIA) 10 MG tablet Take 1 tablet (10 mg total) by mouth daily. 12/29/18   Minna Merritts, MD  fluticasone (FLONASE) 50 MCG/ACT nasal spray Place 2 sprays into both nostrils daily as needed for allergies.     [provider]  fluticasone (FLOVENT HFA) 110 MCG/ACT inhaler Inhale 1 puff into the lungs 2 (two) times a day. Rinse mouth 11/29/18   [name], [name], MD  furosemide (LASIX) 20 MG tablet TAKE 1 TABLET BY MOUTH ONCE DAILY AND  A  SECOND  TABLET  IF  NEEDED  FOR  FLUID  BUILD  UP Patient taking differently: Take 20 mg by mouth See admin instructions. Take 1 tablet (20 mg) by mouth once daily in the morning, may repeat dose if needed for fluid build up 02/25/19   Minna Merritts, MD  gabapentin (NEURONTIN) 300 MG capsule Take 300 mg by mouth daily.  11/11/18 11/11/19  [provider]  glucose blood (ONE TOUCH ULTRA TEST) test strip Use as instructed check cbg qd E11.9 04/05/18   [name], [name], MD  ipratropium-albuterol (DUONEB) 0.5-2.5 (3) MG/3ML SOLN Take 3 mLs by nebulization 2 (two) times daily as needed. Patient taking differently: Take 3 mLs by nebulization 2 (two) times daily as needed (wheezing/shortness of breath).  07/09/18   [name], [name], MD  isosorbide mononitrate (IMDUR) 30 MG 24 hr tablet TAKE 1 TABLET BY MOUTH TWICE DAILY . APPOINTMENT REQUIRED FOR FUTURE REFILLS Patient taking differently: Take 30 mg by mouth 2 (two) times daily.  11/01/18   Minna Merritts, MD  Lactobacillus (ACIDOPHILUS PROBIOTIC) 10 MG CAPS Take 1 capsule by mouth daily. Patient taking differently: Take 1 capsule by mouth at bedtime.  07/12/18   Jodelle Green, FNP  Lancets MISC 1 Device by Does not apply route daily. Lancets E11.9 04/05/18   [name], [name], MD  levocetirizine (XYZAL) 5 MG tablet Take 1 tablet (5 mg total) by mouth at bedtime as needed for allergies. 08/26/18    [name], [name], MD  losartan (COZAAR) 25 MG tablet Take 1 tablet (25 mg total) by mouth daily. Patient taking differently: Take 25 mg by mouth  every evening.  03/10/19 06/08/19  [name], [name], MD  meclizine (ANTIVERT) 25 MG tablet Take 25 mg by mouth 3 (three) times daily.  01/05/18   [provider]  metFORMIN (GLUCOPHAGE) 500 MG tablet Take 1 tablet (500 mg total) by mouth daily with breakfast. 03/10/19   [name], [name], MD  metoprolol tartrate (LOPRESSOR) 25 MG tablet Take 1 tablet (25 mg total) by mouth 2 (two) times daily. 03/14/19   [name], [name], MD  mirabegron ER (MYRBETRIQ) 25 MG TB24 tablet Take 1 tablet (25 mg total) by mouth daily. Patient taking differently: Take 25 mg by mouth daily as needed (urinary frequency).  01/21/19   [name], [name], MD  mupirocin ointment (BACTROBAN) 2 % Apply 1 application topically 2 (two) times daily. Prn scalp x 1 week and nose x 5 days Patient taking differently: Apply 1 application topically 2 (two) times daily as needed (scalp/nose irritation.).  08/26/18   [name], [name], MD  nitroGLYCERIN (NITROSTAT) 0.4 MG SL tablet Place 1 tablet (0.4 mg total) under the tongue every 5 (five) minutes as needed. Patient taking differently: Place 0.4 mg under the tongue every 5 (five) minutes as needed for chest pain.  07/10/17   Minna Merritts, MD  oxyCODONE-acetaminophen (PERCOCET) 5-325 MG tablet Take 1-2 tablets by mouth 2 (two) times daily as needed for severe pain. 03/29/19   [name], [name], MD  PARoxetine (PAXIL) 10 MG tablet Take 1 tablet (10 mg total) by mouth daily. 03/14/19   [name], [name], MD  potassium chloride (K-DUR,KLOR-CON) 10 MEQ tablet Take 1 tablet (10 mEq total) by mouth daily. 09/01/18   Minna Merritts, MD  psyllium (METAMUCIL SMOOTH TEXTURE) 28 % packet Take 1 packet by mouth 2 (two) times daily.    [provider]  rivaroxaban (XARELTO) 2.5 MG TABS  tablet Take 1 tablet (2.5 mg total) by mouth 2 (two) times daily. 03/15/19   Minna Merritts, MD  rosuvastatin (CRESTOR) 20 MG tablet Take 1 tablet (20 mg total) by mouth at bedtime. 08/26/18   [name], [name], MD  senna-docusate (SENOKOT-S) 8.6-50 MG tablet Take 1 tablet by mouth daily as needed for mild constipation or moderate constipation. Patient not taking: Reported on 04/05/2019 11/29/18   [name], [name], MD  traMADol (ULTRAM) 50 MG tablet Take 1-2 tablets (50-100 mg total) by mouth every 6 (six) hours as needed. Patient not taking: Reported on 04/05/2019 03/10/19   [name], [name], MD  umeclidinium-vilanterol (ANORO ELLIPTA) 62.5-25 MCG/INH AEPB Inhale 1 puff into the lungs daily. 12/21/18   [name], [name], MD  Wheat Dextrin (BENEFIBER) POWD 2 teaspoons added to 4-8 ounces of liquid or food up to 3x per day prn Patient not taking: Reported on 04/05/2019 11/29/18   [name], [name], MD    Review of Systems    Occas, brief/fleeting epigastric and left chest shooting pain - similar to what she has experienced since her CABG.  Chronic DOE.  Ambulation has been difficult in the setting of worsening right knee and lower leg pain.  No palpitations, pnd, orthopnea, n, v, dizziness, syncope, edema, weight gain, or early satiety.  All other systems reviewed and are otherwise negative except as noted above.  Physical Exam    VS:  BP 120/80 (BP Location: Right Arm, Patient Position: Sitting, Cuff Size: Large)    Pulse 61    Ht 5\' 5"  (1.651 m)    Wt 211 lb (95.7 kg)    SpO2  98%    BMI 35.11 kg/m  , BMI Body mass index is 35.11 kg/m. GEN: Well nourished, well developed, in no acute distress. HEENT: normal. Neck: Supple, no JVD, carotid bruits, or masses. Cardiac: RRR, no murmurs, rubs, or gallops. No clubbing, cyanosis, edema.  Radials/PT 1+ and equal bilaterally.  Respiratory:  Respirations regular and unlabored, clear to auscultation bilaterally. GI: Soft,  nontender, nondistended, BS + x 4. MS: no deformity or atrophy. Skin: warm and dry, no rash. Neuro:  Strength and sensation are intact. Psych: Normal affect.  Accessory Clinical Findings    ECG personally reviewed by me today - RSR w/ sinus arrhythmia, inf, antlat twi  - no acute changes.  Lab Results  Component Value Date   WBC 6.9 10/05/2018   HGB 12.7 10/05/2018   HCT 37.6 10/05/2018   MCV 92.4 10/05/2018   PLT 178.0 10/05/2018   Lab Results  Component Value Date   CREATININE 0.85 02/07/2019   BUN 15 02/07/2019   NA 143 02/07/2019   K 3.9 02/07/2019   CL 111 02/07/2019   CO2 25 02/07/2019   Lab Results  Component Value Date   ALT 17 02/07/2019   AST 15 02/07/2019   ALKPHOS 58 02/07/2019   BILITOT 0.3 02/07/2019   Lab Results  Component Value Date   CHOL 149 12/14/2018   HDL 51 12/14/2018   LDLCALC 81 12/14/2018   TRIG 87 12/14/2018   CHOLHDL 2.9 12/14/2018     Assessment & Plan    1.  Coronary artery disease/preoperative cardiovascular examination: Status post CABG x4 in 2012.  Equivocal stress testing in September 2018, though no large areas of ischemia noted.  She has been stable since her last visit here and has not had any anginal symptoms.  She has some degree of chronic sharp and fleeting chest pain that has been present ever since her bypass surgery.  She has chronic, stable dyspnea on exertion.  She notes that she is able to walk up 21 steps in her home and also do light housework.  She is scheduled for right total knee arthroplasty on September 17.  Preoperative risk of a major cardiac event is 0.9% and in that setting, she is low risk for cardiac complications and thus does not require any further preoperative ischemic testing prior to surgery.  I recommend continuation of aspirin and beta-blocker therapy throughout the perioperative period.  She is on low-dose Xarelto, which she should begin holding at least 3 days prior to surgery and can resume  postoperatively when felt to be safe from a surgical perspective.  Recommend continuation nitrate, Zetia, and Repatha.  2.  Essential hypertension: Stable on beta-blocker and ARB therapy.  3.  Hyperlipidemia: Now on Zetia and Repatha.  Intolerant to statins.  She notes that she occasionally misses doses of Repatha and missed a dose recently.  Last LDL in May (81) was actually 2 points higher than what her LDL was in February.  She thinks that perhaps when it was checked in May she had not yet started Repatha.  Plan to follow-up lipids at next visit considering she missed a dose of Repatha recently.  4.  Type 2 diabetes mellitus: On metformin with an A1c of 6.5 in July.  Followed by primary care.  5.  Chronic combined systolic and diastolic congestive heart failure/ischemic cardiomyopathy: EF 40 to 45% with grade 1 diastolic dysfunction by echocardiogram last year.  Euvolemic on examination with stable heart rate and blood pressure.  Continue  beta-blocker, ARB, and Lasix therapy.  6.  Peripheral arterial disease: Stable ABIs in January.  Continue aspirin, Zetia, and Repatha.  7.  Degenerative joint disease: Pending right total knee arthroplasty.  As above, low risk for planned surgery and she does not require any additional ischemic testing at this time.  Continue aspirin and beta-blocker throughout the perioperative period.  She may hold Xarelto beginning at least 3 days before prior to surgery.  8.  Disposition: Follow-up in clinic in 6 months or sooner if necessary.   Murray Hodgkins, NP 04/06/2019, 12:15 PM

## 2019-04-07 NOTE — Pre-Procedure Instructions (Signed)
Faxed copy of abnormal K+ to Olivia at [facility].

## 2019-04-08 LAB — URINE CULTURE: Culture: <10000 — AB

## 2019-04-11 ENCOUNTER — Other Ambulatory Visit: Payer: Self-pay

## 2019-04-11 ENCOUNTER — Other Ambulatory Visit
Admission: RE | Admit: 2019-04-11 | Discharge: 2019-04-11 | Disposition: A | Discharge status: Home / Self Care | Payer: Medicare HMO | Source: Ambulatory Visit | Attending: Orthopedic Surgery | Admitting: Orthopedic Surgery

## 2019-04-11 DIAGNOSIS — Z01812 Encounter for preprocedural laboratory examination: Secondary | ICD-10-CM | POA: Insufficient documentation

## 2019-04-11 DIAGNOSIS — Z20828 Contact with and (suspected) exposure to other viral communicable diseases: Secondary | ICD-10-CM | POA: Insufficient documentation

## 2019-04-11 LAB — SARS CORONAVIRUS 2 (TAT 6-24 HRS): SARS Coronavirus 2: NEGATIVE

## 2019-04-13 ENCOUNTER — Other Ambulatory Visit: Payer: Self-pay | Admitting: Internal Medicine

## 2019-04-13 DIAGNOSIS — J449 Chronic obstructive pulmonary disease, unspecified: Secondary | ICD-10-CM

## 2019-04-13 MED ORDER — ALBUTEROL SULFATE HFA 108 (90 BASE) MCG/ACT IN AERS: 2.0000 | INHALATION_SPRAY | Freq: Four times a day (QID) | RESPIRATORY_TRACT | 12 refills | Status: DC | PRN

## 2019-04-14 ENCOUNTER — Encounter
Admission: RE | Disposition: A | Discharge status: Home / Self Care | Payer: Self-pay | Source: Home / Self Care | Attending: Orthopedic Surgery

## 2019-04-14 ENCOUNTER — Inpatient Hospital Stay: Payer: Medicare HMO

## 2019-04-14 ENCOUNTER — Other Ambulatory Visit: Payer: Self-pay

## 2019-04-14 ENCOUNTER — Encounter: Payer: Self-pay | Admitting: Anesthesiology

## 2019-04-14 ENCOUNTER — Inpatient Hospital Stay
Admission: RE | Admit: 2019-04-14 | Discharge: 2019-04-16 | DRG: 470 | Disposition: A | Discharge status: Home / Self Care | Payer: Medicare HMO | Attending: Orthopedic Surgery | Admitting: Orthopedic Surgery

## 2019-04-14 DIAGNOSIS — G4730 Sleep apnea, unspecified: Secondary | ICD-10-CM | POA: Diagnosis not present

## 2019-04-14 DIAGNOSIS — Z96651 Presence of right artificial knee joint: Secondary | ICD-10-CM

## 2019-04-14 DIAGNOSIS — I110 Hypertensive heart disease with heart failure: Secondary | ICD-10-CM | POA: Diagnosis not present

## 2019-04-14 DIAGNOSIS — J449 Chronic obstructive pulmonary disease, unspecified: Secondary | ICD-10-CM | POA: Diagnosis present

## 2019-04-14 DIAGNOSIS — Z91018 Allergy to other foods: Secondary | ICD-10-CM

## 2019-04-14 DIAGNOSIS — Z471 Aftercare following joint replacement surgery: Secondary | ICD-10-CM | POA: Diagnosis not present

## 2019-04-14 DIAGNOSIS — I5022 Chronic systolic (congestive) heart failure: Secondary | ICD-10-CM | POA: Diagnosis present

## 2019-04-14 DIAGNOSIS — I2510 Atherosclerotic heart disease of native coronary artery without angina pectoris: Secondary | ICD-10-CM | POA: Diagnosis present

## 2019-04-14 DIAGNOSIS — Z955 Presence of coronary angioplasty implant and graft: Secondary | ICD-10-CM | POA: Diagnosis not present

## 2019-04-14 DIAGNOSIS — K219 Gastro-esophageal reflux disease without esophagitis: Secondary | ICD-10-CM | POA: Diagnosis present

## 2019-04-14 DIAGNOSIS — G8918 Other acute postprocedural pain: Secondary | ICD-10-CM

## 2019-04-14 DIAGNOSIS — E1151 Type 2 diabetes mellitus with diabetic peripheral angiopathy without gangrene: Secondary | ICD-10-CM | POA: Diagnosis present

## 2019-04-14 DIAGNOSIS — Z8619 Personal history of other infectious and parasitic diseases: Secondary | ICD-10-CM | POA: Diagnosis not present

## 2019-04-14 DIAGNOSIS — E119 Type 2 diabetes mellitus without complications: Secondary | ICD-10-CM | POA: Diagnosis not present

## 2019-04-14 DIAGNOSIS — E785 Hyperlipidemia, unspecified: Secondary | ICD-10-CM | POA: Diagnosis present

## 2019-04-14 DIAGNOSIS — Z8541 Personal history of malignant neoplasm of cervix uteri: Secondary | ICD-10-CM

## 2019-04-14 DIAGNOSIS — Z888 Allergy status to other drugs, medicaments and biological substances status: Secondary | ICD-10-CM

## 2019-04-14 DIAGNOSIS — Z951 Presence of aortocoronary bypass graft: Secondary | ICD-10-CM

## 2019-04-14 DIAGNOSIS — Z800 Family history of malignant neoplasm of digestive organs: Secondary | ICD-10-CM | POA: Diagnosis not present

## 2019-04-14 DIAGNOSIS — Z87891 Personal history of nicotine dependence: Secondary | ICD-10-CM

## 2019-04-14 DIAGNOSIS — I255 Ischemic cardiomyopathy: Secondary | ICD-10-CM | POA: Diagnosis present

## 2019-04-14 DIAGNOSIS — Z91013 Allergy to seafood: Secondary | ICD-10-CM | POA: Diagnosis not present

## 2019-04-14 DIAGNOSIS — Z20828 Contact with and (suspected) exposure to other viral communicable diseases: Secondary | ICD-10-CM | POA: Diagnosis not present

## 2019-04-14 DIAGNOSIS — I509 Heart failure, unspecified: Secondary | ICD-10-CM | POA: Diagnosis not present

## 2019-04-14 DIAGNOSIS — I252 Old myocardial infarction: Secondary | ICD-10-CM

## 2019-04-14 DIAGNOSIS — Z6834 Body mass index (BMI) 34.0-34.9, adult: Secondary | ICD-10-CM | POA: Diagnosis not present

## 2019-04-14 DIAGNOSIS — J302 Other seasonal allergic rhinitis: Secondary | ICD-10-CM | POA: Diagnosis present

## 2019-04-14 DIAGNOSIS — Z882 Allergy status to sulfonamides status: Secondary | ICD-10-CM | POA: Diagnosis not present

## 2019-04-14 DIAGNOSIS — M1711 Unilateral primary osteoarthritis, right knee: Secondary | ICD-10-CM | POA: Diagnosis not present

## 2019-04-14 HISTORY — PX: TOTAL KNEE ARTHROPLASTY: SHX125

## 2019-04-14 HISTORY — DX: Presence of right artificial knee joint: Z96.651

## 2019-04-14 LAB — URINE DRUG SCREEN, QUALITATIVE (ARMC ONLY)
Amphetamines, Ur Screen: NOT DETECTED
Barbiturates, Ur Screen: NOT DETECTED
Benzodiazepine, Ur Scrn: NOT DETECTED
Cannabinoid 50 Ng, Ur ~~LOC~~: POSITIVE — AB
Cocaine Metabolite,Ur ~~LOC~~: NOT DETECTED
MDMA (Ecstasy)Ur Screen: NOT DETECTED
Methadone Scn, Ur: NOT DETECTED
Opiate, Ur Screen: NOT DETECTED
Phencyclidine (PCP) Ur S: NOT DETECTED
Tricyclic, Ur Screen: NOT DETECTED

## 2019-04-14 LAB — POCT I-STAT, CHEM 8
BUN: 18 mg/dL (ref 6–20)
Calcium, Ion: 1.18 mmol/L (ref 1.15–1.40)
Chloride: 109 mmol/L (ref 98–111)
Creatinine, Ser: 0.9 mg/dL (ref 0.44–1.00)
Glucose, Bld: 109 mg/dL — ABNORMAL HIGH (ref 70–99)
HCT: 38 % (ref 36.0–46.0)
Hemoglobin: 12.9 g/dL (ref 12.0–15.0)
Potassium: 3.6 mmol/L (ref 3.5–5.1)
Sodium: 144 mmol/L (ref 135–145)
TCO2: 23 mmol/L (ref 22–32)

## 2019-04-14 LAB — GLUCOSE, CAPILLARY
Glucose-Capillary: 118 mg/dL — ABNORMAL HIGH (ref 70–99)
Glucose-Capillary: 92 mg/dL (ref 70–99)
Glucose-Capillary: 90 mg/dL (ref 70–99)
Glucose-Capillary: 91 mg/dL (ref 70–99)
Glucose-Capillary: 102 mg/dL — ABNORMAL HIGH (ref 70–99)

## 2019-04-14 SURGERY — ARTHROPLASTY, KNEE, TOTAL
Anesthesia: Spinal | Site: Knee | Laterality: Right

## 2019-04-14 MED ORDER — FENTANYL CITRATE (PF) 100 MCG/2ML IJ SOLN
INTRAMUSCULAR | Status: DC | PRN
  Administered 2019-04-14 (×2): 25 ug via INTRAVENOUS
  Administered 2019-04-14: 50 ug via INTRAVENOUS

## 2019-04-14 MED ORDER — FENTANYL CITRATE (PF) 100 MCG/2ML IJ SOLN: 25.0000 ug | INTRAMUSCULAR | Status: DC | PRN

## 2019-04-14 MED ORDER — MORPHINE SULFATE (PF) 10 MG/ML IV SOLN
INTRAVENOUS | Status: AC
  Filled 2019-04-14: qty 1

## 2019-04-14 MED ORDER — BUPIVACAINE-EPINEPHRINE (PF) 0.25% -1:200000 IJ SOLN
INTRAMUSCULAR | Status: AC
  Filled 2019-04-14: qty 30

## 2019-04-14 MED ORDER — ACETAMINOPHEN 500 MG PO TABS
1000.0000 mg | ORAL_TABLET | Freq: Four times a day (QID) | ORAL | Status: AC
  Administered 2019-04-14 – 2019-04-15 (×4): 1000 mg via ORAL
  Filled 2019-04-14 (×4): qty 2

## 2019-04-14 MED ORDER — BUPIVACAINE-EPINEPHRINE (PF) 0.25% -1:200000 IJ SOLN
INTRAMUSCULAR | Status: DC | PRN
  Administered 2019-04-14: 30 mL

## 2019-04-14 MED ORDER — ALBUTEROL SULFATE (2.5 MG/3ML) 0.083% IN NEBU: 2.5000 mg | INHALATION_SOLUTION | Freq: Four times a day (QID) | RESPIRATORY_TRACT | Status: DC | PRN

## 2019-04-14 MED ORDER — PROPOFOL 500 MG/50ML IV EMUL
INTRAVENOUS | Status: DC | PRN
  Administered 2019-04-14: 130 ug/kg/min via INTRAVENOUS

## 2019-04-14 MED ORDER — SODIUM CHLORIDE 0.9 % IV SOLN
INTRAVENOUS | Status: DC
  Administered 2019-04-14: 07:00:00 via INTRAVENOUS

## 2019-04-14 MED ORDER — METOCLOPRAMIDE HCL 10 MG PO TABS: 5.0000 mg | ORAL_TABLET | Freq: Three times a day (TID) | ORAL | Status: DC | PRN

## 2019-04-14 MED ORDER — ALBUTEROL SULFATE HFA 108 (90 BASE) MCG/ACT IN AERS: 2.0000 | INHALATION_SPRAY | Freq: Four times a day (QID) | RESPIRATORY_TRACT | Status: DC | PRN

## 2019-04-14 MED ORDER — PROPOFOL 10 MG/ML IV BOLUS
INTRAVENOUS | Status: AC
  Filled 2019-04-14: qty 20

## 2019-04-14 MED ORDER — METOPROLOL TARTRATE 25 MG PO TABS
25.0000 mg | ORAL_TABLET | Freq: Two times a day (BID) | ORAL | Status: DC
  Administered 2019-04-14 – 2019-04-16 (×4): 25 mg via ORAL
  Filled 2019-04-14 (×4): qty 1

## 2019-04-14 MED ORDER — POTASSIUM CHLORIDE CRYS ER 10 MEQ PO TBCR
10.0000 meq | EXTENDED_RELEASE_TABLET | Freq: Every day | ORAL | Status: DC
  Administered 2019-04-14 – 2019-04-16 (×3): 10 meq via ORAL
  Filled 2019-04-14 (×3): qty 1

## 2019-04-14 MED ORDER — PAROXETINE HCL 10 MG PO TABS
10.0000 mg | ORAL_TABLET | Freq: Every day | ORAL | Status: DC
  Administered 2019-04-15 – 2019-04-16 (×2): 10 mg via ORAL
  Filled 2019-04-14 (×2): qty 1

## 2019-04-14 MED ORDER — CETIRIZINE HCL 10 MG PO TABS
5.0000 mg | ORAL_TABLET | Freq: Every evening | ORAL | Status: DC | PRN
  Filled 2019-04-14: qty 1

## 2019-04-14 MED ORDER — SODIUM CHLORIDE FLUSH 0.9 % IV SOLN
INTRAVENOUS | Status: AC
  Filled 2019-04-14: qty 40

## 2019-04-14 MED ORDER — SODIUM CHLORIDE 0.9 % IV SOLN
INTRAVENOUS | Status: DC | PRN
  Administered 2019-04-14: 40 ug/min via INTRAVENOUS

## 2019-04-14 MED ORDER — BUPIVACAINE HCL (PF) 0.5 % IJ SOLN
INTRAMUSCULAR | Status: DC | PRN
  Administered 2019-04-14: 3 mL

## 2019-04-14 MED ORDER — ONDANSETRON HCL 4 MG/2ML IJ SOLN
INTRAMUSCULAR | Status: AC
  Filled 2019-04-14: qty 2

## 2019-04-14 MED ORDER — EZETIMIBE 10 MG PO TABS
10.0000 mg | ORAL_TABLET | Freq: Every day | ORAL | Status: DC
  Administered 2019-04-15 – 2019-04-16 (×2): 10 mg via ORAL
  Filled 2019-04-14 (×2): qty 1

## 2019-04-14 MED ORDER — MUPIROCIN 2 % EX OINT
1.0000 "application " | TOPICAL_OINTMENT | Freq: Two times a day (BID) | CUTANEOUS | Status: DC
  Administered 2019-04-14 – 2019-04-16 (×4): 1 via TOPICAL
  Filled 2019-04-14: qty 22

## 2019-04-14 MED ORDER — METOCLOPRAMIDE HCL 5 MG/ML IJ SOLN: 5.0000 mg | Freq: Three times a day (TID) | INTRAMUSCULAR | Status: DC | PRN

## 2019-04-14 MED ORDER — DOCUSATE SODIUM 100 MG PO CAPS
100.0000 mg | ORAL_CAPSULE | Freq: Two times a day (BID) | ORAL | Status: DC
  Administered 2019-04-14 – 2019-04-16 (×4): 100 mg via ORAL
  Filled 2019-04-14 (×4): qty 1

## 2019-04-14 MED ORDER — BUPIVACAINE LIPOSOME 1.3 % IJ SUSP
INTRAMUSCULAR | Status: AC
  Filled 2019-04-14: qty 20

## 2019-04-14 MED ORDER — INSULIN ASPART 100 UNIT/ML ~~LOC~~ SOLN
0.0000 [IU] | Freq: Three times a day (TID) | SUBCUTANEOUS | Status: DC
  Administered 2019-04-16: 09:00:00 2 [IU] via SUBCUTANEOUS
  Filled 2019-04-14: qty 1

## 2019-04-14 MED ORDER — CEFAZOLIN SODIUM-DEXTROSE 2-4 GM/100ML-% IV SOLN
2.0000 g | Freq: Once | INTRAVENOUS | Status: AC
  Administered 2019-04-14: 2 g via INTRAVENOUS

## 2019-04-14 MED ORDER — ROSUVASTATIN CALCIUM 10 MG PO TABS
20.0000 mg | ORAL_TABLET | Freq: Every day | ORAL | Status: DC
  Administered 2019-04-14 – 2019-04-15 (×2): 20 mg via ORAL
  Filled 2019-04-14 (×2): qty 2

## 2019-04-14 MED ORDER — CEFAZOLIN SODIUM-DEXTROSE 2-4 GM/100ML-% IV SOLN
INTRAVENOUS | Status: AC
  Filled 2019-04-14: qty 100

## 2019-04-14 MED ORDER — PANTOPRAZOLE SODIUM 40 MG PO TBEC
80.0000 mg | DELAYED_RELEASE_TABLET | Freq: Every day | ORAL | Status: DC
  Administered 2019-04-15 – 2019-04-16 (×2): 80 mg via ORAL
  Filled 2019-04-14 (×2): qty 2

## 2019-04-14 MED ORDER — NITROGLYCERIN 0.4 MG SL SUBL: 0.4000 mg | SUBLINGUAL_TABLET | SUBLINGUAL | Status: DC | PRN

## 2019-04-14 MED ORDER — GLYCOPYRROLATE 0.2 MG/ML IJ SOLN
INTRAMUSCULAR | Status: DC | PRN
  Administered 2019-04-14: 0.2 mg via INTRAVENOUS

## 2019-04-14 MED ORDER — PROMETHAZINE HCL 25 MG/ML IJ SOLN: 6.2500 mg | INTRAMUSCULAR | Status: DC | PRN

## 2019-04-14 MED ORDER — DIPHENHYDRAMINE HCL 12.5 MG/5ML PO ELIX: 12.5000 mg | ORAL_SOLUTION | ORAL | Status: DC | PRN

## 2019-04-14 MED ORDER — DEXAMETHASONE SODIUM PHOSPHATE 10 MG/ML IJ SOLN
INTRAMUSCULAR | Status: DC | PRN
  Administered 2019-04-14: 4 mg via INTRAVENOUS

## 2019-04-14 MED ORDER — SODIUM CHLORIDE 0.9 % IV SOLN
INTRAVENOUS | Status: DC | PRN
  Administered 2019-04-14: 60 mL

## 2019-04-14 MED ORDER — METHOCARBAMOL 1000 MG/10ML IJ SOLN
500.0000 mg | Freq: Four times a day (QID) | INTRAVENOUS | Status: DC | PRN
  Filled 2019-04-14: qty 5

## 2019-04-14 MED ORDER — DEXAMETHASONE SODIUM PHOSPHATE 10 MG/ML IJ SOLN
INTRAMUSCULAR | Status: AC
  Filled 2019-04-14: qty 1

## 2019-04-14 MED ORDER — PROPOFOL 500 MG/50ML IV EMUL
INTRAVENOUS | Status: AC
  Filled 2019-04-14: qty 50

## 2019-04-14 MED ORDER — ONDANSETRON HCL 4 MG/2ML IJ SOLN: 4.0000 mg | Freq: Four times a day (QID) | INTRAMUSCULAR | Status: DC | PRN

## 2019-04-14 MED ORDER — FENTANYL CITRATE (PF) 100 MCG/2ML IJ SOLN
INTRAMUSCULAR | Status: AC
  Filled 2019-04-14: qty 2

## 2019-04-14 MED ORDER — ACETAMINOPHEN 325 MG PO TABS
325.0000 mg | ORAL_TABLET | Freq: Four times a day (QID) | ORAL | Status: DC | PRN
  Administered 2019-04-15: 650 mg via ORAL
  Filled 2019-04-14: qty 2

## 2019-04-14 MED ORDER — AZELASTINE HCL 0.1 % NA SOLN
2.0000 | Freq: Every day | NASAL | Status: DC | PRN
  Filled 2019-04-14: qty 30

## 2019-04-14 MED ORDER — MIRABEGRON ER 25 MG PO TB24
25.0000 mg | ORAL_TABLET | Freq: Every day | ORAL | Status: DC | PRN
  Administered 2019-04-14: 25 mg via ORAL
  Filled 2019-04-14 (×2): qty 1

## 2019-04-14 MED ORDER — MIDAZOLAM HCL 5 MG/5ML IJ SOLN
INTRAMUSCULAR | Status: DC | PRN
  Administered 2019-04-14: 2 mg via INTRAVENOUS

## 2019-04-14 MED ORDER — ONDANSETRON HCL 4 MG PO TABS
4.0000 mg | ORAL_TABLET | Freq: Four times a day (QID) | ORAL | Status: DC | PRN
  Administered 2019-04-16: 4 mg via ORAL
  Filled 2019-04-14: qty 1

## 2019-04-14 MED ORDER — LIDOCAINE HCL (CARDIAC) PF 100 MG/5ML IV SOSY
PREFILLED_SYRINGE | INTRAVENOUS | Status: DC | PRN
  Administered 2019-04-14: 100 mg via INTRAVENOUS

## 2019-04-14 MED ORDER — ONDANSETRON HCL 4 MG/2ML IJ SOLN
INTRAMUSCULAR | Status: DC | PRN
  Administered 2019-04-14: 4 mg via INTRAVENOUS

## 2019-04-14 MED ORDER — MAGNESIUM CITRATE PO SOLN
1.0000 | Freq: Once | ORAL | Status: AC | PRN
  Administered 2019-04-16: 09:00:00 1 via ORAL
  Filled 2019-04-14 (×2): qty 296

## 2019-04-14 MED ORDER — MIDAZOLAM HCL 2 MG/2ML IJ SOLN
INTRAMUSCULAR | Status: AC
  Filled 2019-04-14: qty 2

## 2019-04-14 MED ORDER — BUDESONIDE 0.25 MG/2ML IN SUSP
0.2500 mg | Freq: Two times a day (BID) | RESPIRATORY_TRACT | Status: DC
  Administered 2019-04-14 – 2019-04-16 (×4): 0.25 mg via RESPIRATORY_TRACT
  Filled 2019-04-14 (×4): qty 2

## 2019-04-14 MED ORDER — IPRATROPIUM-ALBUTEROL 0.5-2.5 (3) MG/3ML IN SOLN: 3.0000 mL | Freq: Two times a day (BID) | RESPIRATORY_TRACT | Status: DC | PRN

## 2019-04-14 MED ORDER — BISACODYL 5 MG PO TBEC
5.0000 mg | DELAYED_RELEASE_TABLET | Freq: Every day | ORAL | Status: DC | PRN
  Administered 2019-04-15: 19:00:00 5 mg via ORAL
  Filled 2019-04-14: qty 1

## 2019-04-14 MED ORDER — NEOMYCIN-POLYMYXIN B GU 40-200000 IR SOLN
Status: AC
  Filled 2019-04-14: qty 1

## 2019-04-14 MED ORDER — ZOLPIDEM TARTRATE 5 MG PO TABS
5.0000 mg | ORAL_TABLET | Freq: Every evening | ORAL | Status: DC | PRN
  Administered 2019-04-15: 5 mg via ORAL
  Filled 2019-04-14: qty 1

## 2019-04-14 MED ORDER — ALUM & MAG HYDROXIDE-SIMETH 200-200-20 MG/5ML PO SUSP: 30.0000 mL | ORAL | Status: DC | PRN

## 2019-04-14 MED ORDER — BUPIVACAINE HCL (PF) 0.5 % IJ SOLN
INTRAMUSCULAR | Status: AC
  Filled 2019-04-14: qty 10

## 2019-04-14 MED ORDER — MAGNESIUM HYDROXIDE 400 MG/5ML PO SUSP: 30.0000 mL | Freq: Every day | ORAL | Status: DC | PRN

## 2019-04-14 MED ORDER — EVOLOCUMAB 140 MG/ML ~~LOC~~ SOAJ: 140.0000 mg | SUBCUTANEOUS | Status: DC

## 2019-04-14 MED ORDER — PHENOL 1.4 % MT LIQD
1.0000 | OROMUCOSAL | Status: DC | PRN
  Filled 2019-04-14: qty 177

## 2019-04-14 MED ORDER — OXYCODONE HCL 5 MG PO TABS
5.0000 mg | ORAL_TABLET | ORAL | Status: DC | PRN
  Administered 2019-04-14: 10 mg via ORAL
  Administered 2019-04-14: 5 mg via ORAL
  Administered 2019-04-15 (×2): 10 mg via ORAL
  Administered 2019-04-15 (×2): 5 mg via ORAL
  Filled 2019-04-14: qty 2
  Filled 2019-04-14: qty 1
  Filled 2019-04-14: qty 2
  Filled 2019-04-14: qty 1
  Filled 2019-04-14: qty 2
  Filled 2019-04-14: qty 1

## 2019-04-14 MED ORDER — PHENYLEPHRINE HCL (PRESSORS) 10 MG/ML IV SOLN
INTRAVENOUS | Status: DC | PRN
  Administered 2019-04-14: 100 ug via INTRAVENOUS

## 2019-04-14 MED ORDER — ASPIRIN EC 81 MG PO TBEC
81.0000 mg | DELAYED_RELEASE_TABLET | Freq: Every day | ORAL | Status: DC
  Administered 2019-04-14 – 2019-04-16 (×3): 81 mg via ORAL
  Filled 2019-04-14 (×3): qty 1

## 2019-04-14 MED ORDER — LOSARTAN POTASSIUM 25 MG PO TABS
25.0000 mg | ORAL_TABLET | Freq: Every evening | ORAL | Status: DC
  Administered 2019-04-14 – 2019-04-15 (×2): 25 mg via ORAL
  Filled 2019-04-14 (×2): qty 1

## 2019-04-14 MED ORDER — RISAQUAD PO CAPS
1.0000 | ORAL_CAPSULE | Freq: Every day | ORAL | Status: DC
  Administered 2019-04-14 – 2019-04-16 (×3): 1 via ORAL
  Filled 2019-04-14 (×4): qty 1

## 2019-04-14 MED ORDER — MORPHINE SULFATE 10 MG/ML IJ SOLN
INTRAMUSCULAR | Status: DC | PRN
  Administered 2019-04-14: 10 mg

## 2019-04-14 MED ORDER — TRAMADOL HCL 50 MG PO TABS
50.0000 mg | ORAL_TABLET | Freq: Four times a day (QID) | ORAL | Status: DC
  Administered 2019-04-14 – 2019-04-16 (×9): 50 mg via ORAL
  Filled 2019-04-14 (×9): qty 1

## 2019-04-14 MED ORDER — METFORMIN HCL 500 MG PO TABS
500.0000 mg | ORAL_TABLET | Freq: Every day | ORAL | Status: DC
  Administered 2019-04-15 – 2019-04-16 (×2): 500 mg via ORAL
  Filled 2019-04-14 (×2): qty 1

## 2019-04-14 MED ORDER — UMECLIDINIUM-VILANTEROL 62.5-25 MCG/INH IN AEPB
1.0000 | INHALATION_SPRAY | Freq: Every day | RESPIRATORY_TRACT | Status: DC
  Administered 2019-04-14 – 2019-04-16 (×3): 1 via RESPIRATORY_TRACT
  Filled 2019-04-14 (×2): qty 14

## 2019-04-14 MED ORDER — MENTHOL 3 MG MT LOZG
1.0000 | LOZENGE | OROMUCOSAL | Status: DC | PRN
  Filled 2019-04-14: qty 9

## 2019-04-14 MED ORDER — METHOCARBAMOL 500 MG PO TABS
500.0000 mg | ORAL_TABLET | Freq: Four times a day (QID) | ORAL | Status: DC | PRN
  Administered 2019-04-14 – 2019-04-15 (×6): 500 mg via ORAL
  Filled 2019-04-14 (×6): qty 1

## 2019-04-14 MED ORDER — SODIUM CHLORIDE 0.9 % IV SOLN
INTRAVENOUS | Status: DC
  Administered 2019-04-14 – 2019-04-15 (×3): via INTRAVENOUS

## 2019-04-14 MED ORDER — RIVAROXABAN 2.5 MG PO TABS
2.5000 mg | ORAL_TABLET | Freq: Two times a day (BID) | ORAL | Status: DC
  Administered 2019-04-15 – 2019-04-16 (×3): 2.5 mg via ORAL
  Filled 2019-04-14 (×3): qty 1

## 2019-04-14 MED ORDER — FLUTICASONE PROPIONATE 50 MCG/ACT NA SUSP
2.0000 | Freq: Every day | NASAL | Status: DC | PRN
  Filled 2019-04-14: qty 16

## 2019-04-14 MED ORDER — LIDOCAINE HCL (PF) 2 % IJ SOLN
INTRAMUSCULAR | Status: AC
  Filled 2019-04-14: qty 10

## 2019-04-14 MED ORDER — ISOSORBIDE MONONITRATE ER 30 MG PO TB24
30.0000 mg | ORAL_TABLET | Freq: Two times a day (BID) | ORAL | Status: DC
  Administered 2019-04-14 – 2019-04-16 (×4): 30 mg via ORAL
  Filled 2019-04-14 (×4): qty 1

## 2019-04-14 MED ORDER — MECLIZINE HCL 25 MG PO TABS
25.0000 mg | ORAL_TABLET | Freq: Three times a day (TID) | ORAL | Status: DC
  Administered 2019-04-14 – 2019-04-16 (×7): 25 mg via ORAL
  Filled 2019-04-14 (×9): qty 1

## 2019-04-14 MED ORDER — FUROSEMIDE 20 MG PO TABS
20.0000 mg | ORAL_TABLET | Freq: Every day | ORAL | Status: DC
  Administered 2019-04-14 – 2019-04-16 (×3): 20 mg via ORAL
  Filled 2019-04-14 (×3): qty 1

## 2019-04-14 MED ORDER — HYDROMORPHONE HCL 1 MG/ML IJ SOLN
0.5000 mg | INTRAMUSCULAR | Status: DC | PRN
  Administered 2019-04-15 (×2): 1 mg via INTRAVENOUS
  Filled 2019-04-14 (×2): qty 1

## 2019-04-14 MED ORDER — NEOMYCIN-POLYMYXIN B GU 40-200000 IR SOLN
Status: DC | PRN
  Administered 2019-04-14: 16 mL

## 2019-04-14 MED ORDER — OXYCODONE HCL 5 MG PO TABS
10.0000 mg | ORAL_TABLET | ORAL | Status: DC | PRN
  Administered 2019-04-14: 10 mg via ORAL
  Administered 2019-04-15 – 2019-04-16 (×5): 15 mg via ORAL
  Filled 2019-04-14 (×4): qty 3
  Filled 2019-04-14: qty 2
  Filled 2019-04-14: qty 3

## 2019-04-14 MED ORDER — CEFAZOLIN SODIUM-DEXTROSE 2-4 GM/100ML-% IV SOLN
2.0000 g | Freq: Four times a day (QID) | INTRAVENOUS | Status: AC
  Administered 2019-04-14 (×2): 2 g via INTRAVENOUS
  Filled 2019-04-14 (×2): qty 100

## 2019-04-14 SURGICAL SUPPLY — 70 items
BLADE SAGITTAL 25.0X1.19X90 (BLADE) ×2 IMPLANT
BLOCK CUTTING FEMUR 3+ RT MED (MISCELLANEOUS) ×1 IMPLANT
BLOCK CUTTING TIBIAL 3 RT (MISCELLANEOUS) ×1 IMPLANT
BLOCK CUTTING TIBIAL 4 RT MIS (MISCELLANEOUS) ×1 IMPLANT
BNDG ELASTIC 6X5.8 VLCR STR LF (GAUZE/BANDAGES/DRESSINGS) ×2 IMPLANT
CANISTER SUCT 1200ML W/VALVE (MISCELLANEOUS) ×2 IMPLANT
CANISTER SUCT 3000ML PPV (MISCELLANEOUS) ×4 IMPLANT
CANISTER WOUND CARE 500ML ATS (WOUND CARE) ×2 IMPLANT
CEMENT HV SMART SET (Cement) ×3 IMPLANT
CHLORAPREP W/TINT 26 (MISCELLANEOUS) ×4 IMPLANT
COOLER POLAR GLACIER W/PUMP (MISCELLANEOUS) ×2 IMPLANT
COVER WAND RF STERILE (DRAPES) ×2 IMPLANT
CUFF TOURN SGL QUICK 24 (TOURNIQUET CUFF)
CUFF TOURN SGL QUICK 30 (TOURNIQUET CUFF)
CUFF TRNQT CYL 24X4X16.5-23 (TOURNIQUET CUFF) IMPLANT
CUFF TRNQT CYL 30X4X21-28X (TOURNIQUET CUFF) IMPLANT
DRAPE 3/4 80X56 (DRAPES) ×4 IMPLANT
ELECT CAUTERY BLADE 6.4 (BLADE) ×2 IMPLANT
ELECT REM PT RETURN 9FT ADLT (ELECTROSURGICAL) ×2
ELECTRODE REM PT RTRN 9FT ADLT (ELECTROSURGICAL) ×1 IMPLANT
FEMORAL COMP SZ3P RT SPHERE (Femur) ×1 IMPLANT
FEMUR BONE MODEL (MISCELLANEOUS) ×1 IMPLANT
GAUZE SPONGE 4X4 12PLY STRL (GAUZE/BANDAGES/DRESSINGS) ×2 IMPLANT
GAUZE XEROFORM 1X8 LF (GAUZE/BANDAGES/DRESSINGS) ×2 IMPLANT
GLOVE BIOGEL PI IND STRL 9 (GLOVE) ×1 IMPLANT
GLOVE BIOGEL PI INDICATOR 9 (GLOVE) ×1
GLOVE INDICATOR 8.0 STRL GRN (GLOVE) ×2 IMPLANT
GLOVE SURG ORTHO 8.0 STRL STRW (GLOVE) ×2 IMPLANT
GLOVE SURG SYN 9.0  PF PI (GLOVE) ×1
GLOVE SURG SYN 9.0 PF PI (GLOVE) ×1 IMPLANT
GOWN SRG 2XL LVL 4 RGLN SLV (GOWNS) ×1 IMPLANT
GOWN STRL NON-REIN 2XL LVL4 (GOWNS) ×1
GOWN STRL REUS W/ TWL LRG LVL3 (GOWN DISPOSABLE) ×1 IMPLANT
GOWN STRL REUS W/ TWL XL LVL3 (GOWN DISPOSABLE) ×1 IMPLANT
GOWN STRL REUS W/TWL LRG LVL3 (GOWN DISPOSABLE) ×1
GOWN STRL REUS W/TWL XL LVL3 (GOWN DISPOSABLE) ×1
HOLDER FOLEY CATH W/STRAP (MISCELLANEOUS) ×2 IMPLANT
HOOD PEEL AWAY FLYTE STAYCOOL (MISCELLANEOUS) ×4 IMPLANT
INSERT TIBIAL FIXED 14MM RT (Insert) ×1 IMPLANT
KIT PREVENA INCISION MGT20CM45 (CANNISTER) ×2 IMPLANT
KIT TURNOVER KIT A (KITS) ×2 IMPLANT
NDL SAFETY ECLIPSE 18X1.5 (NEEDLE) ×1 IMPLANT
NDL SPNL 18GX3.5 QUINCKE PK (NEEDLE) ×1 IMPLANT
NDL SPNL 20GX3.5 QUINCKE YW (NEEDLE) ×1 IMPLANT
NEEDLE HYPO 18GX1.5 SHARP (NEEDLE) ×1
NEEDLE SPNL 18GX3.5 QUINCKE PK (NEEDLE) ×2 IMPLANT
NEEDLE SPNL 20GX3.5 QUINCKE YW (NEEDLE) ×2 IMPLANT
NS IRRIG 1000ML POUR BTL (IV SOLUTION) ×2 IMPLANT
PACK TOTAL KNEE (MISCELLANEOUS) ×2 IMPLANT
PAD WRAPON POLAR KNEE (MISCELLANEOUS) ×1 IMPLANT
PATELLA RESURFACING MEDACTA 02 (Bone Implant) ×1 IMPLANT
PULSAVAC PLUS IRRIG FAN TIP (DISPOSABLE) ×2
SCALPEL PROTECTED #10 DISP (BLADE) ×4 IMPLANT
SOL .9 NS 3000ML IRR  AL (IV SOLUTION) ×1
SOL .9 NS 3000ML IRR UROMATIC (IV SOLUTION) ×1 IMPLANT
STAPLER SKIN PROX 35W (STAPLE) ×2 IMPLANT
STEM EXTENSION 11MMX30MM (Stem) ×1 IMPLANT
SUCTION FRAZIER HANDLE 10FR (MISCELLANEOUS) ×1
SUCTION TUBE FRAZIER 10FR DISP (MISCELLANEOUS) ×1 IMPLANT
SUT DVC 2 QUILL PDO  T11 36X36 (SUTURE) ×1
SUT DVC 2 QUILL PDO T11 36X36 (SUTURE) ×1 IMPLANT
SUT V-LOC 90 ABS DVC 3-0 CL (SUTURE) ×2 IMPLANT
SYR 20ML LL LF (SYRINGE) ×2 IMPLANT
SYR 50ML LL SCALE MARK (SYRINGE) ×4 IMPLANT
TIP FAN IRRIG PULSAVAC PLUS (DISPOSABLE) ×1 IMPLANT
TOWEL OR 17X26 4PK STRL BLUE (TOWEL DISPOSABLE) ×2 IMPLANT
TOWER CARTRIDGE SMART MIX (DISPOSABLE) ×2 IMPLANT
TRAY FOLEY MTR SLVR 16FR STAT (SET/KITS/TRAYS/PACK) ×2 IMPLANT
TRAY TIB FX RT SZ 3 (Joint) ×1 IMPLANT
WRAPON POLAR PAD KNEE (MISCELLANEOUS) ×2

## 2019-04-14 NOTE — H&P (Signed)
Reviewed paper H+P, will be scanned into chart. No changes noted.  

## 2019-04-14 NOTE — Progress Notes (Signed)
   04/14/19 1300  Clinical Encounter Type  Visited With Patient  Visit Type Initial  Referral From Nurse  Ch went in for an AD education. Pt wants her sisters and daughters to be HCPOA. Ch gave education on how to fill out and notarize the document. Pt plans to go to a notary that she knows upon discharge. Pt praised the hospital and its staff for their great team work and care. Pt thanked the hospital for her pleasant experience and said she tries to stay positive and spread that positivity. Ch greatly enjoyed this encounter and pt also appreciated the visit. Ch let her know that chaplain service is available at all times.

## 2019-04-14 NOTE — Transfer of Care (Signed)
Immediate Anesthesia Transfer of Care Note  Patient: [name]  Procedure(s) Performed: RIGHT TOTAL KNEE ARTHROPLASTY (Right Knee)  Patient Location: PACU  Anesthesia Type:MAC and Spinal  Level of Consciousness: awake and oriented  Airway & Oxygen Therapy: Patient connected to face mask oxygen  Post-op Assessment: Report given to RN and Post -op Vital signs reviewed and stable  Post vital signs: Reviewed and stable  Last Vitals:  Vitals Value Taken Time  BP 143/125 04/14/19 0926  Temp    Pulse 84 04/14/19 0928  Resp 23 04/14/19 0928  SpO2 95 % 04/14/19 0928  Vitals shown include unvalidated device data.  Last Pain:  Vitals:   04/14/19 0615  TempSrc: Temporal  PainSc: 0-No pain         Complications: No apparent anesthesia complications

## 2019-04-14 NOTE — Op Note (Signed)
04/14/2019  9:28 AM  PATIENT:  [name]  58 y.o. female  PRE-OPERATIVE DIAGNOSIS:  PRIMARY OSTEOARTHRITIS OF RIGHT KNEE  POST-OPERATIVE DIAGNOSIS:  PRIMARY OSTEOARTHRITIS OF RIGHT KNEE  PROCEDURE:  Procedure(s): RIGHT TOTAL KNEE ARTHROPLASTY (Right)  SURGEON: Laurene Footman, MD  ASSISTANTS: Rachelle Hora, PA-C  ANESTHESIA:   spinal  EBL:  Total I/O In: 1000 [I.V.:1000] Out: 120 [Urine:100; Blood:20]  BLOOD ADMINISTERED:none  DRAINS: none   LOCAL MEDICATIONS USED:  MARCAINE    and OTHER morphine and Exparel  SPECIMEN:  No Specimen  DISPOSITION OF SPECIMEN:  N/A  COUNTS:  YES  TOURNIQUET:   Total Tourniquet Time Documented: Thigh (Right) - 66 minutes Total: Thigh (Right) - 66 minutes   IMPLANTS: Medacta GMK sphere system with 3+ right femur, 3 tibia, short stem, 14 mm insert, 2 patella, all components cemented  DICTATION: .Dragon Dictation  patient was brought to the operating room and after adequatespinalanesthesia was obtained therightleg was prepped and draped in the usual sterile fashion. After patient identification and timeout procedures were completed,  midline skin incision was made followed by medial parapatellar arthrotomyafter raising tourniquetthere was significant pitting and abnormality of the cartilage consistent with an inflammatory arthropathy.  Partial synovectomy was also carried out. The ACL and fat pad were excised off the anterior into the meniscus in the proximal tibia cutting guide from the [facility] was applied proximal tibia cut carried out. The distal femoral cut was carried out in a similar fashion.    The 3+cutting guide applied with anterior posterior and chamfer cuts made. The posterior horns of the menisci were removed at this point. The3tibia baseplate trial was placed pinned into position and proximal tibial preparation carried out with drilling hand reaming and the keel punch followed by placement of the3+femur and  sizing the tibial insert size 14 millimeter gave the best fit with stability and full extension. The distal femoral drill holes were made in the notch cut for the trochlear groove was then carried out with trials were then removed the patella was cut using the patellar cutting guide and it sized to a size2, afterdrill holes have been made.At this point the above local was infiltrated in the periarticular tissues and periosteum. The knee was irrigated with pulsatile lavage and the bony surfaces dried the tibial component was cemented into place first. Excess cement was removed and the polyethylene insert placed with a torque screw placed with a torque screwdriver tightened. The distal femoral component was placed and the knee was held in extension as the patellar button was clamped into place. After the cement was set excess cement was removed and the knee was again irrigated thoroughly thoroughly irrigated. The tourniquet was let down and hemostasis checkedwithelectrocautery.  Synovectomy was also carried out with extensive synovitis in the suprapatellar pouch and along the medial lateral gutters. The arthrotomy was repaired witha heavy Quill suture, followed by 3-0 V lock subcuticular closure,skin staples,incisional wound VACand Polar Care  PLAN OF CARE: Admit to inpatient   PATIENT DISPOSITION:  PACU - hemodynamically stable.

## 2019-04-14 NOTE — Anesthesia Preprocedure Evaluation (Signed)
Anesthesia Evaluation  Patient identified by MRN, date of birth, ID band Patient awake    Reviewed: Allergy & Precautions, NPO status , Patient's Chart, lab work & pertinent test results  History of Anesthesia Complications Negative for: history of anesthetic complications  Airway Mallampati: II  TM Distance: >3 FB Neck ROM: Full    Dental  (+) Dental Advidsory Given   Pulmonary asthma , sleep apnea and Continuous Positive Airway Pressure Ventilation , COPD,  COPD inhaler, former smoker,    breath sounds clear to auscultation- rhonchi (-) wheezing      Cardiovascular hypertension, Pt. on medications + CAD, + Past MI, + Cardiac Stents, + CABG (1992) and +CHF   Rhythm:Regular Rate:Normal - Systolic murmurs and - Diastolic murmurs Echo 0000000: - Left ventricle: The cavity size was normal. Wall thickness was   normal. Systolic function was mildly to moderately reduced. The   estimated ejection fraction was in the range of 40% to 45%.   Doppler parameters are consistent with abnormal left ventricular   relaxation (grade 1 diastolic dysfunction). - Pulmonary arteries: Systolic pressure was within the normal   range.   Neuro/Psych  Headaches, PSYCHIATRIC DISORDERS Depression    GI/Hepatic PUD, GERD  ,(+) Hepatitis -, B  Endo/Other  diabetes  Renal/GU negative Renal ROS     Musculoskeletal  (+) Arthritis ,   Abdominal (+) + obese,   Peds  Hematology  (+) Blood dyscrasia, anemia ,   Anesthesia Other Findings Past Medical History: No date: (HFpEF) heart failure with preserved ejection fraction ([place])     Comment:  a. 06/2016 Echo: EF 50%, no rwma, mild to mod TR. 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 being followed by  neurosurgery/eye               doctor and pcp. monitoring size.causes sinus problems No date: Bronchitis No date: Cardiac arrhythmia No date: Cervical neck pain with evidence of disc disease     Comment:  C5/6 disease, MRI done late 2012 - no records available No date: Chronic pain No date: Cocaine abuse, in remission (HCC)     Comment:  clean x 24 years No date: Colon polyps No date: COPD (chronic obstructive pulmonary disease) (HCC)     Comment:  many inhalers No date: Coronary artery disease     Comment:  a. PCI of LCX 2003; b. PCI of the LAD 2012 with a (2.5 x              8 mm BMS);  c.s/p CABG 4/12:  L-LAD, S-Dx, S-OM, S-RCA               (Dr. Prescott Gum);  d. 01/2012 MV: inf infarct, attenuation,              no ischemia. No date: Depression No date: Diabetes mellitus without complication (HCC) No date: Family history of colon cancer No date: Generalized headaches     Comment:  frequent No date: GERD (gastroesophageal reflux disease) No date: Headache No date: Heart murmur No date: Hematochezia No date: Hepatitis     Comment:  history of hepatitis b No date: History of cervical cancer     Comment:  s/p cryotherapy No date: History of drug abuse     Comment:  cocaine,  marijuana, clean since 1989 No date: History of hepatitis B     Comment:  from eating undercooked liver No date: History of MI (myocardial infarction) No date: Hyperlipidemia No date: Hypertension 2003, 2012: Myocardial infarction ([place]) No date: PAD (peripheral artery disease) ([place])     Comment:  s/p Right SFA atherectomy and PTA 01/15/11, normal ABIs               01/2011 No date: Polyp of colon No date: Seasonal allergies No date: Sleep apnea     Comment:  uses cpap No date: Smoking history     Comment:  quit [id number] No date: Systolic CHF, chronic (HCC)     Comment:  mild: echo 08/2010 - mildly reduced EF 40-45%, mild               diffuse hypokinesis No date: Thyroid disease No date: Urinary  incontinence No date: Urine incontinence   Reproductive/Obstetrics                             Anesthesia Physical  Anesthesia Plan  ASA: III  Anesthesia Plan: Spinal   Post-op Pain Management:    Induction: Intravenous  PONV Risk Score and Plan: 2 and TIVA and Propofol infusion  Airway Management Planned: Natural Airway and Simple Face Mask  Additional Equipment:   Intra-op Plan:   Post-operative Plan: Extubation in OR  Informed Consent: I have reviewed the patients History and Physical, chart, labs and discussed the procedure including the risks, benefits and alternatives for the proposed anesthesia with the patient or authorized representative who has indicated his/her understanding and acceptance.     Dental advisory given  Plan Discussed with: CRNA and Anesthesiologist  Anesthesia Plan Comments: (Neuromonitoring )        Anesthesia Quick Evaluation

## 2019-04-14 NOTE — Evaluation (Addendum)
Physical Therapy Evaluation Patient Details Name: [name] MRN: [id number] DOB: Sep 20, 1960 Today's Date: 04/14/2019   History of Present Illness  Pt is a 58 y/o F s/p R TKA (04/14/2019) with a PMH including CHF, COPD, DMII, HTN, CABG (2012), GERD, and hyperlipidemia.  Clinical Impression  Pt is a 58 y/o female s/p R TKA (04/14/2019). Pt was previously independent with use of cane and RW for stability secondary to pain. Pt tolerates session well today with reports of pain improving once out of bed. Pt able to perform bed mobility with supervision for safety, transfers (sit<>stand) and gait with CGA and RW. Pt requiring cuing for setup and sequencing with transfers and for RW management. Pt ambulates 40' with RW and able to demonstrate improved gait pattern with cuing, with improved L step length, though limiting R stance time secondary to pain.    Follow Up Recommendations Outpatient PT    Equipment Recommendations  Rolling walker with 5" wheels    Recommendations for Other Services       Precautions / Restrictions Precautions Precautions: Fall Restrictions Weight Bearing Restrictions: Yes RLE Weight Bearing: Weight bearing as tolerated      Mobility  Bed Mobility Overal bed mobility: Needs Assistance Bed Mobility: Supine to Sit     Supine to sit: Supervision        Transfers Overall transfer level: Needs assistance Equipment used: Rolling walker (2 wheeled) Transfers: Sit to/from Stand Sit to Stand: Min guard            Ambulation/Gait Ambulation/Gait assistance: Min guard Gait Distance (Feet): 40 Feet Assistive device: Rolling walker (2 wheeled) Gait Pattern/deviations: Step-to pattern;Decreased step length - left;Decreased stance time - right;Antalgic     General Gait Details: Pt able to progress to step through pattern with cuing and reports improvement in discomfort after ambulation compared to when in bed.  Stairs            Wheelchair  Mobility    Modified Rankin (Stroke Patients Only)       Balance Overall balance assessment: Needs assistance Sitting-balance support: Feet supported Sitting balance-Leahy Scale: Good     Standing balance support: Bilateral upper extremity supported Standing balance-Leahy Scale: Fair                               Pertinent Vitals/Pain Pain Assessment: 0-10 Pain Score: 10-Worst pain ever(reports 10/10 in bed; improves to 7/10 once out of bed, and 2/10 at end of session) Pain Location: R knee Pain Descriptors / Indicators: Aching;Discomfort;Tightness Pain Intervention(s): Limited activity within patient's tolerance;Repositioned;Ice applied    Home Living Family/patient expects to be discharged to:: Private residence Living Arrangements: Other relatives(sisters, grandson and daughter) Available Help at Discharge: Family;Available 24 hours/day(lives with sisters, grandson and daughter) Type of Home: House Home Access: Stairs to enter   Technical brewer of Steps: 2 Home Layout: Two level; bilateral handrails 1/2 way up; no handrails after Home Equipment: Bedside commode;Cane - single point;Walker - 4 wheels      Prior Function Level of Independence: Independent with assistive device(s)(using cane and RW (for longer distances) due to pain)               Hand Dominance        Extremity/Trunk Assessment   Upper Extremity Assessment Upper Extremity Assessment: Overall WFL for tasks assessed    Lower Extremity Assessment Lower Extremity Assessment: RLE deficits/detail;LLE deficits/detail RLE Deficits / Details: knee range  of motion: +5 to 80 RLE Sensation: WNL LLE Deficits / Details: range of motion WNLs for tasks assessed LLE Sensation: WNL       Communication   Communication: No difficulties  Cognition Arousal/Alertness: Awake/alert Behavior During Therapy: WFL for tasks assessed/performed Overall Cognitive Status: Within Functional  Limits for tasks assessed                                        General Comments      Exercises Total Joint Exercises Ankle Circles/Pumps: AROM;Both;20 reps Quad Sets: Strengthening;Right;10 reps Heel Slides: AROM;10 reps Goniometric ROM: +5 to 80 deg Marching in Standing: AROM;10 reps;Standing   Assessment/Plan    PT Assessment Patient needs continued PT services  PT Problem List Decreased strength;Decreased mobility;Decreased range of motion;Decreased activity tolerance;Decreased balance;Pain       PT Treatment Interventions DME instruction;Therapeutic activities;Gait training;Therapeutic exercise;Patient/family education;Stair training;Balance training;Functional mobility training;Neuromuscular re-education    PT Goals (Current goals can be found in the Care Plan section)  Acute Rehab PT Goals Patient Stated Goal: to get up and get things moving PT Goal Formulation: With patient Time For Goal Achievement: 04/28/19 Potential to Achieve Goals: Good    Frequency BID   Barriers to discharge        Co-evaluation               AM-PAC PT "6 Clicks" Mobility  Outcome Measure Help needed turning from your back to your side while in a flat bed without using bedrails?: None Help needed moving from lying on your back to sitting on the side of a flat bed without using bedrails?: None Help needed moving to and from a bed to a chair (including a wheelchair)?: A Little Help needed standing up from a chair using your arms (e.g., wheelchair or bedside chair)?: A Little Help needed to walk in hospital room?: A Little Help needed climbing 3-5 steps with a railing? : A Little 6 Click Score: 20    End of Session Equipment Utilized During Treatment: Gait belt Activity Tolerance: Patient tolerated treatment well;No increased pain Patient left: in chair;with chair alarm set;with call bell/phone within reach;with SCD's reapplied Nurse Communication: Mobility  status PT Visit Diagnosis: Unsteadiness on feet (R26.81);Muscle weakness (generalized) (M62.81);Difficulty in walking, not elsewhere classified (R26.2);Pain Pain - Right/Left: Right Pain - part of body: Knee    Time: 1351-1440 PT Time Calculation (min) (ACUTE ONLY): 49 min   Charges:     PT Treatments $Gait Training: 8-22 mins $Therapeutic Exercise: 8-22 mins        Petra Kuba, PT, DPT 04/14/19, 5:15 PM

## 2019-04-14 NOTE — Anesthesia Procedure Notes (Addendum)
Spinal  Patient location during procedure: OR Start time: 04/14/2019 7:15 AM End time: 04/14/2019 7:19 AM Staffing Resident/CRNA: Nelda Marseille, CRNA Performed: resident/CRNA  Preanesthetic Checklist Completed: patient identified, site marked, surgical consent, pre-op evaluation, timeout performed, IV checked, risks and benefits discussed and monitors and equipment checked Spinal Block Patient position: sitting Prep: Betadine Patient monitoring: heart rate, continuous pulse ox, blood pressure and cardiac monitor Approach: midline Location: L4-5 Injection technique: single-shot Needle Needle type: [place]acre and Introducer  Needle gauge: 24 G Needle length: 9 cm Assessment Sensory level: T10 Additional Notes Negative paresthesia. Negative blood return. Positive free-flowing CSF. Expiration date of kit checked and confirmed. Patient tolerated procedure well, without complications.

## 2019-04-14 NOTE — Anesthesia Post-op Follow-up Note (Signed)
Anesthesia QCDR form completed.        

## 2019-04-14 NOTE — TOC Progression Note (Signed)
Transition of Care [facility]) - Progression Note    Patient Details  Name: [name] MRN: [id number] Date of Birth: [birth year]  Transition of Care [facility]) CM/SW [place], RN Phone Number: 04/14/2019, 3:35 PM  Clinical Narrative:     Requested the price of Lovenox, will notify the patient once obtained       Expected Discharge Plan and Services                                                 Social Determinants of Health (SDOH) Interventions    Readmission Risk Interventions No flowsheet data found.

## 2019-04-15 ENCOUNTER — Encounter: Payer: Self-pay | Admitting: Orthopedic Surgery

## 2019-04-15 LAB — BASIC METABOLIC PANEL
Anion gap: 4 — ABNORMAL LOW (ref 5–15)
BUN: 14 mg/dL (ref 6–20)
CO2: 24 mmol/L (ref 22–32)
Calcium: 8.6 mg/dL — ABNORMAL LOW (ref 8.9–10.3)
Chloride: 111 mmol/L (ref 98–111)
Creatinine, Ser: 0.9 mg/dL (ref 0.44–1.00)
GFR calc Af Amer: >60 mL/min (ref >60)
GFR calc non Af Amer: >60 mL/min (ref >60)
Glucose, Bld: 117 mg/dL — ABNORMAL HIGH (ref 70–99)
Potassium: 3.7 mmol/L (ref 3.5–5.1)
Sodium: 139 mmol/L (ref 135–145)

## 2019-04-15 LAB — GLUCOSE, CAPILLARY
Glucose-Capillary: 104 mg/dL — ABNORMAL HIGH (ref 70–99)
Glucose-Capillary: 118 mg/dL — ABNORMAL HIGH (ref 70–99)
Glucose-Capillary: 107 mg/dL — ABNORMAL HIGH (ref 70–99)
Glucose-Capillary: 117 mg/dL — ABNORMAL HIGH (ref 70–99)

## 2019-04-15 LAB — CBC
HCT: 30.5 % — ABNORMAL LOW (ref 36.0–46.0)
Hemoglobin: 10 g/dL — ABNORMAL LOW (ref 12.0–15.0)
MCH: 31.3 pg (ref 26.0–34.0)
MCHC: 32.8 g/dL (ref 30.0–36.0)
MCV: 95.3 fL (ref 80.0–100.0)
Platelets: 148 10*3/uL — ABNORMAL LOW (ref 150–400)
RBC: 3.2 MIL/uL — ABNORMAL LOW (ref 3.87–5.11)
RDW: 14 % (ref 11.5–15.5)
WBC: 9 10*3/uL (ref 4.0–10.5)
nRBC: 0 % (ref 0.0–0.2)

## 2019-04-15 MED ORDER — METHOCARBAMOL 500 MG PO TABS: 500.0000 mg | ORAL_TABLET | Freq: Four times a day (QID) | ORAL | 0 refills | Status: DC | PRN

## 2019-04-15 MED ORDER — OXYCODONE HCL 5 MG PO TABS: 5.0000 mg | ORAL_TABLET | ORAL | 0 refills | Status: DC | PRN

## 2019-04-15 NOTE — TOC Benefit Eligibility Note (Signed)
Transition of Care [facility]) Benefit Eligibility Note    Patient Details  Name: [name] MRN: [id number] Date of Birth: 1961-05-25   Medication/Dose: Enoxaparin 38m once daily for 14 days  Covered?: Yes  Prescription Coverage Preferred Pharmacy: WDiannia Ruderwith Person/Company/Phone Number:: GBarnett Applebaumwith [employer] Medicare at [phone] Co-Pay: $0 estimated copay  Prior Approval: No  Deduct[place]: [place]: [phone] [phone] 24849/18/2020, 9:05 AM

## 2019-04-15 NOTE — Progress Notes (Signed)
Physical Therapy Treatment Patient Details Name: [name] MRN: [id number] DOB: Jan 26, 1961 Today's Date: 04/15/2019    History of Present Illness Pt is a 58 y/o F s/p R TKA (04/14/2019) with a PMH including CHF, COPD, DMII, HTN, CABG (2012), GERD, and hyperlipidemia.    PT Comments    Upon entry, pt is finishing up with OT and seated EOB. Pain is reported to be 7/10 on NPS, however she reports she was given medication prior to tx. Pt was ready to get up and move; session started with ambulation. Pt was able to ambulate ~100 feet to nurses station and back to recliner. Pt amb with step through pattern; decreased step length and antalgic pattern present on R LE. Slowed gait speed present 2/2 pain s/p R TKA. Following amb, pt was able to perform therex with supervision from PT; SLR required mod assist from PT due to pt guarding for pain. At end of session, pt reports that pain has increased to a 10, but she believes it will settle. Pt will continue to benefit from skilled physical therapy to address deficits in strength, ROM, balance, and pain management. Current recommendation for follow-up care remains OP PT; pt is progressing well in her ambulation distance and tolerance of exercise program. Pt also has adequate caregiver support at home.  Follow Up Recommendations  Outpatient PT     Equipment Recommendations  Rolling walker with 5" wheels    Recommendations for Other Services       Precautions / Restrictions Precautions Precautions: Fall;Knee Restrictions Weight Bearing Restrictions: Yes RLE Weight Bearing: Weight bearing as tolerated    Mobility  Bed Mobility Overal bed mobility: Needs Assistance Bed Mobility: Supine to Sit     Supine to sit: Min assist;Supervision     General bed mobility comments: Pt finishing with OT prior to PT session; pt was seated at EOB and returned to recliner following tx. Bed mobility not assessed.  Transfers Overall transfer level: Needs  assistance Equipment used: Rolling walker (2 wheeled) Transfers: Sit to/from Stand Sit to Stand: Min guard            Ambulation/Gait Ambulation/Gait assistance: Min guard Gait Distance (Feet): 100 Feet Assistive device: Rolling walker (2 wheeled) Gait Pattern/deviations: Decreased step length - left;Antalgic;Step-through pattern     General Gait Details: Pt amb with step through pattern; decreased step length and antalgic pattern present on R LE. Slowed gait speed present 2/2 pain s/p R TKA.   Stairs             Wheelchair Mobility    Modified Rankin (Stroke Patients Only)       Balance Overall balance assessment: Needs assistance Sitting-balance support: Feet supported;No upper extremity supported Sitting balance-Leahy Scale: Good Sitting balance - Comments: Good static and dynamic sitting during functional activity. Uses UE for weight shift to reposition self in bed.   Standing balance support: Bilateral upper extremity supported Standing balance-Leahy Scale: Good Standing balance comment: Pt able to maintain upright posture and balance while amb with support from RW                            Cognition Arousal/Alertness: Awake/alert Behavior During Therapy: [facility] for tasks assessed/performed Overall Cognitive Status: Within Functional Limits for tasks assessed  Exercises Total Joint Exercises Ankle Circles/Pumps: AROM;Both(12 reps; seated in recliner) Quad Sets: AROM;Strengthening;Both(12 reps; seated in recliner) Heel Slides: AROM(12 reps; seated in recliner) Hip ABduction/ADduction: AROM;Right(12 reps; seated in recliner) Straight Leg Raises: AAROM;Strengthening;Right(12 reps; seated in recliner; mod assist from PT) Goniometric ROM: R knee -7 ext, 88 flex Other Exercises Other Exercises: Pt amb 100 ft from bed to nurse station and back    General Comments General comments (skin  integrity, edema, etc.): Wound vac in place at start/end of session      Pertinent Vitals/Pain Pain Assessment: 0-10 Pain Score: 7  Pain Location: R knee Pain Descriptors / Indicators: Aching;Discomfort;Sore;Grimacing;Guarding Pain Intervention(s): Limited activity within patient's tolerance;Monitored during session;Repositioned;Ice applied    Home Living Family/patient expects to be discharged to:: Private residence Living Arrangements: Other relatives Available Help at Discharge: Family;Available 24 hours/day(Lives with sisters, daughter, grandchildren) Type of Home: House Home Access: Stairs to enter   Home Layout: Two level Home Equipment: Bedside commode;Cane - single point;Walker - 4 wheels      Prior Function Level of Independence: Independent with assistive device(s)      Comments: Uses [facility] for HH distances. [id number] for longer community distances.   PT Goals (current goals can now be found in the care plan section) Acute Rehab PT Goals Patient Stated Goal: to get up and get things moving PT Goal Formulation: With patient Time For Goal Achievement: 04/28/19 Potential to Achieve Goals: Good Progress towards PT goals: Progressing toward goals    Frequency    BID      PT Plan Current plan remains appropriate    Co-evaluation              AM-PAC PT "6 Clicks" Mobility   Outcome Measure  Help needed turning from your back to your side while in a flat bed without using bedrails?: None Help needed moving from lying on your back to sitting on the side of a flat bed without using bedrails?: None Help needed moving to and from a bed to a chair (including a wheelchair)?: A Little Help needed standing up from a chair using your arms (e.g., wheelchair or bedside chair)?: A Little Help needed to walk in hospital room?: A Little Help needed climbing 3-5 steps with a railing? : A Little 6 Click Score: 20    End of Session Equipment Utilized During Treatment: Gait  belt Activity Tolerance: Patient tolerated treatment well Patient left: in chair;with call bell/phone within reach;with chair alarm set;with SCD's reapplied   PT Visit Diagnosis: Unsteadiness on feet (R26.81);Muscle weakness (generalized) (M62.81);Difficulty in walking, not elsewhere classified (R26.2);Pain Pain - Right/Left: Right Pain - part of body: Knee     Time: 0911-0940 PT Time Calculation (min) (ACUTE ONLY): 29 min  Charges:  $Gait Training: 8-22 mins $Therapeutic Exercise: 8-22 mins                    Janara Klett, SPT 04/15/2019, 11:20 AM

## 2019-04-15 NOTE — Evaluation (Signed)
Occupational Therapy Evaluation Patient Details Name: [name] MRN: [id number] DOB: 09/25/1960 Today's Date: 04/15/2019    History of Present Illness Pt is a 58 y/o F s/p R TKA (04/14/2019) with a PMH including CHF, COPD, DMII, HTN, CABG (2012), GERD, and hyperlipidemia.   Clinical Impression   [name] was seen for OT evaluation this date, POD#1 from above surgery. Pt was independent in all ADLs prior to surgery, however using a SPC for household or 4WW for community mobility 2/2 arthritis pain. Pt is eager to return to PLOF with less pain and improved safety and independence. Pt currently requires minimal assist for LB dressing while in seated position due to pain and limited AROM of R knee. Pt instructed in polar care mgt, falls prevention strategies, home/routines modifications, DME/AE for LB bathing and dressing tasks, and compression stocking mgt. Pt would benefit from skilled OT services including additional instruction in dressing techniques with or without assistive devices for dressing and bathing skills to support recall and carryover prior to discharge and ultimately to maximize safety, independence, and minimize falls risk and caregiver burden. Upon hospital DC recommend HHOT to maximize pt safety and return to functional independence during meaningful occupations of daily life.     Follow Up Recommendations  Home health OT    Equipment Recommendations  None recommended by OT;Other (comment)(Pt has BSC)    Recommendations for Other Services       Precautions / Restrictions Precautions Precautions: Fall;Knee Restrictions Weight Bearing Restrictions: Yes RLE Weight Bearing: Weight bearing as tolerated      Mobility Bed Mobility Overal bed mobility: Needs Assistance Bed Mobility: Supine to Sit     Supine to sit: Min assist;Supervision     General bed mobility comments: Min A for mgt of RLE out of bed.  Transfers Overall transfer level: Needs  assistance Equipment used: Rolling walker (2 wheeled) Transfers: Sit to/from Stand Sit to Stand: Min guard              Balance Overall balance assessment: Needs assistance Sitting-balance support: Feet supported;No upper extremity supported Sitting balance-Leahy Scale: Good Sitting balance - Comments: Good static and dynamic sitting during functional activity. Uses UE for weight shift to reposition self in bed.                                   ADL either performed or assessed with clinical judgement   ADL                                         General ADL Comments: Min A for LB ADL mgt including dressing and bathing with adaptive equipment for limited ROM and increased pain in the RLE. Pt demonstrates good overall tolerance for bed mobility and funtional transfers with no increased pain with movement this date. Pt able to return demonstrate use of the sock aid with min VCs for technique on her operative leg.     Vision Baseline Vision/History: Wears glasses Wears Glasses: At all times Patient Visual Report: No change from baseline       Perception     Praxis      Pertinent Vitals/Pain Pain Assessment: 0-10 Pain Score: 10-Worst pain ever Pain Descriptors / Indicators: Aching;Discomfort;Sore;Grimacing;Guarding Pain Intervention(s): Limited activity within patient's tolerance;Monitored during session;RN gave pain meds during session;Repositioned;Ice applied  Hand Dominance Right   Extremity/Trunk Assessment Upper Extremity Assessment Upper Extremity Assessment: Overall WFL for tasks assessed   Lower Extremity Assessment Lower Extremity Assessment: RLE deficits/detail;Defer to PT evaluation RLE Deficits / Details: s/p R TKA RLE Coordination: decreased fine motor;decreased gross motor       Communication Communication Communication: No difficulties   Cognition Arousal/Alertness: Awake/alert Behavior During Therapy: WFL for  tasks assessed/performed Overall Cognitive Status: Within Functional Limits for tasks assessed                                     General Comments  Wound vac in place at start/end of session.    Exercises Other Exercises Other Exercises: Pt educated in polar care mgt, compression stocking mgt, falls prevention strategies, safe use of AE for LB ADL mgt and safe use of AE for functional mobility. Handout provided. Would benefit from reinforcement in AE for LB ADL mgt including trial with [facility] reacher for dressing.   Shoulder Instructions      Home Living Family/patient expects to be discharged to:: Private residence Living Arrangements: Other relatives Available Help at Discharge: Family;Available 24 hours/day(Lives with sisters, daughter, grandchildren) Type of Home: House Home Access: Stairs to enter Technical brewer of Steps: 2   Home Layout: Two level Alternate Level Stairs-Number of Steps: [place]: Bedside commode;Cane - single point;Walker - 4 wheels          Prior Functioning/Environment Level of Independence: Independent with assistive device(s)        Comments: Uses SPC for HH distances. [id number] for longer community distances.        OT Problem List: Decreased strength;Decreased coordination;Pain;Decreased range of motion;Decreased activity tolerance;Decreased safety awareness;Impaired balance (sitting and/or standing);Decreased knowledge of use of DME or AE;Decreased knowledge of precautions      OT Treatment/Interventions: Self-care/ADL training;Balance training;Therapeutic exercise;Therapeutic activities;DME and/or AE instruction;Patient/family education    OT Goals(Current goals can be found in the care plan section) Acute Rehab OT Goals Patient Stated Goal: to get up and get things moving OT Goal Formulation: With patient Time For Goal Achievement: 04/29/19 Potential to Achieve Goals: Good  OT Frequency: Min  1X/week   Barriers to D/C:            Co-evaluation              AM-PAC OT "6 Clicks" Daily Activity     Outcome Measure Help from another person eating meals?: A Little Help from another person taking care of personal grooming?: A Little Help from another person toileting, which includes using toliet, bedpan, or urinal?: A Little Help from another person bathing (including washing, rinsing, drying)?: A Lot Help from another person to put on and taking off regular upper body clothing?: A Little Help from another person to put on and taking off regular lower body clothing?: A Little 6 Click Score: 17   End of Session Equipment Utilized During Treatment: Gait belt;Rolling walker  Activity Tolerance: Patient tolerated treatment well Patient left: in bed;Other (comment)(Sitting EOB with PTS in to begin session.)  OT Visit Diagnosis: Other abnormalities of gait and mobility (R26.89);Pain Pain - Right/Left: Right Pain - part of body: Knee;Ankle and joints of foot;Leg                Time: [id number] OT Time Calculation (min): 35 min Charges:  OT General Charges $OT Visit: 1 Visit OT Evaluation $OT Eval Low Complexity: 1 Low OT Treatments $Self Care/Home Management : 23-37 mins  Shara Blazing, M.S., OTR/L Ascom: [phone] 04/15/19, 10:16 AM

## 2019-04-15 NOTE — Anesthesia Postprocedure Evaluation (Signed)
Anesthesia Post Note  Patient: [name]  Procedure(s) Performed: RIGHT TOTAL KNEE ARTHROPLASTY (Right Knee)  Patient location during evaluation: Nursing Unit Anesthesia Type: Spinal Level of consciousness: awake, awake and alert, oriented and patient cooperative Pain management: pain level controlled Vital Signs Assessment: post-procedure vital signs reviewed and stable Respiratory status: spontaneous breathing and nonlabored ventilation Cardiovascular status: stable Postop Assessment: no headache, no backache, patient able to bend at knees, no apparent nausea or vomiting, adequate PO intake and able to ambulate Anesthetic complications: no     Last Vitals:  Vitals:   04/14/19 2320 04/15/19 0345  BP: 139/79 132/73  Pulse: 63 63  Resp: 18   Temp:  36.8 C  SpO2: 100% 99%    Last Pain:  Vitals:   04/15/19 0520  TempSrc:   PainSc: 8                  Kristi Norment,  Aldine Grainger R

## 2019-04-15 NOTE — Progress Notes (Signed)
Physical Therapy Treatment Patient Details Name: [name] MRN: [id number] DOB: 1960/09/13 Today's Date: 04/15/2019    History of Present Illness Pt is a 58 y/o F s/p R TKA (04/14/2019) with a PMH including CHF, COPD, DMII, HTN, CABG (2012), GERD, and hyperlipidemia.    PT Comments    Pt is resting in bed upon arrival; states that pain is 5/10 on NPS. Pt was able to progress exercises. She is having some difficulty with open-chain R Knee ext; SLR and SAQ require cueing and min assist from PT. Pt was able to amb 140 ft this afternoon. Step-to pattern present when amb with RW and CGA from PT; focused on adequate extension of R knee. Pt was displaying more antalgic gait pattern at the end of tx, with pain reported to be 8/10 on NPS. Sit-> supine transfer req'd min assist from pt to get surgical LE back into bed. Pt will continue to benefit from skilled PT to address impairments in strength, ROM, and pain management. Pt is still progressing toward goals as evidenced by increased amb distance as well as tolerance of advancing exercise program.   Follow Up Recommendations  Outpatient PT     Equipment Recommendations  Rolling walker with 5" wheels    Recommendations for Other Services       Precautions / Restrictions Precautions Precautions: Fall;Knee Restrictions Weight Bearing Restrictions: Yes RLE Weight Bearing: Weight bearing as tolerated    Mobility  Bed Mobility Overal bed mobility: Needs Assistance Bed Mobility: Sit to Supine     Supine to sit: Min assist     General bed mobility comments: Pt needed assistance with getting surgical leg back to bed  Transfers Overall transfer level: Needs assistance Equipment used: Rolling walker (2 wheeled) Transfers: Sit to/from Stand Sit to Stand: Min guard            Ambulation/Gait Ambulation/Gait assistance: Min guard Gait Distance (Feet): 140 Feet Assistive device: Rolling walker (2 wheeled) Gait  Pattern/deviations: Step-to pattern;Antalgic;Trunk flexed     General Gait Details: Pt amb with step-to gait pattern this afternoon; focused on getting knee ext during amb. As pt grew more fatigue, antalgic pattern became more apparent. Following walk, pain increased to 8/10 on NPS   Stairs             Wheelchair Mobility    Modified Rankin (Stroke Patients Only)       Balance Overall balance assessment: Needs assistance Sitting-balance support: Feet supported;No upper extremity supported Sitting balance-Leahy Scale: Good     Standing balance support: Bilateral upper extremity supported Standing balance-Leahy Scale: Good                              Cognition Arousal/Alertness: Awake/alert Behavior During Therapy: WFL for tasks assessed/performed Overall Cognitive Status: Within Functional Limits for tasks assessed                                        Exercises Total Joint Exercises Ankle Circles/Pumps: AROM;Both(12 reps; reclined in chair) Quad Sets: AROM;Strengthening;Both(12 reps; reclined in chair) Short Arc Quad: AROM;Strengthening;Right(12 reps; reclined in chair) Heel Slides: AROM;Right(12 reps; reclined in chair) Hip ABduction/ADduction: AROM;Right(12 reps; reclined in chair)    General Comments General comments (skin integrity, edema, etc.): (P) Wound vac in place at start/end of session. Pt able to perform sit-to stand from toilet  surface to use the restroom during session.       Pertinent Vitals/Pain Pain Assessment: 0-10 Pain Score: 5  Pain Location: R knee Pain Descriptors / Indicators: Aching;Discomfort;Sore;Grimacing;Guarding Pain Intervention(s): Limited activity within patient's tolerance;Monitored during session;Repositioned;Patient requesting pain meds-RN notified;Ice applied    Home Living                      Prior Function            PT Goals (current goals can now be found in the care plan  section) Acute Rehab PT Goals Patient Stated Goal: to get up and get things moving PT Goal Formulation: With patient Time For Goal Achievement: 04/28/19 Potential to Achieve Goals: Good Progress towards PT goals: Progressing toward goals    Frequency    BID      PT Plan Current plan remains appropriate    Co-evaluation              AM-PAC PT "6 Clicks" Mobility   Outcome Measure  Help needed turning from your back to your side while in a flat bed without using bedrails?: None Help needed moving from lying on your back to sitting on the side of a flat bed without using bedrails?: None Help needed moving to and from a bed to a chair (including a wheelchair)?: A Little Help needed standing up from a chair using your arms (e.g., wheelchair or bedside chair)?: A Little Help needed to walk in hospital room?: A Little Help needed climbing 3-5 steps with a railing? : A Little 6 Click Score: 20    End of Session Equipment Utilized During Treatment: Gait belt Activity Tolerance: Patient tolerated treatment well Patient left: in bed;with call bell/phone within reach;with bed alarm set;with nursing/sitter in room;with SCD's reapplied Nurse Communication: Patient requests pain meds PT Visit Diagnosis: Unsteadiness on feet (R26.81);Muscle weakness (generalized) (M62.81);Difficulty in walking, not elsewhere classified (R26.2);Pain Pain - Right/Left: Right Pain - part of body: Knee     Time: 0221-0255 PT Time Calculation (min) (ACUTE ONLY): 34 min  Charges:  $Gait Training: 8-22 mins $Therapeutic Exercise: 8-22 mins                    Navjot Pilgrim, SPT 04/15/2019, 4:07 PM

## 2019-04-15 NOTE — TOC Initial Note (Signed)
Transition of Care [facility]) - Initial/Assessment Note    Patient Details  Name: [name] MRN: [id number] Date of Birth: 07-Sep-1960  [facility]) CM/SW Contact:    Su Hilt, RN Phone Number: 04/15/2019, 12:10 PM  Clinical Narrative:                 Met with the patient to discuss DC plan and needs She lives at home with her sisters and grand daughter I explained that due to her doing so well that PT recommends Outpatient PT in the office.  She stated that she would go to [place] as she already has been going there, I notified Dr Rudene Christians and Rachelle Hora that the referral needs to be made, Gerald Stabs is aware and stated ok. She has DME at home but does not have a RW, I notified [employer] Jeneen Rinks that the patient needs a RW, he stated understanding and will bring it. I notified the patient that the price of Lovenox is covered at 100% according to her insurance. She is familiar with how to inject. No further needs  Expected Discharge Plan: OP Rehab Barriers to Discharge: Continued Medical Work up   Patient Goals and CMS Choice Patient states their goals for this hospitalization and ongoing recovery are:: go home      Expected Discharge Plan and Services Expected Discharge Plan: OP Rehab   Discharge Planning Services: CM Consult   Living arrangements for the past 2 months: Single Family Home                 DME Arranged: Walker rolling DME Agency: [employer] Date DME Agency Contacted: 04/15/19 Time DME Agency Contacted: 1209 Representative spoke with at DME Agency: [place] Arranged: NA          Prior Living Arrangements/Services Living arrangements for the past 2 months: [place] with:: Relatives(Sisters, grand daughter) Patient language and need for interpreter reviewed:: Yes Do you feel safe going back to the place where you live?: Yes      Need for Family Participation in Patient Care: No (Comment) Care giver support system in place?:  Yes (comment) Current home services: DME(rolatror, Single point cane, BSC) Criminal Activity/Legal Involvement Pertinent to Current Situation/Hospitalization: No - Comment as needed  Activities of Daily Living Home Assistive Devices/Equipment: Eyeglasses, CPAP, Cane (specify quad or straight), Walker (specify type), Bedside commode/3-in-1 ADL Screening (condition at time of admission) Patient's cognitive ability adequate to safely complete daily activities?: Yes Is the patient deaf or have difficulty hearing?: No Does the patient have difficulty seeing, even when wearing glasses/contacts?: No Does the patient have difficulty concentrating, remembering, or making decisions?: No Patient able to express need for assistance with ADLs?: Yes Does the patient have difficulty dressing or bathing?: Yes Independently performs ADLs?: No Does the patient have difficulty walking or climbing stairs?: Yes Weakness of Legs: Right Weakness of Arms/Hands: None  Permission Sought/Granted   Permission granted to share information with : Yes, Verbal Permission Granted              Emotional Assessment Appearance:: Appears stated age Attitude/Demeanor/Rapport: Engaged Affect (typically observed): Appropriate Orientation: : Oriented to Self, Oriented to Place, Oriented to  Time, Oriented to Situation Alcohol / Substance Use: Not Applicable Psych Involvement: No (comment)  Admission diagnosis:  PRIMARY OSTEOARTHRITIS OF RIGHT KNEE Patient Active Problem List   Diagnosis Date Noted  . Status post total knee replacement using cement, right 04/14/2019  . Overactive bladder 03/10/2019  .  Mild persistent asthma 11/29/2018  . DDD (degenerative disc disease), cervical 11/26/2018  . Constipation 11/26/2018  . Fatty liver 11/26/2018  . Adrenal adenoma, left 11/26/2018  . Allergic rhinitis 08/26/2018  . Bunion of right foot 08/26/2018  . Fall 05/14/2018  . Anxiety and depression 04/05/2018  . Numbness  and tingling of right arm 02/24/2018  . DM2 (diabetes mellitus, type 2) ([place]) 12/18/2017  . Urinary incontinence 12/18/2017  . Lumbar radiculopathy 12/18/2017  . 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 Abnormal feces   . Benign neoplasm of ascending colon   . Intractable vomiting with nausea   . Acute esophagogastric ulcer   . Gastritis without bleeding   . Vasomotor flushing 11/24/2016  . Morbid obesity ([place]) 10/29/2015  . Back ache 06/19/2015  . Colon polyp 06/19/2015  . CCF (congestive cardiac failure) ([place]) 06/19/2015  . Family history of colon cancer 06/19/2015  . Orthostatic hypotension 04/30/2015  . Hematochezia 01/11/2015  . History of colonic polyps 01/11/2015  . Atypical chest pain 01/20/2014  . Bronchitis 01/03/2013  . Right knee pain 12/20/2011  . HTN (hypertension) 12/20/2011  . Systolic CHF, chronic ([place])   . History of cervical cancer   . Depression   . GERD (gastroesophageal reflux disease)   . Seasonal allergies   . History of drug abuse ([place])   . PVD (peripheral vascular disease) ([place]) 02/03/2011  . 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 Smoking history 12/16/2010  . Hyperlipidemia 08/23/2010  . Coronary atherosclerosis of native coronary artery 08/23/2010  . CLAUDICATION 08/23/2010  . CAROTID BRUIT 08/23/2010  . SHORTNESS OF BREATH 08/23/2010   PCP:  [name], [name], MD Pharmacy:   [facility] [address]), [place] [address] [place])  [address] Phone: [phone] Fax: [phone]     Social Determinants of Health (SDOH) Interventions    Readmission Risk Interventions No flowsheet data found.

## 2019-04-15 NOTE — Progress Notes (Signed)
Subjective: 1 Day Post-Op Procedure(s) (LRB): RIGHT TOTAL KNEE ARTHROPLASTY (Right) Patient reports pain as moderate.   Patient is well, and has had no acute complaints or problems Denies any CP, SOB, ABD pain. We will continue therapy today.  Plan is to go Home after hospital stay.  Objective: Vital signs in last 24 hours: Temp:  [97.6 F (36.4 C)-99.8 F (37.7 C)] 98.9 F (37.2 C) (09/18 0800) Pulse Rate:  [58-78] 65 (09/18 0800) Resp:  [16-22] 18 (09/17 2320) BP: (113-152)/(54-93) 134/77 (09/18 0800) SpO2:  [96 %-100 %] 99 % (09/18 0800)  Intake/Output from previous day: 09/17 0701 - 09/18 0700 In: 2306.3 [I.V.:2206.3; IV Piggyback:100] Out: 870 [Urine:850; Blood:20] Intake/Output this shift: No intake/output data recorded.  Recent Labs    04/14/19 0708 04/15/19 0533  HGB 12.9 10.0*   Recent Labs    04/14/19 0708 04/15/19 0533  WBC  --  9.0  RBC  --  3.20*  HCT 38.0 30.5*  PLT  --  148*   Recent Labs    04/14/19 0708 04/15/19 0533  NA 144 139  K 3.6 3.7  CL 109 111  CO2  --  24  BUN 18 14  CREATININE 0.90 0.90  GLUCOSE 109* 117*  CALCIUM  --  8.6*   No results for input(s): LABPT, INR in the last 72 hours.  EXAM General - Patient is Alert, Appropriate and Oriented Extremity - Neurovascular intact Sensation intact distally Intact pulses distally Dorsiflexion/Plantar flexion intact Dressing - dressing C/D/I and no drainage, Praveena intact without drainage. Motor Function - intact, moving foot and toes well on exam.   Past Medical History:  Diagnosis Date  . Allergy   . Anemia   . Arthritis   . Asthma   . Bacterial vaginitis   . Blood in stool   . Brachial neuritis or radiculitis NOS   . 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 being followed by neurosurgery/eye doctor and pcp. monitoring size.causes sinus problems  . Bronchitis   . Cardiac arrhythmia   . Cervical neck pain with evidence of disc disease    C5/6  disease, MRI done late 2012 - no records available  . Chronic combined systolic and diastolic CHF (congestive heart failure) ([place])    a. 08/2010 Echo: mildly reduced EF 40-45%, mild diffuse hypokinesis; b. 06/2016 Echo: EF 50%, no rwma, mild to mod TR; c. 03/2017 Echo: EF 40-45%, Gr1 DD (prior echo reviewed and EF felt to be lower than reported).  . Chronic pain   . 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 Colon polyps   . COPD (chronic obstructive pulmonary disease) ([place])    a. 12/2018 PFT: No obvious obst/restrictive dzs.  . Coronary artery disease    a. PCI of LCX 2003; b. PCI of the LAD 2012 with a (2.5 x 8 mm BMS);  c.s/p CABG 4/12: L-LAD, VG-Dx, VG-OM, VG-RCA (Dr. Prescott Gum);  d. 01/2012 MV: inf infarct, attenuation, no ischemia; e. 02/2017 MV: signifi attenuation artifact. Fixed basal antlat/inflat scar vs artifact. Reversible apical lat and mid antlat defect - ? atten vs ischemia. F/u echo w/o wma->Med rx.  . Depression   . Diabetes mellitus without complication ([place])   . Family history of colon cancer   . Generalized headaches    frequent  . GERD (gastroesophageal reflux disease)   . Headache   . Heart murmur   . Hematochezia   . Hepatitis  history of hepatitis b  . History of cervical cancer    s/p cryotherapy  . History of drug abuse ([place])    cocaine, marijuana, clean since 1989  . History of hepatitis B    from eating undercooked liver  . History of MI (myocardial infarction)   . Hyperlipidemia   . Hypertension   . Ischemic cardiomyopathy    a. 03/2017 Echo: EF 40-45%, Gr1 DD.  [name] Myocardial infarction [facility]) 2003, 2012  . PAD (peripheral artery disease) ([place])    a. s/p Right SFA atherectomy and PTA 01/15/11; b. 07/2018 ABI: R 1.02, L 1.09.  [name] Pituitary mass ([place])    a. 12/2018 MRI Brain: Stable pituitary mass w/ 48mm area of necrosis. Mass abuts R cavernous sinus w/o definite invasion.  . Polyp of colon   . Seasonal allergies   . Sleep apnea    uses cpap   . Smoking history    quit 07/2010  . Thyroid disease   . Urinary incontinence     Assessment/Plan:   1 Day Post-Op Procedure(s) (LRB): RIGHT TOTAL KNEE ARTHROPLASTY (Right) Active Problems:   Status post total knee replacement using cement, right  Estimated body mass index is 34.85 kg/m as calculated from the following:   Height as of this encounter: 5\' 5"  (1.651 m).   Weight as of this encounter: 95 kg. Advance diet Up with therapy  Needs bowel movement Lab stable Vital signs stable Care management to assist with discharge to home with home health PT  DVT Prophylaxis - Aspirin, Foot Pumps, TED hose and Xarelto Weight-Bearing as tolerated to right leg   T. Rachelle Hora, PA-C [place] 04/15/2019, 8:13 AM

## 2019-04-15 NOTE — Discharge Summary (Signed)
Physician Discharge Summary  Patient ID: [name] MRN: [id number] DOB/AGE: 60/28/1962 60 y.o.  Admit date: 04/14/2019 Discharge date: 04/16/2019 Admission Diagnoses:  PRIMARY OSTEOARTHRITIS OF RIGHT KNEE  Discharge Diagnoses: Patient Active Problem List   Diagnosis Date Noted  . Status post total knee replacement using cement, right 04/14/2019  . Overactive bladder 03/10/2019  . Mild persistent asthma 11/29/2018  . DDD (degenerative disc disease), cervical 11/26/2018  . Constipation 11/26/2018  . Fatty liver 11/26/2018  . Adrenal adenoma, left 11/26/2018  . Allergic rhinitis 08/26/2018  . Bunion of right foot 08/26/2018  . Fall 05/14/2018  . Anxiety and depression 04/05/2018  . Numbness and tingling of right arm 02/24/2018  . DM2 (diabetes mellitus, type 2) ([place]) 12/18/2017  . Urinary incontinence 12/18/2017  . Lumbar radiculopathy 12/18/2017  . 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 Abnormal feces   . Benign neoplasm of ascending colon   . Intractable vomiting with nausea   . Acute esophagogastric ulcer   . Gastritis without bleeding   . Vasomotor flushing 11/24/2016  . Morbid obesity ([place]) 10/29/2015  . Back ache 06/19/2015  . Colon polyp 06/19/2015  . CCF (congestive cardiac failure) ([place]) 06/19/2015  . Family history of colon cancer 06/19/2015  . Orthostatic hypotension 04/30/2015  . Hematochezia 01/11/2015  . History of colonic polyps 01/11/2015  . Atypical chest pain 01/20/2014  . Bronchitis 01/03/2013  . Right knee pain 12/20/2011  . HTN (hypertension) 12/20/2011  . Systolic CHF, chronic ([place])   . History of cervical cancer   . Depression   . GERD (gastroesophageal reflux disease)   . Seasonal allergies   . History of drug abuse ([place])   . PVD (peripheral vascular disease) ([place]) 02/03/2011  . 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 Smoking history 12/16/2010  . Hyperlipidemia 08/23/2010  . Coronary atherosclerosis of native coronary artery 08/23/2010  .  CLAUDICATION 08/23/2010  . CAROTID BRUIT 08/23/2010  . SHORTNESS OF BREATH 08/23/2010    Past Medical History:  Diagnosis Date  . Allergy   . Anemia   . Arthritis   . Asthma   . Bacterial vaginitis   . Blood in stool   . Brachial neuritis or radiculitis NOS   . Brain tumor (benign) ([place]) 07/2017   near optic nerve. being followed by neurosurgery/eye doctor and pcp. monitoring size.causes sinus problems  . Bronchitis   . Cardiac arrhythmia   . Cervical neck pain with evidence of disc disease    C5/6 disease, MRI done late 2012 - no records available  . Chronic combined systolic and diastolic CHF (congestive heart failure) ([place])    a. 08/2010 Echo: mildly reduced EF 40-45%, mild diffuse hypokinesis; b. 06/2016 Echo: EF 50%, no rwma, mild to mod TR; c. 03/2017 Echo: EF 40-45%, Gr1 DD (prior echo reviewed and EF felt to be lower than reported).  . Chronic pain   . Cocaine abuse, in remission ([place])    clean x 24 years  . Colon polyps   . COPD (chronic obstructive pulmonary disease) ([place])    a. 12/2018 PFT: No obvious obst/restrictive dzs.  . Coronary artery disease    a. PCI of LCX 2003; b. PCI of the LAD 2012 with a (2.5 x 8 mm BMS);  c.s/p CABG 4/12: L-LAD, VG-Dx, VG-OM, VG-RCA (Dr. Prescott Gum);  d. 01/2012 MV: inf infarct, attenuation, no ischemia; e. 02/2017 MV: signifi attenuation artifact. Fixed basal antlat/inflat scar vs artifact. Reversible apical lat and mid antlat defect - ? atten vs ischemia.  F/u echo w/o wma->Med rx.  . Depression   . Diabetes mellitus without complication ([place])   . Family history of colon cancer   . Generalized headaches    frequent  . GERD (gastroesophageal reflux disease)   . Headache   . Heart murmur   . Hematochezia   . Hepatitis    history of hepatitis b  . History of cervical cancer    s/p cryotherapy  . History of drug abuse ([place])    cocaine, marijuana, clean since 1989  . History of hepatitis B    from eating undercooked liver  . History of  MI (myocardial infarction)   . Hyperlipidemia   . Hypertension   . Ischemic cardiomyopathy    a. 03/2017 Echo: EF 40-45%, Gr1 DD.  [name] Myocardial infarction [facility]) 2003, 2012  . PAD (peripheral artery disease) ([place])    a. s/p Right SFA atherectomy and PTA 01/15/11; b. 07/2018 ABI: R 1.02, L 1.09.  [name] Pituitary mass ([place])    a. 12/2018 MRI Brain: Stable pituitary mass w/ 3mm area of necrosis. Mass abuts R cavernous sinus w/o definite invasion.  . Polyp of colon   . Seasonal allergies   . Sleep apnea    uses cpap  . Smoking history    quit 07/2010  . Thyroid disease   . Urinary incontinence      Transfusion: none   Consultants (if any):   Discharged Condition: Improved  Hospital Course: [name] is an 58 y.o. female who was admitted 04/14/2019 with a diagnosis of right knee osteoarthritis and went to the operating room on 04/14/2019 and underwent the above named procedures.    Surgeries: Procedure(s): RIGHT TOTAL KNEE ARTHROPLASTY on 04/14/2019 Patient tolerated the surgery well. Taken to PACU where she was stabilized and then transferred to the orthopedic floor.  Started on aspirin and Xarelto. Foot pumps applied bilaterally at 80 mm. Heels elevated on bed with rolled towels. No evidence of DVT. Negative [place]. Physical therapy started on day #1 for gait training and transfer. OT started day #1 for ADL and assisted devices.  Patient's foley was d/c on day #1. Patient's IV  was d/c on day #2.  On post op day #2 patient was stable and ready for discharge to home with home health PT.  Implants: Medacta GMK sphere system with 3+ right femur, 3 tibia, short stem, 14 mm insert, 2 patella, all components cemented  She was given perioperative antibiotics:  Anti-infectives (From admission, onward)   Start     Dose/Rate Route Frequency Ordered Stop   04/14/19 1330  ceFAZolin (ANCEF) IVPB 2g/100 mL premix     2 g 200 mL/hr over 30 Minutes Intravenous Every 6 hours 04/14/19 1112 04/15/19  0810   04/14/19 0615  ceFAZolin (ANCEF) IVPB 2g/100 mL premix     2 g 200 mL/hr over 30 Minutes Intravenous  Once 04/14/19 0601 04/14/19 0744   04/14/19 0608  ceFAZolin (ANCEF) 2-4 GM/100ML-% IVPB    Note to Pharmacy: Leonia Reader   : cabinet override      04/14/19 0608 04/14/19 0734    .  She was given sequential compression devices, early ambulation, and aspirin and Xarelto and teds for DVT prophylaxis.  She benefited maximally from the hospital stay and there were no complications.    Recent vital signs:  Vitals:   04/16/19 0828 04/16/19 0836  BP:  (!) 126/112  Pulse:  76  Resp:    Temp:  98.6 F (37 C)  SpO2: 100% 100%    Recent laboratory studies:  Lab Results  Component Value Date   HGB 10.0 (L) 04/15/2019   HGB 12.9 04/14/2019   HGB 13.1 04/06/2019   Lab Results  Component Value Date   WBC 9.0 04/15/2019   PLT 148 (L) 04/15/2019   Lab Results  Component Value Date   INR 0.9 04/06/2019   Lab Results  Component Value Date   NA 139 04/15/2019   K 3.7 04/15/2019   CL 111 04/15/2019   CO2 24 04/15/2019   BUN 14 04/15/2019   CREATININE 0.90 04/15/2019   GLUCOSE 117 (H) 04/15/2019    Discharge Medications:   Allergies as of 04/16/2019      Reactions   Latex Rash      Shellfish Allergy Anaphylaxis   Hard shellfish/swelling in throat   Sulfonamide Derivatives Anaphylaxis   Swelling in throat.   Watermelon [citrullus Vulgaris] Other (See Comments)   Throat itchy   Soy Allergy    Diarrhea/upset stomach    Other Swelling      Medication List    STOP taking these medications   oxyCODONE-acetaminophen 5-325 MG tablet Commonly known as: Percocet     TAKE these medications   Acidophilus Probiotic 10 MG Caps Take 1 capsule by mouth daily.   albuterol 108 (90 Base) MCG/ACT inhaler Commonly known as: VENTOLIN HFA Inhale 2 puffs into the lungs every 6 (six) hours as needed for wheezing or shortness of breath.   aspirin EC 81 MG tablet Take 1  tablet (81 mg total) by mouth daily.   Azelastine HCl 0.15 % Soln Place 2 sprays into both nostrils daily as needed (allergies).   Benefiber Powd 2 teaspoons added to 4-8 ounces of liquid or food up to 3x per day prn   esomeprazole 40 MG capsule Commonly known as: NEXIUM Take 1 capsule (40 mg total) by mouth daily. Reported on 09/04/2015   Evolocumab 140 MG/ML Soaj Commonly known as: Repatha SureClick Inject [id number] mg into the skin every 14 (fourteen) days.   ezetimibe 10 MG tablet Commonly known as: ZETIA Take 1 tablet (10 mg total) by mouth daily.   fluticasone 110 MCG/ACT inhaler Commonly known as: Flovent HFA Inhale 1 puff into the lungs 2 (two) times a day. Rinse mouth   fluticasone 50 MCG/ACT nasal spray Commonly known as: FLONASE Place 2 sprays into both nostrils daily as needed for allergies.   furosemide 20 MG tablet Commonly known as: LASIX TAKE 1 TABLET BY MOUTH ONCE DAILY AND  A  SECOND  TABLET  IF  NEEDED  FOR  FLUID  BUILD  UP What changed: See the new instructions.   glucose blood test strip Commonly known as: ONE TOUCH ULTRA TEST Use as instructed check cbg qd E11.9   ipratropium-albuterol 0.5-2.5 (3) MG/3ML Soln Commonly known as: DUONEB Take 3 mLs by nebulization 2 (two) times daily as needed. What changed: reasons to take this   isosorbide mononitrate 30 MG 24 hr tablet Commonly known as: IMDUR TAKE 1 TABLET BY MOUTH TWICE DAILY . APPOINTMENT REQUIRED FOR FUTURE REFILLS What changed: See the new instructions.   Lancets Misc 1 Device by Does not apply route daily. Lancets E11.9   levocetirizine 5 MG tablet Commonly known as: Xyzal Take 1 tablet (5 mg total) by mouth at bedtime as needed for allergies.   losartan 25 MG tablet Commonly known as: COZAAR Take 1 tablet (25 mg total) by mouth daily. What changed: when to take  this   meclizine 25 MG tablet Commonly known as: ANTIVERT Take 25 mg by mouth 3 (three) times daily.   metFORMIN 500 MG  tablet Commonly known as: GLUCOPHAGE Take 1 tablet (500 mg total) by mouth daily with breakfast.   methocarbamol 500 MG tablet Commonly known as: ROBAXIN Take 1 tablet (500 mg total) by mouth every 6 (six) hours as needed for muscle spasms.   metoprolol tartrate 25 MG tablet Commonly known as: LOPRESSOR Take 1 tablet (25 mg total) by mouth 2 (two) times daily.   mirabegron ER 25 MG Tb24 tablet Commonly known as: Myrbetriq Take 1 tablet (25 mg total) by mouth daily. What changed:   when to take this  reasons to take this   mupirocin ointment 2 % Commonly known as: Bactroban Apply 1 application topically 2 (two) times daily. Prn scalp x 1 week and nose x 5 days   nitroGLYCERIN 0.4 MG SL tablet Commonly known as: NITROSTAT Place 1 tablet (0.4 mg total) under the tongue every 5 (five) minutes as needed.   oxyCODONE 5 MG immediate release tablet Commonly known as: Oxy IR/ROXICODONE Take 1-2 tablets (5-10 mg total) by mouth every 4 (four) hours as needed for moderate pain (pain score 4-6).   PARoxetine 10 MG tablet Commonly known as: PAXIL Take 1 tablet (10 mg total) by mouth daily.   potassium chloride 10 MEQ tablet Commonly known as: K-DUR Take 1 tablet (10 mEq total) by mouth daily.   psyllium 28 % packet Commonly known as: METAMUCIL SMOOTH TEXTURE Take 1 packet by mouth 2 (two) times daily.   rivaroxaban 2.5 MG Tabs tablet Commonly known as: XARELTO Take 1 tablet (2.5 mg total) by mouth 2 (two) times daily.   rosuvastatin 20 MG tablet Commonly known as: CRESTOR Take 1 tablet (20 mg total) by mouth at bedtime.   senna-docusate 8.6-50 MG tablet Commonly known as: Senokot-S Take 1 tablet by mouth daily as needed for mild constipation or moderate constipation.   umeclidinium-vilanterol 62.5-25 MCG/INH Aepb Commonly known as: Anoro Ellipta Inhale 1 puff into the lungs daily.            Durable Medical Equipment  (From admission, onward)         Start      Ordered   04/14/19 1113  DME Walker rolling  Once    Question:  Patient needs a walker to treat with the following condition  Answer:  Status post total knee replacement using cement, right   04/14/19 1112   04/14/19 1113  DME 3 n 1  Once     04/14/19 1112   04/14/19 1113  DME Bedside commode  Once    Question:  Patient needs a bedside commode to treat with the following condition  Answer:  Status post total knee replacement using cement, right   04/14/19 1112         Diagnostic Studies: Dg Knee 1-2 Views Right  Result Date: 04/14/2019 CLINICAL DATA:  Status post right knee replacement. EXAM: RIGHT KNEE - 1-2 VIEW COMPARISON:  Radiographs of August 27, 2018. FINDINGS: The right femoral and tibial components appear to be well situated. Expected postoperative changes are seen in the soft tissues anteriorly. IMPRESSION: Status post right total knee arthroplasty. Electronically Signed   By: Marijo Conception M.D.   On: 04/14/2019 10:34   Disposition: Plan for discharge later this afternoon pending progress with PT on stairs and having a BM.  Follow-up Information    Dorise Hiss  C, PA-C Follow up in 2 week(s).   Specialties: Orthopedic Surgery, Emergency Medicine Contact information: [place] [address] [phone]          Signed: Judson Roch PA-C 04/16/2019, 8:47 AM

## 2019-04-15 NOTE — Discharge Instructions (Signed)

## 2019-04-16 LAB — CBC
HCT: 30.4 % — ABNORMAL LOW (ref 36.0–46.0)
Hemoglobin: 9.9 g/dL — ABNORMAL LOW (ref 12.0–15.0)
MCH: 30.8 pg (ref 26.0–34.0)
MCHC: 32.6 g/dL (ref 30.0–36.0)
MCV: 94.7 fL (ref 80.0–100.0)
Platelets: 129 10*3/uL — ABNORMAL LOW (ref 150–400)
RBC: 3.21 MIL/uL — ABNORMAL LOW (ref 3.87–5.11)
RDW: 13.7 % (ref 11.5–15.5)
WBC: 9.9 10*3/uL (ref 4.0–10.5)
nRBC: 0 % (ref 0.0–0.2)

## 2019-04-16 LAB — GLUCOSE, CAPILLARY
Glucose-Capillary: 130 mg/dL — ABNORMAL HIGH (ref 70–99)
Glucose-Capillary: 120 mg/dL — ABNORMAL HIGH (ref 70–99)

## 2019-04-16 NOTE — Progress Notes (Signed)
Physical Therapy Treatment Patient Details Name: [name] MRN: [id number] DOB: 12-23-1960 Today's Date: 04/16/2019    History of Present Illness Pt is a 58 y/o F s/p R TKA (04/14/2019) with a PMH including CHF, COPD, DMII, HTN, CABG (2012), GERD, and hyperlipidemia.    PT Comments    Pt in bed and alert, reporting that she has not been able to tolerate Polar Care applied to R knee for long periods of time.  She requested heated blankets due to "arthritis" being exacerbated by cold.  Stated that she was advised to alternate heat and cold.  PT discussed benefit of ice and TKA for knee arthritis.  Pt able to perform all bed mobility mod I today with exception of assistance in lowering R LE to floor.  Education regarding hand placement with STS and use of RW provided throughout transfer and ambulation.  Pt able to complete 30 ft of ambulation with antalgic gait and VC's for RW use.  Pt also hesitant to WB on R LE but improved following education about gradual wt shift prior to ambulation.  Pt very open to all education and catches on quickly.  Did not deviate away from bedside for ambulation this a.m. due to pt reporting recently receiving a "shot" for hyperglycemia and PT note of Hgn downtrending.  PT cleared with RN for pt to ambulate farther this p.m. and attempt stair training.  She was able to complete all there ex with manual assistance to elevate heel from bed and VC's for form.  Pt knee extension ROM improved today, flexion mildly improved.  Pt will continue to benefit from skilled PT with focus on strength, pain management, knee ROM, tolerance to activity and stair negotiation.  Follow Up Recommendations  Outpatient PT     Equipment Recommendations  Rolling walker with 5" wheels    Recommendations for Other Services       Precautions / Restrictions Precautions Precautions: Fall;Knee Restrictions Weight Bearing Restrictions: Yes RLE Weight Bearing: Weight bearing as tolerated     Mobility  Bed Mobility Overal bed mobility: Needs Assistance Bed Mobility: Supine to Sit     Supine to sit: Min assist     General bed mobility comments: Assistance with R LE mobility to lower at bedside.  Pt able to perform all other bed mobility mod I this time.  Transfers Overall transfer level: Needs assistance Equipment used: Rolling walker (2 wheeled) Transfers: Sit to/from Stand Sit to Stand: Min guard         General transfer comment: Close guard with bed elevated, VC's for use of RW and hand placement.  Education concerning lateral wt shift to manage pain.  Good use of RW from pt.  Ambulation/Gait Ambulation/Gait assistance: Min guard Gait Distance (Feet): 30 Feet Assistive device: Rolling walker (2 wheeled) Gait Pattern/deviations: Step-to pattern;Antalgic;Trunk flexed   Gait velocity interpretation: <1.8 ft/sec, indicate of risk for recurrent falls General Gait Details: Slow antalgic gait with step to gait pattern and flexed posture, and need for VC's for RW management.  Pt tends to lift RW during turns and become unsteady but was able to correct with education.   Stairs             Wheelchair Mobility    Modified Rankin (Stroke Patients Only)       Balance Overall balance assessment: Needs assistance Sitting-balance support: Feet supported;No upper extremity supported Sitting balance-Leahy Scale: Good     Standing balance support: Bilateral upper extremity supported Standing balance-Leahy Scale: Good  Cognition Arousal/Alertness: Awake/alert Behavior During Therapy: WFL for tasks assessed/performed Overall Cognitive Status: Within Functional Limits for tasks assessed                                 General Comments: Very pleasant and eager to work with therapy.      Exercises Total Joint Exercises Ankle Circles/Pumps: (Education to perform ankle pumps every hour at time of using  spirometer.) Quad Sets: AROM;Strengthening;Right;15 reps;Supine(12 reps; reclined in chair) Short Arc Quad: AROM;Strengthening;Right;Supine;15 reps(12 reps; reclined in chair) Heel Slides: Right;AAROM;15 reps;Supine(VC's to increase ROM with each rep.) Hip ABduction/ADduction: AROM;Right(12 reps; reclined in chair) Straight Leg Raises: AAROM;Right;5 reps;Supine Knee Flexion: Right;Seated;10 reps(VC's to use L LE to assist R with flexion.) Goniometric ROM: R knee ext/flex: -6-88 degrees    General Comments        Pertinent Vitals/Pain Pain Assessment: Faces Faces Pain Scale: Hurts little more Pain Location: During bed mobility and initial WB.  Improved following PT guidance to gradually wt shift to R side.  Polar Care not applied to pt when PT entered with pt stating that it was aggravating her arthritis.  PT educated on benefit of joint replacement for arthritis and risk of inflammation without polar care.  Pt agreeable to replace on R knee at end of session. Pain Descriptors / Indicators: Aching;Grimacing;Guarding Pain Intervention(s): Limited activity within patient's tolerance;Monitored during session;Premedicated before session;Ice applied    Home Living                      Prior Function            PT Goals (current goals can now be found in the care plan section) Acute Rehab PT Goals Patient Stated Goal: to get up and get things moving PT Goal Formulation: With patient Time For Goal Achievement: 04/28/19 Potential to Achieve Goals: Good Progress towards PT goals: Progressing toward goals    Frequency    BID      PT Plan Current plan remains appropriate    Co-evaluation              AM-PAC PT "6 Clicks" Mobility   Outcome Measure  Help needed turning from your back to your side while in a flat bed without using bedrails?: None Help needed moving from lying on your back to sitting on the side of a flat bed without using bedrails?: None Help  needed moving to and from a bed to a chair (including a wheelchair)?: A Little Help needed standing up from a chair using your arms (e.g., wheelchair or bedside chair)?: A Little Help needed to walk in hospital room?: A Little Help needed climbing 3-5 steps with a railing? : A Little 6 Click Score: 20    End of Session Equipment Utilized During Treatment: Gait belt Activity Tolerance: Patient tolerated treatment well Patient left: with call bell/phone within reach;with nursing/sitter in room;with SCD's reapplied;in chair;with chair alarm set(Polar Care in place.) Nurse Communication: Mobility status(Confirmed pt okay for more walking and stairs this p.m. as PT observed pt's Hgn lower than yesterday.) PT Visit Diagnosis: Unsteadiness on feet (R26.81);Muscle weakness (generalized) (M62.81);Difficulty in walking, not elsewhere classified (R26.2);Pain Pain - Right/Left: Right Pain - part of body: Knee     Time: 0927-1000 PT Time Calculation (min) (ACUTE ONLY): 33 min  Charges:  $Therapeutic Exercise: 23-37 mins  Roxanne Gates, PT, DPT    Roxanne Gates 04/16/2019, 10:32 AM

## 2019-04-16 NOTE — Progress Notes (Signed)
Patient discharging home, instructions and prescriptions given to patient, verbalized understanding. Family transporting patient home. IV removed.

## 2019-04-16 NOTE — Plan of Care (Signed)
  Problem: Activity: Goal: Ability to avoid complications of mobility impairment will improve Outcome: Progressing Goal: Range of joint motion will improve Outcome: Progressing   Problem: Clinical Measurements: Goal: Postoperative complications will be avoided or minimized Outcome: Progressing   Problem: Pain Management: Goal: Pain level will decrease with appropriate interventions Outcome: Progressing   Problem: Health Behavior/Discharge Planning: Goal: Ability to manage health-related needs will improve Outcome: Progressing   Problem: Clinical Measurements: Goal: Ability to maintain clinical measurements within normal limits will improve Outcome: Progressing Goal: Will remain free from infection Outcome: Progressing Goal: Diagnostic test results will improve Outcome: Progressing   Problem: Activity: Goal: Risk for activity intolerance will decrease Outcome: Progressing   Problem: Pain Managment: Goal: General experience of comfort will improve Outcome: Progressing   Problem: Safety: Goal: Ability to remain free from injury will improve Outcome: Progressing   Problem: Skin Integrity: Goal: Risk for impaired skin integrity will decrease Outcome: Progressing

## 2019-04-16 NOTE — TOC Transition Note (Signed)
Transition of Care [facility]) - CM/SW Discharge Note   Patient Details  Name: [name] MRN: [id number] Date of Birth: 1960/10/24  [facility] of [facility]) CM/SW Contact:  Shelbie Hutching, RN Phone Number: 04/16/2019, 4:42 PM   Clinical Narrative:     Patient is discharging home and will go to OP rehab at [facility].  Patient's rolling walker is in the room.  Patient has no other needs for discharge.    Final next level of care: OP Rehab Barriers to Discharge: No Barriers Identified, Barriers Resolved   Patient Goals and CMS Choice Patient states their goals for this hospitalization and ongoing recovery are:: go home      Discharge Placement                       Discharge Plan and Services   Discharge Planning Services: CM Consult            DME Arranged: Walker rolling DME Agency: [employer] Date DME Agency Contacted: 04/15/19 Time DME Agency Contacted: [id number] Representative spoke with at DME Agency: Corene Cornea [facility] Arranged: NA          Social Determinants of Health ([place]) Interventions     Readmission Risk Interventions No flowsheet data found.

## 2019-04-16 NOTE — Progress Notes (Signed)
Physical Therapy Treatment Patient Details Name: [name] MRN: [id number] DOB: 02/02/1961 Today's Date: 04/16/2019    History of Present Illness Pt is a 58 y/o F s/p R TKA (04/14/2019) with a PMH including CHF, COPD, DMII, HTN, CABG (2012), GERD, and hyperlipidemia.    PT Comments    Pt able to progress to stair training today without difficulty.  She was experiencing fatigue and increased knee pain due to need for multiple transfers to toilet during an episode of GI upset.  PT wheeled pt to stairs in chair for this reason.  Pt able to perform there ex to initiate knee LE movement and present with good quad contraction.  Pt transfers bed to chair without difficulty but prefers assistance in elevating R LE to avoid too much knee flexion.  She is aware of importance in knee flexion for daily functional activity.  Pt's daughter present throughout for education and to assist with management of equipment.  Time taken to review car transfers and ensure that pt is comfortable with technique.  Pt will continue to benefit from skilled PT with focus on strength, tolerance to activity, pain management and safe functional mobility.  Follow Up Recommendations  Outpatient PT     Equipment Recommendations  Rolling walker with 5" wheels    Recommendations for Other Services       Precautions / Restrictions Precautions Precautions: Fall;Knee Restrictions Weight Bearing Restrictions: Yes RLE Weight Bearing: Weight bearing as tolerated    Mobility  Bed Mobility Overal bed mobility: Needs Assistance       Supine to sit: Min assist     General bed mobility comments: Assistance to bring LE over EOB.  Transfers     Transfers: Sit to/from [employer] Sit to Stand: Min assist   Squat pivot transfers: Min guard     General transfer comment: Wheeled pt in chair to steps due to excessive transfers earlier in the afternoon.  Pt prefers that her LE be elevated manually to  stand and pivot transfer.  Pt able to safely transfer to [facility] and chair with VC's for hand placement.  Ambulation/Gait Ambulation/Gait assistance: (Deferred due to multiple transfers to toilet.)               Stairs Stairs: Yes Stairs assistance: Min guard;+2 safety/equipment Stair Management: Two rails;Step to pattern Number of Stairs: 4 General stair comments: Step to gait, close CGA with VC's for sequencing.  Pt able to complete without rest break. Daughter held pt's hemovac.   Wheelchair Mobility    Modified Rankin (Stroke Patients Only)       Balance Overall balance assessment: Needs assistance Sitting-balance support: Feet supported;No upper extremity supported Sitting balance-Leahy Scale: Good     Standing balance support: Bilateral upper extremity supported Standing balance-Leahy Scale: Good                              Cognition Arousal/Alertness: Awake/alert Behavior During Therapy: WFL for tasks assessed/performed Overall Cognitive Status: Within Functional Limits for tasks assessed                                 General Comments: Very pleasant and eager to work with therapy.      Exercises Total Joint Exercises Quad Sets: Right;Strengthening;15 reps;Supine    General Comments        Pertinent Vitals/Pain Faces Pain Scale: Hurts  little more Pain Location: R knee during STS.    Home Living                      Prior Function            PT Goals (current goals can now be found in the care plan section) Acute Rehab PT Goals Patient Stated Goal: to get up and get things moving PT Goal Formulation: With patient Time For Goal Achievement: 04/28/19 Potential to Achieve Goals: Good Progress towards PT goals: Progressing toward goals    Frequency    BID      PT Plan Current plan remains appropriate    Co-evaluation              AM-PAC PT "6 Clicks" Mobility   Outcome Measure  Help needed  turning from your back to your side while in a flat bed without using bedrails?: None Help needed moving from lying on your back to sitting on the side of a flat bed without using bedrails?: None Help needed moving to and from a bed to a chair (including a wheelchair)?: A Little Help needed standing up from a chair using your arms (e.g., wheelchair or bedside chair)?: A Little Help needed to walk in hospital room?: A Little Help needed climbing 3-5 steps with a railing? : A Little 6 Click Score: 20    End of Session Equipment Utilized During Treatment: Gait belt Activity Tolerance: Patient tolerated treatment well Patient left: with call bell/phone within reach;with nursing/sitter in room;with SCD's reapplied;in chair;with chair alarm set;with family/visitor present(Polar Care in place.) Nurse Communication: Mobility status(Confirmed pt okay for more walking and stairs this p.m. as PT observed pt's Hgn lower than yesterday.) PT Visit Diagnosis: Unsteadiness on feet (R26.81);Muscle weakness (generalized) (M62.81);Difficulty in walking, not elsewhere classified (R26.2);Pain Pain - Right/Left: Right Pain - part of body: Knee     Time: 1546-1610 PT Time Calculation (min) (ACUTE ONLY): 24 min  Charges:  $Therapeutic Exercise: 8-22 mins $Therapeutic Activity: 8-22 mins                     Roxanne Gates, PT, DPT    Roxanne Gates 04/16/2019, 4:45 PM

## 2019-04-16 NOTE — Progress Notes (Signed)
  Subjective: 2 Days Post-Op Procedure(s) (LRB): RIGHT TOTAL KNEE ARTHROPLASTY (Right) Patient reports pain as improving from previous and localized more around the knee cap..    Patient is well, and has had no acute complaints or problems Plan is to go Home after hospital stay. Negative for chest pain and shortness of breath Fever: no Gastrointestinal: Negative for nausea and vomiting  Objective: Vital signs in last 24 hours: Temp:  [98.3 F (36.8 C)-100.9 F (38.3 C)] 98.4 F (36.9 C) (09/19 0203) Pulse Rate:  [65-73] 70 (09/18 2343) BP: (116-136)/(68-80) 116/69 (09/18 2343) SpO2:  [96 %-98 %] 96 % (09/18 2343)  Intake/Output from previous day:  Intake/Output Summary (Last 24 hours) at 04/16/2019 0821 Last data filed at 04/16/2019 0726 Gross per 24 hour  Intake 880.77 ml  Output 0 ml  Net 880.77 ml    Intake/Output this shift: Total I/O In: 640.8 [I.V.:640.8] Out: -   Labs: Recent Labs    04/14/19 0708 04/15/19 0533  HGB 12.9 10.0*   Recent Labs    04/14/19 0708 04/15/19 0533  WBC  --  9.0  RBC  --  3.20*  HCT 38.0 30.5*  PLT  --  148*   Recent Labs    04/14/19 0708 04/15/19 0533  NA 144 139  K 3.6 3.7  CL 109 111  CO2  --  24  BUN 18 14  CREATININE 0.90 0.90  GLUCOSE 109* 117*  CALCIUM  --  8.6*   No results for input(s): LABPT, INR in the last 72 hours.   EXAM General - Patient is Alert, Appropriate and Oriented Extremity - Neurovascular intact Dorsiflexion/Plantar flexion intact Compartment soft Dressing/Incision - clean, dry, no drainage. Wound vac intact  Motor Function - intact, moving foot and toes well on exam. Able to perform a straight leg raise with assistance.    Assessment/Plan: 2 Days Post-Op Procedure(s) (LRB): RIGHT TOTAL KNEE ARTHROPLASTY (Right) Active Problems:   Status post total knee replacement using cement, right  Anemia secondary to surgery, no intervention necessary at this time.  Patient will be d/c'd with  Prevena bandage, to be worn for 1 week after discharge.  Estimated body mass index is 34.85 kg/m as calculated from the following:   Height as of this encounter: 5\' 5"  (1.651 m).   Weight as of this encounter: 95 kg. Up with therapy, will be discharged after bowel movement and completion of stairs with PT. Patient will follow up with outpatient PT.    DVT Prophylaxis - Lovenox Weight-Bearing as tolerated to right leg.  Cassell Smiles, PA-C [facility] [place] [facility] 04/16/2019, 8:21 AM

## 2019-04-16 NOTE — Progress Notes (Signed)
PT Cancellation Note  Patient Details Name: [name] MRN: [id number] DOB: March 12, 1961   Cancelled Treatment:    Reason Eval/Treat Not Completed: Patient declined, no reason specified.  RN in pt's room and assisting pt back to bed when PT arrived.  Pt has completed several transfers to toilet and is feeling unwell.  Requested that PT return later to do stair training.  Will return at end of day.  Roxanne Gates, PT, DPT  Roxanne Gates 04/16/2019, 2:31 PM

## 2019-04-16 NOTE — Plan of Care (Signed)
  Problem: Activity: Goal: Ability to avoid complications of mobility impairment will improve 04/16/2019 1623 by Montel Culver, RN Outcome: Adequate for Discharge 04/16/2019 1411 by Montel Culver, RN Outcome: Progressing Goal: Range of joint motion will improve 04/16/2019 1623 by Montel Culver, RN Outcome: Adequate for Discharge 04/16/2019 1411 by Montel Culver, RN Outcome: Progressing   Problem: Clinical Measurements: Goal: Postoperative complications will be avoided or minimized 04/16/2019 1623 by Montel Culver, RN Outcome: Adequate for Discharge 04/16/2019 1411 by Montel Culver, RN Outcome: Progressing   Problem: Pain Management: Goal: Pain level will decrease with appropriate interventions 04/16/2019 1623 by Montel Culver, RN Outcome: Adequate for Discharge 04/16/2019 1411 by Montel Culver, RN Outcome: Progressing   Problem: Health Behavior/Discharge Planning: Goal: Ability to manage health-related needs will improve 04/16/2019 1623 by Montel Culver, RN Outcome: Adequate for Discharge 04/16/2019 1411 by Montel Culver, RN Outcome: Progressing   Problem: Clinical Measurements: Goal: Ability to maintain clinical measurements within normal limits will improve 04/16/2019 1623 by Montel Culver, RN Outcome: Adequate for Discharge 04/16/2019 1411 by Montel Culver, RN Outcome: Progressing Goal: Will remain free from infection 04/16/2019 1623 by Montel Culver, RN Outcome: Adequate for Discharge 04/16/2019 1411 by Montel Culver, RN Outcome: Progressing Goal: Diagnostic test results will improve 04/16/2019 1623 by Montel Culver, RN Outcome: Adequate for Discharge 04/16/2019 1411 by Montel Culver, RN Outcome: Progressing   Problem: Activity: Goal: Risk for activity intolerance will decrease 04/16/2019 1623 by Montel Culver, RN Outcome: Adequate for Discharge 04/16/2019 1411 by Montel Culver,  RN Outcome: Progressing   Problem: Pain Managment: Goal: General experience of comfort will improve 04/16/2019 1623 by Montel Culver, RN Outcome: Adequate for Discharge 04/16/2019 1411 by Montel Culver, RN Outcome: Progressing   Problem: Safety: Goal: Ability to remain free from injury will improve 04/16/2019 1623 by Montel Culver, RN Outcome: Adequate for Discharge 04/16/2019 1411 by Montel Culver, RN Outcome: Progressing   Problem: Skin Integrity: Goal: Risk for impaired skin integrity will decrease 04/16/2019 1623 by Montel Culver, RN Outcome: Adequate for Discharge 04/16/2019 1411 by Montel Culver, RN Outcome: Progressing

## 2019-04-18 DIAGNOSIS — R531 Weakness: Secondary | ICD-10-CM | POA: Diagnosis not present

## 2019-04-18 DIAGNOSIS — Z96651 Presence of right artificial knee joint: Secondary | ICD-10-CM | POA: Diagnosis not present

## 2019-04-18 DIAGNOSIS — M25561 Pain in right knee: Secondary | ICD-10-CM | POA: Diagnosis not present

## 2019-04-18 DIAGNOSIS — M25661 Stiffness of right knee, not elsewhere classified: Secondary | ICD-10-CM | POA: Diagnosis not present

## 2019-04-20 DIAGNOSIS — Z96651 Presence of right artificial knee joint: Secondary | ICD-10-CM | POA: Diagnosis not present

## 2019-04-20 DIAGNOSIS — M25661 Stiffness of right knee, not elsewhere classified: Secondary | ICD-10-CM | POA: Diagnosis not present

## 2019-04-20 DIAGNOSIS — R531 Weakness: Secondary | ICD-10-CM | POA: Diagnosis not present

## 2019-04-20 DIAGNOSIS — M25561 Pain in right knee: Secondary | ICD-10-CM | POA: Diagnosis not present

## 2019-04-22 DIAGNOSIS — Z96651 Presence of right artificial knee joint: Secondary | ICD-10-CM | POA: Diagnosis not present

## 2019-04-22 DIAGNOSIS — M25661 Stiffness of right knee, not elsewhere classified: Secondary | ICD-10-CM | POA: Diagnosis not present

## 2019-04-22 DIAGNOSIS — R531 Weakness: Secondary | ICD-10-CM | POA: Diagnosis not present

## 2019-04-22 DIAGNOSIS — M25561 Pain in right knee: Secondary | ICD-10-CM | POA: Diagnosis not present

## 2019-04-25 DIAGNOSIS — M25661 Stiffness of right knee, not elsewhere classified: Secondary | ICD-10-CM | POA: Diagnosis not present

## 2019-04-25 DIAGNOSIS — M25561 Pain in right knee: Secondary | ICD-10-CM | POA: Diagnosis not present

## 2019-04-25 DIAGNOSIS — R531 Weakness: Secondary | ICD-10-CM | POA: Diagnosis not present

## 2019-04-25 DIAGNOSIS — Z96651 Presence of right artificial knee joint: Secondary | ICD-10-CM | POA: Diagnosis not present

## 2019-04-27 DIAGNOSIS — Z96651 Presence of right artificial knee joint: Secondary | ICD-10-CM | POA: Diagnosis not present

## 2019-04-27 DIAGNOSIS — M25561 Pain in right knee: Secondary | ICD-10-CM | POA: Diagnosis not present

## 2019-04-27 DIAGNOSIS — R531 Weakness: Secondary | ICD-10-CM | POA: Diagnosis not present

## 2019-04-27 DIAGNOSIS — M25661 Stiffness of right knee, not elsewhere classified: Secondary | ICD-10-CM | POA: Diagnosis not present

## 2019-04-29 DIAGNOSIS — Z96651 Presence of right artificial knee joint: Secondary | ICD-10-CM | POA: Diagnosis not present

## 2019-05-02 DIAGNOSIS — M25561 Pain in right knee: Secondary | ICD-10-CM | POA: Diagnosis not present

## 2019-05-02 DIAGNOSIS — Z96651 Presence of right artificial knee joint: Secondary | ICD-10-CM | POA: Diagnosis not present

## 2019-05-03 ENCOUNTER — Other Ambulatory Visit: Payer: Self-pay

## 2019-05-04 DIAGNOSIS — Z96651 Presence of right artificial knee joint: Secondary | ICD-10-CM | POA: Diagnosis not present

## 2019-05-04 DIAGNOSIS — M25561 Pain in right knee: Secondary | ICD-10-CM | POA: Diagnosis not present

## 2019-05-04 DIAGNOSIS — M25661 Stiffness of right knee, not elsewhere classified: Secondary | ICD-10-CM | POA: Diagnosis not present

## 2019-05-04 DIAGNOSIS — R531 Weakness: Secondary | ICD-10-CM | POA: Diagnosis not present

## 2019-05-05 ENCOUNTER — Other Ambulatory Visit (HOSPITAL_COMMUNITY)
Admission: RE | Admit: 2019-05-05 | Discharge: 2019-05-05 | Disposition: A | Discharge status: Home / Self Care | Payer: Medicare HMO | Source: Ambulatory Visit | Attending: Internal Medicine | Admitting: Internal Medicine

## 2019-05-05 ENCOUNTER — Ambulatory Visit (INDEPENDENT_AMBULATORY_CARE_PROVIDER_SITE_OTHER): Payer: Medicare HMO

## 2019-05-05 ENCOUNTER — Ambulatory Visit (INDEPENDENT_AMBULATORY_CARE_PROVIDER_SITE_OTHER): Payer: Medicare HMO | Admitting: Internal Medicine

## 2019-05-05 ENCOUNTER — Encounter: Payer: Self-pay | Admitting: Internal Medicine

## 2019-05-05 ENCOUNTER — Other Ambulatory Visit: Payer: Self-pay

## 2019-05-05 VITALS — BP 140/78 | HR 91 | Temp 98.1°F | Ht 65.0 in | Wt 209.0 lb

## 2019-05-05 DIAGNOSIS — R300 Dysuria: Secondary | ICD-10-CM

## 2019-05-05 DIAGNOSIS — N760 Acute vaginitis: Secondary | ICD-10-CM

## 2019-05-05 DIAGNOSIS — I25118 Atherosclerotic heart disease of native coronary artery with other forms of angina pectoris: Secondary | ICD-10-CM | POA: Diagnosis not present

## 2019-05-05 DIAGNOSIS — B9689 Other specified bacterial agents as the cause of diseases classified elsewhere: Secondary | ICD-10-CM | POA: Diagnosis not present

## 2019-05-05 DIAGNOSIS — M25552 Pain in left hip: Secondary | ICD-10-CM | POA: Diagnosis not present

## 2019-05-05 DIAGNOSIS — M25551 Pain in right hip: Secondary | ICD-10-CM

## 2019-05-05 DIAGNOSIS — R102 Pelvic and perineal pain: Secondary | ICD-10-CM | POA: Diagnosis not present

## 2019-05-05 DIAGNOSIS — E782 Mixed hyperlipidemia: Secondary | ICD-10-CM

## 2019-05-05 MED ORDER — FLUCONAZOLE 150 MG PO TABS: 150.0000 mg | ORAL_TABLET | Freq: Once | ORAL | 0 refills | Status: AC

## 2019-05-05 MED ORDER — METRONIDAZOLE 500 MG PO TABS: 500.0000 mg | ORAL_TABLET | Freq: Two times a day (BID) | ORAL | 0 refills | Status: DC

## 2019-05-05 NOTE — Patient Instructions (Signed)
Vaginitis Vaginitis is a condition in which the vaginal tissue swells and becomes red (inflamed). This condition is most often caused by a change in the normal balance of bacteria and yeast that live in the vagina. This change causes an overgrowth of certain bacteria or yeast, which causes the inflammation. There are different types of vaginitis, but the most common types are:  Bacterial vaginosis.  Yeast infection (candidiasis).  Trichomoniasis vaginitis. This is a sexually transmitted disease (STD).  Viral vaginitis.  Atrophic vaginitis.  Allergic vaginitis. What are the causes? The cause of this condition depends on the type of vaginitis. It can be caused by:  Bacteria (bacterial vaginosis).  Yeast, which is a fungus (yeast infection).  A parasite (trichomoniasis vaginitis).  A virus (viral vaginitis).  Low hormone levels (atrophic vaginitis). Low hormone levels can occur during pregnancy, breastfeeding, or after menopause.  Irritants, such as bubble baths, scented tampons, and feminine sprays (allergic vaginitis). Other factors can change the normal balance of the yeast and bacteria that live in the vagina. These include:  Antibiotic medicines.  Poor hygiene.  Diaphragms, vaginal sponges, spermicides, birth control pills, and intrauterine devices (IUD).  Sex.  Infection.  Uncontrolled diabetes.  A weakened defense (immune) system. What increases the risk? This condition is more likely to develop in women who:  Smoke.  Use vaginal douches, scented tampons, or scented sanitary pads.  Wear tight-fitting pants.  Wear thong underwear.  Use oral birth control pills or an IUD.  Have sex without a condom.  Have multiple sex partners.  Have an STD.  Frequently use the spermicide nonoxynol-9.  Eat lots of foods high in sugar.  Have uncontrolled diabetes.  Have low estrogen levels.  Have a weakened immune system from an immune disorder or medical  treatment.  Are pregnant or breastfeeding. What are the signs or symptoms? Symptoms vary depending on the cause of the vaginitis. Common symptoms include:  Abnormal vaginal discharge. ? The discharge is white, gray, or yellow with bacterial vaginosis. ? The discharge is thick, white, and cheesy with a yeast infection. ? The discharge is frothy and yellow or greenish with trichomoniasis.  A bad vaginal smell. The smell is fishy with bacterial vaginosis.  Vaginal itching, pain, or swelling.  Sex that is painful.  Pain or burning when urinating. Sometimes there are no symptoms. How is this diagnosed? This condition is diagnosed based on your symptoms and medical history. A physical exam, including a pelvic exam, will also be done. You may also have other tests, including:  Tests to determine the pH level (acidity or alkalinity) of your vagina.  A whiff test, to assess the odor that results when a sample of your vaginal discharge is mixed with a potassium hydroxide solution.  Tests of vaginal fluid. A sample will be examined under a microscope. How is this treated? Treatment varies depending on the type of vaginitis you have. Your treatment may include:  Antibiotic creams or pills to treat bacterial vaginosis and trichomoniasis.  Antifungal medicines, such as vaginal creams or suppositories, to treat a yeast infection.  Medicine to ease discomfort if you have viral vaginitis. Your sexual partner should also be treated.  Estrogen delivered in a cream, pill, suppository, or vaginal ring to treat atrophic vaginitis. If vaginal dryness occurs, lubricants and moisturizing creams may help. You may need to avoid scented soaps, sprays, or douches.  Stopping use of a product that is causing allergic vaginitis. Then using a vaginal cream to treat the symptoms. Follow   these instructions at home: Lifestyle  Keep your genital area clean and dry. Avoid soap, and only rinse the area with  water.  Do not douche or use tampons until your health care provider says it is okay to do so. Use sanitary pads, if needed.  Do not have sex until your health care provider approves. When you can return to sex, practice safe sex and use condoms.  Wipe from front to back. This avoids the spread of bacteria from the rectum to the vagina. General instructions  Take over-the-counter and prescription medicines only as told by your health care provider.  If you were prescribed an antibiotic medicine, take or use it as told by your health care provider. Do not stop taking or using the antibiotic even if you start to feel better.  Keep all follow-up visits as told by your health care provider. This is important. How is this prevented?  Use mild, non-scented products. Do not use things that can irritate the vagina, such as fabric softeners. Avoid the following products if they are scented: ? Feminine sprays. ? Detergents. ? Tampons. ? Feminine hygiene products. ? Soaps or bubble baths.  Let air reach your genital area. ? Wear cotton underwear to reduce moisture buildup. ? Avoid wearing underwear while you sleep. ? Avoid wearing tight pants and underwear or nylons without a cotton panel. ? Avoid wearing thong underwear.  Take off any wet clothing, such as bathing suits, as soon as possible.  Practice safe sex and use condoms. Contact a health care provider if:  You have abdominal pain.  You have a fever.  You have symptoms that last for more than 2-3 days. Get help right away if:  You have a fever and your symptoms suddenly get worse. Summary  Vaginitis is a condition in which the vaginal tissue becomes inflamed.This condition is most often caused by a change in the normal balance of bacteria and yeast that live in the vagina.  Treatment varies depending on the type of vaginitis you have.  Do not douche, use tampons , or have sex until your health care provider approves. When  you can return to sex, practice safe sex and use condoms. This information is not intended to replace advice given to you by your health care provider. Make sure you discuss any questions you have with your health care provider. Document Released: 05/11/2007 Document Revised: 06/26/2017 Document Reviewed: 08/19/2016 [employer] Patient Education  2020 [employer]  

## 2019-05-05 NOTE — Progress Notes (Signed)
Chief Complaint  Patient presents with  . Follow-up   C/o fishy odor since having knee surgery and sat in feces x 2 x. She also has discharge which smells fishy and like bowel which is think and pelvic pain and feeling raw like something pulling on the inside   Knee pain right doing PT 3x per week going down to 2x per week TThurs  CAD HLD having trouble remembering to do repatha 2x per month CC Dr. [name] to see if ok increasing dose to 1 x per month   Chronic b/l hip pain will do Xrays today to w/u arthritis pain has been chronic >1 yea r pain is intermittent     Review of Systems  Constitutional: Negative for weight loss.  HENT: Negative for hearing loss.   Eyes: Negative for blurred vision.  Cardiovascular: Negative for chest pain.  Gastrointestinal: Positive for abdominal pain.  Genitourinary:       +vaginitis    Musculoskeletal: Positive for joint pain.  Skin: Negative for rash.   Past Medical History:  Diagnosis Date  . Allergy   . Anemia   . Arthritis   . Asthma   . Bacterial vaginitis   . Blood in stool   . Brachial neuritis or radiculitis NOS   . 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 being followed by neurosurgery/eye doctor and pcp. monitoring size.causes sinus problems  . Bronchitis   . Cardiac arrhythmia   . Cervical neck pain with evidence of disc disease    C5/6 disease, MRI done late 2012 - no records available  . Chronic combined systolic and diastolic CHF (congestive heart failure) ([place])    a. 08/2010 Echo: mildly reduced EF 40-45%, mild diffuse hypokinesis; b. 06/2016 Echo: EF 50%, no rwma, mild to mod TR; c. 03/2017 Echo: EF 40-45%, Gr1 DD (prior echo reviewed and EF felt to be lower than reported).  . Chronic pain   . Cocaine abuse, in remission ([place])    clean x 24 years  . Colon polyps   . COPD (chronic obstructive pulmonary disease) ([place])    a. 12/2018 PFT: No obvious obst/restrictive dzs.  . Coronary artery disease    a. PCI of  LCX 2003; b. PCI of the LAD 2012 with a (2.5 x 8 mm BMS);  c.s/p CABG 4/12: L-LAD, VG-Dx, VG-OM, VG-RCA (Dr. Prescott Gum);  d. 01/2012 MV: inf infarct, attenuation, no ischemia; e. 02/2017 MV: signifi attenuation artifact. Fixed basal antlat/inflat scar vs artifact. Reversible apical lat and mid antlat defect - ? atten vs ischemia. F/u echo w/o wma->Med rx.  . Depression   . Diabetes mellitus without complication ([place])   . Family history of colon cancer   . Generalized headaches    frequent  . GERD (gastroesophageal reflux disease)   . Headache   . Heart murmur   . Hematochezia   . Hepatitis    history of hepatitis b  . History of cervical cancer    s/p cryotherapy  . History of drug abuse ([place])    cocaine, marijuana, clean since 1989  . History of hepatitis B    from eating undercooked liver  . History of MI (myocardial infarction)   . Hyperlipidemia   . Hypertension   . Ischemic cardiomyopathy    a. 03/2017 Echo: EF 40-45%, Gr1 DD.  [name] Myocardial infarction [facility]) 2003, 2012  . PAD (peripheral artery disease) ([place])    a. s/p Right SFA atherectomy and PTA 01/15/11; b. 07/2018  ABI: R 1.02, L 1.09.  [name] Pituitary mass ([place])    a. 12/2018 MRI Brain: Stable pituitary mass w/ 102mm area of necrosis. Mass abuts R cavernous sinus w/o definite invasion.  . Polyp of colon   . Seasonal allergies   . Sleep apnea    uses cpap  . Smoking history    quit 07/2010  . Thyroid disease   . Urinary incontinence    Past Surgical History:  Procedure Laterality Date  . ABDOMINAL HYSTERECTOMY     2003  . CHOLECYSTECTOMY  1986  . COLONOSCOPY  2008   3 polyps  . COLONOSCOPY WITH PROPOFOL N/A 02/06/2015   Procedure: COLONOSCOPY WITH PROPOFOL;  Surgeon: Lucilla Lame, MD;  Location: [facility] ENDOSCOPY;  Service: Endoscopy;  Laterality: N/A;  . COLONOSCOPY WITH PROPOFOL N/A 01/27/2017   Procedure: COLONOSCOPY WITH PROPOFOL;  Surgeon: Lucilla Lame, MD;  Location: [facility] ENDOSCOPY;  Service: Endoscopy;  Laterality: N/A;  .  CORONARY ANGIOPLASTY     w/ stent placement x2  . CORONARY ARTERY BYPASS GRAFT  2012   Dr [name]  . CORONARY STENT PLACEMENT  2003   S/P MI  . CORONARY STENT PLACEMENT  2007   [place]  . ESOPHAGOGASTRODUODENOSCOPY (EGD) WITH PROPOFOL N/A 02/06/2015   Procedure: ESOPHAGOGASTRODUODENOSCOPY (EGD) WITH PROPOFOL;  Surgeon: Lucilla Lame, MD;  Location: [facility] ENDOSCOPY;  Service: Endoscopy;  Laterality: N/A;  . ESOPHAGOGASTRODUODENOSCOPY (EGD) WITH PROPOFOL N/A 01/27/2017   Procedure: ESOPHAGOGASTRODUODENOSCOPY (EGD) WITH PROPOFOL;  Surgeon: Lucilla Lame, MD;  Location: [facility] ENDOSCOPY;  Service: Endoscopy;  Laterality: N/A;  . FEMORAL ARTERY STENT  10/2010   right sided (Dr. Burt Knack)  . KNEE ARTHROSCOPY WITH MEDIAL MENISECTOMY Right 08/20/2017   Procedure: KNEE ARTHROSCOPY WITH MEDIAL  AND LATERAL MENISECTOMY;  Surgeon: Hessie Knows, MD;  Location: [facility] ORS;  Service: Orthopedics;  Laterality: Right;  . POSTERIOR CERVICAL LAMINECTOMY N/A 03/08/2018   Procedure: POSTERIOR CERVICAL LAMINECTOMY-C7;  Surgeon: Deetta Perla, MD;  Location: [facility] ORS;  Service: Neurosurgery;  Laterality: N/A;  . TONSILLECTOMY    . TOTAL KNEE ARTHROPLASTY Right 04/14/2019   Procedure: RIGHT TOTAL KNEE ARTHROPLASTY;  Surgeon: Hessie Knows, MD;  Location: [facility] ORS;  Service: Orthopedics;  Laterality: Right;  . TUBAL LIGATION     Family History  Problem Relation Age of Onset  . Hypertension Father   . Heart failure Father   . Diabetes Father   . Colon cancer Father 36  . Glaucoma Father   . Cancer Father        colorectal   . Heart disease Father   . Other Father        glaucoma  . Breast cancer Mother 82       breast cancer, late 52's  . Cancer Mother        breast  . Brain cancer Sister   . Arthritis Sister   . Diabetes Sister   . Hypertension Sister   . Kidney disease Sister   . Diabetes Brother   . Hypertension Brother   . Other Sister        brain tumor   . Coronary artery disease Neg Hx   . Stroke Neg Hx     Social History   Socioeconomic History  . Marital status: Legally Separated    Spouse name: Not on file  . Number of children: Not on file  . Years of education: Not on file  . Highest education level: Not on file  Occupational History  . Not on file  Social  Needs  . Financial resource strain: Not on file  . Food insecurity    Worry: Not on file    Inability: Not on file  . Transportation needs    Medical: Not on file    Non-medical: Not on file  Tobacco Use  . Smoking status: Former Smoker    Packs/day: 1.00    Years: 38.00    Pack years: 38.00    Types: Cigarettes    Quit date: 08/12/2010    Years since quitting: 8.7  . Smokeless tobacco: Never Used  Substance and Sexual Activity  . Alcohol use: No  . Drug use: No    Types: Cocaine    Comment: Remote Hx (crack cocaine and marijuana)  . Sexual activity: Yes  Lifestyle  . Physical activity    Days per week: Not on file    Minutes per session: Not on file  . Stress: Not on file  Relationships  . Social Herbalist on phone: Not on file    Gets together: Not on file    Attends religious service: Not on file    Active member of club or organization: Not on file    Attends meetings of clubs or organizations: Not on file    Relationship status: Not on file  . Intimate partner violence    Fear of current or ex partner: Not on file    Emotionally abused: Not on file    Physically abused: Not on file    Forced sexual activity: Not on file  Other Topics Concern  . 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 albuterol (VENTOLIN HFA)  108 (90 Base) MCG/ACT inhaler Inhale 2 puffs into the lungs every 6 (six) hours as needed for wheezing or shortness of breath.  [name] aspirin EC 81 MG tablet Take 1 tablet (81 mg total) by mouth daily.  . Azelastine HCl 0.15 % SOLN Place 2 sprays into both nostrils daily as needed (allergies).   [name] esomeprazole (NEXIUM) 40 MG capsule Take 1 capsule (40 mg total) by mouth daily. Reported on 09/04/2015  . Evolocumab (REPATHA SURECLICK) [id number] MG/ML SOAJ Inject 140 mg into the skin every 14 (fourteen) days.  [name] ezetimibe (ZETIA) 10 MG tablet Take 1 tablet (10 mg total) by mouth daily.  . fluticasone (FLONASE) 50 MCG/ACT nasal spray Place 2 sprays into both nostrils daily as needed for allergies.   . fluticasone (FLOVENT HFA) 110 MCG/ACT inhaler Inhale 1 puff into the lungs 2 (two) times a day. Rinse mouth  . furosemide (LASIX) 20 MG tablet TAKE 1 TABLET BY MOUTH ONCE DAILY AND  A  SECOND  TABLET  IF  NEEDED  FOR  FLUID  BUILD  UP (Patient taking differently: Take 20 mg by mouth See admin instructions. Take 1 tablet (20 mg) by mouth once daily in the morning, may repeat dose if needed for fluid build up)  . glucose blood (ONE TOUCH ULTRA TEST) test strip Use as instructed check cbg qd E11.9  .  ipratropium-albuterol (DUONEB) 0.5-2.5 (3) MG/3ML SOLN Take 3 mLs by nebulization 2 (two) times daily as needed. (Patient taking differently: Take 3 mLs by nebulization 2 (two) times daily as needed (wheezing/shortness of breath). )  . isosorbide mononitrate (IMDUR) 30 MG 24 hr tablet TAKE 1 TABLET BY MOUTH TWICE DAILY . APPOINTMENT REQUIRED FOR FUTURE REFILLS (Patient taking differently: Take 30 mg by mouth 2 (two) times daily. )  . Lactobacillus (ACIDOPHILUS PROBIOTIC) 10 MG CAPS Take 1 capsule by mouth daily.  . Lancets MISC 1 Device by Does not apply route daily. Lancets E11.9  . levocetirizine (XYZAL) 5 MG tablet Take 1 tablet (5 mg total) by mouth at bedtime as needed for allergies.  [name] losartan (COZAAR) 25 MG tablet Take  1 tablet (25 mg total) by mouth daily. (Patient taking differently: Take 25 mg by mouth every evening. )  . meclizine (ANTIVERT) 25 MG tablet Take 25 mg by mouth 3 (three) times daily.   . metFORMIN (GLUCOPHAGE) 500 MG tablet Take 1 tablet (500 mg total) by mouth daily with breakfast.  . methocarbamol (ROBAXIN) 500 MG tablet Take 1 tablet (500 mg total) by mouth every 6 (six) hours as needed for muscle spasms.  . metoprolol tartrate (LOPRESSOR) 25 MG tablet Take 1 tablet (25 mg total) by mouth 2 (two) times daily.  . mirabegron ER (MYRBETRIQ) 25 MG TB24 tablet Take 1 tablet (25 mg total) by mouth daily. (Patient taking differently: Take 25 mg by mouth daily as needed (urinary frequency). )  . mupirocin ointment (BACTROBAN) 2 % Apply 1 application topically 2 (two) times daily. Prn scalp x 1 week and nose x 5 days  . nitroGLYCERIN (NITROSTAT) 0.4 MG SL tablet Place 1 tablet (0.4 mg total) under the tongue every 5 (five) minutes as needed.  [name] oxyCODONE (OXY IR/ROXICODONE) 5 MG immediate release tablet Take 1-2 tablets (5-10 mg total) by mouth every 4 (four) hours as needed for moderate pain (pain score 4-6).  [name] PARoxetine (PAXIL) 10 MG tablet Take 1 tablet (10 mg total) by mouth daily.  . potassium chloride (K-DUR,KLOR-CON) 10 MEQ tablet Take 1 tablet (10 mEq total) by mouth daily.  . psyllium (METAMUCIL SMOOTH TEXTURE) 28 % packet Take 1 packet by mouth 2 (two) times daily.  . rivaroxaban (XARELTO) 2.5 MG TABS tablet Take 1 tablet (2.5 mg total) by mouth 2 (two) times daily.  . rosuvastatin (CRESTOR) 20 MG tablet Take 1 tablet (20 mg total) by mouth at bedtime.  . senna-docusate (SENOKOT-S) 8.6-50 MG tablet Take 1 tablet by mouth daily as needed for mild constipation or moderate constipation.  [name] umeclidinium-vilanterol (ANORO ELLIPTA) 62.5-25 MCG/INH AEPB Inhale 1 puff into the lungs daily.  . Wheat Dextrin (BENEFIBER) POWD 2 teaspoons added to 4-8 ounces of liquid or food up to 3x per day prn    Allergies  Allergen Reactions  . Latex Rash       . Shellfish Allergy Anaphylaxis    Hard shellfish/swelling in throat  . Sulfonamide Derivatives Anaphylaxis    Swelling in throat.  . Watermelon [Citrullus Vulgaris] Other (See Comments)    Throat itchy  . Soy Allergy     Diarrhea/upset stomach    . Other Swelling   Recent Results (from the past 2160 hour(s))  Hemoglobin A1c     Status: None   Collection Time: 02/07/19 10:56 AM  Result Value Ref Range   Hgb A1c MFr Bld 6.5 4.6 - 6.5 %    Comment: Glycemic Control Guidelines for  People with Diabetes:Non Diabetic:  <6%Goal of Therapy: <7%Additional Action Suggested:  >8%   Urine Culture     Status: None   Collection Time: 02/07/19 10:56 AM   Specimen: Blood  Result Value Ref Range   MICRO NUMBER: [id number]    SPECIMEN QUALITY: Adequate    Sample Source URINE    STATUS: FINAL    ISOLATE 1:      Single organism less than 10,000 CFU/mL isolated. These organisms, commonly found on external and internal genitalia, are considered colonizers. No further testing performed.  Urinalysis, Routine w reflex microscopic     Status: Abnormal   Collection Time: 02/07/19 10:56 AM  Result Value Ref Range   Color, Urine YELLOW YELLOW   APPearance CLEAR CLEAR   Specific Gravity, Urine 1.026 1.001 - 1.03   pH 6.0 5.0 - 8.0   Glucose, UA NEGATIVE NEGATIVE   Bilirubin Urine NEGATIVE NEGATIVE   Ketones, ur NEGATIVE NEGATIVE   Hgb urine dipstick NEGATIVE NEGATIVE   Protein, ur NEGATIVE NEGATIVE   Nitrite NEGATIVE NEGATIVE   Leukocytes,Ua 1+ (A) NEGATIVE   WBC, UA 0-5 0 - 5 /HPF   RBC / HPF 0-2 0 - 2 /HPF   Squamous Epithelial / LPF 0-5 < OR = 5 /HPF   Bacteria, UA NONE SEEN NONE SEEN /HPF   Hyaline Cast NONE SEEN NONE SEEN /LPF   Urine-Other FEW MUCOUS THREADS   TSH     Status: None   Collection Time: 02/07/19 10:56 AM  Result Value Ref Range   TSH 1.03 0.35 - 4.50 uIU/mL  Comprehensive metabolic panel     Status: None   Collection  Time: 02/07/19 10:56 AM  Result Value Ref Range   Sodium 143 135 - 145 mEq/L   Potassium 3.9 3.5 - 5.1 mEq/L   Chloride 111 96 - 112 mEq/L   CO2 25 19 - 32 mEq/L   Glucose, Bld 97 70 - 99 mg/dL   BUN 15 6 - 23 mg/dL   Creatinine, Ser 0.85 0.40 - 1.20 mg/dL   Total Bilirubin 0.3 0.2 - 1.2 mg/dL   Alkaline Phosphatase 58 39 - 117 U/L   AST 15 0 - 37 U/L   ALT 17 0 - 35 U/L   Total Protein 6.0 6.0 - 8.3 g/dL   Albumin 4.0 3.5 - 5.2 g/dL   Calcium 8.8 8.4 - 10.5 mg/dL   GFR 82.41 >60.00 mL/min  CBC     Status: None   Collection Time: 04/06/19  1:51 PM  Result Value Ref Range   WBC 7.3 4.0 - 10.5 K/uL   RBC 4.20 3.87 - 5.11 MIL/uL   Hemoglobin 13.1 12.0 - 15.0 g/dL   HCT 39.9 36.0 - 46.0 %   MCV 95.0 80.0 - 100.0 fL   MCH 31.2 26.0 - 34.0 pg   MCHC 32.8 30.0 - 36.0 g/dL   RDW 13.6 11.5 - 15.5 %   Platelets 184 150 - 400 K/uL   nRBC 0.0 0.0 - 0.2 %    Comment: Performed at [facility], [address] [place], [place] [address]  Sedimentation rate     Status: None   Collection Time: 04/06/19  1:51 PM  Result Value Ref Range   Sed Rate 21 0 - 30 mm/hr    Comment: Performed at [facility], [address] [place],  [id number]  Basic metabolic panel     Status: Abnormal   Collection Time: 04/06/19  1:51 PM  Result Value Ref Range   Sodium 142 135 - 145 mmol/L   Potassium 3.3 (L) 3.5 - 5.1 mmol/L   Chloride 104 98 - 111 mmol/L   CO2 26 22 - 32 mmol/L   Glucose, Bld 86 70 - 99 mg/dL   BUN 13 6 - 20 mg/dL   Creatinine, Ser 0.84 0.44 - 1.00 mg/dL   Calcium 9.7 8.9 - 10.3 mg/dL   GFR calc non Af Amer >60 >60 mL/min   GFR calc Af Amer >60 >60 mL/min   Anion gap 12 5 - 15    Comment: Performed at [facility], [place] [place], [place] [address]  Protime-INR     Status: None   Collection Time: 04/06/19  1:51 PM  Result Value Ref Range   Prothrombin Time 12.2 11.4 - 15.2 seconds   INR 0.9 0.8 - 1.2    Comment: (NOTE) INR goal varies  based on device and disease states. Performed at [facility], [address] [place], [place] [address]   APTT     Status: None   Collection Time: 04/06/19  1:51 PM  Result Value Ref Range   aPTT 30 24 - 36 seconds    Comment: Performed at [facility], [address] [place], [place] [address]  Urinalysis, Routine w reflex microscopic     Status: Abnormal   Collection Time: 04/06/19  1:51 PM  Result Value Ref Range   Color, Urine COLORLESS (A) YELLOW   APPearance CLEAR (A) CLEAR   Specific Gravity, Urine 1.006 1.005 - 1.030   pH 6.0 5.0 - 8.0   Glucose, UA NEGATIVE NEGATIVE mg/dL   Hgb urine dipstick NEGATIVE NEGATIVE   Bilirubin Urine NEGATIVE NEGATIVE   Ketones, ur NEGATIVE NEGATIVE mg/dL   Protein, ur NEGATIVE NEGATIVE mg/dL   Nitrite NEGATIVE NEGATIVE   Leukocytes,Ua NEGATIVE NEGATIVE    Comment: Performed at [facility], [address] [place], [place] [address]  Urine culture     Status: Abnormal   Collection Time: 04/06/19  1:51 PM   Specimen: Urine, Random  Result Value Ref Range   Specimen Description      URINE, RANDOM Performed at [facility], [address], [place], [place] [address]    Special Requests      NONE Performed at [facility], [address], [place], [place] [address]    Culture (A)     <10,000 COLONIES/mL INSIGNIFICANT GROWTH Performed at [facility], [place] [address] [place], [place] [address]    Report Status 04/08/2019 FINAL   Surgical pcr screen     Status: None   Collection Time: 04/06/19  1:51 PM   Specimen: Nasal Mucosa; Nasal Swab  Result Value Ref Range   MRSA, PCR NEGATIVE NEGATIVE   Staphylococcus aureus NEGATIVE NEGATIVE    Comment: (NOTE) The Xpert SA Assay (FDA approved for NASAL specimens in patients 29 years of age and older), is one component of a comprehensive surveillance program. It is not intended to diagnose infection nor to guide or monitor  treatment. Performed at [facility], [address] [place], [place] [address]   Type and screen [place]     Status: None   Collection Time: 04/06/19  1:51 PM  Result Value Ref Range   ABO/RH(D) B POS    Antibody Screen NEG    Sample Expiration 04/20/2019,2359    Extend sample reason      NO  TRANSFUSIONS OR PREGNANCY IN THE PAST 3 MONTHS Performed at [facility], [place], [place] [address]   SARS CORONAVIRUS 2 (TAT 6-24 HRS) Nasopharyngeal Nasopharyngeal Swab     Status: None   Collection Time: 04/11/19  9:24 AM   Specimen: Nasopharyngeal Swab  Result Value Ref Range   SARS Coronavirus 2 NEGATIVE NEGATIVE    Comment: (NOTE) SARS-CoV-2 target nucleic acids are NOT DETECTED. The SARS-CoV-2 RNA is generally detectable in upper and lower respiratory specimens during the acute phase of infection. Negative results do not preclude SARS-CoV-2 infection, do not rule out co-infections with other pathogens, and should not be used as the sole basis for treatment or other patient management decisions. Negative results must be combined with clinical observations, patient history, and epidemiological information. The expected result is Negative. Fact Sheet for Patients: SugarRoll.be Fact Sheet for Healthcare Providers: https://www.woods-mathews.com/ This test is not yet approved or cleared by the [place] FDA and  has been authorized for detection and/or diagnosis of SARS-CoV-2 by FDA under an Emergency Use Authorization (EUA). This EUA will remain  in effect (meaning this test can be used) for the duration of the COVID-19 declaration under Section 56 4(b)(1) of the Act, 21 U.S.C. section 360bbb-3(b)(1), unless the authorization is terminated or revoked sooner. Performed at [facility], [place] [address], [place], [place] [address]   Glucose, capillary     Status: Abnormal    Collection Time: 04/14/19  6:17 AM  Result Value Ref Range   Glucose-Capillary 118 (H) 70 - 99 mg/dL  Urine Drug Screen, Qualitative ([facility] only)     Status: Abnormal   Collection Time: 04/14/19  6:20 AM  Result Value Ref Range   Tricyclic, Ur Screen NONE DETECTED NONE DETECTED   Amphetamines, Ur Screen NONE DETECTED NONE DETECTED   MDMA (Ecstasy)Ur Screen NONE DETECTED NONE DETECTED   Cocaine Metabolite,Ur [place] NONE DETECTED NONE DETECTED   Opiate, Ur Screen NONE DETECTED NONE DETECTED   Phencyclidine (PCP) Ur S NONE DETECTED NONE DETECTED   Cannabinoid 50 Ng, Ur [place] POSITIVE (A) NONE DETECTED   Barbiturates, Ur Screen NONE DETECTED NONE DETECTED   Benzodiazepine, Ur Scrn NONE DETECTED NONE DETECTED   Methadone Scn, Ur NONE DETECTED NONE DETECTED    Comment: (NOTE) Tricyclics + metabolites, urine    Cutoff 1000 ng/mL Amphetamines + metabolites, urine  Cutoff 1000 ng/mL MDMA (Ecstasy), urine              Cutoff 500 ng/mL Cocaine Metabolite, urine          Cutoff 300 ng/mL Opiate + metabolites, urine        Cutoff 300 ng/mL Phencyclidine (PCP), urine         Cutoff 25 ng/mL Cannabinoid, urine                 Cutoff 50 ng/mL Barbiturates + metabolites, urine  Cutoff 200 ng/mL Benzodiazepine, urine              Cutoff 200 ng/mL Methadone, urine                   Cutoff 300 ng/mL The urine drug screen provides only a preliminary, unconfirmed analytical test result and should not be used for non-medical purposes. Clinical consideration and professional judgment should be applied to any positive drug screen result due to possible interfering substances. A more specific alternate chemical method must be used in order to obtain a confirmed analytical result. Gas  chromatography / mass spectrometry (GC/MS) is the preferred confirmat ory method. Performed at [facility], [address] [place], [place] [address]   I-STAT, [place] 8     Status: Abnormal   Collection Time: 04/14/19   7:08 AM  Result Value Ref Range   Sodium 144 135 - 145 mmol/L   Potassium 3.6 3.5 - 5.1 mmol/L   Chloride 109 98 - 111 mmol/L   BUN 18 6 - 20 mg/dL   Creatinine, Ser 0.90 0.44 - 1.00 mg/dL   Glucose, Bld 109 (H) 70 - 99 mg/dL   Calcium, Ion 1.18 1.15 - 1.40 mmol/L   TCO2 23 22 - 32 mmol/L   Hemoglobin 12.9 12.0 - 15.0 g/dL   HCT 38.0 36.0 - 46.0 %  Glucose, capillary     Status: None   Collection Time: 04/14/19  9:32 AM  Result Value Ref Range   Glucose-Capillary 92 70 - 99 mg/dL  Glucose, capillary     Status: None   Collection Time: 04/14/19 11:33 AM  Result Value Ref Range   Glucose-Capillary 90 70 - 99 mg/dL  Glucose, capillary     Status: None   Collection Time: 04/14/19  4:50 PM  Result Value Ref Range   Glucose-Capillary 91 70 - 99 mg/dL  Glucose, capillary     Status: Abnormal   Collection Time: 04/14/19  9:12 PM  Result Value Ref Range   Glucose-Capillary 102 (H) 70 - 99 mg/dL  CBC     Status: Abnormal   Collection Time: 04/15/19  5:33 AM  Result Value Ref Range   WBC 9.0 4.0 - 10.5 K/uL   RBC 3.20 (L) 3.87 - 5.11 MIL/uL   Hemoglobin 10.0 (L) 12.0 - 15.0 g/dL   HCT 30.5 (L) 36.0 - 46.0 %   MCV 95.3 80.0 - 100.0 fL   MCH 31.3 26.0 - 34.0 pg   MCHC 32.8 30.0 - 36.0 g/dL   RDW 14.0 11.5 - 15.5 %   Platelets 148 (L) 150 - 400 K/uL   nRBC 0.0 0.0 - 0.2 %    Comment: Performed at [facility], [address] [place], [place] [id number]  Basic metabolic panel     Status: Abnormal   Collection Time: 04/15/19  5:33 AM  Result Value Ref Range   Sodium 139 135 - 145 mmol/L   Potassium 3.7 3.5 - 5.1 mmol/L   Chloride 111 98 - 111 mmol/L   CO2 24 22 - 32 mmol/L   Glucose, Bld 117 (H) 70 - 99 mg/dL   BUN 14 6 - 20 mg/dL   Creatinine, Ser 0.90 0.44 - 1.00 mg/dL   Calcium 8.6 (L) 8.9 - 10.3 mg/dL   GFR calc non Af Amer >60 >60 mL/min   GFR calc Af Amer >60 >60 mL/min   Anion gap 4 (L) 5 - 15    Comment: Performed at [facility], [address] [place], [place] [address]  Glucose, capillary     Status: Abnormal   Collection Time: 04/15/19  8:01 AM  Result Value Ref Range   Glucose-Capillary 104 (H) 70 - 99 mg/dL  Glucose, capillary     Status: Abnormal   Collection Time: 04/15/19 12:00 PM  Result Value Ref Range   Glucose-Capillary 118 (H) 70 - 99 mg/dL  Glucose, capillary     Status: Abnormal   Collection Time: 04/15/19  4:41 PM  Result Value Ref Range   Glucose-Capillary 107 (H) 70 - 99  mg/dL  Glucose, capillary     Status: Abnormal   Collection Time: 04/15/19 10:10 PM  Result Value Ref Range   Glucose-Capillary 117 (H) 70 - 99 mg/dL  CBC     Status: Abnormal   Collection Time: 04/16/19  8:32 AM  Result Value Ref Range   WBC 9.9 4.0 - 10.5 K/uL   RBC 3.21 (L) 3.87 - 5.11 MIL/uL   Hemoglobin 9.9 (L) 12.0 - 15.0 g/dL   HCT 30.4 (L) 36.0 - 46.0 %   MCV 94.7 80.0 - 100.0 fL   MCH 30.8 26.0 - 34.0 pg   MCHC 32.6 30.0 - 36.0 g/dL   RDW 13.7 11.5 - 15.5 %   Platelets 129 (L) 150 - 400 K/uL   nRBC 0.0 0.0 - 0.2 %    Comment: Performed at [facility], [address] [place], [place] [address]  Glucose, capillary     Status: Abnormal   Collection Time: 04/16/19  8:38 AM  Result Value Ref Range   Glucose-Capillary 130 (H) 70 - 99 mg/dL  Glucose, capillary     Status: Abnormal   Collection Time: 04/16/19 11:48 AM  Result Value Ref Range   Glucose-Capillary 120 (H) 70 - 99 mg/dL   Objective  Body mass index is 34.78 kg/m. Wt Readings from Last 3 Encounters:  05/05/19 209 lb (94.8 kg)  04/14/19 209 lb 7 oz (95 kg)  04/06/19 211 lb (95.7 kg)   Temp Readings from Last 3 Encounters:  05/05/19 98.1 F (36.7 C) (Oral)  04/16/19 98.6 F (37 C) (Oral)  03/29/19 98.4 F (36.9 C) (Oral)   BP Readings from Last 3 Encounters:  05/05/19 140/78  04/16/19 (!) 126/112  04/06/19 140/84   Pulse Readings from Last 3 Encounters:  05/05/19 91  04/16/19 76  04/06/19 (!) 59    Physical Exam Vitals signs and  nursing note reviewed.  Constitutional:      Appearance: Normal appearance. She is well-developed and well-groomed. She is obese.  HENT:     Head: Normocephalic and atraumatic.     Comments: +mask on   Eyes:     Conjunctiva/sclera: Conjunctivae normal.     Pupils: Pupils are equal, round, and reactive to light.  Cardiovascular:     Rate and Rhythm: Normal rate and regular rhythm.     Heart sounds: Normal heart sounds.  Pulmonary:     Effort: Pulmonary effort is normal.     Breath sounds: Normal breath sounds.  Abdominal:     Tenderness: There is abdominal tenderness in the suprapubic area and left lower quadrant.  Skin:    General: Skin is warm and dry.  Neurological:     General: No focal deficit present.     Mental Status: She is alert and oriented to person, place, and time. Mental status is at baseline.     Comments: Walking with cane and limping today   Psychiatric:        Attention and Perception: Attention and perception normal.        Mood and Affect: Mood and affect normal.        Speech: Speech normal.        Behavior: Behavior normal. Behavior is cooperative.        Thought Content: Thought content normal.        Cognition and Memory: Cognition and memory normal.        Judgment: Judgment normal.     Assessment  Plan  Female pelvic  pain - Plan: Urine cytology ancillary only([place]), Urinalysis, Routine w reflex microscopic, Urine Culture  Acute vaginitis - Plan: Urine cytology ancillary only([place]), Urinalysis, Routine w reflex microscopic, Urine Culture, fluconazole (DIFLUCAN) 150 MG tablet, metroNIDAZOLE (FLAGYL) 500 MG tablet  Dysuria - Plan: Urine cytology ancillary only([place]), Urinalysis, Routine w reflex microscopic, Urine Culture  Bilateral hip pain - Plan: DG HIP UNILAT WITH PELVIS 2-3 VIEWS LEFT, DG HIP UNILAT WITH PELVIS 2-3 VIEWS RIGHT   HLD/CAD -will CC Dr. [name] to see if can do repatha 1x per month   HM Flu shot  utd 05/05/19 given Rx Tdap shingrix in future at pharmacy  pna 23 had 03/10/16  S/p hysterectomy no cervix and f/u OB/GYNh/o abnormal pap s/p cryo  -pap 05/08/15 neg papwill Repeat In 5 years   Colonoscopy Dr. Allen Norris 01/2017 polyps, gastritis, diverticulosis  Mammogram3/18/2020 negative  DEXAneg 10/13/2018  Provider: Dr. Olivia Mackie [name]-Internal Medicine

## 2019-05-06 DIAGNOSIS — R531 Weakness: Secondary | ICD-10-CM | POA: Diagnosis not present

## 2019-05-06 DIAGNOSIS — M25661 Stiffness of right knee, not elsewhere classified: Secondary | ICD-10-CM | POA: Diagnosis not present

## 2019-05-06 DIAGNOSIS — Z96651 Presence of right artificial knee joint: Secondary | ICD-10-CM | POA: Diagnosis not present

## 2019-05-06 DIAGNOSIS — M25561 Pain in right knee: Secondary | ICD-10-CM | POA: Diagnosis not present

## 2019-05-06 LAB — URINALYSIS, ROUTINE W REFLEX MICROSCOPIC
Bacteria, UA: NONE SEEN /HPF
Bilirubin Urine: NEGATIVE
Glucose, UA: NEGATIVE
Hgb urine dipstick: NEGATIVE
Hyaline Cast: NONE SEEN /LPF
Ketones, ur: NEGATIVE
Nitrite: NEGATIVE
RBC / HPF: NONE SEEN /HPF (ref 0–2)
Specific Gravity, Urine: 1.031 (ref 1.001–1.03)
pH: 6 (ref 5.0–8.0)

## 2019-05-06 LAB — URINE CULTURE
MICRO NUMBER:: 969155
SPECIMEN QUALITY:: ADEQUATE

## 2019-05-09 ENCOUNTER — Other Ambulatory Visit: Payer: Self-pay | Admitting: Cardiovascular Disease

## 2019-05-10 DIAGNOSIS — G4733 Obstructive sleep apnea (adult) (pediatric): Secondary | ICD-10-CM | POA: Diagnosis not present

## 2019-05-10 DIAGNOSIS — I259 Chronic ischemic heart disease, unspecified: Secondary | ICD-10-CM | POA: Diagnosis not present

## 2019-05-10 DIAGNOSIS — Z96651 Presence of right artificial knee joint: Secondary | ICD-10-CM | POA: Diagnosis not present

## 2019-05-10 DIAGNOSIS — G4710 Hypersomnia, unspecified: Secondary | ICD-10-CM | POA: Diagnosis not present

## 2019-05-11 DIAGNOSIS — G4710 Hypersomnia, unspecified: Secondary | ICD-10-CM | POA: Diagnosis not present

## 2019-05-11 DIAGNOSIS — I259 Chronic ischemic heart disease, unspecified: Secondary | ICD-10-CM | POA: Diagnosis not present

## 2019-05-11 DIAGNOSIS — G4733 Obstructive sleep apnea (adult) (pediatric): Secondary | ICD-10-CM | POA: Diagnosis not present

## 2019-05-12 DIAGNOSIS — M25561 Pain in right knee: Secondary | ICD-10-CM | POA: Diagnosis not present

## 2019-05-12 DIAGNOSIS — Z96651 Presence of right artificial knee joint: Secondary | ICD-10-CM | POA: Diagnosis not present

## 2019-05-12 NOTE — Progress Notes (Signed)
Can we check with the repatha rep, See if she can do both of her shots at the same time rather than every 2 weeks

## 2019-05-16 LAB — URINE CYTOLOGY ANCILLARY ONLY
Bacterial Vaginitis-Urine: POSITIVE — AB
Bacterial Vaginitis-Urine: POSITIVE — AB
Candida Urine: NEGATIVE
Chlamydia: NEGATIVE
Comment: NEGATIVE
Comment: NEGATIVE
Comment: NORMAL
Neisseria Gonorrhea: NEGATIVE
Trichomonas: NEGATIVE

## 2019-05-17 ENCOUNTER — Telehealth: Payer: Self-pay | Admitting: *Deleted

## 2019-05-17 DIAGNOSIS — R531 Weakness: Secondary | ICD-10-CM | POA: Diagnosis not present

## 2019-05-17 DIAGNOSIS — Z96651 Presence of right artificial knee joint: Secondary | ICD-10-CM | POA: Diagnosis not present

## 2019-05-17 DIAGNOSIS — M25561 Pain in right knee: Secondary | ICD-10-CM | POA: Diagnosis not present

## 2019-05-17 DIAGNOSIS — M25661 Stiffness of right knee, not elsewhere classified: Secondary | ICD-10-CM | POA: Diagnosis not present

## 2019-05-17 MED ORDER — REPATHA PUSHTRONEX SYSTEM 420 MG/3.5ML ~~LOC~~ SOCT: 1.0000 | SUBCUTANEOUS | 12 refills | Status: DC

## 2019-05-17 NOTE — Telephone Encounter (Signed)
-----   Message from Leeroy Bock, [facility] sent at 05/16/2019 11:55 AM EDT ----- I haven't changed many patients between formulations - I would just try sending in an rx and seeing if her pharmacy can fill it - we can always submit a new prior auth if needed. ----- Message ----- From: Vanessa Ralphs, RN Sent: 05/16/2019  11:53 AM EDT To: Leeroy Bock, RPH  Should this be approved by her insurance if she's already taking the other [place]? Curious if we may hit any road blocks in the process from your experience?  Thanks! ----- Message ----- From: Leeroy Bock, [facility] Sent: 05/16/2019   9:45 AM EDT To: Vanessa Ralphs, RN  It's not an FDA approved dose to give both shots at once, but the [place] does come in a once monthly dosage form. It's a 420mg  once monthly infusion that's given over 8-9 minutes. The pt has to place the cartridge on their stomach where the medication slowly infuses.   Thanks, Jinny Blossom ----- Message ----- From: Vanessa Ralphs, RN Sent: 05/16/2019   9:40 AM EDT To: [facility]  Hi,  See Dr Donivan Scull request. He would like to do both repatha shots once a month. He wanted [place] to check with the rep but I thought you guys would know the answer as well. PLease advise.  Thanks, Lars Pinks, RN ----- Message ----- From: Minna Merritts, MD Sent: 05/12/2019   6:46 PM EDT To: Rebeca Alert Burl Triage    ----- Message ----- From: [name], [name], MD Sent: 05/05/2019   9:47 AM EDT To: Minna Merritts, MD  HLD/CAD -will CC Dr. [name] to see if can do repatha 1x per month having trouble remembering to do 2x per month

## 2019-05-17 NOTE — Telephone Encounter (Signed)
Patient is agreeable to try the once a month injection infusion of Repatha. Sent to pharmacy.  Routing to Dr [name] to make him aware.

## 2019-05-19 DIAGNOSIS — R531 Weakness: Secondary | ICD-10-CM | POA: Diagnosis not present

## 2019-05-19 DIAGNOSIS — Z96651 Presence of right artificial knee joint: Secondary | ICD-10-CM | POA: Diagnosis not present

## 2019-05-19 DIAGNOSIS — M25561 Pain in right knee: Secondary | ICD-10-CM | POA: Diagnosis not present

## 2019-05-19 DIAGNOSIS — M25661 Stiffness of right knee, not elsewhere classified: Secondary | ICD-10-CM | POA: Diagnosis not present

## 2019-05-23 DIAGNOSIS — M25561 Pain in right knee: Secondary | ICD-10-CM | POA: Diagnosis not present

## 2019-05-23 DIAGNOSIS — M25661 Stiffness of right knee, not elsewhere classified: Secondary | ICD-10-CM | POA: Diagnosis not present

## 2019-05-23 DIAGNOSIS — Z96651 Presence of right artificial knee joint: Secondary | ICD-10-CM | POA: Diagnosis not present

## 2019-05-23 DIAGNOSIS — R531 Weakness: Secondary | ICD-10-CM | POA: Diagnosis not present

## 2019-05-25 DIAGNOSIS — M25561 Pain in right knee: Secondary | ICD-10-CM | POA: Diagnosis not present

## 2019-05-26 ENCOUNTER — Other Ambulatory Visit: Payer: Self-pay | Admitting: Cardiovascular Disease

## 2019-05-27 DIAGNOSIS — R531 Weakness: Secondary | ICD-10-CM | POA: Diagnosis not present

## 2019-05-27 DIAGNOSIS — M25561 Pain in right knee: Secondary | ICD-10-CM | POA: Diagnosis not present

## 2019-05-27 DIAGNOSIS — M25661 Stiffness of right knee, not elsewhere classified: Secondary | ICD-10-CM | POA: Diagnosis not present

## 2019-05-27 DIAGNOSIS — Z96651 Presence of right artificial knee joint: Secondary | ICD-10-CM | POA: Diagnosis not present

## 2019-05-31 DIAGNOSIS — Z96651 Presence of right artificial knee joint: Secondary | ICD-10-CM | POA: Diagnosis not present

## 2019-05-31 DIAGNOSIS — R531 Weakness: Secondary | ICD-10-CM | POA: Diagnosis not present

## 2019-05-31 DIAGNOSIS — M25561 Pain in right knee: Secondary | ICD-10-CM | POA: Diagnosis not present

## 2019-05-31 DIAGNOSIS — M25661 Stiffness of right knee, not elsewhere classified: Secondary | ICD-10-CM | POA: Diagnosis not present

## 2019-06-02 DIAGNOSIS — Z96651 Presence of right artificial knee joint: Secondary | ICD-10-CM | POA: Diagnosis not present

## 2019-06-02 DIAGNOSIS — M25661 Stiffness of right knee, not elsewhere classified: Secondary | ICD-10-CM | POA: Diagnosis not present

## 2019-06-02 DIAGNOSIS — M25561 Pain in right knee: Secondary | ICD-10-CM | POA: Diagnosis not present

## 2019-06-02 DIAGNOSIS — R531 Weakness: Secondary | ICD-10-CM | POA: Diagnosis not present

## 2019-06-03 ENCOUNTER — Ambulatory Visit: Payer: Medicare HMO | Admitting: Nurse Practitioner

## 2019-06-07 DIAGNOSIS — M25561 Pain in right knee: Secondary | ICD-10-CM | POA: Diagnosis not present

## 2019-06-07 DIAGNOSIS — M25661 Stiffness of right knee, not elsewhere classified: Secondary | ICD-10-CM | POA: Diagnosis not present

## 2019-06-07 DIAGNOSIS — R531 Weakness: Secondary | ICD-10-CM | POA: Diagnosis not present

## 2019-06-07 DIAGNOSIS — Z96651 Presence of right artificial knee joint: Secondary | ICD-10-CM | POA: Diagnosis not present

## 2019-06-07 NOTE — Telephone Encounter (Signed)
Patient calling Has changed mind on taking repatha once a month Would like to go back to Repatha every 14 days Please call to discuss

## 2019-06-08 MED ORDER — REPATHA SURECLICK 140 MG/ML ~~LOC~~ SOAJ: 1.0000 "pen " | SUBCUTANEOUS | 10 refills | Status: DC

## 2019-06-08 NOTE — Telephone Encounter (Signed)
Spoke with the patient. She is wanting to switch back to the bi-monthly dosing of Repatha. She has talked with her pharmacist and they will help with reminders to administer her Repatha every 14 days. She has pick up one dosing of the Reptha monthly infusion. Advised the patient that I will send the Rx to her pharmacy and fwd an FYI update to Dr. [name]. Patient voiced appreciation for the assistance and the call back.

## 2019-06-09 ENCOUNTER — Ambulatory Visit: Payer: Medicare HMO | Admitting: Nurse Practitioner

## 2019-06-11 DIAGNOSIS — G4733 Obstructive sleep apnea (adult) (pediatric): Secondary | ICD-10-CM | POA: Diagnosis not present

## 2019-06-11 DIAGNOSIS — I259 Chronic ischemic heart disease, unspecified: Secondary | ICD-10-CM | POA: Diagnosis not present

## 2019-06-11 DIAGNOSIS — G4710 Hypersomnia, unspecified: Secondary | ICD-10-CM | POA: Diagnosis not present

## 2019-06-14 ENCOUNTER — Other Ambulatory Visit: Payer: Self-pay | Admitting: Internal Medicine

## 2019-06-14 DIAGNOSIS — M25561 Pain in right knee: Secondary | ICD-10-CM | POA: Diagnosis not present

## 2019-06-14 DIAGNOSIS — K219 Gastro-esophageal reflux disease without esophagitis: Secondary | ICD-10-CM

## 2019-06-14 MED ORDER — ESOMEPRAZOLE MAGNESIUM 40 MG PO CPDR: 40.0000 mg | DELAYED_RELEASE_CAPSULE | Freq: Every day | ORAL | 3 refills | Status: DC

## 2019-06-16 DIAGNOSIS — R531 Weakness: Secondary | ICD-10-CM | POA: Diagnosis not present

## 2019-06-16 DIAGNOSIS — M25661 Stiffness of right knee, not elsewhere classified: Secondary | ICD-10-CM | POA: Diagnosis not present

## 2019-06-16 DIAGNOSIS — Z96651 Presence of right artificial knee joint: Secondary | ICD-10-CM | POA: Diagnosis not present

## 2019-06-16 DIAGNOSIS — M25561 Pain in right knee: Secondary | ICD-10-CM | POA: Diagnosis not present

## 2019-06-20 DIAGNOSIS — G4733 Obstructive sleep apnea (adult) (pediatric): Secondary | ICD-10-CM | POA: Diagnosis not present

## 2019-06-21 DIAGNOSIS — R531 Weakness: Secondary | ICD-10-CM | POA: Diagnosis not present

## 2019-06-21 DIAGNOSIS — M25661 Stiffness of right knee, not elsewhere classified: Secondary | ICD-10-CM | POA: Diagnosis not present

## 2019-06-21 DIAGNOSIS — M25561 Pain in right knee: Secondary | ICD-10-CM | POA: Diagnosis not present

## 2019-06-21 DIAGNOSIS — Z96651 Presence of right artificial knee joint: Secondary | ICD-10-CM | POA: Diagnosis not present

## 2019-06-28 DIAGNOSIS — Z96651 Presence of right artificial knee joint: Secondary | ICD-10-CM | POA: Diagnosis not present

## 2019-06-28 DIAGNOSIS — M25561 Pain in right knee: Secondary | ICD-10-CM | POA: Diagnosis not present

## 2019-06-28 DIAGNOSIS — M25661 Stiffness of right knee, not elsewhere classified: Secondary | ICD-10-CM | POA: Diagnosis not present

## 2019-06-28 DIAGNOSIS — R531 Weakness: Secondary | ICD-10-CM | POA: Diagnosis not present

## 2019-06-30 DIAGNOSIS — M25561 Pain in right knee: Secondary | ICD-10-CM | POA: Diagnosis not present

## 2019-06-30 DIAGNOSIS — Z96651 Presence of right artificial knee joint: Secondary | ICD-10-CM | POA: Diagnosis not present

## 2019-06-30 DIAGNOSIS — M25661 Stiffness of right knee, not elsewhere classified: Secondary | ICD-10-CM | POA: Diagnosis not present

## 2019-06-30 DIAGNOSIS — R531 Weakness: Secondary | ICD-10-CM | POA: Diagnosis not present

## 2019-07-05 ENCOUNTER — Other Ambulatory Visit: Payer: Self-pay | Admitting: Cardiovascular Disease

## 2019-07-11 DIAGNOSIS — I259 Chronic ischemic heart disease, unspecified: Secondary | ICD-10-CM | POA: Diagnosis not present

## 2019-07-11 DIAGNOSIS — G4710 Hypersomnia, unspecified: Secondary | ICD-10-CM | POA: Diagnosis not present

## 2019-07-11 DIAGNOSIS — G4733 Obstructive sleep apnea (adult) (pediatric): Secondary | ICD-10-CM | POA: Diagnosis not present

## 2019-07-14 ENCOUNTER — Encounter: Payer: Self-pay | Admitting: Internal Medicine

## 2019-07-14 ENCOUNTER — Other Ambulatory Visit: Payer: Self-pay

## 2019-07-14 ENCOUNTER — Ambulatory Visit (INDEPENDENT_AMBULATORY_CARE_PROVIDER_SITE_OTHER): Payer: Medicare HMO | Admitting: Internal Medicine

## 2019-07-14 VITALS — BP 112/73 | HR 84 | Temp 97.7°F | Ht 65.0 in | Wt 200.4 lb

## 2019-07-14 DIAGNOSIS — Z1231 Encounter for screening mammogram for malignant neoplasm of breast: Secondary | ICD-10-CM | POA: Diagnosis not present

## 2019-07-14 DIAGNOSIS — F419 Anxiety disorder, unspecified: Secondary | ICD-10-CM | POA: Diagnosis not present

## 2019-07-14 DIAGNOSIS — F329 Major depressive disorder, single episode, unspecified: Secondary | ICD-10-CM | POA: Diagnosis not present

## 2019-07-14 DIAGNOSIS — M5416 Radiculopathy, lumbar region: Secondary | ICD-10-CM | POA: Diagnosis not present

## 2019-07-14 DIAGNOSIS — F32A Depression, unspecified: Secondary | ICD-10-CM

## 2019-07-14 DIAGNOSIS — R69 Illness, unspecified: Secondary | ICD-10-CM | POA: Diagnosis not present

## 2019-07-14 DIAGNOSIS — D649 Anemia, unspecified: Secondary | ICD-10-CM

## 2019-07-14 DIAGNOSIS — I10 Essential (primary) hypertension: Secondary | ICD-10-CM

## 2019-07-14 DIAGNOSIS — E119 Type 2 diabetes mellitus without complications: Secondary | ICD-10-CM

## 2019-07-14 MED ORDER — HYDROXYZINE HCL 25 MG PO TABS: 25.0000 mg | ORAL_TABLET | Freq: Every day | ORAL | 2 refills | Status: DC | PRN

## 2019-07-14 MED ORDER — PAROXETINE HCL 20 MG PO TABS: 20.0000 mg | ORAL_TABLET | Freq: Every day | ORAL | 3 refills | Status: DC

## 2019-07-14 NOTE — Patient Instructions (Signed)
Stress relax brand Tranquil sleep 1-2 pills at night (from [employer] or [employer]) take at night* OR Petra Kuba made L theanine 100-200 mg daily at night  Insomnia Insomnia is a sleep disorder that makes it difficult to fall asleep or stay asleep. Insomnia can cause fatigue, low energy, difficulty concentrating, mood swings, and poor performance at work or school. There are three different ways to classify insomnia:  Difficulty falling asleep.  Difficulty staying asleep.  Waking up too early in the morning. Any type of insomnia can be long-term (chronic) or short-term (acute). Both are common. Short-term insomnia usually lasts for three months or less. Chronic insomnia occurs at least three times a week for longer than three months. What are the causes? Insomnia may be caused by another condition, situation, or substance, such as:  Anxiety.  Certain medicines.  Gastroesophageal reflux disease (GERD) or other gastrointestinal conditions.  Asthma or other breathing conditions.  Restless legs syndrome, sleep apnea, or other sleep disorders.  Chronic pain.  Menopause.  Stroke.  Abuse of alcohol, tobacco, or illegal drugs.  Mental health conditions, such as depression.  Caffeine.  Neurological disorders, such as Alzheimer's disease.  An overactive thyroid (hyperthyroidism). Sometimes, the cause of insomnia may not be known. What increases the risk? Risk factors for insomnia include:  Gender. Women are affected more often than men.  Age. Insomnia is more common as you get older.  Stress.  Lack of exercise.  Irregular work schedule or working night shifts.  Traveling between different time zones.  Certain medical and mental health conditions. What are the signs or symptoms? If you have insomnia, the main symptom is having trouble falling asleep or having trouble staying asleep. This may lead to other symptoms, such as:  Feeling fatigued or having low  energy.  Feeling nervous about going to sleep.  Not feeling rested in the morning.  Having trouble concentrating.  Feeling irritable, anxious, or depressed. How is this diagnosed? This condition may be diagnosed based on:  Your symptoms and medical history. Your health care provider may ask about: ? Your sleep habits. ? Any medical conditions you have. ? Your mental health.  A physical exam. How is this treated? Treatment for insomnia depends on the cause. Treatment may focus on treating an underlying condition that is causing insomnia. Treatment may also include:  Medicines to help you sleep.  Counseling or therapy.  Lifestyle adjustments to help you sleep better. Follow these instructions at home: Eating and drinking   Limit or avoid alcohol, caffeinated beverages, and cigarettes, especially close to bedtime. These can disrupt your sleep.  Do not eat a large meal or eat spicy foods right before bedtime. This can lead to digestive discomfort that can make it hard for you to sleep. Sleep habits   Keep a sleep diary to help you and your health care provider figure out what could be causing your insomnia. Write down: ? When you sleep. ? When you wake up during the night. ? How well you sleep. ? How rested you feel the next day. ? Any side effects of medicines you are taking. ? What you eat and drink.  Make your bedroom a dark, comfortable place where it is easy to fall asleep. ? Put up shades or blackout curtains to block light from outside. ? Use a white noise machine to block noise. ? Keep the temperature cool.  Limit screen use before bedtime. This includes: ? Watching TV. ? Using your smartphone, tablet, or computer.  Stick to a routine that includes going to bed and waking up at the same times every day and night. This can help you fall asleep faster. Consider making a quiet activity, such as reading, part of your nighttime routine.  Try to avoid taking naps  during the day so that you sleep better at night.  Get out of bed if you are still awake after 15 minutes of trying to sleep. Keep the lights down, but try reading or doing a quiet activity. When you feel sleepy, go back to bed. General instructions  Take over-the-counter and prescription medicines only as told by your health care provider.  Exercise regularly, as told by your health care provider. Avoid exercise starting several hours before bedtime.  Use relaxation techniques to manage stress. Ask your health care provider to suggest some techniques that may work well for you. These may include: ? Breathing exercises. ? Routines to release muscle tension. ? Visualizing peaceful scenes.  Make sure that you drive carefully. Avoid driving if you feel very sleepy.  Keep all follow-up visits as told by your health care provider. This is important. Contact a health care provider if:  You are tired throughout the day.  You have trouble in your daily routine due to sleepiness.  You continue to have sleep problems, or your sleep problems get worse. Get help right away if:  You have serious thoughts about hurting yourself or someone else. If you ever feel like you may hurt yourself or others, or have thoughts about taking your own life, get help right away. You can go to your nearest emergency department or call:  Your local emergency services (911 in the [place]  A suicide crisis helpline, such as the [facility] at 215-790-1601. This is open 24 hours a day. Summary  Insomnia is a sleep disorder that makes it difficult to fall asleep or stay asleep.  Insomnia can be long-term (chronic) or short-term (acute).  Treatment for insomnia depends on the cause. Treatment may focus on treating an underlying condition that is causing insomnia.  Keep a sleep diary to help you and your health care provider figure out what could be causing your insomnia. This  information is not intended to replace advice given to you by your health care provider. Make sure you discuss any questions you have with your health care provider. Document Released: 07/11/2000 Document Revised: 06/26/2017 Document Reviewed: 04/23/2017 Elsevier Patient Education  2020 [employer].

## 2019-07-14 NOTE — Progress Notes (Signed)
telephone Note  I connected with [name]  on 07/14/19 at  1:30 PM EST by telephone and verified that I am speaking with the correct person using two identifiers.  Location patient: home Location provider:work or home office Persons participating in the virtual visit: patient, provider  I discussed the limitations of evaluation and management by telemedicine and the availability of in person appointments. The patient expressed understanding and agreed to proceed.   HPI: 1. Increased anxiety/depression/insomnia due to 60 y.o grandson just dx'ed with AML phq 9 score 8 on paxil 10 mg qd  She is more irritable and quiet after this stressor  2. C/o left lower back pain worsening but chronic left side reviewed MRI 11/15/18 with left foraminal narrowing, synovial cyst pain is worse since her right knee surgery nothing tried. Pain worse since back injection      ROS: See pertinent positives and negatives per HPI.  Past Medical History:  Diagnosis Date  . Allergy   . Anemia   . Arthritis   . Asthma   . Bacterial vaginitis   . Blood in stool   . Brachial neuritis or radiculitis NOS   . 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 being followed by neurosurgery/eye doctor and pcp. monitoring size.causes sinus problems  . Bronchitis   . Cardiac arrhythmia   . Cervical neck pain with evidence of disc disease    C5/6 disease, MRI done late 2012 - no records available  . Chronic combined systolic and diastolic CHF (congestive heart failure) ([place])    a. 08/2010 Echo: mildly reduced EF 40-45%, mild diffuse hypokinesis; b. 06/2016 Echo: EF 50%, no rwma, mild to mod TR; c. 03/2017 Echo: EF 40-45%, Gr1 DD (prior echo reviewed and EF felt to be lower than reported).  . Chronic pain   . Cocaine abuse, in remission ([place])    clean x 24 years  . Colon polyps   . COPD (chronic obstructive pulmonary disease) ([place])    a. 12/2018 PFT: No obvious obst/restrictive dzs.  . Coronary artery  disease    a. PCI of LCX 2003; b. PCI of the LAD 2012 with a (2.5 x 8 mm BMS);  c.s/p CABG 4/12: L-LAD, VG-Dx, VG-OM, VG-RCA (Dr. Prescott Gum);  d. 01/2012 MV: inf infarct, attenuation, no ischemia; e. 02/2017 MV: signifi attenuation artifact. Fixed basal antlat/inflat scar vs artifact. Reversible apical lat and mid antlat defect - ? atten vs ischemia. F/u echo w/o wma->Med rx.  . Depression   . Diabetes mellitus without complication ([place])   . Family history of colon cancer   . Generalized headaches    frequent  . GERD (gastroesophageal reflux disease)   . Headache   . Heart murmur   . Hematochezia   . Hepatitis    history of hepatitis b  . History of cervical cancer    s/p cryotherapy  . History of drug abuse ([place])    cocaine, marijuana, clean since 1989  . History of hepatitis B    from eating undercooked liver  . History of MI (myocardial infarction)   . Hyperlipidemia   . Hypertension   . Ischemic cardiomyopathy    a. 03/2017 Echo: EF 40-45%, Gr1 DD.  [name] Myocardial infarction [facility]) 2003, 2012  . PAD (peripheral artery disease) ([place])    a. s/p Right SFA atherectomy and PTA 01/15/11; b. 07/2018 ABI: R 1.02, L 1.09.  [name] Pituitary mass ([place])    a. 12/2018 MRI Brain: Stable pituitary mass  w/ 76mm area of necrosis. Mass abuts R cavernous sinus w/o definite invasion.  . Polyp of colon   . Seasonal allergies   . Sleep apnea    uses cpap  . Smoking history    quit 07/2010  . Thyroid disease   . Urinary incontinence     Past Surgical History:  Procedure Laterality Date  . ABDOMINAL HYSTERECTOMY     2003  . CHOLECYSTECTOMY  1986  . COLONOSCOPY  2008   3 polyps  . COLONOSCOPY WITH PROPOFOL N/A 02/06/2015   Procedure: COLONOSCOPY WITH PROPOFOL;  Surgeon: Lucilla Lame, MD;  Location: [facility] ENDOSCOPY;  Service: Endoscopy;  Laterality: N/A;  . COLONOSCOPY WITH PROPOFOL N/A 01/27/2017   Procedure: COLONOSCOPY WITH PROPOFOL;  Surgeon: Lucilla Lame, MD;  Location[facility] ENDOSCOPY;  Service:  Endoscopy;  Laterality: N/A;  . CORONARY ANGIOPLASTY     w/ stent placement x2  . CORONARY ARTERY BYPASS GRAFT  2012   Dr [name]  . CORONARY STENT PLACEMENT  2003   S/P MI  . CORONARY STENT PLACEMENT  2007   [place]  . ESOPHAGOGASTRODUODENOSCOPY (EGD) WITH PROPOFOL N/A 02/06/2015   Procedure: ESOPHAGOGASTRODUODENOSCOPY (EGD) WITH PROPOFOL;  Surgeon: Lucilla Lame, MD;  Location: [facility] ENDOSCOPY;  Service: Endoscopy;  Laterality: N/A;  . ESOPHAGOGASTRODUODENOSCOPY (EGD) WITH PROPOFOL N/A 01/27/2017   Procedure: ESOPHAGOGASTRODUODENOSCOPY (EGD) WITH PROPOFOL;  Surgeon: Lucilla Lame, MD;  Location: [facility] ENDOSCOPY;  Service: Endoscopy;  Laterality: N/A;  . FEMORAL ARTERY STENT  10/2010   right sided (Dr. Burt Knack)  . KNEE ARTHROSCOPY WITH MEDIAL MENISECTOMY Right 08/20/2017   Procedure: KNEE ARTHROSCOPY WITH MEDIAL  AND LATERAL MENISECTOMY;  Surgeon: Hessie Knows, MD;  Location: [facility] ORS;  Service: Orthopedics;  Laterality: Right;  . POSTERIOR CERVICAL LAMINECTOMY N/A 03/08/2018   Procedure: POSTERIOR CERVICAL LAMINECTOMY-C7;  Surgeon: Deetta Perla, MD;  Location: [facility] ORS;  Service: Neurosurgery;  Laterality: N/A;  . TONSILLECTOMY    . TOTAL KNEE ARTHROPLASTY Right 04/14/2019   Procedure: RIGHT TOTAL KNEE ARTHROPLASTY;  Surgeon: Hessie Knows, MD;  Location: [facility] ORS;  Service: Orthopedics;  Laterality: Right;  . TUBAL LIGATION      Family History  Problem Relation Age of Onset  . Hypertension Father   . Heart failure Father   . Diabetes Father   . Colon cancer Father 56  . Glaucoma Father   . Cancer Father        colorectal   . Heart disease Father   . Other Father        glaucoma  . Breast cancer Mother 76       breast cancer, late 60's  . Cancer Mother        breast  . Brain cancer Sister   . Arthritis Sister   . Diabetes Sister   . Hypertension Sister   . Kidney disease Sister   . Diabetes Brother   . Hypertension Brother   . Other Sister        brain tumor   . Acute  myelogenous leukemia Grandson        06/2019  . Coronary artery disease Neg Hx   . Stroke Neg Hx     SOCIAL HX:  Lives at home with family    Current Outpatient Medications:  .  albuterol (VENTOLIN HFA) 108 (90 Base) MCG/ACT inhaler, Inhale 2 puffs into the lungs every 6 (six) hours as needed for wheezing or shortness of breath., Disp: 1 g, Rfl: 12 .  aspirin EC 81 MG tablet, Take 1  tablet (81 mg total) by mouth daily., Disp: , Rfl:  .  Azelastine HCl 0.15 % SOLN, Place 2 sprays into both nostrils daily as needed (allergies). , Disp: , Rfl:  .  esomeprazole (NEXIUM) 40 MG capsule, Take 1 capsule (40 mg total) by mouth daily. Reported on 09/04/2015, Disp: 90 capsule, Rfl: 3 .  Evolocumab (REPATHA SURECLICK) [id number] MG/ML SOAJ, Inject 1 pen into the skin every 14 (fourteen) days., Disp: 2 pen, Rfl: 10 .  ezetimibe (ZETIA) 10 MG tablet, Take 1 tablet (10 mg total) by mouth daily., Disp: 90 tablet, Rfl: 3 .  fluticasone (FLONASE) 50 MCG/ACT nasal spray, Place 2 sprays into both nostrils daily as needed for allergies. , Disp: , Rfl:  .  fluticasone (FLOVENT HFA) 110 MCG/ACT inhaler, Inhale 1 puff into the lungs 2 (two) times a day. Rinse mouth, Disp: 1 Inhaler, Rfl: 12 .  furosemide (LASIX) 20 MG tablet, TAKE 1 TABLET BY MOUTH ONCE DAILY AND A SECOND TABLET IF NEEDED FOR FLUID BUILD UP., Disp: 180 tablet, Rfl: 0 .  glucose blood (ONE TOUCH ULTRA TEST) test strip, Use as instructed check cbg qd E11.9, Disp: 100 each, Rfl: 12 .  ipratropium-albuterol (DUONEB) 0.5-2.5 (3) MG/3ML SOLN, Take 3 mLs by nebulization 2 (two) times daily as needed. (Patient taking differently: Take 3 mLs by nebulization 2 (two) times daily as needed (wheezing/shortness of breath). ), Disp: 360 mL, Rfl: 12 .  isosorbide mononitrate (IMDUR) 30 MG 24 hr tablet, Take 1 tablet (30 mg total) by mouth 2 (two) times daily., Disp: 180 tablet, Rfl: 1 .  Lactobacillus (ACIDOPHILUS PROBIOTIC) 10 MG CAPS, Take 1 capsule by mouth daily., Disp:  30 capsule, Rfl: 2 .  Lancets MISC, 1 Device by Does not apply route daily. Lancets E11.9, Disp: 90 each, Rfl: 3 .  levocetirizine (XYZAL) 5 MG tablet, Take 1 tablet (5 mg total) by mouth at bedtime as needed for allergies., Disp: 90 tablet, Rfl: 3 .  meclizine (ANTIVERT) 25 MG tablet, Take 25 mg by mouth 3 (three) times daily. , Disp: , Rfl: 3 .  metFORMIN (GLUCOPHAGE) 500 MG tablet, Take 1 tablet (500 mg total) by mouth daily with breakfast., Disp: 90 tablet, Rfl: 3 .  methocarbamol (ROBAXIN) 500 MG tablet, Take 1 tablet (500 mg total) by mouth every 6 (six) hours as needed for muscle spasms., Disp: 30 tablet, Rfl: 0 .  metoprolol tartrate (LOPRESSOR) 25 MG tablet, Take 1 tablet (25 mg total) by mouth 2 (two) times daily., Disp: 180 tablet, Rfl: 3 .  mirabegron ER (MYRBETRIQ) 25 MG TB24 tablet, Take 1 tablet (25 mg total) by mouth daily. (Patient taking differently: Take 25 mg by mouth daily as needed (urinary frequency). ), Disp: 30 tablet, Rfl: 11 .  mupirocin ointment (BACTROBAN) 2 %, Apply 1 application topically 2 (two) times daily. Prn scalp x 1 week and nose x 5 days, Disp: 30 g, Rfl: 0 .  nitroGLYCERIN (NITROSTAT) 0.4 MG SL tablet, Place 1 tablet (0.4 mg total) under the tongue every 5 (five) minutes as needed., Disp: 25 tablet, Rfl: 6 .  PARoxetine (PAXIL) 20 MG tablet, Take 1 tablet (20 mg total) by mouth daily., Disp: 90 tablet, Rfl: 3 .  potassium chloride (KLOR-CON) 10 MEQ tablet, Take 1 tablet by mouth once daily, Disp: 90 tablet, Rfl: 0 .  rivaroxaban (XARELTO) 2.5 MG TABS tablet, Take 1 tablet (2.5 mg total) by mouth 2 (two) times daily., Disp: 60 tablet, Rfl: 5 .  rosuvastatin (CRESTOR) 20  MG tablet, Take 1 tablet (20 mg total) by mouth at bedtime., Disp: 90 tablet, Rfl: 3 .  senna-docusate (SENOKOT-S) 8.6-50 MG tablet, Take 1 tablet by mouth daily as needed for mild constipation or moderate constipation., Disp: 30 tablet, Rfl: 12 .  umeclidinium-vilanterol (ANORO ELLIPTA) 62.5-25  MCG/INH AEPB, Inhale 1 puff into the lungs daily., Disp: 60 each, Rfl: 12 .  Wheat Dextrin (BENEFIBER) POWD, 2 teaspoons added to 4-8 ounces of liquid or food up to 3x per day prn, Disp: 475 g, Rfl: 11 .  hydrOXYzine (ATARAX/VISTARIL) 25 MG tablet, Take 1-2 tablets (25-50 mg total) by mouth daily as needed., Disp: 60 tablet, Rfl: 2 .  losartan (COZAAR) 25 MG tablet, Take 1 tablet (25 mg total) by mouth daily. (Patient taking differently: Take 25 mg by mouth every evening. ), Disp: 90 tablet, Rfl: 3  EXAM: Prior to failed video  VITALS per patient if applicable:  GENERAL: alert, oriented, appears well and in no acute distress  HEENT: atraumatic, conjunttiva clear, no obvious abnormalities on inspection of external nose and ears  NECK: normal movements of the head and neck  LUNGS: on inspection no signs of respiratory distress, breathing rate appears normal, no obvious gross SOB, gasping or wheezing  CV: no obvious cyanosis  MS: moves all visible extremities without noticeable abnormality  PSYCH/NEURO: pleasant and cooperative, no obvious depression or anxiety, speech and thought processing grossly intact  ASSESSMENT AND PLAN:  Discussed the following assessment and plan:  Anxiety and depression - Plan: PARoxetine (PAXIL) 20 MG tablet increase from 10 mg, hydrOXYzine (ATARAX/VISTARIL) 25-50  MG tablet qd prn  Essential hypertension - Plan: Comprehensive metabolic panel, CBC with Differential/Platelet, Lipid panel Cont meds   Type 2 diabetes mellitus without complication, without long-term current use of insulin (HCC) - Plan: Lipid panel, HgB A1c, Microalbumin / creatinine urine ratio Cont meds fasting labs   Anemia, unspecified type - Plan: Iron, TIBC and Ferritin Panel  Chronic left lumbar radiculopathy  Consider PT +/- epidural injections if continues   HM Flu shot utd 05/05/19 utd Consider shingrix in future at pharmacy  pna 23 had 03/10/16  S/p hysterectomy no cervix  and f/u OB/GYNh/o abnormal pap s/p cryo  -pap 05/08/15 neg papwill Repeat In 5 years   Colonoscopy Dr. Allen Norris 01/2017 polyps, gastritis, diverticulosis  Mammogram3/18/2020 negativere ordered  DEXAneg 10/13/2018  -we discussed possible serious and likely etiologies, options for evaluation and workup, limitations of telemedicine visit vs in person visit, treatment, treatment risks and precautions. Pt prefers to treat via telemedicine empirically rather then risking or undertaking an in person visit at this moment. Patient agrees to seek prompt in person care if worsening, new symptoms arise, or if is not improving with treatment.   I discussed the assessment and treatment plan with the patient. The patient was provided an opportunity to ask questions and all were answered. The patient agreed with the plan and demonstrated an understanding of the instructions.   The patient was advised to call back or seek an in-person evaluation if the symptoms worsen or if the condition fails to improve as anticipated.  Time spent 20 minutes Delorise Jackson, MD

## 2019-07-15 ENCOUNTER — Encounter: Payer: Self-pay | Admitting: Internal Medicine

## 2019-07-15 ENCOUNTER — Telehealth: Payer: Self-pay | Admitting: Internal Medicine

## 2019-07-15 NOTE — Telephone Encounter (Signed)
[name]    Which breast is hurting and if your breast is a clock right breast near underarm 9 oclock and left breast near underarm 3 oclock  -where on the clock does your breast hurt and which breast?    Dr. Ree [place] some lumps in left breast and she hit it last night and wanted to check on it. Painful last night it was tender. Today not has tender.

## 2019-07-15 NOTE — Telephone Encounter (Signed)
Pt noticed last month she felt some lumps in left breast and she hit it last night and wanted to check on it. Painful last night it was tender. Today not has tender.  Mammo due 09/2019. Please advise and Thank you!  Call pt @ [phone].

## 2019-07-18 ENCOUNTER — Other Ambulatory Visit: Payer: Self-pay | Admitting: Internal Medicine

## 2019-07-18 DIAGNOSIS — N644 Mastodynia: Secondary | ICD-10-CM

## 2019-07-18 NOTE — Telephone Encounter (Signed)
Ordered stat left breast mammo and [place]   Thanks [place]

## 2019-07-18 NOTE — Telephone Encounter (Signed)
Patient has been notified

## 2019-07-18 NOTE — Telephone Encounter (Signed)
Patient stated it was her left breast and feels like at 1200

## 2019-07-28 DIAGNOSIS — D352 Benign neoplasm of pituitary gland: Secondary | ICD-10-CM | POA: Diagnosis not present

## 2019-08-01 ENCOUNTER — Other Ambulatory Visit: Payer: Medicare HMO

## 2019-08-03 ENCOUNTER — Other Ambulatory Visit: Payer: Self-pay

## 2019-08-03 ENCOUNTER — Ambulatory Visit (INDEPENDENT_AMBULATORY_CARE_PROVIDER_SITE_OTHER): Payer: Medicare HMO

## 2019-08-03 VITALS — Ht 65.0 in | Wt 200.0 lb

## 2019-08-03 DIAGNOSIS — Z0000 Encounter for general adult medical examination without abnormal findings: Secondary | ICD-10-CM

## 2019-08-03 NOTE — Progress Notes (Signed)
Subjective:   [name] is a 59 y.o. female who presents for an Initial Medicare Annual Wellness Visit.  Review of Systems    No ROS.  Medicare Wellness Virtual Visit.  Visual/audio telehealth visit, UTA vital signs.   Wt/Ht provided. See social history for additional risk factors.    Cardiac Risk Factors include: advanced age (>30men, >26 women);hypertension;diabetes mellitus     Objective:    Today's Vitals   08/03/19 1304  Weight: 200 lb (90.7 kg)  Height: 5\' 5"  (1.651 m)   Body mass index is 33.28 kg/m.  Advanced Directives 08/03/2019 04/14/2019 09/05/2018 03/08/2018 03/08/2018 03/02/2018 12/13/2017  Does Patient Have a Medical Advance Directive? Yes No No No No No No  Type of Paramedic of [place];Living will - - - - - -  Does patient want to make changes to medical advance directive? No - Patient declined - - - - - -  Copy of [place] Log in Chart? Yes - validated most recent copy scanned in chart (See row information) - - - - - -  Would patient like information on creating a medical advance directive? - Yes (Inpatient - patient requests chaplain consult to create a medical advance directive) - No - Patient declined No - Patient declined No - Patient declined No - Patient declined    Current Medications (verified) Outpatient Encounter Medications as of 08/03/2019  Medication Sig  . albuterol (VENTOLIN HFA) 108 (90 Base) MCG/ACT inhaler Inhale 2 puffs into the lungs every 6 (six) hours as needed for wheezing or shortness of breath.  [name] aspirin EC 81 MG tablet Take 1 tablet (81 mg total) by mouth daily.  . Azelastine HCl 0.15 % SOLN Place 2 sprays into both nostrils daily as needed (allergies).   [name] esomeprazole (NEXIUM) 40 MG capsule Take 1 capsule (40 mg total) by mouth daily. Reported on 09/04/2015  . Evolocumab (REPATHA SURECLICK) [id number] MG/ML SOAJ Inject 1 pen into the skin every 14 (fourteen) days.  [name] ezetimibe (ZETIA) 10 MG tablet  Take 1 tablet (10 mg total) by mouth daily.  . fluticasone (FLONASE) 50 MCG/ACT nasal spray Place 2 sprays into both nostrils daily as needed for allergies.   . fluticasone (FLOVENT HFA) 110 MCG/ACT inhaler Inhale 1 puff into the lungs 2 (two) times a day. Rinse mouth  . furosemide (LASIX) 20 MG tablet TAKE 1 TABLET BY MOUTH ONCE DAILY AND A SECOND TABLET IF NEEDED FOR FLUID BUILD UP.  [name] glucose blood (ONE TOUCH ULTRA TEST) test strip Use as instructed check cbg qd E11.9  . hydrOXYzine (ATARAX/VISTARIL) 25 MG tablet Take 1-2 tablets (25-50 mg total) by mouth daily as needed.  [name] ipratropium-albuterol (DUONEB) 0.5-2.5 (3) MG/3ML SOLN Take 3 mLs by nebulization 2 (two) times daily as needed. (Patient taking differently: Take 3 mLs by nebulization 2 (two) times daily as needed (wheezing/shortness of breath). )  . isosorbide mononitrate (IMDUR) 30 MG 24 hr tablet Take 1 tablet (30 mg total) by mouth 2 (two) times daily.  . Lactobacillus (ACIDOPHILUS PROBIOTIC) 10 MG CAPS Take 1 capsule by mouth daily.  . Lancets MISC 1 Device by Does not apply route daily. Lancets E11.9  . levocetirizine (XYZAL) 5 MG tablet Take 1 tablet (5 mg total) by mouth at bedtime as needed for allergies.  [name] losartan (COZAAR) 25 MG tablet Take 1 tablet (25 mg total) by mouth daily. (Patient taking differently: Take 25 mg by mouth every evening. )  . meclizine (ANTIVERT)  25 MG tablet Take 25 mg by mouth 3 (three) times daily.   . metFORMIN (GLUCOPHAGE) 500 MG tablet Take 1 tablet (500 mg total) by mouth daily with breakfast.  . methocarbamol (ROBAXIN) 500 MG tablet Take 1 tablet (500 mg total) by mouth every 6 (six) hours as needed for muscle spasms.  . metoprolol tartrate (LOPRESSOR) 25 MG tablet Take 1 tablet (25 mg total) by mouth 2 (two) times daily.  . mirabegron ER (MYRBETRIQ) 25 MG TB24 tablet Take 1 tablet (25 mg total) by mouth daily. (Patient taking differently: Take 25 mg by mouth daily as needed (urinary frequency). )  .  mupirocin ointment (BACTROBAN) 2 % Apply 1 application topically 2 (two) times daily. Prn scalp x 1 week and nose x 5 days  . nitroGLYCERIN (NITROSTAT) 0.4 MG SL tablet Place 1 tablet (0.4 mg total) under the tongue every 5 (five) minutes as needed.  [name] PARoxetine (PAXIL) 20 MG tablet Take 1 tablet (20 mg total) by mouth daily.  . potassium chloride (KLOR-CON) 10 MEQ tablet Take 1 tablet by mouth once daily  . rivaroxaban (XARELTO) 2.5 MG TABS tablet Take 1 tablet (2.5 mg total) by mouth 2 (two) times daily.  . rosuvastatin (CRESTOR) 20 MG tablet Take 1 tablet (20 mg total) by mouth at bedtime.  . senna-docusate (SENOKOT-S) 8.6-50 MG tablet Take 1 tablet by mouth daily as needed for mild constipation or moderate constipation.  [name] umeclidinium-vilanterol (ANORO ELLIPTA) 62.5-25 MCG/INH AEPB Inhale 1 puff into the lungs daily.  . Wheat Dextrin (BENEFIBER) POWD 2 teaspoons added to 4-8 ounces of liquid or food up to 3x per day prn   No facility-administered encounter medications on file as of 08/03/2019.    Allergies (verified) Latex, Shellfish allergy, Sulfonamide derivatives, Watermelon [citrullus vulgaris], Soy allergy, and Other   History: Past Medical History:  Diagnosis Date  . Allergy   . Anemia   . Arthritis   . Asthma   . Bacterial vaginitis   . Blood in stool   . Brachial neuritis or radiculitis NOS   . 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 being followed by neurosurgery/eye doctor and pcp. monitoring size.causes sinus problems  . Bronchitis   . Cardiac arrhythmia   . Cervical neck pain with evidence of disc disease    C5/6 disease, MRI done late 2012 - no records available  . Chronic combined systolic and diastolic CHF (congestive heart failure) ([place])    a. 08/2010 Echo: mildly reduced EF 40-45%, mild diffuse hypokinesis; b. 06/2016 Echo: EF 50%, no rwma, mild to mod TR; c. 03/2017 Echo: EF 40-45%, Gr1 DD (prior echo reviewed and EF felt to be lower than reported).   . Chronic pain   . Cocaine abuse, in remission ([place])    clean x 24 years  . Colon polyps   . COPD (chronic obstructive pulmonary disease) ([place])    a. 12/2018 PFT: No obvious obst/restrictive dzs.  . Coronary artery disease    a. PCI of LCX 2003; b. PCI of the LAD 2012 with a (2.5 x 8 mm BMS);  c.s/p CABG 4/12: L-LAD, VG-Dx, VG-OM, VG-RCA (Dr. Prescott Gum);  d. 01/2012 MV: inf infarct, attenuation, no ischemia; e. 02/2017 MV: signifi attenuation artifact. Fixed basal antlat/inflat scar vs artifact. Reversible apical lat and mid antlat defect - ? atten vs ischemia. F/u echo w/o wma->Med rx.  . Depression   . Diabetes mellitus without complication ([place])   . Family history of colon cancer   .  Generalized headaches    frequent  . GERD (gastroesophageal reflux disease)   . Headache   . Heart murmur   . Hematochezia   . Hepatitis    history of hepatitis b  . History of cervical cancer    s/p cryotherapy  . History of drug abuse ([place])    cocaine, marijuana, clean since 1989  . History of hepatitis B    from eating undercooked liver  . History of MI (myocardial infarction)   . Hyperlipidemia   . Hypertension   . Ischemic cardiomyopathy    a. 03/2017 Echo: EF 40-45%, Gr1 DD.  [name] Myocardial infarction [facility]) 2003, 2012  . PAD (peripheral artery disease) ([place])    a. s/p Right SFA atherectomy and PTA 01/15/11; b. 07/2018 ABI: R 1.02, L 1.09.  [name] Pituitary mass ([place])    a. 12/2018 MRI Brain: Stable pituitary mass w/ 58mm area of necrosis. Mass abuts R cavernous sinus w/o definite invasion.  . Polyp of colon   . Seasonal allergies   . Sleep apnea    uses cpap  . Smoking history    quit 07/2010  . Thyroid disease   . Urinary incontinence    Past Surgical History:  Procedure Laterality Date  . ABDOMINAL HYSTERECTOMY     2003  . CHOLECYSTECTOMY  1986  . COLONOSCOPY  2008   3 polyps  . COLONOSCOPY WITH PROPOFOL N/A 02/06/2015   Procedure: COLONOSCOPY WITH PROPOFOL;  Surgeon: Lucilla Lame, MD;   Location: [facility] ENDOSCOPY;  Service: Endoscopy;  Laterality: N/A;  . COLONOSCOPY WITH PROPOFOL N/A 01/27/2017   Procedure: COLONOSCOPY WITH PROPOFOL;  Surgeon: Lucilla Lame, MD;  Location: [facility] ENDOSCOPY;  Service: Endoscopy;  Laterality: N/A;  . CORONARY ANGIOPLASTY     w/ stent placement x2  . CORONARY ARTERY BYPASS GRAFT  2012   Dr [name]  . CORONARY STENT PLACEMENT  2003   S/P MI  . CORONARY STENT PLACEMENT  2007   [place]  . ESOPHAGOGASTRODUODENOSCOPY (EGD) WITH PROPOFOL N/A 02/06/2015   Procedure: ESOPHAGOGASTRODUODENOSCOPY (EGD) WITH PROPOFOL;  Surgeon: Lucilla Lame, MD;  Location: [facility] ENDOSCOPY;  Service: Endoscopy;  Laterality: N/A;  . ESOPHAGOGASTRODUODENOSCOPY (EGD) WITH PROPOFOL N/A 01/27/2017   Procedure: ESOPHAGOGASTRODUODENOSCOPY (EGD) WITH PROPOFOL;  Surgeon: Lucilla Lame, MD;  Location: [facility] ENDOSCOPY;  Service: Endoscopy;  Laterality: N/A;  . FEMORAL ARTERY STENT  10/2010   right sided (Dr. Burt Knack)  . KNEE ARTHROSCOPY WITH MEDIAL MENISECTOMY Right 08/20/2017   Procedure: KNEE ARTHROSCOPY WITH MEDIAL  AND LATERAL MENISECTOMY;  Surgeon: Hessie Knows, MD;  Location: [facility] ORS;  Service: Orthopedics;  Laterality: Right;  . POSTERIOR CERVICAL LAMINECTOMY N/A 03/08/2018   Procedure: POSTERIOR CERVICAL LAMINECTOMY-C7;  Surgeon: Deetta Perla, MD;  Location: [facility] ORS;  Service: Neurosurgery;  Laterality: N/A;  . TONSILLECTOMY    . TOTAL KNEE ARTHROPLASTY Right 04/14/2019   Procedure: RIGHT TOTAL KNEE ARTHROPLASTY;  Surgeon: Hessie Knows, MD;  Location: [facility] ORS;  Service: Orthopedics;  Laterality: Right;  . TUBAL LIGATION     Family History  Problem Relation Age of Onset  . Hypertension Father   . Heart failure Father   . Diabetes Father   . Colon cancer Father 34  . Glaucoma Father   . Cancer Father        colorectal   . Heart disease Father   . Other Father        glaucoma  . Breast cancer Mother 74       breast cancer, late 64's  .  Cancer Mother        breast  . Brain  cancer Sister   . Arthritis Sister   . Diabetes Sister   . Hypertension Sister   . Kidney disease Sister   . Diabetes Brother   . Hypertension Brother   . Other Sister        brain tumor   . Acute myelogenous leukemia Grandson        06/2019  . Coronary artery disease Neg Hx   . Stroke Neg Hx    Social History   Socioeconomic History  . Marital status: Legally Separated    Spouse name: Not on file  . Number of children: Not on file  . Years of education: Not on file  . Highest education level: Not on file  Occupational History  . Not on file  Tobacco Use  . Smoking status: Former Smoker    Packs/day: 1.00    Years: 38.00    Pack years: 38.00    Types: Cigarettes    Quit date: 08/12/2010    Years since quitting: 8.9  . Smokeless tobacco: Never Used  Substance and Sexual Activity  . Alcohol use: No  . Drug use: No    Types: Cocaine    Comment: Remote Hx (crack cocaine and marijuana)  . Sexual activity: Yes  Other Topics Concern  . 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 Difficulty of Paying Living Expenses: Not hard at all  Food Insecurity: No Food Insecurity  . Worried About Charity fundraiser in the Last Year: Never true  . Ran Out of Food in the Last Year: Never true  Transportation Needs: No Transportation Needs  . Lack of Transportation (Medical): No  . Lack of Transportation (Non-Medical): No  Physical Activity:   . Days of Exercise per Week: Not on file  . Minutes of Exercise per Session: Not on file  Stress: No Stress Concern Present  .  Feeling of Stress : Not at all  Social Connections: Unknown  . Frequency of Communication with Friends and Family: More than three times a week  . Frequency of Social Gatherings with Friends and Family: Once a week  . Attends Religious Services: 1 to 4 times per year  . Active Member of Clubs or Organizations: Yes  . Attends Archivist Meetings: More than 4 times per year  . Marital Status: Not on file    Tobacco Counseling Counseling given: Not Answered   Clinical Intake:  Pre-visit preparation completed: Yes        Diabetes: Yes(Followed by pcp)  How often do you need to have someone help you when you read instructions, pamphlets, or other written materials from your doctor or pharmacy?: 1 - Never  Interpreter Needed?: No      Activities of Daily Living In your present state of health, do you have any difficulty performing the following activities: 08/03/2019 04/14/2019  Hearing? N N  Vision? N  N  Difficulty concentrating or making decisions? N N  Walking or climbing stairs? N Y  Dressing or bathing? N Y  Comment - -  Doing errands, shopping? N Y  Conservation officer, nature and eating ? N -  Using the Toilet? N -  In the past six months, have you accidently leaked urine? N -  Do you have problems with loss of bowel control? N -  Managing your Medications? N -  Managing your Finances? N -  Housekeeping or managing your Housekeeping? Y -  Comment Family assist -  Some recent data might be hidden     Immunizations and Health Maintenance Immunization History  Administered Date(s) Administered  . Influenza Split 05/22/2015, 04/21/2016  . Influenza,inj,Quad PF,6+ Mos 04/05/2018, 03/29/2019  . Tdap 05/05/2019   Health Maintenance Due  Topic Date Due  . FOOT EXAM  08[birth year]14/1968  . URINE MICROALBUMIN  04/06/2019    Patient Care Team: [name], [name], MD as PCP - General (Internal Medicine) Minna Merritts, MD as PCP - Cardiology (Cardiology)  Indicate  any recent Medical Services you may have received from other than [facility] providers in the past year (date may be approximate).     Assessment:   This is a routine wellness examination for [place].  Nurse connected with patient 08/03/19 at  1:00 PM EST by a telephone enabled telemedicine application and verified that I am speaking with the correct person using two identifiers. Patient stated full name and DOB. Patient gave permission to continue with virtual visit. Patient's location was at home and Nurse's location was at [place] office.   Patient is alert and oriented x3. Patient denies difficulty focusing or concentrating. Patient likes to read and watches mystery television for brain stimulation.   Health Maintenance Due: -Mammogram- scheduled 08/11/19 -Foot Exam- followed by pcp. -Hgb A1c- 02/07/19 (6.5) See completed HM at the end of note.   Eye: Visual acuity not assessed. Virtual visit. Followed by their ophthalmologist. Retinopathy- none reported. Glaucoma suspect.   Dental: Plans to schedule.   Hearing: Demonstrates normal hearing during visit.  Safety:  Patient feels safe at home- yes Patient does have smoke detectors at home- yes Patient does wear sunscreen or protective clothing when in direct sunlight - yes Patient does wear seat belt when in a moving vehicle - yes Patient drives- yes Adequate lighting in walkways free from debris- yes Grab bars and handrails used as appropriate- yes Ambulates with an assistive device- no Cell phone on person when ambulating outside of the home- yes  Social: Alcohol intake - no       Smoking history- former   Smokers in home? none Illicit drug use? none  Medication: Taking as directed and without issues.  Pill box in use -yes  Self managed - yes   Covid-19: Precautions and sickness symptoms discussed. Wears mask, social distancing, hand hygiene as appropriate.   Activities of Daily Living Patient denies needing  assistance with: household chores, feeding themselves, getting from bed to chair, getting to the toilet, bathing/showering, dressing, managing money, or preparing meals.  Assisted by family with housekeeping as needed.   Discussed the importance of a healthy diet, water intake and the benefits of aerobic exercise.   Physical activity- active around the home.  Diet:  Lean meats Water: good intake.  Other Providers Patient Care Team: [name], [name], MD as PCP - General (Internal Medicine) Minna Merritts, MD as PCP - Cardiology (Cardiology)  Hearing/Vision screen  Hearing Screening  125Hz  250Hz  500Hz  1000Hz  2000Hz  3000Hz  4000Hz  6000Hz  8000Hz   Right ear:           Left ear:           Comments: Patient is able to hear conversational tones without difficulty.  No issues reported.   Vision Screening Comments: Wears corrective lenses Visual acuity not assessed, virtual visit.  They have seen their ophthalmologist.     Dietary issues and exercise activities discussed:    Goals      Patient Stated   . Follow up with Primary Care Provider (pt-stated)     As needed      Depression Screen PHQ 2/9 Scores 08/03/2019 07/14/2019 03/29/2019 03/10/2019 08/26/2018 04/05/2018 12/18/2017  PHQ - 2 Score 1 2 0 0 0 0 0  PHQ- 9 Score 2 8 - - - - -    Fall Risk Fall Risk  08/03/2019 03/29/2019 03/10/2019 08/26/2018 04/05/2018  Falls in the past year? 0 0 0 1 No  Number falls in past yr: - - 0 0 -  Injury with Fall? - - - 0 -  Follow up Falls prevention discussed - - - -   Timed Get Up and Go Performed no, virtual visit  Cognitive Function:     6CIT Screen 08/03/2019  What Year? 0 points  What month? 0 points  What time? 0 points  Count back from 20 0 points  Months in reverse 0 points  Repeat phrase 0 points  Total Score 0    Screening Tests Health Maintenance  Topic Date Due  . FOOT EXAM  08/14/1968  . URINE MICROALBUMIN  04/06/2019  . HEMOGLOBIN A1C  08/10/2019  .  OPHTHALMOLOGY EXAM  01/27/2020  . MAMMOGRAM  10/12/2020  . PAP SMEAR-Modifier  05/12/2021  . COLONOSCOPY  01/27/2022  . TETANUS/TDAP  05/04/2029  . INFLUENZA VACCINE  Completed  . PNEUMOCOCCAL POLYSACCHARIDE VACCINE AGE 30-64 HIGH RISK  Completed  . Hepatitis C Screening  Completed  . HIV Screening  Completed      Plan:   Keep all routine maintenance appointments.   Follow up 08/12/19 @ 1:00  Next scheduled lab 08/24/19 @ 8:45  Follow up 10/13/19 @ 1:30  Medicare Attestation I have personally reviewed: The patient's medical and social history Their use of alcohol, tobacco or illicit drugs Their current medications and supplements The patient's functional ability including ADLs,fall risks, home safety risks, cognitive, and hearing and visual impairment Diet and physical activities Evidence for depression   I have reviewed and discussed with patient certain preventive protocols, quality metrics, and best practice recommendations.     Varney Biles, LPN   [id number]

## 2019-08-03 NOTE — Patient Instructions (Addendum)
  Ms. Brackley , Thank you for taking time to come for your Medicare Wellness Visit. I appreciate your ongoing commitment to your health goals. Please review the following plan we discussed and let me know if I can assist you in the future.   These are the goals we discussed: Goals      Patient Stated   . Follow up with Primary Care Provider (pt-stated)     As needed       This is a list of the screening recommended for you and due dates:  Health Maintenance  Topic Date Due  . Complete foot exam   08/14/1968  . Urine Protein Check  04/06/2019  . Hemoglobin A1C  08/10/2019  . Eye exam for diabetics  01/27/2020  . Mammogram  10/12/2020  . Pap Smear  05/12/2021  . Colon Cancer Screening  01/27/2022  . Tetanus Vaccine  05/04/2029  . Flu Shot  Completed  . Pneumococcal vaccine  Completed  .  Hepatitis C: One time screening is recommended by Center for Disease Control  (CDC) for  adults born from [birth year] through 1967.   Completed  . HIV Screening  Completed

## 2019-08-11 ENCOUNTER — Other Ambulatory Visit: Payer: Self-pay | Admitting: Internal Medicine

## 2019-08-11 ENCOUNTER — Other Ambulatory Visit: Payer: Medicare HMO

## 2019-08-11 ENCOUNTER — Telehealth: Payer: Self-pay | Admitting: Internal Medicine

## 2019-08-11 DIAGNOSIS — Z20822 Contact with and (suspected) exposure to covid-19: Secondary | ICD-10-CM

## 2019-08-11 NOTE — Telephone Encounter (Signed)
Pt needs an order sent to [facility] to have a Covid test done. Her Daughter tested positive

## 2019-08-11 NOTE — Telephone Encounter (Signed)
She can go no order needed  We can do order through [facility] does she want this?  She has to schedule online or given her the #    [place]

## 2019-08-11 NOTE — Telephone Encounter (Signed)
Patient has been informed. Number given to patient. She will contact [place].

## 2019-08-12 ENCOUNTER — Encounter: Payer: Self-pay | Admitting: Internal Medicine

## 2019-08-12 ENCOUNTER — Other Ambulatory Visit: Payer: Self-pay

## 2019-08-12 ENCOUNTER — Ambulatory Visit (INDEPENDENT_AMBULATORY_CARE_PROVIDER_SITE_OTHER): Payer: Medicare HMO | Admitting: Internal Medicine

## 2019-08-12 ENCOUNTER — Ambulatory Visit: Payer: Medicare HMO | Attending: Internal Medicine

## 2019-08-12 VITALS — BP 117/75 | HR 95 | Temp 95.9°F | Ht 65.0 in | Wt 200.0 lb

## 2019-08-12 DIAGNOSIS — R112 Nausea with vomiting, unspecified: Secondary | ICD-10-CM | POA: Diagnosis not present

## 2019-08-12 DIAGNOSIS — F419 Anxiety disorder, unspecified: Secondary | ICD-10-CM

## 2019-08-12 DIAGNOSIS — Z20822 Contact with and (suspected) exposure to covid-19: Secondary | ICD-10-CM | POA: Diagnosis not present

## 2019-08-12 DIAGNOSIS — K5900 Constipation, unspecified: Secondary | ICD-10-CM

## 2019-08-12 DIAGNOSIS — K582 Mixed irritable bowel syndrome: Secondary | ICD-10-CM

## 2019-08-12 DIAGNOSIS — R69 Illness, unspecified: Secondary | ICD-10-CM | POA: Diagnosis not present

## 2019-08-12 DIAGNOSIS — F32A Depression, unspecified: Secondary | ICD-10-CM

## 2019-08-12 DIAGNOSIS — F329 Major depressive disorder, single episode, unspecified: Secondary | ICD-10-CM

## 2019-08-12 MED ORDER — ONDANSETRON HCL 4 MG PO TABS: 4.0000 mg | ORAL_TABLET | Freq: Three times a day (TID) | ORAL | 0 refills | Status: DC | PRN

## 2019-08-12 MED ORDER — DICYCLOMINE HCL 10 MG PO CAPS: 10.0000 mg | ORAL_CAPSULE | Freq: Three times a day (TID) | ORAL | 0 refills | Status: DC

## 2019-08-12 MED ORDER — POLYETHYLENE GLYCOL 3350 17 G PO PACK: 17.0000 g | PACK | Freq: Every day | ORAL | 0 refills | Status: DC

## 2019-08-12 NOTE — Patient Instructions (Signed)
Im wondering if you could have IBS   Diet for Irritable Bowel Syndrome/IBS  When you have irritable bowel syndrome (IBS), it is very important to eat the foods and follow the eating habits that are best for your condition. IBS may cause various symptoms such as pain in the abdomen, constipation, or diarrhea. Choosing the right foods can help to ease the discomfort from these symptoms. Work with your health care provider and diet and nutrition specialist (dietitian) to find the eating plan that will help to control your symptoms. What are tips for following this plan?      Keep a food diary. This will help you identify foods that cause symptoms. Write down: ? What you eat and when you eat it. ? What symptoms you have. ? When symptoms occur in relation to your meals, such as "pain in abdomen 2 hours after dinner."  Eat your meals slowly and in a relaxed setting.  Aim to eat 5-6 small meals per day. Do not skip meals.  Drink enough fluid to keep your urine pale yellow.  Ask your health care provider if you should take an over-the-counter probiotic to help restore healthy bacteria in your gut (digestive tract). ? Probiotics are foods that contain good bacteria and yeasts.  Your dietitian may have specific dietary recommendations for you based on your symptoms. He or she may recommend that you: ? Avoid foods that cause symptoms. Talk with your dietitian about other ways to get the same nutrients that are in those problem foods. ? Avoid foods with gluten. Gluten is a protein that is found in rye, wheat, and barley. ? Eat more foods that contain soluble fiber. Examples of foods with high soluble fiber include oats, seeds, and certain fruits and vegetables. Take a fiber supplement if directed by your dietitian. ? Reduce or avoid certain foods called FODMAPs. These are foods that contain carbohydrates that are hard to digest. Ask your doctor which foods contain these carbohydrates. What foods are  not recommended? The following are some foods and drinks that may make your symptoms worse:  Fatty foods, such as french fries.  Foods that contain gluten, such as pasta and cereal.  Dairy products, such as milk, cheese, and ice cream.  Chocolate.  Alcohol.  Products with caffeine, such as coffee.  Carbonated drinks, such as soda.  Foods that are high in FODMAPs. These include certain fruits and vegetables.  Products with sweeteners such as honey, high fructose corn syrup, sorbitol, and mannitol. The items listed above may not be a complete list of foods and beverages you should avoid. Contact a dietitian for more information. What foods are good sources of fiber? Your health care provider or dietitian may recommend that you eat more foods that contain fiber. Fiber can help to reduce constipation and other IBS symptoms. Add foods with fiber to your diet a little at a time so your body can get used to them. Too much fiber at one time might cause gas and swelling of your abdomen. The following are some foods that are good sources of fiber:  Berries, such as raspberries, strawberries, and blueberries.  Tomatoes.  Carrots.  Brown rice.  Oats.  Seeds, such as chia and pumpkin seeds. The items listed above may not be a complete list of recommended sources of fiber. Contact your dietitian for more options. Where to find more information  International Foundation for Functional Gastrointestinal Disorders: www.iffgd.[employer] of Diabetes and Digestive and Kidney Diseases: [employer] Summary  When you have irritable bowel syndrome (IBS), it is very important to eat the foods and follow the eating habits that are best for your condition.  IBS may cause various symptoms such as pain in the abdomen, constipation, or diarrhea.  Choosing the right foods can help to ease the discomfort that comes from symptoms.  Keep a food diary. This will help you identify  foods that cause symptoms.  Your health care provider or diet and nutrition specialist (dietitian) may recommend that you eat more foods that contain fiber. This information is not intended to replace advice given to you by your health care provider. Make sure you discuss any questions you have with your health care provider. Document Revised: 11/03/2018 Document Reviewed: 03/17/2017 Elsevier Patient Education  [place]  FODMAPs (fermentable oligosaccharides, disaccharides, monosaccharides, and polyols) are sugars that are hard for some people to digest. A low-FODMAP eating plan may help some people who have bowel (intestinal) diseases to manage their symptoms. This meal plan can be complicated to follow. Work with a diet and nutrition specialist (dietitian) to make a low-FODMAP eating plan that is right for you. A dietitian can make sure that you get enough nutrition from this diet. What are tips for following this plan? Reading food labels  Check labels for hidden FODMAPs such as: ? High-fructose syrup. ? Honey. ? Agave. ? Natural fruit flavors. ? Onion or garlic powder.  Choose low-FODMAP foods that contain 3-4 grams of fiber per serving.  Check food labels for serving sizes. Eat only one serving at a time to make sure FODMAP levels stay low. Meal planning  Follow a low-FODMAP eating plan for up to 6 weeks, or as told by your health care provider or dietitian.  To follow the eating plan: 1. Eliminate high-FODMAP foods from your diet completely. 2. Gradually reintroduce high-FODMAP foods into your diet one at a time. Most people should wait a few days after introducing one high-FODMAP food before they introduce the next high-FODMAP food. Your dietitian can recommend how quickly you may reintroduce foods. 3. Keep a daily record of what you eat and drink, and make note of any symptoms that you have after eating. 4. Review your daily record with a  dietitian regularly. Your dietitian can help you identify which foods you can eat and which foods you should avoid. General tips  Drink enough fluid each day to keep your urine pale yellow.  Avoid processed foods. These often have added sugar and may be high in FODMAPs.  Avoid most dairy products, whole grains, and sweeteners.  Work with a dietitian to make sure you get enough fiber in your diet. Recommended foods Grains  Gluten-free grains, such as rice, oats, buckwheat, quinoa, corn, polenta, and millet. Gluten-free pasta, bread, or cereal. Rice noodles. Corn tortillas. Vegetables  Eggplant, zucchini, cucumber, peppers, green beans, Brussels sprouts, bean sprouts, lettuce, arugula, kale, Swiss chard, spinach, collard greens, bok choy, summer squash, potato, and tomato. Limited amounts of corn, carrot, and sweet potato. Green parts of scallions. Fruits  Bananas, oranges, lemons, limes, blueberries, raspberries, strawberries, grapes, cantaloupe, honeydew melon, kiwi, papaya, passion fruit, and pineapple. Limited amounts of dried cranberries, banana chips, and shredded coconut. Dairy  Lactose-free milk, yogurt, and kefir. Lactose-free cottage cheese and ice cream. Non-dairy milks, such as almond, coconut, hemp, and rice milk. Yogurts made of non-dairy milks. Limited amounts of goat cheese, brie, mozzarella, parmesan, swiss, and other hard cheeses. Meats and other protein foods  Unseasoned beef, pork, poultry, or fish. Eggs. Berniece Salines. Tofu (firm) and tempeh. Limited amounts of nuts and seeds, such as almonds, walnuts, [place] nuts, pecans, peanuts, pumpkin seeds, chia seeds, and sunflower seeds. Fats and oils  Butter-free spreads. Vegetable oils, such as olive, canola, and sunflower oil. Seasoning and other foods  Artificial sweeteners with names that do not end in "ol" such as aspartame, saccharine, and stevia. Maple syrup, white table sugar, raw sugar, brown sugar, and molasses. Fresh  basil, coriander, parsley, rosemary, and thyme. Beverages  Water and mineral water. Sugar-sweetened soft drinks. Small amounts of orange juice or cranberry juice. Black and green tea. Most dry wines. Coffee. This may not be a complete list of low-FODMAP foods. Talk with your dietitian for more information. Foods to avoid Grains  Wheat, including kamut, durum, and semolina. Barley and bulgur. Couscous. Wheat-based cereals. Wheat noodles, bread, crackers, and pastries. Vegetables  Chicory root, artichoke, asparagus, cabbage, snow peas, sugar snap peas, mushrooms, and cauliflower. Onions, garlic, leeks, and the white part of scallions. Fruits  Fresh, dried, and juiced forms of apple, pear, watermelon, peach, plum, cherries, apricots, blackberries, boysenberries, figs, nectarines, and mango. Avocado. Dairy  Milk, yogurt, ice cream, and soft cheese. Cream and sour cream. Milk-based sauces. Custard. Meats and other protein foods  Fried or fatty meat. Sausage. Cashews and pistachios. Soybeans, baked beans, black beans, chickpeas, kidney beans, fava beans, navy beans, lentils, and split peas. Seasoning and other foods  Any sugar-free gum or candy. Foods that contain artificial sweeteners such as sorbitol, mannitol, isomalt, or xylitol. Foods that contain honey, high-fructose corn syrup, or agave. Bouillon, vegetable stock, beef stock, and chicken stock. Garlic and onion powder. Condiments made with onion, such as hummus, chutney, pickles, relish, salad dressing, and salsa. Tomato paste. Beverages  Chicory-based drinks. Coffee substitutes. Chamomile tea. Fennel tea. Sweet or fortified wines such as port or sherry. Diet soft drinks made with isomalt, mannitol, maltitol, sorbitol, or xylitol. Apple, pear, and mango juice. Juices with high-fructose corn syrup. This may not be a complete list of high-FODMAP foods. Talk with your dietitian to discuss what dietary choices are best for you.  Summary  A  low-FODMAP eating plan is a short-term diet that eliminates FODMAPs from your diet to help ease symptoms of certain bowel diseases.  The eating plan usually lasts up to 6 weeks. After that, high-FODMAP foods are restarted gradually, one at a time, so you can find out which may be causing symptoms.  A low-FODMAP eating plan can be complicated. It is best to work with a dietitian who has experience with this type of plan. This information is not intended to replace advice given to you by your health care provider. Make sure you discuss any questions you have with your health care provider. Document Revised: 06/26/2017 Document Reviewed: 03/10/2017 Elsevier Patient Education  [place].

## 2019-08-12 NOTE — Progress Notes (Signed)
Virtual Visit via Video Note  I connected with [name]  on 08/12/19 at  1:08 PM EST by a video enabled telemedicine application and verified that I am speaking with the correct person using two identifiers.  Location patient: home Location provider:work or home office Persons participating in the virtual visit: patient, provider  I discussed the limitations of evaluation and management by telemedicine and the availability of in person appointments. The patient expressed understanding and agreed to proceed.   HPI: 1. covid 45 exposure grandson and daughter keep testing + or - unsure if they have it and she has been around them they tested + 08/09/19 w/o sx's and she had testing today 08/12/19  She doesnot have sx's 2. Anxiety improved paxil 20 mg qd family has notices  3. H/o constipation, loose stool yellow/brown/pellets and constipation and abdominal pain.1 week ago she had llq ab bloating and n/v and pain radiated to left lower mid back and was mild to moderate nothing tried in the past tried milk of mag for diarrhea    ROS: See pertinent positives and negatives per HPI.  Past Medical History:  Diagnosis Date  . Allergy   . Anemia   . Arthritis   . Asthma   . Bacterial vaginitis   . Blood in stool   . Brachial neuritis or radiculitis NOS   . 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 being followed by neurosurgery/eye doctor and pcp. monitoring size.causes sinus problems  . Bronchitis   . Cardiac arrhythmia   . Cervical neck pain with evidence of disc disease    C5/6 disease, MRI done late 2012 - no records available  . Chronic combined systolic and diastolic CHF (congestive heart failure) ([place])    a. 08/2010 Echo: mildly reduced EF 40-45%, mild diffuse hypokinesis; b. 06/2016 Echo: EF 50%, no rwma, mild to mod TR; c. 03/2017 Echo: EF 40-45%, Gr1 DD (prior echo reviewed and EF felt to be lower than reported).  . Chronic pain   . 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 Colon polyps   . COPD (chronic obstructive pulmonary disease) ([address])    a. 12/2018 PFT: No obvious obst/restrictive dzs.  . Coronary artery disease    a. PCI of LCX 2003; b. PCI of the LAD 2012 with a (2.5 x 8 mm BMS);  c.s/p CABG 4/12: L-LAD, VG-Dx, VG-OM, VG-RCA (Dr. Prescott Gum);  d. 01/2012 MV: inf infarct, attenuation, no ischemia; e. 02/2017 MV: signifi attenuation artifact. Fixed basal antlat/inflat scar vs artifact. Reversible apical lat and mid antlat defect - ? atten vs ischemia. F/u echo w/o wma->Med rx.  . Depression   . Diabetes mellitus without complication ([place])   . Family history of colon cancer   . Generalized headaches    frequent  . GERD (gastroesophageal reflux disease)   . Headache   . Heart murmur   . Hematochezia   . Hepatitis    history of hepatitis b  . History of cervical cancer    s/p cryotherapy  . History of drug abuse ([place])    cocaine, marijuana, clean since 1989  . History of hepatitis B    from eating undercooked liver  . History of MI (myocardial infarction)   . Hyperlipidemia   . Hypertension   . Ischemic cardiomyopathy    a. 03/2017 Echo: EF 40-45%, Gr1 DD.  [name] Myocardial infarction [facility]) 2003, 2012  . PAD (peripheral artery disease) ([place])  a. s/p Right SFA atherectomy and PTA 01/15/11; b. 07/2018 ABI: R 1.02, L 1.09.  [name] Pituitary mass ([place])    a. 12/2018 MRI Brain: Stable pituitary mass w/ 71mm area of necrosis. Mass abuts R cavernous sinus w/o definite invasion.  . Polyp of colon   . Seasonal allergies   . Sleep apnea    uses cpap  . Smoking history    quit 07/2010  . Thyroid disease   . Urinary incontinence     Past Surgical History:  Procedure Laterality Date  . ABDOMINAL HYSTERECTOMY     2003  . CHOLECYSTECTOMY  1986  . COLONOSCOPY  2008   3 polyps  . COLONOSCOPY WITH PROPOFOL N/A 02/06/2015   Procedure: COLONOSCOPY WITH PROPOFOL;  Surgeon: Lucilla Lame, MD;  Location: [facility] ENDOSCOPY;  Service: Endoscopy;   Laterality: N/A;  . COLONOSCOPY WITH PROPOFOL N/A 01/27/2017   Procedure: COLONOSCOPY WITH PROPOFOL;  Surgeon: Lucilla Lame, MD;  Location: [facility] ENDOSCOPY;  Service: Endoscopy;  Laterality: N/A;  . CORONARY ANGIOPLASTY     w/ stent placement x2  . CORONARY ARTERY BYPASS GRAFT  2012   Dr [name]  . CORONARY STENT PLACEMENT  2003   S/P MI  . CORONARY STENT PLACEMENT  2007   [place]  . ESOPHAGOGASTRODUODENOSCOPY (EGD) WITH PROPOFOL N/A 02/06/2015   Procedure: ESOPHAGOGASTRODUODENOSCOPY (EGD) WITH PROPOFOL;  Surgeon: Lucilla Lame, MD;  Location: [facility] ENDOSCOPY;  Service: Endoscopy;  Laterality: N/A;  . ESOPHAGOGASTRODUODENOSCOPY (EGD) WITH PROPOFOL N/A 01/27/2017   Procedure: ESOPHAGOGASTRODUODENOSCOPY (EGD) WITH PROPOFOL;  Surgeon: Lucilla Lame, MD;  Location: [facility] ENDOSCOPY;  Service: Endoscopy;  Laterality: N/A;  . FEMORAL ARTERY STENT  10/2010   right sided (Dr. Burt Knack)  . KNEE ARTHROSCOPY WITH MEDIAL MENISECTOMY Right 08/20/2017   Procedure: KNEE ARTHROSCOPY WITH MEDIAL  AND LATERAL MENISECTOMY;  Surgeon: Hessie Knows, MD;  Location: [facility] ORS;  Service: Orthopedics;  Laterality: Right;  . POSTERIOR CERVICAL LAMINECTOMY N/A 03/08/2018   Procedure: POSTERIOR CERVICAL LAMINECTOMY-C7;  Surgeon: Deetta Perla, MD;  Location: [facility] ORS;  Service: Neurosurgery;  Laterality: N/A;  . TONSILLECTOMY    . TOTAL KNEE ARTHROPLASTY Right 04/14/2019   Procedure: RIGHT TOTAL KNEE ARTHROPLASTY;  Surgeon: Hessie Knows, MD;  Location: [facility] ORS;  Service: Orthopedics;  Laterality: Right;  . TUBAL LIGATION      Family History  Problem Relation Age of Onset  . Hypertension Father   . Heart failure Father   . Diabetes Father   . Colon cancer Father 49  . Glaucoma Father   . Cancer Father        colorectal   . Heart disease Father   . Other Father        glaucoma  . Breast cancer Mother 73       breast cancer, late 28's  . Cancer Mother        breast  . Brain cancer Sister   . Arthritis Sister   . Diabetes  Sister   . Hypertension Sister   . Kidney disease Sister   . Diabetes Brother   . Hypertension Brother   . Other Sister        brain tumor   . Acute myelogenous leukemia Grandson        06/2019  . Coronary artery disease Neg Hx   . Stroke Neg Hx     SOCIAL HX: lives at home with family    Current Outpatient Medications:  .  albuterol (VENTOLIN HFA) 108 (90 Base) MCG/ACT inhaler, Inhale 2 puffs  into the lungs every 6 (six) hours as needed for wheezing or shortness of breath., Disp: 1 g, Rfl: 12 .  aspirin EC 81 MG tablet, Take 1 tablet (81 mg total) by mouth daily., Disp: , Rfl:  .  Azelastine HCl 0.15 % SOLN, Place 2 sprays into both nostrils daily as needed (allergies). , Disp: , Rfl:  .  esomeprazole (NEXIUM) 40 MG capsule, Take 1 capsule (40 mg total) by mouth daily. Reported on 09/04/2015, Disp: 90 capsule, Rfl: 3 .  Evolocumab (REPATHA SURECLICK) [id number] MG/ML SOAJ, Inject 1 pen into the skin every 14 (fourteen) days., Disp: 2 pen, Rfl: 10 .  ezetimibe (ZETIA) 10 MG tablet, Take 1 tablet (10 mg total) by mouth daily., Disp: 90 tablet, Rfl: 3 .  fluticasone (FLONASE) 50 MCG/ACT nasal spray, Place 2 sprays into both nostrils daily as needed for allergies. , Disp: , Rfl:  .  fluticasone (FLOVENT HFA) 110 MCG/ACT inhaler, Inhale 1 puff into the lungs 2 (two) times a day. Rinse mouth, Disp: 1 Inhaler, Rfl: 12 .  furosemide (LASIX) 20 MG tablet, TAKE 1 TABLET BY MOUTH ONCE DAILY AND A SECOND TABLET IF NEEDED FOR FLUID BUILD UP., Disp: 180 tablet, Rfl: 0 .  glucose blood (ONE TOUCH ULTRA TEST) test strip, Use as instructed check cbg qd E11.9, Disp: 100 each, Rfl: 12 .  hydrOXYzine (ATARAX/VISTARIL) 25 MG tablet, Take 1-2 tablets (25-50 mg total) by mouth daily as needed., Disp: 60 tablet, Rfl: 2 .  ipratropium-albuterol (DUONEB) 0.5-2.5 (3) MG/3ML SOLN, Take 3 mLs by nebulization 2 (two) times daily as needed. (Patient taking differently: Take 3 mLs by nebulization 2 (two) times daily as needed  (wheezing/shortness of breath). ), Disp: 360 mL, Rfl: 12 .  isosorbide mononitrate (IMDUR) 30 MG 24 hr tablet, Take 1 tablet (30 mg total) by mouth 2 (two) times daily., Disp: 180 tablet, Rfl: 1 .  Lactobacillus (ACIDOPHILUS PROBIOTIC) 10 MG CAPS, Take 1 capsule by mouth daily., Disp: 30 capsule, Rfl: 2 .  Lancets MISC, 1 Device by Does not apply route daily. Lancets E11.9, Disp: 90 each, Rfl: 3 .  levocetirizine (XYZAL) 5 MG tablet, Take 1 tablet (5 mg total) by mouth at bedtime as needed for allergies., Disp: 90 tablet, Rfl: 3 .  meclizine (ANTIVERT) 25 MG tablet, Take 25 mg by mouth 3 (three) times daily. , Disp: , Rfl: 3 .  metFORMIN (GLUCOPHAGE) 500 MG tablet, Take 1 tablet (500 mg total) by mouth daily with breakfast., Disp: 90 tablet, Rfl: 3 .  methocarbamol (ROBAXIN) 500 MG tablet, Take 1 tablet (500 mg total) by mouth every 6 (six) hours as needed for muscle spasms., Disp: 30 tablet, Rfl: 0 .  metoprolol tartrate (LOPRESSOR) 25 MG tablet, Take 1 tablet (25 mg total) by mouth 2 (two) times daily., Disp: 180 tablet, Rfl: 3 .  mirabegron ER (MYRBETRIQ) 25 MG TB24 tablet, Take 1 tablet (25 mg total) by mouth daily. (Patient taking differently: Take 25 mg by mouth daily as needed (urinary frequency). ), Disp: 30 tablet, Rfl: 11 .  mupirocin ointment (BACTROBAN) 2 %, Apply 1 application topically 2 (two) times daily. Prn scalp x 1 week and nose x 5 days, Disp: 30 g, Rfl: 0 .  nitroGLYCERIN (NITROSTAT) 0.4 MG SL tablet, Place 1 tablet (0.4 mg total) under the tongue every 5 (five) minutes as needed., Disp: 25 tablet, Rfl: 6 .  PARoxetine (PAXIL) 20 MG tablet, Take 1 tablet (20 mg total) by mouth daily., Disp: 90 tablet,  Rfl: 3 .  potassium chloride (KLOR-CON) 10 MEQ tablet, Take 1 tablet by mouth once daily, Disp: 90 tablet, Rfl: 0 .  rivaroxaban (XARELTO) 2.5 MG TABS tablet, Take 1 tablet (2.5 mg total) by mouth 2 (two) times daily., Disp: 60 tablet, Rfl: 5 .  rosuvastatin (CRESTOR) 20 MG tablet,  Take 1 tablet (20 mg total) by mouth at bedtime., Disp: 90 tablet, Rfl: 3 .  senna-docusate (SENOKOT-S) 8.6-50 MG tablet, Take 1 tablet by mouth daily as needed for mild constipation or moderate constipation., Disp: 30 tablet, Rfl: 12 .  umeclidinium-vilanterol (ANORO ELLIPTA) 62.5-25 MCG/INH AEPB, Inhale 1 puff into the lungs daily., Disp: 60 each, Rfl: 12 .  Wheat Dextrin (BENEFIBER) POWD, 2 teaspoons added to 4-8 ounces of liquid or food up to 3x per day prn, Disp: 475 g, Rfl: 11 .  dicyclomine (BENTYL) 10 MG capsule, Take 1 capsule (10 mg total) by mouth 3 (three) times daily before meals. As needed for abdominal pain, Disp: 120 capsule, Rfl: 0 .  losartan (COZAAR) 25 MG tablet, Take 1 tablet (25 mg total) by mouth daily. (Patient taking differently: Take 25 mg by mouth every evening. ), Disp: 90 tablet, Rfl: 3 .  ondansetron (ZOFRAN) 4 MG tablet, Take 1 tablet (4 mg total) by mouth every 8 (eight) hours as needed for nausea or vomiting., Disp: 20 tablet, Rfl: 0 .  polyethylene glycol (MIRALAX) 17 g packet, Take 17 g by mouth daily., Disp: 14 each, Rfl: 0  EXAM:  VITALS per patient if applicable:  GENERAL: alert, oriented, appears well and in no acute distress  HEENT: atraumatic, conjunttiva clear, no obvious abnormalities on inspection of external nose and ears  NECK: normal movements of the head and neck  LUNGS: on inspection no signs of respiratory distress, breathing rate appears normal, no obvious gross SOB, gasping or wheezing  CV: no obvious cyanosis  MS: moves all visible extremities without noticeable abnormality  PSYCH/NEURO: pleasant and cooperative, no obvious depression or anxiety, speech and thought processing grossly intact  ASSESSMENT AND PLAN:  Discussed the following assessment and plan:  Exposure to COVID-19 virus Testing 08/12/19 pending  rec vit C 1000, vit D3 4000, zinc 100 mg qd, quercetin 250-500 mg bid   Constipation and loose stools with ab pain,  bloating and nausea ? Mixed ibs h/o constipation and diverticulosis sx's worse with food - Plan: polyethylene glycol (MIRALAX) 17 g packet, prn dicyclomine (BENTYL) 10 MG capsule prn ondansetron (ZOFRAN) 4 MG tablet fodmap diet   Anxiety and depression  paxil 20 mg qd helping   HM Flu shotutd 05/05/19 utd Consider shingrix in futureat pharmacy pna 23 had 03/10/16  S/p hysterectomy no cervix and f/u OB/GYNh/o abnormal pap s/p cryo  -pap 05/08/15 neg papwill Repeat In 5 years   Colonoscopy Dr. Allen Norris 01/2017 polyps, gastritis, diverticulosis  Mammogram3/18/2020 negativere ordered sch 09/01/19 left dx mammogram  DEXAneg 10/13/2018  -we discussed possible serious and likely etiologies, options for evaluation and workup, limitations of telemedicine visit vs in person visit, treatment, treatment risks and precautions. Pt prefers to treat via telemedicine empirically rather then risking or undertaking an in person visit at this moment. Patient agrees to seek prompt in person care if worsening, new symptoms arise, or if is not improving with treatment.   I discussed the assessment and treatment plan with the patient. The patient was provided an opportunity to ask questions and all were answered. The patient agreed with the plan and demonstrated an understanding of the  instructions.   The patient was advised to call back or seek an in-person evaluation if the symptoms worsen or if the condition fails to improve as anticipated.  Time spent 25 minutes  Delorise Jackson, MD

## 2019-08-13 LAB — NOVEL CORONAVIRUS, NAA: SARS-CoV-2, NAA: NOT DETECTED

## 2019-08-16 ENCOUNTER — Telehealth: Payer: Self-pay | Admitting: Internal Medicine

## 2019-08-16 ENCOUNTER — Other Ambulatory Visit: Payer: Self-pay | Admitting: Internal Medicine

## 2019-08-16 DIAGNOSIS — R1032 Left lower quadrant pain: Secondary | ICD-10-CM

## 2019-08-16 DIAGNOSIS — K5792 Diverticulitis of intestine, part unspecified, without perforation or abscess without bleeding: Secondary | ICD-10-CM

## 2019-08-16 MED ORDER — CIPROFLOXACIN HCL 500 MG PO TABS: 500.0000 mg | ORAL_TABLET | Freq: Two times a day (BID) | ORAL | 0 refills | Status: DC

## 2019-08-16 MED ORDER — METRONIDAZOLE 500 MG PO TABS: 500.0000 mg | ORAL_TABLET | Freq: Two times a day (BID) | ORAL | 0 refills | Status: DC

## 2019-08-16 NOTE — Telephone Encounter (Signed)
Referred GI  Sent cipro/flagyl to pharmacy could be diverticulitis Inform pt  Tylenol for pain   tMS

## 2019-08-16 NOTE — Telephone Encounter (Signed)
Pt states that miralax is not working. She is hurting on her left side-abdominal pain. She states she only feels relief while laying down on left side. Pt states that she does not want to go to urgent care.

## 2019-08-16 NOTE — Telephone Encounter (Signed)
Patient has been informed.

## 2019-08-18 DIAGNOSIS — G4733 Obstructive sleep apnea (adult) (pediatric): Secondary | ICD-10-CM | POA: Diagnosis not present

## 2019-08-18 DIAGNOSIS — G4710 Hypersomnia, unspecified: Secondary | ICD-10-CM | POA: Diagnosis not present

## 2019-08-18 DIAGNOSIS — I259 Chronic ischemic heart disease, unspecified: Secondary | ICD-10-CM | POA: Diagnosis not present

## 2019-08-22 ENCOUNTER — Other Ambulatory Visit: Payer: Self-pay

## 2019-08-22 ENCOUNTER — Telehealth: Payer: Self-pay | Admitting: Internal Medicine

## 2019-08-22 ENCOUNTER — Ambulatory Visit (HOSPITAL_COMMUNITY)
Admission: RE | Admit: 2019-08-22 | Discharge: 2019-08-22 | Disposition: A | Discharge status: Home / Self Care | Payer: Medicare HMO | Source: Ambulatory Visit | Attending: Cardiology | Admitting: Cardiology

## 2019-08-22 DIAGNOSIS — Z9889 Other specified postprocedural states: Secondary | ICD-10-CM | POA: Insufficient documentation

## 2019-08-22 DIAGNOSIS — I739 Peripheral vascular disease, unspecified: Secondary | ICD-10-CM | POA: Diagnosis present

## 2019-08-22 NOTE — Telephone Encounter (Signed)
Please change labs to [facility], pt was tested for COVID on 115/2021.

## 2019-08-22 NOTE — Telephone Encounter (Signed)
Labs are not urgent  Reschedule 2nd week of feb 2021 or anytime before next appt 1-2 weeks Reschedule in office   [place]

## 2019-08-22 NOTE — Telephone Encounter (Signed)
Lm to call office to set up lab appt in February.

## 2019-08-23 ENCOUNTER — Telehealth: Payer: Self-pay | Admitting: *Deleted

## 2019-08-23 NOTE — Telephone Encounter (Signed)
Left message informing pt of Dr. Stephenie Acres review of results and recommendations.

## 2019-08-23 NOTE — Telephone Encounter (Signed)
-----   Message from Garrel Ridgel, [place] sent at 08/23/2019  7:08 AM EST ----- Vascular eval was normal and they want to F/U with her in one year.

## 2019-08-24 ENCOUNTER — Other Ambulatory Visit: Payer: Medicare HMO

## 2019-08-25 ENCOUNTER — Other Ambulatory Visit: Payer: Self-pay

## 2019-08-25 ENCOUNTER — Ambulatory Visit
Admission: RE | Admit: 2019-08-25 | Discharge: 2019-08-25 | Disposition: A | Discharge status: Home / Self Care | Payer: Medicare HMO | Source: Ambulatory Visit | Attending: Gastroenterology | Admitting: Gastroenterology

## 2019-08-25 ENCOUNTER — Other Ambulatory Visit
Admission: RE | Admit: 2019-08-25 | Discharge: 2019-08-25 | Disposition: A | Discharge status: Home / Self Care | Payer: Medicare HMO | Source: Ambulatory Visit | Attending: Gastroenterology | Admitting: Gastroenterology

## 2019-08-25 ENCOUNTER — Telehealth: Payer: Self-pay

## 2019-08-25 ENCOUNTER — Encounter: Payer: Self-pay | Admitting: Gastroenterology

## 2019-08-25 ENCOUNTER — Ambulatory Visit (INDEPENDENT_AMBULATORY_CARE_PROVIDER_SITE_OTHER): Payer: Medicare HMO | Admitting: Gastroenterology

## 2019-08-25 ENCOUNTER — Other Ambulatory Visit: Payer: Self-pay | Admitting: Gastroenterology

## 2019-08-25 VITALS — BP 109/75 | HR 86 | Temp 98.1°F | Resp 17 | Ht 65.0 in | Wt 199.0 lb

## 2019-08-25 DIAGNOSIS — R1032 Left lower quadrant pain: Secondary | ICD-10-CM

## 2019-08-25 DIAGNOSIS — K5730 Diverticulosis of large intestine without perforation or abscess without bleeding: Secondary | ICD-10-CM | POA: Diagnosis not present

## 2019-08-25 DIAGNOSIS — I10 Essential (primary) hypertension: Secondary | ICD-10-CM | POA: Diagnosis not present

## 2019-08-25 DIAGNOSIS — K5790 Diverticulosis of intestine, part unspecified, without perforation or abscess without bleeding: Secondary | ICD-10-CM | POA: Diagnosis not present

## 2019-08-25 DIAGNOSIS — K5909 Other constipation: Secondary | ICD-10-CM

## 2019-08-25 LAB — CBC WITH DIFFERENTIAL/PLATELET
Abs Immature Granulocytes: 0.02 10*3/uL (ref 0.00–0.07)
Basophils Absolute: 0 10*3/uL (ref 0.0–0.1)
Basophils Relative: 1 %
Eosinophils Absolute: 0.1 10*3/uL (ref 0.0–0.5)
Eosinophils Relative: 2 %
HCT: 45 % (ref 36.0–46.0)
Hemoglobin: 14.1 g/dL (ref 12.0–15.0)
Immature Granulocytes: 0 %
Lymphocytes Relative: 47 %
Lymphs Abs: 2.8 10*3/uL (ref 0.7–4.0)
MCH: 29 pg (ref 26.0–34.0)
MCHC: 31.3 g/dL (ref 30.0–36.0)
MCV: 92.6 fL (ref 80.0–100.0)
Monocytes Absolute: 0.4 10*3/uL (ref 0.1–1.0)
Monocytes Relative: 6 %
Neutro Abs: 2.7 10*3/uL (ref 1.7–7.7)
Neutrophils Relative %: 44 %
Platelets: 205 10*3/uL (ref 150–400)
RBC: 4.86 MIL/uL (ref 3.87–5.11)
RDW: 14.6 % (ref 11.5–15.5)
WBC: 6.1 10*3/uL (ref 4.0–10.5)
nRBC: 0 % (ref 0.0–0.2)

## 2019-08-25 LAB — COMPREHENSIVE METABOLIC PANEL
ALT: 21 U/L (ref 0–44)
AST: 20 U/L (ref 15–41)
Albumin: 4.6 g/dL (ref 3.5–5.0)
Alkaline Phosphatase: 71 U/L (ref 38–126)
Anion gap: 11 (ref 5–15)
BUN: 14 mg/dL (ref 8–23)
CO2: 23 mmol/L (ref 22–32)
Calcium: 9.8 mg/dL (ref 8.9–10.3)
Chloride: 108 mmol/L (ref 98–111)
Creatinine, Ser: 1.02 mg/dL — ABNORMAL HIGH (ref 0.44–1.00)
GFR calc Af Amer: >60 mL/min (ref >60)
GFR calc non Af Amer: 59 mL/min — ABNORMAL LOW (ref >60)
Glucose, Bld: 97 mg/dL (ref 70–99)
Potassium: 4.1 mmol/L (ref 3.5–5.1)
Sodium: 142 mmol/L (ref 135–145)
Total Bilirubin: 0.6 mg/dL (ref 0.3–1.2)
Total Protein: 8.3 g/dL — ABNORMAL HIGH (ref 6.5–8.1)

## 2019-08-25 MED ORDER — GOLYTELY 236 G PO SOLR: 4000.0000 mL | Freq: Once | ORAL | 0 refills | Status: AC

## 2019-08-25 MED ORDER — IOHEXOL 300 MG/ML  SOLN
100.0000 mL | Freq: Once | INTRAMUSCULAR | Status: AC | PRN
  Administered 2019-08-25: 100 mL via INTRAVENOUS

## 2019-08-25 NOTE — Telephone Encounter (Signed)
CT Report called by Jeani Hawking at [facility].  Jeani Hawking reported "diverticulosis without diverticulitis, stable left adrenal lesion."  CT Dept [phone] Jeani Hawking)  Thanks,  [place], [place]

## 2019-08-25 NOTE — Telephone Encounter (Signed)
Please inform patient that there is no evidence of acute diverticulitis.  Will treat her constipation Send in prescription for GoLYTELY, bowel prep Start Linzess 290 MCG daily after bowel prep, she can pick up samples  Recommend EGD for follow-up of peptic ulcer disease  Cephas Darby, MD [address]  [place], [place] [address]  Main: [phone]  Fax: [phone] Pager: [phone]

## 2019-08-25 NOTE — Addendum Note (Signed)
Addended by: Cephas Darby on: 08/25/2019 09:36 AM   Modules accepted: Orders

## 2019-08-25 NOTE — Progress Notes (Signed)
Cephas Darby, MD [address]  [place]  [place], [place] [address]  Main: [phone]  Fax: [phone]    Gastroenterology Consultation  Referring Provider:     [name], Olivia Mackie * Primary Care Physician:  [name], [name], MD Primary Gastroenterologist:  Dr. Cephas Darby Reason for Consultation:   Left lower quadrant pain, chronic constipation        HPI:   [name] is a 59 y.o. female referred by Dr. Terese Door, [name], MD  for consultation & management of diffuse quadrant pain, chronic constipation.  Patient has history of chronic constipation almost all her life and fecal impaction at age of 60.  Patient saw Dr. Terese Door on 08/16/2019 secondary to severe left sided abdominal pain associated with nausea.  She was started on MiraLAX, Bentyl as well as empirically treated for acute sigmoid diverticulitis with 10 days course of Cipro and Flagyl.  Patient reports that she finished antibiotics however, pain persisted and she feels like something stuck in her left upper quadrant and has to come out, pain is predominantly in the left upper quadrant radiating to the back and lower abdomen.  She denies fever, chills.  She does feel nauseous, denies vomiting.  She denies abdominal bloating, rectal bleeding.  She reports her stools are watery, has tried enema and suppository this does not work. She does not smoke or drink alcohol Known history of sigmoid diverticulosis  NSAIDs: None  Antiplts/Anticoagulants/Anti thrombotics: None  GI Procedures:  EGD and colonoscopy in 2018 by Dr. Allen Norris - Small hiatal hernia. - Non-bleeding gastric ulcers with no stigmata of bleeding. Biopsied. - Gastritis. Biopsied. - Normal examined duodenum.  - Two 2 to 4 mm polyps in the sigmoid colon, removed with a cold snare. Resected and retrieved. - One 4 mm polyp in the ascending colon, removed with a cold snare. Resected and retrieved. - Diverticulosis in the  sigmoid colon. - Non-bleeding internal hemorrhoids.  DIAGNOSIS:  A. STOMACH ULCERATION; COLD BIOPSY:  - MILD ACTIVE GASTRITIS WITH ULCERATION.  - NEGATIVE FOR DYSPLASIA AND MALIGNANCY.   ADDENDUM:  Immunohistochemical stain for H. pylori is negative with an appropriate  control.   B. COLON POLYP, ASCENDING; COLD SNARE:  - TUBULAR ADENOMA.  - NEGATIVE FOR HIGH-GRADE DYSPLASIA AND MALIGNANCY.   C. COLON POLYP 2, SIGMOID; COLD SNARE:  - TUBULAR ADENOMAS (2).  - NEGATIVE FOR HIGH-GRADE DYSPLASIA AND MALIGNANCY.   EGD and colonoscopy in 2016 by Dr. Allen Norris  DIAGNOSIS:  A. COLON POLYP, ASCENDING; HOT SNARE:  - TUBULAR ADENOMA, 1.0 CM FRAGMENT AND SEVERAL SMALLER FRAGMENTS.  - NEGATIVE FOR HIGH-GRADE DYSPLASIA AND MALIGNANCY.   Past Medical History:  Diagnosis Date  . Allergy   . Anemia   . Arthritis   . Asthma   . Bacterial vaginitis   . Blood in stool   . Brachial neuritis or radiculitis NOS   . 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 being followed by neurosurgery/eye doctor and pcp. monitoring size.causes sinus problems  . Bronchitis   . Cardiac arrhythmia   . Cervical neck pain with evidence of disc disease    C5/6 disease, MRI done late 2012 - no records available  . Chronic combined systolic and diastolic CHF (congestive heart failure) ([place])    a. 08/2010 Echo: mildly reduced EF 40-45%, mild diffuse hypokinesis; b. 06/2016 Echo: EF 50%, no rwma, mild to mod TR; c. 03/2017 Echo: EF 40-45%, Gr1 DD (prior echo reviewed and EF felt to be  lower than reported).  . Chronic pain   . Cocaine abuse, in remission ([place])    clean x 24 years  . Colon polyps   . COPD (chronic obstructive pulmonary disease) ([place])    a. 12/2018 PFT: No obvious obst/restrictive dzs.  . Coronary artery disease    a. PCI of LCX 2003; b. PCI of the LAD 2012 with a (2.5 x 8 mm BMS);  c.s/p CABG 4/12: L-LAD, VG-Dx, VG-OM, VG-RCA (Dr. Prescott Gum);  d. 01/2012 MV: inf infarct, attenuation, no  ischemia; e. 02/2017 MV: signifi attenuation artifact. Fixed basal antlat/inflat scar vs artifact. Reversible apical lat and mid antlat defect - ? atten vs ischemia. F/u echo w/o wma->Med rx.  . Depression   . Diabetes mellitus without complication ([place])   . Family history of colon cancer   . Generalized headaches    frequent  . GERD (gastroesophageal reflux disease)   . Headache   . Heart murmur   . Hematochezia   . Hepatitis    history of hepatitis b  . History of cervical cancer    s/p cryotherapy  . History of drug abuse ([place])    cocaine, marijuana, clean since 1989  . History of hepatitis B    from eating undercooked liver  . History of MI (myocardial infarction)   . Hyperlipidemia   . Hypertension   . Ischemic cardiomyopathy    a. 03/2017 Echo: EF 40-45%, Gr1 DD.  [name] Myocardial infarction [facility]) 2003, 2012  . PAD (peripheral artery disease) ([place])    a. s/p Right SFA atherectomy and PTA 01/15/11; b. 07/2018 ABI: R 1.02, L 1.09.  [name] Pituitary mass ([place])    a. 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 Mass abuts R cavernous sinus w/o definite invasion.  . Polyp of colon   . Seasonal allergies   . Sleep apnea    uses cpap  . Smoking history    quit 07/2010  . Thyroid disease   . Urinary incontinence     Past Surgical History:  Procedure Laterality Date  . ABDOMINAL HYSTERECTOMY     2003  . CHOLECYSTECTOMY  1986  . COLONOSCOPY  2008   3 polyps  . COLONOSCOPY WITH PROPOFOL N/A 02/06/2015   Procedure: COLONOSCOPY WITH PROPOFOL;  Surgeon: Lucilla Lame, MD;  Location: [facility] ENDOSCOPY;  Service: Endoscopy;  Laterality: N/A;  . COLONOSCOPY WITH PROPOFOL N/A 01/27/2017   Procedure: COLONOSCOPY WITH PROPOFOL;  Surgeon: Lucilla Lame, MD;  Location: [facility] ENDOSCOPY;  Service: Endoscopy;  Laterality: N/A;  . CORONARY ANGIOPLASTY     w/ stent placement x2  . CORONARY ARTERY BYPASS GRAFT  2012   Dr [name]  . CORONARY STENT PLACEMENT  2003   S/P MI  . CORONARY STENT  PLACEMENT  2007   [place]  . ESOPHAGOGASTRODUODENOSCOPY (EGD) WITH PROPOFOL N/A 02/06/2015   Procedure: ESOPHAGOGASTRODUODENOSCOPY (EGD) WITH PROPOFOL;  Surgeon: Lucilla Lame, MD;  Location: [facility] ENDOSCOPY;  Service: Endoscopy;  Laterality: N/A;  . ESOPHAGOGASTRODUODENOSCOPY (EGD) WITH PROPOFOL N/A 01/27/2017   Procedure: ESOPHAGOGASTRODUODENOSCOPY (EGD) WITH PROPOFOL;  Surgeon: Lucilla Lame, MD;  Location: [facility] ENDOSCOPY;  Service: Endoscopy;  Laterality: N/A;  . FEMORAL ARTERY STENT  10/2010   right sided (Dr. Burt Knack)  . KNEE ARTHROSCOPY WITH MEDIAL MENISECTOMY Right 08/20/2017   Procedure: KNEE ARTHROSCOPY WITH MEDIAL  AND LATERAL MENISECTOMY;  Surgeon: Hessie Knows, MD;  Location: [facility] ORS;  Service: Orthopedics;  Laterality: Right;  . POSTERIOR CERVICAL LAMINECTOMY N/A 03/08/2018   Procedure:  POSTERIOR CERVICAL LAMINECTOMY-C7;  Surgeon: Deetta Perla, MD;  Location: [facility] ORS;  Service: Neurosurgery;  Laterality: N/A;  . TONSILLECTOMY    . TOTAL KNEE ARTHROPLASTY Right 04/14/2019   Procedure: RIGHT TOTAL KNEE ARTHROPLASTY;  Surgeon: Hessie Knows, MD;  Location: [facility] ORS;  Service: Orthopedics;  Laterality: Right;  . TUBAL LIGATION      Current Outpatient Medications:  .  albuterol (VENTOLIN HFA) 108 (90 Base) MCG/ACT inhaler, Inhale 2 puffs into the lungs every 6 (six) hours as needed for wheezing or shortness of breath., Disp: 1 g, Rfl: 12 .  aspirin EC 81 MG tablet, Take 1 tablet (81 mg total) by mouth daily., Disp: , Rfl:  .  Azelastine HCl 0.15 % SOLN, Place 2 sprays into both nostrils daily as needed (allergies). , Disp: , Rfl:  .  dicyclomine (BENTYL) 10 MG capsule, Take 1 capsule (10 mg total) by mouth 3 (three) times daily before meals. As needed for abdominal pain, Disp: 120 capsule, Rfl: 0 .  esomeprazole (NEXIUM) 40 MG capsule, Take 1 capsule (40 mg total) by mouth daily. Reported on 09/04/2015, Disp: 90 capsule, Rfl: 3 .  Evolocumab (REPATHA SURECLICK) [id number] MG/ML SOAJ, Inject 1 pen into  the skin every 14 (fourteen) days., Disp: 2 pen, Rfl: 10 .  ezetimibe (ZETIA) 10 MG tablet, Take 1 tablet (10 mg total) by mouth daily., Disp: 90 tablet, Rfl: 3 .  fluticasone (FLONASE) 50 MCG/ACT nasal spray, Place 2 sprays into both nostrils daily as needed for allergies. , Disp: , Rfl:  .  fluticasone (FLOVENT HFA) 110 MCG/ACT inhaler, Inhale 1 puff into the lungs 2 (two) times a day. Rinse mouth, Disp: 1 Inhaler, Rfl: 12 .  furosemide (LASIX) 20 MG tablet, TAKE 1 TABLET BY MOUTH ONCE DAILY AND A SECOND TABLET IF NEEDED FOR FLUID BUILD UP., Disp: 180 tablet, Rfl: 0 .  glucose blood (ONE TOUCH ULTRA TEST) test strip, Use as instructed check cbg qd E11.9, Disp: 100 each, Rfl: 12 .  hydrOXYzine (ATARAX/VISTARIL) 25 MG tablet, Take 1-2 tablets (25-50 mg total) by mouth daily as needed., Disp: 60 tablet, Rfl: 2 .  ipratropium-albuterol (DUONEB) 0.5-2.5 (3) MG/3ML SOLN, Take 3 mLs by nebulization 2 (two) times daily as needed. (Patient taking differently: Take 3 mLs by nebulization 2 (two) times daily as needed (wheezing/shortness of breath). ), Disp: 360 mL, Rfl: 12 .  isosorbide mononitrate (IMDUR) 30 MG 24 hr tablet, Take 1 tablet (30 mg total) by mouth 2 (two) times daily., Disp: 180 tablet, Rfl: 1 .  Lactobacillus (ACIDOPHILUS PROBIOTIC) 10 MG CAPS, Take 1 capsule by mouth daily., Disp: 30 capsule, Rfl: 2 .  Lancets MISC, 1 Device by Does not apply route daily. Lancets E11.9, Disp: 90 each, Rfl: 3 .  levocetirizine (XYZAL) 5 MG tablet, Take 1 tablet (5 mg total) by mouth at bedtime as needed for allergies., Disp: 90 tablet, Rfl: 3 .  meclizine (ANTIVERT) 25 MG tablet, Take 25 mg by mouth 3 (three) times daily. , Disp: , Rfl: 3 .  metFORMIN (GLUCOPHAGE) 500 MG tablet, Take 1 tablet (500 mg total) by mouth daily with breakfast., Disp: 90 tablet, Rfl: 3 .  methocarbamol (ROBAXIN) 500 MG tablet, Take 1 tablet (500 mg total) by mouth every 6 (six) hours as needed for muscle spasms., Disp: 30 tablet, Rfl:  0 .  metoprolol tartrate (LOPRESSOR) 25 MG tablet, Take 1 tablet (25 mg total) by mouth 2 (two) times daily., Disp: 180 tablet, Rfl: 3 .  mirabegron ER (  MYRBETRIQ) 25 MG TB24 tablet, Take 1 tablet (25 mg total) by mouth daily. (Patient taking differently: Take 25 mg by mouth daily as needed (urinary frequency). ), Disp: 30 tablet, Rfl: 11 .  mupirocin ointment (BACTROBAN) 2 %, Apply 1 application topically 2 (two) times daily. Prn scalp x 1 week and nose x 5 days, Disp: 30 g, Rfl: 0 .  nitroGLYCERIN (NITROSTAT) 0.4 MG SL tablet, Place 1 tablet (0.4 mg total) under the tongue every 5 (five) minutes as needed., Disp: 25 tablet, Rfl: 6 .  ondansetron (ZOFRAN) 4 MG tablet, Take 1 tablet (4 mg total) by mouth every 8 (eight) hours as needed for nausea or vomiting., Disp: 20 tablet, Rfl: 0 .  PARoxetine (PAXIL) 20 MG tablet, Take 1 tablet (20 mg total) by mouth daily., Disp: 90 tablet, Rfl: 3 .  potassium chloride (KLOR-CON) 10 MEQ tablet, Take 1 tablet by mouth once daily, Disp: 90 tablet, Rfl: 0 .  rivaroxaban (XARELTO) 2.5 MG TABS tablet, Take 1 tablet (2.5 mg total) by mouth 2 (two) times daily., Disp: 60 tablet, Rfl: 5 .  rosuvastatin (CRESTOR) 20 MG tablet, Take 1 tablet (20 mg total) by mouth at bedtime., Disp: 90 tablet, Rfl: 3 .  senna-docusate (SENOKOT-S) 8.6-50 MG tablet, Take 1 tablet by mouth daily as needed for mild constipation or moderate constipation., Disp: 30 tablet, Rfl: 12 .  umeclidinium-vilanterol (ANORO ELLIPTA) 62.5-25 MCG/INH AEPB, Inhale 1 puff into the lungs daily., Disp: 60 each, Rfl: 12 .  Wheat Dextrin (BENEFIBER) POWD, 2 teaspoons added to 4-8 ounces of liquid or food up to 3x per day prn, Disp: 475 g, Rfl: 11 .  ciprofloxacin (CIPRO) 500 MG tablet, Take 1 tablet (500 mg total) by mouth 2 (two) times daily. (Patient not taking: Reported on 08/25/2019), Disp: 14 tablet, Rfl: 0 .  losartan (COZAAR) 25 MG tablet, Take 1 tablet (25 mg total) by mouth daily. (Patient taking  differently: Take 25 mg by mouth every evening. ), Disp: 90 tablet, Rfl: 3 .  metroNIDAZOLE (FLAGYL) 500 MG tablet, Take 1 tablet (500 mg total) by mouth 2 (two) times daily. With food (Patient not taking: Reported on 08/25/2019), Disp: 14 tablet, Rfl: 0 .  polyethylene glycol (MIRALAX) 17 g packet, Take 17 g by mouth daily. (Patient not taking: Reported on 08/25/2019), Disp: 14 each, Rfl: 0    Family History  Problem Relation Age of Onset  . Hypertension Father   . Heart failure Father   . Diabetes Father   . Colon cancer Father 36  . Glaucoma Father   . Cancer Father        colorectal   . Heart disease Father   . Other Father        glaucoma  . Breast cancer Mother 70       breast cancer, late 89's  . Cancer Mother        breast  . Brain cancer Sister   . Arthritis Sister   . Diabetes Sister   . Hypertension Sister   . Kidney disease Sister   . Diabetes Brother   . Hypertension Brother   . Other Sister        brain tumor   . Acute myelogenous leukemia Grandson        06/2019  . Coronary artery disease Neg Hx   . Stroke Neg Hx      Social History   Tobacco Use  . Smoking status: Former Smoker    Packs/day: 1.00  Years: 38.00    Pack years: 38.00    Types: Cigarettes    Quit date: 08/12/2010    Years since quitting: 9.0  . Smokeless tobacco: Never Used  Substance Use Topics  . Alcohol use: No  . Drug use: No    Types: Cocaine    Comment: Remote Hx (crack cocaine and marijuana)    Allergies as of 08/25/2019 - Review Complete 08/25/2019  Allergen Reaction Noted  . Latex Rash 08/23/2010  . Shellfish allergy Anaphylaxis   . Sulfonamide derivatives Anaphylaxis 08/23/2010  . Watermelon [citrullus vulgaris] Other (See Comments) 04/13/2017  . Soy allergy  03/10/2019  . Other Swelling 01/20/2012    Review of Systems:    All systems reviewed and negative except where noted in HPI.   Physical Exam:  BP 109/75 (BP Location: Left Arm, Patient Position: Sitting,  Cuff Size: Large)   Pulse 86   Temp 98.1 F (36.7 C)   Resp 17   Ht 5\' 5"  (1.651 m)   Wt 199 lb (90.3 kg)   BMI 33.12 kg/m  No LMP recorded. Patient has had a hysterectomy.  General:   Alert,  Well-developed, well-nourished, pleasant and cooperative in mild distress due to pain Head:  Normocephalic and atraumatic. Eyes:  Sclera clear, no icterus.   Conjunctiva pink. Ears:  Normal auditory acuity. Nose:  No deformity, discharge, or lesions. Mouth:  No deformity or lesions,oropharynx pink & moist. Neck:  Supple; no masses or thyromegaly. Lungs:  Respirations even and unlabored.  Clear throughout to auscultation.   No wheezes, crackles, or rhonchi. No acute distress. Heart:  Regular rate and rhythm; no murmurs, clicks, rubs, or gallops. Abdomen:  Normal bowel sounds. Soft, severe tenderness in the left upper quadrant, mild diffuse tenderness in lower abdomen and non-distended without masses, hepatosplenomegaly or hernias noted.  Voluntary guarding, no rebound tenderness.   Rectal: Not performed Msk:  Symmetrical without gross deformities. Good, equal movement & strength bilaterally. Pulses:  Normal pulses noted. Extremities:  No clubbing or edema.  No cyanosis. Neurologic:  Alert and oriented x3;  grossly normal neurologically. Skin:  Intact without significant lesions or rashes. No jaundice. Psych:  Alert and cooperative. Normal mood and affect.  Imaging Studies: Reviewed  Assessment and Plan:   [name] is a 59 y.o. female with metabolic syndrome, chronic constipation, history of gastric ulcers, H. pylori negative is seen in consultation for approximately 4 weeks history of worsening left-sided abdominal pain, empirically treated for acute diverticulitis with Cipro and Flagyl x10 days.  Given the pain is persistent and progressive, recommend stat CT abdomen and pelvis with contrast to rule out acute complicated diverticulitis.  If CT comes back negative for acute  diverticulitis, will treat her as severe constipation Check CBC with differential, BMP today  Follow up after the CT scan results   Cephas Darby, MD

## 2019-08-26 ENCOUNTER — Telehealth: Payer: Self-pay | Admitting: Gastroenterology

## 2019-08-26 ENCOUNTER — Other Ambulatory Visit: Payer: Self-pay

## 2019-08-26 DIAGNOSIS — K279 Peptic ulcer, site unspecified, unspecified as acute or chronic, without hemorrhage or perforation: Secondary | ICD-10-CM

## 2019-08-26 NOTE — Telephone Encounter (Signed)
Patient called & is asking for results . Please call.

## 2019-08-26 NOTE — Telephone Encounter (Signed)
Pt has been notified of results and procedure has been scheduled for 09/01/2019, pt verbalized understanding

## 2019-08-29 ENCOUNTER — Other Ambulatory Visit
Admission: RE | Admit: 2019-08-29 | Discharge: 2019-08-29 | Disposition: A | Discharge status: Home / Self Care | Payer: Medicare HMO | Source: Ambulatory Visit | Attending: Gastroenterology | Admitting: Gastroenterology

## 2019-08-29 DIAGNOSIS — Z20822 Contact with and (suspected) exposure to covid-19: Secondary | ICD-10-CM | POA: Insufficient documentation

## 2019-08-29 DIAGNOSIS — Z01812 Encounter for preprocedural laboratory examination: Secondary | ICD-10-CM | POA: Diagnosis not present

## 2019-08-30 ENCOUNTER — Other Ambulatory Visit: Admission: RE | Admit: 2019-08-30 | Payer: Medicare HMO | Source: Ambulatory Visit

## 2019-08-30 ENCOUNTER — Telehealth: Payer: Self-pay

## 2019-08-30 LAB — SARS CORONAVIRUS 2 (TAT 6-24 HRS): SARS Coronavirus 2: NEGATIVE

## 2019-08-30 NOTE — Telephone Encounter (Signed)
PA started through Covermymeds.   [name] (Key: [id number]) Rx #: [id number] Repatha SureClick 140MG /ML auto-injectors  Awaiting response.

## 2019-08-31 ENCOUNTER — Encounter: Payer: Self-pay | Admitting: Gastroenterology

## 2019-08-31 ENCOUNTER — Telehealth: Payer: Self-pay

## 2019-08-31 NOTE — Telephone Encounter (Signed)
Patient contacted office states that she has been prescribed bowel prep, and she can not take it because she had a quadruple bypass. I reviewed her chart to see what procedure she is having tomorrow and she is scheduled for an EGD.  I informed her no bowel prep is needed for an EGD.  She is also on a blood thinner Xarelto.  She has not been advised to stop the blood thinner prior to her EGD.  Please advise if she needs to stop Xarelto.  Thanks, [place], [place]

## 2019-08-31 NOTE — Telephone Encounter (Signed)
Patient has been informed that she is scheduled for an EGD tomorrow.  Advised that she should stop Xarelto today and tomorrow.  Do not eat or drink anything after midnight tonight to prepare for the EGD.  In regards to the bowel prep goltytely she received-this is to help her with constipation she is experiencing.  She has been advised to do one day of clear liquids, drink lots of fluids and follow mixing instructions for goltyely drink 8 oz every 30 mins to help alleviate the constipation.  She has also been asked to stop by the office to pick up samples of fiber choice.  She states she did not have the funds to purchase a fiber supplement.  Thanks,  [place], [place]

## 2019-08-31 NOTE — Telephone Encounter (Signed)
[employer]: Prior authorization approved for Repatha sureclick injection for coverage from 07/29/2019 to 07/27/2020.  Patient has been notified of approval as well as contacted [place].

## 2019-09-01 ENCOUNTER — Ambulatory Visit: Payer: Medicare HMO | Admitting: Certified Registered Nurse Anesthetist

## 2019-09-01 ENCOUNTER — Telehealth: Payer: Self-pay

## 2019-09-01 ENCOUNTER — Encounter: Payer: Self-pay | Admitting: Gastroenterology

## 2019-09-01 ENCOUNTER — Ambulatory Visit
Admission: RE | Admit: 2019-09-01 | Discharge: 2019-09-01 | Disposition: A | Discharge status: Home / Self Care | Payer: Medicare HMO | Source: Ambulatory Visit | Attending: Internal Medicine | Admitting: Internal Medicine

## 2019-09-01 ENCOUNTER — Ambulatory Visit
Admission: RE | Admit: 2019-09-01 | Discharge: 2019-09-01 | Disposition: A | Discharge status: Home / Self Care | Payer: Medicare HMO | Attending: Gastroenterology | Admitting: Gastroenterology

## 2019-09-01 ENCOUNTER — Encounter
Admission: RE | Disposition: A | Discharge status: Home / Self Care | Payer: Self-pay | Source: Home / Self Care | Attending: Gastroenterology

## 2019-09-01 ENCOUNTER — Other Ambulatory Visit: Payer: Self-pay

## 2019-09-01 DIAGNOSIS — Z91040 Latex allergy status: Secondary | ICD-10-CM | POA: Insufficient documentation

## 2019-09-01 DIAGNOSIS — Z9049 Acquired absence of other specified parts of digestive tract: Secondary | ICD-10-CM | POA: Insufficient documentation

## 2019-09-01 DIAGNOSIS — Z808 Family history of malignant neoplasm of other organs or systems: Secondary | ICD-10-CM | POA: Insufficient documentation

## 2019-09-01 DIAGNOSIS — Z8619 Personal history of other infectious and parasitic diseases: Secondary | ICD-10-CM | POA: Insufficient documentation

## 2019-09-01 DIAGNOSIS — F329 Major depressive disorder, single episode, unspecified: Secondary | ICD-10-CM | POA: Diagnosis not present

## 2019-09-01 DIAGNOSIS — E079 Disorder of thyroid, unspecified: Secondary | ICD-10-CM | POA: Insufficient documentation

## 2019-09-01 DIAGNOSIS — Z91013 Allergy to seafood: Secondary | ICD-10-CM | POA: Insufficient documentation

## 2019-09-01 DIAGNOSIS — I5042 Chronic combined systolic (congestive) and diastolic (congestive) heart failure: Secondary | ICD-10-CM | POA: Diagnosis not present

## 2019-09-01 DIAGNOSIS — K219 Gastro-esophageal reflux disease without esophagitis: Secondary | ICD-10-CM | POA: Insufficient documentation

## 2019-09-01 DIAGNOSIS — J449 Chronic obstructive pulmonary disease, unspecified: Secondary | ICD-10-CM | POA: Insufficient documentation

## 2019-09-01 DIAGNOSIS — K259 Gastric ulcer, unspecified as acute or chronic, without hemorrhage or perforation: Secondary | ICD-10-CM | POA: Insufficient documentation

## 2019-09-01 DIAGNOSIS — Z8489 Family history of other specified conditions: Secondary | ICD-10-CM | POA: Insufficient documentation

## 2019-09-01 DIAGNOSIS — N644 Mastodynia: Secondary | ICD-10-CM | POA: Diagnosis not present

## 2019-09-01 DIAGNOSIS — Z90710 Acquired absence of both cervix and uterus: Secondary | ICD-10-CM | POA: Diagnosis not present

## 2019-09-01 DIAGNOSIS — Z7982 Long term (current) use of aspirin: Secondary | ICD-10-CM | POA: Insufficient documentation

## 2019-09-01 DIAGNOSIS — Z8711 Personal history of peptic ulcer disease: Secondary | ICD-10-CM | POA: Diagnosis not present

## 2019-09-01 DIAGNOSIS — Z955 Presence of coronary angioplasty implant and graft: Secondary | ICD-10-CM | POA: Insufficient documentation

## 2019-09-01 DIAGNOSIS — Z882 Allergy status to sulfonamides status: Secondary | ICD-10-CM | POA: Insufficient documentation

## 2019-09-01 DIAGNOSIS — E1151 Type 2 diabetes mellitus with diabetic peripheral angiopathy without gangrene: Secondary | ICD-10-CM | POA: Diagnosis not present

## 2019-09-01 DIAGNOSIS — K449 Diaphragmatic hernia without obstruction or gangrene: Secondary | ICD-10-CM | POA: Insufficient documentation

## 2019-09-01 DIAGNOSIS — I252 Old myocardial infarction: Secondary | ICD-10-CM | POA: Diagnosis not present

## 2019-09-01 DIAGNOSIS — Z96651 Presence of right artificial knee joint: Secondary | ICD-10-CM | POA: Insufficient documentation

## 2019-09-01 DIAGNOSIS — Z8541 Personal history of malignant neoplasm of cervix uteri: Secondary | ICD-10-CM | POA: Insufficient documentation

## 2019-09-01 DIAGNOSIS — I110 Hypertensive heart disease with heart failure: Secondary | ICD-10-CM | POA: Insufficient documentation

## 2019-09-01 DIAGNOSIS — I255 Ischemic cardiomyopathy: Secondary | ICD-10-CM | POA: Insufficient documentation

## 2019-09-01 DIAGNOSIS — R519 Headache, unspecified: Secondary | ICD-10-CM | POA: Insufficient documentation

## 2019-09-01 DIAGNOSIS — Z806 Family history of leukemia: Secondary | ICD-10-CM | POA: Insufficient documentation

## 2019-09-01 DIAGNOSIS — R928 Other abnormal and inconclusive findings on diagnostic imaging of breast: Secondary | ICD-10-CM | POA: Diagnosis not present

## 2019-09-01 DIAGNOSIS — Z8249 Family history of ischemic heart disease and other diseases of the circulatory system: Secondary | ICD-10-CM | POA: Insufficient documentation

## 2019-09-01 DIAGNOSIS — Z86010 Personal history of colonic polyps: Secondary | ICD-10-CM | POA: Insufficient documentation

## 2019-09-01 DIAGNOSIS — I509 Heart failure, unspecified: Secondary | ICD-10-CM | POA: Diagnosis not present

## 2019-09-01 DIAGNOSIS — M542 Cervicalgia: Secondary | ICD-10-CM | POA: Diagnosis not present

## 2019-09-01 DIAGNOSIS — E785 Hyperlipidemia, unspecified: Secondary | ICD-10-CM | POA: Diagnosis not present

## 2019-09-01 DIAGNOSIS — I130 Hypertensive heart and chronic kidney disease with heart failure and stage 1 through stage 4 chronic kidney disease, or unspecified chronic kidney disease: Secondary | ICD-10-CM | POA: Diagnosis not present

## 2019-09-01 DIAGNOSIS — Z833 Family history of diabetes mellitus: Secondary | ICD-10-CM | POA: Insufficient documentation

## 2019-09-01 DIAGNOSIS — Z803 Family history of malignant neoplasm of breast: Secondary | ICD-10-CM | POA: Insufficient documentation

## 2019-09-01 DIAGNOSIS — Z800 Family history of malignant neoplasm of digestive organs: Secondary | ICD-10-CM | POA: Insufficient documentation

## 2019-09-01 DIAGNOSIS — N6012 Diffuse cystic mastopathy of left breast: Secondary | ICD-10-CM | POA: Diagnosis not present

## 2019-09-01 DIAGNOSIS — F419 Anxiety disorder, unspecified: Secondary | ICD-10-CM | POA: Diagnosis not present

## 2019-09-01 DIAGNOSIS — I2510 Atherosclerotic heart disease of native coronary artery without angina pectoris: Secondary | ICD-10-CM | POA: Diagnosis not present

## 2019-09-01 DIAGNOSIS — Z951 Presence of aortocoronary bypass graft: Secondary | ICD-10-CM | POA: Insufficient documentation

## 2019-09-01 DIAGNOSIS — G4730 Sleep apnea, unspecified: Secondary | ICD-10-CM | POA: Diagnosis not present

## 2019-09-01 DIAGNOSIS — Z7984 Long term (current) use of oral hypoglycemic drugs: Secondary | ICD-10-CM | POA: Insufficient documentation

## 2019-09-01 DIAGNOSIS — Z91018 Allergy to other foods: Secondary | ICD-10-CM | POA: Insufficient documentation

## 2019-09-01 DIAGNOSIS — K279 Peptic ulcer, site unspecified, unspecified as acute or chronic, without hemorrhage or perforation: Secondary | ICD-10-CM | POA: Diagnosis not present

## 2019-09-01 DIAGNOSIS — M1990 Unspecified osteoarthritis, unspecified site: Secondary | ICD-10-CM | POA: Diagnosis not present

## 2019-09-01 DIAGNOSIS — Z79899 Other long term (current) drug therapy: Secondary | ICD-10-CM | POA: Insufficient documentation

## 2019-09-01 DIAGNOSIS — Z8261 Family history of arthritis: Secondary | ICD-10-CM | POA: Insufficient documentation

## 2019-09-01 DIAGNOSIS — I5022 Chronic systolic (congestive) heart failure: Secondary | ICD-10-CM | POA: Diagnosis not present

## 2019-09-01 DIAGNOSIS — Z87891 Personal history of nicotine dependence: Secondary | ICD-10-CM | POA: Insufficient documentation

## 2019-09-01 DIAGNOSIS — R011 Cardiac murmur, unspecified: Secondary | ICD-10-CM | POA: Diagnosis not present

## 2019-09-01 DIAGNOSIS — Z888 Allergy status to other drugs, medicaments and biological substances status: Secondary | ICD-10-CM | POA: Insufficient documentation

## 2019-09-01 DIAGNOSIS — Z83511 Family history of glaucoma: Secondary | ICD-10-CM | POA: Insufficient documentation

## 2019-09-01 HISTORY — PX: ESOPHAGOGASTRODUODENOSCOPY (EGD) WITH PROPOFOL: SHX5813

## 2019-09-01 LAB — GLUCOSE, CAPILLARY: Glucose-Capillary: 104 mg/dL — ABNORMAL HIGH (ref 70–99)

## 2019-09-01 SURGERY — ESOPHAGOGASTRODUODENOSCOPY (EGD) WITH PROPOFOL
Anesthesia: General

## 2019-09-01 MED ORDER — PROPOFOL 500 MG/50ML IV EMUL
INTRAVENOUS | Status: AC
  Filled 2019-09-01: qty 50

## 2019-09-01 MED ORDER — EPHEDRINE SULFATE 50 MG/ML IJ SOLN
INTRAMUSCULAR | Status: AC
  Filled 2019-09-01: qty 1

## 2019-09-01 MED ORDER — PHENYLEPHRINE HCL (PRESSORS) 10 MG/ML IV SOLN
INTRAVENOUS | Status: AC
  Filled 2019-09-01: qty 1

## 2019-09-01 MED ORDER — PROPOFOL 500 MG/50ML IV EMUL
INTRAVENOUS | Status: DC | PRN
  Administered 2019-09-01: 150 ug/kg/min via INTRAVENOUS

## 2019-09-01 MED ORDER — SODIUM CHLORIDE 0.9 % IV SOLN: INTRAVENOUS | Status: DC

## 2019-09-01 MED ORDER — PHENYLEPHRINE HCL (PRESSORS) 10 MG/ML IV SOLN
INTRAVENOUS | Status: DC | PRN
  Administered 2019-09-01: 100 ug via INTRAVENOUS

## 2019-09-01 MED ORDER — SODIUM CHLORIDE (PF) 0.9 % IJ SOLN
INTRAMUSCULAR | Status: AC
  Filled 2019-09-01: qty 10

## 2019-09-01 MED ORDER — LIDOCAINE HCL (PF) 2 % IJ SOLN
INTRAMUSCULAR | Status: AC
  Filled 2019-09-01: qty 10

## 2019-09-01 MED ORDER — PROPOFOL 10 MG/ML IV BOLUS
INTRAVENOUS | Status: DC | PRN
  Administered 2019-09-01: 20 mg via INTRAVENOUS
  Administered 2019-09-01: 50 mg via INTRAVENOUS

## 2019-09-01 MED ORDER — LIDOCAINE HCL (CARDIAC) PF 100 MG/5ML IV SOSY
PREFILLED_SYRINGE | INTRAVENOUS | Status: DC | PRN
  Administered 2019-09-01: 100 mg via INTRATRACHEAL

## 2019-09-01 NOTE — H&P (Signed)
Cephas Darby, MD [address]  [place]  [place], [place] [address]  Main: [phone]  Fax: [phone] Pager: [phone]  Primary Care Physician:  [name], [name], MD Primary Gastroenterologist:  Dr. Cephas Darby  Pre-Procedure History & Physical: HPI:  [name] is a 59 y.o. female is here for an endoscopy.   Past Medical History:  Diagnosis Date  . Allergy   . Anemia   . Arthritis   . Asthma   . Bacterial vaginitis   . Blood in stool   . Brachial neuritis or radiculitis NOS   . Brain tumor (benign) ([place]) 07/2017   near optic nerve. being followed by neurosurgery/eye doctor and pcp. monitoring size.causes sinus problems  . Bronchitis   . Cardiac arrhythmia   . Cervical neck pain with evidence of disc disease    C5/6 disease, MRI done late 2012 - no records available  . Chronic combined systolic and diastolic CHF (congestive heart failure) ([place])    a. 08/2010 Echo: mildly reduced EF 40-45%, mild diffuse hypokinesis; b. 06/2016 Echo: EF 50%, no rwma, mild to mod TR; c. 03/2017 Echo: EF 40-45%, Gr1 DD (prior echo reviewed and EF felt to be lower than reported).  . Chronic pain   . Cocaine abuse, in remission ([place])    clean x 24 years  . Colon polyps   . COPD (chronic obstructive pulmonary disease) ([place])    a. 12/2018 PFT: No obvious obst/restrictive dzs.  . Coronary artery disease    a. PCI of LCX 2003; b. PCI of the LAD 2012 with a (2.5 x 8 mm BMS);  c.s/p CABG 4/12: L-LAD, VG-Dx, VG-OM, VG-RCA (Dr. Prescott Gum);  d. 01/2012 MV: inf infarct, attenuation, no ischemia; e. 02/2017 MV: signifi attenuation artifact. Fixed basal antlat/inflat scar vs artifact. Reversible apical lat and mid antlat defect - ? atten vs ischemia. F/u echo w/o wma->Med rx.  . Depression   . Diabetes mellitus without complication ([place])   . Family history of colon cancer   . Generalized headaches    frequent  . GERD (gastroesophageal reflux disease)   . Headache   . Heart  murmur   . Hematochezia   . Hepatitis    history of hepatitis b  . History of cervical cancer    s/p cryotherapy  . History of drug abuse ([place])    cocaine, marijuana, clean since 1989  . History of hepatitis B    from eating undercooked liver  . History of MI (myocardial infarction)   . Hyperlipidemia   . Hypertension   . Ischemic cardiomyopathy    a. 03/2017 Echo: EF 40-45%, Gr1 DD.  [name] Myocardial infarction [facility]) 2003, 2012  . PAD (peripheral artery disease) ([place])    a. s/p Right SFA atherectomy and PTA 01/15/11; b. 07/2018 ABI: R 1.02, L 1.09.  [name] Pituitary mass ([place])    a. 12/2018 MRI Brain: Stable pituitary mass w/ 69mm area of necrosis. Mass abuts R cavernous sinus w/o definite invasion.  . Polyp of colon   . Seasonal allergies   . Sleep apnea    uses cpap  . Smoking history    quit 07/2010  . Thyroid disease   . Urinary incontinence     Past Surgical History:  Procedure Laterality Date  . ABDOMINAL HYSTERECTOMY     2003  . CHOLECYSTECTOMY  1986  . COLONOSCOPY  2008   3 polyps  . COLONOSCOPY WITH PROPOFOL N/A 02/06/2015   Procedure: COLONOSCOPY WITH PROPOFOL;  Surgeon: Lucilla Lame, MD;  Location: [facility] ENDOSCOPY;  Service: Endoscopy;  Laterality: N/A;  . COLONOSCOPY WITH PROPOFOL N/A 01/27/2017   Procedure: COLONOSCOPY WITH PROPOFOL;  Surgeon: Lucilla Lame, MD;  Location[facility] ENDOSCOPY;  Service: Endoscopy;  Laterality: N/A;  . CORONARY ANGIOPLASTY     w/ stent placement x2  . CORONARY ARTERY BYPASS GRAFT  2012   Dr [name]  . CORONARY STENT PLACEMENT  2003   S/P MI  . CORONARY STENT PLACEMENT  2007   [place]  . ESOPHAGOGASTRODUODENOSCOPY (EGD) WITH PROPOFOL N/A 02/06/2015   Procedure: ESOPHAGOGASTRODUODENOSCOPY (EGD) WITH PROPOFOL;  Surgeon: Lucilla Lame, MD;  Location: [facility] ENDOSCOPY;  Service: Endoscopy;  Laterality: N/A;  . ESOPHAGOGASTRODUODENOSCOPY (EGD) WITH PROPOFOL N/A 01/27/2017   Procedure: ESOPHAGOGASTRODUODENOSCOPY (EGD) WITH PROPOFOL;  Surgeon: Lucilla Lame, MD;   Location: [facility] ENDOSCOPY;  Service: Endoscopy;  Laterality: N/A;  . FEMORAL ARTERY STENT  10/2010   right sided (Dr. Burt Knack)  . KNEE ARTHROSCOPY WITH MEDIAL MENISECTOMY Right 08/20/2017   Procedure: KNEE ARTHROSCOPY WITH MEDIAL  AND LATERAL MENISECTOMY;  Surgeon: Hessie Knows, MD;  Location: [facility] ORS;  Service: Orthopedics;  Laterality: Right;  . POSTERIOR CERVICAL LAMINECTOMY N/A 03/08/2018   Procedure: POSTERIOR CERVICAL LAMINECTOMY-C7;  Surgeon: Deetta Perla, MD;  Location: [facility] ORS;  Service: Neurosurgery;  Laterality: N/A;  . TONSILLECTOMY    . TOTAL KNEE ARTHROPLASTY Right 04/14/2019   Procedure: RIGHT TOTAL KNEE ARTHROPLASTY;  Surgeon: Hessie Knows, MD;  Location: [facility] ORS;  Service: Orthopedics;  Laterality: Right;  . TUBAL LIGATION      Prior to Admission medications   Medication Sig Start Date End Date Taking? Authorizing Provider  albuterol (VENTOLIN HFA) 108 (90 Base) MCG/ACT inhaler Inhale 2 puffs into the lungs every 6 (six) hours as needed for wheezing or shortness of breath. 04/13/19  Yes [name], [name], MD  aspirin EC 81 MG tablet Take 1 tablet (81 mg total) by mouth daily. 04/06/19  Yes Theora Gianotti, NP  Azelastine HCl 0.15 % SOLN Place 2 sprays into both nostrils daily as needed (allergies).  10/31/16  Yes [provider]  esomeprazole (NEXIUM) 40 MG capsule Take 1 capsule (40 mg total) by mouth daily. Reported on 09/04/2015 06/14/19  Yes [name], [name], MD  Evolocumab (REPATHA SURECLICK) [id number] MG/ML SOAJ Inject 1 pen into the skin every 14 (fourteen) days. 06/08/19  Yes [name], Kathlene November, MD  ezetimibe (ZETIA) 10 MG tablet Take 1 tablet (10 mg total) by mouth daily. 12/29/18  Yes [name], Kathlene November, MD  fluticasone (FLONASE) 50 MCG/ACT nasal spray Place 2 sprays into both nostrils daily as needed for allergies.    Yes [provider]  fluticasone (FLOVENT HFA) 110 MCG/ACT inhaler Inhale 1 puff into the lungs 2 (two) times a day. Rinse  mouth 11/29/18  Yes [name], [name], MD  furosemide (LASIX) 20 MG tablet TAKE 1 TABLET BY MOUTH ONCE DAILY AND A SECOND TABLET IF NEEDED FOR FLUID BUILD UP. 05/27/19  Yes [name], Kathlene November, MD  hydrOXYzine (ATARAX/VISTARIL) 25 MG tablet Take 1-2 tablets (25-50 mg total) by mouth daily as needed. 07/14/19  Yes [name], [name], MD  ipratropium-albuterol (DUONEB) 0.5-2.5 (3) MG/3ML SOLN Take 3 mLs by nebulization 2 (two) times daily as needed. Patient taking differently: Take 3 mLs by nebulization 2 (two) times daily as needed (wheezing/shortness of breath).  07/09/18  Yes [name], [name], MD  isosorbide mononitrate (IMDUR) 30 MG 24 hr tablet Take 1 tablet (30 mg total) by mouth 2 (  two) times daily. 05/10/19  Yes [name], Kathlene November, MD  levocetirizine (XYZAL) 5 MG tablet Take 1 tablet (5 mg total) by mouth at bedtime as needed for allergies. 08/26/18  Yes [name], [name], MD  meclizine (ANTIVERT) 25 MG tablet Take 25 mg by mouth 3 (three) times daily.  01/05/18  Yes [provider]  metFORMIN (GLUCOPHAGE) 500 MG tablet Take 1 tablet (500 mg total) by mouth daily with breakfast. 03/10/19  Yes [name], [name], MD  metoprolol tartrate (LOPRESSOR) 25 MG tablet Take 1 tablet (25 mg total) by mouth 2 (two) times daily. 03/14/19  Yes [name], [name], MD  ondansetron (ZOFRAN) 4 MG tablet Take 1 tablet (4 mg total) by mouth every 8 (eight) hours as needed for nausea or vomiting. 08/12/19  Yes [name], [name], MD  PARoxetine (PAXIL) 20 MG tablet Take 1 tablet (20 mg total) by mouth daily. 07/14/19  Yes [name], [name], MD  potassium chloride (KLOR-CON) 10 MEQ tablet Take 1 tablet by mouth once daily 07/05/19  Yes [name], Kathlene November, MD  rosuvastatin (CRESTOR) 20 MG tablet Take 1 tablet (20 mg total) by mouth at bedtime. 08/26/18  Yes [name], [name], MD  senna-docusate (SENOKOT-S) 8.6-50 MG tablet Take 1 tablet by mouth daily as needed  for mild constipation or moderate constipation. 11/29/18  Yes [name], [name], MD  umeclidinium-vilanterol (ANORO ELLIPTA) 62.5-25 MCG/INH AEPB Inhale 1 puff into the lungs daily. 12/21/18  Yes [name], [name], MD  Wheat Dextrin (BENEFIBER) POWD 2 teaspoons added to 4-8 ounces of liquid or food up to 3x per day prn 11/29/18  Yes [name], [name], MD  ciprofloxacin (CIPRO) 500 MG tablet Take 1 tablet (500 mg total) by mouth 2 (two) times daily. Patient not taking: Reported on 08/25/2019 08/16/19   [name], [name], MD  dicyclomine (BENTYL) 10 MG capsule Take 1 capsule (10 mg total) by mouth 3 (three) times daily before meals. As needed for abdominal pain 08/12/19   [name], [name], MD  glucose blood (ONE TOUCH ULTRA TEST) test strip Use as instructed check cbg qd E11.9 04/05/18   [name], [name], MD  Lactobacillus (ACIDOPHILUS PROBIOTIC) 10 MG CAPS Take 1 capsule by mouth daily. 07/12/18   Jodelle Green, FNP  Lancets MISC 1 Device by Does not apply route daily. Lancets E11.9 04/05/18   [name], [name], MD  losartan (COZAAR) 25 MG tablet Take 1 tablet (25 mg total) by mouth daily. Patient taking differently: Take 25 mg by mouth every evening.  03/10/19 06/08/19  [name], [name], MD  methocarbamol (ROBAXIN) 500 MG tablet Take 1 tablet (500 mg total) by mouth every 6 (six) hours as needed for muscle spasms. 04/15/19   Duanne Guess, PA-C  metroNIDAZOLE (FLAGYL) 500 MG tablet Take 1 tablet (500 mg total) by mouth 2 (two) times daily. With food Patient not taking: Reported on 08/25/2019 08/16/19   [name], [name], MD  mirabegron ER (MYRBETRIQ) 25 MG TB24 tablet Take 1 tablet (25 mg total) by mouth daily. Patient taking differently: Take 25 mg by mouth daily as needed (urinary frequency).  01/21/19   [name], [name], MD  mupirocin ointment (BACTROBAN) 2 % Apply 1 application topically 2 (two) times daily. Prn scalp x 1 week and nose  x 5 days 08/26/18   [name], [name], MD  nitroGLYCERIN (NITROSTAT) 0.4 MG SL tablet Place 1 tablet (0.4 mg total) under the tongue every 5 (five) minutes as needed. 07/10/17  Minna Merritts, MD  polyethylene glycol (MIRALAX) 17 g packet Take 17 g by mouth daily. Patient not taking: Reported on 08/25/2019 08/12/19   [name], [name], MD  rivaroxaban (XARELTO) 2.5 MG TABS tablet Take 1 tablet (2.5 mg total) by mouth 2 (two) times daily. 03/15/19   Minna Merritts, MD    Allergies as of 08/26/2019 - Review Complete 08/25/2019  Allergen Reaction Noted  . Latex Rash 08/23/2010  . Shellfish allergy Anaphylaxis   . Sulfonamide derivatives Anaphylaxis 08/23/2010  . Watermelon [citrullus vulgaris] Other (See Comments) 04/13/2017  . Soy allergy  03/10/2019  . Other Swelling 01/20/2012    Family History  Problem Relation Age of Onset  . Hypertension Father   . Heart failure Father   . Diabetes Father   . Colon cancer Father 72  . Glaucoma Father   . Cancer Father        colorectal   . Heart disease Father   . Other Father        glaucoma  . Breast cancer Mother 81       breast cancer, late 74's  . Cancer Mother        breast  . Brain cancer Sister   . Arthritis Sister   . Diabetes Sister   . Hypertension Sister   . Kidney disease Sister   . Diabetes Brother   . Hypertension Brother   . Other Sister        brain tumor   . Acute myelogenous leukemia Grandson        06/2019  . Coronary artery disease Neg Hx   . Stroke Neg Hx     Social History   Socioeconomic History  . Marital status: Legally Separated    Spouse name: Not on file  . Number of children: Not on file  . Years of education: Not on file  . Highest education level: Not on file  Occupational History  . Not on file  Tobacco Use  . Smoking status: Former Smoker    Packs/day: 1.00    Years: 38.00    Pack years: 38.00    Types: Cigarettes    Quit date: 08/12/2010    Years since quitting: 9.0   . Smokeless tobacco: Never Used  Substance and Sexual Activity  . Alcohol use: No  . Drug use: No    Types: Cocaine    Comment: Remote Hx (crack cocaine and marijuana).none since 67yrs plus  . Sexual activity: Yes  Other Topics Concern  . 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 Difficulty of Paying Living Expenses: Not hard at all  Food Insecurity: No Food Insecurity  . Worried About Charity fundraiser in the Last Year: Never true  . Ran Out of Food in the Last Year: Never true  Transportation Needs: No Transportation Needs  . Lack of Transportation (Medical): No  . Lack of Transportation (Non-Medical): No  Physical Activity:   . Days  of Exercise per Week: Not on file  . Minutes of Exercise per Session: Not on file  Stress: No Stress Concern Present  . Feeling of Stress : Not at all  Social Connections: Unknown  . Frequency of Communication with Friends and Family: More than three times a week  . Frequency of Social Gatherings with Friends and Family: Once a week  . Attends Religious Services: 1 to 4 times per year  . Active Member of Clubs or Organizations: Yes  . Attends Archivist Meetings: More than 4 times per year  . Marital Status: Not on file  Intimate Partner Violence: Not At Risk  . Fear of Current or Ex-Partner: No  . Emotionally Abused: No  . Physically Abused: No  . Sexually Abused: No    Review of Systems: See HPI, otherwise negative ROS  Physical Exam: BP 124/77   Pulse 95   Temp (!) 97 F (36.1 C) (Temporal)   Resp 20   Ht  5\' 5"  (1.651 m)   Wt 90.3 kg   SpO2 100%   BMI 33.12 kg/m  General:   Alert,  pleasant and cooperative in NAD Head:  Normocephalic and atraumatic. Neck:  Supple; no masses or thyromegaly. Lungs:  Clear throughout to auscultation.    Heart:  Regular rate and rhythm. Abdomen:  Soft, nontender and nondistended. Normal bowel sounds, without guarding, and without rebound.   Neurologic:  Alert and  oriented x4;  grossly normal neurologically.  Impression/Plan: [name] is here for an endoscopy to be performed for follow up of PUD  Risks, benefits, limitations, and alternatives regarding  endoscopy have been reviewed with the patient.  Questions have been answered.  All parties agreeable.   Sherri Sear, MD  09/01/2019, 9:11 AM

## 2019-09-01 NOTE — Anesthesia Preprocedure Evaluation (Signed)
Anesthesia Evaluation  Patient identified by MRN, date of birth, ID band Patient awake    Reviewed: Allergy & Precautions, H&P , NPO status , Patient's Chart, lab work & pertinent test results, reviewed documented beta blocker date and time   Airway Mallampati: II   Neck ROM: full    Dental  (+) Poor Dentition   Pulmonary shortness of breath and with exertion, asthma , sleep apnea , COPD,  COPD inhaler, former smoker,    Pulmonary exam normal        Cardiovascular Exercise Tolerance: Poor hypertension, On Medications + CAD, + Past MI, + Peripheral Vascular Disease and +CHF  (-) Orthopnea Normal cardiovascular exam+ Valvular Problems/Murmurs  Rhythm:regular Rate:Normal     Neuro/Psych  Headaches, PSYCHIATRIC DISORDERS Anxiety Depression  Neuromuscular disease    GI/Hepatic PUD, GERD  Medicated,(+) Hepatitis -  Endo/Other  negative endocrine ROSdiabetes, Type 2, Oral Hypoglycemic Agents  Renal/GU negative Renal ROS  negative genitourinary   Musculoskeletal   Abdominal   Peds  Hematology  (+) Blood dyscrasia, anemia ,   Anesthesia Other Findings Past Medical History: No date: Allergy No date: Anemia No date: Arthritis No date: Asthma No date: Bacterial vaginitis No date: Blood in stool No date: Brachial neuritis or radiculitis NOS 07/2017: Brain tumor (benign) ([place])     Comment:  near optic nerve. being followed by neurosurgery/eye               doctor and pcp. monitoring size.causes sinus problems No date: Bronchitis No date: Cardiac arrhythmia No date: Cervical neck pain with evidence of disc disease     Comment:  C5/6 disease, MRI done late 2012 - no records available No date: Chronic combined systolic and diastolic CHF (congestive  heart failure) ([place])     Comment:  a. 08/2010 Echo: mildly reduced EF 40-45%, mild diffuse               hypokinesis; b. 06/2016 Echo: EF 50%, no rwma, mild to               mod  TR; c. 03/2017 Echo: EF 40-45%, Gr1 DD (prior echo               reviewed and EF felt to be lower than reported). No date: Chronic pain No date: Cocaine abuse, in remission [facility])     Comment:  clean x 24 years No date: Colon polyps No date: COPD (chronic obstructive pulmonary disease) ([place])     Comment:  a. 12/2018 PFT: No obvious obst/restrictive dzs. No date: Coronary artery disease     Comment:  a. PCI of LCX 2003; b. PCI of the LAD 2012 with a (2.5 x              8 mm BMS);  c.s/p CABG 4/12: L-LAD, VG-Dx, VG-OM, VG-RCA               (Dr. Prescott Gum);  d. 01/2012 MV: inf infarct, attenuation,              no ischemia; e. 02/2017 MV: signifi attenuation artifact.               Fixed basal antlat/inflat scar vs artifact. Reversible               apical lat and mid antlat defect - ? atten vs ischemia.               F/u echo w/o wma->Med rx. No date: Depression No  date: Diabetes mellitus without complication ([place]) No date: Family history of colon cancer No date: Generalized headaches     Comment:  frequent No date: GERD (gastroesophageal reflux disease) No date: Headache No date: Heart murmur No date: Hematochezia No date: Hepatitis     Comment:  history of hepatitis b No date: History of cervical cancer     Comment:  s/p cryotherapy No date: History of drug abuse ([place])     Comment:  cocaine, marijuana, clean since 1989 No date: History of hepatitis B     Comment:  from eating undercooked liver No date: History of MI (myocardial infarction) No date: Hyperlipidemia No date: Hypertension No date: Ischemic cardiomyopathy     Comment:  a. 03/2017 Echo: EF 40-45%, Gr1 DD. 2003, 2012: Myocardial infarction ([place]) No date: PAD (peripheral artery disease) ([place])     Comment:  a. s/p Right SFA atherectomy and PTA 01/15/11; b. 07/2018               ABI: R 1.02, L 1.09. No date: Pituitary mass [facility])     Comment:  a. 12/2018 MRI Brain: Stable pituitary mass w/ 68mm area               of necrosis.  Mass abuts R cavernous sinus w/o definite               invasion. No date: Polyp of colon No date: Seasonal allergies No date: Sleep apnea     Comment:  uses cpap No date: Smoking history     Comment:  quit 07/2010 No date: Thyroid disease No date: Urinary incontinence Past Surgical History: No date: ABDOMINAL HYSTERECTOMY     Comment:  2003 1986: CHOLECYSTECTOMY 2008: COLONOSCOPY     Comment:  3 polyps 02/06/2015: COLONOSCOPY WITH PROPOFOL; N/A     Comment:  Procedure: COLONOSCOPY WITH PROPOFOL;  Surgeon: Lucilla Lame, MD;  Location: [facility] ENDOSCOPY;  Service: Endoscopy;              Laterality: N/A; 01/27/2017: COLONOSCOPY WITH PROPOFOL; N/A     Comment:  Procedure: COLONOSCOPY WITH PROPOFOL;  Surgeon: Lucilla Lame, MD;  Location: [facility] ENDOSCOPY;  Service:               Endoscopy;  Laterality: N/A; No date: CORONARY ANGIOPLASTY     Comment:  w/ stent placement x2 2012: CORONARY ARTERY BYPASS GRAFT     Comment:  Dr [name] 2003: CORONARY STENT PLACEMENT     Comment:  S/P MI 2007: CORONARY STENT PLACEMENT     Comment:  [place] 02/06/2015: ESOPHAGOGASTRODUODENOSCOPY (EGD) WITH PROPOFOL; N/A     Comment:  Procedure: ESOPHAGOGASTRODUODENOSCOPY (EGD) WITH               PROPOFOL;  Surgeon: Lucilla Lame, MD;  Location: [facility]               ENDOSCOPY;  Service: Endoscopy;  Laterality: N/A; 01/27/2017: ESOPHAGOGASTRODUODENOSCOPY (EGD) WITH PROPOFOL; N/A     Comment:  Procedure: ESOPHAGOGASTRODUODENOSCOPY (EGD) WITH               PROPOFOL;  Surgeon: Lucilla Lame, MD;  Location: [facility]               ENDOSCOPY;  Service: Endoscopy;  Laterality: N/A; 10/2010: FEMORAL ARTERY STENT     Comment:  right  sided (Dr. Burt Knack) 08/20/2017: KNEE ARTHROSCOPY WITH MEDIAL MENISECTOMY; Right     Comment:  Procedure: KNEE ARTHROSCOPY WITH MEDIAL  AND LATERAL               MENISECTOMY;  Surgeon: Hessie Knows, MD;  Location: [facility]              ORS;  Service: Orthopedics;  Laterality:  Right; 03/08/2018: POSTERIOR CERVICAL LAMINECTOMY; N/A     Comment:  Procedure: POSTERIOR CERVICAL LAMINECTOMY-C7;  Surgeon:               Deetta Perla, MD;  Location: [facility] ORS;  Service:               Neurosurgery;  Laterality: N/A; No date: TONSILLECTOMY 04/14/2019: TOTAL KNEE ARTHROPLASTY; Right     Comment:  Procedure: RIGHT TOTAL KNEE ARTHROPLASTY;  Surgeon:               Hessie Knows, MD;  Location: [facility] ORS;  Service:               Orthopedics;  Laterality: Right; No date: TUBAL LIGATION   Reproductive/Obstetrics negative OB ROS                             Anesthesia Physical Anesthesia Plan  ASA: IV  Anesthesia Plan: General   Post-op Pain Management:    Induction:   PONV Risk Score and Plan:   Airway Management Planned:   Additional Equipment:   Intra-op Plan:   Post-operative Plan:   Informed Consent: I have reviewed the patients History and Physical, chart, labs and discussed the procedure including the risks, benefits and alternatives for the proposed anesthesia with the patient or authorized representative who has indicated his/her understanding and acceptance.     Dental Advisory Given  Plan Discussed with: CRNA  Anesthesia Plan Comments:         Anesthesia Quick Evaluation

## 2019-09-01 NOTE — Transfer of Care (Signed)
Immediate Anesthesia Transfer of Care Note  Patient: [name]  Procedure(s) Performed: ESOPHAGOGASTRODUODENOSCOPY (EGD) WITH PROPOFOL (N/A )  Patient Location: PACU  Anesthesia Type:General  Level of Consciousness: awake, alert  and oriented  Airway & Oxygen Therapy: Patient Spontanous Breathing  Post-op Assessment: Report given to RN and Post -op Vital signs reviewed and stable  Post vital signs: Reviewed and stable  Last Vitals:  Vitals Value Taken Time  BP 97/61 09/01/19 0913  Temp    Pulse 74 09/01/19 0913  Resp 13 09/01/19 0913  SpO2 100 % 09/01/19 0913  Vitals shown include unvalidated device data.  Last Pain:  Vitals:   09/01/19 0812  TempSrc: Temporal         Complications: No apparent anesthesia complications

## 2019-09-01 NOTE — Op Note (Signed)
[facility] Gastroenterology Patient Name: [name] Procedure Date: 09/01/2019 7:52 AM MRN: [id number] Account #: [id number] Date of Birth: 1960-12-08 Admit Type: Outpatient Age: 59 Room: [facility] ENDO ROOM 3 Gender: Female Note Status: Finalized Procedure:             Upper GI endoscopy Indications:           Follow-up of gastric ulcer Providers:             Lin Landsman MD, MD Referring MD:          [name] [name] MD, MD (Referring MD) Medicines:             Monitored Anesthesia Care Complications:         No immediate complications. Estimated blood loss: None. Procedure:             Pre-Anesthesia Assessment:                        - Prior to the procedure, a History and Physical was                         performed, and patient medications and allergies were                         reviewed. The patient is competent. The risks and                         benefits of the procedure and the sedation options and                         risks were discussed with the patient. All questions                         were answered and informed consent was obtained.                         Patient identification and proposed procedure were                         verified by the physician, the nurse, the                         anesthesiologist, the anesthetist and the technician                         in the pre-procedure area in the procedure room in the                         endoscopy suite. Mental Status Examination: alert and                         oriented. Airway Examination: normal oropharyngeal                         airway and neck mobility. Respiratory Examination:                         clear to auscultation. CV Examination: normal.  Prophylactic Antibiotics: The patient does not require                         prophylactic antibiotics. Prior Anticoagulants: The                         patient has taken Xarelto  (rivaroxaban), last dose was                         2 days prior to procedure. ASA Grade Assessment: IV -                         A patient with severe systemic disease that is a                         constant threat to life. After reviewing the risks and                         benefits, the patient was deemed in satisfactory                         condition to undergo the procedure. The anesthesia                         plan was to use monitored anesthesia care (MAC).                         Immediately prior to administration of medications,                         the patient was re-assessed for adequacy to receive                         sedatives. The heart rate, respiratory rate, oxygen                         saturations, blood pressure, adequacy of pulmonary                         ventilation, and response to care were monitored                         throughout the procedure. The physical status of the                         patient was re-assessed after the procedure.                        After obtaining informed consent, the endoscope was                         passed under direct vision. Throughout the procedure,                         the patient's blood pressure, pulse, and oxygen                         saturations were monitored continuously. The  Endoscope                         was introduced through the mouth, and advanced to the                         second part of duodenum. The upper GI endoscopy was                         accomplished without difficulty. The patient tolerated                         the procedure well. Findings:      The duodenal bulb and second portion of the duodenum were normal.      A small hiatal hernia was present.      The entire examined stomach was normal.      The cardia and gastric fundus were normal on retroflexion.      The gastroesophageal junction and examined esophagus were normal. Impression:            - Normal  duodenal bulb and second portion of the                         duodenum.                        - Small hiatal hernia.                        - Normal stomach.                        - Normal gastroesophageal junction and esophagus.                        - No specimens collected. Recommendation:        - Discharge patient to home (with escort).                        - Resume previous diet today.                        - Continue present medications.                        - Resume Xarelto (rivaroxaban) at prior dose today.                         Refer to managing physician for further adjustment of                         therapy. Procedure Code(s):     --- Professional ---                        [phone], Esophagogastroduodenoscopy, flexible,                         transoral; diagnostic, including collection of                         specimen(s) by brushing or washing, when performed                         (  separate procedure) Diagnosis Code(s):     --- Professional ---                        K44.9, Diaphragmatic hernia without obstruction or                         gangrene                        K25.9, Gastric ulcer, unspecified as acute or chronic,                         without hemorrhage or perforation CPT copyright 2019 American Medical Association. All rights reserved. The codes documented in this report are preliminary and upon coder review may  be revised to meet current compliance requirements. Dr. Ulyess Mort Lin Landsman MD, MD 09/01/2019 9:10:06 AM This report has been signed electronically. Number of Addenda: 0 Note Initiated On: 09/01/2019 7:52 AM Estimated Blood Loss:  Estimated blood loss: none.      [facility]

## 2019-09-01 NOTE — Telephone Encounter (Signed)
Patient stopped by the office to pick up samples of a medication to help her stay regular.  Contacted Dr. Marius Ditch to get clarification.  I was advised by Dr. Marius Ditch to provide patient with 2 weeks samples of Linzess 171mcg take 1 a day.  Patient was provided samples of Linzess 159mcg advised to take 1 daily.  2 week supply given.  Thanks,  [place], [place]

## 2019-09-03 NOTE — Anesthesia Postprocedure Evaluation (Signed)
Anesthesia Post Note  Patient: [name]  Procedure(s) Performed: ESOPHAGOGASTRODUODENOSCOPY (EGD) WITH PROPOFOL (N/A )  Patient location during evaluation: PACU Anesthesia Type: General Level of consciousness: awake and alert Pain management: pain level controlled Vital Signs Assessment: post-procedure vital signs reviewed and stable Respiratory status: spontaneous breathing, nonlabored ventilation, respiratory function stable and patient connected to nasal cannula oxygen Cardiovascular status: blood pressure returned to baseline and stable Postop Assessment: no apparent nausea or vomiting Anesthetic complications: no     Last Vitals:  Vitals:   09/01/19 0913 09/01/19 0923  BP: (!) 61/51 113/70  Pulse: 75 74  Resp: 10 19  Temp: 36.4 C   SpO2: 100% 99%    Last Pain:  Vitals:   09/01/19 0913  TempSrc: Tympanic                 Molli Barrows

## 2019-09-05 ENCOUNTER — Telehealth: Payer: Self-pay

## 2019-09-05 NOTE — Telephone Encounter (Signed)
Error

## 2019-09-07 ENCOUNTER — Encounter: Payer: Self-pay | Admitting: Gastroenterology

## 2019-09-07 ENCOUNTER — Ambulatory Visit (INDEPENDENT_AMBULATORY_CARE_PROVIDER_SITE_OTHER): Payer: Medicare HMO | Admitting: Gastroenterology

## 2019-09-07 DIAGNOSIS — R1032 Left lower quadrant pain: Secondary | ICD-10-CM | POA: Diagnosis not present

## 2019-09-07 DIAGNOSIS — K5730 Diverticulosis of large intestine without perforation or abscess without bleeding: Secondary | ICD-10-CM

## 2019-09-07 DIAGNOSIS — K5909 Other constipation: Secondary | ICD-10-CM

## 2019-09-07 MED ORDER — LINACLOTIDE 145 MCG PO CAPS: 145.0000 ug | ORAL_CAPSULE | Freq: Every day | ORAL | 2 refills | Status: DC

## 2019-09-07 NOTE — Progress Notes (Signed)
Sherri Sear, MD [address]  [place]  [place], [place] [address]  Main: [phone]  Fax: [phone]    Gastroenterology Consultation Tele Visit  Referring Provider:     [name], Olivia Mackie * Primary Care Physician:  [name], [name], MD Primary Gastroenterologist:  Dr. Sherri Sear Reason for Consultation:     Left lower quadrant pain, chronic constipation        HPI:   [name] is a 59 y.o. female referred by Dr. Terese Door, [name], MD  for consultation & management of left lower quadrant pain, chronic constipation  Virtual Visit via Telephone Note  I connected with [name] on 09/07/19 at  9:00 AM EST by telephone and verified that I am speaking with the correct person using two identifiers.   I discussed the limitations, risks, security and privacy concerns of performing an evaluation and management service by telephone and the availability of in person appointments. I also discussed with the patient that there may be a patient responsible charge related to this service. The patient expressed understanding and agreed to proceed.  Location of the Patient: Home  Location of the provider: Office  Persons participating in the visit: Patient and provider only  History of Present Illness: I initially saw [name] on 08/25/2019 due to severe left lower quadrant pain, severe constipation and she was empirically being treated for acute sigmoid diverticulitis.  Repeat CT scan did not reveal diverticulitis.  Upper endoscopy was unremarkable as well.  I treated her constipation with GoLYTELY followed by Linzess 145 MCG and high-fiber diet with fiber supplements.  She reports that she is responding very well to Linzess and fiber supplements.  She is taking Linzess every day and her bowel movements are now formed.  She reports that her left lower quadrant pain is gradually improving.  She is very pleased with the way her symptoms are improving.   She drinks sodas daily and eats red meat regularly.   She does not smoke or drink alcohol  NSAIDs: None  Antiplts/Anticoagulants/Anti thrombotics: None  GI Procedures:  EGD 09/01/2019 - Normal duodenal bulb and second portion of the duodenum. - Small hiatal hernia. - Normal stomach. - Normal gastroesophageal junction and esophagus. - No specimens collected.  Antiplts/Anticoagulants/Anti thrombotics: None  EGD and colonoscopy in 2018 by Dr. Allen Norris - Small hiatal hernia. - Non-bleeding gastric ulcers with no stigmata of bleeding. Biopsied. - Gastritis. Biopsied. - Normal examined duodenum.  - Two 2 to 4 mm polyps in the sigmoid colon, removed with a cold snare. Resected and retrieved. - One 4 mm polyp in the ascending colon, removed with a cold snare. Resected and retrieved. - Diverticulosis in the sigmoid colon. - Non-bleeding internal hemorrhoids.  DIAGNOSIS:  A. STOMACH ULCERATION; COLD BIOPSY:  - MILD ACTIVE GASTRITIS WITH ULCERATION.  - NEGATIVE FOR DYSPLASIA AND MALIGNANCY.   ADDENDUM:  Immunohistochemical stain for H. pylori is negative with an appropriate  control.   B. COLON POLYP, ASCENDING; COLD SNARE:  - TUBULAR ADENOMA.  - NEGATIVE FOR HIGH-GRADE DYSPLASIA AND MALIGNANCY.   C. COLON POLYP 2, SIGMOID; COLD SNARE:  - TUBULAR ADENOMAS (2).  - NEGATIVE FOR HIGH-GRADE DYSPLASIA AND MALIGNANCY.   EGD and colonoscopy in 2016 by Dr. Allen Norris  DIAGNOSIS:  A. COLON POLYP, ASCENDING; HOT SNARE:  - TUBULAR ADENOMA, 1.0 CM FRAGMENT AND SEVERAL SMALLER FRAGMENTS.  - NEGATIVE FOR HIGH-GRADE DYSPLASIA AND MALIGNANCY.   Past Medical History:  Diagnosis Date  . Allergy   .  Anemia   . Arthritis   . Asthma   . Bacterial vaginitis   . Blood in stool   . Brachial neuritis or radiculitis NOS   . 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 being followed by neurosurgery/eye doctor and pcp. monitoring size.causes sinus problems  . Bronchitis   . Cardiac  arrhythmia   . Cervical neck pain with evidence of disc disease    C5/6 disease, MRI done late 2012 - no records available  . Chronic combined systolic and diastolic CHF (congestive heart failure) ([place])    a. 08/2010 Echo: mildly reduced EF 40-45%, mild diffuse hypokinesis; b. 06/2016 Echo: EF 50%, no rwma, mild to mod TR; c. 03/2017 Echo: EF 40-45%, Gr1 DD (prior echo reviewed and EF felt to be lower than reported).  . Chronic pain   . Cocaine abuse, in remission ([place])    clean x 24 years  . Colon polyps   . COPD (chronic obstructive pulmonary disease) ([place])    a. 12/2018 PFT: No obvious obst/restrictive dzs.  . Coronary artery disease    a. PCI of LCX 2003; b. PCI of the LAD 2012 with a (2.5 x 8 mm BMS);  c.s/p CABG 4/12: L-LAD, VG-Dx, VG-OM, VG-RCA (Dr. Prescott Gum);  d. 01/2012 MV: inf infarct, attenuation, no ischemia; e. 02/2017 MV: signifi attenuation artifact. Fixed basal antlat/inflat scar vs artifact. Reversible apical lat and mid antlat defect - ? atten vs ischemia. F/u echo w/o wma->Med rx.  . Depression   . Diabetes mellitus without complication ([place])   . Family history of colon cancer   . Generalized headaches    frequent  . GERD (gastroesophageal reflux disease)   . Headache   . Heart murmur   . Hematochezia   . Hepatitis    history of hepatitis b  . History of cervical cancer    s/p cryotherapy  . History of drug abuse ([place])    cocaine, marijuana, clean since 1989  . History of hepatitis B    from eating undercooked liver  . History of MI (myocardial infarction)   . Hyperlipidemia   . Hypertension   . Ischemic cardiomyopathy    a. 03/2017 Echo: EF 40-45%, Gr1 DD.  [name] Myocardial infarction [facility]) 2003, 2012  . PAD (peripheral artery disease) ([place])    a. s/p Right SFA atherectomy and PTA 01/15/11; b. 07/2018 ABI: R 1.02, L 1.09.  [name] Pituitary mass ([place])    a. 12/2018 MRI Brain: Stable pituitary mass w/ 42mm area of necrosis. Mass abuts R cavernous sinus w/o definite invasion.    . Polyp of colon   . Seasonal allergies   . Sleep apnea    uses cpap  . Smoking history    quit 07/2010  . Thyroid disease   . Urinary incontinence     Past Surgical History:  Procedure Laterality Date  . ABDOMINAL HYSTERECTOMY     2003  . CHOLECYSTECTOMY  1986  . COLONOSCOPY  2008   3 polyps  . COLONOSCOPY WITH PROPOFOL N/A 02/06/2015   Procedure: COLONOSCOPY WITH PROPOFOL;  Surgeon: Lucilla Lame, MD;  Location: [facility] ENDOSCOPY;  Service: Endoscopy;  Laterality: N/A;  . COLONOSCOPY WITH PROPOFOL N/A 01/27/2017   Procedure: COLONOSCOPY WITH PROPOFOL;  Surgeon: Lucilla Lame, MD;  Location: [facility] ENDOSCOPY;  Service: Endoscopy;  Laterality: N/A;  . CORONARY ANGIOPLASTY     w/ stent placement x2  . CORONARY ARTERY BYPASS GRAFT  2012   Dr [name]  . CORONARY  STENT PLACEMENT  2003   S/P MI  . CORONARY STENT PLACEMENT  2007   [place]  . ESOPHAGOGASTRODUODENOSCOPY (EGD) WITH PROPOFOL N/A 02/06/2015   Procedure: ESOPHAGOGASTRODUODENOSCOPY (EGD) WITH PROPOFOL;  Surgeon: Lucilla Lame, MD;  Location: [facility] ENDOSCOPY;  Service: Endoscopy;  Laterality: N/A;  . ESOPHAGOGASTRODUODENOSCOPY (EGD) WITH PROPOFOL N/A 01/27/2017   Procedure: ESOPHAGOGASTRODUODENOSCOPY (EGD) WITH PROPOFOL;  Surgeon: Lucilla Lame, MD;  Location: [facility] ENDOSCOPY;  Service: Endoscopy;  Laterality: N/A;  . ESOPHAGOGASTRODUODENOSCOPY (EGD) WITH PROPOFOL N/A 09/01/2019   Procedure: ESOPHAGOGASTRODUODENOSCOPY (EGD) WITH PROPOFOL;  Surgeon: Lin Landsman, MD;  Location: [facility] ENDOSCOPY;  Service: Gastroenterology;  Laterality: N/A;  . FEMORAL ARTERY STENT  10/2010   right sided (Dr. Burt Knack)  . KNEE ARTHROSCOPY WITH MEDIAL MENISECTOMY Right 08/20/2017   Procedure: KNEE ARTHROSCOPY WITH MEDIAL  AND LATERAL MENISECTOMY;  Surgeon: Hessie Knows, MD;  Location: [facility] ORS;  Service: Orthopedics;  Laterality: Right;  . POSTERIOR CERVICAL LAMINECTOMY N/A 03/08/2018   Procedure: POSTERIOR CERVICAL LAMINECTOMY-C7;  Surgeon: Deetta Perla, MD;   Location: [facility] ORS;  Service: Neurosurgery;  Laterality: N/A;  . TONSILLECTOMY    . TOTAL KNEE ARTHROPLASTY Right 04/14/2019   Procedure: RIGHT TOTAL KNEE ARTHROPLASTY;  Surgeon: Hessie Knows, MD;  Location: [facility] ORS;  Service: Orthopedics;  Laterality: Right;  . TUBAL LIGATION     Current Outpatient Medications:  .  albuterol (VENTOLIN HFA) 108 (90 Base) MCG/ACT inhaler, Inhale 2 puffs into the lungs every 6 (six) hours as needed for wheezing or shortness of breath., Disp: 1 g, Rfl: 12 .  aspirin EC 81 MG tablet, Take 1 tablet (81 mg total) by mouth daily., Disp: , Rfl:  .  Azelastine HCl 0.15 % SOLN, Place 2 sprays into both nostrils daily as needed (allergies). , Disp: , Rfl:  .  esomeprazole (NEXIUM) 40 MG capsule, Take 1 capsule (40 mg total) by mouth daily. Reported on 09/04/2015, Disp: 90 capsule, Rfl: 3 .  Evolocumab (REPATHA SURECLICK) [id number] MG/ML SOAJ, Inject 1 pen into the skin every 14 (fourteen) days., Disp: 2 pen, Rfl: 10 .  ezetimibe (ZETIA) 10 MG tablet, Take 1 tablet (10 mg total) by mouth daily., Disp: 90 tablet, Rfl: 3 .  fluticasone (FLONASE) 50 MCG/ACT nasal spray, Place 2 sprays into both nostrils daily as needed for allergies. , Disp: , Rfl:  .  fluticasone (FLOVENT HFA) 110 MCG/ACT inhaler, Inhale 1 puff into the lungs 2 (two) times a day. Rinse mouth, Disp: 1 Inhaler, Rfl: 12 .  furosemide (LASIX) 20 MG tablet, TAKE 1 TABLET BY MOUTH ONCE DAILY AND A SECOND TABLET IF NEEDED FOR FLUID BUILD UP., Disp: 180 tablet, Rfl: 0 .  glucose blood (ONE TOUCH ULTRA TEST) test strip, Use as instructed check cbg qd E11.9, Disp: 100 each, Rfl: 12 .  hydrOXYzine (ATARAX/VISTARIL) 25 MG tablet, Take 1-2 tablets (25-50 mg total) by mouth daily as needed., Disp: 60 tablet, Rfl: 2 .  ipratropium-albuterol (DUONEB) 0.5-2.5 (3) MG/3ML SOLN, Take 3 mLs by nebulization 2 (two) times daily as needed. (Patient taking differently: Take 3 mLs by nebulization 2 (two) times daily as needed  (wheezing/shortness of breath). ), Disp: 360 mL, Rfl: 12 .  isosorbide mononitrate (IMDUR) 30 MG 24 hr tablet, Take 1 tablet (30 mg total) by mouth 2 (two) times daily., Disp: 180 tablet, Rfl: 1 .  Lactobacillus (ACIDOPHILUS PROBIOTIC) 10 MG CAPS, Take 1 capsule by mouth daily., Disp: 30 capsule, Rfl: 2 .  Lancets MISC, 1 Device by Does not apply route daily.  Lancets E11.9, Disp: 90 each, Rfl: 3 .  levocetirizine (XYZAL) 5 MG tablet, Take 1 tablet (5 mg total) by mouth at bedtime as needed for allergies., Disp: 90 tablet, Rfl: 3 .  linaclotide (LINZESS) 145 MCG CAPS capsule, Take 1 capsule (145 mcg total) by mouth daily before breakfast., Disp: 30 capsule, Rfl: 2 .  meclizine (ANTIVERT) 25 MG tablet, Take 25 mg by mouth 3 (three) times daily. , Disp: , Rfl: 3 .  metFORMIN (GLUCOPHAGE) 500 MG tablet, Take 1 tablet (500 mg total) by mouth daily with breakfast., Disp: 90 tablet, Rfl: 3 .  methocarbamol (ROBAXIN) 500 MG tablet, Take 1 tablet (500 mg total) by mouth every 6 (six) hours as needed for muscle spasms., Disp: 30 tablet, Rfl: 0 .  metoprolol tartrate (LOPRESSOR) 25 MG tablet, Take 1 tablet (25 mg total) by mouth 2 (two) times daily., Disp: 180 tablet, Rfl: 3 .  mirabegron ER (MYRBETRIQ) 25 MG TB24 tablet, Take 1 tablet (25 mg total) by mouth daily. (Patient taking differently: Take 25 mg by mouth daily as needed (urinary frequency). ), Disp: 30 tablet, Rfl: 11 .  mupirocin ointment (BACTROBAN) 2 %, Apply 1 application topically 2 (two) times daily. Prn scalp x 1 week and nose x 5 days, Disp: 30 g, Rfl: 0 .  nitroGLYCERIN (NITROSTAT) 0.4 MG SL tablet, Place 1 tablet (0.4 mg total) under the tongue every 5 (five) minutes as needed., Disp: 25 tablet, Rfl: 6 .  ondansetron (ZOFRAN) 4 MG tablet, Take 1 tablet (4 mg total) by mouth every 8 (eight) hours as needed for nausea or vomiting., Disp: 20 tablet, Rfl: 0 .  PARoxetine (PAXIL) 20 MG tablet, Take 1 tablet (20 mg total) by mouth daily., Disp: 90  tablet, Rfl: 3 .  potassium chloride (KLOR-CON) 10 MEQ tablet, Take 1 tablet by mouth once daily, Disp: 90 tablet, Rfl: 0 .  rivaroxaban (XARELTO) 2.5 MG TABS tablet, Take 1 tablet (2.5 mg total) by mouth 2 (two) times daily., Disp: 60 tablet, Rfl: 5 .  rosuvastatin (CRESTOR) 20 MG tablet, Take 1 tablet (20 mg total) by mouth at bedtime., Disp: 90 tablet, Rfl: 3 .  umeclidinium-vilanterol (ANORO ELLIPTA) 62.5-25 MCG/INH AEPB, Inhale 1 puff into the lungs daily., Disp: 60 each, Rfl: 12 .  dicyclomine (BENTYL) 10 MG capsule, Take 1 capsule (10 mg total) by mouth 3 (three) times daily before meals. As needed for abdominal pain (Patient not taking: Reported on 09/07/2019), Disp: 120 capsule, Rfl: 0 .  losartan (COZAAR) 25 MG tablet, Take 1 tablet (25 mg total) by mouth daily. (Patient taking differently: Take 25 mg by mouth every evening. ), Disp: 90 tablet, Rfl: 3 .  metroNIDAZOLE (FLAGYL) 500 MG tablet, Take 1 tablet (500 mg total) by mouth 2 (two) times daily. With food (Patient not taking: Reported on 09/07/2019), Disp: 14 tablet, Rfl: 0    Family History  Problem Relation Age of Onset  . Hypertension Father   . Heart failure Father   . Diabetes Father   . Colon cancer Father 55  . Glaucoma Father   . Cancer Father        colorectal   . Heart disease Father   . Other Father        glaucoma  . Breast cancer Mother 54       breast cancer, late 80's  . Cancer Mother        breast  . Brain cancer Sister   . Arthritis Sister   .  Diabetes Sister   . Hypertension Sister   . Kidney disease Sister   . Diabetes Brother   . Hypertension Brother   . Other Sister        brain tumor   . Acute myelogenous leukemia Grandson        06/2019  . Coronary artery disease Neg Hx   . Stroke Neg Hx      Social History   Tobacco Use  . Smoking status: Former Smoker    Packs/day: 1.00    Years: 38.00    Pack years: 38.00    Types: Cigarettes    Quit date: 08/12/2010    Years since quitting: 9.0    . Smokeless tobacco: Never Used  Substance Use Topics  . Alcohol use: No  . Drug use: No    Types: Cocaine    Comment: Remote Hx (crack cocaine and marijuana).none since 7yrs plus    Allergies as of 09/07/2019 - Review Complete 09/07/2019  Allergen Reaction Noted  . Latex Rash 08/23/2010  . Shellfish allergy Anaphylaxis   . Sulfonamide derivatives Anaphylaxis 08/23/2010  . Watermelon [citrullus vulgaris] Other (See Comments) 04/13/2017  . Soy allergy  03/10/2019  . Other Swelling 01/20/2012     Imaging Studies: Reviewed  Assessment and Plan:   Jonai Ristine is a 59 y.o. female with metabolic syndrome, chronic constipation, history of gastric ulcers, H. pylori negative, chronic constipation and left-sided abdominal pain.  I have contacted her for follow-up of chronic constipation, history of peptic ulcer and left-sided abdominal pain.  Her symptoms are progressively improving  EGD unremarkable for peptic ulcer  Chronic constipation and left lower quadrant pain History of sigmoid diverticulosis Reiterated on importance of high-fiber diet, adequate intake of water and fiber supplements as needed Continue Linzess 145 MCG daily, prescription sent Discussed about fiber supplements different choices like Fiberchoice, Metamucil etc. Eliminate carbonated beverages, cut back on regular intake of red meat and patient agreed to try   Follow Up Instructions:   I discussed the assessment and treatment plan with the patient. The patient was provided an opportunity to ask questions and all were answered. The patient agreed with the plan and demonstrated an understanding of the instructions.   The patient was advised to call back or seek an in-person evaluation if the symptoms worsen or if the condition fails to improve as anticipated.  I provided 15 minutes of non-face-to-face time during this encounter.   Follow up in 2 months   Cephas Darby, MD

## 2019-09-09 ENCOUNTER — Other Ambulatory Visit: Payer: Self-pay

## 2019-09-09 ENCOUNTER — Other Ambulatory Visit (INDEPENDENT_AMBULATORY_CARE_PROVIDER_SITE_OTHER): Payer: Medicare HMO

## 2019-09-09 DIAGNOSIS — E119 Type 2 diabetes mellitus without complications: Secondary | ICD-10-CM | POA: Diagnosis not present

## 2019-09-09 DIAGNOSIS — D649 Anemia, unspecified: Secondary | ICD-10-CM

## 2019-09-09 DIAGNOSIS — I10 Essential (primary) hypertension: Secondary | ICD-10-CM

## 2019-09-09 LAB — LIPID PANEL
Cholesterol: 98 mg/dL (ref 0–200)
HDL: 56.4 mg/dL (ref >39.00)
LDL Cholesterol: 27 mg/dL (ref 0–99)
NonHDL: 41.14
Total CHOL/HDL Ratio: 2
Triglycerides: 72 mg/dL (ref 0.0–149.0)
VLDL: 14.4 mg/dL (ref 0.0–40.0)

## 2019-09-09 LAB — HEMOGLOBIN A1C: Hgb A1c MFr Bld: 6.7 % — ABNORMAL HIGH (ref 4.6–6.5)

## 2019-09-10 LAB — MICROALBUMIN / CREATININE URINE RATIO
Creatinine, Urine: 9 mg/dL — ABNORMAL LOW (ref 20–275)
Microalb, Ur: <0.2 mg/dL

## 2019-09-10 LAB — IRON,TIBC AND FERRITIN PANEL
%SAT: 17 % (calc) (ref 16–45)
Ferritin: 18 ng/mL (ref 16–288)
Iron: 62 ug/dL (ref 45–160)
TIBC: 357 mcg/dL (calc) (ref 250–450)

## 2019-09-12 ENCOUNTER — Telehealth: Payer: Self-pay | Admitting: Gastroenterology

## 2019-09-12 NOTE — Telephone Encounter (Signed)
Pt left vm she states she is supposed to be picking up samples and a high fiber diet she would like to come on Monday please call pt to verify we have received her message

## 2019-09-13 ENCOUNTER — Other Ambulatory Visit: Payer: Self-pay | Admitting: Internal Medicine

## 2019-09-13 DIAGNOSIS — Z1231 Encounter for screening mammogram for malignant neoplasm of breast: Secondary | ICD-10-CM

## 2019-09-13 NOTE — Telephone Encounter (Signed)
Patient will stop by today to pick up samples at front desk.  Thanks,  [place], [place]

## 2019-09-18 DIAGNOSIS — I259 Chronic ischemic heart disease, unspecified: Secondary | ICD-10-CM | POA: Diagnosis not present

## 2019-09-18 DIAGNOSIS — G4710 Hypersomnia, unspecified: Secondary | ICD-10-CM | POA: Diagnosis not present

## 2019-09-18 DIAGNOSIS — G4733 Obstructive sleep apnea (adult) (pediatric): Secondary | ICD-10-CM | POA: Diagnosis not present

## 2019-09-21 DIAGNOSIS — G4733 Obstructive sleep apnea (adult) (pediatric): Secondary | ICD-10-CM | POA: Diagnosis not present

## 2019-10-06 ENCOUNTER — Telehealth: Payer: Self-pay | Admitting: Internal Medicine

## 2019-10-06 NOTE — Telephone Encounter (Signed)
Pt has itchy/watery eyes-allergy symptoms. Wants advise on what to take.

## 2019-10-06 NOTE — Telephone Encounter (Signed)
Please advise 

## 2019-10-09 NOTE — Progress Notes (Signed)
Cardiology Office Note  Date:  10/10/2019   ID:  Daneja Flammia, DOB 29-Jan-1961, MRN [id number]  PCP:  [name], [name], MD   Chief Complaint  Patient presents with  . other    6 month follow up. Meds reviewed by the pt. verbally. "doing well."     HPI:  59 yo woman with a  long smoking history,   CAD s/p  NSTEMI at [facility]  1/12, with BMS to pLAD (80% stenosis),  residual severe stenosis of the RCA/nondominant small vessel,  continued chest pain,  cardiac catheterization showing distal left main stenosis involving a high grade ostial LAD stenosis and circumflex disease approximately 90%, 90% diagonal disease, 80% nondominant RCA disease with ejection fraction 45%,  CABG x 4 in 2012 with a LIMA to the LAD, vein graft to the diagonal, vein graft to the marginal and vein graft to the RCA,   peripheral vascular disease,  S/p atherectomy and PTA of her right SFA on 01/15/11,   outpatient stress test  July 2013 , OSA who presents for routine followup of her coronary artery disease  On prior clinic visit was having chronic cough ACE inhibitor changed to losartan Cough has resolved  Stress at home, Grandson with AML, in hospital Long discussion concerning his marital, has been there for several months, very sick, not eating well, chronic nausea  Prior records reviewed Ejection fraction low 40 to 45% in September 2018 Taking Lasix daily  Denies any significant shortness of breath or chest pain on exertion concerning for angina  Lab work reviewed Total chol 98, LDL 27, on Repatha Hemoglobin A1c 6.7, eating some bread, trending upward CR 1.02  chronic low back pain Constipation issues, followed by GI, takes [place]  emergency room September 23, 2017 with low back pain radiating to left leg, Placed on steroid taper  Stress test 04/21/2017: Artifact, grossly no large regions of ischemia  EKG personally reviewed by myself on todays visit Shows normal sinus rhythm with rate  72 bpm T-wave abnormality precordial leads, no change from prior EKG  Other past medical history Prior  outpatient stress test  July 2013 showed significant breast attenuation artifact, inferior wall artifact from GI uptake. Overall no significant ischemia. History of chronic left arm and upper left chest  pain and was seen pain clinic. Prior diagnosis of carpal tunnel syndrome  occasional left breast pain radiating to her mediastinum.    Previously seen by ear nose throat for vestibular testing as a cause of her dizziness.  EGD and colonoscopy which by her report showed hiatal hernia. Prior problems with swallowing  She has had MRI of the neck showing cervical disc disease  Lab work shows total cholesterol 151, LDL 79, HDL 52, hemoglobin A1c 6.1  Echocardiogram in the hospital showed normal LV systolic function ejection fraction 50-55%, mild MR, mild to moderate TR with right ventricular systolic pressures [id number]  PMH:   has a past medical history of Allergy, Anemia, Arthritis, Asthma, Bacterial vaginitis, Blood in stool, Brachial neuritis or radiculitis NOS, Brain tumor (benign) ([place]) (07/2017), Bronchitis, Cardiac arrhythmia, Cervical neck pain with evidence of disc disease, Chronic combined systolic and diastolic CHF (congestive heart failure) (HCC), Chronic pain, Cocaine abuse, in remission ([place]), Colon polyps, COPD (chronic obstructive pulmonary disease) ([place]), Coronary artery disease, Depression, Diabetes mellitus without complication ([place]), Family history of colon cancer, Generalized headaches, GERD (gastroesophageal reflux disease), Headache, Heart murmur, Hematochezia, Hepatitis, History of cervical cancer, History of drug abuse ([place]), History of hepatitis B, History  of MI (myocardial infarction), Hyperlipidemia, Hypertension, Ischemic cardiomyopathy, Myocardial infarction ([place]) (2003, 2012), PAD (peripheral artery disease) ([place]), 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  PSH:    Past Surgical History:  Procedure Laterality Date  . ABDOMINAL HYSTERECTOMY     2003  . CHOLECYSTECTOMY  1986  . COLONOSCOPY  2008   3 polyps  . COLONOSCOPY WITH PROPOFOL N/A 02/06/2015   Procedure: COLONOSCOPY WITH PROPOFOL;  Surgeon: Lucilla Lame, MD;  Location: [facility] ENDOSCOPY;  Service: Endoscopy;  Laterality: N/A;  . COLONOSCOPY WITH PROPOFOL N/A 01/27/2017   Procedure: COLONOSCOPY WITH PROPOFOL;  Surgeon: Lucilla Lame, MD;  Location: [facility] ENDOSCOPY;  Service: Endoscopy;  Laterality: N/A;  . CORONARY ANGIOPLASTY     w/ stent placement x2  . CORONARY ARTERY BYPASS GRAFT  2012   Dr [name]  . CORONARY STENT PLACEMENT  2003   S/P MI  . CORONARY STENT PLACEMENT  2007   [place]  . ESOPHAGOGASTRODUODENOSCOPY (EGD) WITH PROPOFOL N/A 02/06/2015   Procedure: ESOPHAGOGASTRODUODENOSCOPY (EGD) WITH PROPOFOL;  Surgeon: Lucilla Lame, MD;  Location: [facility] ENDOSCOPY;  Service: Endoscopy;  Laterality: N/A;  . ESOPHAGOGASTRODUODENOSCOPY (EGD) WITH PROPOFOL N/A 01/27/2017   Procedure: ESOPHAGOGASTRODUODENOSCOPY (EGD) WITH PROPOFOL;  Surgeon: Lucilla Lame, MD;  Location: [facility] ENDOSCOPY;  Service: Endoscopy;  Laterality: N/A;  . ESOPHAGOGASTRODUODENOSCOPY (EGD) WITH PROPOFOL N/A 09/01/2019   Procedure: ESOPHAGOGASTRODUODENOSCOPY (EGD) WITH PROPOFOL;  Surgeon: Lin Landsman, MD;  Location: [facility] ENDOSCOPY;  Service: Gastroenterology;  Laterality: N/A;  . FEMORAL ARTERY STENT  10/2010   right sided (Dr. Burt Knack)  . KNEE ARTHROSCOPY WITH MEDIAL MENISECTOMY Right 08/20/2017   Procedure: KNEE ARTHROSCOPY WITH MEDIAL  AND LATERAL MENISECTOMY;  Surgeon: Hessie Knows, MD;  Location: [facility] ORS;  Service: Orthopedics;  Laterality: Right;  . POSTERIOR CERVICAL LAMINECTOMY N/A 03/08/2018   Procedure: POSTERIOR CERVICAL LAMINECTOMY-C7;  Surgeon: Deetta Perla, MD;  Location: [facility] ORS;  Service: Neurosurgery;  Laterality: N/A;  . TONSILLECTOMY     . TOTAL KNEE ARTHROPLASTY Right 04/14/2019   Procedure: RIGHT TOTAL KNEE ARTHROPLASTY;  Surgeon: Hessie Knows, MD;  Location: [facility] ORS;  Service: Orthopedics;  Laterality: Right;  . TUBAL LIGATION      Current Outpatient Medications  Medication Sig Dispense Refill  . albuterol (VENTOLIN HFA) 108 (90 Base) MCG/ACT inhaler Inhale 2 puffs into the lungs every 6 (six) hours as needed for wheezing or shortness of breath. 1 g 12  . aspirin EC 81 MG tablet Take 1 tablet (81 mg total) by mouth daily.    . Azelastine HCl 0.15 % SOLN Place 2 sprays into both nostrils daily as needed (allergies).     [name] dicyclomine (BENTYL) 10 MG capsule Take 1 capsule (10 mg total) by mouth 3 (three) times daily before meals. As needed for abdominal pain 120 capsule 0  . esomeprazole (NEXIUM) 40 MG capsule Take 1 capsule (40 mg total) by mouth daily. Reported on 09/04/2015 90 capsule 3  . Evolocumab (REPATHA SURECLICK) [id number] MG/ML SOAJ Inject 1 pen into the skin every 14 (fourteen) days. 2 pen 10  . ezetimibe (ZETIA) 10 MG tablet Take 1 tablet (10 mg total) by mouth daily. 90 tablet 3  . fluticasone (FLONASE) 50 MCG/ACT nasal spray Place 2 sprays into both nostrils daily as needed for allergies.     . fluticasone (FLOVENT HFA) 110 MCG/ACT inhaler Inhale 1 puff into the lungs 2 (two) times a day. Rinse mouth 1 Inhaler 12  . furosemide (  LASIX) 20 MG tablet TAKE 1 TABLET BY MOUTH ONCE DAILY AND A SECOND TABLET IF NEEDED FOR FLUID BUILD UP. 180 tablet 0  . glucose blood (ONE TOUCH ULTRA TEST) test strip Use as instructed check cbg qd E11.9 100 each 12  . hydrOXYzine (ATARAX/VISTARIL) 25 MG tablet Take 1-2 tablets (25-50 mg total) by mouth daily as needed. 60 tablet 2  . ipratropium-albuterol (DUONEB) 0.5-2.5 (3) MG/3ML SOLN Take 3 mLs by nebulization 2 (two) times daily as needed. (Patient taking differently: Take 3 mLs by nebulization 2 (two) times daily as needed (wheezing/shortness of breath). ) 360 mL 12  . isosorbide  mononitrate (IMDUR) 30 MG 24 hr tablet Take 1 tablet (30 mg total) by mouth 2 (two) times daily. 180 tablet 1  . Lactobacillus (ACIDOPHILUS PROBIOTIC) 10 MG CAPS Take 1 capsule by mouth daily. 30 capsule 2  . Lancets MISC 1 Device by Does not apply route daily. Lancets E11.9 90 each 3  . levocetirizine (XYZAL) 5 MG tablet Take 1 tablet (5 mg total) by mouth at bedtime as needed for allergies. 90 tablet 3  . linaclotide (LINZESS) 145 MCG CAPS capsule Take 1 capsule (145 mcg total) by mouth daily before breakfast. 30 capsule 2  . losartan (COZAAR) 25 MG tablet Take 1 tablet (25 mg total) by mouth daily. (Patient taking differently: Take 25 mg by mouth every evening. ) 90 tablet 3  . meclizine (ANTIVERT) 25 MG tablet Take 25 mg by mouth 3 (three) times daily.   3  . metFORMIN (GLUCOPHAGE) 500 MG tablet Take 1 tablet (500 mg total) by mouth daily with breakfast. 90 tablet 3  . methocarbamol (ROBAXIN) 500 MG tablet Take 1 tablet (500 mg total) by mouth every 6 (six) hours as needed for muscle spasms. 30 tablet 0  . metoprolol tartrate (LOPRESSOR) 25 MG tablet Take 1 tablet (25 mg total) by mouth 2 (two) times daily. 180 tablet 3  . metroNIDAZOLE (FLAGYL) 500 MG tablet Take 1 tablet (500 mg total) by mouth 2 (two) times daily. With food 14 tablet 0  . mirabegron ER (MYRBETRIQ) 25 MG TB24 tablet Take 1 tablet (25 mg total) by mouth daily. (Patient taking differently: Take 25 mg by mouth daily as needed (urinary frequency). ) 30 tablet 11  . nitroGLYCERIN (NITROSTAT) 0.4 MG SL tablet Place 1 tablet (0.4 mg total) under the tongue every 5 (five) minutes as needed. 25 tablet 6  . ondansetron (ZOFRAN) 4 MG tablet Take 1 tablet (4 mg total) by mouth every 8 (eight) hours as needed for nausea or vomiting. 20 tablet 0  . PARoxetine (PAXIL) 20 MG tablet Take 1 tablet (20 mg total) by mouth daily. 90 tablet 3  . potassium chloride (KLOR-CON) 10 MEQ tablet Take 1 tablet by mouth once daily 90 tablet 0  . rivaroxaban  (XARELTO) 2.5 MG TABS tablet Take 1 tablet (2.5 mg total) by mouth 2 (two) times daily. 60 tablet 5  . rosuvastatin (CRESTOR) 20 MG tablet Take 1 tablet (20 mg total) by mouth at bedtime. 90 tablet 3  . umeclidinium-vilanterol (ANORO ELLIPTA) 62.5-25 MCG/INH AEPB Inhale 1 puff into the lungs daily. 60 each 12   No current facility-administered medications for this visit.    Allergies:   Latex, Shellfish allergy, Sulfonamide derivatives, Watermelon [citrullus vulgaris], Soy allergy, and Other   Social History:  The patient  reports that she quit smoking about 9 years ago. Her smoking use included cigarettes. She has a 38.00 pack-year smoking history. She  has never used smokeless tobacco. She reports that she does not drink alcohol or use drugs.   Family History:   family history includes Acute myelogenous leukemia in her grandson; Arthritis in her sister; Brain cancer in her sister; Breast cancer (age of onset: 58) in her mother; Cancer in her father and mother; Colon cancer (age of onset: 58) in her father; Diabetes in her brother, father, and sister; Glaucoma in her father; Heart disease in her father; Heart failure in her father; Hypertension in her brother, father, and sister; Kidney disease in her sister; Other in her father and sister.    Review of Systems: Review of Systems  Constitutional: Negative.   HENT: Negative.   Respiratory: Negative.   Gastrointestinal: Positive for constipation.  Musculoskeletal: Positive for back pain and joint pain.  Neurological: Negative.   Psychiatric/Behavioral: Negative.   All other systems reviewed and are negative.   PHYSICAL EXAM: VS:  BP 110/78 (BP Location: Left Arm, Patient Position: Sitting, Cuff Size: Normal)   Pulse 72   Ht 5\' 5"  (1.651 m)   Wt 198 lb 8 oz (90 kg)   BMI 33.03 kg/m  , BMI Body mass index is 33.03 kg/m. Constitutional:  oriented to person, place, and time. No distress.  HENT:  Head: Grossly normal Eyes:  no  discharge. No scleral icterus.  Neck: No JVD, no carotid bruits  Cardiovascular: Regular rate and rhythm, no murmurs appreciated Pulmonary/Chest: Clear to auscultation bilaterally, no wheezes or rails Abdominal: Soft.  no distension.  no tenderness.  Musculoskeletal: Normal range of motion Neurological:  normal muscle tone. Coordination normal. No atrophy Skin: Skin warm and dry Psychiatric: normal affect, pleasant  Recent Labs: 02/07/2019: TSH 1.03 08/25/2019: ALT 21; BUN 14; Creatinine, Ser 1.02; Hemoglobin 14.1; Platelets 205; Potassium 4.1; Sodium 142    Lipid Panel Lab Results  Component Value Date   CHOL 98 09/09/2019   HDL 56.40 09/09/2019   LDLCALC 27 09/09/2019   TRIG 72.0 09/09/2019      Wt Readings from Last 3 Encounters:  10/10/19 198 lb 8 oz (90 kg)  09/01/19 199 lb (90.3 kg)  08/25/19 199 lb (90.3 kg)     ASSESSMENT AND PLAN:  CAD/ CABG: With stable angina Continue aspirin with Xarelto 2.5 mg BID   has PAD , CABG Currently with no symptoms of angina. No further workup at this time. Continue current medication regimen.  Ischemic cardiomyopathy Continue current medications with the exception of: Recommend we change metoprolol tartrate to metoprolol succinate 50 mg daily Previously with cough on ACE inhibitor, continue losartan  Hyperlipidemia LDL at goal Continue current meds  Secondary hypertension, unspecified -  Blood pressure is well controlled on today's visit. No changes made to the medications.  OSA on CPAP  Previously seen by pulmonary Tested positive per the patient Uses CPAP, on and off  COPD   stopped smoking several years ago Stable sx  Chronic pain Chest, legs, back, arms Managed by chiropractic, massage  Leg swelling On lasix daily, stable renal function   Total encounter time more than 25 minutes  Greater than 50% was spent in counseling and coordination of care with the patient  Disposition:   F/U  12 months    Orders  Placed This Encounter  Procedures  . EKG 12-Lead     Signed, Esmond Plants, M.D., Ph.D. 10/10/2019  [place], [place]

## 2019-10-10 ENCOUNTER — Ambulatory Visit (INDEPENDENT_AMBULATORY_CARE_PROVIDER_SITE_OTHER): Payer: Medicare HMO | Admitting: Cardiovascular Disease

## 2019-10-10 ENCOUNTER — Encounter: Payer: Self-pay | Admitting: Cardiovascular Disease

## 2019-10-10 ENCOUNTER — Other Ambulatory Visit: Payer: Self-pay

## 2019-10-10 VITALS — BP 110/78 | HR 72 | Ht 65.0 in | Wt 198.5 lb

## 2019-10-10 DIAGNOSIS — I25118 Atherosclerotic heart disease of native coronary artery with other forms of angina pectoris: Secondary | ICD-10-CM | POA: Diagnosis not present

## 2019-10-10 DIAGNOSIS — I5042 Chronic combined systolic (congestive) and diastolic (congestive) heart failure: Secondary | ICD-10-CM | POA: Diagnosis not present

## 2019-10-10 DIAGNOSIS — I255 Ischemic cardiomyopathy: Secondary | ICD-10-CM | POA: Diagnosis not present

## 2019-10-10 DIAGNOSIS — I10 Essential (primary) hypertension: Secondary | ICD-10-CM | POA: Diagnosis not present

## 2019-10-10 DIAGNOSIS — I739 Peripheral vascular disease, unspecified: Secondary | ICD-10-CM | POA: Diagnosis not present

## 2019-10-10 DIAGNOSIS — E785 Hyperlipidemia, unspecified: Secondary | ICD-10-CM

## 2019-10-10 MED ORDER — METOPROLOL SUCCINATE ER 50 MG PO TB24: 50.0000 mg | ORAL_TABLET | Freq: Every day | ORAL | 3 refills | Status: DC

## 2019-10-10 NOTE — Patient Instructions (Addendum)
Medication Instructions:  Your physician has recommended you make the following change in your medication:   1. STOP Metoprolol tartrate 2. START Metoprolol succinate 50 mg once daily  Try cetirazine/zyrtec for allergy   If you need a refill on your cardiac medications before your next appointment, please call your pharmacy.    Lab work: No new labs needed   If you have labs (blood work) drawn today and your tests are completely normal, you will receive your results only by: [name] MyChart Message (if you have MyChart) OR . A paper copy in the mail If you have any lab test that is abnormal or we need to change your treatment, we will call you to review the results.   Testing/Procedures: No new testing needed   Follow-Up: At [facility], you and your health needs are our priority.  As part of our continuing mission to provide you with exceptional heart care, we have created designated Provider Care Teams.  These Care Teams include your primary Cardiologist (physician) and Advanced Practice Providers (APPs -  Physician Assistants and Nurse Practitioners) who all work together to provide you with the care you need, when you need it.  . You will need a follow up appointment in 12 months   . Providers on your designated Care Team:   . Murray Hodgkins, NP . Christell Faith, PA-C . Marrianne Mood, PA-C  Any Other Special Instructions Will Be Listed Below (If Applicable).  For educational health videos Log in to : [email or web address] Or : [email or web address], password : [email or web address]

## 2019-10-10 NOTE — Telephone Encounter (Signed)
Similasan allergy relief eye drops otc [facility] or [facility]  [name]

## 2019-10-10 NOTE — Telephone Encounter (Signed)
Patient informed and verbalized understanding

## 2019-10-13 ENCOUNTER — Encounter: Payer: Self-pay | Admitting: Internal Medicine

## 2019-10-13 ENCOUNTER — Telehealth (INDEPENDENT_AMBULATORY_CARE_PROVIDER_SITE_OTHER): Payer: Medicare HMO | Admitting: Internal Medicine

## 2019-10-13 VITALS — Ht 65.0 in | Wt 198.5 lb

## 2019-10-13 DIAGNOSIS — G4733 Obstructive sleep apnea (adult) (pediatric): Secondary | ICD-10-CM | POA: Diagnosis not present

## 2019-10-13 DIAGNOSIS — I2510 Atherosclerotic heart disease of native coronary artery without angina pectoris: Secondary | ICD-10-CM

## 2019-10-13 DIAGNOSIS — E876 Hypokalemia: Secondary | ICD-10-CM | POA: Diagnosis not present

## 2019-10-13 DIAGNOSIS — R197 Diarrhea, unspecified: Secondary | ICD-10-CM

## 2019-10-13 DIAGNOSIS — E119 Type 2 diabetes mellitus without complications: Secondary | ICD-10-CM

## 2019-10-13 DIAGNOSIS — K582 Mixed irritable bowel syndrome: Secondary | ICD-10-CM | POA: Diagnosis not present

## 2019-10-13 DIAGNOSIS — J449 Chronic obstructive pulmonary disease, unspecified: Secondary | ICD-10-CM

## 2019-10-13 DIAGNOSIS — J4520 Mild intermittent asthma, uncomplicated: Secondary | ICD-10-CM

## 2019-10-13 DIAGNOSIS — E785 Hyperlipidemia, unspecified: Secondary | ICD-10-CM | POA: Diagnosis not present

## 2019-10-13 DIAGNOSIS — K5900 Constipation, unspecified: Secondary | ICD-10-CM

## 2019-10-13 DIAGNOSIS — K5909 Other constipation: Secondary | ICD-10-CM | POA: Diagnosis not present

## 2019-10-13 DIAGNOSIS — K219 Gastro-esophageal reflux disease without esophagitis: Secondary | ICD-10-CM

## 2019-10-13 DIAGNOSIS — Z9989 Dependence on other enabling machines and devices: Secondary | ICD-10-CM

## 2019-10-13 DIAGNOSIS — J309 Allergic rhinitis, unspecified: Secondary | ICD-10-CM

## 2019-10-13 HISTORY — DX: Mild intermittent asthma, uncomplicated: J45.20

## 2019-10-13 MED ORDER — NITROGLYCERIN 0.4 MG SL SUBL: 0.4000 mg | SUBLINGUAL_TABLET | SUBLINGUAL | 6 refills | Status: DC | PRN

## 2019-10-13 MED ORDER — POTASSIUM CHLORIDE CRYS ER 10 MEQ PO TBCR: 10.0000 meq | EXTENDED_RELEASE_TABLET | Freq: Every day | ORAL | 3 refills | Status: DC

## 2019-10-13 MED ORDER — ROSUVASTATIN CALCIUM 20 MG PO TABS: 20.0000 mg | ORAL_TABLET | Freq: Every day | ORAL | 3 refills | Status: DC

## 2019-10-13 MED ORDER — LEVOCETIRIZINE DIHYDROCHLORIDE 5 MG PO TABS: 5.0000 mg | ORAL_TABLET | Freq: Every evening | ORAL | 3 refills | Status: DC | PRN

## 2019-10-13 MED ORDER — ESOMEPRAZOLE MAGNESIUM 40 MG PO CPDR: 40.0000 mg | DELAYED_RELEASE_CAPSULE | Freq: Every day | ORAL | 3 refills | Status: DC

## 2019-10-13 MED ORDER — DICYCLOMINE HCL 10 MG PO CAPS: 10.0000 mg | ORAL_CAPSULE | Freq: Three times a day (TID) | ORAL | 11 refills | Status: DC

## 2019-10-13 MED ORDER — IPRATROPIUM-ALBUTEROL 0.5-2.5 (3) MG/3ML IN SOLN: 3.0000 mL | Freq: Two times a day (BID) | RESPIRATORY_TRACT | 11 refills | Status: DC | PRN

## 2019-10-13 MED ORDER — LINACLOTIDE 72 MCG PO CAPS: 72.0000 ug | ORAL_CAPSULE | Freq: Every day | ORAL | 3 refills | Status: DC

## 2019-10-13 MED ORDER — ANORO ELLIPTA 62.5-25 MCG/INH IN AEPB: 1.0000 | INHALATION_SPRAY | Freq: Every day | RESPIRATORY_TRACT | 12 refills | Status: DC

## 2019-10-13 NOTE — Progress Notes (Signed)
Virtual Visit via Video Note  I connected with [name]   on 10/13/19 at  1:45 PM EDT by a video enabled telemedicine application and verified that I am speaking with the correct person using two identifiers.  Location patient: family dollar, car  Location provider:work or home office Persons participating in the virtual visit: patient, provider  I discussed the limitations of evaluation and management by telemedicine and the availability of in person appointments. The patient expressed understanding and agreed to proceed.   HPI: 1. DM 2 A1C 6.7 on metformin 500 mg qd  2. Constipation/diarrhea/nausea on zofran, bentyl, miralax prn, linzess is helping constipation 145 mg dose but having 3 stools daily  meds helped   3. OSA on cpap using and tolerating and helping  4. H/o CAD no chest cards changed from toprol bid to xl qd   ROS: See pertinent positives and negatives per HPI. Denies CP, no sob    Past Medical History:  Diagnosis Date  . Allergy   . Anemia   . Arthritis   . Asthma   . Bacterial vaginitis   . Blood in stool   . Brachial neuritis or radiculitis NOS   . 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 being followed by neurosurgery/eye doctor and pcp. monitoring size.causes sinus problems  . Bronchitis   . Cardiac arrhythmia   . Cervical neck pain with evidence of disc disease    C5/6 disease, MRI done late 2012 - no records available  . Chronic combined systolic and diastolic CHF (congestive heart failure) ([place])    a. 08/2010 Echo: mildly reduced EF 40-45%, mild diffuse hypokinesis; b. 06/2016 Echo: EF 50%, no rwma, mild to mod TR; c. 03/2017 Echo: EF 40-45%, Gr1 DD (prior echo reviewed and EF felt to be lower than reported).  . Chronic pain   . Cocaine abuse, in remission ([place])    clean x 24 years  . Colon polyps   . COPD (chronic obstructive pulmonary disease) ([place])    a. 12/2018 PFT: No obvious obst/restrictive dzs.  . Coronary artery disease    a.  PCI of LCX 2003; b. PCI of the LAD 2012 with a (2.5 x 8 mm BMS);  c.s/p CABG 4/12: L-LAD, VG-Dx, VG-OM, VG-RCA (Dr. Prescott Gum);  d. 01/2012 MV: inf infarct, attenuation, no ischemia; e. 02/2017 MV: signifi attenuation artifact. Fixed basal antlat/inflat scar vs artifact. Reversible apical lat and mid antlat defect - ? atten vs ischemia. F/u echo w/o wma->Med rx.  . Depression   . Diabetes mellitus without complication ([place])   . Family history of colon cancer   . Generalized headaches    frequent  . GERD (gastroesophageal reflux disease)   . Headache   . Heart murmur   . Hematochezia   . Hepatitis    history of hepatitis b  . History of cervical cancer    s/p cryotherapy  . History of drug abuse ([place])    cocaine, marijuana, clean since 1989  . History of hepatitis B    from eating undercooked liver  . History of MI (myocardial infarction)   . Hyperlipidemia   . Hypertension   . Ischemic cardiomyopathy    a. 03/2017 Echo: EF 40-45%, Gr1 DD.  [name] Myocardial infarction [facility]) 2003, 2012  . PAD (peripheral artery disease) ([place])    a. s/p Right SFA atherectomy and PTA 01/15/11; b. 07/2018 ABI: R 1.02, L 1.09.  [name] Pituitary mass ([place])    a. 12/2018  MRI Brain: Stable pituitary mass w/ 14mm area of necrosis. Mass abuts R cavernous sinus w/o definite invasion.  . Polyp of colon   . Seasonal allergies   . Sleep apnea    uses cpap  . Smoking history    quit 07/2010  . Thyroid disease   . Urinary incontinence     Past Surgical History:  Procedure Laterality Date  . ABDOMINAL HYSTERECTOMY     2003  . CHOLECYSTECTOMY  1986  . COLONOSCOPY  2008   3 polyps  . COLONOSCOPY WITH PROPOFOL N/A 02/06/2015   Procedure: COLONOSCOPY WITH PROPOFOL;  Surgeon: Lucilla Lame, MD;  Location: [facility] ENDOSCOPY;  Service: Endoscopy;  Laterality: N/A;  . COLONOSCOPY WITH PROPOFOL N/A 01/27/2017   Procedure: COLONOSCOPY WITH PROPOFOL;  Surgeon: Lucilla Lame, MD;  Location: [facility] ENDOSCOPY;  Service: Endoscopy;  Laterality:  N/A;  . CORONARY ANGIOPLASTY     w/ stent placement x2  . CORONARY ARTERY BYPASS GRAFT  2012   Dr [name]  . CORONARY STENT PLACEMENT  2003   S/P MI  . CORONARY STENT PLACEMENT  2007   [place]  . ESOPHAGOGASTRODUODENOSCOPY (EGD) WITH PROPOFOL N/A 02/06/2015   Procedure: ESOPHAGOGASTRODUODENOSCOPY (EGD) WITH PROPOFOL;  Surgeon: Lucilla Lame, MD;  Location: [facility] ENDOSCOPY;  Service: Endoscopy;  Laterality: N/A;  . ESOPHAGOGASTRODUODENOSCOPY (EGD) WITH PROPOFOL N/A 01/27/2017   Procedure: ESOPHAGOGASTRODUODENOSCOPY (EGD) WITH PROPOFOL;  Surgeon: Lucilla Lame, MD;  Location: [facility] ENDOSCOPY;  Service: Endoscopy;  Laterality: N/A;  . ESOPHAGOGASTRODUODENOSCOPY (EGD) WITH PROPOFOL N/A 09/01/2019   Procedure: ESOPHAGOGASTRODUODENOSCOPY (EGD) WITH PROPOFOL;  Surgeon: Lin Landsman, MD;  Location: [facility] ENDOSCOPY;  Service: Gastroenterology;  Laterality: N/A;  . FEMORAL ARTERY STENT  10/2010   right sided (Dr. Burt Knack)  . KNEE ARTHROSCOPY WITH MEDIAL MENISECTOMY Right 08/20/2017   Procedure: KNEE ARTHROSCOPY WITH MEDIAL  AND LATERAL MENISECTOMY;  Surgeon: Hessie Knows, MD;  Location: [facility] ORS;  Service: Orthopedics;  Laterality: Right;  . POSTERIOR CERVICAL LAMINECTOMY N/A 03/08/2018   Procedure: POSTERIOR CERVICAL LAMINECTOMY-C7;  Surgeon: Deetta Perla, MD;  Location: [facility] ORS;  Service: Neurosurgery;  Laterality: N/A;  . TONSILLECTOMY    . TOTAL KNEE ARTHROPLASTY Right 04/14/2019   Procedure: RIGHT TOTAL KNEE ARTHROPLASTY;  Surgeon: Hessie Knows, MD;  Location: [facility] ORS;  Service: Orthopedics;  Laterality: Right;  . TUBAL LIGATION      Family History  Problem Relation Age of [place]  . Hypertension Father   . Heart failure Father   . Diabetes Father   . Colon cancer Father 24  . Glaucoma Father   . Cancer Father        colorectal   . Heart disease Father   . Other Father        glaucoma  . Breast cancer Mother 32       breast cancer, late 82's  . Cancer Mother        breast  . Brain cancer  Sister   . Arthritis Sister   . Diabetes Sister   . Hypertension Sister   . Kidney disease Sister   . Diabetes Brother   . Hypertension Brother   . Other Sister        brain tumor   . Acute myelogenous leukemia Grandson        06/2019  . Coronary artery disease Neg Hx   . 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  albuterol (VENTOLIN HFA) 108 (90 Base) MCG/ACT inhaler, Inhale 2 puffs into the lungs every 6 (six) hours as needed for wheezing or shortness of breath., Disp: 1 g, Rfl: 12 .  aspirin EC 81 MG tablet, Take 1 tablet (81 mg total) by mouth daily., Disp: , Rfl:  .  Azelastine HCl 0.15 % SOLN, Place 2 sprays into both nostrils daily as needed (allergies). , Disp: , Rfl:  .  Evolocumab (REPATHA SURECLICK) XX123456 MG/ML SOAJ, Inject 1 pen into the skin every 14 (fourteen) days., Disp: 2 pen, Rfl: 10 .  ezetimibe (ZETIA) 10 MG tablet, Take 1 tablet (10 mg total) by mouth daily., Disp: 90 tablet, Rfl: 3 .  fluticasone (FLONASE) 50 MCG/ACT nasal spray, Place 2 sprays into both nostrils daily as needed for allergies. , Disp: , Rfl:  .  fluticasone (FLOVENT HFA) 110 MCG/ACT inhaler, Inhale 1 puff into the lungs 2 (two) times a day. Rinse mouth, Disp: 1 Inhaler, Rfl: 12 .  furosemide (LASIX) 20 MG tablet, TAKE 1 TABLET BY MOUTH ONCE DAILY AND A SECOND TABLET IF NEEDED FOR FLUID BUILD UP., Disp: 180 tablet, Rfl: 0 .  glucose blood (ONE TOUCH ULTRA TEST) test strip, Use as instructed check cbg qd E11.9, Disp: 100 each, Rfl: 12 .  hydrOXYzine (ATARAX/VISTARIL) 25 MG tablet, Take 1-2 tablets (25-50 mg total) by mouth daily as needed., Disp: 60  tablet, Rfl: 2 .  ipratropium-albuterol (DUONEB) 0.5-2.5 (3) MG/3ML SOLN, Take 3 mLs by nebulization 2 (two) times daily as needed (wheezing/shortness of breath)., Disp: 360 mL, Rfl: 11 .  isosorbide mononitrate (IMDUR) 30 MG 24 hr tablet, Take 1 tablet (30 mg total) by mouth 2 (two) times daily., Disp: 180 tablet, Rfl: 1 .  Lactobacillus (ACIDOPHILUS PROBIOTIC) 10 MG CAPS, Take 1 capsule by mouth daily., Disp: 30 capsule, Rfl: 2 .  Lancets MISC, 1 Device by Does not apply route daily. Lancets E11.9, Disp: 90 each, Rfl: 3 .  levocetirizine (XYZAL) 5 MG tablet, Take 1 tablet (5 mg total) by mouth at bedtime as needed for allergies., Disp: 90 tablet, Rfl: 3 .  meclizine (ANTIVERT) 25 MG tablet, Take 25 mg by mouth 3 (three) times daily. , Disp: , Rfl: 3 .  metFORMIN (GLUCOPHAGE) 500 MG tablet, Take 1 tablet (500 mg total) by mouth daily with breakfast., Disp: 90 tablet, Rfl: 3 .  methocarbamol (ROBAXIN) 500 MG tablet, Take 1 tablet (500 mg total) by mouth every 6 (six) hours as needed for muscle spasms., Disp: 30 tablet, Rfl: 0 .  metoprolol succinate (TOPROL-XL) 50 MG 24 hr tablet, Take 1 tablet (50 mg total) by mouth daily. Take with or immediately following a meal., Disp: 90 tablet, Rfl: 3 .  mirabegron ER (MYRBETRIQ) 25 MG TB24 tablet, Take 1 tablet (25 mg total) by mouth daily. (Patient taking differently: Take 25 mg by mouth daily as needed (urinary frequency). ), Disp: 30 tablet, Rfl: 11 .  nitroGLYCERIN (NITROSTAT) 0.4 MG SL tablet, Place 1 tablet (0.4 mg total) under the tongue every 5 (five) minutes as needed., Disp: 25 tablet, Rfl: 6 .  ondansetron (ZOFRAN) 4 MG tablet, Take 1 tablet (4 mg total) by mouth every 8 (eight) hours as needed for nausea or vomiting.,  Disp: 20 tablet, Rfl: 0 .  PARoxetine (PAXIL) 20 MG tablet, Take 1 tablet (20 mg total) by mouth daily., Disp: 90 tablet, Rfl: 3 .  potassium chloride (KLOR-CON) 10 MEQ tablet, Take 1 tablet (10 mEq total) by mouth daily., Disp: 90  tablet, Rfl: 3 .  rivaroxaban (XARELTO) 2.5 MG TABS tablet, Take 1 tablet (2.5 mg total) by mouth 2 (two) times daily., Disp: 60 tablet, Rfl: 5 .  rosuvastatin (CRESTOR) 20 MG tablet, Take 1 tablet (20 mg total) by mouth at bedtime., Disp: 90 tablet, Rfl: 3 .  umeclidinium-vilanterol (ANORO ELLIPTA) 62.5-25 MCG/INH AEPB, Inhale 1 puff into the lungs daily., Disp: 60 each, Rfl: 12 .  dicyclomine (BENTYL) 10 MG capsule, Take 1 capsule (10 mg total) by mouth 3 (three) times daily before meals. As needed for abdominal pain, Disp: 120 capsule, Rfl: 11 .  esomeprazole (NEXIUM) 40 MG capsule, Take 1 capsule (40 mg total) by mouth daily. Reported on 09/04/2015, Disp: 90 capsule, Rfl: 3 .  linaclotide (LINZESS) 72 MCG capsule, Take 1 capsule (72 mcg total) by mouth daily before breakfast., Disp: 90 capsule, Rfl: 3 .  losartan (COZAAR) 25 MG tablet, Take 1 tablet (25 mg total) by mouth daily. (Patient taking differently: Take 25 mg by mouth every evening. ), Disp: 90 tablet, Rfl: 3  EXAM:  VITALS per patient if applicable:  GENERAL: alert, oriented, appears well and in no acute distress  HEENT: atraumatic, conjunttiva clear, no obvious abnormalities on inspection of external nose and ears  NECK: normal movements of the head and neck  LUNGS: on inspection no signs of respiratory distress, breathing rate appears normal, no obvious gross SOB, gasping or wheezing  CV: no obvious cyanosis  MS: moves all visible extremities without noticeable abnormality  PSYCH/NEURO: pleasant and cooperative, no obvious depression or anxiety, speech and thought processing grossly intact  ASSESSMENT AND PLAN:  Discussed the following assessment and plan:  Irritable bowel syndrome with both constipation and diarrhea - Plan: linaclotide (LINZESS) 72 MCG capsule reduce from 145, dicyclomine (BENTYL) 10 MG capsule qid prn Prn miralax  Type 2 diabetes mellitus without complication, without long-term current use of insulin  (HCC) Cont metformin qd 500 mg qd Do foot exam in future   OSA on CPAP cont. Benefiting   Chronic obstructive pulmonary disease, unspecified COPD type ([place]) - Plan: umeclidinium-vilanterol (ANORO ELLIPTA) 62.5-25 MCG/INH AEPB  Hyperlipidemia, unspecified hyperlipidemia type - Plan: rosuvastatin (CRESTOR) 20 MG tablet  Hypokalemia - Plan: potassium chloride (KLOR-CON) 10 MEQ tablet  Coronary artery disease involving native coronary artery of native heart without angina pectoris - Plan: nitroGLYCERIN (NITROSTAT) 0.4 MG SL tablet  Allergic rhinitis, unspecified seasonality, unspecified trigger - Plan: levocetirizine (XYZAL) 5 MG tablet  Mild intermittent asthma, unspecified whether complicated - Plan: ipratropium-albuterol (DUONEB) 0.5-2.5 (3) MG/3ML SOLN F/u leb pulmonary   Gastroesophageal reflux disease without esophagitis - Plan: esomeprazole (NEXIUM) 40 MG capsule  Anxiety better on paxil 20 mg qd  Not hydroxyzine had to use increased paxil  HM Flu shotutd 10/8/20utd Considershingrix in futureat pharmacy pna 23 had 03/10/16  S/p hysterectomy no cervix and f/u OB/GYNh/o abnormal pap s/p cryo  -pap 05/08/15 neg papwill Repeat In 5 years   Colonoscopy Dr. Allen Norris 01/2017 polyps, gastritis, diverticulosis  Mammogram3/18/2020 negativere ordered sch 09/01/19 left dx mammogram sch 09/2019   DEXAneg 10/13/2018  -we discussed possible serious and likely etiologies, options for evaluation and workup, limitations of telemedicine visit vs in person visit, treatment, treatment risks and precautions. Pt  prefers to treat via telemedicine empirically rather then risking or undertaking an in person visit at this moment. Patient agrees to seek prompt in person care if worsening, new symptoms arise, or if is not improving with treatment.   I discussed the assessment and treatment plan with the patient. The patient was provided an opportunity to ask questions and all were answered.  The patient agreed with the plan and demonstrated an understanding of the instructions.   The patient was advised to call back or seek an in-person evaluation if the symptoms worsen or if the condition fails to improve as anticipated.  Time spent 20 minutes  Delorise Jackson, MD

## 2019-10-13 NOTE — Patient Instructions (Signed)

## 2019-10-14 DIAGNOSIS — R69 Illness, unspecified: Secondary | ICD-10-CM | POA: Diagnosis not present

## 2019-10-18 ENCOUNTER — Ambulatory Visit
Admission: RE | Admit: 2019-10-18 | Discharge: 2019-10-18 | Disposition: A | Discharge status: Home / Self Care | Payer: Medicare HMO | Source: Ambulatory Visit | Attending: Internal Medicine | Admitting: Internal Medicine

## 2019-10-18 ENCOUNTER — Other Ambulatory Visit: Payer: Self-pay | Admitting: Internal Medicine

## 2019-10-18 DIAGNOSIS — Z1231 Encounter for screening mammogram for malignant neoplasm of breast: Secondary | ICD-10-CM | POA: Diagnosis not present

## 2019-10-18 DIAGNOSIS — N6489 Other specified disorders of breast: Secondary | ICD-10-CM

## 2019-10-18 DIAGNOSIS — R928 Other abnormal and inconclusive findings on diagnostic imaging of breast: Secondary | ICD-10-CM

## 2019-10-27 DIAGNOSIS — I259 Chronic ischemic heart disease, unspecified: Secondary | ICD-10-CM | POA: Diagnosis not present

## 2019-10-27 DIAGNOSIS — G4710 Hypersomnia, unspecified: Secondary | ICD-10-CM | POA: Diagnosis not present

## 2019-10-27 DIAGNOSIS — G4733 Obstructive sleep apnea (adult) (pediatric): Secondary | ICD-10-CM | POA: Diagnosis not present

## 2019-10-28 ENCOUNTER — Ambulatory Visit
Admission: RE | Admit: 2019-10-28 | Discharge: 2019-10-28 | Disposition: A | Discharge status: Home / Self Care | Payer: Medicare HMO | Source: Ambulatory Visit | Attending: Internal Medicine | Admitting: Internal Medicine

## 2019-10-28 DIAGNOSIS — N6489 Other specified disorders of breast: Secondary | ICD-10-CM | POA: Diagnosis not present

## 2019-10-28 DIAGNOSIS — N6313 Unspecified lump in the right breast, lower outer quadrant: Secondary | ICD-10-CM | POA: Diagnosis not present

## 2019-10-28 DIAGNOSIS — R928 Other abnormal and inconclusive findings on diagnostic imaging of breast: Secondary | ICD-10-CM | POA: Insufficient documentation

## 2019-10-30 ENCOUNTER — Other Ambulatory Visit: Payer: Self-pay | Admitting: Internal Medicine

## 2019-10-30 DIAGNOSIS — N6310 Unspecified lump in the right breast, unspecified quadrant: Secondary | ICD-10-CM

## 2019-11-02 ENCOUNTER — Telehealth: Payer: Self-pay | Admitting: Internal Medicine

## 2019-11-02 NOTE — Telephone Encounter (Signed)
Error

## 2019-11-04 ENCOUNTER — Other Ambulatory Visit: Payer: Self-pay | Admitting: Cardiovascular Disease

## 2019-11-04 ENCOUNTER — Ambulatory Visit: Payer: Medicare HMO | Admitting: Pulmonary Disease

## 2019-11-04 NOTE — Telephone Encounter (Signed)
Refill request

## 2019-11-04 NOTE — Telephone Encounter (Signed)
CBC and BMET stable in Jan 2021, PAD/CAD indication of Xarelto 2.5mg  BID is appropriate, refill sent in.

## 2019-11-09 ENCOUNTER — Other Ambulatory Visit: Payer: Self-pay | Admitting: Cardiovascular Disease

## 2019-11-14 ENCOUNTER — Other Ambulatory Visit: Payer: Self-pay | Admitting: Internal Medicine

## 2019-11-14 DIAGNOSIS — N6310 Unspecified lump in the right breast, unspecified quadrant: Secondary | ICD-10-CM

## 2019-11-14 DIAGNOSIS — D352 Benign neoplasm of pituitary gland: Secondary | ICD-10-CM | POA: Diagnosis not present

## 2019-11-15 ENCOUNTER — Ambulatory Visit (INDEPENDENT_AMBULATORY_CARE_PROVIDER_SITE_OTHER): Payer: Medicare HMO | Admitting: Gastroenterology

## 2019-11-15 ENCOUNTER — Encounter: Payer: Self-pay | Admitting: Gastroenterology

## 2019-11-15 ENCOUNTER — Other Ambulatory Visit: Payer: Self-pay

## 2019-11-15 VITALS — BP 111/76 | HR 80 | Temp 97.8°F | Wt 206.0 lb

## 2019-11-15 DIAGNOSIS — R1032 Left lower quadrant pain: Secondary | ICD-10-CM

## 2019-11-15 DIAGNOSIS — K5730 Diverticulosis of large intestine without perforation or abscess without bleeding: Secondary | ICD-10-CM | POA: Diagnosis not present

## 2019-11-15 DIAGNOSIS — R29898 Other symptoms and signs involving the musculoskeletal system: Secondary | ICD-10-CM | POA: Diagnosis not present

## 2019-11-15 DIAGNOSIS — M542 Cervicalgia: Secondary | ICD-10-CM | POA: Diagnosis not present

## 2019-11-15 DIAGNOSIS — K219 Gastro-esophageal reflux disease without esophagitis: Secondary | ICD-10-CM

## 2019-11-15 DIAGNOSIS — K5909 Other constipation: Secondary | ICD-10-CM

## 2019-11-15 NOTE — Progress Notes (Signed)
Cephas Darby, MD [address]  [place]  [place], [place] [address]  Main: [phone]  Fax: [phone]    Gastroenterology Consultation  Referring Provider:     [name], Olivia Mackie * Primary Care Physician:  [name], [name], MD Primary Gastroenterologist:  Dr. Cephas Darby Reason for Consultation:   Left lower quadrant pain, chronic constipation        HPI:   [name] is a 59 y.o. female referred by Dr. Terese Door, [name], MD  for consultation & management of diffuse quadrant pain, chronic constipation.  Patient has history of chronic constipation almost all her life and fecal impaction at age of 73.  Patient saw Dr. Terese Door on 08/16/2019 secondary to severe left sided abdominal pain associated with nausea.  She was started on MiraLAX, Bentyl as well as empirically treated for acute sigmoid diverticulitis with 10 days course of Cipro and Flagyl.  Patient reports that she finished antibiotics however, pain persisted and she feels like something stuck in her left upper quadrant and has to come out, pain is predominantly in the left upper quadrant radiating to the back and lower abdomen.  She denies fever, chills.  She does feel nauseous, denies vomiting.  She denies abdominal bloating, rectal bleeding.  She reports her stools are watery, has tried enema and suppository this does not work. She does not smoke or drink alcohol Known history of sigmoid diverticulosis  Follow-up video visit 09/07/2019 I initially saw Ms. Hosley on 08/25/2019 due to severe left lower quadrant pain, severe constipation and she was empirically being treated for acute sigmoid diverticulitis.  Repeat CT scan did not reveal diverticulitis.  Upper endoscopy was unremarkable as well.  I treated her constipation with GoLYTELY followed by Linzess 145 MCG and high-fiber diet with fiber supplements.  She reports that she is responding very well to Linzess and fiber supplements.  She  is taking Linzess every day and her bowel movements are now formed.  She reports that her left lower quadrant pain is gradually improving.  She is very pleased with the way her symptoms are improving.  She drinks sodas daily and eats red meat regularly.  Follow-up visit 11/15/2019 I started her on Linzess 145 MCG daily which worked very well initially, however she was having 4-5 loose bowel movements daily.  So, her PCP switched to Linzess 72 MCG daily.  She is currently having 2 bowel movements daily.  She does have mild left lower quadrant discomfort.  She has admitted drinking sodas.  Currently drinking more water.  She has been stressed out recently as her grandson passed away from AML at age 52.  She is currently going through grief response and worried about her daughter.  She does report intermittent epigastric discomfort and frequent belching.  She is currently on Nexium 40 mg daily.  Her weight has been stable  She does not smoke or drink alcohol  NSAIDs: None  Antiplts/Anticoagulants/Anti thrombotics: None  GI Procedures:  EGD 09/01/2019 - Normal duodenal bulb and second portion of the duodenum. - Small hiatal hernia. - Normal stomach. - Normal gastroesophageal junction and esophagus. - No specimens collected.  Antiplts/Anticoagulants/Anti thrombotics:None  EGD and colonoscopy in 2018 by Dr. Allen Norris - Small hiatal hernia. - Non-bleeding gastric ulcers with no stigmata of bleeding. Biopsied. - Gastritis. Biopsied. - Normal examined duodenum.  - Two 2 to 4 mm polyps in the sigmoid colon, removed with a cold snare. Resected and retrieved. - One 4 mm polyp in the  ascending colon, removed with a cold snare. Resected and retrieved. - Diverticulosis in the sigmoid colon. - Non-bleeding internal hemorrhoids.  DIAGNOSIS:  A. STOMACH ULCERATION; COLD BIOPSY:  - MILD ACTIVE GASTRITIS WITH ULCERATION.  - NEGATIVE FOR DYSPLASIA AND MALIGNANCY.   ADDENDUM:  Immunohistochemical  stain for H. pylori is negative with an appropriate  control.   B. COLON POLYP, ASCENDING; COLD SNARE:  - TUBULAR ADENOMA.  - NEGATIVE FOR HIGH-GRADE DYSPLASIA AND MALIGNANCY.   C. COLON POLYP 2, SIGMOID; COLD SNARE:  - TUBULAR ADENOMAS (2).  - NEGATIVE FOR HIGH-GRADE DYSPLASIA AND MALIGNANCY.   EGD and colonoscopy in 2016 by Dr. Allen Norris  DIAGNOSIS:  A. COLON POLYP, ASCENDING; HOT SNARE:  - TUBULAR ADENOMA, 1.0 CM FRAGMENT AND SEVERAL SMALLER FRAGMENTS.  - NEGATIVE FOR HIGH-GRADE DYSPLASIA AND MALIGNANCY.  EGD and colonoscopy in 2018 by Dr. Allen Norris - Small hiatal hernia. - Non-bleeding gastric ulcers with no stigmata of bleeding. Biopsied. - Gastritis. Biopsied. - Normal examined duodenum.  - Two 2 to 4 mm polyps in the sigmoid colon, removed with a cold snare. Resected and retrieved. - One 4 mm polyp in the ascending colon, removed with a cold snare. Resected and retrieved. - Diverticulosis in the sigmoid colon. - Non-bleeding internal hemorrhoids.  DIAGNOSIS:  A. STOMACH ULCERATION; COLD BIOPSY:  - MILD ACTIVE GASTRITIS WITH ULCERATION.  - NEGATIVE FOR DYSPLASIA AND MALIGNANCY.   ADDENDUM:  Immunohistochemical stain for H. pylori is negative with an appropriate  control.   B. COLON POLYP, ASCENDING; COLD SNARE:  - TUBULAR ADENOMA.  - NEGATIVE FOR HIGH-GRADE DYSPLASIA AND MALIGNANCY.   C. COLON POLYP 2, SIGMOID; COLD SNARE:  - TUBULAR ADENOMAS (2).  - NEGATIVE FOR HIGH-GRADE DYSPLASIA AND MALIGNANCY.   EGD and colonoscopy in 2016 by Dr. Allen Norris  DIAGNOSIS:  A. COLON POLYP, ASCENDING; HOT SNARE:  - TUBULAR ADENOMA, 1.0 CM FRAGMENT AND SEVERAL SMALLER FRAGMENTS.  - NEGATIVE FOR HIGH-GRADE DYSPLASIA AND MALIGNANCY.   Past Medical History:  Diagnosis Date  . Allergy   . Anemia   . Arthritis   . Asthma   . Bacterial vaginitis   . Blood in stool   . Brachial neuritis or radiculitis NOS   . 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 being followed by  neurosurgery/eye doctor and pcp. monitoring size.causes sinus problems  . Bronchitis   . Cardiac arrhythmia   . Cervical neck pain with evidence of disc disease    C5/6 disease, MRI done late 2012 - no records available  . Chronic combined systolic and diastolic CHF (congestive heart failure) ([place])    a. 08/2010 Echo: mildly reduced EF 40-45%, mild diffuse hypokinesis; b. 06/2016 Echo: EF 50%, no rwma, mild to mod TR; c. 03/2017 Echo: EF 40-45%, Gr1 DD (prior echo reviewed and EF felt to be lower than reported).  . Chronic pain   . Cocaine abuse, in remission ([place])    clean x 24 years  . Colon polyps   . COPD (chronic obstructive pulmonary disease) ([place])    a. 12/2018 PFT: No obvious obst/restrictive dzs.  . Coronary artery disease    a. PCI of LCX 2003; b. PCI of the LAD 2012 with a (2.5 x 8 mm BMS);  c.s/p CABG 4/12: L-LAD, VG-Dx, VG-OM, VG-RCA (Dr. Prescott Gum);  d. 01/2012 MV: inf infarct, attenuation, no ischemia; e. 02/2017 MV: signifi attenuation artifact. Fixed basal antlat/inflat scar vs artifact. Reversible apical lat and mid antlat defect - ? atten vs ischemia. F/u  echo w/o wma->Med rx.  . Depression   . Diabetes mellitus without complication ([place])   . Family history of colon cancer   . Generalized headaches    frequent  . GERD (gastroesophageal reflux disease)   . Headache   . Heart murmur   . Hematochezia   . Hepatitis    history of hepatitis b  . History of cervical cancer    s/p cryotherapy  . History of drug abuse ([place])    cocaine, marijuana, clean since 1989  . History of hepatitis B    from eating undercooked liver  . History of MI (myocardial infarction)   . Hyperlipidemia   . Hypertension   . Ischemic cardiomyopathy    a. 03/2017 Echo: EF 40-45%, Gr1 DD.  [name] Myocardial infarction [facility]) 2003, 2012  . PAD (peripheral artery disease) ([place])    a. s/p Right SFA atherectomy and PTA 01/15/11; b. 07/2018 ABI: R 1.02, L 1.09.  [name] Pituitary mass ([place])    a. 12/2018 MRI Brain:  Stable pituitary mass w/ 7mm area of necrosis. Mass abuts R cavernous sinus w/o definite invasion.  . Polyp of colon   . Seasonal allergies   . Sleep apnea    uses cpap  . Smoking history    quit 07/2010  . Thyroid disease   . Urinary incontinence     Past Surgical History:  Procedure Laterality Date  . ABDOMINAL HYSTERECTOMY     2003  . CHOLECYSTECTOMY  1986  . COLONOSCOPY  2008   3 polyps  . COLONOSCOPY WITH PROPOFOL N/A 02/06/2015   Procedure: COLONOSCOPY WITH PROPOFOL;  Surgeon: Lucilla Lame, MD;  Location: [facility] ENDOSCOPY;  Service: Endoscopy;  Laterality: N/A;  . COLONOSCOPY WITH PROPOFOL N/A 01/27/2017   Procedure: COLONOSCOPY WITH PROPOFOL;  Surgeon: Lucilla Lame, MD;  Location: [facility] ENDOSCOPY;  Service: Endoscopy;  Laterality: N/A;  . CORONARY ANGIOPLASTY     w/ stent placement x2  . CORONARY ARTERY BYPASS GRAFT  2012   Dr [name]  . CORONARY STENT PLACEMENT  2003   S/P MI  . CORONARY STENT PLACEMENT  2007   [place]  . ESOPHAGOGASTRODUODENOSCOPY (EGD) WITH PROPOFOL N/A 02/06/2015   Procedure: ESOPHAGOGASTRODUODENOSCOPY (EGD) WITH PROPOFOL;  Surgeon: Lucilla Lame, MD;  Location: [facility] ENDOSCOPY;  Service: Endoscopy;  Laterality: N/A;  . ESOPHAGOGASTRODUODENOSCOPY (EGD) WITH PROPOFOL N/A 01/27/2017   Procedure: ESOPHAGOGASTRODUODENOSCOPY (EGD) WITH PROPOFOL;  Surgeon: Lucilla Lame, MD;  Location: [facility] ENDOSCOPY;  Service: Endoscopy;  Laterality: N/A;  . ESOPHAGOGASTRODUODENOSCOPY (EGD) WITH PROPOFOL N/A 09/01/2019   Procedure: ESOPHAGOGASTRODUODENOSCOPY (EGD) WITH PROPOFOL;  Surgeon: Lin Landsman, MD;  Location: [facility] ENDOSCOPY;  Service: Gastroenterology;  Laterality: N/A;  . FEMORAL ARTERY STENT  10/2010   right sided (Dr. Burt Knack)  . KNEE ARTHROSCOPY WITH MEDIAL MENISECTOMY Right 08/20/2017   Procedure: KNEE ARTHROSCOPY WITH MEDIAL  AND LATERAL MENISECTOMY;  Surgeon: Hessie Knows, MD;  Location: [facility] ORS;  Service: Orthopedics;  Laterality: Right;  . POSTERIOR CERVICAL  LAMINECTOMY N/A 03/08/2018   Procedure: POSTERIOR CERVICAL LAMINECTOMY-C7;  Surgeon: Deetta Perla, MD;  Location: [facility] ORS;  Service: Neurosurgery;  Laterality: N/A;  . TONSILLECTOMY    . TOTAL KNEE ARTHROPLASTY Right 04/14/2019   Procedure: RIGHT TOTAL KNEE ARTHROPLASTY;  Surgeon: Hessie Knows, MD;  Location: [facility] ORS;  Service: Orthopedics;  Laterality: Right;  . TUBAL LIGATION      Current Outpatient Medications:  .  albuterol (VENTOLIN HFA) 108 (90 Base) MCG/ACT inhaler, Inhale 2 puffs into the lungs every 6 (six) hours  as needed for wheezing or shortness of breath., Disp: 1 g, Rfl: 12 .  aspirin EC 81 MG tablet, Take 1 tablet (81 mg total) by mouth daily., Disp: , Rfl:  .  Azelastine HCl 0.15 % SOLN, Place 2 sprays into both nostrils daily as needed (allergies). , Disp: , Rfl:  .  dicyclomine (BENTYL) 10 MG capsule, Take 1 capsule (10 mg total) by mouth 3 (three) times daily before meals. As needed for abdominal pain, Disp: 120 capsule, Rfl: 11 .  esomeprazole (NEXIUM) 40 MG capsule, Take 1 capsule (40 mg total) by mouth daily. Reported on 09/04/2015, Disp: 90 capsule, Rfl: 3 .  Evolocumab (REPATHA SURECLICK) [id number] MG/ML SOAJ, Inject 1 pen into the skin every 14 (fourteen) days., Disp: 2 pen, Rfl: 10 .  ezetimibe (ZETIA) 10 MG tablet, Take 1 tablet (10 mg total) by mouth daily., Disp: 90 tablet, Rfl: 3 .  fluticasone (FLONASE) 50 MCG/ACT nasal spray, Place 2 sprays into both nostrils daily as needed for allergies. , Disp: , Rfl:  .  fluticasone (FLOVENT HFA) 110 MCG/ACT inhaler, Inhale 1 puff into the lungs 2 (two) times a day. Rinse mouth, Disp: 1 Inhaler, Rfl: 12 .  furosemide (LASIX) 20 MG tablet, TAKE 1 TABLET BY MOUTH ONCE DAILY AND A SECOND TABLET IF NEEDED FOR FLUID BUILD UP., Disp: 180 tablet, Rfl: 0 .  glucose blood (ONE TOUCH ULTRA TEST) test strip, Use as instructed check cbg qd E11.9, Disp: 100 each, Rfl: 12 .  hydrOXYzine (ATARAX/VISTARIL) 25 MG tablet, Take 1-2 tablets (25-50 mg  total) by mouth daily as needed., Disp: 60 tablet, Rfl: 2 .  ipratropium-albuterol (DUONEB) 0.5-2.5 (3) MG/3ML SOLN, Take 3 mLs by nebulization 2 (two) times daily as needed (wheezing/shortness of breath)., Disp: 360 mL, Rfl: 11 .  isosorbide mononitrate (IMDUR) 30 MG 24 hr tablet, Take 1 tablet by mouth twice daily, Disp: 180 tablet, Rfl: 0 .  Lactobacillus (ACIDOPHILUS PROBIOTIC) 10 MG CAPS, Take 1 capsule by mouth daily., Disp: 30 capsule, Rfl: 2 .  Lancets MISC, 1 Device by Does not apply route daily. Lancets E11.9, Disp: 90 each, Rfl: 3 .  levocetirizine (XYZAL) 5 MG tablet, Take 1 tablet (5 mg total) by mouth at bedtime as needed for allergies., Disp: 90 tablet, Rfl: 3 .  linaclotide (LINZESS) 72 MCG capsule, Take 1 capsule (72 mcg total) by mouth daily before breakfast., Disp: 90 capsule, Rfl: 3 .  losartan (COZAAR) 25 MG tablet, Take 1 tablet (25 mg total) by mouth daily. (Patient taking differently: Take 25 mg by mouth every evening. ), Disp: 90 tablet, Rfl: 3 .  meclizine (ANTIVERT) 25 MG tablet, Take 25 mg by mouth 3 (three) times daily. , Disp: , Rfl: 3 .  metFORMIN (GLUCOPHAGE) 500 MG tablet, Take 1 tablet (500 mg total) by mouth daily with breakfast., Disp: 90 tablet, Rfl: 3 .  methocarbamol (ROBAXIN) 500 MG tablet, Take 1 tablet (500 mg total) by mouth every 6 (six) hours as needed for muscle spasms., Disp: 30 tablet, Rfl: 0 .  metoprolol succinate (TOPROL-XL) 50 MG 24 hr tablet, Take 1 tablet (50 mg total) by mouth daily. Take with or immediately following a meal., Disp: 90 tablet, Rfl: 3 .  mirabegron ER (MYRBETRIQ) 25 MG TB24 tablet, Take 1 tablet (25 mg total) by mouth daily. (Patient taking differently: Take 25 mg by mouth daily as needed (urinary frequency). ), Disp: 30 tablet, Rfl: 11 .  nitroGLYCERIN (NITROSTAT) 0.4 MG SL tablet, Place 1 tablet (0.4  mg total) under the tongue every 5 (five) minutes as needed., Disp: 25 tablet, Rfl: 6 .  ondansetron (ZOFRAN) 4 MG tablet, Take 1  tablet (4 mg total) by mouth every 8 (eight) hours as needed for nausea or vomiting., Disp: 20 tablet, Rfl: 0 .  PARoxetine (PAXIL) 20 MG tablet, Take 1 tablet (20 mg total) by mouth daily., Disp: 90 tablet, Rfl: 3 .  potassium chloride (KLOR-CON) 10 MEQ tablet, Take 1 tablet (10 mEq total) by mouth daily., Disp: 90 tablet, Rfl: 3 .  rosuvastatin (CRESTOR) 20 MG tablet, Take 1 tablet (20 mg total) by mouth at bedtime., Disp: 90 tablet, Rfl: 3 .  umeclidinium-vilanterol (ANORO ELLIPTA) 62.5-25 MCG/INH AEPB, Inhale 1 puff into the lungs daily., Disp: 60 each, Rfl: 12 .  XARELTO 2.5 MG TABS tablet, Take 1 tablet by mouth twice daily, Disp: 180 tablet, Rfl: 1    Family History  Problem Relation Age of Onset  . Hypertension Father   . Heart failure Father   . Diabetes Father   . Colon cancer Father 71  . Glaucoma Father   . Cancer Father        colorectal   . Heart disease Father   . Other Father        glaucoma  . Breast cancer Mother 12       breast cancer, late 82's  . Cancer Mother        breast  . Brain cancer Sister   . Arthritis Sister   . Diabetes Sister   . Hypertension Sister   . Kidney disease Sister   . Diabetes Brother   . Hypertension Brother   . Other Sister        brain tumor   . Acute myelogenous leukemia Grandson        06/2019  . Coronary artery disease Neg Hx   . Stroke Neg Hx      Social History   Tobacco Use  . Smoking status: Former Smoker    Packs/day: 1.00    Years: 38.00    Pack years: 38.00    Types: Cigarettes    Quit date: 08/12/2010    Years since quitting: 9.2  . Smokeless tobacco: Never Used  Substance Use Topics  . Alcohol use: No  . Drug use: No    Types: Cocaine    Comment: Remote Hx (crack cocaine and marijuana).none since 47yrs plus    Allergies as of 11/15/2019 - Review Complete 11/15/2019  Allergen Reaction Noted  . Latex Rash 08/23/2010  . Shellfish allergy Anaphylaxis   . Sulfonamide derivatives Anaphylaxis 08/23/2010    . Watermelon [citrullus vulgaris] Other (See Comments) 04/13/2017  . Soy allergy  03/10/2019  . Other Swelling 01/20/2012    Review of Systems:    All systems reviewed and negative except where noted in HPI.   Physical Exam:  BP 111/76 (BP Location: Left Arm, Patient Position: Sitting, Cuff Size: Normal)   Pulse 80   Temp 97.8 F (36.6 C) (Oral)   Wt 206 lb (93.4 kg)   BMI 34.28 kg/m  No LMP recorded. Patient has had a hysterectomy.  General:   Alert,  Well-developed, well-nourished, pleasant and cooperative in mild distress due to pain Head:  Normocephalic and atraumatic. Eyes:  Sclera clear, no icterus.   Conjunctiva pink. Ears:  Normal auditory acuity. Nose:  No deformity, discharge, or lesions. Mouth:  No deformity or lesions,oropharynx pink & moist. Neck:  Supple; no masses or thyromegaly. Lungs:  Respirations even and unlabored.  Clear throughout to auscultation.   No wheezes, crackles, or rhonchi. No acute distress. Heart:  Regular rate and rhythm; no murmurs, clicks, rubs, or gallops. Abdomen:  Normal bowel sounds. Soft, mild tenderness in the left upper quadrant, mild diffuse tenderness in lower abdomen and non-distended without masses, mild epigastric tenderness hepatosplenomegaly or hernias noted.  no rebound tenderness.   Rectal: Not performed Msk:  Symmetrical without gross deformities. Good, equal movement & strength bilaterally. Pulses:  Normal pulses noted. Extremities:  No clubbing or edema.  No cyanosis. Neurologic:  Alert and oriented x3;  grossly normal neurologically. Skin:  Intact without significant lesions or rashes. No jaundice. Psych:  Alert and cooperative. Normal mood and affect.  Imaging Studies: Reviewed  Assessment and Plan:   Alexina Kerestes is a 59 y.o. female with metabolic syndrome, chronic constipation, history of gastric ulcers, H. pylori negative, chronic left-sided abdominal pain. EGD is unremarkable for peptic ulcer   Chronic  constipation and left lower quadrant pain History of sigmoid diverticulosis Reiterated on importance of high-fiber diet, adequate intake of water and fiber supplements as needed Continue Linzess 72 MCG daily Discussed about fiber supplements different choices like Fiberchoice, Metamucil etc. Continue to eliminate carbonated beverages, cut back on regular intake of red meat  Epigastric pain, reflux: Recent stress could be the potential trigger Continue Nexium 40 mg daily before breakfast and increase to twice daily if needed Continue antireflux lifestyle  Tubular adenomas of colon Recommend surveillance colonoscopy in 01/2022  Follow-up in 4 to 6 months   Cephas Darby, MD

## 2019-11-16 ENCOUNTER — Other Ambulatory Visit: Payer: Self-pay | Admitting: Student

## 2019-11-16 DIAGNOSIS — R29898 Other symptoms and signs involving the musculoskeletal system: Secondary | ICD-10-CM

## 2019-11-16 DIAGNOSIS — M542 Cervicalgia: Secondary | ICD-10-CM

## 2019-11-17 DIAGNOSIS — I259 Chronic ischemic heart disease, unspecified: Secondary | ICD-10-CM | POA: Diagnosis not present

## 2019-11-17 DIAGNOSIS — G4733 Obstructive sleep apnea (adult) (pediatric): Secondary | ICD-10-CM | POA: Diagnosis not present

## 2019-11-17 DIAGNOSIS — G4710 Hypersomnia, unspecified: Secondary | ICD-10-CM | POA: Diagnosis not present

## 2019-11-23 ENCOUNTER — Other Ambulatory Visit: Payer: Self-pay | Admitting: Cardiovascular Disease

## 2019-11-25 ENCOUNTER — Other Ambulatory Visit (HOSPITAL_COMMUNITY): Payer: Self-pay | Admitting: Cardiovascular Disease

## 2019-11-25 DIAGNOSIS — Z959 Presence of cardiac and vascular implant and graft, unspecified: Secondary | ICD-10-CM

## 2019-11-25 DIAGNOSIS — I739 Peripheral vascular disease, unspecified: Secondary | ICD-10-CM

## 2019-12-02 ENCOUNTER — Ambulatory Visit (INDEPENDENT_AMBULATORY_CARE_PROVIDER_SITE_OTHER): Payer: Medicare HMO | Admitting: Internal Medicine

## 2019-12-02 ENCOUNTER — Encounter: Payer: Self-pay | Admitting: Internal Medicine

## 2019-12-02 ENCOUNTER — Other Ambulatory Visit: Payer: Self-pay

## 2019-12-02 ENCOUNTER — Other Ambulatory Visit (HOSPITAL_COMMUNITY)
Admission: RE | Admit: 2019-12-02 | Discharge: 2019-12-02 | Disposition: A | Discharge status: Home / Self Care | Payer: Medicare HMO | Source: Ambulatory Visit | Attending: Internal Medicine | Admitting: Internal Medicine

## 2019-12-02 VITALS — BP 100/70 | HR 76 | Temp 97.4°F | Ht 65.0 in | Wt 206.8 lb

## 2019-12-02 DIAGNOSIS — R200 Anesthesia of skin: Secondary | ICD-10-CM

## 2019-12-02 DIAGNOSIS — M869 Osteomyelitis, unspecified: Secondary | ICD-10-CM | POA: Insufficient documentation

## 2019-12-02 DIAGNOSIS — N760 Acute vaginitis: Secondary | ICD-10-CM

## 2019-12-02 DIAGNOSIS — M5412 Radiculopathy, cervical region: Secondary | ICD-10-CM

## 2019-12-02 DIAGNOSIS — G629 Polyneuropathy, unspecified: Secondary | ICD-10-CM

## 2019-12-02 DIAGNOSIS — R202 Paresthesia of skin: Secondary | ICD-10-CM

## 2019-12-02 MED ORDER — CYCLOBENZAPRINE HCL 5 MG PO TABS: 5.0000 mg | ORAL_TABLET | Freq: Every evening | ORAL | 11 refills | Status: DC | PRN

## 2019-12-02 MED ORDER — PREDNISONE 20 MG PO TABS: 40.0000 mg | ORAL_TABLET | Freq: Every day | ORAL | 0 refills | Status: DC

## 2019-12-02 NOTE — Patient Instructions (Addendum)
COVID-19 Vaccine Information can be found at: [email or web address] For questions related to vaccine distribution or appointments, please email [email or web address] or call [phone].  Pfizer vaccine   Wait 2 weeks after steroids  [facility]    Lidocaine pain patch place in groin area Heat  Tylenol   Hip Exercises Ask your health care provider which exercises are safe for you. Do exercises exactly as told by your health care provider and adjust them as directed. It is normal to feel mild stretching, pulling, tightness, or discomfort as you do these exercises. Stop right away if you feel sudden pain or your pain gets worse. Do not begin these exercises until told by your health care provider. Stretching and range-of-motion exercises These exercises warm up your muscles and joints and improve the movement and flexibility of your hip. These exercises also help to relieve pain, numbness, and tingling. You may be asked to limit your range of motion if you had a hip replacement. Talk to your health care provider about these restrictions. Hamstrings, supine  1. Lie on your back (supine position). 2. Loop a belt or towel over the ball of your left / right foot. The ball of your foot is on the walking surface, right under your toes. 3. Straighten your left / right knee and slowly pull on the belt or towel to raise your leg until you feel a gentle stretch behind your knee (hamstring). ? Do not let your knee bend while you do this. ? Keep your other leg flat on the floor. 4. Hold this position for __________ seconds. 5. Slowly return your leg to the starting position. Repeat __________ times. Complete this exercise __________ times a day. Hip rotation  1. Lie on your back on a firm surface. 2. With your left / right hand, gently pull your left / right knee toward the shoulder that is on the same side of the body. Stop when your knee is  pointing toward the ceiling. 3. Hold your left / right ankle with your other hand. 4. Keeping your knee steady, gently pull your left / right ankle toward your other shoulder until you feel a stretch in your buttocks. ? Keep your hips and shoulders firmly planted while you do this stretch. 5. Hold this position for __________ seconds. Repeat __________ times. Complete this exercise __________ times a day. Seated stretch This exercise is sometimes called hamstrings and adductors stretch. 1. Sit on the floor with your legs stretched wide. Keep your knees straight during this exercise. 2. Keeping your head and back in a straight line, bend at your waist to reach for your left foot (position A). You should feel a stretch in your right inner thigh (adductors). 3. Hold this position for __________ seconds. Then slowly return to the upright position. 4. Keeping your head and back in a straight line, bend at your waist to reach forward (position B). You should feel a stretch behind both of your thighs and knees (hamstrings). 5. Hold this position for __________ seconds. Then slowly return to the upright position. 6. Keeping your head and back in a straight line, bend at your waist to reach for your right foot (position C). You should feel a stretch in your left inner thigh (adductors). 7. Hold this position for __________ seconds. Then slowly return to the upright position. Repeat __________ times. Complete this exercise __________ times a day. Lunge This exercise stretches the muscles of the hip (hip flexors). 1. Place your left / right knee on the floor  and bend your other knee so that is directly over your ankle. You should be half-kneeling. 2. Keep good posture with your head over your shoulders. 3. Tighten your buttocks to point your tailbone downward. This will prevent your back from arching too much. 4. You should feel a gentle stretch in the front of your left / right thigh and hip. If you do  not feel a stretch, slide your other foot forward slightly and then slowly lunge forward with your chest up until your knee once again lines up over your ankle. ? Make sure your tailbone continues to point downward. 5. Hold this position for __________ seconds. 6. Slowly return to the starting position. Repeat __________ times. Complete this exercise __________ times a day. Strengthening exercises These exercises build strength and endurance in your hip. Endurance is the ability to use your muscles for a long time, even after they get tired. Bridge This exercise strengthens the muscles of your hip (hip extensors). 1. Lie on your back on a firm surface with your knees bent and your feet flat on the floor. 2. Tighten your buttocks muscles and lift your bottom off the floor until the trunk of your body and your hips are level with your thighs. ? Do not arch your back. ? You should feel the muscles working in your buttocks and the back of your thighs. If you do not feel these muscles, slide your feet 1-2 inches (2.5-5 cm) farther away from your buttocks. 3. Hold this position for __________ seconds. 4. Slowly lower your hips to the starting position. 5. Let your muscles relax completely between repetitions. Repeat __________ times. Complete this exercise __________ times a day. Straight leg raises, side-lying This exercise strengthens the muscles that move the hip joint away from the center of the body (hip abductors). 1. Lie on your side with your left / right leg in the top position. Lie so your head, shoulder, hip, and knee line up. You may bend your bottom knee slightly to help you balance. 2. Roll your hips slightly forward, so your hips are stacked directly over each other and your left / right knee is facing forward. 3. Leading with your heel, lift your top leg 4-6 inches (10-15 cm). You should feel the muscles in your top hip lifting. ? Do not let your foot drift forward. ? Do not let your  knee roll toward the ceiling. 4. Hold this position for __________ seconds. 5. Slowly return to the starting position. 6. Let your muscles relax completely between repetitions. Repeat __________ times. Complete this exercise __________ times a day. Straight leg raises, side-lying This exercise strengthens the muscles that move the hip joint toward the center of the body (hip adductors). 1. Lie on your side with your left / right leg in the bottom position. Lie so your head, shoulder, hip, and knee line up. You may place your upper foot in front to help you balance. 2. Roll your hips slightly forward, so your hips are stacked directly over each other and your left / right knee is facing forward. 3. Tense the muscles in your inner thigh and lift your bottom leg 4-6 inches (10-15 cm). 4. Hold this position for __________ seconds. 5. Slowly return to the starting position. 6. Let your muscles relax completely between repetitions. Repeat __________ times. Complete this exercise __________ times a day. Straight leg raises, supine This exercise strengthens the muscles in the front of your thigh (quadriceps). 1. Lie on your back (supine position)  with your left / right leg extended and your other knee bent. 2. Tense the muscles in the front of your left / right thigh. You should see your kneecap slide up or see increased dimpling just above your knee. 3. Keep these muscles tight as you raise your leg 4-6 inches (10-15 cm) off the floor. Do not let your knee bend. 4. Hold this position for __________ seconds. 5. Keep these muscles tense as you lower your leg. 6. Relax the muscles slowly and completely between repetitions. Repeat __________ times. Complete this exercise __________ times a day. Hip abductors, standing This exercise strengthens the muscles that move the leg and hip joint away from the center of the body (hip abductors). 1. Tie one end of a rubber exercise band or tubing to a secure  surface, such as a chair, table, or pole. 2. Loop the other end of the band or tubing around your left / right ankle. 3. Keeping your ankle with the band or tubing directly opposite the secured end, step away until there is tension in the tubing or band. Hold on to a chair, table, or pole as needed for balance. 4. Lift your left / right leg out to your side. While you do this: ? Keep your back upright. ? Keep your shoulders over your hips. ? Keep your toes pointing forward. ? Make sure to use your hip muscles to slowly lift your leg. Do not tip your body or forcefully lift your leg. 5. Hold this position for __________ seconds. 6. Slowly return to the starting position. Repeat __________ times. Complete this exercise __________ times a day. Squats This exercise strengthens the muscles in the front of your thigh (quadriceps). 1. Stand in a door frame so your feet and knees are in line with the frame. You may place your hands on the frame for balance. 2. Slowly bend your knees and lower your hips like you are going to sit in a chair. ? Keep your lower legs in a straight-up-and-down position. ? Do not let your hips go lower than your knees. ? Do not bend your knees lower than told by your health care provider. ? If your hip pain increases, do not bend as low. 3. Hold this position for ___________ seconds. 4. Slowly push with your legs to return to standing. Do not use your hands to pull yourself to standing. Repeat __________ times. Complete this exercise __________ times a day. This information is not intended to replace advice given to you by your health care provider. Make sure you discuss any questions you have with your health care provider. Document Revised: 02/17/2019 Document Reviewed: 05/25/2018 [employer] Patient Education  [place].

## 2019-12-02 NOTE — Progress Notes (Signed)
Chief Complaint  Patient presents with   Groin Pain    pain, pressure, and swelling in the vaginal pelvic area. Started a week ago and getting worse. Pain 10/10 with visable limp and difficulty standing and sitting.    F/u  1. 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 Right hand numbness/tingling trouble s/p cervical neck surgery  closing hand and dropping objects prior EMG 02/24/18 mid cervical and right mild low cervical polyneuropathy likely needs EMG with [facility] neurology MRI cervical 12/04/19 scheduled   3. H/o BV with  Possible sx's discomfort with urination denies intercourse   Review of Systems  Constitutional: Negative for weight loss.  Respiratory: Negative for shortness of breath.   Cardiovascular: Negative for chest pain.  Musculoskeletal: Positive for back pain, joint pain and neck pain. Negative for falls.  Neurological: Positive for sensory change.   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 being followed by neurosurgery/eye doctor and pcp. monitoring size.causes sinus problems   Bronchitis    Cardiac arrhythmia    Cervical neck pain with evidence of disc disease    C5/6 disease, MRI done late 2012 - no records available   Chronic combined systolic and diastolic CHF (congestive heart failure) ([place])    a. 08/2010 Echo: mildly reduced EF 40-45%, mild diffuse hypokinesis; b. 06/2016 Echo: EF 50%, no rwma, mild to mod TR; c. 03/2017 Echo: EF 40-45%, Gr1 DD (prior echo reviewed and EF felt to be lower than reported).    Chronic pain    Cocaine abuse, in remission (HCC)    clean x 24 years   Colon polyps    COPD (chronic obstructive pulmonary disease) ([place])    a. 12/2018 PFT: No obvious obst/restrictive dzs.   Coronary artery disease    a. PCI of LCX 2003; b. PCI of the LAD 2012 with a (2.5 x 8 mm BMS);  c.s/p CABG 4/12: L-LAD, VG-Dx, VG-OM, VG-RCA (Dr. Prescott Gum);  d. 01/2012 MV: inf infarct, attenuation, no ischemia; e. 02/2017 MV: signifi attenuation artifact. Fixed basal antlat/inflat scar vs artifact. Reversible apical lat and mid antlat defect - ? atten vs ischemia. F/u echo w/o wma->Med rx.   Depression    Diabetes mellitus without complication ([place])    Family history of colon cancer    Generalized headaches    frequent   GERD (gastroesophageal reflux disease)    Headache    Heart murmur    Hematochezia    Hepatitis    history of hepatitis b   History of cervical cancer    s/p cryotherapy   History of drug abuse ([place])    cocaine, marijuana, clean since 1989   History of hepatitis B    from eating undercooked liver   History of MI (myocardial infarction)    Hyperlipidemia    Hypertension    Ischemic cardiomyopathy    a. 03/2017 Echo: EF 40-45%, Gr1 DD.   Myocardial infarction ([place])  2003, 2012   PAD (peripheral artery disease) ([place])    a. s/p Right SFA atherectomy and PTA 01/15/11; b. 07/2018 ABI: R 1.02, L 1.09.   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 12/2018 MRI Brain: Stable pituitary mass w/ 82mm area of necrosis. Mass abuts R cavernous sinus w/o definite invasion.   Polyp of colon    Seasonal allergies    Sleep apnea    uses cpap   Smoking history    quit 07/2010   Thyroid disease    Urinary incontinence    Past Surgical History:  Procedure Laterality Date   ABDOMINAL HYSTERECTOMY     2003   CHOLECYSTECTOMY  1986   COLONOSCOPY  2008   3 polyps   COLONOSCOPY WITH PROPOFOL N/A 02/06/2015   Procedure: COLONOSCOPY WITH PROPOFOL;  Surgeon: Lucilla Lame, MD;   Location: [facility] ENDOSCOPY;  Service: Endoscopy;  Laterality: N/A;   COLONOSCOPY WITH PROPOFOL N/A 01/27/2017   Procedure: COLONOSCOPY WITH PROPOFOL;  Surgeon: Lucilla Lame, MD;  Location: [facility] ENDOSCOPY;  Service: Endoscopy;  Laterality: N/A;   CORONARY ANGIOPLASTY     w/ stent placement x2   CORONARY ARTERY BYPASS GRAFT  2012   Dr [name]   CORONARY STENT PLACEMENT  2003   S/P MI   CORONARY STENT PLACEMENT  2007   [place]   ESOPHAGOGASTRODUODENOSCOPY (EGD) WITH PROPOFOL N/A 02/06/2015   Procedure: ESOPHAGOGASTRODUODENOSCOPY (EGD) WITH PROPOFOL;  Surgeon: Lucilla Lame, MD;  Location: [facility] ENDOSCOPY;  Service: Endoscopy;  Laterality: N/A;   ESOPHAGOGASTRODUODENOSCOPY (EGD) WITH PROPOFOL N/A 01/27/2017   Procedure: ESOPHAGOGASTRODUODENOSCOPY (EGD) WITH PROPOFOL;  Surgeon: Lucilla Lame, MD;  Location: [facility] ENDOSCOPY;  Service: Endoscopy;  Laterality: N/A;   ESOPHAGOGASTRODUODENOSCOPY (EGD) WITH PROPOFOL N/A 09/01/2019   Procedure: ESOPHAGOGASTRODUODENOSCOPY (EGD) WITH PROPOFOL;  Surgeon: Lin Landsman, MD;  Location: [place];  Service: Gastroenterology;  Laterality: N/A;   FEMORAL ARTERY STENT  10/2010   right sided (Dr. Burt Knack)   KNEE ARTHROSCOPY WITH MEDIAL MENISECTOMY Right 08/20/2017   Procedure: KNEE ARTHROSCOPY WITH MEDIAL  AND LATERAL MENISECTOMY;  Surgeon: Hessie Knows, MD;  Location: [facility] ORS;  Service: Orthopedics;  Laterality: Right;   POSTERIOR CERVICAL LAMINECTOMY N/A 03/08/2018   Procedure: POSTERIOR CERVICAL LAMINECTOMY-C7;  Surgeon: Deetta Perla, MD;  Location: [facility] ORS;  Service: Neurosurgery;  Laterality: N/A;   TONSILLECTOMY     TOTAL KNEE ARTHROPLASTY Right 04/14/2019   Procedure: RIGHT TOTAL KNEE ARTHROPLASTY;  Surgeon: Hessie Knows, MD;  Location: [facility] ORS;  Service: Orthopedics;  Laterality: Right;   TUBAL LIGATION     Family History  Problem Relation Age of Onset   Hypertension Father    Heart failure Father    Diabetes Father    Colon cancer  Father 36   Glaucoma Father    Cancer Father        colorectal    Heart disease Father    Other Father        glaucoma   Breast cancer Mother 31       breast cancer, late 73's   Cancer Mother        breast   Brain cancer Sister    Arthritis Sister    Diabetes Sister    Hypertension Sister    Kidney disease Sister    Diabetes Brother    Hypertension Brother    Other Sister        brain tumor    Acute myelogenous leukemia Grandson        06/2019   Coronary  artery disease Neg Hx    Stroke Neg Hx    Social History   Socioeconomic History   Marital status: Legally Separated    Spouse name: Not on file   Number of children: Not on file   Years of education: Not on file   Highest education level: Not on file  Occupational History   Not on file  Tobacco Use   Smoking status: Former Smoker    Packs/day: 1.00    Years: 38.00    Pack years: 38.00    Types: Cigarettes    Quit date: 08/12/2010    Years since quitting: 9.3   Smokeless tobacco: Never Used  Substance and Sexual Activity   Alcohol use: No   Drug use: No    Types: Cocaine    Comment: Remote Hx (crack cocaine and marijuana).none since 75yrs plus   Sexual activity: Yes  Other Topics Concern   Not on file  Social History Narrative   Caffeine: 1 cup coffee/day   Lives with family, no pets   Occupation: industrial work, 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   Edu: 11th grade   Activity: no regular exercise   Diet: good water, vegetables daily, low salt diet   Lives with sister and other family    No guns, wears seat belts, safe in relationship    2 kids    GED 1 year of college    Social Determinants of Health   Financial Resource Strain: Low Risk    Difficulty of Paying Living Expenses: Not hard at all  Food Insecurity: No Food Insecurity   Worried About Charity fundraiser in the Last Year: Never true   Arboriculturist in the Last Year: Never true  Transportation Needs: No  Transportation Needs   Lack of Transportation (Medical): No   Lack of Transportation (Non-Medical): No  Physical Activity:    Days of Exercise per Week:    Minutes of Exercise per Session:   Stress: No Stress Concern Present   Feeling of Stress : Not at all  Social Connections: Unknown   Frequency of Communication with Friends and Family: More than three times a week   Frequency of Social Gatherings with Friends and Family: Once a week   Attends Religious Services: 1 to 4 times per year   Active Member of [employer] or Organizations: Yes   Attends Music therapist: More than 4 times per year   Marital Status: Not on file  Intimate Partner Violence: Not At Risk   Fear of Current or Ex-Partner: No   Emotionally Abused: No   Physically Abused: No   Sexually Abused: No   Current Meds  Medication Sig   albuterol (VENTOLIN HFA) 108 (90 Base) MCG/ACT inhaler Inhale 2 puffs into the lungs every 6 (six) hours as needed for wheezing or shortness of breath.   aspirin EC 81 MG tablet Take 1 tablet (81 mg total) by mouth daily.   Azelastine HCl 0.15 % SOLN Place 2 sprays into both nostrils daily as needed (allergies).    dicyclomine (BENTYL) 10 MG capsule Take 1 capsule (10 mg total) by mouth 3 (three) times daily before meals. As needed for abdominal pain   esomeprazole (NEXIUM) 40 MG capsule Take 1 capsule (40 mg total) by mouth daily. Reported on 09/04/2015   Evolocumab (REPATHA SURECLICK) [id number] MG/ML SOAJ Inject 1 pen into the skin every 14 (fourteen) days.   ezetimibe (ZETIA) 10 MG tablet Take 1 tablet (10 mg  total) by mouth daily.   fluticasone (FLONASE) 50 MCG/ACT nasal spray Place 2 sprays into both nostrils daily as needed for allergies.    fluticasone (FLOVENT HFA) 110 MCG/ACT inhaler Inhale 1 puff into the lungs 2 (two) times a day. Rinse mouth   furosemide (LASIX) 20 MG tablet TAKE 1 TABLET BY MOUTH ONCE DAILY AND A SECOND TABLET IF NEEDED FOR FLUID BUILD  UP   glucose blood (ONE TOUCH ULTRA TEST) test strip Use as instructed check cbg qd E11.9   hydrOXYzine (ATARAX/VISTARIL) 25 MG tablet Take 1-2 tablets (25-50 mg total) by mouth daily as needed.   ipratropium-albuterol (DUONEB) 0.5-2.5 (3) MG/3ML SOLN Take 3 mLs by nebulization 2 (two) times daily as needed (wheezing/shortness of breath).   isosorbide mononitrate (IMDUR) 30 MG 24 hr tablet Take 1 tablet by mouth twice daily   Lancets MISC 1 Device by Does not apply route daily. Lancets E11.9   levocetirizine (XYZAL) 5 MG tablet Take 1 tablet (5 mg total) by mouth at bedtime as needed for allergies.   linaclotide (LINZESS) 72 MCG capsule Take 1 capsule (72 mcg total) by mouth daily before breakfast.   losartan (COZAAR) 25 MG tablet Take 1 tablet (25 mg total) by mouth daily. (Patient taking differently: Take 25 mg by mouth every evening. )   meclizine (ANTIVERT) 25 MG tablet Take 25 mg by mouth 3 (three) times daily.    metFORMIN (GLUCOPHAGE) 500 MG tablet Take 1 tablet (500 mg total) by mouth daily with breakfast.   methocarbamol (ROBAXIN) 500 MG tablet Take 1 tablet (500 mg total) by mouth every 6 (six) hours as needed for muscle spasms.   metoprolol succinate (TOPROL-XL) 50 MG 24 hr tablet Take 1 tablet (50 mg total) by mouth daily. Take with or immediately following a meal.   mirabegron ER (MYRBETRIQ) 25 MG TB24 tablet Take 1 tablet (25 mg total) by mouth daily. (Patient taking differently: Take 25 mg by mouth daily as needed (urinary frequency). )   nitroGLYCERIN (NITROSTAT) 0.4 MG SL tablet Place 1 tablet (0.4 mg total) under the tongue every 5 (five) minutes as needed.   ondansetron (ZOFRAN) 4 MG tablet Take 1 tablet (4 mg total) by mouth every 8 (eight) hours as needed for nausea or vomiting.   PARoxetine (PAXIL) 20 MG tablet Take 1 tablet (20 mg total) by mouth daily.   potassium chloride (KLOR-CON) 10 MEQ tablet Take 1 tablet (10 mEq total) by mouth daily.   rosuvastatin  (CRESTOR) 20 MG tablet Take 1 tablet (20 mg total) by mouth at bedtime.   umeclidinium-vilanterol (ANORO ELLIPTA) 62.5-25 MCG/INH AEPB Inhale 1 puff into the lungs daily.   XARELTO 2.5 MG TABS tablet Take 1 tablet by mouth twice daily   [DISCONTINUED] baclofen (LIORESAL) 10 MG tablet Take 10 mg by mouth 3 (three) times daily.   Allergies  Allergen Reactions   Latex Rash        Shellfish Allergy Anaphylaxis    Hard shellfish/swelling in throat   Sulfonamide Derivatives Anaphylaxis    Swelling in throat.   Watermelon [Citrullus Vulgaris] Other (See Comments)    Throat itchy   Soy Allergy     Diarrhea/upset stomach     Other Swelling   Recent Results (from the past 2160 hour(s))  Microalbumin / creatinine urine ratio     Status: Abnormal   Collection Time: 09/09/19 10:33 AM  Result Value Ref Range   Creatinine, Urine 9 (L) 20 - 275 mg/dL   Microalb, Ur <  0.2 mg/dL    Comment: Reference Range Not established    Microalb Creat Ratio NOTE <30 mcg/mg creat    Comment: NOTE: The urine albumin value is less than  0.2 mg/dL therefore we are unable to calculate  excretion and/or creatinine ratio. . The ADA defines abnormalities in albumin excretion as follows: [name] Category         Result (mcg/mg creatinine) . Normal                    <30 Microalbuminuria         30-299  Clinical albuminuria   > OR = 300 . The ADA recommends that at least two of three specimens collected within a 3-6 month period be abnormal before considering a patient to be within a diagnostic category.   Iron, TIBC and Ferritin Panel     Status: None   Collection Time: 09/09/19 10:33 AM  Result Value Ref Range   Iron 62 45 - 160 mcg/dL   TIBC 357 250 - 450 mcg/dL (calc)   %SAT 17 16 - 45 % (calc)   Ferritin 18 16 - 288 ng/mL  HgB A1c     Status: Abnormal   Collection Time: 09/09/19 10:33 AM  Result Value Ref Range   Hgb A1c MFr Bld 6.7 (H) 4.6 - 6.5 %    Comment: Glycemic Control Guidelines  for People with Diabetes:Non Diabetic:  <6%Goal of Therapy: <7%Additional Action Suggested:  >8%   Lipid panel     Status: None   Collection Time: 09/09/19 10:33 AM  Result Value Ref Range   Cholesterol 98 0 - 200 mg/dL    Comment: ATP III Classification       Desirable:  < 200 mg/dL               Borderline High:  200 - 239 mg/dL          High:  > = 240 mg/dL   Triglycerides 72.0 0.0 - 149.0 mg/dL    Comment: Normal:  <150 mg/dLBorderline High:  150 - 199 mg/dL   HDL 56.40 >39.00 mg/dL   VLDL 14.4 0.0 - 40.0 mg/dL   LDL Cholesterol 27 0 - 99 mg/dL   Total CHOL/HDL Ratio 2     Comment:                Men          Women1/2 Average Risk     3.4          3.3Average Risk          5.0          4.42X Average Risk          9.6          7.13X Average Risk          15.0          11.0                       NonHDL 41.14     Comment: NOTE:  Non-HDL goal should be 30 mg/dL higher than patient's LDL goal (i.e. LDL goal of < 70 mg/dL, would have non-HDL goal of < 100 mg/dL)   Objective  Body mass index is 34.41 kg/m. Wt Readings from Last 3 Encounters:  12/02/19 206 lb 12.8 oz (93.8 kg)  11/15/19 206 lb (93.4 kg)  10/13/19 198 lb 8 oz (90 kg)   Temp Readings  from Last 3 Encounters:  12/02/19 (!) 97.4 F (36.3 C) (Temporal)  11/15/19 97.8 F (36.6 C) (Oral)  09/01/19 97.6 F (36.4 C) (Tympanic)   BP Readings from Last 3 Encounters:  12/02/19 100/70  11/15/19 111/76  10/10/19 110/78   Pulse Readings from Last 3 Encounters:  12/02/19 76  11/15/19 80  10/10/19 72    Physical Exam Vitals and nursing note reviewed.  Constitutional:      Appearance: Normal appearance. She is well-developed and well-groomed. She is obese.  HENT:     Head: Normocephalic and atraumatic.  Cardiovascular:     Rate and Rhythm: Normal rate and regular rhythm.     Heart sounds: Normal heart sounds. No murmur.  Pulmonary:     Effort: Pulmonary effort is normal.     Breath sounds: Normal breath sounds.    Musculoskeletal:       Legs:  Skin:    General: Skin is warm and dry.  Neurological:     General: No focal deficit present.     Mental Status: She is alert and oriented to person, place, and time. Mental status is at baseline.     Gait: Gait normal.     Comments: Slowed gait today   Psychiatric:        Attention and Perception: Attention and perception normal.        Mood and Affect: Mood and affect normal.        Speech: Speech normal.        Behavior: Behavior normal. Behavior is cooperative.        Thought Content: Thought content normal.        Cognition and Memory: Cognition and memory normal.        Judgment: Judgment normal.     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[place]) - Plan: predniSONE (DELTASONE) 40 MG tablet x 1 week, Ambulatory referral to Orthopedic Surgery consider direct injection Given hip stretches  Disc otc lidocaine patches tylenol  Avoid nsaids 2/2 CAD  Acute vaginitis - Plan: Urine cytology ancillary only([place]) r/o BV recurrence   Cervical radiculopathy - Plan: cyclobenzaprine (FLEXERIL) 5 MG tablet, predniSONE (DELTASONE) 20 MG tablet, Ambulatory referral to Neurology for EMG/NCS right  MRI c spine pending 12/04/19  F/u with PM&R   Polyneuropathy - Plan: Ambulatory referral to Neurology  Numbness and tingling in right hand - Plan: Ambulatory referral to Neurology   HM Flu shotutd 10/8/20utd Considershingrix in futureat pharmacy pna 23 had 03/10/16 Considering covid vaccine   S/p hysterectomy no cervix and f/u OB/GYNh/o abnormal pap s/p cryo  -pap 05/08/15 neg papwill Repeat In 5 years  H/o BV  Colonoscopy Dr. Allen Norris 01/2017 polyps, gastritis, diverticulosis  Mammogram3/18/2020 negativere orderedsch 09/01/19 left dx mammogram sch 04/2020   DEXAneg 10/13/2018  Provider: Dr. Olivia Mackie [name]-Internal Medicine

## 2019-12-04 ENCOUNTER — Ambulatory Visit: Payer: Medicare HMO

## 2019-12-07 LAB — URINE CYTOLOGY ANCILLARY ONLY
Bacterial Vaginitis-Urine: NEGATIVE
Candida Urine: NEGATIVE

## 2019-12-14 NOTE — Progress Notes (Signed)
@Patient  ID: [name], female    DOB: 09-28-1960, 59 y.o.   MRN: [id number]  Chief Complaint  Patient presents with  . Follow-up    osa, chronic bronchitis     Referring provider: [name], Olivia Mackie *  HPI:  59 year old female former smoker followed in our office for COPD and moderate obstructive sleep apnea  PMH: Hyperlipidemia, GERD, depression, hypertension, morbid obesity, anxiety and depression Smoker/ Smoking History: Former Smoker. Quit 2012. 38 pack year smoker.  Maintenance: Anoro Ellipta   Pt of: Dr. Alva Garnet  12/15/2019  - Visit   59 year old female former smoker complaining follow-up with our office today.  She is followed in our office for chronic bronchitis and moderate obstructive sleep apnea.  Unfortunately she has not been compliant with using her CPAP as she is struggling to find the appropriate mask that is latex free.  She reports she is been working with her DME company [employer] regarding this.  She has not used her CPAP in over a month.  We will work on coordinating this today.  Patient was recently treated with a prednisone taper from primary care due to increased shortness of breath.  She remains adherent to Anoro Ellipta.  She is interested in obtaining the Covid vaccine.  Questionaires / Pulmonary Flowsheets:   ACT:  No flowsheet data found.  MMRC: mMRC Dyspnea Scale mMRC Score  12/15/2019 2    Epworth:  No flowsheet data found.  Tests:   05/06/16 PSG: AHI 6.8/hr (during REM 11.4/hr). Lowest SpO2 87%. Poor sleep efficiency (52.5%) 05/16/16 PFTs: normal spirometry, normal lung volumes, mild reduction in DLCO 06/09/16 CTA chest: ordered by Dr [name] for CP, dyspnea. Normal study. No PE 06/24/16 CPAP titration: optimal CPAP level 10 cm H2O 09/29/16-10/28/16 compliance report: 13/30 nights. > 4 hrs 13 nights 01/11/19 HST: Moderate OSA.  AHI 27/hour.  Mild sleep-related hypoxemia.  Recommended AutoSet CPAP 5-20 cm H2O  FENO:  No results  found for: NITRICOXIDE  PFT: PFT Results Latest Ref Rng & Units 05/16/2016  FVC-Pre L 2.68  FVC-Predicted Pre % 94  FVC-Post L 2.49  FVC-Predicted Post % 87  Pre FEV1/FVC % % 79  Post FEV1/FCV % % 79  FEV1-Pre L 2.11  FEV1-Predicted Pre % 94  FEV1-Post L 1.98  DLCO UNC% % 63  DLCO COR %Predicted % 72  TLC L 5.10  TLC % Predicted % 98  RV % Predicted % 115    WALK:  No flowsheet data found.  Imaging: No results found.  Lab Results:  CBC    Component Value Date/Time   WBC 6.1 08/25/2019 1018   RBC 4.86 08/25/2019 1018   HGB 14.1 08/25/2019 1018   HGB 12.8 02/13/2012 2319   HCT 45.0 08/25/2019 1018   HCT 38.3 02/13/2012 2319   PLT 205 08/25/2019 1018   PLT 144 (L) 02/13/2012 2319   MCV 92.6 08/25/2019 1018   MCV 94 02/13/2012 2319   MCH 29.0 08/25/2019 1018   MCHC 31.3 08/25/2019 1018   RDW 14.6 08/25/2019 1018   RDW 13.9 02/13/2012 2319   LYMPHSABS 2.8 08/25/2019 1018   MONOABS 0.4 08/25/2019 1018   EOSABS 0.1 08/25/2019 1018   BASOSABS 0.0 08/25/2019 1018    BMET    Component Value Date/Time   NA 142 08/25/2019 1018   NA 142 01/20/2014 1127   NA 141 02/13/2012 2319   K 4.1 08/25/2019 1018   K 3.6 02/13/2012 2319   CL 108 08/25/2019 1018   CL 106  02/13/2012 2319   CO2 23 08/25/2019 1018   CO2 27 02/13/2012 2319   GLUCOSE 97 08/25/2019 1018   GLUCOSE 92 02/13/2012 2319   BUN 14 08/25/2019 1018   BUN 16 01/20/2014 1127   BUN 16 02/13/2012 2319   CREATININE 1.02 (H) 08/25/2019 1018   CREATININE 1.05 02/13/2012 2319   CALCIUM 9.8 08/25/2019 1018   CALCIUM 8.7 02/13/2012 2319   GFRNONAA 59 (L) 08/25/2019 1018   GFRNONAA >60 02/13/2012 2319   GFRAA >60 08/25/2019 1018   GFRAA >60 02/13/2012 2319    BNP    Component Value Date/Time   BNP 1,292 (H) 02/13/2012 2319    ProBNP No results found for: PROBNP  Specialty Problems      Pulmonary Problems   SHORTNESS OF BREATH    Qualifier: Diagnosis of  By: Tamala Julian RN, Megan        COPD with  chronic bronchitis and emphysema (HCC)   Allergic rhinitis   Mild persistent asthma   Mild intermittent asthma   OSA (obstructive sleep apnea)      Allergies  Allergen Reactions  . Latex Rash       . Shellfish Allergy Anaphylaxis    Hard shellfish/swelling in throat  . Sulfonamide Derivatives Anaphylaxis    Swelling in throat.  . Watermelon [Citrullus Vulgaris] Other (See Comments)    Throat itchy  . Soy Allergy     Diarrhea/upset stomach    . Other Swelling    Immunization History  Administered Date(s) Administered  . Influenza Split 05/22/2015, 04/21/2016  . Influenza,inj,Quad PF,6+ Mos 04/05/2018, 03/29/2019  . Tdap 05/05/2019    Past Medical History:  Diagnosis Date  . Allergy   . Anemia   . Arthritis   . Asthma   . Bacterial vaginitis   . Blood in stool   . Brachial neuritis or radiculitis NOS   . 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 being followed by neurosurgery/eye doctor and pcp. monitoring size.causes sinus problems  . Bronchitis   . Cardiac arrhythmia   . Cervical neck pain with evidence of disc disease    C5/6 disease, MRI done late 2012 - no records available  . Chronic combined systolic and diastolic CHF (congestive heart failure) ([place])    a. 08/2010 Echo: mildly reduced EF 40-45%, mild diffuse hypokinesis; b. 06/2016 Echo: EF 50%, no rwma, mild to mod TR; c. 03/2017 Echo: EF 40-45%, Gr1 DD (prior echo reviewed and EF felt to be lower than reported).  . Chronic pain   . Cocaine abuse, in remission ([place])    clean x 24 years  . Colon polyps   . COPD (chronic obstructive pulmonary disease) ([place])    a. 12/2018 PFT: No obvious obst/restrictive dzs.  . Coronary artery disease    a. PCI of LCX 2003; b. PCI of the LAD 2012 with a (2.5 x 8 mm BMS);  c.s/p CABG 4/12: L-LAD, VG-Dx, VG-OM, VG-RCA (Dr. Prescott Gum);  d. 01/2012 MV: inf infarct, attenuation, no ischemia; e. 02/2017 MV: signifi attenuation artifact. Fixed basal antlat/inflat scar vs  artifact. Reversible apical lat and mid antlat defect - ? atten vs ischemia. F/u echo w/o wma->Med rx.  . Depression   . Diabetes mellitus without complication ([place])   . Family history of colon cancer   . Generalized headaches    frequent  . GERD (gastroesophageal reflux disease)   . Headache   . Heart murmur   . Hematochezia   . Hepatitis  history of hepatitis b  . History of cervical cancer    s/p cryotherapy  . History of drug abuse ([place])    cocaine, marijuana, clean since 1989  . History of hepatitis B    from eating undercooked liver  . History of MI (myocardial infarction)   . Hyperlipidemia   . Hypertension   . Ischemic cardiomyopathy    a. 03/2017 Echo: EF 40-45%, Gr1 DD.  [name] Myocardial infarction [facility]) 2003, 2012  . PAD (peripheral artery disease) ([place])    a. s/p Right SFA atherectomy and PTA 01/15/11; b. 07/2018 ABI: R 1.02, L 1.09.  [name] Pituitary mass ([place])    a. 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 Mass abuts R cavernous sinus w/o definite invasion.  . Polyp of colon   . Seasonal allergies   . Sleep apnea    uses cpap  . Smoking history    quit 07/2010  . Thyroid disease   . Urinary incontinence     Tobacco History: Social History   Tobacco Use  Smoking Status Former Smoker  . Packs/day: 1.00  . Years: 38.00  . Pack years: 38.00  . Types: Cigarettes  . Quit date: 08/12/2010  . Years since quitting: 9.3  Smokeless Tobacco Never Used   Counseling given: Not Answered   Continue to not smoke  Outpatient Encounter Medications as of 12/15/2019  Medication Sig  . albuterol (VENTOLIN HFA) 108 (90 Base) MCG/ACT inhaler Inhale 2 puffs into the lungs every 6 (six) hours as needed for wheezing or shortness of breath.  [name] aspirin EC 81 MG tablet Take 1 tablet (81 mg total) by mouth daily.  . Azelastine HCl 0.15 % SOLN Place 2 sprays into both nostrils daily as needed (allergies).   . cyclobenzaprine (FLEXERIL) 5 MG tablet Take 1-2  tablets (5-10 mg total) by mouth at bedtime as needed for muscle spasms.  [name] dicyclomine (BENTYL) 10 MG capsule Take 1 capsule (10 mg total) by mouth 3 (three) times daily before meals. As needed for abdominal pain  . esomeprazole (NEXIUM) 40 MG capsule Take 1 capsule (40 mg total) by mouth daily. Reported on 09/04/2015  . Evolocumab (REPATHA SURECLICK) [id number] MG/ML SOAJ Inject 1 pen into the skin every 14 (fourteen) days.  [name] ezetimibe (ZETIA) 10 MG tablet Take 1 tablet (10 mg total) by mouth daily.  . fluticasone (FLONASE) 50 MCG/ACT nasal spray Place 2 sprays into both nostrils daily as needed for allergies.   . fluticasone (FLOVENT HFA) 110 MCG/ACT inhaler Inhale 1 puff into the lungs 2 (two) times a day. Rinse mouth  . furosemide (LASIX) 20 MG tablet TAKE 1 TABLET BY MOUTH ONCE DAILY AND A SECOND TABLET IF NEEDED FOR FLUID BUILD UP  . glucose blood (ONE TOUCH ULTRA TEST) test strip Use as instructed check cbg qd E11.9  . hydrOXYzine (ATARAX/VISTARIL) 25 MG tablet Take 1-2 tablets (25-50 mg total) by mouth daily as needed.  [name] ipratropium-albuterol (DUONEB) 0.5-2.5 (3) MG/3ML SOLN Take 3 mLs by nebulization 2 (two) times daily as needed (wheezing/shortness of breath).  . isosorbide mononitrate (IMDUR) 30 MG 24 hr tablet Take 1 tablet by mouth twice daily  . Lancets MISC 1 Device by Does not apply route daily. Lancets E11.9  . levocetirizine (XYZAL) 5 MG tablet Take 1 tablet (5 mg total) by mouth at bedtime as needed for allergies.  [name] meclizine (ANTIVERT) 25 MG tablet Take 25 mg by mouth 3 (three) times daily.   [name]  metFORMIN (GLUCOPHAGE) 500 MG tablet Take 1 tablet (500 mg total) by mouth daily with breakfast.  . methocarbamol (ROBAXIN) 500 MG tablet Take 1 tablet (500 mg total) by mouth every 6 (six) hours as needed for muscle spasms.  . metoprolol succinate (TOPROL-XL) 50 MG 24 hr tablet Take 1 tablet (50 mg total) by mouth daily. Take with or immediately following a meal.  . mirabegron ER (MYRBETRIQ)  25 MG TB24 tablet Take 1 tablet (25 mg total) by mouth daily. (Patient taking differently: Take 25 mg by mouth daily as needed (urinary frequency). )  . nitroGLYCERIN (NITROSTAT) 0.4 MG SL tablet Place 1 tablet (0.4 mg total) under the tongue every 5 (five) minutes as needed.  . ondansetron (ZOFRAN) 4 MG tablet Take 1 tablet (4 mg total) by mouth every 8 (eight) hours as needed for nausea or vomiting.  [name] PARoxetine (PAXIL) 20 MG tablet Take 1 tablet (20 mg total) by mouth daily.  . potassium chloride (KLOR-CON) 10 MEQ tablet Take 1 tablet (10 mEq total) by mouth daily.  . predniSONE (DELTASONE) 20 MG tablet Take 2 tablets (40 mg total) by mouth daily with breakfast. With food  . rosuvastatin (CRESTOR) 20 MG tablet Take 1 tablet (20 mg total) by mouth at bedtime.  [name] umeclidinium-vilanterol (ANORO ELLIPTA) 62.5-25 MCG/INH AEPB Inhale 1 puff into the lungs daily.  Alveda Reasons 2.5 MG TABS tablet Take 1 tablet by mouth twice daily  . linaclotide (LINZESS) 72 MCG capsule Take 1 capsule (72 mcg total) by mouth daily before breakfast.  . losartan (COZAAR) 25 MG tablet Take 1 tablet (25 mg total) by mouth daily. (Patient taking differently: Take 25 mg by mouth every evening. )  . [DISCONTINUED] Lactobacillus (ACIDOPHILUS PROBIOTIC) 10 MG CAPS Take 1 capsule by mouth daily. (Patient not taking: Reported on 12/02/2019)   No facility-administered encounter medications on file as of 12/15/2019.     Review of Systems  Review of Systems  Constitutional: Negative for activity change, fatigue and fever.  HENT: Negative for sinus pressure, sinus pain and sore throat.   Respiratory: Positive for shortness of breath. Negative for cough and wheezing.   Cardiovascular: Negative for chest pain and palpitations.  Gastrointestinal: Negative for diarrhea, nausea and vomiting.  Musculoskeletal: Negative for arthralgias.  Neurological: Negative for dizziness.  Psychiatric/Behavioral: Negative for sleep disturbance. The  patient is not nervous/anxious.      Physical Exam  BP 126/74 (BP Location: Left Arm, Cuff Size: Normal)   Pulse 80   Temp (!) 97.5 F (36.4 C) (Temporal)   Ht 5\' 5"  (1.651 m)   Wt 208 lb (94.3 kg)   SpO2 96%   BMI 34.61 kg/m   Wt Readings from Last 5 Encounters:  12/15/19 208 lb (94.3 kg)  12/02/19 206 lb 12.8 oz (93.8 kg)  11/15/19 206 lb (93.4 kg)  10/13/19 198 lb 8 oz (90 kg)  10/10/19 198 lb 8 oz (90 kg)    BMI Readings from Last 5 Encounters:  12/15/19 34.61 kg/m  12/02/19 34.41 kg/m  11/15/19 34.28 kg/m  10/13/19 33.03 kg/m  10/10/19 33.03 kg/m     Physical Exam Vitals and nursing note reviewed.  Constitutional:      General: She is not in acute distress.    Appearance: Normal appearance.  HENT:     Head: Normocephalic and atraumatic.     Right Ear: External ear normal.     Left Ear: External ear normal.     Nose: Nose normal. No congestion.  Mouth/Throat:     Mouth: Mucous membranes are moist.     Pharynx: Oropharynx is clear.  Eyes:     Pupils: Pupils are equal, round, and reactive to light.  Cardiovascular:     Rate and Rhythm: Normal rate and regular rhythm.     Pulses: Normal pulses.     Heart sounds: Normal heart sounds. No murmur.  Pulmonary:     Effort: Pulmonary effort is normal. No respiratory distress.     Breath sounds: Normal breath sounds. No decreased air movement. No decreased breath sounds, wheezing or rales.  Musculoskeletal:     Cervical back: Normal range of motion.  Skin:    General: Skin is warm and dry.     Capillary Refill: Capillary refill takes less than 2 seconds.  Neurological:     General: No focal deficit present.     Mental Status: She is alert and oriented to person, place, and time. Mental status is at baseline.     Gait: Gait normal.  Psychiatric:        Mood and Affect: Mood normal.        Behavior: Behavior normal.        Thought Content: Thought content normal.        Judgment: Judgment normal.        Assessment & Plan:   COPD with chronic bronchitis and emphysema (HCC) 30-pack-year smoking history Maintained on Anoro Ellipta Recently treated with steroids  Plan: Continue Anoro Ellipta We will get established with new pulmonologist in our office Walk today in office, stable without any oxygen desaturations Cautioned patient on follow-up with our office if she is having worsened breathing so that way we can track her exacerbations May need to consider triple therapy inhaler if patient continues to have frequent flares  Systolic CHF, chronic Patient endorsing fluid retention today  Plan: Keep follow-up with primary care and cardiology Continue medications as prescribed  OSA (obstructive sleep apnea) Moderate obstructive sleep apnea Not controlled on CPAP therapy due to inability to obtain latex free mask  Plan: Resume CPAP therapy Order placed to DME company for latex free mask Emphasized importance the patient she needs to have follow-up with our office and with her DME company to ensure that she is maintained on CPAP therapy    Return in about 2 months (around 02/14/2020), or if symptoms worsen or fail to improve, for NEW PULMONOLOGIST IN 54min SLOT, [employer].   Lauraine Rinne, NP 12/15/2019   This appointment required 34 minutes of patient care (this includes precharting, chart review, review of results, face-to-face care, etc.).

## 2019-12-15 ENCOUNTER — Encounter: Payer: Self-pay | Admitting: Pulmonary Disease

## 2019-12-15 ENCOUNTER — Other Ambulatory Visit: Payer: Self-pay

## 2019-12-15 ENCOUNTER — Ambulatory Visit (INDEPENDENT_AMBULATORY_CARE_PROVIDER_SITE_OTHER): Payer: Medicare HMO | Admitting: Pulmonary Disease

## 2019-12-15 VITALS — BP 126/74 | HR 80 | Temp 97.5°F | Ht 65.0 in | Wt 208.0 lb

## 2019-12-15 DIAGNOSIS — I5022 Chronic systolic (congestive) heart failure: Secondary | ICD-10-CM

## 2019-12-15 DIAGNOSIS — J449 Chronic obstructive pulmonary disease, unspecified: Secondary | ICD-10-CM | POA: Diagnosis not present

## 2019-12-15 DIAGNOSIS — G4733 Obstructive sleep apnea (adult) (pediatric): Secondary | ICD-10-CM

## 2019-12-15 NOTE — Assessment & Plan Note (Signed)
Moderate obstructive sleep apnea Not controlled on CPAP therapy due to inability to obtain latex free mask  Plan: Resume CPAP therapy Order placed to DME company for latex free mask Emphasized importance the patient she needs to have follow-up with our office and with her DME company to ensure that she is maintained on CPAP therapy

## 2019-12-15 NOTE — Assessment & Plan Note (Signed)
30-pack-year smoking history Maintained on Anoro Ellipta Recently treated with steroids  Plan: Continue Anoro Ellipta We will get established with new pulmonologist in our office Walk today in office, stable without any oxygen desaturations Cautioned patient on follow-up with our office if she is having worsened breathing so that way we can track her exacerbations May need to consider triple therapy inhaler if patient continues to have frequent flares

## 2019-12-15 NOTE — Progress Notes (Signed)
Reviewed and agree with assessment/plan.   Chesley Mires, MD [facility] Pulmonary/Critical Care 12/15/2019, 12:53 PM Pager:  [phone]

## 2019-12-15 NOTE — Assessment & Plan Note (Signed)
Patient endorsing fluid retention today  Plan: Keep follow-up with primary care and cardiology Continue medications as prescribed

## 2019-12-15 NOTE — Patient Instructions (Addendum)
You were seen today by Lauraine Rinne, NP  for:   1. Chronic Bronchitis   Anoro Ellipta  >>> Take 1 puff daily in the morning right when you wake up >>>Rinse your mouth out after use >>>This is a daily maintenance inhaler, NOT a rescue inhaler >>>Contact our office if you are having difficulties affording or obtaining this medication >>>It is important for you to be able to take this daily and not miss any doses   Only use your albuterol as a rescue medication to be used if you can't catch your breath by resting or doing a relaxed purse lip breathing pattern.  - The less you use it, the better it will work when you need it. - Ok to use up to 2 puffs  every 4 hours if you must but call for immediate appointment if use goes up over your usual need - Don't leave home without it !!  (think of it like the spare tire for your car)   Note your daily symptoms > remember "red flags" for COPD:   >>>Increase in cough >>>increase in sputum production >>>increase in shortness of breath or activity  intolerance.   If you notice these symptoms, please call the office to be seen.     2. OSA (obstructive sleep apnea)   Needs new order to DME: [employer] for new mask that is latex free  We recommend that you continue using your CPAP daily >>>Keep up the hard work using your device >>> Goal should be wearing this for the entire night that you are sleeping, at least 4 to 6 hours  Remember:  . Do not drive or operate heavy machinery if tired or drowsy.  . Please notify the supply company and office if you are unable to use your device regularly due to missing supplies or machine being broken.  . Work on maintaining a healthy weight and following your recommended nutrition plan  . Maintain proper daily exercise and movement  . Maintaining proper use of your device can also help improve management of other chronic illnesses such as: Blood pressure, blood sugars, and weight management.   BiPAP/ CPAP  Cleaning:  >>>Clean weekly, with Dawn soap, and bottle brush.  Set up to air dry. >>> Wipe mask out daily with wet wipe or towelette    Follow Up:    Return in about 2 months (around 02/14/2020), or if symptoms worsen or fail to improve, for NEW PULMONOLOGIST IN 78min SLOT, [employer].   Please do your part to reduce the spread of COVID-19:      Reduce your risk of any infection  and COVID19 by using the similar precautions used for avoiding the common cold or flu:  [name] Wash your hands often with soap and warm water for at least 20 seconds.  If soap and water are not readily available, use an alcohol-based hand sanitizer with at least 60% alcohol.  . If coughing or sneezing, cover your mouth and nose by coughing or sneezing into the elbow areas of your shirt or coat, into a tissue or into your sleeve (not your hands). [name] A MASK when in public  . Avoid shaking hands with others and consider head nods or verbal greetings only. . Avoid touching your eyes, nose, or mouth with unwashed hands.  . Avoid close contact with people who are sick. . Avoid places or events with large numbers of people in one location, like concerts or sporting events. [name]  If you have some symptoms but not all symptoms, continue to monitor at home and seek medical attention if your symptoms worsen. . If you are having a medical emergency, call 911.   [place] / e-Visit: [email or web address]         [facility]: [facility]: [id number]                   [facility]: [id number]     It is flu season:   >>> Best ways to protect herself from the flu: Receive the yearly flu vaccine, practice good hand hygiene washing with soap and also using hand sanitizer when available, eat a nutritious meals, get adequate rest, hydrate  appropriately   Please contact the office if your symptoms worsen or you have concerns that you are not improving.   Thank you for choosing [facility] for your healthcare, and for allowing [place] to partner with you on your healthcare journey. I am thankful to be able to provide care to you today.   Wyn Quaker FNP-C

## 2019-12-16 ENCOUNTER — Ambulatory Visit
Admission: RE | Admit: 2019-12-16 | Discharge: 2019-12-16 | Disposition: A | Discharge status: Home / Self Care | Payer: Medicare HMO | Source: Ambulatory Visit | Attending: Student | Admitting: Student

## 2019-12-16 DIAGNOSIS — R29898 Other symptoms and signs involving the musculoskeletal system: Secondary | ICD-10-CM | POA: Insufficient documentation

## 2019-12-16 DIAGNOSIS — M542 Cervicalgia: Secondary | ICD-10-CM | POA: Diagnosis not present

## 2019-12-27 DIAGNOSIS — G4733 Obstructive sleep apnea (adult) (pediatric): Secondary | ICD-10-CM | POA: Diagnosis not present

## 2019-12-27 DIAGNOSIS — I259 Chronic ischemic heart disease, unspecified: Secondary | ICD-10-CM | POA: Diagnosis not present

## 2019-12-27 DIAGNOSIS — G4710 Hypersomnia, unspecified: Secondary | ICD-10-CM | POA: Diagnosis not present

## 2019-12-28 DIAGNOSIS — G4733 Obstructive sleep apnea (adult) (pediatric): Secondary | ICD-10-CM | POA: Diagnosis not present

## 2020-01-05 DIAGNOSIS — Z96651 Presence of right artificial knee joint: Secondary | ICD-10-CM | POA: Diagnosis not present

## 2020-01-05 DIAGNOSIS — T8484XA Pain due to internal orthopedic prosthetic devices, implants and grafts, initial encounter: Secondary | ICD-10-CM | POA: Diagnosis not present

## 2020-01-05 DIAGNOSIS — M869 Osteomyelitis, unspecified: Secondary | ICD-10-CM | POA: Diagnosis not present

## 2020-01-13 ENCOUNTER — Other Ambulatory Visit: Payer: Self-pay | Admitting: Cardiovascular Disease

## 2020-01-23 DIAGNOSIS — R208 Other disturbances of skin sensation: Secondary | ICD-10-CM | POA: Diagnosis not present

## 2020-01-23 DIAGNOSIS — R200 Anesthesia of skin: Secondary | ICD-10-CM | POA: Diagnosis not present

## 2020-01-23 DIAGNOSIS — R0781 Pleurodynia: Secondary | ICD-10-CM | POA: Diagnosis not present

## 2020-01-23 DIAGNOSIS — R202 Paresthesia of skin: Secondary | ICD-10-CM | POA: Diagnosis not present

## 2020-01-23 DIAGNOSIS — E559 Vitamin D deficiency, unspecified: Secondary | ICD-10-CM | POA: Diagnosis not present

## 2020-01-23 DIAGNOSIS — E538 Deficiency of other specified B group vitamins: Secondary | ICD-10-CM | POA: Diagnosis not present

## 2020-01-23 DIAGNOSIS — Z951 Presence of aortocoronary bypass graft: Secondary | ICD-10-CM | POA: Diagnosis not present

## 2020-01-23 DIAGNOSIS — E531 Pyridoxine deficiency: Secondary | ICD-10-CM | POA: Diagnosis not present

## 2020-01-23 DIAGNOSIS — D352 Benign neoplasm of pituitary gland: Secondary | ICD-10-CM | POA: Diagnosis not present

## 2020-01-23 DIAGNOSIS — G4733 Obstructive sleep apnea (adult) (pediatric): Secondary | ICD-10-CM | POA: Diagnosis not present

## 2020-01-23 DIAGNOSIS — E519 Thiamine deficiency, unspecified: Secondary | ICD-10-CM | POA: Diagnosis not present

## 2020-01-24 ENCOUNTER — Other Ambulatory Visit: Payer: Self-pay | Admitting: Neurology

## 2020-01-24 DIAGNOSIS — D352 Benign neoplasm of pituitary gland: Secondary | ICD-10-CM

## 2020-01-25 DIAGNOSIS — R208 Other disturbances of skin sensation: Secondary | ICD-10-CM | POA: Diagnosis not present

## 2020-01-25 DIAGNOSIS — R0781 Pleurodynia: Secondary | ICD-10-CM | POA: Diagnosis not present

## 2020-01-25 DIAGNOSIS — R202 Paresthesia of skin: Secondary | ICD-10-CM | POA: Diagnosis not present

## 2020-01-25 DIAGNOSIS — J9811 Atelectasis: Secondary | ICD-10-CM | POA: Diagnosis not present

## 2020-01-25 DIAGNOSIS — R200 Anesthesia of skin: Secondary | ICD-10-CM | POA: Diagnosis not present

## 2020-01-31 ENCOUNTER — Telehealth: Payer: Self-pay | Admitting: Internal Medicine

## 2020-01-31 NOTE — Telephone Encounter (Signed)
Pt thinks she has thrush and would like something called in

## 2020-02-01 ENCOUNTER — Other Ambulatory Visit: Payer: Self-pay | Admitting: Internal Medicine

## 2020-02-01 DIAGNOSIS — B370 Candidal stomatitis: Secondary | ICD-10-CM

## 2020-02-01 MED ORDER — NYSTATIN 100000 UNIT/ML MT SUSP: 5.0000 mL | Freq: Four times a day (QID) | OROMUCOSAL | 0 refills | Status: DC

## 2020-02-01 NOTE — Telephone Encounter (Signed)
Patient called on the status of this request.

## 2020-02-01 NOTE — Telephone Encounter (Signed)
Patient informed that nystatin (MYCOSTATIN) 100000 UNIT/ML suspension has been sent in to her pharmacy. Patient verbalized understanding

## 2020-02-11 ENCOUNTER — Ambulatory Visit
Admission: RE | Admit: 2020-02-11 | Discharge: 2020-02-11 | Disposition: A | Discharge status: Home / Self Care | Payer: Medicare HMO | Source: Ambulatory Visit | Attending: Neurology | Admitting: Neurology

## 2020-02-11 ENCOUNTER — Other Ambulatory Visit: Payer: Self-pay

## 2020-02-11 DIAGNOSIS — D352 Benign neoplasm of pituitary gland: Secondary | ICD-10-CM | POA: Diagnosis not present

## 2020-02-11 DIAGNOSIS — R9082 White matter disease, unspecified: Secondary | ICD-10-CM | POA: Diagnosis not present

## 2020-02-11 DIAGNOSIS — E237 Disorder of pituitary gland, unspecified: Secondary | ICD-10-CM | POA: Diagnosis not present

## 2020-02-11 MED ORDER — GADOBUTROL 1 MMOL/ML IV SOLN
9.0000 mL | Freq: Once | INTRAVENOUS | Status: AC | PRN
  Administered 2020-02-11: 9 mL via INTRAVENOUS

## 2020-02-14 ENCOUNTER — Other Ambulatory Visit: Payer: Self-pay | Admitting: Cardiovascular Disease

## 2020-02-15 DIAGNOSIS — I259 Chronic ischemic heart disease, unspecified: Secondary | ICD-10-CM | POA: Diagnosis not present

## 2020-02-15 DIAGNOSIS — G4710 Hypersomnia, unspecified: Secondary | ICD-10-CM | POA: Diagnosis not present

## 2020-02-15 DIAGNOSIS — G4733 Obstructive sleep apnea (adult) (pediatric): Secondary | ICD-10-CM | POA: Diagnosis not present

## 2020-02-16 DIAGNOSIS — I259 Chronic ischemic heart disease, unspecified: Secondary | ICD-10-CM | POA: Diagnosis not present

## 2020-02-16 DIAGNOSIS — G4710 Hypersomnia, unspecified: Secondary | ICD-10-CM | POA: Diagnosis not present

## 2020-02-16 DIAGNOSIS — G4733 Obstructive sleep apnea (adult) (pediatric): Secondary | ICD-10-CM | POA: Diagnosis not present

## 2020-02-17 DIAGNOSIS — R200 Anesthesia of skin: Secondary | ICD-10-CM | POA: Diagnosis not present

## 2020-02-17 DIAGNOSIS — R202 Paresthesia of skin: Secondary | ICD-10-CM | POA: Diagnosis not present

## 2020-02-21 ENCOUNTER — Encounter: Payer: Self-pay | Admitting: Internal Medicine

## 2020-02-21 ENCOUNTER — Other Ambulatory Visit: Payer: Self-pay

## 2020-02-21 ENCOUNTER — Ambulatory Visit (INDEPENDENT_AMBULATORY_CARE_PROVIDER_SITE_OTHER): Payer: Medicare HMO | Admitting: Internal Medicine

## 2020-02-21 ENCOUNTER — Institutional Professional Consult (permissible substitution): Payer: Medicare HMO | Admitting: Pulmonary Disease

## 2020-02-21 VITALS — BP 112/82 | HR 63 | Temp 98.1°F | Ht 65.0 in | Wt 206.2 lb

## 2020-02-21 DIAGNOSIS — M542 Cervicalgia: Secondary | ICD-10-CM | POA: Diagnosis not present

## 2020-02-21 DIAGNOSIS — N6001 Solitary cyst of right breast: Secondary | ICD-10-CM | POA: Diagnosis not present

## 2020-02-21 DIAGNOSIS — M545 Low back pain, unspecified: Secondary | ICD-10-CM

## 2020-02-21 DIAGNOSIS — K921 Melena: Secondary | ICD-10-CM | POA: Diagnosis not present

## 2020-02-21 DIAGNOSIS — B370 Candidal stomatitis: Secondary | ICD-10-CM

## 2020-02-21 DIAGNOSIS — R1030 Lower abdominal pain, unspecified: Secondary | ICD-10-CM | POA: Diagnosis not present

## 2020-02-21 DIAGNOSIS — N3000 Acute cystitis without hematuria: Secondary | ICD-10-CM

## 2020-02-21 DIAGNOSIS — M869 Osteomyelitis, unspecified: Secondary | ICD-10-CM

## 2020-02-21 DIAGNOSIS — D352 Benign neoplasm of pituitary gland: Secondary | ICD-10-CM | POA: Diagnosis not present

## 2020-02-21 DIAGNOSIS — K648 Other hemorrhoids: Secondary | ICD-10-CM | POA: Diagnosis not present

## 2020-02-21 DIAGNOSIS — G8929 Other chronic pain: Secondary | ICD-10-CM

## 2020-02-21 NOTE — Progress Notes (Signed)
Patient state she is still having lower abdominal pain in the stomach and lower back. Also states she can still feel a mass in the left breast.   Patient flagged: Current status:  PATIENT IS OVERDUE FOR BMI FOLLOW UP PLAN BMI is estimated to be 34.3 based on the last recorded weight and height

## 2020-02-21 NOTE — Patient Instructions (Addendum)
Biotene mouthwash over the counter for dry mouth     Hemorrhoids Hemorrhoids are swollen veins in and around the rectum or anus. There are two types of hemorrhoids:  Internal hemorrhoids. These occur in the veins that are just inside the rectum. They may poke through to the outside and become irritated and painful.  External hemorrhoids. These occur in the veins that are outside the anus and can be felt as a painful swelling or hard lump near the anus. Most hemorrhoids do not cause serious problems, and they can be managed with home treatments such as diet and lifestyle changes. If home treatments do not help the symptoms, procedures can be done to shrink or remove the hemorrhoids. What are the causes? This condition is caused by increased pressure in the anal area. This pressure may result from various things, including:  Constipation.  Straining to have a bowel movement.  Diarrhea.  Pregnancy.  Obesity.  Sitting for long periods of time.  Heavy lifting or other activity that causes you to strain.  Anal sex.  Riding a bike for a long period of time. What are the signs or symptoms? Symptoms of this condition include:  Pain.  Anal itching or irritation.  Rectal bleeding.  Leakage of stool (feces).  Anal swelling.  One or more lumps around the anus. How is this diagnosed? This condition can often be diagnosed through a visual exam. Other exams or tests may also be done, such as:  An exam that involves feeling the rectal area with a gloved hand (digital rectal exam).  An exam of the anal canal that is done using a small tube (anoscope).  A blood test, if you have lost a significant amount of blood.  A test to look inside the colon using a flexible tube with a camera on the end (sigmoidoscopy or colonoscopy). How is this treated? This condition can usually be treated at home. However, various procedures may be done if dietary changes, lifestyle changes, and other  home treatments do not help your symptoms. These procedures can help make the hemorrhoids smaller or remove them completely. Some of these procedures involve surgery, and others do not. Common procedures include:  Rubber band ligation. Rubber bands are placed at the base of the hemorrhoids to cut off their blood supply.  Sclerotherapy. Medicine is injected into the hemorrhoids to shrink them.  Infrared coagulation. A type of light energy is used to get rid of the hemorrhoids.  Hemorrhoidectomy surgery. The hemorrhoids are surgically removed, and the veins that supply them are tied off.  Stapled hemorrhoidopexy surgery. The surgeon staples the base of the hemorrhoid to the rectal wall. Follow these instructions at home: Eating and drinking   Eat foods that have a lot of fiber in them, such as whole grains, beans, nuts, fruits, and vegetables.  Ask your health care provider about taking products that have added fiber (fiber supplements).  Reduce the amount of fat in your diet. You can do this by eating low-fat dairy products, eating less red meat, and avoiding processed foods.  Drink enough fluid to keep your urine pale yellow. Managing pain and swelling   Take warm sitz baths for 20 minutes, 3-4 times a day to ease pain and discomfort. You may do this in a bathtub or using a portable sitz bath that fits over the toilet.  If directed, apply ice to the affected area. Using ice packs between sitz baths may be helpful. ? Put ice in a plastic bag. ?  Place a towel between your skin and the bag. ? Leave the ice on for 20 minutes, 2-3 times a day. General instructions  Take over-the-counter and prescription medicines only as told by your health care provider.  Use medicated creams or suppositories as told.  Get regular exercise. Ask your health care provider how much and what kind of exercise is best for you. In general, you should do moderate exercise for at least 30 minutes on most days  of the week (150 minutes each week). This can include activities such as walking, biking, or yoga.  Go to the bathroom when you have the urge to have a bowel movement. Do not wait.  Avoid straining to have bowel movements.  Keep the anal area dry and clean. Use wet toilet paper or moist towelettes after a bowel movement.  Do not sit on the toilet for long periods of time. This increases blood pooling and pain.  Keep all follow-up visits as told by your health care provider. This is important. Contact a health care provider if you have:  Increasing pain and swelling that are not controlled by treatment or medicine.  Difficulty having a bowel movement, or you are unable to have a bowel movement.  Pain or inflammation outside the area of the hemorrhoids. Get help right away if you have:  Uncontrolled bleeding from your rectum. Summary  Hemorrhoids are swollen veins in and around the rectum or anus.  Most hemorrhoids can be managed with home treatments such as diet and lifestyle changes.  Taking warm sitz baths can help ease pain and discomfort.  In severe cases, procedures or surgery can be done to shrink or remove the hemorrhoids. This information is not intended to replace advice given to you by your health care provider. Make sure you discuss any questions you have with your health care provider. Document Revised: 12/10/2018 Document Reviewed: 12/03/2017 Else[employer] Patient Education  2020 [place], Adult  Oral thrush, also called oral candidiasis, is a fungal infection that develops in the mouth and throat and on the tongue. It causes white patches to form on the mouth and tongue. Ritta Slot is most common in older adults, but it can occur at any age. Many cases of thrush are mild, but this infection can also be serious. Ritta Slot can be a repeated (recurrent) problem for certain people who have a weak body defense system (immune system). The weakness can be caused by  chronic illnesses, or by taking medicines that limit the body's ability to fight infection. If a person has difficulty fighting infection, the fungus that causes thrush can spread through the body. This can cause life-threatening blood or organ infections. What are the causes? This condition is caused by a fungus (yeast) called Candida albicans.  This fungus is normally present in small amounts in the mouth and on other mucous membranes. It usually causes no harm.  If conditions are present that allow the fungus to grow without control, it invades surrounding tissues and becomes an infection.  Other Candida species can also lead to thrush (rare). What increases the risk? This condition is more likely to develop in:  People with a weakened immune system.  Older adults.  People with HIV (human immunodeficiency virus).  People with diabetes.  People with dry mouth (xerostomia).  Pregnant women.  People with poor dental care, especially people who have false teeth.  People who use antibiotic medicines. What are the signs or symptoms? Symptoms of this condition can vary  from mild and moderate to severe and persistent. Symptoms may include:  A burning feeling in the mouth and throat. This can occur at the start of a thrush infection.  White patches that stick to the mouth and tongue. The tissue around the patches may be red, raw, and painful. If rubbed (during tooth brushing, for example), the patches and the tissue of the mouth may bleed easily.  A bad taste in the mouth or difficulty tasting foods.  A cottony feeling in the mouth.  Pain during eating and swallowing.  Poor appetite.  Cracking at the corners of the mouth. How is this diagnosed? This condition is diagnosed based on:  Physical exam. Your health care provider will look in your mouth.  Health history. Your health care provider will ask you questions about your health. How is this treated? This condition is  treated with medicines called antifungals, which prevent the growth of fungi. These medicines are either applied directly to the affected area (topical) or swallowed (oral). The treatment will depend on the severity of the condition. Mild thrush Mild cases of thrush may clear up with the use of an antifungal mouth rinse or lozenges. Treatment usually lasts about 14 days. Moderate to severe thrush  More severe thrush infections that have spread to the esophagus are treated with an oral antifungal medicine. A topical antifungal medicine may also be used.  For some severe infections, treatment may need to continue for more than 14 days.  Oral antifungal medicines are rarely used during pregnancy because they may be harmful to the unborn child. If you are pregnant, talk with your health care provider about options for treatment. Persistent or recurrent thrush For cases of thrush that do not go away or keep coming back:  Treatment may be needed twice as long as the symptoms last.  Treatment will include both oral and topical antifungal medicines.  People with a weakened immune system can take an antifungal medicine on a continuous basis to prevent thrush infections. It is important to treat conditions that make a person more likely to get thrush, such as diabetes or HIV. Follow these instructions at home: Medicines  Take over-the-counter and prescription medicines only as told by your health care provider.  Talk with your health care provider about an over-the-counter medicine called gentian violet, which kills bacteria and fungi. Relieving soreness and discomfort To help reduce the discomfort of thrush:  Drink cold liquids such as water or iced tea.  Try flavored ice treats or frozen juices.  Eat foods that are easy to swallow, such as gelatin, ice cream, or custard.  Try drinking from a straw if the patches in your mouth are painful.  General instructions  Eat plain, unflavored  yogurt as directed by your health care provider. Check the label to make sure the yogurt contains live cultures. This yogurt can help healthy bacteria to grow in the mouth and can stop the growth of the fungus that causes thrush.  If you wear dentures, remove the dentures before going to bed, brush them vigorously, and soak them in a cleaning solution as directed by your health care provider.  Rinse your mouth with a warm salt-water mixture several times a day. To make a salt-water mixture, completely dissolve 1/2-1 tsp of salt in 1 cup of warm water. Contact a health care provider if:  Your symptoms are getting worse or are not improving within 7 days of starting treatment.  You have symptoms of a spreading infection, such  as white patches on the skin outside of the mouth. This information is not intended to replace advice given to you by your health care provider. Make sure you discuss any questions you have with your health care provider. Document Revised: 10/16/2017 Document Reviewed: 04/07/2016 [employer] Patient Education  [place].

## 2020-02-21 NOTE — Progress Notes (Signed)
Chief Complaint  Patient presents with  . Follow-up  . Thrush  . Abdominal Pain   F/u  1. 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 Merla Riches  Reached out to Dr. Deetta Perla NS and he rec surgery if bothersome to pt will refer Dr. Tennis Must [facility] if pt interested  His comment below:  Surgery is definitely an option, I do think she needs updated ophthalmologic evaluation.  If she is not having any visual symptoms, that amount of growth is typically negligible over that amount of time as the MRI slices are usually 1 to 2 mm.  I am glad to see her again to go over the MRI if she would like, may save her trip to [facility]   2. Pt c/o recurrent thrush though tongue is white today no obvious thrush used nystatin mouthwash  Her mouth is dry discussed biotene mouthwash instead of listerine  C/o abdominal pain lower and blood in stool with h/o tubular adenomas 01/2017 Dr. Allen Norris agreeable to f/u prior colonoscopy h/o IH so ? If bleeding due to bleeding hemorriods CT ab/pelvis 08/25/19 negative etiology of ab pain   3. Chronic pain low back, neck s/p surgery Dr. Lacinda Axon and osteitis pubis wants to do PT through [facility] ortho will message them as well  With abnormal MRI imaging and Xrays  Pain is 8/10   Review of Systems  Constitutional: Negative for weight loss.  HENT: Negative for hearing loss.   Eyes: Positive for blurred vision.  Respiratory: Negative for shortness of breath.   Cardiovascular: Negative for chest pain.  Gastrointestinal: Positive for abdominal pain and blood in stool.  Musculoskeletal: Positive for back pain and neck pain.  Skin: Negative for rash.  Neurological: Negative for headaches.  Psychiatric/Behavioral: Positive for depression. The patient is nervous/anxious.        GAD 7 and PHQ scores 9 today    Past Medical History:  Diagnosis Date  . Allergy   . Anemia   . Arthritis   . Asthma   . Bacterial vaginitis   . Blood in stool    . Brachial neuritis or radiculitis NOS   . Brain tumor (benign) ([place]) 07/2017   near optic nerve. being followed by neurosurgery/eye doctor and pcp. monitoring size.causes sinus problems  . Bronchitis   . Cardiac arrhythmia   . Cervical neck pain with evidence of disc disease    C5/6 disease, MRI done late 2012 - no records available  . Chronic combined systolic and diastolic CHF (congestive heart failure) ([place])    a. 08/2010 Echo: mildly reduced EF 40-45%, mild diffuse hypokinesis; b. 06/2016 Echo: EF 50%, no rwma, mild to mod TR; c. 03/2017 Echo: EF 40-45%, Gr1 DD (prior echo reviewed and EF felt to be lower than reported).  . Chronic pain   . Cocaine abuse, in remission ([place])    clean x 24 years  . Colon polyps   . COPD (chronic obstructive pulmonary disease) ([place])    a. 12/2018 PFT: No obvious obst/restrictive dzs.  . Coronary artery disease    a. PCI of LCX 2003; b. PCI of the LAD 2012 with a (2.5 x 8 mm BMS);  c.s/p CABG 4/12: L-LAD, VG-Dx, VG-OM, VG-RCA (Dr. Prescott Gum);  d. 01/2012 MV: inf infarct, attenuation, no ischemia; e. 02/2017 MV: signifi attenuation artifact. Fixed basal antlat/inflat scar vs artifact. Reversible apical lat and mid antlat defect - ? atten vs ischemia. F/u echo w/o  wma->Med rx.  . Depression   . Diabetes mellitus without complication ([place])   . Family history of colon cancer   . Generalized headaches    frequent  . GERD (gastroesophageal reflux disease)   . Headache   . Heart murmur   . Hematochezia   . Hepatitis    history of hepatitis b  . History of cervical cancer    s/p cryotherapy  . History of drug abuse ([place])    cocaine, marijuana, clean since 1989  . History of hepatitis B    from eating undercooked liver  . History of MI (myocardial infarction)   . Hyperlipidemia   . Hypertension   . Ischemic cardiomyopathy    a. 03/2017 Echo: EF 40-45%, Gr1 DD.  [name] Myocardial infarction [facility]) 2003, 2012  . PAD (peripheral artery disease) ([place])    a. s/p  Right SFA atherectomy and PTA 01/15/11; b. 07/2018 ABI: R 1.02, L 1.09.  [name] Pituitary mass ([place])    a. 12/2018 MRI Brain: Stable pituitary mass w/ 53mm area of necrosis. Mass abuts R cavernous sinus w/o definite invasion.  . Polyp of colon   . Seasonal allergies   . Sleep apnea    uses cpap  . Smoking history    quit 07/2010  . Thyroid disease   . Urinary incontinence    Past Surgical History:  Procedure Laterality Date  . ABDOMINAL HYSTERECTOMY     2003  . CHOLECYSTECTOMY  1986  . COLONOSCOPY  2008   3 polyps  . COLONOSCOPY WITH PROPOFOL N/A 02/06/2015   Procedure: COLONOSCOPY WITH PROPOFOL;  Surgeon: Lucilla Lame, MD;  Location: [facility] ENDOSCOPY;  Service: Endoscopy;  Laterality: N/A;  . COLONOSCOPY WITH PROPOFOL N/A 01/27/2017   Procedure: COLONOSCOPY WITH PROPOFOL;  Surgeon: Lucilla Lame, MD;  Location: [facility] ENDOSCOPY;  Service: Endoscopy;  Laterality: N/A;  . CORONARY ANGIOPLASTY     w/ stent placement x2  . CORONARY ARTERY BYPASS GRAFT  2012   Dr [name]  . CORONARY STENT PLACEMENT  2003   S/P MI  . CORONARY STENT PLACEMENT  2007   [place]  . ESOPHAGOGASTRODUODENOSCOPY (EGD) WITH PROPOFOL N/A 02/06/2015   Procedure: ESOPHAGOGASTRODUODENOSCOPY (EGD) WITH PROPOFOL;  Surgeon: Lucilla Lame, MD;  Location: [facility] ENDOSCOPY;  Service: Endoscopy;  Laterality: N/A;  . ESOPHAGOGASTRODUODENOSCOPY (EGD) WITH PROPOFOL N/A 01/27/2017   Procedure: ESOPHAGOGASTRODUODENOSCOPY (EGD) WITH PROPOFOL;  Surgeon: Lucilla Lame, MD;  Location: [facility] ENDOSCOPY;  Service: Endoscopy;  Laterality: N/A;  . ESOPHAGOGASTRODUODENOSCOPY (EGD) WITH PROPOFOL N/A 09/01/2019   Procedure: ESOPHAGOGASTRODUODENOSCOPY (EGD) WITH PROPOFOL;  Surgeon: Lin Landsman, MD;  Location: [facility] ENDOSCOPY;  Service: Gastroenterology;  Laterality: N/A;  . FEMORAL ARTERY STENT  10/2010   right sided (Dr. Burt Knack)  . KNEE ARTHROSCOPY WITH MEDIAL MENISECTOMY Right 08/20/2017   Procedure: KNEE ARTHROSCOPY WITH MEDIAL  AND LATERAL MENISECTOMY;   Surgeon: Hessie Knows, MD;  Location: [facility] ORS;  Service: Orthopedics;  Laterality: Right;  . POSTERIOR CERVICAL LAMINECTOMY N/A 03/08/2018   Procedure: POSTERIOR CERVICAL LAMINECTOMY-C7;  Surgeon: Deetta Perla, MD;  Location: [facility] ORS;  Service: Neurosurgery;  Laterality: N/A;  . TONSILLECTOMY    . TOTAL KNEE ARTHROPLASTY Right 04/14/2019   Procedure: RIGHT TOTAL KNEE ARTHROPLASTY;  Surgeon: Hessie Knows, MD;  Location: [facility] ORS;  Service: Orthopedics;  Laterality: Right;  . TUBAL LIGATION     Family History  Problem Relation Age of Onset  . Hypertension Father   . Heart failure Father   . Diabetes Father   . Colon cancer  Father 44  . Glaucoma Father   . Cancer Father        colorectal   . Heart disease Father   . Other Father        glaucoma  . Breast cancer Mother 31       breast cancer, late 10's  . Cancer Mother        breast  . Brain cancer Sister   . Arthritis Sister   . Diabetes Sister   . Hypertension Sister   . Kidney disease Sister   . Diabetes Brother   . Hypertension Brother   . Other Sister        brain tumor   . Acute myelogenous leukemia Grandson        06/2019  . Coronary artery disease Neg Hx   . Stroke Neg Hx    Social History   Socioeconomic History  . Marital status: Legally Separated    Spouse name: Not on file  . Number of children: Not on file  . Years of education: Not on file  . Highest education level: Not on file  Occupational History  . Not on file  Tobacco Use  . Smoking status: Former Smoker    Packs/day: 1.00    Years: 38.00    Pack years: 38.00    Types: Cigarettes    Quit date: 08/12/2010    Years since quitting: 9.5  . Smokeless tobacco: Never Used  Vaping Use  . Vaping Use: Never used  Substance and Sexual Activity  . Alcohol use: No  . Drug use: No    Types: Cocaine    Comment: Remote Hx (crack cocaine and marijuana).none since 55yrs plus  . Sexual activity: Yes  Other Topics Concern  . 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 Difficulty of Paying Living Expenses: Not hard at all  Food Insecurity: No Food Insecurity  . Worried About Charity fundraiser in the Last Year: Never true  . Ran Out of Food in the Last Year: Never true  Transportation Needs: No Transportation Needs  . Lack of Transportation (Medical): No  . Lack of Transportation (Non-Medical): No  Physical Activity:   . Days of Exercise per Week:   . Minutes of Exercise per Session:   Stress: No Stress Concern Present  . Feeling of Stress : Not at all  Social Connections: Unknown  . Frequency of Communication with Friends and Family: More than three times a week  . Frequency of Social Gatherings with Friends and Family: Once a week  . Attends Religious Services: 1 to 4 times per year  . Active Member of Clubs or Organizations: Yes  . Attends Archivist Meetings: More than 4 times per year  . Marital Status: Not on file  Intimate Partner Violence: Not At Risk  . Fear of Current or Ex-Partner: No  . Emotionally Abused: No  . Physically Abused: No  . Sexually Abused: No  Current Meds  Medication Sig  . albuterol (VENTOLIN HFA) 108 (90 Base) MCG/ACT inhaler Inhale 2 puffs into the lungs every 6 (six) hours as needed for wheezing or shortness of breath.  [name] aspirin EC 81 MG tablet Take 1 tablet (81 mg total) by mouth daily.  . Azelastine HCl 0.15 % SOLN Place 2 sprays into both nostrils daily as needed (allergies).   . cyclobenzaprine (FLEXERIL) 5 MG tablet Take 1-2 tablets (5-10 mg total) by mouth at bedtime  as needed for muscle spasms.  [name] dicyclomine (BENTYL) 10 MG capsule Take 1 capsule (10 mg total) by mouth 3 (three) times daily before meals. As needed for abdominal pain  . esomeprazole (NEXIUM) 40 MG capsule Take 1 capsule (40 mg total) by mouth daily. Reported on 09/04/2015  . Evolocumab (REPATHA SURECLICK) 322 MG/ML SOAJ Inject 1 pen into the skin every 14 (fourteen) days.  [name] ezetimibe (ZETIA) 10 MG tablet Take 1 tablet by mouth once daily  . fluticasone (FLONASE) 50 MCG/ACT nasal spray Place 2 sprays into both nostrils daily as needed for allergies.   . fluticasone (FLOVENT HFA) 110 MCG/ACT inhaler Inhale 1 puff into the lungs 2 (two) times a day. Rinse mouth  . furosemide (LASIX) 20 MG tablet TAKE 1 TABLET BY MOUTH ONCE DAILY AND A SECOND TABLET IF NEEDED FOR FLUID BUILD UP  . glucose blood (ONE TOUCH ULTRA TEST) test strip Use as instructed check cbg qd E11.9  . hydrOXYzine (ATARAX/VISTARIL) 25 MG tablet Take 1-2 tablets (25-50 mg total) by mouth daily as needed.  [name] ipratropium-albuterol (DUONEB) 0.5-2.5 (3) MG/3ML SOLN Take 3 mLs by nebulization 2 (two) times daily as needed (wheezing/shortness of breath).  . isosorbide mononitrate (IMDUR) 30 MG 24 hr tablet Take 1 tablet by mouth twice daily  . Lancets MISC 1 Device by Does not apply route daily. Lancets E11.9  . levocetirizine (XYZAL) 5 MG tablet Take 1 tablet (5 mg total) by mouth at bedtime as needed for allergies.  [name] meclizine (ANTIVERT) 25 MG tablet Take 25 mg by mouth 3 (three) times daily.   . metFORMIN (GLUCOPHAGE) 500 MG tablet Take 1 tablet (500 mg total) by mouth daily with breakfast.  . methocarbamol (ROBAXIN) 500 MG tablet Take 1 tablet (500 mg total) by mouth every 6 (six) hours as needed for muscle spasms.  . mirabegron ER (MYRBETRIQ) 25 MG TB24 tablet Take 1 tablet (25 mg total) by mouth daily. (Patient taking differently: Take 25 mg by mouth daily as needed (urinary frequency). )  . nitroGLYCERIN (NITROSTAT) 0.4 MG SL tablet  Place 1 tablet (0.4 mg total) under the tongue every 5 (five) minutes as needed.  . nystatin (MYCOSTATIN) 100000 UNIT/ML suspension Use as directed 5 mLs (500,000 Units total) in the mouth or throat 4 (four) times daily.  . ondansetron (ZOFRAN) 4 MG tablet Take 1 tablet (4 mg total) by mouth every 8 (eight) hours as needed for nausea or vomiting.  [name] PARoxetine (PAXIL) 20 MG tablet Take 1 tablet (20 mg total) by mouth daily.  . potassium chloride (KLOR-CON) 10 MEQ tablet Take 1 tablet (10 mEq total) by mouth daily.  . predniSONE (DELTASONE) 20 MG tablet Take 2 tablets (40 mg total) by mouth daily with breakfast. With food  . rosuvastatin (CRESTOR) 20 MG tablet Take 1 tablet (20 mg total) by mouth at bedtime.  [name] umeclidinium-vilanterol (ANORO ELLIPTA) 62.5-25 MCG/INH AEPB Inhale 1 puff into the lungs daily.  Alveda Reasons 2.5 MG TABS tablet Take 1 tablet  by mouth twice daily   Allergies  Allergen Reactions  . Latex Rash       . Shellfish Allergy Anaphylaxis    Hard shellfish/swelling in throat  . Sulfonamide Derivatives Anaphylaxis    Swelling in throat.  . Watermelon [Citrullus Vulgaris] Other (See Comments)    Throat itchy  . Soy Allergy     Diarrhea/upset stomach    . Other Swelling   Recent Results (from the past 2160 hour(s))  Urine cytology ancillary only([place])     Status: None   Collection Time: 12/02/19 12:03 PM  Result Value Ref Range   Bacterial Vaginitis-Urine Negative    Candida Urine Negative    Objective  Body mass index is 34.31 kg/m. Wt Readings from Last 3 Encounters:  02/21/20 (!) 206 lb 3.2 oz (93.5 kg)  12/15/19 208 lb (94.3 kg)  12/02/19 206 lb 12.8 oz (93.8 kg)   Temp Readings from Last 3 Encounters:  02/21/20 98.1 F (36.7 C) (Oral)  12/15/19 (!) 97.5 F (36.4 C) (Temporal)  12/02/19 (!) 97.4 F (36.3 C) (Temporal)   BP Readings from Last 3 Encounters:  02/21/20 112/82  12/15/19 126/74  12/02/19 100/70   Pulse Readings from Last 3  Encounters:  02/21/20 63  12/15/19 80  12/02/19 76    Physical Exam Vitals and nursing note reviewed.  Constitutional:      Appearance: Normal appearance. She is well-developed and well-groomed. She is obese.  HENT:     Head: Normocephalic and atraumatic.  Cardiovascular:     Rate and Rhythm: Normal rate and regular rhythm.     Heart sounds: Normal heart sounds. No murmur heard.   Pulmonary:     Effort: Pulmonary effort is normal.     Breath sounds: Normal breath sounds.  Abdominal:     General: Abdomen is flat. Bowel sounds are normal.     Tenderness: There is abdominal tenderness in the suprapubic area.    Skin:    General: Skin is warm and dry.  Neurological:     General: No focal deficit present.     Mental Status: She is alert and oriented to person, place, and time. Mental status is at baseline.     Gait: Gait normal.  Psychiatric:        Attention and Perception: Attention and perception normal.        Mood and Affect: Mood and affect normal.        Speech: Speech normal.        Behavior: Behavior normal. Behavior is cooperative.        Thought Content: Thought content normal.        Cognition and Memory: Cognition and memory normal.        Judgment: Judgment normal.     Assessment  Plan  Pituitary macroadenoma ([place]) His comment below: Dr. Deetta Perla  Surgery is definitely an option, I do think she needs updated ophthalmologic evaluation.  If she is not having any visual symptoms, that amount of growth is typically negligible over that amount of time as the MRI slices are usually 1 to 2 mm.  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[place]) F/u  [facility] will  ask if they can coordinate PT   Internal hemorrhoids - Plan: Ambulatory referral to Gastroenterology Blood in stool - Plan: Ambulatory referral to Gastroenterology Lower abdominal pain - Plan: Ambulatory referral to Gastroenterology R/o UTI  Thrush try biotene mouthwash otc mouth is dry which could set up from thrust  Benign breast cyst in female, right  04/2020 [place] scheduled to f/u this   HM Flu shotutd 10/8/20utd Considershingrix in futureat pharmacy pna 23 had 03/10/16 covid 2/2 pfizer  Consider prevnar in future  S/p hysterectomy no cervix and f/u OB/GYNh/o abnormal pap s/p cryo  -pap 05/08/15 neg papwill Repeat In 5 years   Colonoscopy Dr. Allen Norris 01/2017 polyps, gastritis, diverticulosis referred today   Mammogram3/18/2020 negativere orderedsch 09/01/19 left dx mammogram As of 3 or 10/28/19 utd benign cyst likely right breast rec [place] in 6 months to f/u ordered and sch  DEXAneg 10/13/2018  Provider: Dr. Olivia Mackie [name]-Internal Medicine

## 2020-02-22 ENCOUNTER — Telehealth: Payer: Self-pay | Admitting: Internal Medicine

## 2020-02-22 DIAGNOSIS — M545 Low back pain, unspecified: Secondary | ICD-10-CM | POA: Insufficient documentation

## 2020-02-22 DIAGNOSIS — N6001 Solitary cyst of right breast: Secondary | ICD-10-CM | POA: Insufficient documentation

## 2020-02-22 DIAGNOSIS — K648 Other hemorrhoids: Secondary | ICD-10-CM | POA: Insufficient documentation

## 2020-02-22 DIAGNOSIS — G8929 Other chronic pain: Secondary | ICD-10-CM | POA: Insufficient documentation

## 2020-02-22 HISTORY — DX: Solitary cyst of right breast: N60.01

## 2020-02-22 LAB — URINE CULTURE
MICRO NUMBER:: 10754645
Result:: NO GROWTH
SPECIMEN QUALITY:: ADEQUATE

## 2020-02-22 LAB — URINALYSIS, ROUTINE W REFLEX MICROSCOPIC
Bilirubin Urine: NEGATIVE
Glucose, UA: NEGATIVE
Hgb urine dipstick: NEGATIVE
Ketones, ur: NEGATIVE
Leukocytes,Ua: NEGATIVE
Nitrite: NEGATIVE
Protein, ur: NEGATIVE
Specific Gravity, Urine: 1.012 (ref 1.001–1.03)
pH: 6 (ref 5.0–8.0)

## 2020-02-22 NOTE — Telephone Encounter (Signed)
Patient informed and verbalized understanding.  States she will like to see Dr Lacinda Axon again to go over MRI and options

## 2020-02-22 NOTE — Telephone Encounter (Signed)
Per neurosurgery Dr. Lacinda Axon thank you for your response   His comment below:  Surgery is definitely an option, I do think she needs updated ophthalmologic/eye evaluation.   -pt does c/o blurry vision due to see Dr. George Ina [place] [facility]    If she is not having any visual symptoms, that amount of growth is typically negligible over that amount of time as the MRI slices are usually 1 to 2 mm.  I am glad to see her again to go over the MRI if she would like, may save her trip to [facility]    Does she want to see Dr. Lacinda Axon or referral to [facility] Dr. Johnney Killian for surgery evaluation ?

## 2020-02-24 NOTE — Addendum Note (Signed)
Addended by: Orland Mustard on: 02/24/2020 06:09 PM   Modules accepted: Orders

## 2020-02-29 ENCOUNTER — Other Ambulatory Visit: Payer: Self-pay | Admitting: Internal Medicine

## 2020-02-29 DIAGNOSIS — R32 Unspecified urinary incontinence: Secondary | ICD-10-CM

## 2020-02-29 MED ORDER — MIRABEGRON ER 25 MG PO TB24: 25.0000 mg | ORAL_TABLET | Freq: Every day | ORAL | 11 refills | Status: DC

## 2020-03-06 DIAGNOSIS — D497 Neoplasm of unspecified behavior of endocrine glands and other parts of nervous system: Secondary | ICD-10-CM | POA: Diagnosis not present

## 2020-03-07 ENCOUNTER — Other Ambulatory Visit: Payer: Self-pay | Admitting: Internal Medicine

## 2020-03-07 DIAGNOSIS — J449 Chronic obstructive pulmonary disease, unspecified: Secondary | ICD-10-CM

## 2020-03-07 MED ORDER — ANORO ELLIPTA 62.5-25 MCG/INH IN AEPB: 1.0000 | INHALATION_SPRAY | Freq: Every day | RESPIRATORY_TRACT | 12 refills | Status: DC

## 2020-03-17 DIAGNOSIS — I259 Chronic ischemic heart disease, unspecified: Secondary | ICD-10-CM | POA: Diagnosis not present

## 2020-03-17 DIAGNOSIS — G4733 Obstructive sleep apnea (adult) (pediatric): Secondary | ICD-10-CM | POA: Diagnosis not present

## 2020-03-17 DIAGNOSIS — G4710 Hypersomnia, unspecified: Secondary | ICD-10-CM | POA: Diagnosis not present

## 2020-03-20 ENCOUNTER — Ambulatory Visit: Payer: Medicare HMO | Admitting: Gastroenterology

## 2020-03-30 DIAGNOSIS — G4733 Obstructive sleep apnea (adult) (pediatric): Secondary | ICD-10-CM | POA: Diagnosis not present

## 2020-04-05 ENCOUNTER — Ambulatory Visit: Payer: Medicare HMO | Admitting: Internal Medicine

## 2020-04-05 ENCOUNTER — Ambulatory Visit (INDEPENDENT_AMBULATORY_CARE_PROVIDER_SITE_OTHER): Payer: Medicare HMO | Admitting: Pulmonary Disease

## 2020-04-05 ENCOUNTER — Encounter: Payer: Self-pay | Admitting: Pulmonary Disease

## 2020-04-05 ENCOUNTER — Other Ambulatory Visit: Payer: Self-pay

## 2020-04-05 VITALS — BP 108/68 | HR 82 | Temp 96.9°F | Ht 65.0 in | Wt 212.0 lb

## 2020-04-05 DIAGNOSIS — G4733 Obstructive sleep apnea (adult) (pediatric): Secondary | ICD-10-CM

## 2020-04-05 DIAGNOSIS — J449 Chronic obstructive pulmonary disease, unspecified: Secondary | ICD-10-CM

## 2020-04-05 MED ORDER — TRELEGY ELLIPTA 100-62.5-25 MCG/INH IN AEPB: 1.0000 | INHALATION_SPRAY | Freq: Every day | RESPIRATORY_TRACT | 5 refills | Status: DC

## 2020-04-05 MED ORDER — MONTELUKAST SODIUM 10 MG PO TABS
10.0000 mg | ORAL_TABLET | Freq: Every day | ORAL | 5 refills | Status: DC
Start: 2020-04-05 — End: 2020-07-10

## 2020-04-05 NOTE — Patient Instructions (Signed)
Trelegy one puff daily Montelukast (singulair) 10 mg pill nightly during allergy season Call if you are still having trouble with CPAP after you get new mask  Follow up in 6 months

## 2020-04-05 NOTE — Progress Notes (Signed)
[facility]  Chief Complaint  Patient presents with  . Follow-up    Patient has sleep apnea and wears CPAP but has not been able to wear it because she needs a latex free mask which was ordered at last visit in May and she states she has not recieved it. Shortness of breath with exertion     Constitutional:  BP 108/68 (BP Location: Left Arm, Patient Position: Sitting, Cuff Size: Normal)   Pulse 82   Temp (!) 96.9 F (36.1 C) (Temporal)   Ht 5\' 5"  (1.651 m)   Wt 212 lb (96.2 kg)   SpO2 100%   BMI 35.28 kg/m   Past Medical History:  HLD, GERD, Depression, HTN, Anxiety, Depression, Allergies, OA, Pituitary adenoma, Combined CHF, CAD s/p CABG, DM type 2, HA, Hep B, Cervical cancer, PAD, Colon polyps  Past Surgical History:  Her  has a past surgical history that includes Coronary artery bypass graft (2012); Coronary stent placement (2003); Coronary stent placement (2007); Femoral artery stent (10/2010); Coronary angioplasty; Colonoscopy (2008); Colonoscopy with propofol (N/A, 02/06/2015); Esophagogastroduodenoscopy (egd) with propofol (N/A, 02/06/2015); Colonoscopy with propofol (N/A, 01/27/2017); Esophagogastroduodenoscopy (egd) with propofol (N/A, 01/27/2017); Tubal ligation; Knee arthroscopy with medial menisectomy (Right, 08/20/2017); Abdominal hysterectomy; Cholecystectomy (1986); Posterior cervical laminectomy (N/A, 03/08/2018); Tonsillectomy; Total knee arthroplasty (Right, 04/14/2019); and Esophagogastroduodenoscopy (egd) with propofol (N/A, 09/01/2019).  Brief Summary:  [name] is a 59 y.o. female with former smoker with allergic asthma and rhinitis, and obstructive sleep apnea.      Subjective:  She was previously followed by Dr. Alva Garnet.    She has not been able to use CPAP for past several months.  She needs to use a latex free mask, but hasn't received a replacement mask yet.  As a result she is having trouble with sleeping and  snoring, and feeling tired during the day.  She has sinus congestion with post nasal drip.  This happens in the late Summer/early Fall.  She gets throat irritation and then cough with clear sputum.  Not having fever, hemoptysis, or wheezing.  Physical Exam:   Appearance - well kempt   ENMT - no sinus tenderness, no oral exudate, no LAN, Mallampati 4 airway, no stridor, high arched palate, clear nasal drainage   Respiratory - equal breath sounds bilaterally, no wheezing or rales  CV - s1s2 regular rate and rhythm, no murmurs  Ext - no clubbing, no edema  Skin - no rashes  Psych - normal mood and affect   Pulmonary testing:   PFT 11/07/10 >> FEV1 2.73 (110%), FEV1% 80, TLC 5.41 (101%), DLCO 68%  PFT 05/16/16 >> FEV1 1.98 (88%), FEV1% 79, TLC 5.10 (98%), DLCO 63%  Chest Imaging:   CT angio chest 06/16/17 >> atherosclerosis, s/p CABG, ATX  Sleep Tests:   PSG 05/06/16 >> AHI 6.8, SpO2 low 87%  HST 01/11/19 >> AHI 26.7, SpO2 low 62%  Cardiac Tests:   Echo 04/24/17 >> EF 40 to 45%, grade 1 DD  Social History:  She  reports that she quit smoking about 9 years ago. Her smoking use included cigarettes. She has a 38.00 pack-year smoking history. She has never used smokeless tobacco. She reports that she does not drink alcohol and does not use drugs.  Family History:  Her family history includes Acute myelogenous leukemia in her grandson; Arthritis in her sister; Brain cancer in her sister; Breast cancer (age of onset: 16) in her mother; Cancer in her father and  mother; Colon cancer (age of onset: 67) in her father; Diabetes in her brother, father, and sister; Glaucoma in her father; Heart disease in her father; Heart failure in her father; Hypertension in her brother, father, and sister; Kidney disease in her sister; Other in her father and sister.     Assessment/Plan:   Allergic asthma. - will try combining her inhalers - will have her start trelegy 100 one puff daily and this  will take the place of flovent and anoro - add singulair during allergy season - prn ventolin, duoneb  Allergic rhinitis. - add singulair - continue azelastine, flonase, xyzal  Obstructive sleep apnea. - she needs to get a new latex free CPAP mask so she can resume using CPAP again - uses [employer] for her DME - continue CPAP 10 cm H2O  CAD s/p CABG, HTN, HLD, Ischemic cardiomyopathy with chronic combined CHF. - followed by Dr. Ida Rogue with [facility]  Pituitary tumor. - followed by Dr. Deetta Perla with neurosurgery  Time Spent Involved in Patient Care on Day of Examination:  32 minutes  Follow up:  Patient Instructions  Trelegy one puff daily Montelukast (singulair) 10 mg pill nightly during allergy season Call if you are still having trouble with CPAP after you get new mask  Follow up in 6 months   Medication List:   Allergies as of 04/05/2020      Reactions   Latex Rash      Shellfish Allergy Anaphylaxis   Hard shellfish/swelling in throat   Sulfonamide Derivatives Anaphylaxis   Swelling in throat.   Watermelon [citrullus Vulgaris] Other (See Comments)   Throat itchy   Soy Allergy    Diarrhea/upset stomach    Other Swelling      Medication List       Accurate as of April 05, 2020 10:52 AM. If you have any questions, ask your nurse or doctor.        STOP taking these medications   Anoro Ellipta 62.5-25 MCG/INH Aepb Generic drug: umeclidinium-vilanterol Stopped by: Chesley Mires, MD   fluticasone 110 MCG/ACT inhaler Commonly known as: Flovent HFA Stopped by: Chesley Mires, MD   metFORMIN 500 MG tablet Commonly known as: GLUCOPHAGE Stopped by: Chesley Mires, MD   predniSONE 20 MG tablet Commonly known as: DELTASONE Stopped by: Chesley Mires, MD     TAKE these medications   albuterol 108 (90 Base) MCG/ACT inhaler Commonly known as: VENTOLIN HFA Inhale 2 puffs into the lungs every 6 (six) hours as needed for wheezing or shortness of breath.    aspirin EC 81 MG tablet Take 1 tablet (81 mg total) by mouth daily.   Azelastine HCl 0.15 % Soln Place 2 sprays into both nostrils daily as needed (allergies).   cyclobenzaprine 5 MG tablet Commonly known as: FLEXERIL Take 1-2 tablets (5-10 mg total) by mouth at bedtime as needed for muscle spasms.   dicyclomine 10 MG capsule Commonly known as: Bentyl Take 1 capsule (10 mg total) by mouth 3 (three) times daily before meals. As needed for abdominal pain   esomeprazole 40 MG capsule Commonly known as: NEXIUM Take 1 capsule (40 mg total) by mouth daily. Reported on 09/04/2015   ezetimibe 10 MG tablet Commonly known as: ZETIA Take 1 tablet by mouth once daily   fluticasone 50 MCG/ACT nasal spray Commonly known as: FLONASE Place 2 sprays into both nostrils daily as needed for allergies.   furosemide 20 MG tablet Commonly known as: LASIX TAKE 1 TABLET  BY MOUTH ONCE DAILY AND A SECOND TABLET IF NEEDED FOR FLUID BUILD UP   glucose blood test strip Commonly known as: ONE TOUCH ULTRA TEST Use as instructed check cbg qd E11.9   hydrOXYzine 25 MG tablet Commonly known as: ATARAX/VISTARIL Take 1-2 tablets (25-50 mg total) by mouth daily as needed.   ipratropium-albuterol 0.5-2.5 (3) MG/3ML Soln Commonly known as: DUONEB Take 3 mLs by nebulization 2 (two) times daily as needed (wheezing/shortness of breath).   isosorbide mononitrate 30 MG 24 hr tablet Commonly known as: IMDUR Take 1 tablet by mouth twice daily   Lancets Misc 1 Device by Does not apply route daily. Lancets E11.9   levocetirizine 5 MG tablet Commonly known as: Xyzal Take 1 tablet (5 mg total) by mouth at bedtime as needed for allergies.   linaclotide 72 MCG capsule Commonly known as: LINZESS Take 1 capsule (72 mcg total) by mouth daily before breakfast.   losartan 25 MG tablet Commonly known as: COZAAR Take 1 tablet (25 mg total) by mouth daily. What changed: when to take this   meclizine 25 MG  tablet Commonly known as: ANTIVERT Take 25 mg by mouth 3 (three) times daily.   methocarbamol 500 MG tablet Commonly known as: ROBAXIN Take 1 tablet (500 mg total) by mouth every 6 (six) hours as needed for muscle spasms.   metoprolol succinate 50 MG 24 hr tablet Commonly known as: TOPROL-XL Take 1 tablet (50 mg total) by mouth daily. Take with or immediately following a meal.   mirabegron ER 25 MG Tb24 tablet Commonly known as: Myrbetriq Take 1 tablet (25 mg total) by mouth daily.   montelukast 10 MG tablet Commonly known as: SINGULAIR Take 1 tablet (10 mg total) by mouth at bedtime. Started by: Chesley Mires, MD   nitroGLYCERIN 0.4 MG SL tablet Commonly known as: NITROSTAT Place 1 tablet (0.4 mg total) under the tongue every 5 (five) minutes as needed.   nystatin 100000 UNIT/ML suspension Commonly known as: MYCOSTATIN Use as directed 5 mLs (500,000 Units total) in the mouth or throat 4 (four) times daily.   ondansetron 4 MG tablet Commonly known as: Zofran Take 1 tablet (4 mg total) by mouth every 8 (eight) hours as needed for nausea or vomiting.   PARoxetine 20 MG tablet Commonly known as: PAXIL Take 1 tablet (20 mg total) by mouth daily.   potassium chloride 10 MEQ tablet Commonly known as: KLOR-CON Take 1 tablet (10 mEq total) by mouth daily.   Repatha SureClick 381 MG/ML Soaj Generic drug: Evolocumab Inject 1 pen into the skin every 14 (fourteen) days.   rosuvastatin 20 MG tablet Commonly known as: CRESTOR Take 1 tablet (20 mg total) by mouth at bedtime.   Trelegy Ellipta 100-62.5-25 MCG/INH Aepb Generic drug: Fluticasone-Umeclidin-Vilant Inhale 1 puff into the lungs daily. Started by: Chesley Mires, MD   Xarelto 2.5 MG Tabs tablet Generic drug: rivaroxaban Take 1 tablet by mouth twice daily       Signature:  Chesley Mires, MD [place] Pager - [phone] 04/05/2020, 10:52 AM

## 2020-04-17 ENCOUNTER — Encounter: Payer: Self-pay | Admitting: Gastroenterology

## 2020-04-17 ENCOUNTER — Other Ambulatory Visit: Payer: Self-pay

## 2020-04-17 ENCOUNTER — Ambulatory Visit (INDEPENDENT_AMBULATORY_CARE_PROVIDER_SITE_OTHER): Payer: Medicare HMO | Admitting: Gastroenterology

## 2020-04-17 VITALS — BP 112/78 | HR 79 | Temp 98.1°F | Ht 65.0 in | Wt 209.4 lb

## 2020-04-17 DIAGNOSIS — K5909 Other constipation: Secondary | ICD-10-CM | POA: Diagnosis not present

## 2020-04-17 DIAGNOSIS — I259 Chronic ischemic heart disease, unspecified: Secondary | ICD-10-CM | POA: Diagnosis not present

## 2020-04-17 DIAGNOSIS — G4710 Hypersomnia, unspecified: Secondary | ICD-10-CM | POA: Diagnosis not present

## 2020-04-17 DIAGNOSIS — R1032 Left lower quadrant pain: Secondary | ICD-10-CM | POA: Diagnosis not present

## 2020-04-17 DIAGNOSIS — K219 Gastro-esophageal reflux disease without esophagitis: Secondary | ICD-10-CM

## 2020-04-17 DIAGNOSIS — G4733 Obstructive sleep apnea (adult) (pediatric): Secondary | ICD-10-CM | POA: Diagnosis not present

## 2020-04-17 NOTE — Progress Notes (Signed)
Cephas Darby, MD [address]  [place], [name] [address]  Main: [phone]  Fax: [phone]    Gastroenterology Consultation  Referring Provider:     [name], Olivia Mackie * Primary Care Physician:  [name], [name], MD Primary Gastroenterologist:  Dr. Cephas Darby Reason for Consultation:   Left lower quadrant pain, chronic constipation        HPI:   [name] is a 59 y.o. female referred by Dr. Terese Door, [name], MD  for consultation & management of diffuse quadrant pain, chronic constipation.  Patient has history of chronic constipation almost all her life and fecal impaction at age of 60.  Patient saw Dr. Terese Door on 08/16/2019 secondary to severe left sided abdominal pain associated with nausea.  She was started on MiraLAX, Bentyl as well as empirically treated for acute sigmoid diverticulitis with 10 days course of Cipro and Flagyl.  Patient reports that she finished antibiotics however, pain persisted and she feels like something stuck in her left upper quadrant and has to come out, pain is predominantly in the left upper quadrant radiating to the back and lower abdomen.  She denies fever, chills.  She does feel nauseous, denies vomiting.  She denies abdominal bloating, rectal bleeding.  She reports her stools are watery, has tried enema and suppository this does not work. She does not smoke or drink alcohol Known history of sigmoid diverticulosis  Follow-up video visit 09/07/2019 I initially saw [name] on 08/25/2019 due to severe left lower quadrant pain, severe constipation and she was empirically being treated for acute sigmoid diverticulitis.  Repeat CT scan did not reveal diverticulitis.  Upper endoscopy was unremarkable as well.  I treated her constipation with GoLYTELY followed by Linzess 145 MCG and high-fiber diet with fiber supplements.  She reports that she is responding very well to Linzess and fiber supplements.  She  is taking Linzess every day and her bowel movements are now formed.  She reports that her left lower quadrant pain is gradually improving.  She is very pleased with the way her symptoms are improving.  She drinks sodas daily and eats red meat regularly.  Follow-up visit 11/15/2019 I started her on Linzess 145 MCG daily which worked very well initially, however she was having 4-5 loose bowel movements daily.  So, her PCP switched to Linzess 72 MCG daily.  She is currently having 2 bowel movements daily.  She does have mild left lower quadrant discomfort.  She has admitted drinking sodas.  Currently drinking more water.  She has been stressed out recently as her grandson passed away from AML at age 53.  She is currently going through grief response and worried about her daughter.  She does report intermittent epigastric discomfort and frequent belching.  She is currently on Nexium 40 mg daily.  Her weight has been stable  Follow-up visit 04/17/2020 Patient reports doing well overall.  Linzess 20 MCG is working well for her.  She reports mild left-sided discomfort.  She is trying to adopt healthy lifestyle.  Her sister who is on dialysis lives with her.  She does not have any other concerns today.  She does not smoke or drink alcohol  NSAIDs: None  Antiplts/Anticoagulants/Anti thrombotics: None  GI Procedures:  EGD 09/01/2019 - Normal duodenal bulb and second portion of the duodenum. - Small hiatal hernia. - Normal stomach. - Normal gastroesophageal junction and esophagus. - No specimens collected.  Antiplts/Anticoagulants/Anti thrombotics:None  EGD and colonoscopy in 2018  by Dr. Allen Norris - Small hiatal hernia. - Non-bleeding gastric ulcers with no stigmata of bleeding. Biopsied. - Gastritis. Biopsied. - Normal examined duodenum.  - Two 2 to 4 mm polyps in the sigmoid colon, removed with a cold snare. Resected and retrieved. - One 4 mm polyp in the ascending colon, removed with a cold  snare. Resected and retrieved. - Diverticulosis in the sigmoid colon. - Non-bleeding internal hemorrhoids.  DIAGNOSIS:  A. STOMACH ULCERATION; COLD BIOPSY:  - MILD ACTIVE GASTRITIS WITH ULCERATION.  - NEGATIVE FOR DYSPLASIA AND MALIGNANCY.   ADDENDUM:  Immunohistochemical stain for H. pylori is negative with an appropriate  control.   B. COLON POLYP, ASCENDING; COLD SNARE:  - TUBULAR ADENOMA.  - NEGATIVE FOR HIGH-GRADE DYSPLASIA AND MALIGNANCY.   C. COLON POLYP 2, SIGMOID; COLD SNARE:  - TUBULAR ADENOMAS (2).  - NEGATIVE FOR HIGH-GRADE DYSPLASIA AND MALIGNANCY.   EGD and colonoscopy in 2016 by Dr. Allen Norris  DIAGNOSIS:  A. COLON POLYP, ASCENDING; HOT SNARE:  - TUBULAR ADENOMA, 1.0 CM FRAGMENT AND SEVERAL SMALLER FRAGMENTS.  - NEGATIVE FOR HIGH-GRADE DYSPLASIA AND MALIGNANCY.  EGD and colonoscopy in 2018 by Dr. Allen Norris - Small hiatal hernia. - Non-bleeding gastric ulcers with no stigmata of bleeding. Biopsied. - Gastritis. Biopsied. - Normal examined duodenum.  - Two 2 to 4 mm polyps in the sigmoid colon, removed with a cold snare. Resected and retrieved. - One 4 mm polyp in the ascending colon, removed with a cold snare. Resected and retrieved. - Diverticulosis in the sigmoid colon. - Non-bleeding internal hemorrhoids.  DIAGNOSIS:  A. STOMACH ULCERATION; COLD BIOPSY:  - MILD ACTIVE GASTRITIS WITH ULCERATION.  - NEGATIVE FOR DYSPLASIA AND MALIGNANCY.   ADDENDUM:  Immunohistochemical stain for H. pylori is negative with an appropriate  control.   B. COLON POLYP, ASCENDING; COLD SNARE:  - TUBULAR ADENOMA.  - NEGATIVE FOR HIGH-GRADE DYSPLASIA AND MALIGNANCY.   C. COLON POLYP 2, SIGMOID; COLD SNARE:  - TUBULAR ADENOMAS (2).  - NEGATIVE FOR HIGH-GRADE DYSPLASIA AND MALIGNANCY.   EGD and colonoscopy in 2016 by Dr. Allen Norris  DIAGNOSIS:  A. COLON POLYP, ASCENDING; HOT SNARE:  - TUBULAR ADENOMA, 1.0 CM FRAGMENT AND SEVERAL SMALLER FRAGMENTS.  - NEGATIVE FOR HIGH-GRADE  DYSPLASIA AND MALIGNANCY.   Past Medical History:  Diagnosis Date  . Allergy   . Anemia   . Arthritis   . Asthma   . Bacterial vaginitis   . Blood in stool   . Brachial neuritis or radiculitis NOS   . 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 being followed by neurosurgery/eye doctor and pcp. monitoring size.causes sinus problems  . Bronchitis   . Cardiac arrhythmia   . Cervical neck pain with evidence of disc disease    C5/6 disease, MRI done late 2012 - no records available  . Chronic combined systolic and diastolic CHF (congestive heart failure) ([place])    a. 08/2010 Echo: mildly reduced EF 40-45%, mild diffuse hypokinesis; b. 06/2016 Echo: EF 50%, no rwma, mild to mod TR; c. 03/2017 Echo: EF 40-45%, Gr1 DD (prior echo reviewed and EF felt to be lower than reported).  . Chronic pain   . Cocaine abuse, in remission ([place])    clean x 24 years  . Colon polyps   . COPD (chronic obstructive pulmonary disease) ([place])    a. 12/2018 PFT: No obvious obst/restrictive dzs.  . Coronary artery disease    a. PCI of LCX 2003; b. PCI of the LAD 2012 with  a (2.5 x 8 mm BMS);  c.s/p CABG 4/12: L-LAD, VG-Dx, VG-OM, VG-RCA (Dr. Prescott Gum);  d. 01/2012 MV: inf infarct, attenuation, no ischemia; e. 02/2017 MV: signifi attenuation artifact. Fixed basal antlat/inflat scar vs artifact. Reversible apical lat and mid antlat defect - ? atten vs ischemia. F/u echo w/o wma->Med rx.  . Depression   . Diabetes mellitus without complication ([place])   . Family history of colon cancer   . Generalized headaches    frequent  . GERD (gastroesophageal reflux disease)   . Headache   . Heart murmur   . Hematochezia   . Hepatitis    history of hepatitis b  . History of cervical cancer    s/p cryotherapy  . History of drug abuse ([place])    cocaine, marijuana, clean since 1989  . History of hepatitis B    from eating undercooked liver  . History of MI (myocardial infarction)   . Hyperlipidemia   . Hypertension    . Ischemic cardiomyopathy    a. 03/2017 Echo: EF 40-45%, Gr1 DD.  [name] Myocardial infarction [facility]) 2003, 2012  . PAD (peripheral artery disease) ([place])    a. s/p Right SFA atherectomy and PTA 01/15/11; b. 07/2018 ABI: R 1.02, L 1.09.  [name] Pituitary mass ([place])    a. 12/2018 MRI Brain: Stable pituitary mass w/ 29mm area of necrosis. Mass abuts R cavernous sinus w/o definite invasion.  . Polyp of colon   . Seasonal allergies   . Sleep apnea    uses cpap  . Smoking history    quit 07/2010  . Thyroid disease   . Urinary incontinence     Past Surgical History:  Procedure Laterality Date  . ABDOMINAL HYSTERECTOMY     2003  . CHOLECYSTECTOMY  1986  . COLONOSCOPY  2008   3 polyps  . COLONOSCOPY WITH PROPOFOL N/A 02/06/2015   Procedure: COLONOSCOPY WITH PROPOFOL;  Surgeon: Lucilla Lame, MD;  Location: [facility] ENDOSCOPY;  Service: Endoscopy;  Laterality: N/A;  . COLONOSCOPY WITH PROPOFOL N/A 01/27/2017   Procedure: COLONOSCOPY WITH PROPOFOL;  Surgeon: Lucilla Lame, MD;  Location: [facility] ENDOSCOPY;  Service: Endoscopy;  Laterality: N/A;  . CORONARY ANGIOPLASTY     w/ stent placement x2  . CORONARY ARTERY BYPASS GRAFT  2012   Dr [name]  . CORONARY STENT PLACEMENT  2003   S/P MI  . CORONARY STENT PLACEMENT  2007   [place]  . ESOPHAGOGASTRODUODENOSCOPY (EGD) WITH PROPOFOL N/A 02/06/2015   Procedure: ESOPHAGOGASTRODUODENOSCOPY (EGD) WITH PROPOFOL;  Surgeon: Lucilla Lame, MD;  Location: [facility] ENDOSCOPY;  Service: Endoscopy;  Laterality: N/A;  . ESOPHAGOGASTRODUODENOSCOPY (EGD) WITH PROPOFOL N/A 01/27/2017   Procedure: ESOPHAGOGASTRODUODENOSCOPY (EGD) WITH PROPOFOL;  Surgeon: Lucilla Lame, MD;  Location: [facility] ENDOSCOPY;  Service: Endoscopy;  Laterality: N/A;  . ESOPHAGOGASTRODUODENOSCOPY (EGD) WITH PROPOFOL N/A 09/01/2019   Procedure: ESOPHAGOGASTRODUODENOSCOPY (EGD) WITH PROPOFOL;  Surgeon: Lin Landsman, MD;  Location: [facility] ENDOSCOPY;  Service: Gastroenterology;  Laterality: N/A;  . FEMORAL ARTERY STENT  10/2010    right sided (Dr. Burt Knack)  . KNEE ARTHROSCOPY WITH MEDIAL MENISECTOMY Right 08/20/2017   Procedure: KNEE ARTHROSCOPY WITH MEDIAL  AND LATERAL MENISECTOMY;  Surgeon: Hessie Knows, MD;  Location: [facility] ORS;  Service: Orthopedics;  Laterality: Right;  . POSTERIOR CERVICAL LAMINECTOMY N/A 03/08/2018   Procedure: POSTERIOR CERVICAL LAMINECTOMY-C7;  Surgeon: Deetta Perla, MD;  Location: [facility] ORS;  Service: Neurosurgery;  Laterality: N/A;  . TONSILLECTOMY    . TOTAL KNEE ARTHROPLASTY Right 04/14/2019   Procedure: RIGHT  TOTAL KNEE ARTHROPLASTY;  Surgeon: Hessie Knows, MD;  Location: [facility] ORS;  Service: Orthopedics;  Laterality: Right;  . TUBAL LIGATION      Current Outpatient Medications:  .  aspirin EC 81 MG tablet, Take 1 tablet (81 mg total) by mouth daily., Disp: , Rfl:  .  Azelastine HCl 0.15 % SOLN, Place 2 sprays into both nostrils daily as needed (allergies). , Disp: , Rfl:  .  cyclobenzaprine (FLEXERIL) 5 MG tablet, Take 1-2 tablets (5-10 mg total) by mouth at bedtime as needed for muscle spasms., Disp: 60 tablet, Rfl: 11 .  dicyclomine (BENTYL) 10 MG capsule, Take 1 capsule (10 mg total) by mouth 3 (three) times daily before meals. As needed for abdominal pain, Disp: 120 capsule, Rfl: 11 .  esomeprazole (NEXIUM) 40 MG capsule, Take 1 capsule (40 mg total) by mouth daily. Reported on 09/04/2015, Disp: 90 capsule, Rfl: 3 .  Evolocumab (REPATHA SURECLICK) 932 MG/ML SOAJ, Inject 1 pen into the skin every 14 (fourteen) days., Disp: 2 pen, Rfl: 10 .  ezetimibe (ZETIA) 10 MG tablet, Take 1 tablet by mouth once daily, Disp: 90 tablet, Rfl: 3 .  fluticasone (FLONASE) 50 MCG/ACT nasal spray, Place 2 sprays into both nostrils daily as needed for allergies. , Disp: , Rfl:  .  Fluticasone-Umeclidin-Vilant (TRELEGY ELLIPTA) 100-62.5-25 MCG/INH AEPB, Inhale 1 puff into the lungs daily., Disp: 28 each, Rfl: 5 .  furosemide (LASIX) 20 MG tablet, TAKE 1 TABLET BY MOUTH ONCE DAILY AND A SECOND TABLET IF NEEDED FOR  FLUID BUILD UP, Disp: 180 tablet, Rfl: 2 .  glucose blood (ONE TOUCH ULTRA TEST) test strip, Use as instructed check cbg qd E11.9, Disp: 100 each, Rfl: 12 .  hydrOXYzine (ATARAX/VISTARIL) 25 MG tablet, Take 1-2 tablets (25-50 mg total) by mouth daily as needed., Disp: 60 tablet, Rfl: 2 .  ipratropium-albuterol (DUONEB) 0.5-2.5 (3) MG/3ML SOLN, Take 3 mLs by nebulization 2 (two) times daily as needed (wheezing/shortness of breath)., Disp: 360 mL, Rfl: 11 .  isosorbide mononitrate (IMDUR) 30 MG 24 hr tablet, Take 1 tablet by mouth twice daily, Disp: 180 tablet, Rfl: 0 .  Lancets MISC, 1 Device by Does not apply route daily. Lancets E11.9, Disp: 90 each, Rfl: 3 .  levocetirizine (XYZAL) 5 MG tablet, Take 1 tablet (5 mg total) by mouth at bedtime as needed for allergies., Disp: 90 tablet, Rfl: 3 .  linaclotide (LINZESS) 72 MCG capsule, Take 1 capsule (72 mcg total) by mouth daily before breakfast., Disp: 90 capsule, Rfl: 3 .  losartan (COZAAR) 25 MG tablet, Take 1 tablet (25 mg total) by mouth daily. (Patient taking differently: Take 25 mg by mouth every evening. ), Disp: 90 tablet, Rfl: 3 .  meclizine (ANTIVERT) 25 MG tablet, Take 25 mg by mouth 3 (three) times daily. , Disp: , Rfl: 3 .  methocarbamol (ROBAXIN) 500 MG tablet, Take 1 tablet (500 mg total) by mouth every 6 (six) hours as needed for muscle spasms., Disp: 30 tablet, Rfl: 0 .  mirabegron ER (MYRBETRIQ) 25 MG TB24 tablet, Take 1 tablet (25 mg total) by mouth daily., Disp: 30 tablet, Rfl: 11 .  montelukast (SINGULAIR) 10 MG tablet, Take 1 tablet (10 mg total) by mouth at bedtime., Disp: 30 tablet, Rfl: 5 .  nitroGLYCERIN (NITROSTAT) 0.4 MG SL tablet, Place 1 tablet (0.4 mg total) under the tongue every 5 (five) minutes as needed., Disp: 25 tablet, Rfl: 6 .  nystatin (MYCOSTATIN) 100000 UNIT/ML suspension, Use as directed 5 mLs (  500,000 Units total) in the mouth or throat 4 (four) times daily., Disp: 473 mL, Rfl: 0 .  ondansetron (ZOFRAN) 4 MG  tablet, Take 1 tablet (4 mg total) by mouth every 8 (eight) hours as needed for nausea or vomiting., Disp: 20 tablet, Rfl: 0 .  PARoxetine (PAXIL) 20 MG tablet, Take 1 tablet (20 mg total) by mouth daily., Disp: 90 tablet, Rfl: 3 .  potassium chloride (KLOR-CON) 10 MEQ tablet, Take 1 tablet (10 mEq total) by mouth daily., Disp: 90 tablet, Rfl: 3 .  rosuvastatin (CRESTOR) 20 MG tablet, Take 1 tablet (20 mg total) by mouth at bedtime., Disp: 90 tablet, Rfl: 3 .  XARELTO 2.5 MG TABS tablet, Take 1 tablet by mouth twice daily, Disp: 180 tablet, Rfl: 1 .  albuterol (VENTOLIN HFA) 108 (90 Base) MCG/ACT inhaler, Inhale 2 puffs into the lungs every 6 (six) hours as needed for wheezing or shortness of breath. (Patient not taking: Reported on 04/17/2020), Disp: 1 g, Rfl: 12 .  metoprolol succinate (TOPROL-XL) 50 MG 24 hr tablet, Take 1 tablet (50 mg total) by mouth daily. Take with or immediately following a meal. (Patient not taking: Reported on 04/17/2020), Disp: 90 tablet, Rfl: 3    Family History  Problem Relation Age of Onset  . Hypertension Father   . Heart failure Father   . Diabetes Father   . Colon cancer Father 74  . Glaucoma Father   . Cancer Father        colorectal   . Heart disease Father   . Other Father        glaucoma  . Breast cancer Mother 61       breast cancer, late 80's  . Cancer Mother        breast  . Brain cancer Sister   . Arthritis Sister   . Diabetes Sister   . Hypertension Sister   . Kidney disease Sister   . Diabetes Brother   . Hypertension Brother   . Other Sister        brain tumor   . Acute myelogenous leukemia Grandson        06/2019  . Coronary artery disease Neg Hx   . Stroke Neg Hx      Social History   Tobacco Use  . Smoking status: Former Smoker    Packs/day: 1.00    Years: 38.00    Pack years: 38.00    Types: Cigarettes    Quit date: 08/12/2010    Years since quitting: 9.6  . Smokeless tobacco: Never Used  Vaping Use  . Vaping Use:  Never used  Substance Use Topics  . Alcohol use: No  . Drug use: No    Types: Cocaine    Comment: Remote Hx (crack cocaine and marijuana).none since 37yrs plus    Allergies as of 04/17/2020 - Review Complete 04/17/2020  Allergen Reaction Noted  . Latex Rash 08/23/2010  . Shellfish allergy Anaphylaxis   . Sulfonamide derivatives Anaphylaxis 08/23/2010  . Watermelon [citrullus vulgaris] Other (See Comments) 04/13/2017  . Soy allergy  03/10/2019  . Other Swelling 01/20/2012    Review of Systems:    All systems reviewed and negative except where noted in HPI.   Physical Exam:  BP 112/78 (BP Location: Left Arm, Patient Position: Sitting, Cuff Size: Normal)   Pulse 79   Temp 98.1 F (36.7 C) (Oral)   Ht 5\' 5"  (1.651 m)   Wt 209 lb 6 oz (95 kg)  BMI 34.84 kg/m  No LMP recorded. Patient has had a hysterectomy.  General:   Alert,  Well-developed, well-nourished, pleasant and cooperative in mild distress due to pain Head:  Normocephalic and atraumatic. Eyes:  Sclera clear, no icterus.   Conjunctiva pink. Ears:  Normal auditory acuity. Nose:  No deformity, discharge, or lesions. Mouth:  No deformity or lesions,oropharynx pink & moist. Neck:  Supple; no masses or thyromegaly. Lungs:  Respirations even and unlabored.  Clear throughout to auscultation.   No wheezes, crackles, or rhonchi. No acute distress. Heart:  Regular rate and rhythm; no murmurs, clicks, rubs, or gallops. Abdomen:  Normal bowel sounds. Soft, nontender and non-distended without masses, mild epigastric tenderness hepatosplenomegaly or hernias noted.  no rebound tenderness.   Rectal: Not performed Msk:  Symmetrical without gross deformities. Good, equal movement & strength bilaterally. Pulses:  Normal pulses noted. Extremities:  No clubbing or edema.  No cyanosis. Neurologic:  Alert and oriented x3;  grossly normal neurologically. Skin:  Intact without significant lesions or rashes. No jaundice. Psych:  Alert and  cooperative. Normal mood and affect.  Imaging Studies: Reviewed  Assessment and Plan:   [name] is a 59 y.o. female with metabolic syndrome, chronic constipation, history of gastric ulcers, H. pylori negative, chronic left-sided abdominal pain. EGD is unremarkable for peptic ulcer   Chronic constipation and left lower quadrant pain History of sigmoid diverticulosis Reiterated on importance of high-fiber diet, adequate intake of water and fiber supplements as needed Continue Linzess 72 MCG daily Discussed about fiber supplements different choices like Fiberchoice, Metamucil etc. Continue to eliminate carbonated beverages, cut back on regular intake of red meat  Epigastric pain, reflux: Recent stress could be the potential trigger Continue Nexium 40 mg daily before breakfast and increase to twice daily if needed Continue antireflux lifestyle  Tubular adenomas of colon Recommend surveillance colonoscopy in 01/2022  Follow-up as needed   Cephas Darby, MD

## 2020-04-19 ENCOUNTER — Other Ambulatory Visit: Payer: Self-pay

## 2020-04-19 ENCOUNTER — Ambulatory Visit: Payer: Medicare HMO | Admitting: Internal Medicine

## 2020-04-19 ENCOUNTER — Ambulatory Visit (INDEPENDENT_AMBULATORY_CARE_PROVIDER_SITE_OTHER): Payer: Medicare HMO | Admitting: Internal Medicine

## 2020-04-19 ENCOUNTER — Encounter: Payer: Self-pay | Admitting: Internal Medicine

## 2020-04-19 ENCOUNTER — Other Ambulatory Visit: Payer: Self-pay | Admitting: Internal Medicine

## 2020-04-19 ENCOUNTER — Other Ambulatory Visit: Payer: Self-pay | Admitting: Pulmonary Disease

## 2020-04-19 VITALS — BP 124/76 | HR 74 | Temp 98.5°F | Ht 65.0 in | Wt 208.4 lb

## 2020-04-19 DIAGNOSIS — M25552 Pain in left hip: Secondary | ICD-10-CM

## 2020-04-19 DIAGNOSIS — I10 Essential (primary) hypertension: Secondary | ICD-10-CM

## 2020-04-19 DIAGNOSIS — N6019 Diffuse cystic mastopathy of unspecified breast: Secondary | ICD-10-CM

## 2020-04-19 DIAGNOSIS — M25551 Pain in right hip: Secondary | ICD-10-CM | POA: Insufficient documentation

## 2020-04-19 DIAGNOSIS — M545 Low back pain, unspecified: Secondary | ICD-10-CM

## 2020-04-19 DIAGNOSIS — G4733 Obstructive sleep apnea (adult) (pediatric): Secondary | ICD-10-CM

## 2020-04-19 DIAGNOSIS — G4700 Insomnia, unspecified: Secondary | ICD-10-CM

## 2020-04-19 DIAGNOSIS — H6123 Impacted cerumen, bilateral: Secondary | ICD-10-CM

## 2020-04-19 DIAGNOSIS — R5383 Other fatigue: Secondary | ICD-10-CM

## 2020-04-19 DIAGNOSIS — M869 Osteomyelitis, unspecified: Secondary | ICD-10-CM | POA: Diagnosis not present

## 2020-04-19 DIAGNOSIS — Z23 Encounter for immunization: Secondary | ICD-10-CM | POA: Diagnosis not present

## 2020-04-19 DIAGNOSIS — R928 Other abnormal and inconclusive findings on diagnostic imaging of breast: Secondary | ICD-10-CM

## 2020-04-19 HISTORY — DX: Insomnia, unspecified: G47.00

## 2020-04-19 HISTORY — DX: Pain in left hip: M25.551

## 2020-04-19 MED ORDER — LOSARTAN POTASSIUM 25 MG PO TABS: 25.0000 mg | ORAL_TABLET | Freq: Every day | ORAL | 3 refills | Status: DC

## 2020-04-19 MED ORDER — TRAZODONE HCL 50 MG PO TABS: 25.0000 mg | ORAL_TABLET | Freq: Every evening | ORAL | 3 refills | Status: DC | PRN

## 2020-04-19 NOTE — Progress Notes (Signed)
Chief Complaint  Patient presents with  . Follow-up  . Immunizations  . Sleeping Problem   F/u  1. Feeling tired and sluggish low energy and having insomnia sleeping 2-2.5 hrs and night and unable to fall asleep until 4:30/5 am and wakes up at 7 am x months Of note not used CPAP needs new mask  Tried otc meds for sleep and warm milk w/o help  2. 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 B/l ear cerumen impaction wants ears cleaned today   Review of Systems  Constitutional: Positive for malaise/fatigue.  HENT: Negative for hearing loss.        Ear wax +  Respiratory: Negative for shortness of breath.   Cardiovascular: Negative for chest pain.  Gastrointestinal: Negative for abdominal pain.  Skin: Negative for rash.  Neurological: Negative for headaches.  Psychiatric/Behavioral: The patient is nervous/anxious and has insomnia.    Past Medical History:  Diagnosis Date  . Allergy   . Anemia   . Arthritis   . Asthma   . Bacterial vaginitis   . Blood in stool   . Brachial neuritis or radiculitis NOS   . 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 being followed by neurosurgery/eye doctor and pcp. monitoring size.causes sinus problems  . Bronchitis   . Cardiac arrhythmia   . Cervical neck pain with evidence of disc disease    C5/6 disease, MRI done late 2012 - no records available  . Chronic combined systolic and diastolic CHF (congestive heart failure) ([place])    a. 08/2010 Echo: mildly reduced EF 40-45%, mild diffuse hypokinesis; b. 06/2016 Echo: EF 50%, no rwma, mild to mod TR; c. 03/2017 Echo: EF 40-45%, Gr1 DD (prior echo reviewed and EF felt to be lower than reported).  . Chronic pain   . Cocaine abuse, in remission ([place])    clean x 24 years  . Colon polyps   . COPD (chronic obstructive pulmonary disease) ([place])    a. 12/2018 PFT: No obvious obst/restrictive dzs.  . Coronary artery disease    a. PCI of LCX 2003; b. PCI of the  LAD 2012 with a (2.5 x 8 mm BMS);  c.s/p CABG 4/12: L-LAD, VG-Dx, VG-OM, VG-RCA (Dr. Prescott Gum);  d. 01/2012 MV: inf infarct, attenuation, no ischemia; e. 02/2017 MV: signifi attenuation artifact. Fixed basal antlat/inflat scar vs artifact. Reversible apical lat and mid antlat defect - ? atten vs ischemia. F/u echo w/o wma->Med rx.  . Depression   . Diabetes mellitus without complication ([place])   . Family history of colon cancer   . Generalized headaches    frequent  . GERD (gastroesophageal reflux disease)   . Headache   . Heart murmur   . Hematochezia   . Hepatitis    history of hepatitis b  . History of cervical cancer    s/p cryotherapy  . History of drug abuse ([place])    cocaine, marijuana, clean since 1989  . History of hepatitis B    from eating undercooked liver  . History of MI (myocardial infarction)   . Hyperlipidemia   . Hypertension   . Ischemic cardiomyopathy    a. 03/2017 Echo: EF 40-45%, Gr1 DD.  [name] Myocardial infarction [facility]) 2003, 2012  . PAD (peripheral artery disease) ([place])    a. s/p Right SFA atherectomy and PTA 01/15/11; b. 07/2018 ABI: R 1.02, L 1.09.  [name] Pituitary mass ([place])    a.  12/2018 MRI Brain: Stable pituitary mass w/ 105mm area of necrosis. Mass abuts R cavernous sinus w/o definite invasion.  . Polyp of colon   . Seasonal allergies   . Sleep apnea    uses cpap  . Smoking history    quit 07/2010  . Thyroid disease   . Urinary incontinence    Past Surgical History:  Procedure Laterality Date  . ABDOMINAL HYSTERECTOMY     2003  . CHOLECYSTECTOMY  1986  . COLONOSCOPY  2008   3 polyps  . COLONOSCOPY WITH PROPOFOL N/A 02/06/2015   Procedure: COLONOSCOPY WITH PROPOFOL;  Surgeon: Lucilla Lame, MD;  Location: [facility] ENDOSCOPY;  Service: Endoscopy;  Laterality: N/A;  . COLONOSCOPY WITH PROPOFOL N/A 01/27/2017   Procedure: COLONOSCOPY WITH PROPOFOL;  Surgeon: Lucilla Lame, MD;  Location: [facility] ENDOSCOPY;  Service: Endoscopy;  Laterality: N/A;  . CORONARY ANGIOPLASTY      w/ stent placement x2  . CORONARY ARTERY BYPASS GRAFT  2012   Dr [name]  . CORONARY STENT PLACEMENT  2003   S/P MI  . CORONARY STENT PLACEMENT  2007   [place]  . ESOPHAGOGASTRODUODENOSCOPY (EGD) WITH PROPOFOL N/A 02/06/2015   Procedure: ESOPHAGOGASTRODUODENOSCOPY (EGD) WITH PROPOFOL;  Surgeon: Lucilla Lame, MD;  Location: [facility] ENDOSCOPY;  Service: Endoscopy;  Laterality: N/A;  . ESOPHAGOGASTRODUODENOSCOPY (EGD) WITH PROPOFOL N/A 01/27/2017   Procedure: ESOPHAGOGASTRODUODENOSCOPY (EGD) WITH PROPOFOL;  Surgeon: Lucilla Lame, MD;  Location: [facility] ENDOSCOPY;  Service: Endoscopy;  Laterality: N/A;  . ESOPHAGOGASTRODUODENOSCOPY (EGD) WITH PROPOFOL N/A 09/01/2019   Procedure: ESOPHAGOGASTRODUODENOSCOPY (EGD) WITH PROPOFOL;  Surgeon: Lin Landsman, MD;  Location: [facility] ENDOSCOPY;  Service: Gastroenterology;  Laterality: N/A;  . FEMORAL ARTERY STENT  10/2010   right sided (Dr. Burt Knack)  . KNEE ARTHROSCOPY WITH MEDIAL MENISECTOMY Right 08/20/2017   Procedure: KNEE ARTHROSCOPY WITH MEDIAL  AND LATERAL MENISECTOMY;  Surgeon: Hessie Knows, MD;  Location: [facility] ORS;  Service: Orthopedics;  Laterality: Right;  . POSTERIOR CERVICAL LAMINECTOMY N/A 03/08/2018   Procedure: POSTERIOR CERVICAL LAMINECTOMY-C7;  Surgeon: Deetta Perla, MD;  Location: [facility] ORS;  Service: Neurosurgery;  Laterality: N/A;  . TONSILLECTOMY    . TOTAL KNEE ARTHROPLASTY Right 04/14/2019   Procedure: RIGHT TOTAL KNEE ARTHROPLASTY;  Surgeon: Hessie Knows, MD;  Location: [facility] ORS;  Service: Orthopedics;  Laterality: Right;  . TUBAL LIGATION     Family History  Problem Relation Age of Onset  . Hypertension Father   . Heart failure Father   . Diabetes Father   . Colon cancer Father 75  . Glaucoma Father   . Cancer Father        colorectal   . Heart disease Father   . Other Father        glaucoma  . Breast cancer Mother 45       breast cancer, late 81's  . Cancer Mother        breast  . Brain cancer Sister   . Arthritis Sister   .  Diabetes Sister   . Hypertension Sister   . Kidney disease Sister   . Diabetes Brother   . Hypertension Brother   . Other Sister        brain tumor   . Acute myelogenous leukemia Grandson        06/2019  . Coronary artery disease Neg Hx   . Stroke Neg Hx    Social History   Socioeconomic History  . Marital status: Legally Separated    Spouse name: Not on file  . Number  of children: Not on file  . Years of education: Not on file  . Highest education level: Not on file  Occupational History  . Not on file  Tobacco Use  . Smoking status: Former Smoker    Packs/day: 1.00    Years: 38.00    Pack years: 38.00    Types: Cigarettes    Quit date: 08/12/2010    Years since quitting: 9.6  . Smokeless tobacco: Never Used  Vaping Use  . Vaping Use: Never used  Substance and Sexual Activity  . Alcohol use: No  . Drug use: No    Types: Cocaine    Comment: Remote Hx (crack cocaine and marijuana).none since 54yrs plus  . Sexual activity: Yes  Other Topics Concern  . 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 Difficulty of Paying Living Expenses: Not hard at all  Food Insecurity: No Food Insecurity  . Worried About Charity fundraiser in the Last Year: Never true  . Ran Out of Food in the Last Year: Never true  Transportation Needs: No Transportation Needs  . Lack of Transportation (Medical): No  . Lack of Transportation (Non-Medical): No  Physical Activity:   . Days of Exercise per Week: Not on file  . Minutes of Exercise per Session: Not on file  Stress: No Stress  Concern Present  . Feeling of Stress : Not at all  Social Connections: Unknown  . Frequency of Communication with Friends and Family: More than three times a week  . Frequency of Social Gatherings with Friends and Family: Once a week  . Attends Religious Services: 1 to 4 times per year  . Active Member of Clubs or Organizations: Yes  . Attends Archivist Meetings: More than 4 times per year  . Marital Status: Not on file  Intimate Partner Violence: Not At Risk  . Fear of Current or Ex-Partner: No  . Emotionally Abused: No  . Physically Abused: No  . Sexually Abused: No   Current Meds  Medication Sig  . albuterol (VENTOLIN HFA) 108 (90 Base) MCG/ACT inhaler Inhale 2 puffs into the lungs every 6 (six) hours as needed for wheezing or shortness of breath.  [name] aspirin EC 81 MG tablet Take 1 tablet (81 mg total) by mouth daily.  . Azelastine HCl 0.15 % SOLN Place 2 sprays into both nostrils daily as needed (allergies).   . cyclobenzaprine (FLEXERIL) 5 MG tablet Take 1-2 tablets (5-10 mg total) by mouth at bedtime as needed for muscle spasms.  [name] dicyclomine (BENTYL) 10 MG capsule Take 1 capsule (10 mg total) by mouth 3 (three) times daily before meals. As needed for abdominal pain  . esomeprazole (NEXIUM) 40 MG capsule Take 1 capsule (40 mg total) by mouth daily. Reported on 09/04/2015  . Evolocumab (REPATHA SURECLICK) 182 MG/ML SOAJ Inject 1 pen into the skin every 14 (fourteen) days.  [name] ezetimibe (ZETIA) 10 MG tablet Take 1 tablet by mouth once  daily  . fluticasone (FLONASE) 50 MCG/ACT nasal spray Place 2 sprays into both nostrils daily as needed for allergies.   . Fluticasone-Umeclidin-Vilant (TRELEGY ELLIPTA) 100-62.5-25 MCG/INH AEPB Inhale 1 puff into the lungs daily.  . furosemide (LASIX) 20 MG tablet TAKE 1 TABLET BY MOUTH ONCE DAILY AND A SECOND TABLET IF NEEDED FOR FLUID BUILD UP  . glucose blood (ONE TOUCH ULTRA TEST) test strip Use as instructed check cbg qd E11.9  .  hydrOXYzine (ATARAX/VISTARIL) 25 MG tablet Take 1-2 tablets (25-50 mg total) by mouth daily as needed.  [name] ipratropium-albuterol (DUONEB) 0.5-2.5 (3) MG/3ML SOLN Take 3 mLs by nebulization 2 (two) times daily as needed (wheezing/shortness of breath).  . isosorbide mononitrate (IMDUR) 30 MG 24 hr tablet Take 1 tablet by mouth twice daily  . Lancets MISC 1 Device by Does not apply route daily. Lancets E11.9  . levocetirizine (XYZAL) 5 MG tablet Take 1 tablet (5 mg total) by mouth at bedtime as needed for allergies.  [name] losartan (COZAAR) 25 MG tablet Take 1 tablet (25 mg total) by mouth daily.  . meclizine (ANTIVERT) 25 MG tablet Take 25 mg by mouth 3 (three) times daily.   . methocarbamol (ROBAXIN) 500 MG tablet Take 1 tablet (500 mg total) by mouth every 6 (six) hours as needed for muscle spasms.  . mirabegron ER (MYRBETRIQ) 25 MG TB24 tablet Take 1 tablet (25 mg total) by mouth daily.  . montelukast (SINGULAIR) 10 MG tablet Take 1 tablet (10 mg total) by mouth at bedtime.  . nitroGLYCERIN (NITROSTAT) 0.4 MG SL tablet Place 1 tablet (0.4 mg total) under the tongue every 5 (five) minutes as needed.  . nystatin (MYCOSTATIN) 100000 UNIT/ML suspension Use as directed 5 mLs (500,000 Units total) in the mouth or throat 4 (four) times daily.  . ondansetron (ZOFRAN) 4 MG tablet Take 1 tablet (4 mg total) by mouth every 8 (eight) hours as needed for nausea or vomiting.  [name] PARoxetine (PAXIL) 20 MG tablet Take 1 tablet (20 mg total) by mouth daily.  . potassium chloride (KLOR-CON) 10 MEQ tablet Take 1 tablet (10 mEq total) by mouth daily.  . rosuvastatin (CRESTOR) 20 MG tablet Take 1 tablet (20 mg total) by mouth at bedtime.  Alveda Reasons 2.5 MG TABS tablet Take 1 tablet by mouth twice daily   Allergies  Allergen Reactions  . Latex Rash       . Shellfish Allergy Anaphylaxis    Hard shellfish/swelling in throat  . Sulfonamide Derivatives Anaphylaxis    Swelling in throat.  . Watermelon [Citrullus Vulgaris]  Other (See Comments)    Throat itchy  . Soy Allergy     Diarrhea/upset stomach    . Other Swelling   Recent Results (from the past 2160 hour(s))  Urinalysis, Routine w reflex microscopic     Status: None   Collection Time: 02/21/20  2:46 PM  Result Value Ref Range   Color, Urine YELLOW YELLOW   APPearance CLEAR CLEAR   Specific Gravity, Urine 1.012 1.001 - 1.03   pH 6.0 5.0 - 8.0   Glucose, UA NEGATIVE NEGATIVE   Bilirubin Urine NEGATIVE NEGATIVE   Ketones, ur NEGATIVE NEGATIVE   Hgb urine dipstick NEGATIVE NEGATIVE   Protein, ur NEGATIVE NEGATIVE   Nitrite NEGATIVE NEGATIVE   Leukocytes,Ua NEGATIVE NEGATIVE  Urine Culture     Status: None   Collection Time: 02/21/20  2:46 PM   Specimen: Urine  Result Value Ref Range   MICRO NUMBER: [id number]  SPECIMEN QUALITY: Adequate    Sample Source URINE    STATUS: FINAL    Result: No Growth    Objective  Body mass index is 34.68 kg/m. Wt Readings from Last 3 Encounters:  04/19/20 208 lb 6.4 oz (94.5 kg)  04/17/20 209 lb 6 oz (95 kg)  04/05/20 212 lb (96.2 kg)   Temp Readings from Last 3 Encounters:  04/19/20 98.5 F (36.9 C) (Oral)  04/17/20 98.1 F (36.7 C) (Oral)  04/05/20 (!) 96.9 F (36.1 C) (Temporal)   BP Readings from Last 3 Encounters:  04/19/20 124/76  04/17/20 112/78  04/05/20 108/68   Pulse Readings from Last 3 Encounters:  04/19/20 74  04/17/20 79  04/05/20 82    Physical Exam Vitals and nursing note reviewed.  Constitutional:      Appearance: Normal appearance. She is well-developed and well-groomed. She is obese.  HENT:     Head: Normocephalic and atraumatic.     Ears:     Comments: Excess wax b/l ears Eyes:     Conjunctiva/sclera: Conjunctivae normal.     Pupils: Pupils are equal, round, and reactive to light.  Cardiovascular:     Rate and Rhythm: Normal rate and regular rhythm.     Heart sounds: Normal heart sounds.  Pulmonary:     Effort: Pulmonary effort is normal.     Breath sounds:  Normal breath sounds.  Skin:    General: Skin is warm and dry.  Neurological:     General: No focal deficit present.     Mental Status: She is alert and oriented to person, place, and time. Mental status is at baseline.     Gait: Gait normal.  Psychiatric:        Attention and Perception: Attention and perception normal.        Mood and Affect: Mood and affect normal.        Speech: Speech normal.        Behavior: Behavior normal. Behavior is cooperative.        Thought Content: Thought content normal.        Cognition and Memory: Cognition and memory normal.        Judgment: Judgment normal.     Assessment  Plan  Insomnia, unspecified type - Plan: traZODone (DESYREL) 25-50 MG tablet  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HCC) Bilateral hip pain Chronic low back pain F/u ortho rec PT with ortho   Excessive cerumen in both ear canals Removed lavage and curette today tolerated all wax removed  Fatigue, unspecified type Messaged pulm needs CPAP machine   HM Flu shotutd given today  10/8/20utd Considershingrix in futureat pharmacy pna 23 had 03/10/16 covid 2/2 pfizer  Consider prevnar in future  S/p hysterectomy no cervix and f/u OB/GYNh/o abnormal pap s/p cryo  -pap 05/08/15 neg papwill Repeat In 5 years   Colonoscopy Dr. Allen Norris 01/2017 polyps, gastritis, diverticulosis referred today   Mammogram3/18/2020 negativere orderedsch 09/01/19 left dx mammogram As of 3 or 10/28/19 utd benign cyst likely right breast rec [place] in 6 months to f/u ordered and sch  DEXAneg 10/13/2018  Provider: Dr. Olivia Mackie [name]-[facility]

## 2020-04-19 NOTE — Patient Instructions (Addendum)
Multivitamin with iron  B complex vitamin with Vitamin B12 1000 MCG  Trazodone tablets What is this medicine? TRAZODONE (TRAZ oh done) is used to treat depression. This medicine may be used for other purposes; ask your health care provider or pharmacist if you have questions. COMMON BRAND NAME(S): Desyrel What should I tell my health care provider before I take this medicine? They need to know if you have any of these conditions:  attempted suicide or thinking about it  bipolar disorder  bleeding problems  glaucoma  heart disease, or previous heart attack  irregular heart beat  kidney or liver disease  low levels of sodium in the blood  an unusual or allergic reaction to trazodone, other medicines, foods, dyes or preservatives  pregnant or trying to get pregnant  breast-feeding How should I use this medicine? Take this medicine by mouth with a glass of water. Follow the directions on the prescription label. Take this medicine shortly after a meal or a light snack. Take your medicine at regular intervals. Do not take your medicine more often than directed. Do not stop taking this medicine suddenly except upon the advice of your doctor. Stopping this medicine too quickly may cause serious side effects or your condition may worsen. A special MedGuide will be given to you by the pharmacist with each prescription and refill. Be sure to read this information carefully each time. Talk to your pediatrician regarding the use of this medicine in children. Special care may be needed. Overdosage: If you think you have taken too much of this medicine contact a poison control center or emergency room at once. NOTE: This medicine is only for you. Do not share this medicine with others. What if I miss a dose? If you miss a dose, take it as soon as you can. If it is almost time for your next dose, take only that dose. Do not take double or extra doses. What may interact with this medicine? Do  not take this medicine with any of the following medications:  certain medicines for fungal infections like fluconazole, itraconazole, ketoconazole, posaconazole, voriconazole  cisapride  dronedarone  linezolid  MAOIs like Carbex, Eldepryl, Marplan, Nardil, and Parnate  mesoridazine  methylene blue (injected into a vein)  pimozide  saquinavir  thioridazine This medicine may also interact with the following medications:  alcohol  antiviral medicines for HIV or AIDS  aspirin and aspirin-like medicines  barbiturates like phenobarbital  certain medicines for blood pressure, heart disease, irregular heart beat  certain medicines for depression, anxiety, or psychotic disturbances  certain medicines for migraine headache like almotriptan, eletriptan, frovatriptan, naratriptan, rizatriptan, sumatriptan, zolmitriptan  certain medicines for seizures like carbamazepine and phenytoin  certain medicines for sleep  certain medicines that treat or prevent blood clots like dalteparin, enoxaparin, warfarin  digoxin  fentanyl  lithium  NSAIDS, medicines for pain and inflammation, like ibuprofen or naproxen  other medicines that prolong the QT interval (cause an abnormal heart rhythm) like dofetilide  rasagiline  supplements like St. John's wort, kava kava, valerian  tramadol  tryptophan This list may not describe all possible interactions. Give your health care provider a list of all the medicines, herbs, non-prescription drugs, or dietary supplements you use. Also tell them if you smoke, drink alcohol, or use illegal drugs. Some items may interact with your medicine. What should I watch for while using this medicine? Tell your doctor if your symptoms do not get better or if they get worse. Visit your doctor or  health care professional for regular checks on your progress. Because it may take several weeks to see the full effects of this medicine, it is important to  continue your treatment as prescribed by your doctor. Patients and their families should watch out for new or worsening thoughts of suicide or depression. Also watch out for sudden changes in feelings such as feeling anxious, agitated, panicky, irritable, hostile, aggressive, impulsive, severely restless, overly excited and hyperactive, or not being able to sleep. If this happens, especially at the beginning of treatment or after a change in dose, call your health care professional. Dennis Bast may get drowsy or dizzy. Do not drive, use machinery, or do anything that needs mental alertness until you know how this medicine affects you. Do not stand or sit up quickly, especially if you are an older patient. This reduces the risk of dizzy or fainting spells. Alcohol may interfere with the effect of this medicine. Avoid alcoholic drinks. This medicine may cause dry eyes and blurred vision. If you wear contact lenses you may feel some discomfort. Lubricating drops may help. See your eye doctor if the problem does not go away or is severe. Your mouth may get dry. Chewing sugarless gum, sucking hard candy and drinking plenty of water may help. Contact your doctor if the problem does not go away or is severe. What side effects may I notice from receiving this medicine? Side effects that you should report to your doctor or health care professional as soon as possible:  allergic reactions like skin rash, itching or hives, swelling of the face, lips, or tongue  elevated mood, decreased need for sleep, racing thoughts, impulsive behavior  confusion  fast, irregular heartbeat  feeling faint or lightheaded, falls  feeling agitated, angry, or irritable  loss of balance or coordination  painful or prolonged erections  restlessness, pacing, inability to keep still  suicidal thoughts or other mood changes  tremors  trouble sleeping  seizures  unusual bleeding or bruising Side effects that usually do not  require medical attention (report to your doctor or health care professional if they continue or are bothersome):  change in sex drive or performance  change in appetite or weight  constipation  headache  muscle aches or pains  nausea This list may not describe all possible side effects. Call your doctor for medical advice about side effects. You may report side effects to FDA at 1-800-FDA-1088. Where should I keep my medicine? Keep out of the reach of children. Store at room temperature between 15 and 30 degrees C (59 to 86 degrees F). Protect from light. Keep container tightly closed. Throw away any unused medicine after the expiration date. NOTE: This sheet is a summary. It may not cover all possible information. If you have questions about this medicine, talk to your doctor, pharmacist, or health care provider.  2020 Elsevier/Gold Standard (2018-07-06 11:46:46)

## 2020-04-20 ENCOUNTER — Other Ambulatory Visit: Payer: Self-pay | Admitting: Internal Medicine

## 2020-04-20 DIAGNOSIS — J309 Allergic rhinitis, unspecified: Secondary | ICD-10-CM

## 2020-04-20 MED ORDER — FLUTICASONE PROPIONATE 50 MCG/ACT NA SUSP: 2.0000 | Freq: Every day | NASAL | 11 refills | Status: DC | PRN

## 2020-04-25 ENCOUNTER — Other Ambulatory Visit: Payer: Self-pay | Admitting: Internal Medicine

## 2020-04-25 ENCOUNTER — Telehealth: Payer: Self-pay | Admitting: Internal Medicine

## 2020-04-25 DIAGNOSIS — R42 Dizziness and giddiness: Secondary | ICD-10-CM

## 2020-04-25 MED ORDER — MECLIZINE HCL 25 MG PO TABS: 25.0000 mg | ORAL_TABLET | Freq: Two times a day (BID) | ORAL | 5 refills | Status: DC | PRN

## 2020-04-25 NOTE — Telephone Encounter (Signed)
Patient is requesting a refill on her meclizine (ANTIVERT) 25 MG tablet, it is coming through her pharmacy as her other provider. She states she may need a new prescription written.

## 2020-05-04 ENCOUNTER — Telehealth: Payer: Self-pay | Admitting: Internal Medicine

## 2020-05-04 NOTE — Telephone Encounter (Signed)
Pt would like something called in for a yeast infection  Pt didn't want to make an appt

## 2020-05-04 NOTE — Telephone Encounter (Signed)
Please advise , consult fee?

## 2020-05-07 ENCOUNTER — Other Ambulatory Visit: Payer: Self-pay | Admitting: Internal Medicine

## 2020-05-07 ENCOUNTER — Other Ambulatory Visit: Payer: Medicare HMO

## 2020-05-07 DIAGNOSIS — B373 Candidiasis of vulva and vagina: Secondary | ICD-10-CM

## 2020-05-07 DIAGNOSIS — B3731 Acute candidiasis of vulva and vagina: Secondary | ICD-10-CM

## 2020-05-07 MED ORDER — FLUCONAZOLE 150 MG PO TABS: 150.0000 mg | ORAL_TABLET | Freq: Once | ORAL | 0 refills | Status: AC

## 2020-05-07 NOTE — Telephone Encounter (Signed)
Patient informed and verbalized understanding

## 2020-05-07 NOTE — Telephone Encounter (Signed)
Sent!

## 2020-05-09 DIAGNOSIS — R531 Weakness: Secondary | ICD-10-CM | POA: Diagnosis not present

## 2020-05-09 DIAGNOSIS — M869 Osteomyelitis, unspecified: Secondary | ICD-10-CM | POA: Diagnosis not present

## 2020-05-10 ENCOUNTER — Other Ambulatory Visit: Payer: Self-pay

## 2020-05-10 ENCOUNTER — Ambulatory Visit
Admission: RE | Admit: 2020-05-10 | Discharge: 2020-05-10 | Disposition: A | Discharge status: Home / Self Care | Payer: Medicare HMO | Source: Ambulatory Visit | Attending: Internal Medicine | Admitting: Internal Medicine

## 2020-05-10 DIAGNOSIS — N6313 Unspecified lump in the right breast, lower outer quadrant: Secondary | ICD-10-CM | POA: Diagnosis not present

## 2020-05-10 DIAGNOSIS — N6310 Unspecified lump in the right breast, unspecified quadrant: Secondary | ICD-10-CM | POA: Diagnosis not present

## 2020-05-14 ENCOUNTER — Telehealth: Payer: Self-pay | Admitting: *Deleted

## 2020-05-14 ENCOUNTER — Other Ambulatory Visit: Payer: Self-pay | Admitting: Internal Medicine

## 2020-05-14 DIAGNOSIS — N6320 Unspecified lump in the left breast, unspecified quadrant: Secondary | ICD-10-CM

## 2020-05-14 DIAGNOSIS — R928 Other abnormal and inconclusive findings on diagnostic imaging of breast: Secondary | ICD-10-CM | POA: Insufficient documentation

## 2020-05-14 DIAGNOSIS — N6310 Unspecified lump in the right breast, unspecified quadrant: Secondary | ICD-10-CM

## 2020-05-14 HISTORY — DX: Other abnormal and inconclusive findings on diagnostic imaging of breast: R92.8

## 2020-05-14 NOTE — Addendum Note (Signed)
Addended by: Orland Mustard on: 05/14/2020 05:11 PM   Modules accepted: Orders

## 2020-05-14 NOTE — Telephone Encounter (Signed)
Left message for patient to return call to office. 

## 2020-05-14 NOTE — Telephone Encounter (Signed)
-----   Message from Delorise Jackson, MD sent at 05/14/2020  8:27 AM EDT ----- Mammogram 09/01/19 with   minimal fat necrosis changes with small cysts within the UPPER LEFT breast extending from the 11 o'clock to 1 o'clock position.  Is this where she feels the mass b/c this diagnotic mammogram shows cysts ?   She can have b/l diagnostic mammogram at next breast f/u in 11/09/19 with [place]  Or if wanted I can refer her to a breast surgeon to examine?   Let me know

## 2020-05-15 ENCOUNTER — Other Ambulatory Visit: Payer: Self-pay

## 2020-05-15 DIAGNOSIS — G4710 Hypersomnia, unspecified: Secondary | ICD-10-CM | POA: Diagnosis not present

## 2020-05-15 DIAGNOSIS — G4733 Obstructive sleep apnea (adult) (pediatric): Secondary | ICD-10-CM | POA: Diagnosis not present

## 2020-05-15 DIAGNOSIS — D352 Benign neoplasm of pituitary gland: Secondary | ICD-10-CM | POA: Diagnosis not present

## 2020-05-15 DIAGNOSIS — I259 Chronic ischemic heart disease, unspecified: Secondary | ICD-10-CM | POA: Diagnosis not present

## 2020-05-17 ENCOUNTER — Other Ambulatory Visit: Payer: Self-pay

## 2020-05-17 MED ORDER — METFORMIN HCL ER 500 MG PO TB24: 500.0000 mg | ORAL_TABLET | Freq: Every day | ORAL | 1 refills | Status: DC

## 2020-05-21 ENCOUNTER — Other Ambulatory Visit: Payer: Self-pay | Admitting: Cardiovascular Disease

## 2020-05-22 DIAGNOSIS — G4733 Obstructive sleep apnea (adult) (pediatric): Secondary | ICD-10-CM | POA: Diagnosis not present

## 2020-05-22 DIAGNOSIS — Z9989 Dependence on other enabling machines and devices: Secondary | ICD-10-CM | POA: Diagnosis not present

## 2020-05-22 DIAGNOSIS — D352 Benign neoplasm of pituitary gland: Secondary | ICD-10-CM | POA: Diagnosis not present

## 2020-05-22 DIAGNOSIS — M542 Cervicalgia: Secondary | ICD-10-CM | POA: Diagnosis not present

## 2020-05-22 DIAGNOSIS — G629 Polyneuropathy, unspecified: Secondary | ICD-10-CM | POA: Diagnosis not present

## 2020-05-22 NOTE — Telephone Encounter (Signed)
Rx request sent to pharmacy.  

## 2020-05-24 DIAGNOSIS — E119 Type 2 diabetes mellitus without complications: Secondary | ICD-10-CM | POA: Diagnosis not present

## 2020-05-24 DIAGNOSIS — D352 Benign neoplasm of pituitary gland: Secondary | ICD-10-CM | POA: Diagnosis not present

## 2020-05-24 LAB — HM DIABETES EYE EXAM

## 2020-05-27 DIAGNOSIS — R69 Illness, unspecified: Secondary | ICD-10-CM | POA: Diagnosis not present

## 2020-05-28 DIAGNOSIS — R531 Weakness: Secondary | ICD-10-CM | POA: Diagnosis not present

## 2020-05-28 DIAGNOSIS — M869 Osteomyelitis, unspecified: Secondary | ICD-10-CM | POA: Diagnosis not present

## 2020-05-28 DIAGNOSIS — M25561 Pain in right knee: Secondary | ICD-10-CM | POA: Diagnosis not present

## 2020-05-29 ENCOUNTER — Encounter: Payer: Self-pay | Admitting: Internal Medicine

## 2020-06-15 DIAGNOSIS — M869 Osteomyelitis, unspecified: Secondary | ICD-10-CM | POA: Diagnosis not present

## 2020-06-15 DIAGNOSIS — R531 Weakness: Secondary | ICD-10-CM | POA: Diagnosis not present

## 2020-06-26 ENCOUNTER — Telehealth: Payer: Self-pay | Admitting: Internal Medicine

## 2020-06-26 NOTE — Telephone Encounter (Signed)
Faxed to [place] [facility] for diabetes a care consideration faxed on 06-26-2020

## 2020-06-27 DIAGNOSIS — G4733 Obstructive sleep apnea (adult) (pediatric): Secondary | ICD-10-CM | POA: Diagnosis not present

## 2020-06-27 DIAGNOSIS — I259 Chronic ischemic heart disease, unspecified: Secondary | ICD-10-CM | POA: Diagnosis not present

## 2020-06-27 DIAGNOSIS — G4710 Hypersomnia, unspecified: Secondary | ICD-10-CM | POA: Diagnosis not present

## 2020-06-28 ENCOUNTER — Other Ambulatory Visit: Payer: Self-pay | Admitting: Cardiovascular Disease

## 2020-07-02 DIAGNOSIS — Z7901 Long term (current) use of anticoagulants: Secondary | ICD-10-CM | POA: Diagnosis not present

## 2020-07-02 DIAGNOSIS — M7061 Trochanteric bursitis, right hip: Secondary | ICD-10-CM | POA: Diagnosis not present

## 2020-07-02 DIAGNOSIS — R928 Other abnormal and inconclusive findings on diagnostic imaging of breast: Secondary | ICD-10-CM | POA: Diagnosis not present

## 2020-07-02 DIAGNOSIS — M461 Sacroiliitis, not elsewhere classified: Secondary | ICD-10-CM | POA: Diagnosis not present

## 2020-07-05 DIAGNOSIS — G4733 Obstructive sleep apnea (adult) (pediatric): Secondary | ICD-10-CM | POA: Diagnosis not present

## 2020-07-09 ENCOUNTER — Other Ambulatory Visit: Payer: Self-pay | Admitting: Pulmonary Disease

## 2020-07-10 NOTE — Telephone Encounter (Signed)
Faxed to [facility] for A care Consideration for Heart failure consider increasing the ARB  Dose:#[id number] faxed on 07-10-20

## 2020-07-12 ENCOUNTER — Other Ambulatory Visit: Payer: Self-pay | Admitting: *Deleted

## 2020-07-12 DIAGNOSIS — N63 Unspecified lump in unspecified breast: Secondary | ICD-10-CM

## 2020-07-17 ENCOUNTER — Other Ambulatory Visit: Payer: Self-pay | Admitting: *Deleted

## 2020-07-17 ENCOUNTER — Other Ambulatory Visit: Payer: Self-pay | Admitting: General Surgery

## 2020-07-17 DIAGNOSIS — N63 Unspecified lump in unspecified breast: Secondary | ICD-10-CM

## 2020-07-18 ENCOUNTER — Telehealth: Payer: Self-pay

## 2020-07-18 DIAGNOSIS — F32A Depression, unspecified: Secondary | ICD-10-CM

## 2020-07-18 DIAGNOSIS — F419 Anxiety disorder, unspecified: Secondary | ICD-10-CM

## 2020-07-18 MED ORDER — PAROXETINE HCL 20 MG PO TABS: 20.0000 mg | ORAL_TABLET | Freq: Every day | ORAL | 3 refills | Status: DC

## 2020-07-18 NOTE — Telephone Encounter (Signed)
Pt needs Paroxetine sent [facility]. [facility] told pt they have faxed this to [place] several times but we haven't responded. Pt is completely out.

## 2020-07-24 ENCOUNTER — Ambulatory Visit (INDEPENDENT_AMBULATORY_CARE_PROVIDER_SITE_OTHER): Payer: Medicare HMO | Admitting: Internal Medicine

## 2020-07-24 ENCOUNTER — Other Ambulatory Visit: Payer: Self-pay

## 2020-07-24 ENCOUNTER — Encounter: Payer: Self-pay | Admitting: Internal Medicine

## 2020-07-24 VITALS — BP 100/58 | HR 89 | Temp 98.4°F | Ht 65.0 in | Wt 222.2 lb

## 2020-07-24 DIAGNOSIS — R252 Cramp and spasm: Secondary | ICD-10-CM | POA: Diagnosis not present

## 2020-07-24 DIAGNOSIS — B373 Candidiasis of vulva and vagina: Secondary | ICD-10-CM | POA: Diagnosis not present

## 2020-07-24 DIAGNOSIS — M25512 Pain in left shoulder: Secondary | ICD-10-CM

## 2020-07-24 DIAGNOSIS — J449 Chronic obstructive pulmonary disease, unspecified: Secondary | ICD-10-CM | POA: Diagnosis not present

## 2020-07-24 DIAGNOSIS — J324 Chronic pansinusitis: Secondary | ICD-10-CM | POA: Diagnosis not present

## 2020-07-24 DIAGNOSIS — Z1329 Encounter for screening for other suspected endocrine disorder: Secondary | ICD-10-CM

## 2020-07-24 DIAGNOSIS — E1159 Type 2 diabetes mellitus with other circulatory complications: Secondary | ICD-10-CM

## 2020-07-24 DIAGNOSIS — E538 Deficiency of other specified B group vitamins: Secondary | ICD-10-CM | POA: Diagnosis not present

## 2020-07-24 DIAGNOSIS — G8929 Other chronic pain: Secondary | ICD-10-CM

## 2020-07-24 DIAGNOSIS — E559 Vitamin D deficiency, unspecified: Secondary | ICD-10-CM

## 2020-07-24 DIAGNOSIS — B3731 Acute candidiasis of vulva and vagina: Secondary | ICD-10-CM

## 2020-07-24 DIAGNOSIS — I152 Hypertension secondary to endocrine disorders: Secondary | ICD-10-CM | POA: Diagnosis not present

## 2020-07-24 DIAGNOSIS — R69 Illness, unspecified: Secondary | ICD-10-CM | POA: Diagnosis not present

## 2020-07-24 LAB — LIPID PANEL
Cholesterol: 125 mg/dL (ref 0–200)
HDL: 56.3 mg/dL (ref >39.00)
LDL Cholesterol: 39 mg/dL (ref 0–99)
NonHDL: 68.52
Total CHOL/HDL Ratio: 2
Triglycerides: 150 mg/dL — ABNORMAL HIGH (ref 0.0–149.0)
VLDL: 30 mg/dL (ref 0.0–40.0)

## 2020-07-24 LAB — COMPREHENSIVE METABOLIC PANEL
ALT: 31 U/L (ref 0–35)
AST: 20 U/L (ref 0–37)
Albumin: 3.7 g/dL (ref 3.5–5.2)
Alkaline Phosphatase: 83 U/L (ref 39–117)
BUN: 18 mg/dL (ref 6–23)
CO2: 28 mEq/L (ref 19–32)
Calcium: 8.8 mg/dL (ref 8.4–10.5)
Chloride: 109 mEq/L (ref 96–112)
Creatinine, Ser: 1.21 mg/dL — ABNORMAL HIGH (ref 0.40–1.20)
GFR: 48.23 mL/min — ABNORMAL LOW (ref >60.00)
Glucose, Bld: 78 mg/dL (ref 70–99)
Potassium: 4 mEq/L (ref 3.5–5.1)
Sodium: 142 mEq/L (ref 135–145)
Total Bilirubin: 0.3 mg/dL (ref 0.2–1.2)
Total Protein: 6.1 g/dL (ref 6.0–8.3)

## 2020-07-24 LAB — CBC WITH DIFFERENTIAL/PLATELET
Basophils Absolute: 0 10*3/uL (ref 0.0–0.1)
Basophils Relative: 0.5 % (ref 0.0–3.0)
Eosinophils Absolute: 0.1 10*3/uL (ref 0.0–0.7)
Eosinophils Relative: 1.4 % (ref 0.0–5.0)
HCT: 38.6 % (ref 36.0–46.0)
Hemoglobin: 12.6 g/dL (ref 12.0–15.0)
Lymphocytes Relative: 39.5 % (ref 12.0–46.0)
Lymphs Abs: 2.4 10*3/uL (ref 0.7–4.0)
MCHC: 32.6 g/dL (ref 30.0–36.0)
MCV: 95.6 fl (ref 78.0–100.0)
Monocytes Absolute: 0.5 10*3/uL (ref 0.1–1.0)
Monocytes Relative: 8.2 % (ref 3.0–12.0)
Neutro Abs: 3.1 10*3/uL (ref 1.4–7.7)
Neutrophils Relative %: 50.4 % (ref 43.0–77.0)
Platelets: 134 10*3/uL — ABNORMAL LOW (ref 150.0–400.0)
RBC: 4.03 Mil/uL (ref 3.87–5.11)
RDW: 14 % (ref 11.5–15.5)
WBC: 6.1 10*3/uL (ref 4.0–10.5)

## 2020-07-24 LAB — VITAMIN B12: Vitamin B-12: 236 pg/mL (ref 211–911)

## 2020-07-24 LAB — TSH: TSH: 0.73 u[IU]/mL (ref 0.35–4.50)

## 2020-07-24 LAB — HEMOGLOBIN A1C: Hgb A1c MFr Bld: 6.5 % (ref 4.6–6.5)

## 2020-07-24 LAB — VITAMIN D 25 HYDROXY (VIT D DEFICIENCY, FRACTURES): VITD: 22.21 ng/mL — ABNORMAL LOW (ref 30.00–100.00)

## 2020-07-24 MED ORDER — FLUCONAZOLE 150 MG PO TABS: 150.0000 mg | ORAL_TABLET | Freq: Once | ORAL | 0 refills | Status: AC

## 2020-07-24 MED ORDER — ASPIRIN EC 81 MG PO TBEC: 81.0000 mg | DELAYED_RELEASE_TABLET | Freq: Every day | ORAL | 3 refills | Status: DC

## 2020-07-24 MED ORDER — MONTELUKAST SODIUM 10 MG PO TABS: 10.0000 mg | ORAL_TABLET | Freq: Every day | ORAL | 3 refills | Status: DC

## 2020-07-24 MED ORDER — METOPROLOL SUCCINATE ER 50 MG PO TB24: 50.0000 mg | ORAL_TABLET | Freq: Every day | ORAL | 3 refills | Status: DC

## 2020-07-24 MED ORDER — PREDNISONE 20 MG PO TABS: 20.0000 mg | ORAL_TABLET | Freq: Every day | ORAL | 0 refills | Status: DC

## 2020-07-24 MED ORDER — ALBUTEROL SULFATE HFA 108 (90 BASE) MCG/ACT IN AERS: 2.0000 | INHALATION_SPRAY | Freq: Four times a day (QID) | RESPIRATORY_TRACT | 12 refills | Status: DC | PRN

## 2020-07-24 MED ORDER — SALINE SPRAY 0.65 % NA SOLN: 2.0000 | Freq: Every day | NASAL | 11 refills | Status: DC | PRN

## 2020-07-24 MED ORDER — AZITHROMYCIN 250 MG PO TABS: ORAL_TABLET | ORAL | 0 refills | Status: DC

## 2020-07-24 NOTE — Patient Instructions (Addendum)
theraworx spray for leg cramps  voltaren gel 4x per day   [facility]  [place], [place] [id number]  [phone]  Lauris Poag, MD  [facility] [place], [place] [address]  [phone] (Work)  [phone] ([address])    [address], [place]  [place], [place] [address]  [phone] (Work)  [phone] (Fax)      Nasal saline and flonase xyzal  5mg  1 pill at night  singulair at night  Meclizine as needed for dizziness  Prednisone  Antibiotic    Gold bond talc free powder in the greases   Hold metformin    Consider ENT in the future for nose symptoms and allergies    Muscle Cramps and Spasms Muscle cramps and spasms occur when a muscle or muscles tighten and you have no control over this tightening (involuntary muscle contraction). They are a common problem and can develop in any muscle. The most common place is in the calf muscles of the leg. Muscle cramps and muscle spasms are both involuntary muscle contractions, but there are some differences between the two:  Muscle cramps are painful. They come and go and may last for a few seconds or up to 15 minutes. Muscle cramps are often more forceful and last longer than muscle spasms.  Muscle spasms may or may not be painful. They may also last just a few seconds or much longer. Certain medical conditions, such as diabetes or Parkinson's disease, can make it more likely to develop cramps or spasms. However, cramps or spasms are usually not caused by a serious underlying problem. Common causes include:  Doing more physical work or exercise than your body is ready for (overexertion).  Overuse from repeating certain movements too many times.  Remaining in a certain position for a long period of time.  Improper preparation, form, or technique while playing a sport or doing an activity.  Dehydration.  Injury.  Side  effects of some medicines.  Abnormally low levels of the salts and minerals in your blood (electrolytes), especially potassium and calcium. This could happen if you are taking water pills (diuretics) or if you are pregnant. In many cases, the cause of muscle cramps or spasms is not known. Follow these instructions at home: Managing pain and stiffness      Try massaging, stretching, and relaxing the affected muscle. Do this for several minutes at a time.  If directed, apply heat to tight or tense muscles as often as told by your health care provider. Use the heat source that your health care provider recommends, such as a moist heat pack or a heating pad. ? Place a towel between your skin and the heat source. ? Leave the heat on for 20-30 minutes. ? Remove the heat if your skin turns bright red. This is especially important if you are unable to feel pain, heat, or cold. You may have a greater risk of getting burned.  If directed, put ice on the affected area. This may help if you are sore or have pain after a cramp or spasm. ? Put ice in a plastic bag. ? Place a towel between your skin and the bag. ? Leavethe ice on for 20 minutes, 2-3 times a day.  Try taking hot showers or baths to help relax tight muscles. Eating and drinking  Drink enough fluid to keep your urine pale yellow. Staying well hydrated may help prevent cramps or  spasms.  Eat a healthy diet that includes plenty of nutrients to help your muscles function. A healthy diet includes fruits and vegetables, lean protein, whole grains, and low-fat or nonfat dairy products. General instructions  If you are having frequent cramps, avoid intense exercise for several days.  Take over-the-counter and prescription medicines only as told by your health care provider.  Pay attention to any changes in your symptoms.  Keep all follow-up visits as told by your health care provider. This is important. Contact a health care provider  if:  Your cramps or spasms get more severe or happen more often.  Your cramps or spasms do not improve over time. Summary  Muscle cramps and spasms occur when a muscle or muscles tighten and you have no control over this tightening (involuntary muscle contraction).  The most common place for cramps or spasms to occur is in the calf muscles of the leg.  Massaging, stretching, and relaxing the affected muscle may relieve the cramp or spasm.  Drink enough fluid to keep your urine pale yellow. Staying well hydrated may help prevent cramps or spasms. This information is not intended to replace advice given to you by your health care provider. Make sure you discuss any questions you have with your health care provider. Document Revised: 12/07/2017 Document Reviewed: 12/07/2017 Elsevier Patient Education  [place].    Shoulder Impingement Syndrome Rehab Ask your health care provider which exercises are safe for you. Do exercises exactly as told by your health care provider and adjust them as directed. It is normal to feel mild stretching, pulling, tightness, or discomfort as you do these exercises. Stop right away if you feel sudden pain or your pain gets worse. Do not begin these exercises until told by your health care provider. Stretching and range-of-motion exercise This exercise warms up your muscles and joints and improves the movement and flexibility of your shoulder. This exercise also helps to relieve pain and stiffness. Passive horizontal adduction In passive adduction, you use your other hand to move the injured arm toward your body. The injured arm does not move on its own. In this movement, your arm is moved across your body in the horizontal plane (horizontal adduction). Sit or stand and pull your left / right elbow across your chest, toward your other shoulder. Stop when you feel a gentle stretch in the back of your shoulder and upper arm. Keep your arm at shoulder  height. Keep your arm as close to your body as you comfortably can. Hold for __________ seconds. Slowly return to the starting position. Repeat __________ times. Complete this exercise __________ times a day. Strengthening exercises These exercises build strength and endurance in your shoulder. Endurance is the ability to use your muscles for a long time, even after they get tired. External rotation, isometric This is an exercise in which you press the back of your wrist against a door frame without moving your shoulder joint (isometric). Stand or sit in a doorway, facing the door frame. Bend your left / right elbow and place the back of your wrist against the door frame. Only the back of your wrist should be touching the frame. Keep your upper arm at your side. Gently press your wrist against the door frame, as if you are trying to push your arm away from your abdomen (external rotation). Press as hard as you are able without pain. Avoid shrugging your shoulder while you press your wrist against the door frame. Keep your shoulder  blade tucked down toward the middle of your back. Hold for __________ seconds. Slowly release the tension, and relax your muscles completely before you repeat the exercise. Repeat __________ times. Complete this exercise __________ times a day. Internal rotation, isometric This is an exercise in which you press your palm against a door frame without moving your shoulder joint (isometric). Stand or sit in a doorway, facing the door frame. Bend your left / right elbow and place the palm of your hand against the door frame. Only your palm should be touching the frame. Keep your upper arm at your side. Gently press your hand against the door frame, as if you are trying to push your arm toward your abdomen (internal rotation). Press as hard as you are able without pain. Avoid shrugging your shoulder while you press your hand against the door frame. Keep your shoulder blade  tucked down toward the middle of your back. Hold for __________ seconds. Slowly release the tension, and relax your muscles completely before you repeat the exercise. Repeat __________ times. Complete this exercise __________ times a day. Scapular protraction, supine  Lie on your back on a firm surface (supine position). Hold a __________ weight in your left / right hand. Raise your left / right arm straight into the air so your hand is directly above your shoulder joint. Push the weight into the air so your shoulder (scapula) lifts off the surface that you are lying on. The scapula will push up or forward (protraction). Do not move your head, neck, or back. Hold for __________ seconds. Slowly return to the starting position. Let your muscles relax completely before you repeat this exercise. Repeat __________ times. Complete this exercise __________ times a day. Scapular retraction  Sit in a stable chair without armrests, or stand up. Secure an exercise band to a stable object in front of you so the band is at shoulder height. Hold one end of the exercise band in each hand. Your palms should face down. Squeeze your shoulder blades together (retraction) and move your elbows slightly behind you. Do not shrug your shoulders upward while you do this. Hold for __________ seconds. Slowly return to the starting position. Repeat __________ times. Complete this exercise __________ times a day. Shoulder extension  Sit in a stable chair without armrests, or stand up. Secure an exercise band to a stable object in front of you so the band is above shoulder height. Hold one end of the exercise band in each hand. Straighten your elbows and lift your hands up to shoulder height. Squeeze your shoulder blades together and pull your hands down to the sides of your thighs (extension). Stop when your hands are straight down by your sides. Do not let your hands go behind your body. Hold for __________  seconds. Slowly return to the starting position. Repeat __________ times. Complete this exercise __________ times a day. This information is not intended to replace advice given to you by your health care provider. Make sure you discuss any questions you have with your health care provider. Document Revised: 11/05/2018 Document Reviewed: 08/09/2018 Elsevier Patient Education  [place].  Shoulder Range of Motion Exercises Shoulder range of motion (ROM) exercises are done to keep the shoulder moving freely or to increase movement. They are often recommended for people who have shoulder pain or stiffness or who are recovering from a shoulder surgery. Phase 1 exercises When you are able, do this exercise 1-2 times per day for 30-60 seconds in each  direction, or as directed by your health care provider. Pendulum exercise To do this exercise while sitting: 1. Sit in a chair or at the edge of your bed with your feet flat on the floor. 2. Let your affected arm hang down in front of you over the edge of the bed or chair. 3. Relax your shoulder, arm, and hand. 4. Rock your body so your arm gently swings in small circles. You can also use your unaffected arm to start the motion. 5. Repeat changing the direction of the circles, swinging your arm left and right, and swinging your arm forward and back. To do this exercise while standing: 1. Stand next to a sturdy chair or table, and hold on to it with your hand on your unaffected side. 2. Bend forward at the waist. 3. Bend your knees slightly. 4. Relax your shoulder, arm, and hand. 5. While keeping your shoulder relaxed, use body motion to swing your arm in small circles. 6. Repeat changing the direction of the circles, swinging your arm left and right, and swinging your arm forward and back. 7. Between exercises, stand up tall and take a short break to relax your lower back.  Phase 2 exercises Do these exercises 1-2 times per day or as told  by your health care provider. Hold each stretch for 30 seconds, and repeat 3 times. Do the exercises with one or both arms as instructed by your health care provider. For these exercises, sit at a table with your hand and arm supported by the table. A chair that slides easily or has wheels can be helpful. External rotation 1. Turn your chair so that your affected side is nearest to the table. 2. Place your forearm on the table to your side. Bend your elbow about 90 at the elbow (right angle) and place your hand palm facing down on the table. Your elbow should be about 6 inches away from your side. 3. Keeping your arm on the table, lean your body forward. Abduction 1. Turn your chair so that your affected side is nearest to the table. 2. Place your forearm and hand on the table so that your thumb points toward the ceiling and your arm is straight out to your side. 3. Slide your hand out to the side and away from you, using your unaffected arm to do the work. 4. To increase the stretch, you can slide your chair away from the table. Flexion: forward stretch 1. Sit facing the table. Place your hand and elbow on the table in front of you. 2. Slide your hand forward and away from you, using your unaffected arm to do the work. 3. To increase the stretch, you can slide your chair backward. Phase 3 exercises Do these exercises 1-2 times per day or as told by your health care provider. Hold each stretch for 30 seconds, and repeat 3 times. Do the exercises with one or both arms as instructed by your health care provider. Cross-body stretch: posterior capsule stretch 1. Lift your arm straight out in front of you. 2. Bend your arm 90 at the elbow (right angle) so your forearm moves across your body. 3. Use your other arm to gently pull the elbow across your body, toward your other shoulder. Wall climbs 1. Stand with your affected arm extended out to the side with your hand resting on a door  frame. 2. Slide your hand slowly up the door frame. 3. To increase the stretch, step through the door frame.  Keep your body upright and do not lean. Wand exercises You will need a cane, a piece of PVC pipe, or a sturdy wooden dowel for wand exercises. Flexion To do this exercise while standing: 1. Hold the wand with both of your hands, palms down. 2. Using the other arm to help, lift your arms up and over your head, if able. 3. Push upward with your other arm to gently increase the stretch. To do this exercise while lying down: 1. Lie on your back with your elbows resting on the floor and the wand in both your hands. Your hands will be palm down, or pointing toward your feet. 2. Lift your hands toward the ceiling, using your unaffected arm to help if needed. 3. Bring your arms overhead as able, using your unaffected arm to help if needed. Internal rotation 1. Stand while holding the wand behind you with both hands. Your unaffected arm should be extended above your head with the arm of the affected side extended behind you at the level of your waist. The wand should be pointing straight up and down as you hold it. 2. Slowly pull the wand up behind your back by straightening the elbow of your unaffected arm and bending the elbow of your affected arm. External rotation 1. Lie on your back with your affected upper arm supported on a small pillow or rolled towel. When you first do this exercise, keep your upper arm close to your body. Over time, bring your arm up to a 90 angle out to the side. 2. Hold the wand across your stomach and with both hands palm up. Your elbow on your affected side should be bent at a 90 angle. 3. Use your unaffected side to help push your forearm away from you and toward the floor. Keep your elbow on your affected side bent at a 90 angle. Contact a health care provider if you have:  New or increasing pain.  New numbness, tingling, weakness, or discoloration in your  arm or hand. This information is not intended to replace advice given to you by your health care provider. Make sure you discuss any questions you have with your health care provider. Document Revised: 08/26/2017 Document Reviewed: 08/26/2017 Elsevier Patient Education  [place].  Shoulder Exercises Ask your health care provider which exercises are safe for you. Do exercises exactly as told by your health care provider and adjust them as directed. It is normal to feel mild stretching, pulling, tightness, or discomfort as you do these exercises. Stop right away if you feel sudden pain or your pain gets worse. Do not begin these exercises until told by your health care provider. Stretching exercises External rotation and abduction This exercise is sometimes called corner stretch. This exercise rotates your arm outward (external rotation) and moves your arm out from your body (abduction). 1. Stand in a doorway with one of your feet slightly in front of the other. This is called a staggered stance. If you cannot reach your forearms to the door frame, stand facing a corner of a room. 2. Choose one of the following positions as told by your health care provider: ? Place your hands and forearms on the door frame above your head. ? Place your hands and forearms on the door frame at the height of your head. ? Place your hands on the door frame at the height of your elbows. 3. Slowly move your weight onto your front foot until you feel a stretch across  your chest and in the front of your shoulders. Keep your head and chest upright and keep your abdominal muscles tight. 4. Hold for __________ seconds. 5. To release the stretch, shift your weight to your back foot. Repeat __________ times. Complete this exercise __________ times a day. Extension, standing 1. Stand and hold a broomstick, a cane, or a similar object behind your back. ? Your hands should be a little wider than shoulder width  apart. ? Your palms should face away from your back. 2. Keeping your elbows straight and your shoulder muscles relaxed, move the stick away from your body until you feel a stretch in your shoulders (extension). ? Avoid shrugging your shoulders while you move the stick. Keep your shoulder blades tucked down toward the middle of your back. 3. Hold for __________ seconds. 4. Slowly return to the starting position. Repeat __________ times. Complete this exercise __________ times a day. Range-of-motion exercises Pendulum  1. Stand near a wall or a surface that you can hold onto for balance. 2. Bend at the waist and let your left / right arm hang straight down. Use your other arm to support you. Keep your back straight and do not lock your knees. 3. Relax your left / right arm and shoulder muscles, and move your hips and your trunk so your left / right arm swings freely. Your arm should swing because of the motion of your body, not because you are using your arm or shoulder muscles. 4. Keep moving your hips and trunk so your arm swings in the following directions, as told by your health care provider: ? Side to side. ? Forward and backward. ? In clockwise and counterclockwise circles. 5. Continue each motion for __________ seconds, or for as long as told by your health care provider. 6. Slowly return to the starting position. Repeat __________ times. Complete this exercise __________ times a day. Shoulder flexion, standing  1. Stand and hold a broomstick, a cane, or a similar object. Place your hands a little more than shoulder width apart on the object. Your left / right hand should be palm up, and your other hand should be palm down. 2. Keep your elbow straight and your shoulder muscles relaxed. Push the stick up with your healthy arm to raise your left / right arm in front of your body, and then over your head until you feel a stretch in your shoulder (flexion). ? Avoid shrugging your shoulder  while you raise your arm. Keep your shoulder blade tucked down toward the middle of your back. 3. Hold for __________ seconds. 4. Slowly return to the starting position. Repeat __________ times. Complete this exercise __________ times a day. Shoulder abduction, standing 1. Stand and hold a broomstick, a cane, or a similar object. Place your hands a little more than shoulder width apart on the object. Your left / right hand should be palm up, and your other hand should be palm down. 2. Keep your elbow straight and your shoulder muscles relaxed. Push the object across your body toward your left / right side. Raise your left / right arm to the side of your body (abduction) until you feel a stretch in your shoulder. ? Do not raise your arm above shoulder height unless your health care provider tells you to do that. ? If directed, raise your arm over your head. ? Avoid shrugging your shoulder while you raise your arm. Keep your shoulder blade tucked down toward the middle of your back. 3. Hold  for __________ seconds. 4. Slowly return to the starting position. Repeat __________ times. Complete this exercise __________ times a day. Internal rotation  1. Place your left / right hand behind your back, palm up. 2. Use your other hand to dangle an exercise band, a towel, or a similar object over your shoulder. Grasp the band with your left / right hand so you are holding on to both ends. 3. Gently pull up on the band until you feel a stretch in the front of your left / right shoulder. The movement of your arm toward the center of your body is called internal rotation. ? Avoid shrugging your shoulder while you raise your arm. Keep your shoulder blade tucked down toward the middle of your back. 4. Hold for __________ seconds. 5. Release the stretch by letting go of the band and lowering your hands. Repeat __________ times. Complete this exercise __________ times a day. Strengthening exercises External  rotation  1. Sit in a stable chair without armrests. 2. Secure an exercise band to a stable object at elbow height on your left / right side. 3. Place a soft object, such as a folded towel or a small pillow, between your left / right upper arm and your body to move your elbow about 4 inches (10 cm) away from your side. 4. Hold the end of the exercise band so it is tight and there is no slack. 5. Keeping your elbow pressed against the soft object, slowly move your forearm out, away from your abdomen (external rotation). Keep your body steady so only your forearm moves. 6. Hold for __________ seconds. 7. Slowly return to the starting position. Repeat __________ times. Complete this exercise __________ times a day. Shoulder abduction  1. Sit in a stable chair without armrests, or stand up. 2. Hold a __________ weight in your left / right hand, or hold an exercise band with both hands. 3. Start with your arms straight down and your left / right palm facing in, toward your body. 4. Slowly lift your left / right hand out to your side (abduction). Do not lift your hand above shoulder height unless your health care provider tells you that this is safe. ? Keep your arms straight. ? Avoid shrugging your shoulder while you do this movement. Keep your shoulder blade tucked down toward the middle of your back. 5. Hold for __________ seconds. 6. Slowly lower your arm, and return to the starting position. Repeat __________ times. Complete this exercise __________ times a day. Shoulder extension 1. Sit in a stable chair without armrests, or stand up. 2. Secure an exercise band to a stable object in front of you so it is at shoulder height. 3. Hold one end of the exercise band in each hand. Your palms should face each other. 4. Straighten your elbows and lift your hands up to shoulder height. 5. Step back, away from the secured end of the exercise band, until the band is tight and there is no  slack. 6. Squeeze your shoulder blades together as you pull your hands down to the sides of your thighs (extension). Stop when your hands are straight down by your sides. Do not let your hands go behind your body. 7. Hold for __________ seconds. 8. Slowly return to the starting position. Repeat __________ times. Complete this exercise __________ times a day. Shoulder row 1. Sit in a stable chair without armrests, or stand up. 2. Secure an exercise band to a stable object in front of  you so it is at waist height. 3. Hold one end of the exercise band in each hand. Position your palms so that your thumbs are facing the ceiling (neutral position). 4. Bend each of your elbows to a 90-degree angle (right angle) and keep your upper arms at your sides. 5. Step back until the band is tight and there is no slack. 6. Slowly pull your elbows back behind you. 7. Hold for __________ seconds. 8. Slowly return to the starting position. Repeat __________ times. Complete this exercise __________ times a day. Shoulder press-ups  1. Sit in a stable chair that has armrests. Sit upright, with your feet flat on the floor. 2. Put your hands on the armrests so your elbows are bent and your fingers are pointing forward. Your hands should be about even with the sides of your body. 3. Push down on the armrests and use your arms to lift yourself off the chair. Straighten your elbows and lift yourself up as much as you comfortably can. ? Move your shoulder blades down, and avoid letting your shoulders move up toward your ears. ? Keep your feet on the ground. As you get stronger, your feet should support less of your body weight as you lift yourself up. 4. Hold for __________ seconds. 5. Slowly lower yourself back into the chair. Repeat __________ times. Complete this exercise __________ times a day. Wall push-ups  1. Stand so you are facing a stable wall. Your feet should be about one arm-length away from the  wall. 2. Lean forward and place your palms on the wall at shoulder height. 3. Keep your feet flat on the floor as you bend your elbows and lean forward toward the wall. 4. Hold for __________ seconds. 5. Straighten your elbows to push yourself back to the starting position. Repeat __________ times. Complete this exercise __________ times a day. This information is not intended to replace advice given to you by your health care provider. Make sure you discuss any questions you have with your health care provider. Document Revised: 11/05/2018 Document Reviewed: 08/13/2018 Elsevier Patient Education  [place].

## 2020-07-24 NOTE — Progress Notes (Signed)
Chief Complaint  Patient presents with  . Follow-up    Pt c/o left side shoulder pain x3weeks  . Sinus Problem    Pt c/o sinus drainage when laying down x3weeks   F/u  1. Disc booster shot rec pfizer  2. pnd and sinus pain/pressure x 3 weeks and left shoulder top pain x 3 weeks nothing tried other than otc meds singulair, xyzal flonase, azelastine  With h/o asthma/COPD no cough just sinus pressure  3. H/o dm 2 stopped taking metformin xr 500 mg and feels better will check A1C she reports this was making her feel sluggish  She did eat this am sausage and eggs  4. Muscle cramps diffusely will check labs today and disc otc theraworx  5. HTN with low BP and DM 2 A1C stopped metformin see #3 on imdur 30 mg bid, lasix 20 mg qd losartan 25 mg qd  Review of Systems  Constitutional: Negative for weight loss.  HENT: Negative for hearing loss.   Eyes: Negative for blurred vision.  Respiratory: Negative for shortness of breath.   Cardiovascular: Negative for chest pain.  Gastrointestinal: Negative for abdominal pain.  Musculoskeletal: Positive for joint pain and myalgias. Negative for falls.       +muscle cramps  Skin: Negative for rash.  Neurological: Negative for headaches.  Psychiatric/Behavioral: Negative for depression.   Past Medical History:  Diagnosis Date  . Allergy   . Anemia   . Arthritis   . Asthma   . Bacterial vaginitis   . Blood in stool   . Brachial neuritis or radiculitis NOS   . 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 being followed by neurosurgery/eye doctor and pcp. monitoring size.causes sinus problems  . Bronchitis   . Cardiac arrhythmia   . Cervical neck pain with evidence of disc disease    C5/6 disease, MRI done late 2012 - no records available  . Chronic combined systolic and diastolic CHF (congestive heart failure) ([place])    a. 08/2010 Echo: mildly reduced EF 40-45%, mild diffuse hypokinesis; b. 06/2016 Echo: EF 50%, no rwma, mild to mod TR; c.  03/2017 Echo: EF 40-45%, Gr1 DD (prior echo reviewed and EF felt to be lower than reported).  . Chronic pain   . Cocaine abuse, in remission ([place])    clean x 24 years  . Colon polyps   . COPD (chronic obstructive pulmonary disease) ([place])    a. 12/2018 PFT: No obvious obst/restrictive dzs.  . Coronary artery disease    a. PCI of LCX 2003; b. PCI of the LAD 2012 with a (2.5 x 8 mm BMS);  c.s/p CABG 4/12: L-LAD, VG-Dx, VG-OM, VG-RCA (Dr. Prescott Gum);  d. 01/2012 MV: inf infarct, attenuation, no ischemia; e. 02/2017 MV: signifi attenuation artifact. Fixed basal antlat/inflat scar vs artifact. Reversible apical lat and mid antlat defect - ? atten vs ischemia. F/u echo w/o wma->Med rx.  . Depression   . Diabetes mellitus without complication ([place])   . Family history of colon cancer   . Generalized headaches    frequent  . GERD (gastroesophageal reflux disease)   . Headache   . Heart murmur   . Hematochezia   . Hepatitis    history of hepatitis b  . History of cervical cancer    s/p cryotherapy  . History of drug abuse ([place])    cocaine, marijuana, clean since 1989  . History of hepatitis B    from eating undercooked liver  .  History of MI (myocardial infarction)   . Hyperlipidemia   . Hypertension   . Ischemic cardiomyopathy    a. 03/2017 Echo: EF 40-45%, Gr1 DD.  [name] Myocardial infarction [facility]) 2003, 2012  . PAD (peripheral artery disease) ([place])    a. s/p Right SFA atherectomy and PTA 01/15/11; b. 07/2018 ABI: R 1.02, L 1.09.  [name] Pituitary mass ([place])    a. 12/2018 MRI Brain: Stable pituitary mass w/ 74mm area of necrosis. Mass abuts R cavernous sinus w/o definite invasion.  . Polyp of colon   . Seasonal allergies   . Sleep apnea    uses cpap  . Smoking history    quit 07/2010  . Thyroid disease   . Urinary incontinence    Past Surgical History:  Procedure Laterality Date  . ABDOMINAL HYSTERECTOMY     2003  . CHOLECYSTECTOMY  1986  . COLONOSCOPY  2008   3 polyps  . COLONOSCOPY WITH  PROPOFOL N/A 02/06/2015   Procedure: COLONOSCOPY WITH PROPOFOL;  Surgeon: Lucilla Lame, MD;  Location: [facility] ENDOSCOPY;  Service: Endoscopy;  Laterality: N/A;  . COLONOSCOPY WITH PROPOFOL N/A 01/27/2017   Procedure: COLONOSCOPY WITH PROPOFOL;  Surgeon: Lucilla Lame, MD;  Location: [facility] ENDOSCOPY;  Service: Endoscopy;  Laterality: N/A;  . CORONARY ANGIOPLASTY     w/ stent placement x2  . CORONARY ARTERY BYPASS GRAFT  2012   Dr [name]  . CORONARY STENT PLACEMENT  2003   S/P MI  . CORONARY STENT PLACEMENT  2007   [place]  . ESOPHAGOGASTRODUODENOSCOPY (EGD) WITH PROPOFOL N/A 02/06/2015   Procedure: ESOPHAGOGASTRODUODENOSCOPY (EGD) WITH PROPOFOL;  Surgeon: Lucilla Lame, MD;  Location: [facility] ENDOSCOPY;  Service: Endoscopy;  Laterality: N/A;  . ESOPHAGOGASTRODUODENOSCOPY (EGD) WITH PROPOFOL N/A 01/27/2017   Procedure: ESOPHAGOGASTRODUODENOSCOPY (EGD) WITH PROPOFOL;  Surgeon: Lucilla Lame, MD;  Location: [facility] ENDOSCOPY;  Service: Endoscopy;  Laterality: N/A;  . ESOPHAGOGASTRODUODENOSCOPY (EGD) WITH PROPOFOL N/A 09/01/2019   Procedure: ESOPHAGOGASTRODUODENOSCOPY (EGD) WITH PROPOFOL;  Surgeon: Lin Landsman, MD;  Location: [facility] ENDOSCOPY;  Service: Gastroenterology;  Laterality: N/A;  . FEMORAL ARTERY STENT  10/2010   right sided (Dr. Burt Knack)  . KNEE ARTHROSCOPY WITH MEDIAL MENISECTOMY Right 08/20/2017   Procedure: KNEE ARTHROSCOPY WITH MEDIAL  AND LATERAL MENISECTOMY;  Surgeon: Hessie Knows, MD;  Location: [facility] ORS;  Service: Orthopedics;  Laterality: Right;  . POSTERIOR CERVICAL LAMINECTOMY N/A 03/08/2018   Procedure: POSTERIOR CERVICAL LAMINECTOMY-C7;  Surgeon: Deetta Perla, MD;  Location: [facility] ORS;  Service: Neurosurgery;  Laterality: N/A;  . TONSILLECTOMY    . TOTAL KNEE ARTHROPLASTY Right 04/14/2019   Procedure: RIGHT TOTAL KNEE ARTHROPLASTY;  Surgeon: Hessie Knows, MD;  Location: [facility] ORS;  Service: Orthopedics;  Laterality: Right;  . TUBAL LIGATION     Family History  Problem Relation Age of Onset   . Hypertension Father   . Heart failure Father   . Diabetes Father   . Colon cancer Father 35  . Glaucoma Father   . Cancer Father        colorectal   . Heart disease Father   . Other Father        glaucoma  . Breast cancer Mother 97       breast cancer, late 51's  . Cancer Mother        breast  . Brain cancer Sister   . Arthritis Sister   . Diabetes Sister   . Hypertension Sister   . Kidney disease Sister   . Diabetes Brother   . Hypertension  Brother   . Other Sister        brain tumor   . Acute myelogenous leukemia Grandson        06/2019  . Coronary artery disease Neg Hx   . Stroke Neg Hx    Social History   Socioeconomic History  . Marital status: Legally Separated    Spouse name: Not on file  . Number of children: Not on file  . Years of education: Not on file  . Highest education level: Not on file  Occupational History  . Not on file  Tobacco Use  . Smoking status: Former Smoker    Packs/day: 1.00    Years: 38.00    Pack years: 38.00    Types: Cigarettes    Quit date: 08/12/2010    Years since quitting: 9.9  . Smokeless tobacco: Never Used  Vaping Use  . Vaping Use: Never used  Substance and Sexual Activity  . Alcohol use: No  . Drug use: No    Types: Cocaine    Comment: Remote Hx (crack cocaine and marijuana).none since 7yrs plus  . Sexual activity: Yes  Other Topics Concern  . 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 Difficulty of Paying Living Expenses: Not hard at all  Food Insecurity: No Food Insecurity  . Worried About  Charity fundraiser in the Last Year: Never true  . Ran Out of Food in the Last Year: Never true  Transportation Needs: No Transportation Needs  . Lack of Transportation (Medical): No  . Lack of Transportation (Non-Medical): No  Physical Activity: Not on file  Stress: No Stress Concern Present  . Feeling of Stress : Not at all  Social Connections: Unknown  . Frequency of Communication with Friends and Family: More than three times a week  . Frequency of Social Gatherings with Friends and Family: Once a week  . Attends Religious Services: 1 to 4 times per year  . Active Member of Clubs or Organizations: Yes  . Attends Archivist Meetings: More than 4 times per year  . Marital Status: Not on file  Intimate Partner Violence: Not At Risk  . Fear of Current or Ex-Partner: No  . Emotionally Abused: No  . Physically Abused: No  . Sexually Abused: No   Current Meds  Medication Sig  . Azelastine HCl 0.15 % SOLN Place 2 sprays into both nostrils daily as needed (allergies).   [name] azithromycin (ZITHROMAX) 250 MG tablet 2 pills day 1 and 1 pill day 2-5  . cyclobenzaprine (FLEXERIL) 5 MG tablet Take 1-2 tablets (5-10 mg total) by mouth at bedtime as needed for muscle spasms.  [name] dicyclomine (BENTYL) 10 MG capsule Take 1 capsule (10 mg total) by mouth 3 (three) times daily before meals. As needed for abdominal pain  . esomeprazole (NEXIUM) 40 MG capsule Take 1 capsule (40 mg total) by mouth daily. Reported on 09/04/2015  . ezetimibe (ZETIA) 10 MG tablet Take  1 tablet by mouth once daily  . fluconazole (DIFLUCAN) 150 MG tablet Take 1 tablet (150 mg total) by mouth once for 1 dose.  . fluticasone (FLONASE) 50 MCG/ACT nasal spray Place 2 sprays into both nostrils daily as needed for allergies. Max dose 2 sprays each nostril  . Fluticasone-Umeclidin-Vilant (TRELEGY ELLIPTA) 100-62.5-25 MCG/INH AEPB Inhale 1 puff into the lungs daily.  . furosemide (LASIX) 20 MG tablet TAKE 1 TABLET BY MOUTH ONCE  DAILY AND A SECOND TABLET IF NEEDED FOR FLUID BUILD UP  . glucose blood (ONE TOUCH ULTRA TEST) test strip Use as instructed check cbg qd E11.9  . hydrOXYzine (ATARAX/VISTARIL) 25 MG tablet Take 1-2 tablets (25-50 mg total) by mouth daily as needed.  [name] ipratropium-albuterol (DUONEB) 0.5-2.5 (3) MG/3ML SOLN Take 3 mLs by nebulization 2 (two) times daily as needed (wheezing/shortness of breath).  . isosorbide mononitrate (IMDUR) 30 MG 24 hr tablet Take 1 tablet by mouth twice daily  . Lancets MISC 1 Device by Does not apply route daily. Lancets E11.9  . levocetirizine (XYZAL) 5 MG tablet Take 1 tablet (5 mg total) by mouth at bedtime as needed for allergies.  [name] meclizine (ANTIVERT) 25 MG tablet Take 1 tablet (25 mg total) by mouth 2 (two) times daily as needed for dizziness.  . metFORMIN (GLUCOPHAGE XR) 500 MG 24 hr tablet Take 1 tablet (500 mg total) by mouth daily with breakfast.  . mirabegron ER (MYRBETRIQ) 25 MG TB24 tablet Take 1 tablet (25 mg total) by mouth daily.  . nitroGLYCERIN (NITROSTAT) 0.4 MG SL tablet Place 1 tablet (0.4 mg total) under the tongue every 5 (five) minutes as needed.  . nystatin (MYCOSTATIN) 100000 UNIT/ML suspension Use as directed 5 mLs (500,000 Units total) in the mouth or throat 4 (four) times daily.  . ondansetron (ZOFRAN) 4 MG tablet Take 1 tablet (4 mg total) by mouth every 8 (eight) hours as needed for nausea or vomiting.  [name] PARoxetine (PAXIL) 20 MG tablet Take 1 tablet (20 mg total) by mouth daily.  . potassium chloride (KLOR-CON) 10 MEQ tablet Take 1 tablet (10 mEq total) by mouth daily.  . predniSONE (DELTASONE) 20 MG tablet Take 1 tablet (20 mg total) by mouth daily with breakfast.  . pregabalin (LYRICA) 75 MG capsule Take by mouth.  . pregabalin (LYRICA) 75 MG capsule Take 75 mg by mouth at bedtime.  [name] REPATHA SURECLICK [id number] MG/ML SOAJ INJECT 1 PEN INTO THE SKIN EVERY 14 (FOURTEEN) DAYS  . rosuvastatin (CRESTOR) 20 MG tablet Take 1 tablet (20 mg total) by  mouth at bedtime.  . sodium chloride (OCEAN) 0.65 % SOLN nasal spray Place 2 sprays into both nostrils daily as needed for congestion.  . traZODone (DESYREL) 50 MG tablet Take 0.5-1 tablets (25-50 mg total) by mouth at bedtime as needed for sleep.  Alveda Reasons 2.5 MG TABS tablet Take 1 tablet by mouth twice daily  . [DISCONTINUED] albuterol (VENTOLIN HFA) 108 (90 Base) MCG/ACT inhaler Inhale 2 puffs into the lungs every 6 (six) hours as needed for wheezing or shortness of breath.  . [DISCONTINUED] aspirin EC 81 MG tablet Take 1 tablet (81 mg total) by mouth daily.  . [DISCONTINUED] methocarbamol (ROBAXIN) 500 MG tablet Take 1 tablet (500 mg total) by mouth every 6 (six) hours as needed for muscle spasms.  . [DISCONTINUED] montelukast (SINGULAIR) 10 MG tablet TAKE 1 TABLET BY MOUTH AT BEDTIME   Allergies  Allergen Reactions  . Latex Rash       .  Shellfish Allergy Anaphylaxis    Hard shellfish/swelling in throat  . Sulfonamide Derivatives Anaphylaxis    Swelling in throat.  . Watermelon [Citrullus Vulgaris] Other (See Comments)    Throat itchy  . Soy Allergy     Diarrhea/upset stomach    . Other Swelling   Recent Results (from the past 2160 hour(s))  HM DIABETES EYE EXAM     Status: None   Collection Time: 05/24/20 12:00 AM  Result Value Ref Range   HM Diabetic Eye Exam No Retinopathy No Retinopathy    Comment: AE neg retinopathy   Objective  Body mass index is 36.98 kg/m. Wt Readings from Last 3 Encounters:  07/24/20 222 lb 3.2 oz (100.8 kg)  04/19/20 208 lb 6.4 oz (94.5 kg)  04/17/20 209 lb 6 oz (95 kg)   Temp Readings from Last 3 Encounters:  07/24/20 98.4 F (36.9 C)  04/19/20 98.5 F (36.9 C) (Oral)  04/17/20 98.1 F (36.7 C) (Oral)   BP Readings from Last 3 Encounters:  07/24/20 (!) 100/58  04/19/20 124/76  04/17/20 112/78   Pulse Readings from Last 3 Encounters:  07/24/20 89  04/19/20 74  04/17/20 79    Physical Exam Vitals and nursing note reviewed.   Constitutional:      Appearance: Normal appearance. She is well-developed and well-groomed. She is obese.  HENT:     Head: Normocephalic and atraumatic.     Nose:     Right Sinus: Maxillary sinus tenderness and frontal sinus tenderness present.     Left Sinus: Maxillary sinus tenderness and frontal sinus tenderness present.  Eyes:     Conjunctiva/sclera: Conjunctivae normal.     Pupils: Pupils are equal, round, and reactive to light.  Cardiovascular:     Rate and Rhythm: Normal rate and regular rhythm.     Heart sounds: Normal heart sounds. No murmur heard.   Pulmonary:     Effort: Pulmonary effort is normal.     Breath sounds: Normal breath sounds.  Abdominal:     General: Abdomen is flat. Bowel sounds are normal.  Skin:    General: Skin is warm and dry.  Neurological:     General: No focal deficit present.     Mental Status: She is alert and oriented to person, place, and time. Mental status is at baseline.     Gait: Gait normal.  Psychiatric:        Attention and Perception: Attention and perception normal.        Mood and Affect: Mood and affect normal.        Speech: Speech normal.        Behavior: Behavior normal. Behavior is cooperative.        Thought Content: Thought content normal.        Cognition and Memory: Cognition and memory normal.        Judgment: Judgment normal.     Assessment  Plan  Pansinusitis, unspecified chronicity - Plan: sodium chloride (OCEAN) 0.65 % SOLN nasal spray, predniSONE (DELTASONE) 20 MG tablet, azithromycin (ZITHROMAX) 250 MG tablet Cont NS  Consider ENT in the future  Muscle cramps - Plan: TSH  Chronic left shoulder pain F/u [facility] if continues given # to call   Hypertension associated with diabetes (HCC) - Plan: Comprehensive metabolic panel, Lipid panel, Hemoglobin A1c, CBC with Differential/Platelet With hypotension ask Dr. [name] if can reduce imdur 30 bid to qd  On losartan 25 mg qd, lasix 20 mg qd  toprol 50  mg qd xl   Will do urine and foot exam in the future   Chronic obstructive pulmonary disease, unspecified COPD type ([place]) - Plan: albuterol (VENTOLIN HFA) 108 (90 Base) MCG/ACT inhaler   HM Flu shotutd 10/8/20utd Considershingrix in futureat pharmacy pna 23 had 03/10/16 covid 2/2 pfizer  Consider prevnar in future  S/p hysterectomy no cervix and f/u OB/GYNh/o abnormal pap s/p cryo  -pap 05/08/15 neg papwill Repeat In 5 years   Colonoscopy Dr. Allen Norris 01/2017 polyps, gastritis, diverticulosis referred todayest with Dr. Marius Ditch 03/20/20   Mammogram3/18/2020 negativere orderedsch 09/01/19 left dx mammogram As of 3 or 10/28/19 utd benign cyst likely right breast rec [place] in 6 months to f/u ordered and sch F/u breast surgery pending imaging   DEXAneg 10/13/2018  rec healthy diet and exercise   Provider: Dr. Olivia Mackie [name]-Internal Medicine

## 2020-07-26 ENCOUNTER — Other Ambulatory Visit: Payer: Self-pay | Admitting: Internal Medicine

## 2020-07-26 DIAGNOSIS — R7989 Other specified abnormal findings of blood chemistry: Secondary | ICD-10-CM

## 2020-07-26 DIAGNOSIS — R748 Abnormal levels of other serum enzymes: Secondary | ICD-10-CM

## 2020-07-26 DIAGNOSIS — I10 Essential (primary) hypertension: Secondary | ICD-10-CM

## 2020-07-26 DIAGNOSIS — D696 Thrombocytopenia, unspecified: Secondary | ICD-10-CM

## 2020-07-26 MED ORDER — LOSARTAN POTASSIUM 25 MG PO TABS: 12.5000 mg | ORAL_TABLET | Freq: Every day | ORAL | 3 refills | Status: DC

## 2020-08-03 ENCOUNTER — Ambulatory Visit (INDEPENDENT_AMBULATORY_CARE_PROVIDER_SITE_OTHER): Payer: Medicare HMO

## 2020-08-03 VITALS — Ht 65.0 in | Wt 222.0 lb

## 2020-08-03 DIAGNOSIS — Z0000 Encounter for general adult medical examination without abnormal findings: Secondary | ICD-10-CM

## 2020-08-03 NOTE — Progress Notes (Addendum)
Subjective:   [name] is a 60 y.o. female who presents for Medicare Annual (Subsequent) preventive examination.  Review of Systems    No ROS.  Medicare Wellness Virtual Visit.   Cardiac Risk Factors include: advanced age (>44men, >60 women);diabetes mellitus;hypertension     Objective:    Today's Vitals   08/03/20 1301  Weight: 222 lb (100.7 kg)  Height: 5\' 5"  (1.651 m)   Body mass index is 36.94 kg/m.  Advanced Directives 08/03/2020 09/01/2019 08/03/2019 04/14/2019 09/05/2018 03/08/2018 03/08/2018  Does Patient Have a Medical Advance Directive? Yes No Yes No No No No  Type of Paramedic of [place];Living will - [place];Living will - - - -  Does patient want to make changes to medical advance directive? No - Patient declined - No - Patient declined - - - -  Copy of [place] in Chart? Yes - validated most recent copy scanned in chart (See row information) - Yes - validated most recent copy scanned in chart (See row information) - - - -  Would patient like information on creating a medical advance directive? - No - Patient declined - Yes (Inpatient - patient requests chaplain consult to create a medical advance directive) - No - Patient declined No - Patient declined    Current Medications (verified) Outpatient Encounter Medications as of 08/03/2020  Medication Sig   albuterol (VENTOLIN HFA) 108 (90 Base) MCG/ACT inhaler Inhale 2 puffs into the lungs every 6 (six) hours as needed for wheezing or shortness of breath.   aspirin EC 81 MG tablet Take 1 tablet (81 mg total) by mouth daily.   Azelastine HCl 0.15 % SOLN Place 2 sprays into both nostrils daily as needed (allergies).    cyclobenzaprine (FLEXERIL) 5 MG tablet Take 1-2 tablets (5-10 mg total) by mouth at bedtime as needed for muscle spasms.   dicyclomine (BENTYL) 10 MG capsule Take 1 capsule (10 mg total) by mouth 3 (three) times daily before meals. As needed  for abdominal pain   esomeprazole (NEXIUM) 40 MG capsule Take 1 capsule (40 mg total) by mouth daily. Reported on 09/04/2015   ezetimibe (ZETIA) 10 MG tablet Take 1 tablet by mouth once daily   fluticasone (FLONASE) 50 MCG/ACT nasal spray Place 2 sprays into both nostrils daily as needed for allergies. Max dose 2 sprays each nostril   Fluticasone-Umeclidin-Vilant (TRELEGY ELLIPTA) 100-62.5-25 MCG/INH AEPB Inhale 1 puff into the lungs daily.   furosemide (LASIX) 20 MG tablet TAKE 1 TABLET BY MOUTH ONCE DAILY AND A SECOND TABLET IF NEEDED FOR FLUID BUILD UP   glucose blood (ONE TOUCH ULTRA TEST) test strip Use as instructed check cbg qd E11.9   hydrOXYzine (ATARAX/VISTARIL) 25 MG tablet Take 1-2 tablets (25-50 mg total) by mouth daily as needed.   ipratropium-albuterol (DUONEB) 0.5-2.5 (3) MG/3ML SOLN Take 3 mLs by nebulization 2 (two) times daily as needed (wheezing/shortness of breath).   isosorbide mononitrate (IMDUR) 30 MG 24 hr tablet Take 1 tablet by mouth twice daily   Lancets MISC 1 Device by Does not apply route daily. Lancets E11.9   levocetirizine (XYZAL) 5 MG tablet Take 1 tablet (5 mg total) by mouth at bedtime as needed for allergies.   linaclotide (LINZESS) 72 MCG capsule Take 1 capsule (72 mcg total) by mouth daily before breakfast.   losartan (COZAAR) 25 MG tablet Take 0.5 tablets (12.5 mg total) by mouth daily.   meclizine (ANTIVERT) 25 MG tablet Take 1  tablet (25 mg total) by mouth 2 (two) times daily as needed for dizziness.   metFORMIN (GLUCOPHAGE XR) 500 MG 24 hr tablet Take 1 tablet (500 mg total) by mouth daily with breakfast.   metoprolol succinate (TOPROL-XL) 50 MG 24 hr tablet Take 1 tablet (50 mg total) by mouth daily. Take with or immediately following a meal.   mirabegron ER (MYRBETRIQ) 25 MG TB24 tablet Take 1 tablet (25 mg total) by mouth daily.   montelukast (SINGULAIR) 10 MG tablet Take 1 tablet (10 mg total) by mouth at bedtime.   nitroGLYCERIN  (NITROSTAT) 0.4 MG SL tablet Place 1 tablet (0.4 mg total) under the tongue every 5 (five) minutes as needed.   nystatin (MYCOSTATIN) 100000 UNIT/ML suspension Use as directed 5 mLs (500,000 Units total) in the mouth or throat 4 (four) times daily.   ondansetron (ZOFRAN) 4 MG tablet Take 1 tablet (4 mg total) by mouth every 8 (eight) hours as needed for nausea or vomiting.   PARoxetine (PAXIL) 20 MG tablet Take 1 tablet (20 mg total) by mouth daily.   potassium chloride (KLOR-CON) 10 MEQ tablet Take 1 tablet (10 mEq total) by mouth daily.   pregabalin (LYRICA) 75 MG capsule Take by mouth.   pregabalin (LYRICA) 75 MG capsule Take 75 mg by mouth at bedtime.   REPATHA SURECLICK [id number] MG/ML SOAJ INJECT 1 PEN INTO THE SKIN EVERY 14 (FOURTEEN) DAYS   rosuvastatin (CRESTOR) 20 MG tablet Take 1 tablet (20 mg total) by mouth at bedtime.   sodium chloride (OCEAN) 0.65 % SOLN nasal spray Place 2 sprays into both nostrils daily as needed for congestion.   traZODone (DESYREL) 50 MG tablet Take 0.5-1 tablets (25-50 mg total) by mouth at bedtime as needed for sleep.   XARELTO 2.5 MG TABS tablet Take 1 tablet by mouth twice daily   [DISCONTINUED] azithromycin (ZITHROMAX) 250 MG tablet 2 pills day 1 and 1 pill day 2-5   [DISCONTINUED] predniSONE (DELTASONE) 20 MG tablet Take 1 tablet (20 mg total) by mouth daily with breakfast.   No facility-administered encounter medications on file as of 08/03/2020.    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 being followed by neurosurgery/eye doctor and pcp. monitoring size.causes sinus problems   Bronchitis    Cardiac arrhythmia     Cervical neck pain with evidence of disc disease    C5/6 disease, MRI done late 2012 - no records available   Chronic combined systolic and diastolic CHF (congestive heart failure) ([place])    a. 08/2010 Echo: mildly reduced EF 40-45%, mild diffuse hypokinesis; b. 06/2016 Echo: EF 50%, no rwma, mild to mod TR; c. 03/2017 Echo: EF 40-45%, Gr1 DD (prior echo reviewed and EF felt to be lower than reported).   Chronic pain    Cocaine abuse, in remission (HCC)    clean x 24 years   Colon polyps    COPD (chronic obstructive pulmonary disease) ([place])    a. 12/2018 PFT: No obvious obst/restrictive dzs.   Coronary artery disease    a. PCI of LCX 2003; b. PCI of the LAD 2012 with a (2.5 x 8  mm BMS);  c.s/p CABG 4/12: L-LAD, VG-Dx, VG-OM, VG-RCA (Dr. Prescott Gum);  d. 01/2012 MV: inf infarct, attenuation, no ischemia; e. 02/2017 MV: signifi attenuation artifact. Fixed basal antlat/inflat scar vs artifact. Reversible apical lat and mid antlat defect - ? atten vs ischemia. F/u echo w/o wma->Med rx.   Depression    Diabetes mellitus without complication ([place])    Family history of colon cancer    Generalized headaches    frequent   GERD (gastroesophageal reflux disease)    Headache    Heart murmur    Hematochezia    Hepatitis    history of hepatitis b   History of cervical cancer    s/p cryotherapy   History of drug abuse ([place])    cocaine, marijuana, clean since 1989   History of hepatitis B    from eating undercooked liver   History of MI (myocardial infarction)    Hyperlipidemia    Hypertension    Ischemic cardiomyopathy    a. 03/2017 Echo: EF 40-45%, Gr1 DD.   Myocardial infarction ([place]) 2003, 2012   PAD (peripheral artery disease) ([place])    a. s/p Right SFA atherectomy and PTA 01/15/11; b. 07/2018 ABI: R 1.02, L 1.09.   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 Mass abuts R cavernous sinus w/o definite invasion.   Polyp of  colon    Seasonal allergies    Sleep apnea    uses cpap   Smoking history    quit 07/2010   Thyroid disease    Urinary incontinence    Past Surgical History:  Procedure Laterality Date   ABDOMINAL HYSTERECTOMY     2003   CHOLECYSTECTOMY  1986   COLONOSCOPY  2008   3 polyps   COLONOSCOPY WITH PROPOFOL N/A 02/06/2015   Procedure: COLONOSCOPY WITH PROPOFOL;  Surgeon: Lucilla Lame, MD;  Location: [facility] ENDOSCOPY;  Service: Endoscopy;  Laterality: N/A;   COLONOSCOPY WITH PROPOFOL N/A 01/27/2017   Procedure: COLONOSCOPY WITH PROPOFOL;  Surgeon: Lucilla Lame, MD;  Location: [facility] ENDOSCOPY;  Service: Endoscopy;  Laterality: N/A;   CORONARY ANGIOPLASTY     w/ stent placement x2   CORONARY ARTERY BYPASS GRAFT  2012   Dr [name]   CORONARY STENT PLACEMENT  2003   S/P MI   CORONARY STENT PLACEMENT  2007   [place]   ESOPHAGOGASTRODUODENOSCOPY (EGD) WITH PROPOFOL N/A 02/06/2015   Procedure: ESOPHAGOGASTRODUODENOSCOPY (EGD) WITH PROPOFOL;  Surgeon: Lucilla Lame, MD;  Location: [facility] ENDOSCOPY;  Service: Endoscopy;  Laterality: N/A;   ESOPHAGOGASTRODUODENOSCOPY (EGD) WITH PROPOFOL N/A 01/27/2017   Procedure: ESOPHAGOGASTRODUODENOSCOPY (EGD) WITH PROPOFOL;  Surgeon: Lucilla Lame, MD;  Location: [facility] ENDOSCOPY;  Service: Endoscopy;  Laterality: N/A;   ESOPHAGOGASTRODUODENOSCOPY (EGD) WITH PROPOFOL N/A 09/01/2019   Procedure: ESOPHAGOGASTRODUODENOSCOPY (EGD) WITH PROPOFOL;  Surgeon: Lin Landsman, MD;  Location: [facility];  Service: Gastroenterology;  Laterality: N/A;   FEMORAL ARTERY STENT  10/2010   right sided (Dr. Burt Knack)   KNEE ARTHROSCOPY WITH MEDIAL MENISECTOMY Right 08/20/2017   Procedure: KNEE ARTHROSCOPY WITH MEDIAL  AND LATERAL MENISECTOMY;  Surgeon: Hessie Knows, MD;  Location: [facility] ORS;  Service: Orthopedics;  Laterality: Right;   POSTERIOR CERVICAL LAMINECTOMY N/A 03/08/2018   Procedure: POSTERIOR CERVICAL LAMINECTOMY-C7;  Surgeon: Deetta Perla, MD;  Location: [facility]  ORS;  Service: Neurosurgery;  Laterality: N/A;   TONSILLECTOMY     TOTAL KNEE ARTHROPLASTY Right 04/14/2019   Procedure: RIGHT TOTAL KNEE ARTHROPLASTY;  Surgeon:  Hessie Knows, MD;  Location: [facility] ORS;  Service: Orthopedics;  Laterality: Right;   TUBAL LIGATION     Family History  Problem Relation Age of Onset   Hypertension Father    Heart failure Father    Diabetes Father    Colon cancer Father 1   Glaucoma Father    Cancer Father        colorectal    Heart disease Father    Other Father        glaucoma   Breast cancer Mother 18       breast cancer, late 58's   Cancer Mother        breast   Brain cancer Sister    Arthritis Sister    Diabetes Sister    Hypertension Sister    Kidney disease Sister    Diabetes Brother    Hypertension Brother    Other Sister        brain tumor    Acute myelogenous leukemia Grandson        06/2019   Coronary artery disease Neg Hx    Stroke Neg Hx    Social History   Socioeconomic History   Marital status: Legally Separated    Spouse name: Not on file   Number of children: Not on file   Years of education: Not on file   Highest education level: Not on file  Occupational History   Not on file  Tobacco Use   Smoking status: Former Smoker    Packs/day: 1.00    Years: 38.00    Pack years: 38.00    Types: Cigarettes    Quit date: 08/12/2010    Years since quitting: 9.9   Smokeless tobacco: Never Used  Vaping Use   Vaping Use: Never used  Substance and Sexual Activity   Alcohol use: No   Drug use: No    Types: Cocaine    Comment: Remote Hx (crack cocaine and marijuana).none since 50yrs plus   Sexual activity: Yes  Other Topics Concern   Not on file  Social History Narrative   Caffeine: 1 cup coffee/day   Lives with family, no pets   Occupation: industrial work, 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   Edu: 11th grade   Activity: no regular exercise   Diet: good water, vegetables daily, low salt diet    Lives with sister and other family    No guns, wears seat belts, safe in relationship    2 kids    GED 1 year of college    Social Determinants of Health   Financial Resource Strain: Low Risk    Difficulty of Paying Living Expenses: Not hard at all  Food Insecurity: No Food Insecurity   Worried About Charity fundraiser in the Last Year: Never true   Arboriculturist in the Last Year: Never true  Transportation Needs: No Transportation Needs   Lack of Transportation (Medical): No   Lack of Transportation (Non-Medical): No  Physical Activity: Unknown   Days of Exercise per Week: 0 days   Minutes of Exercise per Session: Not on file  Stress: No Stress Concern Present   Feeling of Stress : Not at all  Social Connections: Unknown   Frequency of Communication with Friends and Family: More than three times a week   Frequency of Social Gatherings with Friends and Family: Once a week   Attends Religious Services: 1 to 4 times per year   Active Member of [employer] or Organizations:  Yes   Attends Archivist Meetings: More than 4 times per year   Marital Status: Not on file    Tobacco Counseling Counseling given: Not Answered   Clinical Intake:  Pre-visit preparation completed: Yes        Diabetes: Yes (Followed by pcp)  How often do you need to have someone help you when you read instructions, pamphlets, or other written materials from your doctor or pharmacy?: 1 - Never  Nutrition Risk Assessment: Has the patient had any N/V/D?  No  Does the patient have any non-healing wounds?  No  Has the patient had any unintentional weight loss or weight gain?  No   Diabetes: If diabetic, was a CBG obtained today?  No  Did the patient bring in their glucometer from home?  No  How often do you monitor your CBG's? Once a month.   Financial Strains and Diabetes Management: Are you having any financial strains with the device, your supplies or your medication? No .    Diabetic Exams: Diabetic Eye Exam: Completed 05/24/20   Diabetic Foot Exam- Referral request for podiatrist sent to PCP.  Complaint of intermittent great toe R foot tender to touch for a couple of months. Denies wounds or break in skin. Wears supportive shoes.   Referral request for Nutritionist sent to PCP. Patient notes she is unsure of what is specific to her nutritional needs regarding diabetes.    Interpreter Needed?: No      Activities of Daily Living In your present state of health, do you have any difficulty performing the following activities: 08/03/2020  Hearing? N  Vision? N  Difficulty concentrating or making decisions? N  Walking or climbing stairs? Y  Comment Paces self when walking  Dressing or bathing? N  Doing errands, shopping? N  Preparing Food and eating ? N  Using the Toilet? N  In the past six months, have you accidently leaked urine? Y  Comment Managed with daily depend  Do you have problems with loss of bowel control? N  Managing your Medications? N  Managing your Finances? Y  Comment Sister assist  Housekeeping or managing your Housekeeping? N  Some recent data might be hidden    Patient Care Team: [name], [name], MD as PCP - General (Internal Medicine) Minna Merritts, MD as PCP - Cardiology (Cardiology)  Indicate any recent Medical Services you may have received from other than [facility] providers in the past year (date may be approximate).     Assessment:   This is a routine wellness examination for [place].  I connected with Deleah today by telephone and verified that I am speaking with the correct person using two identifiers. Location patient: home Location provider: work Persons participating in the virtual visit: patient, Marine scientist.    I discussed the limitations, risks, security and privacy concerns of performing an evaluation and management service by telephone and the availability of in person appointments. The patient  expressed understanding and verbally consented to this telephonic visit.    Interactive audio and video telecommunications were attempted between this provider and patient, however failed, due to patient having technical difficulties OR patient did not have access to video capability.  We continued and completed visit with audio only.  Some vital signs may be absent or patient reported.   Hearing/Vision screen  Hearing Screening   125Hz  250Hz  500Hz  1000Hz  2000Hz  3000Hz  4000Hz  6000Hz  8000Hz   Right ear:           Left  ear:           Comments: Patient is able to hear conversational tones without difficulty. No issues reported.   Vision Screening Comments: Wears corrective lenses  Visual acuity not assessed, virtual visit. They have seen their ophthalmologist.  Dietary issues and exercise activities discussed: Current Exercise Habits: The patient does not participate in regular exercise at present  [place] water intake, plans to increase  Goals      Patient Stated     DIET - [place] (pt-stated)      Follow up with Primary Care Provider (pt-stated)      As needed      Depression Screen PHQ 2/9 Scores 08/03/2020 07/24/2020 02/21/2020 10/13/2019 08/03/2019 07/14/2019 03/29/2019  PHQ - 2 Score 0 0 3 - 1 2 0  PHQ- 9 Score - - 9 - 2 8 -  Exception Documentation - - - Other- indicate reason in comment box - - -  Not completed - - - Patient's Grandson is very sick - - -    Fall Risk Fall Risk  08/03/2020 07/24/2020 04/19/2020 02/21/2020 12/02/2019  Falls in the past year? 0 0 0 0 0  Number falls in past yr: 0 0 0 0 0  Injury with Fall? 0 0 0 0 0  Risk for fall due to : - - - No Fall Risks No Fall Risks  Follow up Falls evaluation completed Falls evaluation completed Falls evaluation completed Falls evaluation completed Falls evaluation completed    [place]: Handrails in use when climbing stairs? Yes Home free of loose throw rugs in  walkways, pet beds, electrical cords, etc? Yes  Adequate lighting in your home to reduce risk of falls? Yes   ASSISTIVE DEVICES UTILIZED TO PREVENT FALLS: Life alert? No  Use of a cane, walker or w/c? No  Grab bars in the bathroom? No  Shower chair or bench in shower? No  Elevated toilet seat or a handicapped toilet? No   TIMED UP AND GO: Was the test performed? No . Virtual visit.   Cognitive Function:     6CIT Screen 08/03/2020 08/03/2019  What Year? 0 points 0 points  What month? 0 points 0 points  What time? 0 points 0 points  Count back from 20 0 points 0 points  Months in reverse 4 points 0 points  Repeat phrase 4 points 0 points  Total Score 8 0    Immunizations Immunization History  Administered Date(s) Administered   Influenza Split 05/22/2015, 04/21/2016   Influenza,inj,Quad PF,6+ Mos 04/05/2018, 03/29/2019, 04/19/2020   PFIZER SARS-COV-2 Vaccination 01/03/2020, 01/24/2020   Tdap 05/05/2019   Health Maintenance Health Maintenance  Topic Date Due   FOOT EXAM  Never done   COVID-19 Vaccine (3 - Pfizer risk 4-dose series) 08/09/2020 (Originally 02/21/2020)   HEMOGLOBIN A1C  01/22/2021   PAP SMEAR-Modifier  05/12/2021   OPHTHALMOLOGY EXAM  05/24/2021   MAMMOGRAM  10/17/2021   COLONOSCOPY (Pts 45-8yrs Insurance coverage will need to be confirmed)  01/27/2022   TETANUS/TDAP  05/04/2029   INFLUENZA VACCINE  Completed   PNEUMOCOCCAL POLYSACCHARIDE VACCINE AGE 56-64 HIGH RISK  Completed   Hepatitis C Screening  Completed   HIV Screening  Completed   Colorectal cancer screening: Type of screening: Colonoscopy. Completed 01/27/17. Repeat every 5 years  Mammogram- [place] RT BREAST BX W LOC DEV 1ST LESION IMG BX SPEC US GUIDE. Scheduled 08/08/20.   Bone Density- 10/13/18. Normal. Repeat every  5 years.   Lung Cancer Screening: (Low Dose CT Chest recommended if Age 62-80 years, 30 pack-year currently smoking OR have quit w/in 15years.) does not qualify.    Hepatitis C Screening: Completed 01/03/16.  Medications- Self managed. Patient mentioned missing a dose of repatha but is now back on track. Nurse read aloud how to take repatha, every 14 days. Patient verbalizes understanding.   Vision Screening: Recommended annual ophthalmology exams for early detection of glaucoma and other disorders of the eye. Is the patient up to date with their annual eye exam?  Yes  Who is the provider or what is the name of the office in which the patient attends annual eye exams? [facility]. Dr. George Ina.  Visits every 6 months.   Dental Screening: Recommended annual dental exams for proper oral hygiene.  Community Resource Referral / Chronic Care Management: CRR required this visit?  No      Plan:   Keep all routine maintenance appointments.   Next scheduled lab 08/09/20 @ 10:00  Follow up 01/10/21 @ 9:30  Request referral for podiatry, nutritionist with diabetes. Deferred to PCP for follow up. Patient seen in office 07/24/20.   I have personally reviewed and noted the following in the patients chart:    Medical and social history  Use of alcohol, tobacco or illicit drugs   Current medications and supplements  Functional ability and status  Nutritional status  Physical activity  Advanced directives  List of other physicians  Hospitalizations, surgeries, and ER visits in previous 12 months  Vitals  Screenings to include cognitive, depression, and falls  Referrals and appointments  In addition, I have reviewed and discussed with patient certain preventive protocols, quality metrics, and best practice recommendations. A written personalized care plan for preventive services as well as general preventive health recommendations were provided to patient via mychart.     Varney Biles, LPN   [id number]      Agree Dr. Olivia Mackie [name]

## 2020-08-03 NOTE — Patient Instructions (Addendum)
[name] , Thank you for taking time to come for your Medicare Wellness Visit. I appreciate your ongoing commitment to your health goals. Please review the following plan we discussed and let me know if I can assist you in the future.   These are the goals we discussed: Goals      Patient Stated   .  DIET - INCREASE WATER INTAKE (pt-stated)    .  Follow up with Primary Care Provider (pt-stated)      As needed       This is a list of the screening recommended for you and due dates:  Health Maintenance  Topic Date Due  . Complete foot exam   Never done  . COVID-19 Vaccine (3 - Pfizer risk 4-dose series) 08/09/2020*  . Hemoglobin A1C  01/22/2021  . Pap Smear  05/12/2021  . Eye exam for diabetics  05/24/2021  . Mammogram  10/17/2021  . Colon Cancer Screening  01/27/2022  . Tetanus Vaccine  05/04/2029  . Flu Shot  Completed  . Pneumococcal vaccine  Completed  .  Hepatitis C: One time screening is recommended by Center for Disease Control  (CDC) for  adults born from [birth year] through 1967.   Completed  . HIV Screening  Completed  *Topic was postponed. The date shown is not the original due date.    Immunizations Immunization History  Administered Date(s) Administered  . Influenza Split 05/22/2015, 04/21/2016  . Influenza,inj,Quad PF,6+ Mos 04/05/2018, 03/29/2019, 04/19/2020  . PFIZER SARS-COV-2 Vaccination 01/03/2020, 01/24/2020  . Tdap 05/05/2019   Keep all routine maintenance appointments.   Next scheduled lab 08/09/20 @ 10:00  Follow up 01/10/21 @ 9:30  Advanced directives: on file  Follow up in one year for your annual wellness visit.   Preventive Care 40-64 Years, Female Preventive care refers to lifestyle choices and visits with your health care provider that can promote health and wellness. What does preventive care include?  A yearly physical exam. This is also called an annual well check.  Dental exams once or twice a year.  Routine eye exams. Ask your health  care provider how often you should have your eyes checked.  Personal lifestyle choices, including:  Daily care of your teeth and gums.  Regular physical activity.  Eating a healthy diet.  Avoiding tobacco and drug use.  Limiting alcohol use.  Practicing safe sex.  Taking low-dose aspirin daily starting at age 73.  Taking vitamin and mineral supplements as recommended by your health care provider. What happens during an annual well check? The services and screenings done by your health care provider during your annual well check will depend on your age, overall health, lifestyle risk factors, and family history of disease. Counseling  Your health care provider may ask you questions about your:  Alcohol use.  Tobacco use.  Drug use.  Emotional well-being.  Home and relationship well-being.  Sexual activity.  Eating habits.  Work and work Statistician.  Method of birth control.  Menstrual cycle.  Pregnancy history. Screening  You may have the following tests or measurements:  Height, weight, and BMI.  Blood pressure.  Lipid and cholesterol levels. These may be checked every 5 years, or more frequently if you are over 69 years old.  Skin check.  Lung cancer screening. You may have this screening every year starting at age 18 if you have a 30-pack-year history of smoking and currently smoke or have quit within the past 15 years.  Fecal occult blood  test (FOBT) of the stool. You may have this test every year starting at age 72.  Flexible sigmoidoscopy or colonoscopy. You may have a sigmoidoscopy every 5 years or a colonoscopy every 10 years starting at age 64.  Hepatitis C blood test.  Hepatitis B blood test.  Sexually transmitted disease (STD) testing.  Diabetes screening. This is done by checking your blood sugar (glucose) after you have not eaten for a while (fasting). You may have this done every 1-3 years.  Mammogram. This may be done every 1-2  years. Talk to your health care provider about when you should start having regular mammograms. This may depend on whether you have a family history of breast cancer.  BRCA-related cancer screening. This may be done if you have a family history of breast, ovarian, tubal, or peritoneal cancers.  Pelvic exam and Pap test. This may be done every 3 years starting at age 90. Starting at age 65, this may be done every 5 years if you have a Pap test in combination with an HPV test.  Bone density scan. This is done to screen for osteoporosis. You may have this scan if you are at high risk for osteoporosis. Discuss your test results, treatment options, and if necessary, the need for more tests with your health care provider. Vaccines  Your health care provider may recommend certain vaccines, such as:  Influenza vaccine. This is recommended every year.  Tetanus, diphtheria, and acellular pertussis (Tdap, Td) vaccine. You may need a Td booster every 10 years.  Zoster vaccine. You may need this after age 74.  Pneumococcal 13-valent conjugate (PCV13) vaccine. You may need this if you have certain conditions and were not previously vaccinated.  Pneumococcal polysaccharide (PPSV23) vaccine. You may need one or two doses if you smoke cigarettes or if you have certain conditions. Talk to your health care provider about which screenings and vaccines you need and how often you need them. This information is not intended to replace advice given to you by your health care provider. Make sure you discuss any questions you have with your health care provider. Document Released: 08/10/2015 Document Revised: 04/02/2016 Document Reviewed: 05/15/2015 Elsevier Interactive Patient Education  2017 [employer] Prevention in the Home Falls can cause injuries. They can happen to people of all ages. There are many things you can do to make your home safe and to help prevent falls. What can I do on the outside  of my home?  Regularly fix the edges of walkways and driveways and fix any cracks.  Remove anything that might make you trip as you walk through a door, such as a raised step or threshold.  Trim any bushes or trees on the path to your home.  Use bright outdoor lighting.  Clear any walking paths of anything that might make someone trip, such as rocks or tools.  Regularly check to see if handrails are loose or broken. Make sure that both sides of any steps have handrails.  Any raised decks and porches should have guardrails on the edges.  Have any leaves, snow, or ice cleared regularly.  Use sand or salt on walking paths during winter.  Clean up any spills in your garage right away. This includes oil or grease spills. What can I do in the bathroom?  Use night lights.  Install grab bars by the toilet and in the tub and shower. Do not use towel bars as grab bars.  Use non-skid mats  or decals in the tub or shower.  If you need to sit down in the shower, use a plastic, non-slip stool.  Keep the floor dry. Clean up any water that spills on the floor as soon as it happens.  Remove soap buildup in the tub or shower regularly.  Attach bath mats securely with double-sided non-slip rug tape.  Do not have throw rugs and other things on the floor that can make you trip. What can I do in the bedroom?  Use night lights.  Make sure that you have a light by your bed that is easy to reach.  Do not use any sheets or blankets that are too big for your bed. They should not hang down onto the floor.  Have a firm chair that has side arms. You can use this for support while you get dressed.  Do not have throw rugs and other things on the floor that can make you trip. What can I do in the kitchen?  Clean up any spills right away.  Avoid walking on wet floors.  Keep items that you use a lot in easy-to-reach places.  If you need to reach something above you, use a strong step stool that  has a grab bar.  Keep electrical cords out of the way.  Do not use floor polish or wax that makes floors slippery. If you must use wax, use non-skid floor wax.  Do not have throw rugs and other things on the floor that can make you trip. What can I do with my stairs?  Do not leave any items on the stairs.  Make sure that there are handrails on both sides of the stairs and use them. Fix handrails that are broken or loose. Make sure that handrails are as long as the stairways.  Check any carpeting to make sure that it is firmly attached to the stairs. Fix any carpet that is loose or worn.  Avoid having throw rugs at the top or bottom of the stairs. If you do have throw rugs, attach them to the floor with carpet tape.  Make sure that you have a light switch at the top of the stairs and the bottom of the stairs. If you do not have them, ask someone to add them for you. What else can I do to help prevent falls?  Wear shoes that:  Do not have high heels.  Have rubber bottoms.  Are comfortable and fit you well.  Are closed at the toe. Do not wear sandals.  If you use a stepladder:  Make sure that it is fully opened. Do not climb a closed stepladder.  Make sure that both sides of the stepladder are locked into place.  Ask someone to hold it for you, if possible.  Clearly mark and make sure that you can see:  Any grab bars or handrails.  First and last steps.  Where the edge of each step is.  Use tools that help you move around (mobility aids) if they are needed. These include:  Canes.  Walkers.  Scooters.  Crutches.  Turn on the lights when you go into a dark area. Replace any light bulbs as soon as they burn out.  Set up your furniture so you have a clear path. Avoid moving your furniture around.  If any of your floors are uneven, fix them.  If there are any pets around you, be aware of where they are.  Review your medicines with your doctor. Some  medicines  can make you feel dizzy. This can increase your chance of falling. Ask your doctor what other things that you can do to help prevent falls. This information is not intended to replace advice given to you by your health care provider. Make sure you discuss any questions you have with your health care provider. Document Released: 05/10/2009 Document Revised: 12/20/2015 Document Reviewed: 08/18/2014 Elsevier Interactive Patient Education  2017 [employer].

## 2020-08-08 ENCOUNTER — Ambulatory Visit
Admission: RE | Admit: 2020-08-08 | Discharge: 2020-08-08 | Disposition: A | Discharge status: Home / Self Care | Payer: Medicare HMO | Source: Ambulatory Visit | Attending: *Deleted | Admitting: *Deleted

## 2020-08-08 ENCOUNTER — Other Ambulatory Visit: Payer: Self-pay | Admitting: General Surgery

## 2020-08-08 ENCOUNTER — Ambulatory Visit
Admission: RE | Admit: 2020-08-08 | Discharge: 2020-08-08 | Disposition: A | Discharge status: Home / Self Care | Payer: PRIVATE HEALTH INSURANCE | Source: Ambulatory Visit | Attending: Cardiovascular Disease | Admitting: Cardiovascular Disease

## 2020-08-08 ENCOUNTER — Ambulatory Visit
Admission: RE | Admit: 2020-08-08 | Discharge: 2020-08-08 | Disposition: A | Discharge status: Home / Self Care | Payer: PRIVATE HEALTH INSURANCE | Source: Ambulatory Visit | Attending: General Surgery | Admitting: General Surgery

## 2020-08-08 ENCOUNTER — Telehealth: Payer: Self-pay | Admitting: *Deleted

## 2020-08-08 ENCOUNTER — Other Ambulatory Visit: Payer: Self-pay | Admitting: Cardiovascular Disease

## 2020-08-08 ENCOUNTER — Other Ambulatory Visit: Payer: Self-pay

## 2020-08-08 DIAGNOSIS — I2510 Atherosclerotic heart disease of native coronary artery without angina pectoris: Secondary | ICD-10-CM

## 2020-08-08 DIAGNOSIS — N63 Unspecified lump in unspecified breast: Secondary | ICD-10-CM

## 2020-08-08 DIAGNOSIS — I70219 Atherosclerosis of native arteries of extremities with intermittent claudication, unspecified extremity: Secondary | ICD-10-CM

## 2020-08-08 DIAGNOSIS — N6001 Solitary cyst of right breast: Secondary | ICD-10-CM | POA: Diagnosis not present

## 2020-08-08 NOTE — Telephone Encounter (Signed)
PA required for Repatha 140 mg/ml. PA has been submitted via Covermymeds. Awaiting Approval.

## 2020-08-09 ENCOUNTER — Other Ambulatory Visit: Payer: Medicare HMO

## 2020-08-09 NOTE — Telephone Encounter (Signed)
As a member of [employer] ([employer]), we are pleased to inform you that,  upon review of the information provided by you or your doctor, we have approved the requested  coverage for the following prescription drug(s): REPATHA SURECLICK Soln Auto-inj Type of coverage approved: Non-Formulary  This approval authorizes your coverage from 07/28/2020 - 07/27/2021

## 2020-08-09 NOTE — Addendum Note (Signed)
Addended by: Orland Mustard on: 08/09/2020 04:18 PM   Modules accepted: Orders

## 2020-08-10 ENCOUNTER — Telehealth: Payer: Self-pay

## 2020-08-10 ENCOUNTER — Other Ambulatory Visit: Payer: PRIVATE HEALTH INSURANCE

## 2020-08-10 NOTE — Telephone Encounter (Signed)
[employer] approved pt's assistance for Repatha Sureclick Soln Auto inj pen from 07/28/2020-12/31-2022. Fax filed in [facility]

## 2020-08-14 ENCOUNTER — Ambulatory Visit: Payer: Medicare HMO | Admitting: Podiatry

## 2020-08-14 DIAGNOSIS — G4710 Hypersomnia, unspecified: Secondary | ICD-10-CM | POA: Diagnosis not present

## 2020-08-14 DIAGNOSIS — I259 Chronic ischemic heart disease, unspecified: Secondary | ICD-10-CM | POA: Diagnosis not present

## 2020-08-14 DIAGNOSIS — G4733 Obstructive sleep apnea (adult) (pediatric): Secondary | ICD-10-CM | POA: Diagnosis not present

## 2020-08-15 DIAGNOSIS — G4733 Obstructive sleep apnea (adult) (pediatric): Secondary | ICD-10-CM | POA: Diagnosis not present

## 2020-08-15 DIAGNOSIS — G4710 Hypersomnia, unspecified: Secondary | ICD-10-CM | POA: Diagnosis not present

## 2020-08-15 DIAGNOSIS — I259 Chronic ischemic heart disease, unspecified: Secondary | ICD-10-CM | POA: Diagnosis not present

## 2020-08-17 ENCOUNTER — Telehealth: Payer: Self-pay | Admitting: Internal Medicine

## 2020-08-17 ENCOUNTER — Telehealth: Payer: PRIVATE HEALTH INSURANCE | Admitting: Internal Medicine

## 2020-08-17 NOTE — Telephone Encounter (Signed)
Left message to return call. Patient will need to reschedule appointment

## 2020-08-17 NOTE — Telephone Encounter (Signed)
Left message to return call. To start Patient's video visit

## 2020-08-19 NOTE — Progress Notes (Signed)
Can we call patient to follow up on her blood pressure PMD reports was low on their visit, losartan cut in 1/2 by their office at that time Thx TG

## 2020-08-20 ENCOUNTER — Telehealth: Payer: Self-pay

## 2020-08-20 NOTE — Telephone Encounter (Signed)
Reached out to pt regarding her BP, at her PCP her BP was low 100/58, pt's Losartan was reduce in half. Pt reports has not been regularly checking her BP at home, seen at [facility] on 1/7 and pt reports BP systolic running 72-094. Pt took her BP while on the phone, left arm 145/92, given that pt was up moving around, pt reports has been "at it all morning I haven't stopped". Pt denies CP, shob, or dizziness, only concern is "knots" that are now on her right arm and leg, is seeing PCP about them. Advised pt to take her BP daily after BP meds and chart them down on a piece of paper. Was able to schedule pt for a 12 m f/u on Feb 22 at 09:40am with Dr. [name]. Pt will bring in her BP log then.

## 2020-08-21 ENCOUNTER — Encounter: Payer: Self-pay | Admitting: Podiatry

## 2020-08-21 ENCOUNTER — Telehealth (INDEPENDENT_AMBULATORY_CARE_PROVIDER_SITE_OTHER): Payer: Medicare HMO | Admitting: Internal Medicine

## 2020-08-21 ENCOUNTER — Ambulatory Visit (INDEPENDENT_AMBULATORY_CARE_PROVIDER_SITE_OTHER): Payer: Medicare HMO | Admitting: Podiatry

## 2020-08-21 ENCOUNTER — Other Ambulatory Visit: Payer: Self-pay

## 2020-08-21 ENCOUNTER — Encounter: Payer: Self-pay | Admitting: Internal Medicine

## 2020-08-21 ENCOUNTER — Other Ambulatory Visit (INDEPENDENT_AMBULATORY_CARE_PROVIDER_SITE_OTHER): Payer: Medicare HMO

## 2020-08-21 VITALS — Ht 65.0 in | Wt 220.6 lb

## 2020-08-21 DIAGNOSIS — R7989 Other specified abnormal findings of blood chemistry: Secondary | ICD-10-CM | POA: Diagnosis not present

## 2020-08-21 DIAGNOSIS — L603 Nail dystrophy: Secondary | ICD-10-CM | POA: Diagnosis not present

## 2020-08-21 DIAGNOSIS — B373 Candidiasis of vulva and vagina: Secondary | ICD-10-CM

## 2020-08-21 DIAGNOSIS — L02414 Cutaneous abscess of left upper limb: Secondary | ICD-10-CM | POA: Diagnosis not present

## 2020-08-21 DIAGNOSIS — Z22322 Carrier or suspected carrier of Methicillin resistant Staphylococcus aureus: Secondary | ICD-10-CM | POA: Diagnosis not present

## 2020-08-21 DIAGNOSIS — D696 Thrombocytopenia, unspecified: Secondary | ICD-10-CM | POA: Diagnosis not present

## 2020-08-21 DIAGNOSIS — R2241 Localized swelling, mass and lump, right lower limb: Secondary | ICD-10-CM

## 2020-08-21 DIAGNOSIS — B3731 Acute candidiasis of vulva and vagina: Secondary | ICD-10-CM

## 2020-08-21 DIAGNOSIS — J324 Chronic pansinusitis: Secondary | ICD-10-CM

## 2020-08-21 DIAGNOSIS — D1723 Benign lipomatous neoplasm of skin and subcutaneous tissue of right leg: Secondary | ICD-10-CM

## 2020-08-21 MED ORDER — MUPIROCIN 2 % EX OINT: 1.0000 "application " | TOPICAL_OINTMENT | Freq: Two times a day (BID) | CUTANEOUS | 0 refills | Status: DC

## 2020-08-21 MED ORDER — FLUCONAZOLE 150 MG PO TABS
150.0000 mg | ORAL_TABLET | Freq: Once | ORAL | 0 refills | Status: AC
Start: 2020-08-21 — End: 2020-08-21

## 2020-08-21 MED ORDER — DOXYCYCLINE HYCLATE 100 MG PO TABS: 100.0000 mg | ORAL_TABLET | Freq: Two times a day (BID) | ORAL | 0 refills | Status: DC

## 2020-08-21 MED ORDER — SALINE SPRAY 0.65 % NA SOLN: 2.0000 | Freq: Every day | NASAL | 11 refills | Status: DC | PRN

## 2020-08-21 NOTE — Progress Notes (Signed)
Subjective:  Patient ID: [name], female    DOB: 1960/10/27,  MRN: [id number]  Chief Complaint  Patient presents with  . Nail Problem    Patient presents today for possible ingrown toenail left hallux both borders x 2-3 weeks    60 y.o. female presents with the above complaint. Patient presents with complaint of pain across the entire nail.  Patient states that mildly microtrauma involved but has been causing her a lot of pain.  She states that she went to the nail salon that may have dug in there and leading to ingrowing of the nail.  She says been over 2 to 3 weeks has progressive gotten worse.  She would like to discuss taking the whole nail off.  She denies any other acute complaints.   She would like to discuss treatment options for this.  She has not seen anyone else prior to seeing them.  She is a diabeticWith his last A1c of 6.5.   Review of Systems: Negative except as noted in the HPI. Denies N/V/F/Ch.  Past Medical History:  Diagnosis Date  . Allergy   . Anemia   . Arthritis   . Asthma   . Bacterial vaginitis   . Blood in stool   . Brachial neuritis or radiculitis NOS   . 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 being followed by neurosurgery/eye doctor and pcp. monitoring size.causes sinus problems  . Bronchitis   . Cardiac arrhythmia   . Cervical neck pain with evidence of disc disease    C5/6 disease, MRI done late 2012 - no records available  . Chronic combined systolic and diastolic CHF (congestive heart failure) ([place])    a. 08/2010 Echo: mildly reduced EF 40-45%, mild diffuse hypokinesis; b. 06/2016 Echo: EF 50%, no rwma, mild to mod TR; c. 03/2017 Echo: EF 40-45%, Gr1 DD (prior echo reviewed and EF felt to be lower than reported).  . Chronic pain   . Cocaine abuse, in remission ([place])    clean x 24 years  . Colon polyps   . COPD (chronic obstructive pulmonary disease) ([place])    a. 12/2018 PFT: No obvious obst/restrictive dzs.  . Coronary artery  disease    a. PCI of LCX 2003; b. PCI of the LAD 2012 with a (2.5 x 8 mm BMS);  c.s/p CABG 4/12: L-LAD, VG-Dx, VG-OM, VG-RCA (Dr. Prescott Gum);  d. 01/2012 MV: inf infarct, attenuation, no ischemia; e. 02/2017 MV: signifi attenuation artifact. Fixed basal antlat/inflat scar vs artifact. Reversible apical lat and mid antlat defect - ? atten vs ischemia. F/u echo w/o wma->Med rx.  . Depression   . Diabetes mellitus without complication ([place])   . Family history of colon cancer   . Generalized headaches    frequent  . GERD (gastroesophageal reflux disease)   . Headache   . Heart murmur   . Hematochezia   . Hepatitis    history of hepatitis b  . History of cervical cancer    s/p cryotherapy  . History of drug abuse ([place])    cocaine, marijuana, clean since 1989  . History of hepatitis B    from eating undercooked liver  . History of MI (myocardial infarction)   . Hyperlipidemia   . Hypertension   . Ischemic cardiomyopathy    a. 03/2017 Echo: EF 40-45%, Gr1 DD.  [name] Myocardial infarction [facility]) 2003, 2012  . PAD (peripheral artery disease) ([facility])    a. s/p Right SFA atherectomy and PTA  01/15/11; b. 07/2018 ABI: R 1.02, L 1.09.  [name] Pituitary mass ([place])    a. 12/2018 MRI Brain: Stable pituitary mass w/ 39mm area of necrosis. Mass abuts R cavernous sinus w/o definite invasion.  . Polyp of colon   . Seasonal allergies   . Sleep apnea    uses cpap  . Smoking history    quit 07/2010  . Thyroid disease   . Urinary incontinence     Current Outpatient Medications:  .  albuterol (VENTOLIN HFA) 108 (90 Base) MCG/ACT inhaler, Inhale 2 puffs into the lungs every 6 (six) hours as needed for wheezing or shortness of breath., Disp: 1 g, Rfl: 12 .  aspirin EC 81 MG tablet, Take 1 tablet (81 mg total) by mouth daily., Disp: 90 tablet, Rfl: 3 .  Azelastine HCl 0.15 % SOLN, Place 2 sprays into both nostrils daily as needed (allergies). , Disp: , Rfl:  .  cyclobenzaprine (FLEXERIL) 5 MG tablet, Take 1-2 tablets  (5-10 mg total) by mouth at bedtime as needed for muscle spasms., Disp: 60 tablet, Rfl: 11 .  dicyclomine (BENTYL) 10 MG capsule, Take 1 capsule (10 mg total) by mouth 3 (three) times daily before meals. As needed for abdominal pain (Patient not taking: Reported on 08/21/2020), Disp: 120 capsule, Rfl: 11 .  doxycycline (VIBRA-TABS) 100 MG tablet, Take 1 tablet (100 mg total) by mouth 2 (two) times daily. With food, Disp: 20 tablet, Rfl: 0 .  esomeprazole (NEXIUM) 40 MG capsule, Take 1 capsule (40 mg total) by mouth daily. Reported on 09/04/2015, Disp: 90 capsule, Rfl: 3 .  ezetimibe (ZETIA) 10 MG tablet, Take 1 tablet by mouth once daily, Disp: 90 tablet, Rfl: 3 .  fluconazole (DIFLUCAN) 150 MG tablet, Take 1 tablet (150 mg total) by mouth once for 1 dose., Disp: 1 tablet, Rfl: 0 .  fluticasone (FLONASE) 50 MCG/ACT nasal spray, Place 2 sprays into both nostrils daily as needed for allergies. Max dose 2 sprays each nostril, Disp: 16 g, Rfl: 11 .  Fluticasone-Umeclidin-Vilant (TRELEGY ELLIPTA) 100-62.5-25 MCG/INH AEPB, Inhale 1 puff into the lungs daily., Disp: 28 each, Rfl: 5 .  furosemide (LASIX) 20 MG tablet, TAKE 1 TABLET BY MOUTH ONCE DAILY AND A SECOND TABLET IF NEEDED FOR FLUID BUILD UP, Disp: 180 tablet, Rfl: 2 .  glucose blood (ONE TOUCH ULTRA TEST) test strip, Use as instructed check cbg qd E11.9, Disp: 100 each, Rfl: 12 .  hydrOXYzine (ATARAX/VISTARIL) 25 MG tablet, Take 1-2 tablets (25-50 mg total) by mouth daily as needed., Disp: 60 tablet, Rfl: 2 .  ipratropium-albuterol (DUONEB) 0.5-2.5 (3) MG/3ML SOLN, Take 3 mLs by nebulization 2 (two) times daily as needed (wheezing/shortness of breath)., Disp: 360 mL, Rfl: 11 .  isosorbide mononitrate (IMDUR) 30 MG 24 hr tablet, Take 1 tablet by mouth twice daily, Disp: 180 tablet, Rfl: 1 .  Lancets MISC, 1 Device by Does not apply route daily. Lancets E11.9, Disp: 90 each, Rfl: 3 .  levocetirizine (XYZAL) 5 MG tablet, Take 1 tablet (5 mg total) by mouth  at bedtime as needed for allergies., Disp: 90 tablet, Rfl: 3 .  linaclotide (LINZESS) 72 MCG capsule, Take 1 capsule (72 mcg total) by mouth daily before breakfast., Disp: 90 capsule, Rfl: 3 .  losartan (COZAAR) 25 MG tablet, Take 0.5 tablets (12.5 mg total) by mouth daily., Disp: 90 tablet, Rfl: 3 .  meclizine (ANTIVERT) 25 MG tablet, Take 1 tablet (25 mg total) by mouth 2 (two) times daily as needed  for dizziness., Disp: 60 tablet, Rfl: 5 .  metFORMIN (GLUCOPHAGE XR) 500 MG 24 hr tablet, Take 1 tablet (500 mg total) by mouth daily with breakfast. (Patient not taking: Reported on 08/21/2020), Disp: 90 tablet, Rfl: 1 .  metoprolol succinate (TOPROL-XL) 50 MG 24 hr tablet, Take 1 tablet (50 mg total) by mouth daily. Take with or immediately following a meal., Disp: 90 tablet, Rfl: 3 .  mirabegron ER (MYRBETRIQ) 25 MG TB24 tablet, Take 1 tablet (25 mg total) by mouth daily., Disp: 30 tablet, Rfl: 11 .  montelukast (SINGULAIR) 10 MG tablet, Take 1 tablet (10 mg total) by mouth at bedtime., Disp: 90 tablet, Rfl: 3 .  mupirocin ointment (BACTROBAN) 2 %, Apply 1 application topically 2 (two) times daily. Both nostrils (nose) x 5 days, Disp: 30 g, Rfl: 0 .  nitroGLYCERIN (NITROSTAT) 0.4 MG SL tablet, Place 1 tablet (0.4 mg total) under the tongue every 5 (five) minutes as needed., Disp: 25 tablet, Rfl: 6 .  ondansetron (ZOFRAN) 4 MG tablet, Take 1 tablet (4 mg total) by mouth every 8 (eight) hours as needed for nausea or vomiting., Disp: 20 tablet, Rfl: 0 .  PARoxetine (PAXIL) 20 MG tablet, Take 1 tablet (20 mg total) by mouth daily., Disp: 90 tablet, Rfl: 3 .  potassium chloride (KLOR-CON) 10 MEQ tablet, Take 1 tablet (10 mEq total) by mouth daily., Disp: 90 tablet, Rfl: 3 .  pregabalin (LYRICA) 75 MG capsule, Take by mouth., Disp: , Rfl:  .  REPATHA SURECLICK 161 MG/ML SOAJ, INJECT 1 PEN INTO THE SKIN EVERY 14 (FOURTEEN) DAYS, Disp: 2 mL, Rfl: 2 .  rosuvastatin (CRESTOR) 20 MG tablet, Take 1 tablet (20 mg  total) by mouth at bedtime., Disp: 90 tablet, Rfl: 3 .  sodium chloride (OCEAN) 0.65 % SOLN nasal spray, Place 2 sprays into both nostrils daily as needed for congestion., Disp: 30 mL, Rfl: 11 .  traZODone (DESYREL) 50 MG tablet, Take 0.5-1 tablets (25-50 mg total) by mouth at bedtime as needed for sleep., Disp: 90 tablet, Rfl: 3 .  XARELTO 2.5 MG TABS tablet, Take 1 tablet by mouth twice daily, Disp: 180 tablet, Rfl: 1  Social History   Tobacco Use  Smoking Status Former Smoker  . Packs/day: 1.00  . Years: 38.00  . Pack years: 38.00  . Types: Cigarettes  . Quit date: 08/12/2010  . Years since quitting: 10.0  Smokeless Tobacco Never Used    Allergies  Allergen Reactions  . Latex Rash       . Shellfish Allergy Anaphylaxis    Hard shellfish/swelling in throat  . Sulfonamide Derivatives Anaphylaxis    Swelling in throat.  . Watermelon [Citrullus Vulgaris] Other (See Comments)    Throat itchy  . Soy Allergy     Diarrhea/upset stomach    . Other Swelling   Objective:  There were no vitals filed for this visit. There is no height or weight on file to calculate BMI. Constitutional Well developed. Well nourished.  Vascular Dorsalis pedis pulses palpable bilaterally. Posterior tibial pulses palpable bilaterally. Capillary refill normal to all digits.  No cyanosis or clubbing noted. Pedal hair growth normal.  Neurologic Normal speech. Oriented to person, place, and time. Epicritic sensation to light touch grossly present bilaterally.  Dermatologic Pain on palpation of the entire/total nail on 1st digit of the left No other open wounds. No skin lesions.  Orthopedic: Normal joint ROM without pain or crepitus bilaterally. No visible deformities. No bony tenderness.   Radiographs:  None Assessment:   1. Nail dystrophy    Plan:  Patient was evaluated and treated and all questions answered.  Nail contusion/dystrophy hallux, left -Patient elects to proceed with minor  surgery to remove entire toenail today. Consent reviewed and signed by patient. -Entire/total nail excised. See procedure note. -Educated on post-procedure care including soaking. Written instructions provided and reviewed. -Patient to follow up in 2 weeks for nail check.  Procedure: Excision of entire/total nail  Location: Left 1st toe digit Anesthesia: Lidocaine 1% plain; 1.5 mL and Marcaine 0.5% plain; 1.5 mL, digital block. Skin Prep: Betadine. Dressing: Silvadene; telfa; dry, sterile, compression dressing. Technique: Following skin prep, the toe was exsanguinated and a tourniquet was secured at the base of the toe. The affected nail border was freed and excised. The tourniquet was then removed and sterile dressing applied. Disposition: Patient tolerated procedure well. Patient to return in 2 weeks for follow-up.   No follow-ups on file.

## 2020-08-21 NOTE — Patient Instructions (Signed)
Soak Instructions    THE DAY AFTER THE PROCEDURE  Place 1/4 cup of epsom salts or Betadine in a quart of warm tap water.  Submerge your foot or feet with outer bandage intact for the initial soak; this will allow the bandage to become moist and wet for easy lift off.  Once you remove your bandage, continue to soak in the solution for 20 minutes.  This soak should be done twice a day.  Next, remove your foot or feet from solution, blot dry the affected area and cover.  You may use a band aid large enough to cover the area or use gauze and tape.  Apply other medications to the area as directed by the doctor such as polysporin neosporin.  IF YOUR SKIN BECOMES IRRITATED WHILE USING THESE INSTRUCTIONS, IT IS OKAY TO SWITCH TO  WHITE VINEGAR AND WATER. Or you may use antibacterial soap and water to keep the toe clean  Monitor for any signs/symptoms of infection. Call the office immediately if any occur or go directly to the emergency room. Call with any questions/concerns.    Long Term Care Instructions-Post Nail Surgery  You have had your ingrown toenail and root treated with a chemical.  This chemical causes a burn that will drain and ooze like a blister.  This can drain for 6-8 weeks or longer.  It is important to keep this area clean, covered, and follow the soaking instructions dispensed at the time of your surgery.  This area will eventually dry and form a scab.  Once the scab forms you no longer need to soak or apply a dressing.  If at any time you experience an increase in pain, redness, swelling, or drainage, you should contact the office as soon as possible.  

## 2020-08-21 NOTE — Progress Notes (Addendum)
Virtual Visit via Video Note  I connected with [name] on 08/21/20 at  9:20 AM EST by a video enabled telemedicine application and verified that I am speaking with the correct person using two identifiers.  Location patient: home, [place] Location provider:work or home office Persons participating in the virtual visit: patient, provider pts sister [name] and [name]  I discussed the limitations of evaluation and management by telemedicine and the availability of in person appointments. The patient expressed understanding and agreed to proceed.   HPI: 1. Mass left arm x 2 weeks hot, swollen not a bite not draining but sore tried warm/cold compresses w/o relief and also mass posterior inner right thigh not painful but notices the other day No one else in the house has masses   ROS: See pertinent positives and negatives per HPI.  Past Medical History:  Diagnosis Date  . Allergy   . Anemia   . Arthritis   . Asthma   . Bacterial vaginitis   . Blood in stool   . Brachial neuritis or radiculitis NOS   . 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 being followed by neurosurgery/eye doctor and pcp. monitoring size.causes sinus problems  . Bronchitis   . Cardiac arrhythmia   . Cervical neck pain with evidence of disc disease    C5/6 disease, MRI done late 2012 - no records available  . Chronic combined systolic and diastolic CHF (congestive heart failure) ([place])    a. 08/2010 Echo: mildly reduced EF 40-45%, mild diffuse hypokinesis; b. 06/2016 Echo: EF 50%, no rwma, mild to mod TR; c. 03/2017 Echo: EF 40-45%, Gr1 DD (prior echo reviewed and EF felt to be lower than reported).  . Chronic pain   . Cocaine abuse, in remission ([place])    clean x 24 years  . Colon polyps   . COPD (chronic obstructive pulmonary disease) ([place])    a. 12/2018 PFT: No obvious obst/restrictive dzs.  . Coronary artery disease    a. PCI of LCX 2003; b. PCI of the LAD 2012 with a (2.5 x 8 mm BMS);  c.s/p CABG 4/12:  L-LAD, VG-Dx, VG-OM, VG-RCA (Dr. Prescott Gum);  d. 01/2012 MV: inf infarct, attenuation, no ischemia; e. 02/2017 MV: signifi attenuation artifact. Fixed basal antlat/inflat scar vs artifact. Reversible apical lat and mid antlat defect - ? atten vs ischemia. F/u echo w/o wma->Med rx.  . Depression   . Diabetes mellitus without complication ([place])   . Family history of colon cancer   . Generalized headaches    frequent  . GERD (gastroesophageal reflux disease)   . Headache   . Heart murmur   . Hematochezia   . Hepatitis    history of hepatitis b  . History of cervical cancer    s/p cryotherapy  . History of drug abuse ([place])    cocaine, marijuana, clean since 1989  . History of hepatitis B    from eating undercooked liver  . History of MI (myocardial infarction)   . Hyperlipidemia   . Hypertension   . Ischemic cardiomyopathy    a. 03/2017 Echo: EF 40-45%, Gr1 DD.  [name] Myocardial infarction [facility]) 2003, 2012  . PAD (peripheral artery disease) ([place])    a. s/p Right SFA atherectomy and PTA 01/15/11; b. 07/2018 ABI: R 1.02, L 1.09.  [name] Pituitary mass ([place])    a. 12/2018 MRI Brain: Stable pituitary mass w/ 64mm area of necrosis. Mass abuts R cavernous sinus w/o definite invasion.  [name]  Polyp of colon   . Seasonal allergies   . Sleep apnea    uses cpap  . Smoking history    quit 07/2010  . Thyroid disease   . Urinary incontinence     Past Surgical History:  Procedure Laterality Date  . ABDOMINAL HYSTERECTOMY     2003  . CHOLECYSTECTOMY  1986  . COLONOSCOPY  2008   3 polyps  . COLONOSCOPY WITH PROPOFOL N/A 02/06/2015   Procedure: COLONOSCOPY WITH PROPOFOL;  Surgeon: Lucilla Lame, MD;  Location: [facility] ENDOSCOPY;  Service: Endoscopy;  Laterality: N/A;  . COLONOSCOPY WITH PROPOFOL N/A 01/27/2017   Procedure: COLONOSCOPY WITH PROPOFOL;  Surgeon: Lucilla Lame, MD;  Location: [facility] ENDOSCOPY;  Service: Endoscopy;  Laterality: N/A;  . CORONARY ANGIOPLASTY     w/ stent placement x2  . CORONARY ARTERY BYPASS  GRAFT  2012   Dr [name]  . CORONARY STENT PLACEMENT  2003   S/P MI  . CORONARY STENT PLACEMENT  2007   [place]  . ESOPHAGOGASTRODUODENOSCOPY (EGD) WITH PROPOFOL N/A 02/06/2015   Procedure: ESOPHAGOGASTRODUODENOSCOPY (EGD) WITH PROPOFOL;  Surgeon: Lucilla Lame, MD;  Location: [facility] ENDOSCOPY;  Service: Endoscopy;  Laterality: N/A;  . ESOPHAGOGASTRODUODENOSCOPY (EGD) WITH PROPOFOL N/A 01/27/2017   Procedure: ESOPHAGOGASTRODUODENOSCOPY (EGD) WITH PROPOFOL;  Surgeon: Lucilla Lame, MD;  Location: [facility] ENDOSCOPY;  Service: Endoscopy;  Laterality: N/A;  . ESOPHAGOGASTRODUODENOSCOPY (EGD) WITH PROPOFOL N/A 09/01/2019   Procedure: ESOPHAGOGASTRODUODENOSCOPY (EGD) WITH PROPOFOL;  Surgeon: Lin Landsman, MD;  Location: [facility] ENDOSCOPY;  Service: Gastroenterology;  Laterality: N/A;  . FEMORAL ARTERY STENT  10/2010   right sided (Dr. Burt Knack)  . KNEE ARTHROSCOPY WITH MEDIAL MENISECTOMY Right 08/20/2017   Procedure: KNEE ARTHROSCOPY WITH MEDIAL  AND LATERAL MENISECTOMY;  Surgeon: Hessie Knows, MD;  Location: [facility] ORS;  Service: Orthopedics;  Laterality: Right;  . POSTERIOR CERVICAL LAMINECTOMY N/A 03/08/2018   Procedure: POSTERIOR CERVICAL LAMINECTOMY-C7;  Surgeon: Deetta Perla, MD;  Location: [facility] ORS;  Service: Neurosurgery;  Laterality: N/A;  . TONSILLECTOMY    . TOTAL KNEE ARTHROPLASTY Right 04/14/2019   Procedure: RIGHT TOTAL KNEE ARTHROPLASTY;  Surgeon: Hessie Knows, MD;  Location: [facility] ORS;  Service: Orthopedics;  Laterality: Right;  . TUBAL LIGATION       Current Outpatient Medications:  .  albuterol (VENTOLIN HFA) 108 (90 Base) MCG/ACT inhaler, Inhale 2 puffs into the lungs every 6 (six) hours as needed for wheezing or shortness of breath., Disp: 1 g, Rfl: 12 .  aspirin EC 81 MG tablet, Take 1 tablet (81 mg total) by mouth daily., Disp: 90 tablet, Rfl: 3 .  Azelastine HCl 0.15 % SOLN, Place 2 sprays into both nostrils daily as needed (allergies). , Disp: , Rfl:  .  cyclobenzaprine (FLEXERIL) 5 MG  tablet, Take 1-2 tablets (5-10 mg total) by mouth at bedtime as needed for muscle spasms., Disp: 60 tablet, Rfl: 11 .  doxycycline (VIBRA-TABS) 100 MG tablet, Take 1 tablet (100 mg total) by mouth 2 (two) times daily. With food, Disp: 20 tablet, Rfl: 0 .  esomeprazole (NEXIUM) 40 MG capsule, Take 1 capsule (40 mg total) by mouth daily. Reported on 09/04/2015, Disp: 90 capsule, Rfl: 3 .  ezetimibe (ZETIA) 10 MG tablet, Take 1 tablet by mouth once daily, Disp: 90 tablet, Rfl: 3 .  fluconazole (DIFLUCAN) 150 MG tablet, Take 1 tablet (150 mg total) by mouth once for 1 dose., Disp: 1 tablet, Rfl: 0 .  fluticasone (FLONASE) 50 MCG/ACT nasal spray, Place 2 sprays into both nostrils daily as  needed for allergies. Max dose 2 sprays each nostril, Disp: 16 g, Rfl: 11 .  Fluticasone-Umeclidin-Vilant (TRELEGY ELLIPTA) 100-62.5-25 MCG/INH AEPB, Inhale 1 puff into the lungs daily., Disp: 28 each, Rfl: 5 .  furosemide (LASIX) 20 MG tablet, TAKE 1 TABLET BY MOUTH ONCE DAILY AND A SECOND TABLET IF NEEDED FOR FLUID BUILD UP, Disp: 180 tablet, Rfl: 2 .  glucose blood (ONE TOUCH ULTRA TEST) test strip, Use as instructed check cbg qd E11.9, Disp: 100 each, Rfl: 12 .  hydrOXYzine (ATARAX/VISTARIL) 25 MG tablet, Take 1-2 tablets (25-50 mg total) by mouth daily as needed., Disp: 60 tablet, Rfl: 2 .  ipratropium-albuterol (DUONEB) 0.5-2.5 (3) MG/3ML SOLN, Take 3 mLs by nebulization 2 (two) times daily as needed (wheezing/shortness of breath)., Disp: 360 mL, Rfl: 11 .  isosorbide mononitrate (IMDUR) 30 MG 24 hr tablet, Take 1 tablet by mouth twice daily, Disp: 180 tablet, Rfl: 1 .  Lancets MISC, 1 Device by Does not apply route daily. Lancets E11.9, Disp: 90 each, Rfl: 3 .  levocetirizine (XYZAL) 5 MG tablet, Take 1 tablet (5 mg total) by mouth at bedtime as needed for allergies., Disp: 90 tablet, Rfl: 3 .  linaclotide (LINZESS) 72 MCG capsule, Take 1 capsule (72 mcg total) by mouth daily before breakfast., Disp: 90 capsule, Rfl:  3 .  losartan (COZAAR) 25 MG tablet, Take 0.5 tablets (12.5 mg total) by mouth daily., Disp: 90 tablet, Rfl: 3 .  meclizine (ANTIVERT) 25 MG tablet, Take 1 tablet (25 mg total) by mouth 2 (two) times daily as needed for dizziness., Disp: 60 tablet, Rfl: 5 .  metoprolol succinate (TOPROL-XL) 50 MG 24 hr tablet, Take 1 tablet (50 mg total) by mouth daily. Take with or immediately following a meal., Disp: 90 tablet, Rfl: 3 .  mirabegron ER (MYRBETRIQ) 25 MG TB24 tablet, Take 1 tablet (25 mg total) by mouth daily., Disp: 30 tablet, Rfl: 11 .  montelukast (SINGULAIR) 10 MG tablet, Take 1 tablet (10 mg total) by mouth at bedtime., Disp: 90 tablet, Rfl: 3 .  mupirocin ointment (BACTROBAN) 2 %, Apply 1 application topically 2 (two) times daily. Both nostrils (nose) x 5 days, Disp: 30 g, Rfl: 0 .  nitroGLYCERIN (NITROSTAT) 0.4 MG SL tablet, Place 1 tablet (0.4 mg total) under the tongue every 5 (five) minutes as needed., Disp: 25 tablet, Rfl: 6 .  ondansetron (ZOFRAN) 4 MG tablet, Take 1 tablet (4 mg total) by mouth every 8 (eight) hours as needed for nausea or vomiting., Disp: 20 tablet, Rfl: 0 .  PARoxetine (PAXIL) 20 MG tablet, Take 1 tablet (20 mg total) by mouth daily., Disp: 90 tablet, Rfl: 3 .  potassium chloride (KLOR-CON) 10 MEQ tablet, Take 1 tablet (10 mEq total) by mouth daily., Disp: 90 tablet, Rfl: 3 .  pregabalin (LYRICA) 75 MG capsule, Take by mouth., Disp: , Rfl:  .  REPATHA SURECLICK 865 MG/ML SOAJ, INJECT 1 PEN INTO THE SKIN EVERY 14 (FOURTEEN) DAYS, Disp: 2 mL, Rfl: 2 .  rosuvastatin (CRESTOR) 20 MG tablet, Take 1 tablet (20 mg total) by mouth at bedtime., Disp: 90 tablet, Rfl: 3 .  traZODone (DESYREL) 50 MG tablet, Take 0.5-1 tablets (25-50 mg total) by mouth at bedtime as needed for sleep., Disp: 90 tablet, Rfl: 3 .  XARELTO 2.5 MG TABS tablet, Take 1 tablet by mouth twice daily, Disp: 180 tablet, Rfl: 1 .  dicyclomine (BENTYL) 10 MG capsule, Take 1 capsule (10 mg total) by mouth 3  (three) times  daily before meals. As needed for abdominal pain (Patient not taking: Reported on 08/21/2020), Disp: 120 capsule, Rfl: 11 .  metFORMIN (GLUCOPHAGE XR) 500 MG 24 hr tablet, Take 1 tablet (500 mg total) by mouth daily with breakfast. (Patient not taking: Reported on 08/21/2020), Disp: 90 tablet, Rfl: 1 .  sodium chloride (OCEAN) 0.65 % SOLN nasal spray, Place 2 sprays into both nostrils daily as needed for congestion., Disp: 30 mL, Rfl: 11  EXAM:  VITALS per patient if applicable:  GENERAL: alert, oriented, appears well and in no acute distress  HEENT: atraumatic, conjunttiva clear, no obvious abnormalities on inspection of external nose and ears  NECK: normal movements of the head and neck  LUNGS: on inspection no signs of respiratory distress, breathing rate appears normal, no obvious gross SOB, gasping or wheezing  CV: no obvious cyanosis  MS: moves all visible extremities without noticeable abnormality  PSYCH/NEURO: pleasant and cooperative, no obvious depression or anxiety, speech and thought processing grossly intact  Skin: mass left upper arm and right inner posterior thigh ? Abscess lue and lipoma right post inner thigh  ASSESSMENT AND PLAN:  Discussed the following assessment and plan:  Abscess of arm, left - Plan: doxycycline (VIBRA-TABS) 100 MG tablet bid x 7-10 days with food   Mass of right inner thigh ? Lipoma vs lymphadenopathy may need surgery consult for removal   Pansinusitis, unspecified chronicity - Plan: sodium chloride (OCEAN) 0.65 % SOLN nasal spray Needs NS sent in   Yeast vaginitis - Plan: fluconazole (DIFLUCAN) 150 MG tablet  MRSA (methicillin resistant Staphylococcus aureus) carrier - Plan: mupirocin ointment (BACTROBAN) 2 %    -we discussed possible serious and likely etiologies, options for evaluation and workup, limitations of telemedicine visit vs in person visit, treatment, treatment risks and precautions.   I discussed the  assessment and treatment plan with the patient. The patient was provided an opportunity to ask questions and all were answered. The patient agreed with the plan and demonstrated an understanding of the instructions.    Time 20 min Delorise Jackson, MD

## 2020-08-21 NOTE — Progress Notes (Signed)
Knot on the left arm. States that it is swollen and red with no skin breaks or bite marks. Noticed this last week after waking up.  Located on the deltoid of the left arm.  Has not spread and no one else around her having similar markings.  Also felt a mass on the back of her right thigh.

## 2020-08-22 LAB — CBC WITH DIFFERENTIAL/PLATELET
Absolute Monocytes: 447 cells/uL (ref 200–950)
Basophils Absolute: 21 cells/uL (ref 0–200)
Basophils Relative: 0.4 %
Eosinophils Absolute: 78 cells/uL (ref 15–500)
Eosinophils Relative: 1.5 %
HCT: 42.1 % (ref 35.0–45.0)
Hemoglobin: 14.3 g/dL (ref 11.7–15.5)
Lymphs Abs: 2839 cells/uL (ref 850–3900)
MCH: 31.8 pg (ref 27.0–33.0)
MCHC: 34 g/dL (ref 32.0–36.0)
MCV: 93.6 fL (ref 80.0–100.0)
MPV: 11.4 fL (ref 7.5–12.5)
Monocytes Relative: 8.6 %
Neutro Abs: 1815 cells/uL (ref 1500–7800)
Neutrophils Relative %: 34.9 %
Platelets: 152 10*3/uL (ref 140–400)
RBC: 4.5 10*6/uL (ref 3.80–5.10)
RDW: 13.2 % (ref 11.0–15.0)
Total Lymphocyte: 54.6 %
WBC: 5.2 10*3/uL (ref 3.8–10.8)

## 2020-08-22 LAB — PATHOLOGIST SMEAR REVIEW

## 2020-08-22 LAB — BASIC METABOLIC PANEL
BUN/Creatinine Ratio: 14 (calc) (ref 6–22)
BUN: 14 mg/dL (ref 7–25)
CO2: 25 mmol/L (ref 20–32)
Calcium: 9.6 mg/dL (ref 8.6–10.4)
Chloride: 110 mmol/L (ref 98–110)
Creat: 1.01 mg/dL — ABNORMAL HIGH (ref 0.50–0.99)
Glucose, Bld: 104 mg/dL — ABNORMAL HIGH (ref 65–99)
Potassium: 4.3 mmol/L (ref 3.5–5.3)
Sodium: 143 mmol/L (ref 135–146)

## 2020-08-22 NOTE — Addendum Note (Signed)
Addended by: Orland Mustard on: 08/22/2020 05:59 PM   Modules accepted: Orders

## 2020-08-23 ENCOUNTER — Telehealth: Payer: Self-pay | Admitting: Internal Medicine

## 2020-08-23 NOTE — Telephone Encounter (Signed)
What does this mean

## 2020-08-23 NOTE — Telephone Encounter (Signed)
FYI- I spoke with pt she states she had her grand toe on left foot removed.

## 2020-08-23 NOTE — Telephone Encounter (Signed)
Note from podiatry:    Procedure: Excision of entire/total nail  Location: Left 1st toe digit  Anesthesia: Lidocaine 1% plain; 1.5 mL and Marcaine 0.5% plain; 1.5 mL, digital block.  Skin Prep: Betadine.  Dressing: Silvadene; telfa; dry, sterile, compression dressing.  Technique: Following skin prep, the toe was exsanguinated and a tourniquet was secured at the base of the toe. The affected nail border was freed and excised. The tourniquet was then removed and sterile dressing applied.  Disposition: Patient tolerated procedure well. Patient to return in 2 weeks for follow-up.

## 2020-08-27 ENCOUNTER — Other Ambulatory Visit: Payer: Self-pay | Admitting: Internal Medicine

## 2020-08-27 DIAGNOSIS — E538 Deficiency of other specified B group vitamins: Secondary | ICD-10-CM

## 2020-08-27 MED ORDER — "NEEDLE (DISP) 25G X 1-1/2"" MISC": 1.0000 | 3 refills | Status: DC

## 2020-08-27 MED ORDER — CYANOCOBALAMIN 1000 MCG/ML IJ SOLN: 1000.0000 ug | INTRAMUSCULAR | 3 refills | Status: DC

## 2020-08-28 ENCOUNTER — Other Ambulatory Visit: Payer: Self-pay

## 2020-08-28 ENCOUNTER — Ambulatory Visit
Admission: RE | Admit: 2020-08-28 | Discharge: 2020-08-28 | Disposition: A | Discharge status: Home / Self Care | Payer: Medicare HMO | Source: Ambulatory Visit | Attending: Internal Medicine | Admitting: Internal Medicine

## 2020-08-28 DIAGNOSIS — R2241 Localized swelling, mass and lump, right lower limb: Secondary | ICD-10-CM | POA: Diagnosis not present

## 2020-08-30 ENCOUNTER — Telehealth: Payer: Self-pay | Admitting: Internal Medicine

## 2020-08-30 DIAGNOSIS — D1723 Benign lipomatous neoplasm of skin and subcutaneous tissue of right leg: Secondary | ICD-10-CM | POA: Insufficient documentation

## 2020-08-30 HISTORY — DX: Benign lipomatous neoplasm of skin and subcutaneous tissue of right leg: D17.23

## 2020-08-30 NOTE — Telephone Encounter (Signed)
Called Patient to give lab results.   Patient states the spot on her arm is still swollen and has a circle in the center. States there ar two lumps inside of it with skin peeling out. Middle is soft while outside is hard. Has been trying hot compresses on it. Says it is sore as well.   Please advise

## 2020-08-30 NOTE — Telephone Encounter (Signed)
Does she want to see surgery for both lumps?

## 2020-08-30 NOTE — Addendum Note (Signed)
Addended by: Orland Mustard on: 08/30/2020 06:07 PM   Modules accepted: Orders

## 2020-08-31 NOTE — Telephone Encounter (Signed)
Patient agreeable to dual referral for both lumps

## 2020-09-03 ENCOUNTER — Ambulatory Visit: Payer: Medicare HMO | Admitting: Surgery

## 2020-09-04 ENCOUNTER — Ambulatory Visit: Payer: Medicare HMO | Admitting: Podiatry

## 2020-09-06 ENCOUNTER — Ambulatory Visit (INDEPENDENT_AMBULATORY_CARE_PROVIDER_SITE_OTHER): Payer: Medicare HMO | Admitting: Podiatry

## 2020-09-06 ENCOUNTER — Encounter: Payer: Self-pay | Admitting: Podiatry

## 2020-09-06 ENCOUNTER — Other Ambulatory Visit: Payer: Self-pay

## 2020-09-06 DIAGNOSIS — G4733 Obstructive sleep apnea (adult) (pediatric): Secondary | ICD-10-CM | POA: Diagnosis not present

## 2020-09-06 DIAGNOSIS — L603 Nail dystrophy: Secondary | ICD-10-CM | POA: Diagnosis not present

## 2020-09-06 DIAGNOSIS — I259 Chronic ischemic heart disease, unspecified: Secondary | ICD-10-CM | POA: Diagnosis not present

## 2020-09-06 DIAGNOSIS — G4710 Hypersomnia, unspecified: Secondary | ICD-10-CM | POA: Diagnosis not present

## 2020-09-06 NOTE — Progress Notes (Signed)
Subjective: [name] is a 60 y.o.  female returns to office today for follow up evaluation after having left 1st total nail avulsion performed. Patient has been soaking using epsom salt and applying topical antibiotic covered with bandaid daily. Patient denies fevers, chills, nausea, vomiting. Denies any calf pain, chest pain, SOB.   Objective:  Vitals: Reviewed  General: Well developed, nourished, in no acute distress, alert and oriented x3   Dermatology: Skin is warm, dry and supple bilateral. left 1st nail bed appears to be clean, dry, with mild granular tissue and surrounding scab. There is no surrounding erythema, edema, drainage/purulence. The remaining nails appear unremarkable at this time. There are no other lesions or other signs of infection present.  Neurovascular status: Intact. No lower extremity swelling; No pain with calf compression bilateral.  Musculoskeletal: Decreased tenderness to palpation of the left 1st nail bed. Muscular strength within normal limits bilateral.   Assesement and Plan: S/p total nail avulsion, doing well.   -Continue soaking in epsom salts twice a day followed by antibiotic ointment and a band-aid. Can leave uncovered at night. Continue this until completely healed.  -If the area has not healed in 2 weeks, call the office for follow-up appointment, or sooner if any problems arise.  -Monitor for any signs/symptoms of infection. Call the office immediately if any occur or go directly to the emergency room. Call with any questions/concerns.  Boneta Lucks, DPM

## 2020-09-07 ENCOUNTER — Other Ambulatory Visit: Payer: Self-pay | Admitting: Cardiovascular Disease

## 2020-09-11 ENCOUNTER — Encounter: Payer: Medicare HMO | Attending: Internal Medicine | Admitting: *Deleted

## 2020-09-11 ENCOUNTER — Encounter: Payer: Self-pay | Admitting: *Deleted

## 2020-09-11 ENCOUNTER — Other Ambulatory Visit: Payer: Self-pay

## 2020-09-11 VITALS — BP 130/78 | Ht 65.0 in | Wt 221.4 lb

## 2020-09-11 DIAGNOSIS — E11628 Type 2 diabetes mellitus with other skin complications: Secondary | ICD-10-CM

## 2020-09-11 DIAGNOSIS — E1159 Type 2 diabetes mellitus with other circulatory complications: Secondary | ICD-10-CM | POA: Diagnosis not present

## 2020-09-11 DIAGNOSIS — I152 Hypertension secondary to endocrine disorders: Secondary | ICD-10-CM | POA: Diagnosis not present

## 2020-09-11 IMAGING — MR MR CERVICAL SPINE W/O CM
5 series · 38 of 48 positions shown · non-contrast
Comparison: MRI of the cervical spine January 16, 2018

CLINICAL DATA: Neck pain with right arm pain.

EXAM:
MRI CERVICAL SPINE WITHOUT CONTRAST
TECHNIQUE: Multiplanar, multisequence MR imaging of the cervical spine was
performed. No intravenous contrast was administered.

[Series 5: T2 · sagittal · 3.0mm · 0.62mm/px · 8 of 15 slices shown (1 of 2)]
[im 1/15]
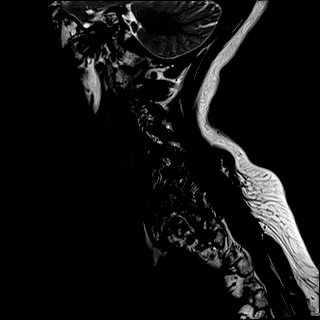
[im 3/15]
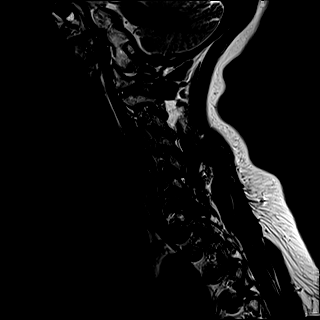
[im 5/15]
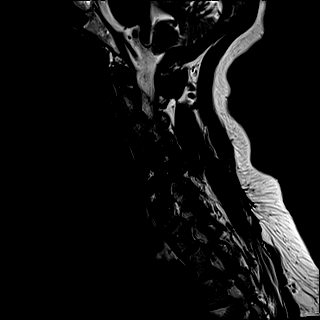
[im 7/15]
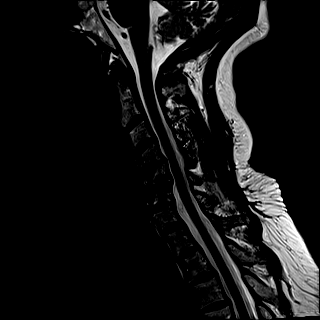
[im 9/15]
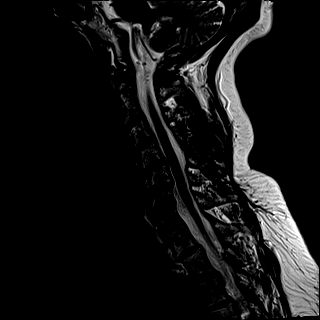
[im 11/15]
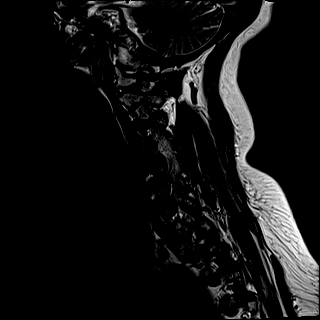
[im 13/15]
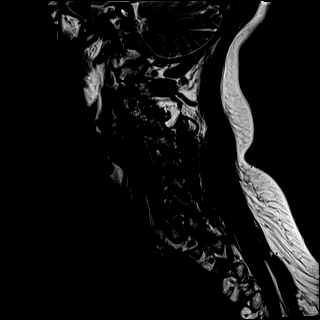
[im 15/15]
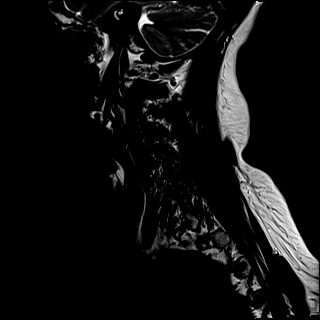

[Series 6: FLAIR · sagittal · 3.0mm · 0.78mm/px · 7 of 15 slices shown]
[im 1/15]
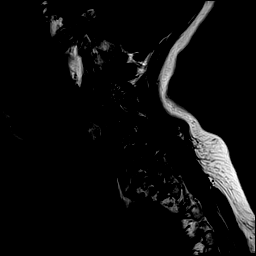
[im 3/15]
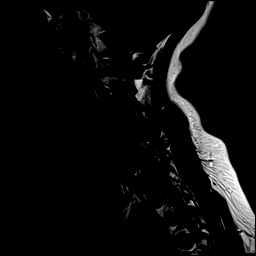
[im 5/15]
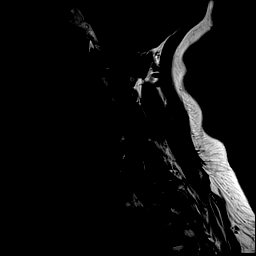
[im 8/15]
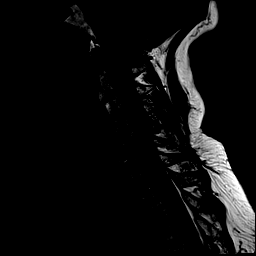
[im 10/15]
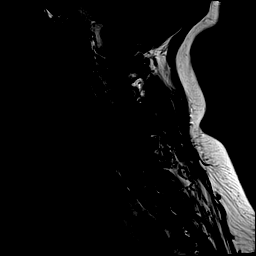
[im 12/15]
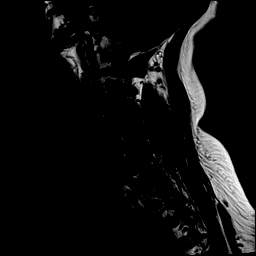
[im 15/15]
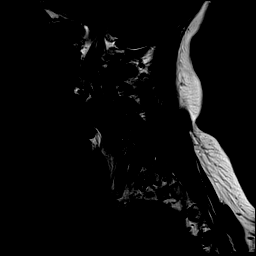

[Series 7: STIR · sagittal · 3.0mm · 0.62mm/px · 7 of 15 slices shown]
[im 1/15]
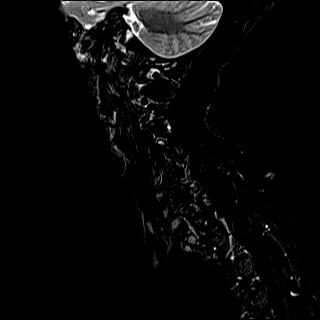
[im 3/15]
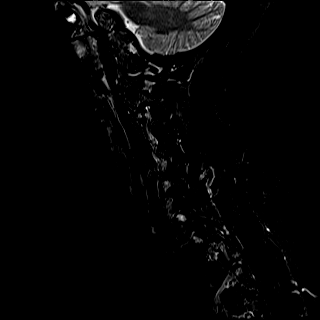
[im 5/15]
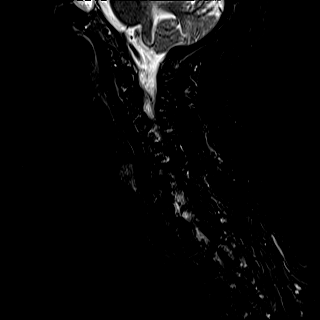
[im 8/15]
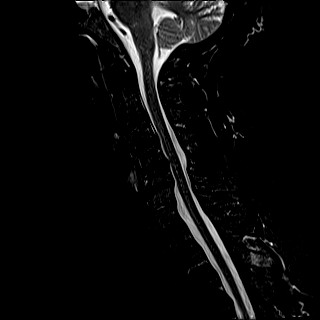
[im 10/15]
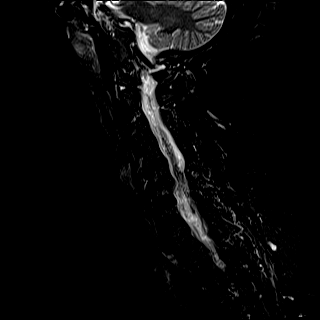
[im 12/15]
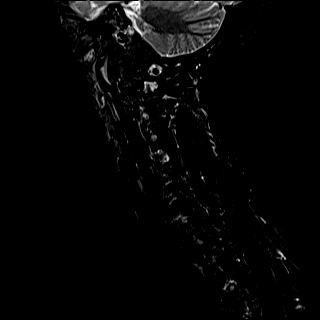
[im 15/15]
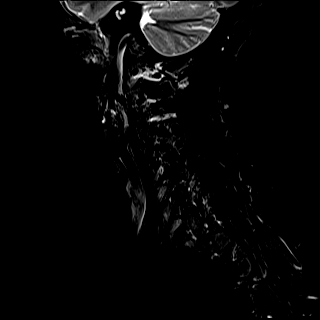

[Series 8: T2 · axial · 3.0mm · 0.70mm/px · z∈[-116,-26]mm · 9 of 28 slices shown (2 of 2)]
[im 1/28]
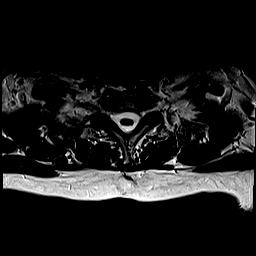
[im 5/28]
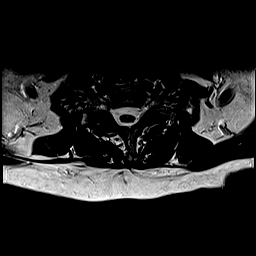
[im 10/28]
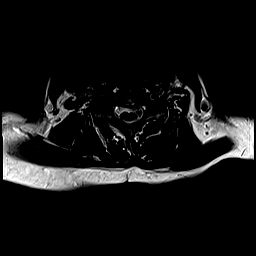
[im 12/28]
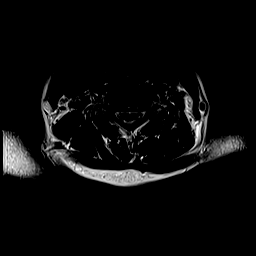
[im 14/28]
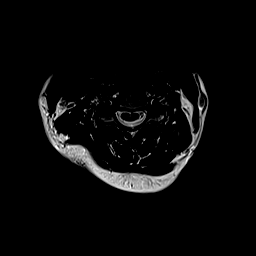
[im 16/28]
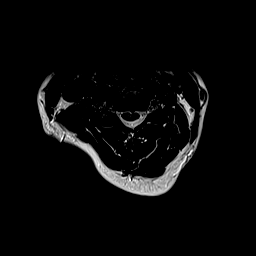
[im 19/28]
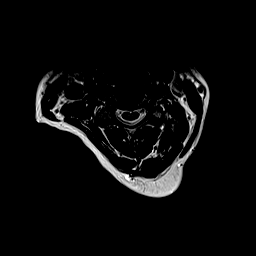
[im 23/28]
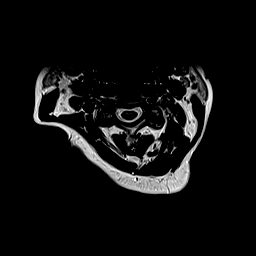
[im 28/28]
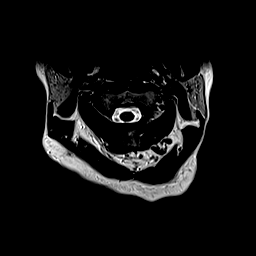

[Series 9: ax mpgr · axial · 3.0mm · 0.35mm/px · z∈[-116,-43]mm · 7 of 28 slices shown]
[im 1/28]
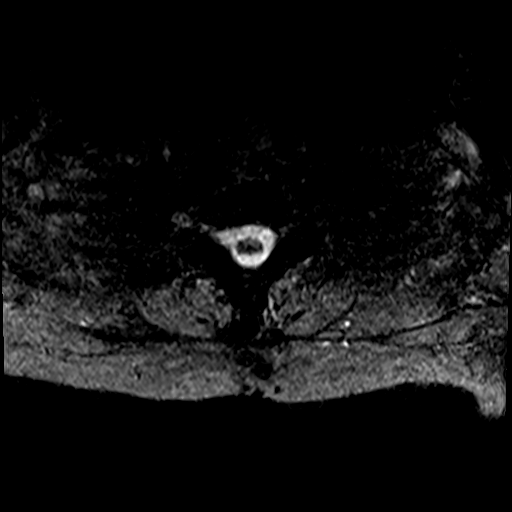
[im 5/28]
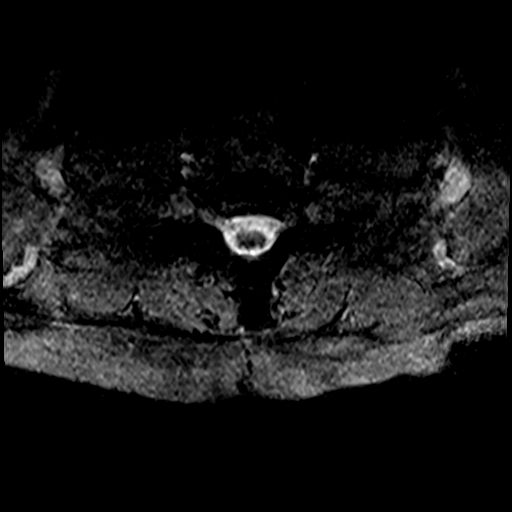
[im 10/28]
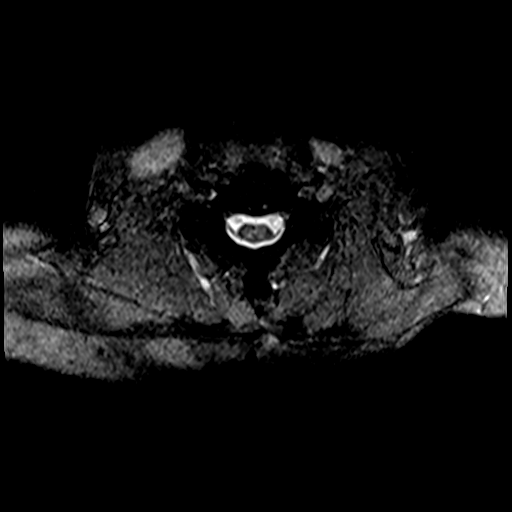
[im 12/28]
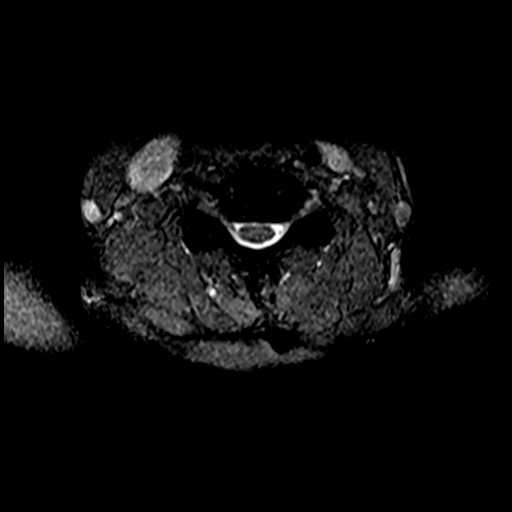
[im 16/28]
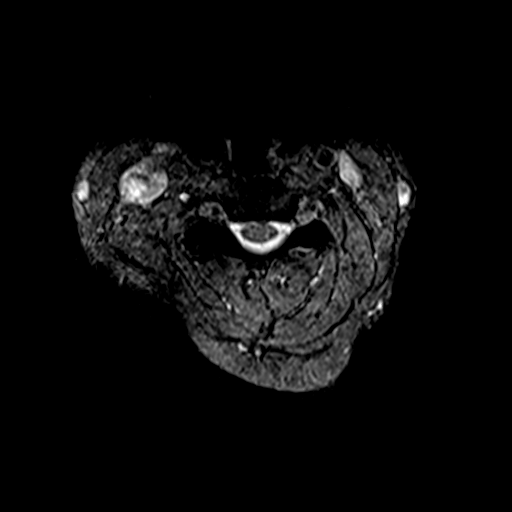
[im 19/28]
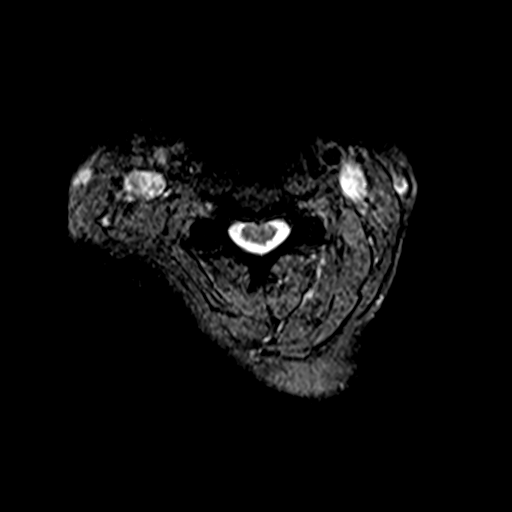
[im 23/28]
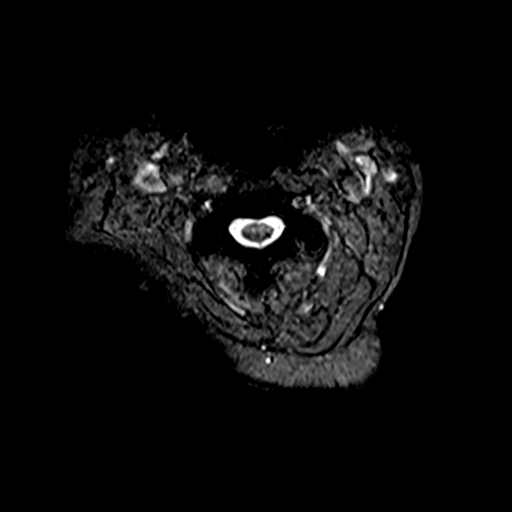

[38 of 48 positions shown; findings below may reference images not displayed]

FINDINGS: Alignment: Reversal of the cervical curvature, unchanged.

Vertebrae: No fracture, evidence of discitis, or bone lesion.

Cord: Normal signal and morphology.

Posterior Fossa, vertebral arteries, paraspinal tissues: Negative.

Disc levels:

C2-3: No spinal canal or neural foraminal stenosis.

C3-4: Tiny posterior disc protrusion without significant spinal
canal stenosis. There is no neural foraminal narrowing.

C4-5: Small posterior disc protrusion without significant spinal
canal stenosis. Uncovertebral and facet degenerative changes
resulting in mild right neural foraminal narrowing.

C5-6: Loss of disc height and small posterior disc osteophyte
complex resulting in mild spinal canal stenosis. Uncovertebral and
facet degenerative changes resulting in moderate bilateral neural
foraminal narrowing. Findings are unchanged from prior.

C6-7: Small posterior disc protrusion without significant spinal
canal stenosis. Uncovertebral and facet degenerative changes
resulting in moderate bilateral neural foraminal narrowing,
progressed from prior.

C7-T1: No spinal canal or neural foraminal stenosis.
IMPRESSION: 1. Multilevel degenerative changes of the cervical spine as
described above, slightly progressed at C6-C7 where there is
moderate bilateral neural foraminal narrowing.
2. Mild spinal canal stenosis and moderate bilateral neural
foraminal narrowing at C5-C6.

## 2020-09-11 NOTE — Patient Instructions (Signed)
Check blood sugars 2 x day before breakfast and 2 hrs after supper  1 - 2 x week Bring blood sugar records to the next class  Exercise:  Begin walking  for  10  minutes  3  days a week and gradually increase to 30 minutes 5 x week  Eat 3 meals day,  1-2  snacks a day Space meals 4-6 hours apart Don't skip meals - eat at least 1 serving of protein and 1 serving of carbohydrate  Avoid sugar sweetened drinks (soda, juices)  Return for classes on:
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
## 2020-09-17 NOTE — Progress Notes (Unsigned)
Cardiology Office Note  Date:  09/18/2020   ID:  Brody Kump, DOB 03/03/1961, MRN [id number]  PCP:  [name], [name], MD   Chief Complaint  Patient presents with  . Follow-up    Annual follow up. Medications verbally reviewed with patient.     HPI:  60 yo woman with a  long smoking history,   CAD s/p  NSTEMI at [facility]  1/12, with BMS to pLAD (80% stenosis),  residual severe stenosis of the RCA/nondominant small vessel,  continued chest pain,  cardiac catheterization showing distal left main stenosis involving a high grade ostial LAD stenosis and circumflex disease approximately 90%, 90% diagonal disease, 80% nondominant RCA disease with ejection fraction 45%,  CABG x 4 in 2012 with a LIMA to the LAD, vein graft to the diagonal, vein graft to the marginal and vein graft to the RCA,   peripheral vascular disease,  S/p atherectomy and PTA of her right SFA on 01/15/11,   outpatient stress test  July 2013 , OSA who presents for routine followup of her coronary artery disease  LOV 09/2019 Boil on left shoulder, week #5 Needs to see surgery, some atypical arm pain  Also swelling right medial thigh, "knot"  Waxing waning leg swelling Leg numb comes and goes "sciatica/bursitis"  BP well controlled at home 409 to 811 systolic past 2 months Fell yesterday, some dizziness (past few months) Takes meclizine BID  Ejection fraction low 40 to 45% in September 2018 Taking Lasix daily  Denies any significant shortness of breath or chest pain on exertion concerning for angina  Lab work reviewed Total chol 98, LDL 27, on [place]atha Hemoglobin A1c 6.7, eating some bread, trending upward CR 1.02   EKG personally reviewed by myself on todays visit Shows normal sinus rhythm with rate 60 bpm T-wave abnormality precordial leads, no change from prior EKG  Other past medical history Prior  outpatient stress test  July 2013 showed significant breast attenuation artifact, inferior wall  artifact from GI uptake. Overall no significant ischemia. History of chronic left arm and upper left chest  pain and was seen pain clinic. Prior diagnosis of carpal tunnel syndrome  occasional left breast pain radiating to her mediastinum.    Previously seen by ear nose throat for vestibular testing as a cause of her dizziness.  EGD and colonoscopy which by her report showed hiatal hernia. Prior problems with swallowing  She has had MRI of the neck showing cervical disc disease  Lab work shows total cholesterol 151, LDL 79, HDL 52, hemoglobin A1c 6.1  Echocardiogram in the hospital showed normal LV systolic function ejection fraction 50-55%, mild MR, mild to moderate TR with right ventricular systolic pressures 91-47  PMH:   has a past medical history of Allergy, Anemia, Arthritis, Asthma, Bacterial vaginitis, Blood in stool, Brachial neuritis or radiculitis NOS, Brain tumor (benign) ([place]) (07/2017), Bronchitis, Cardiac arrhythmia, Cervical neck pain with evidence of disc disease, Chronic combined systolic and diastolic CHF (congestive heart failure) ([place]), Chronic pain, Cocaine abuse, in remission ([place]), Colon polyps, COPD (chronic obstructive pulmonary disease) ([place]), Coronary artery disease, Depression, Diabetes mellitus without complication ([place]), Family history of colon cancer, Generalized headaches, GERD (gastroesophageal reflux disease), Headache, Heart murmur, Hematochezia, Hepatitis, History of cervical cancer, History of drug abuse ([place]), History of hepatitis B, History of MI (myocardial infarction), Hyperlipidemia, Hypertension, Ischemic cardiomyopathy, Myocardial infarction ([place]) (2003, 2012), PAD (peripheral artery disease) ([place]), 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  PSH:    Past Surgical History:  Procedure Laterality Date  . ABDOMINAL HYSTERECTOMY     2003  . CHOLECYSTECTOMY  1986  .  COLONOSCOPY  2008   3 polyps  . COLONOSCOPY WITH PROPOFOL N/A 02/06/2015   Procedure: COLONOSCOPY WITH PROPOFOL;  Surgeon: Lucilla Lame, MD;  Location: [facility] ENDOSCOPY;  Service: Endoscopy;  Laterality: N/A;  . COLONOSCOPY WITH PROPOFOL N/A 01/27/2017   Procedure: COLONOSCOPY WITH PROPOFOL;  Surgeon: Lucilla Lame, MD;  Location: [facility] ENDOSCOPY;  Service: Endoscopy;  Laterality: N/A;  . CORONARY ANGIOPLASTY     w/ stent placement x2  . CORONARY ARTERY BYPASS GRAFT  2012   Dr [name]  . CORONARY STENT PLACEMENT  2003   S/P MI  . CORONARY STENT PLACEMENT  2007   [place]  . ESOPHAGOGASTRODUODENOSCOPY (EGD) WITH PROPOFOL N/A 02/06/2015   Procedure: ESOPHAGOGASTRODUODENOSCOPY (EGD) WITH PROPOFOL;  Surgeon: Lucilla Lame, MD;  Location: [facility] ENDOSCOPY;  Service: Endoscopy;  Laterality: N/A;  . ESOPHAGOGASTRODUODENOSCOPY (EGD) WITH PROPOFOL N/A 01/27/2017   Procedure: ESOPHAGOGASTRODUODENOSCOPY (EGD) WITH PROPOFOL;  Surgeon: Lucilla Lame, MD;  Location: [facility] ENDOSCOPY;  Service: Endoscopy;  Laterality: N/A;  . ESOPHAGOGASTRODUODENOSCOPY (EGD) WITH PROPOFOL N/A 09/01/2019   Procedure: ESOPHAGOGASTRODUODENOSCOPY (EGD) WITH PROPOFOL;  Surgeon: Lin Landsman, MD;  Location: [facility] ENDOSCOPY;  Service: Gastroenterology;  Laterality: N/A;  . FEMORAL ARTERY STENT  10/2010   right sided (Dr. Burt Knack)  . KNEE ARTHROSCOPY WITH MEDIAL MENISECTOMY Right 08/20/2017   Procedure: KNEE ARTHROSCOPY WITH MEDIAL  AND LATERAL MENISECTOMY;  Surgeon: Hessie Knows, MD;  Location: [facility] ORS;  Service: Orthopedics;  Laterality: Right;  . POSTERIOR CERVICAL LAMINECTOMY N/A 03/08/2018   Procedure: POSTERIOR CERVICAL LAMINECTOMY-C7;  Surgeon: Deetta Perla, MD;  Location: [facility] ORS;  Service: Neurosurgery;  Laterality: N/A;  . TONSILLECTOMY    . TOTAL KNEE ARTHROPLASTY Right 04/14/2019   Procedure: RIGHT TOTAL KNEE ARTHROPLASTY;  Surgeon: Hessie Knows, MD;  Location: [facility] ORS;  Service: Orthopedics;  Laterality: Right;  . TUBAL LIGATION       Current Outpatient Medications  Medication Sig Dispense Refill  . albuterol (VENTOLIN HFA) 108 (90 Base) MCG/ACT inhaler Inhale 2 puffs into the lungs every 6 (six) hours as needed for wheezing or shortness of breath. 1 g 12  . aspirin EC 81 MG tablet Take 1 tablet (81 mg total) by mouth daily. 90 tablet 3  . Azelastine HCl 0.15 % SOLN Place 2 sprays into both nostrils daily as needed (allergies).     . cyanocobalamin (,VITAMIN B-12,) 1000 MCG/ML injection Inject 1 mL (1,000 mcg total) into the muscle every 30 (thirty) days. 4 mL 3  . cyclobenzaprine (FLEXERIL) 5 MG tablet Take 1-2 tablets (5-10 mg total) by mouth at bedtime as needed for muscle spasms. 60 tablet 11  . dicyclomine (BENTYL) 10 MG capsule Take 1 capsule (10 mg total) by mouth 3 (three) times daily before meals. As needed for abdominal pain 120 capsule 11  . esomeprazole (NEXIUM) 40 MG capsule Take 1 capsule (40 mg total) by mouth daily. Reported on 09/04/2015 90 capsule 3  . ezetimibe (ZETIA) 10 MG tablet Take 1 tablet by mouth once daily 90 tablet 3  . fluticasone (FLONASE) 50 MCG/ACT nasal spray Place 2 sprays into both nostrils daily as needed for allergies. Max dose 2 sprays each nostril 16 g 11  . Fluticasone-Umeclidin-Vilant (TRELEGY ELLIPTA) 100-62.5-25 MCG/INH AEPB Inhale 1 puff into the lungs daily. 28 each 5  . furosemide (LASIX) 20 MG tablet TAKE 1  TABLET BY MOUTH ONCE DAILY AND A SECOND TABLET IF NEEDED FOR FLUID BUILD UP 180 tablet 2  . glucose blood (ONE TOUCH ULTRA TEST) test strip Use as instructed check cbg qd E11.9 100 each 12  . hydrOXYzine (ATARAX/VISTARIL) 25 MG tablet Take 1-2 tablets (25-50 mg total) by mouth daily as needed. 60 tablet 2  . ipratropium-albuterol (DUONEB) 0.5-2.5 (3) MG/3ML SOLN Take 3 mLs by nebulization 2 (two) times daily as needed (wheezing/shortness of breath). 360 mL 11  . isosorbide mononitrate (IMDUR) 30 MG 24 hr tablet Take 1 tablet by mouth twice daily 180 tablet 1  . Lancets MISC 1  Device by Does not apply route daily. Lancets E11.9 90 each 3  . levocetirizine (XYZAL) 5 MG tablet Take 1 tablet (5 mg total) by mouth at bedtime as needed for allergies. 90 tablet 3  . linaclotide (LINZESS) 72 MCG capsule Take 1 capsule (72 mcg total) by mouth daily before breakfast. 90 capsule 3  . losartan (COZAAR) 25 MG tablet Take 0.5 tablets (12.5 mg total) by mouth daily. 90 tablet 3  . meclizine (ANTIVERT) 25 MG tablet Take 1 tablet (25 mg total) by mouth 2 (two) times daily as needed for dizziness. 60 tablet 5  . metoprolol succinate (TOPROL-XL) 50 MG 24 hr tablet Take 1 tablet (50 mg total) by mouth daily. Take with or immediately following a meal. 90 tablet 3  . mirabegron ER (MYRBETRIQ) 25 MG TB24 tablet Take 1 tablet (25 mg total) by mouth daily. 30 tablet 11  . montelukast (SINGULAIR) 10 MG tablet Take 1 tablet (10 mg total) by mouth at bedtime. 90 tablet 3  . mupirocin ointment (BACTROBAN) 2 % Apply 1 application topically 2 (two) times daily. Both nostrils (nose) x 5 days 30 g 0  . NEEDLE, DISP, 25 G 25G X 1-1/2" MISC 1 Device by Does not apply route every 30 (thirty) days. 4 each 3  . nitroGLYCERIN (NITROSTAT) 0.4 MG SL tablet Place 1 tablet (0.4 mg total) under the tongue every 5 (five) minutes as needed. 25 tablet 6  . ondansetron (ZOFRAN) 4 MG tablet Take 1 tablet (4 mg total) by mouth every 8 (eight) hours as needed for nausea or vomiting. 20 tablet 0  . PARoxetine (PAXIL) 20 MG tablet Take 1 tablet (20 mg total) by mouth daily. 90 tablet 3  . potassium chloride (KLOR-CON) 10 MEQ tablet Take 1 tablet (10 mEq total) by mouth daily. 90 tablet 3  . pregabalin (LYRICA) 75 MG capsule Take by mouth.    [name] REPATHA SURECLICK 102 MG/ML SOAJ INJECT 1 PEN INTO THE SKIN EVERY 14 DAYS 6 mL 0  . rosuvastatin (CRESTOR) 20 MG tablet Take 1 tablet (20 mg total) by mouth at bedtime. 90 tablet 3  . sodium chloride (OCEAN) 0.65 % SOLN nasal spray Place 2 sprays into both nostrils daily as needed  for congestion. 30 mL 11  . traZODone (DESYREL) 50 MG tablet Take 0.5-1 tablets (25-50 mg total) by mouth at bedtime as needed for sleep. 90 tablet 3  . XARELTO 2.5 MG TABS tablet Take 1 tablet by mouth twice daily 180 tablet 1   No current facility-administered medications for this visit.    Allergies:   Latex, Shellfish allergy, Sulfonamide derivatives, Watermelon [citrullus vulgaris], Soy allergy, Tomato, and Other   Social History:  The patient  reports that she quit smoking about 10 years ago. Her smoking use included cigarettes. She has a 38.00 pack-year smoking history. She has never  used smokeless tobacco. She reports that she does not drink alcohol and does not use drugs.   Family History:   family history includes Acute myelogenous leukemia in her grandson; Arthritis in her sister; Brain cancer in her sister; Breast cancer (age of onset: 26) in her mother; Cancer in her father and mother; Colon cancer (age of onset: 41) in her father; Diabetes in her brother, father, and sister; Glaucoma in her father; Heart disease in her father; Heart failure in her father; Hypertension in her brother, father, and sister; Kidney disease in her sister; Other in her father and sister.    Review of Systems: Review of Systems  Constitutional: Negative.   HENT: Negative.   Respiratory: Negative.   Gastrointestinal: Positive for constipation.  Musculoskeletal: Positive for back pain and joint pain.  Neurological: Negative.   Psychiatric/Behavioral: Negative.   All other systems reviewed and are negative.   PHYSICAL EXAM: VS:  BP 110/80 (BP Location: Right Arm, Patient Position: Sitting, Cuff Size: Normal)   Pulse 60   Ht 5\' 5"  (1.651 m)   Wt 224 lb (101.6 kg)   SpO2 96%   BMI 37.28 kg/m  , BMI Body mass index is 37.28 kg/m. Constitutional:  oriented to person, place, and time. No distress.  HENT:  Head: Grossly normal Eyes:  no discharge. No scleral icterus.  Neck: No JVD, no carotid  bruits  Cardiovascular: Regular rate and rhythm, no murmurs appreciated Pulmonary/Chest: Clear to auscultation bilaterally, no wheezes or rails Abdominal: Soft.  no distension.  no tenderness.  Musculoskeletal: Normal range of motion Neurological:  normal muscle tone. Coordination normal. No atrophy Skin: Skin warm and dry Psychiatric: normal affect, pleasant  Recent Labs: 07/24/2020: ALT 31; TSH 0.73 08/21/2020: BUN 14; Creat 1.01; Hemoglobin 14.3; Platelets 152; Potassium 4.3; Sodium 143    Lipid Panel Lab Results  Component Value Date   CHOL 125 07/24/2020   HDL 56.30 07/24/2020   LDLCALC 39 07/24/2020   TRIG 150.0 (H) 07/24/2020      Wt Readings from Last 3 Encounters:  09/18/20 224 lb (101.6 kg)  09/11/20 221 lb 6.4 oz (100.4 kg)  08/21/20 220 lb 9.6 oz (100.1 kg)     ASSESSMENT AND PLAN:  preop shoulder boil/infection Hold xarelto 2 days prior No further testing needed  CAD/ CABG: With stable angina Continue aspirin with Xarelto 2.5 mg BID   has PAD , CABG Currently with no symptoms of angina. No further workup at this time. Continue current medication regimen. Lipids controlled  Ischemic cardiomyopathy Losartan 12.5, lasix daily,  metoprolol succinate 50 mg daily Previously with cough on ACE inhibitor  Hyperlipidemia Cholesterol is at goal on the current lipid regimen. No changes to the medications were made.  Secondary hypertension, unspecified -  Low rpessure, may need to cut back on secon imdur, She will monitor sx  OSA on CPAP  Previously seen by pulmonary Tested positive per the patient On CPAP  COPD   stopped smoking several years ago breathing stable  Chronic pain Chest, legs, back, arms Managed by chiropractic, massage  Leg swelling On lasix daily, euvolemic If dehydrated, skip lasix periodically   Total encounter time more than 25 minutes  Greater than 50% was spent in counseling and coordination of care with the  patient   Orders Placed This Encounter  Procedures  . EKG 12-Lead     Signed, Esmond Plants, M.D., Ph.D. 09/18/2020  [place], [place]

## 2020-09-18 ENCOUNTER — Encounter: Payer: Self-pay | Admitting: Internal Medicine

## 2020-09-18 ENCOUNTER — Ambulatory Visit (INDEPENDENT_AMBULATORY_CARE_PROVIDER_SITE_OTHER): Payer: Medicare HMO | Admitting: Cardiovascular Disease

## 2020-09-18 ENCOUNTER — Telehealth: Payer: Self-pay

## 2020-09-18 ENCOUNTER — Encounter: Payer: Self-pay | Admitting: Cardiovascular Disease

## 2020-09-18 ENCOUNTER — Other Ambulatory Visit: Payer: Self-pay

## 2020-09-18 ENCOUNTER — Other Ambulatory Visit: Payer: Self-pay | Admitting: Internal Medicine

## 2020-09-18 VITALS — BP 110/80 | HR 60 | Ht 65.0 in | Wt 224.0 lb

## 2020-09-18 DIAGNOSIS — I5042 Chronic combined systolic (congestive) and diastolic (congestive) heart failure: Secondary | ICD-10-CM

## 2020-09-18 DIAGNOSIS — I255 Ischemic cardiomyopathy: Secondary | ICD-10-CM | POA: Diagnosis not present

## 2020-09-18 DIAGNOSIS — I739 Peripheral vascular disease, unspecified: Secondary | ICD-10-CM | POA: Diagnosis not present

## 2020-09-18 DIAGNOSIS — I10 Essential (primary) hypertension: Secondary | ICD-10-CM | POA: Diagnosis not present

## 2020-09-18 DIAGNOSIS — I25118 Atherosclerotic heart disease of native coronary artery with other forms of angina pectoris: Secondary | ICD-10-CM

## 2020-09-18 DIAGNOSIS — E782 Mixed hyperlipidemia: Secondary | ICD-10-CM | POA: Diagnosis not present

## 2020-09-18 DIAGNOSIS — Z9889 Other specified postprocedural states: Secondary | ICD-10-CM

## 2020-09-18 DIAGNOSIS — L03114 Cellulitis of left upper limb: Secondary | ICD-10-CM

## 2020-09-18 DIAGNOSIS — E785 Hyperlipidemia, unspecified: Secondary | ICD-10-CM

## 2020-09-18 MED ORDER — DOXYCYCLINE MONOHYDRATE 100 MG PO TABS: 100.0000 mg | ORAL_TABLET | Freq: Two times a day (BID) | ORAL | 0 refills | Status: DC

## 2020-09-18 NOTE — Telephone Encounter (Signed)
Ive sent doxycycline again 2x per day to your pharmacy with food but please see surgery 09/24/20 your wound likely needs to be drained if fluid is collected in it   They can also address if any other type of antibiotic needed

## 2020-09-18 NOTE — Patient Instructions (Addendum)
Medication Instructions:  Take isosorbide twice a day Hold the second isosorbide for low pressure or dizziness  If you need a refill on your cardiac medications before your next appointment, please call your pharmacy.    Lab work: No new labs needed   If you have labs (blood work) drawn today and your tests are completely normal, you will receive your results only by: [name] MyChart Message (if you have MyChart) OR . A paper copy in the mail If you have any lab test that is abnormal or we need to change your treatment, we will call you to review the results.   Testing/Procedures: No new testing needed   Follow-Up: At [facility], you and your health needs are our priority.  As part of our continuing mission to provide you with exceptional heart care, we have created designated Provider Care Teams.  These Care Teams include your primary Cardiologist (physician) and Advanced Practice Providers (APPs -  Physician Assistants and Nurse Practitioners) who all work together to provide you with the care you need, when you need it.  . You will need a follow up appointment in 6 months, APP ok  . Providers on your designated Care Team:   . Murray Hodgkins, NP . Christell Faith, PA-C . Marrianne Mood, PA-C  Any Other Special Instructions Will Be Listed Below (If Applicable).  COVID-19 Vaccine Information can be found at: ShippingScam.co.uk For questions related to vaccine distribution or appointments, please email vaccine@[email or web address] .[email or web address] or call [phone].

## 2020-09-18 NOTE — Telephone Encounter (Signed)
Please advise, does Patient need to be re-evaluated?

## 2020-09-18 NOTE — Telephone Encounter (Signed)
Pt called and states Dr [name] wanted her to call Dr [place] to get more antibiotics for bite on her left arm. Please advise

## 2020-09-20 ENCOUNTER — Other Ambulatory Visit: Payer: Self-pay | Admitting: Internal Medicine

## 2020-09-20 DIAGNOSIS — B373 Candidiasis of vulva and vagina: Secondary | ICD-10-CM

## 2020-09-20 DIAGNOSIS — B3731 Acute candidiasis of vulva and vagina: Secondary | ICD-10-CM

## 2020-09-20 MED ORDER — FLUCONAZOLE 150 MG PO TABS: 150.0000 mg | ORAL_TABLET | Freq: Once | ORAL | 0 refills | Status: AC

## 2020-09-20 NOTE — Telephone Encounter (Signed)
Patient needing a yeast pill sent in as well.   Patient informed and verbalized understanding.

## 2020-09-21 ENCOUNTER — Encounter: Payer: Self-pay | Admitting: Family

## 2020-09-21 ENCOUNTER — Telehealth (INDEPENDENT_AMBULATORY_CARE_PROVIDER_SITE_OTHER): Payer: Medicare HMO | Admitting: Family

## 2020-09-21 VITALS — Ht 65.0 in | Wt 224.0 lb

## 2020-09-21 DIAGNOSIS — H579 Unspecified disorder of eye and adnexa: Secondary | ICD-10-CM

## 2020-09-21 DIAGNOSIS — L298 Other pruritus: Secondary | ICD-10-CM | POA: Diagnosis not present

## 2020-09-21 HISTORY — DX: Unspecified disorder of eye and adnexa: H57.9

## 2020-09-21 MED ORDER — OLOPATADINE HCL 0.1 % OP SOLN: 1.0000 [drp] | Freq: Two times a day (BID) | OPHTHALMIC | 2 refills | Status: DC

## 2020-09-21 NOTE — Assessment & Plan Note (Addendum)
Itching of both eyes with clear discharge. Limited exam as unable to see conjunctiva well over virtual. Working diagnosis of allergic conjunctivitis. Trial of Olopatadine (ophthalmic). Continue zyzal , singulair,azelastine. Patient will call if purulent discharge, eye pain, vision changes or failure to improve.

## 2020-09-21 NOTE — Progress Notes (Signed)
Virtual Visit via Video Note  I connected with@  on 09/21/20 at  8:30 AM EST by a video enabled telemedicine application and verified that I am speaking with the correct person using two identifiers.  Location patient: home Location provider:work  Persons participating in the virtual visit: patient, provider  I discussed the limitations of evaluation and management by telemedicine and the availability of in person appointments. The patient expressed understanding and agreed to proceed.   HPI: Acute visit Bilateral eyes are itching which started yesterday and woke up yesterday morning and right eye was swollen under the eye and the eyelid itself. Itchiness is most bothersome for her.  Right eye was stuck shut yesterday with eye boogers , no purulent discharge. Both eyes had had clear runny discharge .  No rash or erythema around eye, nasal congestion, eye pain, fever, cough, vision changes, HA, temporal,tenderness. No foreign body sensation in eyes.Whites of eyes is slightly red.  Breathing at baseline Throat is itching which is a typical allergy symptom for her. Allergic to pollen.   H/o asthma, seasonal allergies. Compliant azelastine nasal spray, singulair, zyzal. She has not been using Flonase.  She has been wipes for eye, Ocusoft lid scrub with some relief.  She doesn't wear makeup. Used a visine 'clear eye's drop yesterday which made if worse.  She doesn't wear contacts. Wears glasses.   Currently on doxycycline for left abscess of arm which has improved over the last one month. Continues to drain. She is seeing surgery 09/24/20.   ROS: See pertinent positives and negatives per HPI.    EXAM:  VITALS per patient if applicable: Ht 5\' 5"  (1.651 m)   Wt 224 lb (101.6 kg)   BMI 37.28 kg/m  BP Readings from Last 3 Encounters:  09/18/20 110/80  09/11/20 130/78  07/24/20 (!) 100/58   Wt Readings from Last 3 Encounters:  09/21/20 224 lb (101.6 kg)  09/18/20 224 lb (101.6 kg)   09/11/20 221 lb 6.4 oz (100.4 kg)    GENERAL: alert, oriented, appears well and in no acute distress  HEENT: atraumatic, conjunttiva clear, no obvious abnormalities on inspection of external nose and ears. No obvious swelling, erythema of face or orbits. No erythema of conjunctiva appreciated over video.   NECK: normal movements of the head and neck  LUNGS: on inspection no signs of respiratory distress, breathing rate appears normal, no obvious gross SOB, gasping or wheezing  CV: no obvious cyanosis  MS: moves all visible extremities without noticeable abnormality  PSYCH/NEURO: pleasant and cooperative, no obvious depression or anxiety, speech and thought processing grossly intact  ASSESSMENT AND PLAN:  Discussed the following assessment and plan:  Problem List Items Addressed This Visit      Other   Pruritus of both eyes - Primary    Itching of both eyes with clear discharge. Limited exam as unable to see conjunctiva well over virtual. Working diagnosis of allergic conjunctivitis. Trial of Olopatadine (ophthalmic). Continue zyzal , singulair,azelastine. Patient will call if purulent discharge, eye pain, vision changes or failure to improve.       Relevant Medications   olopatadine (PATANOL) 0.1 % ophthalmic solution      -we discussed possible serious and likely etiologies, options for evaluation and workup, limitations of telemedicine visit vs in person visit, treatment, treatment risks and precautions. Pt prefers to treat via telemedicine empirically rather then risking or undertaking an in person visit at this moment.  .   I discussed the assessment and treatment  plan with the patient. The patient was provided an opportunity to ask questions and all were answered. The patient agreed with the plan and demonstrated an understanding of the instructions.   The patient was advised to call back or seek an in-person evaluation if the symptoms worsen or if the condition fails to  improve as anticipated.   Mable Paris, FNP

## 2020-09-24 ENCOUNTER — Encounter: Payer: Self-pay | Admitting: Surgery

## 2020-09-24 ENCOUNTER — Ambulatory Visit (INDEPENDENT_AMBULATORY_CARE_PROVIDER_SITE_OTHER): Payer: Medicare HMO | Admitting: Surgery

## 2020-09-24 ENCOUNTER — Other Ambulatory Visit: Payer: Self-pay

## 2020-09-24 VITALS — BP 110/77 | HR 67 | Temp 99.0°F | Ht 65.0 in | Wt 223.2 lb

## 2020-09-24 DIAGNOSIS — D179 Benign lipomatous neoplasm, unspecified: Secondary | ICD-10-CM

## 2020-09-24 DIAGNOSIS — L02434 Carbuncle of left upper limb: Secondary | ICD-10-CM | POA: Diagnosis not present

## 2020-09-24 NOTE — Patient Instructions (Addendum)
Your CT is scheduled for 10/04/2020 [facility] [place] @ 1 pm (arrive at 12:45 pm), nothing to eat 4 prior to scan ( only clear liquids), pick up contrast at any [facility] Radiology Department between now and 10/03/2020. See follow up appointment below. If you have any concerns or questions, please feel free to call our office.     Lipoma  A lipoma is a noncancerous (benign) tumor that is made up of fat cells. This is a very common type of soft-tissue growth. Lipomas are usually found under the skin (subcutaneous). They may occur in any tissue of the body that contains fat. Common areas for lipomas to appear include the back, arms, shoulders, buttocks, and thighs. Lipomas grow slowly, and they are usually painless. Most lipomas do not cause problems and do not require treatment. What are the causes? The cause of this condition is not known. What increases the risk? You are more likely to develop this condition if:  You are 44-48 years old.  You have a family history of lipomas. What are the signs or symptoms? A lipoma usually appears as a small, round bump under the skin. In most cases, the lump will:  Feel soft or rubbery.  Not cause pain or other symptoms. However, if a lipoma is located in an area where it pushes on nerves, it can become painful or cause other symptoms. How is this diagnosed? A lipoma can usually be diagnosed with a physical exam. You may also have tests to confirm the diagnosis and to rule out other conditions. Tests may include:  Imaging tests, such as a CT scan or an MRI.  Removal of a tissue sample to be looked at under a microscope (biopsy). How is this treated? Treatment for this condition depends on the size of the lipoma and whether it is causing any symptoms.  For small lipomas that are not causing problems, no treatment is needed.  If a lipoma is bigger or it causes problems, surgery may be done to remove the lipoma. Lipomas can also be removed to  improve appearance. Most often, the procedure is done after applying a medicine that numbs the area (local anesthetic).  Liposuction may be done to reduce the size of the lipoma before it is removed through surgery, or it may be done to remove the lipoma. Lipomas are removed with this method in order to limit incision size and scarring. A liposuction tube is inserted through a small incision into the lipoma, and the contents of the lipoma are removed through the tube with suction. Follow these instructions at home:  Watch your lipoma for any changes.  Keep all follow-up visits as told by your health care provider. This is important. Contact a health care provider if:  Your lipoma becomes larger or hard.  Your lipoma becomes painful, red, or increasingly swollen. These could be signs of infection or a more serious condition. Get help right away if:  You develop tingling or numbness in an area near the lipoma. This could indicate that the lipoma is causing nerve damage. Summary  A lipoma is a noncancerous tumor that is made up of fat cells.  Most lipomas do not cause problems and do not require treatment.  If a lipoma is bigger or it causes problems, surgery may be done to remove the lipoma.  Contact a health care provider if your lipoma becomes larger or hard, or if it becomes painful, red, or increasingly swollen. Pain, redness, and swelling could be signs  of infection or a more serious condition. This information is not intended to replace advice given to you by your health care provider. Make sure you discuss any questions you have with your health care provider. Document Revised: 02/28/2019 Document Reviewed: 02/28/2019 Elsevier Patient Education  [place].

## 2020-09-25 DIAGNOSIS — I259 Chronic ischemic heart disease, unspecified: Secondary | ICD-10-CM | POA: Diagnosis not present

## 2020-09-25 DIAGNOSIS — G4733 Obstructive sleep apnea (adult) (pediatric): Secondary | ICD-10-CM | POA: Diagnosis not present

## 2020-09-25 DIAGNOSIS — G4710 Hypersomnia, unspecified: Secondary | ICD-10-CM | POA: Diagnosis not present

## 2020-09-26 ENCOUNTER — Telehealth: Payer: Self-pay | Admitting: *Deleted

## 2020-09-26 NOTE — Telephone Encounter (Signed)
Phone call from patient. She reports that she has a CT scan for one of her class dates and she wants to reschedule the series. She will not start on March 31.

## 2020-09-27 ENCOUNTER — Ambulatory Visit: Payer: Medicare HMO

## 2020-09-28 NOTE — Progress Notes (Signed)
Patient ID: [name], female   DOB: [birth year], 60 y.o.   MRN: [id number]  HPI [name] is a 60 y.o. female seen in consultation at the request of Dr. Terese Door.  She reports having a 1 week history of left lateral part of " boil ".  Endorses intermittent pain that is mild to moderate intensity and sharp in nature.  Worsening when she wears clothes that rub against the skin.  She also reports a new lump in that left arm.  Of note she is states that she has had some lumps in her right thigh. She has  Been taking antibiotics with some improvement on the left arm lesion. Recent laboratory values reveal a normal CBC with a borderline platelet count of 152k creatinine is 1.0.  hemoglobin A1c is 6.5 Does have multiple comorbidities to include diabetes, hypertension, COPD, referral vascular disease. She is taking doxycycline. Of note she does have a history of a CABG in 2012 and also PTA of the right SFA.  Her ejection fraction is 45%.   HPI  Past Medical History:  Diagnosis Date  . Allergy   . Anemia   . Arthritis   . Asthma   . Bacterial vaginitis   . Blood in stool   . Brachial neuritis or radiculitis NOS   . 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 being followed by neurosurgery/eye doctor and pcp. monitoring size.causes sinus problems  . Bronchitis   . Cardiac arrhythmia   . Cervical neck pain with evidence of disc disease    C5/6 disease, MRI done late 2012 - no records available  . Chronic combined systolic and diastolic CHF (congestive heart failure) ([place])    a. 08/2010 Echo: mildly reduced EF 40-45%, mild diffuse hypokinesis; b. 06/2016 Echo: EF 50%, no rwma, mild to mod TR; c. 03/2017 Echo: EF 40-45%, Gr1 DD (prior echo reviewed and EF felt to be lower than reported).  . Chronic pain   . 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 Colon polyps   . COPD (chronic obstructive pulmonary disease) ([place])    a. 12/2018 PFT: No obvious  obst/restrictive dzs.  . Coronary artery disease    a. PCI of LCX 2003; b. PCI of the LAD 2012 with a (2.5 x 8 mm BMS);  c.s/p CABG 4/12: L-LAD, VG-Dx, VG-OM, VG-RCA (Dr. Prescott Gum);  d. 01/2012 MV: inf infarct, attenuation, no ischemia; e. 02/2017 MV: signifi attenuation artifact. Fixed basal antlat/inflat scar vs artifact. Reversible apical lat and mid antlat defect - ? atten vs ischemia. F/u echo w/o wma->Med rx.  . Depression   . Diabetes mellitus without complication ([place])   . Family history of colon cancer   . Generalized headaches    frequent  . GERD (gastroesophageal reflux disease)   . Headache   . Heart murmur   . Hematochezia   . Hepatitis    history of hepatitis b  . History of cervical cancer    s/p cryotherapy  . History of drug abuse ([place])    cocaine, marijuana, clean since 1989  . History of hepatitis B    from eating undercooked liver  . History of MI (myocardial infarction)   . Hyperlipidemia   . Hypertension   . Ischemic cardiomyopathy    a. 03/2017 Echo: EF 40-45%, Gr1 DD.  [name] Myocardial infarction [facility]) 2003, 2012  . PAD (peripheral artery disease) ([facility])    a. s/p Right SFA atherectomy and  PTA 01/15/11; b. 07/2018 ABI: R 1.02, L 1.09.  [name] Pituitary mass ([place])    a. 12/2018 MRI Brain: Stable pituitary mass w/ 55mm area of necrosis. Mass abuts R cavernous sinus w/o definite invasion.  . Polyp of colon   . Seasonal allergies   . Sleep apnea    uses cpap  . Smoking history    quit 07/2010  . Thyroid disease   . Urinary incontinence     Past Surgical History:  Procedure Laterality Date  . ABDOMINAL HYSTERECTOMY     2003  . CHOLECYSTECTOMY  1986  . COLONOSCOPY  2008   3 polyps  . COLONOSCOPY WITH PROPOFOL N/A 02/06/2015   Procedure: COLONOSCOPY WITH PROPOFOL;  Surgeon: Lucilla Lame, MD;  Location: [facility] ENDOSCOPY;  Service: Endoscopy;  Laterality: N/A;  . COLONOSCOPY WITH PROPOFOL N/A 01/27/2017   Procedure: COLONOSCOPY WITH PROPOFOL;  Surgeon: Lucilla Lame, MD;   Location: [facility] ENDOSCOPY;  Service: Endoscopy;  Laterality: N/A;  . CORONARY ANGIOPLASTY     w/ stent placement x2  . CORONARY ARTERY BYPASS GRAFT  2012   Dr [name]  . CORONARY STENT PLACEMENT  2003   S/P MI  . CORONARY STENT PLACEMENT  2007   [place]  . ESOPHAGOGASTRODUODENOSCOPY (EGD) WITH PROPOFOL N/A 02/06/2015   Procedure: ESOPHAGOGASTRODUODENOSCOPY (EGD) WITH PROPOFOL;  Surgeon: Lucilla Lame, MD;  Location: [facility] ENDOSCOPY;  Service: Endoscopy;  Laterality: N/A;  . ESOPHAGOGASTRODUODENOSCOPY (EGD) WITH PROPOFOL N/A 01/27/2017   Procedure: ESOPHAGOGASTRODUODENOSCOPY (EGD) WITH PROPOFOL;  Surgeon: Lucilla Lame, MD;  Location: [facility] ENDOSCOPY;  Service: Endoscopy;  Laterality: N/A;  . ESOPHAGOGASTRODUODENOSCOPY (EGD) WITH PROPOFOL N/A 09/01/2019   Procedure: ESOPHAGOGASTRODUODENOSCOPY (EGD) WITH PROPOFOL;  Surgeon: Lin Landsman, MD;  Location: [facility] ENDOSCOPY;  Service: Gastroenterology;  Laterality: N/A;  . FEMORAL ARTERY STENT  10/2010   right sided (Dr. Burt Knack)  . KNEE ARTHROSCOPY WITH MEDIAL MENISECTOMY Right 08/20/2017   Procedure: KNEE ARTHROSCOPY WITH MEDIAL  AND LATERAL MENISECTOMY;  Surgeon: Hessie Knows, MD;  Location: [facility] ORS;  Service: Orthopedics;  Laterality: Right;  . POSTERIOR CERVICAL LAMINECTOMY N/A 03/08/2018   Procedure: POSTERIOR CERVICAL LAMINECTOMY-C7;  Surgeon: Deetta Perla, MD;  Location: [facility] ORS;  Service: Neurosurgery;  Laterality: N/A;  . TONSILLECTOMY    . TOTAL KNEE ARTHROPLASTY Right 04/14/2019   Procedure: RIGHT TOTAL KNEE ARTHROPLASTY;  Surgeon: Hessie Knows, MD;  Location: [facility] ORS;  Service: Orthopedics;  Laterality: Right;  . TUBAL LIGATION      Family History  Problem Relation Age of Onset  . Hypertension Father   . Heart failure Father   . Diabetes Father   . Colon cancer Father 23  . Glaucoma Father   . Cancer Father        colorectal   . Heart disease Father   . Other Father        glaucoma  . Breast cancer Mother 20       breast cancer,  late 60's  . Cancer Mother        breast  . Brain cancer Sister   . Arthritis Sister   . Diabetes Sister   . Hypertension Sister   . Kidney disease Sister   . Diabetes Brother   . Hypertension Brother   . Other Sister        brain tumor   . Acute myelogenous leukemia Grandson        06/2019  . Coronary artery disease Neg Hx   . Stroke Neg Hx     Social History  Social History   Tobacco Use  . Smoking status: Former Smoker    Packs/day: 1.00    Years: 38.00    Pack years: 38.00    Types: Cigarettes    Quit date: 08/12/2010    Years since quitting: 10.1  . Smokeless tobacco: Never Used  Vaping Use  . Vaping Use: Never used  Substance Use Topics  . Alcohol use: No  . Drug use: No    Types: Cocaine    Comment: Remote Hx (crack cocaine and marijuana).none since 42yrs plus    Allergies  Allergen Reactions  . Latex Rash       . Shellfish Allergy Anaphylaxis    Hard shellfish/swelling in throat  . Sulfonamide Derivatives Anaphylaxis    Swelling in throat.  . Watermelon [Citrullus Vulgaris] Other (See Comments)    Throat itchy  . Soy Allergy     Diarrhea/upset stomach    . Tomato Itching    Throat itchy - also Raw carrots  . Other Swelling    Current Outpatient Medications  Medication Sig Dispense Refill  . albuterol (VENTOLIN HFA) 108 (90 Base) MCG/ACT inhaler Inhale 2 puffs into the lungs every 6 (six) hours as needed for wheezing or shortness of breath. 1 g 12  . aspirin EC 81 MG tablet Take 1 tablet (81 mg total) by mouth daily. 90 tablet 3  . Azelastine HCl 0.15 % SOLN Place 2 sprays into both nostrils daily as needed (allergies).     . cyanocobalamin (,VITAMIN B-12,) 1000 MCG/ML injection Inject 1 mL (1,000 mcg total) into the muscle every 30 (thirty) days. 4 mL 3  . cyclobenzaprine (FLEXERIL) 5 MG tablet Take 1-2 tablets (5-10 mg total) by mouth at bedtime as needed for muscle spasms. 60 tablet 11  . dicyclomine (BENTYL) 10 MG capsule Take 1 capsule (10 mg  total) by mouth 3 (three) times daily before meals. As needed for abdominal pain 120 capsule 11  . doxycycline (ADOXA) 100 MG tablet Take 1 tablet (100 mg total) by mouth 2 (two) times daily. With food 20 tablet 0  . esomeprazole (NEXIUM) 40 MG capsule Take 1 capsule (40 mg total) by mouth daily. Reported on 09/04/2015 90 capsule 3  . ezetimibe (ZETIA) 10 MG tablet Take 1 tablet by mouth once daily 90 tablet 3  . fluticasone (FLONASE) 50 MCG/ACT nasal spray Place 2 sprays into both nostrils daily as needed for allergies. Max dose 2 sprays each nostril 16 g 11  . Fluticasone-Umeclidin-Vilant (TRELEGY ELLIPTA) 100-62.5-25 MCG/INH AEPB Inhale 1 puff into the lungs daily. 28 each 5  . furosemide (LASIX) 20 MG tablet TAKE 1 TABLET BY MOUTH ONCE DAILY AND A SECOND TABLET IF NEEDED FOR FLUID BUILD UP 180 tablet 2  . glucose blood (ONE TOUCH ULTRA TEST) test strip Use as instructed check cbg qd E11.9 100 each 12  . hydrOXYzine (ATARAX/VISTARIL) 25 MG tablet Take 1-2 tablets (25-50 mg total) by mouth daily as needed. 60 tablet 2  . ipratropium-albuterol (DUONEB) 0.5-2.5 (3) MG/3ML SOLN Take 3 mLs by nebulization 2 (two) times daily as needed (wheezing/shortness of breath). 360 mL 11  . isosorbide mononitrate (IMDUR) 30 MG 24 hr tablet Take 1 tablet by mouth twice daily 180 tablet 1  . Lancets MISC 1 Device by Does not apply route daily. Lancets E11.9 90 each 3  . levocetirizine (XYZAL) 5 MG tablet Take 1 tablet (5 mg total) by mouth at bedtime as needed for allergies. 90 tablet 3  . losartan (  COZAAR) 25 MG tablet Take 0.5 tablets (12.5 mg total) by mouth daily. 90 tablet 3  . meclizine (ANTIVERT) 25 MG tablet Take 1 tablet (25 mg total) by mouth 2 (two) times daily as needed for dizziness. 60 tablet 5  . metoprolol succinate (TOPROL-XL) 50 MG 24 hr tablet Take 1 tablet (50 mg total) by mouth daily. Take with or immediately following a meal. 90 tablet 3  . mirabegron ER (MYRBETRIQ) 25 MG TB24 tablet Take 1  tablet (25 mg total) by mouth daily. 30 tablet 11  . montelukast (SINGULAIR) 10 MG tablet Take 1 tablet (10 mg total) by mouth at bedtime. 90 tablet 3  . mupirocin ointment (BACTROBAN) 2 % Apply 1 application topically 2 (two) times daily. Both nostrils (nose) x 5 days 30 g 0  . NEEDLE, DISP, 25 G 25G X 1-1/2" MISC 1 Device by Does not apply route every 30 (thirty) days. 4 each 3  . nitroGLYCERIN (NITROSTAT) 0.4 MG SL tablet Place 1 tablet (0.4 mg total) under the tongue every 5 (five) minutes as needed. 25 tablet 6  . olopatadine (PATANOL) 0.1 % ophthalmic solution Place 1 drop into both eyes 2 (two) times daily. 5 mL 2  . ondansetron (ZOFRAN) 4 MG tablet Take 1 tablet (4 mg total) by mouth every 8 (eight) hours as needed for nausea or vomiting. 20 tablet 0  . PARoxetine (PAXIL) 20 MG tablet Take 1 tablet (20 mg total) by mouth daily. 90 tablet 3  . potassium chloride (KLOR-CON) 10 MEQ tablet Take 1 tablet (10 mEq total) by mouth daily. 90 tablet 3  . pregabalin (LYRICA) 75 MG capsule Take by mouth.    [name] REPATHA SURECLICK 671 MG/ML SOAJ INJECT 1 PEN INTO THE SKIN EVERY 14 DAYS 6 mL 0  . rosuvastatin (CRESTOR) 20 MG tablet Take 1 tablet (20 mg total) by mouth at bedtime. 90 tablet 3  . sodium chloride (OCEAN) 0.65 % SOLN nasal spray Place 2 sprays into both nostrils daily as needed for congestion. 30 mL 11  . traZODone (DESYREL) 50 MG tablet Take 0.5-1 tablets (25-50 mg total) by mouth at bedtime as needed for sleep. 90 tablet 3  . XARELTO 2.5 MG TABS tablet Take 1 tablet by mouth twice daily 180 tablet 1  . linaclotide (LINZESS) 72 MCG capsule Take 1 capsule (72 mcg total) by mouth daily before breakfast. 90 capsule 3   No current facility-administered medications for this visit.     Review of Systems Full ROS  was asked and was negative except for the information on the HPI  Physical Exam Blood pressure 110/77, pulse 67, temperature 99 F (37.2 C), temperature source Oral, height 5\' 5"   (1.651 m), weight 223 lb 3.2 oz (101.2 kg), SpO2 97 %. CONSTITUTIONAL: *NAD BMI 37 EYES: Pupils are equal, round, and reactive to light, Sclera are non-icteric. EARS, NOSE, MOUTH AND THROAT: The oropharynx is clear. The oral mucosa is pink and moist. Hearing is intact to voice. LYMPH NODES:  Lymph nodes in the neck are normal. RESPIRATORY:  Lungs are clear. There is normal respiratory effort, with equal breath sounds bilaterally, and without pathologic use of accessory muscles. CARDIOVASCULAR: Heart is regular without murmurs, gallops, or rubs. GI: The abdomen is soft, nontender, and nondistended. There are no palpable masses. There is no hepatosplenomegaly. There are normal bowel sounds in all quadrants. GU: Rectal deferred.   MUSCULOSKELETAL: Normal muscle strength and tone. No cyanosis or edema.   SKIN: Does have an evidence  of a raised lesion on the left arm lateral aspect.  This is indurated and measures 1.5 x 1 cm.  It is not definitively an abscess but can be a carbuncle On her right thigh it is difficult to palpate any masses but exam is limited due to her body habitus NEUROLOGIC: Motor and sensation is grossly normal. Cranial nerves are grossly intact. PSYCH:  Oriented to person, place and time. Affect is normal.  Data Reviewed  I have personally reviewed the patient's imaging, laboratory findings and medical records.    Assessment/Plan Right thigh edema versus soft tissue mass.  Given her body habitus is difficult to tell whether or not there is a true mass.  Because of this I do think that the next step would be to obtain imaging studies.  CT scan of the pelvis will be ordered. SHe does have left arm carbuncle and right now it does not seem to be an abscess.  I would like to see her next week to follow her clinically.  There is no evidence of necrotizing infection and she does not need any emergent surgical intervention Time spent with the patient was 45 minutes, with more than 50%  of the time spent in face-to-face education, counseling and care coordination.     Caroleen Hamman, MD FACS General Surgeon 09/28/2020, 4:00 PM

## 2020-10-03 ENCOUNTER — Other Ambulatory Visit: Payer: Self-pay

## 2020-10-03 ENCOUNTER — Encounter: Payer: Self-pay | Admitting: Internal Medicine

## 2020-10-03 ENCOUNTER — Telehealth (INDEPENDENT_AMBULATORY_CARE_PROVIDER_SITE_OTHER): Payer: Medicare HMO | Admitting: Internal Medicine

## 2020-10-03 ENCOUNTER — Ambulatory Visit (INDEPENDENT_AMBULATORY_CARE_PROVIDER_SITE_OTHER): Payer: Medicare HMO | Admitting: Surgery

## 2020-10-03 ENCOUNTER — Encounter: Payer: Self-pay | Admitting: Surgery

## 2020-10-03 VITALS — BP 122/81 | HR 70 | Temp 98.1°F | Ht 65.0 in | Wt 222.0 lb

## 2020-10-03 VITALS — BP 135/69 | HR 58 | Ht 65.0 in | Wt 227.8 lb

## 2020-10-03 DIAGNOSIS — L02434 Carbuncle of left upper limb: Secondary | ICD-10-CM

## 2020-10-03 DIAGNOSIS — H5789 Other specified disorders of eye and adnexa: Secondary | ICD-10-CM | POA: Diagnosis not present

## 2020-10-03 DIAGNOSIS — J309 Allergic rhinitis, unspecified: Secondary | ICD-10-CM | POA: Diagnosis not present

## 2020-10-03 DIAGNOSIS — R928 Other abnormal and inconclusive findings on diagnostic imaging of breast: Secondary | ICD-10-CM

## 2020-10-03 DIAGNOSIS — J0121 Acute recurrent ethmoidal sinusitis: Secondary | ICD-10-CM

## 2020-10-03 NOTE — Progress Notes (Signed)
Onset of symptoms last week. Still having issues with both eyes.  Sneezing, itching eyes, throat itching, eyes burning, congestion.

## 2020-10-03 NOTE — Progress Notes (Signed)
Virtual Visit via Video Note  I connected with [name]  on 10/03/20 at  7:40 AM EST by a video enabled telemedicine application and verified that I am speaking with the correct person using two identifiers.  Location patient: home, [place] Location provider:work or home office Persons participating in the virtual visit: patient, provider  I discussed the limitations of evaluation and management by telemedicine and the availability of in person appointments. The patient expressed understanding and agreed to proceed.   HPI:  09/21/20 seen swollen eyes r>left on xyzal, singulair, pataday, bactroban to nose never been on allergy shots or had testing, h/o allergies/asthma. She will f/u eye md end of 09/2020 AE Dr. Merla Riches. Eyes are swollen still L>R but both tried lid allergy scrub and cool compress but eyes still itching and will not go down until the night. She has had increased sneezing  ROS: See pertinent positives and negatives per HPI.  Past Medical History:  Diagnosis Date  . Allergy   . Anemia   . Arthritis   . Asthma   . Bacterial vaginitis   . Blood in stool   . Brachial neuritis or radiculitis NOS   . Brain tumor (benign) ([place]) 07/2017   near optic nerve. being followed by neurosurgery/eye doctor and pcp. monitoring size.causes sinus problems  . Bronchitis   . Cardiac arrhythmia   . Cervical neck pain with evidence of disc disease    C5/6 disease, MRI done late 2012 - no records available  . Chronic combined systolic and diastolic CHF (congestive heart failure) ([place])    a. 08/2010 Echo: mildly reduced EF 40-45%, mild diffuse hypokinesis; b. 06/2016 Echo: EF 50%, no rwma, mild to mod TR; c. 03/2017 Echo: EF 40-45%, Gr1 DD (prior echo reviewed and EF felt to be lower than reported).  . Chronic pain   . Cocaine abuse, in remission ([place])    clean x 24 years  . Colon polyps   . COPD (chronic obstructive pulmonary disease) ([place])    a. 12/2018 PFT: No obvious obst/restrictive  dzs.  . Coronary artery disease    a. PCI of LCX 2003; b. PCI of the LAD 2012 with a (2.5 x 8 mm BMS);  c.s/p CABG 4/12: L-LAD, VG-Dx, VG-OM, VG-RCA (Dr. Prescott Gum);  d. 01/2012 MV: inf infarct, attenuation, no ischemia; e. 02/2017 MV: signifi attenuation artifact. Fixed basal antlat/inflat scar vs artifact. Reversible apical lat and mid antlat defect - ? atten vs ischemia. F/u echo w/o wma->Med rx.  . Depression   . Diabetes mellitus without complication ([place])   . Family history of colon cancer   . Generalized headaches    frequent  . GERD (gastroesophageal reflux disease)   . Headache   . Heart murmur   . Hematochezia   . Hepatitis    history of hepatitis b  . History of cervical cancer    s/p cryotherapy  . History of drug abuse ([place])    cocaine, marijuana, clean since 1989  . History of hepatitis B    from eating undercooked liver  . History of MI (myocardial infarction)   . Hyperlipidemia   . Hypertension   . Ischemic cardiomyopathy    a. 03/2017 Echo: EF 40-45%, Gr1 DD.  [name] Myocardial infarction [facility]) 2003, 2012  . PAD (peripheral artery disease) ([place])    a. s/p Right SFA atherectomy and PTA 01/15/11; b. 07/2018 ABI: R 1.02, L 1.09.  [name] Pituitary mass ([place])    a. 12/2018 MRI Brain: Stable  pituitary mass w/ 25mm area of necrosis. Mass abuts R cavernous sinus w/o definite invasion.  . Polyp of colon   . Seasonal allergies   . Sleep apnea    uses cpap  . Smoking history    quit 07/2010  . Thyroid disease   . Urinary incontinence     Past Surgical History:  Procedure Laterality Date  . ABDOMINAL HYSTERECTOMY     2003  . CHOLECYSTECTOMY  1986  . COLONOSCOPY  2008   3 polyps  . COLONOSCOPY WITH PROPOFOL N/A 02/06/2015   Procedure: COLONOSCOPY WITH PROPOFOL;  Surgeon: Lucilla Lame, MD;  Location: [facility] ENDOSCOPY;  Service: Endoscopy;  Laterality: N/A;  . COLONOSCOPY WITH PROPOFOL N/A 01/27/2017   Procedure: COLONOSCOPY WITH PROPOFOL;  Surgeon: Lucilla Lame, MD;  Location[facility]  ENDOSCOPY;  Service: Endoscopy;  Laterality: N/A;  . CORONARY ANGIOPLASTY     w/ stent placement x2  . CORONARY ARTERY BYPASS GRAFT  2012   Dr [name]  . CORONARY STENT PLACEMENT  2003   S/P MI  . CORONARY STENT PLACEMENT  2007   [place]  . ESOPHAGOGASTRODUODENOSCOPY (EGD) WITH PROPOFOL N/A 02/06/2015   Procedure: ESOPHAGOGASTRODUODENOSCOPY (EGD) WITH PROPOFOL;  Surgeon: Lucilla Lame, MD;  Location: [facility] ENDOSCOPY;  Service: Endoscopy;  Laterality: N/A;  . ESOPHAGOGASTRODUODENOSCOPY (EGD) WITH PROPOFOL N/A 01/27/2017   Procedure: ESOPHAGOGASTRODUODENOSCOPY (EGD) WITH PROPOFOL;  Surgeon: Lucilla Lame, MD;  Location: [facility] ENDOSCOPY;  Service: Endoscopy;  Laterality: N/A;  . ESOPHAGOGASTRODUODENOSCOPY (EGD) WITH PROPOFOL N/A 09/01/2019   Procedure: ESOPHAGOGASTRODUODENOSCOPY (EGD) WITH PROPOFOL;  Surgeon: Lin Landsman, MD;  Location: [facility] ENDOSCOPY;  Service: Gastroenterology;  Laterality: N/A;  . FEMORAL ARTERY STENT  10/2010   right sided (Dr. Burt Knack)  . KNEE ARTHROSCOPY WITH MEDIAL MENISECTOMY Right 08/20/2017   Procedure: KNEE ARTHROSCOPY WITH MEDIAL  AND LATERAL MENISECTOMY;  Surgeon: Hessie Knows, MD;  Location: [facility] ORS;  Service: Orthopedics;  Laterality: Right;  . POSTERIOR CERVICAL LAMINECTOMY N/A 03/08/2018   Procedure: POSTERIOR CERVICAL LAMINECTOMY-C7;  Surgeon: Deetta Perla, MD;  Location: [facility] ORS;  Service: Neurosurgery;  Laterality: N/A;  . TONSILLECTOMY    . TOTAL KNEE ARTHROPLASTY Right 04/14/2019   Procedure: RIGHT TOTAL KNEE ARTHROPLASTY;  Surgeon: Hessie Knows, MD;  Location: [facility] ORS;  Service: Orthopedics;  Laterality: Right;  . TUBAL LIGATION       Current Outpatient Medications:  .  albuterol (VENTOLIN HFA) 108 (90 Base) MCG/ACT inhaler, Inhale 2 puffs into the lungs every 6 (six) hours as needed for wheezing or shortness of breath., Disp: 1 g, Rfl: 12 .  aspirin EC 81 MG tablet, Take 1 tablet (81 mg total) by mouth daily., Disp: 90 tablet, Rfl: 3 .  Azelastine HCl  0.15 % SOLN, Place 2 sprays into both nostrils daily as needed (allergies). , Disp: , Rfl:  .  cyanocobalamin (,VITAMIN B-12,) 1000 MCG/ML injection, Inject 1 mL (1,000 mcg total) into the muscle every 30 (thirty) days., Disp: 4 mL, Rfl: 3 .  cyclobenzaprine (FLEXERIL) 5 MG tablet, Take 1-2 tablets (5-10 mg total) by mouth at bedtime as needed for muscle spasms., Disp: 60 tablet, Rfl: 11 .  dicyclomine (BENTYL) 10 MG capsule, Take 1 capsule (10 mg total) by mouth 3 (three) times daily before meals. As needed for abdominal pain, Disp: 120 capsule, Rfl: 11 .  esomeprazole (NEXIUM) 40 MG capsule, Take 1 capsule (40 mg total) by mouth daily. Reported on 09/04/2015, Disp: 90 capsule, Rfl: 3 .  ezetimibe (ZETIA) 10 MG tablet, Take 1 tablet by  mouth once daily, Disp: 90 tablet, Rfl: 3 .  fluticasone (FLONASE) 50 MCG/ACT nasal spray, Place 2 sprays into both nostrils daily as needed for allergies. Max dose 2 sprays each nostril, Disp: 16 g, Rfl: 11 .  Fluticasone-Umeclidin-Vilant (TRELEGY ELLIPTA) 100-62.5-25 MCG/INH AEPB, Inhale 1 puff into the lungs daily., Disp: 28 each, Rfl: 5 .  furosemide (LASIX) 20 MG tablet, TAKE 1 TABLET BY MOUTH ONCE DAILY AND A SECOND TABLET IF NEEDED FOR FLUID BUILD UP, Disp: 180 tablet, Rfl: 2 .  glucose blood (ONE TOUCH ULTRA TEST) test strip, Use as instructed check cbg qd E11.9, Disp: 100 each, Rfl: 12 .  hydrOXYzine (ATARAX/VISTARIL) 25 MG tablet, Take 1-2 tablets (25-50 mg total) by mouth daily as needed., Disp: 60 tablet, Rfl: 2 .  ipratropium-albuterol (DUONEB) 0.5-2.5 (3) MG/3ML SOLN, Take 3 mLs by nebulization 2 (two) times daily as needed (wheezing/shortness of breath)., Disp: 360 mL, Rfl: 11 .  isosorbide mononitrate (IMDUR) 30 MG 24 hr tablet, Take 1 tablet by mouth twice daily (Patient taking differently: Take 30 mg by mouth daily.), Disp: 180 tablet, Rfl: 1 .  Lancets MISC, 1 Device by Does not apply route daily. Lancets E11.9, Disp: 90 each, Rfl: 3 .  levocetirizine  (XYZAL) 5 MG tablet, Take 1 tablet (5 mg total) by mouth at bedtime as needed for allergies., Disp: 90 tablet, Rfl: 3 .  linaclotide (LINZESS) 72 MCG capsule, Take 1 capsule (72 mcg total) by mouth daily before breakfast., Disp: 90 capsule, Rfl: 3 .  losartan (COZAAR) 25 MG tablet, Take 0.5 tablets (12.5 mg total) by mouth daily., Disp: 90 tablet, Rfl: 3 .  meclizine (ANTIVERT) 25 MG tablet, Take 1 tablet (25 mg total) by mouth 2 (two) times daily as needed for dizziness., Disp: 60 tablet, Rfl: 5 .  metoprolol succinate (TOPROL-XL) 50 MG 24 hr tablet, Take 1 tablet (50 mg total) by mouth daily. Take with or immediately following a meal., Disp: 90 tablet, Rfl: 3 .  mirabegron ER (MYRBETRIQ) 25 MG TB24 tablet, Take 1 tablet (25 mg total) by mouth daily., Disp: 30 tablet, Rfl: 11 .  montelukast (SINGULAIR) 10 MG tablet, Take 1 tablet (10 mg total) by mouth at bedtime., Disp: 90 tablet, Rfl: 3 .  mupirocin ointment (BACTROBAN) 2 %, Apply 1 application topically 2 (two) times daily. Both nostrils (nose) x 5 days, Disp: 30 g, Rfl: 0 .  NEEDLE, DISP, 25 G 25G X 1-1/2" MISC, 1 Device by Does not apply route every 30 (thirty) days., Disp: 4 each, Rfl: 3 .  olopatadine (PATANOL) 0.1 % ophthalmic solution, Place 1 drop into both eyes 2 (two) times daily., Disp: 5 mL, Rfl: 2 .  ondansetron (ZOFRAN) 4 MG tablet, Take 1 tablet (4 mg total) by mouth every 8 (eight) hours as needed for nausea or vomiting., Disp: 20 tablet, Rfl: 0 .  PARoxetine (PAXIL) 20 MG tablet, Take 1 tablet (20 mg total) by mouth daily., Disp: 90 tablet, Rfl: 3 .  potassium chloride (KLOR-CON) 10 MEQ tablet, Take 1 tablet (10 mEq total) by mouth daily., Disp: 90 tablet, Rfl: 3 .  pregabalin (LYRICA) 75 MG capsule, Take by mouth., Disp: , Rfl:  .  REPATHA SURECLICK 622 MG/ML SOAJ, INJECT 1 PEN INTO THE SKIN EVERY 14 DAYS, Disp: 6 mL, Rfl: 0 .  sodium chloride (OCEAN) 0.65 % SOLN nasal spray, Place 2 sprays into both nostrils daily as needed for  congestion., Disp: 30 mL, Rfl: 11 .  traZODone (DESYREL) 50 MG  tablet, Take 0.5-1 tablets (25-50 mg total) by mouth at bedtime as needed for sleep., Disp: 90 tablet, Rfl: 3 .  XARELTO 2.5 MG TABS tablet, Take 1 tablet by mouth twice daily, Disp: 180 tablet, Rfl: 1 .  nitroGLYCERIN (NITROSTAT) 0.4 MG SL tablet, Place 1 tablet (0.4 mg total) under the tongue every 5 (five) minutes as needed. (Patient not taking: Reported on 10/03/2020), Disp: 25 tablet, Rfl: 6 .  rosuvastatin (CRESTOR) 20 MG tablet, Take 1 tablet (20 mg total) by mouth at bedtime. (Patient not taking: Reported on 10/03/2020), Disp: 90 tablet, Rfl: 3  EXAM:  VITALS per patient if applicable:  GENERAL: alert, oriented, appears well and in no acute distress  HEENT: atraumatic, conjunttiva clear, no obvious abnormalities on inspection of external nose and ears +swollen eyes  NECK: normal movements of the head and neck  LUNGS: on inspection no signs of respiratory distress, breathing rate appears normal, no obvious gross SOB, gasping or wheezing  CV: no obvious cyanosis  MS: moves all visible extremities without noticeable abnormality  PSYCH/NEURO: pleasant and cooperative, no obvious depression or anxiety, speech and thought processing grossly intact  ASSESSMENT AND PLAN:  Discussed the following assessment and plan:  Allergic rhinitis, unspecified seasonality, unspecified trigger - Plan: Ambulatory referral to Allergy Dr. Orvil Feil allergic rhinitis/sinusitis worsening allergies with swollen eyes L>R  Consider allergy shots I.e dupixent  Consider allergy testing   xyzal, singulair, pataday, bactroban Cool compress to eyes  Start with benadryl 12.5 mg qhs and increase by increments of 12.5 until effective and f/u allergy   Acute recurrent ethmoidal sinusitis - Plan: Ambulatory referral to Allergy -consider ENT in the future   Eyes swollen - Plan: Ambulatory referral to Allergy  HM Flu shotutd Considershingrix in  futureat pharmacy pna 23 had 8/14/17consider in future  covid 2/2  pfizer  Tdap 05/05/2019 Consider prevnar in future  S/p hysterectomy no cervix and f/u OB/GYNh/o abnormal pap s/p cryo  -pap 05/08/15 neg papwill Repeat In 5 years   Colonoscopy Dr. Allen Norris 01/2017 polyps, gastritis, diverticulosis referred todayest with Dr. Marius Ditch 03/20/20   Mammogram3/18/2020 negativere orderedsch 09/01/19 left dx mammogram As of 3 or 10/28/19 utd benign cyst likely right breast rec [place] in 6 months to f/u ordered and sch 08/08/20 RECOMMENDATION: Bilateral diagnostic mammogram and RIGHT breast ultrasound in 3 months to ensure 1 year evaluation as per patient desire.  I have discussed the findings and recommendations with the patient. If applicable, a reminder letter will be sent to the patient regarding the next appointment.  BI-RADS CATEGORY  2: Benign. F/u breast surgery pending imaging   DEXAneg 10/13/2018  rec healthy diet and exercise   -we discussed possible serious and likely etiologies, options for evaluation and workup, limitations of telemedicine visit vs in person visit, treatment, treatment risks and precautions.     I discussed the assessment and treatment plan with the patient. The patient was provided an opportunity to ask questions and all were answered. The patient agreed with the plan and demonstrated an understanding of the instructions.    Time spent 20 min Delorise Jackson, MD

## 2020-10-03 NOTE — Patient Instructions (Signed)
You may leave the area open to air. Have your CT scan tomorrow. We will have you follow up in a couple of weeks.

## 2020-10-04 ENCOUNTER — Ambulatory Visit
Admission: RE | Admit: 2020-10-04 | Discharge: 2020-10-04 | Disposition: A | Discharge status: Home / Self Care | Payer: Medicare HMO | Source: Ambulatory Visit | Attending: Surgery | Admitting: Surgery

## 2020-10-04 ENCOUNTER — Other Ambulatory Visit: Payer: Self-pay

## 2020-10-04 ENCOUNTER — Ambulatory Visit: Payer: Medicare HMO

## 2020-10-04 DIAGNOSIS — D179 Benign lipomatous neoplasm, unspecified: Secondary | ICD-10-CM | POA: Diagnosis not present

## 2020-10-04 DIAGNOSIS — M25461 Effusion, right knee: Secondary | ICD-10-CM | POA: Diagnosis not present

## 2020-10-04 LAB — POCT I-STAT CREATININE: Creatinine, Ser: 1.1 mg/dL — ABNORMAL HIGH (ref 0.44–1.00)

## 2020-10-04 MED ORDER — IOHEXOL 300 MG/ML  SOLN
100.0000 mL | Freq: Once | INTRAMUSCULAR | Status: AC | PRN
  Administered 2020-10-04: 100 mL via INTRAVENOUS

## 2020-10-05 ENCOUNTER — Telehealth: Payer: Self-pay

## 2020-10-05 NOTE — Telephone Encounter (Signed)
Spoke with the patient and per Dr Dahlia Byes the CT scan did not show any concerning masses. He will discuss more with her at follow up.

## 2020-10-07 ENCOUNTER — Encounter: Payer: Self-pay | Admitting: Surgery

## 2020-10-07 NOTE — Progress Notes (Signed)
Outpatient Surgical Follow Up  10/07/2020  [name] is an 60 y.o. female.   Chief Complaint  Patient presents with  . Follow-up    HPI: [name] is a 60 year old female well-known to me following for carbuncle on the left arm.  He has done well and has responded appropriately to antibiotics.  No fevers no chills no drainage.  Pain has improved significantly.  [name] also had a questionable right thigh mass and is getting a CT scan of the thigh later this week.  [name] is otherwise back to normal and is nontoxic.  Past Medical History:  Diagnosis Date  . Allergy   . Anemia   . Arthritis   . Asthma   . Bacterial vaginitis   . Blood in stool   . Brachial neuritis or radiculitis NOS   . Brain tumor (benign) ([place]) 07/2017   near optic nerve. being followed by neurosurgery/eye doctor and pcp. monitoring size.causes sinus problems  . Bronchitis   . Cardiac arrhythmia   . Cervical neck pain with evidence of disc disease    C5/6 disease, MRI done late 2012 - no records available  . Chronic combined systolic and diastolic CHF (congestive heart failure) ([place])    a. 08/2010 Echo: mildly reduced EF 40-45%, mild diffuse hypokinesis; b. 06/2016 Echo: EF 50%, no rwma, mild to mod TR; c. 03/2017 Echo: EF 40-45%, Gr1 DD (prior echo reviewed and EF felt to be lower than reported).  . Chronic pain   . Cocaine abuse, in remission ([place])    clean x 24 years  . Colon polyps   . COPD (chronic obstructive pulmonary disease) ([place])    a. 12/2018 PFT: No obvious obst/restrictive dzs.  . Coronary artery disease    a. PCI of LCX 2003; b. PCI of the LAD 2012 with a (2.5 x 8 mm BMS);  c.s/p CABG 4/12: L-LAD, VG-Dx, VG-OM, VG-RCA (Dr. Prescott Gum);  d. 01/2012 MV: inf infarct, attenuation, no ischemia; e. 02/2017 MV: signifi attenuation artifact. Fixed basal antlat/inflat scar vs artifact. Reversible apical lat and mid antlat defect - ? atten vs ischemia. F/u echo w/o wma->Med rx.  . Depression   . Diabetes mellitus  without complication ([place])   . Family history of colon cancer   . Generalized headaches    frequent  . GERD (gastroesophageal reflux disease)   . Headache   . Heart murmur   . Hematochezia   . Hepatitis    history of hepatitis b  . History of cervical cancer    s/p cryotherapy  . History of drug abuse ([place])    cocaine, marijuana, clean since 1989  . History of hepatitis B    from eating undercooked liver  . History of MI (myocardial infarction)   . Hyperlipidemia   . Hypertension   . Ischemic cardiomyopathy    a. 03/2017 Echo: EF 40-45%, Gr1 DD.  [name] Myocardial infarction [facility]) 2003, 2012  . PAD (peripheral artery disease) ([place])    a. s/p Right SFA atherectomy and PTA 01/15/11; b. 07/2018 ABI: R 1.02, L 1.09.  [name] Pituitary mass ([place])    a. 12/2018 MRI Brain: Stable pituitary mass w/ 46mm area of necrosis. Mass abuts R cavernous sinus w/o definite invasion.  . Polyp of colon   . Seasonal allergies   . Sleep apnea    uses cpap  . Smoking history    quit 07/2010  . Thyroid disease   . Urinary incontinence     Past Surgical History:  Procedure Laterality Date  . ABDOMINAL HYSTERECTOMY     2003  . CHOLECYSTECTOMY  1986  . COLONOSCOPY  2008   3 polyps  . COLONOSCOPY WITH PROPOFOL N/A 02/06/2015   Procedure: COLONOSCOPY WITH PROPOFOL;  Surgeon: Lucilla Lame, MD;  Location: [facility] ENDOSCOPY;  Service: Endoscopy;  Laterality: N/A;  . COLONOSCOPY WITH PROPOFOL N/A 01/27/2017   Procedure: COLONOSCOPY WITH PROPOFOL;  Surgeon: Lucilla Lame, MD;  Location: [facility] ENDOSCOPY;  Service: Endoscopy;  Laterality: N/A;  . CORONARY ANGIOPLASTY     w/ stent placement x2  . CORONARY ARTERY BYPASS GRAFT  2012   Dr [name]  . CORONARY STENT PLACEMENT  2003   S/P MI  . CORONARY STENT PLACEMENT  2007   [place]  . ESOPHAGOGASTRODUODENOSCOPY (EGD) WITH PROPOFOL N/A 02/06/2015   Procedure: ESOPHAGOGASTRODUODENOSCOPY (EGD) WITH PROPOFOL;  Surgeon: Lucilla Lame, MD;  Location: [facility] ENDOSCOPY;  Service: Endoscopy;   Laterality: N/A;  . ESOPHAGOGASTRODUODENOSCOPY (EGD) WITH PROPOFOL N/A 01/27/2017   Procedure: ESOPHAGOGASTRODUODENOSCOPY (EGD) WITH PROPOFOL;  Surgeon: Lucilla Lame, MD;  Location: [facility] ENDOSCOPY;  Service: Endoscopy;  Laterality: N/A;  . ESOPHAGOGASTRODUODENOSCOPY (EGD) WITH PROPOFOL N/A 09/01/2019   Procedure: ESOPHAGOGASTRODUODENOSCOPY (EGD) WITH PROPOFOL;  Surgeon: Lin Landsman, MD;  Location: [facility] ENDOSCOPY;  Service: Gastroenterology;  Laterality: N/A;  . FEMORAL ARTERY STENT  10/2010   right sided (Dr. Burt Knack)  . KNEE ARTHROSCOPY WITH MEDIAL MENISECTOMY Right 08/20/2017   Procedure: KNEE ARTHROSCOPY WITH MEDIAL  AND LATERAL MENISECTOMY;  Surgeon: Hessie Knows, MD;  Location: [facility] ORS;  Service: Orthopedics;  Laterality: Right;  . POSTERIOR CERVICAL LAMINECTOMY N/A 03/08/2018   Procedure: POSTERIOR CERVICAL LAMINECTOMY-C7;  Surgeon: Deetta Perla, MD;  Location: [facility] ORS;  Service: Neurosurgery;  Laterality: N/A;  . TONSILLECTOMY    . TOTAL KNEE ARTHROPLASTY Right 04/14/2019   Procedure: RIGHT TOTAL KNEE ARTHROPLASTY;  Surgeon: Hessie Knows, MD;  Location: [facility] ORS;  Service: Orthopedics;  Laterality: Right;  . TUBAL LIGATION      Family History  Problem Relation Age of Onset  . Hypertension Father   . Heart failure Father   . Diabetes Father   . Colon cancer Father 60  . Glaucoma Father   . Cancer Father        colorectal   . Heart disease Father   . Other Father        glaucoma  . Breast cancer Mother 80       breast cancer, late 22's  . Cancer Mother        breast  . Brain cancer Sister   . Arthritis Sister   . Diabetes Sister   . Hypertension Sister   . Kidney disease Sister   . Diabetes Brother   . Hypertension Brother   . Other Sister        brain tumor   . Acute myelogenous leukemia Grandson        06/2019  . Coronary artery disease Neg Hx   . Stroke Neg Hx     Social History:  reports that [name] quit smoking about 10 years ago. Her smoking use included  cigarettes. [name] has a 38.00 pack-year smoking history. [name] has never used smokeless tobacco. [name] reports that [name] does not drink alcohol and does not use drugs.  Allergies:  Allergies  Allergen Reactions  . Latex Rash       . Shellfish Allergy Anaphylaxis    Hard shellfish/swelling in throat  . Sulfonamide Derivatives Anaphylaxis    Swelling in throat.  Cristopher [place] [  Citrullus Vulgaris] Other (See Comments)    Throat itchy  . Soy Allergy     Diarrhea/upset stomach    . Tomato Itching    Throat itchy - also Raw carrots  . Other Swelling    Medications reviewed.    ROS Full ROS performed and is otherwise negative other than what is stated in HPI   BP 122/81   Pulse 70   Temp 98.1 F (36.7 C)   Ht 5\' 5"  (1.651 m)   Wt 222 lb (100.7 kg)   BMI 36.94 kg/m   Physical Exam Vitals and nursing note reviewed. Exam conducted with a chaperone present.  Constitutional:      Appearance: Normal appearance. [name] is obese.  Pulmonary:     Effort: Pulmonary effort is normal.     Breath sounds: No stridor.  Skin:    General: Skin is warm and dry.     Capillary Refill: Capillary refill takes less than 2 seconds.     Comments: Resolving carbuncle left arm. No abscess no active infection  Neurological:     General: No focal deficit present.     Mental Status: [name] is alert and oriented to person, place, and time.  Psychiatric:        Mood and Affect: Mood normal.        Behavior: Behavior normal.        Thought Content: Thought content normal.        Judgment: Judgment normal.        Assessment/Plan:  Carbuncle on her left arm responded to antibiotic therapy.  No need for I&D or any intervention at this time.  We will follow her CT of the right thigh to make sure there is nothing that we need to be concerned biopsied or excised.  [name] understands.  No other questions at this time   Greater than 50% of the 15 minutes  visit was spent in counseling/coordination of  care   Caroleen Hamman, MD [facility] Surgeon

## 2020-10-08 ENCOUNTER — Telehealth: Payer: Self-pay | Admitting: Pulmonary Disease

## 2020-10-08 MED ORDER — MONTELUKAST SODIUM 10 MG PO TABS: 10.0000 mg | ORAL_TABLET | Freq: Every day | ORAL | 1 refills | Status: DC

## 2020-10-08 NOTE — Telephone Encounter (Signed)
Rx for Singulair has been sent to preferred pharmacy. Left detailed message for patient.  Nothing further needed at this time.

## 2020-10-11 ENCOUNTER — Ambulatory Visit: Payer: Medicare HMO

## 2020-10-15 DIAGNOSIS — J449 Chronic obstructive pulmonary disease, unspecified: Secondary | ICD-10-CM | POA: Diagnosis not present

## 2020-10-15 DIAGNOSIS — Z008 Encounter for other general examination: Secondary | ICD-10-CM | POA: Diagnosis not present

## 2020-10-15 DIAGNOSIS — E538 Deficiency of other specified B group vitamins: Secondary | ICD-10-CM | POA: Diagnosis not present

## 2020-10-15 DIAGNOSIS — E785 Hyperlipidemia, unspecified: Secondary | ICD-10-CM | POA: Diagnosis not present

## 2020-10-15 DIAGNOSIS — R69 Illness, unspecified: Secondary | ICD-10-CM | POA: Diagnosis not present

## 2020-10-15 DIAGNOSIS — E1151 Type 2 diabetes mellitus with diabetic peripheral angiopathy without gangrene: Secondary | ICD-10-CM | POA: Diagnosis not present

## 2020-10-15 DIAGNOSIS — I509 Heart failure, unspecified: Secondary | ICD-10-CM | POA: Diagnosis not present

## 2020-10-15 DIAGNOSIS — I25119 Atherosclerotic heart disease of native coronary artery with unspecified angina pectoris: Secondary | ICD-10-CM | POA: Diagnosis not present

## 2020-10-15 DIAGNOSIS — I110 Hypertensive heart disease with heart failure: Secondary | ICD-10-CM | POA: Diagnosis not present

## 2020-10-15 DIAGNOSIS — E1142 Type 2 diabetes mellitus with diabetic polyneuropathy: Secondary | ICD-10-CM | POA: Diagnosis not present

## 2020-10-16 ENCOUNTER — Ambulatory Visit (INDEPENDENT_AMBULATORY_CARE_PROVIDER_SITE_OTHER): Payer: Medicare HMO | Admitting: Internal Medicine

## 2020-10-16 ENCOUNTER — Ambulatory Visit (INDEPENDENT_AMBULATORY_CARE_PROVIDER_SITE_OTHER): Payer: Medicare HMO

## 2020-10-16 ENCOUNTER — Other Ambulatory Visit: Payer: Self-pay

## 2020-10-16 ENCOUNTER — Encounter: Payer: Self-pay | Admitting: Internal Medicine

## 2020-10-16 VITALS — BP 130/80 | Temp 98.2°F | Ht 65.0 in | Wt 225.0 lb

## 2020-10-16 DIAGNOSIS — S8991XA Unspecified injury of right lower leg, initial encounter: Secondary | ICD-10-CM | POA: Diagnosis not present

## 2020-10-16 DIAGNOSIS — M25561 Pain in right knee: Secondary | ICD-10-CM | POA: Diagnosis not present

## 2020-10-16 DIAGNOSIS — M25511 Pain in right shoulder: Secondary | ICD-10-CM

## 2020-10-16 DIAGNOSIS — L989 Disorder of the skin and subcutaneous tissue, unspecified: Secondary | ICD-10-CM

## 2020-10-16 DIAGNOSIS — M869 Osteomyelitis, unspecified: Secondary | ICD-10-CM

## 2020-10-16 DIAGNOSIS — R1030 Lower abdominal pain, unspecified: Secondary | ICD-10-CM

## 2020-10-16 DIAGNOSIS — S8001XA Contusion of right knee, initial encounter: Secondary | ICD-10-CM | POA: Diagnosis not present

## 2020-10-16 DIAGNOSIS — G8929 Other chronic pain: Secondary | ICD-10-CM | POA: Diagnosis not present

## 2020-10-16 DIAGNOSIS — E119 Type 2 diabetes mellitus without complications: Secondary | ICD-10-CM

## 2020-10-16 NOTE — Patient Instructions (Addendum)
[facility] [place]  [place] [phone]   [facility]  [address]  [place] [address]  [phone]   Shoulder Impingement Syndrome  Shoulder impingement syndrome is a condition that causes pain when connective tissues (tendons) surrounding the shoulder joint become pinched. These tendons are part of the group of muscles and tissues that help to stabilize the shoulder (rotator cuff). Beneath the rotator cuff is a fluid-filled sac (bursa) that allows the muscles and tendons to glide smoothly. The bursa may become swollen or irritated (bursitis). Bursitis, swelling in the rotator cuff tendons, or both conditions can decrease how much space is under a bone in the shoulder joint (acromion), resulting in impingement. What are the causes? Shoulder impingement syndrome may be caused by bursitis or swelling of the rotator cuff tendons, which may result from:  Repetitive overhead arm movements.  Falling onto the shoulder.  Weakness in the shoulder muscles. What increases the risk? You may be more likely to develop this condition if you:  Play sports that involve throwing, such as baseball.  Participate in sports such as tennis, volleyball, and swimming.  Work as a Curator, Games developer, or Architect. Some people are also more likely to develop impingement syndrome because of the shape of their acromion bone. What are the signs or symptoms? The main symptom of this condition is pain on the front or side of the shoulder. The pain may:  Get worse when lifting or raising the arm.  Get worse at night.  Wake you up from sleeping.  Feel sharp when the shoulder is moved and then fade to an ache. Other symptoms may include:  Tenderness.  Stiffness.  Inability to raise the arm above shoulder level or behind the body.  Weakness. How is this diagnosed? This  condition may be diagnosed based on:  Your symptoms and medical history.  A physical exam.  Imaging tests, such as: ? X-rays. ? MRI. ? Ultrasound. How is this treated? This condition may be treated by:  Resting your shoulder and avoiding all activities that cause pain or put stress on the shoulder.  Icing your shoulder.  NSAIDs to help reduce pain and swelling.  One or more injections of medicines to numb the area and reduce inflammation.  Physical therapy.  Surgery. This may be needed if nonsurgical treatments have not helped. Surgery may involve repairing the rotator cuff, reshaping the acromion, or removing the bursa. Follow these instructions at home: Managing pain, stiffness, and swelling  If directed, put ice on the injured area. ? Put ice in a plastic bag. ? Place a towel between your skin and the bag. ? Leave the ice on for 20 minutes, 2-3 times a day.   Activity  Rest and return to your normal activities as told by your health care provider. Ask your health care provider what activities are safe for you.  Do exercises as told by your health care provider. General instructions  Do not use any products that contain nicotine or tobacco, such as cigarettes, e-cigarettes, and chewing tobacco. These can delay healing. If you need help quitting, ask your health care provider.  Ask your health care provider when it is safe for you to drive.  Take over-the-counter and prescription medicines only as told by your health care provider.  Keep all follow-up visits as told by your health care provider. This is important. How is this prevented?  Give your body time to rest between periods of activity.  Be safe and responsible while being active. This will help you avoid falls.  Maintain physical fitness, including strength and flexibility. Contact a health care provider if:  Your symptoms have not improved after 1-2 months of treatment and rest.  You cannot lift your  arm away from your body. Summary  Shoulder impingement syndrome is a condition that causes pain when connective tissues (tendons) surrounding the shoulder joint become pinched.  The main symptom of this condition is pain on the front or side of the shoulder.  This condition is usually treated with rest, ice, and pain medicines as needed. This information is not intended to replace advice given to you by your health care provider. Make sure you discuss any questions you have with your health care provider. Document Revised: 11/05/2018 Document Reviewed: 01/06/2018 Elsevier Patient Education  2021 [place].    Shoulder Range of Motion Exercises Shoulder range of motion (ROM) exercises are done to keep the shoulder moving freely or to increase movement. They are often recommended for people who have shoulder pain or stiffness or who are recovering from a shoulder surgery. Phase 1 exercises When you are able, do this exercise 1-2 times per day for 30-60 seconds in each direction, or as directed by your health care provider. Pendulum exercise To do this exercise while sitting: 1. Sit in a chair or at the edge of your bed with your feet flat on the floor. 2. Let your affected arm hang down in front of you over the edge of the bed or chair. 3. Relax your shoulder, arm, and hand. [place] your body so your arm gently swings in small circles. You can also use your unaffected arm to start the motion. 5. Repeat changing the direction of the circles, swinging your arm left and right, and swinging your arm forward and back. To do this exercise while standing: 1. Stand next to a sturdy chair or table, and hold on to it with your hand on your unaffected side. 2. Bend forward at the waist. 3. Bend your knees slightly. 4. Relax your shoulder, arm, and hand. 5. While keeping your shoulder relaxed, use body motion to swing your arm in small circles. 6. Repeat changing the direction of the circles,  swinging your arm left and right, and swinging your arm forward and back. 7. Between exercises, stand up tall and take a short break to relax your lower back.   Phase 2 exercises Do these exercises 1-2 times per day or as told by your health care provider. Hold each stretch for 30 seconds, and repeat 3 times. Do the exercises with one or both arms as instructed by your health care provider. For these exercises, sit at a table with your hand and arm supported by the table. A chair that slides easily or has wheels can be helpful. External rotation 1. Turn your chair so that your affected side is nearest to the table. 2. Place your forearm on the table to your side. Bend your elbow about 90 at the elbow (right angle) and place your hand palm facing down on the table. Your elbow should be about 6 inches away from your side. 3. Keeping your arm on the table, lean your body forward. Abduction 1. Turn your chair so that your affected side is nearest to the table. 2. Place your forearm and hand on the table so that your thumb points toward the ceiling and your  arm is straight out to your side. 3. Slide your hand out to the side and away from you, using your unaffected arm to do the work. 4. To increase the stretch, you can slide your chair away from the table. Flexion: forward stretch 1. Sit facing the table. Place your hand and elbow on the table in front of you. 2. Slide your hand forward and away from you, using your unaffected arm to do the work. 3. To increase the stretch, you can slide your chair backward. Phase 3 exercises Do these exercises 1-2 times per day or as told by your health care provider. Hold each stretch for 30 seconds, and repeat 3 times. Do the exercises with one or both arms as instructed by your health care provider. Cross-body stretch: posterior capsule stretch 1. Lift your arm straight out in front of you. 2. Bend your arm 90 at the elbow (right angle) so your forearm moves  across your body. 3. Use your other arm to gently pull the elbow across your body, toward your other shoulder. Wall climbs 1. Stand with your affected arm extended out to the side with your hand resting on a door frame. 2. Slide your hand slowly up the door frame. 3. To increase the stretch, step through the door frame. Keep your body upright and do not lean. Wand exercises You will need a cane, a piece of PVC pipe, or a sturdy wooden dowel for wand exercises. Flexion To do this exercise while standing: 1. Hold the wand with both of your hands, palms down. 2. Using the other arm to help, lift your arms up and over your head, if able. 3. Push upward with your other arm to gently increase the stretch. To do this exercise while lying down: 1. Lie on your back with your elbows resting on the floor and the wand in both your hands. Your hands will be palm down, or pointing toward your feet. 2. Lift your hands toward the ceiling, using your unaffected arm to help if needed. 3. Bring your arms overhead as able, using your unaffected arm to help if needed. Internal rotation 1. Stand while holding the wand behind you with both hands. Your unaffected arm should be extended above your head with the arm of the affected side extended behind you at the level of your waist. The wand should be pointing straight up and down as you hold it. 2. Slowly pull the wand up behind your back by straightening the elbow of your unaffected arm and bending the elbow of your affected arm. External rotation 1. Lie on your back with your affected upper arm supported on a small pillow or rolled towel. When you first do this exercise, keep your upper arm close to your body. Over time, bring your arm up to a 90 angle out to the side. 2. Hold the wand across your stomach and with both hands palm up. Your elbow on your affected side should be bent at a 90 angle. 3. Use your unaffected side to help push your forearm away from you  and toward the floor. Keep your elbow on your affected side bent at a 90 angle. Contact a health care provider if you have:  New or increasing pain.  New numbness, tingling, weakness, or discoloration in your arm or hand. This information is not intended to replace advice given to you by your health care provider. Make sure you discuss any questions you have with your health care provider. Document Revised:  08/26/2017 Document Reviewed: 08/26/2017 Elsevier Patient Education  2021 [place].  Shoulder Exercises Ask your health care provider which exercises are safe for you. Do exercises exactly as told by your health care provider and adjust them as directed. It is normal to feel mild stretching, pulling, tightness, or discomfort as you do these exercises. Stop right away if you feel sudden pain or your pain gets worse. Do not begin these exercises until told by your health care provider. Stretching exercises External rotation and abduction This exercise is sometimes called corner stretch. This exercise rotates your arm outward (external rotation) and moves your arm out from your body (abduction). 6. Stand in a doorway with one of your feet slightly in front of the other. This is called a staggered stance. If you cannot reach your forearms to the door frame, stand facing a corner of a room. 7. Choose one of the following positions as told by your health care provider: ? Place your hands and forearms on the door frame above your head. ? Place your hands and forearms on the door frame at the height of your head. ? Place your hands on the door frame at the height of your elbows. 8. Slowly move your weight onto your front foot until you feel a stretch across your chest and in the front of your shoulders. Keep your head and chest upright and keep your abdominal muscles tight. 9. Hold for __________ seconds. 10. To release the stretch, shift your weight to your back foot. Repeat __________  times. Complete this exercise __________ times a day.   Extension, standing 8. Stand and hold a broomstick, a cane, or a similar object behind your back. ? Your hands should be a little wider than shoulder width apart. ? Your palms should face away from your back. 9. Keeping your elbows straight and your shoulder muscles relaxed, move the stick away from your body until you feel a stretch in your shoulders (extension). ? Avoid shrugging your shoulders while you move the stick. Keep your shoulder blades tucked down toward the middle of your back. 10. Hold for __________ seconds. 11. Slowly return to the starting position. Repeat __________ times. Complete this exercise __________ times a day. Range-of-motion exercises Pendulum 4. Stand near a wall or a surface that you can hold onto for balance. 5. Bend at the waist and let your left / right arm hang straight down. Use your other arm to support you. Keep your back straight and do not lock your knees. 6. Relax your left / right arm and shoulder muscles, and move your hips and your trunk so your left / right arm swings freely. Your arm should swing because of the motion of your body, not because you are using your arm or shoulder muscles. 7. Keep moving your hips and trunk so your arm swings in the following directions, as told by your health care provider: ? Side to side. ? Forward and backward. ? In clockwise and counterclockwise circles. 8. Continue each motion for __________ seconds, or for as long as told by your health care provider. 9. Slowly return to the starting position. Repeat __________ times. Complete this exercise __________ times a day.   Shoulder flexion, standing 5. Stand and hold a broomstick, a cane, or a similar object. Place your hands a little more than shoulder width apart on the object. Your left / right hand should be palm up, and your other hand should be palm down. 6. Keep your elbow straight  and your shoulder muscles  relaxed. Push the stick up with your healthy arm to raise your left / right arm in front of your body, and then over your head until you feel a stretch in your shoulder (flexion). ? Avoid shrugging your shoulder while you raise your arm. Keep your shoulder blade tucked down toward the middle of your back. 7. Hold for __________ seconds. 8. Slowly return to the starting position. Repeat __________ times. Complete this exercise __________ times a day.   Shoulder abduction, standing 4. Stand and hold a broomstick, a cane, or a similar object. Place your hands a little more than shoulder width apart on the object. Your left / right hand should be palm up, and your other hand should be palm down. 5. Keep your elbow straight and your shoulder muscles relaxed. Push the object across your body toward your left / right side. Raise your left / right arm to the side of your body (abduction) until you feel a stretch in your shoulder. ? Do not raise your arm above shoulder height unless your health care provider tells you to do that. ? If directed, raise your arm over your head. ? Avoid shrugging your shoulder while you raise your arm. Keep your shoulder blade tucked down toward the middle of your back. 6. Hold for __________ seconds. 7. Slowly return to the starting position. Repeat __________ times. Complete this exercise __________ times a day. Internal rotation 4. Place your left / right hand behind your back, palm up. 5. Use your other hand to dangle an exercise band, a towel, or a similar object over your shoulder. Grasp the band with your left / right hand so you are holding on to both ends. 6. Gently pull up on the band until you feel a stretch in the front of your left / right shoulder. The movement of your arm toward the center of your body is called internal rotation. ? Avoid shrugging your shoulder while you raise your arm. Keep your shoulder blade tucked down toward the middle of your  back. 7. Hold for __________ seconds. 8. Release the stretch by letting go of the band and lowering your hands. Repeat __________ times. Complete this exercise __________ times a day.   Strengthening exercises External rotation 4. Sit in a stable chair without armrests. 5. Secure an exercise band to a stable object at elbow height on your left / right side. 6. Place a soft object, such as a folded towel or a small pillow, between your left / right upper arm and your body to move your elbow about 4 inches (10 cm) away from your side. 7. Hold the end of the exercise band so it is tight and there is no slack. 8. Keeping your elbow pressed against the soft object, slowly move your forearm out, away from your abdomen (external rotation). Keep your body steady so only your forearm moves. 9. Hold for __________ seconds. 10. Slowly return to the starting position. Repeat __________ times. Complete this exercise __________ times a day.   Shoulder abduction 4. Sit in a stable chair without armrests, or stand up. 5. Hold a __________ weight in your left / right hand, or hold an exercise band with both hands. 6. Start with your arms straight down and your left / right palm facing in, toward your body. 7. Slowly lift your left / right hand out to your side (abduction). Do not lift your hand above shoulder height unless your health care provider tells  you that this is safe. ? Keep your arms straight. ? Avoid shrugging your shoulder while you do this movement. Keep your shoulder blade tucked down toward the middle of your back. 8. Hold for __________ seconds. 9. Slowly lower your arm, and return to the starting position. Repeat __________ times. Complete this exercise __________ times a day.   Shoulder extension 4. Sit in a stable chair without armrests, or stand up. 5. Secure an exercise band to a stable object in front of you so it is at shoulder height. 6. Hold one end of the exercise band in each  hand. Your palms should face each other. 7. Straighten your elbows and lift your hands up to shoulder height. 8. Step back, away from the secured end of the exercise band, until the band is tight and there is no slack. 9. Squeeze your shoulder blades together as you pull your hands down to the sides of your thighs (extension). Stop when your hands are straight down by your sides. Do not let your hands go behind your body. 10. Hold for __________ seconds. 11. Slowly return to the starting position. Repeat __________ times. Complete this exercise __________ times a day. Shoulder row 3. Sit in a stable chair without armrests, or stand up. 4. Secure an exercise band to a stable object in front of you so it is at waist height. 5. Hold one end of the exercise band in each hand. Position your palms so that your thumbs are facing the ceiling (neutral position). 6. Bend each of your elbows to a 90-degree angle (right angle) and keep your upper arms at your sides. 7. Step back until the band is tight and there is no slack. 8. Slowly pull your elbows back behind you. 9. Hold for __________ seconds. 10. Slowly return to the starting position. Repeat __________ times. Complete this exercise __________ times a day. Shoulder press-ups 4. Sit in a stable chair that has armrests. Sit upright, with your feet flat on the floor. 5. Put your hands on the armrests so your elbows are bent and your fingers are pointing forward. Your hands should be about even with the sides of your body. 6. Push down on the armrests and use your arms to lift yourself off the chair. Straighten your elbows and lift yourself up as much as you comfortably can. ? Move your shoulder blades down, and avoid letting your shoulders move up toward your ears. ? Keep your feet on the ground. As you get stronger, your feet should support less of your body weight as you lift yourself up. 7. Hold for __________ seconds. 8. Slowly lower yourself  back into the chair. Repeat __________ times. Complete this exercise __________ times a day.   Wall push-ups 1. Stand so you are facing a stable wall. Your feet should be about one arm-length away from the wall. 2. Lean forward and place your palms on the wall at shoulder height. 3. Keep your feet flat on the floor as you bend your elbows and lean forward toward the wall. 4. Hold for __________ seconds. 5. Straighten your elbows to push yourself back to the starting position. Repeat __________ times. Complete this exercise __________ times a day.   This information is not intended to replace advice given to you by your health care provider. Make sure you discuss any questions you have with your health care provider. Document Revised: 11/05/2018 Document Reviewed: 08/13/2018 Elsevier Patient Education  2021 [employer].

## 2020-10-16 NOTE — Progress Notes (Signed)
Chief Complaint  Patient presents with   Follow-up   Shoulder Pain    Ongoing for 2 months in the right shoulder after a fall   F/u  1. Right shoulder pain after fall 2 months ago Xray done today and right knee pain, pain 10/10 nothing tried. Will refer to [facility] ortho Dorise Hiss PA. 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 There is no evidence of arthropathy or other focal bone abnormality. Soft tissues are unremarkable. There is cardiomegaly. The patient appears to be status post prior CABG. Aortic calcifications are noted.  IMPRESSION: No acute displaced fracture or dislocation.   Electronically Signed   By: Constance Holster M.D.   On: 10/17/2020 12:18 FINDINGS: The patient is status post prior total knee arthroplasty on the right. The hardware appears grossly intact. There is no evidence for a periprosthetic fracture. No significant joint effusion.  IMPRESSION: Negative.   Electronically Signed   By: Constance Holster M.D.   On: 10/17/2020 12:19  2. Left upper extremity prior cyst but now dark and hard and pt wants this out   Review of Systems  Constitutional: Negative for weight loss.  HENT: Negative for hearing loss.   Eyes: Negative for blurred vision.  Respiratory: Negative for shortness of breath.   Cardiovascular: Negative for chest pain.  Gastrointestinal: Negative for abdominal pain.  Musculoskeletal: Positive for falls and joint pain.  Skin:       Skin lesion left upper arm   Neurological: Negative for headaches.  Psychiatric/Behavioral: Negative for depression.   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 being  followed by neurosurgery/eye doctor and pcp. monitoring size.causes sinus problems   Bronchitis    Cardiac arrhythmia    Cervical neck pain with evidence of disc disease    C5/6 disease, MRI done late 2012 - no records available   Chronic combined systolic and diastolic CHF (congestive heart failure) ([place])    a. 08/2010 Echo: mildly reduced EF 40-45%, mild diffuse hypokinesis; b. 06/2016 Echo: EF 50%, no rwma, mild to mod TR; c. 03/2017 Echo: EF 40-45%, Gr1 DD (prior echo reviewed and EF felt to be lower than reported).   Chronic pain    Cocaine abuse, in remission (HCC)    clean x 24 years   Colon polyps    COPD (chronic obstructive pulmonary disease) ([place])    a. 12/2018 PFT: No obvious obst/restrictive dzs.   Coronary artery disease    a. PCI of LCX 2003; b. PCI of the LAD 2012 with a (2.5 x 8 mm BMS);  c.s/p CABG 4/12: L-LAD, VG-Dx, VG-OM, VG-RCA (Dr. Prescott Gum);  d. 01/2012 MV: inf infarct, attenuation, no ischemia; e. 02/2017 MV: signifi attenuation artifact. Fixed basal antlat/inflat scar vs artifact. Reversible apical lat and mid antlat defect - ? atten vs ischemia. F/u echo w/o wma->Med rx.   Depression    Diabetes mellitus without complication ([place])    Family history of colon cancer    Generalized headaches    frequent   GERD (gastroesophageal reflux disease)    Headache    Heart  murmur    Hematochezia    Hepatitis    history of hepatitis b   History of cervical cancer    s/p cryotherapy   History of drug abuse ([place])    cocaine, marijuana, clean since 1989   History of hepatitis B    from eating undercooked liver   History of MI (myocardial infarction)    Hyperlipidemia    Hypertension    Ischemic cardiomyopathy    a. 03/2017 Echo: EF 40-45%, Gr1 DD.   Myocardial infarction ([place]) 2003, 2012   PAD (peripheral artery disease) ([place])    a. s/p Right SFA atherectomy and PTA 01/15/11; b. 07/2018 ABI: R 1.02, L 1.09.   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 Mass abuts R cavernous sinus w/o definite invasion.   Polyp of colon    Seasonal allergies    Sleep apnea    uses cpap   Smoking history    quit 07/2010   Thyroid disease    Urinary incontinence    Past Surgical History:  Procedure Laterality Date   ABDOMINAL HYSTERECTOMY     2003   CHOLECYSTECTOMY  1986   COLONOSCOPY  2008   3 polyps   COLONOSCOPY WITH PROPOFOL N/A 02/06/2015   Procedure: COLONOSCOPY WITH PROPOFOL;  Surgeon: Lucilla Lame, MD;  Location: [facility] ENDOSCOPY;  Service: Endoscopy;  Laterality: N/A;   COLONOSCOPY WITH PROPOFOL N/A 01/27/2017   Procedure: COLONOSCOPY WITH PROPOFOL;  Surgeon: Lucilla Lame, MD;  Location: [facility] ENDOSCOPY;  Service: Endoscopy;  Laterality: N/A;   CORONARY ANGIOPLASTY     w/ stent placement x2   CORONARY ARTERY BYPASS GRAFT  2012   Dr [name]   CORONARY STENT PLACEMENT  2003   S/P MI   CORONARY STENT PLACEMENT  2007   [place]   ESOPHAGOGASTRODUODENOSCOPY (EGD) WITH PROPOFOL N/A 02/06/2015   Procedure: ESOPHAGOGASTRODUODENOSCOPY (EGD) WITH PROPOFOL;  Surgeon: Lucilla Lame, MD;  Location: [facility] ENDOSCOPY;  Service: Endoscopy;  Laterality: N/A;   ESOPHAGOGASTRODUODENOSCOPY (EGD) WITH PROPOFOL N/A 01/27/2017   Procedure: ESOPHAGOGASTRODUODENOSCOPY (EGD) WITH PROPOFOL;  Surgeon: Lucilla Lame, MD;  Location: [facility] ENDOSCOPY;  Service: Endoscopy;  Laterality: N/A;   ESOPHAGOGASTRODUODENOSCOPY (EGD) WITH PROPOFOL N/A 09/01/2019   Procedure: ESOPHAGOGASTRODUODENOSCOPY (EGD) WITH PROPOFOL;  Surgeon: Lin Landsman, MD;  Location: [place];  Service: Gastroenterology;  Laterality: N/A;   FEMORAL ARTERY STENT  10/2010   right sided (Dr. Burt Knack)   KNEE ARTHROSCOPY WITH MEDIAL MENISECTOMY Right 08/20/2017   Procedure: KNEE ARTHROSCOPY WITH MEDIAL  AND LATERAL MENISECTOMY;  Surgeon: Hessie Knows, MD;  Location: [facility] ORS;  Service: Orthopedics;  Laterality: Right;   POSTERIOR CERVICAL  LAMINECTOMY N/A 03/08/2018   Procedure: POSTERIOR CERVICAL LAMINECTOMY-C7;  Surgeon: Deetta Perla, MD;  Location: [facility] ORS;  Service: Neurosurgery;  Laterality: N/A;   TONSILLECTOMY     TOTAL KNEE ARTHROPLASTY Right 04/14/2019   Procedure: RIGHT TOTAL KNEE ARTHROPLASTY;  Surgeon: Hessie Knows, MD;  Location: [facility] ORS;  Service: Orthopedics;  Laterality: Right;   TUBAL LIGATION     Family History  Problem Relation Age of Onset   Hypertension Father    Heart failure Father    Diabetes Father    Colon cancer Father 47   Glaucoma Father    Cancer Father        colorectal    Heart disease Father    Other Father        glaucoma   Breast cancer Mother 77  breast cancer, late 46's   Cancer Mother        breast   Brain cancer Sister    Arthritis Sister    Diabetes Sister    Hypertension Sister    Kidney disease Sister    Diabetes Brother    Hypertension Brother    Other Sister        brain tumor    Acute myelogenous leukemia Grandson        06/2019   Coronary artery disease Neg Hx    Stroke Neg Hx    Social History   Socioeconomic History   Marital status: Legally Separated    Spouse name: Not on file   Number of children: Not on file   Years of education: Not on file   Highest education level: Not on file  Occupational History   Not on file  Tobacco Use   Smoking status: Former Smoker    Packs/day: 1.00    Years: 38.00    Pack years: 38.00    Types: Cigarettes    Quit date: 08/12/2010    Years since quitting: 10.1   Smokeless tobacco: Never Used  Vaping Use   Vaping Use: Never used  Substance and Sexual Activity   Alcohol use: No   Drug use: No    Types: Cocaine    Comment: Remote Hx (crack cocaine and marijuana).none since 60yrs plus   Sexual activity: Yes  Other Topics Concern   Not on file  Social History Narrative   Caffeine: 1 cup coffee/day   Lives with family, no pets   Occupation: industrial work, 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   Edu: 11th grade   Activity: no regular exercise   Diet: good water, vegetables daily, low salt diet   Lives with sister and other family    No guns, wears seat belts, safe in relationship    2 kids    GED 1 year of college    Social Determinants of Health   Financial Resource Strain: Low Risk    Difficulty of Paying Living Expenses: Not hard at all  Food Insecurity: No Food Insecurity   Worried About Charity fundraiser in the Last Year: Never true   Arboriculturist in the Last Year: Never true  Transportation Needs: No Transportation Needs   Lack of Transportation (Medical): No   Lack of Transportation (Non-Medical): No  Physical Activity: Unknown   Days of Exercise per Week: 0 days   Minutes of Exercise per Session: Not on file  Stress: No Stress Concern Present   Feeling of Stress : Not at all  Social Connections: Unknown   Frequency of Communication with Friends and Family: More than three times a week   Frequency of Social Gatherings with Friends and Family: Once a week   Attends Religious Services: 1 to 4 times per year   Active Member of [employer] or Organizations: Yes   Attends Music therapist: More than 4 times per year   Marital Status: Not on file  Intimate Partner Violence: Not At Risk   Fear of Current or Ex-Partner: No   Emotionally Abused: No   Physically Abused: No   Sexually Abused: No   Current Meds  Medication Sig   albuterol (VENTOLIN HFA) 108 (90 Base) MCG/ACT inhaler Inhale 2 puffs into the lungs every 6 (six) hours as needed for wheezing or shortness of breath.   aspirin EC 81 MG tablet Take 1 tablet (81 mg total)  by mouth daily.   Azelastine HCl 0.15 % SOLN Place 2 sprays into both nostrils daily as needed (allergies).    cyanocobalamin (,VITAMIN B-12,) 1000 MCG/ML injection Inject 1 mL (1,000 mcg total) into the muscle every 30 (thirty) days.   cyclobenzaprine (FLEXERIL) 5 MG tablet Take 1-2  tablets (5-10 mg total) by mouth at bedtime as needed for muscle spasms.   dicyclomine (BENTYL) 10 MG capsule Take 1 capsule (10 mg total) by mouth 3 (three) times daily before meals. As needed for abdominal pain   esomeprazole (NEXIUM) 40 MG capsule Take 1 capsule (40 mg total) by mouth daily. Reported on 09/04/2015   ezetimibe (ZETIA) 10 MG tablet Take 1 tablet by mouth once daily   fluticasone (FLONASE) 50 MCG/ACT nasal spray Place 2 sprays into both nostrils daily as needed for allergies. Max dose 2 sprays each nostril   Fluticasone-Umeclidin-Vilant (TRELEGY ELLIPTA) 100-62.5-25 MCG/INH AEPB Inhale 1 puff into the lungs daily.   furosemide (LASIX) 20 MG tablet TAKE 1 TABLET BY MOUTH ONCE DAILY AND A SECOND TABLET IF NEEDED FOR FLUID BUILD UP   glucose blood test strip Use as instructed. One touch verio flex   hydrOXYzine (ATARAX/VISTARIL) 25 MG tablet Take 1-2 tablets (25-50 mg total) by mouth daily as needed.   ipratropium-albuterol (DUONEB) 0.5-2.5 (3) MG/3ML SOLN Take 3 mLs by nebulization 2 (two) times daily as needed (wheezing/shortness of breath).   isosorbide mononitrate (IMDUR) 30 MG 24 hr tablet Take 1 tablet by mouth twice daily (Patient taking differently: Take 30 mg by mouth daily.)   Lancets (ONETOUCH DELICA PLUS [id number]) MISC 1 Device by Does not apply route in the morning and at bedtime.   levocetirizine (XYZAL) 5 MG tablet Take 1 tablet (5 mg total) by mouth at bedtime as needed for allergies.   losartan (COZAAR) 25 MG tablet Take 0.5 tablets (12.5 mg total) by mouth daily.   meclizine (ANTIVERT) 25 MG tablet Take 1 tablet (25 mg total) by mouth 2 (two) times daily as needed for dizziness.   metoprolol succinate (TOPROL-XL) 50 MG 24 hr tablet Take 1 tablet (50 mg total) by mouth daily. Take with or immediately following a meal.   mirabegron ER (MYRBETRIQ) 25 MG TB24 tablet Take 1 tablet (25 mg total) by mouth daily.   montelukast (SINGULAIR) 10 MG tablet Take 1  tablet (10 mg total) by mouth at bedtime.   mupirocin ointment (BACTROBAN) 2 % Apply 1 application topically 2 (two) times daily. Both nostrils (nose) x 5 days   NEEDLE, DISP, 25 G 25G X 1-1/2" MISC 1 Device by Does not apply route every 30 (thirty) days.   nitroGLYCERIN (NITROSTAT) 0.4 MG SL tablet Place 1 tablet (0.4 mg total) under the tongue every 5 (five) minutes as needed.   olopatadine (PATANOL) 0.1 % ophthalmic solution Place 1 drop into both eyes 2 (two) times daily.   ondansetron (ZOFRAN) 4 MG tablet Take 1 tablet (4 mg total) by mouth every 8 (eight) hours as needed for nausea or vomiting.   PARoxetine (PAXIL) 20 MG tablet Take 1 tablet (20 mg total) by mouth daily.   potassium chloride (KLOR-CON) 10 MEQ tablet Take 1 tablet (10 mEq total) by mouth daily.   pregabalin (LYRICA) 75 MG capsule Take by mouth.   REPATHA SURECLICK 381 MG/ML SOAJ INJECT 1 PEN INTO THE SKIN EVERY 14 DAYS   rosuvastatin (CRESTOR) 20 MG tablet Take 1 tablet (20 mg total) by mouth at bedtime.   sodium chloride (OCEAN) 0.65 %  SOLN nasal spray Place 2 sprays into both nostrils daily as needed for congestion.   traZODone (DESYREL) 50 MG tablet Take 0.5-1 tablets (25-50 mg total) by mouth at bedtime as needed for sleep.   XARELTO 2.5 MG TABS tablet Take 1 tablet by mouth twice daily   [DISCONTINUED] glucose blood (ONE TOUCH ULTRA TEST) test strip Use as instructed check cbg qd E11.9   [DISCONTINUED] Lancets MISC 1 Device by Does not apply route daily. Lancets E11.9   Allergies  Allergen Reactions   Latex Rash        Shellfish Allergy Anaphylaxis    Hard shellfish/swelling in throat   Sulfonamide Derivatives Anaphylaxis    Swelling in throat.   Watermelon [Citrullus Vulgaris] Other (See Comments)    Throat itchy   Soy Allergy     Diarrhea/upset stomach     Tomato Itching    Throat itchy - also Raw carrots   Other Swelling   Recent Results (from the past 2160 hour(s))   Comprehensive metabolic panel     Status: Abnormal   Collection Time: 07/24/20 11:53 AM  Result Value Ref Range   Sodium 142 135 - 145 mEq/L   Potassium 4.0 3.5 - 5.1 mEq/L   Chloride 109 96 - 112 mEq/L   CO2 28 19 - 32 mEq/L   Glucose, Bld 78 70 - 99 mg/dL   BUN 18 6 - 23 mg/dL   Creatinine, Ser 1.21 (H) 0.40 - 1.20 mg/dL   Total Bilirubin 0.3 0.2 - 1.2 mg/dL   Alkaline Phosphatase 83 39 - 117 U/L   AST 20 0 - 37 U/L   ALT 31 0 - 35 U/L   Total Protein 6.1 6.0 - 8.3 g/dL   Albumin 3.7 3.5 - 5.2 g/dL   GFR 48.23 (L) >60.00 mL/min    Comment: Calculated using the CKD-EPI Creatinine Equation (2021)   Calcium 8.8 8.4 - 10.5 mg/dL  Lipid panel     Status: Abnormal   Collection Time: 07/24/20 11:53 AM  Result Value Ref Range   Cholesterol 125 0 - 200 mg/dL    Comment: ATP III Classification       Desirable:  < 200 mg/dL               Borderline High:  200 - 239 mg/dL          High:  > = 240 mg/dL   Triglycerides 150.0 (H) 0.0 - 149.0 mg/dL    Comment: Normal:  <150 mg/dLBorderline High:  150 - 199 mg/dL   HDL 56.30 >39.00 mg/dL   VLDL 30.0 0.0 - 40.0 mg/dL   LDL Cholesterol 39 0 - 99 mg/dL   Total CHOL/HDL Ratio 2     Comment:                Men          Women1/2 Average Risk     3.4          3.3Average Risk          5.0          4.42X Average Risk          9.6          7.13X Average Risk          15.0          11.0  NonHDL 68.52     Comment: NOTE:  Non-HDL goal should be 30 mg/dL higher than patient's LDL goal (i.e. LDL goal of < 70 mg/dL, would have non-HDL goal of < 100 mg/dL)  Hemoglobin A1c     Status: None   Collection Time: 07/24/20 11:53 AM  Result Value Ref Range   Hgb A1c MFr Bld 6.5 4.6 - 6.5 %    Comment: Glycemic Control Guidelines for People with Diabetes:Non Diabetic:  <6%Goal of Therapy: <7%Additional Action Suggested:  >8%   CBC with Differential/Platelet     Status: Abnormal   Collection Time: 07/24/20 11:53 AM  Result Value Ref Range   WBC  6.1 4.0 - 10.5 K/uL   RBC 4.03 3.87 - 5.11 Mil/uL   Hemoglobin 12.6 12.0 - 15.0 g/dL   HCT 38.6 36.0 - 46.0 %   MCV 95.6 78.0 - 100.0 fl   MCHC 32.6 30.0 - 36.0 g/dL   RDW 14.0 11.5 - 15.5 %   Platelets 134.0 (L) 150.0 - 400.0 K/uL   Neutrophils Relative % 50.4 43.0 - 77.0 %   Lymphocytes Relative 39.5 12.0 - 46.0 %   Monocytes Relative 8.2 3.0 - 12.0 %   Eosinophils Relative 1.4 0.0 - 5.0 %   Basophils Relative 0.5 0.0 - 3.0 %   Neutro Abs 3.1 1.4 - 7.7 K/uL   Lymphs Abs 2.4 0.7 - 4.0 K/uL   Monocytes Absolute 0.5 0.1 - 1.0 K/uL   Eosinophils Absolute 0.1 0.0 - 0.7 K/uL   Basophils Absolute 0.0 0.0 - 0.1 K/uL  Vitamin D (25 hydroxy)     Status: Abnormal   Collection Time: 07/24/20 11:53 AM  Result Value Ref Range   VITD 22.21 (L) 30.00 - 100.00 ng/mL  TSH     Status: None   Collection Time: 07/24/20 11:53 AM  Result Value Ref Range   TSH 0.73 0.35 - 4.50 uIU/mL  Vitamin B12     Status: None   Collection Time: 07/24/20 11:53 AM  Result Value Ref Range   Vitamin B-12 236 211 - 911 pg/mL  Basic Metabolic Panel (BMET)     Status: Abnormal   Collection Time: 08/21/20 10:14 AM  Result Value Ref Range   Glucose, Bld 104 (H) 65 - 99 mg/dL    Comment: .            Fasting reference interval . For someone without known diabetes, a glucose value between 100 and 125 mg/dL is consistent with prediabetes and should be confirmed with a follow-up test. .    BUN 14 7 - 25 mg/dL   Creat 1.01 (H) 0.50 - 0.99 mg/dL    Comment: Verified by repeat analysis. . For patients >92 years of age, the reference limit for Creatinine is approximately 13% higher for people identified as African-American. .    BUN/Creatinine Ratio 14 6 - 22 (calc)   Sodium 143 135 - 146 mmol/L   Potassium 4.3 3.5 - 5.3 mmol/L   Chloride 110 98 - 110 mmol/L   CO2 25 20 - 32 mmol/L    Comment: Verified by repeat analysis. .    Calcium 9.6 8.6 - 10.4 mg/dL  Pathologist smear review     Status: None    Collection Time: 08/21/20 10:14 AM  Result Value Ref Range   Path Review      Comment: Myeloid population consists predominantly of mature segmented neutrophils with mild reactive changes. A few lymphocytes appear reactive. No immature cells are identified.  RBC are unremarkable. Platelet clumps noted on smear-count appears adequate. Reviewed by Francis Gaines Mammarappallil, MD  (Electronic Signature on File)     08/22/2020   CBC w/Diff     Status: None   Collection Time: 08/21/20 10:14 AM  Result Value Ref Range   WBC 5.2 3.8 - 10.8 Thousand/uL   RBC 4.50 3.80 - 5.10 Million/uL   Hemoglobin 14.3 11.7 - 15.5 g/dL   HCT 42.1 35.0 - 45.0 %   MCV 93.6 80.0 - 100.0 fL   MCH 31.8 27.0 - 33.0 pg   MCHC 34.0 32.0 - 36.0 g/dL   RDW 13.2 11.0 - 15.0 %   Platelets 152 140 - 400 Thousand/uL   MPV 11.4 7.5 - 12.5 fL   Neutro Abs 1,815 1,500 - 7,800 cells/uL   Lymphs Abs 2,839 850 - 3,900 cells/uL   Absolute Monocytes 447 200 - 950 cells/uL   Eosinophils Absolute 78 15 - 500 cells/uL   Basophils Absolute 21 0 - 200 cells/uL   Neutrophils Relative % 34.9 %   Total Lymphocyte 54.6 %   Monocytes Relative 8.6 %   Eosinophils Relative 1.5 %   Basophils Relative 0.4 %  I-STAT creatinine     Status: Abnormal   Collection Time: 10/04/20  1:12 PM  Result Value Ref Range   Creatinine, Ser 1.10 (H) 0.44 - 1.00 mg/dL   Objective  Body mass index is 37.44 kg/m. Wt Readings from Last 3 Encounters:  10/16/20 225 lb (102.1 kg)  10/03/20 222 lb (100.7 kg)  10/03/20 227 lb 12.8 oz (103.3 kg)   Temp Readings from Last 3 Encounters:  10/16/20 98.2 F (36.8 C) (Oral)  10/03/20 98.1 F (36.7 C)  09/24/20 99 F (37.2 C) (Oral)   BP Readings from Last 3 Encounters:  10/16/20 130/80  10/03/20 122/81  10/03/20 135/69   Pulse Readings from Last 3 Encounters:  10/03/20 70  10/03/20 (!) 58  09/24/20 67    Physical Exam Vitals and nursing note reviewed.  Constitutional:      Appearance: Normal  appearance. She is well-developed and well-groomed. She is obese.  HENT:     Head: Normocephalic and atraumatic.  Eyes:     Conjunctiva/sclera: Conjunctivae normal.     Pupils: Pupils are equal, round, and reactive to light.  Cardiovascular:     Rate and Rhythm: Normal rate and regular rhythm.     Heart sounds: Normal heart sounds. No murmur heard.   Pulmonary:     Effort: Pulmonary effort is normal.     Breath sounds: Normal breath sounds.  Skin:    General: Skin is warm and dry.       Neurological:     General: No focal deficit present.     Mental Status: She is alert and oriented to person, place, and time. Mental status is at baseline.     Gait: Gait normal.  Psychiatric:        Attention and Perception: Attention and perception normal.        Mood and Affect: Mood and affect normal.        Speech: Speech normal.        Behavior: Behavior normal. Behavior is cooperative.        Thought Content: Thought content normal.        Cognition and Memory: Cognition and memory normal.        Judgment: Judgment normal.     Assessment  Plan     Acute pain  of right shoulder - Plan: DG Shoulder Right, Ambulatory referral to Orthopedic Surgery [place] Chronic pain of right knee - Plan: DG Knee Complete 4 Views Right, Ambulatory referral to Orthopedic Surgery Inguinal pain, with h/o osteitis pubis - Plan: Ambulatory referral to Orthopedic Surgery Likely needs PT   Diabetes mellitus without complication ([place]) - Plan: Lancets (ONETOUCH DELICA PLUS [id number]) MISC, glucose blood test strip  Skin lesion of left arm  Messaged Dr. Dahlia Byes pt still wants LUE lesion removed and c/w right inner thigh lesion   HM Flu shotutd Considershingrix in futureat pharmacy pna 23 had 8/14/17consider in future  covid 2/2  pfizer consider booster Tdap 05/05/2019 Consider prevnar in future  S/p hysterectomy no cervix and f/u OB/GYNh/o abnormal pap s/p cryo  -pap 05/08/15 neg papwill Repeat In 5  years   Colonoscopy Dr. Allen Norris 01/2017 polyps, gastritis, diverticulosis referred todayest with Dr. Marius Ditch 03/20/20  Mammogram3/18/2020 negativere orderedsch 09/01/19 left dx mammogram As of 3 or 10/28/19 utd benign cyst likely right breast rec [place] in 6 months to f/u ordered and sch 08/08/20 RECOMMENDATION: Bilateral diagnostic mammogram and RIGHT breast ultrasound in 3 months to ensure 1 year evaluation as per patient desire.  I have discussed the findings and recommendations with the patient. If applicable, a reminder letter will be sent to the patient regarding the next appointment.  BI-RADS CATEGORY 2: Benign. F/u breast surgery pending imaging  DEXAneg 10/13/2018  rec healthy diet and exercise   Provider: Dr. Olivia Mackie [name]-Internal Medicine

## 2020-10-18 ENCOUNTER — Telehealth: Payer: Self-pay | Admitting: Internal Medicine

## 2020-10-18 MED ORDER — GLUCOSE BLOOD VI STRP: ORAL_STRIP | 3 refills | Status: DC

## 2020-10-18 MED ORDER — ONETOUCH DELICA PLUS LANCET33G MISC
1.0000 | Freq: Two times a day (BID) | 3 refills | Status: DC
Start: 2020-10-18 — End: 2021-09-17

## 2020-10-18 NOTE — Telephone Encounter (Signed)
-----   Message from Delorise Jackson, MD sent at 10/18/2020  8:04 AM EDT ----- Fax not [facility]  Pt having right knee, right shoulder pain after fall, osteitis pubis groin pain and there was right knee effusion on CT 10/05/20   Can you reach out to patient for appt?  Likely also needs PT

## 2020-10-18 NOTE — Telephone Encounter (Signed)
Faxed via epic routing

## 2020-10-24 ENCOUNTER — Other Ambulatory Visit: Payer: Self-pay | Admitting: Internal Medicine

## 2020-10-24 DIAGNOSIS — E119 Type 2 diabetes mellitus without complications: Secondary | ICD-10-CM

## 2020-10-25 ENCOUNTER — Ambulatory Visit: Payer: Medicare HMO

## 2020-10-25 ENCOUNTER — Telehealth: Payer: Self-pay | Admitting: Dietician

## 2020-10-25 ENCOUNTER — Other Ambulatory Visit: Payer: Self-pay | Admitting: Cardiovascular Disease

## 2020-10-25 NOTE — Telephone Encounter (Signed)
Called patient to reschedule her missed class from today. Left voicemail message with upcoming class dates and times, and requested a call back.

## 2020-10-29 ENCOUNTER — Telehealth: Payer: Self-pay | Admitting: *Deleted

## 2020-10-29 ENCOUNTER — Other Ambulatory Visit: Payer: Self-pay | Admitting: Pulmonary Disease

## 2020-10-29 NOTE — Telephone Encounter (Signed)
Received voice mail from patient that she wanted to reschedule her diabetes classes. Called her back and she is rescheduled for May 26 series.

## 2020-10-30 DIAGNOSIS — M7051 Other bursitis of knee, right knee: Secondary | ICD-10-CM | POA: Diagnosis not present

## 2020-10-30 DIAGNOSIS — M25512 Pain in left shoulder: Secondary | ICD-10-CM | POA: Diagnosis not present

## 2020-10-30 DIAGNOSIS — M62838 Other muscle spasm: Secondary | ICD-10-CM | POA: Diagnosis not present

## 2020-10-30 DIAGNOSIS — M25561 Pain in right knee: Secondary | ICD-10-CM | POA: Diagnosis not present

## 2020-10-30 DIAGNOSIS — Z96651 Presence of right artificial knee joint: Secondary | ICD-10-CM | POA: Diagnosis not present

## 2020-10-31 ENCOUNTER — Ambulatory Visit: Payer: Medicare HMO | Admitting: Surgery

## 2020-11-01 ENCOUNTER — Ambulatory Visit: Payer: Medicare HMO

## 2020-11-01 ENCOUNTER — Other Ambulatory Visit: Payer: Self-pay

## 2020-11-01 ENCOUNTER — Other Ambulatory Visit (HOSPITAL_COMMUNITY): Payer: Self-pay | Admitting: Cardiovascular Disease

## 2020-11-01 ENCOUNTER — Ambulatory Visit (HOSPITAL_COMMUNITY)
Admission: RE | Admit: 2020-11-01 | Discharge: 2020-11-01 | Disposition: A | Discharge status: Home / Self Care | Payer: Medicare HMO | Source: Ambulatory Visit | Attending: Internal Medicine | Admitting: Internal Medicine

## 2020-11-01 DIAGNOSIS — I739 Peripheral vascular disease, unspecified: Secondary | ICD-10-CM

## 2020-11-01 DIAGNOSIS — Z959 Presence of cardiac and vascular implant and graft, unspecified: Secondary | ICD-10-CM

## 2020-11-01 DIAGNOSIS — Z9862 Peripheral vascular angioplasty status: Secondary | ICD-10-CM

## 2020-11-04 ENCOUNTER — Other Ambulatory Visit: Payer: Self-pay | Admitting: Internal Medicine

## 2020-11-04 DIAGNOSIS — E876 Hypokalemia: Secondary | ICD-10-CM

## 2020-11-05 ENCOUNTER — Ambulatory Visit: Payer: Medicare HMO | Admitting: Surgery

## 2020-11-05 DIAGNOSIS — G4733 Obstructive sleep apnea (adult) (pediatric): Secondary | ICD-10-CM | POA: Diagnosis not present

## 2020-11-07 ENCOUNTER — Ambulatory Visit
Admission: RE | Admit: 2020-11-07 | Discharge: 2020-11-07 | Disposition: A | Discharge status: Home / Self Care | Payer: Medicare HMO | Source: Ambulatory Visit | Attending: Internal Medicine | Admitting: Internal Medicine

## 2020-11-07 ENCOUNTER — Other Ambulatory Visit: Payer: Self-pay

## 2020-11-07 ENCOUNTER — Telehealth: Payer: Self-pay

## 2020-11-07 DIAGNOSIS — N6310 Unspecified lump in the right breast, unspecified quadrant: Secondary | ICD-10-CM | POA: Diagnosis not present

## 2020-11-07 DIAGNOSIS — R928 Other abnormal and inconclusive findings on diagnostic imaging of breast: Secondary | ICD-10-CM

## 2020-11-07 NOTE — Telephone Encounter (Signed)
Able to reach pt regarding her recent LE vascular US, Dr. [name] had a chance to review her results and advised   "Good flow in legs  No significant stenosis picked up! "  Pt very grateful of the good results, no questions or concerns at this time.

## 2020-11-08 ENCOUNTER — Ambulatory Visit: Payer: Medicare HMO

## 2020-11-09 ENCOUNTER — Other Ambulatory Visit: Payer: Self-pay | Admitting: *Deleted

## 2020-11-09 ENCOUNTER — Other Ambulatory Visit: Payer: Self-pay | Admitting: Internal Medicine

## 2020-11-09 DIAGNOSIS — F419 Anxiety disorder, unspecified: Secondary | ICD-10-CM

## 2020-11-09 DIAGNOSIS — F32A Depression, unspecified: Secondary | ICD-10-CM

## 2020-11-09 DIAGNOSIS — K5909 Other constipation: Secondary | ICD-10-CM

## 2020-11-09 DIAGNOSIS — K582 Mixed irritable bowel syndrome: Secondary | ICD-10-CM

## 2020-11-09 MED ORDER — METOPROLOL SUCCINATE ER 50 MG PO TB24: ORAL_TABLET | ORAL | 2 refills | Status: DC

## 2020-11-12 DIAGNOSIS — I259 Chronic ischemic heart disease, unspecified: Secondary | ICD-10-CM | POA: Diagnosis not present

## 2020-11-12 DIAGNOSIS — G4733 Obstructive sleep apnea (adult) (pediatric): Secondary | ICD-10-CM | POA: Diagnosis not present

## 2020-11-12 DIAGNOSIS — G4710 Hypersomnia, unspecified: Secondary | ICD-10-CM | POA: Diagnosis not present

## 2020-11-16 ENCOUNTER — Other Ambulatory Visit: Payer: Self-pay | Admitting: Internal Medicine

## 2020-11-20 DIAGNOSIS — M5442 Lumbago with sciatica, left side: Secondary | ICD-10-CM | POA: Diagnosis not present

## 2020-11-20 DIAGNOSIS — M542 Cervicalgia: Secondary | ICD-10-CM | POA: Diagnosis not present

## 2020-11-20 DIAGNOSIS — M5412 Radiculopathy, cervical region: Secondary | ICD-10-CM | POA: Diagnosis not present

## 2020-11-20 DIAGNOSIS — M9931 Osseous stenosis of neural canal of cervical region: Secondary | ICD-10-CM | POA: Diagnosis not present

## 2020-11-20 DIAGNOSIS — M5441 Lumbago with sciatica, right side: Secondary | ICD-10-CM | POA: Diagnosis not present

## 2020-11-20 DIAGNOSIS — G8929 Other chronic pain: Secondary | ICD-10-CM | POA: Diagnosis not present

## 2020-11-20 DIAGNOSIS — M48062 Spinal stenosis, lumbar region with neurogenic claudication: Secondary | ICD-10-CM | POA: Diagnosis not present

## 2020-11-21 ENCOUNTER — Ambulatory Visit: Payer: Medicare HMO | Admitting: Surgery

## 2020-11-26 ENCOUNTER — Telehealth: Payer: Self-pay | Admitting: Internal Medicine

## 2020-11-26 NOTE — Telephone Encounter (Signed)
RX Refill:lyrica Last Seen:10-16-20 Last ordered:05-22-20

## 2020-11-26 NOTE — Telephone Encounter (Signed)
Patient called in for refill for pregabalin (LYRICA) 75 MG capsule

## 2020-11-27 NOTE — Telephone Encounter (Signed)
Who is on her bottle for lyrica this is who it should go to ive never filled this I dont believe call patient maybe ortho or another specialist?

## 2020-11-28 NOTE — Telephone Encounter (Signed)
Patient states that this was sent in by Dr Manuella Ghazi but was dismissed due to no showed appointments   Is requesting a new referral and refill of medication until she can be seen again

## 2020-11-29 ENCOUNTER — Ambulatory Visit: Payer: Medicare HMO | Admitting: Pulmonary Disease

## 2020-11-29 DIAGNOSIS — G4710 Hypersomnia, unspecified: Secondary | ICD-10-CM | POA: Diagnosis not present

## 2020-11-29 DIAGNOSIS — G4733 Obstructive sleep apnea (adult) (pediatric): Secondary | ICD-10-CM | POA: Diagnosis not present

## 2020-11-29 DIAGNOSIS — I259 Chronic ischemic heart disease, unspecified: Secondary | ICD-10-CM | POA: Diagnosis not present

## 2020-12-02 ENCOUNTER — Other Ambulatory Visit: Payer: Self-pay | Admitting: Internal Medicine

## 2020-12-02 DIAGNOSIS — M5412 Radiculopathy, cervical region: Secondary | ICD-10-CM

## 2020-12-03 ENCOUNTER — Other Ambulatory Visit: Payer: Self-pay | Admitting: Cardiovascular Disease

## 2020-12-05 DIAGNOSIS — M25561 Pain in right knee: Secondary | ICD-10-CM | POA: Diagnosis not present

## 2020-12-05 DIAGNOSIS — M545 Low back pain, unspecified: Secondary | ICD-10-CM | POA: Diagnosis not present

## 2020-12-05 DIAGNOSIS — M6281 Muscle weakness (generalized): Secondary | ICD-10-CM | POA: Diagnosis not present

## 2020-12-07 DIAGNOSIS — M6281 Muscle weakness (generalized): Secondary | ICD-10-CM | POA: Diagnosis not present

## 2020-12-07 DIAGNOSIS — M25561 Pain in right knee: Secondary | ICD-10-CM | POA: Diagnosis not present

## 2020-12-07 DIAGNOSIS — M545 Low back pain, unspecified: Secondary | ICD-10-CM | POA: Diagnosis not present

## 2020-12-09 ENCOUNTER — Other Ambulatory Visit: Payer: Self-pay | Admitting: Internal Medicine

## 2020-12-09 DIAGNOSIS — J4530 Mild persistent asthma, uncomplicated: Secondary | ICD-10-CM

## 2020-12-10 ENCOUNTER — Other Ambulatory Visit: Payer: Self-pay | Admitting: Internal Medicine

## 2020-12-10 DIAGNOSIS — G629 Polyneuropathy, unspecified: Secondary | ICD-10-CM

## 2020-12-10 MED ORDER — PREGABALIN 75 MG PO CAPS: 75.0000 mg | ORAL_CAPSULE | Freq: Two times a day (BID) | ORAL | 11 refills | Status: DC

## 2020-12-10 NOTE — Telephone Encounter (Signed)
Lyrica 75 mg  bid h/o sensorimotor neuropathy was sent in 11/29/20 #60 with RF x 5 so she has 6 months of refills and non further needed from me   Please fax note to him Dr. Manuella Ghazi, did they dismiss the patient?  I need to know if this is the case before I refer to new neurologist?  Need response  If they did not dismiss this nice patient then Neurology -->please reach out ot her for an appt

## 2020-12-10 NOTE — Telephone Encounter (Signed)
Faxed to Dr Trena Platt office, awaiting response.

## 2020-12-14 DIAGNOSIS — M25561 Pain in right knee: Secondary | ICD-10-CM | POA: Diagnosis not present

## 2020-12-14 DIAGNOSIS — M6281 Muscle weakness (generalized): Secondary | ICD-10-CM | POA: Diagnosis not present

## 2020-12-14 DIAGNOSIS — M545 Low back pain, unspecified: Secondary | ICD-10-CM | POA: Diagnosis not present

## 2020-12-14 NOTE — Telephone Encounter (Signed)
Have you received this response?

## 2020-12-14 NOTE — Telephone Encounter (Signed)
No response yet

## 2020-12-14 NOTE — Telephone Encounter (Signed)
Hello,   Has this patient been dismissed from your practice due to no shows?   Thank you just needing to know in order to complete some medication management for this patient

## 2020-12-19 ENCOUNTER — Telehealth: Payer: Self-pay | Admitting: *Deleted

## 2020-12-19 NOTE — Telephone Encounter (Signed)
Pt left voice mail yesterday that she couldn't attend Diabetes classes this Thursday. She reports she is having physical therapy and has some family issues. Left a message yesterday to call back and reschedule. If no response will discharge since she has rescheduled classes multiple times.

## 2020-12-20 ENCOUNTER — Ambulatory Visit: Payer: Medicare HMO

## 2020-12-20 ENCOUNTER — Other Ambulatory Visit: Payer: Self-pay | Admitting: Internal Medicine

## 2020-12-20 ENCOUNTER — Other Ambulatory Visit: Payer: Self-pay | Admitting: Cardiovascular Disease

## 2020-12-20 DIAGNOSIS — M545 Low back pain, unspecified: Secondary | ICD-10-CM | POA: Diagnosis not present

## 2020-12-20 DIAGNOSIS — M6281 Muscle weakness (generalized): Secondary | ICD-10-CM | POA: Diagnosis not present

## 2020-12-20 DIAGNOSIS — M25561 Pain in right knee: Secondary | ICD-10-CM | POA: Diagnosis not present

## 2020-12-20 DIAGNOSIS — R42 Dizziness and giddiness: Secondary | ICD-10-CM

## 2020-12-20 DIAGNOSIS — K219 Gastro-esophageal reflux disease without esophagitis: Secondary | ICD-10-CM

## 2020-12-27 ENCOUNTER — Ambulatory Visit: Payer: Medicare HMO

## 2020-12-27 DIAGNOSIS — M545 Low back pain, unspecified: Secondary | ICD-10-CM | POA: Diagnosis not present

## 2020-12-27 DIAGNOSIS — M6281 Muscle weakness (generalized): Secondary | ICD-10-CM | POA: Diagnosis not present

## 2020-12-27 DIAGNOSIS — M25561 Pain in right knee: Secondary | ICD-10-CM | POA: Diagnosis not present

## 2020-12-29 ENCOUNTER — Other Ambulatory Visit: Payer: Self-pay | Admitting: Pulmonary Disease

## 2020-12-29 ENCOUNTER — Other Ambulatory Visit: Payer: Self-pay | Admitting: Internal Medicine

## 2020-12-29 DIAGNOSIS — E785 Hyperlipidemia, unspecified: Secondary | ICD-10-CM

## 2021-01-03 ENCOUNTER — Ambulatory Visit: Payer: Medicare HMO

## 2021-01-07 DIAGNOSIS — M545 Low back pain, unspecified: Secondary | ICD-10-CM | POA: Diagnosis not present

## 2021-01-07 DIAGNOSIS — M25561 Pain in right knee: Secondary | ICD-10-CM | POA: Diagnosis not present

## 2021-01-07 DIAGNOSIS — M6281 Muscle weakness (generalized): Secondary | ICD-10-CM | POA: Diagnosis not present

## 2021-01-09 DIAGNOSIS — M545 Low back pain, unspecified: Secondary | ICD-10-CM | POA: Diagnosis not present

## 2021-01-09 DIAGNOSIS — M25561 Pain in right knee: Secondary | ICD-10-CM | POA: Diagnosis not present

## 2021-01-09 DIAGNOSIS — M6281 Muscle weakness (generalized): Secondary | ICD-10-CM | POA: Diagnosis not present

## 2021-01-10 ENCOUNTER — Ambulatory Visit (INDEPENDENT_AMBULATORY_CARE_PROVIDER_SITE_OTHER): Payer: Medicare HMO

## 2021-01-10 ENCOUNTER — Other Ambulatory Visit: Payer: Self-pay

## 2021-01-10 ENCOUNTER — Ambulatory Visit (INDEPENDENT_AMBULATORY_CARE_PROVIDER_SITE_OTHER): Payer: Medicare HMO | Admitting: Internal Medicine

## 2021-01-10 ENCOUNTER — Encounter: Payer: Self-pay | Admitting: Internal Medicine

## 2021-01-10 VITALS — BP 112/82 | HR 73 | Temp 98.2°F | Ht 65.0 in | Wt 225.0 lb

## 2021-01-10 DIAGNOSIS — J4520 Mild intermittent asthma, uncomplicated: Secondary | ICD-10-CM

## 2021-01-10 DIAGNOSIS — I152 Hypertension secondary to endocrine disorders: Secondary | ICD-10-CM | POA: Diagnosis not present

## 2021-01-10 DIAGNOSIS — H539 Unspecified visual disturbance: Secondary | ICD-10-CM | POA: Diagnosis not present

## 2021-01-10 DIAGNOSIS — J309 Allergic rhinitis, unspecified: Secondary | ICD-10-CM

## 2021-01-10 DIAGNOSIS — M79601 Pain in right arm: Secondary | ICD-10-CM

## 2021-01-10 DIAGNOSIS — Z0000 Encounter for general adult medical examination without abnormal findings: Secondary | ICD-10-CM

## 2021-01-10 DIAGNOSIS — E1159 Type 2 diabetes mellitus with other circulatory complications: Secondary | ICD-10-CM

## 2021-01-10 DIAGNOSIS — G629 Polyneuropathy, unspecified: Secondary | ICD-10-CM | POA: Diagnosis not present

## 2021-01-10 DIAGNOSIS — E559 Vitamin D deficiency, unspecified: Secondary | ICD-10-CM | POA: Diagnosis not present

## 2021-01-10 DIAGNOSIS — Z9989 Dependence on other enabling machines and devices: Secondary | ICD-10-CM

## 2021-01-10 DIAGNOSIS — F419 Anxiety disorder, unspecified: Secondary | ICD-10-CM

## 2021-01-10 DIAGNOSIS — E538 Deficiency of other specified B group vitamins: Secondary | ICD-10-CM

## 2021-01-10 DIAGNOSIS — M5412 Radiculopathy, cervical region: Secondary | ICD-10-CM | POA: Diagnosis not present

## 2021-01-10 DIAGNOSIS — R519 Headache, unspecified: Secondary | ICD-10-CM | POA: Diagnosis not present

## 2021-01-10 DIAGNOSIS — R112 Nausea with vomiting, unspecified: Secondary | ICD-10-CM

## 2021-01-10 DIAGNOSIS — M79621 Pain in right upper arm: Secondary | ICD-10-CM | POA: Diagnosis not present

## 2021-01-10 DIAGNOSIS — D352 Benign neoplasm of pituitary gland: Secondary | ICD-10-CM | POA: Diagnosis not present

## 2021-01-10 DIAGNOSIS — J449 Chronic obstructive pulmonary disease, unspecified: Secondary | ICD-10-CM

## 2021-01-10 DIAGNOSIS — F32A Depression, unspecified: Secondary | ICD-10-CM

## 2021-01-10 DIAGNOSIS — N6310 Unspecified lump in the right breast, unspecified quadrant: Secondary | ICD-10-CM

## 2021-01-10 DIAGNOSIS — E876 Hypokalemia: Secondary | ICD-10-CM

## 2021-01-10 DIAGNOSIS — K219 Gastro-esophageal reflux disease without esophagitis: Secondary | ICD-10-CM

## 2021-01-10 DIAGNOSIS — G4700 Insomnia, unspecified: Secondary | ICD-10-CM

## 2021-01-10 DIAGNOSIS — K582 Mixed irritable bowel syndrome: Secondary | ICD-10-CM

## 2021-01-10 DIAGNOSIS — R42 Dizziness and giddiness: Secondary | ICD-10-CM

## 2021-01-10 DIAGNOSIS — E785 Hyperlipidemia, unspecified: Secondary | ICD-10-CM

## 2021-01-10 DIAGNOSIS — I10 Essential (primary) hypertension: Secondary | ICD-10-CM

## 2021-01-10 DIAGNOSIS — R32 Unspecified urinary incontinence: Secondary | ICD-10-CM

## 2021-01-10 DIAGNOSIS — L989 Disorder of the skin and subcutaneous tissue, unspecified: Secondary | ICD-10-CM

## 2021-01-10 DIAGNOSIS — K5909 Other constipation: Secondary | ICD-10-CM

## 2021-01-10 DIAGNOSIS — R2231 Localized swelling, mass and lump, right upper limb: Secondary | ICD-10-CM

## 2021-01-10 DIAGNOSIS — G4733 Obstructive sleep apnea (adult) (pediatric): Secondary | ICD-10-CM

## 2021-01-10 DIAGNOSIS — L298 Other pruritus: Secondary | ICD-10-CM

## 2021-01-10 LAB — CBC WITH DIFFERENTIAL/PLATELET
Basophils Absolute: 0 10*3/uL (ref 0.0–0.1)
Basophils Relative: 0.8 % (ref 0.0–3.0)
Eosinophils Absolute: 0.2 10*3/uL (ref 0.0–0.7)
Eosinophils Relative: 2.7 % (ref 0.0–5.0)
HCT: 43.4 % (ref 36.0–46.0)
Hemoglobin: 14.4 g/dL (ref 12.0–15.0)
Lymphocytes Relative: 49.2 % — ABNORMAL HIGH (ref 12.0–46.0)
Lymphs Abs: 3 10*3/uL (ref 0.7–4.0)
MCHC: 33.2 g/dL (ref 30.0–36.0)
MCV: 95.5 fl (ref 78.0–100.0)
Monocytes Absolute: 0.5 10*3/uL (ref 0.1–1.0)
Monocytes Relative: 7.5 % (ref 3.0–12.0)
Neutro Abs: 2.4 10*3/uL (ref 1.4–7.7)
Neutrophils Relative %: 39.8 % — ABNORMAL LOW (ref 43.0–77.0)
Platelets: 168 10*3/uL (ref 150.0–400.0)
RBC: 4.54 Mil/uL (ref 3.87–5.11)
RDW: 13.7 % (ref 11.5–15.5)
WBC: 6.1 10*3/uL (ref 4.0–10.5)

## 2021-01-10 LAB — COMPREHENSIVE METABOLIC PANEL
ALT: 18 U/L (ref 0–35)
AST: 17 U/L (ref 0–37)
Albumin: 4.5 g/dL (ref 3.5–5.2)
Alkaline Phosphatase: 96 U/L (ref 39–117)
BUN: 10 mg/dL (ref 6–23)
CO2: 25 mEq/L (ref 19–32)
Calcium: 9.7 mg/dL (ref 8.4–10.5)
Chloride: 107 mEq/L (ref 96–112)
Creatinine, Ser: 1.03 mg/dL (ref 0.40–1.20)
GFR: 58.32 mL/min — ABNORMAL LOW (ref >60.00)
Glucose, Bld: 93 mg/dL (ref 70–99)
Potassium: 3.7 mEq/L (ref 3.5–5.1)
Sodium: 142 mEq/L (ref 135–145)
Total Bilirubin: 0.3 mg/dL (ref 0.2–1.2)
Total Protein: 7.1 g/dL (ref 6.0–8.3)

## 2021-01-10 LAB — LIPID PANEL
Cholesterol: 133 mg/dL (ref 0–200)
HDL: 51.4 mg/dL (ref >39.00)
LDL Cholesterol: 56 mg/dL (ref 0–99)
NonHDL: 81.99
Total CHOL/HDL Ratio: 3
Triglycerides: 130 mg/dL (ref 0.0–149.0)
VLDL: 26 mg/dL (ref 0.0–40.0)

## 2021-01-10 LAB — VITAMIN D 25 HYDROXY (VIT D DEFICIENCY, FRACTURES): VITD: 21.23 ng/mL — ABNORMAL LOW (ref 30.00–100.00)

## 2021-01-10 LAB — VITAMIN B12: Vitamin B-12: 499 pg/mL (ref 211–911)

## 2021-01-10 LAB — HEMOGLOBIN A1C: Hgb A1c MFr Bld: 6.7 % — ABNORMAL HIGH (ref 4.6–6.5)

## 2021-01-10 MED ORDER — OLOPATADINE HCL 0.1 % OP SOLN: 1.0000 [drp] | Freq: Two times a day (BID) | OPHTHALMIC | 11 refills | Status: DC

## 2021-01-10 MED ORDER — ONDANSETRON HCL 4 MG PO TABS: 4.0000 mg | ORAL_TABLET | Freq: Three times a day (TID) | ORAL | 5 refills | Status: DC | PRN

## 2021-01-10 MED ORDER — MECLIZINE HCL 25 MG PO TABS: ORAL_TABLET | ORAL | 11 refills | Status: DC

## 2021-01-10 MED ORDER — LEVOCETIRIZINE DIHYDROCHLORIDE 5 MG PO TABS: 5.0000 mg | ORAL_TABLET | Freq: Every evening | ORAL | 3 refills | Status: DC | PRN

## 2021-01-10 MED ORDER — TRELEGY ELLIPTA 100-62.5-25 MCG/INH IN AEPB: INHALATION_SPRAY | RESPIRATORY_TRACT | 11 refills | Status: DC

## 2021-01-10 MED ORDER — FUROSEMIDE 20 MG PO TABS: ORAL_TABLET | ORAL | 3 refills | Status: DC

## 2021-01-10 MED ORDER — IPRATROPIUM-ALBUTEROL 0.5-2.5 (3) MG/3ML IN SOLN: 3.0000 mL | Freq: Two times a day (BID) | RESPIRATORY_TRACT | 11 refills | Status: DC | PRN

## 2021-01-10 MED ORDER — CYCLOBENZAPRINE HCL 5 MG PO TABS
5.0000 mg | ORAL_TABLET | Freq: Every day | ORAL | 11 refills | Status: DC | PRN
Start: 2021-01-10 — End: 2021-12-06

## 2021-01-10 MED ORDER — POTASSIUM CHLORIDE CRYS ER 10 MEQ PO TBCR: 10.0000 meq | EXTENDED_RELEASE_TABLET | Freq: Every day | ORAL | 3 refills | Status: DC

## 2021-01-10 MED ORDER — DICYCLOMINE HCL 10 MG PO CAPS: 10.0000 mg | ORAL_CAPSULE | Freq: Three times a day (TID) | ORAL | 11 refills | Status: DC

## 2021-01-10 MED ORDER — HYDROXYZINE HCL 25 MG PO TABS: ORAL_TABLET | ORAL | 11 refills | Status: DC

## 2021-01-10 MED ORDER — AZELASTINE HCL 0.15 % NA SOLN: 2.0000 | Freq: Every day | NASAL | 11 refills | Status: DC | PRN

## 2021-01-10 MED ORDER — EZETIMIBE 10 MG PO TABS: 10.0000 mg | ORAL_TABLET | Freq: Every day | ORAL | 3 refills | Status: DC

## 2021-01-10 MED ORDER — LOSARTAN POTASSIUM 25 MG PO TABS: 12.5000 mg | ORAL_TABLET | Freq: Every day | ORAL | 3 refills | Status: DC

## 2021-01-10 MED ORDER — ROSUVASTATIN CALCIUM 20 MG PO TABS
20.0000 mg | ORAL_TABLET | Freq: Every day | ORAL | 3 refills | Status: DC
Start: 2021-01-10 — End: 2021-02-12

## 2021-01-10 MED ORDER — TRAZODONE HCL 50 MG PO TABS
25.0000 mg | ORAL_TABLET | Freq: Every evening | ORAL | 3 refills | Status: DC | PRN
Start: 2021-01-10 — End: 2021-12-06

## 2021-01-10 MED ORDER — ALBUTEROL SULFATE HFA 108 (90 BASE) MCG/ACT IN AERS: 2.0000 | INHALATION_SPRAY | Freq: Four times a day (QID) | RESPIRATORY_TRACT | 12 refills | Status: DC | PRN

## 2021-01-10 MED ORDER — MONTELUKAST SODIUM 10 MG PO TABS: 1.0000 | ORAL_TABLET | Freq: Every day | ORAL | 3 refills | Status: DC

## 2021-01-10 MED ORDER — FLUTICASONE PROPIONATE 50 MCG/ACT NA SUSP: 2.0000 | Freq: Every day | NASAL | 11 refills | Status: DC | PRN

## 2021-01-10 MED ORDER — MIRABEGRON ER 25 MG PO TB24: 25.0000 mg | ORAL_TABLET | Freq: Every day | ORAL | 3 refills | Status: DC

## 2021-01-10 MED ORDER — ESOMEPRAZOLE MAGNESIUM 40 MG PO CPDR: 40.0000 mg | DELAYED_RELEASE_CAPSULE | Freq: Every day | ORAL | 3 refills | Status: DC

## 2021-01-10 MED ORDER — PREGABALIN 75 MG PO CAPS: 75.0000 mg | ORAL_CAPSULE | Freq: Two times a day (BID) | ORAL | 5 refills | Status: DC

## 2021-01-10 MED ORDER — METOPROLOL SUCCINATE ER 50 MG PO TB24: ORAL_TABLET | ORAL | 3 refills | Status: DC

## 2021-01-10 MED ORDER — PAROXETINE HCL 20 MG PO TABS: 20.0000 mg | ORAL_TABLET | Freq: Every day | ORAL | 3 refills | Status: DC

## 2021-01-10 MED ORDER — LINACLOTIDE 72 MCG PO CAPS: ORAL_CAPSULE | ORAL | 3 refills | Status: DC

## 2021-01-10 NOTE — Patient Instructions (Addendum)
Anbesol gel for numbing  Ice  To mouth   Ambi  for dark spots over the counter   3rd pfizer due and need a 4th in 5-6 months

## 2021-01-10 NOTE — Progress Notes (Signed)
Chief Complaint  Patient presents with   Annual Exam   Annual 1. She has bitten right side of tongue multiple x and bleeding today and prior to today but resolved with pressure with guaze on xarelto this is likely why the bleeding so excessvie  2. F/u mammogram dx b/l and right [place] to f/u right breast mass due 11/07/21 3. 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 H/o COPD and Osa needs new Cpap machine must f/u pulm for this  5. C/o right shoulder right upper arm pain Xray today negative. She has a lump in right upper arm which is painful 10/10 will do [place] and if negative have her f/u [facility] established  6. HTN with DM 2 on zetia 10, lasix 20 mg qd, losartan 12.5 mg qd, Toprol xl 50 mg qd, imdur 30 mg qd crestor 20 mg qd and xarelto 2.5 mg bid  Labs today she is fasting   Review of Systems  Constitutional:  Negative for weight loss.  HENT:  Negative for hearing loss.        +tongue bleeding due to biting   Eyes:  Positive for blurred vision.       Reduced vision  Respiratory:  Negative for shortness of breath.   Cardiovascular:  Negative for chest pain.  Gastrointestinal:  Negative for vomiting.  Genitourinary:  Positive for frequency.  Musculoskeletal:  Positive for back pain, joint pain and neck pain. Negative for falls.  Skin:  Negative for rash.  Neurological:  Positive for headaches.  Psychiatric/Behavioral:  Negative for memory loss.   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 being followed by neurosurgery/eye doctor and pcp. monitoring size.causes sinus problems   Bronchitis    Cardiac arrhythmia    Cervical neck pain with evidence of disc disease    C5/6 disease, MRI done late 2012 - no records  available   Chronic combined systolic and diastolic CHF (congestive heart failure) ([place])    a. 08/2010 Echo: mildly reduced EF 40-45%, mild diffuse hypokinesis; b. 06/2016 Echo: EF 50%, no rwma, mild to mod TR; c. 03/2017 Echo: EF 40-45%, Gr1 DD (prior echo reviewed and EF felt to be lower than reported).   Chronic pain    Cocaine abuse, in remission (HCC)    clean x 24 years   Colon polyps    COPD (chronic obstructive pulmonary disease) ([place])    a. 12/2018 PFT: No obvious obst/restrictive dzs.   Coronary artery disease    a. PCI of LCX 2003; b. PCI of the LAD 2012 with a (2.5 x 8 mm BMS);  c.s/p CABG 4/12: L-LAD, VG-Dx, VG-OM, VG-RCA (Dr. Prescott Gum);  d. 01/2012 MV: inf infarct, attenuation, no ischemia; e. 02/2017 MV: signifi attenuation artifact. Fixed basal antlat/inflat scar vs artifact. Reversible apical lat and mid antlat defect - ? atten vs ischemia. F/u echo w/o wma->Med rx.   Depression    Diabetes mellitus without complication ([place])    Family history of colon cancer    Generalized headaches    frequent   GERD (gastroesophageal reflux disease)    Headache    Heart murmur  Hematochezia    Hepatitis    history of hepatitis b   History of cervical cancer    s/p cryotherapy   History of drug abuse ([place])    cocaine, marijuana, clean since 1989   History of hepatitis B    from eating undercooked liver   History of MI (myocardial infarction)    Hyperlipidemia    Hypertension    Ischemic cardiomyopathy    a. 03/2017 Echo: EF 40-45%, Gr1 DD.   Myocardial infarction ([place]) 2003, 2012   PAD (peripheral artery disease) ([place])    a. s/p Right SFA atherectomy and PTA 01/15/11; b. 07/2018 ABI: R 1.02, L 1.09.   Pituitary mass ([place])    a. 12/2018 MRI Brain: Stable pituitary mass w/ 42mm area of necrosis. Mass abuts R cavernous sinus w/o definite invasion.   Polyp of colon    Seasonal allergies    Sleep apnea    uses cpap   Smoking history    quit 07/2010   Thyroid disease    Urinary  incontinence    Past Surgical History:  Procedure Laterality Date   ABDOMINAL HYSTERECTOMY     2003   CHOLECYSTECTOMY  1986   COLONOSCOPY  2008   3 polyps   COLONOSCOPY WITH PROPOFOL N/A 02/06/2015   Procedure: COLONOSCOPY WITH PROPOFOL;  Surgeon: Lucilla Lame, MD;  Location: [facility] ENDOSCOPY;  Service: Endoscopy;  Laterality: N/A;   COLONOSCOPY WITH PROPOFOL N/A 01/27/2017   Procedure: COLONOSCOPY WITH PROPOFOL;  Surgeon: Lucilla Lame, MD;  Location: [facility] ENDOSCOPY;  Service: Endoscopy;  Laterality: N/A;   CORONARY ANGIOPLASTY     w/ stent placement x2   CORONARY ARTERY BYPASS GRAFT  2012   Dr [name]   CORONARY STENT PLACEMENT  2003   S/P MI   CORONARY STENT PLACEMENT  2007   [place]   ESOPHAGOGASTRODUODENOSCOPY (EGD) WITH PROPOFOL N/A 02/06/2015   Procedure: ESOPHAGOGASTRODUODENOSCOPY (EGD) WITH PROPOFOL;  Surgeon: Lucilla Lame, MD;  Location: [facility] ENDOSCOPY;  Service: Endoscopy;  Laterality: N/A;   ESOPHAGOGASTRODUODENOSCOPY (EGD) WITH PROPOFOL N/A 01/27/2017   Procedure: ESOPHAGOGASTRODUODENOSCOPY (EGD) WITH PROPOFOL;  Surgeon: Lucilla Lame, MD;  Location: [facility] ENDOSCOPY;  Service: Endoscopy;  Laterality: N/A;   ESOPHAGOGASTRODUODENOSCOPY (EGD) WITH PROPOFOL N/A 09/01/2019   Procedure: ESOPHAGOGASTRODUODENOSCOPY (EGD) WITH PROPOFOL;  Surgeon: Lin Landsman, MD;  Location: [place];  Service: Gastroenterology;  Laterality: N/A;   FEMORAL ARTERY STENT  10/2010   right sided (Dr. Burt Knack)   KNEE ARTHROSCOPY WITH MEDIAL MENISECTOMY Right 08/20/2017   Procedure: KNEE ARTHROSCOPY WITH MEDIAL  AND LATERAL MENISECTOMY;  Surgeon: Hessie Knows, MD;  Location: [facility] ORS;  Service: Orthopedics;  Laterality: Right;   POSTERIOR CERVICAL LAMINECTOMY N/A 03/08/2018   Procedure: POSTERIOR CERVICAL LAMINECTOMY-C7;  Surgeon: Deetta Perla, MD;  Location: [facility] ORS;  Service: Neurosurgery;  Laterality: N/A;   TONSILLECTOMY     TOTAL KNEE ARTHROPLASTY Right 04/14/2019   Procedure: RIGHT TOTAL KNEE  ARTHROPLASTY;  Surgeon: Hessie Knows, MD;  Location: [facility] ORS;  Service: Orthopedics;  Laterality: Right;   TUBAL LIGATION     Family History  Problem Relation Age of Onset   Hypertension Father    Heart failure Father    Diabetes Father    Colon cancer Father 66   Glaucoma Father    Cancer Father        colorectal    Heart disease Father    Other Father        glaucoma   Breast cancer Mother 57  breast cancer, late 26's   Cancer Mother        breast   Brain cancer Sister    Arthritis Sister    Diabetes Sister    Hypertension Sister    Kidney disease Sister    Diabetes Brother    Hypertension Brother    Other Sister        brain tumor    Acute myelogenous leukemia Grandson        06/2019   Coronary artery disease Neg Hx    Stroke Neg Hx    Social History   Socioeconomic History   Marital status: Legally Separated    Spouse name: Not on file   Number of children: Not on file   Years of education: Not on file   Highest education level: Not on file  Occupational History   Not on file  Tobacco Use   Smoking status: Former    Packs/day: 1.00    Years: 38.00    Pack years: 38.00    Types: Cigarettes    Quit date: 08/12/2010    Years since quitting: 10.4   Smokeless tobacco: Never  Vaping Use   Vaping Use: Never used  Substance and Sexual Activity   Alcohol use: No   Drug use: No    Types: Cocaine    Comment: Remote Hx (crack cocaine and marijuana).none since 75yrs plus   Sexual activity: Yes  Other Topics Concern   Not on file  Social History Narrative   Caffeine: 1 cup coffee/day   Lives with family, no pets   Occupation: industrial work, 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   Edu: 11th grade   Activity: no regular exercise   Diet: good water, vegetables daily, low salt diet   Lives with sister and other family    No guns, wears seat belts, safe in relationship    2 kids    GED 1 year of college    Social Determinants of Health   Financial Resource  Strain: Low Risk    Difficulty of Paying Living Expenses: Not hard at all  Food Insecurity: No Food Insecurity   Worried About Charity fundraiser in the Last Year: Never true   Arboriculturist in the Last Year: Never true  Transportation Needs: No Transportation Needs   Lack of Transportation (Medical): No   Lack of Transportation (Non-Medical): No  Physical Activity: Unknown   Days of Exercise per Week: 0 days   Minutes of Exercise per Session: Not on file  Stress: No Stress Concern Present   Feeling of Stress : Not at all  Social Connections: Unknown   Frequency of Communication with Friends and Family: More than three times a week   Frequency of Social Gatherings with Friends and Family: Once a week   Attends Religious Services: 1 to 4 times per year   Active Member of [employer] or Organizations: Yes   Attends Music therapist: More than 4 times per year   Marital Status: Not on file  Intimate Partner Violence: Not At Risk   Fear of Current or Ex-Partner: No   Emotionally Abused: No   Physically Abused: No   Sexually Abused: No   Current Meds  Medication Sig   aspirin EC 81 MG tablet Take 1 tablet (81 mg total) by mouth daily.   cyanocobalamin (,VITAMIN B-12,) 1000 MCG/ML injection Inject 1 mL (1,000 mcg total) into the muscle every 30 (thirty) days.   isosorbide mononitrate (IMDUR) 30  MG 24 hr tablet Take 1 tablet by mouth twice daily (Patient taking differently: Take 30 mg by mouth daily.)   Lancets (ONETOUCH DELICA PLUS [id number]) MISC 1 Device by Does not apply route in the morning and at bedtime.   mupirocin ointment (BACTROBAN) 2 % Apply 1 application topically 2 (two) times daily. Both nostrils (nose) x 5 days   NEEDLE, DISP, 25 G 25G X 1-1/2" MISC 1 Device by Does not apply route every 30 (thirty) days.   nitroGLYCERIN (NITROSTAT) 0.4 MG SL tablet Place 1 tablet (0.4 mg total) under the tongue every 5 (five) minutes as needed.   ONETOUCH VERIO test strip USE AS  DIRECTED   REPATHA SURECLICK 938 MG/ML SOAJ INJECT 1 PEN INTO THE SKIN EVERY 14 DAYS   sodium chloride (OCEAN) 0.65 % SOLN nasal spray Place 2 sprays into both nostrils daily as needed for congestion.   XARELTO 2.5 MG TABS tablet Take 1 tablet by mouth twice daily   [DISCONTINUED] albuterol (VENTOLIN HFA) 108 (90 Base) MCG/ACT inhaler Inhale 2 puffs into the lungs every 6 (six) hours as needed for wheezing or shortness of breath.   [DISCONTINUED] Azelastine HCl 0.15 % SOLN Place 2 sprays into both nostrils daily as needed (allergies).    [DISCONTINUED] cyclobenzaprine (FLEXERIL) 5 MG tablet TAKE 1 TO 2 TABLET(S) BY MOUTH AT BEDTIME AS NEEDED FOR MUSCLE SPASMS   [DISCONTINUED] dicyclomine (BENTYL) 10 MG capsule Take 1 capsule (10 mg total) by mouth 3 (three) times daily before meals. As needed for abdominal pain   [DISCONTINUED] esomeprazole (NEXIUM) 40 MG capsule Take 1 capsule by mouth once daily   [DISCONTINUED] ezetimibe (ZETIA) 10 MG tablet Take 1 tablet by mouth once daily   [DISCONTINUED] fluticasone (FLONASE) 50 MCG/ACT nasal spray Place 2 sprays into both nostrils daily as needed for allergies. Max dose 2 sprays each nostril   [DISCONTINUED] furosemide (LASIX) 20 MG tablet TAKE 1 TABLET BY MOUTH ONCE DAILY AND A SECOND TABLET IF NEEDED FOR FLUID BUILD UP   [DISCONTINUED] hydrOXYzine (ATARAX/VISTARIL) 25 MG tablet TAKE 1 TO 2 TABLETS BY MOUTH ONCE DAILY AS NEEDED   [DISCONTINUED] ipratropium-albuterol (DUONEB) 0.5-2.5 (3) MG/3ML SOLN Take 3 mLs by nebulization 2 (two) times daily as needed (wheezing/shortness of breath).   [DISCONTINUED] levocetirizine (XYZAL) 5 MG tablet Take 1 tablet (5 mg total) by mouth at bedtime as needed for allergies.   [DISCONTINUED] LINZESS 72 MCG capsule TAKE 1 CAPSULE BY MOUTH ONCE DAILY BEFORE BREAKFAST   [DISCONTINUED] meclizine (ANTIVERT) 25 MG tablet TAKE 1 TABLET BY MOUTH TWICE DAILY AS NEEDED FOR DIZZINESS   [DISCONTINUED] metoprolol succinate (TOPROL-XL) 50  MG 24 hr tablet TAKE 1 TABLET BY MOUTH ONCE DAILY WITH OR IMMEDIATELY FOLLOWING A MEAL   [DISCONTINUED] mirabegron ER (MYRBETRIQ) 25 MG TB24 tablet Take 1 tablet (25 mg total) by mouth daily.   [DISCONTINUED] montelukast (SINGULAIR) 10 MG tablet TAKE 1 TABLET BY MOUTH AT BEDTIME   [DISCONTINUED] olopatadine (PATANOL) 0.1 % ophthalmic solution Place 1 drop into both eyes 2 (two) times daily.   [DISCONTINUED] ondansetron (ZOFRAN) 4 MG tablet Take 1 tablet (4 mg total) by mouth every 8 (eight) hours as needed for nausea or vomiting.   [DISCONTINUED] PARoxetine (PAXIL) 20 MG tablet Take 1 tablet (20 mg total) by mouth daily.   [DISCONTINUED] potassium chloride (KLOR-CON) 10 MEQ tablet Take 1 tablet by mouth once daily   [DISCONTINUED] pregabalin (LYRICA) 75 MG capsule Take 1 capsule (75 mg total) by mouth 2 (two) times  daily.   [DISCONTINUED] rosuvastatin (CRESTOR) 20 MG tablet TAKE 1 TABLET BY MOUTH AT BEDTIME   [DISCONTINUED] traZODone (DESYREL) 50 MG tablet Take 0.5-1 tablets (25-50 mg total) by mouth at bedtime as needed for sleep.   [DISCONTINUED] TRELEGY ELLIPTA 100-62.5-25 MCG/INH AEPB INHALE 1 PUFF ONCE DAILY   Allergies  Allergen Reactions   Latex Rash        Shellfish Allergy Anaphylaxis    Hard shellfish/swelling in throat   Sulfonamide Derivatives Anaphylaxis    Swelling in throat.   Watermelon [Citrullus Vulgaris] Other (See Comments)    Throat itchy   Soy Allergy     Diarrhea/upset stomach     Tomato Itching    Throat itchy - also Raw carrots   Other Swelling   Recent Results (from the past 2160 hour(s))  Comprehensive metabolic panel     Status: Abnormal   Collection Time: 01/10/21 10:50 AM  Result Value Ref Range   Sodium 142 135 - 145 mEq/L   Potassium 3.7 3.5 - 5.1 mEq/L   Chloride 107 96 - 112 mEq/L   CO2 25 19 - 32 mEq/L   Glucose, Bld 93 70 - 99 mg/dL   BUN 10 6 - 23 mg/dL   Creatinine, Ser 1.03 0.40 - 1.20 mg/dL   Total Bilirubin 0.3 0.2 - 1.2 mg/dL    Alkaline Phosphatase 96 39 - 117 U/L   AST 17 0 - 37 U/L   ALT 18 0 - 35 U/L   Total Protein 7.1 6.0 - 8.3 g/dL   Albumin 4.5 3.5 - 5.2 g/dL   GFR 58.32 (L) >60.00 mL/min    Comment: Calculated using the CKD-EPI Creatinine Equation (2021)   Calcium 9.7 8.4 - 10.5 mg/dL  Lipid panel     Status: None   Collection Time: 01/10/21 10:50 AM  Result Value Ref Range   Cholesterol 133 0 - 200 mg/dL    Comment: ATP III Classification       Desirable:  < 200 mg/dL               Borderline High:  200 - 239 mg/dL          High:  > = 240 mg/dL   Triglycerides 130.0 0.0 - 149.0 mg/dL    Comment: Normal:  <150 mg/dLBorderline High:  150 - 199 mg/dL   HDL 51.40 >39.00 mg/dL   VLDL 26.0 0.0 - 40.0 mg/dL   LDL Cholesterol 56 0 - 99 mg/dL   Total CHOL/HDL Ratio 3     Comment:                Men          Women1/2 Average Risk     3.4          3.3Average Risk          5.0          4.42X Average Risk          9.6          7.13X Average Risk          15.0          11.0                       NonHDL 81.99     Comment: NOTE:  Non-HDL goal should be 30 mg/dL higher than patient's LDL goal (i.e. LDL goal of < 70 mg/dL, would have non-HDL goal of <  100 mg/dL)  Hemoglobin A1c     Status: Abnormal   Collection Time: 01/10/21 10:50 AM  Result Value Ref Range   Hgb A1c MFr Bld 6.7 (H) 4.6 - 6.5 %    Comment: Glycemic Control Guidelines for People with Diabetes:Non Diabetic:  <6%Goal of Therapy: <7%Additional Action Suggested:  >8%   CBC w/Diff     Status: Abnormal   Collection Time: 01/10/21 10:50 AM  Result Value Ref Range   WBC 6.1 4.0 - 10.5 K/uL   RBC 4.54 3.87 - 5.11 Mil/uL   Hemoglobin 14.4 12.0 - 15.0 g/dL   HCT 43.4 36.0 - 46.0 %   MCV 95.5 78.0 - 100.0 fl   MCHC 33.2 30.0 - 36.0 g/dL   RDW 13.7 11.5 - 15.5 %   Platelets 168.0 150.0 - 400.0 K/uL   Neutrophils Relative % 39.8 (L) 43.0 - 77.0 %   Lymphocytes Relative 49.2 (H) 12.0 - 46.0 %   Monocytes Relative 7.5 3.0 - 12.0 %   Eosinophils Relative 2.7  0.0 - 5.0 %   Basophils Relative 0.8 0.0 - 3.0 %   Neutro Abs 2.4 1.4 - 7.7 K/uL   Lymphs Abs 3.0 0.7 - 4.0 K/uL   Monocytes Absolute 0.5 0.1 - 1.0 K/uL   Eosinophils Absolute 0.2 0.0 - 0.7 K/uL   Basophils Absolute 0.0 0.0 - 0.1 K/uL  Microalbumin / creatinine urine ratio     Status: None   Collection Time: 01/10/21 10:50 AM  Result Value Ref Range   Creatinine, Urine 52 20 - 275 mg/dL   Microalb, Ur 0.2 mg/dL    Comment: Reference Range Not established    Microalb Creat Ratio 4 <30 mcg/mg creat    Comment: . The ADA defines abnormalities in albumin excretion as follows: [name] Albuminuria Category        Result (mcg/mg creatinine) . Normal to Mildly increased   <30 Moderately increased         30-299  Severely increased           > OR = 300 . The ADA recommends that at least two of three specimens collected within a 3-6 month period be abnormal before considering a patient to be within a diagnostic category.   Vitamin B12     Status: None   Collection Time: 01/10/21 10:50 AM  Result Value Ref Range   Vitamin B-12 499 211 - 911 pg/mL  Vitamin D (25 hydroxy)     Status: Abnormal   Collection Time: 01/10/21 10:50 AM  Result Value Ref Range   VITD 21.23 (L) 30.00 - 100.00 ng/mL   Objective  Body mass index is 37.44 kg/m. Wt Readings from Last 3 Encounters:  01/10/21 225 lb (102.1 kg)  10/16/20 225 lb (102.1 kg)  10/03/20 222 lb (100.7 kg)   Temp Readings from Last 3 Encounters:  01/10/21 98.2 F (36.8 C) (Oral)  10/16/20 98.2 F (36.8 C) (Oral)  10/03/20 98.1 F (36.7 C)   BP Readings from Last 3 Encounters:  01/10/21 112/82  10/16/20 130/80  10/03/20 122/81   Pulse Readings from Last 3 Encounters:  01/10/21 73  10/03/20 70  10/03/20 (!) 58    Physical Exam Vitals and nursing note reviewed.  Constitutional:      Appearance: Normal appearance. She is well-developed and well-groomed. She is obese.  HENT:     Head: Normocephalic and atraumatic.      Mouth/Throat:   Eyes:     Conjunctiva/sclera: Conjunctivae normal.  Pupils: Pupils are equal, round, and reactive to light.  Cardiovascular:     Rate and Rhythm: Normal rate and regular rhythm.     Heart sounds: Normal heart sounds.  Pulmonary:     Effort: Pulmonary effort is normal.     Breath sounds: Normal breath sounds.  Abdominal:     General: Abdomen is flat. Bowel sounds are normal.  Skin:    General: Skin is warm and moist.  Neurological:     General: No focal deficit present.     Mental Status: She is alert and oriented to person, place, and time. Mental status is at baseline.     Gait: Gait normal.  Psychiatric:        Attention and Perception: Attention and perception normal.        Mood and Affect: Mood and affect normal.        Speech: Speech normal.        Behavior: Behavior normal. Behavior is cooperative.        Thought Content: Thought content normal.        Cognition and Memory: Cognition and memory normal.        Judgment: Judgment normal.    Assessment  Plan  Annual physical exam Flu shot utd Consider shingrix in future at pharmacy given Rx 01/10/21  pna 23 had 03/10/16 consider in future  covid 2/2  pfizer consider booster disc again today  Tdap 05/05/2019 Consider prevnar in future   S/p hysterectomy no cervix and f/u OB/GYN h/o abnormal pap s/p cryo -pap 05/08/15 neg pap will  Repeat In 5 years   Colonoscopy Dr. Allen Norris 01/2017 polyps, gastritis, diverticulosis est Dr. Allen Norris f/u 01/27/2022  referred today est with Dr. Marius Ditch 03/20/20    Mammogram 10/13/2018 negative re ordered sch 09/01/19 left dx mammogram As of 3 or 10/28/19 utd benign cyst likely right breast rec [place] in 6 months to f/u ordered and sch 08/08/20 RECOMMENDATION: Bilateral diagnostic mammogram and RIGHT breast ultrasound in 3 months to ensure 1 year evaluation as per patient desire.   Mammogram 4//13/22 dx mammogram neg repeat in 1 year b/l diagnostic mammogram 1 year and right breast US due  11/06/21  Rec healthy diet and exercise     Sensorimotor neuropathy - Plan: pregabalin (LYRICA) 75 MG capsule BID, Ambulatory referral to Neurology Pituitary macroadenoma ([place]) - Plan: Ambulatory referral to Neurology Intractable headache, unspecified chronicity pattern, unspecified headache type - Plan: Ambulatory referral to Neurology Vision changes - Plan: Ambulatory referral to Neurology and f/u with eye MD     Arm pain, right - Plan: DG Humerus Right Arm mass, right - Plan: [place] RT UPPER EXTREM LTD SOFT TISSUE NON VASCULAR FINDINGS: There is no evidence of fracture or dislocation. There is no evidence of arthropathy or other focal bone abnormality. Soft tissues are unremarkable. There is cardiomegaly. The patient appears to be status post prior CABG. Aortic calcifications are noted.   IMPRESSION: No acute displaced fracture or dislocation.   Electronically Signed   By: Constance Holster M.D.   On: 10/17/2020 12:18  If negative est with [facility] f/u   Chronic obstructive pulmonary disease vs asthma or combined, and OSA on cpap - Plan: albuterol (VENTOLIN HFA) 108 (90 Base) MCG/ACT inhaler, prn duoneb Pt needs new machine through pulm advised to call and f/u today  Cervical radiculopathy - Plan: cyclobenzaprine (FLEXERIL) 5 MG tablet prn f/u [facility] and s/p cervical next surgery Dr. Remo Lipps cook ns can f/u ns prn  Irritable bowel syndrome with both constipation and diarrhea - Plan: dicyclomine (BENTYL) 10 MG capsule, linaclotide (LINZESS) 72 MCG capsule is helping Gastroesophageal reflux disease without esophagitis - Plan: esomeprazole (NEXIUM) 40 MG capsule -est with Dr Allen Norris as well   Allergic rhinitis, unspecified seasonality, unspecified trigger - Plan: fluticasone (FLONASE) 50 MCG/ACT nasal spray, levocetirizine (XYZAL) 5 MG tablet,singular 10 mg qhs  Anxiety and depression - Plan: hydrOXYzine (ATARAX/VISTARIL) 25 MG tablet prn anxiety, PARoxetine (PAXIL) 20 MG  tablet  Vertigo - Plan: meclizine (ANTIVERT) 25 MG tablet  Urinary incontinence, unspecified type - Plan: mirabegron ER (MYRBETRIQ) 25 MG TB24 tablet  Pruritus of both eyes - Plan: olopatadine (PATANOL) 0.1 % ophthalmic solution  Nausea and vomiting, intractability of vomiting not specified, unspecified vomiting type - Plan: ondansetron (ZOFRAN) 4 MG tablet  Hypokalemia - Plan: potassium chloride (KLOR-CON) 10 MEQ tablet  Hyperlipidemia, unspecified hyperlipidemia type - Plan: rosuvastatin (CRESTOR) 20 MG tablet, zetia 10  Insomnia, unspecified type - Plan: traZODone (DESYREL) 25-50 MG tablets prn  Hypertension.CAD s/p CABG x 4/PVD associated with diabetes ([place]) A1C 6.7 from 6.5- Plan: Comprehensive metabolic panel, Lipid panel, Hemoglobin A1c, CBC w/Diff, Microalbumin / creatinine urine ratio on zetia 10, lasix 20 mg qd, losartan 12.5 mg qd, Toprol xl 50 mg qd, imdur 30 mg qd crestor 20 mg qd and xarelto 2.5 mg bid per cards Labs today she is fasting  B12 deficiency - Plan: Vitamin B12  Vitamin D deficiency - Plan: Vitamin D (25 hydroxy)  Breast mass, right - Plan: MM DIAG BREAST TOMO BILATERAL, US BREAST LTD UNI RIGHT INC AXILLA  Ordered dx b/l mammo and right breast US due 11/07/21 F/u with surgery as well     Provider: Dr. Olivia Mackie [name]-Internal Medicine

## 2021-01-11 LAB — MICROALBUMIN / CREATININE URINE RATIO
Creatinine, Urine: 52 mg/dL (ref 20–275)
Microalb Creat Ratio: 4 mcg/mg creat (ref <30)
Microalb, Ur: 0.2 mg/dL

## 2021-01-14 ENCOUNTER — Encounter: Payer: Self-pay | Admitting: Internal Medicine

## 2021-01-14 DIAGNOSIS — N6310 Unspecified lump in the right breast, unspecified quadrant: Secondary | ICD-10-CM

## 2021-01-14 DIAGNOSIS — M25561 Pain in right knee: Secondary | ICD-10-CM | POA: Diagnosis not present

## 2021-01-14 DIAGNOSIS — M6281 Muscle weakness (generalized): Secondary | ICD-10-CM | POA: Diagnosis not present

## 2021-01-14 DIAGNOSIS — E559 Vitamin D deficiency, unspecified: Secondary | ICD-10-CM | POA: Insufficient documentation

## 2021-01-14 DIAGNOSIS — Z0000 Encounter for general adult medical examination without abnormal findings: Secondary | ICD-10-CM

## 2021-01-14 DIAGNOSIS — M545 Low back pain, unspecified: Secondary | ICD-10-CM | POA: Diagnosis not present

## 2021-01-14 HISTORY — DX: Unspecified lump in the right breast, unspecified quadrant: N63.10

## 2021-01-14 HISTORY — DX: Encounter for general adult medical examination without abnormal findings: Z00.00

## 2021-01-14 MED ORDER — METFORMIN HCL ER 500 MG PO TB24: 500.0000 mg | ORAL_TABLET | Freq: Every day | ORAL | 3 refills | Status: DC

## 2021-01-16 ENCOUNTER — Telehealth: Payer: Self-pay

## 2021-01-16 NOTE — Telephone Encounter (Signed)
Pt called and states that she wants to know how she can improve her WBC. Please advise

## 2021-01-16 NOTE — Telephone Encounter (Signed)
Patient informed and verbalized understanding.   Patient states that the place on her left upper arm has raised into a bump again then burst, oozing brown drainage/puss. Informed the Patient that there were no appointments in office this week and this needs to be evaluated to be sure this is not another infection. Patient has had this open space on her arms for months. Informed that since it has raised up from being sunken in and burst she needs to be seen at the urgent care or ED. Patient verbalized understanding and will be seen at Urgent care.   Patient would like to save this message for Dr Olivia Mackie [name] to return 01/22/21 so that she may inquire about possible dermatology referral.  Please advise

## 2021-01-16 NOTE — Telephone Encounter (Signed)
According to Dr Olivia Mackie [name] note on CBC: Blood cts normal nonspecific elevated lymphocytes will monitor in future.  Lymphocytes Relative 12.0 - 46.0 % 49.2 High     Please advise, does this need to wait for Dr Olivia Mackie [name]?

## 2021-01-17 DIAGNOSIS — D352 Benign neoplasm of pituitary gland: Secondary | ICD-10-CM | POA: Diagnosis not present

## 2021-01-17 LAB — HM DIABETES EYE EXAM

## 2021-01-18 ENCOUNTER — Encounter: Payer: Self-pay | Admitting: *Deleted

## 2021-01-21 ENCOUNTER — Other Ambulatory Visit: Payer: Self-pay | Admitting: Cardiovascular Disease

## 2021-01-21 DIAGNOSIS — M6281 Muscle weakness (generalized): Secondary | ICD-10-CM | POA: Diagnosis not present

## 2021-01-21 DIAGNOSIS — M545 Low back pain, unspecified: Secondary | ICD-10-CM | POA: Diagnosis not present

## 2021-01-21 DIAGNOSIS — M25561 Pain in right knee: Secondary | ICD-10-CM | POA: Diagnosis not present

## 2021-01-21 NOTE — Telephone Encounter (Signed)
Would Patient need a referral to dermatology for ongoing issues or does she need to come back in to our office?

## 2021-01-22 ENCOUNTER — Other Ambulatory Visit (HOSPITAL_COMMUNITY): Payer: Self-pay | Admitting: Neurosurgery

## 2021-01-22 ENCOUNTER — Other Ambulatory Visit: Payer: Self-pay | Admitting: Family Medicine

## 2021-01-22 ENCOUNTER — Other Ambulatory Visit: Payer: Self-pay | Admitting: Neurosurgery

## 2021-01-22 DIAGNOSIS — D497 Neoplasm of unspecified behavior of endocrine glands and other parts of nervous system: Secondary | ICD-10-CM

## 2021-01-22 DIAGNOSIS — M25511 Pain in right shoulder: Secondary | ICD-10-CM | POA: Diagnosis not present

## 2021-01-22 DIAGNOSIS — M5441 Lumbago with sciatica, right side: Secondary | ICD-10-CM | POA: Diagnosis not present

## 2021-01-22 DIAGNOSIS — G8929 Other chronic pain: Secondary | ICD-10-CM | POA: Diagnosis not present

## 2021-01-22 DIAGNOSIS — M5442 Lumbago with sciatica, left side: Secondary | ICD-10-CM | POA: Diagnosis not present

## 2021-01-22 DIAGNOSIS — M542 Cervicalgia: Secondary | ICD-10-CM | POA: Diagnosis not present

## 2021-01-22 DIAGNOSIS — M48062 Spinal stenosis, lumbar region with neurogenic claudication: Secondary | ICD-10-CM | POA: Diagnosis not present

## 2021-01-22 DIAGNOSIS — M21612 Bunion of left foot: Secondary | ICD-10-CM | POA: Diagnosis not present

## 2021-01-22 DIAGNOSIS — M5412 Radiculopathy, cervical region: Secondary | ICD-10-CM | POA: Diagnosis not present

## 2021-01-22 DIAGNOSIS — M9931 Osseous stenosis of neural canal of cervical region: Secondary | ICD-10-CM | POA: Diagnosis not present

## 2021-01-23 DIAGNOSIS — M545 Low back pain, unspecified: Secondary | ICD-10-CM | POA: Diagnosis not present

## 2021-01-23 DIAGNOSIS — M25561 Pain in right knee: Secondary | ICD-10-CM | POA: Diagnosis not present

## 2021-01-23 DIAGNOSIS — M6281 Muscle weakness (generalized): Secondary | ICD-10-CM | POA: Diagnosis not present

## 2021-01-25 DIAGNOSIS — G4733 Obstructive sleep apnea (adult) (pediatric): Secondary | ICD-10-CM | POA: Diagnosis not present

## 2021-01-25 DIAGNOSIS — I259 Chronic ischemic heart disease, unspecified: Secondary | ICD-10-CM | POA: Diagnosis not present

## 2021-01-25 DIAGNOSIS — G4710 Hypersomnia, unspecified: Secondary | ICD-10-CM | POA: Diagnosis not present

## 2021-01-28 ENCOUNTER — Encounter: Payer: Self-pay | Admitting: Internal Medicine

## 2021-01-29 ENCOUNTER — Telehealth: Payer: Self-pay | Admitting: Cardiovascular Disease

## 2021-01-29 NOTE — Telephone Encounter (Signed)
   [place] Pre-operative Risk Assessment    Patient Name: [name]  DOB: Jun 01, 1961  MRN: [id number]   HEARTCARE STAFF: - Please ensure there is not already an duplicate clearance open for this procedure. - Under Visit Info/Reason for Call, type in Other and utilize the format Clearance MM/DD/YY or Clearance TBD. Do not use dashes or single digits. - If request is for dental extraction, please clarify the # of teeth to be extracted. - If the patient is currently at the dentist's office, call Pre-Op APP to address. If the patient is not currently in the dentist office, please route to the Pre-Op pool  Request for surgical clearance:  What type of surgery is being performed? Cervical epidural steroid injection    When is this surgery scheduled? 02/07/21   What type of clearance is required (medical clearance vs. Pharmacy clearance to hold med vs. Both)? both  Are there any medications that need to be held prior to surgery and how long? hold Xarelto for  48 hours prior.  May continue after procedure.  Practice name and name of physician performing surgery? [facility]    What is the office phone number? [phone]   7.   What is the office fax number? [phone]  8.   Anesthesia type (None, local, MAC, general) ? Not listed    Ace Gins 01/29/2021, 11:14 AM  _________________________________________________________________   (provider comments below)

## 2021-01-29 NOTE — Telephone Encounter (Signed)
Xarelto 2.5 mg bid for CAD not part of the pharmacy protocol for anticoagulation.  Will need to defer to Dr. [name].  (We usually hold anticoagulation for 3 days for any spinal procedures

## 2021-02-04 NOTE — Telephone Encounter (Signed)
Left VM  Per Dr. [name] -  Should be fine to hold low-dose Xarelto for 2 days prior to procedure  Can hold 3 days if they need to but note indicates 2 days needed

## 2021-02-05 ENCOUNTER — Other Ambulatory Visit: Payer: Self-pay

## 2021-02-05 ENCOUNTER — Ambulatory Visit
Admission: RE | Admit: 2021-02-05 | Discharge: 2021-02-05 | Disposition: A | Discharge status: Home / Self Care | Payer: Medicare HMO | Source: Ambulatory Visit | Attending: Neurosurgery | Admitting: Neurosurgery

## 2021-02-05 DIAGNOSIS — D497 Neoplasm of unspecified behavior of endocrine glands and other parts of nervous system: Secondary | ICD-10-CM | POA: Diagnosis not present

## 2021-02-05 DIAGNOSIS — G9389 Other specified disorders of brain: Secondary | ICD-10-CM | POA: Diagnosis not present

## 2021-02-05 MED ORDER — GADOBUTROL 1 MMOL/ML IV SOLN
10.0000 mL | Freq: Once | INTRAVENOUS | Status: AC | PRN
  Administered 2021-02-05: 10 mL via INTRAVENOUS

## 2021-02-06 NOTE — Telephone Encounter (Signed)
[facility] clinic physiatry calling to check on status

## 2021-02-07 ENCOUNTER — Ambulatory Visit: Payer: Medicare HMO | Admitting: Pulmonary Disease

## 2021-02-07 NOTE — Telephone Encounter (Signed)
   Left voicemail for patient to call back for ongoing preop assessment.   Abigail Butts, PA-C 02/07/21, 10:08 AM

## 2021-02-11 DIAGNOSIS — G4733 Obstructive sleep apnea (adult) (pediatric): Secondary | ICD-10-CM | POA: Diagnosis not present

## 2021-02-12 ENCOUNTER — Encounter: Payer: Self-pay | Admitting: Cardiovascular Disease

## 2021-02-12 ENCOUNTER — Ambulatory Visit (INDEPENDENT_AMBULATORY_CARE_PROVIDER_SITE_OTHER): Payer: Medicare HMO | Admitting: Cardiovascular Disease

## 2021-02-12 ENCOUNTER — Other Ambulatory Visit: Payer: Self-pay

## 2021-02-12 ENCOUNTER — Ambulatory Visit
Admission: RE | Admit: 2021-02-12 | Discharge: 2021-02-12 | Disposition: A | Discharge status: Home / Self Care | Payer: Medicare HMO | Source: Ambulatory Visit | Attending: Internal Medicine | Admitting: Internal Medicine

## 2021-02-12 VITALS — BP 126/82 | HR 63 | Ht 65.0 in | Wt 224.1 lb

## 2021-02-12 DIAGNOSIS — R2231 Localized swelling, mass and lump, right upper limb: Secondary | ICD-10-CM | POA: Insufficient documentation

## 2021-02-12 DIAGNOSIS — I2510 Atherosclerotic heart disease of native coronary artery without angina pectoris: Secondary | ICD-10-CM | POA: Diagnosis not present

## 2021-02-12 DIAGNOSIS — E876 Hypokalemia: Secondary | ICD-10-CM | POA: Diagnosis not present

## 2021-02-12 DIAGNOSIS — I25118 Atherosclerotic heart disease of native coronary artery with other forms of angina pectoris: Secondary | ICD-10-CM

## 2021-02-12 DIAGNOSIS — I5042 Chronic combined systolic (congestive) and diastolic (congestive) heart failure: Secondary | ICD-10-CM

## 2021-02-12 DIAGNOSIS — I255 Ischemic cardiomyopathy: Secondary | ICD-10-CM

## 2021-02-12 DIAGNOSIS — E785 Hyperlipidemia, unspecified: Secondary | ICD-10-CM | POA: Diagnosis not present

## 2021-02-12 DIAGNOSIS — I10 Essential (primary) hypertension: Secondary | ICD-10-CM | POA: Diagnosis not present

## 2021-02-12 DIAGNOSIS — I739 Peripheral vascular disease, unspecified: Secondary | ICD-10-CM

## 2021-02-12 MED ORDER — XARELTO 2.5 MG PO TABS: 2.5000 mg | ORAL_TABLET | Freq: Two times a day (BID) | ORAL | 3 refills | Status: DC

## 2021-02-12 MED ORDER — REPATHA SURECLICK 140 MG/ML ~~LOC~~ SOAJ: SUBCUTANEOUS | 4 refills | Status: DC

## 2021-02-12 MED ORDER — METOPROLOL SUCCINATE ER 50 MG PO TB24: ORAL_TABLET | ORAL | 3 refills | Status: DC

## 2021-02-12 MED ORDER — ROSUVASTATIN CALCIUM 20 MG PO TABS: 20.0000 mg | ORAL_TABLET | Freq: Every day | ORAL | 3 refills | Status: DC

## 2021-02-12 MED ORDER — EZETIMIBE 10 MG PO TABS: 10.0000 mg | ORAL_TABLET | Freq: Every day | ORAL | 3 refills | Status: DC

## 2021-02-12 MED ORDER — LOSARTAN POTASSIUM 25 MG PO TABS: 12.5000 mg | ORAL_TABLET | Freq: Every day | ORAL | 3 refills | Status: DC

## 2021-02-12 MED ORDER — FUROSEMIDE 20 MG PO TABS: ORAL_TABLET | ORAL | 3 refills | Status: DC

## 2021-02-12 MED ORDER — ISOSORBIDE MONONITRATE ER 30 MG PO TB24: 30.0000 mg | ORAL_TABLET | Freq: Two times a day (BID) | ORAL | 3 refills | Status: DC

## 2021-02-12 MED ORDER — NITROGLYCERIN 0.4 MG SL SUBL: 0.4000 mg | SUBLINGUAL_TABLET | SUBLINGUAL | 4 refills | Status: DC | PRN

## 2021-02-12 MED ORDER — POTASSIUM CHLORIDE CRYS ER 10 MEQ PO TBCR
10.0000 meq | EXTENDED_RELEASE_TABLET | Freq: Every day | ORAL | 3 refills | Status: DC
Start: 2021-02-12 — End: 2021-12-06

## 2021-02-12 NOTE — Patient Instructions (Addendum)
Medication Instructions:  No changes  If you need a refill on your cardiac medications before your next appointment, please call your pharmacy.   Lab work: No new labs needed  Testing/Procedures: No new testing needed   Follow-Up: At [facility], you and your health needs are our priority.  As part of our continuing mission to provide you with exceptional heart care, we have created designated Provider Care Teams.  These Care Teams include your primary Cardiologist (physician) and Advanced Practice Providers (APPs -  Physician Assistants and Nurse Practitioners) who all work together to provide you with the care you need, when you need it.  You will need a follow up appointment in 6 months  Providers on your designated Care Team:   Christopher Berge, NP Ryan Dunn, PA-C Jacquelyn Visser, PA-C Cadence Furth, PA-C   COVID-19 Vaccine Information can be found at: [email or web address] For questions related to vaccine distribution or appointments, please email [email or web address] or call [phone].    

## 2021-02-12 NOTE — Progress Notes (Signed)
Cardiology Office Note  Date:  02/12/2021   ID:  [name], DOB 1960-09-26, MRN [id number]  PCP:  [name], [name], MD   Chief Complaint  Patient presents with   Other    6 month f/u c/o mid back pain radiated to front of chest discomfort. Meds reviewed verbally with pt.    HPI:  60 yo woman with a  long smoking history,   CAD s/p  NSTEMI at [facility]  1/12, with BMS to pLAD (80% stenosis),  residual severe stenosis of the RCA/nondominant small vessel,  continued chest pain,  cardiac catheterization showing distal left main stenosis involving a high grade ostial LAD stenosis and circumflex disease approximately 90%, 90% diagonal disease, 80% nondominant RCA disease with ejection fraction 45%,  CABG x 4 in 2012 with a LIMA to the LAD, vein graft to the diagonal, vein graft to the marginal and vein graft to the RCA,   peripheral vascular disease,  S/p atherectomy and PTA of her right SFA on 01/15/11,   outpatient stress test  July 2013 , OSA who presents for routine followup of her coronary artery disease  LOV 09/2019 May need to Hold Xarelto prior to cervical epidural steroid injection Having U/S first Frozen shoulder on right  Lab work reviewed A1c 6.7 Total cholesterol 133 LDL 56 Vitamin D low, has not started OTC vit D  Rib discomfort, back to front Atypical chest pain Denies any significant shortness of breath or chest pain on exertion concerning for angina  EKG personally reviewed by myself on todays visit Shows normal sinus rhythm with rate 63 bpm T-wave abnormality precordial leads, no change from prior EKG  Ejection fraction low 40 to 45% in September 2018 Taking Lasix daily  Prior  outpatient stress test  July 2013 showed significant breast attenuation artifact, inferior wall artifact from GI uptake. Overall no significant ischemia. History of chronic left arm and upper left chest  pain and was seen pain clinic. Prior diagnosis of carpal tunnel syndrome   occasional left breast pain radiating to her mediastinum.    Previously seen by ear nose throat for vestibular testing as a cause of her dizziness.  EGD and colonoscopy which by her report showed hiatal hernia. Prior problems with swallowing  She has had MRI of the neck showing cervical disc disease   Echocardiogram in the hospital showed normal LV systolic function ejection fraction 50-55%, mild MR, mild to moderate TR with right ventricular systolic pressures 19-50  PMH:   has a past medical history of Allergy, Anemia, Arthritis, Asthma, Bacterial vaginitis, Blood in stool, Brachial neuritis or radiculitis NOS, Brain tumor (benign) ([place]) (07/2017), Bronchitis, Cardiac arrhythmia, Cervical neck pain with evidence of disc disease, Chronic combined systolic and diastolic CHF (congestive heart failure) ([place]), Chronic pain, Cocaine abuse, in remission ([place]), Colon polyps, COPD (chronic obstructive pulmonary disease) ([place]), Coronary artery disease, Depression, Diabetes mellitus without complication ([place]), Family history of colon cancer, Generalized headaches, GERD (gastroesophageal reflux disease), Headache, Heart murmur, Hematochezia, Hepatitis, History of cervical cancer, History of drug abuse ([place]), History of hepatitis B, History of MI (myocardial infarction), Hyperlipidemia, Hypertension, Ischemic cardiomyopathy, Myocardial infarction ([place]) (2003, 2012), PAD (peripheral artery disease) ([place]), 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  PSH:    Past Surgical History:  Procedure Laterality Date   ABDOMINAL HYSTERECTOMY     2003   CHOLECYSTECTOMY  1986   COLONOSCOPY  2008   3 polyps  COLONOSCOPY WITH PROPOFOL N/A 02/06/2015   Procedure: COLONOSCOPY WITH PROPOFOL;  Surgeon: Lucilla Lame, MD;  Location: [facility] ENDOSCOPY;  Service: Endoscopy;  Laterality: N/A;   COLONOSCOPY WITH PROPOFOL N/A 01/27/2017   Procedure:  COLONOSCOPY WITH PROPOFOL;  Surgeon: Lucilla Lame, MD;  Location: [facility] END[facility]COPY;  Service: Endoscopy;  Laterality: N/A;   CORONARY ANGIOPLASTY     w/ stent placement x2   CORONARY ARTERY BYPASS GRAFT  2012   Dr [name]   CORONARY STENT PLACEMENT  2003   S/P MI   CORONARY STENT PLACEMENT  2007   [place]   ESOPHAGOGASTRODUODENOSCOPY (EGD) WITH PROPOFOL N/A 02/06/2015   Procedure: ESOPHAGOGASTRODUODENOSCOPY (EGD) WITH PROPOFOL;  Surgeon: Lucilla Lame, MD;  Location: [facility] ENDOSCOPY;  Service: Endoscopy;  Laterality: N/A;   ESOPHAGOGASTRODUODENOSCOPY (EGD) WITH PROPOFOL N/A 01/27/2017   Procedure: ESOPHAGOGASTRODUODENOSCOPY (EGD) WITH PROPOFOL;  Surgeon: Lucilla Lame, MD;  Location: [facility] ENDOSCOPY;  Service: Endoscopy;  Laterality: N/A;   ESOPHAGOGASTRODUODENOSCOPY (EGD) WITH PROPOFOL N/A 09/01/2019   Procedure: ESOPHAGOGASTRODUODENOSCOPY (EGD) WITH PROPOFOL;  Surgeon: Lin Landsman, MD;  Location: [place];  Service: Gastroenterology;  Laterality: N/A;   FEMORAL ARTERY STENT  10/2010   right sided (Dr. Burt Knack)   KNEE ARTHROSCOPY WITH MEDIAL MENISECTOMY Right 08/20/2017   Procedure: KNEE ARTHROSCOPY WITH MEDIAL  AND LATERAL MENISECTOMY;  Surgeon: Hessie Knows, MD;  Location: [facility] ORS;  Service: Orthopedics;  Laterality: Right;   POSTERIOR CERVICAL LAMINECTOMY N/A 03/08/2018   Procedure: POSTERIOR CERVICAL LAMINECTOMY-C7;  Surgeon: Deetta Perla, MD;  Location: [facility] ORS;  Service: Neurosurgery;  Laterality: N/A;   TONSILLECTOMY     TOTAL KNEE ARTHROPLASTY Right 04/14/2019   Procedure: RIGHT TOTAL KNEE ARTHROPLASTY;  Surgeon: Hessie Knows, MD;  Location: [facility] ORS;  Service: Orthopedics;  Laterality: Right;   TUBAL LIGATION      Current Outpatient Medications  Medication Sig Dispense Refill   albuterol (VENTOLIN HFA) 108 (90 Base) MCG/ACT inhaler Inhale 2 puffs into the lungs every 6 (six) hours as needed for wheezing or shortness of breath. 1 g 12   aspirin EC 81 MG tablet Take 1 tablet (81  mg total) by mouth daily. 90 tablet 3   Azelastine HCl 0.15 % SOLN Place 2 sprays into both nostrils daily as needed (allergies). 30 mL 11   cyanocobalamin (,VITAMIN B-12,) 1000 MCG/ML injection Inject 1 mL (1,000 mcg total) into the muscle every 30 (thirty) days. 4 mL 3   cyclobenzaprine (FLEXERIL) 5 MG tablet Take 1-2 tablets (5-10 mg total) by mouth daily as needed for muscle spasms. 60 tablet 11   dicyclomine (BENTYL) 10 MG capsule Take 1 capsule (10 mg total) by mouth 3 (three) times daily before meals. As needed for abdominal pain 120 capsule 11   esomeprazole (NEXIUM) 40 MG capsule Take 1 capsule (40 mg total) by mouth daily. 90 capsule 3   ezetimibe (ZETIA) 10 MG tablet Take 1 tablet (10 mg total) by mouth daily. 90 tablet 3   fluticasone (FLONASE) 50 MCG/ACT nasal spray Place 2 sprays into both nostrils daily as needed for allergies. Max dose 2 sprays each nostril 16 g 11   Fluticasone-Umeclidin-Vilant (TRELEGY ELLIPTA) 100-62.5-25 MCG/INH AEPB INHALE 1 PUFF ONCE DAILY 60 each 11   furosemide (LASIX) 20 MG tablet 1 tablet once qd and 2nd tablet if needed fluid build up prn 180 tablet 3   hydrOXYzine (ATARAX/VISTARIL) 25 MG tablet TAKE 1 TO 2 TABLETS BY MOUTH ONCE DAILY AS NEEDED 60 tablet 11   ipratropium-albuterol (DUONEB) 0.5-2.5 (3) MG/3ML SOLN  Take 3 mLs by nebulization 2 (two) times daily as needed (wheezing/shortness of breath). 360 mL 11   isosorbide mononitrate (IMDUR) 30 MG 24 hr tablet Take 1 tablet by mouth twice daily 180 tablet 0   Lancets (ONETOUCH DELICA PLUS [id number]) MISC 1 Device by Does not apply route in the morning and at bedtime. 180 each 3   levocetirizine (XYZAL) 5 MG tablet Take 1 tablet (5 mg total) by mouth at bedtime as needed for allergies. 90 tablet 3   linaclotide (LINZESS) 72 MCG capsule TAKE 1 CAPSULE BY MOUTH ONCE DAILY BEFORE BREAKFAST 90 capsule 3   losartan (COZAAR) 25 MG tablet Take 0.5 tablets (12.5 mg total) by mouth daily. 45 tablet 3   meclizine  (ANTIVERT) 25 MG tablet TAKE 1 TABLET BY MOUTH TWICE DAILY AS NEEDED FOR DIZZINESS 60 tablet 11   metFORMIN (GLUCOPHAGE XR) 500 MG 24 hr tablet Take 1 tablet (500 mg total) by mouth daily with breakfast. 90 tablet 3   metoprolol succinate (TOPROL-XL) 50 MG 24 hr tablet TAKE 1 TABLET BY MOUTH ONCE DAILY WITH OR IMMEDIATELY FOLLOWING A MEAL 90 tablet 3   mirabegron ER (MYRBETRIQ) 25 MG TB24 tablet Take 1 tablet (25 mg total) by mouth daily. 90 tablet 3   montelukast (SINGULAIR) 10 MG tablet Take 1 tablet (10 mg total) by mouth at bedtime. 90 tablet 3   mupirocin ointment (BACTROBAN) 2 % Apply 1 application topically 2 (two) times daily. Both nostrils (nose) x 5 days 30 g 0   NEEDLE, DISP, 25 G 25G X 1-1/2" MISC 1 Device by Does not apply route every 30 (thirty) days. 4 each 3   nitroGLYCERIN (NITROSTAT) 0.4 MG SL tablet Place 1 tablet (0.4 mg total) under the tongue every 5 (five) minutes as needed. 25 tablet 6   olopatadine (PATANOL) 0.1 % ophthalmic solution Place 1 drop into both eyes 2 (two) times daily. 5 mL 11   ondansetron (ZOFRAN) 4 MG tablet Take 1 tablet (4 mg total) by mouth every 8 (eight) hours as needed for nausea or vomiting. 20 tablet 5   ONETOUCH VERIO test strip USE AS DIRECTED 200 each 3   PARoxetine (PAXIL) 20 MG tablet Take 1 tablet (20 mg total) by mouth daily. 90 tablet 3   potassium chloride (KLOR-CON) 10 MEQ tablet Take 1 tablet (10 mEq total) by mouth daily. 90 tablet 3   pregabalin (LYRICA) 75 MG capsule Take 1 capsule (75 mg total) by mouth 2 (two) times daily. 60 capsule 5   REPATHA SURECLICK 176 MG/ML SOAJ INJECT 1 PEN INTO THE SKIN EVERY 14 DAYS 6 mL 0   rosuvastatin (CRESTOR) 20 MG tablet Take 1 tablet (20 mg total) by mouth at bedtime. 90 tablet 3   sodium chloride (OCEAN) 0.65 % SOLN nasal spray Place 2 sprays into both nostrils daily as needed for congestion. 30 mL 11   traZODone (DESYREL) 50 MG tablet Take 0.5-1 tablets (25-50 mg total) by mouth at bedtime as needed  for sleep. 90 tablet 3   XARELTO 2.5 MG TABS tablet Take 1 tablet by mouth twice daily 180 tablet 1   No current facility-administered medications for this visit.    Allergies:   Latex, Shellfish allergy, Sulfonamide derivatives, Watermelon [citrullus vulgaris], Soy allergy, Tomato, and Other   Social History:  The patient  reports that she quit smoking about 10 years ago. Her smoking use included cigarettes. She has a 38.00 pack-year smoking history. She has never used smokeless tobacco.  She reports that she does not drink alcohol and does not use drugs.   Family History:   family history includes Acute myelogenous leukemia in her grandson; Arthritis in her sister; Brain cancer in her sister; Breast cancer (age of onset: 57) in her mother; Cancer in her father and mother; Colon cancer (age of onset: 72) in her father; Diabetes in her brother, father, and sister; Glaucoma in her father; Heart disease in her father; Heart failure in her father; Hypertension in her brother, father, and sister; Kidney disease in her sister; Other in her father and sister.    Review of Systems: Review of Systems  Constitutional: Negative.   HENT: Negative.    Respiratory: Negative.    Cardiovascular: Negative.   Musculoskeletal:  Positive for back pain and joint pain.  Neurological: Negative.   Psychiatric/Behavioral: Negative.    All other systems reviewed and are negative.  PHYSICAL EXAM: VS:  BP 126/82 (BP Location: Left Arm, Patient Position: Sitting, Cuff Size: Normal)   Pulse 63   Ht 5\' 5"  (1.651 m)   Wt 224 lb 2 oz (101.7 kg)   SpO2 98%   BMI 37.30 kg/m  , BMI Body mass index is 37.3 kg/m. Constitutional:  oriented to person, place, and time. No distress.  HENT:  Head: Grossly normal Eyes:  no discharge. No scleral icterus.  Neck: No JVD, no carotid bruits  Cardiovascular: Regular rate and rhythm, no murmurs appreciated Pulmonary/Chest: Clear to auscultation bilaterally, no wheezes or  rails Abdominal: Soft.  no distension.  no tenderness.  Musculoskeletal: Normal range of motion Neurological:  normal muscle tone. Coordination normal. No atrophy Skin: Skin warm and dry Psychiatric: normal affect, pleasant   Recent Labs: 07/24/2020: TSH 0.73 01/10/2021: ALT 18; BUN 10; Creatinine, Ser 1.03; Hemoglobin 14.4; Platelets 168.0; Potassium 3.7; Sodium 142    Lipid Panel Lab Results  Component Value Date   CHOL 133 01/10/2021   HDL 51.40 01/10/2021   LDLCALC 56 01/10/2021   TRIG 130.0 01/10/2021      Wt Readings from Last 3 Encounters:  02/12/21 224 lb 2 oz (101.7 kg)  01/10/21 225 lb (102.1 kg)  10/16/20 225 lb (102.1 kg)     ASSESSMENT AND PLAN:   CAD/ CABG: With stable angina Continue aspirin with Xarelto 2.5 mg BID   has PAD , CABG Currently with no symptoms of angina. No further workup at this time. Continue current medication regimen. Atypical chest pain, from rib issue  Ischemic cardiomyopathy Losartan 12.5, lasix daily,  metoprolol succinate 50 mg daily Previously with cough on ACE inhibitor No changes to medications  Hyperlipidemia Cholesterol is at goal on the current lipid regimen. No changes to the medications were made.  Secondary hypertension, unspecified -  Blood pressure is well controlled on today's visit. No changes made to the medications.  OSA on CPAP  Previously seen by pulmonary Tested positive per the patient On CPAP  COPD   stopped smoking several years ago breathing stable  Chronic pain Chest, legs, back, arms Managed by chiropractic, massage Followed by psyiatry  Leg swelling On lasix daily, euvolemic BMP stable   Total encounter time more than 25 minutes  Greater than 50% was spent in counseling and coordination of care with the patient   Orders Placed This Encounter  Procedures   EKG 12-Lead     Signed, Esmond Plants, M.D., Ph.D. 02/12/2021  [place],  [place]

## 2021-02-12 NOTE — Telephone Encounter (Signed)
   Primary Cardiologist: Ida Rogue, MD  Chart reviewed as part of pre-operative protocol coverage. Given past medical history and time since last visit, based on ACC/AHA guidelines, [name] would be at acceptable risk for the planned procedure without further cardiovascular testing.   Per Dr. [name] she may hold her Xarelto for 2 days prior to her procedure.  Please resume as soon as hemostasis is achieved.  I will route this recommendation to the requesting party via Epic fax function and remove from pre-op pool.  Please call with questions.  Lorraine Ng. Tausha Milhoan NP-C    02/12/2021, 10:04 AM [place] [address] Office [phone] Fax [phone]

## 2021-02-15 DIAGNOSIS — M6281 Muscle weakness (generalized): Secondary | ICD-10-CM | POA: Diagnosis not present

## 2021-02-15 DIAGNOSIS — M545 Low back pain, unspecified: Secondary | ICD-10-CM | POA: Diagnosis not present

## 2021-02-15 DIAGNOSIS — M25561 Pain in right knee: Secondary | ICD-10-CM | POA: Diagnosis not present

## 2021-02-21 DIAGNOSIS — M25561 Pain in right knee: Secondary | ICD-10-CM | POA: Diagnosis not present

## 2021-02-21 DIAGNOSIS — M545 Low back pain, unspecified: Secondary | ICD-10-CM | POA: Diagnosis not present

## 2021-02-21 DIAGNOSIS — M6281 Muscle weakness (generalized): Secondary | ICD-10-CM | POA: Diagnosis not present

## 2021-02-24 ENCOUNTER — Other Ambulatory Visit: Payer: Self-pay | Admitting: Internal Medicine

## 2021-02-24 DIAGNOSIS — D72820 Lymphocytosis (symptomatic): Secondary | ICD-10-CM

## 2021-02-24 NOTE — Addendum Note (Signed)
Addended by: Orland Mustard on: 02/24/2021 11:01 PM   Modules accepted: Orders

## 2021-02-24 NOTE — Telephone Encounter (Signed)
[facility] dermatology referral Dr. Lanier Prude Rasheedah please  [name] have pt come in for repeat lymphocyte lab order in

## 2021-02-25 ENCOUNTER — Telehealth: Payer: Self-pay

## 2021-02-25 NOTE — Telephone Encounter (Signed)
Agree Dr. Gayland Curry

## 2021-02-25 NOTE — Telephone Encounter (Signed)
Patient informed and verbalized understanding

## 2021-02-25 NOTE — Telephone Encounter (Signed)
Patient scheduled for labs 2:45 pm tomorrow 02/26/21. Patient states she received a call earlier from our office but did not understand her lab results. Went over the following with the Patient.   Urine no protein  Kidney #s improved continue to drive water [id number] ounces avoids nsaids  Cholesterol normal great work  A1C increased 6.5 to 6.7 Rec healthy diet and exercise  B12 normal  Vitamin D low rec vitamin D3 5000 Iu daily otc Blood cts normal nonspecific elevated lymphocytes will monitor in future

## 2021-02-25 NOTE — Telephone Encounter (Signed)
Called and spoke to [place] to give lab results. [name] states that she has had neck and ear pain since 02/24/21. Pt states that it could be due to having water in her ear or a possible infection. Pt states that it is a 10 on the pain scale and that she can not bend her neck. PT states that she has no fever and that her neck is tender to touch. She states that the swelling extends from behind her ear and goes down to the middle of her neck. I informed her that Dr. Olivia Mackie would want her to be evaluated for her ear pain and gave instruction to [facility]. Elleigh verbalized understanding and was agreeable to being seen today at the urgent care.

## 2021-02-26 ENCOUNTER — Other Ambulatory Visit: Payer: Medicare HMO

## 2021-02-26 DIAGNOSIS — M4802 Spinal stenosis, cervical region: Secondary | ICD-10-CM | POA: Diagnosis not present

## 2021-02-26 DIAGNOSIS — E236 Other disorders of pituitary gland: Secondary | ICD-10-CM | POA: Diagnosis not present

## 2021-02-26 DIAGNOSIS — M47812 Spondylosis without myelopathy or radiculopathy, cervical region: Secondary | ICD-10-CM | POA: Diagnosis not present

## 2021-02-26 DIAGNOSIS — M542 Cervicalgia: Secondary | ICD-10-CM | POA: Diagnosis not present

## 2021-02-27 ENCOUNTER — Other Ambulatory Visit (INDEPENDENT_AMBULATORY_CARE_PROVIDER_SITE_OTHER): Payer: Medicare HMO

## 2021-02-27 ENCOUNTER — Other Ambulatory Visit: Payer: Self-pay

## 2021-02-27 DIAGNOSIS — D72820 Lymphocytosis (symptomatic): Secondary | ICD-10-CM

## 2021-02-27 NOTE — Telephone Encounter (Signed)
Noted  

## 2021-02-28 LAB — CBC WITH DIFFERENTIAL/PLATELET
Basophils Absolute: 0.1 10*3/uL (ref 0.0–0.1)
Basophils Relative: 0.8 % (ref 0.0–3.0)
Eosinophils Absolute: 0.1 10*3/uL (ref 0.0–0.7)
Eosinophils Relative: 1.6 % (ref 0.0–5.0)
HCT: 42.1 % (ref 36.0–46.0)
Hemoglobin: 14.1 g/dL (ref 12.0–15.0)
Lymphocytes Relative: 39.9 % (ref 12.0–46.0)
Lymphs Abs: 3 10*3/uL (ref 0.7–4.0)
MCHC: 33.4 g/dL (ref 30.0–36.0)
MCV: 95.7 fl (ref 78.0–100.0)
Monocytes Absolute: 0.6 10*3/uL (ref 0.1–1.0)
Monocytes Relative: 7.7 % (ref 3.0–12.0)
Neutro Abs: 3.7 10*3/uL (ref 1.4–7.7)
Neutrophils Relative %: 50 % (ref 43.0–77.0)
Platelets: 151 10*3/uL (ref 150.0–400.0)
RBC: 4.4 Mil/uL (ref 3.87–5.11)
RDW: 13.6 % (ref 11.5–15.5)
WBC: 7.4 10*3/uL (ref 4.0–10.5)

## 2021-03-01 DIAGNOSIS — G4733 Obstructive sleep apnea (adult) (pediatric): Secondary | ICD-10-CM | POA: Diagnosis not present

## 2021-03-01 DIAGNOSIS — I259 Chronic ischemic heart disease, unspecified: Secondary | ICD-10-CM | POA: Diagnosis not present

## 2021-03-01 DIAGNOSIS — G4710 Hypersomnia, unspecified: Secondary | ICD-10-CM | POA: Diagnosis not present

## 2021-03-05 ENCOUNTER — Other Ambulatory Visit: Payer: Self-pay | Admitting: Cardiovascular Disease

## 2021-03-06 NOTE — Telephone Encounter (Signed)
Prescription refill request for Xarelto received.  Indication: CAD Last office visit: 02/12/21  Johnny Bridge MD Weight: 101.7kg Age: 60 Scr: 1.03 CrCl: 90.92  Based on above findings Xarelto 2.'5mg'$  twice daily is the appropriate dose.  Refill approved.

## 2021-03-06 NOTE — Telephone Encounter (Signed)
Refill Request.  

## 2021-03-14 ENCOUNTER — Telehealth: Payer: Self-pay

## 2021-03-14 ENCOUNTER — Encounter: Payer: Self-pay | Admitting: Pulmonary Disease

## 2021-03-14 ENCOUNTER — Other Ambulatory Visit: Payer: Self-pay

## 2021-03-14 ENCOUNTER — Ambulatory Visit (INDEPENDENT_AMBULATORY_CARE_PROVIDER_SITE_OTHER): Payer: Medicare HMO | Admitting: Pulmonary Disease

## 2021-03-14 VITALS — BP 122/72 | HR 74 | Temp 98.2°F | Ht 65.0 in | Wt 220.4 lb

## 2021-03-14 DIAGNOSIS — J449 Chronic obstructive pulmonary disease, unspecified: Secondary | ICD-10-CM | POA: Diagnosis not present

## 2021-03-14 DIAGNOSIS — E119 Type 2 diabetes mellitus without complications: Secondary | ICD-10-CM

## 2021-03-14 DIAGNOSIS — G4733 Obstructive sleep apnea (adult) (pediatric): Secondary | ICD-10-CM | POA: Diagnosis not present

## 2021-03-14 MED ORDER — ALBUTEROL SULFATE HFA 108 (90 BASE) MCG/ACT IN AERS: 2.0000 | INHALATION_SPRAY | Freq: Four times a day (QID) | RESPIRATORY_TRACT | 12 refills | Status: DC | PRN

## 2021-03-14 MED ORDER — TRELEGY ELLIPTA 100-62.5-25 MCG/INH IN AEPB: INHALATION_SPRAY | RESPIRATORY_TRACT | 11 refills | Status: DC

## 2021-03-14 NOTE — Telephone Encounter (Signed)
-----   Message from Leone Haven, MD sent at 03/13/2021  7:19 PM EDT ----- I am fine if [name] calls her and can place a referral if the patient is willing to see you.  ----- Message ----- From: De Hollingshead, RPH-CPP Sent: 03/13/2021   3:05 PM EDT To: Leone Haven, MD, Thressa Sheller, CMA  Hi all,   This patient is on the "may fail the diabetes adherence measure" for metformin. Below is her fill history, which makes me think she is just taking 1 tablet once daily.   METFORMIN ER '500MG'$  TAB 01/14/2021 90 90 each [name], Olivia Mackie.[name] METFORMIN ER '500MG'$  TAB 08/10/2020 90 90 each [name], Olivia Mackie  Dr. Caryl Bis, if you are OK with placing the referral if the patient is interested, would you be OK with Hayla Hinger (since the patient knows her) calling to see how she is taking her metformin, and if she'd be interested in a phone visit with me to go through everything with her medications?  Thanks, team!  Catie

## 2021-03-14 NOTE — Telephone Encounter (Signed)
Called and spoke to patient, pt not currently using cpap machine. Nothing further needed.

## 2021-03-14 NOTE — Progress Notes (Signed)
[name], [name], [name]  Chief Complaint  Patient presents with   Follow-up    Constitutional:  BP 122/72 (BP Location: Left Arm, Patient Position: Sitting, Cuff Size: Normal)   Pulse 74   Temp 98.2 F (36.8 C) (Oral)   Ht '5\' 5"'$  (1.651 m)   Wt 220 lb 6.4 oz (100 kg)   SpO2 100%   BMI 36.68 kg/m   Past Medical History:  HLD, GERD, Depression, HTN, Anxiety, Depression, Allergies, OA, Pituitary adenoma, Combined CHF, CAD s/p CABG, DM type 2, HA, Hep B, Cervical cancer, PAD, Colon polyps  Past Surgical History:  Her  has a past surgical history that includes Coronary artery bypass graft (2012); Coronary stent placement (2003); Coronary stent placement (2007); Femoral artery stent (10/2010); Coronary angioplasty; Colonoscopy (2008); Colonoscopy with propofol (N/A, 02/06/2015); Esophagogastroduodenoscopy (egd) with propofol (N/A, 02/06/2015); Colonoscopy with propofol (N/A, 01/27/2017); Esophagogastroduodenoscopy (egd) with propofol (N/A, 01/27/2017); Tubal ligation; Knee arthroscopy with medial menisectomy (Right, 08/20/2017); Abdominal hysterectomy; Cholecystectomy (1986); Posterior cervical laminectomy (N/A, 03/08/2018); Tonsillectomy; Total knee arthroplasty (Right, 04/14/2019); Lorraine Esophagogastroduodenoscopy (egd) with propofol (N/A, 09/01/2019).  Brief Summary:  [name] is a 60 y.o. female with former smoker with allergic asthma Lorraine rhinitis, Lorraine obstructive sleep apnea.      Subjective:   She was confused about her inhaler regimen.  She has been using trelegy Lorraine this helps.  She thought flovent was her rescue inhaler, Lorraine hadn't been using albuterol.  She hasn't used CPAP for about 1 year.  She has tending to a sick family member Lorraine wasn't able to keep up with CPAP use.  She continues to have trouble with her sleep Lorraine snoring.  She stops breathing at night Lorraine feels tired during the day.  Physical Exam:   Appearance - well kempt   ENMT - no  sinus tenderness, no oral exudate, no LAN, Mallampati 4 airway, no stridor, high arched palate  Respiratory - equal breath sounds bilaterally, no wheezing or rales  CV - s1s2 regular rate Lorraine rhythm, no murmurs  Ext - no clubbing, no edema  Skin - no rashes  Psych - normal mood Lorraine affect    Ward testing:  PFT 11/07/10 >> FEV1 2.73 (110%), FEV1% 80, TLC 5.41 (101%), DLCO 68% PFT 05/16/16 >> FEV1 1.98 (88%), FEV1% 79, TLC 5.10 (98%), DLCO 63%  Chest Imaging:  CT angio chest 06/16/17 >> atherosclerosis, s/p CABG, ATX  Sleep Tests:  PSG 05/06/16 >> AHI 6.8, SpO2 low 87% HST 01/11/19 >> AHI 26.7, SpO2 low 62%  Cardiac Tests:  Echo 04/24/17 >> EF 40 to 45%, grade 1 DD  Social History:  She  reports that she quit smoking about 10 years ago. Her smoking use included cigarettes. She has a 38.00 pack-year smoking history. She has never used smokeless tobacco. She reports that she does not drink alcohol Lorraine does not use drugs.  Family History:  Her family history includes Acute myelogenous leukemia in her grandson; Arthritis in her sister; Brain cancer in her sister; Breast cancer (age of onset: 37) in her mother; Cancer in her father Lorraine mother; Colon cancer (age of onset: 24) in her father; Diabetes in her brother, father, Lorraine sister; Glaucoma in her father; Heart disease in her father; Heart failure in her father; Hypertension in her brother, father, Lorraine sister; Kidney disease in her sister; Other in her father Lorraine sister.     Assessment/Plan:   Allergic asthma. - reviewed the roles for her different inhalers -  continue trelegy 100 one puff daily - continue singulair during allergy seasos - prn albuterol - advised her to stop using anoro Lorraine flovent  Allergic rhinitis. - continue singulair, azelastine, flonase, Lorraine xyzal during allergy seasons  Obstructive sleep apnea. - she has continued snoring, sleep disruption, apnea, Lorraine daytime sleepiness - she will need to have  repeat home sleep study to assess current status of sleep apnea Lorraine then resume CPAP therapy assuming she still has sleep apnea  CAD s/p CABG, HTN, HLD, Ischemic cardiomyopathy with chronic combined CHF. - followed by Dr. Ida Rogue with [facility]  Pituitary tumor. - followed by Dr. Deetta Perla with neurosurgery  Time Spent Involved in Patient Ward on Day of Examination:  33 minutes  Follow up:   Patient Instructions  Stop using anoro Lorraine flovent.  Use trelegy one puff daily Lorraine rinse your mouth after each use.  Albuterol two puffs every 6 hours as needed for cough, wheeze, or chest congestion.  Will arrange for home sleep study.  Follow up in 4 to 5 months.  Medication List:   Allergies as of 03/14/2021       Reactions   Latex Rash      Shellfish Allergy Anaphylaxis   Hard shellfish/swelling in throat   Sulfonamide Derivatives Anaphylaxis   Swelling in throat.   Watermelon [citrullus Vulgaris] Other (See Comments)   Throat itchy   Soy Allergy    Diarrhea/upset stomach    Tomato Itching   Throat itchy - also Raw carrots   Other Swelling        Medication List        Accurate as of March 14, 2021 12:48 PM. If you have any questions, ask your nurse or doctor.          albuterol 108 (90 Base) MCG/ACT inhaler Commonly known as: VENTOLIN HFA Inhale 2 puffs into the lungs every 6 (six) hours as needed for wheezing or shortness of breath.   aspirin EC 81 MG tablet Take 1 tablet (81 mg total) by mouth daily.   Azelastine HCl 0.15 % Soln Place 2 sprays into both nostrils daily as needed (allergies).   cyanocobalamin 1000 MCG/ML injection Commonly known as: (VITAMIN B-12) Inject 1 mL (1,000 mcg total) into the muscle every 30 (thirty) days.   cyclobenzaprine 5 MG tablet Commonly known as: FLEXERIL Take 1-2 tablets (5-10 mg total) by mouth daily as needed for muscle spasms.   dicyclomine 10 MG capsule Commonly known as: Bentyl Take 1 capsule  (10 mg total) by mouth 3 (three) times daily before meals. As needed for abdominal pain   esomeprazole 40 MG capsule Commonly known as: NEXIUM Take 1 capsule (40 mg total) by mouth daily.   ezetimibe 10 MG tablet Commonly known as: ZETIA Take 1 tablet (10 mg total) by mouth daily.   fluticasone 50 MCG/ACT nasal spray Commonly known as: FLONASE Place 2 sprays into both nostrils daily as needed for allergies. Max dose 2 sprays each nostril   furosemide 20 MG tablet Commonly known as: LASIX 1 tablet once qd Lorraine 2nd tablet if needed fluid build up prn   hydrOXYzine 25 MG tablet Commonly known as: ATARAX/VISTARIL TAKE 1 TO 2 TABLETS BY MOUTH ONCE DAILY AS NEEDED   ipratropium-albuterol 0.5-2.5 (3) MG/3ML Soln Commonly known as: DUONEB Take 3 mLs by nebulization 2 (two) times daily as needed (wheezing/shortness of breath).   isosorbide mononitrate 30 MG 24 hr tablet Commonly known as: IMDUR Take 1 tablet (  30 mg total) by mouth 2 (two) times daily.   levocetirizine 5 MG tablet Commonly known as: Xyzal Take 1 tablet (5 mg total) by mouth at bedtime as needed for allergies.   linaclotide 72 MCG capsule Commonly known as: Linzess TAKE 1 CAPSULE BY MOUTH ONCE DAILY BEFORE BREAKFAST   losartan 25 MG tablet Commonly known as: COZAAR Take 0.5 tablets (12.5 mg total) by mouth daily.   meclizine 25 MG tablet Commonly known as: ANTIVERT TAKE 1 TABLET BY MOUTH TWICE DAILY AS NEEDED FOR DIZZINESS   metFORMIN 500 MG 24 hr tablet Commonly known as: Glucophage XR Take 1 tablet (500 mg total) by mouth daily with breakfast.   metoprolol succinate 50 MG 24 hr tablet Commonly known as: TOPROL-XL TAKE 1 TABLET BY MOUTH ONCE DAILY WITH OR IMMEDIATELY FOLLOWING A MEAL   mirabegron ER 25 MG Tb24 tablet Commonly known as: Myrbetriq Take 1 tablet (25 mg total) by mouth daily.   montelukast 10 MG tablet Commonly known as: SINGULAIR Take 1 tablet (10 mg total) by mouth at bedtime.    mupirocin ointment 2 % Commonly known as: BACTROBAN Apply 1 application topically 2 (two) times daily. Both nostrils (nose) x 5 days   NEEDLE (DISP) 25 G 25G X 1-1/2" Misc 1 Device by Does not apply route every 30 (thirty) days.   nitroGLYCERIN 0.4 MG SL tablet Commonly known as: NITROSTAT Place 1 tablet (0.4 mg total) under the tongue every 5 (five) minutes as needed.   olopatadine 0.1 % ophthalmic solution Commonly known as: PATANOL Place 1 drop into both eyes 2 (two) times daily.   ondansetron 4 MG tablet Commonly known as: Zofran Take 1 tablet (4 mg total) by mouth every 8 (eight) hours as needed for nausea or vomiting.   OneTouch Delica Plus 0000000 Misc 1 Device by Does not apply route in the morning Lorraine at bedtime.   OneTouch Verio test strip Generic drug: glucose blood USE AS DIRECTED   PARoxetine 20 MG tablet Commonly known as: PAXIL Take 1 tablet (20 mg total) by mouth daily.   potassium chloride 10 MEQ tablet Commonly known as: KLOR-CON Take 1 tablet (10 mEq total) by mouth daily.   pregabalin 75 MG capsule Commonly known as: LYRICA Take 1 capsule (75 mg total) by mouth 2 (two) times daily.   Repatha SureClick [id number] MG/ML Soaj Generic drug: Evolocumab INJECT 1 PEN INTO THE SKIN EVERY 14 DAYS   rosuvastatin 20 MG tablet Commonly known as: CRESTOR Take 1 tablet (20 mg total) by mouth at bedtime.   sodium chloride 0.65 % Soln nasal spray Commonly known as: OCEAN Place 2 sprays into both nostrils daily as needed for congestion.   traZODone 50 MG tablet Commonly known as: DESYREL Take 0.5-1 tablets (25-50 mg total) by mouth at bedtime as needed for sleep.   Trelegy Ellipta 100-62.5-25 MCG/INH Aepb Generic drug: Fluticasone-Umeclidin-Vilant INHALE 1 PUFF ONCE DAILY   Xarelto 2.5 MG Tabs tablet Generic drug: rivaroxaban Take 1 tablet by mouth twice daily        Signature:  Chesley Mires, MD [place] Pager - [phone] 03/14/2021, 12:48 PM

## 2021-03-14 NOTE — Patient Instructions (Signed)
Stop using anoro and flovent.  Use trelegy one puff daily and rinse your mouth after each use.  Albuterol two puffs every 6 hours as needed for cough, wheeze, or chest congestion.  Will arrange for home sleep study.  Follow up in 4 to 5 months.

## 2021-03-18 ENCOUNTER — Ambulatory Visit: Payer: Medicare HMO | Admitting: Cardiovascular Disease

## 2021-03-18 DIAGNOSIS — M542 Cervicalgia: Secondary | ICD-10-CM | POA: Diagnosis not present

## 2021-03-19 NOTE — Telephone Encounter (Signed)
Called and spoke with [name]. [name] states that she is not currently taking her metformin and that she stopped it due to her A1c being normal. Pt states that while on the metformin, she has felt sick and weak and requests an alternative medication if she needs to get back on it. She is agreeable to seeing catie and wants the referral.

## 2021-03-20 NOTE — Telephone Encounter (Signed)
Referral placed.

## 2021-03-20 NOTE — Addendum Note (Signed)
Addended by: Leone Haven on: 03/20/2021 03:43 PM   Modules accepted: Orders

## 2021-03-21 ENCOUNTER — Telehealth: Payer: Self-pay

## 2021-03-21 DIAGNOSIS — M25511 Pain in right shoulder: Secondary | ICD-10-CM | POA: Diagnosis not present

## 2021-03-21 DIAGNOSIS — M6281 Muscle weakness (generalized): Secondary | ICD-10-CM | POA: Diagnosis not present

## 2021-03-21 DIAGNOSIS — M542 Cervicalgia: Secondary | ICD-10-CM | POA: Diagnosis not present

## 2021-03-21 NOTE — Chronic Care Management (AMB) (Signed)
  Chronic Care Management   Note  03/21/2021 Name: [name] MRN: [id number] DOB: 07/23/1961  Rebeka Kimble is a 60 y.o. year old female who is a primary care patient of [name], [name], MD. I reached out to [employer] by phone today in response to a referral sent by Ms. Eylin Trulson's PCP, [name], [name], MD      Ms. Balash was given information about Chronic Care Management services today including:  CCM service includes personalized support from designated clinical staff supervised by her physician, including individualized plan of care and coordination with other care providers 24/7 contact phone numbers for assistance for urgent and routine care needs. Service will only be billed when office clinical staff spend 20 minutes or more in a month to coordinate care. Only one practitioner may furnish and bill the service in a calendar month. The patient may stop CCM services at any time (effective at the end of the month) by phone call to the office staff. The patient will be responsible for cost sharing (co-pay) of up to 20% of the service fee (after annual deductible is met).  Patient agreed to services and verbal consent obtained.   Follow up plan: Telephone appointment with care management team member scheduled for:03/28/2021  Noreene Larsson, [place], [place], [place] [address] Direct Dial: [phone] [email or web address]

## 2021-03-28 ENCOUNTER — Ambulatory Visit (INDEPENDENT_AMBULATORY_CARE_PROVIDER_SITE_OTHER): Payer: Medicare HMO | Admitting: Pharmacist

## 2021-03-28 DIAGNOSIS — E119 Type 2 diabetes mellitus without complications: Secondary | ICD-10-CM

## 2021-03-28 DIAGNOSIS — F32A Depression, unspecified: Secondary | ICD-10-CM

## 2021-03-28 DIAGNOSIS — R69 Illness, unspecified: Secondary | ICD-10-CM | POA: Diagnosis not present

## 2021-03-28 DIAGNOSIS — F419 Anxiety disorder, unspecified: Secondary | ICD-10-CM

## 2021-03-28 DIAGNOSIS — Z951 Presence of aortocoronary bypass graft: Secondary | ICD-10-CM

## 2021-03-28 DIAGNOSIS — L298 Other pruritus: Secondary | ICD-10-CM

## 2021-03-28 DIAGNOSIS — K582 Mixed irritable bowel syndrome: Secondary | ICD-10-CM

## 2021-03-28 DIAGNOSIS — J449 Chronic obstructive pulmonary disease, unspecified: Secondary | ICD-10-CM | POA: Diagnosis not present

## 2021-03-28 DIAGNOSIS — I5022 Chronic systolic (congestive) heart failure: Secondary | ICD-10-CM

## 2021-03-28 DIAGNOSIS — J309 Allergic rhinitis, unspecified: Secondary | ICD-10-CM

## 2021-03-28 DIAGNOSIS — I2510 Atherosclerotic heart disease of native coronary artery without angina pectoris: Secondary | ICD-10-CM | POA: Diagnosis not present

## 2021-03-28 DIAGNOSIS — E782 Mixed hyperlipidemia: Secondary | ICD-10-CM | POA: Diagnosis not present

## 2021-03-28 MED ORDER — DAPAGLIFLOZIN PROPANEDIOL 5 MG PO TABS: 5.0000 mg | ORAL_TABLET | Freq: Every day | ORAL | 1 refills | Status: DC

## 2021-03-28 NOTE — Patient Instructions (Signed)
[name],   It was great talking with you today!  Start Farxiga 5 mg daily. Take this medication in the morning, as it can cause more frequent urination with more sugar in the urine. It may worsen risk for dehydration or genital infections. Focus on staying well hydrated and using good genital hygiene. Stop the medication and call our office if you develop any symptoms of genital infections, such as burning, itching, or pain while urinating or itching with redness that could be a yeast infection. If you have a day that you are vomiting or having diarrhea and you are very dehydrated, please hold this medication until you feel better.   Check your blood sugars periodically, alternating between:  1) Fasting, first thing in the morning before breakfast and  2) 2 hours after your largest meal.   For a goal A1c of less than 7%, goal fasting readings are less than 130 and goal 2 hour after meal readings are less than 180.   Call me with any questions or concerns!  [name], PharmD [phone]    Visit Information   PATIENT GOALS:   Goals Addressed               This Visit's Progress     Patient Stated     Medication Monitoring (pt-stated)        Patient Goals/Self-Care Activities Over the next 90 days, patient will:  - take medications as prescribed check glucose periodically, document, and provide at future appointments check blood pressure periodically, document, and provide at future appointments weigh daily, and contact provider if weight gain of >3 lbs in 1 day or >5 lbs per week        Consent to CCM Services: [name] was given information about Chronic Care Management services including:  CCM service includes personalized support from designated clinical staff supervised by her physician, including individualized plan of care and coordination with other care providers 24/7 contact phone numbers for assistance for urgent and routine care needs. Service will  only be billed when office clinical staff spend 20 minutes or more in a month to coordinate care. Only one practitioner may furnish and bill the service in a calendar month. The patient may stop CCM services at any time (effective at the end of the month) by phone call to the office staff. The patient will be responsible for cost sharing (co-pay) of up to 20% of the service fee (after annual deductible is met).  Patient agreed to services and verbal consent obtained.   The patient verbalized understanding of instructions, educational materials, and care plan provided today and agreed to receive a mailed copy of patient instructions, educational materials, and care plan.   Plan: Telephone follow up appointment with care management team member scheduled for:  6 weeks  [name], PharmD, [name], CPP Clinical Pharmacist [facility]: Patient Care Plan: Medication Management     Problem Identified: Diabetes, ASCVD, HF, COPD      Long-Range Goal: Disease Progression Prevention   Start Date: 03/28/2021  This Visit's Progress: On track  Priority: High  Note:   Current Barriers:  Does not adhere to prescribed medication regimen for metformin  Pharmacist Clinical Goal(s):  Over the next 90 days, patient will achieve adherence to monitoring guidelines and medication adherence to achieve therapeutic efficacy through collaboration with PharmD and provider.    Interventions: 1:1 collaboration with [name], [name], MD regarding development and update of  comprehensive plan of care as evidenced by provider attestation and co-signature Inter-disciplinary care team collaboration (see longitudinal plan of care) Comprehensive medication review performed; medication list updated in electronic medical record  Diabetes: Controlled per A1c, but with opportunities for optimization; current treatment: metformin XR 500 mg QAM; -  flagged on Adherence report for DM management. Reports today that metformin makes her feel shaky, funny. Has not been taking consistently.  Current glucose readings: not checking  Counseled on SGLT2, including mechanism of action, side effects, and benefits, especially in light of DM, CV history, HF, CKD. Discussed potential side effects of dehydration, genitourinary infections. Encouraged adequate hydration and genital hygiene. Advised on sick day rules (if a day with significantly reduced oral intake, serious vomiting, or diarrhea, hold SGLT2). Patient verbalized understanding. Start Farxiga 5 mg daily. Stop metformin XR 500 mg daily. Called [facility] to ask that they cancel refills on metformin prescription. Reviewed goal A1c, goal fasting, goal 2 hour post prandial glucose. Encouraged occasional home checks to evaluate impact of adding [place]. She verbalized understanding.   Hyperlipidemia, PAD, PVD, CAD, secondary ASCVD prevention: Controlled; current treatment: rosuvastatin 20 mg daily, ezetimibe 10 mg daily, Repatha 140 mg daily; follows w/ Dr. [name] Antiplatelet/coagulant regimen: Xarelto 2.5 mg BID, aspirin 81 mg daily Antianginal therapy: isosorbide mononitrate 30 mg BID; no recent use of nitroglycerin Requests that Repatha be re-sent as a 3 month supply. Will collaborate w/ Dr. [name] on this.   Recommended to continue current regimen at this time.   Heart Failure: Appropriately managed; current treatment:  ARNI/ACEi/ARB: losartan 12.5 mg daily Beta blocker: metoprolol succinate 50 mg daily SGLT2: starting today Mineralocorticoid Receptor Antagonist: none, no documented history Diuretic: furosemide 20 mg QAM, 20 mg QPM if needed; potassium 10 mEq daily Most recent ECHO: 03/2017 with EF 40-45% Current home vitals: not checking. Encouraged to do so Discussed diuretic impact of SGLT2. Encouraged home vital monitoring and to contact us or cardiology if concerns with dizziness,  lightheadedness Recommended to continue current regimen at this time along with collaboration with cardiology.   Chronic Obstructive Pulmonary Disease, hx tobacco use, allergies: Uncontrolled; current treatment: Trelegy 100/62.5/25 mcg daily, though reports that she is still struggling to figure out when is best to take it. Worries that if she forgets to take at the set time, she cannot take; albuterol HFA PRN, Duonebs PRN; levocetirizine 5 mg daily, montelukast 10 mg daily, fluticasone intranasal BID, azelastine nasal PRN, saline nasal spray PRN, olopatadine 0.1% eye drops BID Reports worsening allergies recently.  Most recent Pulmonary Function Testing: PFT 05/16/16: FEV1 88%, FEV1% 79 Discussed that Trelegy doses do not need to be taken exactly 24 hours apart. Reviewed that if she forgets a dose and timing is closer to missed dose than next dose, she can go ahead and take the dose. She verbalized understanding. Plans to take ~ 7 am with other pre-breakfast medications moving forward.  Requests that olopatadine eye drops and fluticasone nasal spray be sent for a 90 day supply rather than 30. Will discuss with covering provider Dr. Caryl Bis.  Recommended to continue current regimen at this time along with collaboration with pulmonary.   Depression, Anxiety, Pain: Moderately well controlled per patient report; current regimen: paroxetine 20 mg daily, pregabalin 75 mg BID, hydroxyzine 25 mg BID PRN; cyclobenzaprine 5 mg TID PRN, trazodone 25-50 mg QPM PRN Requests that pregabalin be sent for 90 day supply. Will collaborate w/ PCP on this on her return due to controlled substance status. Recommended to continue  current regimen at this time  GERD, IBS: Controlled per patient report; current regimen: esomeprazole 40 mg QAM, dicyclomine 10 mg TID PRN, ondansetron 4 mg PRN, Linzess 75 mcg QOD Recommended to continue current regimen at this time. Chose to avoid addition of GLP1 due to baseline GI  disease  Supplements: Vitamin B12 injection once monthly - reports that her daughter or her pastor gives this to her, and they haven't by to give it in a while. Encouraged to communicate with her support system to see if someone can give this to her and teach her to use.    Patient Goals/Self-Care Activities Over the next 90 days, patient will:  - take medications as prescribed check glucose periodically, document, and provide at future appointments check blood pressure periodically, document, and provide at future appointments weigh daily, and contact provider if weight gain of >3 lbs in 1 day or >5 lbs per week  Follow Up Plan: Telephone follow up appointment with care management team member scheduled for: ~ 6 weeks

## 2021-03-28 NOTE — Chronic Care Management (AMB) (Signed)
Chronic Care Management Pharmacy Note  03/28/2021 Name:  [name] MRN:  [id number] DOB:  13-Mar-1961  Subjective: [name] is an 60 y.o. year old female who is a primary patient of [name], [name], MD. Collaborating with covering provider [name] while PCP is on leaveThe CCM team was consulted for assistance with disease management and care coordination needs.    Engaged with patient by telephone for follow up visit in response to provider referral for pharmacy case management and/or care coordination services.   Consent to Services:  The patient was given the following information about Chronic Care Management services today, agreed to services, and gave verbal consent: 1. CCM service includes personalized support from designated clinical staff supervised by the primary care provider, including individualized plan of care and coordination with other care providers 2. 24/7 contact phone numbers for assistance for urgent and routine care needs. 3. Service will only be billed when office clinical staff spend 20 minutes or more in a month to coordinate care. 4. Only one practitioner may furnish and bill the service in a calendar month. 5.The patient may stop CCM services at any time (effective at the end of the month) by phone call to the office staff. 6. The patient will be responsible for cost sharing (co-pay) of up to 20% of the service fee (after annual deductible is met). Patient agreed to services and consent obtained.  Patient Care Team: [name], [name], MD as PCP - General (Internal Medicine) Minna Merritts, MD as PCP - Cardiology (Cardiology) De Hollingshead, RPH-CPP (Pharmacist)  Objective:  Lab Results  Component Value Date   CREATININE 1.03 01/10/2021   CREATININE 1.10 (H) 10/04/2020   CREATININE 1.01 (H) 08/21/2020    Lab Results  Component Value Date   HGBA1C 6.7 (H) 01/10/2021   Last diabetic Eye exam:  Lab Results   Component Value Date/Time   HMDIABEYEEXA No Retinopathy 01/17/2021 12:00 AM    Last diabetic Foot exam: No results found for: HMDIABFOOTEX      Component Value Date/Time   CHOL 133 01/10/2021 1050   CHOL 143 09/16/2018 1204   TRIG 130.0 01/10/2021 1050   HDL 51.40 01/10/2021 1050   HDL 44 09/16/2018 1204   CHOLHDL 3 01/10/2021 1050   VLDL 26.0 01/10/2021 1050   LDLCALC 56 01/10/2021 1050   LDLCALC 79 09/16/2018 1204    Hepatic Function Latest Ref Rng & Units 01/10/2021 07/24/2020 08/25/2019  Total Protein 6.0 - 8.3 g/dL 7.1 6.1 8.3(H)  Albumin 3.5 - 5.2 g/dL 4.5 3.7 4.6  AST 0 - 37 U/L _0 ALT 0 - 35 U/L _1 Alk Phosphatase 39 - 117 U/L 96 83 71  Total Bilirubin 0.2 - 1.2 mg/dL 0.3 0.3 0.6  Bilirubin, Direct 0.00 - 0.40 mg/dL - - -    Lab Results  Component Value Date/Time   TSH 0.73 07/24/2020 11:53 AM   TSH 1.03 02/07/2019 10:56 AM    CBC Latest Ref Rng & Units 02/27/2021 01/10/2021 08/21/2020  WBC 4.0 - 10.5 K/uL 7.4 6.1 5.2  Hemoglobin 12.0 - 15.0 g/dL 14.1 14.4 14.3  Hematocrit 36.0 - 46.0 % 42.1 43.4 42.1  Platelets 150.0 - 400.0 K/uL 151.0 168.0 152    Lab Results  Component Value Date/Time   VD25OH 21.23 (L) 01/10/2021 10:50 AM   VD25OH 22.21 (L) 07/24/2020 11:53 AM    Clinical ASCVD: Yes  The 10-year ASCVD risk score [name] DC Lorraine Bonito., et  al., 2013) is: 11.1%   Values used to calculate the score:     Age: 41 years     Sex: Female     Is Non-Hispanic African American: Yes     Diabetic: Yes     Tobacco smoker: No     Systolic Blood Pressure: 800 mmHg     Is BP treated: Yes     HDL Cholesterol: 51.4 mg/dL     Total Cholesterol: 133 mg/dL      Social History   Tobacco Use  Smoking Status Former   Packs/day: 1.00   Years: 38.00   Pack years: 38.00   Types: Cigarettes   Quit date: 08/12/2010   Years since quitting: 10.6  Smokeless Tobacco Never   BP Readings from Last 3 Encounters:  03/14/21 122/72  02/12/21 126/82  01/10/21 112/82    Pulse Readings from Last 3 Encounters:  03/14/21 74  02/12/21 63  01/10/21 73   Wt Readings from Last 3 Encounters:  03/14/21 220 lb 6.4 oz (100 kg)  02/12/21 224 lb 2 oz (101.7 kg)  01/10/21 225 lb (102.1 kg)    Assessment: Review of patient past medical history, allergies, medications, health status, including review of consultants reports, laboratory and other test data, was performed as part of comprehensive evaluation and provision of chronic care management services.   SDOH:  (Social Determinants of Health) assessments and interventions performed:  SDOH Interventions    Flowsheet Row Most Recent Value  SDOH Interventions   Financial Strain Interventions Intervention Not Indicated       CCM Care Plan  Allergies  Allergen Reactions   Latex Rash        Shellfish Allergy Anaphylaxis    Hard shellfish/swelling in throat   Sulfonamide Derivatives Anaphylaxis    Swelling in throat.   Watermelon [Citrullus Vulgaris] Other (See Comments)    Throat itchy   Soy Allergy     Diarrhea/upset stomach     Tomato Itching    Throat itchy - also Raw carrots   Other Swelling    Medications Reviewed Today     Reviewed by De Hollingshead, RPH-CPP (Pharmacist) on 03/28/21 at 1418  Med List Status: <None>   Medication Order Taking? Sig Documenting Provider Last Dose Status Informant  albuterol (VENTOLIN HFA) 108 (90 Base) MCG/ACT inhaler [id number] Yes Inhale 2 puffs into the lungs every 6 (six) hours as needed for wheezing or shortness of breath. Chesley Mires, MD Taking Active   aspirin EC 81 MG tablet [id number] Yes Take 1 tablet (81 mg total) by mouth daily. [name], [name], MD Taking Active   Azelastine HCl 0.15 % SOLN [id number] Yes Place 2 sprays into both nostrils daily as needed (allergies). [name], [name], MD Taking Active   cyanocobalamin (,VITAMIN B-12,) 1000 MCG/ML injection [id number] No Inject 1 mL (1,000 mcg total) into the muscle every 30  (thirty) days.  Patient not taking: Reported on 03/28/2021   [name], [name], MD Not Taking Active   cyclobenzaprine (FLEXERIL) 5 MG tablet [id number] Yes Take 1-2 tablets (5-10 mg total) by mouth daily as needed for muscle spasms. [name], [name], MD Taking Active   dicyclomine (BENTYL) 10 MG capsule [id number] Yes Take 1 capsule (10 mg total) by mouth 3 (three) times daily before meals. As needed for abdominal pain [name], [name], MD Taking Active   esomeprazole (NEXIUM) 40 MG capsule [id number] Yes Take 1 capsule (40 mg total) by mouth daily. [name], [name], MD Taking Active  Evolocumab (REPATHA SURECLICK) 242 MG/ML SOAJ [id number] Yes INJECT 1 PEN INTO THE SKIN EVERY 14 DAYS [name], Kathlene November, MD Taking Active   ezetimibe (ZETIA) 10 MG tablet [id number] Yes Take 1 tablet (10 mg total) by mouth daily. Minna Merritts, MD Taking Active   fluticasone Asencion Islam) 50 MCG/ACT nasal spray [id number] Yes Place 2 sprays into both nostrils daily as needed for allergies. Max dose 2 sprays each nostril [name], [name], MD Taking Active   Fluticasone-Umeclidin-Vilant (TRELEGY ELLIPTA) 100-62.5-25 MCG/INH AEPB [id number] Yes INHALE 1 PUFF ONCE DAILY Chesley Mires, MD Taking Active   furosemide (LASIX) 20 MG tablet [id number] Yes 1 tablet once qd and 2nd tablet if needed fluid build up prn Minna Merritts, MD Taking Active   hydrOXYzine (ATARAX/VISTARIL) 25 MG tablet [id number] No TAKE 1 TO 2 TABLETS BY MOUTH ONCE DAILY AS NEEDED  Patient not taking: Reported on 03/28/2021   [name], [name], MD Not Taking Active   ipratropium-albuterol (DUONEB) 0.5-2.5 (3) MG/3ML SOLN [id number] No Take 3 mLs by nebulization 2 (two) times daily as needed (wheezing/shortness of breath).  Patient not taking: Reported on 03/28/2021   [name], [name], MD Not Taking Active   isosorbide mononitrate (IMDUR) 30 MG 24 hr tablet [id number] Yes Take 1 tablet (30 mg total) by mouth 2 (two)  times daily. Minna Merritts, MD Taking Active   Lancets (ONETOUCH DELICA PLUS MHDQQI29N) [place] [id number] Yes 1 Device by Does not apply route in the morning and at bedtime. [name], [name], MD Taking Active   levocetirizine (XYZAL) 5 MG tablet [id number] Yes Take 1 tablet (5 mg total) by mouth at bedtime as needed for allergies. [name], [name], MD Taking Active   linaclotide Rolan Lipa) 72 MCG capsule [id number] Yes TAKE 1 CAPSULE BY MOUTH ONCE DAILY BEFORE BREAKFAST [name], [name], MD Taking Active            Med Note Nat Christen Mar 28, 2021  2:13 PM) Using every other day  losartan (COZAAR) 25 MG tablet [id number] Yes Take 0.5 tablets (12.5 mg total) by mouth daily. Minna Merritts, MD Taking Active   meclizine (ANTIVERT) 25 MG tablet [id number] Yes TAKE 1 TABLET BY MOUTH TWICE DAILY AS NEEDED FOR DIZZINESS [name], [name], MD Taking Active   metFORMIN (GLUCOPHAGE XR) 500 MG 24 hr tablet [id number] No Take 1 tablet (500 mg total) by mouth daily with breakfast.  Patient not taking: Reported on 03/28/2021   [name], [name], MD Not Taking Active   metoprolol succinate (TOPROL-XL) 50 MG 24 hr tablet [id number] Yes TAKE 1 TABLET BY MOUTH ONCE DAILY WITH OR IMMEDIATELY FOLLOWING A MEAL [name], Kathlene November, MD Taking Active   mirabegron ER (MYRBETRIQ) 25 MG TB24 tablet [id number] Yes Take 1 tablet (25 mg total) by mouth daily. [name], [name], MD Taking Active   montelukast (SINGULAIR) 10 MG tablet [id number] Yes Take 1 tablet (10 mg total) by mouth at bedtime. [name], [name], MD Taking Active   mupirocin ointment (BACTROBAN) 2 % [id number] No Apply 1 application topically 2 (two) times daily. Both nostrils (nose) x 5 days  Patient not taking: Reported on 03/28/2021   [name], [name], MD Not Taking Active   NEEDLE, DISP, 25 G 25G X 1-1/2" MISC [id number] Yes 1 Device by Does not apply route every 30 (thirty) days.  [name], [name], MD Taking Active   nitroGLYCERIN (NITROSTAT) 0.4 MG SL tablet [id number] No Place 1 tablet (0.4 mg  total) under the tongue every 5 (five) minutes as needed.  Patient not taking: Reported on 03/28/2021   Minna Merritts, MD Not Taking Active   olopatadine (PATANOL) 0.1 % ophthalmic solution [id number] Yes Place 1 drop into both eyes 2 (two) times daily. [name], [name], MD Taking Active   ondansetron [facility]) 4 MG tablet [id number] Yes Take 1 tablet (4 mg total) by mouth every 8 (eight) hours as needed for nausea or vomiting. [name], [name], MD Taking Active   [facility] VERIO test strip [id number] Yes USE AS DIRECTED [name], [name], MD Taking Active   PARoxetine (PAXIL) 20 MG tablet [id number] Yes Take 1 tablet (20 mg total) by mouth daily. [name], [name], MD Taking Active   potassium chloride (KLOR-CON) 10 MEQ tablet [id number] Yes Take 1 tablet (10 mEq total) by mouth daily. Minna Merritts, MD Taking Active   pregabalin (LYRICA) 75 MG capsule [id number] Yes Take 1 capsule (75 mg total) by mouth 2 (two) times daily. [name], [name], MD Taking Active   rivaroxaban (XARELTO) 2.5 MG TABS tablet [id number] Yes Take 1 tablet by mouth twice daily [name], Kathlene November, MD Taking Active   rosuvastatin (CRESTOR) 20 MG tablet [id number] Yes Take 1 tablet (20 mg total) by mouth at bedtime. Minna Merritts, MD Taking Active   sodium chloride (OCEAN) 0.65 % SOLN nasal spray [id number] Yes Place 2 sprays into both nostrils daily as needed for congestion. [name], [name], MD Taking Active   traZODone (DESYREL) 50 MG tablet [id number] Yes Take 0.5-1 tablets (25-50 mg total) by mouth at bedtime as needed for sleep. [name], [name], MD Taking Active             Patient Active Problem List   Diagnosis Date Noted   Vitamin D deficiency 01/14/2021   Breast mass, right 01/14/2021   Annual physical exam 01/14/2021   Pruritus of both  eyes 09/21/2020   Lipoma of right lower extremity 08/30/2020   Hypertension associated with diabetes ([place]) 07/24/2020   Abnormality of both breasts on screening mammogram 05/14/2020   Bilateral hip pain 04/19/2020   Insomnia 04/19/2020   Chronic low back pain 02/22/2020   Internal hemorrhoids 02/22/2020   Benign breast cyst in female, right 02/22/2020   OSA on CPAP 12/15/2019   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[place]) 12/02/2019   Sensorimotor neuropathy 12/02/2019   Irritable bowel syndrome with both constipation and diarrhea 10/13/2019   Mild intermittent asthma 10/13/2019   PUD (peptic ulcer disease)    Status post total knee replacement using cement, right 04/14/2019   Overactive bladder 03/10/2019   Mild persistent asthma 11/29/2018   DDD (degenerative disc disease), cervical 11/26/2018   Chronic constipation 11/26/2018   Fatty liver 11/26/2018   Adrenal adenoma, left 11/26/2018   Allergic rhinitis 08/26/2018   Bunion of right foot 08/26/2018   Fall 05/14/2018   Anxiety and depression 04/05/2018   Cervical radiculopathy 02/24/2018   Numbness and tingling in right hand 02/24/2018   Chronic neck pain 12/18/2017   Type 2 diabetes mellitus without complication, without long-term current use of insulin ([place]) 12/18/2017   Urinary incontinence 12/18/2017   Lumbar radiculopathy 12/18/2017   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   Abnormal feces    Benign neoplasm of ascending colon    Intractable vomiting with nausea    Acute esophagogastric ulcer    Gastritis without bleeding    Vasomotor flushing 11/24/2016   Morbid obesity ([place]) 10/29/2015   Back ache 06/19/2015   Colon  polyp 06/19/2015   CCF (congestive cardiac failure) ([place]) 06/19/2015   Family history of colon cancer 06/19/2015   Orthostatic hypotension 04/30/2015   Hematochezia 01/11/2015   History of colonic polyps 01/11/2015   Atypical chest pain 01/20/2014   COPD with chronic bronchitis and emphysema ([place]) 01/03/2013   Right  knee pain 12/20/2011   HTN (hypertension) 29/92/4268   Systolic CHF, chronic (HCC)    History of cervical cancer    Depression    GERD (gastroesophageal reflux disease)    Seasonal allergies    History of drug abuse ([place])    PVD (peripheral vascular disease) ([place]) 02/03/2011   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   Smoking history 12/16/2010   Hyperlipidemia 08/23/2010   Coronary artery disease involving native coronary artery of native heart without angina pectoris 08/23/2010   CLAUDICATION 08/23/2010   CAROTID BRUIT 08/23/2010   SHORTNESS OF BREATH 08/23/2010    Immunization History  Administered Date(s) Administered   Influenza Split 05/22/2015, 04/21/2016   Influenza,inj,Quad PF,6+ Mos 04/05/2018, 03/29/2019, 04/19/2020   PFIZER(Purple Top)SARS-COV-2 Vaccination 01/03/2020, 01/24/2020   Tdap 05/05/2019    Conditions to be addressed/monitored: CHF, CAD, HTN, HLD, COPD, and DMII  Care Plan : Medication Management  Updates made by De Hollingshead, RPH-CPP since 03/28/2021 12:00 AM     Problem: Diabetes, ASCVD, HF, COPD      Long-Range Goal: Disease Progression Prevention   Start Date: 03/28/2021  This Visit's Progress: On track  Priority: High  Note:   Current Barriers:  Does not adhere to prescribed medication regimen for metformin  Pharmacist Clinical Goal(s):  Over the next 90 days, patient will achieve adherence to monitoring guidelines and medication adherence to achieve therapeutic efficacy through collaboration with PharmD and provider.    Interventions: 1:1 collaboration with [name], [name], MD regarding development and update of comprehensive plan of care as evidenced by provider attestation and co-signature Inter-disciplinary care team collaboration (see longitudinal plan of care) Comprehensive medication review performed; medication list updated in electronic medical record  Diabetes: Controlled per A1c, but with opportunities for optimization; current  treatment: metformin XR 500 mg QAM; - flagged on Adherence report for DM management. Reports today that metformin makes her feel shaky, funny. Has not been taking consistently.  Current glucose readings: not checking  Counseled on SGLT2, including mechanism of action, side effects, and benefits, especially in light of DM, CV history, HF, CKD. Discussed potential side effects of dehydration, genitourinary infections. Encouraged adequate hydration and genital hygiene. Advised on sick day rules (if a day with significantly reduced oral intake, serious vomiting, or diarrhea, hold SGLT2). Patient verbalized understanding. Start Farxiga 5 mg daily. Stop metformin XR 500 mg daily. Called [facility] to ask that they cancel refills on metformin prescription. Reviewed goal A1c, goal fasting, goal 2 hour post prandial glucose. Encouraged occasional home checks to evaluate impact of adding [place]. She verbalized understanding.   Hyperlipidemia, PAD, PVD, CAD, secondary ASCVD prevention: Controlled; current treatment: rosuvastatin 20 mg daily, ezetimibe 10 mg daily, Repatha 140 mg daily; follows w/ Dr. [name] Antiplatelet/coagulant regimen: Xarelto 2.5 mg BID, aspirin 81 mg daily Antianginal therapy: isosorbide mononitrate 30 mg BID; no recent use of nitroglycerin Requests that Repatha be re-sent as a 3 month supply. Will collaborate w/ Dr. [name] on this.   Recommended to continue current regimen at this time.   Heart Failure: Appropriately managed; current treatment:  ARNI/ACEi/ARB: losartan 12.5 mg daily Beta blocker: metoprolol succinate 50 mg daily SGLT2: starting today Mineralocorticoid Receptor Antagonist:  none, no documented history Diuretic: furosemide 20 mg QAM, 20 mg QPM if needed; potassium 10 mEq daily Most recent ECHO: 03/2017 with EF 40-45% Current home vitals: not checking. Encouraged to do so Discussed diuretic impact of SGLT2. Encouraged home vital monitoring and to contact us or cardiology if  concerns with dizziness, lightheadedness Recommended to continue current regimen at this time along with collaboration with cardiology.   Chronic Obstructive Pulmonary Disease, hx tobacco use, allergies: Uncontrolled; current treatment: Trelegy 100/62.5/25 mcg daily, though reports that she is still struggling to figure out when is best to take it. Worries that if she forgets to take at the set time, she cannot take; albuterol HFA PRN, Duonebs PRN; levocetirizine 5 mg daily, montelukast 10 mg daily, fluticasone intranasal BID, azelastine nasal PRN, saline nasal spray PRN, olopatadine 0.1% eye drops BID Reports worsening allergies recently.  Most recent Pulmonary Function Testing: PFT 05/16/16: FEV1 88%, FEV1% 79 Discussed that Trelegy doses do not need to be taken exactly 24 hours apart. Reviewed that if she forgets a dose and timing is closer to missed dose than next dose, she can go ahead and take the dose. She verbalized understanding. Plans to take ~ 7 am with other pre-breakfast medications moving forward.  Requests that olopatadine eye drops and fluticasone nasal spray be sent for a 90 day supply rather than 30. Will discuss with covering provider Dr. Caryl Bis.  Recommended to continue current regimen at this time along with collaboration with pulmonary.   Depression, Anxiety, Pain: Moderately well controlled per patient report; current regimen: paroxetine 20 mg daily, pregabalin 75 mg BID, hydroxyzine 25 mg BID PRN; cyclobenzaprine 5 mg TID PRN, trazodone 25-50 mg QPM PRN Requests that pregabalin be sent for 90 day supply. Will collaborate w/ PCP on this on her return due to controlled substance status. Recommended to continue current regimen at this time  GERD, IBS: Controlled per patient report; current regimen: esomeprazole 40 mg QAM, dicyclomine 10 mg TID PRN, ondansetron 4 mg PRN, Linzess 75 mcg QOD Recommended to continue current regimen at this time. Chose to avoid addition of GLP1  due to baseline GI disease  Supplements: Vitamin B12 injection once monthly - reports that her daughter or her pastor gives this to her, and they haven't by to give it in a while. Encouraged to communicate with her support system to see if someone can give this to her and teach her to use.    Patient Goals/Self-Care Activities Over the next 90 days, patient will:  - take medications as prescribed check glucose periodically, document, and provide at future appointments check blood pressure periodically, document, and provide at future appointments weigh daily, and contact provider if weight gain of >3 lbs in 1 day or >5 lbs per week  Follow Up Plan: Telephone follow up appointment with care management team member scheduled for: ~ 6 weeks      Medication Assistance: None required.  Patient affirms current coverage meets needs.  Patient's preferred pharmacy is:  [facility] [address] ([address]), [place] [address] [place]) [place] [address] Phone: [phone] Fax: [phone]  Follow Up:  Patient agrees to Care Plan and Follow-up.  Plan: Telephone follow up appointment with care management team member scheduled for:  6 weeks  Catie Darnelle Maffucci, PharmD, [place], [place] Clinical Pharmacist [employer] [facility] [employer] [phone]

## 2021-03-29 MED ORDER — OLOPATADINE HCL 0.1 % OP SOLN: 1.0000 [drp] | Freq: Two times a day (BID) | OPHTHALMIC | 3 refills | Status: DC

## 2021-03-29 MED ORDER — FLUTICASONE PROPIONATE 50 MCG/ACT NA SUSP: 2.0000 | Freq: Every day | NASAL | 3 refills | Status: DC | PRN

## 2021-03-29 NOTE — Addendum Note (Signed)
Addended by: Caryl Bis, Shikha Bibb G on: 03/29/2021 11:15 AM   Modules accepted: Orders

## 2021-04-02 DIAGNOSIS — M542 Cervicalgia: Secondary | ICD-10-CM | POA: Diagnosis not present

## 2021-04-02 DIAGNOSIS — M6281 Muscle weakness (generalized): Secondary | ICD-10-CM | POA: Diagnosis not present

## 2021-04-02 DIAGNOSIS — M25511 Pain in right shoulder: Secondary | ICD-10-CM | POA: Diagnosis not present

## 2021-04-09 DIAGNOSIS — M6281 Muscle weakness (generalized): Secondary | ICD-10-CM | POA: Diagnosis not present

## 2021-04-09 DIAGNOSIS — M25511 Pain in right shoulder: Secondary | ICD-10-CM | POA: Diagnosis not present

## 2021-04-09 DIAGNOSIS — M542 Cervicalgia: Secondary | ICD-10-CM | POA: Diagnosis not present

## 2021-04-10 ENCOUNTER — Telehealth: Payer: Self-pay | Admitting: Internal Medicine

## 2021-04-10 NOTE — Telephone Encounter (Signed)
Rejection Reason - Patient Declined - Patient called and canceled the appointment" [name] said on Apr 10, 2021 9:02 AM  Msg from [facility]

## 2021-04-16 ENCOUNTER — Encounter: Payer: Self-pay | Admitting: Neurology

## 2021-04-16 ENCOUNTER — Ambulatory Visit (INDEPENDENT_AMBULATORY_CARE_PROVIDER_SITE_OTHER): Payer: Medicare HMO | Admitting: Neurology

## 2021-04-16 ENCOUNTER — Telehealth: Payer: Self-pay | Admitting: Neurology

## 2021-04-16 VITALS — BP 133/88 | HR 71 | Ht 65.0 in | Wt 222.8 lb

## 2021-04-16 DIAGNOSIS — R5383 Other fatigue: Secondary | ICD-10-CM | POA: Diagnosis not present

## 2021-04-16 DIAGNOSIS — D497 Neoplasm of unspecified behavior of endocrine glands and other parts of nervous system: Secondary | ICD-10-CM | POA: Diagnosis not present

## 2021-04-16 DIAGNOSIS — N6452 Nipple discharge: Secondary | ICD-10-CM

## 2021-04-16 NOTE — Progress Notes (Signed)
[facility]    Provider:  Dr Jaynee Eagles Requesting Provider: [name], Olivia Mackie * Primary Care Provider:  [name], [name], MD  CC:  pituitary tumor  HPI:  [name] is a 60 y.o. 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   She has a follow up with ophthalmology, she follows with them  for her tumor but vision is stable, she does not remember which surgeon she saw last year, unsure she saw one neurosurgeon but hasn't followed up will refer to [place] neurosurgery for follow up. She has been having drainage from nipples for several years, discussed prolactin tumors, still coming out, will check some bloodwork and also have endocrinology take a look to see if this tumor is treatable. No vision changes. No significant headache, she has floaters in her yes but follow with ophthalmology, no headaches, no new symptoms, she has some sinus issues, she just wants to know why and what the plan is. No other focal neurologic deficits, associated symptoms, inciting events or modifiable factors.  Reviewed notes, labs and imaging from outside physicians, which showed:  01/2021:Pituitary mass reviewed mri images and agree with following:   lesion is present on the right, unchanged in size. The mass shows homogeneous enhancement and displaces the infundibulum to the left.There is no compression of the optic chiasm. No extension into the cavernous sinus. The pituitary mass measures approximately 15 x 13 x 12 mm, unchanged.Pituitary tumor on the right is unchanged in size compatible with macro adenoma. No compression optic chiasm and no cavernous sinus invasion.  Review of Systems: Patient complains of symptoms per HPI as well as the following symptoms  fatigue, nipple discharge. Pertinent negatives and positives per HPI. All others negative.   Social History   Socioeconomic History   Marital status: Legally Separated    Spouse name: Not on file   Number of children: Not on file   Years of education: Not on file   Highest education level: Not on file  Occupational History   Not on file  Tobacco Use   Smoking status: Former    Packs/day: 1.00    Years: 38.00    Pack years: 38.00    Types: Cigarettes    Quit date: 08/12/2010    Years since quitting: 10.6   Smokeless tobacco: Never  Vaping Use   Vaping Use: Never used  Substance and Sexual Activity   Alcohol use: No   Drug use: No    Types: Cocaine    Comment: Remote Hx (crack cocaine and marijuana).none since 41yrs plus   Sexual activity: Yes  Other Topics Concern   Not on file  Social History Narrative   Caffeine: 1 cup coffee/day   Lives with family, no pets   Occupation: industrial work, 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   Edu: 11th grade   Activity: no regular exercise   Diet: good water, vegetables daily, low salt diet   Lives with sister and other family    No guns, wears seat belts, safe in relationship    2 kids    GED 1 year of college    Social Determinants of Health   Financial Resource Strain: Low Risk    Difficulty of Paying Living Expenses: Not hard at all  Food Insecurity: No Food Insecurity   Worried About Charity fundraiser in the  Last Year: Never true   Ran Out of Food in the Last Year: Never true  Transportation Needs: No Transportation Needs   Lack of Transportation (Medical): No   Lack of Transportation (Non-Medical): No  Physical Activity: Unknown   Days of Exercise per Week: 0 days   Minutes of Exercise per Session: Not on file  Stress: No Stress Concern Present   Feeling of Stress : Not at all  Social Connections: Unknown   Frequency of Communication with Friends and Family: More than three times a week   Frequency of Social Gatherings  with Friends and Family: Once a week   Attends Religious Services: 1 to 4 times per year   Active Member of [employer] or Organizations: Yes   Attends Music therapist: More than 4 times per year   Marital Status: Not on file  Intimate Partner Violence: Not At Risk   Fear of Current or Ex-Partner: No   Emotionally Abused: No   Physically Abused: No   Sexually Abused: No    Family History  Problem Relation Age of Onset   Hypertension Father    Heart failure Father    Diabetes Father    Colon cancer Father 36   Glaucoma Father    Cancer Father        colorectal    Heart disease Father    Other Father        glaucoma   Breast cancer Mother 40       breast cancer, late 78's   Cancer Mother        breast   Brain cancer Sister    Arthritis Sister    Diabetes Sister    Hypertension Sister    Kidney disease Sister    Diabetes Brother    Hypertension Brother    Other Sister        brain tumor    Acute myelogenous leukemia Grandson        06/2019   Coronary artery disease Neg Hx    Stroke Neg Hx     Past Medical History:  Diagnosis Date   Allergy    Anemia    Arthritis    Asthma    Bacterial vaginitis    Blood in stool    Brachial neuritis or radiculitis NOS    Brain tumor (benign) ([place]) 07/2017   near optic nerve. being followed by neurosurgery/eye doctor and pcp. monitoring size.causes sinus problems   Bronchitis    Cardiac arrhythmia    Cervical neck pain with evidence of disc disease    C5/6 disease, MRI done late 2012 - no records available   Chronic combined systolic and diastolic CHF (congestive heart failure) ([place])    a. 08/2010 Echo: mildly reduced EF 40-45%, mild diffuse hypokinesis; b. 06/2016 Echo: EF 50%, no rwma, mild to mod TR; c. 03/2017 Echo: EF 40-45%, Gr1 DD (prior echo reviewed and EF felt to be lower than reported).   Chronic pain    Cocaine abuse, in remission (HCC)    clean x 24 years   Colon polyps    COPD (chronic obstructive  pulmonary disease) ([place])    a. 12/2018 PFT: No obvious obst/restrictive dzs.   Coronary artery disease    a. PCI of LCX 2003; b. PCI of the LAD 2012 with a (2.5 x 8 mm BMS);  c.s/p CABG 4/12: L-LAD, VG-Dx, VG-OM, VG-RCA (Dr. Prescott Gum);  d. 01/2012 MV: inf infarct, attenuation, no ischemia; e. 02/2017 MV: signifi attenuation  artifact. Fixed basal antlat/inflat scar vs artifact. Reversible apical lat and mid antlat defect - ? atten vs ischemia. F/u echo w/o wma->Med rx.   Depression    Diabetes mellitus without complication ([place])    Family history of colon cancer    Generalized headaches    frequent   GERD (gastroesophageal reflux disease)    Headache    Heart murmur    Hematochezia    Hepatitis    history of hepatitis b   History of cervical cancer    s/p cryotherapy   History of drug abuse ([place])    cocaine, marijuana, clean since 1989   History of hepatitis B    from eating undercooked liver   History of MI (myocardial infarction)    Hyperlipidemia    Hypertension    Ischemic cardiomyopathy    a. 03/2017 Echo: EF 40-45%, Gr1 DD.   Myocardial infarction ([place]) 2003, 2012   PAD (peripheral artery disease) ([place])    a. s/p Right SFA atherectomy and PTA 01/15/11; b. 07/2018 ABI: R 1.02, L 1.09.   Pituitary mass ([place])    a. 12/2018 MRI Brain: Stable pituitary mass w/ 42mm area of necrosis. Mass abuts R cavernous sinus w/o definite invasion.   Polyp of colon    Seasonal allergies    Sleep apnea    uses cpap   Smoking history    quit 07/2010   Thyroid disease    Urinary incontinence     Patient Active Problem List   Diagnosis Date Noted   Vitamin D deficiency 01/14/2021   Breast mass, right 01/14/2021   Annual physical exam 01/14/2021   Pruritus of both eyes 09/21/2020   Lipoma of right lower extremity 08/30/2020   Hypertension associated with diabetes ([place]) 07/24/2020   Abnormality of both breasts on screening mammogram 05/14/2020   Bilateral hip pain 04/19/2020   Insomnia  04/19/2020   Chronic low back pain 02/22/2020   Internal hemorrhoids 02/22/2020   Benign breast cyst in female, right 02/22/2020   OSA on CPAP 12/15/2019   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[place]) 12/02/2019   Sensorimotor neuropathy 12/02/2019   Irritable bowel syndrome with both constipation and diarrhea 10/13/2019   Mild intermittent asthma 10/13/2019   PUD (peptic ulcer disease)    Status post total knee replacement using cement, right 04/14/2019   Overactive bladder 03/10/2019   Mild persistent asthma 11/29/2018   DDD (degenerative disc disease), cervical 11/26/2018   Chronic constipation 11/26/2018   Fatty liver 11/26/2018   Adrenal adenoma, left 11/26/2018   Allergic rhinitis 08/26/2018   Bunion of right foot 08/26/2018   Fall 05/14/2018   Anxiety and depression 04/05/2018   Cervical radiculopathy 02/24/2018   Numbness and tingling in right hand 02/24/2018   Chronic neck pain 12/18/2017   Type 2 diabetes mellitus without complication, without long-term current use of insulin ([place]) 12/18/2017   Urinary incontinence 12/18/2017   Lumbar radiculopathy 12/18/2017   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   Abnormal feces    Benign neoplasm of ascending colon    Intractable vomiting with nausea    Acute esophagogastric ulcer    Gastritis without bleeding    Vasomotor flushing 11/24/2016   Morbid obesity ([place]) 10/29/2015   Back ache 06/19/2015   Colon polyp 06/19/2015   CCF (congestive cardiac failure) ([place]) 06/19/2015   Family history of colon cancer 06/19/2015   Orthostatic hypotension 04/30/2015   Hematochezia 01/11/2015   History of colonic polyps 01/11/2015   Atypical chest pain 01/20/2014   COPD  with chronic bronchitis and emphysema ([place]) 01/03/2013   Right knee pain 12/20/2011   HTN (hypertension) 67/67/2094   Systolic CHF, chronic (HCC)    History of cervical cancer    Depression    GERD (gastroesophageal reflux disease)    Seasonal allergies    History of drug abuse ([place])     PVD (peripheral vascular disease) ([place]) 02/03/2011   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   Smoking history 12/16/2010   Hyperlipidemia 08/23/2010   Coronary artery disease involving native coronary artery of native heart without angina pectoris 08/23/2010   CLAUDICATION 08/23/2010   CAROTID BRUIT 08/23/2010   SHORTNESS OF BREATH 08/23/2010    Past Surgical History:  Procedure Laterality Date   ABDOMINAL HYSTERECTOMY     2003   CHOLECYSTECTOMY  1986   COLONOSCOPY  2008   3 polyps   COLONOSCOPY WITH PROPOFOL N/A 02/06/2015   Procedure: COLONOSCOPY WITH PROPOFOL;  Surgeon: Lucilla Lame, MD;  Location: [facility] ENDOSCOPY;  Service: Endoscopy;  Laterality: N/A;   COLONOSCOPY WITH PROPOFOL N/A 01/27/2017   Procedure: COLONOSCOPY WITH PROPOFOL;  Surgeon: Lucilla Lame, MD;  Location[facility] ENDOSCOPY;  Service: Endoscopy;  Laterality: N/A;   CORONARY ANGIOPLASTY     w/ stent placement x2   CORONARY ARTERY BYPASS GRAFT  2012   Dr [name]   CORONARY STENT PLACEMENT  2003   S/P MI   CORONARY STENT PLACEMENT  2007   [place]   ESOPHAGOGASTRODUODENOSCOPY (EGD) WITH PROPOFOL N/A 02/06/2015   Procedure: ESOPHAGOGASTRODUODENOSCOPY (EGD) WITH PROPOFOL;  Surgeon: Lucilla Lame, MD;  Location: [facility] ENDOSCOPY;  Service: Endoscopy;  Laterality: N/A;   ESOPHAGOGASTRODUODENOSCOPY (EGD) WITH PROPOFOL N/A 01/27/2017   Procedure: ESOPHAGOGASTRODUODENOSCOPY (EGD) WITH PROPOFOL;  Surgeon: Lucilla Lame, MD;  Location: [facility] ENDOSCOPY;  Service: Endoscopy;  Laterality: N/A;   ESOPHAGOGASTRODUODENOSCOPY (EGD) WITH PROPOFOL N/A 09/01/2019   Procedure: ESOPHAGOGASTRODUODENOSCOPY (EGD) WITH PROPOFOL;  Surgeon: Lin Landsman, MD;  Location: [place];  Service: Gastroenterology;  Laterality: N/A;   FEMORAL ARTERY STENT  10/2010   right sided (Dr. Burt Knack)   KNEE ARTHROSCOPY WITH MEDIAL MENISECTOMY Right 08/20/2017   Procedure: KNEE ARTHROSCOPY WITH MEDIAL  AND LATERAL MENISECTOMY;  Surgeon: Hessie Knows, MD;  Location: [facility] ORS;   Service: Orthopedics;  Laterality: Right;   POSTERIOR CERVICAL LAMINECTOMY N/A 03/08/2018   Procedure: POSTERIOR CERVICAL LAMINECTOMY-C7;  Surgeon: Deetta Perla, MD;  Location: [facility] ORS;  Service: Neurosurgery;  Laterality: N/A;   TONSILLECTOMY     TOTAL KNEE ARTHROPLASTY Right 04/14/2019   Procedure: RIGHT TOTAL KNEE ARTHROPLASTY;  Surgeon: Hessie Knows, MD;  Location: [facility] ORS;  Service: Orthopedics;  Laterality: Right;   TUBAL LIGATION      Current Outpatient Medications  Medication Sig Dispense Refill   albuterol (VENTOLIN HFA) 108 (90 Base) MCG/ACT inhaler Inhale 2 puffs into the lungs every 6 (six) hours as needed for wheezing or shortness of breath. 1 g 12   aspirin EC 81 MG tablet Take 1 tablet (81 mg total) by mouth daily. 90 tablet 3   Azelastine HCl 0.15 % SOLN Place 2 sprays into both nostrils daily as needed (allergies). 30 mL 11   cyanocobalamin (,VITAMIN B-12,) 1000 MCG/ML injection Inject 1 mL (1,000 mcg total) into the muscle every 30 (thirty) days. 4 mL 3   cyclobenzaprine (FLEXERIL) 5 MG tablet Take 1-2 tablets (5-10 mg total) by mouth daily as needed for muscle spasms. 60 tablet 11   dapagliflozin propanediol (FARXIGA) 5 MG TABS tablet Take 1 tablet (5 mg total) by mouth  daily before breakfast. DISCONTINUE METFORMIN 90 tablet 1   dicyclomine (BENTYL) 10 MG capsule Take 1 capsule (10 mg total) by mouth 3 (three) times daily before meals. As needed for abdominal pain 120 capsule 11   esomeprazole (NEXIUM) 40 MG capsule Take 1 capsule (40 mg total) by mouth daily. 90 capsule 3   Evolocumab (REPATHA SURECLICK) 263 MG/ML SOAJ INJECT 1 PEN INTO THE SKIN EVERY 14 DAYS 6 mL 4   ezetimibe (ZETIA) 10 MG tablet Take 1 tablet (10 mg total) by mouth daily. 90 tablet 3   fluticasone (FLONASE) 50 MCG/ACT nasal spray Place 2 sprays into both nostrils daily as needed for allergies. Max dose 2 sprays each nostril 48 g 3   Fluticasone-Umeclidin-Vilant (TRELEGY ELLIPTA) 100-62.5-25 MCG/INH AEPB  INHALE 1 PUFF ONCE DAILY 60 each 11   furosemide (LASIX) 20 MG tablet 1 tablet once qd and 2nd tablet if needed fluid build up prn 180 tablet 3   hydrOXYzine (ATARAX/VISTARIL) 25 MG tablet TAKE 1 TO 2 TABLETS BY MOUTH ONCE DAILY AS NEEDED 60 tablet 11   ipratropium-albuterol (DUONEB) 0.5-2.5 (3) MG/3ML SOLN Take 3 mLs by nebulization 2 (two) times daily as needed (wheezing/shortness of breath). 360 mL 11   isosorbide mononitrate (IMDUR) 30 MG 24 hr tablet Take 1 tablet (30 mg total) by mouth 2 (two) times daily. 180 tablet 3   Lancets (ONETOUCH DELICA PLUS [id number]) MISC 1 Device by Does not apply route in the morning and at bedtime. 180 each 3   levocetirizine (XYZAL) 5 MG tablet Take 1 tablet (5 mg total) by mouth at bedtime as needed for allergies. 90 tablet 3   linaclotide (LINZESS) 72 MCG capsule TAKE 1 CAPSULE BY MOUTH ONCE DAILY BEFORE BREAKFAST 90 capsule 3   losartan (COZAAR) 25 MG tablet Take 0.5 tablets (12.5 mg total) by mouth daily. 45 tablet 3   meclizine (ANTIVERT) 25 MG tablet TAKE 1 TABLET BY MOUTH TWICE DAILY AS NEEDED FOR DIZZINESS 60 tablet 11   metoprolol succinate (TOPROL-XL) 50 MG 24 hr tablet TAKE 1 TABLET BY MOUTH ONCE DAILY WITH OR IMMEDIATELY FOLLOWING A MEAL 90 tablet 3   mirabegron ER (MYRBETRIQ) 25 MG TB24 tablet Take 1 tablet (25 mg total) by mouth daily. 90 tablet 3   montelukast (SINGULAIR) 10 MG tablet Take 1 tablet (10 mg total) by mouth at bedtime. 90 tablet 3   mupirocin ointment (BACTROBAN) 2 % Apply 1 application topically 2 (two) times daily. Both nostrils (nose) x 5 days 30 g 0   NEEDLE, DISP, 25 G 25G X 1-1/2" MISC 1 Device by Does not apply route every 30 (thirty) days. 4 each 3   nitroGLYCERIN (NITROSTAT) 0.4 MG SL tablet Place 1 tablet (0.4 mg total) under the tongue every 5 (five) minutes as needed. 25 tablet 4   olopatadine (PATANOL) 0.1 % ophthalmic solution Place 1 drop into both eyes 2 (two) times daily. 15 mL 3   ondansetron (ZOFRAN) 4 MG tablet  Take 1 tablet (4 mg total) by mouth every 8 (eight) hours as needed for nausea or vomiting. 20 tablet 5   ONETOUCH VERIO test strip USE AS DIRECTED 200 each 3   PARoxetine (PAXIL) 20 MG tablet Take 1 tablet (20 mg total) by mouth daily. 90 tablet 3   potassium chloride (KLOR-CON) 10 MEQ tablet Take 1 tablet (10 mEq total) by mouth daily. 90 tablet 3   pregabalin (LYRICA) 75 MG capsule Take 1 capsule (75 mg total) by mouth 2 (two)  times daily. 60 capsule 5   rivaroxaban (XARELTO) 2.5 MG TABS tablet Take 1 tablet by mouth twice daily 180 tablet 1   rosuvastatin (CRESTOR) 20 MG tablet Take 1 tablet (20 mg total) by mouth at bedtime. 90 tablet 3   sodium chloride (OCEAN) 0.65 % SOLN nasal spray Place 2 sprays into both nostrils daily as needed for congestion. 30 mL 11   traZODone (DESYREL) 50 MG tablet Take 0.5-1 tablets (25-50 mg total) by mouth at bedtime as needed for sleep. 90 tablet 3   No current facility-administered medications for this visit.    Allergies as of 04/16/2021 - Review Complete 04/16/2021  Allergen Reaction Noted   Latex Rash 08/23/2010   Shellfish allergy Anaphylaxis    Sulfonamide derivatives Anaphylaxis 08/23/2010   Watermelon [citrullus vulgaris] Other (See Comments) 04/13/2017   Soy allergy  03/10/2019   Tomato Itching 09/11/2020   Other Swelling 01/20/2012    Vitals: BP 133/88   Pulse 71   Ht 5\' 5"  (1.651 m)   Wt 222 lb 12.8 oz (101.1 kg)   BMI 37.08 kg/m  Last Weight:  Wt Readings from Last 1 Encounters:  04/16/21 222 lb 12.8 oz (101.1 kg)   Last Height:   Ht Readings from Last 1 Encounters:  04/16/21 5\' 5"  (1.651 m)     Physical exam: Exam: Gen: NAD, conversant, well nourised, obese, well groomed                     CV: RRR, no MRG. No Carotid Bruits. No peripheral edema, warm, nontender Eyes: Conjunctivae clear without exudates or hemorrhage  Neuro: Detailed Neurologic Exam  Speech:    Speech is normal; fluent and spontaneous with normal  comprehension.  Cognition:    The patient is oriented to person, place, and time;     recent and remote memory intact;     language fluent;     normal attention, concentration,     fund of knowledge Cranial Nerves:    The pupils are equal, round, and reactive to light. Difficulty due to small pupils but no papilledema appreciated Visual fields are full to finger confrontation. Extraocular movements are intact. Trigeminal sensation is intact and the muscles of mastication are normal. The face is symmetric. The palate elevates in the midline. Hearing intact. Voice is normal. Shoulder shrug is normal. The tongue has normal motion without fasciculations.   Coordination:    Normal   Gait:    normal.   Motor Observation:    No asymmetry, no atrophy, and no involuntary movements noted. Tone:    Normal muscle tone.    Posture:    Posture is normal. normal erect    Strength:    Strength is V/V in the upper and lower limbs.      Sensation: intact to LT     Reflex Exam:  DTR's:    Deep tendon reflexes in the upper and lower extremities are symmetrical  bilaterally.   Toes:    The toes are equiv bilaterally.   Clonus:    Clonus is absent.    Assessment/Plan:  She has a pituitary macroadenoma and drainage from her nipples, will test several labs including prolactin and send to endocrinology. Tumor is stable, the mass shows homogeneous enhancement and displaces the infundibulum to the left. There is no compression of the optic chiasm. No extension into the cavernous sinus. But needs f/u with neurosurgery as well.   - following with ophthalmology - has an  appointment - follow with neurosurgery, needs follow up with neurosurgery every year, will send to Dr. Zada Finders - but given the nipple discharge going to do some testing and send to endocrinology, prolactinoma? - Return in about 3 months (around 07/16/2021).   Orders Placed This Encounter  Procedures   Prolactin   Insulin-Like  Growth Factor   Luteinizing Hormone   Follicle Stimulating Hormone   Cortisol-am, blood   T4   T4, Free   TSH   Ambulatory referral to Neurosurgery   Ambulatory referral to Endocrinology   No orders of the defined types were placed in this encounter.   Cc: [name], Olivia Mackie *,  [name], [name], MD  Sarina Ill, MD  [facility] [address] [place], [place] [address]  Phone [phone] Fax [phone]

## 2021-04-16 NOTE — Telephone Encounter (Signed)
Referral sent to [facility]. Phone: [phone].

## 2021-04-16 NOTE — Patient Instructions (Addendum)
- following with ophthalmology - has an appointment - follow with neurosurgery, needs follow up with neurosurgery every year, will send to Dr. 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 The prolactin level test is often used to diagnose and monitor problems with the pituitary gland, such as pituitary tumors. It may also be used to help find the cause of certain other conditions, such as an abnormal absence of menstrual cycles (amenorrhea) or a thyroid gland that does not produce enough hormones (hypothyroidism). Your health care provider may order this test if you have: Irregular menstrual periods. Loss of libido. Milky fluid coming from your nipples (when not breastfeeding). Tiredness (fatigue). What is being tested? This test measures the amount of prolactin in your blood. Prolactin is a hormone that is produced by the pituitary gland. Prolactin levels normally go up and down, or fluctuate, due to stress, illness, trauma, or surgery. Increased levels can also be caused by tumors or other health problems. What kind of sample is taken? A blood sample is required for this test. It is usually collected by inserting a needle into a blood vessel. Tell a health care provider about: All medicines you are taking, including vitamins, herbs, eye drops, creams, and over-the-counter medicines. How are the results reported? Your test results will be reported as values that indicate the amount of prolactin in your blood. Your health care provider will compare your results to normal ranges that were established after testing a large group of people (reference ranges). Reference ranges may vary among labs and hospitals. For this test, common reference ranges are: Adult female: 3-13 ng/mL. Adult female: 3-27 ng/mL. Pregnant female: 20-400  ng/mL. What do the results mean? Increased levels of prolactin may mean that you have: A pituitary gland tumor. Amenorrhea. Hypothyroidism. Certain pituitary or reproductive syndromes. Kidney failure. Decreased levels of prolactin may indicate: Lack of blood to the pituitary gland. Pituitary gland failure. Talk with your health care provider about what your results mean. Questions to ask your health care provider Ask your health care provider, or the department that is doing the test: When will my results be ready? How will I get my results? What are my treatment options? What other tests do I need? What are my next steps? Summary The prolactin level test is often used to diagnose and monitor problems with the pituitary gland, such as pituitary tumors. It may also be used to help find the cause of certain other conditions, such as amenorrhea or hypothyroidism. This test measures the amount of prolactin in your blood. Prolactin is a hormone that is produced by the pituitary gland. Prolactin levels normally go up and down, or fluctuate, due to stress, illness, trauma, or surgery. Increased levels can also be caused by tumors or other health problems. Talk with your health care provider about what your results mean. This information is not intended to replace advice given to you by your health care provider. Make sure you discuss any questions you have with your health care provider. Document Revised: 06/19/2020 Document Reviewed: 06/19/2020 Elsevier Patient Education  2022 [employer] [place].   Pituitary Tumors Pituitary tumors are abnormal growths found in the pituitary gland. The pituitary gland is a pea-sized gland located in the base of the skull, behind the bridge of the nose. It makes hormones that affect growth and the  functions of other glands in the body. In most cases, pituitary tumors grow slowly, are not cancerous (are benign), and do not spread to other parts of the body. These  tumors are best treated when they are found and diagnosed early. A pituitary tumor may produce extra hormones (functioning tumor) or not (non-functioning tumor). A pituitary tumor may cause: Cushing disease. In this disease, the pituitary gland produces too much of a hormone called adrenocorticotropic hormone (ACTH). This hormone causes the adrenal glands to make high levels of another hormone called cortisol. This causes fat to build up in the face, back, and chest while the arms and legs become thin. Acromegaly. This is a condition in which the hands, feet, and face are larger than normal. Breast milk production, even when there is no pregnancy. What are the causes? The cause of most pituitary tumors is not known. In some cases, pituitary tumors may be passed from parent to child (inherited). What increases the risk? You are more likely to develop this condition if: You have a family history of pituitary tumors. You have certain syndromes caused by unwanted changes (mutations) in your genes. These include multiple endocrine neoplasia type 1 (MEN1) and 4 (MEN4), McCune-Albright syndrome, and Carney complex. What are the signs or symptoms? Symptoms depend on the size of the tumor and the specific hormones that a functioning tumor makes. Symptoms may include: Headaches. Seizures or loss of consciousness. Vision problems and eye muscle weakness. Weakness or low energy. Clear fluid draining from the nose. Changes in the sense of smell. Loss of body hair. Nausea and vomiting. Problems caused by the production of too many hormones, such as: Inability to get pregnant after a year of having sex regularly without using birth control (infertility). Loss of menstrual periods in women. Abnormal growth. Diabetes insipidus or diabetes mellitus. High blood pressure (hypertension). Inability to tolerate heat or cold. Increase in sweating. Joint pain. Other skin and body changes. Nipple  discharge. Changes in mood, or depression. Decreased sexual function. In some cases, there are no symptoms. How is this diagnosed? This condition may be diagnosed based on: A physical exam and your medical history. Blood or urine tests to check your hormone levels. Imaging tests such as a CT or MRI scan. Removal and examination of a small tumor tissue (biopsy). How is this treated? Treatment depends on the type of pituitary tumor you have and your overall health. Treatments may include: Surgical removal of the tumor. Using high doses of X-ray energy to kill tumor cells (radiation). Using certain medicines to stop the pituitary gland from producing too many hormones. Using certain medicines to kill abnormal or cancerous cells (chemotherapy) or to treat symptoms from the tumor. If you have a family history of pituitary tumors, you may need to have regular blood tests to monitor pituitary hormone levels. Follow these instructions at home: If directed, follow instructions from your health care provider about measuring how much urine you pass. Drink enough fluid to keep your urine pale yellow. If your nose is draining clear fluid, do not pick your nose or remove any crusting. Tell your health care provider if this condition worsens. Do not do any activities that require straining, such as heavy lifting. Ask your health care provider what activities are safe for you. Take over-the-counter and prescription medicines only as told by your health care provider. Keep all follow-up visits. This is important, especially if you have a family history of pituitary tumors and you need regular blood tests. Where  to find more information [place]: www.cancer.gov Contact a health care provider if: You have sudden, unusual thirst. You are urinating more often than usual. You have a headache that does not go away. You develop new changes in your vision. You have new or increased clear fluid  leaking from your nose or ears. You have a sensation of fluid trickling down the back of your throat. You have a salty taste in your mouth. You have trouble concentrating. Get help right away if: Your symptoms suddenly become severe. You have a nosebleed that does not stop after a few minutes. You have a fever of over 101F (38.3C). You have a severe headache. You have a stiff neck. You are confused or not as alert as usual. You have chest pain. You have shortness of breath. These symptoms may represent a serious problem that is an emergency. Do not wait to see if the symptoms will go away. Get medical help right away. Call your local emergency services (911 in the [place] Do not drive yourself to the hospital. Summary Pituitary tumors are abnormal growths found in the pituitary gland. Depending on the tumor type, this can cause your body to make excessive amounts of certain hormones. Symptoms of a pituitary tumor depend on the size of the tumor and the specific hormones that a functioning tumor makes. Some tumors may not cause symptoms. Treatment depends on the type of pituitary tumor you have and your overall health. Keep all follow-up visits. This is important, especially if you have a family history of pituitary tumors and you need regular blood tests. This information is not intended to replace advice given to you by your health care provider. Make sure you discuss any questions you have with your health care provider. Document Revised: 06/19/2020 Document Reviewed: 06/19/2020 Elsevier Patient Education  2022 [employer].

## 2021-04-17 ENCOUNTER — Telehealth: Payer: Self-pay | Admitting: Internal Medicine

## 2021-04-17 LAB — FOLLICLE STIMULATING HORMONE: FSH: 71.1 m[IU]/mL

## 2021-04-17 LAB — INSULIN-LIKE GROWTH FACTOR: Insulin-Like GF-1: 64 ng/mL (ref 57–202)

## 2021-04-17 LAB — CORTISOL-AM, BLOOD: Cortisol - AM: 9.6 ug/dL (ref 6.2–19.4)

## 2021-04-17 LAB — TSH: TSH: 1.52 u[IU]/mL (ref 0.450–4.500)

## 2021-04-17 LAB — T4: T4, Total: 6.8 ug/dL (ref 4.5–12.0)

## 2021-04-17 LAB — LUTEINIZING HORMONE: LH: 38.4 m[IU]/mL

## 2021-04-17 LAB — T4, FREE: Free T4: 0.82 ng/dL (ref 0.82–1.77)

## 2021-04-17 LAB — PROLACTIN: Prolactin: 20.6 ng/mL (ref 4.8–23.3)

## 2021-04-17 NOTE — Telephone Encounter (Signed)
Noted  

## 2021-04-17 NOTE — Telephone Encounter (Signed)
Patient informed, Due to the high volume of calls and your symptoms we have to forward your call to our Triage Nurse to expedient your call. Please hold for the transfer.  Patient transferred to [facility] at Access Nurse. Due to having vaginal itching and a rash for the past week that she thinks may be thrush or caused by a soap mixed with her feminine wash.No openings in office or virtual.

## 2021-04-17 NOTE — Telephone Encounter (Signed)
Patient called in stating that Access Nurse called but they are not allowed to give information to her sister for her,she is calling to speak with the nurse to get more information.Patient transferred to [facility] at Access Nurse who will assist the patient.

## 2021-04-17 NOTE — Telephone Encounter (Signed)
Called patien tshe is having what she thinks is thrush on her tongue , but also  having vaginal itching, No fever , chills or pain . Has white coating on tongue in spotted areas, and has Vaginal itching without odor scheduled her for Friday with MD.

## 2021-04-18 DIAGNOSIS — M542 Cervicalgia: Secondary | ICD-10-CM | POA: Diagnosis not present

## 2021-04-18 DIAGNOSIS — M25511 Pain in right shoulder: Secondary | ICD-10-CM | POA: Diagnosis not present

## 2021-04-18 DIAGNOSIS — M6281 Muscle weakness (generalized): Secondary | ICD-10-CM | POA: Diagnosis not present

## 2021-04-19 ENCOUNTER — Ambulatory Visit (INDEPENDENT_AMBULATORY_CARE_PROVIDER_SITE_OTHER): Payer: Medicare HMO | Admitting: Family Medicine

## 2021-04-19 ENCOUNTER — Other Ambulatory Visit: Payer: Self-pay

## 2021-04-19 ENCOUNTER — Other Ambulatory Visit (HOSPITAL_COMMUNITY)
Admission: RE | Admit: 2021-04-19 | Discharge: 2021-04-19 | Disposition: A | Discharge status: Home / Self Care | Payer: Medicare HMO | Source: Ambulatory Visit | Attending: Family Medicine | Admitting: Family Medicine

## 2021-04-19 VITALS — BP 115/80 | HR 72 | Temp 98.1°F | Ht 65.0 in | Wt 220.2 lb

## 2021-04-19 DIAGNOSIS — N898 Other specified noninflammatory disorders of vagina: Secondary | ICD-10-CM | POA: Diagnosis not present

## 2021-04-19 DIAGNOSIS — B370 Candidal stomatitis: Secondary | ICD-10-CM

## 2021-04-19 DIAGNOSIS — Z23 Encounter for immunization: Secondary | ICD-10-CM

## 2021-04-19 DIAGNOSIS — L989 Disorder of the skin and subcutaneous tissue, unspecified: Secondary | ICD-10-CM

## 2021-04-19 HISTORY — DX: Candidal stomatitis: B37.0

## 2021-04-19 HISTORY — DX: Disorder of the skin and subcutaneous tissue, unspecified: L98.9

## 2021-04-19 HISTORY — DX: Other specified noninflammatory disorders of vagina: N89.8

## 2021-04-19 MED ORDER — NYSTATIN 100000 UNIT/ML MT SUSP: 5.0000 mL | Freq: Four times a day (QID) | OROMUCOSAL | 0 refills | Status: DC

## 2021-04-19 MED ORDER — FLUCONAZOLE 150 MG PO TABS: 150.0000 mg | ORAL_TABLET | ORAL | 0 refills | Status: AC

## 2021-04-19 NOTE — Patient Instructions (Addendum)
Nice to see you. We will contact you with your swab results. Dermatology should contact you to schedule an appointment. Please use the nystatin for your thrush. We will treat you with nystatin for your vaginal itching.

## 2021-04-19 NOTE — Assessment & Plan Note (Addendum)
Likely yeast infection.  We will treat with Diflucan.  Swab sent for yeast and BV.  She denies any recent sexual activity.

## 2021-04-19 NOTE — Assessment & Plan Note (Signed)
Refer to dermatology 

## 2021-04-19 NOTE — Assessment & Plan Note (Signed)
Patient with evidence of thrush in her mouth.  We will treat with nystatin swish and swallow.  She will appropriately rinse her mouth after using her Trelegy.  If not improving she will let us know.

## 2021-04-19 NOTE — Progress Notes (Signed)
[name], MD Phone: [phone]  [name] is a 60 y.o. female who presents today for same-day visit.  Thrush: Patient reports white spots in her mouth and her tongue has been burning.  This has been going on about a week.  She uses Trelegy and does try to rinse her mouth appropriately.  Vaginal itching: Patient notes this is more of an external itching.  There is no vaginal discharge.  She is not sexually active in many years.  She notes she does not use soap in the area.  No dysuria.  She has chronic urinary frequency and urgency.  She has on [place].  Skin lesion left deltoid: This started after being bit by a bug about a year ago.  It has not been healing up.  She was referred to dermatology in [place] though she would like to see somebody closer.  Social History   Tobacco Use  Smoking Status Former   Packs/day: 1.00   Years: 38.00   Pack years: 38.00   Types: Cigarettes   Quit date: 08/12/2010   Years since quitting: 10.6  Smokeless Tobacco Never    Current Outpatient Medications on File Prior to Visit  Medication Sig Dispense Refill   albuterol (VENTOLIN HFA) 108 (90 Base) MCG/ACT inhaler Inhale 2 puffs into the lungs every 6 (six) hours as needed for wheezing or shortness of breath. 1 g 12   aspirin EC 81 MG tablet Take 1 tablet (81 mg total) by mouth daily. 90 tablet 3   Azelastine HCl 0.15 % SOLN Place 2 sprays into both nostrils daily as needed (allergies). 30 mL 11   cyanocobalamin (,VITAMIN B-12,) 1000 MCG/ML injection Inject 1 mL (1,000 mcg total) into the muscle every 30 (thirty) days. 4 mL 3   cyclobenzaprine (FLEXERIL) 5 MG tablet Take 1-2 tablets (5-10 mg total) by mouth daily as needed for muscle spasms. 60 tablet 11   dapagliflozin propanediol (FARXIGA) 5 MG TABS tablet Take 1 tablet (5 mg total) by mouth daily before breakfast. DISCONTINUE METFORMIN 90 tablet 1   dicyclomine (BENTYL) 10 MG capsule Take 1 capsule (10 mg total) by mouth 3 (three) times  daily before meals. As needed for abdominal pain 120 capsule 11   esomeprazole (NEXIUM) 40 MG capsule Take 1 capsule (40 mg total) by mouth daily. 90 capsule 3   Evolocumab (REPATHA SURECLICK) 151 MG/ML SOAJ INJECT 1 PEN INTO THE SKIN EVERY 14 DAYS 6 mL 4   ezetimibe (ZETIA) 10 MG tablet Take 1 tablet (10 mg total) by mouth daily. 90 tablet 3   fluticasone (FLONASE) 50 MCG/ACT nasal spray Place 2 sprays into both nostrils daily as needed for allergies. Max dose 2 sprays each nostril 48 g 3   Fluticasone-Umeclidin-Vilant (TRELEGY ELLIPTA) 100-62.5-25 MCG/INH AEPB INHALE 1 PUFF ONCE DAILY 60 each 11   furosemide (LASIX) 20 MG tablet 1 tablet once qd and 2nd tablet if needed fluid build up prn 180 tablet 3   hydrOXYzine (ATARAX/VISTARIL) 25 MG tablet TAKE 1 TO 2 TABLETS BY MOUTH ONCE DAILY AS NEEDED 60 tablet 11   ipratropium-albuterol (DUONEB) 0.5-2.5 (3) MG/3ML SOLN Take 3 mLs by nebulization 2 (two) times daily as needed (wheezing/shortness of breath). 360 mL 11   isosorbide mononitrate (IMDUR) 30 MG 24 hr tablet Take 1 tablet (30 mg total) by mouth 2 (two) times daily. 180 tablet 3   Lancets (ONETOUCH DELICA PLUS VOHYWV37T) MISC 1 Device by Does not apply route in the morning and at bedtime. 180 each  3   levocetirizine (XYZAL) 5 MG tablet Take 1 tablet (5 mg total) by mouth at bedtime as needed for allergies. 90 tablet 3   linaclotide (LINZESS) 72 MCG capsule TAKE 1 CAPSULE BY MOUTH ONCE DAILY BEFORE BREAKFAST 90 capsule 3   losartan (COZAAR) 25 MG tablet Take 0.5 tablets (12.5 mg total) by mouth daily. 45 tablet 3   meclizine (ANTIVERT) 25 MG tablet TAKE 1 TABLET BY MOUTH TWICE DAILY AS NEEDED FOR DIZZINESS 60 tablet 11   metoprolol succinate (TOPROL-XL) 50 MG 24 hr tablet TAKE 1 TABLET BY MOUTH ONCE DAILY WITH OR IMMEDIATELY FOLLOWING A MEAL 90 tablet 3   mirabegron ER (MYRBETRIQ) 25 MG TB24 tablet Take 1 tablet (25 mg total) by mouth daily. 90 tablet 3   montelukast (SINGULAIR) 10 MG tablet Take  1 tablet (10 mg total) by mouth at bedtime. 90 tablet 3   mupirocin ointment (BACTROBAN) 2 % Apply 1 application topically 2 (two) times daily. Both nostrils (nose) x 5 days 30 g 0   NEEDLE, DISP, 25 G 25G X 1-1/2" MISC 1 Device by Does not apply route every 30 (thirty) days. 4 each 3   nitroGLYCERIN (NITROSTAT) 0.4 MG SL tablet Place 1 tablet (0.4 mg total) under the tongue every 5 (five) minutes as needed. 25 tablet 4   olopatadine (PATANOL) 0.1 % ophthalmic solution Place 1 drop into both eyes 2 (two) times daily. 15 mL 3   ondansetron (ZOFRAN) 4 MG tablet Take 1 tablet (4 mg total) by mouth every 8 (eight) hours as needed for nausea or vomiting. 20 tablet 5   ONETOUCH VERIO test strip USE AS DIRECTED 200 each 3   PARoxetine (PAXIL) 20 MG tablet Take 1 tablet (20 mg total) by mouth daily. 90 tablet 3   potassium chloride (KLOR-CON) 10 MEQ tablet Take 1 tablet (10 mEq total) by mouth daily. 90 tablet 3   pregabalin (LYRICA) 75 MG capsule Take 1 capsule (75 mg total) by mouth 2 (two) times daily. 60 capsule 5   rivaroxaban (XARELTO) 2.5 MG TABS tablet Take 1 tablet by mouth twice daily 180 tablet 1   rosuvastatin (CRESTOR) 20 MG tablet Take 1 tablet (20 mg total) by mouth at bedtime. 90 tablet 3   sodium chloride (OCEAN) 0.65 % SOLN nasal spray Place 2 sprays into both nostrils daily as needed for congestion. 30 mL 11   traZODone (DESYREL) 50 MG tablet Take 0.5-1 tablets (25-50 mg total) by mouth at bedtime as needed for sleep. 90 tablet 3   No current facility-administered medications on file prior to visit.     ROS see history of present illness  Objective  Physical Exam Vitals:   04/19/21 1325  BP: 115/80  Pulse: 72  Temp: 98.1 F (36.7 C)  SpO2: 98%    BP Readings from Last 3 Encounters:  04/19/21 115/80  04/16/21 133/88  03/14/21 122/72   Wt Readings from Last 3 Encounters:  04/19/21 220 lb 3.2 oz (99.9 kg)  04/16/21 222 lb 12.8 oz (101.1 kg)  03/14/21 220 lb 6.4 oz  (100 kg)    Physical Exam Constitutional:      General: She is not in acute distress.    Appearance: She is not diaphoretic.  HENT:     Mouth/Throat:     Comments: Scattered white spots on her oral mucosa Cardiovascular:     Rate and Rhythm: Normal rate and regular rhythm.     Heart sounds: Normal heart sounds.  Pulmonary:  Effort: Pulmonary effort is normal.     Breath sounds: Normal breath sounds.  Genitourinary:    Comments: Fulton Mole, CMA served as chaperone, bilateral labia and external surrounding tissues appear normal, slightly atrophic vaginal mucosa, cervix is not identified, slightly yellowish minimal discharge in the posterior vaginal vault, no discomfort on bimanual exam, cervix not identified on bimanual exam Skin:    General: Skin is warm and dry.       Neurological:     Mental Status: She is alert.     Assessment/Plan: Please see individual problem list.  Problem List Items Addressed This Visit     Skin lesion    Refer to dermatology.      Relevant Orders   Ambulatory referral to Dermatology   Thrush    Patient with evidence of thrush in her mouth.  We will treat with nystatin swish and swallow.  She will appropriately rinse her mouth after using her Trelegy.  If not improving she will let us know.      Relevant Medications   nystatin (MYCOSTATIN) 100000 UNIT/ML suspension   fluconazole (DIFLUCAN) 150 MG tablet   Vaginal itching    Likely yeast infection.  We will treat with Diflucan.  Swab sent for yeast and BV.  She denies any recent sexual activity.      Relevant Orders   Cervicovaginal ancillary only( )   Other Visit Diagnoses     Need for immunization against influenza    -  Primary   Relevant Orders   Flu Vaccine QUAD 30mo+IM (Fluarix, Fluzone & Alfiuria Quad PF) (Completed)      Return if symptoms worsen or fail to improve.  This visit occurred during the SARS-CoV-2 public health emergency.  Safety protocols were in  place, including screening questions prior to the visit, additional usage of staff PPE, and extensive cleaning of exam room while observing appropriate contact time as indicated for disinfecting solutions.    [name], MD [place]

## 2021-04-22 LAB — CERVICOVAGINAL ANCILLARY ONLY
Bacterial Vaginitis (gardnerella): NEGATIVE
Candida Glabrata: NEGATIVE
Candida Vaginitis: NEGATIVE
Comment: NEGATIVE
Comment: NEGATIVE
Comment: NEGATIVE

## 2021-04-23 DIAGNOSIS — M6281 Muscle weakness (generalized): Secondary | ICD-10-CM | POA: Diagnosis not present

## 2021-04-23 DIAGNOSIS — M25511 Pain in right shoulder: Secondary | ICD-10-CM | POA: Diagnosis not present

## 2021-04-23 DIAGNOSIS — M542 Cervicalgia: Secondary | ICD-10-CM | POA: Diagnosis not present

## 2021-04-25 ENCOUNTER — Ambulatory Visit: Payer: Medicare HMO

## 2021-04-25 ENCOUNTER — Other Ambulatory Visit: Payer: Self-pay

## 2021-04-25 DIAGNOSIS — G4733 Obstructive sleep apnea (adult) (pediatric): Secondary | ICD-10-CM

## 2021-04-29 ENCOUNTER — Telehealth: Payer: Self-pay | Admitting: Pulmonary Disease

## 2021-04-29 DIAGNOSIS — G4733 Obstructive sleep apnea (adult) (pediatric): Secondary | ICD-10-CM | POA: Diagnosis not present

## 2021-04-29 NOTE — Telephone Encounter (Signed)
HST 04/25/21 >> AHI 13.1, SpO2 low 85%   Please let her know her sleep study shows mild sleep apnea.  Options are 1) arrange for auto CPAP set up now and then have follow up in 2 months after getting auto CPAP, or 2) arrange for follow up now to discuss treatment options in more detail.  If she prefers to start auto CPAP now, then please send order to auto CPAP with range 5 to 15 cm H2O with heated humidity and mask of choice.

## 2021-04-30 DIAGNOSIS — M25511 Pain in right shoulder: Secondary | ICD-10-CM | POA: Diagnosis not present

## 2021-04-30 DIAGNOSIS — M6281 Muscle weakness (generalized): Secondary | ICD-10-CM | POA: Diagnosis not present

## 2021-04-30 DIAGNOSIS — M542 Cervicalgia: Secondary | ICD-10-CM | POA: Diagnosis not present

## 2021-05-01 ENCOUNTER — Telehealth: Payer: Self-pay | Admitting: Pulmonary Disease

## 2021-05-01 ENCOUNTER — Other Ambulatory Visit: Payer: Self-pay

## 2021-05-01 DIAGNOSIS — G4733 Obstructive sleep apnea (adult) (pediatric): Secondary | ICD-10-CM

## 2021-05-01 NOTE — Telephone Encounter (Signed)
ATC patient LMTCB at [place] office.

## 2021-05-01 NOTE — Telephone Encounter (Signed)
Spoke with patient and notified her of HST results. She preferred to get cpap now and follow up 2 months after getting cpap machine. Will place the order. Nothing further needed at this time.

## 2021-05-01 NOTE — Telephone Encounter (Signed)
Pt was made aware of HST results by Fritzi Mandes, CMA. Nothing further needed.

## 2021-05-02 DIAGNOSIS — D352 Benign neoplasm of pituitary gland: Secondary | ICD-10-CM | POA: Diagnosis not present

## 2021-05-06 ENCOUNTER — Other Ambulatory Visit: Payer: Self-pay | Admitting: Cardiovascular Disease

## 2021-05-09 ENCOUNTER — Other Ambulatory Visit: Payer: Self-pay | Admitting: Cardiovascular Disease

## 2021-05-09 ENCOUNTER — Ambulatory Visit (INDEPENDENT_AMBULATORY_CARE_PROVIDER_SITE_OTHER): Payer: Medicare HMO | Admitting: Pharmacist

## 2021-05-09 DIAGNOSIS — I2510 Atherosclerotic heart disease of native coronary artery without angina pectoris: Secondary | ICD-10-CM

## 2021-05-09 DIAGNOSIS — E782 Mixed hyperlipidemia: Secondary | ICD-10-CM

## 2021-05-09 DIAGNOSIS — E119 Type 2 diabetes mellitus without complications: Secondary | ICD-10-CM

## 2021-05-09 DIAGNOSIS — J449 Chronic obstructive pulmonary disease, unspecified: Secondary | ICD-10-CM

## 2021-05-09 DIAGNOSIS — I5022 Chronic systolic (congestive) heart failure: Secondary | ICD-10-CM

## 2021-05-09 NOTE — Chronic Care Management (AMB) (Signed)
Chronic Care Management Pharmacy Note  05/09/2021 Name:  [name] MRN:  [id number] DOB:  04-27-1961  Subjective: [name] is an 60 y.o. year old female who is a primary patient of [name], [name], MD.  Collaborating with covering provider while PCP is on leave. The CCM team was consulted for assistance with disease management and care coordination needs.    Engaged with patient by telephone for follow up visit in response to provider referral for pharmacy case management and/or care coordination services.   Consent to Services:  The patient was given information about Chronic Care Management services, agreed to services, and gave verbal consent prior to initiation of services.  Please see initial visit note for detailed documentation.   Patient Care Team: [name], [name], MD as PCP - General (Internal Medicine) Minna Merritts, MD as PCP - Cardiology (Cardiology) De Hollingshead, RPH-CPP (Pharmacist)  Objective:  Lab Results  Component Value Date   CREATININE 1.03 01/10/2021   CREATININE 1.10 (H) 10/04/2020   CREATININE 1.01 (H) 08/21/2020    Lab Results  Component Value Date   HGBA1C 6.7 (H) 01/10/2021   Last diabetic Eye exam:  Lab Results  Component Value Date/Time   HMDIABEYEEXA No Retinopathy 01/17/2021 12:00 AM    Last diabetic Foot exam: No results found for: HMDIABFOOTEX      Component Value Date/Time   CHOL 133 01/10/2021 1050   CHOL 143 09/16/2018 1204   TRIG 130.0 01/10/2021 1050   HDL 51.40 01/10/2021 1050   HDL 44 09/16/2018 1204   CHOLHDL 3 01/10/2021 1050   VLDL 26.0 01/10/2021 1050   LDLCALC 56 01/10/2021 1050   LDLCALC 79 09/16/2018 1204    Hepatic Function Latest Ref Rng & Units 01/10/2021 07/24/2020 08/25/2019  Total Protein 6.0 - 8.3 g/dL 7.1 6.1 8.3(H)  Albumin 3.5 - 5.2 g/dL 4.5 3.7 4.6  AST 0 - 37 U/L _0 ALT 0 - 35 U/L _1 Alk Phosphatase 39 - 117 U/L 96 83 71  Total Bilirubin 0.2 -  1.2 mg/dL 0.3 0.3 0.6  Bilirubin, Direct 0.00 - 0.40 mg/dL - - -    Lab Results  Component Value Date/Time   TSH 1.520 04/16/2021 10:03 AM   TSH 0.73 07/24/2020 11:53 AM   FREET4 0.82 04/16/2021 10:03 AM    CBC Latest Ref Rng & Units 02/27/2021 01/10/2021 08/21/2020  WBC 4.0 - 10.5 K/uL 7.4 6.1 5.2  Hemoglobin 12.0 - 15.0 g/dL 14.1 14.4 14.3  Hematocrit 36.0 - 46.0 % 42.1 43.4 42.1  Platelets 150.0 - 400.0 K/uL 151.0 168.0 152    Lab Results  Component Value Date/Time   VD25OH 21.23 (L) 01/10/2021 10:50 AM   VD25OH 22.21 (L) 07/24/2020 11:53 AM    Social History   Tobacco Use  Smoking Status Former   Packs/day: 1.00   Years: 38.00   Pack years: 38.00   Types: Cigarettes   Quit date: 08/12/2010   Years since quitting: 10.7  Smokeless Tobacco Never   BP Readings from Last 3 Encounters:  04/19/21 115/80  04/16/21 133/88  03/14/21 122/72   Pulse Readings from Last 3 Encounters:  04/19/21 72  04/16/21 71  03/14/21 74   Wt Readings from Last 3 Encounters:  04/19/21 220 lb 3.2 oz (99.9 kg)  04/16/21 222 lb 12.8 oz (101.1 kg)  03/14/21 220 lb 6.4 oz (100 kg)    Assessment: Review of patient past medical history, allergies, medications, health status,  including review of consultants reports, laboratory and other test data, was performed as part of comprehensive evaluation and provision of chronic care management services.   SDOH:  (Social Determinants of Health) assessments and interventions performed:  SDOH Interventions    Flowsheet Row Most Recent Value  SDOH Interventions   Financial Strain Interventions Intervention Not Indicated       CCM Care Plan  Allergies  Allergen Reactions   Latex Rash        Shellfish Allergy Anaphylaxis    Hard shellfish/swelling in throat   Sulfonamide Derivatives Anaphylaxis    Swelling in throat.   Watermelon [Citrullus Vulgaris] Other (See Comments)    Throat itchy   Soy Allergy     Diarrhea/upset stomach     Tomato  Itching    Throat itchy - also Raw carrots   Other Swelling    Medications Reviewed Today     Reviewed by De Hollingshead, RPH-CPP (Pharmacist) on 05/09/21 at 1437  Med List Status: <None>   Medication Order Taking? Sig Documenting Provider Last Dose Status Informant  albuterol (VENTOLIN HFA) 108 (90 Base) MCG/ACT inhaler [id number] No Inhale 2 puffs into the lungs every 6 (six) hours as needed for wheezing or shortness of breath.  Patient not taking: Reported on 05/09/2021   Chesley Mires, MD Not Taking Active   aspirin EC 81 MG tablet [id number] Yes Take 1 tablet (81 mg total) by mouth daily. [name], [name], MD Taking Active   Azelastine HCl 0.15 % SOLN [id number] Yes Place 2 sprays into both nostrils daily as needed (allergies). [name], [name], MD Taking Active   cyanocobalamin (,VITAMIN B-12,) 1000 MCG/ML injection [id number] No Inject 1 mL (1,000 mcg total) into the muscle every 30 (thirty) days.  Patient not taking: Reported on 05/09/2021   [name], [name], MD Not Taking Active   cyclobenzaprine (FLEXERIL) 5 MG tablet [id number] No Take 1-2 tablets (5-10 mg total) by mouth daily as needed for muscle spasms.  Patient not taking: Reported on 05/09/2021   [name], [name], MD Not Taking Active   dapagliflozin propanediol (FARXIGA) 5 MG TABS tablet [id number] Yes Take 1 tablet (5 mg total) by mouth daily before breakfast. DISCONTINUE METFORMIN Leone Haven, MD Taking Active   dicyclomine (BENTYL) 10 MG capsule [id number] No Take 1 capsule (10 mg total) by mouth 3 (three) times daily before meals. As needed for abdominal pain  Patient not taking: Reported on 05/09/2021   [name], [name], MD Not Taking Active   esomeprazole (NEXIUM) 40 MG capsule [id number] Yes Take 1 capsule (40 mg total) by mouth daily. [name], [name], MD Taking Active   Evolocumab [facility] SURECLICK) 099 MG/ML Darden Palmer [id number] Yes INJECT 1 PEN INTO THE SKIN EVERY 14  DAYS [name], Kathlene November, MD Taking Active   ezetimibe (ZETIA) 10 MG tablet [id number] Yes Take 1 tablet (10 mg total) by mouth daily. Minna Merritts, MD Taking Active   fluticasone Asencion Islam) 50 MCG/ACT nasal spray [id number] Yes Place 2 sprays into both nostrils daily as needed for allergies. Max dose 2 sprays each nostril Leone Haven, MD Taking Active   Fluticasone-Umeclidin-Vilant (TRELEGY ELLIPTA) 100-62.5-25 MCG/INH AEPB [id number] Yes INHALE 1 PUFF ONCE DAILY Chesley Mires, MD Taking Active   furosemide (LASIX) 20 MG tablet [id number] Yes TAKE 1 TABLET BY MOUTH ONCE DAILY AND A SECOND TABLET IF NEEDED FOR FLUID BUILD UP [name], Kathlene November, MD Taking Active   hydrOXYzine (ATARAX/VISTARIL) 25 MG tablet [id number] Yes TAKE 1  TO 2 TABLETS BY MOUTH ONCE DAILY AS NEEDED [name], [name], MD Taking Active   ipratropium-albuterol (DUONEB) 0.5-2.5 (3) MG/3ML SOLN [id number] No Take 3 mLs by nebulization 2 (two) times daily as needed (wheezing/shortness of breath).  Patient not taking: Reported on 05/09/2021   [name], [name], MD Not Taking Active   isosorbide mononitrate (IMDUR) 30 MG 24 hr tablet [id number] Yes Take 1 tablet (30 mg total) by mouth 2 (two) times daily. Minna Merritts, MD Taking Active            Med Note Nat Christen May 09, 2021  2:29 PM) States she is taking once daily  Lancets (ONETOUCH DELICA PLUS [id number]) MISC [id number] Yes 1 Device by Does not apply route in the morning and at bedtime. [name], [name], MD Taking Active   levocetirizine (XYZAL) 5 MG tablet [id number] Yes Take 1 tablet (5 mg total) by mouth at bedtime as needed for allergies. [name], [name], MD Taking Active   linaclotide Rolan Lipa) 72 MCG capsule [id number] Yes TAKE 1 CAPSULE BY MOUTH ONCE DAILY BEFORE BREAKFAST [name], [name], MD Taking Active            Med Note Nat Christen May 09, 2021  2:32 PM)    losartan (COZAAR) 25 MG tablet  [id number] Yes Take 0.5 tablets (12.5 mg total) by mouth daily. Minna Merritts, MD Taking Active   meclizine (ANTIVERT) 25 MG tablet [id number] Yes TAKE 1 TABLET BY MOUTH TWICE DAILY AS NEEDED FOR DIZZINESS [name], [name], MD Taking Active   metoprolol succinate (TOPROL-XL) 50 MG 24 hr tablet [id number] Yes TAKE 1 TABLET BY MOUTH ONCE DAILY WITH OR IMMEDIATELY FOLLOWING A MEAL [name], Kathlene November, MD Taking Active   mirabegron ER (MYRBETRIQ) 25 MG TB24 tablet [id number] Yes Take 1 tablet (25 mg total) by mouth daily. [name], [name], MD Taking Active   montelukast (SINGULAIR) 10 MG tablet [id number] Yes Take 1 tablet (10 mg total) by mouth at bedtime. [name], [name], MD Taking Active   mupirocin ointment (BACTROBAN) 2 % [id number]  Apply 1 application topically 2 (two) times daily. Both nostrils (nose) x 5 days [name], [name], MD  Active   NEEDLE, DISP, 25 G 25G X 1-1/2" MISC [id number]  1 Device by Does not apply route every 30 (thirty) days. [name], [name], MD  Active   nitroGLYCERIN (NITROSTAT) 0.4 MG SL tablet [id number]  Place 1 tablet (0.4 mg total) under the tongue every 5 (five) minutes as needed. Minna Merritts, MD  Active   nystatin (MYCOSTATIN) 100000 UNIT/ML suspension [id number] Yes Take 5 mLs (500,000 Units total) by mouth 4 (four) times daily. Leone Haven, MD Taking Active   olopatadine (PATANOL) 0.1 % ophthalmic solution [id number] Yes Place 1 drop into both eyes 2 (two) times daily. Leone Haven, MD Taking Active   ondansetron [facility]) 4 MG tablet [id number]  Take 1 tablet (4 mg total) by mouth every 8 (eight) hours as needed for nausea or vomiting. [name], [name], MD  Active   [facility] VERIO test strip [id number]  USE AS DIRECTED [name], [name], MD  Active   PARoxetine (PAXIL) 20 MG tablet [id number] Yes Take 1 tablet (20 mg total) by mouth daily. [name], [name], MD Taking Active   potassium chloride  (KLOR-CON) 10 MEQ tablet [id number] Yes Take 1 tablet (10 mEq total) by mouth daily. Minna Merritts, MD Taking Active  pregabalin (LYRICA) 75 MG capsule [id number]  Take 1 capsule (75 mg total) by mouth 2 (two) times daily. [name], [name], MD  Active   rivaroxaban Alveda Reasons) 2.5 MG TABS tablet [id number] Yes Take 1 tablet by mouth twice daily [name], Kathlene November, MD Taking Active   rosuvastatin (CRESTOR) 20 MG tablet [id number] Yes Take 1 tablet (20 mg total) by mouth at bedtime. Minna Merritts, MD Taking Active   sodium chloride (OCEAN) 0.65 % SOLN nasal spray [id number]  Place 2 sprays into both nostrils daily as needed for congestion. [name], [name], MD  Active   traZODone (DESYREL) 50 MG tablet [id number] Yes Take 0.5-1 tablets (25-50 mg total) by mouth at bedtime as needed for sleep. [name], [name], MD Taking Active             Patient Active Problem List   Diagnosis Date Noted   Thrush 04/19/2021   Skin lesion 04/19/2021   Vaginal itching 04/19/2021   Vitamin D deficiency 01/14/2021   Breast mass, right 01/14/2021   Annual physical exam 01/14/2021   Pruritus of both eyes 09/21/2020   Lipoma of right lower extremity 08/30/2020   Hypertension associated with diabetes ([place]) 07/24/2020   Abnormality of both breasts on screening mammogram 05/14/2020   Bilateral hip pain 04/19/2020   Insomnia 04/19/2020   Chronic low back pain 02/22/2020   Internal hemorrhoids 02/22/2020   Benign breast cyst in female, right 02/22/2020   OSA on CPAP 12/15/2019   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[place]) 12/02/2019   Sensorimotor neuropathy 12/02/2019   Irritable bowel syndrome with both constipation and diarrhea 10/13/2019   Mild intermittent asthma 10/13/2019   PUD (peptic ulcer disease)    Status post total knee replacement using cement, right 04/14/2019   Overactive bladder 03/10/2019   Mild persistent asthma 11/29/2018   DDD (degenerative disc disease), cervical 11/26/2018   Chronic  constipation 11/26/2018   Fatty liver 11/26/2018   Adrenal adenoma, left 11/26/2018   Allergic rhinitis 08/26/2018   Bunion of right foot 08/26/2018   Fall 05/14/2018   Anxiety and depression 04/05/2018   Cervical radiculopathy 02/24/2018   Numbness and tingling in right hand 02/24/2018   Chronic neck pain 12/18/2017   Type 2 diabetes mellitus without complication, without long-term current use of insulin ([place]) 12/18/2017   Urinary incontinence 12/18/2017   Lumbar radiculopathy 12/18/2017   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   Abnormal feces    Benign neoplasm of ascending colon    Intractable vomiting with nausea    Acute esophagogastric ulcer    Gastritis without bleeding    Vasomotor flushing 11/24/2016   Morbid obesity ([place]) 10/29/2015   Back ache 06/19/2015   Colon polyp 06/19/2015   CCF (congestive cardiac failure) ([place]) 06/19/2015   Family history of colon cancer 06/19/2015   Orthostatic hypotension 04/30/2015   Hematochezia 01/11/2015   History of colonic polyps 01/11/2015   Atypical chest pain 01/20/2014   COPD with chronic bronchitis and emphysema ([place]) 01/03/2013   Right knee pain 12/20/2011   HTN (hypertension) 00/92/3300   Systolic CHF, chronic (HCC)    History of cervical cancer    Depression    GERD (gastroesophageal reflux disease)    Seasonal allergies    History of drug abuse ([place])    PVD (peripheral vascular disease) ([place]) 02/03/2011   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   Smoking history 12/16/2010   Hyperlipidemia 08/23/2010   Coronary artery disease involving native coronary artery of native heart without angina pectoris 08/23/2010  CLAUDICATION 08/23/2010   CAROTID BRUIT 08/23/2010   SHORTNESS OF BREATH 08/23/2010    Immunization History  Administered Date(s) Administered   Influenza Split 05/22/2015, 04/21/2016   Influenza,inj,Quad PF,6+ Mos 04/05/2018, 03/29/2019, 04/19/2020, 04/19/2021   PFIZER(Purple Top)SARS-COV-2 Vaccination 01/03/2020,  01/24/2020   Tdap 05/05/2019    Conditions to be addressed/monitored: CHF, CAD, and DMII  Care Plan : Medication Management  Updates made by De Hollingshead, RPH-CPP since 05/09/2021 12:00 AM     Problem: Diabetes, ASCVD, HF, COPD      Long-Range Goal: Disease Progression Prevention   Start Date: 03/28/2021  Recent Progress: On track  Priority: High  Note:   Current Barriers:  Does not adhere to prescribed medication regimen for metformin  Pharmacist Clinical Goal(s):  Over the next 90 days, patient will achieve adherence to monitoring guidelines and medication adherence to achieve therapeutic efficacy through collaboration with PharmD and provider.    Interventions: 1:1 collaboration with [name], [name], MD regarding development and update of comprehensive plan of care as evidenced by provider attestation and co-signature Inter-disciplinary care team collaboration (see longitudinal plan of care) Comprehensive medication review performed; medication list updated in electronic medical record  Health Maintenance   Yearly diabetic eye exam: up to date Yearly diabetic foot exam: due- placed reminder in upcoming visit notes Urine microalbumin: up to date Yearly influenza vaccination: up to date Td/Tdap vaccination: up to date Pneumonia vaccination: due - recommend PCV20 COVID vaccinations: due - recommend bivalent booster Shingrix vaccinations: due - will discuss moving forward Colonoscopy: up to date  Diabetes: Controlled per A1c, but with opportunities for optimization; current treatment: Farxiga 5 mg daily  Current glucose readings: fasting: all <110; post prandial: all <170 Reports that she overall feels better on Farxiga than she did on metformin. Denies any continued concerns with vaginal itching.  Reviewed goal A1c, goal fasting, and goal 2 hour post prandial  Recommended to continue current regimen   Hyperlipidemia, PAD, PVD, CAD, secondary ASCVD  prevention: Controlled; current treatment: rosuvastatin 20 mg daily, ezetimibe 10 mg daily, Repatha 140 mg daily; follows w/ Dr. [name] Antiplatelet/coagulant regimen: Xarelto 2.5 mg BID, aspirin 81 mg daily Antianginal therapy: isosorbide mononitrate 30 mg BID- though reports she just takes QAM; no recent use of nitroglycerin Recommended to continue current regimen at this time. Advised to notify Dr. [name] that she is just taking isosorbide QAM at her next appointment.  Heart Failure: Appropriately managed; current treatment:  ARNI/ACEi/ARB: losartan 12.5 mg daily Beta blocker: metoprolol succinate 50 mg daily SGLT2: Farxiga 5 mg daily Mineralocorticoid Receptor Antagonist: none, no documented history Diuretic: furosemide 20 mg QAM, 20 mg QPM if needed; potassium 10 mEq daily Most recent ECHO: 03/2017 with EF 40-45% Current home vitals: not checking. Encouraged to do so Recommended to continue current regimen at this time along with collaboration with cardiology.   Chronic Obstructive Pulmonary Disease, hx tobacco use, allergies: Controlled; current treatment: Trelegy 100/62.5/25 mcg daily, albuterol HFA PRN, Duonebs PRN; levocetirizine 5 mg daily, montelukast 10 mg daily, fluticasone intranasal BID, azelastine nasal PRN, saline nasal spray PRN, olopatadine 0.1% eye drops BID Does report thrush has improved. Reports she rinses mouth after taking Trelegy Most recent Pulmonary Function Testing: PFT 05/16/16: FEV1 88%, FEV1% 79 Advised to brush teeth/tongue after Trelegy use to help reduce risk of thrush Recommended to continue current regimen at this time along with collaboration with pulmonary.   Depression, Anxiety, Pain: Moderately well controlled per patient report; current regimen: paroxetine 20 mg daily, pregabalin  75 mg BID, hydroxyzine 25 mg BID PRN; cyclobenzaprine 5 mg TID PRN, trazodone 25-50 mg QPM PRN Previously recommended to continue current regimen at this time  GERD,  IBS: Controlled per patient report; current regimen: esomeprazole 40 mg QAM, dicyclomine 10 mg TID PRN, ondansetron 4 mg PRN, Linzess 75 mcg daily Previously recommended to continue current regimen at this time.   Supplements: Vitamin B12 injection once monthly - reports that her daughter or her pastor gives this to her, and they haven't by to give it in a while. Encouraged to communicate with her support system to see if someone can give this to her and teach her to use. In the meantime, she plans to switch to oral B12. Encouraged her to discuss this with PCP at upcoming appointment.    Patient Goals/Self-Care Activities Over the next 90 days, patient will:  - take medications as prescribed check glucose periodically, document, and provide at future appointments check blood pressure periodically, document, and provide at future appointments weigh daily, and contact provider if weight gain of >3 lbs in 1 day or >5 lbs per week  Follow Up Plan: Telephone follow up appointment with care management team member scheduled for: ~ 4 months      Medication Assistance: None required.  Patient affirms current coverage meets needs.  Patient's preferred pharmacy is:  [facility] [address] ([address]), [place] - [address] [place] ([place]) [place] [address] Phone: [phone] Fax: [phone]    Follow Up:  Patient agrees to Care Plan and Follow-up.  Plan: Telephone follow up appointment with care management team member scheduled for:  4 months  Catie Darnelle Maffucci, PharmD, [place], [place] Clinical Pharmacist [employer] [facility] [employer] [phone]

## 2021-05-09 NOTE — Patient Instructions (Signed)
Visit Information  PATIENT GOALS:  Goals Addressed               This Visit's Progress     Patient Stated     Medication Monitoring (pt-stated)        Patient Goals/Self-Care Activities Over the next 90 days, patient will:  - take medications as prescribed check glucose periodically, document, and provide at future appointments check blood pressure periodically, document, and provide at future appointments weigh daily, and contact provider if weight gain of >3 lbs in 1 day or >5 lbs per week         Patient verbalizes understanding of instructions provided today and agrees to view in [place].   Plan: Telephone follow up appointment with care management team member scheduled for:  4 months  Catie Darnelle Maffucci, PharmD, [place], [place] Clinical Pharmacist [employer] at [employer] [phone]

## 2021-05-16 DIAGNOSIS — M6281 Muscle weakness (generalized): Secondary | ICD-10-CM | POA: Diagnosis not present

## 2021-05-16 DIAGNOSIS — M542 Cervicalgia: Secondary | ICD-10-CM | POA: Diagnosis not present

## 2021-05-16 DIAGNOSIS — M25511 Pain in right shoulder: Secondary | ICD-10-CM | POA: Diagnosis not present

## 2021-05-17 DIAGNOSIS — G4710 Hypersomnia, unspecified: Secondary | ICD-10-CM | POA: Diagnosis not present

## 2021-05-17 DIAGNOSIS — G4733 Obstructive sleep apnea (adult) (pediatric): Secondary | ICD-10-CM | POA: Diagnosis not present

## 2021-05-17 DIAGNOSIS — I259 Chronic ischemic heart disease, unspecified: Secondary | ICD-10-CM | POA: Diagnosis not present

## 2021-05-21 DIAGNOSIS — M6281 Muscle weakness (generalized): Secondary | ICD-10-CM | POA: Diagnosis not present

## 2021-05-21 DIAGNOSIS — M542 Cervicalgia: Secondary | ICD-10-CM | POA: Diagnosis not present

## 2021-05-21 DIAGNOSIS — M25511 Pain in right shoulder: Secondary | ICD-10-CM | POA: Diagnosis not present

## 2021-05-27 DIAGNOSIS — E119 Type 2 diabetes mellitus without complications: Secondary | ICD-10-CM | POA: Diagnosis not present

## 2021-05-27 DIAGNOSIS — I2510 Atherosclerotic heart disease of native coronary artery without angina pectoris: Secondary | ICD-10-CM

## 2021-05-27 DIAGNOSIS — I5022 Chronic systolic (congestive) heart failure: Secondary | ICD-10-CM

## 2021-05-27 DIAGNOSIS — J449 Chronic obstructive pulmonary disease, unspecified: Secondary | ICD-10-CM

## 2021-05-27 DIAGNOSIS — E782 Mixed hyperlipidemia: Secondary | ICD-10-CM | POA: Diagnosis not present

## 2021-05-27 DIAGNOSIS — M542 Cervicalgia: Secondary | ICD-10-CM | POA: Diagnosis not present

## 2021-05-27 DIAGNOSIS — M6281 Muscle weakness (generalized): Secondary | ICD-10-CM | POA: Diagnosis not present

## 2021-05-27 DIAGNOSIS — M25511 Pain in right shoulder: Secondary | ICD-10-CM | POA: Diagnosis not present

## 2021-05-29 DIAGNOSIS — M542 Cervicalgia: Secondary | ICD-10-CM | POA: Diagnosis not present

## 2021-05-29 DIAGNOSIS — M6281 Muscle weakness (generalized): Secondary | ICD-10-CM | POA: Diagnosis not present

## 2021-05-29 DIAGNOSIS — M25511 Pain in right shoulder: Secondary | ICD-10-CM | POA: Diagnosis not present

## 2021-06-04 ENCOUNTER — Encounter: Payer: Self-pay | Admitting: Internal Medicine

## 2021-06-04 ENCOUNTER — Other Ambulatory Visit: Payer: Self-pay

## 2021-06-04 ENCOUNTER — Ambulatory Visit (INDEPENDENT_AMBULATORY_CARE_PROVIDER_SITE_OTHER): Payer: Medicare HMO | Admitting: Internal Medicine

## 2021-06-04 VITALS — BP 106/62 | HR 69 | Temp 97.5°F | Ht 65.0 in | Wt 219.4 lb

## 2021-06-04 DIAGNOSIS — M5432 Sciatica, left side: Secondary | ICD-10-CM

## 2021-06-04 DIAGNOSIS — M5442 Lumbago with sciatica, left side: Secondary | ICD-10-CM

## 2021-06-04 DIAGNOSIS — M21612 Bunion of left foot: Secondary | ICD-10-CM

## 2021-06-04 DIAGNOSIS — E559 Vitamin D deficiency, unspecified: Secondary | ICD-10-CM

## 2021-06-04 DIAGNOSIS — M5431 Sciatica, right side: Secondary | ICD-10-CM

## 2021-06-04 DIAGNOSIS — M67911 Unspecified disorder of synovium and tendon, right shoulder: Secondary | ICD-10-CM | POA: Diagnosis not present

## 2021-06-04 DIAGNOSIS — G8929 Other chronic pain: Secondary | ICD-10-CM

## 2021-06-04 DIAGNOSIS — R350 Frequency of micturition: Secondary | ICD-10-CM | POA: Diagnosis not present

## 2021-06-04 DIAGNOSIS — M21611 Bunion of right foot: Secondary | ICD-10-CM

## 2021-06-04 DIAGNOSIS — M79672 Pain in left foot: Secondary | ICD-10-CM

## 2021-06-04 DIAGNOSIS — M5441 Lumbago with sciatica, right side: Secondary | ICD-10-CM

## 2021-06-04 DIAGNOSIS — E538 Deficiency of other specified B group vitamins: Secondary | ICD-10-CM | POA: Diagnosis not present

## 2021-06-04 DIAGNOSIS — E119 Type 2 diabetes mellitus without complications: Secondary | ICD-10-CM

## 2021-06-04 MED ORDER — VITAMIN B-12 1000 MCG SL SUBL: 1.0000 ug | SUBLINGUAL_TABLET | Freq: Every day | SUBLINGUAL | 3 refills | Status: DC

## 2021-06-04 NOTE — Patient Instructions (Signed)

## 2021-06-04 NOTE — Progress Notes (Signed)
Chief Complaint  Patient presents with   Follow-up   F/u  1. C/o foot pain bunions and left sole pain near 2nd toe no injury  2. C/o low back pain chronic and right shoulde rpain and limited mobility PT ended last week [facility] and wants to continue  3. Wants to switch to oral B12 hard to give injections  4. C/o increased urinary freq  Review of Systems  Constitutional:  Negative for weight loss.  HENT:  Negative for hearing loss.   Eyes:  Negative for blurred vision.  Respiratory:  Negative for shortness of breath.   Cardiovascular:  Negative for chest pain.  Gastrointestinal:  Negative for abdominal pain and blood in stool.  Genitourinary:  Negative for dysuria.  Musculoskeletal:  Positive for joint pain. Negative for falls.  Skin:  Negative for rash.  Neurological:  Negative for headaches.  Psychiatric/Behavioral:  Negative for depression.   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 being followed by neurosurgery/eye doctor and pcp. monitoring size.causes sinus problems   Bronchitis    Cardiac arrhythmia    Cervical neck pain with evidence of disc disease    C5/6 disease, MRI done late 2012 - no records available   Chronic combined systolic and diastolic CHF (congestive heart failure) ([place])    a. 08/2010 Echo: mildly reduced EF 40-45%, mild diffuse hypokinesis; b. 06/2016 Echo: EF 50%, no rwma, mild to mod TR; c. 03/2017 Echo: EF 40-45%, Gr1 DD (prior echo reviewed and EF felt to be lower than reported).   Chronic pain    Cocaine abuse, in remission (HCC)    clean x 24 years   Colon polyps    COPD (chronic obstructive pulmonary disease) ([place])    a. 12/2018 PFT: No obvious obst/restrictive dzs.   Coronary artery disease    a. PCI of LCX 2003; b. PCI of the LAD 2012 with a (2.5 x 8 mm BMS);  c.s/p  CABG 4/12: L-LAD, VG-Dx, VG-OM, VG-RCA (Dr. Prescott Gum);  d. 01/2012 MV: inf infarct, attenuation, no ischemia; e. 02/2017 MV: signifi attenuation artifact. Fixed basal antlat/inflat scar vs artifact. Reversible apical lat and mid antlat defect - ? atten vs ischemia. F/u echo w/o wma->Med rx.   Depression    Diabetes mellitus without complication ([place])    Family history of colon cancer    Generalized headaches    frequent   GERD (gastroesophageal reflux disease)    Headache    Heart murmur    Hematochezia    Hepatitis    history of hepatitis b   History of cervical cancer    s/p cryotherapy   History of drug abuse ([place])    cocaine, marijuana, clean since 1989   History of hepatitis B    from eating undercooked liver   History of MI (myocardial infarction)    Hyperlipidemia    Hypertension    Ischemic cardiomyopathy    a. 03/2017 Echo: EF 40-45%, Gr1 DD.   Myocardial infarction ([place]) 2003, 2012   PAD (peripheral artery disease) ([place])    a. s/p Right SFA atherectomy and PTA 01/15/11; b. 07/2018 ABI: R 1.02, L 1.09.   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 12/2018 MRI Brain: Stable pituitary mass w/  32mm area of necrosis. Mass abuts R cavernous sinus w/o definite invasion.   Polyp of colon    Seasonal allergies    Sleep apnea    uses cpap   Smoking history    quit 07/2010   Thyroid disease    Urinary incontinence    Past Surgical History:  Procedure Laterality Date   ABDOMINAL HYSTERECTOMY     2003   CHOLECYSTECTOMY  1986   COLONOSCOPY  2008   3 polyps   COLONOSCOPY WITH PROPOFOL N/A 02/06/2015   Procedure: COLONOSCOPY WITH PROPOFOL;  Surgeon: Lucilla Lame, MD;  Location: [facility] ENDOSCOPY;  Service: Endoscopy;  Laterality: N/A;   COLONOSCOPY WITH PROPOFOL N/A 01/27/2017   Procedure: COLONOSCOPY WITH PROPOFOL;  Surgeon: Lucilla Lame, MD;  Location: [facility] ENDOSCOPY;  Service: Endoscopy;  Laterality: N/A;   CORONARY ANGIOPLASTY     w/ stent placement x2   CORONARY ARTERY BYPASS GRAFT  2012   Dr  [name]   CORONARY STENT PLACEMENT  2003   S/P MI   CORONARY STENT PLACEMENT  2007   [place]   ESOPHAGOGASTRODUODENOSCOPY (EGD) WITH PROPOFOL N/A 02/06/2015   Procedure: ESOPHAGOGASTRODUODENOSCOPY (EGD) WITH PROPOFOL;  Surgeon: Lucilla Lame, MD;  Location: [facility] ENDOSCOPY;  Service: Endoscopy;  Laterality: N/A;   ESOPHAGOGASTRODUODENOSCOPY (EGD) WITH PROPOFOL N/A 01/27/2017   Procedure: ESOPHAGOGASTRODUODENOSCOPY (EGD) WITH PROPOFOL;  Surgeon: Lucilla Lame, MD;  Location: [facility] ENDOSCOPY;  Service: Endoscopy;  Laterality: N/A;   ESOPHAGOGASTRODUODENOSCOPY (EGD) WITH PROPOFOL N/A 09/01/2019   Procedure: ESOPHAGOGASTRODUODENOSCOPY (EGD) WITH PROPOFOL;  Surgeon: Lin Landsman, MD;  Location: [place];  Service: Gastroenterology;  Laterality: N/A;   FEMORAL ARTERY STENT  10/2010   right sided (Dr. Burt Knack)   KNEE ARTHROSCOPY WITH MEDIAL MENISECTOMY Right 08/20/2017   Procedure: KNEE ARTHROSCOPY WITH MEDIAL  AND LATERAL MENISECTOMY;  Surgeon: Hessie Knows, MD;  Location: [facility] ORS;  Service: Orthopedics;  Laterality: Right;   POSTERIOR CERVICAL LAMINECTOMY N/A 03/08/2018   Procedure: POSTERIOR CERVICAL LAMINECTOMY-C7;  Surgeon: Deetta Perla, MD;  Location: [facility] ORS;  Service: Neurosurgery;  Laterality: N/A;   TONSILLECTOMY     TOTAL KNEE ARTHROPLASTY Right 04/14/2019   Procedure: RIGHT TOTAL KNEE ARTHROPLASTY;  Surgeon: Hessie Knows, MD;  Location: [facility] ORS;  Service: Orthopedics;  Laterality: Right;   TUBAL LIGATION     Family History  Problem Relation Age of Onset   Hypertension Father    Heart failure Father    Diabetes Father    Colon cancer Father 9   Glaucoma Father    Cancer Father        colorectal    Heart disease Father    Other Father        glaucoma   Breast cancer Mother 68       breast cancer, late 58's   Cancer Mother        breast   Brain cancer Sister    Arthritis Sister    Diabetes Sister    Hypertension Sister    Kidney disease Sister    Diabetes Brother     Hypertension Brother    Other Sister        brain tumor    Acute myelogenous leukemia Grandson        06/2019   Coronary artery disease Neg Hx    Stroke Neg Hx    Social History   Socioeconomic History   Marital status: Legally Separated    Spouse name: Not on file   Number of children: Not on file  Years of education: Not on file   Highest education level: Not on file  Occupational History   Not on file  Tobacco Use   Smoking status: Former    Packs/day: 1.00    Years: 38.00    Pack years: 38.00    Types: Cigarettes    Quit date: 08/12/2010    Years since quitting: 10.8   Smokeless tobacco: Never  Vaping Use   Vaping Use: Never used  Substance and Sexual Activity   Alcohol use: No   Drug use: No    Types: Cocaine    Comment: Remote Hx (crack cocaine and marijuana).none since 42yrs plus   Sexual activity: Yes  Other Topics Concern   Not on file  Social History Narrative   Caffeine: 1 cup coffee/day   Lives with family, no pets   Occupation: industrial work, 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   Edu: 11th grade   Activity: no regular exercise   Diet: good water, vegetables daily, low salt diet   Lives with sister and other family    No guns, wears seat belts, safe in relationship    2 kids    GED 1 year of college    Social Determinants of Health   Financial Resource Strain: Low Risk    Difficulty of Paying Living Expenses: Not hard at all  Food Insecurity: No Food Insecurity   Worried About Charity fundraiser in the Last Year: Never true   Arboriculturist in the Last Year: Never true  Transportation Needs: No Transportation Needs   Lack of Transportation (Medical): No   Lack of Transportation (Non-Medical): No  Physical Activity: Unknown   Days of Exercise per Week: 0 days   Minutes of Exercise per Session: Not on file  Stress: No Stress Concern Present   Feeling of Stress : Not at all  Social Connections: Unknown   Frequency of Communication with Friends and  Family: More than three times a week   Frequency of Social Gatherings with Friends and Family: Once a week   Attends Religious Services: 1 to 4 times per year   Active Member of [employer] or Organizations: Yes   Attends Music therapist: More than 4 times per year   Marital Status: Not on file  Intimate Partner Violence: Not At Risk   Fear of Current or Ex-Partner: No   Emotionally Abused: No   Physically Abused: No   Sexually Abused: No   Current Meds  Medication Sig   albuterol (VENTOLIN HFA) 108 (90 Base) MCG/ACT inhaler Inhale 2 puffs into the lungs every 6 (six) hours as needed for wheezing or shortness of breath.   aspirin EC 81 MG tablet Take 1 tablet (81 mg total) by mouth daily.   Azelastine HCl 0.15 % SOLN Place 2 sprays into both nostrils daily as needed (allergies).   Cyanocobalamin (VITAMIN B-12) 1000 MCG SUBL Place 0.001 tablets (1 mcg total) under the tongue daily.   cyclobenzaprine (FLEXERIL) 5 MG tablet Take 1-2 tablets (5-10 mg total) by mouth daily as needed for muscle spasms.   dapagliflozin propanediol (FARXIGA) 5 MG TABS tablet Take 1 tablet (5 mg total) by mouth daily before breakfast. DISCONTINUE METFORMIN   dicyclomine (BENTYL) 10 MG capsule Take 1 capsule (10 mg total) by mouth 3 (three) times daily before meals. As needed for abdominal pain   esomeprazole (NEXIUM) 40 MG capsule Take 1 capsule (40 mg total) by mouth daily.   Evolocumab (REPATHA SURECLICK) 944  MG/ML SOAJ INJECT THE CONTENTS OF 1 PEN INTO THE SKIN ONCE EVERY 14 DAYS   ezetimibe (ZETIA) 10 MG tablet Take 1 tablet (10 mg total) by mouth daily.   fluticasone (FLONASE) 50 MCG/ACT nasal spray Place 2 sprays into both nostrils daily as needed for allergies. Max dose 2 sprays each nostril   Fluticasone-Umeclidin-Vilant (TRELEGY ELLIPTA) 100-62.5-25 MCG/INH AEPB INHALE 1 PUFF ONCE DAILY   furosemide (LASIX) 20 MG tablet TAKE 1 TABLET BY MOUTH ONCE DAILY AND A SECOND TABLET IF NEEDED FOR FLUID BUILD  UP   hydrOXYzine (ATARAX/VISTARIL) 25 MG tablet TAKE 1 TO 2 TABLETS BY MOUTH ONCE DAILY AS NEEDED   ipratropium-albuterol (DUONEB) 0.5-2.5 (3) MG/3ML SOLN Take 3 mLs by nebulization 2 (two) times daily as needed (wheezing/shortness of breath).   isosorbide mononitrate (IMDUR) 30 MG 24 hr tablet Take 1 tablet (30 mg total) by mouth 2 (two) times daily.   Lancets (ONETOUCH DELICA PLUS CLEXNT70Y) MISC 1 Device by Does not apply route in the morning and at bedtime.   levocetirizine (XYZAL) 5 MG tablet Take 1 tablet (5 mg total) by mouth at bedtime as needed for allergies.   linaclotide (LINZESS) 72 MCG capsule TAKE 1 CAPSULE BY MOUTH ONCE DAILY BEFORE BREAKFAST   meclizine (ANTIVERT) 25 MG tablet TAKE 1 TABLET BY MOUTH TWICE DAILY AS NEEDED FOR DIZZINESS   metoprolol succinate (TOPROL-XL) 50 MG 24 hr tablet TAKE 1 TABLET BY MOUTH ONCE DAILY WITH OR IMMEDIATELY FOLLOWING A MEAL   mirabegron ER (MYRBETRIQ) 25 MG TB24 tablet Take 1 tablet (25 mg total) by mouth daily.   montelukast (SINGULAIR) 10 MG tablet Take 1 tablet (10 mg total) by mouth at bedtime.   mupirocin ointment (BACTROBAN) 2 % Apply 1 application topically 2 (two) times daily. Both nostrils (nose) x 5 days   NEEDLE, DISP, 25 G 25G X 1-1/2" MISC 1 Device by Does not apply route every 30 (thirty) days.   nitroGLYCERIN (NITROSTAT) 0.4 MG SL tablet Place 1 tablet (0.4 mg total) under the tongue every 5 (five) minutes as needed.   nystatin (MYCOSTATIN) 100000 UNIT/ML suspension Take 5 mLs (500,000 Units total) by mouth 4 (four) times daily.   olopatadine (PATANOL) 0.1 % ophthalmic solution Place 1 drop into both eyes 2 (two) times daily.   ondansetron (ZOFRAN) 4 MG tablet Take 1 tablet (4 mg total) by mouth every 8 (eight) hours as needed for nausea or vomiting.   ONETOUCH VERIO test strip USE AS DIRECTED   PARoxetine (PAXIL) 20 MG tablet Take 1 tablet (20 mg total) by mouth daily.   potassium chloride (KLOR-CON) 10 MEQ tablet Take 1 tablet (10  mEq total) by mouth daily.   pregabalin (LYRICA) 75 MG capsule Take 1 capsule (75 mg total) by mouth 2 (two) times daily.   rivaroxaban (XARELTO) 2.5 MG TABS tablet Take 1 tablet by mouth twice daily   rosuvastatin (CRESTOR) 20 MG tablet Take 1 tablet (20 mg total) by mouth at bedtime.   sodium chloride (OCEAN) 0.65 % SOLN nasal spray Place 2 sprays into both nostrils daily as needed for congestion.   traZODone (DESYREL) 50 MG tablet Take 0.5-1 tablets (25-50 mg total) by mouth at bedtime as needed for sleep.   Allergies  Allergen Reactions   Latex Rash        Shellfish Allergy Anaphylaxis    Hard shellfish/swelling in throat   Sulfonamide Derivatives Anaphylaxis    Swelling in throat.   Watermelon [Citrullus Vulgaris] Other (See Comments)  Throat itchy   Soy Allergy     Diarrhea/upset stomach     Tomato Itching    Throat itchy - also Raw carrots   Other Swelling   Recent Results (from the past 2160 hour(s))  Prolactin     Status: None   Collection Time: 04/16/21 10:03 AM  Result Value Ref Range   Prolactin 20.6 4.8 - 23.3 ng/mL  Insulin-Like Growth Factor     Status: None   Collection Time: 04/16/21 10:03 AM  Result Value Ref Range   Insulin-Like GF-1 64 57 - 202 ng/mL  Luteinizing Hormone     Status: None   Collection Time: 04/16/21 10:03 AM  Result Value Ref Range   LH 38.4 mIU/mL    Comment:                     Adult Female:                       Follicular phase      2.4 -  12.6                       Ovulation phase      14.0 -  95.6                       Luteal phase          1.0 -  11.4                       Postmenopausal        7.7 -  82.9   Follicle Stimulating Hormone     Status: None   Collection Time: 04/16/21 10:03 AM  Result Value Ref Range   FSH 71.1 mIU/mL    Comment:                     Adult Female:                       Follicular phase      3.5 -  12.5                       Ovulation phase       4.7 -  21.5                       Luteal phase           1.7 -   7.7                       Postmenopausal       25.8 - 134.8   Cortisol-am, blood     Status: None   Collection Time: 04/16/21 10:03 AM  Result Value Ref Range   Cortisol - AM 9.6 6.2 - 19.4 ug/dL  T4     Status: None   Collection Time: 04/16/21 10:03 AM  Result Value Ref Range   T4, Total 6.8 4.5 - 12.0 ug/dL  T4, Free     Status: None   Collection Time: 04/16/21 10:03 AM  Result Value Ref Range   Free T4 0.82 0.82 - 1.77 ng/dL  TSH     Status: None   Collection Time: 04/16/21 10:03 AM  Result Value Ref Range   TSH 1.520 0.450 - 4.500 uIU/mL  Cervicovaginal ancillary only( [place])     Status: None   Collection Time: 04/19/21  1:36 PM  Result Value Ref Range   Bacterial Vaginitis (gardnerella) Negative    Candida Vaginitis Negative    Candida Glabrata Negative    Comment      Normal Reference Range Bacterial Vaginosis - Negative   Comment Normal Reference Range Candida Species - Negative    Comment Normal Reference Range Candida Galbrata - Negative   Comprehensive metabolic panel     Status: Abnormal   Collection Time: 06/04/21  3:48 PM  Result Value Ref Range   Sodium 143 135 - 145 mEq/L   Potassium 3.9 3.5 - 5.1 mEq/L   Chloride 108 96 - 112 mEq/L   CO2 25 19 - 32 mEq/L   Glucose, Bld 75 70 - 99 mg/dL   BUN 16 6 - 23 mg/dL   Creatinine, Ser 1.36 (H) 0.40 - 1.20 mg/dL   Total Bilirubin 0.4 0.2 - 1.2 mg/dL   Alkaline Phosphatase 94 39 - 117 U/L   AST 17 0 - 37 U/L   ALT 17 0 - 35 U/L   Total Protein 7.2 6.0 - 8.3 g/dL   Albumin 4.5 3.5 - 5.2 g/dL   GFR 41.66 (L) >60.00 mL/min    Comment: Calculated using the CKD-EPI Creatinine Equation (2021)   Calcium 9.6 8.4 - 10.5 mg/dL  Hemoglobin A1c     Status: Abnormal   Collection Time: 06/04/21  3:48 PM  Result Value Ref Range   Hgb A1c MFr Bld 6.6 (H) 4.6 - 6.5 %    Comment: Glycemic Control Guidelines for People with Diabetes:Non Diabetic:  <6%Goal of Therapy: <7%Additional Action Suggested:  >8%    Vitamin D (25 hydroxy)     Status: None   Collection Time: 06/04/21  3:48 PM  Result Value Ref Range   VITD 49.99 30.00 - 100.00 ng/mL  Urinalysis, Routine w reflex microscopic     Status: Abnormal   Collection Time: 06/04/21  3:48 PM  Result Value Ref Range   Color, Urine YELLOW YELLOW   APPearance CLEAR CLEAR   Specific Gravity, Urine 1.011 1.001 - 1.035   pH 5.5 5.0 - 8.0   Glucose, UA 1+ (A) NEGATIVE   Bilirubin Urine NEGATIVE NEGATIVE   Ketones, ur NEGATIVE NEGATIVE   Hgb urine dipstick NEGATIVE NEGATIVE   Protein, ur NEGATIVE NEGATIVE   Nitrite NEGATIVE NEGATIVE   Leukocytes,Ua NEGATIVE NEGATIVE  Urine Culture     Status: None   Collection Time: 06/04/21  3:48 PM   Specimen: Urine  Result Value Ref Range   MICRO NUMBER: [id number]    SPECIMEN QUALITY: Adequate    Sample Source NOT GIVEN    STATUS: FINAL    ISOLATE 1:      Mixed genital flora isolated. These superficial bacteria are not indicative of a urinary tract infection. No further organism identification is warranted on this specimen. If clinically indicated, recollect clean-catch, mid-stream urine and transfer  immediately to Urine Culture Transport Tube.    Objective  Body mass index is 36.51 kg/m. Wt Readings from Last 3 Encounters:  06/04/21 219 lb 6.4 oz (99.5 kg)  04/19/21 220 lb 3.2 oz (99.9 kg)  04/16/21 222 lb 12.8 oz (101.1 kg)   Temp Readings from Last 3 Encounters:  06/04/21 (!) 97.5 F (36.4 C) (Oral)  04/19/21 98.1 F (36.7 C) (Oral)  03/14/21 98.2 F (36.8 C) (Oral)   BP Readings from Last 3 Encounters:  06/04/21 106/62  04/19/21 115/80  04/16/21 133/88   Pulse Readings from Last 3 Encounters:  06/04/21 69  04/19/21 72  04/16/21 71    Physical Exam Vitals and nursing note reviewed.  Constitutional:      Appearance: Normal appearance. She is well-developed and well-groomed.  HENT:     Head: Normocephalic and atraumatic.  Eyes:     Conjunctiva/sclera: Conjunctivae normal.      Pupils: Pupils are equal, round, and reactive to light.  Cardiovascular:     Rate and Rhythm: Normal rate and regular rhythm.     Heart sounds: Normal heart sounds. No murmur heard. Pulmonary:     Effort: Pulmonary effort is normal.     Breath sounds: Normal breath sounds.  Abdominal:     General: Abdomen is flat. Bowel sounds are normal.     Tenderness: There is no abdominal tenderness.  Musculoskeletal:     Right shoulder: Tenderness present. Decreased range of motion.  Skin:    General: Skin is warm and dry.  Neurological:     General: No focal deficit present.     Mental Status: She is alert and oriented to person, place, and time. Mental status is at baseline.     Cranial Nerves: Cranial nerves 2-12 are intact.     Gait: Gait is intact.  Psychiatric:        Attention and Perception: Attention and perception normal.        Mood and Affect: Mood and affect normal.        Speech: Speech normal.        Behavior: Behavior normal. Behavior is cooperative.        Thought Content: Thought content normal.        Cognition and Memory: Cognition and memory normal.        Judgment: Judgment normal.    Assessment  Plan  Bilateral bunions - Plan: Ambulatory referral to Podiatry Left foot pain - Plan: Ambulatory referral to Podiatry  Rotator cuff disorder, right - Plan: MR Shoulder Right Wo Contrast, Ambulatory referral to Physical Therapy [facility]  Bilateral sciatica - Plan: Ambulatory referral to Physical Therapy [facility]   Chronic midline low back pain with bilateral sciatica - Plan: Ambulatory referral to Physical Therapy  Urinary frequency - Plan: Urinalysis, Routine w reflex microscopic, Urine Culture  Vitamin D deficiency - Plan: Vitamin D (25 hydroxy)  Diabetes mellitus without complication ([place]) - Plan: Comprehensive metabolic panel, Hemoglobin A1c 6.6 controlled   B12 deficiency - Plan: Cyanocobalamin (VITAMIN B-12) 1000 MCG SUBL    HM Flu shot utd Consider shingrix in  future at pharmacy given Rx 01/10/21  pna 23 had 03/10/16 consider in future  covid 2/2  pfizer consider booster disc prev Tdap 05/05/2019 Consider prevnar in future   S/p hysterectomy no cervix and f/u OB/GYN h/o abnormal pap s/p cryo -pap 05/08/15 neg pap will  Repeat In 5 years   Colonoscopy Dr. Allen Norris 01/2017 polyps, gastritis, diverticulosis est Dr. Allen Norris f/u 01/27/2022  referred today est with Dr. Marius Ditch 03/20/20    Mammogram 10/13/2018 negative re ordered sch 09/01/19 left dx mammogram As of 3 or 10/28/19 utd benign cyst likely right breast rec [place] in 6 months to f/u ordered and sch 08/08/20 RECOMMENDATION: Bilateral diagnostic mammogram and RIGHT breast ultrasound in 3 months to ensure 1 year evaluation as per patient desire.   Mammogram 4//13/22 dx mammogram neg repeat in 1 year b/l diagnostic mammogram 1 year and right breast US due 11/06/21  Rec healthy diet and exercise      Provider: Dr. Olivia Mackie [name]-Internal Medicine

## 2021-06-05 LAB — VITAMIN D 25 HYDROXY (VIT D DEFICIENCY, FRACTURES): VITD: 49.99 ng/mL (ref 30.00–100.00)

## 2021-06-05 LAB — COMPREHENSIVE METABOLIC PANEL
ALT: 17 U/L (ref 0–35)
AST: 17 U/L (ref 0–37)
Albumin: 4.5 g/dL (ref 3.5–5.2)
Alkaline Phosphatase: 94 U/L (ref 39–117)
BUN: 16 mg/dL (ref 6–23)
CO2: 25 mEq/L (ref 19–32)
Calcium: 9.6 mg/dL (ref 8.4–10.5)
Chloride: 108 mEq/L (ref 96–112)
Creatinine, Ser: 1.36 mg/dL — ABNORMAL HIGH (ref 0.40–1.20)
GFR: 41.66 mL/min — ABNORMAL LOW (ref >60.00)
Glucose, Bld: 75 mg/dL (ref 70–99)
Potassium: 3.9 mEq/L (ref 3.5–5.1)
Sodium: 143 mEq/L (ref 135–145)
Total Bilirubin: 0.4 mg/dL (ref 0.2–1.2)
Total Protein: 7.2 g/dL (ref 6.0–8.3)

## 2021-06-05 LAB — URINALYSIS, ROUTINE W REFLEX MICROSCOPIC
Bilirubin Urine: NEGATIVE
Hgb urine dipstick: NEGATIVE
Ketones, ur: NEGATIVE
Leukocytes,Ua: NEGATIVE
Nitrite: NEGATIVE
Protein, ur: NEGATIVE
Specific Gravity, Urine: 1.011 (ref 1.001–1.035)
pH: 5.5 (ref 5.0–8.0)

## 2021-06-05 LAB — URINE CULTURE
MICRO NUMBER:: 12609060
SPECIMEN QUALITY:: ADEQUATE

## 2021-06-05 LAB — HEMOGLOBIN A1C: Hgb A1c MFr Bld: 6.6 % — ABNORMAL HIGH (ref 4.6–6.5)

## 2021-06-10 NOTE — Progress Notes (Signed)
A1C 6.6  Kidney function sl elevated  -make sure drinking water 55-64 ounces  -avoid nsaids  -arianna order bmet in 2 weeks repeat here  Vitamin D normal  Urine normal culture no UTI  Insurance denied MRI right shoulder until 6 weeks of PT  She can f/u ortho and maybe they can help in the meantime

## 2021-06-12 ENCOUNTER — Encounter: Payer: Self-pay | Admitting: Podiatry

## 2021-06-12 ENCOUNTER — Ambulatory Visit (INDEPENDENT_AMBULATORY_CARE_PROVIDER_SITE_OTHER): Payer: Medicare HMO | Admitting: Podiatry

## 2021-06-12 ENCOUNTER — Other Ambulatory Visit: Payer: Self-pay

## 2021-06-12 ENCOUNTER — Ambulatory Visit (INDEPENDENT_AMBULATORY_CARE_PROVIDER_SITE_OTHER): Payer: Medicare HMO

## 2021-06-12 ENCOUNTER — Other Ambulatory Visit: Payer: Self-pay | Admitting: Podiatry

## 2021-06-12 DIAGNOSIS — M2011 Hallux valgus (acquired), right foot: Secondary | ICD-10-CM

## 2021-06-12 DIAGNOSIS — M2012 Hallux valgus (acquired), left foot: Secondary | ICD-10-CM

## 2021-06-12 DIAGNOSIS — G5782 Other specified mononeuropathies of left lower limb: Secondary | ICD-10-CM | POA: Diagnosis not present

## 2021-06-12 DIAGNOSIS — G5792 Unspecified mononeuropathy of left lower limb: Secondary | ICD-10-CM

## 2021-06-12 MED ORDER — DEXAMETHASONE SODIUM PHOSPHATE 120 MG/30ML IJ SOLN
4.0000 mg | Freq: Once | INTRAMUSCULAR | Status: AC
  Administered 2021-06-12: 4 mg via INTRA_ARTICULAR

## 2021-06-12 NOTE — Progress Notes (Signed)
She presents today complaining of pain to the forefoot left.  States that her bunion deformity is really starting to affect her ability to perform her daily activities she started to feel pain beneath the second and third metatarsophalangeal joints with radiating pains to the toes and up to foot.  Objective: Vital signs stable alert oriented x3.  Pulses are palpable neurologic Sorum is intact Deetjen reflex are intact muscle strength normal symmetrical bilateral.  Orthopedic evaluation demonstrates pes planovalgus with moderate to severe hallux abductovalgus deformity she also has pain on palpation to the second and third interdigital spaces left foot greater than the right.  No open lesions or wounds are noted.  Radiographs taken do not demonstrate any type of osseous abnormalities other than pes planus and hallux valgus.  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  Hallux valgus left foot.  Plan: Discussed etiology pathology and surgical therapies at this point I feel that at some point in the future she needs to have this bunion fixed.  However more urgently today she does have the small neuromas.  We will inject him today with 2 mg of dexamethasone local anesthetic and I will follow-up with her on an as-needed basis.

## 2021-06-17 DIAGNOSIS — G4710 Hypersomnia, unspecified: Secondary | ICD-10-CM | POA: Diagnosis not present

## 2021-06-17 DIAGNOSIS — I259 Chronic ischemic heart disease, unspecified: Secondary | ICD-10-CM | POA: Diagnosis not present

## 2021-06-17 DIAGNOSIS — G4733 Obstructive sleep apnea (adult) (pediatric): Secondary | ICD-10-CM | POA: Diagnosis not present

## 2021-06-24 ENCOUNTER — Telehealth: Payer: Self-pay | Admitting: Internal Medicine

## 2021-06-24 ENCOUNTER — Telehealth (INDEPENDENT_AMBULATORY_CARE_PROVIDER_SITE_OTHER): Payer: Medicare HMO | Admitting: Family Medicine

## 2021-06-24 ENCOUNTER — Encounter: Payer: Self-pay | Admitting: Family Medicine

## 2021-06-24 VITALS — Temp 99.0°F

## 2021-06-24 DIAGNOSIS — J449 Chronic obstructive pulmonary disease, unspecified: Secondary | ICD-10-CM | POA: Diagnosis not present

## 2021-06-24 DIAGNOSIS — J069 Acute upper respiratory infection, unspecified: Secondary | ICD-10-CM

## 2021-06-24 MED ORDER — AMOXICILLIN-POT CLAVULANATE 500-125 MG PO TABS: 1.0000 | ORAL_TABLET | Freq: Two times a day (BID) | ORAL | 0 refills | Status: AC

## 2021-06-24 MED ORDER — BENZONATATE 100 MG PO CAPS: 100.0000 mg | ORAL_CAPSULE | Freq: Two times a day (BID) | ORAL | 0 refills | Status: DC | PRN

## 2021-06-24 MED ORDER — PREDNISONE 10 MG PO TABS: ORAL_TABLET | ORAL | 0 refills | Status: DC

## 2021-06-24 NOTE — Telephone Encounter (Signed)
Patient scheduled for a virtual visit with Grier Mitts today at 10:00 am.

## 2021-06-24 NOTE — Progress Notes (Signed)
Virtual Visit via Video Note  I connected with [name] on 06/24/21 at 10:00 AM EST by a video enabled telemedicine application 2/2 [id number] pandemic and verified that I am speaking with the correct person using two identifiers.  Location patient: home Location provider:work or home office Persons participating in the virtual visit: patient, provider  I discussed the limitations of evaluation and management by telemedicine and the availability of in person appointments. The patient expressed understanding and agreed to proceed.  Chief Complaint  Patient presents with   Cough    Wet cough, itchy throat and ears, chest congestion started yesterday. Had to sit up in bed because could not sleep laying dawn, has only taken tylenol PM other than meds prescribed. COVID test was neg, nephew was taken to ED by ambulance and has flu.      HPI:  Pt is a 60 yo female with pmh sig for HTN, combined CHF, h/o MI s/p CABG x 4, PAD, OSA, COPD, asthma, IBS mixed, fatty liver, left adrenal adenoma, pituitary macroadenoma, DM 2, OFV, OAB, with frontal pressure and cough yesterday.  Yesterday evening had difficulty coughing up phlegm.  HAs itchy ears, intermittent nasal congestion.  Throat irritation with coughing.  Pt denies fever, rhinorrhea, n/v.  Pt states cough was so bad last night it kept her up.  Pt has not used her albuterol inhaler or nebulizer.  Pt's nephew was sick.  His COVID test was negative.  Pt also mentions her sister who lives in the house was sick.  EMT had to get her this am due to "fluid around heart".  ROS: See pertinent positives and negatives per HPI.  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 being followed by neurosurgery/eye doctor and pcp. monitoring size.causes sinus problems   Bronchitis     Cardiac arrhythmia    Cervical neck pain with evidence of disc disease    C5/6 disease, MRI done late 2012 - no records available   Chronic combined systolic and diastolic CHF (congestive heart failure) ([place])    a. 08/2010 Echo: mildly reduced EF 40-45%, mild diffuse hypokinesis; b. 06/2016 Echo: EF 50%, no rwma, mild to mod TR; c. 03/2017 Echo: EF 40-45%, Gr1 DD (prior echo reviewed and EF felt to be lower than reported).   Chronic pain    Cocaine abuse, in remission (HCC)    clean x 24 years   Colon polyps    COPD (chronic obstructive pulmonary disease) ([place] [place])    a. 12/2018 PFT: No obvious obst/restrictive dzs.   Coronary artery disease    a. PCI of LCX 2003; b. PCI of the LAD 2012 with a (2.5 x 8 mm BMS);  c.s/p CABG 4/12: L-LAD, VG-Dx, VG-OM, VG-RCA (Dr. Prescott Gum);  d. 01/2012 MV: inf infarct, attenuation, no ischemia; e. 02/2017 MV: signifi attenuation artifact. Fixed basal antlat/inflat scar vs artifact. Reversible apical lat and mid antlat defect - ? atten vs ischemia. F/u echo w/o wma->Med rx.   Depression    Diabetes mellitus without complication ([place])    Family history of colon cancer    Generalized headaches    frequent   GERD (gastroesophageal reflux disease)    Headache    Heart murmur    Hematochezia  Hepatitis    history of hepatitis b   History of cervical cancer    s/p cryotherapy   History of drug abuse ([place])    cocaine, marijuana, clean since 1989   History of hepatitis B    from eating undercooked liver   History of MI (myocardial infarction)    Hyperlipidemia    Hypertension    Ischemic cardiomyopathy    a. 03/2017 Echo: EF 40-45%, Gr1 DD.   Myocardial infarction ([place]) 2003, 2012   PAD (peripheral artery disease) ([place])    a. s/p Right SFA atherectomy and PTA 01/15/11; b. 07/2018 ABI: R 1.02, L 1.09.   Pituitary mass ([place])    a. 12/2018 MRI Brain: Stable pituitary mass w/ 69mm area of necrosis. Mass abuts R cavernous sinus w/o definite invasion.   Polyp of colon     Seasonal allergies    Sleep apnea    uses cpap   Smoking history    quit 07/2010   Thyroid disease    Urinary incontinence     Past Surgical History:  Procedure Laterality Date   ABDOMINAL HYSTERECTOMY     2003   CHOLECYSTECTOMY  1986   COLONOSCOPY  2008   3 polyps   COLONOSCOPY WITH PROPOFOL N/A 02/06/2015   Procedure: COLONOSCOPY WITH PROPOFOL;  Surgeon: Lucilla Lame, MD;  Location: [facility] ENDOSCOPY;  Service: Endoscopy;  Laterality: N/A;   COLONOSCOPY WITH PROPOFOL N/A 01/27/2017   Procedure: COLONOSCOPY WITH PROPOFOL;  Surgeon: Lucilla Lame, MD;  Location: [facility] ENDOSCOPY;  Service: Endoscopy;  Laterality: N/A;   CORONARY ANGIOPLASTY     w/ stent placement x2   CORONARY ARTERY BYPASS GRAFT  2012   Dr [name]   CORONARY STENT PLACEMENT  2003   S/P MI   CORONARY STENT PLACEMENT  2007   [place]   ESOPHAGOGASTRODUODENOSCOPY (EGD) WITH PROPOFOL N/A 02/06/2015   Procedure: ESOPHAGOGASTRODUODENOSCOPY (EGD) WITH PROPOFOL;  Surgeon: Lucilla Lame, MD;  Location: [facility] ENDOSCOPY;  Service: Endoscopy;  Laterality: N/A;   ESOPHAGOGASTRODUODENOSCOPY (EGD) WITH PROPOFOL N/A 01/27/2017   Procedure: ESOPHAGOGASTRODUODENOSCOPY (EGD) WITH PROPOFOL;  Surgeon: Lucilla Lame, MD;  Location: [facility] ENDOSCOPY;  Service: Endoscopy;  Laterality: N/A;   ESOPHAGOGASTRODUODENOSCOPY (EGD) WITH PROPOFOL N/A 09/01/2019   Procedure: ESOPHAGOGASTRODUODENOSCOPY (EGD) WITH PROPOFOL;  Surgeon: Lin Landsman, MD;  Location: [place];  Service: Gastroenterology;  Laterality: N/A;   FEMORAL ARTERY STENT  10/2010   right sided (Dr. Burt Knack)   KNEE ARTHROSCOPY WITH MEDIAL MENISECTOMY Right 08/20/2017   Procedure: KNEE ARTHROSCOPY WITH MEDIAL  AND LATERAL MENISECTOMY;  Surgeon: Hessie Knows, MD;  Location: [facility] ORS;  Service: Orthopedics;  Laterality: Right;   POSTERIOR CERVICAL LAMINECTOMY N/A 03/08/2018   Procedure: POSTERIOR CERVICAL LAMINECTOMY-C7;  Surgeon: Deetta Perla, MD;  Location: [facility] ORS;  Service:  Neurosurgery;  Laterality: N/A;   TONSILLECTOMY     TOTAL KNEE ARTHROPLASTY Right 04/14/2019   Procedure: RIGHT TOTAL KNEE ARTHROPLASTY;  Surgeon: Hessie Knows, MD;  Location: [facility] ORS;  Service: Orthopedics;  Laterality: Right;   TUBAL LIGATION      Family History  Problem Relation Age of Onset   Hypertension Father    Heart failure Father    Diabetes Father    Colon cancer Father 55   Glaucoma Father    Cancer Father        colorectal    Heart disease Father    Other Father        glaucoma   Breast cancer Mother 72  breast cancer, late 52's   Cancer Mother        breast   Brain cancer Sister    Arthritis Sister    Diabetes Sister    Hypertension Sister    Kidney disease Sister    Diabetes Brother    Hypertension Brother    Other Sister        brain tumor    Acute myelogenous leukemia Grandson        06/2019   Coronary artery disease Neg Hx    Stroke Neg Hx     Current Outpatient Medications:    albuterol (VENTOLIN HFA) 108 (90 Base) MCG/ACT inhaler, Inhale 2 puffs into the lungs every 6 (six) hours as needed for wheezing or shortness of breath., Disp: 1 g, Rfl: 12   aspirin EC 81 MG tablet, Take 1 tablet (81 mg total) by mouth daily., Disp: 90 tablet, Rfl: 3   Azelastine HCl 0.15 % SOLN, Place 2 sprays into both nostrils daily as needed (allergies)., Disp: 30 mL, Rfl: 11   Cyanocobalamin (VITAMIN B-12) 1000 MCG SUBL, Place 0.001 tablets (1 mcg total) under the tongue daily., Disp: 90 tablet, Rfl: 3   cyclobenzaprine (FLEXERIL) 5 MG tablet, Take 1-2 tablets (5-10 mg total) by mouth daily as needed for muscle spasms., Disp: 60 tablet, Rfl: 11   dapagliflozin propanediol (FARXIGA) 5 MG TABS tablet, Take 1 tablet (5 mg total) by mouth daily before breakfast. DISCONTINUE METFORMIN, Disp: 90 tablet, Rfl: 1   dicyclomine (BENTYL) 10 MG capsule, Take 1 capsule (10 mg total) by mouth 3 (three) times daily before meals. As needed for abdominal pain, Disp: 120 capsule, Rfl:  11   esomeprazole (NEXIUM) 40 MG capsule, Take 1 capsule (40 mg total) by mouth daily., Disp: 90 capsule, Rfl: 3   Evolocumab (REPATHA SURECLICK) 403 MG/ML SOAJ, INJECT THE CONTENTS OF 1 PEN INTO THE SKIN ONCE EVERY 14 DAYS, Disp: 6 mL, Rfl: 0   ezetimibe (ZETIA) 10 MG tablet, Take 1 tablet (10 mg total) by mouth daily., Disp: 90 tablet, Rfl: 3   fluticasone (FLONASE) 50 MCG/ACT nasal spray, Place 2 sprays into both nostrils daily as needed for allergies. Max dose 2 sprays each nostril, Disp: 48 g, Rfl: 3   Fluticasone-Umeclidin-Vilant (TRELEGY ELLIPTA) 100-62.5-25 MCG/INH AEPB, INHALE 1 PUFF ONCE DAILY, Disp: 60 each, Rfl: 11   furosemide (LASIX) 20 MG tablet, TAKE 1 TABLET BY MOUTH ONCE DAILY AND A SECOND TABLET IF NEEDED FOR FLUID BUILD UP, Disp: 180 tablet, Rfl: 0   hydrOXYzine (ATARAX/VISTARIL) 25 MG tablet, TAKE 1 TO 2 TABLETS BY MOUTH ONCE DAILY AS NEEDED, Disp: 60 tablet, Rfl: 11   ipratropium-albuterol (DUONEB) 0.5-2.5 (3) MG/3ML SOLN, Take 3 mLs by nebulization 2 (two) times daily as needed (wheezing/shortness of breath)., Disp: 360 mL, Rfl: 11   isosorbide mononitrate (IMDUR) 30 MG 24 hr tablet, Take 1 tablet (30 mg total) by mouth 2 (two) times daily., Disp: 180 tablet, Rfl: 3   Lancets (ONETOUCH DELICA PLUS [id number]) MISC, 1 Device by Does not apply route in the morning and at bedtime., Disp: 180 each, Rfl: 3   levocetirizine (XYZAL) 5 MG tablet, Take 1 tablet (5 mg total) by mouth at bedtime as needed for allergies., Disp: 90 tablet, Rfl: 3   linaclotide (LINZESS) 72 MCG capsule, TAKE 1 CAPSULE BY MOUTH ONCE DAILY BEFORE BREAKFAST, Disp: 90 capsule, Rfl: 3   meclizine (ANTIVERT) 25 MG tablet, TAKE 1 TABLET BY MOUTH TWICE DAILY AS NEEDED FOR DIZZINESS, Disp:  60 tablet, Rfl: 11   metoprolol succinate (TOPROL-XL) 50 MG 24 hr tablet, TAKE 1 TABLET BY MOUTH ONCE DAILY WITH OR IMMEDIATELY FOLLOWING A MEAL, Disp: 90 tablet, Rfl: 3   mirabegron ER (MYRBETRIQ) 25 MG TB24 tablet, Take 1 tablet  (25 mg total) by mouth daily., Disp: 90 tablet, Rfl: 3   montelukast (SINGULAIR) 10 MG tablet, Take 1 tablet (10 mg total) by mouth at bedtime., Disp: 90 tablet, Rfl: 3   mupirocin ointment (BACTROBAN) 2 %, Apply 1 application topically 2 (two) times daily. Both nostrils (nose) x 5 days, Disp: 30 g, Rfl: 0   NEEDLE, DISP, 25 G 25G X 1-1/2" MISC, 1 Device by Does not apply route every 30 (thirty) days., Disp: 4 each, Rfl: 3   nitroGLYCERIN (NITROSTAT) 0.4 MG SL tablet, Place 1 tablet (0.4 mg total) under the tongue every 5 (five) minutes as needed., Disp: 25 tablet, Rfl: 4   nystatin (MYCOSTATIN) 100000 UNIT/ML suspension, Take 5 mLs (500,000 Units total) by mouth 4 (four) times daily., Disp: 240 mL, Rfl: 0   olopatadine (PATANOL) 0.1 % ophthalmic solution, Place 1 drop into both eyes 2 (two) times daily., Disp: 15 mL, Rfl: 3   ondansetron (ZOFRAN) 4 MG tablet, Take 1 tablet (4 mg total) by mouth every 8 (eight) hours as needed for nausea or vomiting., Disp: 20 tablet, Rfl: 5   ONETOUCH VERIO test strip, USE AS DIRECTED, Disp: 200 each, Rfl: 3   PARoxetine (PAXIL) 20 MG tablet, Take 1 tablet (20 mg total) by mouth daily., Disp: 90 tablet, Rfl: 3   potassium chloride (KLOR-CON) 10 MEQ tablet, Take 1 tablet (10 mEq total) by mouth daily., Disp: 90 tablet, Rfl: 3   pregabalin (LYRICA) 75 MG capsule, Take 1 capsule (75 mg total) by mouth 2 (two) times daily., Disp: 60 capsule, Rfl: 5   rivaroxaban (XARELTO) 2.5 MG TABS tablet, Take 1 tablet by mouth twice daily, Disp: 180 tablet, Rfl: 1   rosuvastatin (CRESTOR) 20 MG tablet, Take 1 tablet (20 mg total) by mouth at bedtime., Disp: 90 tablet, Rfl: 3   sodium chloride (OCEAN) 0.65 % SOLN nasal spray, Place 2 sprays into both nostrils daily as needed for congestion., Disp: 30 mL, Rfl: 11   traZODone (DESYREL) 50 MG tablet, Take 0.5-1 tablets (25-50 mg total) by mouth at bedtime as needed for sleep., Disp: 90 tablet, Rfl: 3   losartan (COZAAR) 25 MG tablet,  Take 0.5 tablets (12.5 mg total) by mouth daily., Disp: 45 tablet, Rfl: 3  EXAM:  VITALS per patient if applicable: RR between 76-16 bpm  GENERAL: alert, oriented, appears well and in no acute distress  HEENT: atraumatic, conjunctiva clear, no obvious abnormalities on inspection of external nose and ears  NECK: normal movements of the head and neck  LUNGS: wet cough, on inspection no signs of respiratory distress, breathing rate appears normal, no obvious gross SOB, gasping or wheezing  CV: no obvious cyanosis  MS: moves all visible extremities without noticeable abnormality  PSYCH/NEURO: pleasant and cooperative, no obvious depression or anxiety, speech and thought processing grossly intact  ASSESSMENT AND PLAN:  Discussed the following assessment and plan:  Viral URI with cough  -Discussed possible causes including influenza, acute nasopharyngitis, COVID -Patient advised to take at home COVID test -Supportive care -Given strict precautions - Plan: benzonatate (TESSALON) 100 MG capsule  COPD with chronic bronchitis and emphysema (HCC)  -Given history of COPD given wait-and-see prescription for COPD exacerbation.  Advised to use current  inhalers including albuterol or nebulizer prior to picking up prescription -Given precautions - Plan: amoxicillin-clavulanate (AUGMENTIN) 500-125 MG tablet, predniSONE (DELTASONE) 10 MG tablet  Follow-up with PCP as needed   I discussed the assessment and treatment plan with the patient. The patient was provided an opportunity to ask questions and all were answered. The patient agreed with the plan and demonstrated an understanding of the instructions.   The patient was advised to call back or seek an in-person evaluation if the symptoms worsen or if the condition fails to improve as anticipated.  Billie Ruddy, MD

## 2021-06-24 NOTE — Telephone Encounter (Signed)
Patient called and has a wet cough, no fever, ears and throat itchy, Chest congestion. No available appointments today. Patient was transferred to Access Nurse.

## 2021-06-25 NOTE — Telephone Encounter (Signed)
For your information  Patient was seen by Dr Volanda Napoleon and treated with the following:   Amoxicillin-Pot Clavulanate 500-125 MG 500 mg Oral 2 times daily  Benzonatate 100 mg Oral 2 times daily PRN  predniSONE 10 MG Take 5 tabs on day 1, 4 tabs on day 2, 3 tabs on day 3, 2 tabs on day 4, 1 tab on day 5.  Note also available in the chart.

## 2021-06-25 NOTE — Telephone Encounter (Signed)
The patient called to inform the provider that she and her sister were negative for Covid . However, she has COPD.

## 2021-07-01 DIAGNOSIS — M25511 Pain in right shoulder: Secondary | ICD-10-CM | POA: Diagnosis not present

## 2021-07-01 DIAGNOSIS — M6281 Muscle weakness (generalized): Secondary | ICD-10-CM | POA: Diagnosis not present

## 2021-07-01 DIAGNOSIS — M542 Cervicalgia: Secondary | ICD-10-CM | POA: Diagnosis not present

## 2021-07-11 DIAGNOSIS — M6281 Muscle weakness (generalized): Secondary | ICD-10-CM | POA: Diagnosis not present

## 2021-07-11 DIAGNOSIS — M542 Cervicalgia: Secondary | ICD-10-CM | POA: Diagnosis not present

## 2021-07-11 DIAGNOSIS — M25511 Pain in right shoulder: Secondary | ICD-10-CM | POA: Diagnosis not present

## 2021-07-15 ENCOUNTER — Other Ambulatory Visit: Payer: Self-pay | Admitting: Internal Medicine

## 2021-07-15 DIAGNOSIS — I2510 Atherosclerotic heart disease of native coronary artery without angina pectoris: Secondary | ICD-10-CM

## 2021-07-16 ENCOUNTER — Ambulatory Visit: Payer: Medicare HMO | Admitting: Internal Medicine

## 2021-07-16 DIAGNOSIS — M25511 Pain in right shoulder: Secondary | ICD-10-CM | POA: Diagnosis not present

## 2021-07-16 DIAGNOSIS — M6281 Muscle weakness (generalized): Secondary | ICD-10-CM | POA: Diagnosis not present

## 2021-07-16 DIAGNOSIS — M542 Cervicalgia: Secondary | ICD-10-CM | POA: Diagnosis not present

## 2021-07-17 DIAGNOSIS — G4710 Hypersomnia, unspecified: Secondary | ICD-10-CM | POA: Diagnosis not present

## 2021-07-17 DIAGNOSIS — G4733 Obstructive sleep apnea (adult) (pediatric): Secondary | ICD-10-CM | POA: Diagnosis not present

## 2021-07-17 DIAGNOSIS — I259 Chronic ischemic heart disease, unspecified: Secondary | ICD-10-CM | POA: Diagnosis not present

## 2021-07-24 ENCOUNTER — Other Ambulatory Visit: Payer: Self-pay

## 2021-07-24 ENCOUNTER — Ambulatory Visit (INDEPENDENT_AMBULATORY_CARE_PROVIDER_SITE_OTHER): Payer: Medicare HMO

## 2021-07-24 ENCOUNTER — Encounter: Payer: Self-pay | Admitting: Internal Medicine

## 2021-07-24 ENCOUNTER — Ambulatory Visit (INDEPENDENT_AMBULATORY_CARE_PROVIDER_SITE_OTHER): Payer: Medicare HMO | Admitting: Internal Medicine

## 2021-07-24 VITALS — BP 118/80 | HR 72 | Temp 97.1°F | Ht 65.0 in | Wt 218.6 lb

## 2021-07-24 DIAGNOSIS — J441 Chronic obstructive pulmonary disease with (acute) exacerbation: Secondary | ICD-10-CM

## 2021-07-24 DIAGNOSIS — J324 Chronic pansinusitis: Secondary | ICD-10-CM | POA: Diagnosis not present

## 2021-07-24 DIAGNOSIS — R0982 Postnasal drip: Secondary | ICD-10-CM

## 2021-07-24 DIAGNOSIS — R7989 Other specified abnormal findings of blood chemistry: Secondary | ICD-10-CM

## 2021-07-24 DIAGNOSIS — R0989 Other specified symptoms and signs involving the circulatory and respiratory systems: Secondary | ICD-10-CM | POA: Diagnosis not present

## 2021-07-24 DIAGNOSIS — N182 Chronic kidney disease, stage 2 (mild): Secondary | ICD-10-CM

## 2021-07-24 DIAGNOSIS — J069 Acute upper respiratory infection, unspecified: Secondary | ICD-10-CM | POA: Diagnosis not present

## 2021-07-24 DIAGNOSIS — Z780 Asymptomatic menopausal state: Secondary | ICD-10-CM

## 2021-07-24 DIAGNOSIS — G8929 Other chronic pain: Secondary | ICD-10-CM

## 2021-07-24 DIAGNOSIS — J439 Emphysema, unspecified: Secondary | ICD-10-CM | POA: Diagnosis not present

## 2021-07-24 DIAGNOSIS — J449 Chronic obstructive pulmonary disease, unspecified: Secondary | ICD-10-CM | POA: Diagnosis not present

## 2021-07-24 DIAGNOSIS — N3281 Overactive bladder: Secondary | ICD-10-CM | POA: Diagnosis not present

## 2021-07-24 DIAGNOSIS — R1030 Lower abdominal pain, unspecified: Secondary | ICD-10-CM

## 2021-07-24 DIAGNOSIS — M25512 Pain in left shoulder: Secondary | ICD-10-CM

## 2021-07-24 DIAGNOSIS — M25511 Pain in right shoulder: Secondary | ICD-10-CM

## 2021-07-24 DIAGNOSIS — R059 Cough, unspecified: Secondary | ICD-10-CM | POA: Diagnosis not present

## 2021-07-24 HISTORY — DX: Other chronic pain: G89.29

## 2021-07-24 HISTORY — DX: Postnasal drip: R09.82

## 2021-07-24 LAB — BASIC METABOLIC PANEL
BUN: 15 mg/dL (ref 6–23)
CO2: 26 mEq/L (ref 19–32)
Calcium: 9.2 mg/dL (ref 8.4–10.5)
Chloride: 105 mEq/L (ref 96–112)
Creatinine, Ser: 0.98 mg/dL (ref 0.40–1.20)
GFR: 61.67 mL/min (ref >60.00)
Glucose, Bld: 97 mg/dL (ref 70–99)
Potassium: 3.4 mEq/L — ABNORMAL LOW (ref 3.5–5.1)
Sodium: 138 mEq/L (ref 135–145)

## 2021-07-24 LAB — MICROALBUMIN / CREATININE URINE RATIO
Creatinine,U: 175 mg/dL
Microalb Creat Ratio: 0.4 mg/g (ref 0.0–30.0)
Microalb, Ur: <0.7 mg/dL (ref 0.0–1.9)

## 2021-07-24 MED ORDER — AZITHROMYCIN 250 MG PO TABS: ORAL_TABLET | ORAL | 0 refills | Status: AC

## 2021-07-24 MED ORDER — SALINE SPRAY 0.65 % NA SOLN: 2.0000 | Freq: Every day | NASAL | 11 refills | Status: DC | PRN

## 2021-07-24 MED ORDER — BENZONATATE 100 MG PO CAPS: 100.0000 mg | ORAL_CAPSULE | Freq: Three times a day (TID) | ORAL | 0 refills | Status: DC | PRN

## 2021-07-24 MED ORDER — GEMTESA 75 MG PO TABS: 75.0000 mg | ORAL_TABLET | Freq: Every day | ORAL | 3 refills | Status: DC

## 2021-07-24 MED ORDER — IPRATROPIUM BROMIDE 0.06 % NA SOLN: 2.0000 | Freq: Four times a day (QID) | NASAL | 12 refills | Status: DC

## 2021-07-24 MED ORDER — PREDNISONE 20 MG PO TABS: 40.0000 mg | ORAL_TABLET | Freq: Every day | ORAL | 0 refills | Status: DC

## 2021-07-24 NOTE — Progress Notes (Addendum)
Chief Complaint  Patient presents with   Follow-up   F/u  1. Right shoulder pain 10/10 and pain with rom and feels like out of place and left shoulder pain and chronic pelvic discomfort  2. Pnd with nose bleeding chest congestion cough with yellow to clear thick phelgm using trelegy but needs new neb machine and wants refill of tessalon perles 3. Elevated creatinine today  4. Overactive bladder myrbetriq 25 mg not helping and having to wear depends   Review of Systems  Constitutional:  Negative for weight loss.  HENT:  Negative for hearing loss.   Eyes:  Negative for blurred vision.  Respiratory:  Negative for shortness of breath.   Cardiovascular:  Negative for chest pain.  Gastrointestinal:  Negative for abdominal pain and blood in stool.  Genitourinary:  Positive for frequency. Negative for dysuria.  Musculoskeletal:  Positive for joint pain. Negative for falls.  Skin:  Negative for rash.  Neurological:  Negative for headaches.  Psychiatric/Behavioral:  Negative for depression.   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 being followed by neurosurgery/eye doctor and pcp. monitoring size.causes sinus problems   Bronchitis    Cardiac arrhythmia    Cervical neck pain with evidence of disc disease    C5/6 disease, MRI done late 2012 - no records available   Chronic combined systolic and diastolic CHF (congestive heart failure) ([place])    a. 08/2010 Echo: mildly reduced EF 40-45%, mild diffuse hypokinesis; b. 06/2016 Echo: EF 50%, no rwma, mild to mod TR; c. 03/2017 Echo: EF 40-45%, Gr1 DD (prior echo reviewed and EF felt to be lower than reported).   Chronic pain    Cocaine abuse, in remission (HCC)    clean x 24 years   Colon polyps    COPD (chronic obstructive pulmonary disease) ([place])    a. 12/2018  PFT: No obvious obst/restrictive dzs.   Coronary artery disease    a. PCI of LCX 2003; b. PCI of the LAD 2012 with a (2.5 x 8 mm BMS);  c.s/p CABG 4/12: L-LAD, VG-Dx, VG-OM, VG-RCA (Dr. Prescott Gum);  d. 01/2012 MV: inf infarct, attenuation, no ischemia; e. 02/2017 MV: signifi attenuation artifact. Fixed basal antlat/inflat scar vs artifact. Reversible apical lat and mid antlat defect - ? atten vs ischemia. F/u echo w/o wma->Med rx.   Depression    Diabetes mellitus without complication ([place])    Family history of colon cancer    Generalized headaches    frequent   GERD (gastroesophageal reflux disease)    Headache    Heart murmur    Hematochezia    Hepatitis    history of hepatitis b   History of cervical cancer    s/p cryotherapy   History of drug abuse ([place])    cocaine, marijuana, clean since 1989   History of hepatitis B    from eating undercooked liver   History of MI (myocardial infarction)    Hyperlipidemia    Hypertension    Ischemic cardiomyopathy    a. 03/2017 Echo: EF 40-45%, Gr1 DD.   Myocardial infarction [facility]) 2003, 2012   PAD (peripheral artery disease) ([place])    a. s/p Right SFA atherectomy and PTA 01/15/11; b. 07/2018 ABI: R 1.02,  L 1.09.   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 Mass abuts R cavernous sinus w/o definite invasion.   Polyp of colon    Seasonal allergies    Sleep apnea    uses cpap   Smoking history    quit 07/2010   Thyroid disease    Urinary incontinence    Past Surgical History:  Procedure Laterality Date   ABDOMINAL HYSTERECTOMY     2003   CHOLECYSTECTOMY  1986   COLONOSCOPY  2008   3 polyps   COLONOSCOPY WITH PROPOFOL N/A 02/06/2015   Procedure: COLONOSCOPY WITH PROPOFOL;  Surgeon: Lucilla Lame, MD;  Location: [facility] ENDOSCOPY;  Service: Endoscopy;  Laterality: N/A;   COLONOSCOPY WITH PROPOFOL N/A 01/27/2017   Procedure: COLONOSCOPY WITH PROPOFOL;  Surgeon: Lucilla Lame, MD;  Location: [facility]  ENDOSCOPY;  Service: Endoscopy;  Laterality: N/A;   CORONARY ANGIOPLASTY     w/ stent placement x2   CORONARY ARTERY BYPASS GRAFT  2012   Dr [name]   CORONARY STENT PLACEMENT  2003   S/P MI   CORONARY STENT PLACEMENT  2007   [place]   ESOPHAGOGASTRODUODENOSCOPY (EGD) WITH PROPOFOL N/A 02/06/2015   Procedure: ESOPHAGOGASTRODUODENOSCOPY (EGD) WITH PROPOFOL;  Surgeon: Lucilla Lame, MD;  Location: [facility] ENDOSCOPY;  Service: Endoscopy;  Laterality: N/A;   ESOPHAGOGASTRODUODENOSCOPY (EGD) WITH PROPOFOL N/A 01/27/2017   Procedure: ESOPHAGOGASTRODUODENOSCOPY (EGD) WITH PROPOFOL;  Surgeon: Lucilla Lame, MD;  Location: [facility] ENDOSCOPY;  Service: Endoscopy;  Laterality: N/A;   ESOPHAGOGASTRODUODENOSCOPY (EGD) WITH PROPOFOL N/A 09/01/2019   Procedure: ESOPHAGOGASTRODUODENOSCOPY (EGD) WITH PROPOFOL;  Surgeon: Lin Landsman, MD;  Location: [place];  Service: Gastroenterology;  Laterality: N/A;   FEMORAL ARTERY STENT  10/2010   right sided (Dr. Burt Knack)   KNEE ARTHROSCOPY WITH MEDIAL MENISECTOMY Right 08/20/2017   Procedure: KNEE ARTHROSCOPY WITH MEDIAL  AND LATERAL MENISECTOMY;  Surgeon: Hessie Knows, MD;  Location: [facility] ORS;  Service: Orthopedics;  Laterality: Right;   POSTERIOR CERVICAL LAMINECTOMY N/A 03/08/2018   Procedure: POSTERIOR CERVICAL LAMINECTOMY-C7;  Surgeon: Deetta Perla, MD;  Location: [facility] ORS;  Service: Neurosurgery;  Laterality: N/A;   TONSILLECTOMY     TOTAL KNEE ARTHROPLASTY Right 04/14/2019   Procedure: RIGHT TOTAL KNEE ARTHROPLASTY;  Surgeon: Hessie Knows, MD;  Location: [facility] ORS;  Service: Orthopedics;  Laterality: Right;   TUBAL LIGATION     Family History  Problem Relation Age of Onset   Hypertension Father    Heart failure Father    Diabetes Father    Colon cancer Father 63   Glaucoma Father    Cancer Father        colorectal    Heart disease Father    Other Father        glaucoma   Breast cancer Mother 86       breast cancer, late 58's   Cancer Mother         breast   Brain cancer Sister    Arthritis Sister    Diabetes Sister    Hypertension Sister    Kidney disease Sister    Diabetes Brother    Hypertension Brother    Other Sister        brain tumor    Acute myelogenous leukemia Grandson        06/2019   Coronary artery disease Neg Hx    Stroke Neg Hx    Social History   Socioeconomic History   Marital status: Legally Separated  Spouse name: Not on file   Number of children: Not on file   Years of education: Not on file   Highest education level: Not on file  Occupational History   Not on file  Tobacco Use   Smoking status: Former    Packs/day: 1.00    Years: 38.00    Pack years: 38.00    Types: Cigarettes    Quit date: 08/12/2010    Years since quitting: 10.9   Smokeless tobacco: Never  Vaping Use   Vaping Use: Never used  Substance and Sexual Activity   Alcohol use: No   Drug use: No    Types: Cocaine    Comment: Remote Hx (crack cocaine and marijuana).none since 73yrs plus   Sexual activity: Yes  Other Topics Concern   Not on file  Social History Narrative   Caffeine: 1 cup coffee/day   Lives with family, no pets   Occupation: industrial work, 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   Edu: 11th grade   Activity: no regular exercise   Diet: good water, vegetables daily, low salt diet   Lives with sister and other family    No guns, wears seat belts, safe in relationship    2 kids    GED 1 year of college    Social Determinants of Health   Financial Resource Strain: Low Risk    Difficulty of Paying Living Expenses: Not hard at all  Food Insecurity: No Food Insecurity   Worried About Charity fundraiser in the Last Year: Never true   Arboriculturist in the Last Year: Never true  Transportation Needs: No Transportation Needs   Lack of Transportation (Medical): No   Lack of Transportation (Non-Medical): No  Physical Activity: Unknown   Days of Exercise per Week: 0 days   Minutes of Exercise per Session: Not on file   Stress: No Stress Concern Present   Feeling of Stress : Not at all  Social Connections: Unknown   Frequency of Communication with Friends and Family: More than three times a week   Frequency of Social Gatherings with Friends and Family: Once a week   Attends Religious Services: 1 to 4 times per year   Active Member of [employer] or Organizations: Yes   Attends Music therapist: More than 4 times per year   Marital Status: Not on file  Intimate Partner Violence: Not At Risk   Fear of Current or Ex-Partner: No   Emotionally Abused: No   Physically Abused: No   Sexually Abused: No   Current Meds  Medication Sig   albuterol (VENTOLIN HFA) 108 (90 Base) MCG/ACT inhaler Inhale 2 puffs into the lungs every 6 (six) hours as needed for wheezing or shortness of breath.   aspirin EC 81 MG tablet Take 1 tablet (81 mg total) by mouth daily.   Azelastine HCl 0.15 % SOLN Place 2 sprays into both nostrils daily as needed (allergies).   benzonatate (TESSALON) 100 MG capsule Take 1 capsule (100 mg total) by mouth 3 (three) times daily as needed for cough.   cyclobenzaprine (FLEXERIL) 5 MG tablet Take 1-2 tablets (5-10 mg total) by mouth daily as needed for muscle spasms.   dapagliflozin propanediol (FARXIGA) 5 MG TABS tablet Take 1 tablet (5 mg total) by mouth daily before breakfast. DISCONTINUE METFORMIN   dicyclomine (BENTYL) 10 MG capsule Take 1 capsule (10 mg total) by mouth 3 (three) times daily before meals. As needed for abdominal pain  esomeprazole (NEXIUM) 40 MG capsule Take 1 capsule (40 mg total) by mouth daily.   Evolocumab (REPATHA SURECLICK) 735 MG/ML SOAJ INJECT THE CONTENTS OF 1 PEN INTO THE SKIN ONCE EVERY 14 DAYS   ezetimibe (ZETIA) 10 MG tablet Take 1 tablet (10 mg total) by mouth daily.   fluticasone (FLONASE) 50 MCG/ACT nasal spray Place 2 sprays into both nostrils daily as needed for allergies. Max dose 2 sprays each nostril   Fluticasone-Umeclidin-Vilant (TRELEGY ELLIPTA)  100-62.5-25 MCG/INH AEPB INHALE 1 PUFF ONCE DAILY   furosemide (LASIX) 20 MG tablet TAKE 1 TABLET BY MOUTH ONCE DAILY AND A SECOND TABLET IF NEEDED FOR FLUID BUILD UP   hydrOXYzine (ATARAX/VISTARIL) 25 MG tablet TAKE 1 TO 2 TABLETS BY MOUTH ONCE DAILY AS NEEDED   ipratropium (ATROVENT) 0.06 % nasal spray Place 2 sprays into both nostrils 4 (four) times daily.   isosorbide mononitrate (IMDUR) 30 MG 24 hr tablet Take 1 tablet (30 mg total) by mouth 2 (two) times daily.   Lancets (ONETOUCH DELICA PLUS [id number]) MISC 1 Device by Does not apply route in the morning and at bedtime.   levocetirizine (XYZAL) 5 MG tablet Take 1 tablet (5 mg total) by mouth at bedtime as needed for allergies.   linaclotide (LINZESS) 72 MCG capsule TAKE 1 CAPSULE BY MOUTH ONCE DAILY BEFORE BREAKFAST   meclizine (ANTIVERT) 25 MG tablet TAKE 1 TABLET BY MOUTH TWICE DAILY AS NEEDED FOR DIZZINESS   metoprolol succinate (TOPROL-XL) 50 MG 24 hr tablet TAKE 1 TABLET BY MOUTH ONCE DAILY WITH OR IMMEDIATELY FOLLOWING A MEAL   montelukast (SINGULAIR) 10 MG tablet Take 1 tablet (10 mg total) by mouth at bedtime.   NEEDLE, DISP, 25 G 25G X 1-1/2" MISC 1 Device by Does not apply route every 30 (thirty) days.   nystatin (MYCOSTATIN) 100000 UNIT/ML suspension Take 5 mLs (500,000 Units total) by mouth 4 (four) times daily.   olopatadine (PATANOL) 0.1 % ophthalmic solution Place 1 drop into both eyes 2 (two) times daily.   ondansetron (ZOFRAN) 4 MG tablet Take 1 tablet (4 mg total) by mouth every 8 (eight) hours as needed for nausea or vomiting.   ONETOUCH VERIO test strip USE AS DIRECTED   PARoxetine (PAXIL) 20 MG tablet Take 1 tablet (20 mg total) by mouth daily.   potassium chloride (KLOR-CON) 10 MEQ tablet Take 1 tablet (10 mEq total) by mouth daily.   pregabalin (LYRICA) 75 MG capsule Take 1 capsule (75 mg total) by mouth 2 (two) times daily.   rivaroxaban (XARELTO) 2.5 MG TABS tablet Take 1 tablet by mouth twice daily   rosuvastatin  (CRESTOR) 20 MG tablet Take 1 tablet (20 mg total) by mouth at bedtime.   sodium chloride (OCEAN) 0.65 % SOLN nasal spray Place 2 sprays into both nostrils daily as needed for congestion.   traZODone (DESYREL) 50 MG tablet Take 0.5-1 tablets (25-50 mg total) by mouth at bedtime as needed for sleep.   Vibegron (GEMTESA) 75 MG TABS Take 75 mg by mouth daily. D/c myrbetriq   [DISCONTINUED] benzonatate (TESSALON) 100 MG capsule Take 1 capsule (100 mg total) by mouth 2 (two) times daily as needed for cough.   [DISCONTINUED] mirabegron ER (MYRBETRIQ) 25 MG TB24 tablet Take 1 tablet (25 mg total) by mouth daily.   [DISCONTINUED] sodium chloride (OCEAN) 0.65 % SOLN nasal spray Place 2 sprays into both nostrils daily as needed for congestion.   Allergies  Allergen Reactions   Latex Rash  Shellfish Allergy Anaphylaxis    Hard shellfish/swelling in throat   Sulfonamide Derivatives Anaphylaxis    Swelling in throat.   Watermelon [Citrullus Vulgaris] Other (See Comments)    Throat itchy   Soy Allergy     Diarrhea/upset stomach     Tomato Itching    Throat itchy - also Raw carrots   Other Swelling   Recent Results (from the past 2160 hour(s))  Comprehensive metabolic panel     Status: Abnormal   Collection Time: 06/04/21  3:48 PM  Result Value Ref Range   Sodium 143 135 - 145 mEq/L   Potassium 3.9 3.5 - 5.1 mEq/L   Chloride 108 96 - 112 mEq/L   CO2 25 19 - 32 mEq/L   Glucose, Bld 75 70 - 99 mg/dL   BUN 16 6 - 23 mg/dL   Creatinine, Ser 1.36 (H) 0.40 - 1.20 mg/dL   Total Bilirubin 0.4 0.2 - 1.2 mg/dL   Alkaline Phosphatase 94 39 - 117 U/L   AST 17 0 - 37 U/L   ALT 17 0 - 35 U/L   Total Protein 7.2 6.0 - 8.3 g/dL   Albumin 4.5 3.5 - 5.2 g/dL   GFR 41.66 (L) >60.00 mL/min    Comment: Calculated using the CKD-EPI Creatinine Equation (2021)   Calcium 9.6 8.4 - 10.5 mg/dL  Hemoglobin A1c     Status: Abnormal   Collection Time: 06/04/21  3:48 PM  Result Value Ref Range   Hgb A1c MFr  Bld 6.6 (H) 4.6 - 6.5 %    Comment: Glycemic Control Guidelines for People with Diabetes:Non Diabetic:  <6%Goal of Therapy: <7%Additional Action Suggested:  >8%   Vitamin D (25 hydroxy)     Status: None   Collection Time: 06/04/21  3:48 PM  Result Value Ref Range   VITD 49.99 30.00 - 100.00 ng/mL  Urinalysis, Routine w reflex microscopic     Status: Abnormal   Collection Time: 06/04/21  3:48 PM  Result Value Ref Range   Color, Urine YELLOW YELLOW   APPearance CLEAR CLEAR   Specific Gravity, Urine 1.011 1.001 - 1.035   pH 5.5 5.0 - 8.0   Glucose, UA 1+ (A) NEGATIVE   Bilirubin Urine NEGATIVE NEGATIVE   Ketones, ur NEGATIVE NEGATIVE   Hgb urine dipstick NEGATIVE NEGATIVE   Protein, ur NEGATIVE NEGATIVE   Nitrite NEGATIVE NEGATIVE   Leukocytes,Ua NEGATIVE NEGATIVE  Urine Culture     Status: None   Collection Time: 06/04/21  3:48 PM   Specimen: Urine  Result Value Ref Range   MICRO NUMBER: [id number]    SPECIMEN QUALITY: Adequate    Sample Source NOT GIVEN    STATUS: FINAL    ISOLATE 1:      Mixed genital flora isolated. These superficial bacteria are not indicative of a urinary tract infection. No further organism identification is warranted on this specimen. If clinically indicated, recollect clean-catch, mid-stream urine and transfer  immediately to Urine Culture Transport Tube.    Objective  Body mass index is 36.38 kg/m. Wt Readings from Last 3 Encounters:  07/24/21 218 lb 9.6 oz (99.2 kg)  06/04/21 219 lb 6.4 oz (99.5 kg)  04/19/21 220 lb 3.2 oz (99.9 kg)   Temp Readings from Last 3 Encounters:  07/24/21 (!) 97.1 F (36.2 C) (Temporal)  06/24/21 99 F (37.2 C)  06/04/21 (!) 97.5 F (36.4 C) (Oral)   BP Readings from Last 3 Encounters:  07/24/21 118/80  06/04/21 106/62  04/19/21  115/80   Pulse Readings from Last 3 Encounters:  07/24/21 72  06/04/21 69  04/19/21 72    Physical Exam Vitals and nursing note reviewed.  Constitutional:      Appearance: Normal  appearance. She is well-developed and well-groomed.  HENT:     Head: Normocephalic and atraumatic.  Eyes:     Conjunctiva/sclera: Conjunctivae normal.     Pupils: Pupils are equal, round, and reactive to light.  Cardiovascular:     Rate and Rhythm: Normal rate and regular rhythm.     Heart sounds: Normal heart sounds. No murmur heard. Pulmonary:     Effort: Pulmonary effort is normal.     Breath sounds: Normal breath sounds.  Abdominal:     General: Abdomen is flat. Bowel sounds are normal.     Tenderness: There is no abdominal tenderness.  Musculoskeletal:        General: No tenderness.  Skin:    General: Skin is warm and dry.  Neurological:     General: No focal deficit present.     Mental Status: She is alert and oriented to person, place, and time. Mental status is at baseline.     Cranial Nerves: Cranial nerves 2-12 are intact.     Gait: Gait is intact.  Psychiatric:        Attention and Perception: Attention and perception normal.        Mood and Affect: Mood and affect normal.        Speech: Speech normal.        Behavior: Behavior normal. Behavior is cooperative.        Thought Content: Thought content normal.        Cognition and Memory: Cognition and memory normal.        Judgment: Judgment normal.    Assessment  Plan  Chronic right shoulder  and b/l with pain with rom and chronic pain/chronic groin pain- Plan: Ambulatory referral to Orthopedic Surgery Dr. Roland Rack  Chest congestion - Plan: DG Chest 2 View PND (post-nasal drip) - Plan: ipratropium (ATROVENT) 0.06 % nasal spray, sodium chloride (OCEAN) 0.65 % SOLN nasal spray Stop flonase for now  Allergy pill xyzal and trelegy  Cough, unspecified type - Plan: benzonatate (TESSALON) 100 MG capsule  Chronic pain of both shoulders - Plan: Ambulatory referral to Orthopedic Surgery  Elevated serum creatinine - Plan: Basic Metabolic Panel (BMET), Microalbumin / creatinine urine ratio, Sodium, urine, random, Urinalysis,  Routine w reflex microscopic  Overactive bladder - Plan: Vibegron (GEMTESA) 75 MG TABS, Urinalysis, Routine w reflex microscopic Stop myrbetriq 25 mg qd   Copd exacerbation  Xray today  Zpack and prednisone 40 mg x 5 days IMPRESSION: Changes of emphysema, with no evidence of acute cardiopulmonary disease   Surgical changes of median sternotomy and CABG     Electronically Signed   By: Corrie Mckusick D.O.   On: 07/24/2021 10:11   HM Flu shot utd Consider shingrix in future at pharmacy given Rx 01/10/21  pna 23 had 03/10/16 consider in future  covid 2/2  pfizer consider booster disc prev Tdap 05/05/2019 Consider prevnar in future   S/p hysterectomy no cervix and f/u OB/GYN h/o abnormal pap s/p cryo -pap 05/08/15 neg pap will  Repeat In 5 years   Colonoscopy Dr. Allen Norris 01/2017 polyps, gastritis, diverticulosis est Dr. Allen Norris f/u 01/27/2022  referred today est with Dr. Marius Ditch 03/20/20    Mammogram 10/13/2018 negative re ordered sch 09/01/19 left dx mammogram As of 3 or 10/28/19  utd benign cyst likely right breast rec [place] in 6 months to f/u ordered and sch 08/08/20 RECOMMENDATION: Bilateral diagnostic mammogram and RIGHT breast ultrasound in 3 months to ensure 1 year evaluation as per patient desire.   Mammogram 4//13/22 dx mammogram neg repeat in 1 year b/l diagnostic mammogram 1 year and right breast US due 11/06/21    Rec healthy diet and exercise    Provider: Dr. Olivia Mackie [name]

## 2021-07-24 NOTE — Addendum Note (Signed)
Addended by: Orland Mustard on: 07/24/2021 12:40 PM   Modules accepted: Orders

## 2021-07-24 NOTE — Patient Instructions (Addendum)
MD Physician   Primary Contact Information  Phone Fax E-mail Address  [phone] Not available [place]   [facility] [place] [address]     Specialties     Orthopedic Surgery      Vibegron Oral Tablets What is this medication? VIBEGRON (vye BEG ron) is used to treat overactive bladder. This medicine reduces the amount of bathroom visits. It may also help to control wetting accidents. This medicine may be used for other purposes; ask your health care provider or pharmacist if you have questions. COMMON BRAND NAME(S): GEMTESA What should I tell my care team before I take this medication? They need to know if you have any of these conditions: kidney disease liver disease prostate disease trouble passing urine an unusual or allergic reaction to vibegron, other medicines, foods, dyes, or preservatives pregnant or trying to get pregnant breast-feeding How should I use this medication? Take this medicine by mouth with water. Take it as directed on the prescription label at the same time every day. Swallow the tablets whole. You can take it with or without food. If it upsets your stomach, take it with food. You may also crush the tablet and put the contents in 1 tablespoon (15 mL) of applesauce. Swallow the medicine and applesauce right away. Take your medicine at regular intervals. Do not take it more often than directed. Keep taking it unless your health care provider tells you to stop. Talk to your pediatrician regarding the use of this medicine in children. Special care may be needed. Overdosage: If you think you have taken too much of this medicine contact a poison control center or emergency room at once. NOTE: This medicine is only for you. Do not share this medicine with others. What if I miss a dose? If you miss a dose, take it as soon as you can. If it is almost time for your next dose, take only that dose. Do not take double or extra  doses. What may interact with this medication? digoxin This list may not describe all possible interactions. Give your health care provider a list of all the medicines, herbs, non-prescription drugs, or dietary supplements you use. Also tell them if you smoke, drink alcohol, or use illegal drugs. Some items may interact with your medicine. What should I watch for while using this medication? Visit your health care provider for regular checks on your progress. Tell your health care provider if your symptoms do not start to get better or if they get worse. You may need to limit your intake of tea, coffee, caffeinated sodas, or alcohol. These drinks may make your symptoms worse. What side effects may I notice from receiving this medication? Side effects that you should report to your doctor or health care professional as soon as possible: allergic reactions like skin rash, itching or hives, swelling of the face, lips, or tongue infection (fever, chills, cough, sore throat, pain or trouble passing urine) trouble passing urine or change in the amount of urine Side effects that usually do not require medical attention (report these to your doctor or health care professional if they continue or are bothersome): constipation diarrhea dry mouth headache nasal congestion (runny or stuffy nose) nausea sore throat This list may not describe all possible side effects. Call your doctor for medical advice about side effects. You may report side effects to FDA at 1-800-FDA-1088. Where should I keep my medication? Keep out of the reach of children and  pets. Store at room temperature between 20 and 25 degrees C (68 and 77 degrees F). NOTE: This sheet is a summary. It may not cover all possible information. If you have questions about this medicine, talk to your doctor, pharmacist, or health care provider.  2022 Elsevier/Gold Standard (2021-04-02 00:00:00)

## 2021-07-25 ENCOUNTER — Encounter: Payer: Self-pay | Admitting: *Deleted

## 2021-07-25 LAB — URINALYSIS, ROUTINE W REFLEX MICROSCOPIC
Bacteria, UA: NONE SEEN /HPF
Bilirubin Urine: NEGATIVE
Glucose, UA: NEGATIVE
Hgb urine dipstick: NEGATIVE
Hyaline Cast: NONE SEEN /LPF
Ketones, ur: NEGATIVE
Leukocytes,Ua: NEGATIVE
Nitrite: NEGATIVE
RBC / HPF: NONE SEEN /HPF (ref 0–2)
Specific Gravity, Urine: 1.025 (ref 1.001–1.035)
Squamous Epithelial / HPF: NONE SEEN /HPF (ref <=5)
WBC, UA: NONE SEEN /HPF (ref 0–5)
pH: 6 (ref 5.0–8.0)

## 2021-07-25 LAB — SODIUM, URINE, RANDOM: Sodium, Ur: 33 mmol/L (ref 28–272)

## 2021-07-25 LAB — MICROSCOPIC MESSAGE

## 2021-07-26 ENCOUNTER — Other Ambulatory Visit: Payer: Self-pay | Admitting: Cardiovascular Disease

## 2021-07-30 ENCOUNTER — Other Ambulatory Visit: Payer: Self-pay | Admitting: Internal Medicine

## 2021-07-30 ENCOUNTER — Other Ambulatory Visit: Payer: Self-pay | Admitting: Cardiovascular Disease

## 2021-07-30 DIAGNOSIS — F419 Anxiety disorder, unspecified: Secondary | ICD-10-CM

## 2021-07-30 DIAGNOSIS — F32A Depression, unspecified: Secondary | ICD-10-CM

## 2021-07-30 DIAGNOSIS — M6281 Muscle weakness (generalized): Secondary | ICD-10-CM | POA: Diagnosis not present

## 2021-07-30 DIAGNOSIS — M25511 Pain in right shoulder: Secondary | ICD-10-CM | POA: Diagnosis not present

## 2021-07-30 DIAGNOSIS — M542 Cervicalgia: Secondary | ICD-10-CM | POA: Diagnosis not present

## 2021-07-30 NOTE — Telephone Encounter (Signed)
Scheduled

## 2021-07-30 NOTE — Telephone Encounter (Signed)
Please schedule 6 month F/U appointment. Thank you! 

## 2021-08-02 DIAGNOSIS — D2362 Other benign neoplasm of skin of left upper limb, including shoulder: Secondary | ICD-10-CM | POA: Diagnosis not present

## 2021-08-02 DIAGNOSIS — L821 Other seborrheic keratosis: Secondary | ICD-10-CM | POA: Diagnosis not present

## 2021-08-02 DIAGNOSIS — L708 Other acne: Secondary | ICD-10-CM | POA: Diagnosis not present

## 2021-08-06 ENCOUNTER — Ambulatory Visit (INDEPENDENT_AMBULATORY_CARE_PROVIDER_SITE_OTHER): Payer: Medicare HMO

## 2021-08-06 ENCOUNTER — Other Ambulatory Visit: Payer: Self-pay | Admitting: Internal Medicine

## 2021-08-06 VITALS — Ht 65.0 in | Wt 218.0 lb

## 2021-08-06 DIAGNOSIS — Z0000 Encounter for general adult medical examination without abnormal findings: Secondary | ICD-10-CM | POA: Diagnosis not present

## 2021-08-06 DIAGNOSIS — G629 Polyneuropathy, unspecified: Secondary | ICD-10-CM

## 2021-08-06 NOTE — Patient Instructions (Addendum)
°  [name] , Thank you for taking time to come for your Medicare Wellness Visit. I appreciate your ongoing commitment to your health goals. Please review the following plan we discussed and let me know if I can assist you in the future.   These are the goals we discussed:  Goals       Patient Stated     DIET - INCREASE WATER INTAKE (pt-stated)      Follow up with Primary Care Provider (pt-stated)      As needed      Medication Monitoring (pt-stated)      Patient Goals/Self-Care Activities Over the next 90 days, patient will:  - take medications as prescribed check glucose periodically, document, and provide at future appointments check blood pressure periodically, document, and provide at future appointments weigh daily, and contact provider if weight gain of >3 lbs in 1 day or >5 lbs per week         This is a list of the screening recommended for you and due dates:  Health Maintenance  Topic Date Due   Complete foot exam   Never done   Pap Smear  05/12/2021   COVID-19 Vaccine (3 - Pfizer risk series) 08/22/2021*   Zoster (Shingles) Vaccine (1 of 2) 11/04/2021*   Pneumococcal Vaccination (1 - PCV) 03/14/2022*   Hemoglobin A1C  12/02/2021   Eye exam for diabetics  01/17/2022   Colon Cancer Screening  01/27/2022   Urine Protein Check  07/24/2022   Mammogram  11/08/2022   Tetanus Vaccine  05/04/2029   Flu Shot  Completed   Hepatitis C Screening: USPSTF Recommendation to screen - Ages 18-79 yo.  Completed   HIV Screening  Completed   HPV Vaccine  Aged Out  *Topic was postponed. The date shown is not the original due date.

## 2021-08-06 NOTE — Progress Notes (Signed)
Subjective:   [name] is a 61 y.o. female who presents for Medicare Annual (Subsequent) preventive examination.  Review of Systems    No ROS.  Medicare Wellness Virtual Visit.  Visual/audio telehealth visit, UTA vital signs.   See social history for additional risk factors.   Cardiac Risk Factors include: advanced age (>19men, >17 women);hypertension;diabetes mellitus     Objective:    Today's Vitals   08/06/21 1118  Weight: 218 lb (98.9 kg)  Height: 5\' 5"  (1.651 m)   Body mass index is 36.28 kg/m.  Advanced Directives 08/06/2021 09/11/2020 08/03/2020 09/01/2019 08/03/2019 04/14/2019 09/05/2018  Does Patient Have a Medical Advance Directive? Yes No Yes No Yes No No  Type of Paramedic of [place];Living will - [place];Living will - [place];Living will - -  Does patient want to make changes to medical advance directive? No - Patient declined - No - Patient declined - No - Patient declined - -  Copy of [place] in Chart? Yes - validated most recent copy scanned in chart (See row information) - Yes - validated most recent copy scanned in chart (See row information) - Yes - validated most recent copy scanned in chart (See row information) - -  Would patient like information on creating a medical advance directive? - Yes (MAU/Ambulatory/Procedural Areas - Information given) - No - Patient declined - Yes (Inpatient - patient requests chaplain consult to create a medical advance directive) -    Current Medications (verified) Outpatient Encounter Medications as of 08/06/2021  Medication Sig   albuterol (VENTOLIN HFA) 108 (90 Base) MCG/ACT inhaler Inhale 2 puffs into the lungs every 6 (six) hours as needed for wheezing or shortness of breath.   aspirin EC 81 MG tablet Take 1 tablet (81 mg total) by mouth daily.   Azelastine HCl 0.15 % SOLN Place 2 sprays into both nostrils daily as needed (allergies).    benzonatate (TESSALON) 100 MG capsule Take 1 capsule (100 mg total) by mouth 3 (three) times daily as needed for cough.   Cyanocobalamin (VITAMIN B-12) 1000 MCG SUBL Place 0.001 tablets (1 mcg total) under the tongue daily.   cyclobenzaprine (FLEXERIL) 5 MG tablet Take 1-2 tablets (5-10 mg total) by mouth daily as needed for muscle spasms.   dapagliflozin propanediol (FARXIGA) 5 MG TABS tablet Take 1 tablet (5 mg total) by mouth daily before breakfast. DISCONTINUE METFORMIN   dicyclomine (BENTYL) 10 MG capsule Take 1 capsule (10 mg total) by mouth 3 (three) times daily before meals. As needed for abdominal pain   esomeprazole (NEXIUM) 40 MG capsule Take 1 capsule (40 mg total) by mouth daily.   Evolocumab (REPATHA SURECLICK) 378 MG/ML SOAJ INJECT THE CONTENTS OF 1 PEN INTO THE SKIN ONCE EVERY 14 DAYS PLEASE SCHEDULE OFFICE VISIT FOR FURTHER REFILLS. THANK YOU!   ezetimibe (ZETIA) 10 MG tablet Take 1 tablet (10 mg total) by mouth daily.   fluticasone (FLONASE) 50 MCG/ACT nasal spray Place 2 sprays into both nostrils daily as needed for allergies. Max dose 2 sprays each nostril   Fluticasone-Umeclidin-Vilant (TRELEGY ELLIPTA) 100-62.5-25 MCG/INH AEPB INHALE 1 PUFF ONCE DAILY   furosemide (LASIX) 20 MG tablet TAKE 1 TABLET BY MOUTH ONCE DAILY AND A SECOND TABLET IF NEEDED FOR FLUID BUILD UP   hydrOXYzine (ATARAX/VISTARIL) 25 MG tablet TAKE 1 TO 2 TABLETS BY MOUTH ONCE DAILY AS NEEDED   ipratropium (ATROVENT) 0.06 % nasal spray Place 2 sprays into both  nostrils 4 (four) times daily.   isosorbide mononitrate (IMDUR) 30 MG 24 hr tablet Take 1 tablet by mouth twice daily   Lancets (ONETOUCH DELICA PLUS [id number]) MISC 1 Device by Does not apply route in the morning and at bedtime.   levocetirizine (XYZAL) 5 MG tablet Take 1 tablet (5 mg total) by mouth at bedtime as needed for allergies.   linaclotide (LINZESS) 72 MCG capsule TAKE 1 CAPSULE BY MOUTH ONCE DAILY BEFORE BREAKFAST   meclizine (ANTIVERT) 25 MG  tablet TAKE 1 TABLET BY MOUTH TWICE DAILY AS NEEDED FOR DIZZINESS   metoprolol succinate (TOPROL-XL) 50 MG 24 hr tablet TAKE 1 TABLET BY MOUTH ONCE DAILY WITH OR IMMEDIATELY FOLLOWING A MEAL   montelukast (SINGULAIR) 10 MG tablet Take 1 tablet (10 mg total) by mouth at bedtime.   NEEDLE, DISP, 25 G 25G X 1-1/2" MISC 1 Device by Does not apply route every 30 (thirty) days.   nystatin (MYCOSTATIN) 100000 UNIT/ML suspension Take 5 mLs (500,000 Units total) by mouth 4 (four) times daily.   olopatadine (PATANOL) 0.1 % ophthalmic solution Place 1 drop into both eyes 2 (two) times daily.   ondansetron (ZOFRAN) 4 MG tablet Take 1 tablet (4 mg total) by mouth every 8 (eight) hours as needed for nausea or vomiting.   ONETOUCH VERIO test strip USE AS DIRECTED   PARoxetine (PAXIL) 20 MG tablet Take 1 tablet by mouth once daily   potassium chloride (KLOR-CON) 10 MEQ tablet Take 1 tablet (10 mEq total) by mouth daily.   predniSONE (DELTASONE) 20 MG tablet Take 2 tablets (40 mg total) by mouth daily with breakfast. 5 days   pregabalin (LYRICA) 75 MG capsule Take 1 capsule (75 mg total) by mouth 2 (two) times daily.   rivaroxaban (XARELTO) 2.5 MG TABS tablet Take 1 tablet by mouth twice daily   rosuvastatin (CRESTOR) 20 MG tablet Take 1 tablet (20 mg total) by mouth at bedtime.   sodium chloride (OCEAN) 0.65 % SOLN nasal spray Place 2 sprays into both nostrils daily as needed for congestion.   traZODone (DESYREL) 50 MG tablet Take 0.5-1 tablets (25-50 mg total) by mouth at bedtime as needed for sleep.   ipratropium-albuterol (DUONEB) 0.5-2.5 (3) MG/3ML SOLN Take 3 mLs by nebulization 2 (two) times daily as needed (wheezing/shortness of breath). (Patient not taking: Reported on 07/24/2021)   losartan (COZAAR) 25 MG tablet Take 0.5 tablets (12.5 mg total) by mouth daily.   nitroGLYCERIN (NITROSTAT) 0.4 MG SL tablet DISSOLVE ONE TABLET UNDER THE TONGUE EVERY 5 MINUTES AS NEEDED FOR CHEST PAIN.  DO NOT EXCEED A TOTAL  OF 3 DOSES IN 15 MINUTES (Patient not taking: Reported on 07/24/2021)   Vibegron (GEMTESA) 75 MG TABS Take 75 mg by mouth daily. D/c myrbetriq   No facility-administered encounter medications on file as of 08/06/2021.    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 being followed by neurosurgery/eye doctor and pcp. monitoring size.causes sinus problems   Bronchitis    Cardiac arrhythmia    Cervical neck pain with evidence of disc disease    C5/6 disease, MRI done late 2012 -  no records available   Chronic combined systolic and diastolic CHF (congestive heart failure) ([place])    a. 08/2010 Echo: mildly reduced EF 40-45%, mild diffuse hypokinesis; b. 06/2016 Echo: EF 50%, no rwma, mild to mod TR; c. 03/2017 Echo: EF 40-45%, Gr1 DD (prior echo reviewed and EF felt to be lower than reported).   Chronic pain    Cocaine abuse, in remission (HCC)    clean x 24 years   Colon polyps    COPD (chronic obstructive pulmonary disease) ([place])    a. 12/2018 PFT: No obvious obst/restrictive dzs.   Coronary artery disease    a. PCI of LCX 2003; b. PCI of the LAD 2012 with a (2.5 x 8 mm BMS);  c.s/p CABG 4/12: L-LAD, VG-Dx, VG-OM, VG-RCA (Dr. Prescott Gum);  d. 01/2012 MV: inf infarct, attenuation, no ischemia; e. 02/2017 MV: signifi attenuation artifact. Fixed basal antlat/inflat scar vs artifact. Reversible apical lat and mid antlat defect - ? atten vs ischemia. F/u echo w/o wma->Med rx.   Depression    Diabetes mellitus without complication ([place])    Family history of colon cancer    Generalized headaches    frequent   GERD (gastroesophageal reflux disease)    Headache    Heart murmur     Hematochezia    Hepatitis    history of hepatitis b   History of cervical cancer    s/p cryotherapy   History of drug abuse ([place])    cocaine, marijuana, clean since 1989   History of hepatitis B    from eating undercooked liver   History of MI (myocardial infarction)    Hyperlipidemia    Hypertension    Ischemic cardiomyopathy    a. 03/2017 Echo: EF 40-45%, Gr1 DD.   Myocardial infarction ([place]) 2003, 2012   PAD (peripheral artery disease) ([place])    a. s/p Right SFA atherectomy and PTA 01/15/11; b. 07/2018 ABI: R 1.02, L 1.09.   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 Mass abuts R cavernous sinus w/o definite invasion.   Polyp of colon    Seasonal allergies    Sleep apnea    uses cpap   Smoking history    quit 07/2010   Thyroid disease    Urinary incontinence    Past Surgical History:  Procedure Laterality Date   ABDOMINAL HYSTERECTOMY     2003   CHOLECYSTECTOMY  1986   COLONOSCOPY  2008   3 polyps   COLONOSCOPY WITH PROPOFOL N/A 02/06/2015   Procedure: COLONOSCOPY WITH PROPOFOL;  Surgeon: Lucilla Lame, MD;  Location: [facility] ENDOSCOPY;  Service: Endoscopy;  Laterality: N/A;   COLONOSCOPY WITH PROPOFOL N/A 01/27/2017   Procedure: COLONOSCOPY WITH PROPOFOL;  Surgeon: Lucilla Lame, MD;  Location: [facility] ENDOSCOPY;  Service: Endoscopy;  Laterality: N/A;   CORONARY ANGIOPLASTY     w/ stent placement x2   CORONARY ARTERY BYPASS GRAFT  2012   Dr [name]   CORONARY STENT PLACEMENT  2003   S/P MI   CORONARY STENT PLACEMENT  2007   [place]   ESOPHAGOGASTRODUODENOSCOPY (EGD) WITH PROPOFOL N/A 02/06/2015   Procedure: ESOPHAGOGASTRODUODENOSCOPY (EGD) WITH PROPOFOL;  Surgeon: Lucilla Lame, MD;  Location: [facility] ENDOSCOPY;  Service: Endoscopy;  Laterality: N/A;   ESOPHAGOGASTRODUODENOSCOPY (EGD) WITH PROPOFOL N/A 01/27/2017   Procedure: ESOPHAGOGASTRODUODENOSCOPY (EGD) WITH PROPOFOL;  Surgeon: Lucilla Lame, MD;  Location: [facility] ENDOSCOPY;  Service:  Endoscopy;  Laterality: N/A;   ESOPHAGOGASTRODUODENOSCOPY (EGD) WITH  PROPOFOL N/A 09/01/2019   Procedure: ESOPHAGOGASTRODUODENOSCOPY (EGD) WITH PROPOFOL;  Surgeon: Lin Landsman, MD;  Location[facility] ENDOSCOPY;  Service: Gastroenterology;  Laterality: N/A;   FEMORAL ARTERY STENT  10/2010   right sided (Dr. Burt Knack)   KNEE ARTHROSCOPY WITH MEDIAL MENISECTOMY Right 08/20/2017   Procedure: KNEE ARTHROSCOPY WITH MEDIAL  AND LATERAL MENISECTOMY;  Surgeon: Hessie Knows, MD;  Location: [facility] ORS;  Service: Orthopedics;  Laterality: Right;   POSTERIOR CERVICAL LAMINECTOMY N/A 03/08/2018   Procedure: POSTERIOR CERVICAL LAMINECTOMY-C7;  Surgeon: Deetta Perla, MD;  Location: [facility] ORS;  Service: Neurosurgery;  Laterality: N/A;   TONSILLECTOMY     TOTAL KNEE ARTHROPLASTY Right 04/14/2019   Procedure: RIGHT TOTAL KNEE ARTHROPLASTY;  Surgeon: Hessie Knows, MD;  Location: [facility] ORS;  Service: Orthopedics;  Laterality: Right;   TUBAL LIGATION     Family History  Problem Relation Age of Onset   Hypertension Father    Heart failure Father    Diabetes Father    Colon cancer Father 62   Glaucoma Father    Cancer Father        colorectal    Heart disease Father    Other Father        glaucoma   Breast cancer Mother 82       breast cancer, late 64's   Cancer Mother        breast   Brain cancer Sister    Arthritis Sister    Diabetes Sister    Hypertension Sister    Kidney disease Sister    Diabetes Brother    Hypertension Brother    Other Sister        brain tumor    Acute myelogenous leukemia Grandson        06/2019   Coronary artery disease Neg Hx    Stroke Neg Hx    Social History   Socioeconomic History   Marital status: Legally Separated    Spouse name: Not on file   Number of children: Not on file   Years of education: Not on file   Highest education level: Not on file  Occupational History   Not on file  Tobacco Use   Smoking status: Former    Packs/day: 1.00    Years: 38.00     Pack years: 38.00    Types: Cigarettes    Quit date: 08/12/2010    Years since quitting: 10.9   Smokeless tobacco: Never  Vaping Use   Vaping Use: Never used  Substance and Sexual Activity   Alcohol use: No   Drug use: No    Types: Cocaine    Comment: Remote Hx (crack cocaine and marijuana).none since 82yrs plus   Sexual activity: Yes  Other Topics Concern   Not on file  Social History Narrative   Caffeine: 1 cup coffee/day   Lives with family, no pets   Occupation: industrial work, 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   Edu: 11th grade   Activity: no regular exercise   Diet: good water, vegetables daily, low salt diet   Lives with sister and other family    No guns, wears seat belts, safe in relationship    2 kids    GED 1 year of college    Social Determinants of Health   Financial Resource Strain: Low Risk    Difficulty of Paying Living Expenses: Not hard at all  Food Insecurity: No Food Insecurity   Worried About Charity fundraiser in the Last Year: Never true  Ran Out of Food in the Last Year: Never true  Transportation Needs: No Transportation Needs   Lack of Transportation (Medical): No   Lack of Transportation (Non-Medical): No  Physical Activity: Not on file  Stress: No Stress Concern Present   Feeling of Stress : Not at all  Social Connections: Unknown   Frequency of Communication with Friends and Family: More than three times a week   Frequency of Social Gatherings with Friends and Family: Once a week   Attends Religious Services: 1 to 4 times per year   Active Member of [employer] or Organizations: Yes   Attends Music therapist: More than 4 times per year   Marital Status: Not on file    Tobacco Counseling Counseling given: Not Answered   Clinical Intake:  Pre-visit preparation completed: Yes        Diabetes: Yes (Followed by pcp)  How often do you need to have someone help you when you read instructions, pamphlets, or other written  materials from your doctor or pharmacy?: 1 - Never  Interpreter Needed?: No      Activities of Daily Living In your present state of health, do you have any difficulty performing the following activities: 08/06/2021  Hearing? N  Vision? N  Difficulty concentrating or making decisions? N  Walking or climbing stairs? N  Comment Paces self when walking  Dressing or bathing? N  Doing errands, shopping? N  Preparing Food and eating ? N  Using the Toilet? N  In the past six months, have you accidently leaked urine? Y  Comment Followed by pcp. Managed with daily pad. Awaiting new Rx approval  Do you have problems with loss of bowel control? N  Managing your Medications? N  Managing your Finances? Y  Comment Sister manages  Housekeeping or managing your Housekeeping? N  Some recent data might be hidden    Patient Care Team: [name], [name], MD as PCP - General (Internal Medicine) Minna Merritts, MD as PCP - Cardiology (Cardiology) De Hollingshead, RPH-CPP (Pharmacist)  Indicate any recent Medical Services you may have received from other than [facility] providers in the past year (date may be approximate).     Assessment:   This is a routine wellness examination for [name].  Virtual Visit via Telephone Note  I connected with  [name] on 08/06/21 at 11:15 AM EST by telephone and verified that I am speaking with the correct person using two identifiers.  Persons participating in the virtual visit: patient/Nurse Health Advisor   I discussed the limitations, risks, security and privacy concerns of performing an evaluation and management service by telephone and the availability of in person appointments. The patient expressed understanding and agreed to proceed.  Interactive audio and video telecommunications were attempted between this nurse and patient, however failed, due to patient having technical difficulties OR patient did not have access to video  capability.  We continued and completed visit with audio only.  Some vital signs may be absent or patient reported.   Hearing/Vision screen Hearing Screening - Comments:: Patient is able to hear conversational tones without difficulty. No issues reported.  Vision Screening - Comments:: Wears corrective lenses They have seen their ophthalmologist.  Dietary issues and exercise activities discussed: Current Exercise Habits: Home exercise routine, Type of exercise: walking, Intensity: Mild Regular diet Good water intake   Goals Addressed               This Visit's Progress  Patient Stated     Follow up with Primary Care Provider (pt-stated)        As needed       Depression Screen PHQ 2/9 Scores 08/06/2021 04/19/2021 09/11/2020 08/03/2020 07/24/2020 02/21/2020 10/13/2019  PHQ - 2 Score 0 0 3 0 0 3 -  PHQ- 9 Score - - 12 - - 9 -  Exception Documentation - - - - - - Other- indicate reason in comment box  Not completed - - - - - - Patient's Grandson is very sick    Fall Risk Fall Risk  08/06/2021 04/19/2021 01/10/2021 10/16/2020 10/03/2020  Falls in the past year? 0 0 0 1 0  Number falls in past yr: 0 0 0 0 0  Injury with Fall? - - 0 1 0  Risk for fall due to : - - - History of fall(s) -  Follow up Falls evaluation completed Falls evaluation completed Falls evaluation completed Falls evaluation completed Falls evaluation completed    [place]: Home free of loose throw rugs in walkways, pet beds, electrical cords, etc? Yes  Adequate lighting in your home to reduce risk of falls? Yes   ASSISTIVE DEVICES UTILIZED TO PREVENT FALLS: Life alert? No  Use of a cane, walker or w/c? No   TIMED UP AND GO: Was the test performed? No .   Cognitive Function:  Patient is alert and oriented x3.   Enjoys reading for brain health.   6CIT Screen 08/06/2021 08/03/2020 08/03/2019  What Year? 0 points 0 points 0 points  What month? 0 points 0 points 0 points   What time? 0 points 0 points 0 points  Count back from 20 0 points 0 points 0 points  Months in reverse 0 points 4 points 0 points  Repeat phrase 0 points 4 points 0 points  Total Score 0 8 0    Immunizations Immunization History  Administered Date(s) Administered   Influenza Split 05/22/2015, 04/21/2016   Influenza,inj,Quad PF,6+ Mos 04/05/2018, 03/29/2019, 04/19/2020, 04/19/2021   PFIZER(Purple Top)SARS-COV-2 Vaccination 01/03/2020, 01/24/2020   Tdap 05/05/2019   Shingrix Completed?: No.    Education has been provided regarding the importance of this vaccine. Patient has been advised to call insurance company to determine out of pocket expense if they have not yet received this vaccine. Advised may also receive vaccine at local pharmacy or Health Dept. Verbalized acceptance and understanding.  Screening Tests Health Maintenance  Topic Date Due   FOOT EXAM  Never done   PAP SMEAR-Modifier  05/12/2021   COVID-19 Vaccine (3 - Pfizer risk series) 08/22/2021 (Originally 02/21/2020)   Zoster Vaccines- Shingrix (1 of 2) 11/04/2021 (Originally [birth year]1977)   Pneumococcal Vaccine 59-79 Years old (1 - PCV) 03/14/2022 (Originally 08/14/1966)   HEMOGLOBIN A1C  12/02/2021   OPHTHALMOLOGY EXAM  01/17/2022   COLONOSCOPY (Pts 45-34yrs Insurance coverage will need to be confirmed)  01/27/2022   URINE MICROALBUMIN  07/24/2022   MAMMOGRAM  11/08/2022   TETANUS/TDAP  05/04/2029   INFLUENZA VACCINE  Completed   Hepatitis C Screening  Completed   HIV Screening  Completed   HPV VACCINES  Aged Out   Health Maintenance Health Maintenance Due  Topic Date Due   FOOT EXAM  Never done   PAP SMEAR-Modifier  05/12/2021   Vision Screening: Recommended annual ophthalmology exams for early detection of glaucoma and other disorders of the eye.  Dental Screening: Recommended annual dental exams for proper oral hygiene  Community Resource Referral /  Chronic Care Management: CRR required this visit?  No    CCM required this visit?  No      Plan:     I have personally reviewed and noted the following in the patients chart:   Medical and social history Use of alcohol, tobacco or illicit drugs  Current medications and supplements including opioid prescriptions.  Functional ability and status Nutritional status Physical activity Advanced directives List of other physicians Hospitalizations, surgeries, and ER visits in previous 12 months Vitals Screenings to include cognitive, depression, and falls Referrals and appointments  In addition, I have reviewed and discussed with patient certain preventive protocols, quality metrics, and best practice recommendations. A written personalized care plan for preventive services as well as general preventive health recommendations were provided to patient.     Varney Biles, LPN   8/40/3353

## 2021-08-07 ENCOUNTER — Other Ambulatory Visit: Payer: Self-pay | Admitting: Internal Medicine

## 2021-08-07 DIAGNOSIS — G629 Polyneuropathy, unspecified: Secondary | ICD-10-CM

## 2021-08-07 MED ORDER — PREGABALIN 75 MG PO CAPS: 75.0000 mg | ORAL_CAPSULE | Freq: Two times a day (BID) | ORAL | 5 refills | Status: DC

## 2021-08-08 DIAGNOSIS — M542 Cervicalgia: Secondary | ICD-10-CM | POA: Diagnosis not present

## 2021-08-08 DIAGNOSIS — M25511 Pain in right shoulder: Secondary | ICD-10-CM | POA: Diagnosis not present

## 2021-08-08 DIAGNOSIS — M6281 Muscle weakness (generalized): Secondary | ICD-10-CM | POA: Diagnosis not present

## 2021-08-13 ENCOUNTER — Telehealth: Payer: Self-pay | Admitting: Internal Medicine

## 2021-08-13 NOTE — Telephone Encounter (Signed)
She has atrovent nasal and xyzal has she tried to use these?  Also use nasal saline to flush nose 1st before atrovent

## 2021-08-13 NOTE — Telephone Encounter (Signed)
Patient was seen by Dr Olivia Mackie on 07/24/2021 and was given a antibiotic and steroids. Patient's cough got a little better, but she has drainage that is making her cough. She would like something for the sinus drainage.

## 2021-08-13 NOTE — Telephone Encounter (Signed)
Okay to send something in or does Patient need to be re-evaluated?

## 2021-08-14 ENCOUNTER — Other Ambulatory Visit: Payer: Self-pay | Admitting: Internal Medicine

## 2021-08-14 ENCOUNTER — Encounter: Payer: Self-pay | Admitting: Internal Medicine

## 2021-08-14 DIAGNOSIS — J449 Chronic obstructive pulmonary disease, unspecified: Secondary | ICD-10-CM

## 2021-08-14 DIAGNOSIS — J439 Emphysema, unspecified: Secondary | ICD-10-CM

## 2021-08-14 MED ORDER — ALBUTEROL SULFATE HFA 108 (90 BASE) MCG/ACT IN AERS: 2.0000 | INHALATION_SPRAY | Freq: Four times a day (QID) | RESPIRATORY_TRACT | 12 refills | Status: DC | PRN

## 2021-08-14 NOTE — Telephone Encounter (Signed)
Will address further at her next office visit on 08/26/21.

## 2021-08-14 NOTE — Telephone Encounter (Signed)
Patient calling back in and informed of the below. Patient states the cough is still persistent and can feel the congestion in the chest. Wanting to know if Mucinex is safe to take with all her other medications and heart history?   States a little bit of blood and a blood plug came up with it. Please advise

## 2021-08-14 NOTE — Telephone Encounter (Signed)
LMTCB

## 2021-08-14 NOTE — Telephone Encounter (Signed)
[name]  Sugar free Robitussin DM or Mucinex DM green label is ok to take (none with decongestant in it I.e phenylephrine)  Also recommend nebulizer treatment as needed for cough, shortness of breath, wheezing  Use trelegy daily rinse your mouth  Will send in also as needed albuterol inhaler if needed for above symptom  Warm tea with honey and lemon  Sugar free cough drops   -fax cpap supplies order to pulmonary appt 08/26/21 should have been in fax stack current or prior   Dr. Halford Chessman +copd h/o asthma with allergies and PND can you address at appt with patient  There is nothing else PCP can do 07/24/21 did zpack, prednisone, on antihistamine, trelegy  Pt has f/u 08/26/21 with you  Needs cpap supplies they keep sending this to me ive asked my CMA to fax you all paperwork

## 2021-08-15 DIAGNOSIS — G4733 Obstructive sleep apnea (adult) (pediatric): Secondary | ICD-10-CM | POA: Diagnosis not present

## 2021-08-15 DIAGNOSIS — M542 Cervicalgia: Secondary | ICD-10-CM | POA: Diagnosis not present

## 2021-08-15 DIAGNOSIS — M25511 Pain in right shoulder: Secondary | ICD-10-CM | POA: Diagnosis not present

## 2021-08-15 DIAGNOSIS — I259 Chronic ischemic heart disease, unspecified: Secondary | ICD-10-CM | POA: Diagnosis not present

## 2021-08-15 DIAGNOSIS — M6281 Muscle weakness (generalized): Secondary | ICD-10-CM | POA: Diagnosis not present

## 2021-08-15 DIAGNOSIS — G4710 Hypersomnia, unspecified: Secondary | ICD-10-CM | POA: Diagnosis not present

## 2021-08-15 NOTE — Telephone Encounter (Signed)
Patient informed and verbalized understanding

## 2021-08-19 ENCOUNTER — Telehealth: Payer: Self-pay | Admitting: Internal Medicine

## 2021-08-19 NOTE — Telephone Encounter (Signed)
Rejection Reason - Other - pt seeing Neurosurgery and will schedule with Ortho if issue is not completely addressed" [name] said on Aug 16, 2021 11:22 AM  "Letter mailed" [name] said on Aug 13, 2021 11:50 AM  "Left msg to return call " [name] said on Aug 07, 2021 4:18 PM  "Left msg to return call" [name] said on Jul 26, 2021 11:00 AM  Msg from [facility] orthopedic surgery

## 2021-08-21 ENCOUNTER — Telehealth: Payer: Self-pay | Admitting: Internal Medicine

## 2021-08-21 NOTE — Telephone Encounter (Signed)
Called and informed them this will need to contact the Patient's pulmonologist office for the order.   Gave the provider's name, office name, office number, and fax number. They will update this and call their office for orders.

## 2021-08-21 NOTE — Telephone Encounter (Signed)
Roderick from [facility] called in regards to patient medical equipment. They sent over a fax on Jan 8th and is resending another fax today 1/25. The order is for CPAP medical supplies.  Haywood Lasso can be reached at [phone] or [phone] Fax: [phone]

## 2021-08-22 ENCOUNTER — Telehealth: Payer: Self-pay

## 2021-08-22 NOTE — Telephone Encounter (Signed)
Called and spoke to patient in regards to to CPAP machine patient states she never received it. Called and LVM to follow up on cpap order for brad at [facility]. Will try again later.

## 2021-08-24 DIAGNOSIS — J449 Chronic obstructive pulmonary disease, unspecified: Secondary | ICD-10-CM | POA: Diagnosis not present

## 2021-08-26 ENCOUNTER — Ambulatory Visit (INDEPENDENT_AMBULATORY_CARE_PROVIDER_SITE_OTHER): Payer: Medicare HMO | Admitting: Pulmonary Disease

## 2021-08-26 ENCOUNTER — Other Ambulatory Visit: Payer: Self-pay

## 2021-08-26 ENCOUNTER — Telehealth: Payer: Self-pay | Admitting: Neurology

## 2021-08-26 ENCOUNTER — Telehealth: Payer: Self-pay | Admitting: Pulmonary Disease

## 2021-08-26 ENCOUNTER — Encounter: Payer: Self-pay | Admitting: Pulmonary Disease

## 2021-08-26 VITALS — BP 138/70 | HR 73 | Temp 97.1°F | Ht 65.0 in | Wt 218.0 lb

## 2021-08-26 DIAGNOSIS — J320 Chronic maxillary sinusitis: Secondary | ICD-10-CM | POA: Diagnosis not present

## 2021-08-26 DIAGNOSIS — G4733 Obstructive sleep apnea (adult) (pediatric): Secondary | ICD-10-CM

## 2021-08-26 DIAGNOSIS — J449 Chronic obstructive pulmonary disease, unspecified: Secondary | ICD-10-CM | POA: Diagnosis not present

## 2021-08-26 MED ORDER — DOXYCYCLINE HYCLATE 100 MG PO TABS: 100.0000 mg | ORAL_TABLET | Freq: Two times a day (BID) | ORAL | 0 refills | Status: DC

## 2021-08-26 MED ORDER — PREDNISONE 10 MG PO TABS: ORAL_TABLET | ORAL | 0 refills | Status: AC

## 2021-08-26 NOTE — Telephone Encounter (Signed)
Per Dr. Jaynee Eagles: can be rescheduled with an NP in person or video unless she declines then can be rescheduled with me   I lvm for patient to call us back

## 2021-08-26 NOTE — Patient Instructions (Signed)
Doxycycline 100 mg twice per day for 7 days  Prednisone 10 mg pill >> 3 pills daily for 2 days, 2 pills daily for 2 days, 1 pill daily for 2 days  Follow up in 4 months

## 2021-08-26 NOTE — Progress Notes (Signed)
[facility]  Chief Complaint  Patient presents with   Follow-up    Constitutional:  BP 138/70 (BP Location: Left Arm, Patient Position: Sitting, Cuff Size: Normal)    Pulse 73    Temp (!) 97.1 F (36.2 C) (Oral)    Ht 5\' 5"  (1.651 m)    Wt 218 lb (98.9 kg)    SpO2 100%    BMI 36.28 kg/m   Past Medical History:  HLD, GERD, Depression, HTN, Anxiety, Depression, Allergies, OA, Pituitary adenoma, Combined CHF, CAD s/p CABG, DM type 2, HA, Hep B, Cervical cancer, PAD, Colon polyps  Past Surgical History:  Her  has a past surgical history that includes Coronary artery bypass graft (2012); Coronary stent placement (2003); Coronary stent placement (2007); Femoral artery stent (10/2010); Coronary angioplasty; Colonoscopy (2008); Colonoscopy with propofol (N/A, 02/06/2015); Esophagogastroduodenoscopy (egd) with propofol (N/A, 02/06/2015); Colonoscopy with propofol (N/A, 01/27/2017); Esophagogastroduodenoscopy (egd) with propofol (N/A, 01/27/2017); Tubal ligation; Knee arthroscopy with medial menisectomy (Right, 08/20/2017); Abdominal hysterectomy; Cholecystectomy (1986); Posterior cervical laminectomy (N/A, 03/08/2018); Tonsillectomy; Total knee arthroplasty (Right, 04/14/2019); and Esophagogastroduodenoscopy (egd) with propofol (N/A, 09/01/2019).  Brief Summary:  [name] is a 61 y.o. female with former smoker with allergic asthma and rhinitis, and obstructive sleep apnea.      Subjective:   She did home sleep study in September.  Showed mild sleep apnea.  She hasn't received her CPAP machine yet.  She was treated for an exacerbation in December 2022 with prednisone and a Z-pak.  CXR from 07/24/21 showed hyperinflation.  She felt better after therapy, but then her symptoms recurred.  Have been going on for about 4 weeks.  Has sinus pressure and congestion.  Blowing out yellow phlegm.  Drains in back of her throat and makes her cough and gag.  Not having  wheeze or chest pain.  Physical Exam:   Appearance - well kempt   ENMT - no sinus tenderness, no oral exudate, no LAN, Mallampati 4 airway, no stridor, boggy nasal mucosa, yellow nasal drainage, high arched palate  Respiratory - equal breath sounds bilaterally, no wheezing or rales  CV - s1s2 regular rate and rhythm, no murmurs  Ext - no clubbing, no edema  Skin - no rashes  Psych - normal mood and affect     Pulmonary testing:  PFT 11/07/10 >> FEV1 2.73 (110%), FEV1% 80, TLC 5.41 (101%), DLCO 68% PFT 05/16/16 >> FEV1 1.98 (88%), FEV1% 79, TLC 5.10 (98%), DLCO 63%  Chest Imaging:  CT angio chest 06/16/17 >> atherosclerosis, s/p CABG, ATX  Sleep Tests:  PSG 05/06/16 >> AHI 6.8, SpO2 low 87% HST 01/11/19 >> AHI 26.7, SpO2 low 62% HST 04/25/21 >> AHI 13.1, SpO2 low 85%  Cardiac Tests:  Echo 04/24/17 >> EF 40 to 45%, grade 1 DD  Social History:  She  reports that she quit smoking about 11 years ago. Her smoking use included cigarettes. She has a 38.00 pack-year smoking history. She has never used smokeless tobacco. She reports that she does not drink alcohol and does not use drugs.  Family History:  Her family history includes Acute myelogenous leukemia in her grandson; Arthritis in her sister; Brain cancer in her sister; Breast cancer (age of onset: 85) in her mother; Cancer in her father and mother; Colon cancer (age of onset: 31) in her father; Diabetes in her brother, father, and sister; Glaucoma in her father; Heart disease in her father; Heart failure in her father; Hypertension in  her brother, father, and sister; Kidney disease in her sister; Other in her father and sister.     Assessment/Plan:   Chronic sinusitis. - will give her course of doxycycline and prednisone - if she doesn't improve, then might need further sinus imaging studies and assessment by ENT  Allergic asthma. - continue trelegy 100 on puff daily and singulair 10 mg qhs - prn albutero  Allergic  rhinitis. - advised her to resume nasal irrigation - continue singulair, azelastine, flonase, and xyzal  Obstructive sleep apnea. - reviewed her sleep study results - she should be getting her auto CPAP set up through [facility] in March 2023 - she will be using auto CPAP 5 to 15 cm H2O  CAD s/p CABG, HTN, HLD, Ischemic cardiomyopathy with chronic combined CHF. - followed by Dr. Ida Rogue with [facility]  Pituitary tumor. - followed by Dr. Deetta Perla with neurosurgery  Time Spent Involved in Patient Care on Day of Examination:  37 minutes  Follow up:   Patient Instructions  Doxycycline 100 mg twice per day for 7 days  Prednisone 10 mg pill >> 3 pills daily for 2 days, 2 pills daily for 2 days, 1 pill daily for 2 days  Follow up in 4 months  Medication List:   Allergies as of 08/26/2021       Reactions   Latex Rash      Shellfish Allergy Anaphylaxis   Hard shellfish/swelling in throat   Sulfonamide Derivatives Anaphylaxis   Swelling in throat.   Watermelon [citrullus Vulgaris] Other (See Comments)   Throat itchy   Penicillins Itching   Soy Allergy    Diarrhea/upset stomach    Tomato Itching   Throat itchy - also Raw carrots   Other Swelling        Medication List        Accurate as of August 26, 2021 11:59 AM. If you have any questions, ask your nurse or doctor.          albuterol 108 (90 Base) MCG/ACT inhaler Commonly known as: VENTOLIN HFA Inhale 2 puffs into the lungs every 6 (six) hours as needed for wheezing or shortness of breath.   aspirin EC 81 MG tablet Take 1 tablet (81 mg total) by mouth daily.   Azelastine HCl 0.15 % Soln Place 2 sprays into both nostrils daily as needed (allergies).   benzonatate 100 MG capsule Commonly known as: TESSALON Take 1 capsule (100 mg total) by mouth 3 (three) times daily as needed for cough.   cyclobenzaprine 5 MG tablet Commonly known as: FLEXERIL Take 1-2 tablets (5-10 mg total) by mouth daily  as needed for muscle spasms.   dapagliflozin propanediol 5 MG Tabs tablet Commonly known as: Farxiga Take 1 tablet (5 mg total) by mouth daily before breakfast. DISCONTINUE METFORMIN   dicyclomine 10 MG capsule Commonly known as: Bentyl Take 1 capsule (10 mg total) by mouth 3 (three) times daily before meals. As needed for abdominal pain   doxycycline 100 MG tablet Commonly known as: VIBRA-TABS Take 1 tablet (100 mg total) by mouth 2 (two) times daily. Started by: Chesley Mires, MD   esomeprazole 40 MG capsule Commonly known as: NEXIUM Take 1 capsule (40 mg total) by mouth daily.   ezetimibe 10 MG tablet Commonly known as: ZETIA Take 1 tablet (10 mg total) by mouth daily.   fluticasone 50 MCG/ACT nasal spray Commonly known as: FLONASE Place 2 sprays into both nostrils daily as needed for allergies.  Max dose 2 sprays each nostril   furosemide 20 MG tablet Commonly known as: LASIX TAKE 1 TABLET BY MOUTH ONCE DAILY AND A SECOND TABLET IF NEEDED FOR FLUID BUILD UP   Gemtesa 75 MG Tabs Generic drug: Vibegron Take 75 mg by mouth daily. D/c myrbetriq   hydrOXYzine 25 MG tablet Commonly known as: ATARAX TAKE 1 TO 2 TABLETS BY MOUTH ONCE DAILY AS NEEDED   ipratropium 0.06 % nasal spray Commonly known as: ATROVENT Place 2 sprays into both nostrils 4 (four) times daily.   ipratropium-albuterol 0.5-2.5 (3) MG/3ML Soln Commonly known as: DUONEB Take 3 mLs by nebulization 2 (two) times daily as needed (wheezing/shortness of breath).   isosorbide mononitrate 30 MG 24 hr tablet Commonly known as: IMDUR Take 1 tablet by mouth twice daily   levocetirizine 5 MG tablet Commonly known as: Xyzal Take 1 tablet (5 mg total) by mouth at bedtime as needed for allergies.   linaclotide 72 MCG capsule Commonly known as: Linzess TAKE 1 CAPSULE BY MOUTH ONCE DAILY BEFORE BREAKFAST   losartan 25 MG tablet Commonly known as: COZAAR Take 0.5 tablets (12.5 mg total) by mouth daily.    meclizine 25 MG tablet Commonly known as: ANTIVERT TAKE 1 TABLET BY MOUTH TWICE DAILY AS NEEDED FOR DIZZINESS   metoprolol succinate 50 MG 24 hr tablet Commonly known as: TOPROL-XL TAKE 1 TABLET BY MOUTH ONCE DAILY WITH OR IMMEDIATELY FOLLOWING A MEAL   montelukast 10 MG tablet Commonly known as: SINGULAIR Take 1 tablet (10 mg total) by mouth at bedtime.   NEEDLE (DISP) 25 G 25G X 1-1/2" Misc 1 Device by Does not apply route every 30 (thirty) days.   nitroGLYCERIN 0.4 MG SL tablet Commonly known as: NITROSTAT DISSOLVE ONE TABLET UNDER THE TONGUE EVERY 5 MINUTES AS NEEDED FOR CHEST PAIN.  DO NOT EXCEED A TOTAL OF 3 DOSES IN 15 MINUTES   nystatin 100000 UNIT/ML suspension Commonly known as: MYCOSTATIN Take 5 mLs (500,000 Units total) by mouth 4 (four) times daily.   olopatadine 0.1 % ophthalmic solution Commonly known as: PATANOL Place 1 drop into both eyes 2 (two) times daily.   ondansetron 4 MG tablet Commonly known as: Zofran Take 1 tablet (4 mg total) by mouth every 8 (eight) hours as needed for nausea or vomiting.   OneTouch Delica Plus [id number] Misc 1 Device by Does not apply route in the morning and at bedtime.   OneTouch Verio test strip Generic drug: glucose blood USE AS DIRECTED   PARoxetine 20 MG tablet Commonly known as: PAXIL Take 1 tablet by mouth once daily   potassium chloride 10 MEQ tablet Commonly known as: KLOR-CON M Take 1 tablet (10 mEq total) by mouth daily.   predniSONE 10 MG tablet Commonly known as: DELTASONE Take 3 tablets (30 mg total) by mouth daily with breakfast for 2 days, THEN 2 tablets (20 mg total) daily with breakfast for 2 days, THEN 1 tablet (10 mg total) daily with breakfast for 2 days. Start taking on: August 26, 2021 What changed:  medication strength See the new instructions. Changed by: Chesley Mires, MD   pregabalin 75 MG capsule Commonly known as: LYRICA Take 1 capsule (75 mg total) by mouth 2 (two) times daily.    Repatha SureClick 188 MG/ML Soaj Generic drug: Evolocumab INJECT THE CONTENTS OF 1 PEN INTO THE SKIN ONCE EVERY 14 DAYS PLEASE SCHEDULE OFFICE VISIT FOR FURTHER REFILLS. THANK YOU!   rosuvastatin 20 MG tablet Commonly known as: CRESTOR  Take 1 tablet (20 mg total) by mouth at bedtime.   sodium chloride 0.65 % Soln nasal spray Commonly known as: OCEAN Place 2 sprays into both nostrils daily as needed for congestion.   traZODone 50 MG tablet Commonly known as: DESYREL Take 0.5-1 tablets (25-50 mg total) by mouth at bedtime as needed for sleep.   Trelegy Ellipta 100-62.5-25 MCG/ACT Aepb Generic drug: Fluticasone-Umeclidin-Vilant INHALE 1 PUFF ONCE DAILY   Vitamin B-12 1000 MCG Subl Place 0.001 tablets (1 mcg total) under the tongue daily.   Xarelto 2.5 MG Tabs tablet Generic drug: rivaroxaban Take 1 tablet by mouth twice daily        Signature:  Chesley Mires, MD [facility] Pager - [phone] 08/26/2021, 11:59 AM

## 2021-08-27 ENCOUNTER — Ambulatory Visit: Payer: Medicare HMO | Admitting: Neurology

## 2021-09-03 DIAGNOSIS — G8929 Other chronic pain: Secondary | ICD-10-CM | POA: Diagnosis not present

## 2021-09-03 DIAGNOSIS — M542 Cervicalgia: Secondary | ICD-10-CM | POA: Diagnosis not present

## 2021-09-03 DIAGNOSIS — M25511 Pain in right shoulder: Secondary | ICD-10-CM | POA: Diagnosis not present

## 2021-09-03 DIAGNOSIS — M5412 Radiculopathy, cervical region: Secondary | ICD-10-CM | POA: Diagnosis not present

## 2021-09-04 ENCOUNTER — Encounter: Payer: Self-pay | Admitting: Adult Health

## 2021-09-04 ENCOUNTER — Ambulatory Visit (INDEPENDENT_AMBULATORY_CARE_PROVIDER_SITE_OTHER): Payer: Medicare HMO | Admitting: Adult Health

## 2021-09-04 ENCOUNTER — Telehealth: Payer: Self-pay | Admitting: Internal Medicine

## 2021-09-04 VITALS — BP 114/81 | HR 73 | Ht 65.0 in | Wt 219.0 lb

## 2021-09-04 DIAGNOSIS — N6452 Nipple discharge: Secondary | ICD-10-CM | POA: Diagnosis not present

## 2021-09-04 DIAGNOSIS — D497 Neoplasm of unspecified behavior of endocrine glands and other parts of nervous system: Secondary | ICD-10-CM | POA: Diagnosis not present

## 2021-09-04 NOTE — Telephone Encounter (Signed)
Pt called in requesting referral for kidney specialist. Pt state that she would like to see Dr. Lavonia Dana. Pt requesting callback

## 2021-09-04 NOTE — Telephone Encounter (Signed)
Referral placed.

## 2021-09-04 NOTE — Progress Notes (Signed)
[facility]    Provider:  Dr Jaynee Eagles Requesting Provider: [name], Olivia Mackie * Primary Care Provider:  [name], [name], MD  CC:  pituitary tumor  HPI:   Update 09/04/2021 JM: Returns for follow-up visit after initial follow-up visit with Dr. Jaynee Eagles 5 months ago. Completed lab work at prior visit which was mostly normal except prolactin high-normal range and recommended endocrinology follow-up but not yet seen by them.  Routinely followed by neurosurgery Dr. Zada Finders with serial MRIs and routine visual field examinations with ophthalmology, per recent neurosurg note, both have been stable.  Overall stable since prior visit - does report fluctuation of vision such as when trying to read words at further distance, it can be somewhat blurred but this is not new and specifically denies changes in peripheral vision, per prior OV notes, does have some cataracts. Has appointment with ophthalmology at [facility] in June - has not been seen since early of 2022.  No further concerns at this time.     Consult visit 04/16/2021 Dr. Jaynee Eagles:  [name] is a 61 y.o. 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   She has a follow up with ophthalmology, she follows with them  for her tumor but vision is stable, she does not remember which surgeon she saw last year, unsure she saw one neurosurgeon but hasn't followed up will refer to [place] neurosurgery for follow up. She has been having drainage from nipples for several years, discussed prolactin tumors, still coming out, will check some bloodwork and also have endocrinology take a look to see if this tumor is treatable. No vision changes. No significant headache, she has floaters in her yes but follow  with ophthalmology, no headaches, no new symptoms, she has some sinus issues, she just wants to know why and what the plan is. No other focal neurologic deficits, associated symptoms, inciting events or modifiable factors.  Reviewed notes, labs and imaging from outside physicians, which showed:  01/2021:Pituitary mass reviewed mri images and agree with following:   lesion is present on the right, unchanged in size. The mass shows homogeneous enhancement and displaces the infundibulum to the left.There is no compression of the optic chiasm. No extension into the cavernous sinus. The pituitary mass measures approximately 15 x 13 x 12 mm, unchanged.Pituitary tumor on the right is unchanged in size compatible with macro adenoma. No compression optic chiasm and no cavernous sinus invasion.     Review of Systems: Patient complains of symptoms per HPI as well as the following symptoms fatigue, nipple discharge. Pertinent negatives and positives per HPI. All others negative.   Social History   Socioeconomic History   Marital status: Legally Separated    Spouse name: Not on file   Number of children: Not on file   Years of education: Not on file   Highest education level: Not on file  Occupational History   Not on file  Tobacco Use   Smoking status: Former    Packs/day: 1.00    Years: 38.00    Pack years: 38.00    Types: Cigarettes    Quit date: 08/12/2010    Years since quitting: 11.0   Smokeless tobacco: Never  Vaping Use   Vaping Use: Never used  Substance and Sexual Activity   Alcohol use: No   Drug use: No  Types: Cocaine    Comment: Remote Hx (crack cocaine and marijuana).none since 30yrs plus   Sexual activity: Yes  Other Topics Concern   Not on file  Social History Narrative   Caffeine: 1 cup coffee/day   Lives with family, no pets   Occupation: industrial work, 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   Edu: 11th grade   Activity: no regular exercise   Diet: good water,  vegetables daily, low salt diet   Lives with sister and other family    No guns, wears seat belts, safe in relationship    2 kids    GED 1 year of college    Social Determinants of Health   Financial Resource Strain: Low Risk    Difficulty of Paying Living Expenses: Not hard at all  Food Insecurity: No Food Insecurity   Worried About Charity fundraiser in the Last Year: Never true   Arboriculturist in the Last Year: Never true  Transportation Needs: No Transportation Needs   Lack of Transportation (Medical): No   Lack of Transportation (Non-Medical): No  Physical Activity: Not on file  Stress: No Stress Concern Present   Feeling of Stress : Not at all  Social Connections: Unknown   Frequency of Communication with Friends and Family: More than three times a week   Frequency of Social Gatherings with Friends and Family: Once a week   Attends Religious Services: 1 to 4 times per year   Active Member of [employer] or Organizations: Yes   Attends Music therapist: More than 4 times per year   Marital Status: Not on file  Intimate Partner Violence: Not At Risk   Fear of Current or Ex-Partner: No   Emotionally Abused: No   Physically Abused: No   Sexually Abused: No    Family History  Problem Relation Age of Onset   Hypertension Father    Heart failure Father    Diabetes Father    Colon cancer Father 94   Glaucoma Father    Cancer Father        colorectal    Heart disease Father    Other Father        glaucoma   Breast cancer Mother 45       breast cancer, late 20's   Cancer Mother        breast   Brain cancer Sister    Arthritis Sister    Diabetes Sister    Hypertension Sister    Kidney disease Sister    Diabetes Brother    Hypertension Brother    Other Sister        brain tumor    Acute myelogenous leukemia Grandson        06/2019   Coronary artery disease Neg Hx    Stroke Neg Hx     Past Medical History:  Diagnosis Date   Allergy    Anemia     Arthritis    Asthma    Bacterial vaginitis    Blood in stool    Brachial neuritis or radiculitis NOS    Brain tumor (benign) ([place]) 07/2017   near optic nerve. being followed by neurosurgery/eye doctor and pcp. monitoring size.causes sinus problems   Bronchitis    Cardiac arrhythmia    Cervical neck pain with evidence of disc disease    C5/6 disease, MRI done late 2012 - no records available   Chronic combined systolic and diastolic CHF (congestive heart failure) ([place])  a. 08/2010 Echo: mildly reduced EF 40-45%, mild diffuse hypokinesis; b. 06/2016 Echo: EF 50%, no rwma, mild to mod TR; c. 03/2017 Echo: EF 40-45%, Gr1 DD (prior echo reviewed and EF felt to be lower than reported).   Chronic pain    Cocaine abuse, in remission (HCC)    clean x 24 years   Colon polyps    COPD (chronic obstructive pulmonary disease) ([place])    a. 12/2018 PFT: No obvious obst/restrictive dzs.   Coronary artery disease    a. PCI of LCX 2003; b. PCI of the LAD 2012 with a (2.5 x 8 mm BMS);  c.s/p CABG 4/12: L-LAD, VG-Dx, VG-OM, VG-RCA (Dr. Prescott Gum);  d. 01/2012 MV: inf infarct, attenuation, no ischemia; e. 02/2017 MV: signifi attenuation artifact. Fixed basal antlat/inflat scar vs artifact. Reversible apical lat and mid antlat defect - ? atten vs ischemia. F/u echo w/o wma->Med rx.   Depression    Diabetes mellitus without complication ([place])    Family history of colon cancer    Generalized headaches    frequent   GERD (gastroesophageal reflux disease)    Headache    Heart murmur    Hematochezia    Hepatitis    history of hepatitis b   History of cervical cancer    s/p cryotherapy   History of drug abuse ([place])    cocaine, marijuana, clean since 1989   History of hepatitis B    from eating undercooked liver   History of MI (myocardial infarction)    Hyperlipidemia    Hypertension    Ischemic cardiomyopathy    a. 03/2017 Echo: EF 40-45%, Gr1 DD.   Myocardial infarction ([place]) 2003, 2012   PAD (peripheral  artery disease) ([place])    a. s/p Right SFA atherectomy and PTA 01/15/11; b. 07/2018 ABI: R 1.02, L 1.09.   Pituitary mass ([place])    a. 12/2018 MRI Brain: Stable pituitary mass w/ 34mm area of necrosis. Mass abuts R cavernous sinus w/o definite invasion.   Polyp of colon    Seasonal allergies    Sleep apnea    uses cpap   Smoking history    quit 07/2010   Thyroid disease    Urinary incontinence     Patient Active Problem List   Diagnosis Date Noted   PND (post-nasal drip) 07/24/2021   Chronic pain of inguinal region 07/24/2021   Thrush 04/19/2021   Skin lesion 04/19/2021   Vaginal itching 04/19/2021   Vitamin D deficiency 01/14/2021   Breast mass, right 01/14/2021   Annual physical exam 01/14/2021   Pruritus of both eyes 09/21/2020   Lipoma of right lower extremity 08/30/2020   Hypertension associated with diabetes ([place]) 07/24/2020   Abnormality of both breasts on screening mammogram 05/14/2020   Bilateral hip pain 04/19/2020   Insomnia 04/19/2020   Chronic low back pain 02/22/2020   Internal hemorrhoids 02/22/2020   Benign breast cyst in female, right 02/22/2020   OSA on CPAP 12/15/2019   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[place]) 12/02/2019   Sensorimotor neuropathy 12/02/2019   Irritable bowel syndrome with both constipation and diarrhea 10/13/2019   Mild intermittent asthma 10/13/2019   PUD (peptic ulcer disease)    Status post total knee replacement using cement, right 04/14/2019   Overactive bladder 03/10/2019   Mild persistent asthma 11/29/2018   DDD (degenerative disc disease), cervical 11/26/2018   Chronic constipation 11/26/2018   Fatty liver 11/26/2018   Adrenal adenoma, left 11/26/2018   Allergic rhinitis 08/26/2018   Bunion  of right foot 08/26/2018   Fall 05/14/2018   Anxiety and depression 04/05/2018   Cervical radiculopathy 02/24/2018   Numbness and tingling in right hand 02/24/2018   Chronic neck pain 12/18/2017   Type 2 diabetes mellitus without complication, without long-term  current use of insulin ([place]) 12/18/2017   Urinary incontinence 12/18/2017   Lumbar radiculopathy 12/18/2017   Pituitary macroadenoma ([place]) 03/25/2017   Abnormal feces    Benign neoplasm of ascending colon    Intractable vomiting with nausea    Acute esophagogastric ulcer    Gastritis without bleeding    Vasomotor flushing 11/24/2016   Morbid obesity ([place]) 10/29/2015   Back ache 06/19/2015   Colon polyp 06/19/2015   CCF (congestive cardiac failure) ([place]) 06/19/2015   Family history of colon cancer 06/19/2015   Orthostatic hypotension 04/30/2015   Hematochezia 01/11/2015   History of colonic polyps 01/11/2015   Atypical chest pain 01/20/2014   COPD with chronic bronchitis and emphysema ([place]) 01/03/2013   Right knee pain 12/20/2011   HTN (hypertension) 33/35/4562   Systolic CHF, chronic (HCC)    History of cervical cancer    Depression    GERD (gastroesophageal reflux disease)    Seasonal allergies    History of drug abuse ([place])    PVD (peripheral vascular disease) ([place]) 02/03/2011   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   Smoking history 12/16/2010   Hyperlipidemia 08/23/2010   Coronary artery disease involving native coronary artery of native heart without angina pectoris 08/23/2010   CLAUDICATION 08/23/2010   CAROTID BRUIT 08/23/2010   SHORTNESS OF BREATH 08/23/2010    Past Surgical History:  Procedure Laterality Date   ABDOMINAL HYSTERECTOMY     2003   CHOLECYSTECTOMY  1986   COLONOSCOPY  2008   3 polyps   COLONOSCOPY WITH PROPOFOL N/A 02/06/2015   Procedure: COLONOSCOPY WITH PROPOFOL;  Surgeon: Lucilla Lame, MD;  Location: [facility] ENDOSCOPY;  Service: Endoscopy;  Laterality: N/A;   COLONOSCOPY WITH PROPOFOL N/A 01/27/2017   Procedure: COLONOSCOPY WITH PROPOFOL;  Surgeon: Lucilla Lame, MD;  Location: [facility] ENDOSCOPY;  Service: Endoscopy;  Laterality: N/A;   CORONARY ANGIOPLASTY     w/ stent placement x2   CORONARY ARTERY BYPASS GRAFT  2012   Dr [name]   CORONARY STENT PLACEMENT  2003    S/P MI   CORONARY STENT PLACEMENT  2007   [place]   ESOPHAGOGASTRODUODENOSCOPY (EGD) WITH PROPOFOL N/A 02/06/2015   Procedure: ESOPHAGOGASTRODUODENOSCOPY (EGD) WITH PROPOFOL;  Surgeon: Lucilla Lame, MD;  Location: [facility] ENDOSCOPY;  Service: Endoscopy;  Laterality: N/A;   ESOPHAGOGASTRODUODENOSCOPY (EGD) WITH PROPOFOL N/A 01/27/2017   Procedure: ESOPHAGOGASTRODUODENOSCOPY (EGD) WITH PROPOFOL;  Surgeon: Lucilla Lame, MD;  Location: [facility] ENDOSCOPY;  Service: Endoscopy;  Laterality: N/A;   ESOPHAGOGASTRODUODENOSCOPY (EGD) WITH PROPOFOL N/A 09/01/2019   Procedure: ESOPHAGOGASTRODUODENOSCOPY (EGD) WITH PROPOFOL;  Surgeon: Lin Landsman, MD;  Location: [facility];  Service: Gastroenterology;  Laterality: N/A;   FEMORAL ARTERY STENT  10/2010   right sided (Dr. Burt Knack)   KNEE ARTHROSCOPY WITH MEDIAL MENISECTOMY Right 08/20/2017   Procedure: KNEE ARTHROSCOPY WITH MEDIAL  AND LATERAL MENISECTOMY;  Surgeon: Hessie Knows, MD;  Location: [facility] ORS;  Service: Orthopedics;  Laterality: Right;   POSTERIOR CERVICAL LAMINECTOMY N/A 03/08/2018   Procedure: POSTERIOR CERVICAL LAMINECTOMY-C7;  Surgeon: Deetta Perla, MD;  Location: [facility] ORS;  Service: Neurosurgery;  Laterality: N/A;   TONSILLECTOMY     TOTAL KNEE ARTHROPLASTY Right 04/14/2019   Procedure: RIGHT TOTAL KNEE ARTHROPLASTY;  Surgeon: Hessie Knows, MD;  Location:  [facility] ORS;  Service: Orthopedics;  Laterality: Right;   TUBAL LIGATION      Current Outpatient Medications  Medication Sig Dispense Refill   albuterol (VENTOLIN HFA) 108 (90 Base) MCG/ACT inhaler Inhale 2 puffs into the lungs every 6 (six) hours as needed for wheezing or shortness of breath. 1 g 12   aspirin EC 81 MG tablet Take 1 tablet (81 mg total) by mouth daily. 90 tablet 3   Azelastine HCl 0.15 % SOLN Place 2 sprays into both nostrils daily as needed (allergies). 30 mL 11   benzonatate (TESSALON) 100 MG capsule Take 1 capsule (100 mg total) by mouth 3 (three) times daily as needed for  cough. 60 capsule 0   Cyanocobalamin (VITAMIN B-12) 1000 MCG SUBL Place 0.001 tablets (1 mcg total) under the tongue daily. 90 tablet 3   cyclobenzaprine (FLEXERIL) 5 MG tablet Take 1-2 tablets (5-10 mg total) by mouth daily as needed for muscle spasms. 60 tablet 11   dapagliflozin propanediol (FARXIGA) 5 MG TABS tablet Take 1 tablet (5 mg total) by mouth daily before breakfast. DISCONTINUE METFORMIN 90 tablet 1   dicyclomine (BENTYL) 10 MG capsule Take 1 capsule (10 mg total) by mouth 3 (three) times daily before meals. As needed for abdominal pain 120 capsule 11   doxycycline (VIBRA-TABS) 100 MG tablet Take 1 tablet (100 mg total) by mouth 2 (two) times daily. 14 tablet 0   esomeprazole (NEXIUM) 40 MG capsule Take 1 capsule (40 mg total) by mouth daily. 90 capsule 3   Evolocumab (REPATHA SURECLICK) 209 MG/ML SOAJ INJECT THE CONTENTS OF 1 PEN INTO THE SKIN ONCE EVERY 14 DAYS PLEASE SCHEDULE OFFICE VISIT FOR FURTHER REFILLS. THANK YOU! 2 mL 0   ezetimibe (ZETIA) 10 MG tablet Take 1 tablet (10 mg total) by mouth daily. 90 tablet 3   fluticasone (FLONASE) 50 MCG/ACT nasal spray Place 2 sprays into both nostrils daily as needed for allergies. Max dose 2 sprays each nostril 48 g 3   Fluticasone-Umeclidin-Vilant (TRELEGY ELLIPTA) 100-62.5-25 MCG/INH AEPB INHALE 1 PUFF ONCE DAILY 60 each 11   furosemide (LASIX) 20 MG tablet TAKE 1 TABLET BY MOUTH ONCE DAILY AND A SECOND TABLET IF NEEDED FOR FLUID BUILD UP 180 tablet 0   hydrOXYzine (ATARAX/VISTARIL) 25 MG tablet TAKE 1 TO 2 TABLETS BY MOUTH ONCE DAILY AS NEEDED 60 tablet 11   ipratropium (ATROVENT) 0.06 % nasal spray Place 2 sprays into both nostrils 4 (four) times daily. 15 mL 12   ipratropium-albuterol (DUONEB) 0.5-2.5 (3) MG/3ML SOLN Take 3 mLs by nebulization 2 (two) times daily as needed (wheezing/shortness of breath). 360 mL 11   isosorbide mononitrate (IMDUR) 30 MG 24 hr tablet Take 1 tablet by mouth twice daily 180 tablet 0   Lancets (ONETOUCH  DELICA PLUS [id number]) MISC 1 Device by Does not apply route in the morning and at bedtime. 180 each 3   levocetirizine (XYZAL) 5 MG tablet Take 1 tablet (5 mg total) by mouth at bedtime as needed for allergies. 90 tablet 3   linaclotide (LINZESS) 72 MCG capsule TAKE 1 CAPSULE BY MOUTH ONCE DAILY BEFORE BREAKFAST 90 capsule 3   meclizine (ANTIVERT) 25 MG tablet TAKE 1 TABLET BY MOUTH TWICE DAILY AS NEEDED FOR DIZZINESS 60 tablet 11   metoprolol succinate (TOPROL-XL) 50 MG 24 hr tablet TAKE 1 TABLET BY MOUTH ONCE DAILY WITH OR IMMEDIATELY FOLLOWING A MEAL 90 tablet 3   montelukast (SINGULAIR) 10 MG tablet Take 1 tablet (10 mg total)  by mouth at bedtime. 90 tablet 3   nitroGLYCERIN (NITROSTAT) 0.4 MG SL tablet DISSOLVE ONE TABLET UNDER THE TONGUE EVERY 5 MINUTES AS NEEDED FOR CHEST PAIN.  DO NOT EXCEED A TOTAL OF 3 DOSES IN 15 MINUTES 25 tablet 0   nystatin (MYCOSTATIN) 100000 UNIT/ML suspension Take 5 mLs (500,000 Units total) by mouth 4 (four) times daily. 240 mL 0   olopatadine (PATANOL) 0.1 % ophthalmic solution Place 1 drop into both eyes 2 (two) times daily. 15 mL 3   ondansetron (ZOFRAN) 4 MG tablet Take 1 tablet (4 mg total) by mouth every 8 (eight) hours as needed for nausea or vomiting. 20 tablet 5   ONETOUCH VERIO test strip USE AS DIRECTED 200 each 3   PARoxetine (PAXIL) 20 MG tablet Take 1 tablet by mouth once daily 90 tablet 1   potassium chloride (KLOR-CON) 10 MEQ tablet Take 1 tablet (10 mEq total) by mouth daily. 90 tablet 3   pregabalin (LYRICA) 75 MG capsule Take 1 capsule (75 mg total) by mouth 2 (two) times daily. 60 capsule 5   rivaroxaban (XARELTO) 2.5 MG TABS tablet Take 1 tablet by mouth twice daily 180 tablet 1   rosuvastatin (CRESTOR) 20 MG tablet Take 1 tablet (20 mg total) by mouth at bedtime. 90 tablet 3   sodium chloride (OCEAN) 0.65 % SOLN nasal spray Place 2 sprays into both nostrils daily as needed for congestion. 30 mL 11   traZODone (DESYREL) 50 MG tablet Take 0.5-1  tablets (25-50 mg total) by mouth at bedtime as needed for sleep. 90 tablet 3   Vibegron (GEMTESA) 75 MG TABS Take 75 mg by mouth daily. D/c myrbetriq 90 tablet 3   losartan (COZAAR) 25 MG tablet Take 0.5 tablets (12.5 mg total) by mouth daily. 45 tablet 3   No current facility-administered medications for this visit.    Allergies as of 09/04/2021 - Review Complete 09/04/2021  Allergen Reaction Noted   Latex Rash 08/23/2010   Shellfish allergy Anaphylaxis    Sulfonamide derivatives Anaphylaxis 08/23/2010   Watermelon [citrullus vulgaris] Other (See Comments) 04/13/2017   Penicillins Itching 08/26/2021   Soy allergy  03/10/2019   Tomato Itching 09/11/2020   Other Swelling 01/20/2012    Vitals: BP 114/81    Pulse 73    Ht 5\' 5"  (1.651 m)    Wt 219 lb (99.3 kg)    BMI 36.44 kg/m  Last Weight:  Wt Readings from Last 1 Encounters:  09/04/21 219 lb (99.3 kg)   Last Height:   Ht Readings from Last 1 Encounters:  09/04/21 5\' 5"  (1.651 m)    Physical exam: Exam: Gen: NAD, conversant, very pleasant middle-age African-American female, well nourised, obese, well groomed                     CV: RRR, no MRG. No Carotid Bruits. No peripheral edema, warm, nontender Eyes: Conjunctivae clear without exudates or hemorrhage  Neuro: Detailed Neurologic Exam  Speech:    Speech is normal; fluent and spontaneous with normal comprehension.  Cognition:    The patient is oriented to person, place, and time;     recent and remote memory intact;     language fluent;     normal attention, concentration,     fund of knowledge Cranial Nerves:    The pupils are equal, round, and reactive to light. Difficulty due to small pupils but no papilledema appreciated Visual fields are full to finger confrontation. Extraocular movements  are intact. Trigeminal sensation is intact and the muscles of mastication are normal. The face is symmetric. The palate elevates in the midline. Hearing intact. Voice is  normal. Shoulder shrug is normal. The tongue has normal motion without fasciculations.   Coordination:    Normal   Gait:    normal.   Motor Observation:    No asymmetry, no atrophy, and no involuntary movements noted. Tone:    Normal muscle tone.    Posture:    Posture is normal. normal erect    Strength:    Strength is V/V in the upper and lower limbs.      Sensation: intact to LT     Reflex Exam:  DTR's:    Deep tendon reflexes in the upper and lower extremities are symmetrical  bilaterally.   Toes:    The toes are equiv bilaterally.   Clonus:    Clonus is absent.    Assessment/Plan:  [name] is a 61 y.o. female followed by Dr. Jaynee Eagles for pituitary macroadenoma and drainage from her nipples with slightly elevated prolactin level otherwise lab work unremarkable.  Tumor is stable on repeat imaging, the mass shows homogeneous enhancement and displaces the infundibulum to the left. There is no compression of the optic chiasm. No extension into the cavernous sinus.      -needs f/u with endocrinology - provided office number to schedule initial eval - following with ophthalmology - has an appointment in June - advised to call to see if sooner visit available as she has not been seen in over 1 year -Continue routine follow-up with neurosurgery -No further recommendations from a neurology standpoint as being followed appropriately for pituitary macroadenoma. No need for additional follow up visits. Discussed with Dr. Jaynee Eagles who agrees with plan   - Return if symptoms worsen or fail to improve.     CC:  [name], [name], MD   I spent 24 minutes of face-to-face and non-face-to-face time with patient.  This included previsit chart review, lab review, study review, electronic health record documentation, patient education and discussion regarding pituitary macroadenoma, recent lab work, above topics and answered all the questions to patient and sisters  satisfaction  Frann Rider, [facility]  [facility] [address] [place], [place] [address]  Phone [phone] Fax [phone] Note: This document was prepared with digital dictation and possible smart phrase technology. Any transcriptional errors that result from this process are unintentional.

## 2021-09-04 NOTE — Addendum Note (Signed)
Addended by: Orland Mustard on: 09/04/2021 01:16 PM   Modules accepted: Orders

## 2021-09-04 NOTE — Patient Instructions (Addendum)
Your Plan:  Schedule appointment with endocrinology - you can call them at [phone]  Try to schedule a sooner visit with your eye doctor due to visual concerns   Continue routine follow up with neurosurgery        Thank you for coming to see [place] at [facility]. I hope we have been able to provide you high quality care today.  You may receive a patient satisfaction survey over the next few weeks. We would appreciate your feedback and comments so that we may continue to improve ourselves and the health of our patients.

## 2021-09-04 NOTE — Telephone Encounter (Signed)
Okay for referral?

## 2021-09-06 ENCOUNTER — Ambulatory Visit (INDEPENDENT_AMBULATORY_CARE_PROVIDER_SITE_OTHER): Payer: Medicare HMO | Admitting: Cardiovascular Disease

## 2021-09-06 ENCOUNTER — Other Ambulatory Visit: Payer: Self-pay | Admitting: Neurosurgery

## 2021-09-06 ENCOUNTER — Other Ambulatory Visit: Payer: Self-pay

## 2021-09-06 DIAGNOSIS — I739 Peripheral vascular disease, unspecified: Secondary | ICD-10-CM | POA: Diagnosis not present

## 2021-09-06 DIAGNOSIS — M542 Cervicalgia: Secondary | ICD-10-CM

## 2021-09-06 DIAGNOSIS — I5042 Chronic combined systolic (congestive) and diastolic (congestive) heart failure: Secondary | ICD-10-CM | POA: Diagnosis not present

## 2021-09-06 DIAGNOSIS — I255 Ischemic cardiomyopathy: Secondary | ICD-10-CM | POA: Diagnosis not present

## 2021-09-06 DIAGNOSIS — I208 Other forms of angina pectoris: Secondary | ICD-10-CM | POA: Diagnosis not present

## 2021-09-06 DIAGNOSIS — I25118 Atherosclerotic heart disease of native coronary artery with other forms of angina pectoris: Secondary | ICD-10-CM

## 2021-09-06 DIAGNOSIS — I10 Essential (primary) hypertension: Secondary | ICD-10-CM | POA: Diagnosis not present

## 2021-09-06 DIAGNOSIS — M5412 Radiculopathy, cervical region: Secondary | ICD-10-CM

## 2021-09-06 DIAGNOSIS — E785 Hyperlipidemia, unspecified: Secondary | ICD-10-CM | POA: Diagnosis not present

## 2021-09-06 NOTE — Patient Instructions (Addendum)
Medication Instructions:  No changes  If you need a refill on your cardiac medications before your next appointment, please call your pharmacy.   Lab work: No new labs needed  Testing/Procedures: (we will call with results) Echo  Lexiscan myoview (stress test)  Follow-Up: At [facility], you and your health needs are our priority.  As part of our continuing mission to provide you with exceptional heart care, we have created designated Provider Care Teams.  These Care Teams include your primary Cardiologist (physician) and Advanced Practice Providers (APPs -  Physician Assistants and Nurse Practitioners) who all work together to provide you with the care you need, when you need it.  You will need a follow up appointment in 12 months  Providers on your designated Care Team:   Murray Hodgkins, NP Christell Faith, PA-C Cadence Kathlen Mody, [place]  COVID-19 Vaccine Information can be found at: [email or web address] For questions related to vaccine distribution or appointments, please email [email or web address] or call [phone].   Your physician has requested that you have an echocardiogram. Echocardiography is a painless test that uses sound waves to create images of your heart. It provides your doctor with information about the size and shape of your heart and how well your hearts chambers and valves are working. This procedure takes approximately one hour. There are no restrictions for this procedure.  There is a possibility that an IV may need to be started during your test to inject an image enhancing agent. This is done to obtain more optimal pictures of your heart. Therefore we ask that you do at least drink some water prior to coming in to hydrate your veins.   [facility] MYOVIEW Veterinary surgeon)  Your caregiver has ordered a Stress Test with nuclear imaging. The purpose of this test is to evaluate the blood supply to your heart muscle. This  procedure is referred to as a "Non-Invasive Stress Test." This is because other than having an IV started in your vein, nothing is inserted or "invades" your body. Cardiac stress tests are done to find areas of poor blood flow to the heart by determining the extent of coronary artery disease (CAD). Some patients exercise on a treadmill, which naturally increases the blood flow to your heart, while others who are  unable to walk on a treadmill due to physical limitations have a pharmacologic/chemical stress agent called Lexiscan . This medicine will mimic walking on a treadmill by temporarily increasing your coronary blood flow.   Please note: these test may take anywhere between 2-4 hours to complete  PLEASE REPORT TO [place] TO GO  Instructions regarding medication:   _X___ : Hold diabetes medication morning of procedure Do take insulin in the morning   _X___:  Hold other medications as follows: Lasix May take after test  How to prepare for your Myoview test:  Do not eat or drink after midnight No caffeine for 24 hours prior to test No smoking 24 hours prior to test. ALL your medication may be taken with a few sips of water.   (Except for the meds mention above) Please wear a short sleeve shirt. Comfortable pants are appropriate. No perfume, cologne or lotion. Wear comfortable walking shoes.   PLEASE NOTIFY THE OFFICE AT LEAST 58 HOURS IN ADVANCE IF YOU ARE UNABLE TO KEEP YOUR APPOINTMENT.  [phone] AND  PLEASE NOTIFY NUCLEAR MEDICINE AT [facility] AT LEAST 24 HOURS IN ADVANCE IF YOU ARE UNABLE TO  KEEP YOUR APPOINTMENT. [phone]

## 2021-09-06 NOTE — Progress Notes (Addendum)
Cardiology Office Note  Date:  09/06/2021   ID:  [name], DOB November 17, 1960, MRN [id number]  PCP:  [name], [name], MD   Cc:  HPI:  61 yo woman with a  long smoking history,   CAD s/p  NSTEMI at [facility]  1/12, with BMS to pLAD (80% stenosis),  residual severe stenosis of the RCA/nondominant small vessel,  continued chest pain,  cardiac catheterization showing distal left main stenosis involving a high grade ostial LAD stenosis and circumflex disease approximately 90%, 90% diagonal disease, 80% nondominant RCA disease with ejection fraction 45%,  CABG x 4 in 2012 with a LIMA to the LAD, vein graft to the diagonal, vein graft to the marginal and vein graft to the RCA,   peripheral vascular disease,  S/p atherectomy and PTA of her right SFA on 01/15/11,   outpatient stress test  July 2013 , OSA who presents for routine followup of her coronary artery disease  LOV 7/22  In follow-up today, concerned about several issues Neck issues, Pain down her left arm into her fingers  Into neck on left Little bit on right neck Prefers no cortisone shots in her neck, scared Some chest discomfort at times, shortness of breath Frozen shoulder on right  Reviewed prior echocardiogram and stress test from 2018 No further studies since that time  No regular exercise program  Lab work reviewed A1c 6.6 Total cholesterol 133 LDL 56  EKG personally reviewed by myself on todays visit Shows normal sinus rhythm with rate 63 bpm T-wave abnormality precordial leads, no change from prior EKG  Ejection fraction low 40 to 45% in September 2018 Taking Lasix daily  Prior  outpatient stress test  July 2013 showed significant breast attenuation artifact, inferior wall artifact from GI uptake. Overall no significant ischemia. History of chronic left arm and upper left chest  pain and was seen pain clinic. Prior diagnosis of carpal tunnel syndrome  occasional left breast pain radiating to her  mediastinum.    Previously seen by ear nose throat for vestibular testing as a cause of her dizziness.  EGD and colonoscopy which by her report showed hiatal hernia. Prior problems with swallowing  She has had MRI of the neck showing cervical disc disease   Echocardiogram in the hospital showed normal LV systolic function ejection fraction 50-55%, mild MR, mild to moderate TR with right ventricular systolic pressures 35-57  PMH:   has a past medical history of Allergy, Anemia, Arthritis, Asthma, Bacterial vaginitis, Blood in stool, Brachial neuritis or radiculitis NOS, Brain tumor (benign) ([place]) (07/2017), Bronchitis, Cardiac arrhythmia, Cervical neck pain with evidence of disc disease, Chronic combined systolic and diastolic CHF (congestive heart failure) ([place]), Chronic pain, Cocaine abuse, in remission ([place]), Colon polyps, COPD (chronic obstructive pulmonary disease) ([place]), Coronary artery disease, Depression, Diabetes mellitus without complication ([place]), Family history of colon cancer, Generalized headaches, GERD (gastroesophageal reflux disease), Headache, Heart murmur, Hematochezia, Hepatitis, History of cervical cancer, History of drug abuse ([place]), History of hepatitis B, History of MI (myocardial infarction), Hyperlipidemia, Hypertension, Ischemic cardiomyopathy, Myocardial infarction ([place]) (2003, 2012), PAD (peripheral artery disease) ([place]), 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  PSH:    Past Surgical History:  Procedure Laterality Date   ABDOMINAL HYSTERECTOMY     2003   CHOLECYSTECTOMY  1986   COLONOSCOPY  2008   3 polyps   COLONOSCOPY WITH PROPOFOL N/A 02/06/2015   Procedure: COLONOSCOPY WITH PROPOFOL;  Surgeon:  Lucilla Lame, MD;  Location: [facility] ENDOSCOPY;  Service: Endoscopy;  Laterality: N/A;   COLONOSCOPY WITH PROPOFOL N/A 01/27/2017   Procedure: COLONOSCOPY WITH PROPOFOL;  Surgeon: Lucilla Lame, MD;  Location: [facility] ENDOSCOPY;  Service: Endoscopy;  Laterality: N/A;   CORONARY ANGIOPLASTY     w/ stent placement x2   CORONARY ARTERY BYPASS GRAFT  2012   Dr [name]   CORONARY STENT PLACEMENT  2003   S/P MI   CORONARY STENT PLACEMENT  2007   [place]   ESOPHAGOGASTRODUODENOSCOPY (EGD) WITH PROPOFOL N/A 02/06/2015   Procedure: ESOPHAGOGASTRODUODENOSCOPY (EGD) WITH PROPOFOL;  Surgeon: Lucilla Lame, MD;  Location: [facility] ENDOSCOPY;  Service: Endoscopy;  Laterality: N/A;   ESOPHAGOGASTRODUODENOSCOPY (EGD) WITH PROPOFOL N/A 01/27/2017   Procedure: ESOPHAGOGASTRODUODENOSCOPY (EGD) WITH PROPOFOL;  Surgeon: Lucilla Lame, MD;  Location: [facility] ENDOSCOPY;  Service: Endoscopy;  Laterality: N/A;   ESOPHAGOGASTRODUODENOSCOPY (EGD) WITH PROPOFOL N/A 09/01/2019   Procedure: ESOPHAGOGASTRODUODENOSCOPY (EGD) WITH PROPOFOL;  Surgeon: Lin Landsman, MD;  Location: [place];  Service: Gastroenterology;  Laterality: N/A;   FEMORAL ARTERY STENT  10/2010   right sided (Dr. Burt Knack)   KNEE ARTHROSCOPY WITH MEDIAL MENISECTOMY Right 08/20/2017   Procedure: KNEE ARTHROSCOPY WITH MEDIAL  AND LATERAL MENISECTOMY;  Surgeon: Hessie Knows, MD;  Location: [facility] ORS;  Service: Orthopedics;  Laterality: Right;   POSTERIOR CERVICAL LAMINECTOMY N/A 03/08/2018   Procedure: POSTERIOR CERVICAL LAMINECTOMY-C7;  Surgeon: Deetta Perla, MD;  Location: [facility] ORS;  Service: Neurosurgery;  Laterality: N/A;   TONSILLECTOMY     TOTAL KNEE ARTHROPLASTY Right 04/14/2019   Procedure: RIGHT TOTAL KNEE ARTHROPLASTY;  Surgeon: Hessie Knows, MD;  Location: [facility] ORS;  Service: Orthopedics;  Laterality: Right;   TUBAL LIGATION      Current Outpatient Medications  Medication Sig Dispense Refill   albuterol (VENTOLIN HFA) 108 (90 Base) MCG/ACT inhaler Inhale 2 puffs into the lungs every 6 (six) hours as needed for wheezing or shortness of breath. 1 g 12   aspirin EC 81 MG tablet Take 1 tablet (81 mg total) by mouth daily. 90 tablet 3    Azelastine HCl 0.15 % SOLN Place 2 sprays into both nostrils daily as needed (allergies). 30 mL 11   benzonatate (TESSALON) 100 MG capsule Take 1 capsule (100 mg total) by mouth 3 (three) times daily as needed for cough. 60 capsule 0   Cyanocobalamin (VITAMIN B-12) 1000 MCG SUBL Place 0.001 tablets (1 mcg total) under the tongue daily. 90 tablet 3   cyclobenzaprine (FLEXERIL) 5 MG tablet Take 1-2 tablets (5-10 mg total) by mouth daily as needed for muscle spasms. 60 tablet 11   dapagliflozin propanediol (FARXIGA) 5 MG TABS tablet Take 1 tablet (5 mg total) by mouth daily before breakfast. DISCONTINUE METFORMIN 90 tablet 1   dicyclomine (BENTYL) 10 MG capsule Take 1 capsule (10 mg total) by mouth 3 (three) times daily before meals. As needed for abdominal pain 120 capsule 11   esomeprazole (NEXIUM) 40 MG capsule Take 1 capsule (40 mg total) by mouth daily. 90 capsule 3   Evolocumab (REPATHA SURECLICK) 353 MG/ML SOAJ INJECT THE CONTENTS OF 1 PEN INTO THE SKIN ONCE EVERY 14 DAYS PLEASE SCHEDULE OFFICE VISIT FOR FURTHER REFILLS. THANK YOU! 2 mL 0   ezetimibe (ZETIA) 10 MG tablet Take 1 tablet (10 mg total) by mouth daily. 90 tablet 3   fluticasone (FLONASE) 50 MCG/ACT nasal spray Place 2 sprays into both nostrils daily as needed for allergies. Max dose 2 sprays each nostril 48 g 3  Fluticasone-Umeclidin-Vilant (TRELEGY ELLIPTA) 100-62.5-25 MCG/INH AEPB INHALE 1 PUFF ONCE DAILY 60 each 11   furosemide (LASIX) 20 MG tablet TAKE 1 TABLET BY MOUTH ONCE DAILY AND A SECOND TABLET IF NEEDED FOR FLUID BUILD UP 180 tablet 0   hydrOXYzine (ATARAX/VISTARIL) 25 MG tablet TAKE 1 TO 2 TABLETS BY MOUTH ONCE DAILY AS NEEDED 60 tablet 11   ipratropium (ATROVENT) 0.06 % nasal spray Place 2 sprays into both nostrils 4 (four) times daily. 15 mL 12   ipratropium-albuterol (DUONEB) 0.5-2.5 (3) MG/3ML SOLN Take 3 mLs by nebulization 2 (two) times daily as needed (wheezing/shortness of breath). 360 mL 11   isosorbide  mononitrate (IMDUR) 30 MG 24 hr tablet Take 1 tablet by mouth twice daily 180 tablet 0   Lancets (ONETOUCH DELICA PLUS [id number]) MISC 1 Device by Does not apply route in the morning and at bedtime. 180 each 3   levocetirizine (XYZAL) 5 MG tablet Take 1 tablet (5 mg total) by mouth at bedtime as needed for allergies. 90 tablet 3   linaclotide (LINZESS) 72 MCG capsule TAKE 1 CAPSULE BY MOUTH ONCE DAILY BEFORE BREAKFAST 90 capsule 3   meclizine (ANTIVERT) 25 MG tablet TAKE 1 TABLET BY MOUTH TWICE DAILY AS NEEDED FOR DIZZINESS 60 tablet 11   metoprolol succinate (TOPROL-XL) 50 MG 24 hr tablet TAKE 1 TABLET BY MOUTH ONCE DAILY WITH OR IMMEDIATELY FOLLOWING A MEAL 90 tablet 3   montelukast (SINGULAIR) 10 MG tablet Take 1 tablet (10 mg total) by mouth at bedtime. 90 tablet 3   nitroGLYCERIN (NITROSTAT) 0.4 MG SL tablet DISSOLVE ONE TABLET UNDER THE TONGUE EVERY 5 MINUTES AS NEEDED FOR CHEST PAIN.  DO NOT EXCEED A TOTAL OF 3 DOSES IN 15 MINUTES 25 tablet 0   nystatin (MYCOSTATIN) 100000 UNIT/ML suspension Take 5 mLs (500,000 Units total) by mouth 4 (four) times daily. (Patient taking differently: Take 5 mLs by mouth as needed.) 240 mL 0   olopatadine (PATANOL) 0.1 % ophthalmic solution Place 1 drop into both eyes 2 (two) times daily. 15 mL 3   ondansetron (ZOFRAN) 4 MG tablet Take 1 tablet (4 mg total) by mouth every 8 (eight) hours as needed for nausea or vomiting. 20 tablet 5   ONETOUCH VERIO test strip USE AS DIRECTED 200 each 3   PARoxetine (PAXIL) 20 MG tablet Take 1 tablet by mouth once daily 90 tablet 1   potassium chloride (KLOR-CON) 10 MEQ tablet Take 1 tablet (10 mEq total) by mouth daily. 90 tablet 3   pregabalin (LYRICA) 75 MG capsule Take 1 capsule (75 mg total) by mouth 2 (two) times daily. 60 capsule 5   rivaroxaban (XARELTO) 2.5 MG TABS tablet Take 1 tablet by mouth twice daily 180 tablet 1   rosuvastatin (CRESTOR) 20 MG tablet Take 1 tablet (20 mg total) by mouth at bedtime. 90 tablet 3    sodium chloride (OCEAN) 0.65 % SOLN nasal spray Place 2 sprays into both nostrils daily as needed for congestion. 30 mL 11   traZODone (DESYREL) 50 MG tablet Take 0.5-1 tablets (25-50 mg total) by mouth at bedtime as needed for sleep. 90 tablet 3   Vibegron (GEMTESA) 75 MG TABS Take 75 mg by mouth daily. D/c myrbetriq 90 tablet 3   losartan (COZAAR) 25 MG tablet Take 0.5 tablets (12.5 mg total) by mouth daily. 45 tablet 3   No current facility-administered medications for this visit.    Allergies:   Latex, Shellfish allergy, Sulfonamide derivatives, Watermelon [citrullus vulgaris], Penicillins,  Soy allergy, Tomato, and Other   Social History:  The patient  reports that she quit smoking about 11 years ago. Her smoking use included cigarettes. She has a 38.00 pack-year smoking history. She has never used smokeless tobacco. She reports that she does not drink alcohol and does not use drugs.   Family History:   family history includes Acute myelogenous leukemia in her grandson; Arthritis in her sister; Brain cancer in her sister; Breast cancer (age of onset: 18) in her mother; Cancer in her father and mother; Colon cancer (age of onset: 67) in her father; Diabetes in her brother, father, and sister; Glaucoma in her father; Heart disease in her father; Heart failure in her father; Hypertension in her brother, father, and sister; Kidney disease in her sister; Other in her father and sister.    Review of Systems: Review of Systems  Constitutional: Negative.   HENT: Negative.    Respiratory: Negative.    Cardiovascular: Negative.   Musculoskeletal:  Positive for back pain and joint pain.  Neurological: Negative.   Psychiatric/Behavioral: Negative.    All other systems reviewed and are negative.  PHYSICAL EXAM: VS: 110/74 heart rate 76 oxygenation 98% weight 221 pounds  Constitutional:  oriented to person, place, and time. No distress.  HENT:  Head: Grossly normal Eyes:  no discharge. No  scleral icterus.  Neck: No JVD, no carotid bruits  Cardiovascular: Regular rate and rhythm, no murmurs appreciated Pulmonary/Chest: Clear to auscultation bilaterally, no wheezes or rails Abdominal: Soft.  no distension.  no tenderness.  Musculoskeletal: Normal range of motion Neurological:  normal muscle tone. Coordination normal. No atrophy Skin: Skin warm and dry Psychiatric: normal affect, pleasant  Recent Labs: 02/27/2021: Hemoglobin 14.1; Platelets 151.0 04/16/2021: TSH 1.520 06/04/2021: ALT 17 07/24/2021: BUN 15; Creatinine, Ser 0.98; Potassium 3.4; Sodium 138    Lipid Panel Lab Results  Component Value Date   CHOL 133 01/10/2021   HDL 51.40 01/10/2021   LDLCALC 56 01/10/2021   TRIG 130.0 01/10/2021      Wt Readings from Last 3 Encounters:  09/04/21 219 lb (99.3 kg)  08/26/21 218 lb (98.9 kg)  08/06/21 218 lb (98.9 kg)     ASSESSMENT AND PLAN:   CAD/ CABG: With stable angina Some typical and atypical features, Has been 5 years since prior testing Echocardiogram and stress test ordered, unable to treadmill  Ischemic cardiomyopathy Echocardiogram ordered as above for further evaluation of her ejection fraction  Hyperlipidemia Cholesterol is at goal on the current lipid regimen. No changes to the medications were made.  Essential hypertension Blood pressure is well controlled on today's visit. No changes made to the medications.  OSA on CPAP  On CPAP  COPD   stopped smoking several years ago Stable breathing  Chronic pain Chest, legs, back, arms Management per primary care  Leg swelling Stable   Total encounter time more than 30 minutes  Greater than 50% was spent in counseling and coordination of care with the patient   Orders Placed This Encounter  Procedures   NM Myocar Multi W/Spect W/Wall Motion / EF   Cardiac Stress Test: Informed Consent Details: Physician/Practitioner Attestation; Transcribe to consent form and obtain patient signature    EKG 12-Lead   ECHOCARDIOGRAM COMPLETE     Signed, Esmond Plants, M.D., Ph.D. 09/06/2021  [place], [place]

## 2021-09-06 NOTE — Telephone Encounter (Signed)
Patient informed and verbalized understanding

## 2021-09-09 ENCOUNTER — Telehealth: Payer: Self-pay | Admitting: Cardiovascular Disease

## 2021-09-09 ENCOUNTER — Telehealth: Payer: Self-pay

## 2021-09-09 ENCOUNTER — Other Ambulatory Visit: Payer: Self-pay

## 2021-09-09 DIAGNOSIS — Z86010 Personal history of colonic polyps: Secondary | ICD-10-CM

## 2021-09-09 MED ORDER — NA SULFATE-K SULFATE-MG SULF 17.5-3.13-1.6 GM/177ML PO SOLN: 1.0000 | Freq: Once | ORAL | 0 refills | Status: AC

## 2021-09-09 NOTE — Progress Notes (Signed)
Gastroenterology Pre-Procedure Review  Request Date: 10/14/2021 Requesting Physician: Dr. Marius Ditch  PATIENT REVIEW QUESTIONS: The patient responded to the following health history questions as indicated:    1. Are you having any GI issues? no 2. Do you have a personal history of Polyps? yes (last colonoscopy) 3. Do you have a family history of Colon Cancer or Polyps? yes (father colon cancer and polyps) 4. Diabetes Mellitus? Type 2  5. Joint replacements in the past 12 months?no 6. 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 Any artificial heart valves, MVP, or defibrillator?no but has two stints that are closed over     MEDICATIONS & ALLERGIES:    Patient reports the following regarding taking any anticoagulation/antiplatelet therapy:   Plavix, Coumadin, Eliquis, Xarelto, Lovenox, Pradaxa, Brilinta, or Effient? yes (xarelto) Aspirin? yes (81 mg)  Patient confirms/reports the following medications:  Current Outpatient Medications  Medication Sig Dispense Refill   albuterol (VENTOLIN HFA) 108 (90 Base) MCG/ACT inhaler Inhale 2 puffs into the lungs every 6 (six) hours as needed for wheezing or shortness of breath. 1 g 12   aspirin EC 81 MG tablet Take 1 tablet (81 mg total) by mouth daily. 90 tablet 3   Azelastine HCl 0.15 % SOLN Place 2 sprays into both nostrils daily as needed (allergies). 30 mL 11   benzonatate (TESSALON) 100 MG capsule Take 1 capsule (100 mg total) by mouth 3 (three) times daily as needed for cough. 60 capsule 0   Cyanocobalamin (VITAMIN B-12) 1000 MCG SUBL Place 0.001 tablets (1 mcg total) under the tongue daily. 90 tablet 3   cyclobenzaprine (FLEXERIL) 5 MG tablet Take 1-2 tablets (5-10 mg total) by mouth daily as needed for muscle spasms. 60 tablet 11   dapagliflozin propanediol (FARXIGA) 5 MG TABS tablet Take 1 tablet (5 mg total) by mouth daily before breakfast. DISCONTINUE METFORMIN 90 tablet 1   dicyclomine (BENTYL) 10 MG capsule Take 1  capsule (10 mg total) by mouth 3 (three) times daily before meals. As needed for abdominal pain 120 capsule 11   esomeprazole (NEXIUM) 40 MG capsule Take 1 capsule (40 mg total) by mouth daily. 90 capsule 3   Evolocumab (REPATHA SURECLICK) 269 MG/ML SOAJ INJECT THE CONTENTS OF 1 PEN INTO THE SKIN ONCE EVERY 14 DAYS PLEASE SCHEDULE OFFICE VISIT FOR FURTHER REFILLS. THANK YOU! 2 mL 0   ezetimibe (ZETIA) 10 MG tablet Take 1 tablet (10 mg total) by mouth daily. 90 tablet 3   fluticasone (FLONASE) 50 MCG/ACT nasal spray Place 2 sprays into both nostrils daily as needed for allergies. Max dose 2 sprays each nostril 48 g 3   Fluticasone-Umeclidin-Vilant (TRELEGY ELLIPTA) 100-62.5-25 MCG/INH AEPB INHALE 1 PUFF ONCE DAILY 60 each 11   furosemide (LASIX) 20 MG tablet TAKE 1 TABLET BY MOUTH ONCE DAILY AND A SECOND TABLET IF NEEDED FOR FLUID BUILD UP 180 tablet 0   hydrOXYzine (ATARAX/VISTARIL) 25 MG tablet TAKE 1 TO 2 TABLETS BY MOUTH ONCE DAILY AS NEEDED 60 tablet 11   ipratropium (ATROVENT) 0.06 % nasal spray Place 2 sprays into both nostrils 4 (four) times daily. 15 mL 12   ipratropium-albuterol (DUONEB) 0.5-2.5 (3) MG/3ML SOLN Take 3 mLs by nebulization 2 (two) times daily as needed (wheezing/shortness of breath). 360 mL 11   isosorbide mononitrate (IMDUR) 30 MG 24 hr tablet Take 1 tablet by mouth twice daily 180 tablet 0   Lancets (ONETOUCH DELICA PLUS [id number]) MISC 1 Device by Does not apply  route in the morning and at bedtime. 180 each 3   levocetirizine (XYZAL) 5 MG tablet Take 1 tablet (5 mg total) by mouth at bedtime as needed for allergies. 90 tablet 3   linaclotide (LINZESS) 72 MCG capsule TAKE 1 CAPSULE BY MOUTH ONCE DAILY BEFORE BREAKFAST 90 capsule 3   losartan (COZAAR) 25 MG tablet Take 0.5 tablets (12.5 mg total) by mouth daily. 45 tablet 3   meclizine (ANTIVERT) 25 MG tablet TAKE 1 TABLET BY MOUTH TWICE DAILY AS NEEDED FOR DIZZINESS 60 tablet 11   metoprolol succinate (TOPROL-XL) 50 MG 24 hr  tablet TAKE 1 TABLET BY MOUTH ONCE DAILY WITH OR IMMEDIATELY FOLLOWING A MEAL 90 tablet 3   montelukast (SINGULAIR) 10 MG tablet Take 1 tablet (10 mg total) by mouth at bedtime. 90 tablet 3   nitroGLYCERIN (NITROSTAT) 0.4 MG SL tablet DISSOLVE ONE TABLET UNDER THE TONGUE EVERY 5 MINUTES AS NEEDED FOR CHEST PAIN.  DO NOT EXCEED A TOTAL OF 3 DOSES IN 15 MINUTES 25 tablet 0   nystatin (MYCOSTATIN) 100000 UNIT/ML suspension Take 5 mLs (500,000 Units total) by mouth 4 (four) times daily. (Patient taking differently: Take 5 mLs by mouth as needed.) 240 mL 0   olopatadine (PATANOL) 0.1 % ophthalmic solution Place 1 drop into both eyes 2 (two) times daily. 15 mL 3   ondansetron (ZOFRAN) 4 MG tablet Take 1 tablet (4 mg total) by mouth every 8 (eight) hours as needed for nausea or vomiting. 20 tablet 5   ONETOUCH VERIO test strip USE AS DIRECTED 200 each 3   PARoxetine (PAXIL) 20 MG tablet Take 1 tablet by mouth once daily 90 tablet 1   potassium chloride (KLOR-CON) 10 MEQ tablet Take 1 tablet (10 mEq total) by mouth daily. 90 tablet 3   pregabalin (LYRICA) 75 MG capsule Take 1 capsule (75 mg total) by mouth 2 (two) times daily. 60 capsule 5   rivaroxaban (XARELTO) 2.5 MG TABS tablet Take 1 tablet by mouth twice daily 180 tablet 1   rosuvastatin (CRESTOR) 20 MG tablet Take 1 tablet (20 mg total) by mouth at bedtime. 90 tablet 3   sodium chloride (OCEAN) 0.65 % SOLN nasal spray Place 2 sprays into both nostrils daily as needed for congestion. 30 mL 11   traZODone (DESYREL) 50 MG tablet Take 0.5-1 tablets (25-50 mg total) by mouth at bedtime as needed for sleep. 90 tablet 3   Vibegron (GEMTESA) 75 MG TABS Take 75 mg by mouth daily. D/c myrbetriq 90 tablet 3   No current facility-administered medications for this visit.    Patient confirms/reports the following allergies:  Allergies  Allergen Reactions   Latex Rash        Shellfish Allergy Anaphylaxis    Hard shellfish/swelling in throat   Sulfonamide  Derivatives Anaphylaxis    Swelling in throat.   Watermelon [Citrullus Vulgaris] Other (See Comments)    Throat itchy   Penicillins Itching   Soy Allergy     Diarrhea/upset stomach     Tomato Itching    Throat itchy - also Raw carrots   Other Swelling    No orders of the defined types were placed in this encounter.   AUTHORIZATION INFORMATION Primary Insurance: 1D#: Group #:  Secondary Insurance: 1D#: Group #:  SCHEDULE INFORMATION: Date:10/14/2021  Time: Location:[facility]

## 2021-09-09 NOTE — Telephone Encounter (Signed)
° °  Pre-operative Risk Assessment    Patient Name: [name]  DOB: 1960-12-10 MRN: [id number]      Request for Surgical Clearance    Procedure:   Colonoscopy   Date of Surgery:  Clearance 10/14/21                                 Surgeon:  Not listed Surgeon's Group or Practice Name:  [facility]  Phone number:  [phone] Fax number:  [phone]   Type of Clearance Requested:   - Pharmacy:  Hold Rivaroxaban (Xarelto) instructions    Type of Anesthesia:  Not Indicated   Additional requests/questions:    Manfred Arch   09/09/2021, 4:37 PM

## 2021-09-09 NOTE — Telephone Encounter (Signed)
Patient is calling to make a recall colonoscopy with Dr. Marius Ditch. Please call patient to schedule

## 2021-09-10 ENCOUNTER — Telehealth: Payer: Self-pay | Admitting: Pharmacist

## 2021-09-10 ENCOUNTER — Ambulatory Visit (INDEPENDENT_AMBULATORY_CARE_PROVIDER_SITE_OTHER): Payer: Medicare HMO | Admitting: Pharmacist

## 2021-09-10 DIAGNOSIS — I739 Peripheral vascular disease, unspecified: Secondary | ICD-10-CM

## 2021-09-10 DIAGNOSIS — E782 Mixed hyperlipidemia: Secondary | ICD-10-CM

## 2021-09-10 DIAGNOSIS — I5022 Chronic systolic (congestive) heart failure: Secondary | ICD-10-CM

## 2021-09-10 DIAGNOSIS — I2510 Atherosclerotic heart disease of native coronary artery without angina pectoris: Secondary | ICD-10-CM

## 2021-09-10 DIAGNOSIS — E119 Type 2 diabetes mellitus without complications: Secondary | ICD-10-CM

## 2021-09-10 MED ORDER — DAPAGLIFLOZIN PROPANEDIOL 10 MG PO TABS: 10.0000 mg | ORAL_TABLET | Freq: Every day | ORAL | 2 refills | Status: DC

## 2021-09-10 NOTE — Telephone Encounter (Signed)
Dr. [name], please see Pharmacy's note below. Can you please comment on how long Eliquis can be held for upcoming procedure?  Please route response back to P CV DIV PREOP.  Thank you! Ophia Shamoon

## 2021-09-10 NOTE — Telephone Encounter (Signed)
Pharmacy, can you please comment on how long Xarelto can be held for upcoming procedure? Thank you!  Of note, patient was recently seen by Dr. [name] on 09/06/2021 at which time a stress test and Echo were ordered for further evaluation of chest pain with typical and atypical features. Myoview is scheduled for 09/18/2019 and Echo is scheduled for 09/30/2021. We will need to wait for the results of both of these tests prior to providing pre-op recommendations.  Darreld Mclean, PA-C 09/10/2021 9:13 AM

## 2021-09-10 NOTE — Chronic Care Management (AMB) (Signed)
Chronic Care Management CCM Pharmacy Note  09/10/2021 Name:  [name] MRN:  [id number] DOB:  05/04/1961  Summary: - Tolerating regimen.  - Reports concerns with urinary frequency/incontinence  Recommendations/Changes made from today's visit: - Increase Farxiga to evidence-based dose of 10 mg daily (HF and CKD). May allow for reduced loop diuretic, reduced impact of this on urinary symptoms - Will discuss urology referral with PCP  Subjective: [name] is an 61 y.o. year old female who is a primary patient of [name], [name], MD.  The CCM team was consulted for assistance with disease management and care coordination needs.    Engaged with patient by telephone for follow up for pharmacy case management and/or care coordination services.   Objective:  Medications Reviewed Today     Reviewed by De Hollingshead, RPH-CPP (Pharmacist) on 09/10/21 at 53  Med List Status: <None>   Medication Order Taking? Sig Documenting Provider Last Dose Status Informant  albuterol (VENTOLIN HFA) 108 (90 Base) MCG/ACT inhaler [id number] Yes Inhale 2 puffs into the lungs every 6 (six) hours as needed for wheezing or shortness of breath. [name], [name], MD Taking Active   aspirin EC 81 MG tablet [id number] Yes Take 1 tablet (81 mg total) by mouth daily. [name], [name], MD Taking Active   Azelastine HCl 0.15 % SOLN [id number] Yes Place 2 sprays into both nostrils daily as needed (allergies). [name], [name], MD Taking Active   benzonatate (TESSALON) 100 MG capsule [id number] Yes Take 1 capsule (100 mg total) by mouth 3 (three) times daily as needed for cough. [name], [name], MD Taking Active   Cyanocobalamin (VITAMIN B-12) 1000 MCG SUBL [id number] Yes Place 0.001 tablets (1 mcg total) under the tongue daily. [name], [name], MD Taking Active   cyclobenzaprine (FLEXERIL) 5 MG tablet [id number] Yes Take 1-2 tablets (5-10 mg total) by mouth  daily as needed for muscle spasms. [name], [name], MD Taking Active   dapagliflozin propanediol (FARXIGA) 5 MG TABS tablet [id number] Yes Take 1 tablet (5 mg total) by mouth daily before breakfast. DISCONTINUE METFORMIN Leone Haven, MD Taking Active   dicyclomine (BENTYL) 10 MG capsule [id number] Yes Take 1 capsule (10 mg total) by mouth 3 (three) times daily before meals. As needed for abdominal pain [name], [name], MD Taking Active   esomeprazole (NEXIUM) 40 MG capsule [id number] Yes Take 1 capsule (40 mg total) by mouth daily. [name], [name], MD Taking Active   Evolocumab [facility] SURECLICK) 299 MG/ML Darden Palmer [id number] Yes INJECT THE CONTENTS OF 1 PEN INTO THE SKIN ONCE EVERY 14 DAYS PLEASE SCHEDULE OFFICE VISIT FOR FURTHER REFILLS. THANK YOU! Minna Merritts, MD Taking Active   ezetimibe (ZETIA) 10 MG tablet [id number] Yes Take 1 tablet (10 mg total) by mouth daily. Minna Merritts, MD Taking Active   fluticasone Asencion Islam) 50 MCG/ACT nasal spray [id number] Yes Place 2 sprays into both nostrils daily as needed for allergies. Max dose 2 sprays each nostril Leone Haven, MD Taking Active   Fluticasone-Umeclidin-Vilant (TRELEGY ELLIPTA) 100-62.5-25 MCG/INH AEPB [id number] Yes INHALE 1 PUFF ONCE DAILY Chesley Mires, MD Taking Active   furosemide (LASIX) 20 MG tablet [id number] Yes TAKE 1 TABLET BY MOUTH ONCE DAILY AND A SECOND TABLET IF NEEDED FOR FLUID BUILD UP [name], Kathlene November, MD Taking Active   hydrOXYzine (ATARAX/VISTARIL) 25 MG tablet [id number] Yes TAKE 1 TO 2 TABLETS BY MOUTH ONCE DAILY AS NEEDED [name], [name], MD Taking Active   ipratropium (ATROVENT)  0.06 % nasal spray [id number] Yes Place 2 sprays into both nostrils 4 (four) times daily. [name], [name], MD Taking Active   ipratropium-albuterol (DUONEB) 0.5-2.5 (3) MG/3ML SOLN [id number] Yes Take 3 mLs by nebulization 2 (two) times daily as needed (wheezing/shortness of breath).  [name], [name], MD Taking Active   isosorbide mononitrate (IMDUR) 30 MG 24 hr tablet [id number] Yes Take 1 tablet by mouth twice daily Minna Merritts, MD Taking Active            Med Note ([place] Sep 10, 2021  2:10 PM) Once daily  Lancets (ONETOUCH DELICA PLUS [id number]) [place] [id number]  1 Device by Does not apply route in the morning and at bedtime. [name], [name], MD  Active   levocetirizine (XYZAL) 5 MG tablet [id number] Yes Take 1 tablet (5 mg total) by mouth at bedtime as needed for allergies. [name], [name], MD Taking Active   linaclotide Rolan Lipa) 72 MCG capsule [id number] Yes TAKE 1 CAPSULE BY MOUTH ONCE DAILY BEFORE BREAKFAST [name], [name], MD Taking Active            Med Note Nat Christen May 09, 2021  2:32 PM)    losartan (COZAAR) 25 MG tablet [id number] Yes Take 0.5 tablets (12.5 mg total) by mouth daily. Minna Merritts, MD Taking Active   meclizine (ANTIVERT) 25 MG tablet [id number] Yes TAKE 1 TABLET BY MOUTH TWICE DAILY AS NEEDED FOR DIZZINESS [name], [name], MD Taking Active   metoprolol succinate (TOPROL-XL) 50 MG 24 hr tablet [id number] Yes TAKE 1 TABLET BY MOUTH ONCE DAILY WITH OR IMMEDIATELY FOLLOWING A MEAL [name], Kathlene November, MD Taking Active   montelukast (SINGULAIR) 10 MG tablet [id number] Yes Take 1 tablet (10 mg total) by mouth at bedtime. [name], [name], MD Taking Active   nitroGLYCERIN (NITROSTAT) 0.4 MG SL tablet [id number]  DISSOLVE ONE TABLET UNDER THE TONGUE EVERY 5 MINUTES AS NEEDED FOR CHEST PAIN.  DO NOT EXCEED A TOTAL OF 3 DOSES IN 15 MINUTES [name], [name], MD  Active   nystatin (MYCOSTATIN) 100000 UNIT/ML suspension [id number]  Take 5 mLs (500,000 Units total) by mouth 4 (four) times daily.  Patient taking differently: Take 5 mLs by mouth as needed.   Leone Haven, MD  Active   olopatadine (PATANOL) 0.1 % ophthalmic solution [id number] Yes Place 1 drop into  both eyes 2 (two) times daily. Leone Haven, MD Taking Active   ondansetron [facility]) 4 MG tablet [id number]  Take 1 tablet (4 mg total) by mouth every 8 (eight) hours as needed for nausea or vomiting. [name], [name], MD  Active   [facility] VERIO test strip [id number] Yes USE AS DIRECTED [name], [name], MD Taking Active   PARoxetine (PAXIL) 20 MG tablet [id number] Yes Take 1 tablet by mouth once daily [name], [name], MD Taking Active   potassium chloride (KLOR-CON) 10 MEQ tablet [id number] Yes Take 1 tablet (10 mEq total) by mouth daily. Minna Merritts, MD Taking Active   pregabalin (LYRICA) 75 MG capsule [id number] Yes Take 1 capsule (75 mg total) by mouth 2 (two) times daily. [name], [name], MD Taking Active            Med Note ([place] Sep 10, 2021  2:12 PM) Taking once daily  rivaroxaban (XARELTO) 2.5 MG TABS tablet [id number] Yes Take 1 tablet by mouth twice daily [name], Kathlene November, MD Taking  Active   rosuvastatin (CRESTOR) 20 MG tablet [id number] Yes Take 1 tablet (20 mg total) by mouth at bedtime. Minna Merritts, MD Taking Active   sodium chloride (OCEAN) 0.65 % SOLN nasal spray [id number] Yes Place 2 sprays into both nostrils daily as needed for congestion. [name], [name], MD Taking Active   traZODone (DESYREL) 50 MG tablet [id number] Yes Take 0.5-1 tablets (25-50 mg total) by mouth at bedtime as needed for sleep. [name], [name], MD Taking Active   Vibegron [facility]) 75 MG TABS [id number] No Take 75 mg by mouth daily. D/c myrbetriq  Patient not taking: Reported on 09/10/2021   [name], [name], MD Not Taking Active             Pertinent Labs:   Lab Results  Component Value Date   HGBA1C 6.6 (H) 06/04/2021   Lab Results  Component Value Date   CHOL 133 01/10/2021   HDL 51.40 01/10/2021   LDLCALC 56 01/10/2021   TRIG 130.0 01/10/2021   CHOLHDL 3 01/10/2021   Lab Results  Component Value Date    CREATININE 0.98 07/24/2021   BUN 15 07/24/2021   NA 138 07/24/2021   K 3.4 (L) 07/24/2021   CL 105 07/24/2021   CO2 26 07/24/2021    SDOH:  (Social Determinants of Health) assessments and interventions performed:  SDOH Interventions    Flowsheet Row Most Recent Value  SDOH Interventions   Financial Strain Interventions Intervention Not Indicated       CCM Care Plan  Review of patient past medical history, allergies, medications, health status, including review of consultants reports, laboratory and other test data, was performed as part of comprehensive evaluation and provision of chronic care management services.   Care Plan : Medication Management  Updates made by De Hollingshead, RPH-CPP since 09/10/2021 12:00 AM     Problem: Diabetes, ASCVD, HF, COPD      Long-Range Goal: Disease Progression Prevention   Start Date: 03/28/2021  Recent Progress: On track  Priority: High  Note:   Current Barriers:  Does not adhere to prescribed medication regimen for metformin  Pharmacist Clinical Goal(s):  Over the next 90 days, patient will achieve adherence to monitoring guidelines and medication adherence to achieve therapeutic efficacy through collaboration with PharmD and provider.    Interventions: 1:1 collaboration with [name], [name], MD regarding development and update of comprehensive plan of care as evidenced by provider attestation and co-signature Inter-disciplinary care team collaboration (see longitudinal plan of care) Comprehensive medication review performed; medication list updated in electronic medical record  Health Maintenance   Yearly diabetic eye exam: up to date Yearly diabetic foot exam: due- placed reminder in upcoming visit notes Urine microalbumin: up to date Yearly influenza vaccination: up to date Td/Tdap vaccination: up to date Pneumonia vaccination: due - recommend PCV20 COVID vaccinations: due - recommend bivalent booster Shingrix  vaccinations: due - will discuss moving forward Colonoscopy: up to date  Diabetes: Controlled per A1c; current treatment: Farxiga 5 mg daily  Current glucose readings: fasting: all <110; post prandial: all <170 Previously recommended to continue current regimen   Hyperlipidemia, PAD, PVD, CAD, secondary ASCVD prevention: Controlled; current treatment: rosuvastatin 20 mg daily, ezetimibe 10 mg daily, Repatha 140 mg daily; follows w/ Dr. [name] Antiplatelet/coagulant regimen: Xarelto 2.5 mg BID, aspirin 81 mg daily Antianginal therapy: isosorbide mononitrate 30 mg BID- though reports she just takes QAM; no recent use of nitroglycerin Recommended to continue current regimen at this time. Advised to  notify Dr. [name] that she is just taking isosorbide QAM at her next appointment.  Heart Failure, mildly reduced: Appropriately managed; current treatment:  ARNI/ACEi/ARB: losartan 12.5 mg daily Beta blocker: metoprolol succinate 50 mg daily SGLT2: Farxiga 5 mg daily Mineralocorticoid Receptor Antagonist: none, no documented history Diuretic: furosemide 20 mg QAM, 20 mg QPM if needed; potassium 10 mEq daily Most recent ECHO: 03/2017 with EF 40-45% Significant polyuria resulting in urinary accidents. Discussed uro or uro/gyn referral as below, however, discussed increasing Farxiga to evidence-directed (for HF and CKD) dosing of 10 mg daily, diuretic benefit may allow for reduction in dose/frequency of furosemide. Patient amenable to try. Increase Farxiga to 10 mg daily. Contact us if development of worsening urinary symptoms. Discussed trialing lower doses of furosemide/reducing frequency. Monitor for SOB, lower extremity edema, weight gain. Patient verbalized understanding. If lowered furosemide need, consider adjustment of potassium moving forward.   Chronic Obstructive Pulmonary Disease, hx tobacco use, allergies: Controlled; current treatment: Trelegy 100/62.5/25 mcg daily, albuterol HFA PRN,  Duonebs PRN; levocetirizine 5 mg daily, montelukast 10 mg daily, fluticasone intranasal BID, azelastine nasal PRN, saline nasal spray PRN, olopatadine 0.1% eye drops BID Does report thrush has improved. Reports she rinses mouth after taking Trelegy Most recent Pulmonary Function Testing: PFT 05/16/16: FEV1 88%, FEV1% 79 Previously recommended to continue current regimen at this time along with collaboration with pulmonary.   Depression, Anxiety, Pain: Moderately well controlled per patient report; current regimen: paroxetine 20 mg daily, pregabalin 75 mg BID, hydroxyzine 25 mg BID PRN; cyclobenzaprine 5 mg TID PRN, trazodone 25-50 mg QPM PRN Previously recommended to continue current regimen at this time  GERD, IBS: Controlled per patient report; current regimen: esomeprazole 40 mg QAM, dicyclomine 10 mg TID PRN, ondansetron 4 mg PRN, Linzess 75 mcg daily Previously recommended to continue current regimen at this time.   Urinary frequency/incontinence: Uncontrolled; reports frequency episodes of urgency leading to incontinence. Did not receive Logan Bores, reports pharmacy told her insurance did not cover. Prior trial of Myrbetriq, but reports it lost efficacy over time Discussed increasing Farxiga to see if ability to reduce loop diuretic as above.  Discussed urology referral, or uro/GYN, as patient is also reporting vasomotor symptoms. Will discuss with PCP.     Patient Goals/Self-Care Activities Over the next 90 days, patient will:  - take medications as prescribed check glucose periodically, document, and provide at future appointments check blood pressure periodically, document, and provide at future appointments weigh daily, and contact provider if weight gain of >3 lbs in 1 day or >5 lbs per week      Plan: Telephone follow up appointment with care management team member scheduled for:  6 weeks  Catie Darnelle Maffucci, PharmD, [place], [place] Pharmacist [employer] at [employer] [phone]

## 2021-09-10 NOTE — Patient Instructions (Addendum)
Ms. Faires,   Let's try increasing Farxiga to 10 mg daily. This is the dose best studied in heart failure and with kidney protection. This may help with fluid enough that you won't need the Lasix as often or a full pill every day.   Monitor for any shortness of breath, swelling in your feet or ankles, or weight gain. Weigh daily. Contact Dr. Donivan Scull office if you gain 3 lbs overnight or 5 lbs in a week.   Take care!  Catie Darnelle Maffucci, PharmD  Visit Information  Following are the goals we discussed today:  Patient Goals/Self-Care Activities Over the next 90 days, patient will:  - take medications as prescribed check glucose periodically, document, and provide at future appointments check blood pressure periodically, document, and provide at future appointments weigh daily, and contact provider if weight gain of >3 lbs in 1 day or >5 lbs per week        Plan: Telephone follow up appointment with care management team member scheduled for:  6 weeks   Catie Darnelle Maffucci, PharmD, [place], CPP Clinical Pharmacist [place] at [facility] [phone]     Please call the care guide team at [phone] if you need to cancel or reschedule your appointment.   Patient verbalizes understanding of instructions and care plan provided today and agrees to view in [place]. Active MyChart status confirmed with patient.

## 2021-09-10 NOTE — Telephone Encounter (Signed)
Xarelto is 2.5 mg dose, used for PAD, clearance will need to come from Dr. [name]

## 2021-09-10 NOTE — Telephone Encounter (Signed)
Patient reports [name] was not covered by insurance. Given prior trial and failure of Myrbetriq, I suggested consideration for urology referral. She also reports vasomotor symptoms, unsure if uro/GYN would be most appropriate. Patient reports that she is interested in a referral to whatever speciality PCP finds most appropriate.   Routing to PCP

## 2021-09-12 ENCOUNTER — Telehealth: Payer: Self-pay | Admitting: Pulmonary Disease

## 2021-09-13 NOTE — Telephone Encounter (Signed)
CPAP machine order was sent to [facility]. I did not feel comfortable discussing the patient with the computer that called. I called patient to see if she was familiar with them but she did not answer. Left message for her to call back.

## 2021-09-13 NOTE — Addendum Note (Signed)
Addended by: Orland Mustard on: 09/13/2021 03:56 PM   Modules accepted: Orders

## 2021-09-13 NOTE — Telephone Encounter (Signed)
Referral sent to Dr. Glennon Mac pt is established  Dr. Wannetta Sender does not deal with hot flashes but overactive bladder Dr. Glennon Mac should deal with both

## 2021-09-15 DIAGNOSIS — I259 Chronic ischemic heart disease, unspecified: Secondary | ICD-10-CM | POA: Diagnosis not present

## 2021-09-15 DIAGNOSIS — G4710 Hypersomnia, unspecified: Secondary | ICD-10-CM | POA: Diagnosis not present

## 2021-09-15 DIAGNOSIS — G4733 Obstructive sleep apnea (adult) (pediatric): Secondary | ICD-10-CM | POA: Diagnosis not present

## 2021-09-16 ENCOUNTER — Other Ambulatory Visit: Payer: Self-pay | Admitting: Internal Medicine

## 2021-09-16 ENCOUNTER — Telehealth: Payer: Self-pay

## 2021-09-16 DIAGNOSIS — E119 Type 2 diabetes mellitus without complications: Secondary | ICD-10-CM

## 2021-09-16 NOTE — Telephone Encounter (Signed)
Resent blood thinner request

## 2021-09-16 NOTE — Telephone Encounter (Signed)
Patient is returning phone call. Patient phone number is [phone]. May leave detailed message on voicemail.

## 2021-09-16 NOTE — Telephone Encounter (Signed)
Spoke with [facility] who stated pt was a no show for her C Pap set appointment and has not called to set up another appointment. ATC pt and advise her to reach out to [employer] and reschedule a C Pap appointment. LVMTCB

## 2021-09-16 NOTE — Telephone Encounter (Signed)
Patient informed and verbalized understanding

## 2021-09-17 NOTE — Telephone Encounter (Signed)
° °  Patient Name: [name]  DOB: 01-06-1961 MRN: [id number]  Primary Cardiologist: Ida Rogue, MD  Chart reviewed as part of pre-operative protocol coverage. MD reply noted. Awaiting cardiac studies with nuc and echo 09/30/21. Final clearance will come following review of those results. Preop team will continue to follow. Will cc to requesting office so they are aware cardiac testing is pending.  Charlie Pitter, PA-C 09/17/2021, 9:56 AM

## 2021-09-18 DIAGNOSIS — M25511 Pain in right shoulder: Secondary | ICD-10-CM | POA: Diagnosis not present

## 2021-09-18 DIAGNOSIS — G8929 Other chronic pain: Secondary | ICD-10-CM | POA: Diagnosis not present

## 2021-09-18 DIAGNOSIS — M7581 Other shoulder lesions, right shoulder: Secondary | ICD-10-CM | POA: Diagnosis not present

## 2021-09-18 DIAGNOSIS — M7521 Bicipital tendinitis, right shoulder: Secondary | ICD-10-CM | POA: Diagnosis not present

## 2021-09-19 NOTE — Telephone Encounter (Signed)
Nothing noted in message. Will close encounter.  

## 2021-09-19 NOTE — Telephone Encounter (Signed)
Called and spoke with patient and she states she is using [facility] as a dme. Advised patient to call [facility] dme and speak with them about rescheduling.   Patient agreed and states she will give them a call  Nothing further needed.

## 2021-09-19 NOTE — Telephone Encounter (Signed)
ATC patient.  LMTCB. 

## 2021-09-19 NOTE — Telephone Encounter (Signed)
Received form from [facility] - phone number [phone], fax [phone] ATTN: medical records department.  They are requesting office notes from 03/14/21 and 08/26/21, and home sleep study from 04/25/21 be faxed to them.    Previous order for CPAP set up was sent to [facility], but doesn't appear she received CPAP from [facility].  Please confirm with [name] if she is to receive CPAP from [facility], and if so then please fax required forms to them.

## 2021-09-20 ENCOUNTER — Telehealth: Payer: Self-pay

## 2021-09-20 NOTE — Chronic Care Management (AMB) (Signed)
°  Care Management   Note  09/20/2021 Name: Trichelle Lehan MRN: [id number] DOB: 07-04-1961  Ruey Storer is a 61 y.o. year old female who is a primary care patient of [name], [name], MD and is actively engaged with the care management team. I reached out to [employer] by phone today to assist with re-scheduling a follow up visit with the Pharmacist  Follow up plan: Unsuccessful telephone outreach attempt made. A HIPAA compliant phone message was left for the patient providing contact information and requesting a return call.  The care management team will reach out to the patient again over the next 5 days.  If patient returns call to provider office, please advise to call [facility]  at [place], [address], [place], [place] [address] Direct Dial: [phone] [email or web address] Website: [email or web address]

## 2021-09-23 ENCOUNTER — Ambulatory Visit
Admission: RE | Admit: 2021-09-23 | Discharge: 2021-09-23 | Disposition: A | Discharge status: Home / Self Care | Payer: Medicare HMO | Source: Ambulatory Visit | Attending: Neurosurgery | Admitting: Neurosurgery

## 2021-09-23 ENCOUNTER — Other Ambulatory Visit: Payer: Self-pay

## 2021-09-23 DIAGNOSIS — M4312 Spondylolisthesis, cervical region: Secondary | ICD-10-CM | POA: Diagnosis not present

## 2021-09-23 DIAGNOSIS — M47812 Spondylosis without myelopathy or radiculopathy, cervical region: Secondary | ICD-10-CM | POA: Diagnosis not present

## 2021-09-23 DIAGNOSIS — M2578 Osteophyte, vertebrae: Secondary | ICD-10-CM | POA: Diagnosis not present

## 2021-09-23 DIAGNOSIS — M542 Cervicalgia: Secondary | ICD-10-CM | POA: Diagnosis not present

## 2021-09-23 DIAGNOSIS — M5412 Radiculopathy, cervical region: Secondary | ICD-10-CM | POA: Diagnosis not present

## 2021-09-24 DIAGNOSIS — J449 Chronic obstructive pulmonary disease, unspecified: Secondary | ICD-10-CM | POA: Diagnosis not present

## 2021-09-24 DIAGNOSIS — E119 Type 2 diabetes mellitus without complications: Secondary | ICD-10-CM

## 2021-09-24 DIAGNOSIS — I2510 Atherosclerotic heart disease of native coronary artery without angina pectoris: Secondary | ICD-10-CM

## 2021-09-24 DIAGNOSIS — E782 Mixed hyperlipidemia: Secondary | ICD-10-CM

## 2021-09-24 DIAGNOSIS — G4733 Obstructive sleep apnea (adult) (pediatric): Secondary | ICD-10-CM | POA: Diagnosis not present

## 2021-09-24 DIAGNOSIS — I259 Chronic ischemic heart disease, unspecified: Secondary | ICD-10-CM | POA: Diagnosis not present

## 2021-09-24 DIAGNOSIS — I5022 Chronic systolic (congestive) heart failure: Secondary | ICD-10-CM | POA: Diagnosis not present

## 2021-09-24 DIAGNOSIS — G4710 Hypersomnia, unspecified: Secondary | ICD-10-CM | POA: Diagnosis not present

## 2021-09-26 ENCOUNTER — Telehealth: Payer: Self-pay | Admitting: Cardiovascular Disease

## 2021-09-26 NOTE — Telephone Encounter (Signed)
Noted  

## 2021-09-26 NOTE — Telephone Encounter (Signed)
-----   Message from Wynema Birch, RN sent at 09/25/2021  4:20 PM EST ----- ?Regarding: Lexiscan scan ?It appears pt cancel her lexiscan schedule for 2/21 d/t "kidney issues" ?Can we call to try and reschedule this ?If not wanting to reschedule, can offer her appt to see Dr. [name] or APP ?Thanks ?Mandie ? ?

## 2021-09-26 NOTE — Telephone Encounter (Signed)
Spoke with patient  ?Declined to reschedule or schedule at this time  ?States she is having bad bladder issues and wants to clear this up before any appointments ?

## 2021-09-30 ENCOUNTER — Other Ambulatory Visit: Payer: Medicare HMO

## 2021-09-30 NOTE — Telephone Encounter (Signed)
Results from echo and nuclear stress test have not yet resulted.  Preoperative team will continue to screen/await  diagnostic results. ? ?Jossie Ng. Haiden Rawlinson NP-C ? ?  ?09/30/2021, 4:18 PM ?[facility] ?[address] ?Office [phone] Fax [phone] ? ?

## 2021-10-01 ENCOUNTER — Ambulatory Visit: Payer: Self-pay | Admitting: Pharmacist

## 2021-10-01 NOTE — Chronic Care Management (AMB) (Signed)
?  Chronic Care Management  ? ?Note ? ?10/01/2021 ?Name: [name] MRN: [id number] DOB: 06-May-1961 ? ? ? ?Closing pharmacy CCM case at this time. Patient has clinic contact information for future questions or concerns.  ? ?Catie Darnelle Maffucci, PharmD, [place], CPP ?Clinical Pharmacist ?Therapist, music at [employer] ?[phone] ? ?

## 2021-10-02 ENCOUNTER — Telehealth: Payer: Self-pay

## 2021-10-02 NOTE — Telephone Encounter (Signed)
Call placed to pt regarding surgical clearance.  Left a detailed message that we will send clearance back to the requesting surgeon's office to make them aware that she will require a stress test & echocardiogram before she can be cleared, and we are waiting on pt to complete, which has been cancelled. ? ?Left a message for pt that she can call back to reschedule the test at her convenience.  ?

## 2021-10-02 NOTE — Telephone Encounter (Signed)
Received letter from cardiology they wont clear patient till after stress test and echo patient has had two appointments and cancelled both called endo and canceled appointment  ?

## 2021-10-07 ENCOUNTER — Other Ambulatory Visit: Payer: Self-pay | Admitting: Cardiovascular Disease

## 2021-10-07 NOTE — Telephone Encounter (Signed)
Pt last saw Dr [name] 09/06/21, last labs 07/24/21 Creat 0.98, age 61, weight 99.3kg, CrCl 92.11, pt is on Xarelto for PAD/CAD/ CABG: With stable angina, Continue aspirin with Xarelto 2.5 mg BID per Dr Donivan Scull OV note. Will refill rx.  ?  ?

## 2021-10-07 NOTE — Telephone Encounter (Signed)
Refill request

## 2021-10-14 ENCOUNTER — Other Ambulatory Visit: Payer: Self-pay | Admitting: Cardiovascular Disease

## 2021-10-14 NOTE — Telephone Encounter (Signed)
?  ? ?  Patient Name: [name]  ?DOB: [birth year] ?MRN: [id number] ? ?Primary Cardiologist: Ida Rogue, MD ? ?Chart reviewed as part of pre-operative protocol coverage.  ? ?It appears we received preoperative risk evaluation request for colonoscopy. Pt needed a nuclear stress test and echo to proceed to surgery. Pt canceled both appts and has not rescheduled tests. Appears GI has canceled her 10/16/21 appt. I will remove from the preop pool. When this is rescheduled, please reach back out to our office for risk stratification.  ? ?I will fax to the requesting office.  ? ? ?Ledora Bottcher, PA ?10/14/2021, 8:39 AM ? ?

## 2021-10-16 ENCOUNTER — Ambulatory Visit: Admit: 2021-10-16 | Payer: Medicare HMO | Admitting: Gastroenterology

## 2021-10-16 SURGERY — COLONOSCOPY WITH PROPOFOL
Anesthesia: General

## 2021-10-21 ENCOUNTER — Telehealth: Payer: Medicare HMO

## 2021-10-22 DIAGNOSIS — G4733 Obstructive sleep apnea (adult) (pediatric): Secondary | ICD-10-CM | POA: Diagnosis not present

## 2021-10-22 DIAGNOSIS — G4710 Hypersomnia, unspecified: Secondary | ICD-10-CM | POA: Diagnosis not present

## 2021-10-22 DIAGNOSIS — J449 Chronic obstructive pulmonary disease, unspecified: Secondary | ICD-10-CM | POA: Diagnosis not present

## 2021-10-22 DIAGNOSIS — I259 Chronic ischemic heart disease, unspecified: Secondary | ICD-10-CM | POA: Diagnosis not present

## 2021-10-26 DIAGNOSIS — G4710 Hypersomnia, unspecified: Secondary | ICD-10-CM | POA: Diagnosis not present

## 2021-10-26 DIAGNOSIS — G4733 Obstructive sleep apnea (adult) (pediatric): Secondary | ICD-10-CM | POA: Diagnosis not present

## 2021-10-26 DIAGNOSIS — I259 Chronic ischemic heart disease, unspecified: Secondary | ICD-10-CM | POA: Diagnosis not present

## 2021-10-29 NOTE — Chronic Care Management (AMB) (Signed)
?  Chronic Care Management  ? ?Note ? ?10/29/2021 ?Name: [name] MRN: [id number] DOB: 1960-11-06 ? ?[name] is a 61 y.o. year old female who is a primary care patient of [name], [name], MD. [name] is currently enrolled in care management services. An additional referral for RNCM was placed.  ? ?Follow up plan: ?Telephone appointment with care management team member scheduled for:11/22/2021 ? ?Noreene Larsson, RMA ?Care Guide, Embedded Care Coordination ?[facility]  ?[place], [place] [address] ?Direct Dial: [phone] ?[email or web address] ?Website: [email or web address]  ? ?

## 2021-10-30 ENCOUNTER — Other Ambulatory Visit (HOSPITAL_COMMUNITY): Payer: Self-pay | Admitting: Cardiovascular Disease

## 2021-10-30 DIAGNOSIS — I739 Peripheral vascular disease, unspecified: Secondary | ICD-10-CM

## 2021-11-01 DIAGNOSIS — M25511 Pain in right shoulder: Secondary | ICD-10-CM | POA: Diagnosis not present

## 2021-11-01 DIAGNOSIS — M542 Cervicalgia: Secondary | ICD-10-CM | POA: Diagnosis not present

## 2021-11-01 DIAGNOSIS — M6281 Muscle weakness (generalized): Secondary | ICD-10-CM | POA: Diagnosis not present

## 2021-11-04 ENCOUNTER — Ambulatory Visit (HOSPITAL_COMMUNITY)
Admission: RE | Admit: 2021-11-04 | Discharge: 2021-11-04 | Disposition: A | Discharge status: Home / Self Care | Payer: Medicare HMO | Source: Ambulatory Visit | Attending: Cardiology | Admitting: Cardiology

## 2021-11-04 DIAGNOSIS — I739 Peripheral vascular disease, unspecified: Secondary | ICD-10-CM

## 2021-11-05 ENCOUNTER — Telehealth: Payer: Self-pay | Admitting: Emergency Medicine

## 2021-11-05 NOTE — Telephone Encounter (Signed)
Called and spoke with patient. Results reviewed with patient, pt verbalized understanding,  questions (if any) answered.   ?

## 2021-11-05 NOTE — Telephone Encounter (Signed)
Also results from LE doppler:  ? ?"Lower extremity arterial Doppler, stent patent, no significant stenosis" ? ? ?

## 2021-11-05 NOTE — Telephone Encounter (Signed)
-----   Message from Minna Merritts, MD sent at 11/04/2021  5:55 PM EDT ----- ?This is ankle-brachial index ?Normal range both legs ?

## 2021-11-08 ENCOUNTER — Ambulatory Visit
Admission: RE | Admit: 2021-11-08 | Discharge: 2021-11-08 | Disposition: A | Discharge status: Home / Self Care | Payer: Medicare HMO | Source: Ambulatory Visit | Attending: Internal Medicine | Admitting: Internal Medicine

## 2021-11-08 DIAGNOSIS — N6310 Unspecified lump in the right breast, unspecified quadrant: Secondary | ICD-10-CM

## 2021-11-08 DIAGNOSIS — N6313 Unspecified lump in the right breast, lower outer quadrant: Secondary | ICD-10-CM | POA: Insufficient documentation

## 2021-11-08 DIAGNOSIS — R928 Other abnormal and inconclusive findings on diagnostic imaging of breast: Secondary | ICD-10-CM | POA: Diagnosis not present

## 2021-11-11 DIAGNOSIS — M25511 Pain in right shoulder: Secondary | ICD-10-CM | POA: Diagnosis not present

## 2021-11-11 DIAGNOSIS — M542 Cervicalgia: Secondary | ICD-10-CM | POA: Diagnosis not present

## 2021-11-11 DIAGNOSIS — M6281 Muscle weakness (generalized): Secondary | ICD-10-CM | POA: Diagnosis not present

## 2021-11-13 DIAGNOSIS — M6281 Muscle weakness (generalized): Secondary | ICD-10-CM | POA: Diagnosis not present

## 2021-11-13 DIAGNOSIS — G4710 Hypersomnia, unspecified: Secondary | ICD-10-CM | POA: Diagnosis not present

## 2021-11-13 DIAGNOSIS — I259 Chronic ischemic heart disease, unspecified: Secondary | ICD-10-CM | POA: Diagnosis not present

## 2021-11-13 DIAGNOSIS — G4733 Obstructive sleep apnea (adult) (pediatric): Secondary | ICD-10-CM | POA: Diagnosis not present

## 2021-11-13 DIAGNOSIS — M25511 Pain in right shoulder: Secondary | ICD-10-CM | POA: Diagnosis not present

## 2021-11-13 DIAGNOSIS — M542 Cervicalgia: Secondary | ICD-10-CM | POA: Diagnosis not present

## 2021-11-20 DIAGNOSIS — M542 Cervicalgia: Secondary | ICD-10-CM | POA: Diagnosis not present

## 2021-11-20 DIAGNOSIS — M25511 Pain in right shoulder: Secondary | ICD-10-CM | POA: Diagnosis not present

## 2021-11-20 DIAGNOSIS — M6281 Muscle weakness (generalized): Secondary | ICD-10-CM | POA: Diagnosis not present

## 2021-11-22 ENCOUNTER — Ambulatory Visit: Payer: Medicare HMO | Admitting: *Deleted

## 2021-11-22 DIAGNOSIS — I259 Chronic ischemic heart disease, unspecified: Secondary | ICD-10-CM | POA: Diagnosis not present

## 2021-11-22 DIAGNOSIS — J449 Chronic obstructive pulmonary disease, unspecified: Secondary | ICD-10-CM | POA: Diagnosis not present

## 2021-11-22 DIAGNOSIS — G4733 Obstructive sleep apnea (adult) (pediatric): Secondary | ICD-10-CM | POA: Diagnosis not present

## 2021-11-22 DIAGNOSIS — G4710 Hypersomnia, unspecified: Secondary | ICD-10-CM | POA: Diagnosis not present

## 2021-11-22 NOTE — Chronic Care Management (AMB) (Signed)
? Care Management ?  ? RN Visit Note ? ?11/22/2021 ?Name: [name] MRN: [id number] DOB: 09/07/1960 ? ?Subjective: ?[name] is a 61 y.o. year old female who is a primary care patient of [name], [name], MD. The care management team was consulted for assistance with disease management and care coordination needs.   ? ?Engaged with patient by telephone for initial visit in response to provider referral for case management and/or care coordination services.  ? ?Consent to Services:  ? [name] was given information about Care Management services today including:  ?Care Management services includes personalized support from designated clinical staff supervised by her physician, including individualized plan of care and coordination with other care providers ?24/7 contact phone numbers for assistance for urgent and routine care needs. ?The patient may stop case management services at any time by phone call to the office staff. ? ?Patient agreed to services and consent obtained.  ? ?Assessment: Review of patient past medical history, allergies, medications, health status, including review of consultants reports, laboratory and other test data, was performed as part of comprehensive evaluation and provision of chronic care management services.  ? ?SDOH (Social Determinants of Health) assessments and interventions performed:  ?SDOH Interventions   ? ?Flowsheet Row Most Recent Value  ?SDOH Interventions   ?Food Insecurity Interventions Other (Comment)  [recieves food stamps]  ?Financial Strain Interventions Intervention Not Indicated  ?Housing Interventions Intervention Not Indicated  ?Intimate Partner Violence Interventions Intervention Not Indicated  ?Transportation Interventions Intervention Not Indicated, Patient Resources (Friends/Family)  ? ?  ?  ? ?Care Plan ? ?Allergies  ?Allergen Reactions  ? Latex Rash  ?   ?  ? Shellfish Allergy Anaphylaxis  ?  Hard shellfish/swelling in throat  ?  Sulfonamide Derivatives Anaphylaxis  ?  Swelling in throat.  ? Watermelon [Citrullus Vulgaris] Other (See Comments)  ?  Throat itchy  ? Penicillins Itching  ? Soy Allergy   ?  Diarrhea/upset stomach  ?  ? Tomato Itching  ?  Throat itchy - also Raw carrots  ? Other Swelling  ? ? ?Outpatient Encounter Medications as of 11/22/2021  ?Medication Sig Note  ? albuterol (VENTOLIN HFA) 108 (90 Base) MCG/ACT inhaler Inhale 2 puffs into the lungs every 6 (six) hours as needed for wheezing or shortness of breath.   ? aspirin EC 81 MG tablet Take 1 tablet (81 mg total) by mouth daily.   ? Azelastine HCl 0.15 % SOLN Place 2 sprays into both nostrils daily as needed (allergies).   ? benzonatate (TESSALON) 100 MG capsule Take 1 capsule (100 mg total) by mouth 3 (three) times daily as needed for cough.   ? Cyanocobalamin (VITAMIN B-12) 1000 MCG SUBL Place 0.001 tablets (1 mcg total) under the tongue daily.   ? cyclobenzaprine (FLEXERIL) 5 MG tablet Take 1-2 tablets (5-10 mg total) by mouth daily as needed for muscle spasms.   ? dapagliflozin propanediol (FARXIGA) 10 MG TABS tablet Take 1 tablet (10 mg total) by mouth daily before breakfast.   ? dicyclomine (BENTYL) 10 MG capsule Take 1 capsule (10 mg total) by mouth 3 (three) times daily before meals. As needed for abdominal pain   ? esomeprazole (NEXIUM) 40 MG capsule Take 1 capsule (40 mg total) by mouth daily.   ? Evolocumab (REPATHA SURECLICK) 622 MG/ML SOAJ INJECT THE CONTENTS OF 1 PEN INTO THE SKIN ONCE EVERY 14 DAYS   ? ezetimibe (ZETIA) 10 MG tablet Take 1 tablet (10 mg total) by mouth daily.   ?  fluticasone (FLONASE) 50 MCG/ACT nasal spray Place 2 sprays into both nostrils daily as needed for allergies. Max dose 2 sprays each nostril   ? Fluticasone-Umeclidin-Vilant (TRELEGY ELLIPTA) 100-62.5-25 MCG/INH AEPB INHALE 1 PUFF ONCE DAILY   ? furosemide (LASIX) 20 MG tablet TAKE 1 TABLET BY MOUTH ONCE DAILY AND A SECOND TABLET IF NEEDED FOR FLUID BUILD UP.   ? hydrOXYzine  (ATARAX/VISTARIL) 25 MG tablet TAKE 1 TO 2 TABLETS BY MOUTH ONCE DAILY AS NEEDED   ? ipratropium (ATROVENT) 0.06 % nasal spray Place 2 sprays into both nostrils 4 (four) times daily. 11/22/2021: REPORTS USING EVERY OTHER DAY  ? ipratropium-albuterol (DUONEB) 0.5-2.5 (3) MG/3ML SOLN Take 3 mLs by nebulization 2 (two) times daily as needed (wheezing/shortness of breath).   ? isosorbide mononitrate (IMDUR) 30 MG 24 hr tablet Take 1 tablet by mouth twice daily 11/22/2021: REPORTS TAKING ONCE A DAY  ? levocetirizine (XYZAL) 5 MG tablet Take 1 tablet (5 mg total) by mouth at bedtime as needed for allergies.   ? linaclotide (LINZESS) 72 MCG capsule TAKE 1 CAPSULE BY MOUTH ONCE DAILY BEFORE BREAKFAST   ? losartan (COZAAR) 25 MG tablet Take 0.5 tablets (12.5 mg total) by mouth daily.   ? meclizine (ANTIVERT) 25 MG tablet TAKE 1 TABLET BY MOUTH TWICE DAILY AS NEEDED FOR DIZZINESS   ? metoprolol succinate (TOPROL-XL) 50 MG 24 hr tablet TAKE 1 TABLET BY MOUTH ONCE DAILY WITH OR IMMEDIATELY FOLLOWING A MEAL   ? montelukast (SINGULAIR) 10 MG tablet Take 1 tablet (10 mg total) by mouth at bedtime.   ? nitroGLYCERIN (NITROSTAT) 0.4 MG SL tablet DISSOLVE ONE TABLET UNDER THE TONGUE EVERY 5 MINUTES AS NEEDED FOR CHEST PAIN.  DO NOT EXCEED A TOTAL OF 3 DOSES IN 15 MINUTES   ? nystatin (MYCOSTATIN) 100000 UNIT/ML suspension Take 5 mLs (500,000 Units total) by mouth 4 (four) times daily. (Patient taking differently: Take 5 mLs by mouth as needed.)   ? olopatadine (PATANOL) 0.1 % ophthalmic solution Place 1 drop into both eyes 2 (two) times daily.   ? ondansetron (ZOFRAN) 4 MG tablet Take 1 tablet (4 mg total) by mouth every 8 (eight) hours as needed for nausea or vomiting.   ? PARoxetine (PAXIL) 20 MG tablet Take 1 tablet by mouth once daily   ? potassium chloride (KLOR-CON) 10 MEQ tablet Take 1 tablet (10 mEq total) by mouth daily.   ? pregabalin (LYRICA) 75 MG capsule Take 1 capsule (75 mg total) by mouth 2 (two) times daily. 09/10/2021:  Taking once daily  ? rivaroxaban (XARELTO) 2.5 MG TABS tablet Take 1 tablet by mouth twice daily   ? rosuvastatin (CRESTOR) 20 MG tablet Take 1 tablet (20 mg total) by mouth at bedtime.   ? sodium chloride (OCEAN) 0.65 % SOLN nasal spray Place 2 sprays into both nostrils daily as needed for congestion.   ? traZODone (DESYREL) 50 MG tablet Take 0.5-1 tablets (25-50 mg total) by mouth at bedtime as needed for sleep.   ? OneTouch Delica Lancets 38B MISC USE TO CHECK GLUCOSE IN THE MORNING AND AT BEDTIME   ? ONETOUCH VERIO test strip USE 1 STRIP TO CHECK GLUCOSE TWICE DAILY ONCE  IN  THE  MORNING  AND  ONCE  AT  BEDTIME   ? Vibegron (GEMTESA) 75 MG TABS Take 75 mg by mouth daily. D/c myrbetriq (Patient not taking: Reported on 09/10/2021)   ? ?No facility-administered encounter medications on file as of 11/22/2021.  ? ? ?Patient  Active Problem List  ? Diagnosis Date Noted  ? PND (post-nasal drip) 07/24/2021  ? Chronic pain of inguinal region 07/24/2021  ? Thrush 04/19/2021  ? Skin lesion 04/19/2021  ? Vaginal itching 04/19/2021  ? Vitamin D deficiency 01/14/2021  ? Breast mass, right 01/14/2021  ? Annual physical exam 01/14/2021  ? Pruritus of both eyes 09/21/2020  ? Lipoma of right lower extremity 08/30/2020  ? Hypertension associated with diabetes ([place]) 07/24/2020  ? Abnormality of both breasts on screening mammogram 05/14/2020  ? Bilateral hip pain 04/19/2020  ? Insomnia 04/19/2020  ? Chronic low back pain 02/22/2020  ? Internal hemorrhoids 02/22/2020  ? Benign breast cyst in female, right 02/22/2020  ? OSA on CPAP 12/15/2019  ? 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 Sensorimotor neuropathy 12/02/2019  ? Irritable bowel syndrome with both constipation and diarrhea 10/13/2019  ? Mild intermittent asthma 10/13/2019  ? PUD (peptic ulcer disease)   ? Status post total knee replacement using cement, right 04/14/2019  ? Overactive bladder 03/10/2019  ? Mild persistent asthma 11/29/2018  ? DDD (degenerative disc disease), cervical  11/26/2018  ? Chronic constipation 11/26/2018  ? Fatty liver 11/26/2018  ? Adrenal adenoma, left 11/26/2018  ? Allergic rhinitis 08/26/2018  ? Bunion of right foot 08/26/2018  ? Fall 05/14/2018  ? Anxiety and depress

## 2021-11-22 NOTE — Patient Instructions (Addendum)
Visit Information ? ?Thank you for taking time to visit with me today. Please don't hesitate to contact me if I can be of assistance to you before our next scheduled telephone appointment. ? ?Following are the goals we discussed today:  ?Take all medications as prescribed ?Attend all scheduled provider appointments ?Call provider office for new concerns or questions  ?call office if I gain more than 2 pounds in one day or 5 pounds in one week ?track weight in diary ?use salt in moderation ?weigh myself daily ?develop a rescue plan ?keep appointment with eye doctor ?check blood sugar at prescribed times: when you have symptoms of low or high blood sugar and few times a week ? ?Our next appointment is by telephone on 6/5 at 1030 ? ?Please call the care guide team at [phone] if you need to cancel or reschedule your appointment.  ? ?If you are experiencing a Mental Health or [place] or need someone to talk to, please call the Suicide and Crisis Lifeline: 988 ?call the [place] National Suicide Prevention Lifeline: (312) 527-8238 or TTY: 440-661-0232 TTY 8482442817) to talk to a trained counselor ?call 1-800-273-TALK (toll free, 24 hour hotline) ?call 911  ? ?Following is a copy of your full plan of care:  ?Care Plan : [place] of Care (Adult)  ?Updates made by Lorraine Singleton, RN since 11/22/2021 12:00 AM  ?  ? ?Problem: KNOWLEDGE DEFICIET RELATED TO SELF CARE MANAGEMENT OF CHRONIC MEICAL CONDITIONS   ?Priority: Medium  ?  ? ?Long-Range Goal: PATIENT WILL WORK WITH CCM TEAM TO GAIN KNOWLEDGE TO BETTER MANAGE CHRONIC CONDITIONS   ?Start Date: 11/22/2021  ?Expected End Date: 11/22/2022  ?Priority: Medium  ?Note:   ?Current Barriers:  ?Knowledge Deficits related to plan of care for management of CHF and DMII  ?4/28--acknowledges history of diabetes, heat failure, hypertension, and COPD.  Has not been weighing, checking blood sugars, nor blood pressures.  States she will start.  Denies any  chest pain, SOB, dizziness, or swelling.  No major questions or concerns ? ?RNCM Clinical Goal(s):  ?Patient will demonstrate Improved health management independence as evidenced by WEIGHING DAILY, CHECKING BLOOD SUGARS EVERY OTHER DAY  through collaboration with RN Care manager, provider, and care team.  ? ?Interventions: ?1:1 collaboration with primary care provider regarding development and update of comprehensive plan of care as evidenced by provider attestation and co-signature ?Inter-disciplinary care team collaboration (see longitudinal plan of care) ?Evaluation of current treatment plan related to  self management and patient's adherence to plan as established by provider ? ? ?Heart Failure Interventions:  (Status:  New goal.) Long Term Goal ?Basic overview and discussion of pathophysiology of Heart Failure reviewed ?Provided education on low sodium diet ?Assessed need for readable accurate scales in home ?Provided education about placing scale on hard, flat surface ?Advised patient to weigh each morning after emptying bladder ?Discussed importance of daily weight and advised patient to weigh and record daily ?Reviewed role of diuretics in prevention of fluid overload and management of heart failure; ?Discussed the importance of keeping all appointments with provider ? ?Diabetes Interventions:  (Status:  New goal.) Long Term Goal ?Assessed patient's understanding of A1c goal: <7% ?Provided education to patient about basic DM disease process ?Reviewed medications with patient and discussed importance of medication adherence ?Counseled on importance of regular laboratory monitoring as prescribed ?Discussed plans with patient for ongoing care management follow up and provided patient with direct contact information for care management team ?Provided  patient with written educational materials related to hypo and hyperglycemia and importance of correct treatment ?Advised patient, providing education and  rationale, to check cbg few times week/every other day and record, calling PCP for findings outside established parameters ?Screening for signs and symptoms of depression related to chronic disease state  ?Assessed social determinant of health barriers ?Lab Results  ?Component Value Date  ? HGBA1C 6.6 (H) 06/04/2021  ? ?Patient Goals/Self-Care Activities: ?Take all medications as prescribed ?Attend all scheduled provider appointments ?Call provider office for new concerns or questions  ?call office if I gain more than 2 pounds in one day or 5 pounds in one week ?track weight in diary ?use salt in moderation ?weigh myself daily ?develop a rescue plan ?keep appointment with eye doctor ?check blood sugar at prescribed times: when you have symptoms of low or high blood sugar and few times a week ? ?Follow Up Plan:  The care management team will reach out to the patient again over the next 45 days. ? ?  ? ? ?[name] was given information about Care Management services by the embedded care coordination team including:  ?Care Management services include personalized support from designated clinical staff supervised by her physician, including individualized plan of care and coordination with other care providers ?24/7 contact phone numbers for assistance for urgent and routine care needs. ?The patient may stop CCM services at any time (effective at the end of the month) by phone call to the office staff. ? ?Patient agreed to services and verbal consent obtained.  ? ?Patient verbalizes understanding of instructions and care plan provided today and agrees to view in [facility]. Active MyChart status confirmed with patient.   ? ?The care management team will reach out to the patient again over the next 45 days.  ? ?Lorraine Azure RN, MSN ?RN Care Management Coordinator ?[facility] ?[phone] ?[name] ? ? ?  ?

## 2021-11-25 DIAGNOSIS — M25511 Pain in right shoulder: Secondary | ICD-10-CM | POA: Diagnosis not present

## 2021-11-25 DIAGNOSIS — M542 Cervicalgia: Secondary | ICD-10-CM | POA: Diagnosis not present

## 2021-11-25 DIAGNOSIS — M6281 Muscle weakness (generalized): Secondary | ICD-10-CM | POA: Diagnosis not present

## 2021-12-02 DIAGNOSIS — M542 Cervicalgia: Secondary | ICD-10-CM | POA: Diagnosis not present

## 2021-12-02 DIAGNOSIS — M6281 Muscle weakness (generalized): Secondary | ICD-10-CM | POA: Diagnosis not present

## 2021-12-02 DIAGNOSIS — M25511 Pain in right shoulder: Secondary | ICD-10-CM | POA: Diagnosis not present

## 2021-12-04 DIAGNOSIS — M6281 Muscle weakness (generalized): Secondary | ICD-10-CM | POA: Diagnosis not present

## 2021-12-04 DIAGNOSIS — M25511 Pain in right shoulder: Secondary | ICD-10-CM | POA: Diagnosis not present

## 2021-12-04 DIAGNOSIS — M542 Cervicalgia: Secondary | ICD-10-CM | POA: Diagnosis not present

## 2021-12-06 ENCOUNTER — Ambulatory Visit (INDEPENDENT_AMBULATORY_CARE_PROVIDER_SITE_OTHER): Payer: Medicare HMO | Admitting: Internal Medicine

## 2021-12-06 ENCOUNTER — Encounter: Payer: Self-pay | Admitting: Internal Medicine

## 2021-12-06 VITALS — BP 116/70 | HR 65 | Temp 98.5°F | Resp 14 | Ht 65.0 in | Wt 215.4 lb

## 2021-12-06 DIAGNOSIS — J449 Chronic obstructive pulmonary disease, unspecified: Secondary | ICD-10-CM

## 2021-12-06 DIAGNOSIS — E785 Hyperlipidemia, unspecified: Secondary | ICD-10-CM | POA: Diagnosis not present

## 2021-12-06 DIAGNOSIS — E1159 Type 2 diabetes mellitus with other circulatory complications: Secondary | ICD-10-CM | POA: Diagnosis not present

## 2021-12-06 DIAGNOSIS — F419 Anxiety disorder, unspecified: Secondary | ICD-10-CM

## 2021-12-06 DIAGNOSIS — J4520 Mild intermittent asthma, uncomplicated: Secondary | ICD-10-CM

## 2021-12-06 DIAGNOSIS — I152 Hypertension secondary to endocrine disorders: Secondary | ICD-10-CM | POA: Diagnosis not present

## 2021-12-06 DIAGNOSIS — I10 Essential (primary) hypertension: Secondary | ICD-10-CM

## 2021-12-06 DIAGNOSIS — R69 Illness, unspecified: Secondary | ICD-10-CM | POA: Diagnosis not present

## 2021-12-06 DIAGNOSIS — R609 Edema, unspecified: Secondary | ICD-10-CM

## 2021-12-06 DIAGNOSIS — N3281 Overactive bladder: Secondary | ICD-10-CM

## 2021-12-06 DIAGNOSIS — N819 Female genital prolapse, unspecified: Secondary | ICD-10-CM

## 2021-12-06 DIAGNOSIS — F32A Depression, unspecified: Secondary | ICD-10-CM

## 2021-12-06 DIAGNOSIS — H579 Unspecified disorder of eye and adnexa: Secondary | ICD-10-CM

## 2021-12-06 DIAGNOSIS — G629 Polyneuropathy, unspecified: Secondary | ICD-10-CM

## 2021-12-06 DIAGNOSIS — Z1231 Encounter for screening mammogram for malignant neoplasm of breast: Secondary | ICD-10-CM

## 2021-12-06 DIAGNOSIS — R0982 Postnasal drip: Secondary | ICD-10-CM

## 2021-12-06 DIAGNOSIS — K219 Gastro-esophageal reflux disease without esophagitis: Secondary | ICD-10-CM

## 2021-12-06 DIAGNOSIS — B370 Candidal stomatitis: Secondary | ICD-10-CM | POA: Diagnosis not present

## 2021-12-06 DIAGNOSIS — L298 Other pruritus: Secondary | ICD-10-CM | POA: Diagnosis not present

## 2021-12-06 DIAGNOSIS — R42 Dizziness and giddiness: Secondary | ICD-10-CM

## 2021-12-06 DIAGNOSIS — G4700 Insomnia, unspecified: Secondary | ICD-10-CM | POA: Diagnosis not present

## 2021-12-06 DIAGNOSIS — E538 Deficiency of other specified B group vitamins: Secondary | ICD-10-CM

## 2021-12-06 DIAGNOSIS — I2510 Atherosclerotic heart disease of native coronary artery without angina pectoris: Secondary | ICD-10-CM

## 2021-12-06 DIAGNOSIS — K5909 Other constipation: Secondary | ICD-10-CM

## 2021-12-06 DIAGNOSIS — E876 Hypokalemia: Secondary | ICD-10-CM | POA: Diagnosis not present

## 2021-12-06 DIAGNOSIS — J324 Chronic pansinusitis: Secondary | ICD-10-CM | POA: Diagnosis not present

## 2021-12-06 DIAGNOSIS — K582 Mixed irritable bowel syndrome: Secondary | ICD-10-CM

## 2021-12-06 DIAGNOSIS — M5412 Radiculopathy, cervical region: Secondary | ICD-10-CM

## 2021-12-06 DIAGNOSIS — J309 Allergic rhinitis, unspecified: Secondary | ICD-10-CM

## 2021-12-06 LAB — COMPREHENSIVE METABOLIC PANEL
ALT: 16 U/L (ref 0–35)
AST: 18 U/L (ref 0–37)
Albumin: 4.2 g/dL (ref 3.5–5.2)
Alkaline Phosphatase: 80 U/L (ref 39–117)
BUN: 14 mg/dL (ref 6–23)
CO2: 24 mEq/L (ref 19–32)
Calcium: 9.5 mg/dL (ref 8.4–10.5)
Chloride: 108 mEq/L (ref 96–112)
Creatinine, Ser: 1.08 mg/dL (ref 0.40–1.20)
GFR: 54.74 mL/min — ABNORMAL LOW (ref >60.00)
Glucose, Bld: 91 mg/dL (ref 70–99)
Potassium: 3.7 mEq/L (ref 3.5–5.1)
Sodium: 141 mEq/L (ref 135–145)
Total Bilirubin: 0.4 mg/dL (ref 0.2–1.2)
Total Protein: 7.2 g/dL (ref 6.0–8.3)

## 2021-12-06 LAB — CBC WITH DIFFERENTIAL/PLATELET
Basophils Absolute: 0 10*3/uL (ref 0.0–0.1)
Basophils Relative: 0.7 % (ref 0.0–3.0)
Eosinophils Absolute: 0.1 10*3/uL (ref 0.0–0.7)
Eosinophils Relative: 2.2 % (ref 0.0–5.0)
HCT: 44.6 % (ref 36.0–46.0)
Hemoglobin: 14.5 g/dL (ref 12.0–15.0)
Lymphocytes Relative: 43.8 % (ref 12.0–46.0)
Lymphs Abs: 2.6 10*3/uL (ref 0.7–4.0)
MCHC: 32.6 g/dL (ref 30.0–36.0)
MCV: 96.6 fl (ref 78.0–100.0)
Monocytes Absolute: 0.4 10*3/uL (ref 0.1–1.0)
Monocytes Relative: 7.4 % (ref 3.0–12.0)
Neutro Abs: 2.8 10*3/uL (ref 1.4–7.7)
Neutrophils Relative %: 45.9 % (ref 43.0–77.0)
Platelets: 142 10*3/uL — ABNORMAL LOW (ref 150.0–400.0)
RBC: 4.61 Mil/uL (ref 3.87–5.11)
RDW: 13.8 % (ref 11.5–15.5)
WBC: 6 10*3/uL (ref 4.0–10.5)

## 2021-12-06 LAB — LIPID PANEL
Cholesterol: 134 mg/dL (ref 0–200)
HDL: 43.7 mg/dL (ref >39.00)
LDL Cholesterol: 68 mg/dL (ref 0–99)
NonHDL: 90.51
Total CHOL/HDL Ratio: 3
Triglycerides: 113 mg/dL (ref 0.0–149.0)
VLDL: 22.6 mg/dL (ref 0.0–40.0)

## 2021-12-06 LAB — HEMOGLOBIN A1C: Hgb A1c MFr Bld: 6.3 % (ref 4.6–6.5)

## 2021-12-06 MED ORDER — TRELEGY ELLIPTA 100-62.5-25 MCG/ACT IN AEPB: 1.0000 | INHALATION_SPRAY | Freq: Every day | RESPIRATORY_TRACT | 11 refills | Status: DC

## 2021-12-06 MED ORDER — EZETIMIBE 10 MG PO TABS: 10.0000 mg | ORAL_TABLET | Freq: Every day | ORAL | 3 refills | Status: DC

## 2021-12-06 MED ORDER — DAPAGLIFLOZIN PROPANEDIOL 10 MG PO TABS: 10.0000 mg | ORAL_TABLET | Freq: Every day | ORAL | 3 refills | Status: DC

## 2021-12-06 MED ORDER — FUROSEMIDE 20 MG PO TABS: ORAL_TABLET | ORAL | 3 refills | Status: DC

## 2021-12-06 MED ORDER — CYCLOBENZAPRINE HCL 5 MG PO TABS: 5.0000 mg | ORAL_TABLET | Freq: Every day | ORAL | 11 refills | Status: DC | PRN

## 2021-12-06 MED ORDER — TRAZODONE HCL 50 MG PO TABS: 25.0000 mg | ORAL_TABLET | Freq: Every evening | ORAL | 3 refills | Status: DC | PRN

## 2021-12-06 MED ORDER — ESOMEPRAZOLE MAGNESIUM 40 MG PO CPDR: 40.0000 mg | DELAYED_RELEASE_CAPSULE | Freq: Every day | ORAL | 3 refills | Status: DC

## 2021-12-06 MED ORDER — FLUTICASONE PROPIONATE 50 MCG/ACT NA SUSP: 2.0000 | Freq: Every day | NASAL | 3 refills | Status: DC | PRN

## 2021-12-06 MED ORDER — IPRATROPIUM BROMIDE 0.06 % NA SOLN: 2.0000 | Freq: Four times a day (QID) | NASAL | 12 refills | Status: DC

## 2021-12-06 MED ORDER — LINACLOTIDE 72 MCG PO CAPS: ORAL_CAPSULE | ORAL | 3 refills | Status: DC

## 2021-12-06 MED ORDER — HYDROXYZINE HCL 25 MG PO TABS: ORAL_TABLET | ORAL | 11 refills | Status: DC

## 2021-12-06 MED ORDER — VITAMIN B-12 1000 MCG SL SUBL: 1.0000 ug | SUBLINGUAL_TABLET | Freq: Every day | SUBLINGUAL | 3 refills | Status: DC

## 2021-12-06 MED ORDER — PAROXETINE HCL 20 MG PO TABS: 20.0000 mg | ORAL_TABLET | Freq: Every day | ORAL | 3 refills | Status: DC

## 2021-12-06 MED ORDER — GEMTESA 75 MG PO TABS: 75.0000 mg | ORAL_TABLET | Freq: Every day | ORAL | 3 refills | Status: DC

## 2021-12-06 MED ORDER — MECLIZINE HCL 25 MG PO TABS: ORAL_TABLET | ORAL | 11 refills | Status: DC

## 2021-12-06 MED ORDER — IPRATROPIUM-ALBUTEROL 0.5-2.5 (3) MG/3ML IN SOLN: 3.0000 mL | Freq: Two times a day (BID) | RESPIRATORY_TRACT | 11 refills | Status: DC | PRN

## 2021-12-06 MED ORDER — AZELASTINE HCL 0.15 % NA SOLN: 2.0000 | Freq: Every day | NASAL | 11 refills | Status: DC | PRN

## 2021-12-06 MED ORDER — LEVOCETIRIZINE DIHYDROCHLORIDE 5 MG PO TABS: 5.0000 mg | ORAL_TABLET | Freq: Every evening | ORAL | 3 refills | Status: DC | PRN

## 2021-12-06 MED ORDER — SALINE SPRAY 0.65 % NA SOLN: 2.0000 | Freq: Every day | NASAL | 11 refills | Status: DC | PRN

## 2021-12-06 MED ORDER — PREGABALIN 75 MG PO CAPS: 75.0000 mg | ORAL_CAPSULE | Freq: Two times a day (BID) | ORAL | 5 refills | Status: DC

## 2021-12-06 MED ORDER — POTASSIUM CHLORIDE CRYS ER 10 MEQ PO TBCR: 10.0000 meq | EXTENDED_RELEASE_TABLET | Freq: Every day | ORAL | 3 refills | Status: DC

## 2021-12-06 MED ORDER — NITROGLYCERIN 0.4 MG SL SUBL: SUBLINGUAL_TABLET | SUBLINGUAL | 2 refills | Status: DC

## 2021-12-06 MED ORDER — LOSARTAN POTASSIUM 25 MG PO TABS: 12.5000 mg | ORAL_TABLET | Freq: Every day | ORAL | 3 refills | Status: DC

## 2021-12-06 MED ORDER — ASPIRIN EC 81 MG PO TBEC: 81.0000 mg | DELAYED_RELEASE_TABLET | Freq: Every day | ORAL | 3 refills | Status: DC

## 2021-12-06 MED ORDER — METOPROLOL SUCCINATE ER 50 MG PO TB24: ORAL_TABLET | ORAL | 3 refills | Status: DC

## 2021-12-06 MED ORDER — MONTELUKAST SODIUM 10 MG PO TABS: 10.0000 mg | ORAL_TABLET | Freq: Every day | ORAL | 3 refills | Status: DC

## 2021-12-06 MED ORDER — DICYCLOMINE HCL 10 MG PO CAPS: 10.0000 mg | ORAL_CAPSULE | Freq: Three times a day (TID) | ORAL | 11 refills | Status: DC

## 2021-12-06 MED ORDER — OLOPATADINE HCL 0.1 % OP SOLN: 1.0000 [drp] | Freq: Two times a day (BID) | OPHTHALMIC | 11 refills | Status: DC

## 2021-12-06 MED ORDER — ALBUTEROL SULFATE HFA 108 (90 BASE) MCG/ACT IN AERS: 2.0000 | INHALATION_SPRAY | Freq: Four times a day (QID) | RESPIRATORY_TRACT | 12 refills | Status: DC | PRN

## 2021-12-06 MED ORDER — ROSUVASTATIN CALCIUM 20 MG PO TABS: 20.0000 mg | ORAL_TABLET | Freq: Every day | ORAL | 3 refills | Status: DC

## 2021-12-06 NOTE — Progress Notes (Signed)
Chief Complaint  ?Patient presents with  ? Follow-up  ?  Disc about pelvic therapy they recommended seeing nephrology. Requesting referral. Not received gemtesa due to insurance not covering and too expensive.  ? ?F/u  ?1. Overactive bladder not able to get gemtasa 75 mg qd approved myrbetriq 25 , oxybutnin 5 mg qd did not help  ?She wants referral for pelvic PT ?2. Dm 2 and htn htn controlled fasting for labs today on farxiga 10 mg and and toprol xl 50  qd and losartan 12.5 mg qd, lasix 20 mg qd to bid prn  ? ? ?Review of Systems  ?Constitutional:  Negative for weight loss.  ?HENT:  Negative for hearing loss.   ?Eyes:  Negative for blurred vision.  ?Respiratory:  Negative for shortness of breath.   ?Cardiovascular:  Negative for chest pain.  ?Gastrointestinal:  Negative for abdominal pain and blood in stool.  ?Genitourinary:  Negative for dysuria.  ?Musculoskeletal:  Negative for falls and joint pain.  ?Skin:  Negative for rash.  ?Neurological:  Negative for headaches.  ?Psychiatric/Behavioral:  Negative for depression.   ?Past Medical History:  ?Diagnosis Date  ? Allergy   ? Anemia   ? Arthritis   ? Asthma   ? Bacterial vaginitis   ? Blood in stool   ? Brachial neuritis or radiculitis NOS   ? 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 being followed by neurosurgery/eye doctor and pcp. monitoring size.causes sinus problems  ? Bronchitis   ? Cardiac arrhythmia   ? Cervical neck pain with evidence of disc disease   ? C5/6 disease, MRI done late 2012 - no records available  ? Chronic combined systolic and diastolic CHF (congestive heart failure) ([place])   ? a. 08/2010 Echo: mildly reduced EF 40-45%, mild diffuse hypokinesis; b. 06/2016 Echo: EF 50%, no rwma, mild to mod TR; c. 03/2017 Echo: EF 40-45%, Gr1 DD (prior echo reviewed and EF felt to be lower than reported).  ? Chronic pain   ? Cocaine abuse, in remission ([place])   ? clean x 24 years  ? Colon polyps   ? COPD (chronic obstructive pulmonary disease)  ([place])   ? a. 12/2018 PFT: No obvious obst/restrictive dzs.  ? Coronary artery disease   ? a. PCI of LCX 2003; b. PCI of the LAD 2012 with a (2.5 x 8 mm BMS);  c.s/p CABG 4/12: L-LAD, VG-Dx, VG-OM, VG-RCA (Dr. Prescott Gum);  d. 01/2012 MV: inf infarct, attenuation, no ischemia; e. 02/2017 MV: signifi attenuation artifact. Fixed basal antlat/inflat scar vs artifact. Reversible apical lat and mid antlat defect - ? atten vs ischemia. F/u echo w/o wma->Med rx.  ? Depression   ? Diabetes mellitus without complication ([place])   ? Family history of colon cancer   ? Generalized headaches   ? frequent  ? GERD (gastroesophageal reflux disease)   ? Headache   ? Heart murmur   ? Hematochezia   ? Hepatitis   ? history of hepatitis b  ? History of cervical cancer   ? s/p cryotherapy  ? History of drug abuse ([place])   ? cocaine, marijuana, clean since 1989  ? History of hepatitis B   ? from eating undercooked liver  ? History of MI (myocardial infarction)   ? Hyperlipidemia   ? Hypertension   ? Ischemic cardiomyopathy   ? a. 03/2017 Echo: EF 40-45%, Gr1 DD.  ? Myocardial infarction [facility]) 2003, 2012  ? PAD (peripheral artery disease) ([place])   ?  a. s/p Right SFA atherectomy and PTA 01/15/11; b. 07/2018 ABI: R 1.02, L 1.09.  ? 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 a. 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 Mass abuts R cavernous sinus w/o definite invasion.  ? Polyp of colon   ? Seasonal allergies   ? Sleep apnea   ? uses cpap  ? Smoking history   ? quit 07/2010  ? Thyroid disease   ? Urinary incontinence   ? ?Past Surgical History:  ?Procedure Laterality Date  ? ABDOMINAL HYSTERECTOMY    ? 2003  ? CHOLECYSTECTOMY  1986  ? COLONOSCOPY  2008  ? 3 polyps  ? COLONOSCOPY WITH PROPOFOL N/A 02/06/2015  ? Procedure: COLONOSCOPY WITH PROPOFOL;  Surgeon: DLucilla Lame MD;  Location: [facility] ENDOSCOPY;  Service: Endoscopy;  Laterality: N/A;  ? COLONOSCOPY WITH PROPOFOL N/A 01/27/2017  ? Procedure: COLONOSCOPY WITH PROPOFOL;  Surgeon: WLucilla Lame MD;   [facility];  Service: Endoscopy;  Laterality: N/A;  ? CORONARY ANGIOPLASTY    ? w/ stent placement x2  ? CORONARY ARTERY BYPASS GRAFT  2012  ? Dr GRockey Situ ? CORONARY STENT PLACEMENT  2003  ? S/P MI  ? CORONARY STENT PLACEMENT  2007  ? [place]  ? ESOPHAGOGASTRODUODENOSCOPY (EGD) WITH PROPOFOL N/A 02/06/2015  ? Procedure: ESOPHAGOGASTRODUODENOSCOPY (EGD) WITH PROPOFOL;  Surgeon: DLucilla Lame MD;  Location: [facility] ENDOSCOPY;  Service: Endoscopy;  Laterality: N/A;  ? ESOPHAGOGASTRODUODENOSCOPY (EGD) WITH PROPOFOL N/A 01/27/2017  ? Procedure: ESOPHAGOGASTRODUODENOSCOPY (EGD) WITH PROPOFOL;  Surgeon: WLucilla Lame MD;  [facility];  Service: Endoscopy;  Laterality: N/A;  ? ESOPHAGOGASTRODUODENOSCOPY (EGD) WITH PROPOFOL N/A 09/01/2019  ? Procedure: ESOPHAGOGASTRODUODENOSCOPY (EGD) WITH PROPOFOL;  Surgeon: VLin Landsman MD;  [facility];  Service: Gastroenterology;  Laterality: N/A;  ? FEMORAL ARTERY STENT  10/2010  ? right sided (Dr. CBurt Knack  ? KNEE ARTHROSCOPY WITH MEDIAL MENISECTOMY Right 08/20/2017  ? Procedure: KNEE ARTHROSCOPY WITH MEDIAL  AND LATERAL MENISECTOMY;  Surgeon: MHessie Knows MD;  Location: [facility] ORS;  Service: Orthopedics;  Laterality: Right;  ? POSTERIOR CERVICAL LAMINECTOMY N/A 03/08/2018  ? Procedure: POSTERIOR CERVICAL LAMINECTOMY-C7;  Surgeon: CDeetta Perla MD;  Location: [facility] ORS;  Service: Neurosurgery;  Laterality: N/A;  ? TONSILLECTOMY    ? TOTAL KNEE ARTHROPLASTY Right 04/14/2019  ? Procedure: RIGHT TOTAL KNEE ARTHROPLASTY;  Surgeon: MHessie Knows MD;  Location: [facility] ORS;  Service: Orthopedics;  Laterality: Right;  ? TUBAL LIGATION    ? ?Family History  ?Problem Relation Age of Onset  ? Hypertension Father   ? Heart failure Father   ? Diabetes Father   ? Colon cancer Father 582 ? Glaucoma Father   ? Cancer Father   ?     colorectal   ? Heart disease Father   ? Other Father   ?     glaucoma  ? Breast cancer Mother 632 ?     breast cancer, late 650's ? Cancer  Mother   ?     breast  ? Brain cancer Sister   ? Arthritis Sister   ? Diabetes Sister   ? Hypertension Sister   ? Kidney disease Sister   ? Diabetes Brother   ? Hypertension Brother   ? Other Sister   ?     brain tumor   ? Acute myelogenous leukemia Grandson   ?     06/2019  ? Coronary artery disease Neg Hx   ? Stroke Neg Hx   ? ?  Social History  ? ?Socioeconomic History  ? Marital status: Legally Separated  ?  Spouse name: Not on file  ? Number of children: Not on file  ? Years of education: Not on file  ? Highest education level: Not on file  ?Occupational History  ? Not on file  ?Tobacco Use  ? Smoking status: Former  ?  Packs/day: 1.00  ?  Years: 38.00  ?  Pack years: 38.00  ?  Types: Cigarettes  ?  Quit date: 08/12/2010  ?  Years since quitting: 11.3  ? Smokeless tobacco: Never  ?Vaping Use  ? Vaping Use: Never used  ?Substance and Sexual Activity  ? Alcohol use: No  ? Drug use: No  ?  Types: Cocaine  ?  Comment: Remote Hx (crack cocaine and marijuana).none since 31yr plus  ? Sexual activity: Yes  ?Other Topics Concern  ? Not on file  ?Social History Narrative  ? Caffeine: 1 cup coffee/day  ? Lives with family, no pets  ? 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 Edu: 11th grade  ? Activity: no regular exercise  ? Diet: good water, vegetables daily, low salt diet  ? Lives with sister and other family   ? No guns, wears seat belts, safe in relationship   ? 2 kids   ? GED 1 year of college   ? ?Social Determinants of Health  ? ?Financial Resource Strain: Low Risk   ? Difficulty of Paying Living Expenses: Not hard at all  ?Food Insecurity: No Food Insecurity  ? Worried About RCharity fundraiserin the Last Year: Never true  ? Ran Out of Food in the Last Year: Never true  ?Transportation Needs: No Transportation Needs  ? Lack of Transportation (Medical): No  ? Lack of Transportation (Non-Medical): No  ?Physical Activity: Not on file  ?Stress: No Stress Concern Present  ? Feeling of Stress : Not at  all  ?Social Connections: Unknown  ? Frequency of Communication with Friends and Family: More than three times a week  ? Frequency of Social Gatherings with Friends and Family: Once a week  ? Attends Religi

## 2021-12-06 NOTE — Patient Instructions (Signed)
[name], PT, DPT, E-RYT ?Specialties and/or Subspecialties ?Physical Therapy ? ?[facility] ?[address] ?[place], [place] [address] ?[phone] ?

## 2021-12-12 DIAGNOSIS — K219 Gastro-esophageal reflux disease without esophagitis: Secondary | ICD-10-CM | POA: Diagnosis not present

## 2021-12-12 DIAGNOSIS — R8290 Unspecified abnormal findings in urine: Secondary | ICD-10-CM | POA: Diagnosis not present

## 2021-12-12 DIAGNOSIS — J45909 Unspecified asthma, uncomplicated: Secondary | ICD-10-CM | POA: Insufficient documentation

## 2021-12-12 DIAGNOSIS — I509 Heart failure, unspecified: Secondary | ICD-10-CM | POA: Diagnosis not present

## 2021-12-12 DIAGNOSIS — N1831 Chronic kidney disease, stage 3a: Secondary | ICD-10-CM | POA: Insufficient documentation

## 2021-12-12 DIAGNOSIS — I10 Essential (primary) hypertension: Secondary | ICD-10-CM | POA: Diagnosis not present

## 2021-12-12 DIAGNOSIS — F1911 Other psychoactive substance abuse, in remission: Secondary | ICD-10-CM | POA: Diagnosis not present

## 2021-12-12 DIAGNOSIS — E785 Hyperlipidemia, unspecified: Secondary | ICD-10-CM | POA: Diagnosis not present

## 2021-12-12 DIAGNOSIS — E1122 Type 2 diabetes mellitus with diabetic chronic kidney disease: Secondary | ICD-10-CM | POA: Diagnosis not present

## 2021-12-12 DIAGNOSIS — F1811 Inhalant abuse, in remission: Secondary | ICD-10-CM | POA: Insufficient documentation

## 2021-12-12 DIAGNOSIS — I779 Disorder of arteries and arterioles, unspecified: Secondary | ICD-10-CM | POA: Diagnosis not present

## 2021-12-12 DIAGNOSIS — I2510 Atherosclerotic heart disease of native coronary artery without angina pectoris: Secondary | ICD-10-CM | POA: Diagnosis not present

## 2021-12-12 DIAGNOSIS — J449 Chronic obstructive pulmonary disease, unspecified: Secondary | ICD-10-CM | POA: Diagnosis not present

## 2021-12-12 DIAGNOSIS — R609 Edema, unspecified: Secondary | ICD-10-CM | POA: Diagnosis not present

## 2021-12-13 ENCOUNTER — Other Ambulatory Visit: Payer: Self-pay | Admitting: Nephrology

## 2021-12-13 DIAGNOSIS — R8290 Unspecified abnormal findings in urine: Secondary | ICD-10-CM

## 2021-12-13 DIAGNOSIS — N1831 Chronic kidney disease, stage 3a: Secondary | ICD-10-CM

## 2021-12-17 DIAGNOSIS — M6281 Muscle weakness (generalized): Secondary | ICD-10-CM | POA: Diagnosis not present

## 2021-12-17 DIAGNOSIS — M542 Cervicalgia: Secondary | ICD-10-CM | POA: Diagnosis not present

## 2021-12-17 DIAGNOSIS — M25511 Pain in right shoulder: Secondary | ICD-10-CM | POA: Diagnosis not present

## 2021-12-22 DIAGNOSIS — G4733 Obstructive sleep apnea (adult) (pediatric): Secondary | ICD-10-CM | POA: Diagnosis not present

## 2021-12-22 DIAGNOSIS — I259 Chronic ischemic heart disease, unspecified: Secondary | ICD-10-CM | POA: Diagnosis not present

## 2021-12-22 DIAGNOSIS — G4710 Hypersomnia, unspecified: Secondary | ICD-10-CM | POA: Diagnosis not present

## 2021-12-25 ENCOUNTER — Encounter: Payer: Self-pay | Admitting: Internal Medicine

## 2021-12-25 ENCOUNTER — Ambulatory Visit (INDEPENDENT_AMBULATORY_CARE_PROVIDER_SITE_OTHER): Payer: Medicare HMO | Admitting: Internal Medicine

## 2021-12-25 VITALS — BP 120/80 | HR 71 | Temp 98.0°F | Resp 14 | Ht 65.0 in | Wt 216.0 lb

## 2021-12-25 DIAGNOSIS — L309 Dermatitis, unspecified: Secondary | ICD-10-CM

## 2021-12-25 DIAGNOSIS — M25511 Pain in right shoulder: Secondary | ICD-10-CM | POA: Diagnosis not present

## 2021-12-25 DIAGNOSIS — G8929 Other chronic pain: Secondary | ICD-10-CM | POA: Diagnosis not present

## 2021-12-25 DIAGNOSIS — M25522 Pain in left elbow: Secondary | ICD-10-CM

## 2021-12-25 MED ORDER — CLOBETASOL PROPIONATE 0.05 % EX CREA: 1.0000 "application " | TOPICAL_CREAM | Freq: Two times a day (BID) | CUTANEOUS | 0 refills | Status: DC

## 2021-12-25 NOTE — Progress Notes (Signed)
Chief Complaint  Patient presents with   Elbow Pain    L elbow has been an ongoing issue, has been applying otc vaseline and cream with minor relief. Denies any injuries tender to touch. Disc about recent visit with kidney dr 12/12/21   F/u  1. Left elbow pain ongoing x a while appt [facility] next week elbow crusty dark rash tried otc vasoline with minor relief tender to touch no injuries  2. 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 Chronic right shoulder pain moderate to severe shoulder pain and pain with ROM bending at elbow and moving arm upward motion EXAM: RIGHT SHOULDER - 2+ VIEW   COMPARISON:  June 26, 2011   FINDINGS: There is no evidence of fracture or dislocation. There is no evidence of arthropathy or other focal bone abnormality. Soft tissues are unremarkable. There is cardiomegaly. The patient appears to be status post prior CABG. Aortic calcifications are noted.   IMPRESSION: No acute displaced fracture or dislocation.     Electronically Signed   By: Constance Holster M.D.   On: 10/17/2020 12:18    Review of Systems  Constitutional:  Negative for weight loss.  HENT:  Negative for hearing loss.   Eyes:  Negative for blurred vision.  Respiratory:  Negative for shortness of breath.   Cardiovascular:  Negative for chest pain.  Gastrointestinal:  Negative for abdominal pain and blood in stool.  Genitourinary:  Negative for dysuria.  Musculoskeletal:  Positive for joint pain. Negative for falls.  Skin:  Negative for rash.  Neurological:  Negative for headaches.  Psychiatric/Behavioral:  Negative for depression.   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 being followed by neurosurgery/eye doctor and pcp. monitoring size.causes sinus problems   Bronchitis    Cardiac arrhythmia    Cervical neck pain with evidence of disc disease    C5/6 disease, MRI done late 2012 - no records available   Chronic combined systolic and diastolic CHF (congestive heart failure) ([place])    a. 08/2010 Echo: mildly reduced EF 40-45%, mild diffuse hypokinesis; b. 06/2016 Echo: EF 50%, no rwma, mild to mod TR; c. 03/2017 Echo: EF 40-45%, Gr1 DD (prior echo reviewed and EF felt to be lower than reported).   Chronic pain    Cocaine abuse, in remission (HCC)    clean x 24 years   Colon polyps    COPD (chronic obstructive pulmonary disease) ([place])    a. 12/2018 PFT: No obvious obst/restrictive dzs.   Coronary artery disease    a. PCI of LCX 2003; b. PCI of the LAD 2012 with a (2.5 x 8 mm BMS);  c.s/p CABG 4/12: L-LAD, VG-Dx, VG-OM, VG-RCA (Dr. Prescott Gum);  d. 01/2012 MV: inf infarct, attenuation, no ischemia; e. 02/2017 MV: signifi attenuation artifact. Fixed basal antlat/inflat scar vs artifact. Reversible apical lat and mid antlat defect - ?  atten vs ischemia. F/u echo w/o wma->Med rx.   Depression    Diabetes mellitus without complication ([place])    Family history of colon cancer    Generalized headaches    frequent   GERD (gastroesophageal reflux disease)    Headache    Heart murmur    Hematochezia    Hepatitis    history of hepatitis b   History of cervical cancer    s/p cryotherapy   History of drug abuse ([place])    cocaine, marijuana, clean since 1989   History of hepatitis B    from eating undercooked liver   History of MI (myocardial infarction)    Hyperlipidemia    Hypertension    Ischemic cardiomyopathy    a. 03/2017 Echo: EF 40-45%, Gr1 DD.   Myocardial infarction ([place]) 2003, 2012    PAD (peripheral artery disease) ([place])    a. s/p Right SFA atherectomy and PTA 01/15/11; b. 07/2018 ABI: R 1.02, L 1.09.   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 Mass abuts R cavernous sinus w/o definite invasion.   Polyp of colon    Seasonal allergies    Sleep apnea    uses cpap   Smoking history    quit 07/2010   Thyroid disease    Urinary incontinence    Past Surgical History:  Procedure Laterality Date   ABDOMINAL HYSTERECTOMY     2003   CHOLECYSTECTOMY  1986   COLONOSCOPY  2008   3 polyps   COLONOSCOPY WITH PROPOFOL N/A 02/06/2015   Procedure: COLONOSCOPY WITH PROPOFOL;  Surgeon: DLucilla Lame MD;  Location: [facility] END[facility];  Service: Endoscopy;  Laterality: N/A;   COLONOSCOPY WITH PROPOFOL N/A 01/27/2017   Procedure: COLONOSCOPY WITH PROPOFOL;  Surgeon: WLucilla Lame MD;  [facility];  Service: Endoscopy;  Laterality: N/A;   CORONARY ANGIOPLASTY     w/ stent placement x2   CORONARY ARTERY BYPASS GRAFT  2012   Dr GRockey Situ  CORONARY STENT PLACEMENT  2003   S/P MI   CORONARY STENT PLACEMENT  2007   [place]   ESOPHAGOGASTRODUODENOSCOPY (EGD) WITH PROPOFOL N/A 02/06/2015   Procedure: ESOPHAGOGASTRODUODENOSCOPY (EGD) WITH PROPOFOL;  Surgeon: DLucilla Lame MD;  Location: [facility] ENDOSCOPY;  Service: Endoscopy;  Laterality: N/A;   ESOPHAGOGASTRODUODENOSCOPY (EGD) WITH PROPOFOL N/A 01/27/2017   Procedure: ESOPHAGOGASTRODUODENOSCOPY (EGD) WITH PROPOFOL;  Surgeon: WLucilla Lame MD;  Location: [facility] END[facility];  Service: Endoscopy;  Laterality: N/A;   ESOPHAGOGASTRODUODENOSCOPY (EGD) WITH PROPOFOL N/A 09/01/2019   Procedure: ESOPHAGOGASTRODUODENOSCOPY (EGD) WITH PROPOFOL;  Surgeon: VLin Landsman MD;  [facility]  Service: Gastroenterology;  Laterality: N/A;   FEMORAL ARTERY STENT  10/2010   right sided (Dr. CBurt Knack   KNEE ARTHROSCOPY WITH MEDIAL MENISECTOMY Right 08/20/2017   Procedure: KNEE ARTHROSCOPY WITH MEDIAL  AND  LATERAL MENISECTOMY;  Surgeon: MHessie Knows MD;  Location: [facility];  Service: Orthopedics;  Laterality: Right;   POSTERIOR CERVICAL LAMINECTOMY N/A 03/08/2018   Procedure: POSTERIOR CERVICAL LAMINECTOMY-C7;  Surgeon: CDeetta Perla MD;  Location: [facility];  Service: Neurosurgery;  Laterality: N/A;   TONSILLECTOMY     TOTAL KNEE ARTHROPLASTY Right 04/14/2019   Procedure: RIGHT TOTAL KNEE ARTHROPLASTY;  Surgeon: MHessie Knows MD;  Location: [facility] OR[facility];  Service: Orthopedics;  Laterality: Right;   TUBAL LIGATION     Family History  Problem Relation Age of Onset   Hypertension Father    Heart failure Father    Diabetes  Father    Colon cancer Father 47   Glaucoma Father    Cancer Father        colorectal    Heart disease Father    Other Father        glaucoma   Breast cancer Mother 42       breast cancer, late 63's   Cancer Mother        breast   Brain cancer Sister    Arthritis Sister    Diabetes Sister    Hypertension Sister    Kidney disease Sister    Diabetes Brother    Hypertension Brother    Other Sister        brain tumor    Acute myelogenous leukemia Grandson        06/2019   Coronary artery disease Neg Hx    Stroke Neg Hx    Social History   Socioeconomic History   Marital status: Legally Separated    Spouse name: Not on file   Number of children: Not on file   Years of education: Not on file   Highest education level: Not on file  Occupational History   Not on file  Tobacco Use   Smoking status: Former    Packs/day: 1.00    Years: 38.00    Pack years: 38.00    Types: Cigarettes    Quit date: 08/12/2010    Years since quitting: 11.3   Smokeless tobacco: Never  Vaping Use   Vaping Use: Never used  Substance and Sexual Activity   Alcohol use: No   Drug use: No    Types: Cocaine    Comment: Remote Hx (crack cocaine and marijuana).none since 31yr plus   Sexual activity: Yes  Other Topics Concern   Not on file  Social History Narrative   Caffeine: 1  cup coffee/day   Lives with family, no pets   Occupation: industrial work, 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   Edu: 11th grade   Activity: no regular exercise   Diet: good water, vegetables daily, low salt diet   Lives with sister and other family    No guns, wears seat belts, safe in relationship    2 kids    GED 1 year of college    Social Determinants of Health   Financial Resource Strain: Low Risk    Difficulty of Paying Living Expenses: Not hard at all  Food Insecurity: No Food Insecurity   Worried About RCharity fundraiserin the Last Year: Never true   RArboriculturistin the Last Year: Never true  Transportation Needs: No Transportation Needs   Lack of Transportation (Medical): No   Lack of Transportation (Non-Medical): No  Physical Activity: Not on file  Stress: No Stress Concern Present   Feeling of Stress : Not at all  Social Connections: Unknown   Frequency of Communication with Friends and Family: More than three times a week   Frequency of Social Gatherings with Friends and Family: Once a week   Attends Religious Services: 1 to 4 times per year   Active Member of [employer] Organizations: Yes   Attends CMusic therapist More than 4 times per year   Marital Status: Not on file  Intimate Partner Violence: Not At Risk   Fear of Current or Ex-Partner: No   Emotionally Abused: No   Physically Abused: No   Sexually Abused: No   Current Meds  Medication Sig   albuterol (VENTOLIN  HFA) 108 (90 Base) MCG/ACT inhaler Inhale 2 puffs into the lungs every 6 (six) hours as needed for wheezing or shortness of breath.   aspirin EC 81 MG tablet Take 1 tablet (81 mg total) by mouth daily.   Azelastine HCl 0.15 % SOLN Place 2 sprays into both nostrils daily as needed (allergies).   benzonatate (TESSALON) 100 MG capsule Take 1 capsule (100 mg total) by mouth 3 (three) times daily as needed for cough.   Cyanocobalamin (VITAMIN B-12) 1000 MCG SUBL Place 0.001 tablets (1 mcg  total) under the tongue daily.   cyclobenzaprine (FLEXERIL) 5 MG tablet Take 1-2 tablets (5-10 mg total) by mouth daily as needed for muscle spasms.   dapagliflozin propanediol (FARXIGA) 10 MG TABS tablet Take 1 tablet (10 mg total) by mouth daily before breakfast.   dicyclomine (BENTYL) 10 MG capsule Take 1 capsule (10 mg total) by mouth 3 (three) times daily before meals. As needed for abdominal pain   esomeprazole (NEXIUM) 40 MG capsule Take 1 capsule (40 mg total) by mouth daily.   Evolocumab (REPATHA SURECLICK) 921 MG/ML SOAJ INJECT THE CONTENTS OF 1 PEN INTO THE SKIN ONCE EVERY 14 DAYS   ezetimibe (ZETIA) 10 MG tablet Take 1 tablet (10 mg total) by mouth daily.   fluticasone (FLONASE) 50 MCG/ACT nasal spray Place 2 sprays into both nostrils daily as needed for allergies. Max dose 2 sprays each nostril   Fluticasone-Umeclidin-Vilant (TRELEGY ELLIPTA) 100-62.5-25 MCG/ACT AEPB Inhale 1 puff into the lungs daily. Rinse mouth   furosemide (LASIX) 20 MG tablet Qd and 2nd tablet prn for edema   hydrOXYzine (ATARAX) 25 MG tablet TAKE 1 TO 2 TABLETS BY MOUTH ONCE DAILY AS NEEDED   ipratropium (ATROVENT) 0.06 % nasal spray Place 2 sprays into both nostrils 4 (four) times daily.   ipratropium-albuterol (DUONEB) 0.5-2.5 (3) MG/3ML SOLN Take 3 mLs by nebulization 2 (two) times daily as needed (wheezing/shortness of breath).   isosorbide mononitrate (IMDUR) 30 MG 24 hr tablet Take 1 tablet by mouth twice daily   levocetirizine (XYZAL) 5 MG tablet Take 1 tablet (5 mg total) by mouth at bedtime as needed for allergies.   linaclotide (LINZESS) 72 MCG capsule TAKE 1 CAPSULE BY MOUTH ONCE DAILY BEFORE BREAKFAST   losartan (COZAAR) 25 MG tablet Take 0.5 tablets (12.5 mg total) by mouth daily.   meclizine (ANTIVERT) 25 MG tablet TAKE 1 TABLET BY MOUTH TWICE DAILY AS NEEDED FOR DIZZINESS   metoprolol succinate (TOPROL-XL) 50 MG 24 hr tablet TAKE 1 TABLET BY MOUTH ONCE DAILY WITH OR IMMEDIATELY FOLLOWING A MEAL    montelukast (SINGULAIR) 10 MG tablet Take 1 tablet (10 mg total) by mouth at bedtime.   nitroGLYCERIN (NITROSTAT) 0.4 MG SL tablet DISSOLVE ONE TABLET UNDER THE TONGUE EVERY 5 MINUTES AS NEEDED FOR CHEST PAIN.  DO NOT EXCEED A TOTAL OF 3 DOSES IN 15 MINUTES   nystatin (MYCOSTATIN) 100000 UNIT/ML suspension Take 5 mLs (500,000 Units total) by mouth 4 (four) times daily. (Patient taking differently: Take 5 mLs by mouth as needed.)   olopatadine (PATANOL) 0.1 % ophthalmic solution Place 1 drop into both eyes 2 (two) times daily.   ondansetron (ZOFRAN) 4 MG tablet Take 1 tablet (4 mg total) by mouth every 8 (eight) hours as needed for nausea or vomiting.   OneTouch Delica Lancets 19E MISC USE TO CHECK GLUCOSE IN THE MORNING AND AT BEDTIME   ONETOUCH VERIO test strip USE 1 STRIP TO CHECK GLUCOSE TWICE DAILY ONCE  IN  THE  MORNING  AND  ONCE  AT  BEDTIME   PARoxetine (PAXIL) 20 MG tablet Take 1 tablet (20 mg total) by mouth daily.   potassium chloride (KLOR-CON M) 10 MEQ tablet Take 1 tablet (10 mEq total) by mouth daily.   pregabalin (LYRICA) 75 MG capsule Take 1 capsule (75 mg total) by mouth 2 (two) times daily.   rivaroxaban (XARELTO) 2.5 MG TABS tablet Take 1 tablet by mouth twice daily   rosuvastatin (CRESTOR) 20 MG tablet Take 1 tablet (20 mg total) by mouth at bedtime.   sodium chloride (OCEAN) 0.65 % SOLN nasal spray Place 2 sprays into both nostrils daily as needed for congestion.   traZODone (DESYREL) 50 MG tablet Take 0.5-1 tablets (25-50 mg total) by mouth at bedtime as needed for sleep.   Vibegron (GEMTESA) 75 MG TABS Take 75 mg by mouth daily. D/c myrbetriq   [DISCONTINUED] clobetasol cream (TEMOVATE) 2.40 % Apply 1 application. topically 2 (two) times daily. Prn left elbow   Allergies  Allergen Reactions   Latex Rash        Shellfish Allergy Anaphylaxis    Hard shellfish/swelling in throat   Sulfonamide Derivatives Anaphylaxis    Swelling in throat.   Watermelon [Citrullus  Vulgaris] Other (See Comments)    Throat itchy   Penicillins Itching   Soy Allergy     Diarrhea/upset stomach     Tomato Itching    Throat itchy - also Raw carrots   Other Swelling   Recent Results (from the past 2160 hour(s))  Comprehensive metabolic panel     Status: Abnormal   Collection Time: 12/06/21 12:35 PM  Result Value Ref Range   Sodium 141 135 - 145 mEq/L   Potassium 3.7 3.5 - 5.1 mEq/L   Chloride 108 96 - 112 mEq/L   CO2 24 19 - 32 mEq/L   Glucose, Bld 91 70 - 99 mg/dL   BUN 14 6 - 23 mg/dL   Creatinine, Ser 1.08 0.40 - 1.20 mg/dL   Total Bilirubin 0.4 0.2 - 1.2 mg/dL   Alkaline Phosphatase 80 39 - 117 U/L   AST 18 0 - 37 U/L   ALT 16 0 - 35 U/L   Total Protein 7.2 6.0 - 8.3 g/dL   Albumin 4.2 3.5 - 5.2 g/dL   GFR 54.74 (L) >60.00 mL/min    Comment: Calculated using the CKD-EPI Creatinine Equation (2021)   Calcium 9.5 8.4 - 10.5 mg/dL  Lipid panel     Status: None   Collection Time: 12/06/21 12:35 PM  Result Value Ref Range   Cholesterol 134 0 - 200 mg/dL    Comment: ATP III Classification       Desirable:  < 200 mg/dL               Borderline High:  200 - 239 mg/dL          High:  > = 240 mg/dL   Triglycerides 113.0 0.0 - 149.0 mg/dL    Comment: Normal:  <150 mg/dLBorderline High:  150 - 199 mg/dL   HDL 43.70 >39.00 mg/dL   VLDL 22.6 0.0 - 40.0 mg/dL   LDL Cholesterol 68 0 - 99 mg/dL   Total CHOL/HDL Ratio 3     Comment:                Men          Women1/2 Average Risk     3.4  3.3Average Risk          5.0          4.42X Average Risk          9.6          7.13X Average Risk          15.0          11.0                       NonHDL 90.51     Comment: NOTE:  Non-HDL goal should be 30 mg/dL higher than patient's LDL goal (i.e. LDL goal of < 70 mg/dL, would have non-HDL goal of < 100 mg/dL)  Hemoglobin A1c     Status: None   Collection Time: 12/06/21 12:35 PM  Result Value Ref Range   Hgb A1c MFr Bld 6.3 4.6 - 6.5 %    Comment: Glycemic Control  Guidelines for People with Diabetes:Non Diabetic:  <6%Goal of Therapy: <7%Additional Action Suggested:  >8%   CBC with Differential/Platelet     Status: Abnormal   Collection Time: 12/06/21 12:35 PM  Result Value Ref Range   WBC 6.0 4.0 - 10.5 K/uL   RBC 4.61 3.87 - 5.11 Mil/uL   Hemoglobin 14.5 12.0 - 15.0 g/dL   HCT 44.6 36.0 - 46.0 %   MCV 96.6 78.0 - 100.0 fl   MCHC 32.6 30.0 - 36.0 g/dL   RDW 13.8 11.5 - 15.5 %   Platelets 142.0 (L) 150.0 - 400.0 K/uL   Neutrophils Relative % 45.9 43.0 - 77.0 %   Lymphocytes Relative 43.8 12.0 - 46.0 %   Monocytes Relative 7.4 3.0 - 12.0 %   Eosinophils Relative 2.2 0.0 - 5.0 %   Basophils Relative 0.7 0.0 - 3.0 %   Neutro Abs 2.8 1.4 - 7.7 K/uL   Lymphs Abs 2.6 0.7 - 4.0 K/uL   Monocytes Absolute 0.4 0.1 - 1.0 K/uL   Eosinophils Absolute 0.1 0.0 - 0.7 K/uL   Basophils Absolute 0.0 0.0 - 0.1 K/uL   Objective  Body mass index is 35.94 kg/m. Wt Readings from Last 3 Encounters:  12/25/21 216 lb (98 kg)  12/06/21 215 lb 6.4 oz (97.7 kg)  09/04/21 219 lb (99.3 kg)   Temp Readings from Last 3 Encounters:  12/25/21 98 F (36.7 C) (Oral)  12/06/21 98.5 F (36.9 C) (Oral)  08/26/21 (!) 97.1 F (36.2 C) (Oral)   BP Readings from Last 3 Encounters:  12/25/21 120/80  12/06/21 116/70  09/04/21 114/81   Pulse Readings from Last 3 Encounters:  12/25/21 71  12/06/21 65  09/04/21 73    Physical Exam Vitals and nursing note reviewed.  Constitutional:      Appearance: Normal appearance. She is well-developed and well-groomed.  HENT:     Head: Normocephalic and atraumatic.  Eyes:     Conjunctiva/sclera: Conjunctivae normal.     Pupils: Pupils are equal, round, and reactive to light.  Cardiovascular:     Rate and Rhythm: Normal rate and regular rhythm.     Heart sounds: Normal heart sounds. No murmur heard. Pulmonary:     Effort: Pulmonary effort is normal.     Breath sounds: Normal breath sounds.  Abdominal:     General: Abdomen is  flat. Bowel sounds are normal.     Tenderness: There is no abdominal tenderness.  Musculoskeletal:     Left elbow: Swelling present. Tenderness present in olecranon process.  Skin:  General: Skin is warm and dry.  Neurological:     General: No focal deficit present.     Mental Status: She is alert and oriented to person, place, and time. Mental status is at baseline.     Cranial Nerves: Cranial nerves 2-12 are intact.     Motor: Motor function is intact.     Coordination: Coordination is intact.     Gait: Gait is intact.  Psychiatric:        Attention and Perception: Attention and perception normal.        Mood and Affect: Mood and affect normal.        Speech: Speech normal.        Behavior: Behavior normal. Behavior is cooperative.        Thought Content: Thought content normal.        Cognition and Memory: Cognition and memory normal.        Judgment: Judgment normal.    Assessment  Plan  Left elbow pain - Plan: DG Elbow Complete Left will see if thomas gaines can do Xray in 1 week when she sees him  Eczema,  - Plan: clobetasol cream (TEMOVATE) 0.05 %, bid left elbow  Ok to continue vasoline  Chronic right shoulder pain - Plan: MR SHOULDER RIGHT WO CONTRAST Tried PT and not helping shoulder pain   EXAM: RIGHT SHOULDER - 2+ VIEW   COMPARISON:  June 26, 2011   FINDINGS: There is no evidence of fracture or dislocation. There is no evidence of arthropathy or other focal bone abnormality. Soft tissues are unremarkable. There is cardiomegaly. The patient appears to be status post prior CABG. Aortic calcifications are noted.   IMPRESSION: No acute displaced fracture or dislocation.     Electronically Signed   By: Constance Holster M.D.   On: 10/17/2020 12:18  HM Flu shot utd Consider shingrix in future at pharmacy given Rx 01/10/21  pna 23 had 03/10/16 consider in future  covid 2/2  pfizer consider booster disc prev Tdap 05/05/2019 Consider prevnar 62 in  future   S/p hysterectomy no cervix and f/u OB/GYN h/o abnormal pap s/p cryo -pap 05/08/15 neg pap will  Repeat In 5 years   Colonoscopy Dr. Allen Norris 01/2017 polyps, gastritis, diverticulosis est Dr. Allen Norris f/u 01/27/2022     Mammogram 10/13/2018 negative re ordered sch 09/01/19 left dx mammogram As of 3 or 10/28/19 utd benign cyst likely right breast rec [place] in 6 months to f/u ordered and sch 08/08/20 RECOMMENDATION: Bilateral diagnostic mammogram and RIGHT breast ultrasound in 3 months to ensure 1 year evaluation as per patient desire.   Mammogram 4//13/22 dx mammogram neg repeat in 1 year b/l diagnostic mammogram 1 year and right breast US due 11/06/21 neg ordered 2024     Rec healthy diet and exercise     Provider: Dr. Olivia Mackie [name]

## 2021-12-25 NOTE — Patient Instructions (Addendum)
Colonoscopy due 01/2022  Call and schedule eye exam  Xray ray at [facility] elbow   Leg Cramps (heat, theraworx spray, stretching)  Leg cramps occur when one or more muscles tighten and a person has no control over it (involuntary muscle contraction). Muscle cramps are most common in the calf muscles of the leg. They can occur during exercise or at rest. Leg cramps are painful, and they may last for a few seconds to a few minutes. Cramps may return several times before they finally stop. Usually, leg cramps are not caused by a serious medical problem. In many cases, the cause is not known. Some common causes include: Excessive physical effort (overexertion), such as during intense exercise. Doing the same motion over and over. Staying in a certain position for a long period of time. Improper preparation, form, or technique while doing a sport or an activity. Dehydration. Injury. Side effects of certain medicines. Abnormally low levels of minerals in your blood (electrolytes), especially potassium and calcium. This could result from: Pregnancy. Taking diuretic medicines. Follow these instructions at home: Eating and drinking Drink enough fluid to keep your urine pale yellow. Staying hydrated may help prevent cramps. Eat a healthy diet that includes plenty of nutrients to help your muscles function. A healthy diet includes fruits and vegetables, lean protein, whole grains, and low-fat or nonfat dairy products. Managing pain, stiffness, and swelling     Try massaging, stretching, and relaxing the affected muscle. Do this for several minutes at a time. If directed, put ice on areas that are sore or painful after a cramp. To do this: Put ice in a plastic bag. Place a towel between your skin and the bag. Leave the ice on for 20 minutes, 2-3 times a day. Remove the ice if your skin turns bright red. This is very important. If you cannot feel pain, heat, or cold, you have a greater risk of damage  to the area. If directed, apply heat to muscles that are tense or tight. Do this before you exercise, or as often as told by your health care provider. Use the heat source that your health care provider recommends, such as a moist heat pack or a heating pad. To do this: Place a towel between your skin and the heat source. Leave the heat on for 20-30 minutes. Remove the heat if your skin turns bright red. This is especially important if you are unable to feel pain, heat, or cold. You may have a greater risk of getting burned. Try taking hot showers or baths to help relax tight muscles. General instructions If you are having frequent leg cramps, avoid intense exercise for several days. Take over-the-counter and prescription medicines only as told by your health care provider. Keep all follow-up visits. This is important. Contact a health care provider if: Your leg cramps get more severe or more frequent, or they do not improve over time. Your foot becomes cold, numb, or blue. Summary Muscle cramps can develop in any muscle, but the most common place is in the calf muscles of the leg. Leg cramps are painful, and they may last for a few seconds to a few minutes. Usually, leg cramps are not caused by a serious medical problem. Often, the cause is not known. Stay hydrated, and take over-the-counter and prescription medicines only as told by your health care provider. This information is not intended to replace advice given to you by your health care provider. Make sure you discuss any questions you have  with your health care provider. Document Revised: 11/30/2019 Document Reviewed: 11/30/2019 Elsevier Patient Education  [employer].

## 2021-12-26 DIAGNOSIS — G4710 Hypersomnia, unspecified: Secondary | ICD-10-CM | POA: Diagnosis not present

## 2021-12-26 DIAGNOSIS — I259 Chronic ischemic heart disease, unspecified: Secondary | ICD-10-CM | POA: Diagnosis not present

## 2021-12-26 DIAGNOSIS — G4733 Obstructive sleep apnea (adult) (pediatric): Secondary | ICD-10-CM | POA: Diagnosis not present

## 2021-12-30 ENCOUNTER — Ambulatory Visit: Payer: Medicare HMO | Admitting: *Deleted

## 2021-12-30 DIAGNOSIS — I5022 Chronic systolic (congestive) heart failure: Secondary | ICD-10-CM

## 2021-12-30 DIAGNOSIS — M6281 Muscle weakness (generalized): Secondary | ICD-10-CM | POA: Diagnosis not present

## 2021-12-30 DIAGNOSIS — E1159 Type 2 diabetes mellitus with other circulatory complications: Secondary | ICD-10-CM

## 2021-12-30 DIAGNOSIS — M542 Cervicalgia: Secondary | ICD-10-CM | POA: Diagnosis not present

## 2021-12-30 DIAGNOSIS — M25511 Pain in right shoulder: Secondary | ICD-10-CM | POA: Diagnosis not present

## 2021-12-30 NOTE — Patient Instructions (Addendum)
Visit Information  Thank you for taking time to visit with me today. Please don't hesitate to contact me if I can be of assistance to you before our next scheduled telephone appointment.  Following are the goals we discussed today:  Take all medications as prescribed Attend all scheduled provider appointments Call provider office for new concerns or questions  call office if I gain more than 2 pounds in one day or 5 pounds in one week track weight in diary use salt in moderation weigh myself daily develop a rescue plan keep appointment with eye doctor check blood sugar at prescribed times: when you have symptoms of low or high blood sugar and few times a week   Our next appointment is by telephone on 7/24 at 1000  Please call the care guide team at [phone] if you need to cancel or reschedule your appointment.   If you are experiencing a Mental Health or [place] or need someone to talk to, please call the Suicide and Crisis Lifeline: 988 call the [place] National Suicide Prevention Lifeline: (818)619-4487 or TTY: 514-642-9644 TTY (520)769-7096) to talk to a trained counselor call 1-800-273-TALK (toll free, 24 hour hotline) call 911   Patient verbalizes understanding of instructions and care plan provided today and agrees to view in [place]. Active MyChart status and patient understanding of how to access instructions and care plan via MyChart confirmed with patient.     The care management team will reach out to the patient again over the next 60 days.   Hubert Azure RN, MSN RN Care Management Coordinator [place] [phone] [email or web address]

## 2021-12-30 NOTE — Chronic Care Management (AMB) (Signed)
Care Management    RN Visit Note  12/30/2021 Name: [name] MRN: [id number] DOB: [birth year]  Subjective: [name] is a 61 y.o. year old female who is a primary care patient of [name], Lorraine Glow, MD. The care management team was consulted for assistance with disease management and care coordination needs.    Engaged with patient by telephone for follow up visit in response to provider referral for case management and/or care coordination services.   Consent to Services:   [name] was given information about Care Management services today including:  Care Management services includes personalized support from designated clinical staff supervised by her physician, including individualized plan of care and coordination with other care providers 24/7 contact phone numbers for assistance for urgent and routine care needs. The patient may stop case management services at any time by phone call to the office staff.  Patient agreed to services and consent obtained.   Assessment: Review of patient past medical history, allergies, medications, health status, including review of consultants reports, laboratory and other test data, was performed as part of comprehensive evaluation and provision of chronic care management services.   SDOH (Social Determinants of Health) assessments and interventions performed:    Care Plan  Allergies  Allergen Reactions   Latex Rash        Shellfish Allergy Anaphylaxis    Hard shellfish/swelling in throat   Sulfonamide Derivatives Anaphylaxis    Swelling in throat.   Watermelon [Citrullus Vulgaris] Other (See Comments)    Throat itchy   Penicillins Itching   Soy Allergy     Diarrhea/upset stomach     Tomato Itching    Throat itchy - also Raw carrots   Other Swelling    Outpatient Encounter Medications as of 12/30/2021  Medication Sig Note   albuterol (VENTOLIN HFA) 108 (90 Base) MCG/ACT inhaler Inhale 2 puffs into the lungs  every 6 (six) hours as needed for wheezing or shortness of breath.    aspirin EC 81 MG tablet Take 1 tablet (81 mg total) by mouth daily.    Azelastine HCl 0.15 % SOLN Place 2 sprays into both nostrils daily as needed (allergies).    benzonatate (TESSALON) 100 MG capsule Take 1 capsule (100 mg total) by mouth 3 (three) times daily as needed for cough.    clobetasol cream (TEMOVATE) 0.26 % Apply 1 application. topically 2 (two) times daily. Prn left elbow    Cyanocobalamin (VITAMIN B-12) 1000 MCG SUBL Place 0.001 tablets (1 mcg total) under the tongue daily.    cyclobenzaprine (FLEXERIL) 5 MG tablet Take 1-2 tablets (5-10 mg total) by mouth daily as needed for muscle spasms.    dapagliflozin propanediol (FARXIGA) 10 MG TABS tablet Take 1 tablet (10 mg total) by mouth daily before breakfast.    dicyclomine (BENTYL) 10 MG capsule Take 1 capsule (10 mg total) by mouth 3 (three) times daily before meals. As needed for abdominal pain    esomeprazole (NEXIUM) 40 MG capsule Take 1 capsule (40 mg total) by mouth daily.    Evolocumab (REPATHA SURECLICK) 378 MG/ML SOAJ INJECT THE CONTENTS OF 1 PEN INTO THE SKIN ONCE EVERY 14 DAYS    ezetimibe (ZETIA) 10 MG tablet Take 1 tablet (10 mg total) by mouth daily.    fluticasone (FLONASE) 50 MCG/ACT nasal spray Place 2 sprays into both nostrils daily as needed for allergies. Max dose 2 sprays each nostril    Fluticasone-Umeclidin-Vilant (TRELEGY ELLIPTA) 100-62.5-25 MCG/ACT AEPB Inhale 1 puff into  the lungs daily. Rinse mouth    furosemide (LASIX) 20 MG tablet Qd and 2nd tablet prn for edema    hydrOXYzine (ATARAX) 25 MG tablet TAKE 1 TO 2 TABLETS BY MOUTH ONCE DAILY AS NEEDED    ipratropium (ATROVENT) 0.06 % nasal spray Place 2 sprays into both nostrils 4 (four) times daily.    ipratropium-albuterol (DUONEB) 0.5-2.5 (3) MG/3ML SOLN Take 3 mLs by nebulization 2 (two) times daily as needed (wheezing/shortness of breath).    isosorbide mononitrate (IMDUR) 30 MG 24 hr  tablet Take 1 tablet by mouth twice daily 11/22/2021: REPORTS TAKING ONCE A DAY   levocetirizine (XYZAL) 5 MG tablet Take 1 tablet (5 mg total) by mouth at bedtime as needed for allergies.    linaclotide (LINZESS) 72 MCG capsule TAKE 1 CAPSULE BY MOUTH ONCE DAILY BEFORE BREAKFAST    losartan (COZAAR) 25 MG tablet Take 0.5 tablets (12.5 mg total) by mouth daily.    meclizine (ANTIVERT) 25 MG tablet TAKE 1 TABLET BY MOUTH TWICE DAILY AS NEEDED FOR DIZZINESS    metoprolol succinate (TOPROL-XL) 50 MG 24 hr tablet TAKE 1 TABLET BY MOUTH ONCE DAILY WITH OR IMMEDIATELY FOLLOWING A MEAL    montelukast (SINGULAIR) 10 MG tablet Take 1 tablet (10 mg total) by mouth at bedtime.    nitroGLYCERIN (NITROSTAT) 0.4 MG SL tablet DISSOLVE ONE TABLET UNDER THE TONGUE EVERY 5 MINUTES AS NEEDED FOR CHEST PAIN.  DO NOT EXCEED A TOTAL OF 3 DOSES IN 15 MINUTES    nystatin (MYCOSTATIN) 100000 UNIT/ML suspension Take 5 mLs (500,000 Units total) by mouth 4 (four) times daily. (Patient taking differently: Take 5 mLs by mouth as needed.)    olopatadine (PATANOL) 0.1 % ophthalmic solution Place 1 drop into both eyes 2 (two) times daily.    ondansetron (ZOFRAN) 4 MG tablet Take 1 tablet (4 mg total) by mouth every 8 (eight) hours as needed for nausea or vomiting.    OneTouch Delica Lancets 82N MISC USE TO CHECK GLUCOSE IN THE MORNING AND AT BEDTIME    ONETOUCH VERIO test strip USE 1 STRIP TO CHECK GLUCOSE TWICE DAILY ONCE  IN  THE  MORNING  AND  ONCE  AT  BEDTIME    PARoxetine (PAXIL) 20 MG tablet Take 1 tablet (20 mg total) by mouth daily.    potassium chloride (KLOR-CON M) 10 MEQ tablet Take 1 tablet (10 mEq total) by mouth daily.    pregabalin (LYRICA) 75 MG capsule Take 1 capsule (75 mg total) by mouth 2 (two) times daily.    rivaroxaban (XARELTO) 2.5 MG TABS tablet Take 1 tablet by mouth twice daily    rosuvastatin (CRESTOR) 20 MG tablet Take 1 tablet (20 mg total) by mouth at bedtime.    sodium chloride (OCEAN) 0.65 % SOLN  nasal spray Place 2 sprays into both nostrils daily as needed for congestion.    traZODone (DESYREL) 50 MG tablet Take 0.5-1 tablets (25-50 mg total) by mouth at bedtime as needed for sleep.    Vibegron (GEMTESA) 75 MG TABS Take 75 mg by mouth daily. D/c myrbetriq    No facility-administered encounter medications on file as of 12/30/2021.    Patient Active Problem List   Diagnosis Date Noted   PND (post-nasal drip) 07/24/2021   Chronic pain of inguinal region 07/24/2021   Thrush 04/19/2021   Skin lesion 04/19/2021   Vaginal itching 04/19/2021   Vitamin D deficiency 01/14/2021   Breast mass, right 01/14/2021   Annual physical exam  01/14/2021   Pruritus of both eyes 09/21/2020   Lipoma of right lower extremity 08/30/2020   Hypertension associated with diabetes ([place]) 07/24/2020   Abnormality of both breasts on screening mammogram 05/14/2020   Bilateral hip pain 04/19/2020   Insomnia 04/19/2020   Chronic low back pain 02/22/2020   Internal hemorrhoids 02/22/2020   Benign breast cyst in female, right 02/22/2020   OSA on CPAP 12/15/2019   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[place]) 12/02/2019   Sensorimotor neuropathy 12/02/2019   Irritable bowel syndrome with both constipation and diarrhea 10/13/2019   Mild intermittent asthma 10/13/2019   PUD (peptic ulcer disease)    Status post total knee replacement using cement, right 04/14/2019   Overactive bladder 03/10/2019   Mild persistent asthma 11/29/2018   DDD (degenerative disc disease), cervical 11/26/2018   Chronic constipation 11/26/2018   Fatty liver 11/26/2018   Adrenal adenoma, left 11/26/2018   Allergic rhinitis 08/26/2018   Bunion of right foot 08/26/2018   Fall 05/14/2018   Anxiety and depression 04/05/2018   Cervical radiculopathy 02/24/2018   Numbness and tingling in right hand 02/24/2018   Chronic neck pain 12/18/2017   Type 2 diabetes mellitus without complication, without long-term current use of insulin ([place]) 12/18/2017   Urinary  incontinence 12/18/2017   Lumbar radiculopathy 12/18/2017   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   Abnormal feces    Benign neoplasm of ascending colon    Intractable vomiting with nausea    Acute esophagogastric ulcer    Gastritis without bleeding    Vasomotor flushing 11/24/2016   Morbid obesity ([place]) 10/29/2015   Back ache 06/19/2015   Colon polyp 06/19/2015   CCF (congestive cardiac failure) ([place]) 06/19/2015   Family history of colon cancer 06/19/2015   Orthostatic hypotension 04/30/2015   Hematochezia 01/11/2015   History of colonic polyps 01/11/2015   Atypical chest pain 01/20/2014   COPD with chronic bronchitis and emphysema ([place]) 01/03/2013   Right knee pain 12/20/2011   HTN (hypertension) 37/90/2409   Systolic CHF, chronic (HCC)    History of cervical cancer    Depression    GERD (gastroesophageal reflux disease)    Seasonal allergies    History of drug abuse ([place])    PVD (peripheral vascular disease) ([place]) 02/03/2011   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   Smoking history 12/16/2010   Hyperlipidemia 08/23/2010   Coronary artery disease involving native coronary artery of native heart without angina pectoris 08/23/2010   CLAUDICATION 08/23/2010   CAROTID BRUIT 08/23/2010   SHORTNESS OF BREATH 08/23/2010    Conditions to be addressed/monitored: CHF and DMII  Care Plan : [facility] (Adult)  Updates made by Leona Singleton, RN since 12/30/2021 12:00 AM     Problem: KNOWLEDGE DEFICIET RELATED TO SELF CARE MANAGEMENT OF CHRONIC MEICAL CONDITIONS   Priority: Medium     Long-Range Goal: PATIENT WILL WORK WITH CCM TEAM TO GAIN KNOWLEDGE TO BETTER MANAGE CHRONIC CONDITIONS   Start Date: 11/22/2021  Expected End Date: 11/22/2022  Priority: Medium  Note:   Current Barriers:  Knowledge Deficits related to plan of care for management of CHF and DMII  4/28--acknowledges history of diabetes, heat failure, hypertension, and COPD.  Has not been weighing, checking  blood sugars, nor blood pressures.  States she will start.  Denies any chest pain, SOB, dizziness, or swelling.  No major questions or concerns 6/5--Reports doing well.  Continues to only monitor blood sugars monthly, and A1C remains below goal.  States she does  weigh daily, has not weighed this morning, last weight was 216 pounds.  Denies SOB, chest pains, swelling or dizziness.  Denies any questions or concerns.  RNCM Clinical Goal(s):  Patient will demonstrate Improved health management independence as evidenced by WEIGHING DAILY, CHECKING BLOOD SUGARS EVERY OTHER DAY  through collaboration with RN Care manager, provider, and care team.   Interventions: 1:1 collaboration with primary care provider regarding development and update of comprehensive plan of care as evidenced by provider attestation and co-signature Inter-disciplinary care team collaboration (see longitudinal plan of care) Evaluation of current treatment plan related to  self management and patient's adherence to plan as established by provider   Heart Failure Interventions:  (Status:  Goal on track:  Yes.) Long Term Goal Basic overview and discussion of pathophysiology of Heart Failure reviewed Provided education on low sodium diet Provided education about placing scale on hard, flat surface Advised patient to weigh each morning after emptying bladder Discussed importance of daily weight and advised patient to weigh and record daily Reviewed role of diuretics in prevention of fluid overload and management of heart failure; Discussed the importance of keeping all appointments with provider Reviewed S/S of heart failure and when to call provider  Diabetes Interventions:  (Status:  Goal on track:  Yes.) Long Term Goal Assessed patient's understanding of A1c goal: <7% Provided education to patient about basic DM disease process Reviewed medications with patient and discussed importance of medication adherence Counseled on  importance of regular laboratory monitoring as prescribed Discussed plans with patient for ongoing care management follow up and provided patient with direct contact information for care management team Provided patient with written educational materials related to hypo and hyperglycemia and importance of correct treatment Advised patient, providing education and rationale, to check cbg few times week/every other day and record, calling PCP for findings outside established parameters Congratulated on current A1C reduction Discussed and encouraged monitoring blood sugars at least once weekly Lab Results  Component Value Date   HGBA1C 6.3 12/06/2021  Patient Goals/Self-Care Activities: Take all medications as prescribed Attend all scheduled provider appointments Call provider office for new concerns or questions  call office if I gain more than 2 pounds in one day or 5 pounds in one week track weight in diary use salt in moderation weigh myself daily develop a rescue plan keep appointment with eye doctor check blood sugar at prescribed times: when you have symptoms of low or high blood sugar and few times a week  Follow Up Plan:  The care management team will reach out to the patient again over the next 60 days.      Plan: The care management team will reach out to the patient again over the next 60 days.  Hubert Azure RN, MSN RN Care Management Coordinator [place] [phone] [email or web address]

## 2022-01-01 ENCOUNTER — Encounter: Payer: Self-pay | Admitting: Podiatry

## 2022-01-01 ENCOUNTER — Ambulatory Visit (INDEPENDENT_AMBULATORY_CARE_PROVIDER_SITE_OTHER): Payer: Medicare HMO | Admitting: Podiatry

## 2022-01-01 ENCOUNTER — Ambulatory Visit (INDEPENDENT_AMBULATORY_CARE_PROVIDER_SITE_OTHER): Payer: Medicare HMO

## 2022-01-01 DIAGNOSIS — M109 Gout, unspecified: Secondary | ICD-10-CM

## 2022-01-01 MED ORDER — METHYLPREDNISOLONE 4 MG PO TBPK: ORAL_TABLET | ORAL | 0 refills | Status: DC

## 2022-01-01 NOTE — Progress Notes (Signed)
She presents today states that she woke up yesterday morning with a red hot swollen first metatarsophalangeal joint of the right foot that she can hardly put to the floor.  She states that she could not stand anything to touch it but is starting to feel much better today.  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  There is no cellulitic process.  Radiographs taken today demonstrate osteoarthritis and soft tissue swelling around the first metatarsophalangeal joint with hallux abductovalgus deformity.  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  Plan: I offered her an injection she declined.  Started her on methylprednisolone.  Should this recur she is to notify us immediately for blood work.  I would like to follow-up with her in about a month to make sure she is doing better.

## 2022-01-09 DIAGNOSIS — I779 Disorder of arteries and arterioles, unspecified: Secondary | ICD-10-CM | POA: Diagnosis not present

## 2022-01-09 DIAGNOSIS — R8290 Unspecified abnormal findings in urine: Secondary | ICD-10-CM | POA: Diagnosis not present

## 2022-01-09 DIAGNOSIS — I10 Essential (primary) hypertension: Secondary | ICD-10-CM | POA: Diagnosis not present

## 2022-01-09 DIAGNOSIS — J449 Chronic obstructive pulmonary disease, unspecified: Secondary | ICD-10-CM | POA: Diagnosis not present

## 2022-01-09 DIAGNOSIS — E1122 Type 2 diabetes mellitus with diabetic chronic kidney disease: Secondary | ICD-10-CM | POA: Diagnosis not present

## 2022-01-09 DIAGNOSIS — N1831 Chronic kidney disease, stage 3a: Secondary | ICD-10-CM | POA: Diagnosis not present

## 2022-01-09 DIAGNOSIS — E785 Hyperlipidemia, unspecified: Secondary | ICD-10-CM | POA: Diagnosis not present

## 2022-01-09 DIAGNOSIS — I2510 Atherosclerotic heart disease of native coronary artery without angina pectoris: Secondary | ICD-10-CM | POA: Diagnosis not present

## 2022-01-09 DIAGNOSIS — R609 Edema, unspecified: Secondary | ICD-10-CM | POA: Diagnosis not present

## 2022-01-09 DIAGNOSIS — I509 Heart failure, unspecified: Secondary | ICD-10-CM | POA: Diagnosis not present

## 2022-01-10 ENCOUNTER — Other Ambulatory Visit: Payer: Self-pay | Admitting: Cardiovascular Disease

## 2022-01-13 DIAGNOSIS — M542 Cervicalgia: Secondary | ICD-10-CM | POA: Diagnosis not present

## 2022-01-13 DIAGNOSIS — M25511 Pain in right shoulder: Secondary | ICD-10-CM | POA: Diagnosis not present

## 2022-01-13 DIAGNOSIS — M6281 Muscle weakness (generalized): Secondary | ICD-10-CM | POA: Diagnosis not present

## 2022-01-16 ENCOUNTER — Ambulatory Visit (INDEPENDENT_AMBULATORY_CARE_PROVIDER_SITE_OTHER): Payer: Medicare HMO | Admitting: Pulmonary Disease

## 2022-01-16 ENCOUNTER — Other Ambulatory Visit
Admission: RE | Admit: 2022-01-16 | Discharge: 2022-01-16 | Disposition: A | Discharge status: Home / Self Care | Payer: Medicare HMO | Source: Ambulatory Visit | Attending: Pulmonary Disease | Admitting: Pulmonary Disease

## 2022-01-16 ENCOUNTER — Encounter: Payer: Self-pay | Admitting: Pulmonary Disease

## 2022-01-16 VITALS — BP 120/70 | HR 70 | Temp 98.1°F | Ht 65.0 in | Wt 212.0 lb

## 2022-01-16 DIAGNOSIS — J4551 Severe persistent asthma with (acute) exacerbation: Secondary | ICD-10-CM

## 2022-01-16 LAB — CBC WITH DIFFERENTIAL/PLATELET
Abs Immature Granulocytes: 0.02 10*3/uL (ref 0.00–0.07)
Basophils Absolute: 0 10*3/uL (ref 0.0–0.1)
Basophils Relative: 1 %
Eosinophils Absolute: 0.2 10*3/uL (ref 0.0–0.5)
Eosinophils Relative: 2 %
HCT: 46.4 % — ABNORMAL HIGH (ref 36.0–46.0)
Hemoglobin: 15 g/dL (ref 12.0–15.0)
Immature Granulocytes: 0 %
Lymphocytes Relative: 49 %
Lymphs Abs: 3.2 10*3/uL (ref 0.7–4.0)
MCH: 31.1 pg (ref 26.0–34.0)
MCHC: 32.3 g/dL (ref 30.0–36.0)
MCV: 96.3 fL (ref 80.0–100.0)
Monocytes Absolute: 0.5 10*3/uL (ref 0.1–1.0)
Monocytes Relative: 7 %
Neutro Abs: 2.7 10*3/uL (ref 1.7–7.7)
Neutrophils Relative %: 41 %
Platelets: 160 10*3/uL (ref 150–400)
RBC: 4.82 MIL/uL (ref 3.87–5.11)
RDW: 13.6 % (ref 11.5–15.5)
WBC: 6.5 10*3/uL (ref 4.0–10.5)
nRBC: 0 % (ref 0.0–0.2)

## 2022-01-16 MED ORDER — AZITHROMYCIN 250 MG PO TABS: ORAL_TABLET | ORAL | 0 refills | Status: AC

## 2022-01-16 NOTE — Patient Instructions (Signed)
Lab tests today  Zithromax antibiotic 250 mg pill >> 2 pills on day 1, then 1 pill daily for next 4 days  Follow up in 4 weeks with Dr. Halford Chessman or Nurse practitioner

## 2022-01-16 NOTE — Progress Notes (Signed)
f °

## 2022-01-19 LAB — ALLERGENS W/TOTAL IGE AREA 2
Alternaria Alternata IgE: 0.42 kU/L — AB
Aspergillus Fumigatus IgE: <0.1 kU/L
Bermuda Grass IgE: 23 kU/L — AB
Cat Dander IgE: 0.22 kU/L — AB
Cedar, Mountain IgE: 7.37 kU/L — AB
Cladosporium Herbarum IgE: 0.1 kU/L — AB
Cockroach, German IgE: 0.31 kU/L — AB
Common Silver Birch IgE: 50.1 kU/L — AB
Cottonwood IgE: 18.7 kU/L — AB
D Farinae IgE: 0.15 kU/L — AB
D Pteronyssinus IgE: <0.1 kU/L
Dog Dander IgE: 0.45 kU/L — AB
Elm, American IgE: 31.1 kU/L — AB
IgE (Immunoglobulin E), Serum: 536 IU/mL — ABNORMAL HIGH (ref 6–495)
Johnson Grass IgE: 7.15 kU/L — AB
Maple/Box Elder IgE: 28.4 kU/L — AB
Mouse Urine IgE: <0.1 kU/L
Oak, White IgE: 45 kU/L — AB
Pecan, Hickory IgE: 33.9 kU/L — AB
Penicillium Chrysogen IgE: <0.1 kU/L
Pigweed, Rough IgE: 28.3 kU/L — AB
Ragweed, Short IgE: 44.8 kU/L — AB
Sheep Sorrel IgE Qn: 35.5 kU/L — AB
Timothy Grass IgE: 7.33 kU/L — AB
White Mulberry IgE: 0.44 kU/L — AB

## 2022-01-22 DIAGNOSIS — G4710 Hypersomnia, unspecified: Secondary | ICD-10-CM | POA: Diagnosis not present

## 2022-01-22 DIAGNOSIS — I259 Chronic ischemic heart disease, unspecified: Secondary | ICD-10-CM | POA: Diagnosis not present

## 2022-01-22 DIAGNOSIS — M25511 Pain in right shoulder: Secondary | ICD-10-CM | POA: Diagnosis not present

## 2022-01-22 DIAGNOSIS — M6281 Muscle weakness (generalized): Secondary | ICD-10-CM | POA: Diagnosis not present

## 2022-01-22 DIAGNOSIS — M542 Cervicalgia: Secondary | ICD-10-CM | POA: Diagnosis not present

## 2022-01-22 DIAGNOSIS — G4733 Obstructive sleep apnea (adult) (pediatric): Secondary | ICD-10-CM | POA: Diagnosis not present

## 2022-01-23 ENCOUNTER — Ambulatory Visit: Payer: Medicare HMO | Attending: Internal Medicine

## 2022-01-23 DIAGNOSIS — M6289 Other specified disorders of muscle: Secondary | ICD-10-CM | POA: Insufficient documentation

## 2022-01-23 DIAGNOSIS — I2510 Atherosclerotic heart disease of native coronary artery without angina pectoris: Secondary | ICD-10-CM | POA: Diagnosis not present

## 2022-01-23 DIAGNOSIS — E119 Type 2 diabetes mellitus without complications: Secondary | ICD-10-CM | POA: Diagnosis not present

## 2022-01-23 DIAGNOSIS — R278 Other lack of coordination: Secondary | ICD-10-CM | POA: Insufficient documentation

## 2022-01-23 DIAGNOSIS — N329 Bladder disorder, unspecified: Secondary | ICD-10-CM | POA: Diagnosis not present

## 2022-01-23 DIAGNOSIS — N819 Female genital prolapse, unspecified: Secondary | ICD-10-CM | POA: Diagnosis not present

## 2022-01-23 DIAGNOSIS — N1831 Chronic kidney disease, stage 3a: Secondary | ICD-10-CM | POA: Diagnosis not present

## 2022-01-23 DIAGNOSIS — N3281 Overactive bladder: Secondary | ICD-10-CM | POA: Insufficient documentation

## 2022-01-23 DIAGNOSIS — M6281 Muscle weakness (generalized): Secondary | ICD-10-CM | POA: Insufficient documentation

## 2022-01-23 DIAGNOSIS — R609 Edema, unspecified: Secondary | ICD-10-CM | POA: Diagnosis not present

## 2022-01-23 DIAGNOSIS — I779 Disorder of arteries and arterioles, unspecified: Secondary | ICD-10-CM | POA: Diagnosis not present

## 2022-01-23 DIAGNOSIS — R69 Illness, unspecified: Secondary | ICD-10-CM | POA: Diagnosis not present

## 2022-01-23 DIAGNOSIS — E785 Hyperlipidemia, unspecified: Secondary | ICD-10-CM | POA: Diagnosis not present

## 2022-01-23 DIAGNOSIS — J45909 Unspecified asthma, uncomplicated: Secondary | ICD-10-CM | POA: Diagnosis not present

## 2022-01-23 DIAGNOSIS — I10 Essential (primary) hypertension: Secondary | ICD-10-CM | POA: Diagnosis not present

## 2022-01-23 DIAGNOSIS — K219 Gastro-esophageal reflux disease without esophagitis: Secondary | ICD-10-CM | POA: Diagnosis not present

## 2022-01-23 NOTE — Therapy (Signed)
OUTPATIENT PHYSICAL THERAPY FEMALE PELVIC EVALUATION   Patient Name: [name] MRN: [id number] DOB:1961/04/27, 61 y.o., female Today's Date: 01/23/2022   PT End of Session - 01/23/22 0927     Visit Number 1    Number of Visits 12    Date for PT Re-Evaluation 04/17/22    Authorization Type IE: 01/23/22    PT Start Time 0930    PT Stop Time 1012    PT Time Calculation (min) 42 min    Activity Tolerance Patient tolerated treatment well             Past Medical History:  Diagnosis Date   Allergy    Anemia    Arthritis    Asthma    Bacterial vaginitis    Blood in stool    Brachial neuritis or radiculitis NOS    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 being followed by neurosurgery/eye doctor and pcp. monitoring size.causes sinus problems   Bronchitis    Cardiac arrhythmia    Cervical neck pain with evidence of disc disease    C5/6 disease, MRI done late 2012 - no records available   Chronic combined systolic and diastolic CHF (congestive heart failure) ([place])    a. 08/2010 Echo: mildly reduced EF 40-45%, mild diffuse hypokinesis; b. 06/2016 Echo: EF 50%, no rwma, mild to mod TR; c. 03/2017 Echo: EF 40-45%, Gr1 DD (prior echo reviewed and EF felt to be lower than reported).   Chronic pain    Cocaine abuse, in remission (HCC)    clean x 24 years   Colon polyps    COPD (chronic obstructive pulmonary disease) ([place])    a. 12/2018 PFT: No obvious obst/restrictive dzs.   Coronary artery disease    a. PCI of LCX 2003; b. PCI of the LAD 2012 with a (2.5 x 8 mm BMS);  c.s/p CABG 4/12: L-LAD, VG-Dx, VG-OM, VG-RCA (Dr. Prescott Gum);  d. 01/2012 MV: inf infarct, attenuation, no ischemia; e. 02/2017 MV: signifi attenuation artifact. Fixed basal antlat/inflat scar vs artifact. Reversible apical lat and mid antlat defect - ? atten vs ischemia. F/u echo w/o wma->Med rx.   Depression    Diabetes mellitus without complication ([place])    Family history of colon cancer     Generalized headaches    frequent   GERD (gastroesophageal reflux disease)    Headache    Heart murmur    Hematochezia    Hepatitis    history of hepatitis b   History of cervical cancer    s/p cryotherapy   History of drug abuse ([place])    cocaine, marijuana, clean since 1989   History of hepatitis B    from eating undercooked liver   History of MI (myocardial infarction)    Hyperlipidemia    Hypertension    Ischemic cardiomyopathy    a. 03/2017 Echo: EF 40-45%, Gr1 DD.   Myocardial infarction ([place]) 2003, 2012   PAD (peripheral artery disease) ([place])    a. s/p Right SFA atherectomy and PTA 01/15/11; b. 07/2018 ABI: R 1.02, L 1.09.   Pituitary mass ([place])    a. 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 Mass abuts R cavernous sinus w/o definite invasion.   Polyp of colon    Seasonal allergies    Sleep apnea    uses cpap   Smoking history    quit 07/2010   Thyroid disease    Urinary incontinence    Past  Surgical History:  Procedure Laterality Date   ABDOMINAL HYSTERECTOMY     2003   CHOLECYSTECTOMY  1986   COLONOSCOPY  2008   3 polyps   COLONOSCOPY WITH PROPOFOL N/A 02/06/2015   Procedure: COLONOSCOPY WITH PROPOFOL;  Surgeon: Lucilla Lame, MD;  Location: [facility] ENDOSCOPY;  Service: Endoscopy;  Laterality: N/A;   COLONOSCOPY WITH PROPOFOL N/A 01/27/2017   Procedure: COLONOSCOPY WITH PROPOFOL;  Surgeon: Lucilla Lame, MD;  Location[facility] ENDOSCOPY;  Service: Endoscopy;  Laterality: N/A;   CORONARY ANGIOPLASTY     w/ stent placement x2   CORONARY ARTERY BYPASS GRAFT  2012   Dr [name]   CORONARY STENT PLACEMENT  2003   S/P MI   CORONARY STENT PLACEMENT  2007   [place]   ESOPHAGOGASTRODUODENOSCOPY (EGD) WITH PROPOFOL N/A 02/06/2015   Procedure: ESOPHAGOGASTRODUODENOSCOPY (EGD) WITH PROPOFOL;  Surgeon: Lucilla Lame, MD;  Location: [facility] ENDOSCOPY;  Service: Endoscopy;  Laterality: N/A;   ESOPHAGOGASTRODUODENOSCOPY (EGD) WITH PROPOFOL N/A 01/27/2017   Procedure:  ESOPHAGOGASTRODUODENOSCOPY (EGD) WITH PROPOFOL;  Surgeon: Lucilla Lame, MD;  Location: [facility] ENDOSCOPY;  Service: Endoscopy;  Laterality: N/A;   ESOPHAGOGASTRODUODENOSCOPY (EGD) WITH PROPOFOL N/A 09/01/2019   Procedure: ESOPHAGOGASTRODUODENOSCOPY (EGD) WITH PROPOFOL;  Surgeon: Lin Landsman, MD;  Location: [place];  Service: Gastroenterology;  Laterality: N/A;   FEMORAL ARTERY STENT  10/2010   right sided (Dr. Burt Knack)   KNEE ARTHROSCOPY WITH MEDIAL MENISECTOMY Right 08/20/2017   Procedure: KNEE ARTHROSCOPY WITH MEDIAL  AND LATERAL MENISECTOMY;  Surgeon: Hessie Knows, MD;  Location: [facility] ORS;  Service: Orthopedics;  Laterality: Right;   POSTERIOR CERVICAL LAMINECTOMY N/A 03/08/2018   Procedure: POSTERIOR CERVICAL LAMINECTOMY-C7;  Surgeon: Deetta Perla, MD;  Location: [facility] ORS;  Service: Neurosurgery;  Laterality: N/A;   TONSILLECTOMY     TOTAL KNEE ARTHROPLASTY Right 04/14/2019   Procedure: RIGHT TOTAL KNEE ARTHROPLASTY;  Surgeon: Hessie Knows, MD;  Location: [facility] ORS;  Service: Orthopedics;  Laterality: Right;   TUBAL LIGATION     Patient Active Problem List   Diagnosis Date Noted   PND (post-nasal drip) 07/24/2021   Chronic pain of inguinal region 07/24/2021   Thrush 04/19/2021   Skin lesion 04/19/2021   Vaginal itching 04/19/2021   Vitamin D deficiency 01/14/2021   Breast mass, right 01/14/2021   Annual physical exam 01/14/2021   Pruritus of both eyes 09/21/2020   Lipoma of right lower extremity 08/30/2020   Hypertension associated with diabetes ([place]) 07/24/2020   Abnormality of both breasts on screening mammogram 05/14/2020   Bilateral hip pain 04/19/2020   Insomnia 04/19/2020   Chronic low back pain 02/22/2020   Internal hemorrhoids 02/22/2020   Benign breast cyst in female, right 02/22/2020   OSA on CPAP 12/15/2019   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[place]) 12/02/2019   Sensorimotor neuropathy 12/02/2019   Irritable bowel syndrome with both constipation and diarrhea 10/13/2019   Mild  intermittent asthma 10/13/2019   PUD (peptic ulcer disease)    Status post total knee replacement using cement, right 04/14/2019   Overactive bladder 03/10/2019   Mild persistent asthma 11/29/2018   DDD (degenerative disc disease), cervical 11/26/2018   Chronic constipation 11/26/2018   Fatty liver 11/26/2018   Adrenal adenoma, left 11/26/2018   Allergic rhinitis 08/26/2018   Bunion of right foot 08/26/2018   Fall 05/14/2018   Anxiety and depression 04/05/2018   Cervical radiculopathy 02/24/2018   Numbness and tingling in right hand 02/24/2018   Chronic neck pain 12/18/2017   Type 2 diabetes mellitus without complication, without long-term current  use of insulin ([place]) 12/18/2017   Urinary incontinence 12/18/2017   Lumbar radiculopathy 12/18/2017   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   Abnormal feces    Benign neoplasm of ascending colon    Intractable vomiting with nausea    Acute esophagogastric ulcer    Gastritis without bleeding    Vasomotor flushing 11/24/2016   Morbid obesity ([place]) 10/29/2015   Back ache 06/19/2015   Colon polyp 06/19/2015   CCF (congestive cardiac failure) ([place]) 06/19/2015   Family history of colon cancer 06/19/2015   Orthostatic hypotension 04/30/2015   Hematochezia 01/11/2015   History of colonic polyps 01/11/2015   Atypical chest pain 01/20/2014   COPD with chronic bronchitis and emphysema ([place]) 01/03/2013   Right knee pain 12/20/2011   HTN (hypertension) 46/27/0350   Systolic CHF, chronic (HCC)    History of cervical cancer    Depression    GERD (gastroesophageal reflux disease)    Seasonal allergies    History of drug abuse ([place])    PVD (peripheral vascular disease) ([place]) 02/03/2011   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   Smoking history 12/16/2010   Hyperlipidemia 08/23/2010   Coronary artery disease involving native coronary artery of native heart without angina pectoris 08/23/2010   CLAUDICATION 08/23/2010   CAROTID BRUIT 08/23/2010    SHORTNESS OF BREATH 08/23/2010    PCP: [name], [name], MD   REFERRING PROVIDER: [name], [name], MD   REFERRING DIAG:  N32.81,N81.9 (ICD-10-CM) - Overactive bladder due to prolapse of female genital organ   THERAPY DIAG:  Other lack of coordination  Pelvic floor dysfunction  Muscle weakness (generalized)  Rationale for Evaluation and Treatment: Rehabilitation  ONSET DATE: A few years now  RED FLAGS: N/A Have you had any night sweats? Unexplained weight loss? Saddle anesthesia? Unexplained changes in bowel or bladder habits?   SUBJECTIVE: Patient confirms identification and approves PT to assess pelvic floor and treatment Yes                                                                                                                                                                                           PRECAUTIONS: None  WEIGHT BEARING RESTRICTIONS: No  FALLS:  Has patient fallen in last 6 months? No  OCCUPATION/SOCIAL ACTIVITIES: Read, church, shopping, walking   PLOF: Independent  PERTINENT HISTORY/CHART REVIEW: CKD, CHF  Orland Mustard, MD (12/06/21) Plan Overactive bladder due to prolapse of female genital organ - Plan: Ambulatory referral to Physical Therapy, Vibegron (GEMTESA) 75 MG TABS, consider gyn or urogyn in the future if does not improve with PT   CHIEF CONCERN: Pt reports her kidneys are "so  bad" and the urinary leakage has been happening for some years but this year has been the worst of it. Pt tries to perform kegels in order to hold her urine when she has the very strong urge to go to the bathroom (often). Pt is unable to make it to the bathroom without having to crunch over (forward trunk flexion) and squeeze her legs tight in order to not have leakage. However, most of the time the urinary leakage happens. Pt has to plan around where to go in the community and know where the bathroom is at all times. Pt has tried briefs  and feels that her urine overflows and she has to constantly change her clothes. Pt has to change her clothes about 4-5x a day because she does not wear a brief. Pt also reports bedwetting occasionally because she cannot make it to the bathroom on time. Most recently, Pt has had bowel leakage with the urinary leakage as well. In some instances, she doesn't even notice the bowel leakage and notices when she wipes as her skin is very irritated and red. In regards to the prolapse, when Pt wipes every time she does feel "something" there but Dr did not tell her about prolapse. Pt had a bedside commode and used for awhile but is currently not using. Pt is going to her kidney doctor today and stopped taking the OAB medicine given because doctors are trying to figure out if the OAB medication is interfering with her kidneys/blood platelets.    PAIN:  Are you having pain? Yes NPRS scale: 8/10 (worst), 0/10 (best) Pain location: lower abdomen, occasionally towards the vagina   Pain description: intermittent pressure felt  Relieving factors: bending over, rest    LIVING ENVIRONMENT: Lives with: lives with their family Lives in: House/apartment   PATIENT GOALS: Pt be able to have control of her bladder, less leakage, not feeling a pressure at the vaginal opening. Pt would like to participate in sexual activity but fears about incontinence.     UROLOGICAL HISTORY Fluid intake: Yes: Water (tries a gallon a day per Dr.), fruit juice (pineapple, mango) , very occasionally a sprite Pain with urination: No Fully empty bladder: Yes Stream: Strong every time  Urgency: Yes:   Nocturia: 3-4x/night Frequency: "over 30x a day"  Leakage: Urge to void, Walking to the bathroom, Laughing, and Bending forward Pads: No because it hasn't helped and they cannot hold all the urine that comes out    GASTROINTESTINAL HISTORY  Pt has hx of constipation and is on medication (Linzess) Pain with bowel movement:  No Type of bowel movement ([place] Stool Chart): Type 5 and 7  Fully empty rectum: Yes:   Leakage: Yes: occasionally, but just recently, does not know if that is related to the medication because it is watery , Type 7 on underwear when she notices but not everyday (2x/week) , having to wear 2 underwears in case leakage happens especially when out in the community  Pads: No   SEXUAL HISTORY/FUNCTION Pain with intercourse: No Finger stimulation: no pain, but has leakage and cannot continue  Not engaging in intercourse currently due to incontinence   OBSTETRICAL HISTORY Vaginal deliveries: G4P2 Tearing: Yes: with first child    GYNECOLOGICAL HISTORY Hysterectomy: yes, partial hysterectomy  Pelvic Organ Prolapse: see above in pertinent hx, not graded  Heaviness/pressure: sometimes    OBJECTIVE:    COGNITION: Overall cognitive status: Within functional limits for tasks assessed     POSTURE: Deferred 2/2 time  constraints  Lumbar lordosis:   Thoracic kyphosis: Iliac crest height:  Lumbar lateral shift:  Pelvic obliquity:  Leg length discrepancy:   GAIT: Deferred 2/2 time constraints  Distance walked:  Comments:   Trendelenburg:   SENSATION: Deferred 2/2 time constraints  Light touch: , L2-S2 dermatomes  Proprioception:   LUMBAR AROM/PROM: Deferred 2/2 time constraints   AROM (Normal range in degrees) AROM  eval  Flexion (65)   Extension (25-30)   Right lateral flexion (25)   Left lateral flexion (25)   Right rotation (30)   Left rotation (30)    (*= pain, Blank rows = not tested)  LOWER EXTREMITY ROM: Deferred 2/2 time constraints    ROM (Normal range in degrees) Right eval Left eval  Hip flexion (0-125)    Hip extension (0-15)    Hip abduction (0-40)    Hip adduction    Hip internal rotation (0-45)    Hip external rotation (0-45)    Knee flexion    Knee extension    Ankle dorsiflexion    Ankle plantarflexion    Ankle inversion    Ankle eversion      (*= pain, Blank rows = not tested)  LOWER EXTREMITY MMT: Deferred 2/2 time constraints   MMT Right eval Left eval  Hip flexion    Hip extension    Hip abduction    Hip adduction    Hip internal rotation    Hip external rotation    Knee flexion    Knee extension    Ankle dorsiflexion    Ankle plantarflexion    Ankle inversion    Ankle eversion    (*= pain, Blank rows = not tested)   HIP SPECIAL TESTS: Deferred 2/2 time constraints    PHYSICAL PERFORMANCE MEASURES: Deferred 2/2 time constraints   STS:  RLE SLS:  LLE SLS:  6 MWT:  10MWT:  5TSTS:   PALPATION: Deferred 2/2 time constraints  Abdominal:  Diastasis:  finger above umbilicus,  fingers at and below umbilicus  Scar mobility: present/mobile perpendicular, parallel Rib flare: present/absent  EXTERNAL PELVIC EXAM: Patient educated on the purpose of the pelvic exam and articulated understanding; patient consented to the exam verbally. Deferred 2/2 time constraints  Palpation: Breath coordination: present/absent/inconsistent Voluntary Contraction: present/absent Relaxation: full/delayed/non-relaxing Perineal movement with sustained IAP increase ("bear down"): descent/no change/elevation/excessive descent Perineal movement with rapid IAP increase ("cough"): elevation/no change/descent Pubic symphysis: (0= no contraction, 1= flicker, 2= weak squeeze, 3= fair squeeze with lift, 4= good squeeze and lift against resistance, 5= strong squeeze against strong resistance)   INTERNAL PELVIC EXAM: Patient educated on the purpose of the pelvic exam and articulated understanding; patient consented to the exam verbally. Deferred 2/2 time constraints  Introitus Appears:  Skin integrity:  Scar mobility: Strength (PERF):  Symmetry: Palpation: Prolapse: (0= no contraction, 1= flicker, 2= weak squeeze, 3= fair squeeze with lift, 4= good squeeze and lift against resistance, 5= strong squeeze against strong resistance)     Patient Education:  Patient educated on what to expect during course of physical therapy, POC, and provided with HEP including: bladder irritants handout and toileting posture. Pt also educated on bowel incontinence pads to decrease irritation or further complications of skin infection. Will provide handout next visit. Patient verbalized understanding and returned demonstration. Patient will benefit from further education in order to maximize compliance and understanding for long-term therapeutic gains.   Patient Surveys:  FOTO Urinary Problem - 37   FOTO Bowel Leakage - 52  FOTO Bowel Constipation -60    ASSESSMENT:  Clinical Impression: Patient is a 61 y.o. who was seen today for physical therapy evaluation and treatment for a chief concern of urinary and bowel leakage. Today's evaluation suggest deficits in PFM coordination, PFM strength, PFM endurance, pain, IAP management, sleep hygiene, and skin integrity as evidenced by intermittent lower abdominal worst pain 8/10 (NPRS scale), increased urinary urgency/frequency (30x/day) with significant urinary leakage enough to change clothing or cause bedwetting, nocturia 3-4x, occasional bowel leakage (2x/week) with urinary leakage causing increased skin irritation, urinary leakage with bending and laughing, and inability to participate in social activities or penetrative sex due to increased urinary leakage. Patient's responses on FOTO Urinary Problem (37), Bowel Leakage (52) and Bowel Constipation (60) indicates significant limitation/disability/distress. Patient's progress may be limited due to co-morbidities such as CKD and T2DM and time since onset; however, patient's motivation is advantageous. Pt with basic understanding of PFM function in IAP management, bowel/bladder habits, sexual function and posture. Patient will benefit from skilled therapeutic intervention to address deficits in PFM coordination, PFM strength, PFM endurance, pain, IAP  management, sleep hygiene and skin integrity in order to increase PLOF and improve overall QOL.    Objective Impairments: decreased activity tolerance, decreased coordination, decreased endurance, decreased strength, increased muscle spasms, improper body mechanics, and pain.   Activity Limitations: lifting, bending, sleeping, bed mobility, continence, toileting, hygiene/grooming, and locomotion level  Personal Factors: Age, Behavior pattern, Past/current experiences, Time since onset of injury/illness/exacerbation, and 3+ comorbidities: CHF, CKD, brain tumor benign, T2DM, HTN  are also affecting patient's functional outcome.   Rehab Potential: Good  Clinical Decision Making: Evolving/moderate complexity  Evaluation Complexity: Moderate   GOALS: Goals reviewed with patient? Yes  SHORT TERM GOALS: Target date: 03/06/2022  Patient will improve scores on FOTO Urinary Problem, Bowel Leakage, and Constipation by >/=7 points in order to demonstrate improved pelvic floor dysfunction with limited disruptions at home and in the community for an improved QOL.  Baseline: 37, 52, 60 Goal status: INITIAL    LONG TERM GOALS: Target date: 04/17/2022   Patient will report confidence in ability to control bladder >/= 5/10 in order to demonstrate improved function and ability to participate more fully in activities at home and in the community. Baseline: 1/10 Goal status: INITIAL  2.  Patient will report decreased instances of urinary leakage over a 3-week period in order to demonstrate improved bladder control and allow for increased participation in activities outside of the home. Baseline: urinary leakage every day (enough to make outer clothes wet)/changing 4-5x a day Goal status: INITIAL  3.  Patient will report decreased instances of bowel leakage over a 3-week period in order to demonstrate improved bladder control and allow for increased participation in activities outside of the  home. Baseline: (More than once a week)/up to 2x Goal status: INITIAL  4.  Patient will report being able to return to activities including, but not limited to: walking, church functions, sexual activity without limitation to indicate complete resolution of the chief complaint and return to prior level of participation at home and in the community. Baseline: unable to attend a church service without bowel/bladder leakage/not participating in sexual activity due to leakage Goal status: INITIAL  5.  Patient will report less than 5 incidents of stress urinary incontinence over the course of 3 weeks while laughing/bending/prolonged activity in order to demonstrate improved PFM coordination, strength, and function for improved overall QOL. Baseline: every time she laughs/bends over Goal status: INITIAL  6.  Patient will report decrease urinary leakage during the night in order to demonstrate improved PFM coordination, strength, improved sleep quality, and overall QOL.  Baseline: wakes up 3-4x/night and leaks every time Goal status: INITIAL  PLAN: PT Frequency: 1x/week  PT Duration: 12 weeks  Planned Interventions: Therapeutic exercises, Therapeutic activity, Neuromuscular re-education, Balance training, Gait training, Patient/Family education, Joint mobilization, Spinal mobilization, Moist heat, scar mobilization, Taping, and Manual therapy  Plan For Next Session: physical assessment, B-sure handout  [employer], PT, DPT  01/23/2022, 12:00 PM

## 2022-01-24 ENCOUNTER — Other Ambulatory Visit: Payer: Self-pay | Admitting: Nephrology

## 2022-01-24 DIAGNOSIS — N3281 Overactive bladder: Secondary | ICD-10-CM

## 2022-01-24 DIAGNOSIS — N1831 Chronic kidney disease, stage 3a: Secondary | ICD-10-CM

## 2022-01-24 DIAGNOSIS — N329 Bladder disorder, unspecified: Secondary | ICD-10-CM

## 2022-01-25 ENCOUNTER — Other Ambulatory Visit: Payer: Self-pay | Admitting: Internal Medicine

## 2022-01-25 DIAGNOSIS — F32A Depression, unspecified: Secondary | ICD-10-CM

## 2022-01-25 DIAGNOSIS — F419 Anxiety disorder, unspecified: Secondary | ICD-10-CM

## 2022-01-27 DIAGNOSIS — M542 Cervicalgia: Secondary | ICD-10-CM | POA: Diagnosis not present

## 2022-01-27 DIAGNOSIS — M25511 Pain in right shoulder: Secondary | ICD-10-CM | POA: Diagnosis not present

## 2022-01-27 DIAGNOSIS — M6281 Muscle weakness (generalized): Secondary | ICD-10-CM | POA: Diagnosis not present

## 2022-01-30 ENCOUNTER — Ambulatory Visit: Payer: Medicare HMO | Attending: Internal Medicine

## 2022-01-30 ENCOUNTER — Other Ambulatory Visit: Payer: Self-pay | Admitting: Cardiovascular Disease

## 2022-01-30 DIAGNOSIS — M6281 Muscle weakness (generalized): Secondary | ICD-10-CM | POA: Insufficient documentation

## 2022-01-30 DIAGNOSIS — M6289 Other specified disorders of muscle: Secondary | ICD-10-CM | POA: Insufficient documentation

## 2022-01-30 DIAGNOSIS — R278 Other lack of coordination: Secondary | ICD-10-CM | POA: Insufficient documentation

## 2022-02-01 ENCOUNTER — Other Ambulatory Visit: Payer: Self-pay | Admitting: Internal Medicine

## 2022-02-06 ENCOUNTER — Ambulatory Visit: Payer: Medicare HMO

## 2022-02-06 DIAGNOSIS — M6289 Other specified disorders of muscle: Secondary | ICD-10-CM

## 2022-02-06 DIAGNOSIS — R278 Other lack of coordination: Secondary | ICD-10-CM

## 2022-02-06 DIAGNOSIS — M6281 Muscle weakness (generalized): Secondary | ICD-10-CM

## 2022-02-06 NOTE — Therapy (Signed)
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 being followed by neurosurgery/eye doctor and pcp. monitoring size.causes sinus problems   Bronchitis    Cardiac arrhythmia    Cervical neck pain with evidence of disc disease    C5/6 disease, MRI done late 2012 - no records available   Chronic combined systolic and diastolic CHF (congestive heart failure) ([place])    a. 08/2010 Echo: mildly reduced EF 40-45%, mild diffuse hypokinesis; b. 06/2016 Echo: EF 50%, no rwma, mild to mod TR; c. 03/2017 Echo: EF 40-45%, Gr1 DD (prior echo reviewed and EF felt to be lower than reported).   Chronic pain    Cocaine abuse, in remission (HCC)    clean x 24 years   Colon polyps    COPD (chronic obstructive pulmonary disease) ([place])    a. 12/2018 PFT: No obvious obst/restrictive dzs.   Coronary artery disease    a. PCI of LCX 2003; b. PCI of the LAD 2012 with a (2.5 x 8 mm BMS);  c.s/p CABG 4/12: L-LAD, VG-Dx, VG-OM, VG-RCA (Dr. Prescott Gum);  d. 01/2012 MV: inf infarct, attenuation, no ischemia; e. 02/2017 MV: signifi attenuation artifact. Fixed basal antlat/inflat scar vs artifact. Reversible apical lat and mid antlat defect - ? atten vs ischemia. F/u echo w/o wma->Med rx.   Depression    Diabetes mellitus without complication ([place])    Family history of colon cancer    Generalized headaches    frequent   GERD  (gastroesophageal reflux disease)    Headache    Heart murmur    Hematochezia    Hepatitis    history of hepatitis b   History of cervical cancer    s/p cryotherapy   History of drug abuse ([place])    cocaine, marijuana, clean since 1989   History of hepatitis B    from eating undercooked liver   History of MI (myocardial infarction)    Hyperlipidemia    Hypertension    Ischemic cardiomyopathy    a. 03/2017 Echo: EF 40-45%, Gr1 DD.   Myocardial infarction ([place]) 2003, 2012   PAD (peripheral artery disease) ([place])    a. s/p Right SFA atherectomy and PTA 01/15/11; b. 07/2018 ABI: R 1.02, L 1.09.   Pituitary mass ([place])    a. 12/2018 MRI Brain: Stable pituitary mass w/ 50m area of necrosis. Mass abuts R cavernous sinus w/o definite invasion.   Polyp of colon    Seasonal allergies    Sleep apnea    uses cpap   Smoking history    quit 07/2010   Thyroid disease    Urinary incontinence    Past Surgical History:  Procedure Laterality Date  ABDOMINAL HYSTERECTOMY     2003   CHOLECYSTECTOMY  1986   COLONOSCOPY  2008   3 polyps   COLONOSCOPY WITH PROPOFOL N/A 02/06/2015   Procedure: COLONOSCOPY WITH PROPOFOL;  Surgeon: Lucilla Lame, MD;  Location: [facility] ENDOSCOPY;  Service: Endoscopy;  Laterality: N/A;   COLONOSCOPY WITH PROPOFOL N/A 01/27/2017   Procedure: COLONOSCOPY WITH PROPOFOL;  Surgeon: Lucilla Lame, MD;  Location: [facility] ENDOSCOPY;  Service: Endoscopy;  Laterality: N/A;   CORONARY ANGIOPLASTY     w/ stent placement x2   CORONARY ARTERY BYPASS GRAFT  2012   Dr [name]   CORONARY STENT PLACEMENT  2003   S/P MI   CORONARY STENT PLACEMENT  2007   [place]   ESOPHAGOGASTRODUODENOSCOPY (EGD) WITH PROPOFOL N/A 02/06/2015   Procedure: ESOPHAGOGASTRODUODENOSCOPY (EGD) WITH PROPOFOL;  Surgeon: Lucilla Lame, MD;  Location: [facility] ENDOSCOPY;  Service: Endoscopy;  Laterality: N/A;   ESOPHAGOGASTRODUODENOSCOPY (EGD) WITH PROPOFOL N/A 01/27/2017   Procedure: ESOPHAGOGASTRODUODENOSCOPY (EGD) WITH PROPOFOL;   Surgeon: Lucilla Lame, MD;  Location: [facility] ENDOSCOPY;  Service: Endoscopy;  Laterality: N/A;   ESOPHAGOGASTRODUODENOSCOPY (EGD) WITH PROPOFOL N/A 09/01/2019   Procedure: ESOPHAGOGASTRODUODENOSCOPY (EGD) WITH PROPOFOL;  Surgeon: Lin Landsman, MD;  Location: [place];  Service: Gastroenterology;  Laterality: N/A;   FEMORAL ARTERY STENT  10/2010   right sided (Dr. Burt Knack)   KNEE ARTHROSCOPY WITH MEDIAL MENISECTOMY Right 08/20/2017   Procedure: KNEE ARTHROSCOPY WITH MEDIAL  AND LATERAL MENISECTOMY;  Surgeon: Hessie Knows, MD;  Location: [facility] ORS;  Service: Orthopedics;  Laterality: Right;   POSTERIOR CERVICAL LAMINECTOMY N/A 03/08/2018   Procedure: POSTERIOR CERVICAL LAMINECTOMY-C7;  Surgeon: Deetta Perla, MD;  Location: [facility] ORS;  Service: Neurosurgery;  Laterality: N/A;   TONSILLECTOMY     TOTAL KNEE ARTHROPLASTY Right 04/14/2019   Procedure: RIGHT TOTAL KNEE ARTHROPLASTY;  Surgeon: Hessie Knows, MD;  Location: [facility] ORS;  Service: Orthopedics;  Laterality: Right;   TUBAL LIGATION     Patient Active Problem List   Diagnosis Date Noted   PND (post-nasal drip) 07/24/2021   Chronic pain of inguinal region 07/24/2021   Thrush 04/19/2021   Skin lesion 04/19/2021   Vaginal itching 04/19/2021   Vitamin D deficiency 01/14/2021   Breast mass, right 01/14/2021   Annual physical exam 01/14/2021   Pruritus of both eyes 09/21/2020   Lipoma of right lower extremity 08/30/2020   Hypertension associated with diabetes ([place]) 07/24/2020   Abnormality of both breasts on screening mammogram 05/14/2020   Bilateral hip pain 04/19/2020   Insomnia 04/19/2020   Chronic low back pain 02/22/2020   Internal hemorrhoids 02/22/2020   Benign breast cyst in female, right 02/22/2020   OSA on CPAP 12/15/2019   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[place]) 12/02/2019   Sensorimotor neuropathy 12/02/2019   Irritable bowel syndrome with both constipation and diarrhea 10/13/2019   Mild intermittent asthma 10/13/2019   PUD (peptic ulcer  disease)    Status post total knee replacement using cement, right 04/14/2019   Overactive bladder 03/10/2019   Mild persistent asthma 11/29/2018   DDD (degenerative disc disease), cervical 11/26/2018   Chronic constipation 11/26/2018   Fatty liver 11/26/2018   Adrenal adenoma, left 11/26/2018   Allergic rhinitis 08/26/2018   Bunion of right foot 08/26/2018   Fall 05/14/2018   Anxiety and depression 04/05/2018   Cervical radiculopathy 02/24/2018   Numbness and tingling in right hand 02/24/2018   Chronic neck pain 12/18/2017   Type 2 diabetes mellitus without complication, without long-term current use of insulin ([place]) 12/18/2017   Urinary  incontinence 12/18/2017   Lumbar radiculopathy 12/18/2017   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   Abnormal feces    Benign neoplasm of ascending colon    Intractable vomiting with nausea    Acute esophagogastric ulcer    Gastritis without bleeding    Vasomotor flushing 11/24/2016   Morbid obesity ([place]) 10/29/2015   Back ache 06/19/2015   Colon polyp 06/19/2015   CCF (congestive cardiac failure) ([place]) 06/19/2015   Family history of colon cancer 06/19/2015   Orthostatic hypotension 04/30/2015   Hematochezia 01/11/2015   History of colonic polyps 01/11/2015   Atypical chest pain 01/20/2014   COPD with chronic bronchitis and emphysema ([place]) 01/03/2013   Right knee pain 12/20/2011   HTN (hypertension) 09/38/1829   Systolic CHF, chronic (HCC)    History of cervical cancer    Depression    GERD (gastroesophageal reflux disease)    Seasonal allergies    History of drug abuse ([place])    PVD (peripheral vascular disease) ([place]) 02/03/2011   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   Smoking history 12/16/2010   Hyperlipidemia 08/23/2010   Coronary artery disease involving native coronary artery of native heart without angina pectoris 08/23/2010   CLAUDICATION 08/23/2010   CAROTID BRUIT 08/23/2010   SHORTNESS OF BREATH 08/23/2010    PCP: [name],  [name], MD   REFERRING PROVIDER: [name], [name], MD   REFERRING DIAG:  N32.81,N81.9 (ICD-10-CM) - Overactive bladder due to prolapse of female genital organ   THERAPY DIAG:  Other lack of coordination  Pelvic floor dysfunction  Muscle weakness (generalized)  Rationale for Evaluation and Treatment: Rehabilitation  ONSET DATE: A few years now  OCCUPATION/SOCIAL ACTIVITIES: Read, church, shopping, walking   PLOF: Independent  PERTINENT HISTORY/CHART REVIEW: CKD, CHF  Orland Mustard, MD (12/06/21) Plan Overactive bladder due to prolapse of female genital organ - Plan: Ambulatory referral to Physical Therapy, Vibegron (GEMTESA) 75 MG TABS, consider gyn or urogyn in the future if does not improve with PT   CHIEF CONCERN: Pt reports her kidneys are "so bad" and the urinary leakage has been happening for some years but this year has been the worst of it. Pt tries to perform kegels in order to hold her urine when she has the very strong urge to go to the bathroom (often). Pt is unable to make it to the bathroom without having to crunch over (forward trunk flexion) and squeeze her legs tight in order to not have leakage. However, most of the time the urinary leakage happens. Pt has to plan around where to go in the community and know where the bathroom is at all times. Pt has tried briefs and feels that her urine overflows and she has to constantly change her clothes. Pt has to change her clothes about 4-5x a day because she does not wear a brief. Pt also reports bedwetting occasionally because she cannot make it to the bathroom on time. Most recently, Pt has had bowel leakage with the urinary leakage as well. In some instances, she doesn't even notice the bowel leakage and notices when she wipes as her skin is very irritated and red. In regards to the prolapse, when Pt wipes every time she does feel "something" there but Dr did not tell her about prolapse. Pt had a bedside  commode and used for awhile but is currently not using. Pt is going to her kidney doctor today and stopped taking the OAB medicine given because doctors are trying to figure out if the OAB  medication is interfering with her kidneys/blood platelets.   LIVING ENVIRONMENT: Lives with: lives with their family Lives in: House/apartment   PATIENT GOALS: Pt be able to have control of her bladder, less leakage, not feeling a pressure at the vaginal opening. Pt would like to participate in sexual activity but fears about incontinence.     UROLOGICAL HISTORY Fluid intake: Yes: Water (tries a gallon a day per Dr.), fruit juice (pineapple, mango) , very occasionally a sprite Pain with urination: No Fully empty bladder: Yes Stream: Strong every time  Urgency: Yes:   Nocturia: 3-4x/night Frequency: "over 30x a day"  Leakage: Urge to void, Walking to the bathroom, Laughing, and Bending forward Pads: No because it hasn't helped and they cannot hold all the urine that comes out    GASTROINTESTINAL HISTORY  Pt has hx of constipation and is on medication (Linzess) Pain with bowel movement: No Type of bowel movement ([place] Stool Chart): Type 5 and 7  Fully empty rectum: Yes:   Leakage: Yes: occasionally, but just recently, does not know if that is related to the medication because it is watery , Type 7 on underwear when she notices but not everyday (2x/week) , having to wear 2 underwears in case leakage happens especially when out in the community  Pads: No   SEXUAL HISTORY/FUNCTION Pain with intercourse: No Finger stimulation: no pain, but has leakage and cannot continue  Not engaging in intercourse currently due to incontinence   OBSTETRICAL HISTORY Vaginal deliveries: G4P2 Tearing: Yes: with first child    GYNECOLOGICAL HISTORY Hysterectomy: yes, partial hysterectomy  Pelvic Organ Prolapse: see above in pertinent hx, not graded  Heaviness/pressure: sometimes   SUBJECTIVE:  Pt has  had a lot going on the past couple of weeks with being sick and a death in the family. Pt did have an episode of L sided abdominal pain when she sneezed. She tried ice and it helped a little bit, but had that pain in the L (felt like a cramp) for a couple days. This pain is different from her lower abdominal pain. Pt has not had too much of the bowel leakage but has to take the Linzess for her hx of bowel blockage.          PAIN:  Are you having pain? Yes NPRS scale: 4/10 Pain location: lower abdomen, occasionally towards the vagina    TODAY'S TREATMENT:  Pre-treatment assessment                                                                                                                                                                                OBJECTIVE:    COGNITION: Overall cognitive status: Within functional  limits for tasks assessed     POSTURE:  Iliac crest height: L iliac crest  Pelvic obliquity: WNL   SENSATION:  Light touch:  L2-L4 intact, L5-S1 impaired, no dx of peripheral neuropathy in feet but has T2DM    LUMBAR AROM/PROM:   AROM (Normal range in degrees) AROM  02/06/22  Flexion (65) WNL  Extension (25-30) WNL  Right lateral flexion (25) WNL  Left lateral flexion (25) WNL*  Right rotation (30) WNL  Left rotation (30) WNL*   (*= pain, Blank rows = not tested)  LOWER EXTREMITY ROM:    PROM (Normal range in degrees) Right 02/06/22 Left 02/06/22  Hip flexion (0-125) WNL WNL  Hip extension (0-15)    Hip abduction (0-40) WNL WNL  Hip adduction    Hip internal rotation (0-45) WNL* WNL*  Hip external rotation (0-45) WNL* WNL*  Knee flexion WNL WNL   (*= pain, Blank rows = not tested)  LOWER EXTREMITY MMT:   MMT Right 02/06/22 Left 02/06/22  Hip flexion 4 3*  Hip extension    Hip abduction    Hip adduction    Hip internal rotation 4 4  Hip external rotation 4 4  Knee flexion 4 3  Knee extension 5 5  Ankle dorsiflexion (in seated) 5 5  Ankle  plantarflexion    Ankle inversion    Ankle eversion    (*= pain, Blank rows = not tested)   HIP SPECIAL TESTS:  FADDIR: + B with groin pain  FABER: +B with groin pain    PALPATION: Abdominal:  Diastasis: none  LLQ Tenderness upon superficial palpation, pain increased closer to ASIS  Iliopsoas tender and produced lower abdomen pain with radiation to the groin   RUQ- Scar from gallbladder removal, scar tissue restriction noted along scar   RLQ- Tenderness upon deep palpation, but not as severe as the L  Towards pubic symphysis some discomfort/pain   B adductor musculature tender upon palpation    EXTERNAL PELVIC EXAM: Patient educated on the purpose of the pelvic exam and articulated understanding; patient consented to the exam verbally. Deferred 2/2 time constraints  Breath coordination: present/absent/inconsistent Voluntary Contraction: present/absent Relaxation: full/delayed/non-relaxing Perineal movement with sustained IAP increase ("bear down"): descent/no change/elevation/excessive descent Perineal movement with rapid IAP increase ("cough"): elevation/no change/descent Pubic symphysis: tender upon palpation with heal of hand  (0= no contraction, 1= flicker, 2= weak squeeze, 3= fair squeeze with lift, 4= good squeeze and lift against resistance, 5= strong squeeze against strong resistance)   Neuromuscular Re-education: Supine hooklying diaphragmatic breathing with VCs and TCs for downregulation of the nervous system and improved management of IAP  Supine adductor tension release via "butterfly pose" for pain modulation around the pelvic area   Discussion on anatomy of iliopsoas muscle and how the muscle can become shortened when there is pain (causing forward flexion) and with urinary urgency/frequency as Pt crosses her legs and leans her trunk significantly forward to avoid leakage.   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    Patient Education:  Patient provided with bowel leakage pads handout and HEP: supine diaphragmatic breathing, butterfly pose. Patient verbalized understanding and returned demonstration. Patient educated throughout session on appropriate technique and form using multi-modal cueing, HEP, and activity modification. Patient will continue to benefit from further education in order to maximize compliance and understanding for long-term therapeutic gains.    ASSESSMENT:  Clinical Impression: Patient presents to clinic with excellent motivation to participate in today's session. Upon physical assessment, Pt demonstrates deficits in PFM coordination, PFM strength, PFM endurance, pain, IAP management, LE strength, posture and sensation as evidenced by 2 trips to the bathroom during session while demonstrating an increased forward trunk leak to avoid urinary leakage, increased L iliac crest height, impaired sensation at the L5-S1 dermatomes but hx of peripheral neuropathy, increased discomfort in L spinal musculature with L lateral flexion/thoracic rotation, +FADDIR/FABER B with radiation to groin, 3/5 MMT of hip flexion causing concordant pain (7/10), LE weakness in hip flex/IR/ER/knee flex B, scar restriction from gallbladder removal with tenderness, significant tenderness in LLQ upon deeper palpation specifically at iliopsoas (8/10), tenderness at pubic symphysis, and tenderness in the RLQ radiating towards groin. Will address pelvic floor external exam next session. Pt with understanding of hip musculature anatomy and appreciation of their attachments to the pelvis (hip flexors, adductors, PFM). Pt responded well to active and educational interventions. Patient will benefit from skilled therapeutic intervention to address deficits in PFM coordination, PFM strength, PFM endurance, pain, IAP management, LE strength, posture, and sensation  in order to increase PLOF and improve overall QOL.    Objective Impairments: decreased activity tolerance, decreased coordination, decreased endurance, decreased strength, increased muscle spasms, improper body mechanics, and pain.   Activity Limitations: lifting, bending, sleeping, bed mobility, continence, toileting, hygiene/grooming, and locomotion level  Personal Factors: Age, Behavior pattern, Past/current experiences, Time since onset of injury/illness/exacerbation, and 3+ comorbidities: CHF, CKD, brain tumor benign, T2DM, HTN  are also affecting patient's functional outcome.   Rehab Potential: Good  Clinical Decision Making: Evolving/moderate complexity  Evaluation Complexity: Moderate   GOALS: Goals reviewed with patient? Yes  SHORT TERM GOALS: Target date: 03/20/2022  Patient will improve scores on FOTO Urinary Problem, Bowel Leakage, and Constipation by >/=7 points in order to demonstrate improved pelvic floor dysfunction with limited disruptions at home and in the community for an improved QOL.  Baseline: 37, 52, 60 Goal status: INITIAL    LONG TERM GOALS: Target date: 05/01/2022   Patient will report confidence in ability to control bladder >/= 5/10 in order to demonstrate improved function and ability to participate more fully in activities at home and in the community. Baseline: 1/10 Goal status: INITIAL  2.  Patient will report decreased instances of urinary leakage over a 3-week period in order to demonstrate improved bladder control and allow for increased participation in activities outside of the home. Baseline: urinary leakage every day (enough to make outer clothes wet)/changing 4-5x a day Goal status: INITIAL  3.  Patient will report decreased instances of bowel leakage over a 3-week period in order to demonstrate improved bladder control and allow for increased participation in activities outside of the home. Baseline: (More than once a week)/up to 2x Goal  status: INITIAL  4.  Patient will report being able to return to activities including, but not limited to: walking, church functions, sexual activity without limitation to indicate complete resolution of the chief complaint and return to prior level of participation at home and in the community. Baseline: unable to attend a church service without bowel/bladder leakage/not participating in sexual activity due to leakage Goal status: INITIAL  5.  Patient will report less than 5 incidents of stress urinary incontinence over the course of 3 weeks while laughing/bending/prolonged activity in order to demonstrate improved PFM coordination, strength, and function for improved overall QOL. Baseline: every time she laughs/bends over Goal status: INITIAL  6.  Patient will report decrease urinary leakage during the night in order to demonstrate improved PFM coordination, strength, improved sleep quality, and overall QOL.  Baseline: wakes up 3-4x/night and leaks every time Goal status: INITIAL  PLAN: PT Frequency: 1x/week  PT Duration: 12 weeks  Planned Interventions: Therapeutic exercises, Therapeutic activity, Neuromuscular re-education, Balance training, Gait training, Patient/Family education, Joint mobilization, Spinal mobilization, Moist heat, scar mobilization, Taping, and Manual therapy  Plan For Next Session: review bladder diary, external PFM, do myofascial technique, L iliopsoas,   Kassidee Narciso, PT, DPT  02/06/2022, 11:49 AM

## 2022-02-10 ENCOUNTER — Ambulatory Visit: Payer: Medicare HMO

## 2022-02-11 ENCOUNTER — Other Ambulatory Visit: Payer: Self-pay | Admitting: Internal Medicine

## 2022-02-11 DIAGNOSIS — G629 Polyneuropathy, unspecified: Secondary | ICD-10-CM

## 2022-02-12 DIAGNOSIS — G4710 Hypersomnia, unspecified: Secondary | ICD-10-CM | POA: Diagnosis not present

## 2022-02-12 DIAGNOSIS — G4733 Obstructive sleep apnea (adult) (pediatric): Secondary | ICD-10-CM | POA: Diagnosis not present

## 2022-02-12 DIAGNOSIS — I259 Chronic ischemic heart disease, unspecified: Secondary | ICD-10-CM | POA: Diagnosis not present

## 2022-02-13 ENCOUNTER — Ambulatory Visit: Payer: Medicare HMO

## 2022-02-13 DIAGNOSIS — I259 Chronic ischemic heart disease, unspecified: Secondary | ICD-10-CM | POA: Diagnosis not present

## 2022-02-13 DIAGNOSIS — G4733 Obstructive sleep apnea (adult) (pediatric): Secondary | ICD-10-CM | POA: Diagnosis not present

## 2022-02-13 DIAGNOSIS — G4710 Hypersomnia, unspecified: Secondary | ICD-10-CM | POA: Diagnosis not present

## 2022-02-14 ENCOUNTER — Ambulatory Visit
Admission: RE | Admit: 2022-02-14 | Discharge: 2022-02-14 | Disposition: A | Discharge status: Home / Self Care | Payer: Medicare HMO | Source: Ambulatory Visit | Attending: Nephrology | Admitting: Nephrology

## 2022-02-14 DIAGNOSIS — N1831 Chronic kidney disease, stage 3a: Secondary | ICD-10-CM | POA: Diagnosis not present

## 2022-02-14 DIAGNOSIS — N3281 Overactive bladder: Secondary | ICD-10-CM

## 2022-02-14 DIAGNOSIS — N329 Bladder disorder, unspecified: Secondary | ICD-10-CM | POA: Diagnosis not present

## 2022-02-17 ENCOUNTER — Telehealth: Payer: Self-pay | Admitting: Internal Medicine

## 2022-02-17 ENCOUNTER — Ambulatory Visit: Payer: Medicare HMO | Admitting: *Deleted

## 2022-02-17 DIAGNOSIS — I5022 Chronic systolic (congestive) heart failure: Secondary | ICD-10-CM

## 2022-02-17 NOTE — Telephone Encounter (Signed)
Pt called stating she is requesting allergy medication. Pt states that the provider prescribe this to her when she has allergy symptoms

## 2022-02-17 NOTE — Chronic Care Management (AMB) (Signed)
  Care Management   Outreach Note  02/17/2022 Name: [name] MRN: [id number] DOB: 1960-12-18  Successful outreach to patient.  States she is doing well without complaints.  Discussed goals and both agree patient has met goals of the program.  Follow Up Plan:  The patient has been provided with contact information for the care management team and has been advised to call with any health-related questions or concerns.  No further follow up required: as personal goals have been met.  Hubert Azure RN, MSN RN Care Management Coordinator [place] [phone] [email or web address]

## 2022-02-17 NOTE — Patient Instructions (Signed)
CONGRATULATIONS ON COMPLETING YOUR GOALS.  IT AS BEEN A PLEASURE WORKING WITH AND TALKING TO YOU.  IF  NEEDS [place] IN THE FUTURE PLEASE DO NOT HESITATE TO CONTACT ME  [phone]  Michai Dieppa RN, MSN RN Care Management Coordinator  [facility] [phone] [email or web address]  

## 2022-02-17 NOTE — Telephone Encounter (Signed)
All of her medications have refills and are at the pharmacy  Xyzal Singulair  Nasal saline nose spray Pataday eye drops  Azestaline nose spray    All these are at the pharmacy and available to her was there a specific one she was referencing?

## 2022-02-19 ENCOUNTER — Telehealth: Payer: Self-pay | Admitting: Pulmonary Disease

## 2022-02-19 NOTE — Telephone Encounter (Signed)
Spoke to patient. She is requesting refill on duoneb.  According to our records, PCP prescribed duoneb on 12/06/2021 with 11 refills. Patient will contact pharmacy.  Nothing further needed.

## 2022-02-20 ENCOUNTER — Ambulatory Visit: Payer: Medicare HMO

## 2022-02-20 DIAGNOSIS — M6281 Muscle weakness (generalized): Secondary | ICD-10-CM

## 2022-02-20 DIAGNOSIS — R278 Other lack of coordination: Secondary | ICD-10-CM

## 2022-02-20 DIAGNOSIS — M6289 Other specified disorders of muscle: Secondary | ICD-10-CM

## 2022-02-20 NOTE — Therapy (Signed)
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 being followed by neurosurgery/eye doctor and pcp. monitoring size.causes sinus problems   Bronchitis    Cardiac arrhythmia    Cervical neck pain with evidence of disc disease    C5/6 disease, MRI done late 2012 - no records available   Chronic combined systolic and diastolic CHF (congestive heart failure) ([place])    a. 08/2010 Echo: mildly reduced EF 40-45%, mild diffuse hypokinesis; b. 06/2016 Echo: EF 50%, no rwma, mild to mod TR; c. 03/2017 Echo: EF 40-45%, Gr1 DD (prior echo reviewed and EF felt to be lower than reported).   Chronic pain    Cocaine abuse, in remission (HCC)    clean x 24 years   Colon polyps    COPD (chronic obstructive pulmonary disease) ([place])    a. 12/2018 PFT: No obvious obst/restrictive dzs.   Coronary artery disease    a. PCI of LCX 2003; b. PCI of the LAD 2012 with a (2.5 x 8 mm BMS);  c.s/p CABG 4/12: L-LAD, VG-Dx, VG-OM, VG-RCA (Dr. Prescott Gum);  d. 01/2012 MV: inf infarct, attenuation, no ischemia; e. 02/2017 MV: signifi attenuation artifact. Fixed basal antlat/inflat scar vs artifact. Reversible apical lat and mid antlat defect - ? atten vs ischemia. F/u echo w/o wma->Med rx.   Depression    Diabetes mellitus without complication ([place])    Family history of colon cancer    Generalized headaches    frequent   GERD  (gastroesophageal reflux disease)    Headache    Heart murmur    Hematochezia    Hepatitis    history of hepatitis b   History of cervical cancer    s/p cryotherapy   History of drug abuse ([place])    cocaine, marijuana, clean since 1989   History of hepatitis B    from eating undercooked liver   History of MI (myocardial infarction)    Hyperlipidemia    Hypertension    Ischemic cardiomyopathy    a. 03/2017 Echo: EF 40-45%, Gr1 DD.   Myocardial infarction ([place]) 2003, 2012   PAD (peripheral artery disease) ([place])    a. s/p Right SFA atherectomy and PTA 01/15/11; b. 07/2018 ABI: R 1.02, L 1.09.   Pituitary mass ([place])    a. 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 Mass abuts R cavernous sinus w/o definite invasion.   Polyp of colon    Seasonal allergies    Sleep apnea    uses cpap   Smoking history    quit 07/2010   Thyroid disease    Urinary incontinence    Past Surgical History:  Procedure Laterality Date  ABDOMINAL HYSTERECTOMY     2003   CHOLECYSTECTOMY  1986   COLONOSCOPY  2008   3 polyps   COLONOSCOPY WITH PROPOFOL N/A 02/06/2015   Procedure: COLONOSCOPY WITH PROPOFOL;  Surgeon: Lucilla Lame, MD;  Location: [facility] ENDOSCOPY;  Service: Endoscopy;  Laterality: N/A;   COLONOSCOPY WITH PROPOFOL N/A 01/27/2017   Procedure: COLONOSCOPY WITH PROPOFOL;  Surgeon: Lucilla Lame, MD;  Location: [facility] ENDOSCOPY;  Service: Endoscopy;  Laterality: N/A;   CORONARY ANGIOPLASTY     w/ stent placement x2   CORONARY ARTERY BYPASS GRAFT  2012   Dr [name]   CORONARY STENT PLACEMENT  2003   S/P MI   CORONARY STENT PLACEMENT  2007   [place]   ESOPHAGOGASTRODUODENOSCOPY (EGD) WITH PROPOFOL N/A 02/06/2015   Procedure: ESOPHAGOGASTRODUODENOSCOPY (EGD) WITH PROPOFOL;  Surgeon: Lucilla Lame, MD;  Location: [facility] ENDOSCOPY;  Service: Endoscopy;  Laterality: N/A;   ESOPHAGOGASTRODUODENOSCOPY (EGD) WITH PROPOFOL N/A 01/27/2017   Procedure: ESOPHAGOGASTRODUODENOSCOPY (EGD) WITH PROPOFOL;   Surgeon: Lucilla Lame, MD;  Location: [facility] ENDOSCOPY;  Service: Endoscopy;  Laterality: N/A;   ESOPHAGOGASTRODUODENOSCOPY (EGD) WITH PROPOFOL N/A 09/01/2019   Procedure: ESOPHAGOGASTRODUODENOSCOPY (EGD) WITH PROPOFOL;  Surgeon: Lin Landsman, MD;  Location: [place];  Service: Gastroenterology;  Laterality: N/A;   FEMORAL ARTERY STENT  10/2010   right sided (Dr. Burt Knack)   KNEE ARTHROSCOPY WITH MEDIAL MENISECTOMY Right 08/20/2017   Procedure: KNEE ARTHROSCOPY WITH MEDIAL  AND LATERAL MENISECTOMY;  Surgeon: Hessie Knows, MD;  Location: [facility] ORS;  Service: Orthopedics;  Laterality: Right;   POSTERIOR CERVICAL LAMINECTOMY N/A 03/08/2018   Procedure: POSTERIOR CERVICAL LAMINECTOMY-C7;  Surgeon: Deetta Perla, MD;  Location: [facility] ORS;  Service: Neurosurgery;  Laterality: N/A;   TONSILLECTOMY     TOTAL KNEE ARTHROPLASTY Right 04/14/2019   Procedure: RIGHT TOTAL KNEE ARTHROPLASTY;  Surgeon: Hessie Knows, MD;  Location: [facility] ORS;  Service: Orthopedics;  Laterality: Right;   TUBAL LIGATION     Patient Active Problem List   Diagnosis Date Noted   PND (post-nasal drip) 07/24/2021   Chronic pain of inguinal region 07/24/2021   Thrush 04/19/2021   Skin lesion 04/19/2021   Vaginal itching 04/19/2021   Vitamin D deficiency 01/14/2021   Breast mass, right 01/14/2021   Annual physical exam 01/14/2021   Pruritus of both eyes 09/21/2020   Lipoma of right lower extremity 08/30/2020   Hypertension associated with diabetes ([place]) 07/24/2020   Abnormality of both breasts on screening mammogram 05/14/2020   Bilateral hip pain 04/19/2020   Insomnia 04/19/2020   Chronic low back pain 02/22/2020   Internal hemorrhoids 02/22/2020   Benign breast cyst in female, right 02/22/2020   OSA on CPAP 12/15/2019   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[place]) 12/02/2019   Sensorimotor neuropathy 12/02/2019   Irritable bowel syndrome with both constipation and diarrhea 10/13/2019   Mild intermittent asthma 10/13/2019   PUD (peptic ulcer  disease)    Status post total knee replacement using cement, right 04/14/2019   Overactive bladder 03/10/2019   Mild persistent asthma 11/29/2018   DDD (degenerative disc disease), cervical 11/26/2018   Chronic constipation 11/26/2018   Fatty liver 11/26/2018   Adrenal adenoma, left 11/26/2018   Allergic rhinitis 08/26/2018   Bunion of right foot 08/26/2018   Fall 05/14/2018   Anxiety and depression 04/05/2018   Cervical radiculopathy 02/24/2018   Numbness and tingling in right hand 02/24/2018   Chronic neck pain 12/18/2017   Type 2 diabetes mellitus without complication, without long-term current use of insulin ([place]) 12/18/2017   Urinary  incontinence 12/18/2017   Lumbar radiculopathy 12/18/2017   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   Abnormal feces    Benign neoplasm of ascending colon    Intractable vomiting with nausea    Acute esophagogastric ulcer    Gastritis without bleeding    Vasomotor flushing 11/24/2016   Morbid obesity ([place]) 10/29/2015   Back ache 06/19/2015   Colon polyp 06/19/2015   CCF (congestive cardiac failure) ([place]) 06/19/2015   Family history of colon cancer 06/19/2015   Orthostatic hypotension 04/30/2015   Hematochezia 01/11/2015   History of colonic polyps 01/11/2015   Atypical chest pain 01/20/2014   COPD with chronic bronchitis and emphysema ([place]) 01/03/2013   Right knee pain 12/20/2011   HTN (hypertension) 95/18/8416   Systolic CHF, chronic (HCC)    History of cervical cancer    Depression    GERD (gastroesophageal reflux disease)    Seasonal allergies    History of drug abuse ([place])    PVD (peripheral vascular disease) ([place]) 02/03/2011   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   Smoking history 12/16/2010   Hyperlipidemia 08/23/2010   Coronary artery disease involving native coronary artery of native heart without angina pectoris 08/23/2010   CLAUDICATION 08/23/2010   CAROTID BRUIT 08/23/2010   SHORTNESS OF BREATH 08/23/2010    PCP: [name],  [name], MD   REFERRING PROVIDER: [name], [name], MD   REFERRING DIAG:  N32.81,N81.9 (ICD-10-CM) - Overactive bladder due to prolapse of female genital organ   THERAPY DIAG:  Other lack of coordination  Pelvic floor dysfunction  Muscle weakness (generalized)  Rationale for Evaluation and Treatment: Rehabilitation  ONSET DATE: A few years now  OCCUPATION/SOCIAL ACTIVITIES: Read, church, shopping, walking   PLOF: Independent  PERTINENT HISTORY/CHART REVIEW: CKD, CHF  Orland Mustard, MD (12/06/21) Plan Overactive bladder due to prolapse of female genital organ - Plan: Ambulatory referral to Physical Therapy, Vibegron (GEMTESA) 75 MG TABS, consider gyn or urogyn in the future if does not improve with PT   CHIEF CONCERN: Pt reports her kidneys are "so bad" and the urinary leakage has been happening for some years but this year has been the worst of it. Pt tries to perform kegels in order to hold her urine when she has the very strong urge to go to the bathroom (often). Pt is unable to make it to the bathroom without having to crunch over (forward trunk flexion) and squeeze her legs tight in order to not have leakage. However, most of the time the urinary leakage happens. Pt has to plan around where to go in the community and know where the bathroom is at all times. Pt has tried briefs and feels that her urine overflows and she has to constantly change her clothes. Pt has to change her clothes about 4-5x a day because she does not wear a brief. Pt also reports bedwetting occasionally because she cannot make it to the bathroom on time. Most recently, Pt has had bowel leakage with the urinary leakage as well. In some instances, she doesn't even notice the bowel leakage and notices when she wipes as her skin is very irritated and red. In regards to the prolapse, when Pt wipes every time she does feel "something" there but Dr did not tell her about prolapse. Pt had a bedside  commode and used for awhile but is currently not using. Pt is going to her kidney doctor today and stopped taking the OAB medicine given because doctors are trying to figure out if the OAB  medication is interfering with her kidneys/blood platelets.   LIVING ENVIRONMENT: Lives with: lives with their family Lives in: House/apartment   PATIENT GOALS: Pt be able to have control of her bladder, less leakage, not feeling a pressure at the vaginal opening. Pt would like to participate in sexual activity but fears about incontinence.     UROLOGICAL HISTORY Fluid intake: Yes: Water (tries a gallon a day per Dr.), fruit juice (pineapple, mango) , very occasionally a sprite Pain with urination: No Fully empty bladder: Yes Stream: Strong every time  Urgency: Yes:   Nocturia: 3-4x/night Frequency: "over 30x a day"  Leakage: Urge to void, Walking to the bathroom, Laughing, and Bending forward Pads: No because it hasn't helped and they cannot hold all the urine that comes out    GASTROINTESTINAL HISTORY  Pt has hx of constipation and is on medication ([place]) Pain with bowel movement: No Type of bowel movement ([place] Stool Chart): Type 5 and 7  Fully empty rectum: Yes:   Leakage: Yes: occasionally, but just recently, does not know if that is related to the medication because it is watery , Type 7 on underwear when she notices but not everyday (2x/week) , having to wear 2 underwears in case leakage happens especially when out in the community  Pads: No   SEXUAL HISTORY/FUNCTION Pain with intercourse: No Finger stimulation: no pain, but has leakage and cannot continue  Not engaging in intercourse currently due to incontinence   OBSTETRICAL HISTORY Vaginal deliveries: G4P2 Tearing: Yes: with first child    GYNECOLOGICAL HISTORY Hysterectomy: yes, partial hysterectomy  Pelvic Organ Prolapse: see above in pertinent hx, not graded  Heaviness/pressure: sometimes    SUBJECTIVE:  Pt has  been sick and having some external stressors.   PAIN:  Are you having pain? Yes NPRS scale: 10/10 Pain location: lower abdomen, pressure at the vagina                                                                                                                                                                             OBJECTIVE:    COGNITION: Overall cognitive status: Within functional limits for tasks assessed     POSTURE:  Iliac crest height: L iliac crest  Pelvic obliquity: WNL   SENSATION:  Light touch:  L2-L4 intact, L5-S1 impaired, no dx of peripheral neuropathy in feet but has T2DM    LUMBAR AROM/PROM:   AROM (Normal range in degrees) AROM  02/06/22  Flexion (65) WNL  Extension (25-30) WNL  Right lateral flexion (25) WNL  Left lateral flexion (25) WNL*  Right rotation (30) WNL  Left rotation (30) WNL*   (*= pain, Blank rows = not tested)  LOWER  EXTREMITY ROM:    PROM (Normal range in degrees) Right 02/06/22 Left 02/06/22  Hip flexion (0-125) WNL WNL  Hip extension (0-15)    Hip abduction (0-40) WNL WNL  Hip adduction    Hip internal rotation (0-45) WNL* WNL*  Hip external rotation (0-45) WNL* WNL*  Knee flexion WNL WNL   (*= pain, Blank rows = not tested)  LOWER EXTREMITY MMT:   MMT Right 02/06/22 Left 02/06/22  Hip flexion 4 3*  Hip extension    Hip abduction    Hip adduction    Hip internal rotation 4 4  Hip external rotation 4 4  Knee flexion 4 3  Knee extension 5 5  Ankle dorsiflexion (in seated) 5 5  Ankle plantarflexion    Ankle inversion    Ankle eversion    (*= pain, Blank rows = not tested)   HIP SPECIAL TESTS:  FADDIR: + B with groin pain  FABER: +B with groin pain    PALPATION: Abdominal:  Diastasis: none  LLQ Tenderness upon superficial palpation, pain increased closer to ASIS  Iliopsoas tender and produced lower abdomen pain with radiation to the groin   RUQ- Scar from gallbladder removal, scar tissue restriction  noted along scar   RLQ- Tenderness upon deep palpation, but not as severe as the L  Towards pubic symphysis some discomfort/pain   B adductor musculature tender upon palpation    EXTERNAL PELVIC EXAM: Patient educated on the purpose of the pelvic exam and articulated understanding; patient consented to the exam verbally. Deferred 2/2 time constraints  Breath coordination: present/absent/inconsistent Voluntary Contraction: present/absent Relaxation: full/delayed/non-relaxing Perineal movement with sustained IAP increase ("bear down"): descent/no change/elevation/excessive descent Perineal movement with rapid IAP increase ("cough"): elevation/no change/descent Pubic symphysis: tender upon palpation with heal of hand  (0= no contraction, 1= flicker, 2= weak squeeze, 3= fair squeeze with lift, 4= good squeeze and lift against resistance, 5= strong squeeze against strong resistance)    TODAY'S TREATMENT  Manual Therapy: Myofascial abdominal release for improved abdominal tension and pain modulation (pressure felt at the vagina in beginning of session)  Upon initial palpation, significant tension felt throughout abdominal cavity   Brief discussion of pain neuroscience during manual technique.   Neuromuscular Re-education: Review of bladder diary (increased urgency but has full bladder- up to 25 seconds of urine flow) Discussion on urinary urgency control via breathing, seated heel raises and distraction. Gave handout   Supine hooklying diaphragmatic breathing with VCs and TCs for downregulation of the nervous system and improved management of IAP  Supine hip flexor stretch for improved tissue extensibility and pain modulation   Discussion on use of incontinence pads to decrease forward trunk flexion and increased tension of all gluteal/hip musculature in order to avoid leakage down the pants. DPT encouraged Pt that with improved ability to activate the deep core and other techniques in  PFPT, Pt noticed a decrease in level used of incontinence pads or even use of a pantyliner. Pt verbalized understanding and will consider wearing pads.   Patient response to interventions: Pt with 0/10 pain at lower abdominals and decreased pressure felt at the vagina     Patient Education:  Patient provided with HEP: supine hip flexor stretch. Patient educated throughout session on appropriate technique and form using multi-modal cueing, HEP, and activity modification. Patient will continue to benefit from further education in order to maximize compliance and understanding for long-term therapeutic gains.    ASSESSMENT:  Clinical Impression: Patient presents to clinic with  excellent motivation to participate in today's session. Pt continues to demonstrate deficits in PFM coordination, PFM strength, PFM endurance, pain, IAP management, LE strength, posture and sensation. Pt arrived with 10/10 pain in the lower abdominals with increased pressure felt at the vaginal opening. Review and discussion of bladder diary and given handout on urinary urgency control. Also, a discussion on brain bladder connection. Upon palpation prior to manual intervention, Pt with significant tension in the abdominal cavity. After increased time and cueing for diaphragmatic breathing during manual intervention, tension decreased and Pt reports 0/10 pain and decreased pain at the vagina. Pt responded well to active, manual, and educational interventions. Patient will continue to benefit from skilled therapeutic intervention to address deficits in PFM coordination, PFM strength, PFM endurance, pain, IAP management, LE strength, posture, and sensation in order to increase PLOF and improve overall QOL.    Objective Impairments: decreased activity tolerance, decreased coordination, decreased endurance, decreased strength, increased muscle spasms, improper body mechanics, and pain.   Activity Limitations: lifting, bending,  sleeping, bed mobility, continence, toileting, hygiene/grooming, and locomotion level  Personal Factors: Age, Behavior pattern, Past/current experiences, Time since onset of injury/illness/exacerbation, and 3+ comorbidities: CHF, CKD, brain tumor benign, T2DM, HTN  are also affecting patient's functional outcome.   Rehab Potential: Good  Clinical Decision Making: Evolving/moderate complexity  Evaluation Complexity: Moderate   GOALS: Goals reviewed with patient? Yes  SHORT TERM GOALS: Target date: 04/03/2022  Patient will improve scores on FOTO Urinary Problem, Bowel Leakage, and Constipation by >/=7 points in order to demonstrate improved pelvic floor dysfunction with limited disruptions at home and in the community for an improved QOL.  Baseline: 37, 52, 60 Goal status: INITIAL    LONG TERM GOALS: Target date: 05/15/2022   Patient will report confidence in ability to control bladder >/= 5/10 in order to demonstrate improved function and ability to participate more fully in activities at home and in the community. Baseline: 1/10 Goal status: INITIAL  2.  Patient will report decreased instances of urinary leakage over a 3-week period in order to demonstrate improved bladder control and allow for increased participation in activities outside of the home. Baseline: urinary leakage every day (enough to make outer clothes wet)/changing 4-5x a day Goal status: INITIAL  3.  Patient will report decreased instances of bowel leakage over a 3-week period in order to demonstrate improved bladder control and allow for increased participation in activities outside of the home. Baseline: (More than once a week)/up to 2x Goal status: INITIAL  4.  Patient will report being able to return to activities including, but not limited to: walking, church functions, sexual activity without limitation to indicate complete resolution of the chief complaint and return to prior level of participation at home  and in the community. Baseline: unable to attend a church service without bowel/bladder leakage/not participating in sexual activity due to leakage Goal status: INITIAL  5.  Patient will report less than 5 incidents of stress urinary incontinence over the course of 3 weeks while laughing/bending/prolonged activity in order to demonstrate improved PFM coordination, strength, and function for improved overall QOL. Baseline: every time she laughs/bends over Goal status: INITIAL  6.  Patient will report decrease urinary leakage during the night in order to demonstrate improved PFM coordination, strength, improved sleep quality, and overall QOL.  Baseline: wakes up 3-4x/night and leaks every time Goal status: INITIAL  PLAN: PT Frequency: 1x/week  PT Duration: 12 weeks  Planned Interventions: Therapeutic exercises, Therapeutic activity, Neuromuscular re-education,  Balance training, Gait training, Patient/Family education, Joint mobilization, Spinal mobilization, Moist heat, scar mobilization, Taping, and Manual therapy  Plan For Next Session: PFM external exam, begin deep core, myofascial?   Brittinie Wherley, PT, DPT  02/20/2022, 10:15 AM

## 2022-02-21 ENCOUNTER — Other Ambulatory Visit: Payer: Self-pay | Admitting: Internal Medicine

## 2022-02-21 ENCOUNTER — Other Ambulatory Visit: Payer: Self-pay | Admitting: Cardiovascular Disease

## 2022-02-21 DIAGNOSIS — G4710 Hypersomnia, unspecified: Secondary | ICD-10-CM | POA: Diagnosis not present

## 2022-02-21 DIAGNOSIS — G4733 Obstructive sleep apnea (adult) (pediatric): Secondary | ICD-10-CM | POA: Diagnosis not present

## 2022-02-21 DIAGNOSIS — J4520 Mild intermittent asthma, uncomplicated: Secondary | ICD-10-CM

## 2022-02-21 DIAGNOSIS — I259 Chronic ischemic heart disease, unspecified: Secondary | ICD-10-CM | POA: Diagnosis not present

## 2022-02-25 DIAGNOSIS — M6281 Muscle weakness (generalized): Secondary | ICD-10-CM | POA: Diagnosis not present

## 2022-02-25 DIAGNOSIS — M542 Cervicalgia: Secondary | ICD-10-CM | POA: Diagnosis not present

## 2022-02-25 DIAGNOSIS — M25511 Pain in right shoulder: Secondary | ICD-10-CM | POA: Diagnosis not present

## 2022-02-27 ENCOUNTER — Ambulatory Visit: Payer: Medicare HMO

## 2022-02-28 DIAGNOSIS — M25511 Pain in right shoulder: Secondary | ICD-10-CM | POA: Diagnosis not present

## 2022-02-28 DIAGNOSIS — M542 Cervicalgia: Secondary | ICD-10-CM | POA: Diagnosis not present

## 2022-02-28 DIAGNOSIS — M6281 Muscle weakness (generalized): Secondary | ICD-10-CM | POA: Diagnosis not present

## 2022-03-04 DIAGNOSIS — E236 Other disorders of pituitary gland: Secondary | ICD-10-CM | POA: Diagnosis not present

## 2022-03-04 DIAGNOSIS — R519 Headache, unspecified: Secondary | ICD-10-CM | POA: Diagnosis not present

## 2022-03-06 ENCOUNTER — Telehealth: Payer: Self-pay

## 2022-03-06 ENCOUNTER — Ambulatory Visit: Payer: Medicare HMO | Attending: Internal Medicine

## 2022-03-06 DIAGNOSIS — H40003 Preglaucoma, unspecified, bilateral: Secondary | ICD-10-CM | POA: Diagnosis not present

## 2022-03-06 DIAGNOSIS — R278 Other lack of coordination: Secondary | ICD-10-CM | POA: Insufficient documentation

## 2022-03-06 DIAGNOSIS — M6281 Muscle weakness (generalized): Secondary | ICD-10-CM | POA: Insufficient documentation

## 2022-03-06 DIAGNOSIS — M6289 Other specified disorders of muscle: Secondary | ICD-10-CM | POA: Insufficient documentation

## 2022-03-06 NOTE — Telephone Encounter (Signed)
LVM on cell about no show today. Asked to call back.

## 2022-03-11 ENCOUNTER — Emergency Department: Payer: Medicare HMO

## 2022-03-11 ENCOUNTER — Emergency Department
Admission: EM | Admit: 2022-03-11 | Discharge: 2022-03-11 | Disposition: A | Discharge status: Home / Self Care | Payer: Medicare HMO | Attending: Emergency Medicine | Admitting: Emergency Medicine

## 2022-03-11 ENCOUNTER — Telehealth: Payer: Self-pay | Admitting: Internal Medicine

## 2022-03-11 ENCOUNTER — Other Ambulatory Visit: Payer: Self-pay

## 2022-03-11 ENCOUNTER — Encounter: Payer: Self-pay | Admitting: Emergency Medicine

## 2022-03-11 ENCOUNTER — Telehealth: Payer: Self-pay

## 2022-03-11 DIAGNOSIS — J45909 Unspecified asthma, uncomplicated: Secondary | ICD-10-CM | POA: Diagnosis not present

## 2022-03-11 DIAGNOSIS — R42 Dizziness and giddiness: Secondary | ICD-10-CM | POA: Insufficient documentation

## 2022-03-11 DIAGNOSIS — E119 Type 2 diabetes mellitus without complications: Secondary | ICD-10-CM | POA: Insufficient documentation

## 2022-03-11 DIAGNOSIS — J449 Chronic obstructive pulmonary disease, unspecified: Secondary | ICD-10-CM | POA: Insufficient documentation

## 2022-03-11 DIAGNOSIS — Z951 Presence of aortocoronary bypass graft: Secondary | ICD-10-CM | POA: Diagnosis not present

## 2022-03-11 DIAGNOSIS — I5020 Unspecified systolic (congestive) heart failure: Secondary | ICD-10-CM | POA: Diagnosis not present

## 2022-03-11 DIAGNOSIS — Z743 Need for continuous supervision: Secondary | ICD-10-CM | POA: Diagnosis not present

## 2022-03-11 DIAGNOSIS — D352 Benign neoplasm of pituitary gland: Secondary | ICD-10-CM | POA: Insufficient documentation

## 2022-03-11 DIAGNOSIS — I2510 Atherosclerotic heart disease of native coronary artery without angina pectoris: Secondary | ICD-10-CM | POA: Diagnosis not present

## 2022-03-11 DIAGNOSIS — Z8541 Personal history of malignant neoplasm of cervix uteri: Secondary | ICD-10-CM | POA: Diagnosis not present

## 2022-03-11 DIAGNOSIS — I110 Hypertensive heart disease with heart failure: Secondary | ICD-10-CM | POA: Insufficient documentation

## 2022-03-11 DIAGNOSIS — R0789 Other chest pain: Secondary | ICD-10-CM | POA: Diagnosis not present

## 2022-03-11 DIAGNOSIS — R059 Cough, unspecified: Secondary | ICD-10-CM | POA: Diagnosis not present

## 2022-03-11 DIAGNOSIS — R042 Hemoptysis: Secondary | ICD-10-CM | POA: Insufficient documentation

## 2022-03-11 DIAGNOSIS — J9811 Atelectasis: Secondary | ICD-10-CM | POA: Diagnosis not present

## 2022-03-11 DIAGNOSIS — R0602 Shortness of breath: Secondary | ICD-10-CM | POA: Diagnosis not present

## 2022-03-11 LAB — BASIC METABOLIC PANEL
Anion gap: 7 (ref 5–15)
BUN: 15 mg/dL (ref 8–23)
CO2: 23 mmol/L (ref 22–32)
Calcium: 9.2 mg/dL (ref 8.9–10.3)
Chloride: 112 mmol/L — ABNORMAL HIGH (ref 98–111)
Creatinine, Ser: 1.04 mg/dL — ABNORMAL HIGH (ref 0.44–1.00)
GFR, Estimated: >60 mL/min (ref >60)
Glucose, Bld: 109 mg/dL — ABNORMAL HIGH (ref 70–99)
Potassium: 3.8 mmol/L (ref 3.5–5.1)
Sodium: 142 mmol/L (ref 135–145)

## 2022-03-11 LAB — CBC
HCT: 48.5 % — ABNORMAL HIGH (ref 36.0–46.0)
Hemoglobin: 15.5 g/dL — ABNORMAL HIGH (ref 12.0–15.0)
MCH: 30.6 pg (ref 26.0–34.0)
MCHC: 32 g/dL (ref 30.0–36.0)
MCV: 95.7 fL (ref 80.0–100.0)
Platelets: 152 10*3/uL (ref 150–400)
RBC: 5.07 MIL/uL (ref 3.87–5.11)
RDW: 14.1 % (ref 11.5–15.5)
WBC: 4.5 10*3/uL (ref 4.0–10.5)
nRBC: 0 % (ref 0.0–0.2)

## 2022-03-11 LAB — TROPONIN I (HIGH SENSITIVITY)
Troponin I (High Sensitivity): 13 ng/L (ref <18)
Troponin I (High Sensitivity): 14 ng/L (ref <18)

## 2022-03-11 MED ORDER — IOHEXOL 350 MG/ML SOLN
75.0000 mL | Freq: Once | INTRAVENOUS | Status: AC | PRN
  Administered 2022-03-11: 75 mL via INTRAVENOUS

## 2022-03-11 NOTE — ED Triage Notes (Signed)
Patient arrived by EMS from home with c/o coughing blood since 7am. Denies pain. Takes plavix daily  129/87 blood pressure 67HR 98.8oral 100% RA

## 2022-03-11 NOTE — Telephone Encounter (Signed)
Pt has been admitted to the ED

## 2022-03-11 NOTE — ED Triage Notes (Signed)
In addition to first nurse note pt is c/o dizziness, "periods of staring", and chest pain.

## 2022-03-11 NOTE — ED Provider Triage Note (Signed)
Emergency Medicine Provider Triage Evaluation Note  [name] , a 61 y.o. female  was evaluated in triage.  Pt complains of coughing up blood, dizziness, family member states she stopped in the grocery store and just did there and stared for a few minutes and then came back to.  Does also have blurred vision.  Also complains of chest pain.  Review of Systems  Positive: See above Negative: Chills  Physical Exam  There were no vitals taken for this visit. Gen:   Awake, no distress   Resp:  Normal effort  MSK:   Moves extremities without difficulty  Other:    Medical Decision Making  Medically screening exam initiated at 1:20 PM.  Appropriate orders placed.  Zachary George was informed that the remainder of the evaluation will be completed by another provider, this initial triage assessment does not replace that evaluation, and the importance of remaining in the ED until their evaluation is complete.  Nursing staff instructed chest pain protocols.  I will put in a CT of the head due to dizziness and altered mental status on Saturday   Versie Starks, PA-C 03/11/22 1321

## 2022-03-11 NOTE — Discharge Instructions (Addendum)
Your blood work and CAT scan of your chest were all reassuring.  There were no blood clots in the lungs no evidence of pneumonia or masses.  Follow-up with your cardiologist in regards to your ongoing chest pain.  If you have chest pain that returns and is not improving or worsening in someway please return to emergency department.

## 2022-03-11 NOTE — Telephone Encounter (Signed)
Transferred patient to Access Nurse. The patient called stating that she has been spitting up blood and blood clots since this morning.

## 2022-03-11 NOTE — ED Provider Notes (Signed)
[facility] Provider Note    Event Date/Time   First MD Initiated Contact with Patient 03/11/22 1538     (approximate)   History   Hemoptysis   HPI  [name] is a 61 y.o. female  with pmh CABG, COPD, who presents with hemoptysis.  Patient woke up this morning and coughed.  She felt a sputum in the back of her throat but decided she did not want to swallow or spit it out.  He then woke up several hours later and coughed up the sputum and there was some blood in it.  She had 1 additional episode today with some blood-streaked sputum.  She has had some shortness of breath over the last week also had a sinus infection recently and feels somewhat congested.  She has intermittent chest pain going on for several weeks and then today comes and goes is not clearly exertional and feels pressure-like and is not severe.  She is not having chest pain currently.  Denies other bleeding including hematemesis or blood in her stool.  She is on Xarelto for unclear reasons she does not know why has been compliant with this though.  Tells me she supposed to have a stress test via Dr. [name].  Patient's family number who is at bedside also notes that over the weekend several days ago they were in the store when patient suddenly seemed to stare off and then eyes closed and she seemed like she was going to pass out.  Did not lose postural tone.  Patient says she remembers everything going dark did not have any chest pain or palpitations before this.  Has not had any recurrent episodes.    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 being followed by neurosurgery/eye doctor and pcp. monitoring size.causes sinus problems   Bronchitis    Cardiac arrhythmia    Cervical neck pain with evidence of disc disease     C5/6 disease, MRI done late 2012 - no records available   Chronic combined systolic and diastolic CHF (congestive heart failure) ([place])    a. 08/2010 Echo: mildly reduced EF 40-45%, mild diffuse hypokinesis; b. 06/2016 Echo: EF 50%, no rwma, mild to mod TR; c. 03/2017 Echo: EF 40-45%, Gr1 DD (prior echo reviewed and EF felt to be lower than reported).   Chronic pain    Cocaine abuse, in remission (HCC)    clean x 24 years   Colon polyps    COPD (chronic obstructive pulmonary disease) ([place])    a. 12/2018 PFT: No obvious obst/restrictive dzs.   Coronary artery disease    a. PCI of LCX 2003; b. PCI of the LAD 2012 with a (2.5 x 8 mm BMS);  c.s/p CABG 4/12: L-LAD, VG-Dx, VG-OM, VG-RCA (Dr. Prescott Gum);  d. 01/2012 MV: inf infarct, attenuation, no ischemia; e. 02/2017 MV: signifi attenuation artifact. Fixed basal antlat/inflat scar vs artifact. Reversible apical lat and mid antlat defect - ? atten vs ischemia. F/u echo w/o wma->Med rx.   Depression    Diabetes mellitus without complication ([place])    Family history of colon cancer    Generalized headaches    frequent   GERD (gastroesophageal reflux disease)    Headache  Heart murmur    Hematochezia    Hepatitis    history of hepatitis b   History of cervical cancer    s/p cryotherapy   History of drug abuse ([place])    cocaine, marijuana, clean since 1989   History of hepatitis B    from eating undercooked liver   History of MI (myocardial infarction)    Hyperlipidemia    Hypertension    Ischemic cardiomyopathy    a. 03/2017 Echo: EF 40-45%, Gr1 DD.   Myocardial infarction ([place]) 2003, 2012   PAD (peripheral artery disease) ([place])    a. s/p Right SFA atherectomy and PTA 01/15/11; b. 07/2018 ABI: R 1.02, L 1.09.   Pituitary mass ([place])    a. 12/2018 MRI Brain: Stable pituitary mass w/ 60m area of necrosis. Mass abuts R cavernous sinus w/o definite invasion.   Polyp of colon    Seasonal allergies    Sleep apnea    uses cpap   Smoking history     quit 07/2010   Thyroid disease    Urinary incontinence     Patient Active Problem List   Diagnosis Date Noted   PND (post-nasal drip) 07/24/2021   Chronic pain of inguinal region 07/24/2021   Thrush 04/19/2021   Skin lesion 04/19/2021   Vaginal itching 04/19/2021   Vitamin D deficiency 01/14/2021   Breast mass, right 01/14/2021   Annual physical exam 01/14/2021   Pruritus of both eyes 09/21/2020   Lipoma of right lower extremity 08/30/2020   Hypertension associated with diabetes ([place] 07/24/2020   Abnormality of both breasts on screening mammogram 05/14/2020   Bilateral hip pain 04/19/2020   Insomnia 04/19/2020   Chronic low back pain 02/22/2020   Internal hemorrhoids 02/22/2020   Benign breast cyst in female, right 02/22/2020   OSA on CPAP 12/15/2019   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[place] 12/02/2019   Sensorimotor neuropathy 12/02/2019   Irritable bowel syndrome with both constipation and diarrhea 10/13/2019   Mild intermittent asthma 10/13/2019   PUD (peptic ulcer disease)    Status post total knee replacement using cement, right 04/14/2019   Overactive bladder 03/10/2019   Mild persistent asthma 11/29/2018   DDD (degenerative disc disease), cervical 11/26/2018   Chronic constipation 11/26/2018   Fatty liver 11/26/2018   Adrenal adenoma, left 11/26/2018   Allergic rhinitis 08/26/2018   Bunion of right foot 08/26/2018   Fall 05/14/2018   Anxiety and depression 04/05/2018   Cervical radiculopathy 02/24/2018   Numbness and tingling in right hand 02/24/2018   Chronic neck pain 12/18/2017   Type 2 diabetes mellitus without complication, without long-term current use of insulin ([place] 12/18/2017   Urinary incontinence 12/18/2017   Lumbar radiculopathy 12/18/2017   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   Abnormal feces    Benign neoplasm of ascending colon    Intractable vomiting with nausea    Acute esophagogastric ulcer    Gastritis without bleeding    Vasomotor flushing  11/24/2016   Morbid obesity ([place] 10/29/2015   Back ache 06/19/2015   Colon polyp 06/19/2015   CCF (congestive cardiac failure) ([place] 06/19/2015   Family history of colon cancer 06/19/2015   Orthostatic hypotension 04/30/2015   Hematochezia 01/11/2015   History of colonic polyps 01/11/2015   Atypical chest pain 01/20/2014   COPD with chronic bronchitis and emphysema ([place] 01/03/2013   Right knee pain 12/20/2011   HTN (hypertension) 063/87/5643  Systolic CHF, chronic (HCC)    History of cervical cancer    Depression  GERD (gastroesophageal reflux disease)    Seasonal allergies    History of drug abuse ([place])    PVD (peripheral vascular disease) ([place]) 02/03/2011   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   Smoking history 12/16/2010   Hyperlipidemia 08/23/2010   Coronary artery disease involving native coronary artery of native heart without angina pectoris 08/23/2010   CLAUDICATION 08/23/2010   CAROTID BRUIT 08/23/2010   SHORTNESS OF BREATH 08/23/2010     Physical Exam  Triage Vital Signs: ED Triage Vitals [03/11/22 1320]  Enc Vitals Group     BP 112/73     Pulse Rate 70     Resp 18     Temp 98 F (36.7 C)     Temp Source Oral     SpO2 100 %     Weight      Height      Head Circumference      Peak Flow      Pain Score      Pain Loc      Pain Edu?      Excl. in [place]?     Most recent vital signs: Vitals:   03/11/22 1320 03/11/22 1710  BP: 112/73 127/86  Pulse: 70 65  Resp: 18 20  Temp: 98 F (36.7 C) 98.7 F (37.1 C)  SpO2: 100% 98%     General: Awake, no distress.  CV:  Good peripheral perfusion. No edema Resp:  Normal effort. Lungs are clear Abd:  No distention.  Neuro:             Awake, Alert, Oriented x 3  Other:     ED Results / Procedures / Treatments  Labs (all labs ordered are listed, but only abnormal results are displayed) Labs Reviewed  BASIC METABOLIC PANEL - Abnormal; Notable for the following components:      Result Value   Chloride 112 (*)     Glucose, Bld 109 (*)    Creatinine, Ser 1.04 (*)    All other components within normal limits  CBC - Abnormal; Notable for the following components:   Hemoglobin 15.5 (*)    HCT 48.5 (*)    All other components within normal limits  TROPONIN I (HIGH SENSITIVITY)  TROPONIN I (HIGH SENSITIVITY)     EKG  EKG reviewed by myself shows normal sinus rhythm normal axis T wave inversions in inferior leads and throughout the precordium   RADIOLOGY I reviewed and interpreted the CXR which does not show any acute cardiopulmonary process    PROCEDURES:  Critical Care performed: No  Procedures  The patient is on the cardiac monitor to evaluate for evidence of arrhythmia and/or significant heart rate changes.   MEDICATIONS ORDERED IN ED: Medications  iohexol (OMNIPAQUE) 350 MG/ML injection 75 mL (75 mLs Intravenous Contrast Given 03/11/22 1808)     IMPRESSION / MDM / ASSESSMENT AND PLAN / ED COURSE  I reviewed the triage vital signs and the nursing notes.                              Patient's presentation is most consistent with acute presentation with potential threat to life or bodily function.  Differential diagnosis includes, but is not limited to, bronchitis, pneumonia, malignancy, pulmonary embolism  61 year old female presents with 2 episodes of small-volume hemoptysis.  She shows me pictures and it looks like there is blood-tinged sputum.  She is mildly short of breath has had ongoing  intermittent chest pain none today this is known by her cardiologist Dr. Colon she is supposed to have an outpatient stress test.  She has had some recent sinus congestion as well.  Vitals are reassuring she is satting 98% on room air not tachycardic.  She looks well with clear lung sounds.  EKG does have T wave inversions in the inferior and precordium some of which are new and some of which have been present on prior EKG.  Patient's troponins x2 are negative and she is having no chest pain  currently so do not feel that she needs further work-up for this today.  Did obtain CTA to rule out pulmonary embolism assess for any underlying lung mass and this is negative.  My suspicion is that this is due to underlying bronchitis.  In regards to this episode of decreased responsiveness in the store over the weekend my suspicion is that this was presyncope versus seizure.  CT head was obtained is negative.  Given patient is not symptomatic now I think she is appropriate for discharge with outpatient follow-up.     FINAL CLINICAL IMPRESSION(S) / ED DIAGNOSES   Final diagnoses:  Hemoptysis     Rx / DC Orders   ED Discharge Orders     None        Note:  This document was prepared using Dragon voice recognition software and may include unintentional dictation errors.   Rada Hay, MD 03/11/22 [phone]

## 2022-03-11 NOTE — ED Notes (Signed)
Pt brought back to rm chair via wc. Pt placed on cardiac monitor. Call light within reach. Pt has no further needs at this time. Family at bedside.

## 2022-03-11 NOTE — ED Notes (Signed)
Pt reports coughing up blood earlier.  None noted at this time.

## 2022-03-12 ENCOUNTER — Ambulatory Visit (INDEPENDENT_AMBULATORY_CARE_PROVIDER_SITE_OTHER): Payer: Medicare HMO | Admitting: Family

## 2022-03-12 ENCOUNTER — Encounter: Payer: Self-pay | Admitting: Family

## 2022-03-12 VITALS — BP 118/70 | HR 88 | Temp 98.4°F | Ht 65.0 in | Wt 205.8 lb

## 2022-03-12 DIAGNOSIS — J441 Chronic obstructive pulmonary disease with (acute) exacerbation: Secondary | ICD-10-CM | POA: Diagnosis not present

## 2022-03-12 HISTORY — DX: Chronic obstructive pulmonary disease with (acute) exacerbation: J44.1

## 2022-03-12 MED ORDER — AMOXICILLIN-POT CLAVULANATE 875-125 MG PO TABS: 1.0000 | ORAL_TABLET | Freq: Two times a day (BID) | ORAL | 0 refills | Status: DC

## 2022-03-12 NOTE — Progress Notes (Signed)
Subjective:    Patient ID: [name], female    DOB: 10/15/1960, 61 y.o.   MRN: [id number]  CC: [name] is a 61 y.o. female who presents today for an acute visit.    HPI: Accompanied by sister today  Cough x 7 days, which was off and on an then progressively became more consistent occurring at night. Yesterday morning, she coughed up sputum with blood.      She has had nose bleed and continues to cough up blood. She has coughed up blood  last night the size of a quarter.   Productive cough is worse at night.  No CP.She has felt SOB when 'rushes to fast' amd when in the hot outdoors.   No formal exercise.   No vomiting, melana, coffee ground emesis .  She reports h/o asthma. She hasnt wearing cipap.   She was seen yesterday in emergency room for hemoptysis, CP, sob, and questionable near syncopal episode.    She is compliant with trilogy. She hasnt used albuterol Also using Xyzal, Singulair, Flonase  She is taking Xarelto 2.'5mg'$  BID  History of CABG, COPD.  Endorse shortness of breath over the last week and a recent sinus infection.  Pending stress test and has follow up with cardiology 03/18/22.    Troponins x2 negative in ED.   Saw Dr Halford Chessman 12/2021 Dr Halford Chessman gave her azithromycin.     CTA chest heart is mildly enlarged.  No evidence of pulmonary embolism.  No enlarged mediastinal or hilar lymph nodes.  Small amount atelectasis in the right lower lobe. CT head shows stable pituitary microadenoma as noted on prior MRI.  History of pituitary mass and following with Dr. Deetta Perla, neurosurgery.  Last seen 03/04/2022  She follows with Dr. Halford Chessman, pulmonology.  Upcoming appointment 03/27/2022  Quit smoking 2012  She was last seen by Dr. [name] 09/06/2021 for CAD/CVA BG follow-up.  Echocardiogram and stress test was ordered.  Patient can take penicillin. She itches but no rash, sob. 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 being followed by neurosurgery/eye doctor and pcp. monitoring size.causes sinus problems   Bronchitis    Cardiac arrhythmia    Cervical neck pain with evidence of disc disease    C5/6 disease, MRI done late 2012 - no records available   Chronic combined systolic and diastolic CHF (congestive heart failure) ([place])    a. 08/2010 Echo: mildly reduced EF 40-45%, mild diffuse hypokinesis; b. 06/2016 Echo: EF 50%, no rwma, mild to mod TR; c. 03/2017 Echo: EF 40-45%, Gr1 DD (prior echo reviewed and EF felt to be lower than reported).   Chronic pain    Cocaine abuse, in remission (HCC)    clean x 24 years   Colon polyps    COPD (chronic obstructive pulmonary disease) ([place])    a. 12/2018 PFT: No obvious obst/restrictive dzs.   Coronary artery disease    a. PCI of LCX 2003; b. PCI of the LAD 2012 with a (2.5 x 8 mm BMS);  c.s/p CABG 4/12: L-LAD, VG-Dx, VG-OM, VG-RCA (Dr. Prescott Gum);  d. 01/2012 MV: inf infarct, attenuation, no ischemia; e. 02/2017 MV: signifi attenuation artifact. Fixed basal antlat/inflat scar vs artifact. Reversible apical  lat and mid antlat defect - ? atten vs ischemia. F/u echo w/o wma->Med rx.   Depression    Diabetes mellitus without complication ([place])    Family history of colon cancer    Generalized headaches    frequent   GERD (gastroesophageal reflux disease)    Headache    Heart murmur    Hematochezia    Hepatitis    history of hepatitis b   History of cervical cancer    s/p cryotherapy   History of drug abuse ([place])    cocaine, marijuana, clean since 1989   History of hepatitis B    from eating undercooked liver   History of MI (myocardial infarction)    Hyperlipidemia    Hypertension    Ischemic cardiomyopathy    a. 03/2017 Echo: EF 40-45%, Gr1 DD.   Myocardial infarction ([place]) 2003, 2012   PAD (peripheral  artery disease) ([place])    a. s/p Right SFA atherectomy and PTA 01/15/11; b. 07/2018 ABI: R 1.02, L 1.09.   Pituitary mass ([place])    a. 12/2018 MRI Brain: Stable pituitary mass w/ 83m area of necrosis. Mass abuts R cavernous sinus w/o definite invasion.   Polyp of colon    Seasonal allergies    Sleep apnea    uses cpap   Smoking history    quit 07/2010   Thyroid disease    Urinary incontinence    Past Surgical History:  Procedure Laterality Date   ABDOMINAL HYSTERECTOMY     2003   CHOLECYSTECTOMY  1986   COLONOSCOPY  2008   3 polyps   COLONOSCOPY WITH PROPOFOL N/A 02/06/2015   Procedure: COLONOSCOPY WITH PROPOFOL;  Surgeon: DLucilla Lame MD;  Location: [facility] ENDOSCOPY;  Service: Endoscopy;  Laterality: N/A;   COLONOSCOPY WITH PROPOFOL N/A 01/27/2017   Procedure: COLONOSCOPY WITH PROPOFOL;  Surgeon: WLucilla Lame MD;  [facility];  Service: Endoscopy;  Laterality: N/A;   CORONARY ANGIOPLASTY     w/ stent placement x2   CORONARY ARTERY BYPASS GRAFT  2012   Dr GRockey Situ  CORONARY STENT PLACEMENT  2003   S/P MI   CORONARY STENT PLACEMENT  2007   [place]   ESOPHAGOGASTRODUODENOSCOPY (EGD) WITH PROPOFOL N/A 02/06/2015   Procedure: ESOPHAGOGASTRODUODENOSCOPY (EGD) WITH PROPOFOL;  Surgeon: DLucilla Lame MD;  Location: [facility] ENDOSCOPY;  Service: Endoscopy;  Laterality: N/A;   ESOPHAGOGASTRODUODENOSCOPY (EGD) WITH PROPOFOL N/A 01/27/2017   Procedure: ESOPHAGOGASTRODUODENOSCOPY (EGD) WITH PROPOFOL;  Surgeon: WLucilla Lame MD;  Location: [facility] ENDOSCOPY;  Service: Endoscopy;  Laterality: N/A;   ESOPHAGOGASTRODUODENOSCOPY (EGD) WITH PROPOFOL N/A 09/01/2019   Procedure: ESOPHAGOGASTRODUODENOSCOPY (EGD) WITH PROPOFOL;  Surgeon: VLin Landsman MD;  [place]  Service: Gastroenterology;  Laterality: N/A;   FEMORAL ARTERY STENT  10/2010   right sided (Dr. CBurt Knack   KNEE ARTHROSCOPY WITH MEDIAL MENISECTOMY Right 08/20/2017   Procedure: KNEE ARTHROSCOPY WITH MEDIAL  AND LATERAL  MENISECTOMY;  Surgeon: MHessie Knows MD;  Location: [facility] OR[facility];  Service: Orthopedics;  Laterality: Right;   POSTERIOR CERVICAL LAMINECTOMY N/A 03/08/2018   Procedure: POSTERIOR CERVICAL LAMINECTOMY-C7;  Surgeon: CDeetta Perla MD;  Location: [facility] ORS;  Service: Neurosurgery;  Laterality: N/A;   TONSILLECTOMY     TOTAL KNEE ARTHROPLASTY Right 04/14/2019   Procedure: RIGHT TOTAL KNEE ARTHROPLASTY;  Surgeon: MHessie Knows MD;  Location: [facility] OR[facility];  Service: Orthopedics;  Laterality: Right;   TUBAL LIGATION     Family History  Problem Relation Age of Onset   Hypertension Father  Heart failure Father    Diabetes Father    Colon cancer Father 74   Glaucoma Father    Cancer Father        colorectal    Heart disease Father    Other Father        glaucoma   Breast cancer Mother 71       breast cancer, late 69's   Cancer Mother        breast   Brain cancer Sister    Arthritis Sister    Diabetes Sister    Hypertension Sister    Kidney disease Sister    Diabetes Brother    Hypertension Brother    Other Sister        brain tumor    Acute myelogenous leukemia Grandson        06/2019   Coronary artery disease Neg Hx    Stroke Neg Hx     Allergies: Latex, Shellfish allergy, Sulfa antibiotics, Sulfonamide derivatives, Watermelon [citrullus vulgaris], Penicillins, Soy allergy, Tomato, and Other Current Outpatient Medications on File Prior to Visit  Medication Sig Dispense Refill   albuterol (VENTOLIN HFA) 108 (90 Base) MCG/ACT inhaler Inhale 2 puffs into the lungs every 6 (six) hours as needed for wheezing or shortness of breath. 1 g 12   aspirin EC 81 MG tablet Take 1 tablet (81 mg total) by mouth daily. 90 tablet 3   Azelastine HCl 0.15 % SOLN Place 2 sprays into both nostrils daily as needed (allergies). 30 mL 11   benzonatate (TESSALON) 100 MG capsule Take 1 capsule (100 mg total) by mouth 3 (three) times daily as needed for cough. 60 capsule 0   clobetasol cream (TEMOVATE) 2.29 %  Apply 1 application. topically 2 (two) times daily. Prn left elbow 30 g 0   Cyanocobalamin (VITAMIN B-12) 1000 MCG SUBL Place 0.001 tablets (1 mcg total) under the tongue daily. 90 tablet 3   cyclobenzaprine (FLEXERIL) 5 MG tablet Take 1-2 tablets (5-10 mg total) by mouth daily as needed for muscle spasms. 60 tablet 11   dapagliflozin propanediol (FARXIGA) 10 MG TABS tablet Take 1 tablet (10 mg total) by mouth daily before breakfast. 90 tablet 3   dicyclomine (BENTYL) 10 MG capsule Take 1 capsule (10 mg total) by mouth 3 (three) times daily before meals. As needed for abdominal pain 120 capsule 11   esomeprazole (NEXIUM) 40 MG capsule Take 1 capsule (40 mg total) by mouth daily. 90 capsule 3   Evolocumab (REPATHA SURECLICK) 798 MG/ML SOAJ INJECT '140MG'$  INTO THE SKIN ONCE EVERY 14 DAYS 2 mL 3   ezetimibe (ZETIA) 10 MG tablet Take 1 tablet (10 mg total) by mouth daily. 90 tablet 3   Fluticasone-Umeclidin-Vilant (TRELEGY ELLIPTA) 100-62.5-25 MCG/ACT AEPB Inhale 1 puff into the lungs daily. Rinse mouth 1 each 11   furosemide (LASIX) 20 MG tablet Qd and 2nd tablet prn for edema 180 tablet 3   hydrOXYzine (ATARAX) 25 MG tablet TAKE 1 TO 2 TABLETS BY MOUTH ONCE DAILY AS NEEDED 60 tablet 11   ipratropium (ATROVENT) 0.06 % nasal spray Place 2 sprays into both nostrils 4 (four) times daily. 15 mL 12   ipratropium-albuterol (DUONEB) 0.5-2.5 (3) MG/3ML SOLN USE 1 AMPULE IN NEBULIZER TWICE DAILY AS NEEDED FOR WHEEZING OR SHORTNESS OF BREATH 360 mL 0   isosorbide mononitrate (IMDUR) 30 MG 24 hr tablet Take 1 tablet by mouth twice daily 180 tablet 2   levocetirizine (XYZAL) 5 MG tablet Take 1 tablet (5  mg total) by mouth at bedtime as needed for allergies. 90 tablet 3   linaclotide (LINZESS) 72 MCG capsule TAKE 1 CAPSULE BY MOUTH ONCE DAILY BEFORE BREAKFAST 90 capsule 3   losartan (COZAAR) 25 MG tablet Take 0.5 tablets (12.5 mg total) by mouth daily. 45 tablet 3   meclizine (ANTIVERT) 25 MG tablet TAKE 1 TABLET BY  MOUTH TWICE DAILY AS NEEDED FOR DIZZINESS 60 tablet 11   metoprolol succinate (TOPROL-XL) 50 MG 24 hr tablet TAKE 1 TABLET BY MOUTH ONCE DAILY WITH OR IMMEDIATELY FOLLOWING A MEAL 90 tablet 0   montelukast (SINGULAIR) 10 MG tablet Take 1 tablet (10 mg total) by mouth at bedtime. 90 tablet 3   nitroGLYCERIN (NITROSTAT) 0.4 MG SL tablet DISSOLVE ONE TABLET UNDER THE TONGUE EVERY 5 MINUTES AS NEEDED FOR CHEST PAIN.  DO NOT EXCEED A TOTAL OF 3 DOSES IN 15 MINUTES 25 tablet 2   nystatin (MYCOSTATIN) 100000 UNIT/ML suspension Take 5 mLs (500,000 Units total) by mouth 4 (four) times daily. (Patient taking differently: Take 5 mLs by mouth as needed.) 240 mL 0   olopatadine (PATANOL) 0.1 % ophthalmic solution Place 1 drop into both eyes 2 (two) times daily. 15 mL 11   ondansetron (ZOFRAN) 4 MG tablet Take 1 tablet (4 mg total) by mouth every 8 (eight) hours as needed for nausea or vomiting. 20 tablet 5   OneTouch Delica Lancets 16X MISC USE TO CHECK GLUCOSE IN THE MORNING AND AT BEDTIME 200 each 0   ONETOUCH VERIO test strip USE 1 STRIP TO CHECK GLUCOSE TWICE DAILY ONCE  IN  THE  MORNING  AND  ONCE  AT  BEDTIME 200 each 0   PARoxetine (PAXIL) 20 MG tablet Take 1 tablet by mouth once daily 90 tablet 0   potassium chloride (KLOR-CON M) 10 MEQ tablet Take 1 tablet (10 mEq total) by mouth daily. 90 tablet 3   pregabalin (LYRICA) 75 MG capsule Take 1 capsule by mouth twice daily 60 capsule 5   rivaroxaban (XARELTO) 2.5 MG TABS tablet Take 1 tablet by mouth twice daily 180 tablet 1   sodium chloride (OCEAN) 0.65 % SOLN nasal spray Place 2 sprays into both nostrils daily as needed for congestion. 30 mL 11   traZODone (DESYREL) 50 MG tablet Take 0.5-1 tablets (25-50 mg total) by mouth at bedtime as needed for sleep. 90 tablet 3   Vibegron (GEMTESA) 75 MG TABS Take 75 mg by mouth daily. D/c myrbetriq 90 tablet 3   No current facility-administered medications on file prior to visit.    Social History   Tobacco  Use   Smoking status: Former    Packs/day: 2.00    Years: 38.00    Total pack years: 76.00    Types: Cigarettes    Quit date: 08/12/2010    Years since quitting: 11.6   Smokeless tobacco: Never  Vaping Use   Vaping Use: Never used  Substance Use Topics   Alcohol use: No   Drug use: No    Types: Cocaine    Comment: Remote Hx (crack cocaine and marijuana).none since 34yr plus    Review of Systems  Constitutional:  Negative for chills and fever.  HENT:  Positive for congestion and nosebleeds.   Respiratory:  Positive for cough and shortness of breath. Negative for wheezing.   Cardiovascular:  Negative for chest pain and palpitations.  Gastrointestinal:  Negative for blood in stool, nausea and vomiting.      Objective:  BP 118/70   Pulse 88   Temp 98.4 F (36.9 C)   Ht '5\' 5"'$  (1.651 m)   Wt 205 lb 12.8 oz (93.4 kg)   SpO2 99%   BMI 34.25 kg/m    Physical Exam Vitals reviewed.  Constitutional:      Appearance: She is well-developed.  HENT:     Head: Normocephalic and atraumatic.     Right Ear: Hearing, tympanic membrane, ear canal and external ear normal. No decreased hearing noted. No drainage, swelling or tenderness. No middle ear effusion. No foreign body. Tympanic membrane is not erythematous or bulging.     Left Ear: Hearing, tympanic membrane, ear canal and external ear normal. No decreased hearing noted. No drainage, swelling or tenderness.  No middle ear effusion. No foreign body. Tympanic membrane is not erythematous or bulging.     Nose: Nose normal. No rhinorrhea.     Right Sinus: No maxillary sinus tenderness or frontal sinus tenderness.     Left Sinus: No maxillary sinus tenderness or frontal sinus tenderness.     Mouth/Throat:     Pharynx: Uvula midline. No oropharyngeal exudate or posterior oropharyngeal erythema.     Tonsils: No tonsillar abscesses.  Eyes:     Conjunctiva/sclera: Conjunctivae normal.  Cardiovascular:     Rate and Rhythm: Regular  rhythm.     Pulses: Normal pulses.     Heart sounds: Normal heart sounds.  Pulmonary:     Effort: Pulmonary effort is normal.     Breath sounds: Normal breath sounds. No wheezing, rhonchi or rales.  Lymphadenopathy:     Head:     Right side of head: No submental, submandibular, tonsillar, preauricular, posterior auricular or occipital adenopathy.     Left side of head: No submental, submandibular, tonsillar, preauricular, posterior auricular or occipital adenopathy.     Cervical: No cervical adenopathy.  Skin:    General: Skin is warm and dry.  Neurological:     Mental Status: She is alert.  Psychiatric:        Speech: Speech normal.        Behavior: Behavior normal.        Thought Content: Thought content normal.        Assessment & Plan:   Problem List Items Addressed This Visit       Respiratory   COPD exacerbation ([place]) - Primary    Reviewed emergency room visit with patient and sister today at length.  Patient's primary concern is that she left the emergency room without an antibiotic.  She is concerned for bacterial infection.  She has increased sputum and shortness of breath with a longstanding history of smoking.  Discussed with her likely COPD exacerbation.  Agreed to start Augmentin.  Advised to stop Flonase as think this may be worsening nasal dryness.  Counseled her that she is on an anticoagulant as managed by cardiologist and likely reason for blood in her sputum.  Also encouraged a coolmist humidifier at her bedside.  She will start probiotics.  She will keep scheduled follow-up with cardiology as she is due for ischemic evaluation ( stress test). I  have given her strict return precautions if she were to have chest pain or increased shortness of breath to call 911 or to return to emergency room.  She verbalized understanding of all      Relevant Medications   amoxicillin-clavulanate (AUGMENTIN) 875-125 MG tablet      I have discontinued Valrie Candelas's  fluticasone. I am also  having her start on amoxicillin-clavulanate. Additionally, I am having her maintain her ondansetron, nystatin, benzonatate, OneTouch Verio, OneTouch Delica Lancets 32P, Xarelto, Gemtesa, traZODone, sodium chloride, potassium chloride, olopatadine, montelukast, nitroGLYCERIN, meclizine, losartan, linaclotide, levocetirizine, ipratropium, hydrOXYzine, ezetimibe, esomeprazole, dicyclomine, dapagliflozin propanediol, cyclobenzaprine, Vitamin B-12, Azelastine HCl, albuterol, aspirin EC, furosemide, Trelegy Ellipta, clobetasol cream, PARoxetine, Repatha SureClick, metoprolol succinate, pregabalin, isosorbide mononitrate, and ipratropium-albuterol.   Meds ordered this encounter  Medications   amoxicillin-clavulanate (AUGMENTIN) 875-125 MG tablet    Sig: Take 1 tablet by mouth 2 (two) times daily for 7 days.    Dispense:  14 tablet    Refill:  0    Order Specific Question:   Supervising Provider    Answer:   Crecencio Mc [[id number]]    Return precautions given.   Risks, benefits, and alternatives of the medications and treatment plan prescribed today were discussed, and patient expressed understanding.   Education regarding symptom management and diagnosis given to patient on AVS.  Continue to follow with [name], [name], MD for routine health maintenance.   [name] and I agreed with plan.   Mable Paris, FNP

## 2022-03-12 NOTE — Telephone Encounter (Addendum)
Patient seen at [facility] ED on 03/11/2022 DX with Bronchitis per patient was given no meds just home care patient advised she has spit up bright red blood X 2 quarter in size.Scheduled with Ar[place] at 11:15

## 2022-03-12 NOTE — Patient Instructions (Addendum)
Stop flonase for now as may be drying nasal passages.   Cool mist humidifier.    Start Augmentin  Ensure to take probiotics while on antibiotics and also for 2 weeks after completion. This can either be by eating yogurt daily or taking a probiotic supplement over the counter such as Culturelle.It is important to re-colonize the gut with good bacteria and also to prevent any diarrheal infections associated with antibiotic use.    Let me know how you are doing.  If any recurrence of chest pain or bleeding from nose or mouth in larger quantities would warrant a call to 911 or return to the emergency room

## 2022-03-13 ENCOUNTER — Ambulatory Visit: Payer: Medicare HMO

## 2022-03-13 DIAGNOSIS — R278 Other lack of coordination: Secondary | ICD-10-CM

## 2022-03-13 DIAGNOSIS — M6281 Muscle weakness (generalized): Secondary | ICD-10-CM

## 2022-03-13 DIAGNOSIS — M6289 Other specified disorders of muscle: Secondary | ICD-10-CM

## 2022-03-13 NOTE — Therapy (Signed)
OUTPATIENT PHYSICAL THERAPY FEMALE PELVIC TREATMENT DEFERRED   Patient Name: [name] MRN: [id number] DOB:01-26-1961, 61 y.o., female Today's Date: 03/13/2022   [name] End of Session - 03/13/22 1457     Visit Number -   Number of Visits 12    Date for [name] Re-Evaluation 04/17/22    Authorization Type IE: 01/23/22    [name] Start Time 1315    [name] Stop Time 1350    [name] Time Calculation (min) 35 min    Activity Tolerance Patient tolerated treatment well              Past Medical History:  Diagnosis Date   Allergy    Anemia    Arthritis    Asthma    Bacterial vaginitis    Blood in stool    Brachial neuritis or radiculitis NOS    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 being followed by neurosurgery/eye doctor and pcp. monitoring size.causes sinus problems   Bronchitis    Cardiac arrhythmia    Cervical neck pain with evidence of disc disease    C5/6 disease, MRI done late 2012 - no records available   Chronic combined systolic and diastolic CHF (congestive heart failure) ([place])    a. 08/2010 Echo: mildly reduced EF 40-45%, mild diffuse hypokinesis; b. 06/2016 Echo: EF 50%, no rwma, mild to mod TR; c. 03/2017 Echo: EF 40-45%, Gr1 DD (prior echo reviewed and EF felt to be lower than reported).   Chronic pain    Cocaine abuse, in remission (HCC)    clean x 24 years   Colon polyps    COPD (chronic obstructive pulmonary disease) ([place])    a. 12/2018 PFT: No obvious obst/restrictive dzs.   Coronary artery disease    a. PCI of LCX 2003; b. PCI of the LAD 2012 with a (2.5 x 8 mm BMS);  c.s/p CABG 4/12: L-LAD, VG-Dx, VG-OM, VG-RCA (Dr. Prescott Gum);  d. 01/2012 MV: inf infarct, attenuation, no ischemia; e. 02/2017 MV: signifi attenuation artifact. Fixed basal antlat/inflat scar vs artifact. Reversible apical lat and mid antlat defect - ? atten vs ischemia. F/u echo w/o wma->Med rx.   Depression    Diabetes mellitus without complication ([place])    Family history of colon cancer     Generalized headaches    frequent   GERD (gastroesophageal reflux disease)    Headache    Heart murmur    Hematochezia    Hepatitis    history of hepatitis b   History of cervical cancer    s/p cryotherapy   History of drug abuse ([place])    cocaine, marijuana, clean since 1989   History of hepatitis B    from eating undercooked liver   History of MI (myocardial infarction)    Hyperlipidemia    Hypertension    Ischemic cardiomyopathy    a. 03/2017 Echo: EF 40-45%, Gr1 DD.   Myocardial infarction ([place]) 2003, 2012   PAD (peripheral artery disease) ([place])    a. s/p Right SFA atherectomy and PTA 01/15/11; b. 07/2018 ABI: R 1.02, L 1.09.   Pituitary mass ([place])    a. 12/2018 MRI Brain: Stable pituitary mass w/ 46m area of necrosis. Mass abuts R cavernous sinus w/o definite invasion.   Polyp of colon    Seasonal allergies    Sleep apnea    uses cpap   Smoking history    quit 07/2010   Thyroid disease    Urinary incontinence  Past Surgical History:  Procedure Laterality Date   ABDOMINAL HYSTERECTOMY     2003   CHOLECYSTECTOMY  1986   COLONOSCOPY  2008   3 polyps   COLONOSCOPY WITH PROPOFOL N/A 02/06/2015   Procedure: COLONOSCOPY WITH PROPOFOL;  Surgeon: Lucilla Lame, MD;  Location: [facility] ENDOSCOPY;  Service: Endoscopy;  Laterality: N/A;   COLONOSCOPY WITH PROPOFOL N/A 01/27/2017   Procedure: COLONOSCOPY WITH PROPOFOL;  Surgeon: Lucilla Lame, MD;  Location: [facility] ENDOSCOPY;  Service: Endoscopy;  Laterality: N/A;   CORONARY ANGIOPLASTY     w/ stent placement x2   CORONARY ARTERY BYPASS GRAFT  2012   Dr [name]   CORONARY STENT PLACEMENT  2003   S/P MI   CORONARY STENT PLACEMENT  2007   [place]   ESOPHAGOGASTRODUODENOSCOPY (EGD) WITH PROPOFOL N/A 02/06/2015   Procedure: ESOPHAGOGASTRODUODENOSCOPY (EGD) WITH PROPOFOL;  Surgeon: Lucilla Lame, MD;  Location: [facility] ENDOSCOPY;  Service: Endoscopy;  Laterality: N/A;   ESOPHAGOGASTRODUODENOSCOPY (EGD) WITH PROPOFOL N/A 01/27/2017   Procedure:  ESOPHAGOGASTRODUODENOSCOPY (EGD) WITH PROPOFOL;  Surgeon: Lucilla Lame, MD;  Location: [facility] ENDOSCOPY;  Service: Endoscopy;  Laterality: N/A;   ESOPHAGOGASTRODUODENOSCOPY (EGD) WITH PROPOFOL N/A 09/01/2019   Procedure: ESOPHAGOGASTRODUODENOSCOPY (EGD) WITH PROPOFOL;  Surgeon: Lin Landsman, MD;  Location: [facility];  Service: Gastroenterology;  Laterality: N/A;   FEMORAL ARTERY STENT  10/2010   right sided (Dr. Burt Knack)   KNEE ARTHROSCOPY WITH MEDIAL MENISECTOMY Right 08/20/2017   Procedure: KNEE ARTHROSCOPY WITH MEDIAL  AND LATERAL MENISECTOMY;  Surgeon: Hessie Knows, MD;  Location: [facility] ORS;  Service: Orthopedics;  Laterality: Right;   POSTERIOR CERVICAL LAMINECTOMY N/A 03/08/2018   Procedure: POSTERIOR CERVICAL LAMINECTOMY-C7;  Surgeon: Deetta Perla, MD;  Location: [facility] ORS;  Service: Neurosurgery;  Laterality: N/A;   TONSILLECTOMY     TOTAL KNEE ARTHROPLASTY Right 04/14/2019   Procedure: RIGHT TOTAL KNEE ARTHROPLASTY;  Surgeon: Hessie Knows, MD;  Location: [facility] ORS;  Service: Orthopedics;  Laterality: Right;   TUBAL LIGATION     Patient Active Problem List   Diagnosis Date Noted   COPD exacerbation ([place]) 03/12/2022   PND (post-nasal drip) 07/24/2021   Chronic pain of inguinal region 07/24/2021   Thrush 04/19/2021   Skin lesion 04/19/2021   Vaginal itching 04/19/2021   Vitamin D deficiency 01/14/2021   Breast mass, right 01/14/2021   Annual physical exam 01/14/2021   Pruritus of both eyes 09/21/2020   Lipoma of right lower extremity 08/30/2020   Hypertension associated with diabetes ([place]) 07/24/2020   Abnormality of both breasts on screening mammogram 05/14/2020   Bilateral hip pain 04/19/2020   Insomnia 04/19/2020   Chronic low back pain 02/22/2020   Internal hemorrhoids 02/22/2020   Benign breast cyst in female, right 02/22/2020   OSA on CPAP 12/15/2019   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[place]) 12/02/2019   Sensorimotor neuropathy 12/02/2019   Irritable bowel syndrome with both  constipation and diarrhea 10/13/2019   Mild intermittent asthma 10/13/2019   PUD (peptic ulcer disease)    Status post total knee replacement using cement, right 04/14/2019   Overactive bladder 03/10/2019   Mild persistent asthma 11/29/2018   DDD (degenerative disc disease), cervical 11/26/2018   Chronic constipation 11/26/2018   Fatty liver 11/26/2018   Adrenal adenoma, left 11/26/2018   Allergic rhinitis 08/26/2018   Bunion of right foot 08/26/2018   Fall 05/14/2018   Anxiety and depression 04/05/2018   Cervical radiculopathy 02/24/2018   Numbness and tingling in right hand 02/24/2018   Chronic neck pain 12/18/2017   Type 2  diabetes mellitus without complication, without long-term current use of insulin ([place]) 12/18/2017   Urinary incontinence 12/18/2017   Lumbar radiculopathy 12/18/2017   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   Abnormal feces    Benign neoplasm of ascending colon    Intractable vomiting with nausea    Acute esophagogastric ulcer    Gastritis without bleeding    Vasomotor flushing 11/24/2016   Morbid obesity ([place]) 10/29/2015   Back ache 06/19/2015   Colon polyp 06/19/2015   CCF (congestive cardiac failure) ([place]) 06/19/2015   Family history of colon cancer 06/19/2015   Orthostatic hypotension 04/30/2015   Hematochezia 01/11/2015   History of colonic polyps 01/11/2015   Atypical chest pain 01/20/2014   COPD with chronic bronchitis and emphysema ([place]) 01/03/2013   Right knee pain 12/20/2011   HTN (hypertension) 09/60/4540   Systolic CHF, chronic (HCC)    History of cervical cancer    Depression    GERD (gastroesophageal reflux disease)    Seasonal allergies    History of drug abuse ([place])    PVD (peripheral vascular disease) ([place]) 02/03/2011   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   Smoking history 12/16/2010   Hyperlipidemia 08/23/2010   Coronary artery disease involving native coronary artery of native heart without angina pectoris 08/23/2010   CLAUDICATION  08/23/2010   CAROTID BRUIT 08/23/2010   SHORTNESS OF BREATH 08/23/2010    PCP: [name], [name], MD   REFERRING PROVIDER: [name], [name], MD   REFERRING DIAG:  N32.81,N81.9 (ICD-10-CM) - Overactive bladder due to prolapse of female genital organ   THERAPY DIAG:  No diagnosis found.  Rationale for Evaluation and Treatment: Rehabilitation  ONSET DATE: A few years now  OCCUPATION/SOCIAL ACTIVITIES: Read, church, shopping, walking   PLOF: Independent  PERTINENT HISTORY/CHART REVIEW: CKD, CHF  Olivia Mackie [name], MD (12/06/21) Plan Overactive bladder due to prolapse of female genital organ - Plan: Ambulatory referral to Physical Therapy, Vibegron (GEMTESA) 75 MG TABS, consider gyn or urogyn in the future if does not improve with [name]   CHIEF CONCERN: [name] reports her kidneys are "so bad" and the urinary leakage has been happening for some years but this year has been the worst of it. [name] tries to perform kegels in order to hold her urine when she has the very strong urge to go to the bathroom (often). [name] is unable to make it to the bathroom without having to crunch over (forward trunk flexion) and squeeze her legs tight in order to not have leakage. However, most of the time the urinary leakage happens. [name] has to plan around where to go in the community and know where the bathroom is at all times. [name] has tried briefs and feels that her urine overflows and she has to constantly change her clothes. [name] has to change her clothes about 4-5x a day because she does not wear a brief. [name] also reports bedwetting occasionally because she cannot make it to the bathroom on time. Most recently, [name] has had bowel leakage with the urinary leakage as well. In some instances, she doesn't even notice the bowel leakage and notices when she wipes as her skin is very irritated and red. In regards to the prolapse, when [name] wipes every time she does feel "something" there but Dr did not tell her  about prolapse. [name] had a bedside commode and used for awhile but is currently not using. [name] is going to her kidney doctor today and stopped taking the OAB medicine given because doctors are trying  to figure out if the OAB medication is interfering with her kidneys/blood platelets.   LIVING ENVIRONMENT: Lives with: lives with their family Lives in: House/apartment   PATIENT GOALS: [name] be able to have control of her bladder, less leakage, not feeling a pressure at the vaginal opening. [name] would like to participate in sexual activity but fears about incontinence.     UROLOGICAL HISTORY Fluid intake: Yes: Water (tries a gallon a day per Dr.), fruit juice (pineapple, mango) , very occasionally a sprite Pain with urination: No Fully empty bladder: Yes Stream: Strong every time  Urgency: Yes:   Nocturia: 3-4x/night Frequency: "over 30x a day"  Leakage: Urge to void, Walking to the bathroom, Laughing, and Bending forward Pads: No because it hasn't helped and they cannot hold all the urine that comes out    GASTROINTESTINAL HISTORY  [name] has hx of constipation and is on medication (Linzess) Pain with bowel movement: No Type of bowel movement ([place] Stool Chart): Type 5 and 7  Fully empty rectum: Yes:   Leakage: Yes: occasionally, but just recently, does not know if that is related to the medication because it is watery , Type 7 on underwear when she notices but not everyday (2x/week) , having to wear 2 underwears in case leakage happens especially when out in the community  Pads: No   SEXUAL HISTORY/FUNCTION Pain with intercourse: No Finger stimulation: no pain, but has leakage and cannot continue  Not engaging in intercourse currently due to incontinence   OBSTETRICAL HISTORY Vaginal deliveries: G4P2 Tearing: Yes: with first child    GYNECOLOGICAL HISTORY Hysterectomy: yes, partial hysterectomy  Pelvic Organ Prolapse: see above in pertinent hx, not graded  Heaviness/pressure:  sometimes    SUBJECTIVE:  [name] with a recent ED visit on 03/11/22 for hemoptysis. [name] did receive care at ED but provided with no medications. [name] still has no central AC in her home and finds the heat also contributes to her fatigue and SOB. [name] had an episode of unresponsiveness at the grocery store before medical appt yesterday. [name] leaned on the cart and could respond to sister although she could hear her sister. [name] reports seeing "black" then "white" but no convulsions or loss of balance. In the ER, [name] given CT scan and pituitary mass still continues to be stable. [name] had a sinus infection, but was told at Dr's visit yesterday that infection worsened into bronchials which is why [name] is coughing up bloody sputum.    PAIN:  Are you having pain? No  OBJECTIVE:   DIAGNOSTIC TESTING AND IMAGING:  FROM ED Visit on 03/11/22 -EKG does have T wave inversions in the inferior and precordium some of which are new and some of which have been present on prior EKG Echocardiogram and stress test is ordered.   -CTA chest heart is mildly enlarged.  No evidence of pulmonary embolism.  No enlarged mediastinal or hilar lymph nodes.  Small amount atelectasis in the right lower lobe.  -CT head shows stable pituitary microadenoma as noted on prior MRI.   COGNITION: Overall cognitive status: Within functional limits for tasks assessed     POSTURE:  Iliac crest height: L iliac crest  Pelvic obliquity: WNL   SENSATION:  Light touch:  L2-L4 intact, L5-S1 impaired, no dx of peripheral neuropathy in feet but has T2DM    LUMBAR AROM/PROM:   AROM (Normal range in degrees) AROM  02/06/22  Flexion (65) WNL  Extension (25-30) WNL  Right lateral flexion (25) WNL  Left lateral flexion (25) WNL*  Right rotation (30) WNL  Left rotation (30) WNL*    (*= pain, Blank rows = not tested)  LOWER EXTREMITY ROM:    PROM (Normal range in degrees) Right 02/06/22 Left 02/06/22  Hip flexion (0-125) WNL WNL  Hip extension (0-15)    Hip abduction (0-40) WNL WNL  Hip adduction    Hip internal rotation (0-45) WNL* WNL*  Hip external rotation (0-45) WNL* WNL*  Knee flexion WNL WNL   (*= pain, Blank rows = not tested)  LOWER EXTREMITY MMT:   MMT Right 02/06/22 Left 02/06/22  Hip flexion 4 3*  Hip extension    Hip abduction    Hip adduction    Hip internal rotation 4 4  Hip external rotation 4 4  Knee flexion 4 3  Knee extension 5 5  Ankle dorsiflexion (in seated) 5 5  Ankle plantarflexion    Ankle inversion    Ankle eversion    (*= pain, Blank rows = not tested)   HIP SPECIAL TESTS:  FADDIR: + B with groin pain  FABER: +B with groin pain    PALPATION: Abdominal:  Diastasis: none  LLQ Tenderness upon superficial palpation, pain increased closer to ASIS  Iliopsoas tender and produced lower abdomen pain with radiation to the groin   RUQ- Scar from gallbladder removal, scar tissue restriction noted along scar   RLQ- Tenderness upon deep palpation, but not as severe as the L  Towards pubic symphysis some discomfort/pain   B adductor musculature tender upon palpation    EXTERNAL PELVIC EXAM: Patient educated on the purpose of the pelvic exam and articulated understanding; patient consented to the exam verbally. Deferred 2/2 time constraints  Breath coordination: present/absent/inconsistent Voluntary Contraction: present/absent Relaxation: full/delayed/non-relaxing Perineal movement with sustained IAP increase ("bear down"): descent/no change/elevation/excessive descent Perineal movement with rapid IAP increase ("cough"): elevation/no change/descent Pubic symphysis: tender upon palpation with heal of hand  (0= no contraction, 1= flicker, 2= weak squeeze, 3= fair squeeze with lift, 4= good squeeze and lift against  resistance, 5= strong squeeze against strong resistance)    TODAY'S TREATMENT DEFERRED  [name] not charged for visit due to feeling sick and recent ED visit on 03/11/22. See subjective and new objective information above.  Patient Education:  Patient provided with when to seek further medical care: increased chest pain at rest, increased SOB at rest, or increased bloody sputum in larger quantities.    ASSESSMENT:  Clinical Impression: Patient presents to clinic feeling weak due to  recent visit to ED and worsening bronchial infection/COPD exacerbation. Despite external stressors and health concerns, [name] verbalizes motivation to continue PFPT. [name] will resume with PFPT, if no new events next week. Educated [name] to monitor her symptoms and will continue to follow-up in future sessions. Plan to talk to inter-disciplinary team if needed. Patient will continue to benefit from skilled therapeutic intervention to address deficits in PFM coordination, PFM strength, PFM endurance, pain, IAP management, LE strength, posture, and sensation in order to increase PLOF and improve overall QOL.    Objective Impairments: decreased activity tolerance, decreased coordination, decreased endurance, decreased strength, increased muscle spasms, improper body mechanics, and pain.   Activity Limitations: lifting, bending, sleeping, bed mobility, continence, toileting, hygiene/grooming, and locomotion level  Personal Factors: Age, Behavior pattern, Past/current experiences, Time since onset of injury/illness/exacerbation, and 3+ comorbidities: CHF, CKD, brain tumor benign, T2DM, HTN  are also affecting patient's functional outcome.   Rehab Potential: Good  Clinical Decision Making: Evolving/moderate complexity  Evaluation Complexity: Moderate   GOALS: Goals reviewed with patient? Yes  SHORT TERM GOALS: Target date: 04/24/2022  Patient will improve scores on FOTO Urinary Problem, Bowel Leakage, and Constipation by >/=7  points in order to demonstrate improved pelvic floor dysfunction with limited disruptions at home and in the community for an improved QOL.  Baseline: 37, 52, 60 Goal status: INITIAL    LONG TERM GOALS: Target date: 06/05/2022   Patient will report confidence in ability to control bladder >/= 5/10 in order to demonstrate improved function and ability to participate more fully in activities at home and in the community. Baseline: 1/10 Goal status: INITIAL  2.  Patient will report decreased instances of urinary leakage over a 3-week period in order to demonstrate improved bladder control and allow for increased participation in activities outside of the home. Baseline: urinary leakage every day (enough to make outer clothes wet)/changing 4-5x a day Goal status: INITIAL  3.  Patient will report decreased instances of bowel leakage over a 3-week period in order to demonstrate improved bladder control and allow for increased participation in activities outside of the home. Baseline: (More than once a week)/up to 2x Goal status: INITIAL  4.  Patient will report being able to return to activities including, but not limited to: walking, church functions, sexual activity without limitation to indicate complete resolution of the chief complaint and return to prior level of participation at home and in the community. Baseline: unable to attend a church service without bowel/bladder leakage/not participating in sexual activity due to leakage Goal status: INITIAL  5.  Patient will report less than 5 incidents of stress urinary incontinence over the course of 3 weeks while laughing/bending/prolonged activity in order to demonstrate improved PFM coordination, strength, and function for improved overall QOL. Baseline: every time she laughs/bends over Goal status: INITIAL  6.  Patient will report decrease urinary leakage during the night in order to demonstrate improved PFM coordination, strength, improved  sleep quality, and overall QOL.  Baseline: wakes up 3-4x/night and leaks every time Goal status: INITIAL  PLAN: [name] Frequency: 1x/week  [name] Duration: 12 weeks  Planned Interventions: Therapeutic exercises, Therapeutic activity, Neuromuscular re-education, Balance training, Gait training, Patient/Family education, Joint mobilization, Spinal mobilization, Moist heat, scar mobilization, Taping, and Manual therapy  Plan For Next Session: PFM external exam, begin deep core, myofascial?   Taiya Nutting, [name], DPT  03/13/2022, 2:58 PM

## 2022-03-16 ENCOUNTER — Other Ambulatory Visit: Payer: Self-pay | Admitting: Internal Medicine

## 2022-03-16 DIAGNOSIS — E785 Hyperlipidemia, unspecified: Secondary | ICD-10-CM

## 2022-03-17 NOTE — Assessment & Plan Note (Addendum)
Reviewed emergency room visit with patient and sister today at length.  Patient's primary concern is that she left the emergency room without an antibiotic.  She is concerned for bacterial infection.  She has increased sputum and shortness of breath with a longstanding history of smoking. Start Augmentin.  Advised to stop Flonase as think this may be worsening nasal dryness.  Counseled her that she is on an anticoagulant as managed by cardiologist and likely reason for blood in her sputum.  Also encouraged a coolmist humidifier at her bedside.  She will start probiotics.  She will keep scheduled follow-up with cardiology as she is due for ischemic evaluation ( stress test). I  have given her strict return precautions if she were to have chest pain or increased shortness of breath to call 911 or to return to emergency room.  She verbalized understanding of all

## 2022-03-18 ENCOUNTER — Telehealth: Payer: Self-pay | Admitting: Internal Medicine

## 2022-03-18 ENCOUNTER — Ambulatory Visit (INDEPENDENT_AMBULATORY_CARE_PROVIDER_SITE_OTHER): Payer: Medicare HMO | Admitting: Cardiology

## 2022-03-18 ENCOUNTER — Encounter: Payer: Self-pay | Admitting: Cardiology

## 2022-03-18 VITALS — BP 110/76 | HR 76 | Ht 65.0 in | Wt 205.0 lb

## 2022-03-18 DIAGNOSIS — E785 Hyperlipidemia, unspecified: Secondary | ICD-10-CM | POA: Diagnosis not present

## 2022-03-18 DIAGNOSIS — G4733 Obstructive sleep apnea (adult) (pediatric): Secondary | ICD-10-CM

## 2022-03-18 DIAGNOSIS — I255 Ischemic cardiomyopathy: Secondary | ICD-10-CM

## 2022-03-18 DIAGNOSIS — I25118 Atherosclerotic heart disease of native coronary artery with other forms of angina pectoris: Secondary | ICD-10-CM | POA: Diagnosis not present

## 2022-03-18 DIAGNOSIS — I739 Peripheral vascular disease, unspecified: Secondary | ICD-10-CM

## 2022-03-18 DIAGNOSIS — Z9989 Dependence on other enabling machines and devices: Secondary | ICD-10-CM

## 2022-03-18 DIAGNOSIS — I10 Essential (primary) hypertension: Secondary | ICD-10-CM | POA: Diagnosis not present

## 2022-03-18 NOTE — Telephone Encounter (Signed)
error 

## 2022-03-18 NOTE — Patient Instructions (Signed)
Medication Instructions:   Your physician recommends that you continue on your current medications as directed. Please refer to the Current Medication list given to you today.  *If you need a refill on your cardiac medications before your next appointment, please call your pharmacy*   Lab Work:  None ordered  Testing/Procedures:  None ordered   Follow-Up: At [facility], you and your health needs are our priority.  As part of our continuing mission to provide you with exceptional heart care, we have created designated Provider Care Teams.  These Care Teams include your primary Cardiologist (physician) and Advanced Practice Providers (APPs -  Physician Assistants and Nurse Practitioners) who all work together to provide you with the care you need, when you need it.  We recommend signing up for the patient portal called "MyChart".  Sign up information is provided on this After Visit Summary.  MyChart is used to connect with patients for Virtual Visits (Telemedicine).  Patients are able to view lab/test results, encounter notes, upcoming appointments, etc.  Non-urgent messages can be sent to your provider as well.   To learn more about what you can do with MyChart, go to [email or web address].    Your next appointment:   2 month(s)  The format for your next appointment:   In Person  Provider:   You may see Ida Rogue, MD or one of the following Advanced Practice Providers on your designated Care Team:   Murray Hodgkins, NP Christell Faith, PA-C Cadence Kathlen Mody, [place]   Important Information About Sugar

## 2022-03-18 NOTE — Telephone Encounter (Signed)
Pt saw Dr Melinda Crutch who asked if pt can get another antibiotic. The amoxicillin that was given to pt was for 7 days.  Please call pt for more information.   Thank you!  Call pt @ [phone]

## 2022-03-18 NOTE — Progress Notes (Signed)
Cardiology Clinic Note   Patient Name: [name] Date of Encounter: 03/18/2022  Primary Care Provider:  [name], [name], MD Primary Cardiologist:  Ida Rogue, MD  Patient Profile    61 year old female with a history of CAD status post CABG, chronic combined systolic diastolic congestive heart failure, ischemic cardiomyopathy, hypertension, hyperlipidemia, diabetes, COPD, peripheral arterial disease, and sleep apnea who presents for follow-up after recent emergency room visit for hemoptysis.   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 being followed by neurosurgery/eye doctor and pcp. monitoring size.causes sinus problems   Bronchitis    Cardiac arrhythmia    Cervical neck pain with evidence of disc disease    C5/6 disease, MRI done late 2012 - no records available   Chronic combined systolic and diastolic CHF (congestive heart failure) ([place])    a. 08/2010 Echo: mildly reduced EF 40-45%, mild diffuse hypokinesis; b. 06/2016 Echo: EF 50%, no rwma, mild to mod TR; c. 03/2017 Echo: EF 40-45%, Gr1 DD (prior echo reviewed and EF felt to be lower than reported).   Chronic pain    Cocaine abuse, in remission (HCC)    clean x 24 years   Colon polyps    COPD (chronic obstructive pulmonary disease) ([place])    a. 12/2018 PFT: No obvious obst/restrictive dzs.   Coronary artery disease    a. PCI of LCX 2003; b. PCI of the LAD 2012 with a (2.5 x 8 mm BMS);  c.s/p CABG 4/12: L-LAD, VG-Dx, VG-OM, VG-RCA (Dr. Prescott Gum);  d. 01/2012 MV: inf infarct, attenuation, no ischemia; e. 02/2017 MV: signifi attenuation artifact. Fixed basal antlat/inflat scar vs artifact. Reversible apical lat and mid antlat defect - ? atten vs ischemia. F/u echo w/o wma->Med rx.   Depression    Diabetes  mellitus without complication ([place])    Family history of colon cancer    Generalized headaches    frequent   GERD (gastroesophageal reflux disease)    Headache    Heart murmur    Hematochezia    Hepatitis    history of hepatitis b   History of cervical cancer    s/p cryotherapy   History of drug abuse ([place])    cocaine, marijuana, clean since 1989   History of hepatitis B    from eating undercooked liver   History of MI (myocardial infarction)    Hyperlipidemia    Hypertension    Ischemic cardiomyopathy    a. 03/2017 Echo: EF 40-45%, Gr1 DD.   Myocardial infarction ([place]) 2003, 2012   PAD (peripheral artery disease) ([place])    a. s/p Right SFA atherectomy and PTA 01/15/11; b. 07/2018 ABI: R 1.02, L 1.09.   Pituitary mass ([place])    a. 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 Mass abuts R cavernous sinus w/o definite invasion.   Polyp of colon    Seasonal allergies    Sleep apnea    uses cpap   Smoking history    quit 07/2010   Thyroid disease    Urinary incontinence    Past Surgical History:  Procedure Laterality Date   ABDOMINAL HYSTERECTOMY     2003   CHOLECYSTECTOMY  1986   COLONOSCOPY  2008   3 polyps   COLONOSCOPY WITH PROPOFOL N/A 02/06/2015   Procedure: COLONOSCOPY WITH PROPOFOL;  Surgeon: Lucilla Lame, MD;  Location: [facility] ENDOSCOPY;  Service: Endoscopy;  Laterality: N/A;   COLONOSCOPY WITH PROPOFOL N/A 01/27/2017   Procedure: COLONOSCOPY WITH PROPOFOL;  Surgeon: Lucilla Lame, MD;  Location[facility] ENDOSCOPY;  Service: Endoscopy;  Laterality: N/A;   CORONARY ANGIOPLASTY     w/ stent placement x2   CORONARY ARTERY BYPASS GRAFT  2012   Dr [name]   CORONARY STENT PLACEMENT  2003   S/P MI   CORONARY STENT PLACEMENT  2007   [place]   ESOPHAGOGASTRODUODENOSCOPY (EGD) WITH PROPOFOL N/A 02/06/2015   Procedure: ESOPHAGOGASTRODUODENOSCOPY (EGD) WITH PROPOFOL;  Surgeon: Lucilla Lame, MD;  Location: [facility] ENDOSCOPY;  Service: Endoscopy;  Laterality: N/A;    ESOPHAGOGASTRODUODENOSCOPY (EGD) WITH PROPOFOL N/A 01/27/2017   Procedure: ESOPHAGOGASTRODUODENOSCOPY (EGD) WITH PROPOFOL;  Surgeon: Lucilla Lame, MD;  Location: [facility] ENDOSCOPY;  Service: Endoscopy;  Laterality: N/A;   ESOPHAGOGASTRODUODENOSCOPY (EGD) WITH PROPOFOL N/A 09/01/2019   Procedure: ESOPHAGOGASTRODUODENOSCOPY (EGD) WITH PROPOFOL;  Surgeon: Lin Landsman, MD;  Location: [place];  Service: Gastroenterology;  Laterality: N/A;   FEMORAL ARTERY STENT  10/2010   right sided (Dr. Burt Knack)   KNEE ARTHROSCOPY WITH MEDIAL MENISECTOMY Right 08/20/2017   Procedure: KNEE ARTHROSCOPY WITH MEDIAL  AND LATERAL MENISECTOMY;  Surgeon: Hessie Knows, MD;  Location: [facility] ORS;  Service: Orthopedics;  Laterality: Right;   POSTERIOR CERVICAL LAMINECTOMY N/A 03/08/2018   Procedure: POSTERIOR CERVICAL LAMINECTOMY-C7;  Surgeon: Deetta Perla, MD;  Location: [facility] ORS;  Service: Neurosurgery;  Laterality: N/A;   TONSILLECTOMY     TOTAL KNEE ARTHROPLASTY Right 04/14/2019   Procedure: RIGHT TOTAL KNEE ARTHROPLASTY;  Surgeon: Hessie Knows, MD;  Location: [facility] ORS;  Service: Orthopedics;  Laterality: Right;   TUBAL LIGATION      Allergies  Allergies  Allergen Reactions   Latex Rash        Shellfish Allergy Anaphylaxis    Hard shellfish/swelling in throat   Sulfa Antibiotics Anaphylaxis    Swelling in throat.   Sulfonamide Derivatives Anaphylaxis    Swelling in throat.   Watermelon [Citrullus Vulgaris] Other (See Comments)    Throat itchy   Penicillins Itching   Soy Allergy     Diarrhea/upset stomach     Tomato Itching    Throat itchy - also Raw carrots   Other Swelling    History of Present Illness    61 year old female with above complex past medical history including coronary artery disease status post CABG x4 in 2012, chronic combined systolic and diastolic congestive heart failure, ischemic cardiomyopathy, hypertension, hyperlipidemia, diabetes, COPD, peripheral arterial disease status  post right SFA arthrectomy and PTA in June 2012, and sleep apnea.    She last underwent stress testing in September 2018 as part of her preoperative clearance for right knee surgery in the setting of her medial meniscus tear.  The sensitivity and specificity of the study was limited by significant artifact however, there was no areas of ischemia noted.  Echocardiogram showed stable, mild LV dysfunction with an EF of 40 to 45%, she was cleared for surgery which took place 07/2017.  In the setting of vascular disease she had low-dose Xarelto 2.5 mg twice daily added to her medication regimen in 2019.    She was last seen by Dr. [name] in 09/06/2021.  At that time it was ordered for repeat stress and echocardiogram which unfortunately had not been  completed.  She was then subsequently supposed to undergo a colonoscopy which was going to require clearance.  The date of the procedure was can be 10/14/2021.  Unfortunately the procedure was canceled.  She was evaluated in [facility] emergency department on 03/11/2022 where she was found to have hemoptysis.  Vitals were stable.  High-sensitivity troponins were negative x2.  CTA of the chest ruled out pulmonary embolism.  CT of the head was negative.  She was subsequently discharged home with outpatient follow-up.  She returns to clinic today after being evaluated in the emergency department on 03/11/2022 for hemoptysis.  Patient states she woke up in the morning coughed and she felt like sputum was in the back of her throat when she had coughed it out she noticed some blood in it.  She had 1 additional episode with some blood-streaked sputum.  She did have some shortness of breath over the last week but also had a recent sinus infection and continued to feel somewhat congestion. She stated that she has been having discomfort but it was in the middle of her back primarily radiating from the right and left sides and was exacerbated by deep breathing.  She was subsequently  evaluated, discharged home, and was told to follow-up with PCP.  She did get in with her primary care provider who had told her that she did have bronchitis and placed her on antibiotic therapy of Augmentin for 7 days.  Today she denies any continued chest discomfort, endorses occasional shortness of breath, fatigue, and continued cough without hemoptysis.  She is accompanied by her sister today. Home Medications    Current Outpatient Medications  Medication Sig Dispense Refill   albuterol (VENTOLIN HFA) 108 (90 Base) MCG/ACT inhaler Inhale 2 puffs into the lungs every 6 (six) hours as needed for wheezing or shortness of breath. 1 g 12   aspirin EC 81 MG tablet Take 1 tablet (81 mg total) by mouth daily. 90 tablet 3   Azelastine HCl 0.15 % SOLN Place 2 sprays into both nostrils daily as needed (allergies). 30 mL 11   benzonatate (TESSALON) 100 MG capsule Take 1 capsule (100 mg total) by mouth 3 (three) times daily as needed for cough. 60 capsule 0   clobetasol cream (TEMOVATE) 0.93 % Apply 1 application. topically 2 (two) times daily. Prn left elbow 30 g 0   Cyanocobalamin (VITAMIN B-12) 1000 MCG SUBL Place 0.001 tablets (1 mcg total) under the tongue daily. 90 tablet 3   cyclobenzaprine (FLEXERIL) 5 MG tablet Take 1-2 tablets (5-10 mg total) by mouth daily as needed for muscle spasms. 60 tablet 11   dapagliflozin propanediol (FARXIGA) 10 MG TABS tablet Take 1 tablet (10 mg total) by mouth daily before breakfast. 90 tablet 3   dicyclomine (BENTYL) 10 MG capsule Take 1 capsule (10 mg total) by mouth 3 (three) times daily before meals. As needed for abdominal pain 120 capsule 11   esomeprazole (NEXIUM) 40 MG capsule Take 1 capsule (40 mg total) by mouth daily. 90 capsule 3   Evolocumab (REPATHA SURECLICK) 267 MG/ML SOAJ INJECT '140MG'$  INTO THE SKIN ONCE EVERY 14 DAYS 2 mL 3   ezetimibe (ZETIA) 10 MG tablet Take 1 tablet (10 mg total) by mouth daily. 90 tablet 3   Fluticasone-Umeclidin-Vilant (TRELEGY  ELLIPTA) 100-62.5-25 MCG/ACT AEPB Inhale 1 puff into the lungs daily. Rinse mouth 1 each 11   furosemide (LASIX) 20 MG tablet Qd and 2nd tablet prn for edema 180 tablet 3  hydrOXYzine (ATARAX) 25 MG tablet TAKE 1 TO 2 TABLETS BY MOUTH ONCE DAILY AS NEEDED 60 tablet 11   ipratropium (ATROVENT) 0.06 % nasal spray Place 2 sprays into both nostrils 4 (four) times daily. 15 mL 12   ipratropium-albuterol (DUONEB) 0.5-2.5 (3) MG/3ML SOLN USE 1 AMPULE IN NEBULIZER TWICE DAILY AS NEEDED FOR WHEEZING OR SHORTNESS OF BREATH 360 mL 0   isosorbide mononitrate (IMDUR) 30 MG 24 hr tablet Take 1 tablet by mouth twice daily 180 tablet 2   levocetirizine (XYZAL) 5 MG tablet Take 1 tablet (5 mg total) by mouth at bedtime as needed for allergies. 90 tablet 3   linaclotide (LINZESS) 72 MCG capsule TAKE 1 CAPSULE BY MOUTH ONCE DAILY BEFORE BREAKFAST 90 capsule 3   meclizine (ANTIVERT) 25 MG tablet TAKE 1 TABLET BY MOUTH TWICE DAILY AS NEEDED FOR DIZZINESS 60 tablet 11   metoprolol succinate (TOPROL-XL) 50 MG 24 hr tablet TAKE 1 TABLET BY MOUTH ONCE DAILY WITH OR IMMEDIATELY FOLLOWING A MEAL 90 tablet 0   montelukast (SINGULAIR) 10 MG tablet Take 1 tablet (10 mg total) by mouth at bedtime. 90 tablet 3   nitroGLYCERIN (NITROSTAT) 0.4 MG SL tablet DISSOLVE ONE TABLET UNDER THE TONGUE EVERY 5 MINUTES AS NEEDED FOR CHEST PAIN.  DO NOT EXCEED A TOTAL OF 3 DOSES IN 15 MINUTES 25 tablet 2   nystatin (MYCOSTATIN) 100000 UNIT/ML suspension Take 5 mLs (500,000 Units total) by mouth 4 (four) times daily. (Patient taking differently: Take 5 mLs by mouth as needed.) 240 mL 0   olopatadine (PATANOL) 0.1 % ophthalmic solution Place 1 drop into both eyes 2 (two) times daily. 15 mL 11   ondansetron (ZOFRAN) 4 MG tablet Take 1 tablet (4 mg total) by mouth every 8 (eight) hours as needed for nausea or vomiting. 20 tablet 5   OneTouch Delica Lancets 28B MISC USE TO CHECK GLUCOSE IN THE MORNING AND AT BEDTIME 200 each 0   ONETOUCH VERIO test  strip USE 1 STRIP TO CHECK GLUCOSE TWICE DAILY ONCE  IN  THE  MORNING  AND  ONCE  AT  BEDTIME 200 each 0   PARoxetine (PAXIL) 20 MG tablet Take 1 tablet by mouth once daily 90 tablet 0   potassium chloride (KLOR-CON M) 10 MEQ tablet Take 1 tablet (10 mEq total) by mouth daily. 90 tablet 3   pregabalin (LYRICA) 75 MG capsule Take 1 capsule by mouth twice daily 60 capsule 5   rivaroxaban (XARELTO) 2.5 MG TABS tablet Take 1 tablet by mouth twice daily 180 tablet 1   rosuvastatin (CRESTOR) 20 MG tablet TAKE 1 TABLET BY MOUTH AT BEDTIME 90 tablet 0   sodium chloride (OCEAN) 0.65 % SOLN nasal spray Place 2 sprays into both nostrils daily as needed for congestion. 30 mL 11   traZODone (DESYREL) 50 MG tablet Take 0.5-1 tablets (25-50 mg total) by mouth at bedtime as needed for sleep. 90 tablet 3   Vibegron (GEMTESA) 75 MG TABS Take 75 mg by mouth daily. D/c myrbetriq 90 tablet 3   No current facility-administered medications for this visit.     Family History    Family History  Problem Relation Age of Onset   Other Mother    Breast cancer Mother 38       breast cancer, late 48's   Cancer Mother        breast   Hypertension Father    Heart failure Father    Diabetes Father  Colon cancer Father 49   Glaucoma Father    Cancer Father        colorectal    Heart disease Father    Other Father        glaucoma   Brain cancer Sister    Arthritis Sister    Diabetes Sister    Hypertension Sister    Kidney disease Sister    Diabetes Brother    Hypertension Brother    Acute myelogenous leukemia Grandson        06/2019   Coronary artery disease Neg Hx    Stroke Neg Hx    She indicated that her mother is deceased. She indicated that her father is deceased. She indicated that both of her sisters are alive. She indicated that the status of her brother is unknown. She indicated that the status of her neg hx is unknown. She indicated that the status of her grandson is unknown.  Social History     Social History   Socioeconomic History   Marital status: Legally Separated    Spouse name: Not on file   Number of children: Not on file   Years of education: Not on file   Highest education level: Not on file  Occupational History   Not on file  Tobacco Use   Smoking status: Former    Packs/day: 2.00    Years: 38.00    Total pack years: 76.00    Types: Cigarettes    Quit date: 08/12/2010    Years since quitting: 11.6   Smokeless tobacco: Never  Vaping Use   Vaping Use: Never used  Substance and Sexual Activity   Alcohol use: No   Drug use: No    Types: Cocaine    Comment: Remote Hx (crack cocaine and marijuana).none since 69yr plus   Sexual activity: Yes  Other Topics Concern   Not on file  Social History Narrative   Caffeine: 1 cup coffee/day   Lives with family, no pets   Occupation: industrial work, 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   Edu: 11th grade   Activity: no regular exercise   Diet: good water, vegetables daily, low salt diet   Lives with sister and other family    No guns, wears seat belts, safe in relationship    2 kids    GED 1 year of college    Social Determinants of Health   Financial Resource Strain: Low Risk  (11/22/2021)   Overall Financial Resource Strain (CARDIA)    Difficulty of Paying Living Expenses: Not hard at all  Food Insecurity: No Food Insecurity (11/22/2021)   Hunger Vital Sign    Worried About Running Out of Food in the Last Year: Never true    RCaswell Beachin the Last Year: Never true  Transportation Needs: No Transportation Needs (11/22/2021)   PRAPARE - THydrologist(Medical): No    Lack of Transportation (Non-Medical): No  Physical Activity: Unknown (08/03/2020)   Exercise Vital Sign    Days of Exercise per Week: 0 days    Minutes of Exercise per Session: Not on file  Stress: No Stress Concern Present (08/06/2021)   F[place]. [place]    Feeling of Stress : Not at all  Social Connections: Unknown (08/06/2021)   Social Connection and Isolation Panel [NHANES]    Frequency of Communication with Friends and Family: More than three times a week    Frequency of  Social Gatherings with Friends and Family: Once a week    Attends Religious Services: 1 to 4 times per year    Active Member of [employer] or Organizations: Yes    Attends Music therapist: More than 4 times per year    Marital Status: Not on file  Intimate Partner Violence: Not At Risk (11/22/2021)   Humiliation, Afraid, Rape, and Kick questionnaire    Fear of Current or Ex-Partner: No    Emotionally Abused: No    Physically Abused: No    Sexually Abused: No     Review of Systems    General:  No chills, fever, night sweats or weight changes.  Endorses fatigue Cardiovascular:  No chest pain, dyspnea on exertion, edema, orthopnea, palpitations, paroxysmal nocturnal dyspnea. Dermatological: No rash, lesions/masses Respiratory: Endorses cough with associated dyspnea Urologic: No hematuria, dysuria, endorses frequency Abdominal:   No nausea, vomiting, diarrhea, bright red blood per rectum, melena, or hematemesis Neurologic:  No visual changes, wkns, changes in mental status. All other systems reviewed and are otherwise negative except as noted above.     Physical Exam    VS:  BP 110/76 (BP Location: Left Arm, Patient Position: Sitting, Cuff Size: Large)   Pulse 76   Ht '5\' 5"'$  (1.651 m)   Wt 205 lb (93 kg)   SpO2 98%   BMI 34.11 kg/m  , BMI Body mass index is 34.11 kg/m.     GEN: Well nourished, well developed, in no acute distress. HEENT: normal. Glasses on Neck: Supple, no JVD, carotid bruits, or masses. Cardiac: RRR, no murmurs, rubs, or gallops. No clubbing, cyanosis, trace edema.  Radials/DP/PT 2+ and equal bilaterally.  Respiratory:  Respirations regular and unlabored, clear with diminished right base to auscultation bilaterally. GI: Soft,  nontender, nondistended, BS + x 4. MS: no deformity or atrophy. Skin: warm and dry, no rash. Neuro:  Strength and sensation are intact. Psych: Normal affect.  Accessory Clinical Findings    ECG personally reviewed by me today-sinus rhythm with a rate of 76, left axis deviation, ST depression and T wave inversions diffusely noted- No acute changes  Lab Results  Component Value Date   WBC 4.5 03/11/2022   HGB 15.5 (H) 03/11/2022   HCT 48.5 (H) 03/11/2022   MCV 95.7 03/11/2022   PLT 152 03/11/2022   Lab Results  Component Value Date   CREATININE 1.04 (H) 03/11/2022   BUN 15 03/11/2022   NA 142 03/11/2022   K 3.8 03/11/2022   CL 112 (H) 03/11/2022   CO2 23 03/11/2022   Lab Results  Component Value Date   ALT 16 12/06/2021   AST 18 12/06/2021   ALKPHOS 80 12/06/2021   BILITOT 0.4 12/06/2021   Lab Results  Component Value Date   CHOL 134 12/06/2021   HDL 43.70 12/06/2021   LDLCALC 68 12/06/2021   TRIG 113.0 12/06/2021   CHOLHDL 3 12/06/2021    Lab Results  Component Value Date   HGBA1C 6.3 12/06/2021    Assessment & Plan   1.  CAD status post CABG with stable angina with continued atypical and typical symptoms of chest pressure tightness or occasional stabbing pressure on exertion or during stress.  Previously she had seen Dr. [name] back in February of this year where he had ordered an echocardiogram and a Myoview Lexiscan stress test.  Unfortunately the patient is having issues with urinary frequency and is undergoing pelvic floor therapy if she has the inability to lie or  sit still greater then 15 minutes without having to go to the bathroom to complete the test.  She would like to reschedule her testing once she has finished with therapy.  Today she is pain-free.  There were no ischemic changes noted on her EKG.  She will continue with aspirin, Zetia, Imdur for rosuvastatin, Nitrostat as needed.  Patient is also been advised if symptoms increase in frequency or  intensity she is to return to the emergency department or at minimum have her testing completed.  2.  Ischemic cardiomyopathy with previous LVEF of 40-45, G1 DD in 03/2017.  Echocardiogram was ordered at her last visit in February but she has yet to undergo that test.  She is to continue to monitor Farxiga, Lasix, and Toprol XL.  3.  Essential hypertension with blood pressure of 110/76 today.  She is to continue with her current medication regimen without changes made today.  She is to continue to monitor her blood pressure at home as well.  Encouraged to continue to decrease sodium intake.  4.  Hyperlipidemia with an LDL of 68 12/06/2021.  She is to continue on sodium and rosuvastatin.  No changes made to her medication regimen today.  This continues to be managed by her PCP  5.  Peripheral arterial disease status post arthrectomy of the right SFA on 01/15/2011.  She continues to have palpable pulses to her bilateral lower extremities.  She has started to have some potential neuropathy-like symptoms to this bilateral lower extremity has been been encouraged to increase her ambulation and exercise therapy.  She is to continue with her Xarelto 2.5 mg twice daily.  6.  OSA with continued compliance with CPAP  7.  Hemoptysis in the setting of acute bronchitis that is currently resolved.  She continues with some discomfort on inspiration.  CTA of the chest was negative for PE.  She has noted improvement since her emergency department visit after being placed on antibiotic therapy by her PCP.  Previous antibiotics were given for 7 days.  With improvement of symptoms on antibiotic therapy can consider increasing the length of her antibiotics to 10-14 days.  Advised patient to call her PCP with improved symptoms to determine if increasing antibiotic run would be beneficial for patient.  8.  Disposition patient return to clinic to see MD/APP in 2 months.  She has been advised again that once her therapy is over  to have her testing scheduled to evaluate for any ischemic causes of her previous symptoms.   Dianna Ewald, NP 03/18/2022, 1:05 PM

## 2022-03-19 ENCOUNTER — Telehealth: Payer: Self-pay | Admitting: Cardiovascular Disease

## 2022-03-19 NOTE — Telephone Encounter (Signed)
Pt calling back to inform that she got in touch with her PCP and she'll be following up with her tomorrow so she can prescribe this antibiotic.

## 2022-03-19 NOTE — Telephone Encounter (Signed)
Pt c/o medication issue:  1. Name of Medication:   Amoxycillin  2. How are you currently taking this medication (dosage and times per day)?   As prescribed  3. Are you having a reaction (difficulty breathing--STAT)?   No  4. What is your medication issue?    Patient called stating she did not get her prescription refilled by her doctor and she was told to call RN to get prescription filled to [facility] [place] [facility] [place].

## 2022-03-20 ENCOUNTER — Ambulatory Visit (INDEPENDENT_AMBULATORY_CARE_PROVIDER_SITE_OTHER): Payer: Medicare HMO | Admitting: Internal Medicine

## 2022-03-20 ENCOUNTER — Encounter: Payer: Self-pay | Admitting: Internal Medicine

## 2022-03-20 ENCOUNTER — Telehealth: Payer: Self-pay | Admitting: Internal Medicine

## 2022-03-20 VITALS — BP 124/70 | HR 71 | Temp 98.7°F | Ht 65.0 in | Wt 208.2 lb

## 2022-03-20 DIAGNOSIS — R0982 Postnasal drip: Secondary | ICD-10-CM

## 2022-03-20 DIAGNOSIS — Z124 Encounter for screening for malignant neoplasm of cervix: Secondary | ICD-10-CM

## 2022-03-20 DIAGNOSIS — N393 Stress incontinence (female) (male): Secondary | ICD-10-CM | POA: Diagnosis not present

## 2022-03-20 DIAGNOSIS — B379 Candidiasis, unspecified: Secondary | ICD-10-CM

## 2022-03-20 DIAGNOSIS — J0111 Acute recurrent frontal sinusitis: Secondary | ICD-10-CM

## 2022-03-20 DIAGNOSIS — Z1231 Encounter for screening mammogram for malignant neoplasm of breast: Secondary | ICD-10-CM

## 2022-03-20 DIAGNOSIS — Z23 Encounter for immunization: Secondary | ICD-10-CM | POA: Diagnosis not present

## 2022-03-20 DIAGNOSIS — J309 Allergic rhinitis, unspecified: Secondary | ICD-10-CM | POA: Diagnosis not present

## 2022-03-20 DIAGNOSIS — J324 Chronic pansinusitis: Secondary | ICD-10-CM | POA: Diagnosis not present

## 2022-03-20 MED ORDER — LEVOFLOXACIN 750 MG PO TABS: 750.0000 mg | ORAL_TABLET | Freq: Every day | ORAL | 0 refills | Status: DC

## 2022-03-20 MED ORDER — SALINE SPRAY 0.65 % NA SOLN: 2.0000 | Freq: Every day | NASAL | 11 refills | Status: DC | PRN

## 2022-03-20 MED ORDER — PNEUMOCOCCAL 20-VAL CONJ VACC 0.5 ML IM SUSY: 0.5000 mL | PREFILLED_SYRINGE | INTRAMUSCULAR | 0 refills | Status: DC

## 2022-03-20 MED ORDER — PNEUMOCOCCAL 20-VAL CONJ VACC 0.5 ML IM SUSY: 0.5000 mL | PREFILLED_SYRINGE | INTRAMUSCULAR | 0 refills | Status: AC

## 2022-03-20 MED ORDER — FLUTICASONE PROPIONATE 50 MCG/ACT NA SUSP: 2.0000 | Freq: Every day | NASAL | 11 refills | Status: DC

## 2022-03-20 MED ORDER — FLUCONAZOLE 150 MG PO TABS: 150.0000 mg | ORAL_TABLET | Freq: Once | ORAL | 0 refills | Status: AC

## 2022-03-20 NOTE — Telephone Encounter (Signed)
Patient saw Dr Olivia Mackie today and forgot to ask if she would give her a handicapp plaque.

## 2022-03-20 NOTE — Progress Notes (Signed)
Chief Complaint  Patient presents with   Follow-up    3 MONTH F/U AND f/u on ED visit for bronchitis and coughing up blood. Pt is no longer coughing up blood but still coughing up phlegm   ED f/u 03/11/22  C/o PND, sinus pressure, spitting up thick blood tinged mucous and left back pain CTA chest negative pna and pe not feeling better since ED visit allergies are a factor as well I.e ragweek prev pulm disc biologic with pt next appt 03/27/22 Dr. Halford Chessman to consider this with the patient with h/o asthma and allergies   C/o urinary incontinence overactive bladder will refer to ob/gyn to disc currently in pelvic PT   Review of Systems  Constitutional:  Negative for weight loss.  HENT:  Negative for hearing loss.        PND  Eyes:  Negative for blurred vision.  Respiratory:  Positive for sputum production. Negative for shortness of breath.   Cardiovascular:  Negative for chest pain.  Gastrointestinal:  Negative for abdominal pain and blood in stool.  Genitourinary:  Negative for dysuria.  Musculoskeletal:  Negative for falls and joint pain.  Skin:  Negative for rash.  Neurological:  Negative for headaches.  Psychiatric/Behavioral:  Negative for depression.    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 being followed by neurosurgery/eye doctor and pcp. monitoring size.causes sinus problems   Bronchitis    Cardiac arrhythmia    Cervical neck pain with evidence of disc disease    C5/6 disease, MRI done late 2012 - no records available   Chronic combined systolic and diastolic CHF (congestive heart failure) ([place])    a. 08/2010 Echo: mildly reduced EF 40-45%, mild diffuse hypokinesis; b. 06/2016 Echo: EF 50%, no rwma, mild to mod TR; c. 03/2017 Echo: EF 40-45%, Gr1 DD (prior echo reviewed and EF felt to be lower than  reported).   Chronic pain    Cocaine abuse, in remission (HCC)    clean x 24 years   Colon polyps    COPD (chronic obstructive pulmonary disease) ([place])    a. 12/2018 PFT: No obvious obst/restrictive dzs.   Coronary artery disease    a. PCI of LCX 2003; b. PCI of the LAD 2012 with a (2.5 x 8 mm BMS);  c.s/p CABG 4/12: L-LAD, VG-Dx, VG-OM, VG-RCA (Dr. Prescott Gum);  d. 01/2012 MV: inf infarct, attenuation, no ischemia; e. 02/2017 MV: signifi attenuation artifact. Fixed basal antlat/inflat scar vs artifact. Reversible apical lat and mid antlat defect - ? atten vs ischemia. F/u echo w/o wma->Med rx.   Depression    Diabetes mellitus without complication ([place])    Family history of colon cancer    Generalized headaches    frequent   GERD (gastroesophageal reflux disease)    Headache    Heart murmur    Hematochezia    Hepatitis    history of hepatitis b   History of cervical cancer    s/p cryotherapy   History of drug abuse ([place])    cocaine, marijuana, clean since 1989   History of hepatitis B    from eating undercooked liver   History of MI (myocardial infarction)    Hyperlipidemia    Hypertension  Ischemic cardiomyopathy    a. 03/2017 Echo: EF 40-45%, Gr1 DD.   Myocardial infarction ([place]) 2003, 2012   PAD (peripheral artery disease) ([place])    a. s/p Right SFA atherectomy and PTA 01/15/11; b. 07/2018 ABI: R 1.02, L 1.09.   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 12/2018 MRI Brain: Stable pituitary mass w/ 28m area of necrosis. Mass abuts R cavernous sinus w/o definite invasion.   Polyp of colon    Seasonal allergies    Sleep apnea    uses cpap   Smoking history    quit 07/2010   Thyroid disease    Urinary incontinence    Past Surgical History:  Procedure Laterality Date   ABDOMINAL HYSTERECTOMY     2003   CHOLECYSTECTOMY  1986   COLONOSCOPY  2008   3 polyps   COLONOSCOPY WITH PROPOFOL N/A 02/06/2015   Procedure: COLONOSCOPY WITH PROPOFOL;  Surgeon: DLucilla Lame MD;  Location: [facility] ENDOSCOPY;   Service: Endoscopy;  Laterality: N/A;   COLONOSCOPY WITH PROPOFOL N/A 01/27/2017   Procedure: COLONOSCOPY WITH PROPOFOL;  Surgeon: WLucilla Lame MD;  [facility];  Service: Endoscopy;  Laterality: N/A;   CORONARY ANGIOPLASTY     w/ stent placement x2   CORONARY ARTERY BYPASS GRAFT  2012   Dr GRockey Situ  CORONARY STENT PLACEMENT  2003   S/P MI   CORONARY STENT PLACEMENT  2007   [place]   ESOPHAGOGASTRODUODENOSCOPY (EGD) WITH PROPOFOL N/A 02/06/2015   Procedure: ESOPHAGOGASTRODUODENOSCOPY (EGD) WITH PROPOFOL;  Surgeon: DLucilla Lame MD;  Location: [facility] ENDOSCOPY;  Service: Endoscopy;  Laterality: N/A;   ESOPHAGOGASTRODUODENOSCOPY (EGD) WITH PROPOFOL N/A 01/27/2017   Procedure: ESOPHAGOGASTRODUODENOSCOPY (EGD) WITH PROPOFOL;  Surgeon: WLucilla Lame MD;  Location: [facility] END[facility]COPY;  Service: Endoscopy;  Laterality: N/A;   ESOPHAGOGASTRODUODENOSCOPY (EGD) WITH PROPOFOL N/A 09/01/2019   Procedure: ESOPHAGOGASTRODUODENOSCOPY (EGD) WITH PROPOFOL;  Surgeon: VLin Landsman MD;  [place]  Service: Gastroenterology;  Laterality: N/A;   FEMORAL ARTERY STENT  10/2010   right sided (Dr. CBurt Knack   KNEE ARTHROSCOPY WITH MEDIAL MENISECTOMY Right 08/20/2017   Procedure: KNEE ARTHROSCOPY WITH MEDIAL  AND LATERAL MENISECTOMY;  Surgeon: MHessie Knows MD;  Location: [facility];  Service: Orthopedics;  Laterality: Right;   POSTERIOR CERVICAL LAMINECTOMY N/A 03/08/2018   Procedure: POSTERIOR CERVICAL LAMINECTOMY-C7;  Surgeon: CDeetta Perla MD;  Location: [facility];  Service: Neurosurgery;  Laterality: N/A;   TONSILLECTOMY     TOTAL KNEE ARTHROPLASTY Right 04/14/2019   Procedure: RIGHT TOTAL KNEE ARTHROPLASTY;  Surgeon: MHessie Knows MD;  Location: [facility];  Service: Orthopedics;  Laterality: Right;   TUBAL LIGATION     Family History  Problem Relation Age of Onset   Other Mother    Breast cancer Mother 627      breast cancer, late 659's  Cancer Mother        breast   Hypertension Father     Heart failure Father    Diabetes Father    Colon cancer Father 514  Glaucoma Father    Cancer Father        colorectal    Heart disease Father    Other Father        glaucoma   Brain cancer Sister    Arthritis Sister    Diabetes Sister    Hypertension Sister    Kidney disease Sister    Diabetes Brother    Hypertension Brother    Acute myelogenous leukemia Grandson  06/2019   Coronary artery disease Neg Hx    Stroke Neg Hx    Social History   Socioeconomic History   Marital status: Legally Separated    Spouse name: Not on file   Number of children: Not on file   Years of education: Not on file   Highest education level: Not on file  Occupational History   Not on file  Tobacco Use   Smoking status: Former    Packs/day: 2.00    Years: 38.00    Total pack years: 76.00    Types: Cigarettes    Quit date: 08/12/2010    Years since quitting: 11.6   Smokeless tobacco: Never  Vaping Use   Vaping Use: Never used  Substance and Sexual Activity   Alcohol use: No   Drug use: No    Types: Cocaine    Comment: Remote Hx (crack cocaine and marijuana).none since 61yr plus   Sexual activity: Yes  Other Topics Concern   Not on file  Social History Narrative   Caffeine: 1 cup coffee/day   Lives with family, no pets   Occupation: industrial work, 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   Edu: 11th grade   Activity: no regular exercise   Diet: good water, vegetables daily, low salt diet   Lives with sister and other family    No guns, wears seat belts, safe in relationship    2 kids    GED 1 year of college    Social Determinants of Health   Financial Resource Strain: Low Risk  (11/22/2021)   Overall Financial Resource Strain (CARDIA)    Difficulty of Paying Living Expenses: Not hard at all  Food Insecurity: No Food Insecurity (11/22/2021)   Hunger Vital Sign    Worried About Running Out of Food in the Last Year: Never true    RPinein the Last Year: Never true   Transportation Needs: No Transportation Needs (11/22/2021)   PRAPARE - THydrologist(Medical): No    Lack of Transportation (Non-Medical): No  Physical Activity: Unknown (08/03/2020)   Exercise Vital Sign    Days of Exercise per Week: 0 days    Minutes of Exercise per Session: Not on file  Stress: No Stress Concern Present (08/06/2021)   [place]   Feeling of Stress : Not at all  Social Connections: Unknown (08/06/2021)   Social Connection and Isolation Panel [NHANES]    Frequency of Communication with Friends and Family: More than three times a week    Frequency of Social Gatherings with Friends and Family: Once a week    Attends Religious Services: 1 to 4 times per year    Active Member of [employer] Organizations: Yes    Attends CMusic therapist More than 4 times per year    Marital Status: Not on file  Intimate Partner Violence: Not At Risk (11/22/2021)   Humiliation, Afraid, Rape, and Kick questionnaire    Fear of Current or Ex-Partner: No    Emotionally Abused: No    Physically Abused: No    Sexually Abused: No   Current Meds  Medication Sig   albuterol (VENTOLIN HFA) 108 (90 Base) MCG/ACT inhaler Inhale 2 puffs into the lungs every 6 (six) hours as needed for wheezing or shortness of breath.   aspirin EC 81 MG tablet Take 1 tablet (81 mg total) by mouth daily.   Azelastine HCl  0.15 % SOLN Place 2 sprays into both nostrils daily as needed (allergies).   benzonatate (TESSALON) 100 MG capsule Take 1 capsule (100 mg total) by mouth 3 (three) times daily as needed for cough.   clobetasol cream (TEMOVATE) 7.82 % Apply 1 application. topically 2 (two) times daily. Prn left elbow   Cyanocobalamin (VITAMIN B-12) 1000 MCG SUBL Place 0.001 tablets (1 mcg total) under the tongue daily.   cyclobenzaprine (FLEXERIL) 5 MG tablet Take 1-2 tablets (5-10 mg total) by mouth daily as needed  for muscle spasms.   dapagliflozin propanediol (FARXIGA) 10 MG TABS tablet Take 1 tablet (10 mg total) by mouth daily before breakfast.   dicyclomine (BENTYL) 10 MG capsule Take 1 capsule (10 mg total) by mouth 3 (three) times daily before meals. As needed for abdominal pain   esomeprazole (NEXIUM) 40 MG capsule Take 1 capsule (40 mg total) by mouth daily.   Evolocumab (REPATHA SURECLICK) 956 MG/ML SOAJ INJECT '140MG'$  INTO THE SKIN ONCE EVERY 14 DAYS   ezetimibe (ZETIA) 10 MG tablet Take 1 tablet (10 mg total) by mouth daily.   fluconazole (DIFLUCAN) 150 MG tablet Take 1 tablet (150 mg total) by mouth once for 1 dose. Repeat dose in 3 days if needed   fluticasone (FLONASE) 50 MCG/ACT nasal spray Place 2 sprays into both nostrils daily. Prn nasal congestion   Fluticasone-Umeclidin-Vilant (TRELEGY ELLIPTA) 100-62.5-25 MCG/ACT AEPB Inhale 1 puff into the lungs daily. Rinse mouth   furosemide (LASIX) 20 MG tablet Qd and 2nd tablet prn for edema   hydrOXYzine (ATARAX) 25 MG tablet TAKE 1 TO 2 TABLETS BY MOUTH ONCE DAILY AS NEEDED   ipratropium (ATROVENT) 0.06 % nasal spray Place 2 sprays into both nostrils 4 (four) times daily.   ipratropium-albuterol (DUONEB) 0.5-2.5 (3) MG/3ML SOLN USE 1 AMPULE IN NEBULIZER TWICE DAILY AS NEEDED FOR WHEEZING OR SHORTNESS OF BREATH   isosorbide mononitrate (IMDUR) 30 MG 24 hr tablet Take 1 tablet by mouth twice daily   levocetirizine (XYZAL) 5 MG tablet Take 1 tablet (5 mg total) by mouth at bedtime as needed for allergies.   levofloxacin (LEVAQUIN) 750 MG tablet Take 1 tablet (750 mg total) by mouth daily. With food   linaclotide (LINZESS) 72 MCG capsule TAKE 1 CAPSULE BY MOUTH ONCE DAILY BEFORE BREAKFAST   meclizine (ANTIVERT) 25 MG tablet TAKE 1 TABLET BY MOUTH TWICE DAILY AS NEEDED FOR DIZZINESS   metoprolol succinate (TOPROL-XL) 50 MG 24 hr tablet TAKE 1 TABLET BY MOUTH ONCE DAILY WITH OR IMMEDIATELY FOLLOWING A MEAL   montelukast (SINGULAIR) 10 MG tablet Take 1  tablet (10 mg total) by mouth at bedtime.   nitroGLYCERIN (NITROSTAT) 0.4 MG SL tablet DISSOLVE ONE TABLET UNDER THE TONGUE EVERY 5 MINUTES AS NEEDED FOR CHEST PAIN.  DO NOT EXCEED A TOTAL OF 3 DOSES IN 15 MINUTES   nystatin (MYCOSTATIN) 100000 UNIT/ML suspension Take 5 mLs (500,000 Units total) by mouth 4 (four) times daily. (Patient taking differently: Take 5 mLs by mouth as needed.)   olopatadine (PATANOL) 0.1 % ophthalmic solution Place 1 drop into both eyes 2 (two) times daily.   ondansetron (ZOFRAN) 4 MG tablet Take 1 tablet (4 mg total) by mouth every 8 (eight) hours as needed for nausea or vomiting.   OneTouch Delica Lancets 21H MISC USE TO CHECK GLUCOSE IN THE MORNING AND AT BEDTIME   ONETOUCH VERIO test strip USE 1 STRIP TO CHECK GLUCOSE TWICE DAILY ONCE  IN  THE  MORNING  AND  ONCE  AT  BEDTIME   PARoxetine (PAXIL) 20 MG tablet Take 1 tablet by mouth once daily   potassium chloride (KLOR-CON M) 10 MEQ tablet Take 1 tablet (10 mEq total) by mouth daily.   pregabalin (LYRICA) 75 MG capsule Take 1 capsule by mouth twice daily   rivaroxaban (XARELTO) 2.5 MG TABS tablet Take 1 tablet by mouth twice daily   rosuvastatin (CRESTOR) 20 MG tablet TAKE 1 TABLET BY MOUTH AT BEDTIME   traZODone (DESYREL) 50 MG tablet Take 0.5-1 tablets (25-50 mg total) by mouth at bedtime as needed for sleep.   Vibegron (GEMTESA) 75 MG TABS Take 75 mg by mouth daily. D/c myrbetriq   [DISCONTINUED] pneumococcal 20-valent conjugate vaccine (PREVNAR 20) 0.5 ML injection Inject 0.5 mLs into the muscle tomorrow at 10 am for 1 dose.   [DISCONTINUED] sodium chloride (OCEAN) 0.65 % SOLN nasal spray Place 2 sprays into both nostrils daily as needed for congestion.   Allergies  Allergen Reactions   Latex Rash        Shellfish Allergy Anaphylaxis    Hard shellfish/swelling in throat   Sulfa Antibiotics Anaphylaxis    Swelling in throat.   Sulfonamide Derivatives Anaphylaxis    Swelling in throat.   Watermelon  [Citrullus Vulgaris] Other (See Comments)    Throat itchy   Penicillins Itching   Soy Allergy     Diarrhea/upset stomach     Tomato Itching    Throat itchy - also Raw carrots   Other Swelling   Recent Results (from the past 2160 hour(s))  Allergens w/Total IgE Area 2     Status: Abnormal   Collection Time: 01/16/22 12:11 PM  Result Value Ref Range   Class Description Allergens Comment     Comment: (NOTE)    Levels of Specific IgE       Class  Description of Class    ---------------------------  -----  --------------------                   < 0.10         0         Negative           0.10 -    0.31         0/I       Equivocal/Low           0.32 -    0.55         I         Low           0.56 -    1.40         II        Moderate           1.41 -    3.90         III       High           3.91 -   19.00         IV        Very High          19.01 -  100.00         V         Very High                  >100.00         VI        Very High  IgE (Immunoglobulin E), Serum 536 (H) 6 - 495 IU/mL   D Pteronyssinus IgE <0.10 Class 0 kU/L   D Farinae IgE 0.15 (A) Class 0/I kU/L   Cat Dander IgE 0.22 (A) Class 0/I kU/L   Dog Dander IgE 0.45 (A) Class I kU/L   [place] Grass IgE 23.00 (A) Class V kU/L   Timothy Grass IgE 7.33 (A) Class IV kU/L   Johnson Grass IgE 7.15 (A) Class IV kU/L   Cockroach, German IgE 0.31 (A) Class 0/I kU/L   Penicillium Chrysogen IgE <0.10 Class 0 kU/L   Cladosporium Herbarum IgE 0.10 (A) Class 0/I kU/L   Aspergillus Fumigatus IgE <0.10 Class 0 kU/L   Alternaria Alternata IgE 0.42 (A) Class I kU/L   Maple/Box Elder IgE 28.40 (A) Class V kU/L   Common Silver Wendee Copp IgE 50.10 (A) Class V kU/L   Cedar, [place] IgE 7.37 (A) Class IV kU/L   Oak, White IgE 45.00 (A) Class V kU/L   Elm, American IgE 31.10 (A) Class V kU/L   Cottonwood IgE 18.70 (A) Class IV kU/L   Pecan, Hickory IgE 33.90 (A) Class V kU/L   White Mulberry IgE 0.44 (A) Class I kU/L   Ragweed, Short IgE  44.80 (A) Class V kU/L   Pigweed, Rough IgE 28.30 (A) Class V kU/L   Sheep Sorrel IgE Qn 35.50 (A) Class V kU/L   Mouse Urine IgE <0.10 Class 0 kU/L    Comment: (NOTE) Performed At: [facility] [place], [place] [id number] Rush Farmer MD WC:[id number]   CBC with Differential/Platelet     Status: Abnormal   Collection Time: 01/16/22 12:11 PM  Result Value Ref Range   WBC 6.5 4.0 - 10.5 K/uL   RBC 4.82 3.87 - 5.11 MIL/uL   Hemoglobin 15.0 12.0 - 15.0 g/dL   HCT 46.4 (H) 36.0 - 46.0 %   MCV 96.3 80.0 - 100.0 fL   MCH 31.1 26.0 - 34.0 pg   MCHC 32.3 30.0 - 36.0 g/dL   RDW 13.6 11.5 - 15.5 %   Platelets 160 150 - 400 K/uL   nRBC 0.0 0.0 - 0.2 %   Neutrophils Relative % 41 %   Neutro Abs 2.7 1.7 - 7.7 K/uL   Lymphocytes Relative 49 %   Lymphs Abs 3.2 0.7 - 4.0 K/uL   Monocytes Relative 7 %   Monocytes Absolute 0.5 0.1 - 1.0 K/uL   Eosinophils Relative 2 %   Eosinophils Absolute 0.2 0.0 - 0.5 K/uL   Basophils Relative 1 %   Basophils Absolute 0.0 0.0 - 0.1 K/uL   Immature Granulocytes 0 %   Abs Immature Granulocytes 0.02 0.00 - 0.07 K/uL    Comment: Performed at [facility], [address] [place], [place] [address]  Basic metabolic panel     Status: Abnormal   Collection Time: 03/11/22  1:33 PM  Result Value Ref Range   Sodium 142 135 - 145 mmol/L   Potassium 3.8 3.5 - 5.1 mmol/L   Chloride 112 (H) 98 - 111 mmol/L   CO2 23 22 - 32 mmol/L   Glucose, Bld 109 (H) 70 - 99 mg/dL    Comment: Glucose reference range applies only to samples taken after fasting for at least 8 hours.   BUN 15 8 - 23 mg/dL   Creatinine, Ser 1.04 (H) 0.44 - 1.00 mg/dL   Calcium 9.2 8.9 - 10.3 mg/dL   GFR, Estimated >60 >60 mL/min  Comment: (NOTE) Calculated using the CKD-EPI Creatinine Equation (2021)    Anion gap 7 5 - 15    Comment: Performed at [facility], [address] [place], [place] [address]  CBC     Status: Abnormal   Collection Time:  03/11/22  1:33 PM  Result Value Ref Range   WBC 4.5 4.0 - 10.5 K/uL   RBC 5.07 3.87 - 5.11 MIL/uL   Hemoglobin 15.5 (H) 12.0 - 15.0 g/dL   HCT 48.5 (H) 36.0 - 46.0 %   MCV 95.7 80.0 - 100.0 fL   MCH 30.6 26.0 - 34.0 pg   MCHC 32.0 30.0 - 36.0 g/dL   RDW 14.1 11.5 - 15.5 %   Platelets 152 150 - 400 K/uL   nRBC 0.0 0.0 - 0.2 %    Comment: Performed at [facility], [place], [place] [address]  Troponin I (High Sensitivity)     Status: None   Collection Time: 03/11/22  1:33 PM  Result Value Ref Range   Troponin I (High Sensitivity) 13 <18 ng/L    Comment: (NOTE) Elevated high sensitivity troponin I (hsTnI) values and significant  changes across serial measurements may suggest ACS but many other  chronic and acute conditions are known to elevate hsTnI results.  Refer to the "Links" section for chest pain algorithms and additional  guidance. Performed at [facility], [place], [place] [address]   Troponin I (High Sensitivity)     Status: None   Collection Time: 03/11/22  6:01 PM  Result Value Ref Range   Troponin I (High Sensitivity) 14 <18 ng/L    Comment: (NOTE) Elevated high sensitivity troponin I (hsTnI) values and significant  changes across serial measurements may suggest ACS but many other  chronic and acute conditions are known to elevate hsTnI results.  Refer to the "Links" section for chest pain algorithms and additional  guidance. Performed at [facility], [address] [place], [place] [address]    Objective  Body mass index is 34.65 kg/m. Wt Readings from Last 3 Encounters:  03/20/22 208 lb 3.2 oz (94.4 kg)  03/18/22 205 lb (93 kg)  03/12/22 205 lb 12.8 oz (93.4 kg)   Temp Readings from Last 3 Encounters:  03/20/22 98.7 F (37.1 C) (Oral)  03/12/22 98.4 F (36.9 C)  03/11/22 98.4 F (36.9 C) (Oral)   BP Readings from Last 3 Encounters:  03/20/22 124/70  03/18/22 110/76  03/12/22 118/70    Pulse Readings from Last 3 Encounters:  03/20/22 71  03/18/22 76  03/12/22 88    Physical Exam Vitals and nursing note reviewed.  Constitutional:      Appearance: Normal appearance. She is well-developed and well-groomed.  HENT:     Head: Normocephalic and atraumatic.  Eyes:     Conjunctiva/sclera: Conjunctivae normal.     Pupils: Pupils are equal, round, and reactive to light.  Cardiovascular:     Rate and Rhythm: Normal rate and regular rhythm.     Heart sounds: Normal heart sounds. No murmur heard. Pulmonary:     Effort: Pulmonary effort is normal.     Breath sounds: Normal breath sounds.  Abdominal:     General: Abdomen is flat. Bowel sounds are normal.     Tenderness: There is no abdominal tenderness.  Musculoskeletal:        General: No tenderness.  Skin:    General: Skin is warm and dry.  Neurological:  General: No focal deficit present.     Mental Status: She is alert and oriented to person, place, and time. Mental status is at baseline.     Cranial Nerves: Cranial nerves 2-12 are intact.     Motor: Motor function is intact.     Coordination: Coordination is intact.     Gait: Gait is intact.  Psychiatric:        Attention and Perception: Attention and perception normal.        Mood and Affect: Mood and affect normal.        Speech: Speech normal.        Behavior: Behavior normal. Behavior is cooperative.        Thought Content: Thought content normal.        Cognition and Memory: Cognition and memory normal.        Judgment: Judgment normal.     Assessment  Plan  Acute recurrent frontal sinusitis - Plan: fluticasone (FLONASE) 50 MCG/ACT nasal spray, levofloxacin (LEVAQUIN) 750 MG tablet Nasal saline   Allergic rhinitis, asthma - Plan: fluticasone (FLONASE) 50 MCG/ACT nasal spray Consider biologic with with Dr. Halford Chessman for asthma allergies fasenra/dupixent   PND (post-nasal drip) - Plan: sodium chloride (OCEAN) 0.65 % SOLN nasal spray   Yeast  infection - Plan: fluconazole (DIFLUCAN) 150 MG tablet prn with Abx  SUI (stress urinary incontinence, female) - Plan: Ambulatory referral to Obstetrics / Gynecology Continue pelvic pt  Routine cervical smear - Plan: Ambulatory referral to Obstetrics / Gynecology   HM Flu shot will get at pharmacy  Consider shingrix in future at pharmacy given Rx 01/10/21  pna 23 had 03/10/16 consider in future  covid 2/2  pfizer consider booster disc prev Tdap 05/05/2019 Consider prevnar 53 in future here of pharmacy    S/p hysterectomy no cervix and f/u OB/GYN h/o abnormal pap s/p cryo -pap 05/08/15 neg pap will  Repeat In 5 years sent referral Dr. Glennon Mac pap and SUI   Colonoscopy Dr. Allen Norris 01/2017 polyps, gastritis, diverticulosis est Dr. Allen Norris f/u 01/27/2022 stress test needed 1st   11/08/21 negative mammogram ordered 10/2022   Mammogram 10/13/2018 negative re ordered sch 09/01/19 left dx mammogram As of 3 or 10/28/19 utd benign cyst likely right breast rec [place] in 6 months to f/u ordered and sch 08/08/20 RECOMMENDATION: Bilateral diagnostic mammogram and RIGHT breast ultrasound in 3 months to ensure 1 year evaluation as per patient desire.   Mammogram 4//13/22 dx mammogram neg repeat in 1 year b/l diagnostic mammogram 1 year and right breast US due 11/06/21 neg ordered 2024     Rec healthy diet and exercise       Provider: Dr. Olivia Mackie [name]-Internal Medicine

## 2022-03-20 NOTE — Patient Instructions (Addendum)
Ask Dr. Halford Chessman do you qualify for fasenra or dupixent injections for allergies/asthma   Colonoscopy is due now Dr. Allen Norris  MD Physician   Primary Contact Information  Phone Fax E-mail Address  [phone] Not available [place] [address]     Specialties     Gastroenterology        Get stress test 1st   Warm prune juice, miralax and colace as needed    Call back for flu shot here or get a [facility] then pre

## 2022-03-21 LAB — HM DIABETES EYE EXAM

## 2022-03-24 ENCOUNTER — Telehealth: Payer: Self-pay | Admitting: Cardiovascular Disease

## 2022-03-24 ENCOUNTER — Encounter: Payer: Self-pay | Admitting: Internal Medicine

## 2022-03-24 DIAGNOSIS — I509 Heart failure, unspecified: Secondary | ICD-10-CM | POA: Diagnosis not present

## 2022-03-24 DIAGNOSIS — I25119 Atherosclerotic heart disease of native coronary artery with unspecified angina pectoris: Secondary | ICD-10-CM | POA: Diagnosis not present

## 2022-03-24 DIAGNOSIS — J449 Chronic obstructive pulmonary disease, unspecified: Secondary | ICD-10-CM | POA: Diagnosis not present

## 2022-03-24 DIAGNOSIS — D6869 Other thrombophilia: Secondary | ICD-10-CM | POA: Diagnosis not present

## 2022-03-24 DIAGNOSIS — E261 Secondary hyperaldosteronism: Secondary | ICD-10-CM | POA: Diagnosis not present

## 2022-03-24 DIAGNOSIS — I4891 Unspecified atrial fibrillation: Secondary | ICD-10-CM | POA: Diagnosis not present

## 2022-03-24 DIAGNOSIS — G4733 Obstructive sleep apnea (adult) (pediatric): Secondary | ICD-10-CM | POA: Diagnosis not present

## 2022-03-24 DIAGNOSIS — I700 Atherosclerosis of aorta: Secondary | ICD-10-CM | POA: Diagnosis not present

## 2022-03-24 DIAGNOSIS — R69 Illness, unspecified: Secondary | ICD-10-CM | POA: Diagnosis not present

## 2022-03-24 DIAGNOSIS — I259 Chronic ischemic heart disease, unspecified: Secondary | ICD-10-CM | POA: Diagnosis not present

## 2022-03-24 DIAGNOSIS — G4710 Hypersomnia, unspecified: Secondary | ICD-10-CM | POA: Diagnosis not present

## 2022-03-24 DIAGNOSIS — E1122 Type 2 diabetes mellitus with diabetic chronic kidney disease: Secondary | ICD-10-CM | POA: Diagnosis not present

## 2022-03-24 DIAGNOSIS — I130 Hypertensive heart and chronic kidney disease with heart failure and stage 1 through stage 4 chronic kidney disease, or unspecified chronic kidney disease: Secondary | ICD-10-CM | POA: Diagnosis not present

## 2022-03-24 NOTE — Telephone Encounter (Signed)
Pt had echo and lexiscan myoview ordered 09/06/21 by Dr. [name] and pt was unable to schedule at that time. Pt has since seen Gerrie Nordmann, NP 03/18/22 and was advised to complete both tests when she is able.   Pt states she is ready to proceed with both echo and myoview. Instructions reviewed per below for myoview. Pt made aware that someone from our scheduling team will contact her to schedule both procedures. Per chart review, both orders do not expire until 09/06/22. Orders left in place. Forwarding to scheduling.   [place]  Your caregiver has ordered a Stress Test with nuclear imaging. The purpose of this test is to evaluate the blood supply to your heart muscle. This procedure is referred to as a "Non-Invasive Stress Test." This is because other than having an IV started in your vein, nothing is inserted or "invades" your body. Cardiac stress tests are done to find areas of poor blood flow to the heart by determining the extent of coronary artery disease (CAD). Some patients exercise on a treadmill, which naturally increases the blood flow to your heart, while others who are  unable to walk on a treadmill due to physical limitations have a pharmacologic/chemical stress agent called Lexiscan . This medicine will mimic walking on a treadmill by temporarily increasing your coronary blood flow.   Please note: these test may take anywhere between 2-4 hours to complete  PLEASE REPORT TO [facility] AT THE FIRST DESK WILL DIRECT YOU WHERE TO GO  Date of Procedure:_____________________________________  Arrival Time for Procedure:______________________________  Instructions regarding medication:   __X__ : Hold diabetes medication morning of procedure: Farxiga    __X__:  Hold other medications as follows: Lasix   PLEASE NOTIFY THE OFFICE AT LEAST 24 HOURS IN ADVANCE IF YOU ARE UNABLE TO KEEP YOUR APPOINTMENT.  [phone] AND  PLEASE NOTIFY NUCLEAR MEDICINE  AT [facility] AT LEAST 24 HOURS IN ADVANCE IF YOU ARE UNABLE TO KEEP YOUR APPOINTMENT. [phone]  How to prepare for your Myoview test:  Do not eat or drink after midnight No caffeine for 24 hours prior to test No smoking 24 hours prior to test. Your medication may be taken with water.  If your doctor stopped a medication because of this test, do not take that medication. Ladies, please do not wear dresses.  Skirts or pants are appropriate. Please wear a short sleeve shirt. No perfume, cologne or lotion.

## 2022-03-24 NOTE — Telephone Encounter (Signed)
Pt is calling requesting call to discuss orders that she would like to schedule but have expired. She states she would like new test ordered.

## 2022-03-25 ENCOUNTER — Ambulatory Visit: Payer: Medicare HMO

## 2022-03-25 NOTE — Telephone Encounter (Signed)
Form filled out call pt to pick up and fill out top section

## 2022-03-26 ENCOUNTER — Telehealth: Payer: Self-pay

## 2022-03-26 NOTE — Telephone Encounter (Signed)
Spoke with pt in regards to handicap form being signed and ready for pick up. It has been placed in an envelope labeled and placed in designated folder.   [name], [name], MD  You 22 hours ago (5:19 PM)   TM Form filled out call pt to pick up and fill out top section         Note    You  [name], [name], MD 6 days ago    Pt wants a handicap plaque. Can you sign the form and we mail it to her or have her pick up will ask what pt prefers if you want to proceed with plaque.    Sheaffer-Poudrier, Lydia Guiles routed conversation to [facility]

## 2022-03-26 NOTE — Telephone Encounter (Signed)
She is established with ortho call for appt

## 2022-03-27 ENCOUNTER — Encounter: Payer: Self-pay | Admitting: Pulmonary Disease

## 2022-03-27 ENCOUNTER — Telehealth: Payer: Self-pay

## 2022-03-27 ENCOUNTER — Ambulatory Visit (INDEPENDENT_AMBULATORY_CARE_PROVIDER_SITE_OTHER): Payer: Medicare HMO | Admitting: Pulmonary Disease

## 2022-03-27 ENCOUNTER — Telehealth: Payer: Self-pay | Admitting: Pharmacist

## 2022-03-27 VITALS — BP 100/70 | HR 87 | Temp 97.7°F | Ht 65.0 in | Wt 209.8 lb

## 2022-03-27 DIAGNOSIS — J3089 Other allergic rhinitis: Secondary | ICD-10-CM | POA: Diagnosis not present

## 2022-03-27 DIAGNOSIS — J4550 Severe persistent asthma, uncomplicated: Secondary | ICD-10-CM

## 2022-03-27 NOTE — Telephone Encounter (Signed)
Xolair enrollment form has been completed and faxed to [place] team.

## 2022-03-27 NOTE — Telephone Encounter (Signed)
Spoke with pt in regards to her leg and what Dr. Olivia Mackie suggested:   [name], [name], MD  You 16 hours ago (5:25 PM)   TM She is established with ortho call for appt      Note     You  [name], [name], MD 18 hours ago (3:42 PM)    Pt notifed and said abx are working she feels a lot better but her left leg is bothering her she hurt it now the back of the knee bothering her.    Pt stated she had an appt with ortho in Sept 20th she expressed she dont think she can wait that long to been seen for leg issue. I told pt she can always call them everyday and see if they have any cancellations and can work her in. Pt verbalized understanding and said she will do that.

## 2022-03-27 NOTE — Telephone Encounter (Signed)
Pt has been notified.

## 2022-03-27 NOTE — Telephone Encounter (Signed)
Paperwork received. Will started BIV in separate telephone encounter. Thanks!

## 2022-03-27 NOTE — Patient Instructions (Signed)
Will start approval process for Xolair  Follow up in 4 months

## 2022-03-27 NOTE — Telephone Encounter (Signed)
Submitted a Prior Authorization request to [facility] for XOLAIR via CoverMyMeds. Will update once we receive a response.  Dose should be '375mg'$  every 2 weeks  Pre-treatment wt = 95.2kg Pre-treatment IgE = 536  Key: [id number]  Knox Saliva, PharmD, MPH, BCPS, CPP Clinical Pharmacist (Rheumatology and Pulmonology)

## 2022-03-27 NOTE — Progress Notes (Signed)
[name], [name], [name]  Chief Complaint  Patient presents with   Follow-up    Constitutional:  BP 100/70 (BP Location: Left Arm, Patient Position: Sitting, Cuff Size: Large)   Pulse 87   Temp 97.7 F (36.5 C) (Oral)   Ht '5\' 5"'$  (1.651 m)   Wt 209 lb 12.8 oz (95.2 kg)   SpO2 97%   BMI 34.91 kg/m   Past Medical History:  HLD, GERD, Depression, HTN, Anxiety, Depression, Allergies, OA, Pituitary adenoma, Combined CHF, CAD s/p CABG, DM type 2, HA, Hep B, Cervical cancer, PAD, Colon polyps  Past Surgical History:  Her  has a past surgical history that includes Coronary artery bypass graft (2012); Coronary stent placement (2003); Coronary stent placement (2007); Femoral artery stent (10/2010); Coronary angioplasty; Colonoscopy (2008); Colonoscopy with propofol (N/A, 02/06/2015); Esophagogastroduodenoscopy (egd) with propofol (N/A, 02/06/2015); Colonoscopy with propofol (N/A, 01/27/2017); Esophagogastroduodenoscopy (egd) with propofol (N/A, 01/27/2017); Tubal ligation; Knee arthroscopy with medial menisectomy (Right, 08/20/2017); Abdominal hysterectomy; Cholecystectomy (1986); Posterior cervical laminectomy (N/A, 03/08/2018); Tonsillectomy; Total knee arthroplasty (Right, 04/14/2019); Lorraine Esophagogastroduodenoscopy (egd) with propofol (N/A, 09/01/2019).  Brief Summary:  [name] is a 61 y.o. female with former smoker with allergic asthma Lorraine rhinitis, Lorraine obstructive sleep apnea.      Subjective:   She was started on ABx again for flare up.  Still has sinus congestion Lorraine post nasal drip.  Cough with thick white sputum.  Intermittent wheezing.    Physical Exam:   Appearance - well kempt   ENMT - no sinus tenderness, no oral exudate, no LAN, Mallampati 3 airway, no stridor  Respiratory - equal breath sounds bilaterally, no wheezing or rales  CV - s1s2 regular rate Lorraine rhythm, no murmurs  Ext - no clubbing, no edema  Skin - no rashes  Psych - normal  mood Lorraine affect      Ward testing:  PFT 11/07/10 >> FEV1 2.73 (110%), FEV1% 80, TLC 5.41 (101%), DLCO 68% PFT 05/16/16 >> FEV1 1.98 (88%), FEV1% 79, TLC 5.10 (98%), DLCO 63% RAST 01/16/22 >> IgE 536, multiple allergens  Chest Imaging:  CT angio chest 06/16/17 >> atherosclerosis, s/p CABG, ATX CT angio chest 03/11/22 >> RLL ATX  Sleep Tests:  PSG 05/06/16 >> AHI 6.8, SpO2 low 87% HST 01/11/19 >> AHI 26.7, SpO2 low 62% HST 04/25/21 >> AHI 13.1, SpO2 low 85%  Cardiac Tests:  Echo 04/24/17 >> EF 40 to 45%, grade 1 DD  Social History:  She  reports that she quit smoking about 11 years ago. Her smoking use included cigarettes. She has a 76.00 pack-year smoking history. She has never used smokeless tobacco. She reports that she does not drink alcohol Lorraine does not use drugs.  Family History:  Her family history includes Acute myelogenous leukemia in her grandson; Arthritis in her sister; Brain cancer in her sister; Breast cancer (age of onset: 15) in her mother; Cancer in her father Lorraine mother; Colon cancer (age of onset: 63) in her father; Diabetes in her brother, father, Lorraine sister; Glaucoma in her father; Heart disease in her father; Heart failure in her father; Hypertension in her brother, father, Lorraine sister; Kidney disease in her sister; Other in her father Lorraine mother.     Discussion:  She has severe persistent allergic asthma Lorraine rhinitis.  She has frequent exacerbations requiring prednisone Lorraine antibiotics.  She is on multiple controller medications with trelegy (ICS/LABA/LAMA) Lorraine montelukast.  Based on her RAST panel Lorraine IgE level, I think  she is an excellent candidate for [employer].  Assessment/Plan:   Allergic asthma. - will start approval process for xolair - continue trelegy 100 one puff daily Lorraine singulair 10 mg qhs - prn albuterol  Allergic rhinitis. - nasal irrigation, flonase, azelastine, singulair, xyzal  Obstructive sleep apnea. - she is compliant with CPAP Lorraine  benefits from therapy when her sinuses are an issues - uses [employer] for her DME - continue auto CPAP 5 to 15 cm H2O  CAD s/p CABG, HTN, HLD, Ischemic cardiomyopathy with chronic combined CHF. - followed by Dr. Ida Rogue with [facility]  Pituitary tumor. - followed by Dr. Deetta Perla with neurosurgery  Time Spent Involved in Patient Ward on Day of Examination:  36 minutes  Follow up:   There are no Patient Instructions on file for this visit.  Medication List:   Allergies as of 03/27/2022       Reactions   Latex Rash      Shellfish Allergy Anaphylaxis   Hard shellfish/swelling in throat   Sulfa Antibiotics Anaphylaxis   Swelling in throat.   Sulfonamide Derivatives Anaphylaxis   Swelling in throat.   Watermelon [citrullus Vulgaris] Other (See Comments)   Throat itchy   Penicillins Itching   Soy Allergy    Diarrhea/upset stomach    Tomato Itching   Throat itchy - also Raw carrots   Other Swelling        Medication List        Accurate as of March 27, 2022 11:49 AM. If you have any questions, ask your nurse or doctor.          STOP taking these medications    levofloxacin 750 MG tablet Commonly known as: Levaquin Stopped by: Chesley Mires, MD       TAKE these medications    albuterol 108 (90 Base) MCG/ACT inhaler Commonly known as: VENTOLIN HFA Inhale 2 puffs into the lungs every 6 (six) hours as needed for wheezing or shortness of breath.   aspirin EC 81 MG tablet Take 1 tablet (81 mg total) by mouth daily.   Azelastine HCl 0.15 % Soln Place 2 sprays into both nostrils daily as needed (allergies).   benzonatate 100 MG capsule Commonly known as: TESSALON Take 1 capsule (100 mg total) by mouth 3 (three) times daily as needed for cough.   clobetasol cream 0.05 % Commonly known as: TEMOVATE Apply 1 application. topically 2 (two) times daily. Prn left elbow   cyclobenzaprine 5 MG tablet Commonly known as: FLEXERIL Take 1-2 tablets  (5-10 mg total) by mouth daily as needed for muscle spasms.   dapagliflozin propanediol 10 MG Tabs tablet Commonly known as: Farxiga Take 1 tablet (10 mg total) by mouth daily before breakfast.   dicyclomine 10 MG capsule Commonly known as: Bentyl Take 1 capsule (10 mg total) by mouth 3 (three) times daily before meals. As needed for abdominal pain   esomeprazole 40 MG capsule Commonly known as: NEXIUM Take 1 capsule (40 mg total) by mouth daily.   ezetimibe 10 MG tablet Commonly known as: ZETIA Take 1 tablet (10 mg total) by mouth daily.   fluticasone 50 MCG/ACT nasal spray Commonly known as: FLONASE Place 2 sprays into both nostrils daily. Prn nasal congestion   furosemide 20 MG tablet Commonly known as: LASIX Qd Lorraine 2nd tablet prn for edema   Gemtesa 75 MG Tabs Generic drug: Vibegron Take 75 mg by mouth daily. D/c myrbetriq   hydrOXYzine 25 MG tablet Commonly  known as: ATARAX TAKE 1 TO 2 TABLETS BY MOUTH ONCE DAILY AS NEEDED   ipratropium 0.06 % nasal spray Commonly known as: ATROVENT Place 2 sprays into both nostrils 4 (four) times daily.   ipratropium-albuterol 0.5-2.5 (3) MG/3ML Soln Commonly known as: DUONEB USE 1 AMPULE IN NEBULIZER TWICE DAILY AS NEEDED FOR WHEEZING OR SHORTNESS OF BREATH   isosorbide mononitrate 30 MG 24 hr tablet Commonly known as: IMDUR Take 1 tablet by mouth twice daily   levocetirizine 5 MG tablet Commonly known as: Xyzal Take 1 tablet (5 mg total) by mouth at bedtime as needed for allergies.   linaclotide 72 MCG capsule Commonly known as: Linzess TAKE 1 CAPSULE BY MOUTH ONCE DAILY BEFORE BREAKFAST   meclizine 25 MG tablet Commonly known as: ANTIVERT TAKE 1 TABLET BY MOUTH TWICE DAILY AS NEEDED FOR DIZZINESS   metoprolol succinate 50 MG 24 hr tablet Commonly known as: TOPROL-XL TAKE 1 TABLET BY MOUTH ONCE DAILY WITH OR IMMEDIATELY FOLLOWING A MEAL   montelukast 10 MG tablet Commonly known as: SINGULAIR Take 1 tablet (10 mg  total) by mouth at bedtime.   nitroGLYCERIN 0.4 MG SL tablet Commonly known as: NITROSTAT DISSOLVE ONE TABLET UNDER THE TONGUE EVERY 5 MINUTES AS NEEDED FOR CHEST PAIN.  DO NOT EXCEED A TOTAL OF 3 DOSES IN 15 MINUTES   nystatin 100000 UNIT/ML suspension Commonly known as: MYCOSTATIN Take 5 mLs (500,000 Units total) by mouth 4 (four) times daily. What changed:  when to take this reasons to take this   olopatadine 0.1 % ophthalmic solution Commonly known as: PATANOL Place 1 drop into both eyes 2 (two) times daily.   ondansetron 4 MG tablet Commonly known as: Zofran Take 1 tablet (4 mg total) by mouth every 8 (eight) hours as needed for nausea or vomiting.   OneTouch Delica Lancets 55D Misc USE TO CHECK GLUCOSE IN THE MORNING Lorraine AT BEDTIME   OneTouch Verio test strip Generic drug: glucose blood USE 1 STRIP TO CHECK GLUCOSE TWICE DAILY ONCE  IN  THE  MORNING  Lorraine  ONCE  AT  BEDTIME   PARoxetine 20 MG tablet Commonly known as: PAXIL Take 1 tablet by mouth once daily   potassium chloride 10 MEQ tablet Commonly known as: KLOR-CON M Take 1 tablet (10 mEq total) by mouth daily.   pregabalin 75 MG capsule Commonly known as: LYRICA Take 1 capsule by mouth twice daily   Repatha SureClick 322 MG/ML Soaj Generic drug: Evolocumab INJECT '140MG'$  INTO THE SKIN ONCE EVERY 14 DAYS   rosuvastatin 20 MG tablet Commonly known as: CRESTOR TAKE 1 TABLET BY MOUTH AT BEDTIME   sodium chloride 0.65 % Soln nasal spray Commonly known as: OCEAN Place 2 sprays into both nostrils daily as needed for congestion.   traZODone 50 MG tablet Commonly known as: DESYREL Take 0.5-1 tablets (25-50 mg total) by mouth at bedtime as needed for sleep.   Trelegy Ellipta 100-62.5-25 MCG/ACT Aepb Generic drug: Fluticasone-Umeclidin-Vilant Inhale 1 puff into the lungs daily. Rinse mouth   Vitamin B-12 1000 MCG Subl Place 0.001 tablets (1 mcg total) under the tongue daily.   Xarelto 2.5 MG Tabs  tablet Generic drug: rivaroxaban Take 1 tablet by mouth twice daily        Signature:  Chesley Mires, MD [place] Pager - [phone] 03/27/2022, 11:49 AM

## 2022-03-28 ENCOUNTER — Other Ambulatory Visit (HOSPITAL_COMMUNITY): Payer: Self-pay

## 2022-03-28 NOTE — Addendum Note (Signed)
Addended by: Nestor Ramp on: 03/28/2022 04:12 PM   Modules accepted: Orders

## 2022-03-28 NOTE — Telephone Encounter (Signed)
Received notification from [facility] regarding a prior authorization for XOLAIR. Authorization has been APPROVED from 03/27/22 to 07/27/22.   Per test claim, copay for 28 days supply is $0 for 74m of '150mg'$  syringe and 158m(2 syringes) of '75mg'$  syringe  Patient can fill through [phone] Authorization # [id number] will need Epipen at all appointments (patient has no copay for this per test claim). Will need to complete first three doses in clinic. First dose requires 2 hours of monitoring. Second and third doses require 30 minutes monitoring.  DeKnox SalivaPharmD, MPH, BCPS, CPP Clinical Pharmacist (Rheumatology and Pulmonology)

## 2022-04-01 ENCOUNTER — Ambulatory Visit
Admission: RE | Admit: 2022-04-01 | Discharge: 2022-04-01 | Disposition: A | Discharge status: Home / Self Care | Payer: Medicare HMO | Source: Ambulatory Visit | Attending: Internal Medicine | Admitting: Internal Medicine

## 2022-04-01 DIAGNOSIS — M25511 Pain in right shoulder: Secondary | ICD-10-CM | POA: Diagnosis not present

## 2022-04-01 DIAGNOSIS — G8929 Other chronic pain: Secondary | ICD-10-CM | POA: Diagnosis not present

## 2022-04-03 ENCOUNTER — Encounter
Admission: RE | Admit: 2022-04-03 | Discharge: 2022-04-03 | Disposition: A | Discharge status: Home / Self Care | Payer: Medicare HMO | Source: Ambulatory Visit | Attending: Cardiovascular Disease | Admitting: Cardiovascular Disease

## 2022-04-03 ENCOUNTER — Ambulatory Visit: Payer: Medicare HMO | Attending: Internal Medicine

## 2022-04-03 DIAGNOSIS — I208 Other forms of angina pectoris: Secondary | ICD-10-CM | POA: Insufficient documentation

## 2022-04-03 DIAGNOSIS — I25118 Atherosclerotic heart disease of native coronary artery with other forms of angina pectoris: Secondary | ICD-10-CM | POA: Insufficient documentation

## 2022-04-03 DIAGNOSIS — R278 Other lack of coordination: Secondary | ICD-10-CM | POA: Insufficient documentation

## 2022-04-03 DIAGNOSIS — M6281 Muscle weakness (generalized): Secondary | ICD-10-CM | POA: Insufficient documentation

## 2022-04-03 DIAGNOSIS — M6289 Other specified disorders of muscle: Secondary | ICD-10-CM | POA: Insufficient documentation

## 2022-04-03 MED ORDER — TECHNETIUM TC 99M TETROFOSMIN IV KIT
10.0000 | PACK | Freq: Once | INTRAVENOUS | Status: AC | PRN
Start: 2022-04-03 — End: 2022-04-03
  Administered 2022-04-03: 10.27 via INTRAVENOUS

## 2022-04-03 MED ORDER — TECHNETIUM TC 99M TETROFOSMIN IV KIT
30.6700 | PACK | Freq: Once | INTRAVENOUS | Status: AC | PRN
Start: 2022-04-03 — End: 2022-04-03
  Administered 2022-04-03: 30.67 via INTRAVENOUS

## 2022-04-03 MED ORDER — REGADENOSON 0.4 MG/5ML IV SOLN
0.4000 mg | Freq: Once | INTRAVENOUS | Status: AC
  Administered 2022-04-03: 0.4 mg via INTRAVENOUS

## 2022-04-04 LAB — NM MYOCAR MULTI W/SPECT W/WALL MOTION / EF
LV dias vol: 113 mL (ref 46–106)
LV sys vol: 56 mL
Nuc Stress EF: 50 %
Peak HR: 88 {beats}/min
Percent HR: 56 %
Rest HR: 59 {beats}/min
Rest Nuclear Isotope Dose: 10.3 mCi
SDS: 9
SRS: 4
SSS: 13
ST Depression (mm): 0 mm
Stress Nuclear Isotope Dose: 30.7 mCi
TID: 0.74

## 2022-04-06 ENCOUNTER — Other Ambulatory Visit: Payer: Self-pay | Admitting: Internal Medicine

## 2022-04-06 DIAGNOSIS — K219 Gastro-esophageal reflux disease without esophagitis: Secondary | ICD-10-CM

## 2022-04-07 ENCOUNTER — Telehealth: Payer: Self-pay | Admitting: Cardiovascular Disease

## 2022-04-07 NOTE — Telephone Encounter (Signed)
I spoke with the patient regarding her stress test results as stated below per Dr. [name]. The patient voices understanding of these results.  I inquired if she his currently having any chest pain symptoms at this time and she advised, "since they opened that left lung, I feel good, not short of breath." She advised she will be sure to call if she experiencing any concerning symptoms.  I have advised her I will update Dr. [name] of the above and encouraged her to keep her echo appointment on 04/17/22.  The patient again voiced understanding and is agreeable.

## 2022-04-07 NOTE — Telephone Encounter (Signed)
Lorraine Merritts, MD  04/05/2022  1:16 PM EDT     Stress test images were poor, Poor uptake of the radioisotope If not having anginal symptoms, no further testing needed

## 2022-04-08 ENCOUNTER — Other Ambulatory Visit: Payer: Self-pay | Admitting: Cardiovascular Disease

## 2022-04-08 DIAGNOSIS — R609 Edema, unspecified: Secondary | ICD-10-CM

## 2022-04-08 MED ORDER — EPINEPHRINE 0.3 MG/0.3ML IJ SOAJ: 0.3000 mg | Freq: Once | INTRAMUSCULAR | 5 refills | Status: DC

## 2022-04-08 MED ORDER — EPINEPHRINE 0.3 MG/0.3ML IJ SOAJ: INTRAMUSCULAR | 5 refills | Status: DC

## 2022-04-08 NOTE — Telephone Encounter (Signed)
Spoke with patient regarding Xolair approval through insurance. She states she had discussed with Dr. Halford Chessman that she'd like to wait until after her ortho surgery and cardiac stress test is completed prior to initiating Xolair.  I reviewed that the first three injections must be completed at [facility] location. The first injection requires 2 hour monitoring period. The second and third doses require a 30-minute monitoring period. Reviewed requirement for Epipen. Rx sent in today to local pharmacy in preparation. She states she will able to come to [facility] location but will need her sister to drive her.  She requested f/u call at the end of September/beginning of October 2023 regarding scheduling Xolair visits  Knox Saliva, PharmD, MPH, BCPS, CPP Clinical Pharmacist (Rheumatology and Pulmonology)

## 2022-04-10 ENCOUNTER — Ambulatory Visit: Payer: Medicare HMO

## 2022-04-10 DIAGNOSIS — R278 Other lack of coordination: Secondary | ICD-10-CM | POA: Diagnosis not present

## 2022-04-10 DIAGNOSIS — M6289 Other specified disorders of muscle: Secondary | ICD-10-CM

## 2022-04-10 DIAGNOSIS — M6281 Muscle weakness (generalized): Secondary | ICD-10-CM | POA: Diagnosis not present

## 2022-04-10 NOTE — Therapy (Signed)
OUTPATIENT PHYSICAL THERAPY FEMALE PELVIC TREATMENT   Patient Name: [name] MRN: [id number] DOB:[birth year], 61 y.o., female Today's Date: 04/10/2022   PT End of Session - 04/10/22 1150     Visit Number 5    Number of Visits 12    Date for PT Re-Evaluation 04/17/22    Authorization Type IE: 01/23/22    PT Start Time 1147    PT Stop Time 1227    PT Time Calculation (min) 40 min    Activity Tolerance Patient tolerated treatment well             Past Medical History:  Diagnosis Date   Allergy    Anemia    Arthritis    Asthma    Bacterial vaginitis    Blood in stool    Brachial neuritis or radiculitis NOS    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 being followed by neurosurgery/eye doctor and pcp. monitoring size.causes sinus problems   Bronchitis    Cardiac arrhythmia    Cervical neck pain with evidence of disc disease    C5/6 disease, MRI done late 2012 - no records available   Chronic combined systolic and diastolic CHF (congestive heart failure) ([place])    a. 08/2010 Echo: mildly reduced EF 40-45%, mild diffuse hypokinesis; b. 06/2016 Echo: EF 50%, no rwma, mild to mod TR; c. 03/2017 Echo: EF 40-45%, Gr1 DD (prior echo reviewed and EF felt to be lower than reported).   Chronic pain    Cocaine abuse, in remission (HCC)    clean x 24 years   Colon polyps    COPD (chronic obstructive pulmonary disease) ([place])    a. 12/2018 PFT: No obvious obst/restrictive dzs.   Coronary artery disease    a. PCI of LCX 2003; b. PCI of the LAD 2012 with a (2.5 x 8 mm BMS);  c.s/p CABG 4/12: L-LAD, VG-Dx, VG-OM, VG-RCA (Dr. Prescott Gum);  d. 01/2012 MV: inf infarct, attenuation, no ischemia; e. 02/2017 MV: signifi attenuation artifact. Fixed basal antlat/inflat scar vs artifact. Reversible apical lat and mid antlat defect - ? atten vs ischemia. F/u echo w/o wma->Med rx.   Depression    Diabetes mellitus without complication ([place])    Family history of colon cancer     Generalized headaches    frequent   GERD (gastroesophageal reflux disease)    Headache    Heart murmur    Hematochezia    Hepatitis    history of hepatitis b   History of cervical cancer    s/p cryotherapy   History of drug abuse ([place])    cocaine, marijuana, clean since 1989   History of hepatitis B    from eating undercooked liver   History of MI (myocardial infarction)    Hyperlipidemia    Hypertension    Ischemic cardiomyopathy    a. 03/2017 Echo: EF 40-45%, Gr1 DD.   Myocardial infarction ([place]) 2003, 2012   PAD (peripheral artery disease) ([place])    a. s/p Right SFA atherectomy and PTA 01/15/11; b. 07/2018 ABI: R 1.02, L 1.09.   Pituitary mass ([place])    a. 12/2018 MRI Brain: Stable pituitary mass w/ 74m area of necrosis. Mass abuts R cavernous sinus w/o definite invasion.   Polyp of colon    Seasonal allergies    Sleep apnea    uses cpap   Smoking history    quit 07/2010   Thyroid disease    Urinary incontinence    Past  Surgical History:  Procedure Laterality Date   ABDOMINAL HYSTERECTOMY     2003   CHOLECYSTECTOMY  1986   COLONOSCOPY  2008   3 polyps   COLONOSCOPY WITH PROPOFOL N/A 02/06/2015   Procedure: COLONOSCOPY WITH PROPOFOL;  Surgeon: Lucilla Lame, MD;  Location: [facility] ENDOSCOPY;  Service: Endoscopy;  Laterality: N/A;   COLONOSCOPY WITH PROPOFOL N/A 01/27/2017   Procedure: COLONOSCOPY WITH PROPOFOL;  Surgeon: Lucilla Lame, MD;  Location: [facility] ENDOSCOPY;  Service: Endoscopy;  Laterality: N/A;   CORONARY ANGIOPLASTY     w/ stent placement x2   CORONARY ARTERY BYPASS GRAFT  2012   Dr [name]   CORONARY STENT PLACEMENT  2003   S/P MI   CORONARY STENT PLACEMENT  2007   [place]   ESOPHAGOGASTRODUODENOSCOPY (EGD) WITH PROPOFOL N/A 02/06/2015   Procedure: ESOPHAGOGASTRODUODENOSCOPY (EGD) WITH PROPOFOL;  Surgeon: Lucilla Lame, MD;  Location: [facility] ENDOSCOPY;  Service: Endoscopy;  Laterality: N/A;   ESOPHAGOGASTRODUODENOSCOPY (EGD) WITH PROPOFOL N/A 01/27/2017   Procedure:  ESOPHAGOGASTRODUODENOSCOPY (EGD) WITH PROPOFOL;  Surgeon: Lucilla Lame, MD;  Location: [facility] ENDOSCOPY;  Service: Endoscopy;  Laterality: N/A;   ESOPHAGOGASTRODUODENOSCOPY (EGD) WITH PROPOFOL N/A 09/01/2019   Procedure: ESOPHAGOGASTRODUODENOSCOPY (EGD) WITH PROPOFOL;  Surgeon: Lin Landsman, MD;  Location: [facility];  Service: Gastroenterology;  Laterality: N/A;   FEMORAL ARTERY STENT  10/2010   right sided (Dr. Burt Knack)   KNEE ARTHROSCOPY WITH MEDIAL MENISECTOMY Right 08/20/2017   Procedure: KNEE ARTHROSCOPY WITH MEDIAL  AND LATERAL MENISECTOMY;  Surgeon: Hessie Knows, MD;  Location: [facility] ORS;  Service: Orthopedics;  Laterality: Right;   POSTERIOR CERVICAL LAMINECTOMY N/A 03/08/2018   Procedure: POSTERIOR CERVICAL LAMINECTOMY-C7;  Surgeon: Deetta Perla, MD;  Location: [facility] ORS;  Service: Neurosurgery;  Laterality: N/A;   TONSILLECTOMY     TOTAL KNEE ARTHROPLASTY Right 04/14/2019   Procedure: RIGHT TOTAL KNEE ARTHROPLASTY;  Surgeon: Hessie Knows, MD;  Location: [facility] ORS;  Service: Orthopedics;  Laterality: Right;   TUBAL LIGATION     Patient Active Problem List   Diagnosis Date Noted   COPD exacerbation ([place]) 03/12/2022   PND (post-nasal drip) 07/24/2021   Chronic pain of inguinal region 07/24/2021   Thrush 04/19/2021   Skin lesion 04/19/2021   Vaginal itching 04/19/2021   Vitamin D deficiency 01/14/2021   Breast mass, right 01/14/2021   Annual physical exam 01/14/2021   Pruritus of both eyes 09/21/2020   Lipoma of right lower extremity 08/30/2020   Hypertension associated with diabetes ([place]) 07/24/2020   Abnormality of both breasts on screening mammogram 05/14/2020   Bilateral hip pain 04/19/2020   Insomnia 04/19/2020   Chronic low back pain 02/22/2020   Internal hemorrhoids 02/22/2020   Benign breast cyst in female, right 02/22/2020   OSA on CPAP 12/15/2019   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[place]) 12/02/2019   Sensorimotor neuropathy 12/02/2019   Irritable bowel syndrome with both  constipation and diarrhea 10/13/2019   Mild intermittent asthma 10/13/2019   PUD (peptic ulcer disease)    Status post total knee replacement using cement, right 04/14/2019   Overactive bladder 03/10/2019   Mild persistent asthma 11/29/2018   DDD (degenerative disc disease), cervical 11/26/2018   Chronic constipation 11/26/2018   Fatty liver 11/26/2018   Adrenal adenoma, left 11/26/2018   Allergic rhinitis 08/26/2018   Bunion of right foot 08/26/2018   Fall 05/14/2018   Anxiety and depression 04/05/2018   Cervical radiculopathy 02/24/2018   Numbness and tingling in right hand 02/24/2018   Chronic neck pain 12/18/2017   Type 2 diabetes  mellitus without complication, without long-term current use of insulin ([place]) 12/18/2017   Urinary incontinence 12/18/2017   Lumbar radiculopathy 12/18/2017   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   Abnormal feces    Benign neoplasm of ascending colon    Intractable vomiting with nausea    Acute esophagogastric ulcer    Gastritis without bleeding    Vasomotor flushing 11/24/2016   Morbid obesity ([place]) 10/29/2015   Back ache 06/19/2015   Colon polyp 06/19/2015   CCF (congestive cardiac failure) ([place]) 06/19/2015   Family history of colon cancer 06/19/2015   Orthostatic hypotension 04/30/2015   Hematochezia 01/11/2015   History of colonic polyps 01/11/2015   Atypical chest pain 01/20/2014   COPD with chronic bronchitis and emphysema ([place]) 01/03/2013   Right knee pain 12/20/2011   HTN (hypertension) 03/50/0938   Systolic CHF, chronic (HCC)    History of cervical cancer    Depression    GERD (gastroesophageal reflux disease)    Seasonal allergies    History of drug abuse ([place])    PVD (peripheral vascular disease) ([place]) 02/03/2011   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   Smoking history 12/16/2010   Hyperlipidemia 08/23/2010   Coronary artery disease involving native coronary artery of native heart without angina pectoris 08/23/2010   CLAUDICATION  08/23/2010   CAROTID BRUIT 08/23/2010   SHORTNESS OF BREATH 08/23/2010    PCP: [name], [name], MD   REFERRING PROVIDER: [name], [name], MD   REFERRING DIAG:  N32.81,N81.9 (ICD-10-CM) - Overactive bladder due to prolapse of female genital organ   THERAPY DIAG:  Pelvic floor dysfunction  Other lack of coordination  Muscle weakness (generalized)  Rationale for Evaluation and Treatment: Rehabilitation  ONSET DATE: A few years now  OCCUPATION/SOCIAL ACTIVITIES: Read, church, shopping, walking   PLOF: Independent  PERTINENT HISTORY/CHART REVIEW: CKD, CHF  Orland Mustard, MD (12/06/21) Plan Overactive bladder due to prolapse of female genital organ - Plan: Ambulatory referral to Physical Therapy, Vibegron (GEMTESA) 75 MG TABS, consider gyn or urogyn in the future if does not improve with PT   CHIEF CONCERN: Pt reports her kidneys are "so bad" and the urinary leakage has been happening for some years but this year has been the worst of it. Pt tries to perform kegels in order to hold her urine when she has the very strong urge to go to the bathroom (often). Pt is unable to make it to the bathroom without having to crunch over (forward trunk flexion) and squeeze her legs tight in order to not have leakage. However, most of the time the urinary leakage happens. Pt has to plan around where to go in the community and know where the bathroom is at all times. Pt has tried briefs and feels that her urine overflows and she has to constantly change her clothes. Pt has to change her clothes about 4-5x a day because she does not wear a brief. Pt also reports bedwetting occasionally because she cannot make it to the bathroom on time. Most recently, Pt has had bowel leakage with the urinary leakage as well. In some instances, she doesn't even notice the bowel leakage and notices when she wipes as her skin is very irritated and red. In regards to the prolapse, when Pt wipes  every time she does feel "something" there but Dr did not tell her about prolapse. Pt had a bedside commode and used for awhile but is currently not using. Pt is going to her kidney doctor today and stopped taking  the OAB medicine given because doctors are trying to figure out if the OAB medication is interfering with her kidneys/blood platelets.   LIVING ENVIRONMENT: Lives with: lives with their family Lives in: House/apartment   PATIENT GOALS: Pt be able to have control of her bladder, less leakage, not feeling a pressure at the vaginal opening. Pt would like to participate in sexual activity but fears about incontinence.     UROLOGICAL HISTORY Fluid intake: Yes: Water (tries a gallon a day per Dr.), fruit juice (pineapple, mango) , very occasionally a sprite Pain with urination: No Fully empty bladder: Yes Stream: Strong every time  Urgency: Yes:   Nocturia: 3-4x/night Frequency: "over 30x a day"  Leakage: Urge to void, Walking to the bathroom, Laughing, and Bending forward Pads: No because it hasn't helped and they cannot hold all the urine that comes out    GASTROINTESTINAL HISTORY  Pt has hx of constipation and is on medication ([place]) Pain with bowel movement: No Type of bowel movement ([place] Stool Chart): Type 5 and 7  Fully empty rectum: Yes:   Leakage: Yes: occasionally, but just recently, does not know if that is related to the medication because it is watery , Type 7 on underwear when she notices but not everyday (2x/week) , having to wear 2 underwears in case leakage happens especially when out in the community  Pads: No   SEXUAL HISTORY/FUNCTION Pain with intercourse: No Finger stimulation: no pain, but has leakage and cannot continue  Not engaging in intercourse currently due to incontinence   OBSTETRICAL HISTORY Vaginal deliveries: G4P2 Tearing: Yes: with first child    GYNECOLOGICAL HISTORY Hysterectomy: yes, partial hysterectomy  Pelvic Organ  Prolapse: see above in pertinent hx, not graded  Heaviness/pressure: sometimes    SUBJECTIVE:  Pt has had a lot of tests done the past couple weeks and why she has not been consistent with PFPT. Pt has been able to control her posture (increased forward flexion and B adduction of hips) when she gets the strong urge to go to the bathroom with diaphragmatic breathing.   PAIN:  Are you having pain? No NPRS scale: 0/10                                                                                                                                                                           OBJECTIVE:    COGNITION: Overall cognitive status: Within functional limits for tasks assessed     POSTURE:  Iliac crest height: L iliac crest  Pelvic obliquity: WNL   SENSATION:  Light touch:  L2-L4 intact, L5-S1 impaired, no dx of peripheral neuropathy in feet but has T2DM    LUMBAR AROM/PROM:   AROM (Normal range  in degrees) AROM  02/06/22  Flexion (65) WNL  Extension (25-30) WNL  Right lateral flexion (25) WNL  Left lateral flexion (25) WNL*  Right rotation (30) WNL  Left rotation (30) WNL*   (*= pain, Blank rows = not tested)  LOWER EXTREMITY ROM:    PROM (Normal range in degrees) Right 02/06/22 Left 02/06/22  Hip flexion (0-125) WNL WNL  Hip extension (0-15)    Hip abduction (0-40) WNL WNL  Hip adduction    Hip internal rotation (0-45) WNL* WNL*  Hip external rotation (0-45) WNL* WNL*  Knee flexion WNL WNL   (*= pain, Blank rows = not tested)  LOWER EXTREMITY MMT:   MMT Right 02/06/22 Left 02/06/22  Hip flexion 4 3*  Hip extension    Hip abduction    Hip adduction    Hip internal rotation 4 4  Hip external rotation 4 4  Knee flexion 4 3  Knee extension 5 5  Ankle dorsiflexion (in seated) 5 5  Ankle plantarflexion    Ankle inversion    Ankle eversion    (*= pain, Blank rows = not tested)   HIP SPECIAL TESTS:  FADDIR: + B with groin pain  FABER: +B with groin pain     PALPATION: Abdominal:  Diastasis: none  LLQ Tenderness upon superficial palpation, pain increased closer to ASIS  Iliopsoas tender and produced lower abdomen pain with radiation to the groin   RUQ- Scar from gallbladder removal, scar tissue restriction noted along scar   RLQ- Tenderness upon deep palpation, but not as severe as the L  Towards pubic symphysis some discomfort/pain   B adductor musculature tender upon palpation    TODAY'S TREATMENT  Neuromuscular Re-education: Supine hooklying diaphragmatic breathing with VCs and TCs for downregulation of the nervous system and improved management of IAP    EXTERNAL PELVIC EXAM: Patient educated on the purpose of the pelvic exam and articulated understanding; patient consented to the exam verbally.  Breath coordination: present but inconsistent  Voluntary Contraction: present, 2/5 MMT with Valsalva maneuver  Relaxation: delayed  Perineal movement with sustained IAP increase ("bear down"): no change, then felt descent after cueing Perineal movement with rapid IAP increase ("cough"): no change  (0= no contraction, 1= flicker, 2= weak squeeze, 3= fair squeeze with lift, 4= good squeeze and lift against resistance, 5= strong squeeze against strong resistance)   Discussion and demonstration of diaphragm-pelvic floor connection via video.   Supine hooklying diaphragmatic breathing with VCs and TCs for downregulation of the nervous system and improved management of IAP  Supine hooklying PFM lengthening techniques with diaphragmatic breathing, VCs and TCs as needed              B Single knee to chest   Double knee to chest              "Happy baby" pose   Butterfly pose "adductor stretch"   Piriformis pose   Patient response to interventions: Pt felt more tension in the L gluteal than R    Patient Education:  Patient provided with HEP: PFM lengthening techniques. Patient educated throughout session on appropriate technique  and form using multi-modal cueing, HEP, and activity modification. Patient will continue to benefit from further education in order to maximize compliance and understanding for long-term therapeutic gains.    ASSESSMENT:  Clinical Impression: Patient presents to clinic with excellent motivation to participate in today's session. Pt continues to demonstrate deficits in PFM coordination, PFM strength, PFM extensibility, pain,  IAP management, LE strength, posture and sensation. Discussion on health as Pt had various testing performed the past couple weeks. Pt is doing better overall and would like to return to consistency of PFPT. On external PFM exam, Pt demonstrates deficits in PFM strength, PFM coordination, and PFM extensibility as evidenced by inconsistent breath coordination, 2/5 MMT with Valsalva maneuver, delayed PFM relaxation, no change initially with sustained IAP increase ("bear down") but after cueing able to demonstrate descent of PFM. Pt required moderate VCs and TCs for proper technique and to incorporate diaphragmatic breathing into lengthening techniques. Pt responded positively to active and educational interventions. Patient will continue to benefit from skilled therapeutic intervention to address deficits in PFM coordination, PFM strength, PFM extensibility, pain, IAP management, LE strength, posture, and sensation in order to increase PLOF and improve overall QOL.    Objective Impairments: decreased activity tolerance, decreased coordination, decreased endurance, decreased strength, increased muscle spasms, improper body mechanics, and pain.   Activity Limitations: lifting, bending, sleeping, bed mobility, continence, toileting, hygiene/grooming, and locomotion level  Personal Factors: Age, Behavior pattern, Past/current experiences, Time since onset of injury/illness/exacerbation, and 3+ comorbidities: CHF, CKD, brain tumor benign, T2DM, HTN  are also affecting patient's functional  outcome.   Rehab Potential: Good  Clinical Decision Making: Evolving/moderate complexity  Evaluation Complexity: Moderate   GOALS: Goals reviewed with patient? Yes  SHORT TERM GOALS: Target date: 04/17/22  Patient will improve scores on FOTO Urinary Problem, Bowel Leakage, and Constipation by >/=7 points in order to demonstrate improved pelvic floor dysfunction with limited disruptions at home and in the community for an improved QOL.  Baseline: 37, 52, 60 Goal status: INITIAL    LONG TERM GOALS: Target date: 04/17/2022   Patient will report confidence in ability to control bladder >/= 5/10 in order to demonstrate improved function and ability to participate more fully in activities at home and in the community. Baseline: 1/10 Goal status: INITIAL  2.  Patient will report decreased instances of urinary leakage over a 3-week period in order to demonstrate improved bladder control and allow for increased participation in activities outside of the home. Baseline: urinary leakage every day (enough to make outer clothes wet)/changing 4-5x a day Goal status: INITIAL  3.  Patient will report decreased instances of bowel leakage over a 3-week period in order to demonstrate improved bladder control and allow for increased participation in activities outside of the home. Baseline: (More than once a week)/up to 2x Goal status: INITIAL  4.  Patient will report being able to return to activities including, but not limited to: walking, church functions, sexual activity without limitation to indicate complete resolution of the chief complaint and return to prior level of participation at home and in the community. Baseline: unable to attend a church service without bowel/bladder leakage/not participating in sexual activity due to leakage Goal status: INITIAL  5.  Patient will report less than 5 incidents of stress urinary incontinence over the course of 3 weeks while laughing/bending/prolonged  activity in order to demonstrate improved PFM coordination, strength, and function for improved overall QOL. Baseline: every time she laughs/bends over Goal status: INITIAL  6.  Patient will report decrease urinary leakage during the night in order to demonstrate improved PFM coordination, strength, improved sleep quality, and overall QOL.  Baseline: wakes up 3-4x/night and leaks every time Goal status: INITIAL  PLAN: PT Frequency: 1x/week  PT Duration: 12 weeks  Planned Interventions: Therapeutic exercises, Therapeutic activity, Neuromuscular re-education, Balance training, Gait training, Patient/Family  education, Joint mobilization, Spinal mobilization, Moist heat, scar mobilization, Taping, and Manual therapy  Plan For Next Session: review goals, re-eval   Aima Mcwhirt, PT, DPT  04/10/2022, 11:51 AM

## 2022-04-12 ENCOUNTER — Other Ambulatory Visit: Payer: Self-pay | Admitting: Internal Medicine

## 2022-04-12 DIAGNOSIS — J4520 Mild intermittent asthma, uncomplicated: Secondary | ICD-10-CM

## 2022-04-15 MED ORDER — IPRATROPIUM-ALBUTEROL 0.5-2.5 (3) MG/3ML IN SOLN: RESPIRATORY_TRACT | 3 refills | Status: DC

## 2022-04-15 NOTE — Addendum Note (Signed)
Addended by: Orland Mustard on: 04/15/2022 04:51 PM   Modules accepted: Orders

## 2022-04-16 DIAGNOSIS — S86812A Strain of other muscle(s) and tendon(s) at lower leg level, left leg, initial encounter: Secondary | ICD-10-CM | POA: Diagnosis not present

## 2022-04-16 DIAGNOSIS — M7122 Synovial cyst of popliteal space [Baker], left knee: Secondary | ICD-10-CM | POA: Diagnosis not present

## 2022-04-17 ENCOUNTER — Ambulatory Visit: Payer: Medicare HMO

## 2022-04-17 ENCOUNTER — Ambulatory Visit: Payer: Medicare HMO | Attending: Cardiovascular Disease

## 2022-04-17 DIAGNOSIS — I25118 Atherosclerotic heart disease of native coronary artery with other forms of angina pectoris: Secondary | ICD-10-CM

## 2022-04-17 LAB — ECHOCARDIOGRAM COMPLETE
AR max vel: 1.55 cm2
AV Area VTI: 1.37 cm2
AV Area mean vel: 1.5 cm2
AV Mean grad: 5 mmHg
AV Peak grad: 9.2 mmHg
Ao pk vel: 1.52 m/s
Area-P 1/2: 2.87 cm2
Calc EF: 49.9 %
S' Lateral: 4 cm
Single Plane A2C EF: 47.8 %
Single Plane A4C EF: 50.5 %

## 2022-04-19 ENCOUNTER — Other Ambulatory Visit: Payer: Self-pay | Admitting: Cardiovascular Disease

## 2022-04-19 ENCOUNTER — Other Ambulatory Visit: Payer: Self-pay | Admitting: Family

## 2022-04-19 DIAGNOSIS — I4891 Unspecified atrial fibrillation: Secondary | ICD-10-CM

## 2022-04-19 DIAGNOSIS — F32A Depression, unspecified: Secondary | ICD-10-CM

## 2022-04-21 NOTE — Telephone Encounter (Signed)
Prescription refill request for Xarelto received.  Indication: PAD/CAD/ CABG: With stable angina Last office visit: 03/18/22 ([place]) Weight: 95.2kg Age: 61 Scr:1.04 (03/11/22) CrCl: 83.61m/min  Continue aspirin with Xarelto 2.5 mg BID per Dr GDonivan ScullOV note. Will refill rx. refill sent to requested pharmacy.

## 2022-04-22 ENCOUNTER — Telehealth: Payer: Self-pay | Admitting: *Deleted

## 2022-04-22 DIAGNOSIS — Z1331 Encounter for screening for depression: Secondary | ICD-10-CM | POA: Diagnosis not present

## 2022-04-22 DIAGNOSIS — N762 Acute vulvitis: Secondary | ICD-10-CM | POA: Diagnosis not present

## 2022-04-22 DIAGNOSIS — E876 Hypokalemia: Secondary | ICD-10-CM

## 2022-04-22 DIAGNOSIS — Z01419 Encounter for gynecological examination (general) (routine) without abnormal findings: Secondary | ICD-10-CM | POA: Diagnosis not present

## 2022-04-22 DIAGNOSIS — R609 Edema, unspecified: Secondary | ICD-10-CM

## 2022-04-22 MED ORDER — POTASSIUM CHLORIDE CRYS ER 10 MEQ PO TBCR: 10.0000 meq | EXTENDED_RELEASE_TABLET | Freq: Every day | ORAL | 3 refills | Status: DC

## 2022-04-22 MED ORDER — FUROSEMIDE 20 MG PO TABS: ORAL_TABLET | ORAL | 0 refills | Status: DC

## 2022-04-22 NOTE — Telephone Encounter (Signed)
-----   Message from Minna Merritts, MD sent at 04/19/2022 11:37 AM EDT ----- Echocardiogram Slowly improving LV function, stable if not mildly improved compared to prior study Right ventricle appears moderately dilated, decreased function with extra fluid/higher pressure For treatment of this would make sure she is taking at least 1 Lasix daily with potassium We will recommend adding additional Lasix after lunch 3 days a week with extra potassium  moderate fluids and salt, make sure not to overdo it

## 2022-04-22 NOTE — Telephone Encounter (Signed)
Spoke with pt, aware of echo results and the change in medications. Patient voiced understanding and repeated medication change. No prescriptions needed at this time.

## 2022-04-24 ENCOUNTER — Ambulatory Visit: Payer: Medicare HMO

## 2022-04-24 DIAGNOSIS — M6281 Muscle weakness (generalized): Secondary | ICD-10-CM | POA: Diagnosis not present

## 2022-04-24 DIAGNOSIS — M6289 Other specified disorders of muscle: Secondary | ICD-10-CM

## 2022-04-24 DIAGNOSIS — G4710 Hypersomnia, unspecified: Secondary | ICD-10-CM | POA: Diagnosis not present

## 2022-04-24 DIAGNOSIS — R278 Other lack of coordination: Secondary | ICD-10-CM | POA: Diagnosis not present

## 2022-04-24 DIAGNOSIS — I259 Chronic ischemic heart disease, unspecified: Secondary | ICD-10-CM | POA: Diagnosis not present

## 2022-04-24 DIAGNOSIS — G4733 Obstructive sleep apnea (adult) (pediatric): Secondary | ICD-10-CM | POA: Diagnosis not present

## 2022-04-24 NOTE — Therapy (Signed)
OUTPATIENT PHYSICAL THERAPY FEMALE PELVIC RE-EVALUATION   Patient Name: [name] MRN: [id number] DOB:[birth year], 61 y.o., female Today's Date: 04/24/2022   PT End of Session - 04/24/22 1018     Visit Number 6    Number of Visits 20    Date for PT Re-Evaluation 07/03/22    Authorization Type IE: 01/23/22, RC: 04/24/22    PT Start Time 1015    PT Stop Time 1055    PT Time Calculation (min) 40 min    Activity Tolerance Patient tolerated treatment well             Past Medical History:  Diagnosis Date   Allergy    Anemia    Arthritis    Asthma    Bacterial vaginitis    Blood in stool    Brachial neuritis or radiculitis NOS    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 being followed by neurosurgery/eye doctor and pcp. monitoring size.causes sinus problems   Bronchitis    Cardiac arrhythmia    Cervical neck pain with evidence of disc disease    C5/6 disease, MRI done late 2012 - no records available   Chronic combined systolic and diastolic CHF (congestive heart failure) ([place])    a. 08/2010 Echo: mildly reduced EF 40-45%, mild diffuse hypokinesis; b. 06/2016 Echo: EF 50%, no rwma, mild to mod TR; c. 03/2017 Echo: EF 40-45%, Gr1 DD (prior echo reviewed and EF felt to be lower than reported).   Chronic pain    Cocaine abuse, in remission (HCC)    clean x 24 years   Colon polyps    COPD (chronic obstructive pulmonary disease) ([place])    a. 12/2018 PFT: No obvious obst/restrictive dzs.   Coronary artery disease    a. PCI of LCX 2003; b. PCI of the LAD 2012 with a (2.5 x 8 mm BMS);  c.s/p CABG 4/12: L-LAD, VG-Dx, VG-OM, VG-RCA (Dr. Prescott Gum);  d. 01/2012 MV: inf infarct, attenuation, no ischemia; e. 02/2017 MV: signifi attenuation artifact. Fixed basal antlat/inflat scar vs artifact. Reversible apical lat and mid antlat defect - ? atten vs ischemia. F/u echo w/o wma->Med rx.   Depression    Diabetes mellitus without complication ([place])    Family history of colon  cancer    Generalized headaches    frequent   GERD (gastroesophageal reflux disease)    Headache    Heart murmur    Hematochezia    Hepatitis    history of hepatitis b   History of cervical cancer    s/p cryotherapy   History of drug abuse ([place])    cocaine, marijuana, clean since 1989   History of hepatitis B    from eating undercooked liver   History of MI (myocardial infarction)    Hyperlipidemia    Hypertension    Ischemic cardiomyopathy    a. 03/2017 Echo: EF 40-45%, Gr1 DD.   Myocardial infarction ([place]) 2003, 2012   PAD (peripheral artery disease) ([place])    a. s/p Right SFA atherectomy and PTA 01/15/11; b. 07/2018 ABI: R 1.02, L 1.09.   Pituitary mass ([place])    a. 12/2018 MRI Brain: Stable pituitary mass w/ 83m area of necrosis. Mass abuts R cavernous sinus w/o definite invasion.   Polyp of colon    Seasonal allergies    Sleep apnea    uses cpap   Smoking history    quit 07/2010   Thyroid disease    Urinary incontinence  Past Surgical History:  Procedure Laterality Date   ABDOMINAL HYSTERECTOMY     2003   CHOLECYSTECTOMY  1986   COLONOSCOPY  2008   3 polyps   COLONOSCOPY WITH PROPOFOL N/A 02/06/2015   Procedure: COLONOSCOPY WITH PROPOFOL;  Surgeon: Lucilla Lame, MD;  Location: [facility] ENDOSCOPY;  Service: Endoscopy;  Laterality: N/A;   COLONOSCOPY WITH PROPOFOL N/A 01/27/2017   Procedure: COLONOSCOPY WITH PROPOFOL;  Surgeon: Lucilla Lame, MD;  Location: [facility] ENDOSCOPY;  Service: Endoscopy;  Laterality: N/A;   CORONARY ANGIOPLASTY     w/ stent placement x2   CORONARY ARTERY BYPASS GRAFT  2012   Dr [name]   CORONARY STENT PLACEMENT  2003   S/P MI   CORONARY STENT PLACEMENT  2007   [place]   ESOPHAGOGASTRODUODENOSCOPY (EGD) WITH PROPOFOL N/A 02/06/2015   Procedure: ESOPHAGOGASTRODUODENOSCOPY (EGD) WITH PROPOFOL;  Surgeon: Lucilla Lame, MD;  Location: [facility] ENDOSCOPY;  Service: Endoscopy;  Laterality: N/A;   ESOPHAGOGASTRODUODENOSCOPY (EGD) WITH PROPOFOL N/A 01/27/2017    Procedure: ESOPHAGOGASTRODUODENOSCOPY (EGD) WITH PROPOFOL;  Surgeon: Lucilla Lame, MD;  Location: [facility] ENDOSCOPY;  Service: Endoscopy;  Laterality: N/A;   ESOPHAGOGASTRODUODENOSCOPY (EGD) WITH PROPOFOL N/A 09/01/2019   Procedure: ESOPHAGOGASTRODUODENOSCOPY (EGD) WITH PROPOFOL;  Surgeon: Lin Landsman, MD;  Location: [facility];  Service: Gastroenterology;  Laterality: N/A;   FEMORAL ARTERY STENT  10/2010   right sided (Dr. Burt Knack)   KNEE ARTHROSCOPY WITH MEDIAL MENISECTOMY Right 08/20/2017   Procedure: KNEE ARTHROSCOPY WITH MEDIAL  AND LATERAL MENISECTOMY;  Surgeon: Hessie Knows, MD;  Location: [facility] ORS;  Service: Orthopedics;  Laterality: Right;   POSTERIOR CERVICAL LAMINECTOMY N/A 03/08/2018   Procedure: POSTERIOR CERVICAL LAMINECTOMY-C7;  Surgeon: Deetta Perla, MD;  Location: [facility] ORS;  Service: Neurosurgery;  Laterality: N/A;   TONSILLECTOMY     TOTAL KNEE ARTHROPLASTY Right 04/14/2019   Procedure: RIGHT TOTAL KNEE ARTHROPLASTY;  Surgeon: Hessie Knows, MD;  Location: [facility] ORS;  Service: Orthopedics;  Laterality: Right;   TUBAL LIGATION     Patient Active Problem List   Diagnosis Date Noted   COPD exacerbation ([place]) 03/12/2022   PND (post-nasal drip) 07/24/2021   Chronic pain of inguinal region 07/24/2021   Thrush 04/19/2021   Skin lesion 04/19/2021   Vaginal itching 04/19/2021   Vitamin D deficiency 01/14/2021   Breast mass, right 01/14/2021   Annual physical exam 01/14/2021   Pruritus of both eyes 09/21/2020   Lipoma of right lower extremity 08/30/2020   Hypertension associated with diabetes ([place]) 07/24/2020   Abnormality of both breasts on screening mammogram 05/14/2020   Bilateral hip pain 04/19/2020   Insomnia 04/19/2020   Chronic low back pain 02/22/2020   Internal hemorrhoids 02/22/2020   Benign breast cyst in female, right 02/22/2020   OSA on CPAP 12/15/2019   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[place]) 12/02/2019   Sensorimotor neuropathy 12/02/2019   Irritable bowel syndrome with  both constipation and diarrhea 10/13/2019   Mild intermittent asthma 10/13/2019   PUD (peptic ulcer disease)    Status post total knee replacement using cement, right 04/14/2019   Overactive bladder 03/10/2019   Mild persistent asthma 11/29/2018   DDD (degenerative disc disease), cervical 11/26/2018   Chronic constipation 11/26/2018   Fatty liver 11/26/2018   Adrenal adenoma, left 11/26/2018   Allergic rhinitis 08/26/2018   Bunion of right foot 08/26/2018   Fall 05/14/2018   Anxiety and depression 04/05/2018   Cervical radiculopathy 02/24/2018   Numbness and tingling in right hand 02/24/2018   Chronic neck pain 12/18/2017   Type 2  diabetes mellitus without complication, without long-term current use of insulin ([place]) 12/18/2017   Urinary incontinence 12/18/2017   Lumbar radiculopathy 12/18/2017   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   Abnormal feces    Benign neoplasm of ascending colon    Intractable vomiting with nausea    Acute esophagogastric ulcer    Gastritis without bleeding    Vasomotor flushing 11/24/2016   Morbid obesity ([place]) 10/29/2015   Back ache 06/19/2015   Colon polyp 06/19/2015   CCF (congestive cardiac failure) ([place]) 06/19/2015   Family history of colon cancer 06/19/2015   Orthostatic hypotension 04/30/2015   Hematochezia 01/11/2015   History of colonic polyps 01/11/2015   Atypical chest pain 01/20/2014   COPD with chronic bronchitis and emphysema ([place]) 01/03/2013   Right knee pain 12/20/2011   HTN (hypertension) 76/28/3151   Systolic CHF, chronic (HCC)    History of cervical cancer    Depression    GERD (gastroesophageal reflux disease)    Seasonal allergies    History of drug abuse ([place])    PVD (peripheral vascular disease) ([place]) 02/03/2011   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   Smoking history 12/16/2010   Hyperlipidemia 08/23/2010   Coronary artery disease involving native coronary artery of native heart without angina pectoris 08/23/2010   CLAUDICATION  08/23/2010   CAROTID BRUIT 08/23/2010   SHORTNESS OF BREATH 08/23/2010    PCP: [name], [name], MD   REFERRING PROVIDER: [name], [name], MD   REFERRING DIAG:  N32.81,N81.9 (ICD-10-CM) - Overactive bladder due to prolapse of female genital organ   THERAPY DIAG:  Pelvic floor dysfunction  Muscle weakness (generalized)  Other lack of coordination  Rationale for Evaluation and Treatment: Rehabilitation  ONSET DATE: A few years now  OCCUPATION/SOCIAL ACTIVITIES: Read, church, shopping, walking   PLOF: Independent  PERTINENT HISTORY/CHART REVIEW: CKD, CHF  Orland Mustard, MD (12/06/21) Plan Overactive bladder due to prolapse of female genital organ - Plan: Ambulatory referral to Physical Therapy, Vibegron (GEMTESA) 75 MG TABS, consider gyn or urogyn in the future if does not improve with PT   CHIEF CONCERN: Pt reports her kidneys are "so bad" and the urinary leakage has been happening for some years but this year has been the worst of it. Pt tries to perform kegels in order to hold her urine when she has the very strong urge to go to the bathroom (often). Pt is unable to make it to the bathroom without having to crunch over (forward trunk flexion) and squeeze her legs tight in order to not have leakage. However, most of the time the urinary leakage happens. Pt has to plan around where to go in the community and know where the bathroom is at all times. Pt has tried briefs and feels that her urine overflows and she has to constantly change her clothes. Pt has to change her clothes about 4-5x a day because she does not wear a brief. Pt also reports bedwetting occasionally because she cannot make it to the bathroom on time. Most recently, Pt has had bowel leakage with the urinary leakage as well. In some instances, she doesn't even notice the bowel leakage and notices when she wipes as her skin is very irritated and red. In regards to the prolapse, when Pt wipes  every time she does feel "something" there but Dr did not tell her about prolapse. Pt had a bedside commode and used for awhile but is currently not using. Pt is going to her kidney doctor today and stopped  taking the OAB medicine given because doctors are trying to figure out if the OAB medication is interfering with her kidneys/blood platelets.   LIVING ENVIRONMENT: Lives with: lives with their family Lives in: House/apartment   PATIENT GOALS: Pt be able to have control of her bladder, less leakage, not feeling a pressure at the vaginal opening. Pt would like to participate in sexual activity but fears about incontinence.     UROLOGICAL HISTORY Fluid intake: Yes: Water (tries a gallon a day per Dr.), fruit juice (pineapple, mango) , very occasionally a sprite Pain with urination: No Fully empty bladder: Yes Stream: Strong every time  Urgency: Yes:   Nocturia: 3-4x/night Frequency: "over 30x a day"  Leakage: Urge to void, Walking to the bathroom, Laughing, and Bending forward Pads: No because it hasn't helped and they cannot hold all the urine that comes out    GASTROINTESTINAL HISTORY  Pt has hx of constipation and is on medication (Linzess) Pain with bowel movement: No Type of bowel movement ([place] Stool Chart): Type 5 and 7  Fully empty rectum: Yes:   Leakage: Yes: occasionally, but just recently, does not know if that is related to the medication because it is watery , Type 7 on underwear when she notices but not everyday (2x/week) , having to wear 2 underwears in case leakage happens especially when out in the community  Pads: No   SEXUAL HISTORY/FUNCTION Pain with intercourse: No Finger stimulation: no pain, but has leakage and cannot continue  Not engaging in intercourse currently due to incontinence   OBSTETRICAL HISTORY Vaginal deliveries: G4P2 Tearing: Yes: with first child    GYNECOLOGICAL HISTORY Hysterectomy: yes, partial hysterectomy  Pelvic Organ  Prolapse: see above in pertinent hx, not graded  Heaviness/pressure: sometimes    SUBJECTIVE:  Pt arrived to clinic with L CAM boot due to a strain in the achilles tendon according to orthopedic doctor. Pt has to wear for 2 weeks. Pt with no pain at the ankle.   PAIN:  Are you having pain? No NPRS scale: 0/10                                                                                                                                                                           OBJECTIVE:    COGNITION: Overall cognitive status: Within functional limits for tasks assessed     POSTURE:  Iliac crest height: L iliac crest  Pelvic obliquity: WNL   SENSATION:  Light touch:  L2-L4 intact, L5-S1 impaired, no dx of peripheral neuropathy in feet but has T2DM    LUMBAR AROM/PROM:   AROM (Normal range in degrees) AROM  02/06/22  Flexion (65) WNL  Extension (25-30) WNL  Right lateral  flexion (25) WNL  Left lateral flexion (25) WNL*  Right rotation (30) WNL  Left rotation (30) WNL*   (*= pain, Blank rows = not tested)  LOWER EXTREMITY ROM:    PROM (Normal range in degrees) Right 02/06/22 Left 02/06/22  Hip flexion (0-125) WNL WNL  Hip extension (0-15)    Hip abduction (0-40) WNL WNL  Hip adduction    Hip internal rotation (0-45) WNL* WNL*  Hip external rotation (0-45) WNL* WNL*  Knee flexion WNL WNL   (*= pain, Blank rows = not tested)  LOWER EXTREMITY MMT:   MMT Right 02/06/22 Left 02/06/22  Hip flexion 4 3*  Hip extension    Hip abduction    Hip adduction    Hip internal rotation 4 4  Hip external rotation 4 4  Knee flexion 4 3  Knee extension 5 5  Ankle dorsiflexion (in seated) 5 5  Ankle plantarflexion    Ankle inversion    Ankle eversion    (*= pain, Blank rows = not tested)   HIP SPECIAL TESTS:  FADDIR: + B with groin pain  FABER: +B with groin pain    PALPATION: Abdominal:  Diastasis: none  LLQ Tenderness upon superficial palpation, pain increased  closer to ASIS  Iliopsoas tender and produced lower abdomen pain with radiation to the groin   RUQ- Scar from gallbladder removal, scar tissue restriction noted along scar   RLQ- Tenderness upon deep palpation, but not as severe as the L  Towards pubic symphysis some discomfort/pain   B adductor musculature tender upon palpation    TODAY'S TREATMENT  Neuromuscular Re-education: FOTO Reassessment -  Discussion on results and improvement  FOTO Bowel Leakage - 52 (IE: 58) FOTO Constipation - 60 (IE: 68)  FOTO Urinary Problem - 44 (IE: 37)   Review of LTGs and STGs below  Praised for progression and what to expect in future sessions  Pain has improved significantly at lower abdominals - LLQ and intermittent 8/10 compared to IE (10/10 and throughout lower abdominals/constant)   Patient response to interventions: Pt is pleased with the improvement so far in PFPT   Patient Education:  Patient provided with HEP: no change. Patient educated throughout session on appropriate technique and form using multi-modal cueing, HEP, and activity modification. Patient will continue to benefit from further education in order to maximize compliance and understanding for long-term therapeutic gains.    ASSESSMENT:  Clinical Impression: Patient presents to clinic with excellent motivation to participate in today's reassessment. Upon reassessment of LTGs and STGs above, Pt demonstrates moderate improvement since IE (01/23/22) amidst some health complications Pt has had over the past few months. Pt is now wearing incontinence pads which aids in containing urinary leakage and for Pt to maintain proper posture (decreased forward flexion, adducted hips, Valsalva) when walking to the bathroom, Pt is only requiring to change undergarments in the home 2x/day compared to 4-5x/day, decreased bowel leakage (once per week) compared to IE (more than once a week), and increased bladder confidence from 1/10 to 4/10.  In terms of lower abdominal pain, Pt reports significant improvement as pain was constant and rated 10/10, but now is intermittent with worst pain being 8/10. Pt has demonstrated understanding with diaphragmatic breathing and continues to practice that faithfully. Based on FOTO responses above, Pt has increased scores in each category (Urinary Leakage, Bowel Leakage, Bowel Constipation) which correlates to improvements listed above. Although Pt demonstrates improvement, Pt continues to have moderate urinary leakage especially with increased  IAP pressure, nocturia, and intermittent pain. Pt will continue to benefit from skilled therapeutic intervention to address deficits in PFM coordination, PFM strength, PFM extensibility, pain, IAP management, LE strength, posture, and sensation in order to increase PLOF and improve overall QOL.    Objective Impairments: decreased activity tolerance, decreased coordination, decreased endurance, decreased strength, increased muscle spasms, improper body mechanics, and pain.   Activity Limitations: lifting, bending, sleeping, bed mobility, continence, toileting, hygiene/grooming, and locomotion level  Personal Factors: Age, Behavior pattern, Past/current experiences, Time since onset of injury/illness/exacerbation, and 3+ comorbidities: CHF, CKD, brain tumor benign, T2DM, HTN  are also affecting patient's functional outcome.   Rehab Potential: Good  Clinical Decision Making: Evolving/moderate complexity  Evaluation Complexity: Moderate   GOALS: Goals reviewed with patient? Yes  SHORT TERM GOALS: Target date: 06/05/22  Patient will improve scores on FOTO Urinary Problem, Bowel Leakage, and Constipation by >/=7 points in order to demonstrate improved pelvic floor dysfunction with limited disruptions at home and in the community for an improved QOL.  Baseline: 37, 52, 60; (9/28): 44, 58, 68  Goal status: IN PROGRESS    LONG TERM GOALS: Target date:  07/03/2022  Patient will report confidence in ability to control bladder >/= 5/10 in order to demonstrate improved function and ability to participate more fully in activities at home and in the community. Baseline: 1/10; (9/28): 4/10  Goal status: IN PROGRESS  2.  Patient will report decreased instances of urinary leakage over a 3-week period in order to demonstrate improved bladder control and allow for increased participation in activities outside of the home. Baseline: urinary leakage every day (enough to make outer clothes wet)/changing 4-5x a day; (9/28): Pt is wearing incontinence pads in the community/2x per day Goal status: IN PROGRESS  3.  Patient will report decreased instances of bowel leakage over a 3-week period in order to demonstrate improved bladder control and allow for increased participation in activities outside of the home. Baseline: (More than once a week)/up to 2x; (9/28): once a week Goal status: IN PROGRESS  4.  Patient will report being able to return to activities including, but not limited to: walking, church functions, sexual activity without limitation to indicate complete resolution of the chief complaint and return to prior level of participation at home and in the community. Baseline: unable to attend a church service without bowel/bladder leakage/not participating in sexual activity due to leakage; (9/28): not participating in intercourse, still having urinary leakage in church but not as heavy as IE Goal status: IN PROGRESS  5.  Patient will report less than 5 incidents of stress urinary incontinence over the course of 3 weeks while laughing/bending/prolonged activity in order to demonstrate improved PFM coordination, strength, and function for improved overall QOL. Baseline: every time she laughs/bends over; (9/28): still having urinary leakage but not every time Goal status: IN PROGRESS  6.  Patient will report decrease urinary leakage during the night in  order to demonstrate improved PFM coordination, strength, improved sleep quality, and overall QOL.  Baseline: wakes up 3-4x/night and leaks every time; (9/28): 4x per night, not leaking every time walking to btahroom  Goal status: IN PROGRESS  PLAN: PT Frequency: 1x/week  PT Duration: 12 weeks  Planned Interventions: Therapeutic exercises, Therapeutic activity, Neuromuscular re-education, Balance training, Gait training, Patient/Family education, Joint mobilization, Spinal mobilization, Moist heat, scar mobilization, Taping, and Manual therapy  Plan For Next Session: STSs with breath, thoracic rotation, begin deep core   Gerik Coberly, PT, DPT  04/24/2022, 12:02 PM

## 2022-04-25 ENCOUNTER — Telehealth: Payer: Self-pay | Admitting: Cardiovascular Disease

## 2022-04-25 NOTE — Telephone Encounter (Signed)
New Message:    Patient said she received her Echo results, but she still have some more questions please. She is have some concerns about what is the next step.[name]

## 2022-04-28 NOTE — Telephone Encounter (Signed)
Reviewed the patient's recent echo results:   Lorraine Merritts, MD  04/19/2022 11:37 AM EDT     Echocardiogram Slowly improving LV function, stable if not mildly improved compared to prior study Right ventricle appears moderately dilated, decreased function with extra fluid/higher pressure For treatment of this would make sure she is taking at least 1 Lasix daily with potassium We will recommend adding additional Lasix after lunch 3 days a week with extra potassium  moderate fluids and salt, make sure not to overdo it    I called and spoke with the patient. She states she is taking the lasix/ potassium as directed by Dr. [name] based on her recent echo report. She advised that she has been urinating "a lot" and her breathing is improved. She is able to go up and down the stairs without getting winded.   She was just concerned about next steps and then realized she had a follow up appointment on 10/23 with Barbera Setters, NP.  I advised the patient that Barbera Setters will perform a physical exam that day and that we may need to recheck some lab work on her as well to ensure her electrolytes are stable.  Otherwise, periodic echos can be ordered to reassess her EF/ heart pressures.  The patient voices understanding and is agreeable.

## 2022-04-29 ENCOUNTER — Ambulatory Visit: Payer: Medicare HMO | Attending: Internal Medicine

## 2022-04-29 ENCOUNTER — Other Ambulatory Visit: Payer: Self-pay | Admitting: Internal Medicine

## 2022-04-29 DIAGNOSIS — M6281 Muscle weakness (generalized): Secondary | ICD-10-CM | POA: Insufficient documentation

## 2022-04-29 DIAGNOSIS — F419 Anxiety disorder, unspecified: Secondary | ICD-10-CM

## 2022-04-29 DIAGNOSIS — M6289 Other specified disorders of muscle: Secondary | ICD-10-CM | POA: Insufficient documentation

## 2022-04-29 DIAGNOSIS — R278 Other lack of coordination: Secondary | ICD-10-CM | POA: Diagnosis not present

## 2022-04-29 MED ORDER — PAROXETINE HCL 20 MG PO TABS: 20.0000 mg | ORAL_TABLET | Freq: Every day | ORAL | 3 refills | Status: DC

## 2022-04-29 NOTE — Therapy (Signed)
OUTPATIENT PHYSICAL THERAPY FEMALE PELVIC TREATMENT   Patient Name: [name] MRN: [id number] DOB:1961/02/02, 61 y.o., female Today's Date: 04/29/2022   PT End of Session - 04/29/22 1126     Visit Number 7    Number of Visits 20    Date for PT Re-Evaluation 07/03/22    Authorization Type IE: 01/23/22, RC: 04/24/22    PT Start Time 1130    PT Stop Time 1210    PT Time Calculation (min) 40 min    Activity Tolerance Patient tolerated treatment well             Past Medical History:  Diagnosis Date   Allergy    Anemia    Arthritis    Asthma    Bacterial vaginitis    Blood in stool    Brachial neuritis or radiculitis NOS    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 being followed by neurosurgery/eye doctor and pcp. monitoring size.causes sinus problems   Bronchitis    Cardiac arrhythmia    Cervical neck pain with evidence of disc disease    C5/6 disease, MRI done late 2012 - no records available   Chronic combined systolic and diastolic CHF (congestive heart failure) ([place])    a. 08/2010 Echo: mildly reduced EF 40-45%, mild diffuse hypokinesis; b. 06/2016 Echo: EF 50%, no rwma, mild to mod TR; c. 03/2017 Echo: EF 40-45%, Gr1 DD (prior echo reviewed and EF felt to be lower than reported).   Chronic pain    Cocaine abuse, in remission (HCC)    clean x 24 years   Colon polyps    COPD (chronic obstructive pulmonary disease) ([place])    a. 12/2018 PFT: No obvious obst/restrictive dzs.   Coronary artery disease    a. PCI of LCX 2003; b. PCI of the LAD 2012 with a (2.5 x 8 mm BMS);  c.s/p CABG 4/12: L-LAD, VG-Dx, VG-OM, VG-RCA (Dr. Prescott Gum);  d. 01/2012 MV: inf infarct, attenuation, no ischemia; e. 02/2017 MV: signifi attenuation artifact. Fixed basal antlat/inflat scar vs artifact. Reversible apical lat and mid antlat defect - ? atten vs ischemia. F/u echo w/o wma->Med rx.   Depression    Diabetes mellitus without complication ([place])    Family history of colon cancer     Generalized headaches    frequent   GERD (gastroesophageal reflux disease)    Headache    Heart murmur    Hematochezia    Hepatitis    history of hepatitis b   History of cervical cancer    s/p cryotherapy   History of drug abuse ([place])    cocaine, marijuana, clean since 1989   History of hepatitis B    from eating undercooked liver   History of MI (myocardial infarction)    Hyperlipidemia    Hypertension    Ischemic cardiomyopathy    a. 03/2017 Echo: EF 40-45%, Gr1 DD.   Myocardial infarction ([place]) 2003, 2012   PAD (peripheral artery disease) ([place])    a. s/p Right SFA atherectomy and PTA 01/15/11; b. 07/2018 ABI: R 1.02, L 1.09.   Pituitary mass ([place])    a. 12/2018 MRI Brain: Stable pituitary mass w/ 36m area of necrosis. Mass abuts R cavernous sinus w/o definite invasion.   Polyp of colon    Seasonal allergies    Sleep apnea    uses cpap   Smoking history    quit 07/2010   Thyroid disease    Urinary incontinence  Past Surgical History:  Procedure Laterality Date   ABDOMINAL HYSTERECTOMY     2003   CHOLECYSTECTOMY  1986   COLONOSCOPY  2008   3 polyps   COLONOSCOPY WITH PROPOFOL N/A 02/06/2015   Procedure: COLONOSCOPY WITH PROPOFOL;  Surgeon: Lucilla Lame, MD;  Location: [facility] ENDOSCOPY;  Service: Endoscopy;  Laterality: N/A;   COLONOSCOPY WITH PROPOFOL N/A 01/27/2017   Procedure: COLONOSCOPY WITH PROPOFOL;  Surgeon: Lucilla Lame, MD;  Location: [facility] ENDOSCOPY;  Service: Endoscopy;  Laterality: N/A;   CORONARY ANGIOPLASTY     w/ stent placement x2   CORONARY ARTERY BYPASS GRAFT  2012   Dr [name]   CORONARY STENT PLACEMENT  2003   S/P MI   CORONARY STENT PLACEMENT  2007   [place]   ESOPHAGOGASTRODUODENOSCOPY (EGD) WITH PROPOFOL N/A 02/06/2015   Procedure: ESOPHAGOGASTRODUODENOSCOPY (EGD) WITH PROPOFOL;  Surgeon: Lucilla Lame, MD;  Location: [facility] ENDOSCOPY;  Service: Endoscopy;  Laterality: N/A;   ESOPHAGOGASTRODUODENOSCOPY (EGD) WITH PROPOFOL N/A 01/27/2017   Procedure:  ESOPHAGOGASTRODUODENOSCOPY (EGD) WITH PROPOFOL;  Surgeon: Lucilla Lame, MD;  Location: [facility] ENDOSCOPY;  Service: Endoscopy;  Laterality: N/A;   ESOPHAGOGASTRODUODENOSCOPY (EGD) WITH PROPOFOL N/A 09/01/2019   Procedure: ESOPHAGOGASTRODUODENOSCOPY (EGD) WITH PROPOFOL;  Surgeon: Lin Landsman, MD;  Location: [place];  Service: Gastroenterology;  Laterality: N/A;   FEMORAL ARTERY STENT  10/2010   right sided (Dr. Burt Knack)   KNEE ARTHROSCOPY WITH MEDIAL MENISECTOMY Right 08/20/2017   Procedure: KNEE ARTHROSCOPY WITH MEDIAL  AND LATERAL MENISECTOMY;  Surgeon: Hessie Knows, MD;  Location: [facility] ORS;  Service: Orthopedics;  Laterality: Right;   POSTERIOR CERVICAL LAMINECTOMY N/A 03/08/2018   Procedure: POSTERIOR CERVICAL LAMINECTOMY-C7;  Surgeon: Deetta Perla, MD;  Location: [facility] ORS;  Service: Neurosurgery;  Laterality: N/A;   TONSILLECTOMY     TOTAL KNEE ARTHROPLASTY Right 04/14/2019   Procedure: RIGHT TOTAL KNEE ARTHROPLASTY;  Surgeon: Hessie Knows, MD;  Location: [facility] ORS;  Service: Orthopedics;  Laterality: Right;   TUBAL LIGATION     Patient Active Problem List   Diagnosis Date Noted   COPD exacerbation ([place]) 03/12/2022   PND (post-nasal drip) 07/24/2021   Chronic pain of inguinal region 07/24/2021   Thrush 04/19/2021   Skin lesion 04/19/2021   Vaginal itching 04/19/2021   Vitamin D deficiency 01/14/2021   Breast mass, right 01/14/2021   Annual physical exam 01/14/2021   Pruritus of both eyes 09/21/2020   Lipoma of right lower extremity 08/30/2020   Hypertension associated with diabetes ([place]) 07/24/2020   Abnormality of both breasts on screening mammogram 05/14/2020   Bilateral hip pain 04/19/2020   Insomnia 04/19/2020   Chronic low back pain 02/22/2020   Internal hemorrhoids 02/22/2020   Benign breast cyst in female, right 02/22/2020   OSA on CPAP 12/15/2019   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[place]) 12/02/2019   Sensorimotor neuropathy 12/02/2019   Irritable bowel syndrome with both  constipation and diarrhea 10/13/2019   Mild intermittent asthma 10/13/2019   PUD (peptic ulcer disease)    Status post total knee replacement using cement, right 04/14/2019   Overactive bladder 03/10/2019   Mild persistent asthma 11/29/2018   DDD (degenerative disc disease), cervical 11/26/2018   Chronic constipation 11/26/2018   Fatty liver 11/26/2018   Adrenal adenoma, left 11/26/2018   Allergic rhinitis 08/26/2018   Bunion of right foot 08/26/2018   Fall 05/14/2018   Anxiety and depression 04/05/2018   Cervical radiculopathy 02/24/2018   Numbness and tingling in right hand 02/24/2018   Chronic neck pain 12/18/2017   Type 2  diabetes mellitus without complication, without long-term current use of insulin ([place]) 12/18/2017   Urinary incontinence 12/18/2017   Lumbar radiculopathy 12/18/2017   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   Abnormal feces    Benign neoplasm of ascending colon    Intractable vomiting with nausea    Acute esophagogastric ulcer    Gastritis without bleeding    Vasomotor flushing 11/24/2016   Morbid obesity ([place]) 10/29/2015   Back ache 06/19/2015   Colon polyp 06/19/2015   CCF (congestive cardiac failure) ([place]) 06/19/2015   Family history of colon cancer 06/19/2015   Orthostatic hypotension 04/30/2015   Hematochezia 01/11/2015   History of colonic polyps 01/11/2015   Atypical chest pain 01/20/2014   COPD with chronic bronchitis and emphysema ([place]) 01/03/2013   Right knee pain 12/20/2011   HTN (hypertension) 76/28/3151   Systolic CHF, chronic (HCC)    History of cervical cancer    Depression    GERD (gastroesophageal reflux disease)    Seasonal allergies    History of drug abuse ([place])    PVD (peripheral vascular disease) ([place]) 02/03/2011   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   Smoking history 12/16/2010   Hyperlipidemia 08/23/2010   Coronary artery disease involving native coronary artery of native heart without angina pectoris 08/23/2010   CLAUDICATION  08/23/2010   CAROTID BRUIT 08/23/2010   SHORTNESS OF BREATH 08/23/2010    PCP: [name], [name], MD   REFERRING PROVIDER: [name], [name], MD   REFERRING DIAG:  N32.81,N81.9 (ICD-10-CM) - Overactive bladder due to prolapse of female genital organ   THERAPY DIAG:  Pelvic floor dysfunction  Muscle weakness (generalized)  Other lack of coordination  Rationale for Evaluation and Treatment: Rehabilitation  ONSET DATE: A few years now  OCCUPATION/SOCIAL ACTIVITIES: Read, church, shopping, walking   PLOF: Independent  PERTINENT HISTORY/CHART REVIEW: CKD, CHF  Orland Mustard, MD (12/06/21) Plan Overactive bladder due to prolapse of female genital organ - Plan: Ambulatory referral to Physical Therapy, Vibegron (GEMTESA) 75 MG TABS, consider gyn or urogyn in the future if does not improve with PT   CHIEF CONCERN: Pt reports her kidneys are "so bad" and the urinary leakage has been happening for some years but this year has been the worst of it. Pt tries to perform kegels in order to hold her urine when she has the very strong urge to go to the bathroom (often). Pt is unable to make it to the bathroom without having to crunch over (forward trunk flexion) and squeeze her legs tight in order to not have leakage. However, most of the time the urinary leakage happens. Pt has to plan around where to go in the community and know where the bathroom is at all times. Pt has tried briefs and feels that her urine overflows and she has to constantly change her clothes. Pt has to change her clothes about 4-5x a day because she does not wear a brief. Pt also reports bedwetting occasionally because she cannot make it to the bathroom on time. Most recently, Pt has had bowel leakage with the urinary leakage as well. In some instances, she doesn't even notice the bowel leakage and notices when she wipes as her skin is very irritated and red. In regards to the prolapse, when Pt wipes  every time she does feel "something" there but Dr did not tell her about prolapse. Pt had a bedside commode and used for awhile but is currently not using. Pt is going to her kidney doctor today and stopped  taking the OAB medicine given because doctors are trying to figure out if the OAB medication is interfering with her kidneys/blood platelets.   LIVING ENVIRONMENT: Lives with: lives with their family Lives in: House/apartment   PATIENT GOALS: Pt be able to have control of her bladder, less leakage, not feeling a pressure at the vaginal opening. Pt would like to participate in sexual activity but fears about incontinence.     UROLOGICAL HISTORY Fluid intake: Yes: Water (tries a gallon a day per Dr.), fruit juice (pineapple, mango) , very occasionally a sprite Pain with urination: No Fully empty bladder: Yes Stream: Strong every time  Urgency: Yes:   Nocturia: 3-4x/night Frequency: "over 30x a day"  Leakage: Urge to void, Walking to the bathroom, Laughing, and Bending forward Pads: No because it hasn't helped and they cannot hold all the urine that comes out    GASTROINTESTINAL HISTORY  Pt has hx of constipation and is on medication ([place]) Pain with bowel movement: No Type of bowel movement ([place] Stool Chart): Type 5 and 7  Fully empty rectum: Yes:   Leakage: Yes: occasionally, but just recently, does not know if that is related to the medication because it is watery , Type 7 on underwear when she notices but not everyday (2x/week) , having to wear 2 underwears in case leakage happens especially when out in the community  Pads: No   SEXUAL HISTORY/FUNCTION Pain with intercourse: No Finger stimulation: no pain, but has leakage and cannot continue  Not engaging in intercourse currently due to incontinence   OBSTETRICAL HISTORY Vaginal deliveries: G4P2 Tearing: Yes: with first child    GYNECOLOGICAL HISTORY Hysterectomy: yes, partial hysterectomy  Pelvic Organ  Prolapse: see above in pertinent hx, not graded  Heaviness/pressure: sometimes    SUBJECTIVE:  Pt arrived L CAM boot and having no pain. Pt is having some L heel pain but goes away after some walking.    PAIN:  Are you having pain? No NPRS scale: 0/10                                                                                                                                                                           OBJECTIVE:    COGNITION: Overall cognitive status: Within functional limits for tasks assessed     POSTURE:  Iliac crest height: L iliac crest  Pelvic obliquity: WNL   SENSATION:  Light touch:  L2-L4 intact, L5-S1 impaired, no dx of peripheral neuropathy in feet but has T2DM    LUMBAR AROM/PROM:   AROM (Normal range in degrees) AROM  02/06/22  Flexion (65) WNL  Extension (25-30) WNL  Right lateral flexion (25) WNL  Left lateral flexion (25) WNL*  Right  rotation (30) WNL  Left rotation (30) WNL*   (*= pain, Blank rows = not tested)  LOWER EXTREMITY ROM:    PROM (Normal range in degrees) Right 02/06/22 Left 02/06/22  Hip flexion (0-125) WNL WNL  Hip extension (0-15)    Hip abduction (0-40) WNL WNL  Hip adduction    Hip internal rotation (0-45) WNL* WNL*  Hip external rotation (0-45) WNL* WNL*  Knee flexion WNL WNL   (*= pain, Blank rows = not tested)  LOWER EXTREMITY MMT:   MMT Right 02/06/22 Left 02/06/22  Hip flexion 4 3*  Hip extension    Hip abduction    Hip adduction    Hip internal rotation 4 4  Hip external rotation 4 4  Knee flexion 4 3  Knee extension 5 5  Ankle dorsiflexion (in seated) 5 5  Ankle plantarflexion    Ankle inversion    Ankle eversion    (*= pain, Blank rows = not tested)   HIP SPECIAL TESTS:  FADDIR: + B with groin pain  FABER: +B with groin pain    PALPATION: Abdominal:  Diastasis: none  LLQ Tenderness upon superficial palpation, pain increased closer to ASIS  Iliopsoas tender and produced lower  abdomen pain with radiation to the groin   RUQ- Scar from gallbladder removal, scar tissue restriction noted along scar   RLQ- Tenderness upon deep palpation, but not as severe as the L  Towards pubic symphysis some discomfort/pain   B adductor musculature tender upon palpation    EXTERNAL PELVIC EXAM: Patient educated on the purpose of the pelvic exam and articulated understanding; patient consented to the exam verbally.  Breath coordination: present but inconsistent  Voluntary Contraction: present, 2/5 MMT with Valsalva maneuver  Relaxation: delayed  Perineal movement with sustained IAP increase ("bear down"): no change, then felt descent after cueing Perineal movement with rapid IAP increase ("cough"): no change  (0= no contraction, 1= flicker, 2= weak squeeze, 3= fair squeeze with lift, 4= good squeeze and lift against resistance, 5= strong squeeze against strong resistance)   TODAY'S TREATMENT  Neuromuscular Re-education: Discussion on trauma-based psychotherapy and seeking further assistance if Pt is able. Pt verbalized understanding.   Right sidelying thoracic rotations,  x10, for improved lengthening of the anterior fascial slings   Supine hooklying diaphragmatic breathing with VCs and TCs for downregulation of the nervous system and improved management of IAP  [facility] abdominal rehab   Supine hooklying TrA contraction with coordinated exhale   STSs, x10, with coordinated breathing for improved IAP management, VCs and TCs required    Patient response to interventions: Pt felt lengthening of anterior fascial sling during thoracic rotation   Patient Education:  Patient provided with HEP: STSs with breath, thoracic rotation, supina TrA activation. Patient educated throughout session on appropriate technique and form using multi-modal cueing, HEP, and activity modification. Patient will continue to benefit from further education in order to maximize compliance and  understanding for long-term therapeutic gains.    ASSESSMENT:  Clinical Impression: Patient presents to clinic with excellent motivation to participate in today's reassessment. Pt continues to demonstrates deficits in PFM coordination, PFM strength, PFM extensibility, pain, IAP management, LE strength, posture, and sensation. Pt arrived to clinic with L CAM boot and meets with orthopedic doctor this week to receive clearance to remove the boot. Discussion on pelvic floor therapy being an holistic approach and seeking further assistance for psychotherapy as Pt voiced concern about her physical health also being impacted by her mental  health. Pt verbalized understanding. Pt required moderate VCs and TCs during TrA activation and diaphragmatic breathing during STSs to decrease bodily compensations. Pt responded positively to active and educational interventions. Pt will continue to benefit from skilled therapeutic intervention to address deficits in PFM coordination, PFM strength, PFM extensibility, pain, IAP management, LE strength, posture, and sensation in order to increase PLOF and improve overall QOL.    Objective Impairments: decreased activity tolerance, decreased coordination, decreased endurance, decreased strength, increased muscle spasms, improper body mechanics, and pain.   Activity Limitations: lifting, bending, sleeping, bed mobility, continence, toileting, hygiene/grooming, and locomotion level  Personal Factors: Age, Behavior pattern, Past/current experiences, Time since onset of injury/illness/exacerbation, and 3+ comorbidities: CHF, CKD, brain tumor benign, T2DM, HTN  are also affecting patient's functional outcome.   Rehab Potential: Good  Clinical Decision Making: Evolving/moderate complexity  Evaluation Complexity: Moderate   GOALS: Goals reviewed with patient? Yes  SHORT TERM GOALS: Target date: 06/05/22  Patient will improve scores on FOTO Urinary Problem, Bowel  Leakage, and Constipation by >/=7 points in order to demonstrate improved pelvic floor dysfunction with limited disruptions at home and in the community for an improved QOL.  Baseline: 37, 52, 60; (9/28): 44, 58, 68  Goal status: IN PROGRESS    LONG TERM GOALS: Target date: 07/03/2022  Patient will report confidence in ability to control bladder >/= 5/10 in order to demonstrate improved function and ability to participate more fully in activities at home and in the community. Baseline: 1/10; (9/28): 4/10  Goal status: IN PROGRESS  2.  Patient will report decreased instances of urinary leakage over a 3-week period in order to demonstrate improved bladder control and allow for increased participation in activities outside of the home. Baseline: urinary leakage every day (enough to make outer clothes wet)/changing 4-5x a day; (9/28): Pt is wearing incontinence pads in the community/2x per day Goal status: IN PROGRESS  3.  Patient will report decreased instances of bowel leakage over a 3-week period in order to demonstrate improved bladder control and allow for increased participation in activities outside of the home. Baseline: (More than once a week)/up to 2x; (9/28): once a week Goal status: IN PROGRESS  4.  Patient will report being able to return to activities including, but not limited to: walking, church functions, sexual activity without limitation to indicate complete resolution of the chief complaint and return to prior level of participation at home and in the community. Baseline: unable to attend a church service without bowel/bladder leakage/not participating in sexual activity due to leakage; (9/28): not participating in intercourse, still having urinary leakage in church but not as heavy as IE Goal status: IN PROGRESS  5.  Patient will report less than 5 incidents of stress urinary incontinence over the course of 3 weeks while laughing/bending/prolonged activity in order to  demonstrate improved PFM coordination, strength, and function for improved overall QOL. Baseline: every time she laughs/bends over; (9/28): still having urinary leakage but not every time Goal status: IN PROGRESS  6.  Patient will report decrease urinary leakage during the night in order to demonstrate improved PFM coordination, strength, improved sleep quality, and overall QOL.  Baseline: wakes up 3-4x/night and leaks every time; (9/28): 4x per night, not leaking every time walking to btahroom  Goal status: IN PROGRESS  PLAN: PT Frequency: 1x/week  PT Duration: 12 weeks  Planned Interventions: Therapeutic exercises, Therapeutic activity, Neuromuscular re-education, Balance training, Gait training, Patient/Family education, Joint mobilization, Spinal mobilization, Moist heat, scar mobilization,  Taping, and Manual therapy  Plan For Next Session: how did rotation/STSs go? Continue deep core  Nekia Maxham, PT, DPT  04/29/2022, 11:27 AM

## 2022-04-30 DIAGNOSIS — M869 Osteomyelitis, unspecified: Secondary | ICD-10-CM | POA: Diagnosis not present

## 2022-04-30 DIAGNOSIS — S86812A Strain of other muscle(s) and tendon(s) at lower leg level, left leg, initial encounter: Secondary | ICD-10-CM | POA: Diagnosis not present

## 2022-05-01 ENCOUNTER — Ambulatory Visit: Payer: Medicare HMO

## 2022-05-01 DIAGNOSIS — E785 Hyperlipidemia, unspecified: Secondary | ICD-10-CM | POA: Diagnosis not present

## 2022-05-01 DIAGNOSIS — E1122 Type 2 diabetes mellitus with diabetic chronic kidney disease: Secondary | ICD-10-CM | POA: Diagnosis not present

## 2022-05-01 DIAGNOSIS — I10 Essential (primary) hypertension: Secondary | ICD-10-CM | POA: Diagnosis not present

## 2022-05-01 DIAGNOSIS — I779 Disorder of arteries and arterioles, unspecified: Secondary | ICD-10-CM | POA: Diagnosis not present

## 2022-05-01 DIAGNOSIS — N3281 Overactive bladder: Secondary | ICD-10-CM | POA: Diagnosis not present

## 2022-05-01 DIAGNOSIS — R609 Edema, unspecified: Secondary | ICD-10-CM | POA: Diagnosis not present

## 2022-05-01 DIAGNOSIS — N1831 Chronic kidney disease, stage 3a: Secondary | ICD-10-CM | POA: Diagnosis not present

## 2022-05-01 DIAGNOSIS — J449 Chronic obstructive pulmonary disease, unspecified: Secondary | ICD-10-CM | POA: Diagnosis not present

## 2022-05-01 DIAGNOSIS — K219 Gastro-esophageal reflux disease without esophagitis: Secondary | ICD-10-CM | POA: Diagnosis not present

## 2022-05-01 DIAGNOSIS — I509 Heart failure, unspecified: Secondary | ICD-10-CM | POA: Diagnosis not present

## 2022-05-08 ENCOUNTER — Ambulatory Visit: Payer: Medicare HMO

## 2022-05-08 DIAGNOSIS — M6289 Other specified disorders of muscle: Secondary | ICD-10-CM

## 2022-05-08 DIAGNOSIS — R278 Other lack of coordination: Secondary | ICD-10-CM

## 2022-05-08 DIAGNOSIS — M6281 Muscle weakness (generalized): Secondary | ICD-10-CM | POA: Diagnosis not present

## 2022-05-08 NOTE — Therapy (Signed)
OUTPATIENT PHYSICAL THERAPY FEMALE PELVIC TREATMENT   Patient Name: [name] MRN: [id number] DOB:September 09, 1960, 61 y.o., female Today's Date: 05/08/2022   PT End of Session - 05/08/22 1002     Visit Number 8    Number of Visits 20    Date for PT Re-Evaluation 07/03/22    Authorization Type IE: 01/23/22, RC: 04/24/22    PT Start Time 1005    PT Stop Time 1045    PT Time Calculation (min) 40 min    Activity Tolerance Patient tolerated treatment well             Past Medical History:  Diagnosis Date   Allergy    Anemia    Arthritis    Asthma    Bacterial vaginitis    Blood in stool    Brachial neuritis or radiculitis NOS    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 being followed by neurosurgery/eye doctor and pcp. monitoring size.causes sinus problems   Bronchitis    Cardiac arrhythmia    Cervical neck pain with evidence of disc disease    C5/6 disease, MRI done late 2012 - no records available   Chronic combined systolic and diastolic CHF (congestive heart failure) ([place])    a. 08/2010 Echo: mildly reduced EF 40-45%, mild diffuse hypokinesis; b. 06/2016 Echo: EF 50%, no rwma, mild to mod TR; c. 03/2017 Echo: EF 40-45%, Gr1 DD (prior echo reviewed and EF felt to be lower than reported).   Chronic pain    Cocaine abuse, in remission (HCC)    clean x 24 years   Colon polyps    COPD (chronic obstructive pulmonary disease) ([place])    a. 12/2018 PFT: No obvious obst/restrictive dzs.   Coronary artery disease    a. PCI of LCX 2003; b. PCI of the LAD 2012 with a (2.5 x 8 mm BMS);  c.s/p CABG 4/12: L-LAD, VG-Dx, VG-OM, VG-RCA (Dr. Prescott Gum);  d. 01/2012 MV: inf infarct, attenuation, no ischemia; e. 02/2017 MV: signifi attenuation artifact. Fixed basal antlat/inflat scar vs artifact. Reversible apical lat and mid antlat defect - ? atten vs ischemia. F/u echo w/o wma->Med rx.   Depression    Diabetes mellitus without complication ([place])    Family history of colon cancer     Generalized headaches    frequent   GERD (gastroesophageal reflux disease)    Headache    Heart murmur    Hematochezia    Hepatitis    history of hepatitis b   History of cervical cancer    s/p cryotherapy   History of drug abuse ([place])    cocaine, marijuana, clean since 1989   History of hepatitis B    from eating undercooked liver   History of MI (myocardial infarction)    Hyperlipidemia    Hypertension    Ischemic cardiomyopathy    a. 03/2017 Echo: EF 40-45%, Gr1 DD.   Myocardial infarction ([place]) 2003, 2012   PAD (peripheral artery disease) ([place])    a. s/p Right SFA atherectomy and PTA 01/15/11; b. 07/2018 ABI: R 1.02, L 1.09.   Pituitary mass ([place])    a. 12/2018 MRI Brain: Stable pituitary mass w/ 36m area of necrosis. Mass abuts R cavernous sinus w/o definite invasion.   Polyp of colon    Seasonal allergies    Sleep apnea    uses cpap   Smoking history    quit 07/2010   Thyroid disease    Urinary incontinence  Past Surgical History:  Procedure Laterality Date   ABDOMINAL HYSTERECTOMY     2003   CHOLECYSTECTOMY  1986   COLONOSCOPY  2008   3 polyps   COLONOSCOPY WITH PROPOFOL N/A 02/06/2015   Procedure: COLONOSCOPY WITH PROPOFOL;  Surgeon: Lucilla Lame, MD;  Location: [facility] ENDOSCOPY;  Service: Endoscopy;  Laterality: N/A;   COLONOSCOPY WITH PROPOFOL N/A 01/27/2017   Procedure: COLONOSCOPY WITH PROPOFOL;  Surgeon: Lucilla Lame, MD;  Location: [facility] ENDOSCOPY;  Service: Endoscopy;  Laterality: N/A;   CORONARY ANGIOPLASTY     w/ stent placement x2   CORONARY ARTERY BYPASS GRAFT  2012   Dr [name]   CORONARY STENT PLACEMENT  2003   S/P MI   CORONARY STENT PLACEMENT  2007   [place]   ESOPHAGOGASTRODUODENOSCOPY (EGD) WITH PROPOFOL N/A 02/06/2015   Procedure: ESOPHAGOGASTRODUODENOSCOPY (EGD) WITH PROPOFOL;  Surgeon: Lucilla Lame, MD;  Location: [facility] ENDOSCOPY;  Service: Endoscopy;  Laterality: N/A;   ESOPHAGOGASTRODUODENOSCOPY (EGD) WITH PROPOFOL N/A 01/27/2017   Procedure:  ESOPHAGOGASTRODUODENOSCOPY (EGD) WITH PROPOFOL;  Surgeon: Lucilla Lame, MD;  Location: [facility] ENDOSCOPY;  Service: Endoscopy;  Laterality: N/A;   ESOPHAGOGASTRODUODENOSCOPY (EGD) WITH PROPOFOL N/A 09/01/2019   Procedure: ESOPHAGOGASTRODUODENOSCOPY (EGD) WITH PROPOFOL;  Surgeon: Lin Landsman, MD;  Location: [place];  Service: Gastroenterology;  Laterality: N/A;   FEMORAL ARTERY STENT  10/2010   right sided (Dr. Burt Knack)   KNEE ARTHROSCOPY WITH MEDIAL MENISECTOMY Right 08/20/2017   Procedure: KNEE ARTHROSCOPY WITH MEDIAL  AND LATERAL MENISECTOMY;  Surgeon: Hessie Knows, MD;  Location: [facility] OR[facility];  Service: Orthopedics;  Laterality: Right;   POSTERIOR CERVICAL LAMINECTOMY N/A 03/08/2018   Procedure: POSTERIOR CERVICAL LAMINECTOMY-C7;  Surgeon: Deetta Perla, MD;  Location: [facility];  Service: Neurosurgery;  Laterality: N/A;   TONSILLECTOMY     TOTAL KNEE ARTHROPLASTY Right 04/14/2019   Procedure: RIGHT TOTAL KNEE ARTHROPLASTY;  Surgeon: Hessie Knows, MD;  Location: [facility] OR[facility];  Service: Orthopedics;  Laterality: Right;   TUBAL LIGATION     Patient Active Problem List   Diagnosis Date Noted   COPD exacerbation ([place]) 03/12/2022   PND (post-nasal drip) 07/24/2021   Chronic pain of inguinal region 07/24/2021   Thrush 04/19/2021   Skin lesion 04/19/2021   Vaginal itching 04/19/2021   Vitamin D deficiency 01/14/2021   Breast mass, right 01/14/2021   Annual physical exam 01/14/2021   Pruritus of both eyes 09/21/2020   Lipoma of right lower extremity 08/30/2020   Hypertension associated with diabetes ([place]) 07/24/2020   Abnormality of both breasts on screening mammogram 05/14/2020   Bilateral hip pain 04/19/2020   Insomnia 04/19/2020   Chronic low back pain 02/22/2020   Internal hemorrhoids 02/22/2020   Benign breast cyst in female, right 02/22/2020   OSA on CPAP 12/15/2019   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[place]) 12/02/2019   Sensorimotor neuropathy 12/02/2019   Irritable bowel syndrome with both  constipation and diarrhea 10/13/2019   Mild intermittent asthma 10/13/2019   PUD (peptic ulcer disease)    Status post total knee replacement using cement, right 04/14/2019   Overactive bladder 03/10/2019   Mild persistent asthma 11/29/2018   DDD (degenerative disc disease), cervical 11/26/2018   Chronic constipation 11/26/2018   Fatty liver 11/26/2018   Adrenal adenoma, left 11/26/2018   Allergic rhinitis 08/26/2018   Bunion of right foot 08/26/2018   Fall 05/14/2018   Anxiety and depression 04/05/2018   Cervical radiculopathy 02/24/2018   Numbness and tingling in right hand 02/24/2018   Chronic neck pain 12/18/2017   Type 2  diabetes mellitus without complication, without long-term current use of insulin ([place]) 12/18/2017   Urinary incontinence 12/18/2017   Lumbar radiculopathy 12/18/2017   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   Abnormal feces    Benign neoplasm of ascending colon    Intractable vomiting with nausea    Acute esophagogastric ulcer    Gastritis without bleeding    Vasomotor flushing 11/24/2016   Morbid obesity ([place]) 10/29/2015   Back ache 06/19/2015   Colon polyp 06/19/2015   CCF (congestive cardiac failure) ([place]) 06/19/2015   Family history of colon cancer 06/19/2015   Orthostatic hypotension 04/30/2015   Hematochezia 01/11/2015   History of colonic polyps 01/11/2015   Atypical chest pain 01/20/2014   COPD with chronic bronchitis and emphysema ([place]) 01/03/2013   Right knee pain 12/20/2011   HTN (hypertension) 76/28/3151   Systolic CHF, chronic (HCC)    History of cervical cancer    Depression    GERD (gastroesophageal reflux disease)    Seasonal allergies    History of drug abuse ([place])    PVD (peripheral vascular disease) ([place]) 02/03/2011   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   Smoking history 12/16/2010   Hyperlipidemia 08/23/2010   Coronary artery disease involving native coronary artery of native heart without angina pectoris 08/23/2010   CLAUDICATION  08/23/2010   CAROTID BRUIT 08/23/2010   SHORTNESS OF BREATH 08/23/2010    PCP: [name], [name], MD   REFERRING PROVIDER: [name], [name], MD   REFERRING DIAG:  N32.81,N81.9 (ICD-10-CM) - Overactive bladder due to prolapse of female genital organ   THERAPY DIAG:  Pelvic floor dysfunction  Muscle weakness (generalized)  Other lack of coordination  Rationale for Evaluation and Treatment: Rehabilitation  ONSET DATE: A few years now  OCCUPATION/SOCIAL ACTIVITIES: Read, church, shopping, walking   PLOF: Independent  PERTINENT HISTORY/CHART REVIEW: CKD, CHF  Orland Mustard, MD (12/06/21) Plan Overactive bladder due to prolapse of female genital organ - Plan: Ambulatory referral to Physical Therapy, Vibegron (GEMTESA) 75 MG TABS, consider gyn or urogyn in the future if does not improve with PT   CHIEF CONCERN: Pt reports her kidneys are "so bad" and the urinary leakage has been happening for some years but this year has been the worst of it. Pt tries to perform kegels in order to hold her urine when she has the very strong urge to go to the bathroom (often). Pt is unable to make it to the bathroom without having to crunch over (forward trunk flexion) and squeeze her legs tight in order to not have leakage. However, most of the time the urinary leakage happens. Pt has to plan around where to go in the community and know where the bathroom is at all times. Pt has tried briefs and feels that her urine overflows and she has to constantly change her clothes. Pt has to change her clothes about 4-5x a day because she does not wear a brief. Pt also reports bedwetting occasionally because she cannot make it to the bathroom on time. Most recently, Pt has had bowel leakage with the urinary leakage as well. In some instances, she doesn't even notice the bowel leakage and notices when she wipes as her skin is very irritated and red. In regards to the prolapse, when Pt wipes  every time she does feel "something" there but Dr did not tell her about prolapse. Pt had a bedside commode and used for awhile but is currently not using. Pt is going to her kidney doctor today and stopped  taking the OAB medicine given because doctors are trying to figure out if the OAB medication is interfering with her kidneys/blood platelets.   LIVING ENVIRONMENT: Lives with: lives with their family Lives in: House/apartment   PATIENT GOALS: Pt be able to have control of her bladder, less leakage, not feeling a pressure at the vaginal opening. Pt would like to participate in sexual activity but fears about incontinence.     UROLOGICAL HISTORY Fluid intake: Yes: Water (tries a gallon a day per Dr.), fruit juice (pineapple, mango) , very occasionally a sprite Pain with urination: No Fully empty bladder: Yes Stream: Strong every time  Urgency: Yes:   Nocturia: 3-4x/night Frequency: "over 30x a day"  Leakage: Urge to void, Walking to the bathroom, Laughing, and Bending forward Pads: No because it hasn't helped and they cannot hold all the urine that comes out    GASTROINTESTINAL HISTORY  Pt has hx of constipation and is on medication (Linzess) Pain with bowel movement: No Type of bowel movement ([place] Stool Chart): Type 5 and 7  Fully empty rectum: Yes:   Leakage: Yes: occasionally, but just recently, does not know if that is related to the medication because it is watery , Type 7 on underwear when she notices but not everyday (2x/week) , having to wear 2 underwears in case leakage happens especially when out in the community  Pads: No   SEXUAL HISTORY/FUNCTION Pain with intercourse: No Finger stimulation: no pain, but has leakage and cannot continue  Not engaging in intercourse currently due to incontinence   OBSTETRICAL HISTORY Vaginal deliveries: G4P2 Tearing: Yes: with first child    GYNECOLOGICAL HISTORY Hysterectomy: yes, partial hysterectomy  Pelvic Organ  Prolapse: see above in pertinent hx, not graded  Heaviness/pressure: sometimes    SUBJECTIVE:  Pt arrived without L CAM boot and is walking well. Pt is doing well and practicing HEP.    PAIN:  Are you having pain? No NPRS scale: 0/10                                                                                                                                                                           OBJECTIVE:    COGNITION: Overall cognitive status: Within functional limits for tasks assessed     POSTURE:  Iliac crest height: L iliac crest  Pelvic obliquity: WNL   SENSATION:  Light touch:  L2-L4 intact, L5-S1 impaired, no dx of peripheral neuropathy in feet but has T2DM    LUMBAR AROM/PROM:   AROM (Normal range in degrees) AROM  02/06/22  Flexion (65) WNL  Extension (25-30) WNL  Right lateral flexion (25) WNL  Left lateral flexion (25) WNL*  Right rotation (30) WNL  Left  rotation (30) WNL*   (*= pain, Blank rows = not tested)  LOWER EXTREMITY ROM:    PROM (Normal range in degrees) Right 02/06/22 Left 02/06/22  Hip flexion (0-125) WNL WNL  Hip extension (0-15)    Hip abduction (0-40) WNL WNL  Hip adduction    Hip internal rotation (0-45) WNL* WNL*  Hip external rotation (0-45) WNL* WNL*  Knee flexion WNL WNL   (*= pain, Blank rows = not tested)  LOWER EXTREMITY MMT:   MMT Right 02/06/22 Left 02/06/22  Hip flexion 4 3*  Hip extension    Hip abduction    Hip adduction    Hip internal rotation 4 4  Hip external rotation 4 4  Knee flexion 4 3  Knee extension 5 5  Ankle dorsiflexion (in seated) 5 5  Ankle plantarflexion    Ankle inversion    Ankle eversion    (*= pain, Blank rows = not tested)   HIP SPECIAL TESTS:  FADDIR: + B with groin pain  FABER: +B with groin pain    PALPATION: Abdominal:  Diastasis: none  LLQ Tenderness upon superficial palpation, pain increased closer to ASIS  Iliopsoas tender and produced lower abdomen pain with  radiation to the groin   RUQ- Scar from gallbladder removal, scar tissue restriction noted along scar   RLQ- Tenderness upon deep palpation, but not as severe as the L  Towards pubic symphysis some discomfort/pain   B adductor musculature tender upon palpation    EXTERNAL PELVIC EXAM: Patient educated on the purpose of the pelvic exam and articulated understanding; patient consented to the exam verbally.  Breath coordination: present but inconsistent  Voluntary Contraction: present, 2/5 MMT with Valsalva maneuver  Relaxation: delayed  Perineal movement with sustained IAP increase ("bear down"): no change, then felt descent after cueing Perineal movement with rapid IAP increase ("cough"): no change  (0= no contraction, 1= flicker, 2= weak squeeze, 3= fair squeeze with lift, 4= good squeeze and lift against resistance, 5= strong squeeze against strong resistance)   TODAY'S TREATMENT  Neuromuscular Re-education: Discussion on urinary urgency control especially during the night (nocturia) and the increased risk for falls. Pt given educational handout. Pt verbalized understanding.   Seated diaphragmatic breathing with VCs and TCs for downregulation of the nervous system and improved management of IAP  Seated TrA activation for improved IAP management with coordinated breath, VCs and TCs required   Supine hooklying diaphragmatic breathing with VCs and TCs for downregulation of the nervous system and improved management of IAP  Supine hooklying diaphragmatic breathing with VCs and TCs for downregulation of the nervous system and improved management of IAP  [facility] abdominal rehab   Supine hooklying TrA w/marches     Patient response to interventions: Pt able to differentiate between TrA contraction and pelvic floor in seated   Patient Education:  Patient provided with HEP: seated TrA activation, supine marches with TrA activation. Patient educated throughout session on appropriate  technique and form using multi-modal cueing, HEP, and activity modification. Patient will continue to benefit from further education in order to maximize compliance and understanding for long-term therapeutic gains.    ASSESSMENT:  Clinical Impression: Patient presents to clinic with excellent motivation to participate in today's reassessment. Pt continues to demonstrates deficits in PFM coordination, PFM strength, PFM extensibility, pain, IAP management, LE strength, posture, and sensation. Pt arrived to clinic without L CAM boot and in no pain at the L ankle. Time taken to discuss urinary urgency control via  multiple techniques. Pt required moderate VCs and TCs to decrease significant PFM activation with TrA in various positions and progression of TrA activation. With increased time and cueing, Pt able to differentiate between TrA and PFM activation. Pt responded positively to active and educational interventions. Pt will continue to benefit from skilled therapeutic intervention to address deficits in PFM coordination, PFM strength, PFM extensibility, pain, IAP management, LE strength, posture, and sensation in order to increase PLOF and improve overall QOL.    Objective Impairments: decreased activity tolerance, decreased coordination, decreased endurance, decreased strength, increased muscle spasms, improper body mechanics, and pain.   Activity Limitations: lifting, bending, sleeping, bed mobility, continence, toileting, hygiene/grooming, and locomotion level  Personal Factors: Age, Behavior pattern, Past/current experiences, Time since onset of injury/illness/exacerbation, and 3+ comorbidities: CHF, CKD, brain tumor benign, T2DM, HTN  are also affecting patient's functional outcome.   Rehab Potential: Good  Clinical Decision Making: Evolving/moderate complexity  Evaluation Complexity: Moderate   GOALS: Goals reviewed with patient? Yes  SHORT TERM GOALS: Target date: 06/05/22  Patient  will improve scores on FOTO Urinary Problem, Bowel Leakage, and Constipation by >/=7 points in order to demonstrate improved pelvic floor dysfunction with limited disruptions at home and in the community for an improved QOL.  Baseline: 37, 52, 60; (9/28): 44, 58, 68  Goal status: IN PROGRESS    LONG TERM GOALS: Target date: 07/03/2022  Patient will report confidence in ability to control bladder >/= 5/10 in order to demonstrate improved function and ability to participate more fully in activities at home and in the community. Baseline: 1/10; (9/28): 4/10  Goal status: IN PROGRESS  2.  Patient will report decreased instances of urinary leakage over a 3-week period in order to demonstrate improved bladder control and allow for increased participation in activities outside of the home. Baseline: urinary leakage every day (enough to make outer clothes wet)/changing 4-5x a day; (9/28): Pt is wearing incontinence pads in the community/2x per day Goal status: IN PROGRESS  3.  Patient will report decreased instances of bowel leakage over a 3-week period in order to demonstrate improved bladder control and allow for increased participation in activities outside of the home. Baseline: (More than once a week)/up to 2x; (9/28): once a week Goal status: IN PROGRESS  4.  Patient will report being able to return to activities including, but not limited to: walking, church functions, sexual activity without limitation to indicate complete resolution of the chief complaint and return to prior level of participation at home and in the community. Baseline: unable to attend a church service without bowel/bladder leakage/not participating in sexual activity due to leakage; (9/28): not participating in intercourse, still having urinary leakage in church but not as heavy as IE Goal status: IN PROGRESS  5.  Patient will report less than 5 incidents of stress urinary incontinence over the course of 3 weeks while  laughing/bending/prolonged activity in order to demonstrate improved PFM coordination, strength, and function for improved overall QOL. Baseline: every time she laughs/bends over; (9/28): still having urinary leakage but not every time Goal status: IN PROGRESS  6.  Patient will report decrease urinary leakage during the night in order to demonstrate improved PFM coordination, strength, improved sleep quality, and overall QOL.  Baseline: wakes up 3-4x/night and leaks every time; (9/28): 4x per night, not leaking every time walking to btahroom  Goal status: IN PROGRESS  PLAN: PT Frequency: 1x/week  PT Duration: 12 weeks  Planned Interventions: Therapeutic exercises, Therapeutic activity, Neuromuscular  re-education, Balance training, Gait training, Patient/Family education, Joint mobilization, Spinal mobilization, Moist heat, scar mobilization, Taping, and Manual therapy  Plan For Next Session: check hip, continue deep core, how was at night?  Mishael Krysiak, PT, DPT  05/08/2022, 10:02 AM

## 2022-05-12 DIAGNOSIS — M7581 Other shoulder lesions, right shoulder: Secondary | ICD-10-CM | POA: Diagnosis not present

## 2022-05-12 DIAGNOSIS — M75111 Incomplete rotator cuff tear or rupture of right shoulder, not specified as traumatic: Secondary | ICD-10-CM | POA: Diagnosis not present

## 2022-05-14 NOTE — Telephone Encounter (Signed)
Attempted to call to schedule Xolair injection. LVM

## 2022-05-15 ENCOUNTER — Ambulatory Visit: Payer: Medicare HMO

## 2022-05-15 DIAGNOSIS — M6289 Other specified disorders of muscle: Secondary | ICD-10-CM

## 2022-05-15 DIAGNOSIS — M6281 Muscle weakness (generalized): Secondary | ICD-10-CM

## 2022-05-15 DIAGNOSIS — R278 Other lack of coordination: Secondary | ICD-10-CM

## 2022-05-15 NOTE — Therapy (Signed)
OUTPATIENT PHYSICAL THERAPY FEMALE PELVIC TREATMENT   Patient Name: [name] MRN: [id number] DOB:1960/11/01, 61 y.o., female Today's Date: 05/15/2022   PT End of Session - 05/15/22 1010     Visit Number 9    Number of Visits 20    Date for PT Re-Evaluation 07/03/22    Authorization Type IE: 01/23/22, RC: 04/24/22    PT Start Time 1012    PT Stop Time 1052    PT Time Calculation (min) 40 min    Activity Tolerance Patient tolerated treatment well             Past Medical History:  Diagnosis Date   Allergy    Anemia    Arthritis    Asthma    Bacterial vaginitis    Blood in stool    Brachial neuritis or radiculitis NOS    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 being followed by neurosurgery/eye doctor and pcp. monitoring size.causes sinus problems   Bronchitis    Cardiac arrhythmia    Cervical neck pain with evidence of disc disease    C5/6 disease, MRI done late 2012 - no records available   Chronic combined systolic and diastolic CHF (congestive heart failure) ([place])    a. 08/2010 Echo: mildly reduced EF 40-45%, mild diffuse hypokinesis; b. 06/2016 Echo: EF 50%, no rwma, mild to mod TR; c. 03/2017 Echo: EF 40-45%, Gr1 DD (prior echo reviewed and EF felt to be lower than reported).   Chronic pain    Cocaine abuse, in remission (HCC)    clean x 24 years   Colon polyps    COPD (chronic obstructive pulmonary disease) ([place])    a. 12/2018 PFT: No obvious obst/restrictive dzs.   Coronary artery disease    a. PCI of LCX 2003; b. PCI of the LAD 2012 with a (2.5 x 8 mm BMS);  c.s/p CABG 4/12: L-LAD, VG-Dx, VG-OM, VG-RCA (Dr. Prescott Gum);  d. 01/2012 MV: inf infarct, attenuation, no ischemia; e. 02/2017 MV: signifi attenuation artifact. Fixed basal antlat/inflat scar vs artifact. Reversible apical lat and mid antlat defect - ? atten vs ischemia. F/u echo w/o wma->Med rx.   Depression    Diabetes mellitus without complication ([place])    Family history of colon cancer     Generalized headaches    frequent   GERD (gastroesophageal reflux disease)    Headache    Heart murmur    Hematochezia    Hepatitis    history of hepatitis b   History of cervical cancer    s/p cryotherapy   History of drug abuse ([place])    cocaine, marijuana, clean since 1989   History of hepatitis B    from eating undercooked liver   History of MI (myocardial infarction)    Hyperlipidemia    Hypertension    Ischemic cardiomyopathy    a. 03/2017 Echo: EF 40-45%, Gr1 DD.   Myocardial infarction ([place]) 2003, 2012   PAD (peripheral artery disease) ([place])    a. s/p Right SFA atherectomy and PTA 01/15/11; b. 07/2018 ABI: R 1.02, L 1.09.   Pituitary mass ([place])    a. 12/2018 MRI Brain: Stable pituitary mass w/ 69m area of necrosis. Mass abuts R cavernous sinus w/o definite invasion.   Polyp of colon    Seasonal allergies    Sleep apnea    uses cpap   Smoking history    quit 07/2010   Thyroid disease    Urinary incontinence  Past Surgical History:  Procedure Laterality Date   ABDOMINAL HYSTERECTOMY     2003   CHOLECYSTECTOMY  1986   COLONOSCOPY  2008   3 polyps   COLONOSCOPY WITH PROPOFOL N/A 02/06/2015   Procedure: COLONOSCOPY WITH PROPOFOL;  Surgeon: Lucilla Lame, MD;  Location: [facility] ENDOSCOPY;  Service: Endoscopy;  Laterality: N/A;   COLONOSCOPY WITH PROPOFOL N/A 01/27/2017   Procedure: COLONOSCOPY WITH PROPOFOL;  Surgeon: Lucilla Lame, MD;  Location: [facility] ENDOSCOPY;  Service: Endoscopy;  Laterality: N/A;   CORONARY ANGIOPLASTY     w/ stent placement x2   CORONARY ARTERY BYPASS GRAFT  2012   Dr [name]   CORONARY STENT PLACEMENT  2003   S/P MI   CORONARY STENT PLACEMENT  2007   [place]   ESOPHAGOGASTRODUODENOSCOPY (EGD) WITH PROPOFOL N/A 02/06/2015   Procedure: ESOPHAGOGASTRODUODENOSCOPY (EGD) WITH PROPOFOL;  Surgeon: Lucilla Lame, MD;  Location: [facility] ENDOSCOPY;  Service: Endoscopy;  Laterality: N/A;   ESOPHAGOGASTRODUODENOSCOPY (EGD) WITH PROPOFOL N/A 01/27/2017   Procedure:  ESOPHAGOGASTRODUODENOSCOPY (EGD) WITH PROPOFOL;  Surgeon: Lucilla Lame, MD;  Location: [facility] ENDOSCOPY;  Service: Endoscopy;  Laterality: N/A;   ESOPHAGOGASTRODUODENOSCOPY (EGD) WITH PROPOFOL N/A 09/01/2019   Procedure: ESOPHAGOGASTRODUODENOSCOPY (EGD) WITH PROPOFOL;  Surgeon: Lin Landsman, MD;  Location: [place];  Service: Gastroenterology;  Laterality: N/A;   FEMORAL ARTERY STENT  10/2010   right sided (Dr. Burt Knack)   KNEE ARTHROSCOPY WITH MEDIAL MENISECTOMY Right 08/20/2017   Procedure: KNEE ARTHROSCOPY WITH MEDIAL  AND LATERAL MENISECTOMY;  Surgeon: Hessie Knows, MD;  Location: [facility] ORS;  Service: Orthopedics;  Laterality: Right;   POSTERIOR CERVICAL LAMINECTOMY N/A 03/08/2018   Procedure: POSTERIOR CERVICAL LAMINECTOMY-C7;  Surgeon: Deetta Perla, MD;  Location: [facility] ORS;  Service: Neurosurgery;  Laterality: N/A;   TONSILLECTOMY     TOTAL KNEE ARTHROPLASTY Right 04/14/2019   Procedure: RIGHT TOTAL KNEE ARTHROPLASTY;  Surgeon: Hessie Knows, MD;  Location: [facility] ORS;  Service: Orthopedics;  Laterality: Right;   TUBAL LIGATION     Patient Active Problem List   Diagnosis Date Noted   COPD exacerbation ([place]) 03/12/2022   PND (post-nasal drip) 07/24/2021   Chronic pain of inguinal region 07/24/2021   Thrush 04/19/2021   Skin lesion 04/19/2021   Vaginal itching 04/19/2021   Vitamin D deficiency 01/14/2021   Breast mass, right 01/14/2021   Annual physical exam 01/14/2021   Pruritus of both eyes 09/21/2020   Lipoma of right lower extremity 08/30/2020   Hypertension associated with diabetes ([place]) 07/24/2020   Abnormality of both breasts on screening mammogram 05/14/2020   Bilateral hip pain 04/19/2020   Insomnia 04/19/2020   Chronic low back pain 02/22/2020   Internal hemorrhoids 02/22/2020   Benign breast cyst in female, right 02/22/2020   OSA on CPAP 12/15/2019   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[place]) 12/02/2019   Sensorimotor neuropathy 12/02/2019   Irritable bowel syndrome with both  constipation and diarrhea 10/13/2019   Mild intermittent asthma 10/13/2019   PUD (peptic ulcer disease)    Status post total knee replacement using cement, right 04/14/2019   Overactive bladder 03/10/2019   Mild persistent asthma 11/29/2018   DDD (degenerative disc disease), cervical 11/26/2018   Chronic constipation 11/26/2018   Fatty liver 11/26/2018   Adrenal adenoma, left 11/26/2018   Allergic rhinitis 08/26/2018   Bunion of right foot 08/26/2018   Fall 05/14/2018   Anxiety and depression 04/05/2018   Cervical radiculopathy 02/24/2018   Numbness and tingling in right hand 02/24/2018   Chronic neck pain 12/18/2017   Type 2  diabetes mellitus without complication, without long-term current use of insulin ([place]) 12/18/2017   Urinary incontinence 12/18/2017   Lumbar radiculopathy 12/18/2017   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   Abnormal feces    Benign neoplasm of ascending colon    Intractable vomiting with nausea    Acute esophagogastric ulcer    Gastritis without bleeding    Vasomotor flushing 11/24/2016   Morbid obesity ([place]) 10/29/2015   Back ache 06/19/2015   Colon polyp 06/19/2015   CCF (congestive cardiac failure) ([place]) 06/19/2015   Family history of colon cancer 06/19/2015   Orthostatic hypotension 04/30/2015   Hematochezia 01/11/2015   History of colonic polyps 01/11/2015   Atypical chest pain 01/20/2014   COPD with chronic bronchitis and emphysema ([place]) 01/03/2013   Right knee pain 12/20/2011   HTN (hypertension) 76/28/3151   Systolic CHF, chronic (HCC)    History of cervical cancer    Depression    GERD (gastroesophageal reflux disease)    Seasonal allergies    History of drug abuse ([place])    PVD (peripheral vascular disease) ([place]) 02/03/2011   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   Smoking history 12/16/2010   Hyperlipidemia 08/23/2010   Coronary artery disease involving native coronary artery of native heart without angina pectoris 08/23/2010   CLAUDICATION  08/23/2010   CAROTID BRUIT 08/23/2010   SHORTNESS OF BREATH 08/23/2010    PCP: [name], [name], MD   REFERRING PROVIDER: [name], [name], MD   REFERRING DIAG:  N32.81,N81.9 (ICD-10-CM) - Overactive bladder due to prolapse of female genital organ   THERAPY DIAG:  Pelvic floor dysfunction  Muscle weakness (generalized)  Other lack of coordination  Rationale for Evaluation and Treatment: Rehabilitation  ONSET DATE: A few years now  OCCUPATION/SOCIAL ACTIVITIES: Read, church, shopping, walking   PLOF: Independent  PERTINENT HISTORY/CHART REVIEW: CKD, CHF  Orland Mustard, MD (12/06/21) Plan Overactive bladder due to prolapse of female genital organ - Plan: Ambulatory referral to Physical Therapy, Vibegron (GEMTESA) 75 MG TABS, consider gyn or urogyn in the future if does not improve with PT   CHIEF CONCERN: Pt reports her kidneys are "so bad" and the urinary leakage has been happening for some years but this year has been the worst of it. Pt tries to perform kegels in order to hold her urine when she has the very strong urge to go to the bathroom (often). Pt is unable to make it to the bathroom without having to crunch over (forward trunk flexion) and squeeze her legs tight in order to not have leakage. However, most of the time the urinary leakage happens. Pt has to plan around where to go in the community and know where the bathroom is at all times. Pt has tried briefs and feels that her urine overflows and she has to constantly change her clothes. Pt has to change her clothes about 4-5x a day because she does not wear a brief. Pt also reports bedwetting occasionally because she cannot make it to the bathroom on time. Most recently, Pt has had bowel leakage with the urinary leakage as well. In some instances, she doesn't even notice the bowel leakage and notices when she wipes as her skin is very irritated and red. In regards to the prolapse, when Pt wipes  every time she does feel "something" there but Dr did not tell her about prolapse. Pt had a bedside commode and used for awhile but is currently not using. Pt is going to her kidney doctor today and stopped  taking the OAB medicine given because doctors are trying to figure out if the OAB medication is interfering with her kidneys/blood platelets.   LIVING ENVIRONMENT: Lives with: lives with their family Lives in: House/apartment   PATIENT GOALS: Pt be able to have control of her bladder, less leakage, not feeling a pressure at the vaginal opening. Pt would like to participate in sexual activity but fears about incontinence.     UROLOGICAL HISTORY Fluid intake: Yes: Water (tries a gallon a day per Dr.), fruit juice (pineapple, mango) , very occasionally a sprite Pain with urination: No Fully empty bladder: Yes Stream: Strong every time  Urgency: Yes:   Nocturia: 3-4x/night Frequency: "over 30x a day"  Leakage: Urge to void, Walking to the bathroom, Laughing, and Bending forward Pads: No because it hasn't helped and they cannot hold all the urine that comes out    GASTROINTESTINAL HISTORY  Pt has hx of constipation and is on medication (Linzess) Pain with bowel movement: No Type of bowel movement ([place] Stool Chart): Type 5 and 7  Fully empty rectum: Yes:   Leakage: Yes: occasionally, but just recently, does not know if that is related to the medication because it is watery , Type 7 on underwear when she notices but not everyday (2x/week) , having to wear 2 underwears in case leakage happens especially when out in the community  Pads: No   SEXUAL HISTORY/FUNCTION Pain with intercourse: No Finger stimulation: no pain, but has leakage and cannot continue  Not engaging in intercourse currently due to incontinence   OBSTETRICAL HISTORY Vaginal deliveries: G4P2 Tearing: Yes: with first child    GYNECOLOGICAL HISTORY Hysterectomy: yes, partial hysterectomy  Pelvic Organ  Prolapse: see above in pertinent hx, not graded  Heaviness/pressure: sometimes    SUBJECTIVE:  Pt has been a bit tired. Pt went to orthopedic doctor about R shoulder and will need surgery (tear of RC and lymphoma tumor removal).    PAIN:  Are you having pain? No NPRS scale: 0/10                                                                                                                                                                           OBJECTIVE:    COGNITION: Overall cognitive status: Within functional limits for tasks assessed     POSTURE:  Iliac crest height: L iliac crest  Pelvic obliquity: WNL   SENSATION:  Light touch:  L2-L4 intact, L5-S1 impaired, no dx of peripheral neuropathy in feet but has T2DM    LUMBAR AROM/PROM:   AROM (Normal range in degrees) AROM  02/06/22  Flexion (65) WNL  Extension (25-30) WNL  Right lateral flexion (25) WNL  Left lateral flexion (25)  WNL*  Right rotation (30) WNL  Left rotation (30) WNL*   (*= pain, Blank rows = not tested)  LOWER EXTREMITY ROM:    PROM (Normal range in degrees) Right 02/06/22 Left 02/06/22  Hip flexion (0-125) WNL WNL  Hip extension (0-15)    Hip abduction (0-40) WNL WNL  Hip adduction    Hip internal rotation (0-45) WNL* WNL*  Hip external rotation (0-45) WNL* WNL*  Knee flexion WNL WNL   (*= pain, Blank rows = not tested)  LOWER EXTREMITY MMT:   MMT Right 02/06/22 Left 02/06/22  Hip flexion 4 3*  Hip extension    Hip abduction    Hip adduction    Hip internal rotation 4 4  Hip external rotation 4 4  Knee flexion 4 3  Knee extension 5 5  Ankle dorsiflexion (in seated) 5 5  Ankle plantarflexion    Ankle inversion    Ankle eversion    (*= pain, Blank rows = not tested)   HIP SPECIAL TESTS:  FADDIR: + B with groin pain  FABER: +B with groin pain    PALPATION: Abdominal:  Diastasis: none  LLQ Tenderness upon superficial palpation, pain increased closer to ASIS  Iliopsoas  tender and produced lower abdomen pain with radiation to the groin   RUQ- Scar from gallbladder removal, scar tissue restriction noted along scar   RLQ- Tenderness upon deep palpation, but not as severe as the L  Towards pubic symphysis some discomfort/pain   B adductor musculature tender upon palpation    EXTERNAL PELVIC EXAM: Patient educated on the purpose of the pelvic exam and articulated understanding; patient consented to the exam verbally.  Breath coordination: present but inconsistent  Voluntary Contraction: present, 2/5 MMT with Valsalva maneuver  Relaxation: delayed  Perineal movement with sustained IAP increase ("bear down"): no change, then felt descent after cueing Perineal movement with rapid IAP increase ("cough"): no change  (0= no contraction, 1= flicker, 2= weak squeeze, 3= fair squeeze with lift, 4= good squeeze and lift against resistance, 5= strong squeeze against strong resistance)   TODAY'S TREATMENT  Neuromuscular Re-education: Pre-treatment assessment:  POSTURE:  Iliac crest height: equal iliac crests compared to IE (L iliac crest) Pelvic obliquity: WNL  Supine hooklying diaphragmatic breathing with VCs and TCs for downregulation of the nervous system and improved management of IAP  Supine 90/90 holds with diaphragmatic breathing for improved IAP management, 3x 25 secs, VCs and TCs required   Supine heel taps with LE at 90/90 and coordinated breathing for improved IAP management, VCs and TCs required     Patient response to interventions: Pt able to differentiate between TrA contraction and pelvic floor in seated   Patient Education:  Patient provided with HEP: seated TrA activation, supine marches with TrA activation. Patient educated throughout session on appropriate technique and form using multi-modal cueing, HEP, and activity modification. Patient will continue to benefit from further education in order to maximize compliance and understanding  for long-term therapeutic gains.    ASSESSMENT:  Clinical Impression: Patient presents to clinic with excellent motivation to participate in today's reassessment. Pt continues to demonstrates deficits in PFM coordination, PFM strength, PFM extensibility, pain, IAP management, LE strength, posture, and sensation. Pt overall has been doing well with recent information on requiring R shoulder surgery sometime in the near future. Upon posture assessment, Pt demonstrates equal B iliac crests compared to IE (L iliac crest elevate). Pt required moderate VCs and TCs Time taken to discuss urinary urgency  control via multiple techniques. Pt required moderate VCs and TCs for proper technique and to decrease bodily compensations (shoulder elevation and knee varus in standing) with progression of TrA activation in various positions. Pt responded positively to active and educational interventions. Pt will continue to benefit from skilled therapeutic intervention to address deficits in PFM coordination, PFM strength, PFM extensibility, pain, IAP management, LE strength, posture, and sensation in order to increase PLOF and improve overall QOL.    Objective Impairments: decreased activity tolerance, decreased coordination, decreased endurance, decreased strength, increased muscle spasms, improper body mechanics, and pain.   Activity Limitations: lifting, bending, sleeping, bed mobility, continence, toileting, hygiene/grooming, and locomotion level  Personal Factors: Age, Behavior pattern, Past/current experiences, Time since onset of injury/illness/exacerbation, and 3+ comorbidities: CHF, CKD, brain tumor benign, T2DM, HTN  are also affecting patient's functional outcome.   Rehab Potential: Good  Clinical Decision Making: Evolving/moderate complexity  Evaluation Complexity: Moderate   GOALS: Goals reviewed with patient? Yes  SHORT TERM GOALS: Target date: 06/05/22  Patient will improve scores on FOTO  Urinary Problem, Bowel Leakage, and Constipation by >/=7 points in order to demonstrate improved pelvic floor dysfunction with limited disruptions at home and in the community for an improved QOL.  Baseline: 37, 52, 60; (9/28): 44, 58, 68  Goal status: IN PROGRESS    LONG TERM GOALS: Target date: 07/03/2022  Patient will report confidence in ability to control bladder >/= 5/10 in order to demonstrate improved function and ability to participate more fully in activities at home and in the community. Baseline: 1/10; (9/28): 4/10  Goal status: IN PROGRESS  2.  Patient will report decreased instances of urinary leakage over a 3-week period in order to demonstrate improved bladder control and allow for increased participation in activities outside of the home. Baseline: urinary leakage every day (enough to make outer clothes wet)/changing 4-5x a day; (9/28): Pt is wearing incontinence pads in the community/2x per day Goal status: IN PROGRESS  3.  Patient will report decreased instances of bowel leakage over a 3-week period in order to demonstrate improved bladder control and allow for increased participation in activities outside of the home. Baseline: (More than once a week)/up to 2x; (9/28): once a week Goal status: IN PROGRESS  4.  Patient will report being able to return to activities including, but not limited to: walking, church functions, sexual activity without limitation to indicate complete resolution of the chief complaint and return to prior level of participation at home and in the community. Baseline: unable to attend a church service without bowel/bladder leakage/not participating in sexual activity due to leakage; (9/28): not participating in intercourse, still having urinary leakage in church but not as heavy as IE Goal status: IN PROGRESS  5.  Patient will report less than 5 incidents of stress urinary incontinence over the course of 3 weeks while laughing/bending/prolonged  activity in order to demonstrate improved PFM coordination, strength, and function for improved overall QOL. Baseline: every time she laughs/bends over; (9/28): still having urinary leakage but not every time Goal status: IN PROGRESS  6.  Patient will report decrease urinary leakage during the night in order to demonstrate improved PFM coordination, strength, improved sleep quality, and overall QOL.  Baseline: wakes up 3-4x/night and leaks every time; (9/28): 4x per night, not leaking every time walking to btahroom  Goal status: IN PROGRESS  PLAN: PT Frequency: 1x/week  PT Duration: 12 weeks  Planned Interventions: Therapeutic exercises, Therapeutic activity, Neuromuscular re-education, Balance training, Gait  training, Patient/Family education, Joint mobilization, Spinal mobilization, Moist heat, scar mobilization, Taping, and Manual therapy  Plan For Next Session: continue deep core standing/seated, how is everything going   [employer], PT, DPT  05/15/2022, 10:13 AM

## 2022-05-17 NOTE — Progress Notes (Signed)
Cardiology Clinic Note   Patient Name: [name] Date of Encounter: 05/19/2022  Primary Care Provider:  [name], Pasty Spillers, MD Primary Cardiologist:  Julien Nordmann, MD  Patient Profile    61 year old female with a history of CAD status post CABG, chronic combined systolic diastolic congestive heart failure, ischemic cardiomyopathy, hypertension, hyperlipidemia, diabetes, COPD, peripheral arterial disease, and sleep apnea who presents for follow-up.  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 being followed by neurosurgery/eye doctor and pcp. monitoring size.causes sinus problems   Bronchitis    Cardiac arrhythmia    Cervical neck pain with evidence of disc disease    C5/6 disease, MRI done late 2012 - no records available   Chronic combined systolic and diastolic CHF (congestive heart failure) (HCC)    a. 08/2010 Echo: mildly reduced EF 40-45%, mild diffuse hypokinesis; b. 06/2016 Echo: EF 50%, no rwma, mild to mod TR; c. 03/2017 Echo: EF 40-45%, Gr1 DD (prior echo reviewed and EF felt to be lower than reported).   Chronic pain    Cocaine abuse, in remission (HCC)    clean x 24 years   Colon polyps    COPD (chronic obstructive pulmonary disease) (HCC)    a. 12/2018 PFT: No obvious obst/restrictive dzs.   Coronary artery disease    a. PCI of LCX 2003; b. PCI of the LAD 2012 with a (2.5 x 8 mm BMS);  c.s/p CABG 4/12: L-LAD, VG-Dx, VG-OM, VG-RCA (Dr. Donata Clay);  d. 01/2012 MV: inf infarct, attenuation, no ischemia; e. 02/2017 MV: signifi attenuation artifact. Fixed basal antlat/inflat scar vs artifact. Reversible apical lat and mid antlat defect - ? atten vs ischemia. F/u echo w/o wma->Med rx.   Depression    Diabetes mellitus without complication (HCC)    Family  history of colon cancer    Generalized headaches    frequent   GERD (gastroesophageal reflux disease)    Headache    Heart murmur    Hematochezia    Hepatitis    history of hepatitis b   History of cervical cancer    s/p cryotherapy   History of drug abuse (HCC)    cocaine, marijuana, clean since 1989   History of hepatitis B    from eating undercooked liver   History of MI (myocardial infarction)    Hyperlipidemia    Hypertension    Ischemic cardiomyopathy    a. 03/2017 Echo: EF 40-45%, Gr1 DD.   Myocardial infarction (HCC) 2003, 2012   PAD (peripheral artery disease) (HCC)    a. s/p Right SFA atherectomy and PTA 01/15/11; b. 07/2018 ABI: R 1.02, L 1.09.   Pituitary mass (HCC)    a. 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 Mass abuts R cavernous sinus w/o definite invasion.   Polyp of colon    Seasonal allergies    Sleep apnea    uses cpap   Smoking history    quit 07/2010   Thyroid disease    Urinary incontinence    Past Surgical History:  Procedure Laterality Date   ABDOMINAL HYSTERECTOMY     2003   CHOLECYSTECTOMY  1986   COLONOSCOPY  2008  3 polyps   COLONOSCOPY WITH PROPOFOL N/A 02/06/2015   Procedure: COLONOSCOPY WITH PROPOFOL;  Surgeon: Midge Minium, MD;  Location: [facility] ENDOSCOPY;  Service: Endoscopy;  Laterality: N/A;   COLONOSCOPY WITH PROPOFOL N/A 01/27/2017   Procedure: COLONOSCOPY WITH PROPOFOL;  Surgeon: Midge Minium, MD;  Location: [facility] ENDOSCOPY;  Service: Endoscopy;  Laterality: N/A;   CORONARY ANGIOPLASTY     w/ stent placement x2   CORONARY ARTERY BYPASS GRAFT  2012   Dr [name]   CORONARY STENT PLACEMENT  2003   S/P MI   CORONARY STENT PLACEMENT  2007   [place]   ESOPHAGOGASTRODUODENOSCOPY (EGD) WITH PROPOFOL N/A 02/06/2015   Procedure: ESOPHAGOGASTRODUODENOSCOPY (EGD) WITH PROPOFOL;  Surgeon: Midge Minium, MD;  Location: [facility] ENDOSCOPY;  Service: Endoscopy;  Laterality: N/A;   ESOPHAGOGASTRODUODENOSCOPY (EGD) WITH PROPOFOL N/A  01/27/2017   Procedure: ESOPHAGOGASTRODUODENOSCOPY (EGD) WITH PROPOFOL;  Surgeon: Midge Minium, MD;  Location: [facility] ENDOSCOPY;  Service: Endoscopy;  Laterality: N/A;   ESOPHAGOGASTRODUODENOSCOPY (EGD) WITH PROPOFOL N/A 09/01/2019   Procedure: ESOPHAGOGASTRODUODENOSCOPY (EGD) WITH PROPOFOL;  Surgeon: Toney Reil, MD;  Location: [facility] ENDOSCOPY;  Service: Gastroenterology;  Laterality: N/A;   FEMORAL ARTERY STENT  10/2010   right sided (Dr. Excell Seltzer)   KNEE ARTHROSCOPY WITH MEDIAL MENISECTOMY Right 08/20/2017   Procedure: KNEE ARTHROSCOPY WITH MEDIAL  AND LATERAL MENISECTOMY;  Surgeon: Kennedy Bucker, MD;  Location: [facility] ORS;  Service: Orthopedics;  Laterality: Right;   POSTERIOR CERVICAL LAMINECTOMY N/A 03/08/2018   Procedure: POSTERIOR CERVICAL LAMINECTOMY-C7;  Surgeon: Lucy Chris, MD;  Location: [facility] ORS;  Service: Neurosurgery;  Laterality: N/A;   TONSILLECTOMY     TOTAL KNEE ARTHROPLASTY Right 04/14/2019   Procedure: RIGHT TOTAL KNEE ARTHROPLASTY;  Surgeon: Kennedy Bucker, MD;  Location: [facility] ORS;  Service: Orthopedics;  Laterality: Right;   TUBAL LIGATION      Allergies  Allergies  Allergen Reactions   Latex Rash        Shellfish Allergy Anaphylaxis    Hard shellfish/swelling in throat   Sulfa Antibiotics Anaphylaxis    Swelling in throat.   Sulfonamide Derivatives Anaphylaxis    Swelling in throat.   Watermelon [Citrullus Vulgaris] Other (See Comments)    Throat itchy   Penicillins Itching   Soy Allergy     Diarrhea/upset stomach     Tomato Itching    Throat itchy - also Raw carrots   Other Swelling    History of Present Illness    [name] is a 61 year old female with the above complex past medical history including coronary artery disease status post CABG x4 in 2012, chronic combined systolic and diastolic congestive heart failure, ischemic cardiomyopathy, hypertension, hyperlipidemia, diabetes, COPD, peripheral arterial disease status post right SFA arthrectomy  and PTA in June 2012, and sleep apnea.  She last underwent stress test in September 2018 as part of her preoperative clearance for right knee surgery in the setting of her medial meniscus tear.  The sensitivity and specificity of the study was limited by significant artifact however, there were no areas of ischemia noted.  Echocardiogram showed stable mild LV dysfunction and an EF of 40-45%.  She was cleared for surgery which took place 07/2017.  In the setting of vascular disease she was on low-dose Xarelto 2.5 mg twice daily added to her medication regimen in 2019.  She was evaluated in the [facility] emergency department 03/11/2022 where she was found to have hemoptysis.  Vital signs are stable.  High-sensitivity troponins were negative x2.  CTA of the chest ruled  out pulmonary embolism.  CT of the head was negative.  And she was subsequently discharged home with outpatient follow-up.  Was last seen in clinic in 03/18/2022 after her emergency department visit.  She had had 1 episode of blood-streaked sputum and some shortness of breath over the last week.  She stated she had also suffered from a recent sinus infection.  She was scheduled for a repeat echocardiogram and Myoview Lexiscan that had previously been ordered that she was unable to complete due to urinary tract infections and weak pelvic floor.  Also no medication changes made to her current regimen.  Stress testing was completed/7/23 which showed poor perfusion uptake on stress imaging with significant GI uptake artifact, no resting images were available for review despite image reprocessing, ejection fraction estimated 44%, unable to exclude perfusion abnormality, Considering alternate study if continued symptoms.  Echocardiogram was completed 04/17/2022 which revealed an LVEF 45-50%, no regional wall motion abnormalities, G2 DD, mildly dilated left atrium, mild to moderate mitral regurgitation, moderate tricuspid regurgitation.  After echocardiogram was  completed her Lasix was increased to taking 1 furosemide daily with potassium supplementation and additional Lasix 3 days a week with extra potassium.  She returns to clinic today stating that overall she has been doing fairly well.  She has not had any recurrent episodes of chest discomfort, shortness of breath, dizziness or lightheadedness.  She continues to suffer from shoulder pain that would later require surgery.  She said she is waiting for surgery closer towards the beginning of next year.  She has been tolerating increased amount of the Lasix she was prescribed after recent echocardiogram without incident.  She also denies any recurrent hospitalizations or visits to the emergency department.  Home Medications    Current Outpatient Medications  Medication Sig Dispense Refill   albuterol (VENTOLIN HFA) 108 (90 Base) MCG/ACT inhaler Inhale 2 puffs into the lungs every 6 (six) hours as needed for wheezing or shortness of breath. 1 g 12   aspirin EC 81 MG tablet Take 1 tablet (81 mg total) by mouth daily. 90 tablet 3   Azelastine HCl 0.15 % SOLN Place 2 sprays into both nostrils daily as needed (allergies). 30 mL 11   benzonatate (TESSALON) 100 MG capsule Take 1 capsule (100 mg total) by mouth 3 (three) times daily as needed for cough. 60 capsule 0   clobetasol cream (TEMOVATE) 0.05 % Apply 1 application. topically 2 (two) times daily. Prn left elbow 30 g 0   Cyanocobalamin (VITAMIN B-12) 1000 MCG SUBL Place 0.001 tablets (1 mcg total) under the tongue daily. 90 tablet 3   cyclobenzaprine (FLEXERIL) 5 MG tablet Take 1-2 tablets (5-10 mg total) by mouth daily as needed for muscle spasms. 60 tablet 11   dapagliflozin propanediol (FARXIGA) 10 MG TABS tablet Take 1 tablet (10 mg total) by mouth daily before breakfast. 90 tablet 3   dicyclomine (BENTYL) 10 MG capsule Take 1 capsule (10 mg total) by mouth 3 (three) times daily before meals. As needed for abdominal pain 120 capsule 11    diphenhydramine-acetaminophen (TYLENOL PM) 25-500 MG TABS tablet Take 1 tablet by mouth as needed.     EPINEPHrine 0.3 mg/0.3 mL IJ SOAJ injection Inject one pen into the thigh as needed for anaphylactic reaction. Then CALL 911. Use second pen if needed thereafter 2 each 5   esomeprazole (NEXIUM) 40 MG capsule Take 1 capsule by mouth once daily 90 capsule 0   Evolocumab (REPATHA SURECLICK) 140 MG/ML SOAJ INJECT  140MG  INTO THE SKIN ONCE EVERY 14 DAYS 2 mL 3   ezetimibe (ZETIA) 10 MG tablet Take 1 tablet (10 mg total) by mouth daily. 90 tablet 3   fluticasone (FLONASE) 50 MCG/ACT nasal spray Place 2 sprays into both nostrils daily. Prn nasal congestion 16 g 11   Fluticasone-Umeclidin-Vilant (TRELEGY ELLIPTA) 100-62.5-25 MCG/ACT AEPB Inhale 1 puff into the lungs daily. Rinse mouth 1 each 11   furosemide (LASIX) 20 MG tablet TAKE 1 TABLET BY MOUTH ONCE DAILY AND 1 TABLET AT LUNCH THREE TIMES WEEKLY 180 tablet 0   hydrOXYzine (ATARAX) 25 MG tablet TAKE 1 TO 2 TABLETS BY MOUTH ONCE DAILY AS NEEDED 60 tablet 11   ipratropium (ATROVENT) 0.06 % nasal spray Place 2 sprays into both nostrils 4 (four) times daily. 15 mL 12   ipratropium-albuterol (DUONEB) 0.5-2.5 (3) MG/3ML SOLN USE 1 VIAL IN NEBULIZER TWICE DAILY AS NEEDED FOR WHEEZING OR SHORTNESS OF BREATH 360 mL 3   isosorbide mononitrate (IMDUR) 30 MG 24 hr tablet Take 1 tablet by mouth twice daily 180 tablet 2   levocetirizine (XYZAL) 5 MG tablet Take 1 tablet (5 mg total) by mouth at bedtime as needed for allergies. 90 tablet 3   linaclotide (LINZESS) 72 MCG capsule TAKE 1 CAPSULE BY MOUTH ONCE DAILY BEFORE BREAKFAST 90 capsule 3   meclizine (ANTIVERT) 25 MG tablet TAKE 1 TABLET BY MOUTH TWICE DAILY AS NEEDED FOR DIZZINESS 60 tablet 11   metoprolol succinate (TOPROL-XL) 50 MG 24 hr tablet TAKE 1 TABLET BY MOUTH ONCE DAILY WITH OR IMMEDIATELY FOLLOWING A MEAL 90 tablet 0   montelukast (SINGULAIR) 10 MG tablet Take 1 tablet (10 mg total) by mouth at  bedtime. 90 tablet 3   nitroGLYCERIN (NITROSTAT) 0.4 MG SL tablet DISSOLVE ONE TABLET UNDER THE TONGUE EVERY 5 MINUTES AS NEEDED FOR CHEST PAIN.  DO NOT EXCEED A TOTAL OF 3 DOSES IN 15 MINUTES 25 tablet 2   nystatin (MYCOSTATIN) 100000 UNIT/ML suspension Take 5 mLs (500,000 Units total) by mouth 4 (four) times daily. (Patient taking differently: Take 5 mLs by mouth as needed.) 240 mL 0   olopatadine (PATANOL) 0.1 % ophthalmic solution Place 1 drop into both eyes 2 (two) times daily. 15 mL 11   ondansetron (ZOFRAN) 4 MG tablet Take 1 tablet (4 mg total) by mouth every 8 (eight) hours as needed for nausea or vomiting. 20 tablet 5   OneTouch Delica Lancets 33G MISC USE TO CHECK GLUCOSE IN THE MORNING AND AT BEDTIME 200 each 0   ONETOUCH VERIO test strip USE 1 STRIP TO CHECK GLUCOSE TWICE DAILY ONCE  IN  THE  MORNING  AND  ONCE  AT  BEDTIME 200 each 0   PARoxetine (PAXIL) 20 MG tablet Take 1 tablet (20 mg total) by mouth daily. 90 tablet 3   potassium chloride (KLOR-CON M) 10 MEQ tablet Take 1 tablet (10 mEq total) by mouth daily. TAKE 1 TABLET ONCE DAILY AND 1 TABLET WITH FUROSEMIDE AT LUNCH THREE TIMES A WEEK 90 tablet 3   pregabalin (LYRICA) 75 MG capsule Take 1 capsule by mouth twice daily 60 capsule 5   rivaroxaban (XARELTO) 2.5 MG TABS tablet Take 1 tablet by mouth twice daily 180 tablet 1   rosuvastatin (CRESTOR) 20 MG tablet TAKE 1 TABLET BY MOUTH AT BEDTIME 90 tablet 0   sodium chloride (OCEAN) 0.65 % SOLN nasal spray Place 2 sprays into both nostrils daily as needed for congestion. 30 mL 11   traZODone (  DESYREL) 50 MG tablet Take 0.5-1 tablets (25-50 mg total) by mouth at bedtime as needed for sleep. 90 tablet 3   Vibegron (GEMTESA) 75 MG TABS Take 75 mg by mouth daily. D/c myrbetriq 90 tablet 3   No current facility-administered medications for this visit.     Family History    Family History  Problem Relation Age of Onset   Other Mother    Breast cancer Mother 16       breast cancer,  late 27's   Cancer Mother        breast   Hypertension Father    Heart failure Father    Diabetes Father    Colon cancer Father 85   Glaucoma Father    Cancer Father        colorectal    Heart disease Father    Other Father        glaucoma   Brain cancer Sister    Arthritis Sister    Diabetes Sister    Hypertension Sister    Kidney disease Sister    Diabetes Brother    Hypertension Brother    Acute myelogenous leukemia Grandson        06/2019   Coronary artery disease Neg Hx    Stroke Neg Hx    She indicated that her mother is deceased. She indicated that her father is deceased. She indicated that both of her sisters are alive. She indicated that the status of her brother is unknown. She indicated that the status of her neg hx is unknown. She indicated that the status of her grandson is unknown.  Social History    Social History   Socioeconomic History   Marital status: Legally Separated    Spouse name: Not on file   Number of children: Not on file   Years of education: Not on file   Highest education level: Not on file  Occupational History   Not on file  Tobacco Use   Smoking status: Former    Packs/day: 2.00    Years: 38.00    Total pack years: 76.00    Types: Cigarettes    Quit date: 08/12/2010    Years since quitting: 11.7   Smokeless tobacco: Never  Vaping Use   Vaping Use: Never used  Substance and Sexual Activity   Alcohol use: No   Drug use: No    Types: Cocaine    Comment: Remote Hx (crack cocaine and marijuana).none since 42yrs plus   Sexual activity: Yes  Other Topics Concern   Not on file  Social History Narrative   Caffeine: 1 cup coffee/day   Lives with family, no pets   Occupation: industrial work, 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   Edu: 11th grade   Activity: no regular exercise   Diet: good water, vegetables daily, low salt diet   Lives with sister and other family    No guns, wears seat belts, safe in relationship    2 kids    GED 1  year of college    Social Determinants of Health   Financial Resource Strain: Low Risk  (11/22/2021)   Overall Financial Resource Strain (CARDIA)    Difficulty of Paying Living Expenses: Not hard at all  Food Insecurity: No Food Insecurity (11/22/2021)   Hunger Vital Sign    Worried About Running Out of Food in the Last Year: Never true    Ran Out of Food in the Last Year: Never true  Transportation Needs: No Transportation  Needs (11/22/2021)   PRAPARE - Administrator, Civil Service (Medical): No    Lack of Transportation (Non-Medical): No  Physical Activity: Unknown (08/03/2020)   Exercise Vital Sign    Days of Exercise per Week: 0 days    Minutes of Exercise per Session: Not on file  Stress: No Stress Concern Present (08/06/2021)   [employer] of Occupational Health - Occupational Stress Questionnaire    Feeling of Stress : Not at all  Social Connections: Unknown (08/06/2021)   Social Connection and Isolation Panel [NHANES]    Frequency of Communication with Friends and Family: More than three times a week    Frequency of Social Gatherings with Friends and Family: Once a week    Attends Religious Services: 1 to 4 times per year    Active Member of [employer] or Organizations: Yes    Attends Engineer, structural: More than 4 times per year    Marital Status: Not on file  Intimate Partner Violence: Not At Risk (11/22/2021)   Humiliation, Afraid, Rape, and Kick questionnaire    Fear of Current or Ex-Partner: No    Emotionally Abused: No    Physically Abused: No    Sexually Abused: No     Review of Systems    General:  No chills, fever, night sweats or weight changes.  Endorses exertional fatigue Cardiovascular:  No chest pain, dyspnea on exertion, edema, orthopnea, palpitations, paroxysmal nocturnal dyspnea. Dermatological: No rash, lesions/masses Respiratory: No cough, dyspnea Urologic: No hematuria, dysuria Abdominal:   No nausea, vomiting, diarrhea, bright  red blood per rectum, melena, or hematemesis Neurologic:  No visual changes, wkns, changes in mental status. All other systems reviewed and are otherwise negative except as noted above.   Physical Exam    VS:  BP 100/80 (BP Location: Right Arm, Patient Position: Sitting, Cuff Size: Normal)   Pulse 69   Ht 5\' 5"  (1.651 m)   Wt 211 lb 6.4 oz (95.9 kg)   SpO2 98%   BMI 35.18 kg/m  , BMI Body mass index is 35.18 kg/m.     GEN: Well nourished, well developed, in no acute distress. HEENT: normal.  Glasses on Neck: Supple, no JVD, carotid bruits, or masses. Cardiac: RRR, no murmurs, rubs, or gallops. No clubbing, cyanosis, edema.  Radials/DP/PT 2+ and equal bilaterally.  Respiratory:  Respirations regular and unlabored, clear to auscultation bilaterally. GI: Soft, nontender, nondistended, BS + x 4. MS: no deformity or atrophy. Skin: warm and dry, no rash. Neuro:  Strength and sensation are intact. Psych: Normal affect.  Accessory Clinical Findings    ECG personally reviewed by me today-no new tracings were completed today  Lab Results  Component Value Date   WBC 4.5 03/11/2022   HGB 15.5 (H) 03/11/2022   HCT 48.5 (H) 03/11/2022   MCV 95.7 03/11/2022   PLT 152 03/11/2022   Lab Results  Component Value Date   CREATININE 1.04 (H) 05/19/2022   BUN 18 05/19/2022   NA 141 05/19/2022   K 3.6 05/19/2022   CL 107 05/19/2022   CO2 26 05/19/2022   Lab Results  Component Value Date   ALT 16 12/06/2021   AST 18 12/06/2021   ALKPHOS 80 12/06/2021   BILITOT 0.4 12/06/2021   Lab Results  Component Value Date   CHOL 134 12/06/2021   HDL 43.70 12/06/2021   LDLCALC 68 12/06/2021   TRIG 113.0 12/06/2021   CHOLHDL 3 12/06/2021    Lab Results  Component Value Date   HGBA1C 6.3 12/06/2021    Assessment & Plan   1.  Coronary artery disease status post CABG with stable angina and atypical and typical symptoms pressure tightness.  She did undergo MPI testing that was complicated  due to a large GI uptake.  Echocardiogram was without wall motion abnormalities.  Without continued symptoms no further testing was ordered at this time.  She is to continue with aspirin, Zetia, Imdur, rosuvastatin, Nitrostat as needed.  2.  Ischemic cardiomyopathy with previous LVEF 40-45% , G1 DD in 03/2017.  Most recent echocardiogram revealed LVEF of 45-50%, no regional wall motion abnormalities, G2 DD, right ventricular systolic function was moderately reduced, mildly elevated pulmonary artery systolic pressures, mild to moderate mitral valve regurgitation and moderate tricuspid regurgitation.  She is continued on increased amount of Lasix with repeat BMP ordered today.  3.  Essential hypertension with blood pressure of 100/80.  She is to continue with her current medication regimen without changes made today.  She will continue to monitor her blood pressure at home as well.  4.  Hyperlipidemia with LDL 68.  She is to continue on Zetia and rosuvastatin.  No changes were made to her medication regimen today.  This continues to be managed by her PCP.  5.  Peripheral arterial disease status post arthrectomy to the right SFA on 01/15/2011.  She continues to have palpable pulses to bilateral lower extremities and denies any claudication symptoms today.  She is to continue with Xarelto 2.5 mg twice daily.  6.  Obstructive sleep apnea with continued compliance with CPAP with the patient  7.  Disposition patient return to clinic to see MD/APP in 3 months or sooner if needed.  Clell Trahan, NP 05/19/2022, 12:30 PM

## 2022-05-19 ENCOUNTER — Encounter: Payer: Self-pay | Admitting: Cardiology

## 2022-05-19 ENCOUNTER — Other Ambulatory Visit
Admission: RE | Admit: 2022-05-19 | Discharge: 2022-05-19 | Disposition: A | Discharge status: Home / Self Care | Payer: Medicare HMO | Source: Ambulatory Visit | Attending: Cardiology | Admitting: Cardiology

## 2022-05-19 ENCOUNTER — Ambulatory Visit: Payer: Medicare HMO | Attending: Cardiology | Admitting: Cardiology

## 2022-05-19 VITALS — BP 100/80 | HR 69 | Ht 65.0 in | Wt 211.4 lb

## 2022-05-19 DIAGNOSIS — I10 Essential (primary) hypertension: Secondary | ICD-10-CM | POA: Diagnosis not present

## 2022-05-19 DIAGNOSIS — G4733 Obstructive sleep apnea (adult) (pediatric): Secondary | ICD-10-CM | POA: Diagnosis not present

## 2022-05-19 DIAGNOSIS — I255 Ischemic cardiomyopathy: Secondary | ICD-10-CM | POA: Diagnosis not present

## 2022-05-19 DIAGNOSIS — E785 Hyperlipidemia, unspecified: Secondary | ICD-10-CM

## 2022-05-19 DIAGNOSIS — I5022 Chronic systolic (congestive) heart failure: Secondary | ICD-10-CM | POA: Insufficient documentation

## 2022-05-19 DIAGNOSIS — I739 Peripheral vascular disease, unspecified: Secondary | ICD-10-CM

## 2022-05-19 DIAGNOSIS — I25118 Atherosclerotic heart disease of native coronary artery with other forms of angina pectoris: Secondary | ICD-10-CM

## 2022-05-19 LAB — BASIC METABOLIC PANEL
Anion gap: 8 (ref 5–15)
BUN: 18 mg/dL (ref 8–23)
CO2: 26 mmol/L (ref 22–32)
Calcium: 9.4 mg/dL (ref 8.9–10.3)
Chloride: 107 mmol/L (ref 98–111)
Creatinine, Ser: 1.04 mg/dL — ABNORMAL HIGH (ref 0.44–1.00)
GFR, Estimated: >60 mL/min (ref >60)
Glucose, Bld: 98 mg/dL (ref 70–99)
Potassium: 3.6 mmol/L (ref 3.5–5.1)
Sodium: 141 mmol/L (ref 135–145)

## 2022-05-19 NOTE — Patient Instructions (Signed)
Medication Instructions:   Your physician recommends that you continue on your current medications as directed. Please refer to the Current Medication list given to you today.  *If you need a refill on your cardiac medications before your next appointment, please call your pharmacy*   Lab Work:  Your physician recommends that you Have blood worj done at the medical mall - BMP  If you have labs (blood work) drawn today and your tests are completely normal, you will receive your results only by: [place] (if you have MyChart) OR A paper copy in the mail If you have any lab test that is abnormal or we need to change your treatment, we will call you to review the results.   Testing/Procedures:  None Ordered   Follow-Up: At [facility], you and your health needs are our priority.  As part of our continuing mission to provide you with exceptional heart care, we have created designated Provider Care Teams.  These Care Teams include your primary Cardiologist (physician) and Advanced Practice Providers (APPs -  Physician Assistants and Nurse Practitioners) who all work together to provide you with the care you need, when you need it.  We recommend signing up for the patient portal called "MyChart".  Sign up information is provided on this After Visit Summary.  MyChart is used to connect with patients for Virtual Visits (Telemedicine).  Patients are able to view lab/test results, encounter notes, upcoming appointments, etc.  Non-urgent messages can be sent to your provider as well.   To learn more about what you can do with MyChart, go to [email or web address].    Your next appointment:   3 month(s)  The format for your next appointment:   In Person  Provider:   You may see Ida Rogue, MD or one of the following Advanced Practice Providers on your designated Care Team:   Murray Hodgkins, NP Christell Faith, PA-C Cadence Kathlen Mody, PA-C Gerrie Nordmann, NP

## 2022-05-20 ENCOUNTER — Other Ambulatory Visit: Payer: Self-pay | Admitting: Cardiovascular Disease

## 2022-05-21 ENCOUNTER — Telehealth: Payer: Self-pay

## 2022-05-21 NOTE — Telephone Encounter (Signed)
Pt advised the pt her lab results and she verbalized that she is taking her K 10 meq daily and also at noon on the days that she takes her Lasix three times a week... I will forward to the provider for her review.

## 2022-05-21 NOTE — Telephone Encounter (Signed)
-----   Message from Gerrie Nordmann, NP sent at 05/19/2022  3:08 PM EDT ----- Please let Ms. Aracena know that her kidney function is stable. Her potassium in trending down. Make sure she is taking an additional potassium with her previously prescribed additional lasix.

## 2022-05-22 ENCOUNTER — Ambulatory Visit: Payer: Medicare HMO

## 2022-05-24 DIAGNOSIS — G4733 Obstructive sleep apnea (adult) (pediatric): Secondary | ICD-10-CM | POA: Diagnosis not present

## 2022-05-24 DIAGNOSIS — G4710 Hypersomnia, unspecified: Secondary | ICD-10-CM | POA: Diagnosis not present

## 2022-05-24 DIAGNOSIS — I259 Chronic ischemic heart disease, unspecified: Secondary | ICD-10-CM | POA: Diagnosis not present

## 2022-05-29 ENCOUNTER — Ambulatory Visit: Payer: Medicare HMO

## 2022-06-02 DIAGNOSIS — G4733 Obstructive sleep apnea (adult) (pediatric): Secondary | ICD-10-CM | POA: Diagnosis not present

## 2022-06-02 DIAGNOSIS — I259 Chronic ischemic heart disease, unspecified: Secondary | ICD-10-CM | POA: Diagnosis not present

## 2022-06-02 DIAGNOSIS — G4710 Hypersomnia, unspecified: Secondary | ICD-10-CM | POA: Diagnosis not present

## 2022-06-05 ENCOUNTER — Ambulatory Visit: Payer: Medicare HMO | Attending: Internal Medicine

## 2022-06-05 DIAGNOSIS — R278 Other lack of coordination: Secondary | ICD-10-CM | POA: Diagnosis not present

## 2022-06-05 DIAGNOSIS — M6281 Muscle weakness (generalized): Secondary | ICD-10-CM | POA: Diagnosis not present

## 2022-06-05 DIAGNOSIS — M6289 Other specified disorders of muscle: Secondary | ICD-10-CM | POA: Diagnosis not present

## 2022-06-05 NOTE — Therapy (Signed)
OUTPATIENT PHYSICAL THERAPY FEMALE PELVIC PROGRESS NOTE  FROM REPORTING PERIOD 01/23/22 - 06/05/22   Patient Name: [name] MRN: [id number] DOB:23-Jun-1961, 61 y.o., female Today's Date: 06/06/2022   PT End of Session - 06/05/22 1525     Visit Number 10    Number of Visits 20    Date for PT Re-Evaluation 07/03/22    Authorization Type IE: 01/23/22, RC: 04/24/22    PT Start Time 1528    PT Stop Time 1610    PT Time Calculation (min) 42 min    Activity Tolerance Patient tolerated treatment well             Past Medical History:  Diagnosis Date   Allergy    Anemia    Arthritis    Asthma    Bacterial vaginitis    Blood in stool    Brachial neuritis or radiculitis NOS    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 being followed by neurosurgery/eye doctor and pcp. monitoring size.causes sinus problems   Bronchitis    Cardiac arrhythmia    Cervical neck pain with evidence of disc disease    C5/6 disease, MRI done late 2012 - no records available   Chronic combined systolic and diastolic CHF (congestive heart failure) ([place])    a. 08/2010 Echo: mildly reduced EF 40-45%, mild diffuse hypokinesis; b. 06/2016 Echo: EF 50%, no rwma, mild to mod TR; c. 03/2017 Echo: EF 40-45%, Gr1 DD (prior echo reviewed and EF felt to be lower than reported).   Chronic pain    Cocaine abuse, in remission (HCC)    clean x 24 years   Colon polyps    COPD (chronic obstructive pulmonary disease) ([place])    a. 12/2018 PFT: No obvious obst/restrictive dzs.   Coronary artery disease    a. PCI of LCX 2003; b. PCI of the LAD 2012 with a (2.5 x 8 mm BMS);  c.s/p CABG 4/12: L-LAD, VG-Dx, VG-OM, VG-RCA (Dr. Prescott Gum);  d. 01/2012 MV: inf infarct, attenuation, no ischemia; e. 02/2017 MV: signifi attenuation artifact. Fixed basal antlat/inflat scar vs artifact. Reversible apical lat and mid antlat defect - ? atten vs ischemia. F/u echo w/o wma->Med rx.   Depression    Diabetes mellitus without  complication ([place])    Family history of colon cancer    Generalized headaches    frequent   GERD (gastroesophageal reflux disease)    Headache    Heart murmur    Hematochezia    Hepatitis    history of hepatitis b   History of cervical cancer    s/p cryotherapy   History of drug abuse ([place])    cocaine, marijuana, clean since 1989   History of hepatitis B    from eating undercooked liver   History of MI (myocardial infarction)    Hyperlipidemia    Hypertension    Ischemic cardiomyopathy    a. 03/2017 Echo: EF 40-45%, Gr1 DD.   Myocardial infarction ([place]) 2003, 2012   PAD (peripheral artery disease) ([place])    a. s/p Right SFA atherectomy and PTA 01/15/11; b. 07/2018 ABI: R 1.02, L 1.09.   Pituitary mass ([place])    a. 12/2018 MRI Brain: Stable pituitary mass w/ 77m area of necrosis. Mass abuts R cavernous sinus w/o definite invasion.   Polyp of colon    Seasonal allergies    Sleep apnea    uses cpap   Smoking history    quit 07/2010   Thyroid  disease    Urinary incontinence    Past Surgical History:  Procedure Laterality Date   ABDOMINAL HYSTERECTOMY     2003   CHOLECYSTECTOMY  1986   COLONOSCOPY  2008   3 polyps   COLONOSCOPY WITH PROPOFOL N/A 02/06/2015   Procedure: COLONOSCOPY WITH PROPOFOL;  Surgeon: Lucilla Lame, MD;  Location: [facility] ENDOSCOPY;  Service: Endoscopy;  Laterality: N/A;   COLONOSCOPY WITH PROPOFOL N/A 01/27/2017   Procedure: COLONOSCOPY WITH PROPOFOL;  Surgeon: Lucilla Lame, MD;  Location: [facility] ENDOSCOPY;  Service: Endoscopy;  Laterality: N/A;   CORONARY ANGIOPLASTY     w/ stent placement x2   CORONARY ARTERY BYPASS GRAFT  2012   Dr [name]   CORONARY STENT PLACEMENT  2003   S/P MI   CORONARY STENT PLACEMENT  2007   [place]   ESOPHAGOGASTRODUODENOSCOPY (EGD) WITH PROPOFOL N/A 02/06/2015   Procedure: ESOPHAGOGASTRODUODENOSCOPY (EGD) WITH PROPOFOL;  Surgeon: Lucilla Lame, MD;  Location: [facility] ENDOSCOPY;  Service: Endoscopy;  Laterality: N/A;    ESOPHAGOGASTRODUODENOSCOPY (EGD) WITH PROPOFOL N/A 01/27/2017   Procedure: ESOPHAGOGASTRODUODENOSCOPY (EGD) WITH PROPOFOL;  Surgeon: Lucilla Lame, MD;  Location: [facility] ENDOSCOPY;  Service: Endoscopy;  Laterality: N/A;   ESOPHAGOGASTRODUODENOSCOPY (EGD) WITH PROPOFOL N/A 09/01/2019   Procedure: ESOPHAGOGASTRODUODENOSCOPY (EGD) WITH PROPOFOL;  Surgeon: Lin Landsman, MD;  Location: [place];  Service: Gastroenterology;  Laterality: N/A;   FEMORAL ARTERY STENT  10/2010   right sided (Dr. [place] Knack)   KNEE ARTHROSCOPY WITH MEDIAL MENISECTOMY Right 08/20/2017   Procedure: KNEE ARTHROSCOPY WITH MEDIAL  AND LATERAL MENISECTOMY;  Surgeon: Hessie Knows, MD;  Location: [facility] ORS;  Service: Orthopedics;  Laterality: Right;   POSTERIOR CERVICAL LAMINECTOMY N/A 03/08/2018   Procedure: POSTERIOR CERVICAL LAMINECTOMY-C7;  Surgeon: Deetta Perla, MD;  Location: [facility] ORS;  Service: Neurosurgery;  Laterality: N/A;   TONSILLECTOMY     TOTAL KNEE ARTHROPLASTY Right 04/14/2019   Procedure: RIGHT TOTAL KNEE ARTHROPLASTY;  Surgeon: Hessie Knows, MD;  Location: [facility] ORS;  Service: Orthopedics;  Laterality: Right;   TUBAL LIGATION     Patient Active Problem List   Diagnosis Date Noted   COPD exacerbation ([place]) 03/12/2022   PND (post-nasal drip) 07/24/2021   Chronic pain of inguinal region 07/24/2021   Thrush 04/19/2021   Skin lesion 04/19/2021   Vaginal itching 04/19/2021   Vitamin D deficiency 01/14/2021   Breast mass, right 01/14/2021   Annual physical exam 01/14/2021   Pruritus of both eyes 09/21/2020   Lipoma of right lower extremity 08/30/2020   Hypertension associated with diabetes ([place]) 07/24/2020   Abnormality of both breasts on screening mammogram 05/14/2020   Bilateral hip pain 04/19/2020   Insomnia 04/19/2020   Chronic low back pain 02/22/2020   Internal hemorrhoids 02/22/2020   Benign breast cyst in female, right 02/22/2020   OSA on CPAP 12/15/2019   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[place]) 12/02/2019    Sensorimotor neuropathy 12/02/2019   Irritable bowel syndrome with both constipation and diarrhea 10/13/2019   Mild intermittent asthma 10/13/2019   PUD (peptic ulcer disease)    Status post total knee replacement using cement, right 04/14/2019   Overactive bladder 03/10/2019   Mild persistent asthma 11/29/2018   DDD (degenerative disc disease), cervical 11/26/2018   Chronic constipation 11/26/2018   Fatty liver 11/26/2018   Adrenal adenoma, left 11/26/2018   Allergic rhinitis 08/26/2018   Bunion of right foot 08/26/2018   Fall 05/14/2018   Anxiety and depression 04/05/2018   Cervical radiculopathy 02/24/2018   Numbness and tingling in right hand 02/24/2018  Chronic neck pain 12/18/2017   Type 2 diabetes mellitus without complication, without long-term current use of insulin ([place]) 12/18/2017   Urinary incontinence 12/18/2017   Lumbar radiculopathy 12/18/2017   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   Abnormal feces    Benign neoplasm of ascending colon    Intractable vomiting with nausea    Acute esophagogastric ulcer    Gastritis without bleeding    Vasomotor flushing 11/24/2016   Morbid obesity ([place]) 10/29/2015   Back ache 06/19/2015   Colon polyp 06/19/2015   CCF (congestive cardiac failure) ([place]) 06/19/2015   Family history of colon cancer 06/19/2015   Orthostatic hypotension 04/30/2015   Hematochezia 01/11/2015   History of colonic polyps 01/11/2015   Atypical chest pain 01/20/2014   COPD with chronic bronchitis and emphysema ([place]) 01/03/2013   Right knee pain 12/20/2011   HTN (hypertension) 29/47/6546   Systolic CHF, chronic (HCC)    History of cervical cancer    Depression    GERD (gastroesophageal reflux disease)    Seasonal allergies    History of drug abuse ([place])    PVD (peripheral vascular disease) ([place]) 02/03/2011   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   Smoking history 12/16/2010   Hyperlipidemia 08/23/2010   Coronary artery disease involving native coronary artery  of native heart without angina pectoris 08/23/2010   CLAUDICATION 08/23/2010   CAROTID BRUIT 08/23/2010   SHORTNESS OF BREATH 08/23/2010    PCP: [name], [name], MD   REFERRING PROVIDER: [name], [name], MD   REFERRING DIAG:  N32.81,N81.9 (ICD-10-CM) - Overactive bladder due to prolapse of female genital organ   THERAPY DIAG:  Pelvic floor dysfunction  Muscle weakness (generalized)  Other lack of coordination  Rationale for Evaluation and Treatment: Rehabilitation  ONSET DATE: A few years now  OCCUPATION/SOCIAL ACTIVITIES: Read, church, shopping, walking   PLOF: Independent  PERTINENT HISTORY/CHART REVIEW: CKD, CHF  Orland Mustard, MD (12/06/21) Plan Overactive bladder due to prolapse of female genital organ - Plan: Ambulatory referral to Physical Therapy, Vibegron (GEMTESA) 75 MG TABS, consider gyn or urogyn in the future if does not improve with PT   CHIEF CONCERN: Pt reports her kidneys are "so bad" and the urinary leakage has been happening for some years but this year has been the worst of it. Pt tries to perform kegels in order to hold her urine when she has the very strong urge to go to the bathroom (often). Pt is unable to make it to the bathroom without having to crunch over (forward trunk flexion) and squeeze her legs tight in order to not have leakage. However, most of the time the urinary leakage happens. Pt has to plan around where to go in the community and know where the bathroom is at all times. Pt has tried briefs and feels that her urine overflows and she has to constantly change her clothes. Pt has to change her clothes about 4-5x a day because she does not wear a brief. Pt also reports bedwetting occasionally because she cannot make it to the bathroom on time. Most recently, Pt has had bowel leakage with the urinary leakage as well. In some instances, she doesn't even notice the bowel leakage and notices when she wipes as her skin is  very irritated and red. In regards to the prolapse, when Pt wipes every time she does feel "something" there but Dr did not tell her about prolapse. Pt had a bedside commode and used for awhile but is currently not using. Pt is  going to her kidney doctor today and stopped taking the OAB medicine given because doctors are trying to figure out if the OAB medication is interfering with her kidneys/blood platelets.   LIVING ENVIRONMENT: Lives with: lives with their family Lives in: House/apartment   PATIENT GOALS: Pt be able to have control of her bladder, less leakage, not feeling a pressure at the vaginal opening. Pt would like to participate in sexual activity but fears about incontinence.     UROLOGICAL HISTORY Fluid intake: Yes: Water (tries a gallon a day per Dr.), fruit juice (pineapple, mango) , very occasionally a sprite Pain with urination: No Fully empty bladder: Yes Stream: Strong every time  Urgency: Yes:   Nocturia: 3-4x/night Frequency: "over 30x a day"  Leakage: Urge to void, Walking to the bathroom, Laughing, and Bending forward Pads: No because it hasn't helped and they cannot hold all the urine that comes out    GASTROINTESTINAL HISTORY  Pt has hx of constipation and is on medication ([place]) Pain with bowel movement: No Type of bowel movement ([place] Stool Chart): Type 5 and 7  Fully empty rectum: Yes:   Leakage: Yes: occasionally, but just recently, does not know if that is related to the medication because it is watery , Type 7 on underwear when she notices but not everyday (2x/week) , having to wear 2 underwears in case leakage happens especially when out in the community  Pads: No   SEXUAL HISTORY/FUNCTION Pain with intercourse: No Finger stimulation: no pain, but has leakage and cannot continue  Not engaging in intercourse currently due to incontinence   OBSTETRICAL HISTORY Vaginal deliveries: G4P2 Tearing: Yes: with first child    GYNECOLOGICAL  HISTORY Hysterectomy: yes, partial hysterectomy  Pelvic Organ Prolapse: see above in pertinent hx, not graded  Heaviness/pressure: sometimes    SUBJECTIVE:  Pt has been doing well with HEP. Pt has felt a significant decrease in urinary urgency.    PAIN:  Are you having pain? No NPRS scale: 0/10                                                                                                                                                                           OBJECTIVE:    COGNITION: Overall cognitive status: Within functional limits for tasks assessed     POSTURE:  Iliac crest height: L iliac crest  Pelvic obliquity: WNL   SENSATION:  Light touch:  L2-L4 intact, L5-S1 impaired, no dx of peripheral neuropathy in feet but has T2DM    LUMBAR AROM/PROM:   AROM (Normal range in degrees) AROM  02/06/22  Flexion (65) WNL  Extension (25-30) WNL  Right lateral flexion (25) WNL  Left lateral flexion (25) WNL*  Right rotation (30) WNL  Left rotation (30) WNL*   (*= pain, Blank rows = not tested)  LOWER EXTREMITY ROM:    PROM (Normal range in degrees) Right 02/06/22 Left 02/06/22  Hip flexion (0-125) WNL WNL  Hip extension (0-15)    Hip abduction (0-40) WNL WNL  Hip adduction    Hip internal rotation (0-45) WNL* WNL*  Hip external rotation (0-45) WNL* WNL*  Knee flexion WNL WNL   (*= pain, Blank rows = not tested)  LOWER EXTREMITY MMT:   MMT Right 02/06/22 Left 02/06/22  Hip flexion 4 3*  Hip extension    Hip abduction    Hip adduction    Hip internal rotation 4 4  Hip external rotation 4 4  Knee flexion 4 3  Knee extension 5 5  Ankle dorsiflexion (in seated) 5 5  Ankle plantarflexion    Ankle inversion    Ankle eversion    (*= pain, Blank rows = not tested)   HIP SPECIAL TESTS:  FADDIR: + B with groin pain  FABER: +B with groin pain    PALPATION: Abdominal:  Diastasis: none  LLQ Tenderness upon superficial palpation, pain increased closer to  ASIS  Iliopsoas tender and produced lower abdomen pain with radiation to the groin   RUQ- Scar from gallbladder removal, scar tissue restriction noted along scar   RLQ- Tenderness upon deep palpation, but not as severe as the L  Towards pubic symphysis some discomfort/pain   B adductor musculature tender upon palpation    EXTERNAL PELVIC EXAM: Patient educated on the purpose of the pelvic exam and articulated understanding; patient consented to the exam verbally.  Breath coordination: present but inconsistent  Voluntary Contraction: present, 2/5 MMT with Valsalva maneuver  Relaxation: delayed  Perineal movement with sustained IAP increase ("bear down"): no change, then felt descent after cueing Perineal movement with rapid IAP increase ("cough"): no change  (0= no contraction, 1= flicker, 2= weak squeeze, 3= fair squeeze with lift, 4= good squeeze and lift against resistance, 5= strong squeeze against strong resistance)   TODAY'S TREATMENT  Neuromuscular Re-education: Reassessment of FOTO for Progress Note and review of goals below  Pt has met 5/7 goals, discussion on significant progress from IE on 01/23/22 and Re-eval on 9/28 Urinary Problem - 58 (Goal: 50)  Bowel Leakage - 63 (Goal: 60) Bowel Constipation - 68 (Goal: 67)   Pallof press with coordinated breathing, GTB, for improved IAP management, VCs and TCs required    Patient response to interventions: Pt is very pleased with how much progress she has made thus far   Patient Education:  Patient provided with HEP: pallof press with GTB. Patient educated throughout session on appropriate technique and form using multi-modal cueing, HEP, and activity modification. Patient will continue to benefit from further education in order to maximize compliance and understanding for long-term therapeutic gains.    ASSESSMENT:  Clinical Impression: Patient presents to clinic with excellent motivation to participate in today's  progress note. Pt demonstrates significant improvement in relation to PFM coordination, PFM extensibility, pain, posture, and IAP management from re-eval on 04/24/22. Pt began to wear incontinence pads having to change now only 1x per day when out in the community (compared to 2-3x/day out in the community), no bowel leakage, significant decrease in urinary urgency especially at night now going up to 2x/night (Reeval- up to 4x), 2x instances of SUI over a 3 week period, improved posture in standing (equal iliac crest height and shoulder height),  and increased confidence in the bladder, 9/10 compared to 4/10 on re-eval. Pt's FOTO scores met the goal expected for all 3 outcome measures (Urinary Problem: 58, Bowel Leakage: 63, and Bowel Constipation: 68). See below for numbers from IE. Although, Pt has improved significantly, Pt would continue to benefit from skilled therapeutic interventions to address remaining deficits in relation to use of incontinence pads due to urinary leakage, urinary leakage occasionally with bending/coughing/prolonged activity, 4/5 MMT of B LEs primarily the R LE, and moderate constipation relying on laxatives to have consistent BMs. Pt required moderate VCs and TCs for proper technique and to decrease bodily compensations (shoulder elevation and knee hyperextension) with progression of TrA activation in standing. Pt will continue to benefit from continued PFPT in order address remaining deficits listed above in order to to increase PLOF and improve overall QOL.    Objective Impairments: decreased activity tolerance, decreased coordination, decreased endurance, decreased strength, increased muscle spasms, improper body mechanics, and pain.   Activity Limitations: lifting, bending, sleeping, bed mobility, continence, toileting, hygiene/grooming, and locomotion level  Personal Factors: Age, Behavior pattern, Past/current experiences, Time since onset of injury/illness/exacerbation, and 3+  comorbidities: CHF, CKD, brain tumor benign, T2DM, HTN  are also affecting patient's functional outcome.   Rehab Potential: Good  Clinical Decision Making: Evolving/moderate complexity  Evaluation Complexity: Moderate   GOALS: Goals reviewed with patient? Yes  SHORT TERM GOALS: Target date: 06/05/22  Patient will improve scores on FOTO Urinary Problem, Bowel Leakage, and Constipation by >/=7 points in order to demonstrate improved pelvic floor dysfunction with limited disruptions at home and in the community for an improved QOL.  Baseline: 37, 52, 60; (9/28): 44, 58, 68; (11/9): 58, 63, 68  Goal status: MET    LONG TERM GOALS: Target date: 07/03/2022  Patient will report confidence in ability to control bladder >/= 5/10 in order to demonstrate improved function and ability to participate more fully in activities at home and in the community. Baseline: 1/10; (9/28): 4/10; (11/9): 9/10 Goal status: MET  2.  Patient will report decreased instances of urinary leakage over a 3-week period in order to demonstrate improved bladder control and allow for increased participation in activities outside of the home. Baseline: urinary leakage every day (enough to make outer clothes wet)/changing 4-5x a day; (9/28): Pt is wearing incontinence pads in the community/2x per day; (11/9): still using incontinence pad in the community but only 1x/day  Goal status: IN PROGRESS  3.  Patient will report decreased instances of bowel leakage over a 3-week period in order to demonstrate improved bladder control and allow for increased participation in activities outside of the home. Baseline: (More than once a week)/up to 2x; (9/28): once a week; (11/9): no bowel leakage  Goal status: MET   4.  Patient will report being able to return to activities including, but not limited to: walking, church functions, sexual activity without limitation to indicate complete resolution of the chief complaint and return to  prior level of participation at home and in the community. Baseline: unable to attend a church service without bowel/bladder leakage/not participating in sexual activity due to leakage; (9/28): not participating in intercourse, still having urinary leakage in church but not as heavy as IE; (11/9): Very minimal leakage in the community using pad 1x/day Goal status: IN PROGRESS  5.  Patient will report less than 5 incidents of stress urinary incontinence over the course of 3 weeks while laughing/bending/prolonged activity in order to demonstrate improved PFM coordination, strength,  and function for improved overall QOL. Baseline: every time she laughs/bends over; (9/28): still having urinary leakage but not every time; (11/9): 2 instances over the past 3 weeks  Goal status: MET  6.  Patient will report decrease urinary leakage during the night in order to demonstrate improved PFM coordination, strength, improved sleep quality, and overall QOL.  Baseline: wakes up 3-4x/night and leaks every time; (9/28): 4x per night, not leaking every time walking to btahroom; (11/9): 2x Goal status: MET  PLAN: PT Frequency: 1x/week  PT Duration: 12 weeks  Planned Interventions: Therapeutic exercises, Therapeutic activity, Neuromuscular re-education, Balance training, Gait training, Patient/Family education, Joint mobilization, Spinal mobilization, Moist heat, scar mobilization, Taping, and Manual therapy  Plan For Next Session: continue deep core standing/quadruped/full dead bug, how is everything going? Continue or every 2 weeks now?   Hanaan Gancarz, PT, DPT  06/06/2022, 7:55 AM

## 2022-06-08 ENCOUNTER — Other Ambulatory Visit: Payer: Self-pay | Admitting: Family

## 2022-06-08 DIAGNOSIS — E785 Hyperlipidemia, unspecified: Secondary | ICD-10-CM

## 2022-06-11 NOTE — Telephone Encounter (Signed)
Patient has not returned call for initiation of Xolair and does not have follow up with pulmonology scheduled at this time. Will close encounter for now.

## 2022-06-12 ENCOUNTER — Ambulatory Visit: Payer: Medicare HMO

## 2022-06-18 ENCOUNTER — Ambulatory Visit: Payer: Medicare HMO

## 2022-06-22 ENCOUNTER — Other Ambulatory Visit: Payer: Self-pay | Admitting: Family

## 2022-06-22 ENCOUNTER — Other Ambulatory Visit: Payer: Self-pay | Admitting: Cardiovascular Disease

## 2022-06-22 DIAGNOSIS — K219 Gastro-esophageal reflux disease without esophagitis: Secondary | ICD-10-CM

## 2022-06-22 DIAGNOSIS — R609 Edema, unspecified: Secondary | ICD-10-CM

## 2022-06-24 ENCOUNTER — Ambulatory Visit (INDEPENDENT_AMBULATORY_CARE_PROVIDER_SITE_OTHER): Payer: Medicare HMO | Admitting: Family Medicine

## 2022-06-24 ENCOUNTER — Ambulatory Visit: Payer: Medicare HMO

## 2022-06-24 ENCOUNTER — Encounter: Payer: Self-pay | Admitting: Family Medicine

## 2022-06-24 VITALS — BP 118/70 | HR 50 | Temp 98.2°F | Ht 65.0 in | Wt 205.2 lb

## 2022-06-24 DIAGNOSIS — G629 Polyneuropathy, unspecified: Secondary | ICD-10-CM

## 2022-06-24 DIAGNOSIS — M6289 Other specified disorders of muscle: Secondary | ICD-10-CM

## 2022-06-24 DIAGNOSIS — M6281 Muscle weakness (generalized): Secondary | ICD-10-CM | POA: Diagnosis not present

## 2022-06-24 DIAGNOSIS — E785 Hyperlipidemia, unspecified: Secondary | ICD-10-CM | POA: Diagnosis not present

## 2022-06-24 DIAGNOSIS — D352 Benign neoplasm of pituitary gland: Secondary | ICD-10-CM

## 2022-06-24 DIAGNOSIS — Z23 Encounter for immunization: Secondary | ICD-10-CM | POA: Diagnosis not present

## 2022-06-24 DIAGNOSIS — I5022 Chronic systolic (congestive) heart failure: Secondary | ICD-10-CM

## 2022-06-24 DIAGNOSIS — F419 Anxiety disorder, unspecified: Secondary | ICD-10-CM

## 2022-06-24 DIAGNOSIS — F32A Depression, unspecified: Secondary | ICD-10-CM

## 2022-06-24 DIAGNOSIS — G4733 Obstructive sleep apnea (adult) (pediatric): Secondary | ICD-10-CM | POA: Diagnosis not present

## 2022-06-24 DIAGNOSIS — F39 Unspecified mood [affective] disorder: Secondary | ICD-10-CM

## 2022-06-24 DIAGNOSIS — R278 Other lack of coordination: Secondary | ICD-10-CM | POA: Diagnosis not present

## 2022-06-24 DIAGNOSIS — R42 Dizziness and giddiness: Secondary | ICD-10-CM

## 2022-06-24 DIAGNOSIS — R69 Illness, unspecified: Secondary | ICD-10-CM | POA: Diagnosis not present

## 2022-06-24 DIAGNOSIS — G4710 Hypersomnia, unspecified: Secondary | ICD-10-CM | POA: Diagnosis not present

## 2022-06-24 DIAGNOSIS — I259 Chronic ischemic heart disease, unspecified: Secondary | ICD-10-CM | POA: Diagnosis not present

## 2022-06-24 DIAGNOSIS — K5909 Other constipation: Secondary | ICD-10-CM

## 2022-06-24 MED ORDER — LINACLOTIDE 145 MCG PO CAPS: 145.0000 ug | ORAL_CAPSULE | Freq: Every day | ORAL | 1 refills | Status: DC

## 2022-06-24 MED ORDER — ACETAMINOPHEN ER 650 MG PO TBCR: 1300.0000 mg | EXTENDED_RELEASE_TABLET | Freq: Two times a day (BID) | ORAL | 1 refills | Status: AC | PRN

## 2022-06-24 MED ORDER — TIZANIDINE HCL 4 MG PO TABS: 4.0000 mg | ORAL_TABLET | Freq: Four times a day (QID) | ORAL | 1 refills | Status: DC | PRN

## 2022-06-24 MED ORDER — ROSUVASTATIN CALCIUM 20 MG PO TABS: 20.0000 mg | ORAL_TABLET | Freq: Every day | ORAL | 1 refills | Status: DC

## 2022-06-24 MED ORDER — TIZANIDINE HCL 4 MG PO TABS: 4.0000 mg | ORAL_TABLET | Freq: Three times a day (TID) | ORAL | 1 refills | Status: DC | PRN

## 2022-06-24 NOTE — Therapy (Signed)
OUTPATIENT PHYSICAL THERAPY FEMALE PELVIC TREATMENT   Patient Name: [name] MRN: [id number] DOB:14-Mar-1961, 61 y.o., female Today's Date: 06/24/2022   PT End of Session - 06/24/22 1307     Visit Number 11    Number of Visits 20    Date for PT Re-Evaluation 07/03/22    Authorization Type IE: 01/23/22, RC: 04/24/22    PT Start Time 1315    PT Stop Time 1355    PT Time Calculation (min) 40 min    Activity Tolerance Patient tolerated treatment well             Past Medical History:  Diagnosis Date   Allergy    Anemia    Arthritis    Asthma    Bacterial vaginitis    Blood in stool    Brachial neuritis or radiculitis NOS    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 being followed by neurosurgery/eye doctor and pcp. monitoring size.causes sinus problems   Bronchitis    Cardiac arrhythmia    Cervical neck pain with evidence of disc disease    C5/6 disease, MRI done late 2012 - no records available   Chronic combined systolic and diastolic CHF (congestive heart failure) ([place])    a. 08/2010 Echo: mildly reduced EF 40-45%, mild diffuse hypokinesis; b. 06/2016 Echo: EF 50%, no rwma, mild to mod TR; c. 03/2017 Echo: EF 40-45%, Gr1 DD (prior echo reviewed and EF felt to be lower than reported).   Chronic pain    Cocaine abuse, in remission (HCC)    clean x 24 years   Colon polyps    COPD (chronic obstructive pulmonary disease) ([place])    a. 12/2018 PFT: No obvious obst/restrictive dzs.   Coronary artery disease    a. PCI of LCX 2003; b. PCI of the LAD 2012 with a (2.5 x 8 mm BMS);  c.s/p CABG 4/12: L-LAD, VG-Dx, VG-OM, VG-RCA (Dr. Prescott Gum);  d. 01/2012 MV: inf infarct, attenuation, no ischemia; e. 02/2017 MV: signifi attenuation artifact. Fixed basal antlat/inflat scar vs artifact. Reversible apical lat and mid antlat defect - ? atten vs ischemia. F/u echo w/o wma->Med rx.   Depression    Diabetes mellitus without complication ([place])    Family history of colon  cancer    Generalized headaches    frequent   GERD (gastroesophageal reflux disease)    Headache    Heart murmur    Hematochezia    Hepatitis    history of hepatitis b   History of cervical cancer    s/p cryotherapy   History of drug abuse ([place])    cocaine, marijuana, clean since 1989   History of hepatitis B    from eating undercooked liver   History of MI (myocardial infarction)    Hyperlipidemia    Hypertension    Ischemic cardiomyopathy    a. 03/2017 Echo: EF 40-45%, Gr1 DD.   Myocardial infarction ([place]) 2003, 2012   PAD (peripheral artery disease) ([place])    a. s/p Right SFA atherectomy and PTA 01/15/11; b. 07/2018 ABI: R 1.02, L 1.09.   Pituitary mass ([place])    a. 12/2018 MRI Brain: Stable pituitary mass w/ 63m area of necrosis. Mass abuts R cavernous sinus w/o definite invasion.   Polyp of colon    Seasonal allergies    Sleep apnea    uses cpap   Smoking history    quit 07/2010   Thyroid disease    Urinary incontinence  Past Surgical History:  Procedure Laterality Date   ABDOMINAL HYSTERECTOMY     2003   CHOLECYSTECTOMY  1986   COLONOSCOPY  2008   3 polyps   COLONOSCOPY WITH PROPOFOL N/A 02/06/2015   Procedure: COLONOSCOPY WITH PROPOFOL;  Surgeon: Lucilla Lame, MD;  Location: [facility] ENDOSCOPY;  Service: Endoscopy;  Laterality: N/A;   COLONOSCOPY WITH PROPOFOL N/A 01/27/2017   Procedure: COLONOSCOPY WITH PROPOFOL;  Surgeon: Lucilla Lame, MD;  Location: [facility] ENDOSCOPY;  Service: Endoscopy;  Laterality: N/A;   CORONARY ANGIOPLASTY     w/ stent placement x2   CORONARY ARTERY BYPASS GRAFT  2012   Dr [name]   CORONARY STENT PLACEMENT  2003   S/P MI   CORONARY STENT PLACEMENT  2007   [place]   ESOPHAGOGASTRODUODENOSCOPY (EGD) WITH PROPOFOL N/A 02/06/2015   Procedure: ESOPHAGOGASTRODUODENOSCOPY (EGD) WITH PROPOFOL;  Surgeon: Lucilla Lame, MD;  Location: [facility] ENDOSCOPY;  Service: Endoscopy;  Laterality: N/A;   ESOPHAGOGASTRODUODENOSCOPY (EGD) WITH PROPOFOL N/A 01/27/2017    Procedure: ESOPHAGOGASTRODUODENOSCOPY (EGD) WITH PROPOFOL;  Surgeon: Lucilla Lame, MD;  Location: [facility] ENDOSCOPY;  Service: Endoscopy;  Laterality: N/A;   ESOPHAGOGASTRODUODENOSCOPY (EGD) WITH PROPOFOL N/A 09/01/2019   Procedure: ESOPHAGOGASTRODUODENOSCOPY (EGD) WITH PROPOFOL;  Surgeon: Lin Landsman, MD;  Location: [facility];  Service: Gastroenterology;  Laterality: N/A;   FEMORAL ARTERY STENT  10/2010   right sided (Dr. Burt Knack)   KNEE ARTHROSCOPY WITH MEDIAL MENISECTOMY Right 08/20/2017   Procedure: KNEE ARTHROSCOPY WITH MEDIAL  AND LATERAL MENISECTOMY;  Surgeon: Hessie Knows, MD;  Location: [facility] ORS;  Service: Orthopedics;  Laterality: Right;   POSTERIOR CERVICAL LAMINECTOMY N/A 03/08/2018   Procedure: POSTERIOR CERVICAL LAMINECTOMY-C7;  Surgeon: Deetta Perla, MD;  Location: [facility] ORS;  Service: Neurosurgery;  Laterality: N/A;   TONSILLECTOMY     TOTAL KNEE ARTHROPLASTY Right 04/14/2019   Procedure: RIGHT TOTAL KNEE ARTHROPLASTY;  Surgeon: Hessie Knows, MD;  Location: [facility] ORS;  Service: Orthopedics;  Laterality: Right;   TUBAL LIGATION     Patient Active Problem List   Diagnosis Date Noted   COPD exacerbation ([place]) 03/12/2022   PND (post-nasal drip) 07/24/2021   Chronic pain of inguinal region 07/24/2021   Thrush 04/19/2021   Skin lesion 04/19/2021   Vaginal itching 04/19/2021   Vitamin D deficiency 01/14/2021   Breast mass, right 01/14/2021   Annual physical exam 01/14/2021   Pruritus of both eyes 09/21/2020   Lipoma of right lower extremity 08/30/2020   Hypertension associated with diabetes ([place]) 07/24/2020   Abnormality of both breasts on screening mammogram 05/14/2020   Bilateral hip pain 04/19/2020   Insomnia 04/19/2020   Chronic low back pain 02/22/2020   Internal hemorrhoids 02/22/2020   Benign breast cyst in female, right 02/22/2020   OSA on CPAP 12/15/2019   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[place]) 12/02/2019   Sensorimotor neuropathy 12/02/2019   Irritable bowel syndrome with  both constipation and diarrhea 10/13/2019   Mild intermittent asthma 10/13/2019   PUD (peptic ulcer disease)    Status post total knee replacement using cement, right 04/14/2019   Overactive bladder 03/10/2019   Mild persistent asthma 11/29/2018   DDD (degenerative disc disease), cervical 11/26/2018   Chronic constipation 11/26/2018   Fatty liver 11/26/2018   Adrenal adenoma, left 11/26/2018   Allergic rhinitis 08/26/2018   Bunion of right foot 08/26/2018   Fall 05/14/2018   Anxiety and depression 04/05/2018   Cervical radiculopathy 02/24/2018   Numbness and tingling in right hand 02/24/2018   Chronic neck pain 12/18/2017   Type 2  diabetes mellitus without complication, without long-term current use of insulin ([place]) 12/18/2017   Urinary incontinence 12/18/2017   Lumbar radiculopathy 12/18/2017   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   Abnormal feces    Benign neoplasm of ascending colon    Intractable vomiting with nausea    Acute esophagogastric ulcer    Gastritis without bleeding    Vasomotor flushing 11/24/2016   Morbid obesity ([place]) 10/29/2015   Back ache 06/19/2015   Colon polyp 06/19/2015   CCF (congestive cardiac failure) ([place]) 06/19/2015   Family history of colon cancer 06/19/2015   Orthostatic hypotension 04/30/2015   Hematochezia 01/11/2015   History of colonic polyps 01/11/2015   Atypical chest pain 01/20/2014   COPD with chronic bronchitis and emphysema ([place]) 01/03/2013   Right knee pain 12/20/2011   HTN (hypertension) 76/28/3151   Systolic CHF, chronic (HCC)    History of cervical cancer    Depression    GERD (gastroesophageal reflux disease)    Seasonal allergies    History of drug abuse ([place])    PVD (peripheral vascular disease) ([place]) 02/03/2011   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   Smoking history 12/16/2010   Hyperlipidemia 08/23/2010   Coronary artery disease involving native coronary artery of native heart without angina pectoris 08/23/2010   CLAUDICATION  08/23/2010   CAROTID BRUIT 08/23/2010   SHORTNESS OF BREATH 08/23/2010    PCP: [name], [name], MD   REFERRING PROVIDER: [name], [name], MD   REFERRING DIAG:  N32.81,N81.9 (ICD-10-CM) - Overactive bladder due to prolapse of female genital organ   THERAPY DIAG:  Pelvic floor dysfunction  Muscle weakness (generalized)  Other lack of coordination  Rationale for Evaluation and Treatment: Rehabilitation  ONSET DATE: A few years now  OCCUPATION/SOCIAL ACTIVITIES: Read, church, shopping, walking   PLOF: Independent  PERTINENT HISTORY/CHART REVIEW: CKD, CHF  Orland Mustard, MD (12/06/21) Plan Overactive bladder due to prolapse of female genital organ - Plan: Ambulatory referral to Physical Therapy, Vibegron (GEMTESA) 75 MG TABS, consider gyn or urogyn in the future if does not improve with PT   CHIEF CONCERN: Pt reports her kidneys are "so bad" and the urinary leakage has been happening for some years but this year has been the worst of it. Pt tries to perform kegels in order to hold her urine when she has the very strong urge to go to the bathroom (often). Pt is unable to make it to the bathroom without having to crunch over (forward trunk flexion) and squeeze her legs tight in order to not have leakage. However, most of the time the urinary leakage happens. Pt has to plan around where to go in the community and know where the bathroom is at all times. Pt has tried briefs and feels that her urine overflows and she has to constantly change her clothes. Pt has to change her clothes about 4-5x a day because she does not wear a brief. Pt also reports bedwetting occasionally because she cannot make it to the bathroom on time. Most recently, Pt has had bowel leakage with the urinary leakage as well. In some instances, she doesn't even notice the bowel leakage and notices when she wipes as her skin is very irritated and red. In regards to the prolapse, when Pt wipes  every time she does feel "something" there but Dr did not tell her about prolapse. Pt had a bedside commode and used for awhile but is currently not using. Pt is going to her kidney doctor today and stopped  taking the OAB medicine given because doctors are trying to figure out if the OAB medication is interfering with her kidneys/blood platelets.   LIVING ENVIRONMENT: Lives with: lives with their family Lives in: House/apartment   PATIENT GOALS: Pt be able to have control of her bladder, less leakage, not feeling a pressure at the vaginal opening. Pt would like to participate in sexual activity but fears about incontinence.     UROLOGICAL HISTORY Fluid intake: Yes: Water (tries a gallon a day per Dr.), fruit juice (pineapple, mango) , very occasionally a sprite Pain with urination: No Fully empty bladder: Yes Stream: Strong every time  Urgency: Yes:   Nocturia: 3-4x/night Frequency: "over 30x a day"  Leakage: Urge to void, Walking to the bathroom, Laughing, and Bending forward Pads: No because it hasn't helped and they cannot hold all the urine that comes out    GASTROINTESTINAL HISTORY  Pt has hx of constipation and is on medication (Linzess) Pain with bowel movement: No Type of bowel movement ([place] Stool Chart): Type 5 and 7  Fully empty rectum: Yes:   Leakage: Yes: occasionally, but just recently, does not know if that is related to the medication because it is watery , Type 7 on underwear when she notices but not everyday (2x/week) , having to wear 2 underwears in case leakage happens especially when out in the community  Pads: No   SEXUAL HISTORY/FUNCTION Pain with intercourse: No Finger stimulation: no pain, but has leakage and cannot continue  Not engaging in intercourse currently due to incontinence   OBSTETRICAL HISTORY Vaginal deliveries: G4P2 Tearing: Yes: with first child    GYNECOLOGICAL HISTORY Hysterectomy: yes, partial hysterectomy  Pelvic Organ  Prolapse: see above in pertinent hx, not graded  Heaviness/pressure: sometimes    SUBJECTIVE:  Pt has been having pain in the lower abdomen due to being constipated. Pt has not had a BM since last Friday. Pt was given Linzess today and saw new PCP today. Pt is now seeing Carollee Leitz and told to take 135m of Linzess.    PAIN:  Are you having pain? Yes NPRS scale: 8/10                                                                                                                                                                           OBJECTIVE:    COGNITION: Overall cognitive status: Within functional limits for tasks assessed     POSTURE:  Iliac crest height: L iliac crest  Pelvic obliquity: WNL   SENSATION:  Light touch:  L2-L4 intact, L5-S1 impaired, no dx of peripheral neuropathy in feet but has T2DM    LUMBAR AROM/PROM:   AROM (Normal range in degrees) AROM  02/06/22  Flexion (65) WNL  Extension (25-30) WNL  Right lateral flexion (25) WNL  Left lateral flexion (25) WNL*  Right rotation (30) WNL  Left rotation (30) WNL*   (*= pain, Blank rows = not tested)  LOWER EXTREMITY ROM:    PROM (Normal range in degrees) Right 02/06/22 Left 02/06/22  Hip flexion (0-125) WNL WNL  Hip extension (0-15)    Hip abduction (0-40) WNL WNL  Hip adduction    Hip internal rotation (0-45) WNL* WNL*  Hip external rotation (0-45) WNL* WNL*  Knee flexion WNL WNL   (*= pain, Blank rows = not tested)  LOWER EXTREMITY MMT:   MMT Right 02/06/22 Left 02/06/22  Hip flexion 4 3*  Hip extension    Hip abduction    Hip adduction    Hip internal rotation 4 4  Hip external rotation 4 4  Knee flexion 4 3  Knee extension 5 5  Ankle dorsiflexion (in seated) 5 5  Ankle plantarflexion    Ankle inversion    Ankle eversion    (*= pain, Blank rows = not tested)   HIP SPECIAL TESTS:  FADDIR: + B with groin pain  FABER: +B with groin pain    PALPATION: Abdominal:  Diastasis:  none  LLQ Tenderness upon superficial palpation, pain increased closer to ASIS  Iliopsoas tender and produced lower abdomen pain with radiation to the groin   RUQ- Scar from gallbladder removal, scar tissue restriction noted along scar   RLQ- Tenderness upon deep palpation, but not as severe as the L  Towards pubic symphysis some discomfort/pain   B adductor musculature tender upon palpation    EXTERNAL PELVIC EXAM: Patient educated on the purpose of the pelvic exam and articulated understanding; patient consented to the exam verbally.  Breath coordination: present but inconsistent  Voluntary Contraction: present, 2/5 MMT with Valsalva maneuver  Relaxation: delayed  Perineal movement with sustained IAP increase ("bear down"): no change, then felt descent after cueing Perineal movement with rapid IAP increase ("cough"): no change  (0= no contraction, 1= flicker, 2= weak squeeze, 3= fair squeeze with lift, 4= good squeeze and lift against resistance, 5= strong squeeze against strong resistance)   TODAY'S TREATMENT  Manual Therapy: Abdominal myofascial release for improved fascial sling mobility and pain modulation   Bowel massage technique (ILU then from R to L) for improved motility and pain modulation  Significant tension upon palpation noted with pain  Improved with continued time   Neuromuscular Re-education: Quadruped diaphragmatic breathing with VCs and TCs for downregulation of the nervous system and improved management of IAP  Cat/cow for PFM lengthening and pain modulation as well as tissue extensibility, VCs and TCs required    Patient response to interventions: Pt ended session with 3/10 abdominal pain and comfortable to progress to every other week   Patient Education:  Patient provided with HEP: bowel massage technique handout. Patient educated throughout session on appropriate technique and form using multi-modal cueing, HEP, and activity modification. Patient  will continue to benefit from further education in order to maximize compliance and understanding for long-term therapeutic gains.    ASSESSMENT:  Clinical Impression: Patient presents to clinic with excellent motivation to participate in today's session. Pt continues to demonstrate deficits in IAP management, PFM coordination, LE strength, and PFM strength. Pt with increased lower abdominal pain, 8/10, as she has not had a BM in 4 days. Pt saw PCP this morning and given Linzess. Pt responded well to manual technique and  upon palpation demonstrated significant tension. With continued manual intervention, tension released and pain significantly decreased to a 3/10. Pt required moderate VCs and TCs for proper technique during pain modulation/PFM lengthening techniques to decrease bodily compensations. Pt will continue to benefit from skilled therapeutic intervention to address deficits in PFM coordination, PFM strength, IAP management, and LE strength in order to increase PLOF and improve overall QOL.    Objective Impairments: decreased activity tolerance, decreased coordination, decreased endurance, decreased strength, increased muscle spasms, improper body mechanics, and pain.   Activity Limitations: lifting, bending, sleeping, bed mobility, continence, toileting, hygiene/grooming, and locomotion level  Personal Factors: Age, Behavior pattern, Past/current experiences, Time since onset of injury/illness/exacerbation, and 3+ comorbidities: CHF, CKD, brain tumor benign, T2DM, HTN  are also affecting patient's functional outcome.   Rehab Potential: Good  Clinical Decision Making: Evolving/moderate complexity  Evaluation Complexity: Moderate   GOALS: Goals reviewed with patient? Yes  SHORT TERM GOALS: Target date: 06/05/22  Patient will improve scores on FOTO Urinary Problem, Bowel Leakage, and Constipation by >/=7 points in order to demonstrate improved pelvic floor dysfunction with limited  disruptions at home and in the community for an improved QOL.  Baseline: 37, 52, 60; (9/28): 44, 58, 68; (11/9): 58, 63, 68  Goal status: MET    LONG TERM GOALS: Target date: 07/03/2022  Patient will report confidence in ability to control bladder >/= 5/10 in order to demonstrate improved function and ability to participate more fully in activities at home and in the community. Baseline: 1/10; (9/28): 4/10; (11/9): 9/10 Goal status: MET  2.  Patient will report decreased instances of urinary leakage over a 3-week period in order to demonstrate improved bladder control and allow for increased participation in activities outside of the home. Baseline: urinary leakage every day (enough to make outer clothes wet)/changing 4-5x a day; (9/28): Pt is wearing incontinence pads in the community/2x per day; (11/9): still using incontinence pad in the community but only 1x/day  Goal status: IN PROGRESS  3.  Patient will report decreased instances of bowel leakage over a 3-week period in order to demonstrate improved bladder control and allow for increased participation in activities outside of the home. Baseline: (More than once a week)/up to 2x; (9/28): once a week; (11/9): no bowel leakage  Goal status: MET   4.  Patient will report being able to return to activities including, but not limited to: walking, church functions, sexual activity without limitation to indicate complete resolution of the chief complaint and return to prior level of participation at home and in the community. Baseline: unable to attend a church service without bowel/bladder leakage/not participating in sexual activity due to leakage; (9/28): not participating in intercourse, still having urinary leakage in church but not as heavy as IE; (11/9): Very minimal leakage in the community using pad 1x/day Goal status: IN PROGRESS  5.  Patient will report less than 5 incidents of stress urinary incontinence over the course of 3 weeks  while laughing/bending/prolonged activity in order to demonstrate improved PFM coordination, strength, and function for improved overall QOL. Baseline: every time she laughs/bends over; (9/28): still having urinary leakage but not every time; (11/9): 2 instances over the past 3 weeks  Goal status: MET  6.  Patient will report decrease urinary leakage during the night in order to demonstrate improved PFM coordination, strength, improved sleep quality, and overall QOL.  Baseline: wakes up 3-4x/night and leaks every time; (9/28): 4x per night, not leaking every time walking to  btahroom; (11/9): 2x Goal status: MET  PLAN: PT Frequency: 1x/week  PT Duration: 12 weeks  Planned Interventions: Therapeutic exercises, Therapeutic activity, Neuromuscular re-education, Balance training, Gait training, Patient/Family education, Joint mobilization, Spinal mobilization, Moist heat, scar mobilization, Taping, and Manual therapy  Plan For Next Session: continue deep core standing/quadruped/full dead bug   Shakendra Griffeth, PT, DPT  06/24/2022, 1:08 PM

## 2022-06-24 NOTE — Progress Notes (Signed)
SUBJECTIVE:   Chief Complaint  Patient presents with   Transitions Of Care    Knot on lft foot/ no BM in a few days   HPI Patient presents to clinic to transfer care.  Chronic constipation. Reports no BM x 5 days.  Has feeling of abdominal bloating and pain.  Denies any fevers, vomiting, diarrhea or weight loss.  Endorses not able to eat much because she feels so "backed up".  Diet consists mostly of high carbohydrate content and fried foods.  Has had similar symptoms in the past.  Tried MiraLAX previously but did not help.  Reports was started on Linzess at 145 mg, now decreased to 72 mg.  Endorses that since decreasing medication has not been moving bowels regularly and would like an increased back to 145 mg.   PERTINENT PMH / PSH: DM type II Hypertension CAD Heart failure with reduced ejection fraction Hyperlipidemia COPD OSA on CPAP GERD/PUD Irritable bowel syndrome both constipation and diarrhea Polypharmacy  OBJECTIVE:  BP 118/70 (BP Location: Left Arm, Patient Position: Sitting, Cuff Size: Normal)   Pulse (!) 50   Temp 98.2 F (36.8 C) (Oral)   Ht '5\' 5"'$  (1.651 m)   Wt 205 lb 3.2 oz (93.1 kg)   SpO2 99%   BMI 34.15 kg/m    Physical Exam Vitals reviewed.  Constitutional:      General: She is not in acute distress.    Appearance: She is not ill-appearing.  HENT:     Head: Normocephalic.     Nose: Nose normal.  Eyes:     Conjunctiva/sclera: Conjunctivae normal.  Cardiovascular:     Rate and Rhythm: Normal rate and regular rhythm.     Pulses: Normal pulses.     Heart sounds: Normal heart sounds.  Pulmonary:     Effort: Pulmonary effort is normal.     Breath sounds: Normal breath sounds.  Abdominal:     General: Abdomen is flat. Bowel sounds are normal. There is distension (mild). There is no abdominal bruit.     Palpations: Abdomen is soft. There is no fluid wave, mass or pulsatile mass.     Tenderness: There is generalized abdominal tenderness. There is  no right CVA tenderness, left CVA tenderness, guarding or rebound.  Musculoskeletal:        General: Normal range of motion.     Cervical back: Normal range of motion.  Skin:    General: Skin is warm and dry.  Neurological:     Mental Status: She is alert and oriented to person, place, and time. Mental status is at baseline.  Psychiatric:        Mood and Affect: Mood normal.        Behavior: Behavior normal.        Thought Content: Thought content normal.        Judgment: Judgment normal.        06/24/2022   11:04 AM 12/25/2021    1:15 PM 12/06/2021   11:23 AM 11/22/2021    9:51 AM 08/06/2021   11:29 AM  Depression screen PHQ 2/9  Decreased Interest 2 1 0 0 0  Down, Depressed, Hopeless 3 0 1 0 0  PHQ - 2 Score '5 1 1 '$ 0 0  Altered sleeping 3      Tired, decreased energy 2      Change in appetite 2      Feeling bad or failure about yourself  0      Trouble  concentrating 1      Moving slowly or fidgety/restless 0      Suicidal thoughts 0      PHQ-9 Score 13            06/24/2022   11:05 AM 02/21/2020    2:13 PM  GAD 7 : Generalized Anxiety Score  Nervous, Anxious, on Edge 2 3  Control/stop worrying 3 1  Worry too much - different things 3 2  Trouble relaxing 2 1  Restless 0 0  Easily annoyed or irritable 2 2  Afraid - awful might happen  0  Total GAD 7 Score  9  Anxiety Difficulty Very difficult Somewhat difficult     ASSESSMENT/PLAN:  Chronic constipation Assessment & Plan: Chronic.  Recent EGD from 2/21 notable for small hiatal hernia otherwise normal EGD.  Most recent colonoscopy from 2018 significant for removal of 3 polyps, 2 in the sigmoid colon and one in the ascending colon that were all resected.  Was also notable for diverticulosis in the sigmoid colon.  History of IBS.  Not taking Bentyl.  No acute abdomen on exam.  Hemodynamically stable.  Considered SBO but less likely given patient had similar symptoms in past that resolved with higher dose of Linzess.   Also considered gastroparesis given history of diabetes.  Currently taking multiple medications with constipation side effect.   Increase Linzess to 145 mg daily Increase soluble fiber, fruits and vegetables Limit fried food Adjusted constipating medications Follow-up in 1 week. If worsening symptoms plan to consult GI. Strict return precautions provided  Orders: -     linaCLOtide; Take 1 capsule (145 mcg total) by mouth daily before breakfast.  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  Stable. Annual eye exams Recommended MRI imaging in 2 years unless worsening vision. Follow-up with neurosurgery as scheduled.   Morbid obesity ([place]) Assessment & Plan: Continues to eat high carbohydrate diet. Recommended decreasing carbohydrates Increase soluble fiber Increase water intake Encouraged healthy lifestyle and increased exercise   Systolic CHF, chronic (HCC) Assessment & Plan: Chronic.  Stable. Prescribed Lasix 20 mg daily and 20 mg at lunch 3 times weekly.  Patient reports taking 20 mg twice daily. Prescribed Imdur 30 mg twice daily.  Patient reports taking 30 mg daily Prescribed potassium chloride 10 mEq 1 tablet daily and 1 tablet with furosemide at lunch 3 times weekly.  Patient reports taking 10 mg 3 times a day. Metoprolol 50 mg daily Farxiga 10 mg daily Xarelto 2.5 mg twice daily Follow-up with cardiology as scheduled.   Have asked patient to bring in medications at next visit for review.     Mood disorder [facility]) Assessment & Plan: History anxiety and depression.  PHQ-9 13 without SI or HI.  GAD 12.  Has been taking hydroxyzine without relief.  Not currently on SSRI.  Discussed discontinuing hydroxyzine if not effective.  Patient agreeable to discontinuing medication. Decrease Atarax Continue Paxil 20 mg daily, consider switching to citalopram given less anticholinergic effects Recommend CBT Follow-up in 1  week    Sensorimotor neuropathy -     Acetaminophen ER; Take 2 tablets (1,300 mg total) by mouth 2 (two) times daily as needed for pain.  Dispense: 60 tablet; Refill: 1 -     tiZANidine HCl; Take 1 tablet (4 mg total) by mouth every 8 (eight) hours as needed for muscle spasms.  Dispense: 30 tablet; Refill: 1 -     Pregabalin; Take 1 capsule (75  mg total) by mouth daily.  Dispense: 90 capsule; Refill: 1  Need for pneumococcal 20-valent conjugate vaccination -     Pneumococcal conjugate vaccine 20-valent  Hyperlipidemia, unspecified hyperlipidemia type -     Rosuvastatin Calcium; Take 1 tablet (20 mg total) by mouth at bedtime.  Dispense: 90 tablet; Refill: 1  Anxiety and depression -     hydrOXYzine HCl; Take 1 tablet (25 mg total) by mouth daily as needed for anxiety.  Dispense: 60 tablet; Refill: 1  Vertigo Assessment & Plan: Currently on meclizine 25 to 50 mg daily.  Reports had history of dizziness but was found to have low blood pressure.  She continues to take meclizine for fear of dizziness.  Has not had trial off medication. Discussed with patient limiting use of meclizine for episodes of vertigo.  Patient agreeable to weaning medication to off Recommend that she stop medication and if vertigo resumes and to restart meclizine at 12.5 mg daily as needed.   Orders: -     Meclizine HCl; TAKE 1 TABLET BY MOUTH TWICE DAILY AS NEEDED FOR DIZZINESS    PDMP reviewed  Return in about 1 week (around 07/01/2022) for PCP for mood.  Carollee Leitz, MD

## 2022-06-24 NOTE — Patient Instructions (Addendum)
It was a pleasure meeting you today. Thank you for allowing me to take part in your health care.  Our goals for today as we discussed include:  For your constipation Increase Linzess to 145 mg daily Increase soluble fiber, fruits vegetables and water intake Increase physical activity   For your muscle spasms Stop cyclobenzaprine Start tizanidine 4 mg.  Can take this medication up to 3 times a day.  Would like to switch to a different medication called baclofen in the future which has less constipation effect.  Stop Tylenol PM, the diphenhydramine can cause constipation. Start Tylenol 650 mg 3 times a day Continue Lyrica 75 mg daily   Decrease meclizine to 12.5 mg twice daily as needed for dizziness.  If you are not having any dizziness recommend to stop the medication as your dizziness was previously caused by low blood pressure.  If your dizziness returns then you can restart this medication.  If your hydroxyzine is not benefiting you recommend decreasing dose and using this only as needed. Decrease Atarax to 12.5 mg twice daily as needed for anxiety. Continue Paxil 20 mg daily.  Can discuss at next visit if wanting to switch medication.    Please bring in all of your medications at your next visit for review.  You have received your pneumonia 20 vaccine today  If you have any questions or concerns, please do not hesitate to call the office at [phone].  I look forward to our next visit and until then take care and stay safe.  Regards,   Carollee Leitz, MD   [facility]

## 2022-07-01 ENCOUNTER — Ambulatory Visit (INDEPENDENT_AMBULATORY_CARE_PROVIDER_SITE_OTHER): Payer: Medicare HMO | Admitting: Family Medicine

## 2022-07-01 ENCOUNTER — Encounter: Payer: Self-pay | Admitting: Family Medicine

## 2022-07-01 VITALS — BP 110/70 | HR 70 | Temp 98.2°F | Ht 65.0 in | Wt 205.0 lb

## 2022-07-01 DIAGNOSIS — Z1211 Encounter for screening for malignant neoplasm of colon: Secondary | ICD-10-CM | POA: Diagnosis not present

## 2022-07-01 DIAGNOSIS — K5909 Other constipation: Secondary | ICD-10-CM | POA: Diagnosis not present

## 2022-07-01 DIAGNOSIS — R1084 Generalized abdominal pain: Secondary | ICD-10-CM | POA: Diagnosis not present

## 2022-07-01 DIAGNOSIS — R300 Dysuria: Secondary | ICD-10-CM

## 2022-07-01 LAB — POCT URINALYSIS DIPSTICK
Bilirubin, UA: NEGATIVE
Blood, UA: NEGATIVE
Glucose, UA: POSITIVE — AB
Ketones, UA: NEGATIVE
Leukocytes, UA: NEGATIVE
Nitrite, UA: NEGATIVE
Protein, UA: NEGATIVE
Spec Grav, UA: 1.015 (ref 1.010–1.025)
Urobilinogen, UA: 0.2 E.U./dL
pH, UA: 6 (ref 5.0–8.0)

## 2022-07-01 NOTE — Progress Notes (Signed)
   SUBJECTIVE:   Chief Complaint  Patient presents with   Follow-up    1 week/ vomiting    HPI Patient presents to clinic for 1 week follow-up abdominal bloating, constipation.  She reports that she has not been eating, lost weight since her last visit.  Has increased Linzess to 145 mg and reports vomiting up.  Denies any fevers, bloody stool, bloody emesis.  She reports having painful urination.  Denies any vaginal discharge, hematuria, urgency or frequency.  PERTINENT PMH / PSH: DM type II GERD Hepatic steatosis PUD Chronic constipation Pituitary macroadenoma Morbid obesity  OBJECTIVE:  BP 110/70   Pulse 70   Temp 98.2 F (36.8 C) (Oral)   Ht '5\' 5"'$  (1.651 m)   Wt 205 lb (93 kg)   SpO2 97%   BMI 34.11 kg/m    Physical Exam Vitals reviewed.  Constitutional:      General: She is not in acute distress.    Appearance: She is not ill-appearing.  Cardiovascular:     Rate and Rhythm: Normal rate.     Pulses: Normal pulses.  Pulmonary:     Effort: Pulmonary effort is normal.  Abdominal:     General: Bowel sounds are normal. There is distension. There is no abdominal bruit.     Palpations: Abdomen is soft. There is no shifting dullness, fluid wave, hepatomegaly, splenomegaly, mass or pulsatile mass.     Tenderness: There is generalized abdominal tenderness. There is no right CVA tenderness, left CVA tenderness, guarding or rebound. Negative signs include Murphy's sign and McBurney's sign.     Hernia: No hernia is present.  Skin:    Coloration: Skin is not jaundiced.  Neurological:     Mental Status: She is alert. Mental status is at baseline.  Psychiatric:        Mood and Affect: Mood normal.        Behavior: Behavior normal.        Thought Content: Thought content normal.        Judgment: Judgment normal.     ASSESSMENT/PLAN:  Dysuria Assessment & Plan: Urine negative for infection Increase fluids, cranberry juice   Orders: -     POCT urinalysis  dipstick  Generalized abdominal pain Assessment & Plan: Generalized abdominal pain and now reports weight loss and no appetite.  Has stopped Linzess secondary to vomiting.  Weight remains stable from last visit. Generalized abdominal pain on exam. CT abdomen with contrast.  Rule out obstruction GI referral Need colonoscopy and possible gastric emptying studies if CT negative given history of diabetes T2 Strict return precautions provided  Orders: -     CT ABDOMEN PELVIS W CONTRAST; Future -     CBC with Differential/Platelet -     Hemoglobin A1c -     Lipase -     Comprehensive metabolic panel  Colon cancer screening -     Ambulatory referral to Gastroenterology  Chronic constipation Assessment & Plan: Suspect constipation playing a role in abdominal pain given generalized pain and history of IBS. Recommend continuing Linzess daily    PDMP reviewed  Return if symptoms worsen or fail to improve.  Carollee Leitz, MD

## 2022-07-01 NOTE — Patient Instructions (Addendum)
It was a pleasure meeting you today. Thank you for allowing me to take part in your health care.  Our goals for today as we discussed include:  Will get some imaging of your abdomen.  They will call you with an appointment  Will get some blood work today  Your urine was negative for infection  Your colonoscopy is due. Referral sent to Dr Dorothey Baseman office.  They will call you with an appointment  Recommend Shingles vaccine.  This is a 2 dose series and can be given at your local pharmacy.  Please talk to your pharmacist about this.   You are due for annual foot exam.  Please schedule appointment   If you have any questions or concerns, please do not hesitate to call the office at [phone].  I look forward to our next visit and until then take care and stay safe.  Regards,   Carollee Leitz, MD   [facility]

## 2022-07-02 ENCOUNTER — Other Ambulatory Visit: Payer: Self-pay

## 2022-07-02 ENCOUNTER — Telehealth: Payer: Self-pay

## 2022-07-02 DIAGNOSIS — Z86010 Personal history of colonic polyps: Secondary | ICD-10-CM

## 2022-07-02 DIAGNOSIS — Z800 Family history of malignant neoplasm of digestive organs: Secondary | ICD-10-CM

## 2022-07-02 LAB — CBC WITH DIFFERENTIAL/PLATELET
Basophils Absolute: 0.1 10*3/uL (ref 0.0–0.1)
Basophils Relative: 1.1 % (ref 0.0–3.0)
Eosinophils Absolute: 0.1 10*3/uL (ref 0.0–0.7)
Eosinophils Relative: 2.3 % (ref 0.0–5.0)
HCT: 43.6 % (ref 36.0–46.0)
Hemoglobin: 14.5 g/dL (ref 12.0–15.0)
Lymphocytes Relative: 54.4 % — ABNORMAL HIGH (ref 12.0–46.0)
Lymphs Abs: 3.5 10*3/uL (ref 0.7–4.0)
MCHC: 33.3 g/dL (ref 30.0–36.0)
MCV: 94.6 fl (ref 78.0–100.0)
Monocytes Absolute: 0.5 10*3/uL (ref 0.1–1.0)
Monocytes Relative: 7.1 % (ref 3.0–12.0)
Neutro Abs: 2.2 10*3/uL (ref 1.4–7.7)
Neutrophils Relative %: 35.1 % — ABNORMAL LOW (ref 43.0–77.0)
Platelets: 177 10*3/uL (ref 150.0–400.0)
RBC: 4.61 Mil/uL (ref 3.87–5.11)
RDW: 14 % (ref 11.5–15.5)
WBC: 6.3 10*3/uL (ref 4.0–10.5)

## 2022-07-02 LAB — COMPREHENSIVE METABOLIC PANEL
ALT: 24 U/L (ref 0–35)
AST: 21 U/L (ref 0–37)
Albumin: 4.3 g/dL (ref 3.5–5.2)
Alkaline Phosphatase: 90 U/L (ref 39–117)
BUN: 15 mg/dL (ref 6–23)
CO2: 23 mEq/L (ref 19–32)
Calcium: 9.5 mg/dL (ref 8.4–10.5)
Chloride: 107 mEq/L (ref 96–112)
Creatinine, Ser: 1.28 mg/dL — ABNORMAL HIGH (ref 0.40–1.20)
GFR: 44.47 mL/min — ABNORMAL LOW (ref >60.00)
Glucose, Bld: 88 mg/dL (ref 70–99)
Potassium: 3.7 mEq/L (ref 3.5–5.1)
Sodium: 139 mEq/L (ref 135–145)
Total Bilirubin: 0.2 mg/dL (ref 0.2–1.2)
Total Protein: 7.5 g/dL (ref 6.0–8.3)

## 2022-07-02 LAB — HEMOGLOBIN A1C: Hgb A1c MFr Bld: 6.7 % — ABNORMAL HIGH (ref 4.6–6.5)

## 2022-07-02 LAB — LIPASE: Lipase: 224 U/L — ABNORMAL HIGH (ref 11.0–59.0)

## 2022-07-02 MED ORDER — NA SULFATE-K SULFATE-MG SULF 17.5-3.13-1.6 GM/177ML PO SOLN: 1.0000 | ORAL | 0 refills | Status: AC

## 2022-07-02 NOTE — Telephone Encounter (Signed)
Gastroenterology Pre-Procedure Review  Request Date: 07/15/22 Requesting Physician: Dr. Marius Ditch  PATIENT REVIEW QUESTIONS: The patient responded to the following health history questions as indicated:    1. Are you having any GI issues? yes (stomach swelling not sure if its related to hernia.  Experiences really bad stomach pain after taking 4 bites of a meal, poor appetite has been ongoing for about a year and the pain is constant sharp and dull, nausea severe constipation only 1 bm a week if that much.  Has been taking Linzess but has not helped a great deal) Upper Endoscopy Dr. Marius Ditch 09/01/2019, Colonoscopy w/EGD 01/27/2017 with Dr. Allen Norris  2. Do you have a personal history of Polyps? yes (noted on last colonsocopy 01/27/2017) 3. Do you have a family history of Colon Cancer or Polyps? yes (father colon cancer) 4. Diabetes Mellitus? yes (type 2 diabetes takes [place]) 5. Joint replacements in the past 12 months?no 6. Major health problems in the past 3 months?Rotator Cuff Tear Right Arm problematic currently occurred 10/04/21 7. Any artificial heart valves, MVP, or defibrillator?yes (Patient has 2 stints, Quadruple bypass surgery about 6-7 years ago CAD,.  Pts cardiologist is Dr. [name] )    [place]:    Patient reports the following regarding taking any anticoagulation/antiplatelet therapy:   Plavix, Coumadin, Eliquis, Xarelto, Lovenox, Pradaxa, Brilinta, or Effient? yes (Xarelto prescribed by Dr. [name] Pt takes this BID ) Aspirin? yes (81 mg aspirin)  Patient confirms/reports the following medications:  Current Outpatient Medications  Medication Sig Dispense Refill   acetaminophen (TYLENOL 8 HOUR) 650 MG CR tablet Take 2 tablets (1,300 mg total) by mouth 2 (two) times daily as needed for pain. 60 tablet 1   albuterol (VENTOLIN HFA) 108 (90 Base) MCG/ACT inhaler Inhale 2 puffs into the lungs every 6 (six) hours as needed for wheezing or shortness of breath. 1 g 12   aspirin EC  81 MG tablet Take 1 tablet (81 mg total) by mouth daily. 90 tablet 3   Azelastine HCl 0.15 % SOLN Place 2 sprays into both nostrils daily as needed (allergies). 30 mL 11   clobetasol cream (TEMOVATE) 0.16 % Apply 1 application. topically 2 (two) times daily. Prn left elbow 30 g 0   Cyanocobalamin (VITAMIN B-12) 1000 MCG SUBL Place 0.001 tablets (1 mcg total) under the tongue daily. 90 tablet 3   dapagliflozin propanediol (FARXIGA) 10 MG TABS tablet Take 1 tablet (10 mg total) by mouth daily before breakfast. 90 tablet 3   dicyclomine (BENTYL) 10 MG capsule Take 1 capsule (10 mg total) by mouth 3 (three) times daily before meals. As needed for abdominal pain 120 capsule 11   EPINEPHrine 0.3 mg/0.3 mL IJ SOAJ injection Inject one pen into the thigh as needed for anaphylactic reaction. Then CALL 911. Use second pen if needed thereafter 2 each 5   esomeprazole (NEXIUM) 40 MG capsule Take 1 capsule by mouth once daily 90 capsule 0   Evolocumab (REPATHA SURECLICK) 010 MG/ML SOAJ INJECT '140MG'$  SUBCUTANEOUSLY EVERY 14 DAYS 2 mL 2   ezetimibe (ZETIA) 10 MG tablet Take 1 tablet (10 mg total) by mouth daily. 90 tablet 3   fluticasone (FLONASE) 50 MCG/ACT nasal spray Place 2 sprays into both nostrils daily. Prn nasal congestion 16 g 11   Fluticasone-Umeclidin-Vilant (TRELEGY ELLIPTA) 100-62.5-25 MCG/ACT AEPB Inhale 1 puff into the lungs daily. Rinse mouth 1 each 11   furosemide (LASIX) 20 MG tablet TAKE 1 TABLET BY MOUTH ONCE DAILY AND 1 TABLET  AT LUNCH THREE TIMES WEEKLY 126 tablet 0   hydrOXYzine (ATARAX) 25 MG tablet TAKE 1 TO 2 TABLETS BY MOUTH ONCE DAILY AS NEEDED 60 tablet 11   ipratropium (ATROVENT) 0.06 % nasal spray Place 2 sprays into both nostrils 4 (four) times daily. 15 mL 12   ipratropium-albuterol (DUONEB) 0.5-2.5 (3) MG/3ML SOLN USE 1 VIAL IN NEBULIZER TWICE DAILY AS NEEDED FOR WHEEZING OR SHORTNESS OF BREATH 360 mL 3   isosorbide mononitrate (IMDUR) 30 MG 24 hr tablet Take 1 tablet by mouth twice  daily 180 tablet 2   levocetirizine (XYZAL) 5 MG tablet Take 1 tablet (5 mg total) by mouth at bedtime as needed for allergies. 90 tablet 3   linaclotide (LINZESS) 145 MCG CAPS capsule Take 1 capsule (145 mcg total) by mouth daily before breakfast. 30 capsule 1   meclizine (ANTIVERT) 25 MG tablet TAKE 1 TABLET BY MOUTH TWICE DAILY AS NEEDED FOR DIZZINESS 60 tablet 11   metoprolol succinate (TOPROL-XL) 50 MG 24 hr tablet TAKE 1 TABLET BY MOUTH ONCE DAILY WITH OR IMMEDIATELY FOLLOWING A MEAL 90 tablet 0   montelukast (SINGULAIR) 10 MG tablet Take 1 tablet (10 mg total) by mouth at bedtime. 90 tablet 3   nitroGLYCERIN (NITROSTAT) 0.4 MG SL tablet DISSOLVE ONE TABLET UNDER THE TONGUE EVERY 5 MINUTES AS NEEDED FOR CHEST PAIN.  DO NOT EXCEED A TOTAL OF 3 DOSES IN 15 MINUTES 25 tablet 2   nystatin (MYCOSTATIN) 100000 UNIT/ML suspension Take 5 mLs (500,000 Units total) by mouth 4 (four) times daily. (Patient taking differently: Take 5 mLs by mouth as needed.) 240 mL 0   olopatadine (PATANOL) 0.1 % ophthalmic solution Place 1 drop into both eyes 2 (two) times daily. 15 mL 11   OneTouch Delica Lancets 23F MISC USE TO CHECK GLUCOSE IN THE MORNING AND AT BEDTIME 200 each 0   ONETOUCH VERIO test strip USE 1 STRIP TO CHECK GLUCOSE TWICE DAILY ONCE  IN  THE  MORNING  AND  ONCE  AT  BEDTIME 200 each 0   PARoxetine (PAXIL) 20 MG tablet Take 1 tablet (20 mg total) by mouth daily. 90 tablet 3   potassium chloride (KLOR-CON M) 10 MEQ tablet Take 1 tablet (10 mEq total) by mouth daily. TAKE 1 TABLET ONCE DAILY AND 1 TABLET WITH FUROSEMIDE AT LUNCH THREE TIMES A WEEK 90 tablet 3   pregabalin (LYRICA) 75 MG capsule Take 1 capsule by mouth twice daily 60 capsule 5   rivaroxaban (XARELTO) 2.5 MG TABS tablet Take 1 tablet by mouth twice daily 180 tablet 1   rosuvastatin (CRESTOR) 20 MG tablet Take 1 tablet (20 mg total) by mouth at bedtime. 90 tablet 1   sodium chloride (OCEAN) 0.65 % SOLN nasal spray Place 2 sprays into both  nostrils daily as needed for congestion. 30 mL 11   tiZANidine (ZANAFLEX) 4 MG tablet Take 1 tablet (4 mg total) by mouth every 8 (eight) hours as needed for muscle spasms. 30 tablet 1   No current facility-administered medications for this visit.    Patient confirms/reports the following allergies:  Allergies  Allergen Reactions   Latex Rash        Shellfish Allergy Anaphylaxis    Hard shellfish/swelling in throat   Sulfa Antibiotics Anaphylaxis    Swelling in throat.   Sulfonamide Derivatives Anaphylaxis    Swelling in throat.   Watermelon [Citrullus Vulgaris] Other (See Comments)    Throat itchy   Penicillins Itching   Soy  Allergy     Diarrhea/upset stomach     Tomato Itching    Throat itchy - also Raw carrots   Other Swelling    No orders of the defined types were placed in this encounter.   AUTHORIZATION INFORMATION Primary Insurance: 1D#: Group #:  Secondary Insurance: 1D#: Group #:  SCHEDULE INFORMATION: Date:  Time: Location:

## 2022-07-02 NOTE — Telephone Encounter (Signed)
I can see her on Thursday next week when I am on consults.  She will probably need gastric emptying study to evaluate for diabetic gastroparesis.  Schedule surveillance colonoscopy for history of colon polyps  RV

## 2022-07-03 ENCOUNTER — Telehealth: Payer: Self-pay | Admitting: Cardiovascular Disease

## 2022-07-03 NOTE — Telephone Encounter (Signed)
   Patient Name: [name]  DOB: May 19, 1961 MRN: [id number]  Primary Cardiologist: Ida Rogue, MD  Clinical pharmacists have reviewed the patient's past medical history, labs, and current medications as part of preoperative protocol coverage. The following recommendations have been made:  Patient with diagnosis of CAD/PAD on low dose Xarelto.   Procedure: colonoscopy Date of procedure: 07/15/22   CrCl 67m/min using adjusted body weight Platelet count 177K   Per office protocol, patient can hold Xarelto for 2 days prior to procedure.  Please resume Xarelto as soon as possible postprocedure, at the discretion of the surgeon.   I will route this recommendation to the requesting party via Epic fax function and remove from pre-op pool.  Please call with questions.  ELenna Sciara NP 07/03/2022, 12:21 PM

## 2022-07-03 NOTE — Telephone Encounter (Signed)
Made appointment for 07/10/2022

## 2022-07-03 NOTE — Telephone Encounter (Signed)
   Pre-operative Risk Assessment    Patient Name: [name]  DOB: 10/01/1960 MRN: [id number]      Request for Surgical Clearance    Procedure:  Colonoscopy  Date of Surgery:  Clearance 07/15/22                                 Surgeon:  not listed Surgeon's Group or Practice Name:  [facility] Phone number:  [phone] Fax number:  [phone]   Type of Clearance Requested:  Pharmacy, Xarelto    Type of Anesthesia:  General    Additional requests/questions:    Signed, Maxwell Caul   07/03/2022, 8:17 AM

## 2022-07-03 NOTE — Telephone Encounter (Signed)
Patient with diagnosis of CAD/PAD on low dose Xarelto.  Procedure: colonoscopy Date of procedure: 07/15/22  CrCl 6m/min using adjusted body weight Platelet count 177K  Per office protocol, patient can hold Xarelto for 2 days prior to procedure.    **This guidance is not considered finalized until pre-operative APP has relayed final recommendations.**

## 2022-07-04 ENCOUNTER — Other Ambulatory Visit: Payer: Self-pay

## 2022-07-04 ENCOUNTER — Telehealth: Payer: Self-pay

## 2022-07-04 DIAGNOSIS — K219 Gastro-esophageal reflux disease without esophagitis: Secondary | ICD-10-CM

## 2022-07-04 MED ORDER — ESOMEPRAZOLE MAGNESIUM 40 MG PO CPDR: 40.0000 mg | DELAYED_RELEASE_CAPSULE | Freq: Every day | ORAL | 0 refills | Status: DC

## 2022-07-04 NOTE — Telephone Encounter (Signed)
Patient states she needs a refill for her esomeprazole (NEXIUM) 40 MG capsule.  Patient states she has one pill left.  [facility] on [address] is her preferred pharmacy.

## 2022-07-04 NOTE — Telephone Encounter (Signed)
Refill sent to pharmacy.  [name],cma

## 2022-07-04 NOTE — Telephone Encounter (Signed)
Per Dr. [name] Office note dated 07/04/22 patient has been advised:  "Per office protocol, patient can hold Xarelto for 2 days prior to procedure.  Please resume Xarelto as soon as possible postprocedure, at the discretion of the surgeon".  Patient verbalized understanding.  Thanks,  [place], [place]

## 2022-07-06 ENCOUNTER — Encounter: Payer: Self-pay | Admitting: Family Medicine

## 2022-07-06 DIAGNOSIS — Z23 Encounter for immunization: Secondary | ICD-10-CM | POA: Insufficient documentation

## 2022-07-06 DIAGNOSIS — R42 Dizziness and giddiness: Secondary | ICD-10-CM | POA: Insufficient documentation

## 2022-07-06 DIAGNOSIS — F39 Unspecified mood [affective] disorder: Secondary | ICD-10-CM | POA: Insufficient documentation

## 2022-07-06 HISTORY — DX: Encounter for immunization: Z23

## 2022-07-06 MED ORDER — MECLIZINE HCL 12.5 MG PO TABS: ORAL_TABLET | ORAL | Status: DC

## 2022-07-06 MED ORDER — HYDROXYZINE HCL 25 MG PO TABS: 25.0000 mg | ORAL_TABLET | Freq: Every day | ORAL | 1 refills | Status: DC | PRN

## 2022-07-06 MED ORDER — PREGABALIN 75 MG PO CAPS: 75.0000 mg | ORAL_CAPSULE | Freq: Every day | ORAL | 1 refills | Status: DC

## 2022-07-06 NOTE — Assessment & Plan Note (Addendum)
Chronic.  Stable. Prescribed Lasix 20 mg daily and 20 mg at lunch 3 times weekly.  Patient reports taking 20 mg twice daily. Prescribed Imdur 30 mg twice daily.  Patient reports taking 30 mg daily Prescribed potassium chloride 10 mEq 1 tablet daily and 1 tablet with furosemide at lunch 3 times weekly.  Patient reports taking 10 mg 3 times a day. Metoprolol 50 mg daily Farxiga 10 mg daily Xarelto 2.5 mg twice daily Follow-up with cardiology as scheduled.   Have asked patient to bring in medications at next visit for review.

## 2022-07-06 NOTE — Assessment & Plan Note (Addendum)
History anxiety and depression.  PHQ-9 13 without SI or HI.  GAD 12.  Has been taking hydroxyzine without relief.  Not currently on SSRI.  Discussed discontinuing hydroxyzine if not effective.  Patient agreeable to discontinuing medication. Decrease Atarax Continue Paxil 20 mg daily, consider switching to citalopram given less anticholinergic effects Recommend CBT Follow-up in 1 week

## 2022-07-06 NOTE — Assessment & Plan Note (Addendum)
Chronic.  Recent EGD from 2/21 notable for small hiatal hernia otherwise normal EGD.  Most recent colonoscopy from 2018 significant for removal of 3 polyps, 2 in the sigmoid colon and one in the ascending colon that were all resected.  Was also notable for diverticulosis in the sigmoid colon.  History of IBS.  Not taking Bentyl.  No acute abdomen on exam.  Hemodynamically stable.  Considered SBO but less likely given patient had similar symptoms in past that resolved with higher dose of Linzess.  Also considered gastroparesis given history of diabetes.  Currently taking multiple medications with constipation side effect.   Increase Linzess to 145 mg daily Increase soluble fiber, fruits and vegetables Limit fried food Adjusted constipating medications Follow-up in 1 week. If worsening symptoms plan to consult GI. Strict return precautions provided

## 2022-07-06 NOTE — Assessment & Plan Note (Signed)
Currently on meclizine 25 to 50 mg daily.  Reports had history of dizziness but was found to have low blood pressure.  She continues to take meclizine for fear of dizziness.  Has not had trial off medication. Discussed with patient limiting use of meclizine for episodes of vertigo.  Patient agreeable to weaning medication to off Recommend that she stop medication and if vertigo resumes and to restart meclizine at 12.5 mg daily as needed.

## 2022-07-06 NOTE — Assessment & Plan Note (Addendum)
Chronic.  Stable. Annual eye exams Recommended MRI imaging in 2 years unless worsening vision. Follow-up with neurosurgery as scheduled.

## 2022-07-06 NOTE — Assessment & Plan Note (Signed)
Continues to eat high carbohydrate diet. Recommended decreasing carbohydrates Increase soluble fiber Increase water intake Encouraged healthy lifestyle and increased exercise

## 2022-07-08 ENCOUNTER — Ambulatory Visit: Payer: Medicare HMO | Attending: Internal Medicine

## 2022-07-08 DIAGNOSIS — M6289 Other specified disorders of muscle: Secondary | ICD-10-CM | POA: Insufficient documentation

## 2022-07-08 DIAGNOSIS — M6281 Muscle weakness (generalized): Secondary | ICD-10-CM | POA: Insufficient documentation

## 2022-07-08 DIAGNOSIS — R278 Other lack of coordination: Secondary | ICD-10-CM | POA: Insufficient documentation

## 2022-07-08 NOTE — Therapy (Signed)
OUTPATIENT PHYSICAL THERAPY FEMALE PELVIC DISCHARGE   Patient Name: [name] MRN: [id number] DOB:Nov 26, 1960, 61 y.o., female Today's Date: 07/08/2022   PT End of Session - 07/08/22 1045     Visit Number 12    Number of Visits 20    Date for PT Re-Evaluation 07/03/22    Authorization Type IE: 01/23/22, RC: 04/24/22    PT Start Time 1050    PT Stop Time 1120    PT Time Calculation (min) 30 min    Activity Tolerance Patient tolerated treatment well             Past Medical History:  Diagnosis Date   Abnormal feces    Abnormality of both breasts on screening mammogram 05/14/2020   Acute esophagogastric ulcer    Allergy    Anemia    Annual physical exam 01/14/2021   Anxiety and depression 04/05/2018   Arthritis    Asthma    Atypical chest pain 01/20/2014   Back ache 06/19/2015   Bacterial vaginitis    Benign breast cyst in female, right 02/22/2020   Bilateral hip pain 04/19/2020   Blood in stool    Brachial neuritis or radiculitis NOS    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 being followed by neurosurgery/eye doctor and pcp. monitoring size.causes sinus problems   Breast mass, right 01/14/2021   Bronchitis    Bunion of right foot 08/26/2018   Cardiac arrhythmia    CCF (congestive cardiac failure) ([place]) 06/19/2015   Cervical neck pain with evidence of disc disease    C5/6 disease, MRI done late 2012 - no records available   Chronic combined systolic and diastolic CHF (congestive heart failure) ([place])    a. 08/2010 Echo: mildly reduced EF 40-45%, mild diffuse hypokinesis; b. 06/2016 Echo: EF 50%, no rwma, mild to mod TR; c. 03/2017 Echo: EF 40-45%, Gr1 DD (prior echo reviewed and EF felt to be lower than reported).   Chronic neck pain 12/18/2017   Chronic pain    Chronic pain of inguinal region 07/24/2021   CLAUDICATION 08/23/2010   Qualifier: Diagnosis of   By: Tamala Julian RN, Megan       Cocaine abuse, in remission ([place])    clean x 24 years   Colon  polyp 06/19/2015   2010   Colon polyps    COPD (chronic obstructive pulmonary disease) ([place])    a. 12/2018 PFT: No obvious obst/restrictive dzs.   COPD exacerbation ([place]) 03/12/2022   Coronary artery disease    a. PCI of LCX 2003; b. PCI of the LAD 2012 with a (2.5 x 8 mm BMS);  c.s/p CABG 4/12: L-LAD, VG-Dx, VG-OM, VG-RCA (Dr. Prescott Gum);  d. 01/2012 MV: inf infarct, attenuation, no ischemia; e. 02/2017 MV: signifi attenuation artifact. Fixed basal antlat/inflat scar vs artifact. Reversible apical lat and mid antlat defect - ? atten vs ischemia. F/u echo w/o wma->Med rx.   Depression    Depression    Diabetes mellitus without complication ([place])    Fall 05/14/2018   Family history of colon cancer    Family history of colon cancer 06/19/2015   Gastritis without bleeding    Generalized headaches    frequent   GERD (gastroesophageal reflux disease)    Headache    Heart murmur    Hematochezia    Hematochezia 01/11/2015   Hepatitis    history of hepatitis b   History of cervical cancer    s/p cryotherapy   History of cervical  cancer    s/p cryotherapy   History of colonic polyps 01/11/2015   History of drug abuse ([place])    cocaine, marijuana, clean since 1989   History of drug abuse ([place])    cocaine, marijuana, clean since 1989   History of hepatitis B    from eating undercooked liver   History of MI (myocardial infarction)    HTN (hypertension) 12/20/2011   Hyperlipidemia    Hypertension    Insomnia 04/19/2020   Intractable vomiting with nausea    Intractable vomiting with nausea    Ischemic cardiomyopathy    a. 03/2017 Echo: EF 40-45%, Gr1 DD.   Lipoma of right lower extremity 08/30/2020   Myocardial infarction [facility]) 2003, 2012   Numbness and tingling in right hand 02/24/2018   PAD (peripheral artery disease) ([place])    a. s/p Right SFA atherectomy and PTA 01/15/11; b. 07/2018 ABI: R 1.02, L 1.09.   Pituitary mass ([place])    a. 12/2018 MRI Brain: Stable pituitary mass w/ 22m area  of necrosis. Mass abuts R cavernous sinus w/o definite invasion.   PND (post-nasal drip) 07/24/2021   Polyp of colon    Pruritus of both eyes 09/21/2020   Right knee pain 12/20/2011   Abnormal MRI right knee [facility]29/2016 [facility]      FINDINGS: Medial compartment: [There is abnormal linear signal within the medial meniscus near junction of posterior horn and mid body which abuts the tibial undersurface consistent with nondisplaced tear (5:8-10). The articular cartilage, and subchondral bone are normal. 3 mm bone island (7:14).  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   Seasonal allergies    Shortness of breath 08/23/2010   Qualifier: Diagnosis of   By: STamala JulianRN, Megan       Skin lesion 04/19/2021   Sleep apnea    uses cpap   Smoking history    quit 07/2010   Smoking history 12/16/2010   Quit 07/2010   Status post total knee replacement using cement, right 04/14/2019   Thrush 04/19/2021   Thyroid disease    Urinary incontinence    Urinary incontinence 12/18/2017   Vaginal itching 04/19/2021   Vasomotor flushing 11/24/2016   Past Surgical History:  Procedure Laterality Date   ABDOMINAL HYSTERECTOMY     2003   CHOLECYSTECTOMY  1986   COLONOSCOPY  2008   3 polyps   COLONOSCOPY WITH PROPOFOL N/A 02/06/2015   Procedure: COLONOSCOPY WITH PROPOFOL;  Surgeon: DLucilla Lame MD;  Location: [facility] ENDOSCOPY;  Service: Endoscopy;  Laterality: N/A;   COLONOSCOPY WITH PROPOFOL N/A 01/27/2017   Procedure: COLONOSCOPY WITH PROPOFOL;  Surgeon: WLucilla Lame MD;  [facility];  Service: Endoscopy;  Laterality: N/A;   CORONARY ANGIOPLASTY     w/ stent placement x2   CORONARY ARTERY BYPASS GRAFT  2012   Dr GRockey Situ  CORONARY STENT PLACEMENT  2003   S/P MI   CORONARY STENT PLACEMENT  2007   [place]   ESOPHAGOGASTRODUODENOSCOPY (EGD) WITH PROPOFOL N/A 02/06/2015   Procedure: ESOPHAGOGASTRODUODENOSCOPY (EGD) WITH PROPOFOL;  Surgeon: DLucilla Lame MD;   Location: [facility] ENDOSCOPY;  Service: Endoscopy;  Laterality: N/A;   ESOPHAGOGASTRODUODENOSCOPY (EGD) WITH PROPOFOL N/A 01/27/2017   Procedure: ESOPHAGOGASTRODUODENOSCOPY (EGD) WITH PROPOFOL;  Surgeon: WLucilla Lame MD;  Location: [facility] ENDOSCOPY;  Service: Endoscopy;  Laterality: N/A;   ESOPHAGOGASTRODUODENOSCOPY (EGD) WITH PROPOFOL N/A 09/01/2019   Procedure: ESOPHAGOGASTRODUODENOSCOPY (EGD) WITH PROPOFOL;  Surgeon: VLin Landsman MD;  [facility];  Service: Gastroenterology;  Laterality: N/A;   FEMORAL ARTERY STENT  10/2010   right sided (Dr. Burt Knack)   KNEE ARTHROSCOPY WITH MEDIAL MENISECTOMY Right 08/20/2017   Procedure: KNEE ARTHROSCOPY WITH MEDIAL  AND LATERAL MENISECTOMY;  Surgeon: Hessie Knows, MD;  Location: [facility] ORS;  Service: Orthopedics;  Laterality: Right;   POSTERIOR CERVICAL LAMINECTOMY N/A 03/08/2018   Procedure: POSTERIOR CERVICAL LAMINECTOMY-C7;  Surgeon: Deetta Perla, MD;  Location: [facility] ORS;  Service: Neurosurgery;  Laterality: N/A;   TONSILLECTOMY     TOTAL KNEE ARTHROPLASTY Right 04/14/2019   Procedure: RIGHT TOTAL KNEE ARTHROPLASTY;  Surgeon: Hessie Knows, MD;  Location: [facility] ORS;  Service: Orthopedics;  Laterality: Right;   TUBAL LIGATION     Patient Active Problem List   Diagnosis Date Noted   Mood disorder ([place]) 07/06/2022   Need for pneumococcal 20-valent conjugate vaccination 07/06/2022   Vertigo 07/06/2022   Vitamin D deficiency 01/14/2021   Hypertension associated with diabetes ([place]) 07/24/2020   Chronic low back pain 02/22/2020   Internal hemorrhoids 02/22/2020   OSA on CPAP 12/15/2019   Sensorimotor neuropathy 12/02/2019   Irritable bowel syndrome with both constipation and diarrhea 10/13/2019   Mild intermittent asthma 10/13/2019   PUD (peptic ulcer disease)    Overactive bladder 03/10/2019   Mild persistent asthma 11/29/2018   DDD (degenerative disc disease), cervical 11/26/2018   Chronic constipation 11/26/2018   Fatty liver 11/26/2018    Adrenal adenoma, left 11/26/2018   Allergic rhinitis 08/26/2018   Cervical radiculopathy 02/24/2018   Type 2 diabetes mellitus without complication, without long-term current use of insulin ([place]) 12/18/2017   Lumbar radiculopathy 12/18/2017   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   Benign neoplasm of ascending colon    Morbid obesity ([place]) 10/29/2015   Orthostatic hypotension 04/30/2015   COPD with chronic bronchitis and emphysema ([place]) 27/09/5007   Systolic CHF, chronic ([facility])    GERD (gastroesophageal reflux disease)    Seasonal allergies    PVD (peripheral vascular disease) ([place]) 02/03/2011   Hyperlipidemia 08/23/2010   Coronary artery disease involving native coronary artery of native heart without angina pectoris 08/23/2010    PCP: [name], [name], MD   REFERRING PROVIDER: [name], [name], MD   REFERRING DIAG:  N32.81,N81.9 (ICD-10-CM) - Overactive bladder due to prolapse of female genital organ   THERAPY DIAG:  Pelvic floor dysfunction  Muscle weakness (generalized)  Other lack of coordination  Rationale for Evaluation and Treatment: Rehabilitation  ONSET DATE: A few years now  OCCUPATION/SOCIAL ACTIVITIES: Read, church, shopping, walking   PLOF: Independent  PERTINENT HISTORY/CHART REVIEW: CKD, CHF  Orland Mustard, MD (12/06/21) Plan Overactive bladder due to prolapse of female genital organ - Plan: Ambulatory referral to Physical Therapy, Vibegron (GEMTESA) 75 MG TABS, consider gyn or urogyn in the future if does not improve with PT   CHIEF CONCERN: Pt reports her kidneys are "so bad" and the urinary leakage has been happening for some years but this year has been the worst of it. Pt tries to perform kegels in order to hold her urine when she has the very strong urge to go to the bathroom (often). Pt is unable to make it to the bathroom without having to crunch over (forward trunk flexion) and squeeze her legs tight in order to not  have leakage. However, most of the time the urinary leakage happens. Pt has to plan around where to go in the community and know where the bathroom is at all times. Pt has tried briefs  and feels that her urine overflows and she has to constantly change her clothes. Pt has to change her clothes about 4-5x a day because she does not wear a brief. Pt also reports bedwetting occasionally because she cannot make it to the bathroom on time. Most recently, Pt has had bowel leakage with the urinary leakage as well. In some instances, she doesn't even notice the bowel leakage and notices when she wipes as her skin is very irritated and red. In regards to the prolapse, when Pt wipes every time she does feel "something" there but Dr did not tell her about prolapse. Pt had a bedside commode and used for awhile but is currently not using. Pt is going to her kidney doctor today and stopped taking the OAB medicine given because doctors are trying to figure out if the OAB medication is interfering with her kidneys/blood platelets.   LIVING ENVIRONMENT: Lives with: lives with their family Lives in: House/apartment   PATIENT GOALS: Pt be able to have control of her bladder, less leakage, not feeling a pressure at the vaginal opening. Pt would like to participate in sexual activity but fears about incontinence.     UROLOGICAL HISTORY Fluid intake: Yes: Water (tries a gallon a day per Dr.), fruit juice (pineapple, mango) , very occasionally a sprite Pain with urination: No Fully empty bladder: Yes Stream: Strong every time  Urgency: Yes:   Nocturia: 3-4x/night Frequency: "over 30x a day"  Leakage: Urge to void, Walking to the bathroom, Laughing, and Bending forward Pads: No because it hasn't helped and they cannot hold all the urine that comes out    GASTROINTESTINAL HISTORY  Pt has hx of constipation and is on medication (Linzess) Pain with bowel movement: No Type of bowel movement ([place] Stool Chart): Type  5 and 7  Fully empty rectum: Yes:   Leakage: Yes: occasionally, but just recently, does not know if that is related to the medication because it is watery , Type 7 on underwear when she notices but not everyday (2x/week) , having to wear 2 underwears in case leakage happens especially when out in the community  Pads: No   SEXUAL HISTORY/FUNCTION Pain with intercourse: No Finger stimulation: no pain, but has leakage and cannot continue  Not engaging in intercourse currently due to incontinence   OBSTETRICAL HISTORY Vaginal deliveries: G4P2 Tearing: Yes: with first child    GYNECOLOGICAL HISTORY Hysterectomy: yes, partial hysterectomy  Pelvic Organ Prolapse: see above in pertinent hx, not graded  Heaviness/pressure: sometimes    SUBJECTIVE:  Pt did have a BM from the last session after the manual technique. However, her BMs have not been consistent. Pt went to see the doctor and based off of her last two CT scans, Pt was impacted. Pt is receiving a CT scan tomorrow because Dr wants to know if the hernia has grown. This does not sound MSK in nature.    PAIN:  Are you having pain? No NPRS scale: 8/10  OBJECTIVE:    COGNITION: Overall cognitive status: Within functional limits for tasks assessed     POSTURE:  Iliac crest height: L iliac crest  Pelvic obliquity: WNL   SENSATION:  Light touch:  L2-L4 intact, L5-S1 impaired, no dx of peripheral neuropathy in feet but has T2DM    LUMBAR AROM/PROM:   AROM (Normal range in degrees) AROM  02/06/22  Flexion (65) WNL  Extension (25-30) WNL  Right lateral flexion (25) WNL  Left lateral flexion (25) WNL*  Right rotation (30) WNL  Left rotation (30) WNL*   (*= pain, Blank rows = not tested)  LOWER EXTREMITY ROM:    PROM (Normal range in degrees)  Right 02/06/22 Left 02/06/22  Hip flexion (0-125) WNL WNL  Hip extension (0-15)    Hip abduction (0-40) WNL WNL  Hip adduction    Hip internal rotation (0-45) WNL* WNL*  Hip external rotation (0-45) WNL* WNL*  Knee flexion WNL WNL   (*= pain, Blank rows = not tested)  LOWER EXTREMITY MMT:   MMT Right 02/06/22 Left 02/06/22  Hip flexion 4 3*  Hip extension    Hip abduction    Hip adduction    Hip internal rotation 4 4  Hip external rotation 4 4  Knee flexion 4 3  Knee extension 5 5  Ankle dorsiflexion (in seated) 5 5  Ankle plantarflexion    Ankle inversion    Ankle eversion    (*= pain, Blank rows = not tested)   HIP SPECIAL TESTS:  FADDIR: + B with groin pain  FABER: +B with groin pain    PALPATION: Abdominal:  Diastasis: none  LLQ Tenderness upon superficial palpation, pain increased closer to ASIS  Iliopsoas tender and produced lower abdomen pain with radiation to the groin   RUQ- Scar from gallbladder removal, scar tissue restriction noted along scar   RLQ- Tenderness upon deep palpation, but not as severe as the L  Towards pubic symphysis some discomfort/pain   B adductor musculature tender upon palpation    EXTERNAL PELVIC EXAM: Patient educated on the purpose of the pelvic exam and articulated understanding; patient consented to the exam verbally.  Breath coordination: present but inconsistent  Voluntary Contraction: present, 2/5 MMT with Valsalva maneuver  Relaxation: delayed  Perineal movement with sustained IAP increase ("bear down"): no change, then felt descent after cueing Perineal movement with rapid IAP increase ("cough"): no change  (0= no contraction, 1= flicker, 2= weak squeeze, 3= fair squeeze with lift, 4= good squeeze and lift against resistance, 5= strong squeeze against strong resistance)   TODAY'S TREATMENT  Neuromuscular Re-education: FOTO reassessment for discharge: FOTO Urinary Problem - On 11/9: 58, Today: 63 FOTO Bowel  Leakage - On 11/9: 63, Today: 65 FOTO Bowel Constipation - On 11/0: 68, Today: 45 (due to GI dysfunction but receiving CT scan tomorrow)   Reviewed goals below Pt has met all goals below, 7/7    Patient response to interventions: Pt is content with significant progression during time in PFPT   Patient Education:  Patient provided with HEP: no change. Patient educated throughout session on appropriate technique and form using multi-modal cueing, HEP, and activity modification. Patient will continue to benefit from further education in order to maximize compliance and understanding for long-term therapeutic gains.    ASSESSMENT:  Clinical Impression: Patient presents to clinic with excellent motivation to participate in today's session. Pt continues to demonstrate deficits in IAP management, PFM coordination, LE strength, and PFM strength. Pt continues  to have some GI dysfunction in relation to constipation but DPT believes this is not MSK in nature. Pt has visited physician and is receiving at CT scan tomorrow to see if hernia has grown and is impacting GI tract. In relation to PFPT, Pt has demonstrated significant progression since IE on 01/23/22 and since last visit on 06/05/22. Pt reports no instances of SUI nor general urinary leakage in the home or in the community. Pt has been able to not wear incontinence pad in the community and return to multiple activities prior to being seen for PFM dysfunction. Pt continues not to have bowel leakage and reports full control (10/10) of her bladder. Given results on FOTO Urinary Problem/Bowel Leakage (see below) and subjective reporting, Pt agrees and is adequate for discharge from PFPT. Discussion on continuing HEP as needed and given rehab services information if needed in the future.    Objective Impairments: decreased activity tolerance, decreased coordination, decreased endurance, decreased strength, increased muscle spasms, improper body mechanics,  and pain.   Activity Limitations: lifting, bending, sleeping, bed mobility, continence, toileting, hygiene/grooming, and locomotion level  Personal Factors: Age, Behavior pattern, Past/current experiences, Time since onset of injury/illness/exacerbation, and 3+ comorbidities: CHF, CKD, brain tumor benign, T2DM, HTN  are also affecting patient's functional outcome.   Rehab Potential: Good  Clinical Decision Making: Evolving/moderate complexity  Evaluation Complexity: Moderate   GOALS: Goals reviewed with patient? Yes  SHORT TERM GOALS: Target date: 06/05/22  Patient will improve scores on FOTO Urinary Problem, Bowel Leakage, and Constipation by >/=7 points in order to demonstrate improved pelvic floor dysfunction with limited disruptions at home and in the community for an improved QOL.  Baseline: 37, 52, 60; (9/28): 44, 58, 68; (11/9): 58, 63, 68, (12/12): 63, 65, 45 Goal status: MET    LONG TERM GOALS: Target date: 07/03/2022  Patient will report confidence in ability to control bladder >/= 5/10 in order to demonstrate improved function and ability to participate more fully in activities at home and in the community. Baseline: 1/10; (9/28): 4/10; (11/9): 9/10 Goal status: MET  2.  Patient will report decreased instances of urinary leakage over a 3-week period in order to demonstrate improved bladder control and allow for increased participation in activities outside of the home. Baseline: urinary leakage every day (enough to make outer clothes wet)/changing 4-5x a day; (9/28): Pt is wearing incontinence pads in the community/2x per day; (11/9): still using incontinence pad in the community but only 1x/day; (12/12): Pt is not using incontinence pad in the community Goal status: MET  3.  Patient will report decreased instances of bowel leakage over a 3-week period in order to demonstrate improved bladder control and allow for increased participation in activities outside of the  home. Baseline: (More than once a week)/up to 2x; (9/28): once a week; (11/9): no bowel leakage  Goal status: MET   4.  Patient will report being able to return to activities including, but not limited to: walking, church functions, sexual activity without limitation to indicate complete resolution of the chief complaint and return to prior level of participation at home and in the community. Baseline: unable to attend a church service without bowel/bladder leakage/not participating in sexual activity due to leakage; (9/28): not participating in intercourse, still having urinary leakage in church but not as heavy as IE; (11/9): Very minimal leakage in the community using pad 1x/day; (12/12): no urinary leakage episodes  Goal status: MET  5.  Patient will report less than  5 incidents of stress urinary incontinence over the course of 3 weeks while laughing/bending/prolonged activity in order to demonstrate improved PFM coordination, strength, and function for improved overall QOL. Baseline: every time she laughs/bends over; (9/28): still having urinary leakage but not every time; (11/9): 2 instances over the past 3 weeks; (12/12): no SUI  Goal status: MET  6.  Patient will report decrease urinary leakage during the night in order to demonstrate improved PFM coordination, strength, improved sleep quality, and overall QOL.  Baseline: wakes up 3-4x/night and leaks every time; (9/28): 4x per night, not leaking every time walking to btahroom; (11/9): 2x, (12/12): 1x  Goal status: MET  PLAN: PT Frequency: 1x/week  PT Duration: 12 weeks  Planned Interventions: Therapeutic exercises, Therapeutic activity, Neuromuscular re-education, Balance training, Gait training, Patient/Family education, Joint mobilization, Spinal mobilization, Moist heat, scar mobilization, Taping, and Manual therapy     Presten Joost, PT, DPT  07/08/2022, 11:32 AM

## 2022-07-09 ENCOUNTER — Ambulatory Visit
Admission: RE | Admit: 2022-07-09 | Discharge: 2022-07-09 | Disposition: A | Discharge status: Home / Self Care | Payer: Medicare HMO | Source: Ambulatory Visit | Attending: Family Medicine | Admitting: Family Medicine

## 2022-07-09 DIAGNOSIS — R1012 Left upper quadrant pain: Secondary | ICD-10-CM | POA: Diagnosis not present

## 2022-07-09 DIAGNOSIS — I700 Atherosclerosis of aorta: Secondary | ICD-10-CM | POA: Diagnosis not present

## 2022-07-09 DIAGNOSIS — R1084 Generalized abdominal pain: Secondary | ICD-10-CM | POA: Diagnosis not present

## 2022-07-09 DIAGNOSIS — R1110 Vomiting, unspecified: Secondary | ICD-10-CM | POA: Diagnosis not present

## 2022-07-09 MED ORDER — IOHEXOL 300 MG/ML  SOLN
85.0000 mL | Freq: Once | INTRAMUSCULAR | Status: AC | PRN
  Administered 2022-07-09: 85 mL via INTRAVENOUS

## 2022-07-10 ENCOUNTER — Encounter: Payer: Self-pay | Admitting: Family Medicine

## 2022-07-10 ENCOUNTER — Encounter: Payer: Self-pay | Admitting: Gastroenterology

## 2022-07-10 ENCOUNTER — Ambulatory Visit (INDEPENDENT_AMBULATORY_CARE_PROVIDER_SITE_OTHER): Payer: Medicare HMO | Admitting: Gastroenterology

## 2022-07-10 VITALS — BP 107/70 | HR 60 | Ht 65.0 in | Wt 205.4 lb

## 2022-07-10 DIAGNOSIS — R1010 Upper abdominal pain, unspecified: Secondary | ICD-10-CM | POA: Diagnosis not present

## 2022-07-10 DIAGNOSIS — R1013 Epigastric pain: Secondary | ICD-10-CM | POA: Diagnosis not present

## 2022-07-10 DIAGNOSIS — K5909 Other constipation: Secondary | ICD-10-CM | POA: Diagnosis not present

## 2022-07-10 DIAGNOSIS — I700 Atherosclerosis of aorta: Secondary | ICD-10-CM | POA: Insufficient documentation

## 2022-07-10 MED ORDER — NA SULFATE-K SULFATE-MG SULF 17.5-3.13-1.6 GM/177ML PO SOLN: 1.0000 | Freq: Once | ORAL | 0 refills | Status: AC

## 2022-07-10 NOTE — Progress Notes (Signed)
Cephas Darby, MD [address]  [place]  [place], [place] [address]  Main: [phone]  Fax: [phone]    Gastroenterology Consultation  Referring Provider:     [name], Olivia Mackie * Primary Care Physician:  Carollee Leitz, MD Primary Gastroenterologist:  Dr. Cephas Darby Reason for Consultation: chronic constipation, abdominal bloating, early satiety        HPI:   [name] is a 61 y.o. female referred by Dr. Carollee Leitz, MD  for consultation & management of diffuse quadrant pain, chronic constipation.  Patient has history of chronic constipation almost all her life and fecal impaction at age of 32.  Patient saw Dr. Terese Door on 08/16/2019 secondary to severe left sided abdominal pain associated with nausea.  She was started on MiraLAX, Bentyl as well as empirically treated for acute sigmoid diverticulitis with 10 days course of Cipro and Flagyl.  Patient reports that she finished antibiotics however, pain persisted and she feels like something stuck in her left upper quadrant and has to come out, pain is predominantly in the left upper quadrant radiating to the back and lower abdomen.  She denies fever, chills.  She does feel nauseous, denies vomiting.  She denies abdominal bloating, rectal bleeding.  She reports her stools are watery, has tried enema and suppository this does not work. She does not smoke or drink alcohol Known history of sigmoid diverticulosis  Follow-up video visit 09/07/2019 I initially saw [name] on 08/25/2019 due to severe left lower quadrant pain, severe constipation and she was empirically being treated for acute sigmoid diverticulitis.  Repeat CT scan did not reveal diverticulitis.  Upper endoscopy was unremarkable as well.  I treated her constipation with GoLYTELY followed by Linzess 145 MCG and high-fiber diet with fiber supplements.  She reports that she is responding very well to Linzess and fiber supplements.  She is taking  Linzess every day and her bowel movements are now formed.  She reports that her left lower quadrant pain is gradually improving.  She is very pleased with the way her symptoms are improving.  She drinks sodas daily and eats red meat regularly.  Follow-up visit 11/15/2019 I started her on Linzess 145 MCG daily which worked very well initially, however she was having 4-5 loose bowel movements daily.  So, her PCP switched to Linzess 72 MCG daily.  She is currently having 2 bowel movements daily.  She does have mild left lower quadrant discomfort.  She has admitted drinking sodas.  Currently drinking more water.  She has been stressed out recently as her grandson passed away from AML at age 5.  She is currently going through grief response and worried about her daughter.  She does report intermittent epigastric discomfort and frequent belching.  She is currently on Nexium 40 mg daily.  Her weight has been stable  Follow-up visit 04/17/2020 Patient reports doing well overall.  Linzess 39 MCG is working well for her.  She reports mild left-sided discomfort.  She is trying to adopt healthy lifestyle.  Her sister who is on dialysis lives with her.  She does not have any other concerns today.  Follow-up visit 07/10/2022 Patient has not seen me for 2 years.  She was reached out by our office to schedule screening colonoscopy and patient reported upper GI symptoms including abdominal bloating, upper abdominal fullness after taking 4 bites of a meal, decreased appetite.  This has been ongoing for almost a year.  She also developed severe constipation.  She stopped taking Linzess 72 mcg on her own because she felt her bowel movements are fine on Linzess.  She reached out to her PCP who restarted 72 mcg which did not work.  This was about 2 weeks ago.  Currently taking 145 mcg, started about a week ago.  She is now having too many bowel movements on higher dose and therefore stopped taking it in last 2 days.  Having  irregular bowel movements, about once a week only.  She is scheduled to undergo colonoscopy on 12/19.  She continues to take Nexium 40 mg once a day before breakfast.  She underwent CT abdomen pelvis yesterday, report is pending  She does not smoke or drink alcohol   NSAIDs: None   Antiplts/Anticoagulants/Anti thrombotics: None   GI Procedures:  EGD 09/01/2019 - Normal duodenal bulb and second portion of the duodenum. - Small hiatal hernia. - Normal stomach. - Normal gastroesophageal junction and esophagus. - No specimens collected.   Antiplts/Anticoagulants/Anti thrombotics: None   EGD and colonoscopy in 2018 by Dr. Allen Norris - Small hiatal hernia. - Non-bleeding gastric ulcers with no stigmata of bleeding. Biopsied. - Gastritis. Biopsied. - Normal examined duodenum.   - Two 2 to 4 mm polyps in the sigmoid colon, removed with a cold snare. Resected and retrieved. - One 4 mm polyp in the ascending colon, removed with a cold snare. Resected and retrieved. - Diverticulosis in the sigmoid colon. - Non-bleeding internal hemorrhoids.   DIAGNOSIS:  A. STOMACH ULCERATION; COLD BIOPSY:  - MILD ACTIVE GASTRITIS WITH ULCERATION.  - NEGATIVE FOR DYSPLASIA AND MALIGNANCY.   ADDENDUM:  Immunohistochemical stain for H. pylori is negative with an appropriate  control.   B.  COLON POLYP, ASCENDING; COLD SNARE:  - TUBULAR ADENOMA.  - NEGATIVE FOR HIGH-GRADE DYSPLASIA AND MALIGNANCY.   C. COLON POLYP 2, SIGMOID; COLD SNARE:  - TUBULAR ADENOMAS (2).  - NEGATIVE FOR HIGH-GRADE DYSPLASIA AND MALIGNANCY.    EGD and colonoscopy in 2016 by Dr. Allen Norris   DIAGNOSIS:  A. COLON POLYP, ASCENDING; HOT SNARE:  - TUBULAR ADENOMA, 1.0 CM FRAGMENT AND SEVERAL SMALLER FRAGMENTS.  - NEGATIVE FOR HIGH-GRADE DYSPLASIA AND MALIGNANCY.  EGD and colonoscopy in 2018 by Dr. Allen Norris - Small hiatal hernia. - Non-bleeding gastric ulcers with no stigmata of bleeding. Biopsied. - Gastritis. Biopsied. - Normal examined  duodenum.  - Two 2 to 4 mm polyps in the sigmoid colon, removed with a cold snare. Resected and retrieved. - One 4 mm polyp in the ascending colon, removed with a cold snare. Resected and retrieved. - Diverticulosis in the sigmoid colon. - Non-bleeding internal hemorrhoids.  DIAGNOSIS:  A. STOMACH ULCERATION; COLD BIOPSY:  - MILD ACTIVE GASTRITIS WITH ULCERATION.  - NEGATIVE FOR DYSPLASIA AND MALIGNANCY.   ADDENDUM:  Immunohistochemical stain for H. pylori is negative with an appropriate  control.   B.  COLON POLYP, ASCENDING; COLD SNARE:  - TUBULAR ADENOMA.  - NEGATIVE FOR HIGH-GRADE DYSPLASIA AND MALIGNANCY.   C. COLON POLYP 2, SIGMOID; COLD SNARE:  - TUBULAR ADENOMAS (2).  - NEGATIVE FOR HIGH-GRADE DYSPLASIA AND MALIGNANCY.   EGD and colonoscopy in 2016 by Dr. Allen Norris  DIAGNOSIS:  A. COLON POLYP, ASCENDING; HOT SNARE:  - TUBULAR ADENOMA, 1.0 CM FRAGMENT AND SEVERAL SMALLER FRAGMENTS.  - NEGATIVE FOR HIGH-GRADE DYSPLASIA AND MALIGNANCY.   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 being followed by neurosurgery/eye doctor and pcp. monitoring size.causes sinus problems   Breast mass, right 01/14/2021   Bronchitis    Bunion of right foot 08/26/2018   Cardiac arrhythmia    CCF (congestive cardiac failure) ([place]) 06/19/2015   Cervical neck pain with evidence of disc disease    C5/6 disease, MRI done late 2012 - no records available   Chronic combined systolic and diastolic CHF  (congestive heart failure) ([place])    a. 08/2010 Echo: mildly reduced EF 40-45%, mild diffuse hypokinesis; b. 06/2016 Echo: EF 50%, no rwma, mild to mod TR; c. 03/2017 Echo: EF 40-45%, Gr1 DD (prior echo reviewed and EF felt to be lower than reported).   Chronic neck pain 12/18/2017   Chronic pain    Chronic pain of inguinal region 07/24/2021   CLAUDICATION 08/23/2010   Qualifier: Diagnosis of   By: Tamala Julian RN, Megan       Cocaine abuse, in remission ([place])    clean x 24 years   Colon polyp 06/19/2015   2010   Colon polyps    COPD (chronic obstructive pulmonary disease) ([place])    a. 12/2018 PFT: No obvious obst/restrictive dzs.   COPD exacerbation ([place]) 03/12/2022   Coronary artery disease    a. PCI of LCX 2003; b. PCI of the LAD 2012 with a (2.5 x 8 mm BMS);  c.s/p CABG 4/12: L-LAD, VG-Dx, VG-OM, VG-RCA (Dr. Prescott Gum);  d. 01/2012 MV: inf infarct, attenuation, no ischemia; e. 02/2017 MV: signifi attenuation artifact. Fixed basal antlat/inflat scar vs artifact. Reversible apical lat and mid antlat defect - ? atten vs ischemia. F/u echo w/o wma->Med rx.   Depression    Depression    Diabetes mellitus without complication ([place])    Fall 05/14/2018   Family history of colon cancer    Family history of colon cancer 06/19/2015   Gastritis without bleeding    Generalized headaches    frequent   GERD (gastroesophageal reflux disease)    Headache    Heart murmur    Hematochezia    Hematochezia 01/11/2015   Hepatitis    history of hepatitis b   History of cervical cancer    s/p cryotherapy   History of cervical cancer    s/p cryotherapy   History of colonic polyps 01/11/2015   History of drug abuse ([place])    cocaine, marijuana, clean since 1989   History of drug abuse ([place])    cocaine, marijuana, clean since 1989   History of hepatitis B    from eating undercooked liver   History of MI (myocardial infarction)    HTN (hypertension) 12/20/2011   Hyperlipidemia    Hypertension    Insomnia  04/19/2020   Intractable vomiting with nausea    Intractable vomiting with nausea    Ischemic cardiomyopathy    a. 03/2017 Echo: EF 40-45%, Gr1 DD.   Lipoma of right lower extremity 08/30/2020   Myocardial infarction [facility]) 2003, 2012   Numbness and tingling in right hand 02/24/2018  PAD (peripheral artery disease) ([place])    a. s/p Right SFA atherectomy and PTA 01/15/11; b. 07/2018 ABI: R 1.02, L 1.09.   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 12/2018 MRI Brain: Stable pituitary mass w/ 44m area of necrosis. Mass abuts R cavernous sinus w/o definite invasion.   PND (post-nasal drip) 07/24/2021   Polyp of colon    Pruritus of both eyes 09/21/2020   Right knee pain 12/20/2011   Abnormal MRI right knee 08/29/2016 f/u [facility]      FINDINGS: Medial compartment: [There is abnormal linear signal within the medial meniscus near junction of posterior horn and mid body which abuts the tibial undersurface consistent with nondisplaced tear (5:8-10). The articular cartilage, and subchondral bone are normal. 3 mm bone island (7:14).  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   Seasonal allergies    Shortness of breath 08/23/2010   Qualifier: Diagnosis of   By: STamala JulianRN, Megan       Skin lesion 04/19/2021   Sleep apnea    uses cpap   Smoking history    quit 07/2010   Smoking history 12/16/2010   Quit 07/2010   Status post total knee replacement using cement, right 04/14/2019   Thrush 04/19/2021   Thyroid disease    Urinary incontinence    Urinary incontinence 12/18/2017   Vaginal itching 04/19/2021   Vasomotor flushing 11/24/2016    Past Surgical History:  Procedure Laterality Date   ABDOMINAL HYSTERECTOMY     2003   CHOLECYSTECTOMY  1986   COLONOSCOPY  2008   3 polyps   COLONOSCOPY WITH PROPOFOL N/A 02/06/2015   Procedure: COLONOSCOPY WITH PROPOFOL;  Surgeon: DLucilla Lame MD;  Location: [facility] ENDOSCOPY;  Service: Endoscopy;  Laterality: N/A;   COLONOSCOPY WITH PROPOFOL N/A  01/27/2017   Procedure: COLONOSCOPY WITH PROPOFOL;  Surgeon: WLucilla Lame MD;  [facility];  Service: Endoscopy;  Laterality: N/A;   CORONARY ANGIOPLASTY     w/ stent placement x2   CORONARY ARTERY BYPASS GRAFT  2012   Dr GRockey Situ  CORONARY STENT PLACEMENT  2003   S/P MI   CORONARY STENT PLACEMENT  2007   [place]   ESOPHAGOGASTRODUODENOSCOPY (EGD) WITH PROPOFOL N/A 02/06/2015   Procedure: ESOPHAGOGASTRODUODENOSCOPY (EGD) WITH PROPOFOL;  Surgeon: DLucilla Lame MD;  Location: [facility] ENDOSCOPY;  Service: Endoscopy;  Laterality: N/A;   ESOPHAGOGASTRODUODENOSCOPY (EGD) WITH PROPOFOL N/A 01/27/2017   Procedure: ESOPHAGOGASTRODUODENOSCOPY (EGD) WITH PROPOFOL;  Surgeon: WLucilla Lame MD;  Location: [facility] ENDOSCOPY;  Service: Endoscopy;  Laterality: N/A;   ESOPHAGOGASTRODUODENOSCOPY (EGD) WITH PROPOFOL N/A 09/01/2019   Procedure: ESOPHAGOGASTRODUODENOSCOPY (EGD) WITH PROPOFOL;  Surgeon: VLin Landsman MD;  [facility]  Service: Gastroenterology;  Laterality: N/A;   FEMORAL ARTERY STENT  10/2010   right sided (Dr. CBurt Knack   KNEE ARTHROSCOPY WITH MEDIAL MENISECTOMY Right 08/20/2017   Procedure: KNEE ARTHROSCOPY WITH MEDIAL  AND LATERAL MENISECTOMY;  Surgeon: MHessie Knows MD;  Location: [facility] ORS;  Service: Orthopedics;  Laterality: Right;   POSTERIOR CERVICAL LAMINECTOMY N/A 03/08/2018   Procedure: POSTERIOR CERVICAL LAMINECTOMY-C7;  Surgeon: CDeetta Perla MD;  Location: [facility] ORS;  Service: Neurosurgery;  Laterality: N/A;   TONSILLECTOMY     TOTAL KNEE ARTHROPLASTY Right 04/14/2019   Procedure: RIGHT TOTAL KNEE ARTHROPLASTY;  Surgeon: MHessie Knows MD;  Location: [facility] ORS;  Service: Orthopedics;  Laterality: Right;   TUBAL LIGATION      Current Outpatient Medications:    acetaminophen (TYLENOL 8  HOUR) 650 MG CR tablet, Take 2 tablets (1,300 mg total) by mouth 2 (two) times daily as needed for pain., Disp: 60 tablet, Rfl: 1   albuterol (VENTOLIN HFA) 108 (90 Base) MCG/ACT  inhaler, Inhale 2 puffs into the lungs every 6 (six) hours as needed for wheezing or shortness of breath., Disp: 1 g, Rfl: 12   aspirin EC 81 MG tablet, Take 1 tablet (81 mg total) by mouth daily., Disp: 90 tablet, Rfl: 3   Azelastine HCl 0.15 % SOLN, Place 2 sprays into both nostrils daily as needed (allergies)., Disp: 30 mL, Rfl: 11   clobetasol cream (TEMOVATE) 6.94 %, Apply 1 application. topically 2 (two) times daily. Prn left elbow, Disp: 30 g, Rfl: 0   Cyanocobalamin (VITAMIN B-12) 1000 MCG SUBL, Place 0.001 tablets (1 mcg total) under the tongue daily., Disp: 90 tablet, Rfl: 3   dapagliflozin propanediol (FARXIGA) 10 MG TABS tablet, Take 1 tablet (10 mg total) by mouth daily before breakfast., Disp: 90 tablet, Rfl: 3   dicyclomine (BENTYL) 10 MG capsule, Take 1 capsule (10 mg total) by mouth 3 (three) times daily before meals. As needed for abdominal pain, Disp: 120 capsule, Rfl: 11   EPINEPHrine 0.3 mg/0.3 mL IJ SOAJ injection, Inject one pen into the thigh as needed for anaphylactic reaction. Then CALL 911. Use second pen if needed thereafter, Disp: 2 each, Rfl: 5   esomeprazole (NEXIUM) 40 MG capsule, Take 1 capsule (40 mg total) by mouth daily., Disp: 90 capsule, Rfl: 0   Evolocumab (REPATHA SURECLICK) 854 MG/ML SOAJ, INJECT 140MG SUBCUTANEOUSLY EVERY 14 DAYS, Disp: 2 mL, Rfl: 2   ezetimibe (ZETIA) 10 MG tablet, Take 1 tablet (10 mg total) by mouth daily., Disp: 90 tablet, Rfl: 3   fluticasone (FLONASE) 50 MCG/ACT nasal spray, Place 2 sprays into both nostrils daily. Prn nasal congestion, Disp: 16 g, Rfl: 11   Fluticasone-Umeclidin-Vilant (TRELEGY ELLIPTA) 100-62.5-25 MCG/ACT AEPB, Inhale 1 puff into the lungs daily. Rinse mouth, Disp: 1 each, Rfl: 11   furosemide (LASIX) 20 MG tablet, TAKE 1 TABLET BY MOUTH ONCE DAILY AND 1 TABLET AT LUNCH THREE TIMES WEEKLY, Disp: 126 tablet, Rfl: 0   hydrOXYzine (ATARAX) 25 MG tablet, Take 1 tablet (25 mg total) by mouth daily as needed for anxiety.,  Disp: 60 tablet, Rfl: 1   ipratropium (ATROVENT) 0.06 % nasal spray, Place 2 sprays into both nostrils 4 (four) times daily., Disp: 15 mL, Rfl: 12   ipratropium-albuterol (DUONEB) 0.5-2.5 (3) MG/3ML SOLN, USE 1 VIAL IN NEBULIZER TWICE DAILY AS NEEDED FOR WHEEZING OR SHORTNESS OF BREATH, Disp: 360 mL, Rfl: 3   isosorbide mononitrate (IMDUR) 30 MG 24 hr tablet, Take 1 tablet by mouth twice daily, Disp: 180 tablet, Rfl: 2   levocetirizine (XYZAL) 5 MG tablet, Take 1 tablet (5 mg total) by mouth at bedtime as needed for allergies., Disp: 90 tablet, Rfl: 3   linaclotide (LINZESS) 145 MCG CAPS capsule, Take 1 capsule (145 mcg total) by mouth daily before breakfast., Disp: 30 capsule, Rfl: 1   meclizine (ANTIVERT) 12.5 MG tablet, TAKE 1 TABLET BY MOUTH TWICE DAILY AS NEEDED FOR DIZZINESS, Disp: , Rfl:    metoprolol succinate (TOPROL-XL) 50 MG 24 hr tablet, TAKE 1 TABLET BY MOUTH ONCE DAILY WITH OR IMMEDIATELY FOLLOWING A MEAL, Disp: 90 tablet, Rfl: 0   montelukast (SINGULAIR) 10 MG tablet, Take 1 tablet (10 mg total) by mouth at bedtime., Disp: 90 tablet, Rfl: 3   Na Sulfate-K Sulfate-Mg Sulf 17.5-3.13-1.6  GM/177ML SOLN, Take 1 kit by mouth once for 1 dose., Disp: 354 mL, Rfl: 0   nitroGLYCERIN (NITROSTAT) 0.4 MG SL tablet, DISSOLVE ONE TABLET UNDER THE TONGUE EVERY 5 MINUTES AS NEEDED FOR CHEST PAIN.  DO NOT EXCEED A TOTAL OF 3 DOSES IN 15 MINUTES, Disp: 25 tablet, Rfl: 2   olopatadine (PATANOL) 0.1 % ophthalmic solution, Place 1 drop into both eyes 2 (two) times daily., Disp: 15 mL, Rfl: 11   OneTouch Delica Lancets 21J MISC, USE TO CHECK GLUCOSE IN THE MORNING AND AT BEDTIME, Disp: 200 each, Rfl: 0   ONETOUCH VERIO test strip, USE 1 STRIP TO CHECK GLUCOSE TWICE DAILY ONCE  IN  THE  MORNING  AND  ONCE  AT  BEDTIME, Disp: 200 each, Rfl: 0   PARoxetine (PAXIL) 20 MG tablet, Take 1 tablet (20 mg total) by mouth daily., Disp: 90 tablet, Rfl: 3   potassium chloride (KLOR-CON M) 10 MEQ tablet, Take 1 tablet (10  mEq total) by mouth daily. TAKE 1 TABLET ONCE DAILY AND 1 TABLET WITH FUROSEMIDE AT LUNCH THREE TIMES A WEEK, Disp: 90 tablet, Rfl: 3   pregabalin (LYRICA) 75 MG capsule, Take 1 capsule (75 mg total) by mouth daily., Disp: 90 capsule, Rfl: 1   rivaroxaban (XARELTO) 2.5 MG TABS tablet, Take 1 tablet by mouth twice daily, Disp: 180 tablet, Rfl: 1   rosuvastatin (CRESTOR) 20 MG tablet, Take 1 tablet (20 mg total) by mouth at bedtime., Disp: 90 tablet, Rfl: 1   sodium chloride (OCEAN) 0.65 % SOLN nasal spray, Place 2 sprays into both nostrils daily as needed for congestion., Disp: 30 mL, Rfl: 11   tiZANidine (ZANAFLEX) 4 MG tablet, Take 1 tablet (4 mg total) by mouth every 8 (eight) hours as needed for muscle spasms., Disp: 30 tablet, Rfl: 1    Family History  Problem Relation Age of Onset   Other Mother    Breast cancer Mother 64       breast cancer, late 50's   Cancer Mother        breast   Hypertension Father    Heart failure Father    Diabetes Father    Colon cancer Father 108   Glaucoma Father    Cancer Father        colorectal    Heart disease Father    Other Father        glaucoma   Brain cancer Sister    Arthritis Sister    Diabetes Sister    Hypertension Sister    Kidney disease Sister    Diabetes Brother    Hypertension Brother    Acute myelogenous leukemia Grandson        06/2019   Coronary artery disease Neg Hx    Stroke Neg Hx      Social History   Tobacco Use   Smoking status: Former    Packs/day: 2.00    Years: 38.00    Total pack years: 76.00    Types: Cigarettes    Quit date: 08/12/2010    Years since quitting: 11.9   Smokeless tobacco: Never  Vaping Use   Vaping Use: Never used  Substance Use Topics   Alcohol use: No   Drug use: No    Types: Cocaine    Comment: Remote Hx (crack cocaine and marijuana).none since 64yr plus    Allergies as of 07/10/2022 - Review Complete 07/10/2022  Allergen Reaction Noted   Latex Rash 08/23/2010   Shellfish  allergy Anaphylaxis    Sulfa antibiotics Anaphylaxis 08/23/2010   Sulfonamide derivatives Anaphylaxis 08/23/2010   Watermelon [citrullus vulgaris] Other (See Comments) 04/13/2017   Penicillins Itching 08/26/2021   Soy allergy  03/10/2019   Tomato Itching 09/11/2020   Other Swelling 01/20/2012    Review of Systems:    All systems reviewed and negative except where noted in HPI.   Physical Exam:  BP 107/70 (BP Location: Right Arm, Patient Position: Sitting, Cuff Size: Normal)   Pulse 60   Ht _0  (1.651 m)   Wt 205 lb 6.4 oz (93.2 kg)   BMI 34.18 kg/m  No LMP recorded. Patient has had an ablation.  General:   Alert,  Well-developed, well-nourished, pleasant and cooperative in mild distress due to pain Head:  Normocephalic and atraumatic. Eyes:  Sclera clear, no icterus.   Conjunctiva pink. Ears:  Normal auditory acuity. Nose:  No deformity, discharge, or lesions. Mouth:  No deformity or lesions,oropharynx pink & moist. Neck:  Supple; no masses or thyromegaly. Lungs:  Respirations even and unlabored.  Clear throughout to auscultation.   No wheezes, crackles, or rhonchi. No acute distress. Heart:  Regular rate and rhythm; no murmurs, clicks, rubs, or gallops. Abdomen:  Normal bowel sounds. Soft, nontender and moderately distended, tympanic to percussion without masses, mild epigastric tenderness, no hepatosplenomegaly or hernias noted.  no rebound tenderness.   Rectal: Not performed Msk:  Symmetrical without gross deformities. Good, equal movement & strength bilaterally. Pulses:  Normal pulses noted. Extremities:  No clubbing or edema.  No cyanosis. Neurologic:  Alert and oriented x3;  grossly normal neurologically. Skin:  Intact without significant lesions or rashes. No jaundice. Psych:  Alert and cooperative. Normal mood and affect.  Imaging Studies: Reviewed  Assessment and Plan:   [name] is a 61 y.o. female with metabolic syndrome, chronic constipation, history  of gastric ulcers, H. pylori negative, confirmed healing of gastric ulcers is seen in consultation for upper abdominal discomfort, abdominal bloating, nausea which has been ongoing for 1 year with worsening of constipation since discontinuation of Linzess  Upper abdominal discomfort with bloating, early satiety Serum lipase was elevated on 12/5 when checked by her PCP.  LFTs were normal Awaiting CT abdomen and pelvis results, this was performed on 12/13 Continue Nexium 40 mg daily Advised patient regarding small frequent meals Recommend gastric emptying study given chronicity of symptoms and longstanding history of diabetes   Chronic constipation  History of sigmoid diverticulosis Reiterated on importance of high-fiber diet, adequate intake of water and fiber supplements as needed Continue Linzess 145 MCG every other day, if this does not work, will go down to 72 mcg daily Patient does not consume any carbonated beverages or red meat or white bread  Tubular adenomas of colon Scheduled surveillance colonoscopy on 07/15/2022  Follow-up as needed   Cephas Darby, MD

## 2022-07-10 NOTE — Patient Instructions (Addendum)
Colonoscopy 07/15/22 Call [facility] on 12/18 at 1pm for Arrival time.  See Colonoscopy Instruction Page. -Stop Wilder [place]ade 12/16 -Stop Xarelto 12/17   Gastric Emptying Study [facility] Friday 12/29 at 8:30am -Stop Nexium 12/28 -Do Not Eat or Drink Anything 6 hours prior to study.  Follow Up in 6 months.

## 2022-07-12 ENCOUNTER — Encounter: Payer: Self-pay | Admitting: Family Medicine

## 2022-07-12 DIAGNOSIS — R300 Dysuria: Secondary | ICD-10-CM | POA: Insufficient documentation

## 2022-07-12 DIAGNOSIS — R1084 Generalized abdominal pain: Secondary | ICD-10-CM | POA: Insufficient documentation

## 2022-07-12 NOTE — Assessment & Plan Note (Deleted)
Suspect constipation playing a role in abdominal pain given generalized pain and history of IBS. Recommend continuing Linzess daily

## 2022-07-12 NOTE — Assessment & Plan Note (Signed)
Suspect constipation playing a role in abdominal pain given generalized pain and history of IBS. Recommend continuing Linzess daily

## 2022-07-12 NOTE — Assessment & Plan Note (Addendum)
Generalized abdominal pain and now reports weight loss and no appetite.  Has stopped Linzess secondary to vomiting.  Weight remains stable from last visit. Generalized abdominal pain on exam. CT abdomen with contrast.  Rule out obstruction GI referral Need colonoscopy and possible gastric emptying studies if CT negative given history of diabetes T2 Strict return precautions provided

## 2022-07-12 NOTE — Assessment & Plan Note (Deleted)
Generalized abdominal pain and now reports weight loss and no appetite.  Has stopped Linzess secondary to vomiting.  Weight remains stable from last visit. Generalized abdominal pain on exam. CT abdomen with contrast.  Rule out obstruction GI referral Need colonoscopy and possible gastric emptying studies if CT negative. Strict return precautions provided

## 2022-07-12 NOTE — Assessment & Plan Note (Signed)
Urine negative for infection Increase fluids, cranberry juice

## 2022-07-15 ENCOUNTER — Ambulatory Visit
Admission: RE | Admit: 2022-07-15 | Discharge: 2022-07-15 | Disposition: A | Discharge status: Home / Self Care | Payer: Medicare HMO | Attending: Gastroenterology | Admitting: Gastroenterology

## 2022-07-15 ENCOUNTER — Encounter: Payer: Self-pay | Admitting: Gastroenterology

## 2022-07-15 ENCOUNTER — Encounter
Admission: RE | Disposition: A | Discharge status: Home / Self Care | Payer: Self-pay | Source: Home / Self Care | Attending: Gastroenterology

## 2022-07-15 ENCOUNTER — Ambulatory Visit: Payer: Medicare HMO | Admitting: Anesthesiology

## 2022-07-15 DIAGNOSIS — D332 Benign neoplasm of brain, unspecified: Secondary | ICD-10-CM | POA: Insufficient documentation

## 2022-07-15 DIAGNOSIS — Z87891 Personal history of nicotine dependence: Secondary | ICD-10-CM | POA: Insufficient documentation

## 2022-07-15 DIAGNOSIS — Z951 Presence of aortocoronary bypass graft: Secondary | ICD-10-CM | POA: Diagnosis not present

## 2022-07-15 DIAGNOSIS — E1151 Type 2 diabetes mellitus with diabetic peripheral angiopathy without gangrene: Secondary | ICD-10-CM | POA: Diagnosis not present

## 2022-07-15 DIAGNOSIS — Z800 Family history of malignant neoplasm of digestive organs: Secondary | ICD-10-CM | POA: Diagnosis not present

## 2022-07-15 DIAGNOSIS — Z96651 Presence of right artificial knee joint: Secondary | ICD-10-CM | POA: Diagnosis not present

## 2022-07-15 DIAGNOSIS — G4730 Sleep apnea, unspecified: Secondary | ICD-10-CM | POA: Insufficient documentation

## 2022-07-15 DIAGNOSIS — Z860100 Personal history of colon polyps, unspecified: Secondary | ICD-10-CM

## 2022-07-15 DIAGNOSIS — I2510 Atherosclerotic heart disease of native coronary artery without angina pectoris: Secondary | ICD-10-CM | POA: Diagnosis not present

## 2022-07-15 DIAGNOSIS — I252 Old myocardial infarction: Secondary | ICD-10-CM | POA: Diagnosis not present

## 2022-07-15 DIAGNOSIS — I255 Ischemic cardiomyopathy: Secondary | ICD-10-CM | POA: Diagnosis not present

## 2022-07-15 DIAGNOSIS — K219 Gastro-esophageal reflux disease without esophagitis: Secondary | ICD-10-CM | POA: Diagnosis not present

## 2022-07-15 DIAGNOSIS — F32A Depression, unspecified: Secondary | ICD-10-CM | POA: Diagnosis not present

## 2022-07-15 DIAGNOSIS — Z955 Presence of coronary angioplasty implant and graft: Secondary | ICD-10-CM | POA: Diagnosis not present

## 2022-07-15 DIAGNOSIS — I5042 Chronic combined systolic (congestive) and diastolic (congestive) heart failure: Secondary | ICD-10-CM | POA: Insufficient documentation

## 2022-07-15 DIAGNOSIS — M1990 Unspecified osteoarthritis, unspecified site: Secondary | ICD-10-CM | POA: Diagnosis not present

## 2022-07-15 DIAGNOSIS — J449 Chronic obstructive pulmonary disease, unspecified: Secondary | ICD-10-CM | POA: Diagnosis not present

## 2022-07-15 DIAGNOSIS — I110 Hypertensive heart disease with heart failure: Secondary | ICD-10-CM | POA: Insufficient documentation

## 2022-07-15 DIAGNOSIS — Z9049 Acquired absence of other specified parts of digestive tract: Secondary | ICD-10-CM | POA: Insufficient documentation

## 2022-07-15 DIAGNOSIS — F419 Anxiety disorder, unspecified: Secondary | ICD-10-CM | POA: Diagnosis not present

## 2022-07-15 DIAGNOSIS — Z7984 Long term (current) use of oral hypoglycemic drugs: Secondary | ICD-10-CM | POA: Insufficient documentation

## 2022-07-15 DIAGNOSIS — D124 Benign neoplasm of descending colon: Secondary | ICD-10-CM

## 2022-07-15 DIAGNOSIS — E785 Hyperlipidemia, unspecified: Secondary | ICD-10-CM | POA: Diagnosis not present

## 2022-07-15 DIAGNOSIS — D123 Benign neoplasm of transverse colon: Secondary | ICD-10-CM | POA: Diagnosis not present

## 2022-07-15 DIAGNOSIS — I509 Heart failure, unspecified: Secondary | ICD-10-CM | POA: Diagnosis not present

## 2022-07-15 DIAGNOSIS — Z8541 Personal history of malignant neoplasm of cervix uteri: Secondary | ICD-10-CM | POA: Insufficient documentation

## 2022-07-15 DIAGNOSIS — K635 Polyp of colon: Secondary | ICD-10-CM | POA: Diagnosis not present

## 2022-07-15 HISTORY — PX: COLONOSCOPY WITH PROPOFOL: SHX5780

## 2022-07-15 LAB — GLUCOSE, CAPILLARY: Glucose-Capillary: 99 mg/dL (ref 70–99)

## 2022-07-15 SURGERY — COLONOSCOPY WITH PROPOFOL
Anesthesia: General

## 2022-07-15 MED ORDER — LIDOCAINE HCL (CARDIAC) PF 100 MG/5ML IV SOSY
PREFILLED_SYRINGE | INTRAVENOUS | Status: DC | PRN
  Administered 2022-07-15: 50 mg via INTRAVENOUS

## 2022-07-15 MED ORDER — STERILE WATER FOR IRRIGATION IR SOLN
Status: DC | PRN
  Administered 2022-07-15: 60 mL

## 2022-07-15 MED ORDER — PROPOFOL 500 MG/50ML IV EMUL
INTRAVENOUS | Status: DC | PRN
  Administered 2022-07-15: 100 ug/kg/min via INTRAVENOUS

## 2022-07-15 MED ORDER — SODIUM CHLORIDE 0.9 % IV SOLN: INTRAVENOUS | Status: DC

## 2022-07-15 MED ORDER — DEXMEDETOMIDINE HCL IN NACL 200 MCG/50ML IV SOLN
INTRAVENOUS | Status: DC | PRN
  Administered 2022-07-15: 4 ug via INTRAVENOUS
  Administered 2022-07-15: 8 ug via INTRAVENOUS

## 2022-07-15 MED ORDER — PROPOFOL 10 MG/ML IV BOLUS
INTRAVENOUS | Status: DC | PRN
  Administered 2022-07-15: 90 mg via INTRAVENOUS

## 2022-07-15 NOTE — Anesthesia Preprocedure Evaluation (Addendum)
Anesthesia Evaluation  Patient identified by MRN, date of birth, ID band Patient awake    Reviewed: Allergy & Precautions, NPO status , Patient's Chart, lab work & pertinent test results  Airway Mallampati: II  TM Distance: >3 FB Neck ROM: full    Dental  (+) Chipped   Pulmonary shortness of breath, asthma , sleep apnea , COPD, former smoker   Pulmonary exam normal        Cardiovascular hypertension, + CAD, + Past MI, + Peripheral Vascular Disease and +CHF  Normal cardiovascular exam+ Valvular Problems/Murmurs   9/23 ECHO 1. Left ventricular ejection fraction, by estimation, is 45 to 50% (Improved from priors). The  left ventricle has mildly decreased function. The left ventricle has no  regional wall motion abnormalities. Left ventricular diastolic parameters  are consistent with Grade II  diastolic dysfunction (pseudonormalization). The average left ventricular  global longitudinal strain is -8.0 %.   2. Right ventricular systolic function is moderately reduced. The right  ventricular size is moderately enlarged. There is mildly elevated  pulmonary artery systolic pressure. The estimated right ventricular  systolic pressure is 26.8 mmHg.   3. Left atrial size was mildly dilated.   4. The mitral valve is normal in structure. Mild to moderate mitral valve  regurgitation. No evidence of mitral stenosis.   5. Tricuspid valve regurgitation is moderate.   6. The aortic valve is normal in structure. There is mild calcification  of the aortic valve. Aortic valve regurgitation is not visualized. Aortic  valve sclerosis/calcification is present, without any evidence of aortic  stenosis.   7. The inferior vena cava is normal in size with greater than 50%  respiratory variability, suggesting right atrial pressure of 3 mmHg.   Pt feels she is at baseline regarding phyical ability    Neuro/Psych  Headaches PSYCHIATRIC DISORDERS Anxiety  Depression     Neuromuscular disease    GI/Hepatic PUD,GERD  Controlled,,(+) Hepatitis -  Endo/Other  diabetes    Renal/GU negative Renal ROS  negative genitourinary   Musculoskeletal  (+) Arthritis ,    Abdominal   Peds  Hematology  (+) Blood dyscrasia, anemia   Anesthesia Other Findings Past Medical History: No date: Abnormal feces 05/14/2020: Abnormality of both breasts on screening mammogram No date: Acute esophagogastric ulcer No date: Allergy No date: Anemia 01/14/2021: Annual physical exam 04/05/2018: Anxiety and depression No date: Arthritis No date: Asthma 01/20/2014: Atypical chest pain 06/19/2015: Back ache No date: Bacterial vaginitis 02/22/2020: Benign breast cyst in female, right No date: Benign neoplasm of ascending colon 04/19/2020: Bilateral hip pain No date: Blood in stool No date: Brachial neuritis or radiculitis NOS 07/2017: 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 being followed by neurosurgery/eye               doctor and pcp. monitoring size.causes sinus problems 01/14/2021: Breast mass, right No date: Bronchitis 08/26/2018: Bunion of right foot No date: Cardiac arrhythmia 06/19/2015: CCF (congestive cardiac failure) (HCC) No date: Cervical neck pain with evidence of disc disease     Comment:  C5/6 disease, MRI done late 2012 - no records available No date: Chronic combined systolic and diastolic CHF (congestive  heart failure) ([place])     Comment:  a. 08/2010 Echo: mildly reduced EF 40-45%, mild diffuse               hypokinesis; b. 06/2016 Echo: EF 50%, no rwma, mild to  mod TR; c. 03/2017 Echo: EF 40-45%, Gr1 DD (prior echo               reviewed and EF felt to be lower than reported). 12/18/2017: Chronic neck pain No date: Chronic pain 07/24/2021: Chronic pain of inguinal region 08/23/2010: CLAUDICATION     Comment:  Qualifier: Diagnosis of   By: Tamala Julian RN, Megan    No date: Cocaine abuse, in remission  ([place])     Comment:  clean x 24 years 06/19/2015: Colon polyp     Comment:  2010 No date: Colon polyps No date: COPD (chronic obstructive pulmonary disease) ([place])     Comment:  a. 12/2018 PFT: No obvious obst/restrictive dzs. 03/12/2022: COPD exacerbation ([place]) No date: Coronary artery disease     Comment:  a. PCI of LCX 2003; b. PCI of the LAD 2012 with a (2.5 x              8 mm BMS);  c.s/p CABG 4/12: L-LAD, VG-Dx, VG-OM, VG-RCA               (Dr. Prescott Gum);  d. 01/2012 MV: inf infarct, attenuation,              no ischemia; e. 02/2017 MV: signifi attenuation artifact.               Fixed basal antlat/inflat scar vs artifact. Reversible               apical lat and mid antlat defect - ? atten vs ischemia.               F/u echo w/o wma->Med rx. 08/23/2010: Coronary artery disease involving native coronary artery  of native heart without angina pectoris No date: Depression No date: Depression No date: Diabetes mellitus without complication ([place]) 62/70/3500: Fall No date: Family history of colon cancer 06/19/2015: Family history of colon cancer No date: Gastritis without bleeding No date: Generalized headaches     Comment:  frequent No date: GERD (gastroesophageal reflux disease) No date: Headache No date: Heart murmur No date: Hematochezia 01/11/2015: Hematochezia No date: Hepatitis     Comment:  history of hepatitis b No date: History of cervical cancer     Comment:  s/p cryotherapy No date: History of cervical cancer     Comment:  s/p cryotherapy 01/11/2015: History of colonic polyps No date: History of drug abuse ([place])     Comment:  cocaine, marijuana, clean since 1989 No date: History of drug abuse ([place])     Comment:  cocaine, marijuana, clean since 1989 No date: History of hepatitis B     Comment:  from eating undercooked liver No date: History of MI (myocardial infarction) 12/20/2011: HTN (hypertension) No date: Hyperlipidemia No date: Hypertension 04/19/2020:  Insomnia No date: Intractable vomiting with nausea No date: Intractable vomiting with nausea No date: Ischemic cardiomyopathy     Comment:  a. 03/2017 Echo: EF 40-45%, Gr1 DD. 08/30/2020: Lipoma of right lower extremity 10/13/2019: Mild intermittent asthma 2003, 2012: Myocardial infarction ([place]) 07/06/2022: Need for pneumococcal 20-valent conjugate vaccination 02/24/2018: Numbness and tingling in right hand 04/30/2015: Orthostatic hypotension No date: PAD (peripheral artery disease) ([place])     Comment:  a. s/p Right SFA atherectomy and PTA 01/15/11; b. 07/2018               ABI: R 1.02, L 1.09. No date: Pituitary mass [facility])     Comment:  a. 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 Mass abuts R cavernous sinus w/o definite               invasion. 07/24/2021: PND (post-nasal drip) No date: Polyp of colon 09/21/2020: Pruritus of both eyes 12/20/2011: Right knee pain     Comment:  Abnormal MRI right knee 08/29/2016 [facility]                   FINDINGS: Medial compartment: [There is abnormal linear               signal within the medial meniscus near junction of               posterior horn and mid body which abuts the tibial               undersurface consistent with nondisplaced tear (5:8-10).               The articular cartilage, and subchondral bone are normal.              3 mm bone island (7:14).  Lateral compartment: There is              abnormal globular 12/16/2010: S/P CABG x 4 No date: Seasonal allergies 08/23/2010: Shortness of breath     Comment:  Qualifier: Diagnosis of   By: STamala JulianRN, Megan    04/19/2021: Skin lesion No date: Sleep apnea     Comment:  uses cpap No date: Smoking history     Comment:  quit 07/2010 12/16/2010: Smoking history     Comment:  Quit 07/2010 04/14/2019: Status post total knee replacement using cement, right 04/19/2021: Thrush No date: Thyroid disease No date: Urinary incontinence 12/18/2017: Urinary  incontinence 04/19/2021: Vaginal itching 11/24/2016: Vasomotor flushing  Past Surgical History: No date: ABDOMINAL HYSTERECTOMY     Comment:  2003 1986: CHOLECYSTECTOMY 2008: COLONOSCOPY     Comment:  3 polyps 02/06/2015: COLONOSCOPY WITH PROPOFOL; N/A     Comment:  Procedure: COLONOSCOPY WITH PROPOFOL;  Surgeon: DLucilla Lame MD;  Location: [facility] ENDOSCOPY;  Service: Endoscopy;              Laterality: N/A; 01/27/2017: COLONOSCOPY WITH PROPOFOL; N/A     Comment:  Procedure: COLONOSCOPY WITH PROPOFOL;  Surgeon: WLucilla Lame MD;  Location: [facility] ENDOSCOPY;  Service:               Endoscopy;  Laterality: N/A; No date: CORONARY ANGIOPLASTY     Comment:  w/ stent placement x2 2012: CORONARY ARTERY BYPASS GRAFT     Comment:  Dr GRockey Situ2003: CORONARY STENT PLACEMENT     Comment:  S/P MI 2007: CORONARY STENT PLACEMENT     Comment:  [place] 02/06/2015: ESOPHAGOGASTRODUODENOSCOPY (EGD) WITH PROPOFOL; N/A     Comment:  Procedure: ESOPHAGOGASTRODUODENOSCOPY (EGD) WITH               PROPOFOL;  Surgeon: DLucilla Lame MD;  Location: [facility]               ENDOSCOPY;  Service: Endoscopy;  Laterality: N/A; 01/27/2017: ESOPHAGOGASTRODUODENOSCOPY (EGD) WITH PROPOFOL; N/A     Comment:  Procedure: ESOPHAGOGASTRODUODENOSCOPY (EGD) WITH  PROPOFOL;  Surgeon: Lucilla Lame, MD;  Location: [facility]               ENDOSCOPY;  Service: Endoscopy;  Laterality: N/A; 09/01/2019: ESOPHAGOGASTRODUODENOSCOPY (EGD) WITH PROPOFOL; N/A     Comment:  Procedure: ESOPHAGOGASTRODUODENOSCOPY (EGD) WITH               PROPOFOL;  Surgeon: Lin Landsman, MD;  Location:               [facility] ENDOSCOPY;  Service: Gastroenterology;  Laterality:               N/A; 10/2010: FEMORAL ARTERY STENT     Comment:  right sided (Dr. Burt Knack) 08/20/2017: KNEE ARTHROSCOPY WITH MEDIAL MENISECTOMY; Right     Comment:  Procedure: KNEE ARTHROSCOPY WITH MEDIAL  AND LATERAL               MENISECTOMY;  Surgeon: Hessie Knows, MD;  Location: [facility]              ORS;  Service: Orthopedics;  Laterality: Right; 03/08/2018: POSTERIOR CERVICAL LAMINECTOMY; N/A     Comment:  Procedure: POSTERIOR CERVICAL LAMINECTOMY-C7;  Surgeon:               Deetta Perla, MD;  Location: [facility] ORS;  Service:               Neurosurgery;  Laterality: N/A; No date: TONSILLECTOMY 04/14/2019: TOTAL KNEE ARTHROPLASTY; Right     Comment:  Procedure: RIGHT TOTAL KNEE ARTHROPLASTY;  Surgeon:               Hessie Knows, MD;  Location: [facility] ORS;  Service:               Orthopedics;  Laterality: Right; No date: TUBAL LIGATION  BMI    Body Mass Index: 33.12 kg/m      Reproductive/Obstetrics negative OB ROS                             Anesthesia Physical Anesthesia Plan  ASA: 3  Anesthesia Plan: General   Post-op Pain Management:    Induction: Intravenous  PONV Risk Score and Plan: Propofol infusion and TIVA  Airway Management Planned: Natural Airway and Nasal Cannula  Additional Equipment:   Intra-op Plan:   Post-operative Plan:   Informed Consent: I have reviewed the patients History and Physical, chart, labs and discussed the procedure including the risks, benefits and alternatives for the proposed anesthesia with the patient or authorized representative who has indicated his/her understanding and acceptance.     Dental Advisory Given  Plan Discussed with: Anesthesiologist, CRNA and Surgeon  Anesthesia Plan Comments: (Patient consented for risks of anesthesia including but not limited to:  - adverse reactions to medications - risk of airway placement if required - damage to eyes, teeth, lips or other oral mucosa - nerve damage due to positioning  - sore throat or hoarseness - Damage to heart, brain, nerves, lungs, other parts of body or loss of life  Patient voiced understanding.)       Anesthesia Quick Evaluation

## 2022-07-15 NOTE — Op Note (Addendum)
[facility] Patient Name: [name] Procedure Date: 07/15/2022 11:51 AM MRN: [id number] Account #: [id number] Date of Birth: 06/25/1961 Admit Type: Outpatient Age: 61 Room: [facility] ENDO ROOM 3 Gender: Female Note Status: Finalized Instrument Name: Jasper Riling [id number] Procedure:             Colonoscopy Indications:           High risk colon cancer surveillance: Personal history                         of colonic polyps, Surveillance: Personal history of                         adenomatous polyps on last colonoscopy 5 years ago,                         Last colonoscopy: July 2018 Providers:             Lin Landsman MD, MD Referring MD:          Lin Landsman MD, MD (Referring MD) Medicines:             General Anesthesia Complications:         No immediate complications. Estimated blood loss: None. Procedure:             Pre-Anesthesia Assessment:                        - Prior to the procedure, a History and Physical was                         performed, and patient medications and allergies were                         reviewed. The patient is competent. The risks and                         benefits of the procedure and the sedation options and                         risks were discussed with the patient. All questions                         were answered and informed consent was obtained.                         Patient identification and proposed procedure were                         verified by the physician, the nurse, the                         anesthesiologist, the anesthetist and the technician                         in the pre-procedure area in the procedure room in the                         endoscopy suite. Mental Status Examination: alert and  oriented. Airway Examination: normal oropharyngeal                         airway and neck mobility. Respiratory Examination:                          clear to auscultation. CV Examination: normal.                         Prophylactic Antibiotics: The patient does not require                         prophylactic antibiotics. Prior Anticoagulants: The                         patient has taken Xarelto (rivaroxaban), last dose was                         3 days prior to procedure. ASA Grade Assessment: III -                         A patient with severe systemic disease. After                         reviewing the risks and benefits, the patient was                         deemed in satisfactory condition to undergo the                         procedure. The anesthesia plan was to use general                         anesthesia. Immediately prior to administration of                         medications, the patient was re-assessed for adequacy                         to receive sedatives. The heart rate, respiratory                         rate, oxygen saturations, blood pressure, adequacy of                         pulmonary ventilation, and response to care were                         monitored throughout the procedure. The physical                         status of the patient was re-assessed after the                         procedure.                        After obtaining informed consent, the colonoscope was  passed under direct vision. Throughout the procedure,                         the patient's blood pressure, pulse, and oxygen                         saturations were monitored continuously. The                         Colonoscope was introduced through the anus and                         advanced to the the cecum, identified by appendiceal                         orifice and ileocecal valve. The colonoscopy was                         performed without difficulty. The patient tolerated                         the procedure well. The quality of the bowel                         preparation was evaluated  using the BBPS [facility] Bowel                         Preparation Scale) with scores of: Right Colon = 3,                         Transverse Colon = 3 and Left Colon = 3 (entire mucosa                         seen well with no residual staining, small fragments                         of stool or opaque liquid). The total BBPS score                         equals 9. The ileocecal valve, appendiceal orifice,                         and rectum were photographed. Findings:      The perianal and digital rectal examinations were normal. Pertinent       negatives include normal sphincter tone and no palpable rectal lesions.      A 6 mm polyp was found in the transverse colon. The polyp was sessile.       The polyp was removed with a cold snare. Resection and retrieval were       complete. Estimated blood loss was minimal. To prevent bleeding after       the polypectomy, three hemostatic clips were successfully placed (MR       conditional). Clip manufacturer: [employer]. There was no       bleeding at the end of the procedure.      Four sessile polyps were found in the transverse colon. The polyps were       3 to 4  mm in size. These polyps were removed with a cold snare.       Resection and retrieval were complete.      Four sessile polyps were found in the descending colon. The polyps were       4 to 5 mm in size. These polyps were removed with a cold snare.       Resection and retrieval were complete. Estimated blood loss: none.      The retroflexed view of the distal rectum and anal verge was normal and       showed no anal or rectal abnormalities.      The exam was otherwise without abnormality. Impression:            - One 6 mm polyp in the transverse colon, removed with                         a cold snare. Resected and retrieved. Clips (MR                         conditional) were placed. Clip manufacturer: [employer].                        - Four 3  to 4 mm polyps in the transverse colon,                         removed with a cold snare. Resected and retrieved.                        - Four 4 to 5 mm polyps in the descending colon,                         removed with a cold snare. Resected and retrieved.                        - The distal rectum and anal verge are normal on                         retroflexion view.                        - The examination was otherwise normal. Recommendation:        - Discharge patient to home (with escort).                        - Resume previous diet today.                        - Continue present medications.                        - Await pathology results.                        - Repeat colonoscopy in 3 years for surveillance of                         multiple  polyps.                        - Resume Xarelto (rivaroxaban) in 2 days at prior                         dose. Refer to managing physician for further                         adjustment of therapy. Procedure Code(s):     --- Professional ---                        [phone], Colonoscopy, flexible; with removal of                         tumor(s), polyp(s), or other lesion(s) by snare                         technique Diagnosis Code(s):     --- Professional ---                        D12.3, Benign neoplasm of transverse colon (hepatic                         flexure or splenic flexure)                        Z86.010, Personal history of colonic polyps                        D12.4, Benign neoplasm of descending colon CPT copyright 2022 American [employer] Association. All rights reserved. The codes documented in this report are preliminary and upon coder review may  be revised to meet current compliance requirements. Dr. Ulyess Mort Lin Landsman MD, MD 07/15/2022 12:25:08 PM This report has been signed electronically. Number of Addenda: 0 Note Initiated On: 07/15/2022 11:51 AM Scope Withdrawal Time: 0 hours 21 minutes 55  seconds  Total Procedure Duration: 0 hours 24 minutes 23 seconds  Estimated Blood Loss:  Estimated blood loss: none.      [facility]

## 2022-07-15 NOTE — Anesthesia Postprocedure Evaluation (Signed)
Anesthesia Post Note  Patient: [name]  Procedure(s) Performed: COLONOSCOPY WITH PROPOFOL  Patient location during evaluation: Endoscopy Anesthesia Type: General Level of consciousness: awake and alert Pain management: pain level controlled Vital Signs Assessment: post-procedure vital signs reviewed and stable Respiratory status: spontaneous breathing, nonlabored ventilation and respiratory function stable Cardiovascular status: blood pressure returned to baseline and stable Postop Assessment: no apparent nausea or vomiting Anesthetic complications: no   No notable events documented.   Last Vitals:  Vitals:   07/15/22 1227 07/15/22 1237  BP: (!) 89/65 95/65  Pulse: 69 70  Resp: (!) 21 18  Temp: (!) 35.8 C   SpO2: 99% 97%    Last Pain:  Vitals:   07/15/22 1237  TempSrc:   PainSc: 0-No pain                 Alphonsus Sias

## 2022-07-15 NOTE — H&P (Signed)
Lorraine Darby, MD [address]  [place], [place] [address]  Main: [phone]  Fax: [phone] Pager: [phone]  Primary Care Physician:  Carollee Leitz, MD Primary Gastroenterologist:  Dr. Cephas Ward  Pre-Procedure History & Physical: HPI:  [name] is a 61 y.o. female is here for an colonoscopy.   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 being followed by neurosurgery/eye doctor and pcp. monitoring size.causes sinus problems   Breast mass, right 01/14/2021   Bronchitis    Bunion of right foot 08/26/2018   Cardiac arrhythmia    CCF (congestive cardiac failure) ([place]) 06/19/2015   Cervical neck pain with evidence of disc disease    C5/6 disease, MRI done late 2012 - no records available   Chronic combined systolic and diastolic CHF (congestive heart failure) ([place])    a. 08/2010 Echo: mildly reduced EF 40-45%, mild diffuse hypokinesis; b. 06/2016 Echo: EF 50%, no rwma, mild to mod TR; c. 03/2017 Echo: EF 40-45%, Gr1 DD (prior echo reviewed and EF felt to be lower than reported).   Chronic neck pain 12/18/2017   Chronic pain    Chronic pain of inguinal region 07/24/2021   CLAUDICATION 08/23/2010   Qualifier: Diagnosis of   By: Tamala Julian RN, Megan       Cocaine abuse, in remission ([place])    clean x 24 years   Colon polyp 06/19/2015   2010   Colon polyps    COPD (chronic obstructive pulmonary disease) ([place])    a.  12/2018 PFT: No obvious obst/restrictive dzs.   COPD exacerbation ([place]) 03/12/2022   Coronary artery disease    a. PCI of LCX 2003; b. PCI of the LAD 2012 with a (2.5 x 8 mm BMS);  c.s/p CABG 4/12: L-LAD, VG-Dx, VG-OM, VG-RCA (Dr. Prescott Gum);  d. 01/2012 MV: inf infarct, attenuation, no ischemia; e. 02/2017 MV: signifi attenuation artifact. Fixed basal antlat/inflat scar vs artifact. Reversible apical lat and mid antlat defect - ? atten vs ischemia. F/u echo w/o wma->Med rx.   Coronary artery disease involving native coronary artery of native heart without angina pectoris 08/23/2010   Depression    Depression    Diabetes mellitus without complication [facility])    Fall 05/14/2018   Family history of colon cancer    Family history of colon cancer 06/19/2015   Gastritis without bleeding    Generalized headaches    frequent   GERD (gastroesophageal reflux disease)    Headache    Heart murmur    Hematochezia    Hematochezia 01/11/2015   Hepatitis    history of hepatitis b   History of cervical cancer    s/p cryotherapy   History of cervical cancer    s/p cryotherapy   History of colonic polyps 01/11/2015   History of drug abuse ([place])  cocaine, marijuana, clean since 1989   History of drug abuse ([place])    cocaine, marijuana, clean since 1989   History of hepatitis B    from eating undercooked liver   History of MI (myocardial infarction)    HTN (hypertension) 12/20/2011   Hyperlipidemia    Hypertension    Insomnia 04/19/2020   Intractable vomiting with nausea    Intractable vomiting with nausea    Ischemic cardiomyopathy    a. 03/2017 Echo: EF 40-45%, Gr1 DD.   Lipoma of right lower extremity 08/30/2020   Mild intermittent asthma 10/13/2019   Myocardial infarction [facility]) 2003, 2012   Need for pneumococcal 20-valent conjugate vaccination 07/06/2022   Numbness and tingling in right hand 02/24/2018   Orthostatic hypotension 04/30/2015   PAD (peripheral artery disease) ([place])    a. s/p  Right SFA atherectomy and PTA 01/15/11; b. 07/2018 ABI: R 1.02, L 1.09.   Pituitary mass ([place])    a. 12/2018 MRI Brain: Stable pituitary mass w/ 14m area of necrosis. Mass abuts R cavernous sinus w/o definite invasion.   PND (post-nasal drip) 07/24/2021   Polyp of colon    Pruritus of both eyes 09/21/2020   Right knee pain 12/20/2011   Abnormal MRI right knee 08/29/2016 f/u [id number]      FINDINGS: Medial compartment: [There is abnormal linear signal within the medial meniscus near junction of posterior horn and mid body which abuts the tibial undersurface consistent with nondisplaced tear (5:8-10). The articular cartilage, and subchondral bone are normal. 3 mm bone island (7:14).  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   Seasonal allergies    Shortness of breath 08/23/2010   Qualifier: Diagnosis of   By: STamala JulianRN, Megan       Skin lesion 04/19/2021   Sleep apnea    uses cpap   Smoking history    quit 07/2010   Smoking history 12/16/2010   Quit 07/2010   Status post total knee replacement using cement, right 04/14/2019   Thrush 04/19/2021   Thyroid disease    Urinary incontinence    Urinary incontinence 12/18/2017   Vaginal itching 04/19/2021   Vasomotor flushing 11/24/2016    Past Surgical History:  Procedure Laterality Date   ABDOMINAL HYSTERECTOMY     2003   CHOLECYSTECTOMY  1986   COLONOSCOPY  2008   3 polyps   COLONOSCOPY WITH PROPOFOL N/A 02/06/2015   Procedure: COLONOSCOPY WITH PROPOFOL;  Surgeon: DLucilla Lame MD;  Location: [facility] ENDOSCOPY;  Service: Endoscopy;  Laterality: N/A;   COLONOSCOPY WITH PROPOFOL N/A 01/27/2017   Procedure: COLONOSCOPY WITH PROPOFOL;  Surgeon: WLucilla Lame MD;  [facility];  Service: Endoscopy;  Laterality: N/A;   CORONARY ANGIOPLASTY     w/ stent placement x2   CORONARY ARTERY BYPASS GRAFT  2012   Dr GRockey Situ  CORONARY STENT PLACEMENT  2003   S/P MI   CORONARY STENT PLACEMENT  2007   [place]    ESOPHAGOGASTRODUODENOSCOPY (EGD) WITH PROPOFOL N/A 02/06/2015   Procedure: ESOPHAGOGASTRODUODENOSCOPY (EGD) WITH PROPOFOL;  Surgeon: DLucilla Lame MD;  Location: [facility] ENDOSCOPY;  Service: Endoscopy;  Laterality: N/A;   ESOPHAGOGASTRODUODENOSCOPY (EGD) WITH PROPOFOL N/A 01/27/2017   Procedure: ESOPHAGOGASTRODUODENOSCOPY (EGD) WITH PROPOFOL;  Surgeon: WLucilla Lame MD;  Location: [facility] ENDOSCOPY;  Service: Endoscopy;  Laterality: N/A;   ESOPHAGOGASTRODUODENOSCOPY (EGD) WITH PROPOFOL N/A 09/01/2019   Procedure: ESOPHAGOGASTRODUODENOSCOPY (EGD) WITH PROPOFOL;  Surgeon: VLin Landsman MD;  [facility]  Service: Gastroenterology;  Laterality: N/A;   FEMORAL ARTERY STENT  10/2010   right sided (Dr. Burt Knack)   KNEE ARTHROSCOPY WITH MEDIAL MENISECTOMY Right 08/20/2017   Procedure: KNEE ARTHROSCOPY WITH MEDIAL  AND LATERAL MENISECTOMY;  Surgeon: Hessie Knows, MD;  Location: [facility] ORS;  Service: Orthopedics;  Laterality: Right;   POSTERIOR CERVICAL LAMINECTOMY N/A 03/08/2018   Procedure: POSTERIOR CERVICAL LAMINECTOMY-C7;  Surgeon: Deetta Perla, MD;  Location: [facility] ORS;  Service: Neurosurgery;  Laterality: N/A;   TONSILLECTOMY     TOTAL KNEE ARTHROPLASTY Right 04/14/2019   Procedure: RIGHT TOTAL KNEE ARTHROPLASTY;  Surgeon: Hessie Knows, MD;  Location: [facility] ORS;  Service: Orthopedics;  Laterality: Right;   TUBAL LIGATION      Prior to Admission medications   Medication Sig Start Date End Date Taking? Authorizing Provider  acetaminophen (TYLENOL 8 HOUR) 650 MG CR tablet Take 2 tablets (1,300 mg total) by mouth 2 (two) times daily as needed for pain. 06/24/22   Carollee Leitz, MD  albuterol (VENTOLIN HFA) 108 (90 Base) MCG/ACT inhaler Inhale 2 puffs into the lungs every 6 (six) hours as needed for wheezing or shortness of breath. 12/06/21   [name], [name], MD  aspirin EC 81 MG tablet Take 1 tablet (81 mg total) by mouth daily. 12/06/21   [name], [name], MD  Azelastine HCl 0.15 %  SOLN Place 2 sprays into both nostrils daily as needed (allergies). 12/06/21   [name], [name], MD  clobetasol cream (TEMOVATE) 6.30 % Apply 1 application. topically 2 (two) times daily. Prn left elbow 12/25/21   [name], [name], MD  Cyanocobalamin (VITAMIN B-12) 1000 MCG SUBL Place 0.001 tablets (1 mcg total) under the tongue daily. 12/06/21   [name], [name], MD  dapagliflozin propanediol (FARXIGA) 10 MG TABS tablet Take 1 tablet (10 mg total) by mouth daily before breakfast. 12/06/21   [name], [name], MD  dicyclomine (BENTYL) 10 MG capsule Take 1 capsule (10 mg total) by mouth 3 (three) times daily before meals. As needed for abdominal pain 12/06/21   [name], [name], MD  EPINEPHrine 0.3 mg/0.3 mL IJ SOAJ injection Inject one pen into the thigh as needed for anaphylactic reaction. Then CALL 911. Use second pen if needed thereafter 04/08/22   Chesley Mires, MD  esomeprazole (NEXIUM) 40 MG capsule Take 1 capsule (40 mg total) by mouth daily. 07/04/22   Carollee Leitz, MD  Evolocumab [facility] SURECLICK) 160 MG/ML SOAJ INJECT '140MG'$  SUBCUTANEOUSLY EVERY 14 DAYS 05/20/22   Gerrie Nordmann, NP  ezetimibe (ZETIA) 10 MG tablet Take 1 tablet (10 mg total) by mouth daily. 12/06/21   [name], [name], MD  fluticasone (FLONASE) 50 MCG/ACT nasal spray Place 2 sprays into both nostrils daily. Prn nasal congestion 03/20/22   [name], [name], MD  Fluticasone-Umeclidin-Vilant (TRELEGY ELLIPTA) 100-62.5-25 MCG/ACT AEPB Inhale 1 puff into the lungs daily. Rinse mouth 12/06/21   [name], [name], MD  furosemide (LASIX) 20 MG tablet TAKE 1 TABLET BY MOUTH ONCE DAILY AND 1 TABLET AT LUNCH THREE TIMES WEEKLY 06/23/22   Minna Merritts, MD  hydrOXYzine (ATARAX) 25 MG tablet Take 1 tablet (25 mg total) by mouth daily as needed for anxiety. 07/06/22   Carollee Leitz, MD  ipratropium (ATROVENT) 0.06 % nasal spray Place 2 sprays into both nostrils 4 (four) times daily.  12/06/21   [name], [name], MD  ipratropium-albuterol (DUONEB) 0.5-2.5 (3) MG/3ML SOLN USE 1 VIAL IN NEBULIZER TWICE DAILY AS NEEDED FOR WHEEZING OR SHORTNESS OF BREATH 04/15/22  [name], [name], MD  isosorbide mononitrate (IMDUR) 30 MG 24 hr tablet Take 1 tablet by mouth twice daily 02/21/22   Minna Merritts, MD  levocetirizine (XYZAL) 5 MG tablet Take 1 tablet (5 mg total) by mouth at bedtime as needed for allergies. 12/06/21   [name], [name], MD  linaclotide [facility]) 145 MCG CAPS capsule Take 1 capsule (145 mcg total) by mouth daily before breakfast. 06/24/22   Carollee Leitz, MD  meclizine (ANTIVERT) 12.5 MG tablet TAKE 1 TABLET BY MOUTH TWICE DAILY AS NEEDED FOR DIZZINESS 07/06/22   Carollee Leitz, MD  metoprolol succinate (TOPROL-XL) 50 MG 24 hr tablet TAKE 1 TABLET BY MOUTH ONCE DAILY WITH OR IMMEDIATELY FOLLOWING A MEAL 04/21/22   [name], Kathlene November, MD  montelukast (SINGULAIR) 10 MG tablet Take 1 tablet (10 mg total) by mouth at bedtime. 12/06/21   [name], [name], MD  nitroGLYCERIN (NITROSTAT) 0.4 MG SL tablet DISSOLVE ONE TABLET UNDER THE TONGUE EVERY 5 MINUTES AS NEEDED FOR CHEST PAIN.  DO NOT EXCEED A TOTAL OF 3 DOSES IN 15 MINUTES 12/06/21   [name], [name], MD  olopatadine (PATANOL) 0.1 % ophthalmic solution Place 1 drop into both eyes 2 (two) times daily. 12/06/21   [name], [name], MD  OneTouch Delica Lancets 05L MISC USE TO CHECK GLUCOSE IN THE MORNING AND AT BEDTIME 09/17/21   [name], [name], MD  [facility] VERIO test strip USE 1 STRIP TO CHECK GLUCOSE TWICE DAILY ONCE  IN  THE  MORNING  AND  ONCE  AT  BEDTIME 09/17/21   [name], [name], MD  PARoxetine (PAXIL) 20 MG tablet Take 1 tablet (20 mg total) by mouth daily. 04/29/22   [name], [name], MD  potassium chloride (KLOR-CON M) 10 MEQ tablet Take 1 tablet (10 mEq total) by mouth daily. TAKE 1 TABLET ONCE DAILY AND 1 TABLET WITH FUROSEMIDE AT LUNCH THREE TIMES A  WEEK 04/22/22   Minna Merritts, MD  pregabalin (LYRICA) 75 MG capsule Take 1 capsule (75 mg total) by mouth daily. 07/06/22   Carollee Leitz, MD  rivaroxaban Alveda [facility]asons) 2.5 MG TABS tablet Take 1 tablet by mouth twice daily 04/21/22   Minna Merritts, MD  rosuvastatin (CRESTOR) 20 MG tablet Take 1 tablet (20 mg total) by mouth at bedtime. 06/24/22   Carollee Leitz, MD  sodium chloride (OCEAN) 0.65 % SOLN nasal spray Place 2 sprays into both nostrils daily as needed for congestion. 03/20/22   [name], [name], MD  tiZANidine (ZANAFLEX) 4 MG tablet Take 1 tablet (4 mg total) by mouth every 8 (eight) hours as needed for muscle spasms. 06/24/22   Carollee Leitz, MD    Allergies as of 07/02/2022 - Review Complete 07/01/2022  Allergen Reaction Noted   Latex Rash 08/23/2010   Shellfish allergy Anaphylaxis    Sulfa antibiotics Anaphylaxis 08/23/2010   Sulfonamide derivatives Anaphylaxis 08/23/2010   Watermelon [citrullus vulgaris] Other (See Comments) 04/13/2017   Penicillins Itching 08/26/2021   Soy allergy  03/10/2019   Tomato Itching 09/11/2020   Other Swelling 01/20/2012    Family History  Problem Relation Age of Onset   Other Mother    Breast cancer Mother 58       breast cancer, late 21's   Cancer Mother        breast   Hypertension Father    Heart failure Father    Diabetes Father    Colon cancer Father 15   Glaucoma Father    Cancer  Father        colorectal    Heart disease Father    Other Father        glaucoma   Brain cancer Sister    Arthritis Sister    Diabetes Sister    Hypertension Sister    Kidney disease Sister    Diabetes Brother    Hypertension Brother    Acute myelogenous leukemia Grandson        06/2019   Coronary artery disease Neg Hx    Stroke Neg Hx     Social History   Socioeconomic History   Marital status: Legally Separated    Spouse name: Not on file   Number of children: Not on file   Years of education: Not on file   Highest education  level: Not on file  Occupational History   Not on file  Tobacco Use   Smoking status: Former    Packs/day: 2.00    Years: 38.00    Total pack years: 76.00    Types: Cigarettes    Quit date: 08/12/2010    Years since quitting: 11.9   Smokeless tobacco: Never  Vaping Use   Vaping Use: Never used  Substance and Sexual Activity   Alcohol use: No   Drug use: No    Types: Cocaine    Comment: Remote Hx (crack cocaine and marijuana).none since 59yr plus   Sexual activity: Yes  Other Topics Concern   Not on file  Social History Narrative   Caffeine: 1 cup coffee/day   Lives with family, no pets   Occupation: industrial work, 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   Edu: 11th grade   Activity: no regular exercise   Diet: good water, vegetables daily, low salt diet   Lives with sister and other family    No guns, wears seat belts, safe in relationship    2 kids    GED 1 year of college    Social Determinants of Health   Financial Resource Strain: Low Risk  (11/22/2021)   Overall Financial Resource Strain (CARDIA)    Difficulty of Paying Living Expenses: Not hard at all  Food Insecurity: No Food Insecurity (11/22/2021)   Hunger Vital Sign    Worried About Running Out of Food in the Last Year: Never true    RPocahontasin the Last Year: Never true  Transportation Needs: No Transportation Needs (11/22/2021)   PRAPARE - THydrologist(Medical): No    Lack of Transportation (Non-Medical): No  Physical Activity: Unknown (08/03/2020)   Exercise Vital Sign    Days of Exercise per Week: 0 days    Minutes of Exercise per Session: Not on file  Stress: No Stress Concern Present (08/06/2021)   [place]   Feeling of Stress : Not at all  Social Connections: Unknown (08/06/2021)   Social Connection and Isolation Panel [NHANES]    Frequency of Communication with Friends and Family: More than three times a  week    Frequency of Social Gatherings with Friends and Family: Once a week    Attends Religious Services: 1 to 4 times per year    Active Member of [employer] Organizations: Yes    Attends CArchivistMeetings: More than 4 times per year    Marital Status: Not on file  Intimate Partner Violence: Not At Risk (11/22/2021)   Humiliation, Afraid, Rape, and Kick questionnaire  Fear of Current or Ex-Partner: No    Emotionally Abused: No    Physically Abused: No    Sexually Abused: No    Review of Systems: See HPI, otherwise negative ROS  Physical Exam: BP (!) 126/102   Pulse 80   Temp (!) 97.1 F (36.2 C) (Temporal)   Resp 18   Ht '5\' 5"'$  (1.651 m)   Wt 90.3 kg   SpO2 100%   BMI 33.12 kg/m  General:   Alert,  pleasant and cooperative in NAD Head:  Normocephalic and atraumatic. Neck:  Supple; no masses or thyromegaly. Lungs:  Clear throughout to auscultation.    Heart:  Regular rate and rhythm. Abdomen:  Soft, nontender and nondistended. Normal bowel sounds, without guarding, and without rebound.   Neurologic:  Alert and  oriented x4;  grossly normal neurologically.  Impression/Plan: [name] is here for an colonoscopy to be performed for Tubular adenomas of colon   Risks, benefits, limitations, and alternatives regarding  colonoscopy have been reviewed with the patient.  Questions have been answered.  All parties agreeable.   Sherri Sear, MD  07/15/2022, 11:21 AM

## 2022-07-15 NOTE — Transfer of Care (Signed)
Immediate Anesthesia Transfer of Care Note  Patient: [name]  Procedure(s) Performed: COLONOSCOPY WITH PROPOFOL  Patient Location: PACU and Endoscopy Unit  Anesthesia Type:General  Level of Consciousness: drowsy and patient cooperative  Airway & Oxygen Therapy: Patient Spontanous Breathing  Post-op Assessment: Report given to RN and Post -op Vital signs reviewed and stable  Post vital signs: Reviewed and stable  Last Vitals:  Vitals Value Taken Time  BP 89/65 07/15/22 1227  Temp 35.8 C 07/15/22 1227  Pulse 72 07/15/22 1229  Resp 19 07/15/22 1229  SpO2 96 % 07/15/22 1229  Vitals shown include unvalidated device data.  Last Pain:  Vitals:   07/15/22 1227  TempSrc: Temporal  PainSc: 0-No pain         Complications: No notable events documented.

## 2022-07-16 ENCOUNTER — Encounter: Payer: Self-pay | Admitting: Gastroenterology

## 2022-07-16 ENCOUNTER — Telehealth: Payer: Self-pay | Admitting: Gastroenterology

## 2022-07-16 LAB — SURGICAL PATHOLOGY

## 2022-07-16 NOTE — Telephone Encounter (Signed)
Patient states the pain is a 2 out 10 on the pain scale. She states she is passing gas

## 2022-07-16 NOTE — Telephone Encounter (Signed)
Patient called office because she is having some soreness at her stomach area, it has been there off and on since the colonoscopy yesterday 07/15/2022. Patient wanted to know if she can use a heated pad.  Please advise.

## 2022-07-16 NOTE — Telephone Encounter (Signed)
Caryl Pina, please call patient and check on her about abdominal pain.  If it is mild and she is passing gas or having bowel movements, she can use heating pad  Thanks  RV

## 2022-07-24 ENCOUNTER — Ambulatory Visit: Payer: Medicare HMO

## 2022-07-24 DIAGNOSIS — I259 Chronic ischemic heart disease, unspecified: Secondary | ICD-10-CM | POA: Diagnosis not present

## 2022-07-24 DIAGNOSIS — G4733 Obstructive sleep apnea (adult) (pediatric): Secondary | ICD-10-CM | POA: Diagnosis not present

## 2022-07-24 DIAGNOSIS — G4710 Hypersomnia, unspecified: Secondary | ICD-10-CM | POA: Diagnosis not present

## 2022-07-25 ENCOUNTER — Other Ambulatory Visit: Payer: Self-pay | Admitting: Cardiovascular Disease

## 2022-07-28 DIAGNOSIS — G4733 Obstructive sleep apnea (adult) (pediatric): Secondary | ICD-10-CM | POA: Diagnosis not present

## 2022-07-28 DIAGNOSIS — I259 Chronic ischemic heart disease, unspecified: Secondary | ICD-10-CM | POA: Diagnosis not present

## 2022-07-28 DIAGNOSIS — G4710 Hypersomnia, unspecified: Secondary | ICD-10-CM | POA: Diagnosis not present

## 2022-08-01 ENCOUNTER — Other Ambulatory Visit: Payer: Medicare HMO

## 2022-08-08 ENCOUNTER — Encounter
Admission: RE | Admit: 2022-08-08 | Discharge: 2022-08-08 | Disposition: A | Discharge status: Home / Self Care | Payer: Medicare HMO | Source: Ambulatory Visit | Attending: Gastroenterology | Admitting: Gastroenterology

## 2022-08-08 DIAGNOSIS — R1013 Epigastric pain: Secondary | ICD-10-CM | POA: Diagnosis not present

## 2022-08-08 DIAGNOSIS — K30 Functional dyspepsia: Secondary | ICD-10-CM | POA: Diagnosis not present

## 2022-08-08 DIAGNOSIS — Z0389 Encounter for observation for other suspected diseases and conditions ruled out: Secondary | ICD-10-CM | POA: Diagnosis not present

## 2022-08-08 MED ORDER — TECHNETIUM TC 99M SULFUR COLLOID
2.3500 | Freq: Once | INTRAVENOUS | Status: AC | PRN
  Administered 2022-08-08: 2.35 via INTRAVENOUS

## 2022-08-10 ENCOUNTER — Other Ambulatory Visit: Payer: Self-pay | Admitting: Cardiology

## 2022-08-12 ENCOUNTER — Other Ambulatory Visit: Payer: Self-pay | Admitting: Family Medicine

## 2022-08-12 DIAGNOSIS — H579 Unspecified disorder of eye and adnexa: Secondary | ICD-10-CM

## 2022-08-14 ENCOUNTER — Telehealth: Payer: Self-pay

## 2022-08-14 NOTE — Telephone Encounter (Signed)
Called and left a message for call back  

## 2022-08-14 NOTE — Telephone Encounter (Signed)
Patient verbalized understanding of results  

## 2022-08-14 NOTE — Telephone Encounter (Signed)
-----  Message from Lin Landsman, MD sent at 08/14/2022  1:06 PM EST ----- Please inform patient that her gastric emptying study came back normal  RV

## 2022-08-15 ENCOUNTER — Ambulatory Visit (INDEPENDENT_AMBULATORY_CARE_PROVIDER_SITE_OTHER): Payer: Medicare HMO

## 2022-08-15 VITALS — Ht 65.0 in | Wt 199.0 lb

## 2022-08-15 DIAGNOSIS — Z0000 Encounter for general adult medical examination without abnormal findings: Secondary | ICD-10-CM | POA: Diagnosis not present

## 2022-08-15 NOTE — Patient Instructions (Addendum)
[name] , Thank you for taking time to come for your Medicare Wellness Visit. I appreciate your ongoing commitment to your health goals. Please review the following plan we discussed and let me know if I can assist you in the future.   These are the goals we discussed:  Goals Addressed             This Visit's Progress    Low carb/cholesterol diet           This is a list of the screening recommended for you and due dates:  Health Maintenance  Topic Date Due   Complete foot exam   Never done   Yearly kidney health urinalysis for diabetes  07/24/2022   COVID-19 Vaccine (3 - Pfizer risk series) 08/31/2022*   Zoster (Shingles) Vaccine (1 of 2) 11/14/2022*   Hemoglobin A1C  12/31/2022   Screening for Lung Cancer  03/12/2023   Eye exam for diabetics  03/22/2023   Yearly kidney function blood test for diabetes  07/02/2023   Medicare Annual Wellness Visit  08/16/2023   Mammogram  11/09/2023   Colon Cancer Screening  07/15/2025   DTaP/Tdap/Td vaccine (2 - Td or Tdap) 05/04/2029   Flu Shot  Completed   Hepatitis C Screening: USPSTF Recommendation to screen - Ages 18-79 yo.  Completed   HIV Screening  Completed   HPV Vaccine  Aged Out   Pap Smear  Discontinued  *Topic was postponed. The date shown is not the original due date.    Advanced directives: on file  Conditions/risks identified: none new  Next appointment: Follow up in one year for your annual wellness visit.   Preventive Care 40-64 Years, Female Preventive care refers to lifestyle choices and visits with your health care provider that can promote health and wellness. What does preventive care include? A yearly physical exam. This is also called an annual well check. Dental exams once or twice a year. Routine eye exams. Ask your health care provider how often you should have your eyes checked. Personal lifestyle choices, including: Daily care of your teeth and gums. Regular physical activity. Eating a healthy  diet. Avoiding tobacco and drug use. Limiting alcohol use. Practicing safe sex. Taking low-dose aspirin daily starting at age 86. Taking vitamin and mineral supplements as recommended by your health care provider. What happens during an annual well check? The services and screenings done by your health care provider during your annual well check will depend on your age, overall health, lifestyle risk factors, and family history of disease. Counseling  Your health care provider may ask you questions about your: Alcohol use. Tobacco use. Drug use. Emotional well-being. Home and relationship well-being. Sexual activity. Eating habits. Work and work Statistician. Method of birth control. Menstrual cycle. Pregnancy history. Screening  You may have the following tests or measurements: Height, weight, and BMI. Blood pressure. Lipid and cholesterol levels. These may be checked every 5 years, or more frequently if you are over 31 years old. Skin check. Lung cancer screening. You may have this screening every year starting at age 62 if you have a 30-pack-year history of smoking and currently smoke or have quit within the past 15 years. Fecal occult blood test (FOBT) of the stool. You may have this test every year starting at age 39. Flexible sigmoidoscopy or colonoscopy. You may have a sigmoidoscopy every 5 years or a colonoscopy every 10 years starting at age 67. Hepatitis C blood test. Hepatitis B blood test. Sexually transmitted  disease (STD) testing. Diabetes screening. This is done by checking your blood sugar (glucose) after you have not eaten for a while (fasting). You may have this done every 1-3 years. Mammogram. This may be done every 1-2 years. Talk to your health care provider about when you should start having regular mammograms. This may depend on whether you have a family history of breast cancer. BRCA-related cancer screening. This may be done if you have a family history of  breast, ovarian, tubal, or peritoneal cancers. Pelvic exam and Pap test. This may be done every 3 years starting at age 17. Starting at age 65, this may be done every 5 years if you have a Pap test in combination with an HPV test. Bone density scan. This is done to screen for osteoporosis. You may have this scan if you are at high risk for osteoporosis. Discuss your test results, treatment options, and if necessary, the need for more tests with your health care provider. Vaccines  Your health care provider may recommend certain vaccines, such as: Influenza vaccine. This is recommended every year. Tetanus, diphtheria, and acellular pertussis (Tdap, Td) vaccine. You may need a Td booster every 10 years. Zoster vaccine. You may need this after age 57. Pneumococcal 13-valent conjugate (PCV13) vaccine. You may need this if you have certain conditions and were not previously vaccinated. Pneumococcal polysaccharide (PPSV23) vaccine. You may need one or two doses if you smoke cigarettes or if you have certain conditions. Talk to your health care provider about which screenings and vaccines you need and how often you need them. This information is not intended to replace advice given to you by your health care provider. Make sure you discuss any questions you have with your health care provider. Document Released: 08/10/2015 Document Revised: 04/02/2016 Document Reviewed: 05/15/2015 Elsevier Interactive Patient Education  2017 [employer] Prevention in the Home Falls can cause injuries. They can happen to people of all ages. There are many things you can do to make your home safe and to help prevent falls. What can I do on the outside of my home? Regularly fix the edges of walkways and driveways and fix any cracks. Remove anything that might make you trip as you walk through a door, such as a raised step or threshold. Trim any bushes or trees on the path to your home. Use bright outdoor  lighting. Clear any walking paths of anything that might make someone trip, such as rocks or tools. Regularly check to see if handrails are loose or broken. Make sure that both sides of any steps have handrails. Any raised decks and porches should have guardrails on the edges. Have any leaves, snow, or ice cleared regularly. Use sand or salt on walking paths during winter. Clean up any spills in your garage right away. This includes oil or grease spills. What can I do in the bathroom? Use night lights. Install grab bars by the toilet and in the tub and shower. Do not use towel bars as grab bars. Use non-skid mats or decals in the tub or shower. If you need to sit down in the shower, use a plastic, non-slip stool. Keep the floor dry. Clean up any water that spills on the floor as soon as it happens. Remove soap buildup in the tub or shower regularly. Attach bath mats securely with double-sided non-slip rug tape. Do not have throw rugs and other things on the floor that can make you trip. What can  I do in the bedroom? Use night lights. Make sure that you have a light by your bed that is easy to reach. Do not use any sheets or blankets that are too big for your bed. They should not hang down onto the floor. Have a firm chair that has side arms. You can use this for support while you get dressed. Do not have throw rugs and other things on the floor that can make you trip. What can I do in the kitchen? Clean up any spills right away. Avoid walking on wet floors. Keep items that you use a lot in easy-to-reach places. If you need to reach something above you, use a strong step stool that has a grab bar. Keep electrical cords out of the way. Do not use floor polish or wax that makes floors slippery. If you must use wax, use non-skid floor wax. Do not have throw rugs and other things on the floor that can make you trip. What can I do with my stairs? Do not leave any items on the stairs. Make  sure that there are handrails on both sides of the stairs and use them. Fix handrails that are broken or loose. Make sure that handrails are as long as the stairways. Check any carpeting to make sure that it is firmly attached to the stairs. Fix any carpet that is loose or worn. Avoid having throw rugs at the top or bottom of the stairs. If you do have throw rugs, attach them to the floor with carpet tape. Make sure that you have a light switch at the top of the stairs and the bottom of the stairs. If you do not have them, ask someone to add them for you. What else can I do to help prevent falls? Wear shoes that: Do not have high heels. Have rubber bottoms. Are comfortable and fit you well. Are closed at the toe. Do not wear sandals. If you use a stepladder: Make sure that it is fully opened. Do not climb a closed stepladder. Make sure that both sides of the stepladder are locked into place. Ask someone to hold it for you, if possible. Clearly mark and make sure that you can see: Any grab bars or handrails. First and last steps. Where the edge of each step is. Use tools that help you move around (mobility aids) if they are needed. These include: Canes. Walkers. Scooters. Crutches. Turn on the lights when you go into a dark area. Replace any light bulbs as soon as they burn out. Set up your furniture so you have a clear path. Avoid moving your furniture around. If any of your floors are uneven, fix them. If there are any pets around you, be aware of where they are. Review your medicines with your doctor. Some medicines can make you feel dizzy. This can increase your chance of falling. Ask your doctor what other things that you can do to help prevent falls. This information is not intended to replace advice given to you by your health care provider. Make sure you discuss any questions you have with your health care provider. Document Released: 05/10/2009 Document Revised: 12/20/2015  Document Reviewed: 08/18/2014 Elsevier Interactive Patient Education  2017 [employer].

## 2022-08-15 NOTE — Progress Notes (Signed)
Subjective:   Riverlyn Kizziah is a 62 y.o. female who presents for Medicare Annual (Subsequent) preventive examination.  Review of Systems    No ROS.  Medicare Wellness Virtual Visit.  Visual/audio telehealth visit, UTA vital signs.   See social history for additional risk factors.   Cardiac Risk Factors include: advanced age (>77mn, >>53women)     Objective:    Today's Vitals   08/15/22 1506  Weight: 199 lb (90.3 kg)  Height: '5\' 5"'$  (1.651 m)   Body mass index is 33.12 kg/m.     08/15/2022    3:11 PM 07/15/2022   11:23 AM 03/11/2022    1:21 PM 01/23/2022    9:34 AM 11/22/2021    9:32 AM 08/06/2021   11:31 AM 09/11/2020   11:04 AM  Advanced Directives  Does Patient Have a Medical Advance Directive? Yes No No No Yes Yes No  Type of AParamedicof ACitronelleLiving will    H[place]ungnakLiving will HHoward CityLiving will   Does patient want to make changes to medical advance directive? No - Patient declined    No - Patient declined No - Patient declined   Copy of HTrionin Chart? Yes - validated most recent copy scanned in chart (See row information)    Yes - validated most recent copy scanned in chart (See row information) Yes - validated most recent copy scanned in chart (See row information)   Would patient like information on creating a medical advance directive?    Yes (MAU/Ambulatory/Procedural Areas - Information given)   Yes (MAU/Ambulatory/Procedural Areas - Information given)    Current Medications (verified) Outpatient Encounter Medications as of 08/15/2022  Medication Sig   acetaminophen (TYLENOL 8 HOUR) 650 MG CR tablet Take 2 tablets (1,300 mg total) by mouth 2 (two) times daily as needed for pain.   albuterol (VENTOLIN HFA) 108 (90 Base) MCG/ACT inhaler Inhale 2 puffs into the lungs every 6 (six) hours as needed for wheezing or shortness of breath.   aspirin EC 81 MG tablet Take 1 tablet  (81 mg total) by mouth daily.   Azelastine HCl 0.15 % SOLN Place 2 sprays into both nostrils daily as needed (allergies).   clobetasol cream (TEMOVATE) 09.93% Apply 1 application. topically 2 (two) times daily. Prn left elbow   Cyanocobalamin (VITAMIN B-12) 1000 MCG SUBL Place 0.001 tablets (1 mcg total) under the tongue daily.   dapagliflozin propanediol (FARXIGA) 10 MG TABS tablet Take 1 tablet (10 mg total) by mouth daily before breakfast.   dicyclomine (BENTYL) 10 MG capsule Take 1 capsule (10 mg total) by mouth 3 (three) times daily before meals. As needed for abdominal pain   EPINEPHrine 0.3 mg/0.3 mL IJ SOAJ injection Inject one pen into the thigh as needed for anaphylactic reaction. Then CALL 911. Use second pen if needed thereafter   esomeprazole (NEXIUM) 40 MG capsule Take 1 capsule (40 mg total) by mouth daily.   Evolocumab (REPATHA SURECLICK) 1570MG/ML SOAJ INJECT 1 DOSE SUBCUTANEOUSLY EVERY 14 DAYS   ezetimibe (ZETIA) 10 MG tablet Take 1 tablet (10 mg total) by mouth daily.   fluticasone (FLONASE) 50 MCG/ACT nasal spray Place 2 sprays into both nostrils daily. Prn nasal congestion   Fluticasone-Umeclidin-Vilant (TRELEGY ELLIPTA) 100-62.5-25 MCG/ACT AEPB Inhale 1 puff into the lungs daily. Rinse mouth   furosemide (LASIX) 20 MG tablet TAKE 1 TABLET BY MOUTH ONCE DAILY AND 1 TABLET AT LUNCH THREE TIMES  WEEKLY   hydrOXYzine (ATARAX) 25 MG tablet Take 1 tablet (25 mg total) by mouth daily as needed for anxiety.   ipratropium (ATROVENT) 0.06 % nasal spray Place 2 sprays into both nostrils 4 (four) times daily.   ipratropium-albuterol (DUONEB) 0.5-2.5 (3) MG/3ML SOLN USE 1 VIAL IN NEBULIZER TWICE DAILY AS NEEDED FOR WHEEZING OR SHORTNESS OF BREATH   isosorbide mononitrate (IMDUR) 30 MG 24 hr tablet Take 1 tablet by mouth twice daily   levocetirizine (XYZAL) 5 MG tablet Take 1 tablet (5 mg total) by mouth at bedtime as needed for allergies.   linaclotide (LINZESS) 145 MCG CAPS capsule Take 1  capsule (145 mcg total) by mouth daily before breakfast.   meclizine (ANTIVERT) 12.5 MG tablet TAKE 1 TABLET BY MOUTH TWICE DAILY AS NEEDED FOR DIZZINESS   metoprolol succinate (TOPROL-XL) 50 MG 24 hr tablet TAKE 1 TABLET BY MOUTH ONCE DAILY WITH OR IMMEDIATELY FOLLOWING A MEAL   montelukast (SINGULAIR) 10 MG tablet Take 1 tablet (10 mg total) by mouth at bedtime.   nitroGLYCERIN (NITROSTAT) 0.4 MG SL tablet DISSOLVE ONE TABLET UNDER THE TONGUE EVERY 5 MINUTES AS NEEDED FOR CHEST PAIN.  DO NOT EXCEED A TOTAL OF 3 DOSES IN 15 MINUTES   olopatadine (PATANOL) 0.1 % ophthalmic solution INSTILL 1 DROP INTO EACH EYE TWICE DAILY   OneTouch Delica Lancets 11H MISC USE TO CHECK GLUCOSE IN THE MORNING AND AT BEDTIME   ONETOUCH VERIO test strip USE 1 STRIP TO CHECK GLUCOSE TWICE DAILY ONCE  IN  THE  MORNING  AND  ONCE  AT  BEDTIME   PARoxetine (PAXIL) 20 MG tablet Take 1 tablet (20 mg total) by mouth daily.   potassium chloride (KLOR-CON M) 10 MEQ tablet Take 1 tablet (10 mEq total) by mouth daily. TAKE 1 TABLET ONCE DAILY AND 1 TABLET WITH FUROSEMIDE AT LUNCH THREE TIMES A WEEK   pregabalin (LYRICA) 75 MG capsule Take 1 capsule (75 mg total) by mouth daily.   rivaroxaban (XARELTO) 2.5 MG TABS tablet Take 1 tablet by mouth twice daily   rosuvastatin (CRESTOR) 20 MG tablet Take 1 tablet (20 mg total) by mouth at bedtime.   sodium chloride (OCEAN) 0.65 % SOLN nasal spray Place 2 sprays into both nostrils daily as needed for congestion.   tiZANidine (ZANAFLEX) 4 MG tablet Take 1 tablet (4 mg total) by mouth every 8 (eight) hours as needed for muscle spasms.   No facility-administered encounter medications on file as of 08/15/2022.    Allergies (verified) Latex, Shellfish allergy, Sulfa antibiotics, Sulfonamide derivatives, Watermelon [citrullus vulgaris], Penicillins, Soy allergy, Tomato, and Other   History: Past Medical History:  Diagnosis Date   Abnormal feces    Abnormality of both breasts on  screening mammogram 05/14/2020   Acute esophagogastric ulcer    Allergy    Anemia    Annual physical exam 01/14/2021   Anxiety and depression 04/05/2018   Arthritis    Asthma    Atypical chest pain 01/20/2014   Back ache 06/19/2015   Bacterial vaginitis    Benign breast cyst in female, right 02/22/2020   Benign neoplasm of ascending colon    Bilateral hip pain 04/19/2020   Blood in stool    Brachial neuritis or radiculitis NOS    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 being followed by neurosurgery/eye doctor and pcp. monitoring size.causes sinus problems   Breast mass, right 01/14/2021   Bronchitis    Bunion of right foot  08/26/2018   Cardiac arrhythmia    CCF (congestive cardiac failure) ([place]) 06/19/2015   Cervical neck pain with evidence of disc disease    C5/6 disease, MRI done late 2012 - no records available   Chronic combined systolic and diastolic CHF (congestive heart failure) ([place])    a. 08/2010 Echo: mildly reduced EF 40-45%, mild diffuse hypokinesis; b. 06/2016 Echo: EF 50%, no rwma, mild to mod TR; c. 03/2017 Echo: EF 40-45%, Gr1 DD (prior echo reviewed and EF felt to be lower than reported).   Chronic neck pain 12/18/2017   Chronic pain    Chronic pain of inguinal region 07/24/2021   CLAUDICATION 08/23/2010   Qualifier: Diagnosis of   By: Tamala Julian RN, Megan       Cocaine abuse, in remission ([place])    clean x 24 years   Colon polyp 06/19/2015   2010   Colon polyps    COPD (chronic obstructive pulmonary disease) ([place])    a. 12/2018 PFT: No obvious obst/restrictive dzs.   COPD exacerbation ([place]) 03/12/2022   Coronary artery disease    a. PCI of LCX 2003; b. PCI of the LAD 2012 with a (2.5 x 8 mm BMS);  c.s/p CABG 4/12: L-LAD, VG-Dx, VG-OM, VG-RCA (Dr. Prescott Gum);  d. 01/2012 MV: inf infarct, attenuation, no ischemia; e. 02/2017 MV: signifi attenuation artifact. Fixed basal antlat/inflat scar vs artifact. Reversible apical lat and mid antlat defect - ? atten  vs ischemia. F/u echo w/o wma->Med rx.   Coronary artery disease involving native coronary artery of native heart without angina pectoris 08/23/2010   Depression    Depression    Diabetes mellitus without complication [facility])    Fall 05/14/2018   Family history of colon cancer    Family history of colon cancer 06/19/2015   Gastritis without bleeding    Generalized headaches    frequent   GERD (gastroesophageal reflux disease)    Headache    Heart murmur    Hematochezia    Hematochezia 01/11/2015   Hepatitis    history of hepatitis b   History of cervical cancer    s/p cryotherapy   History of cervical cancer    s/p cryotherapy   History of colonic polyps 01/11/2015   History of drug abuse ([place])    cocaine, marijuana, clean since 1989   History of drug abuse ([place])    cocaine, marijuana, clean since 1989   History of hepatitis B    from eating undercooked liver   History of MI (myocardial infarction)    HTN (hypertension) 12/20/2011   Hyperlipidemia    Hypertension    Insomnia 04/19/2020   Intractable vomiting with nausea    Intractable vomiting with nausea    Ischemic cardiomyopathy    a. 03/2017 Echo: EF 40-45%, Gr1 DD.   Lipoma of right lower extremity 08/30/2020   Mild intermittent asthma 10/13/2019   Myocardial infarction [facility]) 2003, 2012   Need for pneumococcal 20-valent conjugate vaccination 07/06/2022   Numbness and tingling in right hand 02/24/2018   Orthostatic hypotension 04/30/2015   PAD (peripheral artery disease) ([place])    a. s/p Right SFA atherectomy and PTA 01/15/11; b. 07/2018 ABI: R 1.02, L 1.09.   Pituitary mass ([place])    a. 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 Mass abuts R cavernous sinus w/o definite invasion.   PND (post-nasal drip) 07/24/2021   Polyp of colon    Pruritus of both eyes 09/21/2020   Right  knee pain 12/20/2011   Abnormal MRI right knee 08/29/2016 f/u [place] [facility]      FINDINGS: Medial compartment: [There is abnormal  linear signal within the medial meniscus near junction of posterior horn and mid body which abuts the tibial undersurface consistent with nondisplaced tear (5:8-10). The articular cartilage, and subchondral bone are normal. 3 mm bone island (7:14).  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   Seasonal allergies    Shortness of breath 08/23/2010   Qualifier: Diagnosis of   By: Tamala Julian RN, Megan       Skin lesion 04/19/2021   Sleep apnea    uses cpap   Smoking history    quit 07/2010   Smoking history 12/16/2010   Quit 07/2010   Status post total knee replacement using cement, right 04/14/2019   Thrush 04/19/2021   Thyroid disease    Urinary incontinence    Urinary incontinence 12/18/2017   Vaginal itching 04/19/2021   Vasomotor flushing 11/24/2016   Past Surgical History:  Procedure Laterality Date   ABDOMINAL HYSTERECTOMY     2003   CHOLECYSTECTOMY  1986   COLONOSCOPY  2008   3 polyps   COLONOSCOPY WITH PROPOFOL N/A 02/06/2015   Procedure: COLONOSCOPY WITH PROPOFOL;  Surgeon: Lucilla Lame, MD;  Location: [facility] ENDOSCOPY;  Service: Endoscopy;  Laterality: N/A;   COLONOSCOPY WITH PROPOFOL N/A 01/27/2017   Procedure: COLONOSCOPY WITH PROPOFOL;  Surgeon: Lucilla Lame, MD;  Location: [facility] ENDOSCOPY;  Service: Endoscopy;  Laterality: N/A;   COLONOSCOPY WITH PROPOFOL N/A 07/15/2022   Procedure: COLONOSCOPY WITH PROPOFOL;  Surgeon: Lin Landsman, MD;  Location: [facility] ENDOSCOPY;  Service: Gastroenterology;  Laterality: N/A;   CORONARY ANGIOPLASTY     w/ stent placement x2   CORONARY ARTERY BYPASS GRAFT  2012   Dr [name]   CORONARY STENT PLACEMENT  2003   S/P MI   CORONARY STENT PLACEMENT  2007   [place]   ESOPHAGOGASTRODUODENOSCOPY (EGD) WITH PROPOFOL N/A 02/06/2015   Procedure: ESOPHAGOGASTRODUODENOSCOPY (EGD) WITH PROPOFOL;  Surgeon: Lucilla Lame, MD;  Location: [facility] ENDOSCOPY;  Service: Endoscopy;  Laterality: N/A;   ESOPHAGOGASTRODUODENOSCOPY (EGD)  WITH PROPOFOL N/A 01/27/2017   Procedure: ESOPHAGOGASTRODUODENOSCOPY (EGD) WITH PROPOFOL;  Surgeon: Lucilla Lame, MD;  Location: [facility] ENDOSCOPY;  Service: Endoscopy;  Laterality: N/A;   ESOPHAGOGASTRODUODENOSCOPY (EGD) WITH PROPOFOL N/A 09/01/2019   Procedure: ESOPHAGOGASTRODUODENOSCOPY (EGD) WITH PROPOFOL;  Surgeon: Lin Landsman, MD;  Location: [place];  Service: Gastroenterology;  Laterality: N/A;   FEMORAL ARTERY STENT  10/2010   right sided (Dr. Burt Knack)   KNEE ARTHROSCOPY WITH MEDIAL MENISECTOMY Right 08/20/2017   Procedure: KNEE ARTHROSCOPY WITH MEDIAL  AND LATERAL MENISECTOMY;  Surgeon: Hessie Knows, MD;  Location: [facility] ORS;  Service: Orthopedics;  Laterality: Right;   POSTERIOR CERVICAL LAMINECTOMY N/A 03/08/2018   Procedure: POSTERIOR CERVICAL LAMINECTOMY-C7;  Surgeon: Deetta Perla, MD;  Location: [facility] ORS;  Service: Neurosurgery;  Laterality: N/A;   TONSILLECTOMY     TOTAL KNEE ARTHROPLASTY Right 04/14/2019   Procedure: RIGHT TOTAL KNEE ARTHROPLASTY;  Surgeon: Hessie Knows, MD;  Location: [facility] ORS;  Service: Orthopedics;  Laterality: Right;   TUBAL LIGATION     Family History  Problem Relation Age of Onset   Other Mother    Breast cancer Mother 30       breast cancer, late 59's   Cancer Mother        breast   Hypertension Father    Heart failure Father  Diabetes Father    Colon cancer Father 63   Glaucoma Father    Cancer Father        colorectal    Heart disease Father    Other Father        glaucoma   Brain cancer Sister    Arthritis Sister    Diabetes Sister    Hypertension Sister    Kidney disease Sister    Diabetes Brother    Hypertension Brother    Acute myelogenous leukemia Grandson        06/2019   Coronary artery disease Neg Hx    Stroke Neg Hx    Social History   Socioeconomic History   Marital status: Legally Separated    Spouse name: Not on file   Number of children: Not on file   Years of education: Not on file   Highest education  level: Not on file  Occupational History   Not on file  Tobacco Use   Smoking status: Former    Packs/day: 2.00    Years: 38.00    Total pack years: 76.00    Types: Cigarettes    Quit date: 08/12/2010    Years since quitting: 12.0   Smokeless tobacco: Never  Vaping Use   Vaping Use: Never used  Substance and Sexual Activity   Alcohol use: No   Drug use: No    Types: Cocaine    Comment: Remote Hx (crack cocaine and marijuana).none since 72yr plus   Sexual activity: Yes  Other Topics Concern   Not on file  Social History Narrative   Caffeine: 1 cup coffee/day   Lives with family, no pets   Occupation: industrial work, 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   Edu: 11th grade   Activity: no regular exercise   Diet: good water, vegetables daily, low salt diet   Lives with sister and other family    No guns, wears seat belts, safe in relationship    2 kids    GED 1 year of college    Social Determinants of Health   Financial Resource Strain: Low Risk  (08/15/2022)   Overall Financial Resource Strain (CARDIA)    Difficulty of Paying Living Expenses: Not hard at all  Food Insecurity: No Food Insecurity (08/15/2022)   Hunger Vital Sign    Worried About Running Out of Food in the Last Year: Never true    RCitrus Springsin the Last Year: Never true  Transportation Needs: No Transportation Needs (08/15/2022)   PRAPARE - THydrologist(Medical): No    Lack of Transportation (Non-Medical): No  Physical Activity: Unknown (08/03/2020)   Exercise Vital Sign    Days of Exercise per Week: 0 days    Minutes of Exercise per Session: Not on file  Stress: No Stress Concern Present (08/15/2022)   [place]   Feeling of Stress : Not at all  Social Connections: Unknown (08/15/2022)   Social Connection and Isolation Panel [NHANES]    Frequency of Communication with Friends and Family: More than three times a  week    Frequency of Social Gatherings with Friends and Family: Once a week    Attends Religious Services: 1 to 4 times per year    Active Member of [employer] Organizations: Yes    Attends CArchivistMeetings: More than 4 times per year    Marital Status: Not on file  Tobacco Counseling Counseling given: Not Answered   Clinical Intake:  Pre-visit preparation completed: Yes        Diabetes: No  How often do you need to have someone help you when you read instructions, pamphlets, or other written materials from your doctor or pharmacy?: 1 - Never    Interpreter Needed?: No      Activities of Daily Living    08/15/2022    3:13 PM  In your present state of health, do you have any difficulty performing the following activities:  Hearing? 0  Vision? 0  Difficulty concentrating or making decisions? 0  Walking or climbing stairs? 0  Dressing or bathing? 0  Doing errands, shopping? 0  Preparing Food and eating ? N  Using the Toilet? N  In the past six months, have you accidently leaked urine? Y  Comment Self managed  Do you have problems with loss of bowel control? N  Managing your Medications? N  Managing your Finances? N  Housekeeping or managing your Housekeeping? N    Patient Care Team: Carollee Leitz, MD as PCP - General (Family Medicine) [name] Kathlene November, MD as PCP - Cardiology (Cardiology) Dannielle Karvonen, RN as [place] any recent [place] you may have received from other than [facility] providers in the past year (date may be approximate).     Assessment:   This is a routine wellness examination for [facility].  I connected with  Zachary George on 08/15/22 by a audio enabled telemedicine application and verified that I am speaking with the correct person using two identifiers.  Patient Location: Home  Provider Location: Office/Clinic  I discussed the limitations of evaluation and management by  telemedicine. The patient expressed understanding and agreed to proceed.   Hearing/Vision screen Hearing Screening - Comments:: Patient is able to hear conversational tones without difficulty. No issues reported. Vision Screening - Comments:: Followed by [facility], Dr George Ina Wears corrective lenses They have seen their ophthalmologist.    Dietary issues and exercise activities discussed: Current Exercise Habits: Home exercise routine, Intensity: Mild   Goals Addressed             This Visit's Progress    Low carb/cholesterol diet         Depression Screen    08/15/2022    3:16 PM 06/24/2022   11:04 AM 03/20/2022   10:17 AM 12/25/2021    1:15 PM 12/06/2021   11:23 AM 11/22/2021    9:51 AM 08/06/2021   11:29 AM  PHQ 2/9 Scores  PHQ - 2 Score 0 '5  1 1 '$ 0 0  PHQ- 9 Score 0 13       Exception Documentation   Patient refusal        Fall Risk    08/15/2022    3:13 PM 06/24/2022   10:56 AM 12/25/2021    1:15 PM 12/06/2021   11:23 AM 11/22/2021    9:50 AM  Fall Risk   Falls in the past year? 0 0 0 0 0  Number falls in past yr: 0 0 0 0 0  Injury with Fall? 0 0 0 0 0  Risk for fall due to :  No Fall Risks No Fall Risks No Fall Risks Medication side effect;Impaired vision  Follow up Falls evaluation completed;Falls prevention discussed Falls evaluation completed Falls evaluation completed Falls evaluation completed Falls evaluation completed;Education provided;Falls prevention discussed    FALL RISK PREVENTION PERTAINING TO THE HOME: Home  free of loose throw rugs in walkways, pet beds, electrical cords, etc? Yes  Adequate lighting in your home to reduce risk of falls? Yes   ASSISTIVE DEVICES UTILIZED TO PREVENT FALLS: Life alert? No  Use of a cane, walker or w/c? Yes , cane as needed  TIMED UP AND GO: Was the test performed? No .    Cognitive Function:        08/15/2022    3:24 PM 08/06/2021   11:40 AM 08/03/2020    1:51 PM 08/03/2019    1:29 PM  6CIT Screen  What  Year? 0 points 0 points 0 points 0 points  What month? 0 points 0 points 0 points 0 points  What time? 0 points 0 points 0 points 0 points  Count back from 20 0 points 0 points 0 points 0 points  Months in reverse 0 points 0 points 4 points 0 points  Repeat phrase 0 points 0 points 4 points 0 points  Total Score 0 points 0 points 8 points 0 points    Immunizations Immunization History  Administered Date(s) Administered   Influenza Split 05/22/2015, 04/21/2016   Influenza,inj,Quad PF,6+ Mos 04/05/2018, 03/29/2019, 04/19/2020, 04/19/2021   Influenza-Unspecified 05/20/2017, 05/27/2022   PFIZER(Purple Top)SARS-COV-2 Vaccination 01/03/2020, 01/24/2020   PNEUMOCOCCAL CONJUGATE-20 06/24/2022   RSV,unspecified 05/27/2022   Tdap 05/05/2019   Shingrix Completed?: No.    Education has been provided regarding the importance of this vaccine. Patient has been advised to call insurance company to determine out of pocket expense if they have not yet received this vaccine. Advised may also receive vaccine at local pharmacy or Health Dept. Verbalized acceptance and understanding.  Screening Tests Health Maintenance  Topic Date Due   FOOT EXAM  Never done   Diabetic kidney evaluation - Urine ACR  07/24/2022   COVID-19 Vaccine (3 - Pfizer risk series) 08/31/2022 (Originally 02/21/2020)   Zoster Vaccines- Shingrix (1 of 2) 11/14/2022 (Originally 08/14/1979)   HEMOGLOBIN A1C  12/31/2022   Lung Cancer Screening  03/12/2023   OPHTHALMOLOGY EXAM  03/22/2023   Diabetic kidney evaluation - eGFR measurement  07/02/2023   Medicare Annual Wellness (AWV)  08/16/2023   MAMMOGRAM  11/09/2023   COLONOSCOPY (Pts 45-20yr Insurance coverage will need to be confirmed)  07/15/2025   DTaP/Tdap/Td (2 - Td or Tdap) 05/04/2029   INFLUENZA VACCINE  Completed   Hepatitis C Screening  Completed   HIV Screening  Completed   HPV VACCINES  Aged Out   PAP SMEAR-Modifier  Discontinued    Health Maintenance Health  Maintenance Due  Topic Date Due   FOOT EXAM  Never done   Diabetic kidney evaluation - Urine ACR  07/24/2022   Mammogram- ordered. Agrees to schedule.   DG Chest 2 View: completed 03/11/22.  Hepatitis C Screening: Completed 12/2015.  Vision Screening: Recommended annual ophthalmology exams for early detection of glaucoma and other disorders of the eye.  Dental Screening: Recommended annual dental exams for proper oral hygiene  Community Resource Referral / Chronic Care Management: CRR required this visit?  No   CCM required this visit?  No      Plan:     I have personally reviewed and noted the following in the patient's chart:   Medical and social history Use of alcohol, tobacco or illicit drugs  Current medications and supplements including opioid prescriptions. Patient is not currently taking opioid prescriptions. Functional ability and status Nutritional status Physical activity Advanced directives List of other physicians Hospitalizations, surgeries, and ER  visits in previous 12 months Vitals Screenings to include cognitive, depression, and falls Referrals and appointments  In addition, I have reviewed and discussed with patient certain preventive protocols, quality metrics, and best practice recommendations. A written personalized care plan for preventive services as well as general preventive health recommendations were provided to patient.     Leta Jungling, LPN   11/26/5866

## 2022-08-17 ENCOUNTER — Other Ambulatory Visit: Payer: Self-pay | Admitting: Family Medicine

## 2022-08-17 DIAGNOSIS — K5909 Other constipation: Secondary | ICD-10-CM

## 2022-08-18 NOTE — Progress Notes (Signed)
Cardiology Clinic Note   Patient Name: [name] Date of Encounter: 08/19/2022  Primary Care Provider:  Carollee Leitz, MD Primary Cardiologist:  Ida Rogue, MD  Patient Profile    62 year old female with a history of CAD status post CABG, chronic systolic and diastolic congestive heart failure, ischemic cardiomyopathy, hypertension, hyperlipidemia, diabetes, COPD, and peripheral arterial disease, and sleep apnea who presents today to follow-up for CAD and combined chronic systolic and diastolic congestive heart failure.  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 being followed by neurosurgery/eye doctor and pcp. monitoring size.causes sinus problems   Breast mass, right 01/14/2021   Bronchitis    Bunion of right foot 08/26/2018   Cardiac arrhythmia    CCF (congestive cardiac failure) ([place]) 06/19/2015   Cervical neck pain with evidence of disc disease    C5/6 disease, MRI done late 2012 - no records available   Chronic combined systolic and diastolic CHF (congestive heart failure) ([place])    a. 08/2010 Echo: mildly reduced EF 40-45%, mild diffuse hypokinesis; b. 06/2016 Echo: EF 50%, no rwma, mild to mod TR; c. 03/2017 Echo: EF 40-45%, Gr1 DD (prior echo reviewed and EF felt to be lower than reported).   Chronic neck pain 12/18/2017   Chronic pain    Chronic pain of inguinal region 07/24/2021   CLAUDICATION  08/23/2010   Qualifier: Diagnosis of   By: Tamala Julian RN, Megan       Cocaine abuse, in remission ([place])    clean x 24 years   Colon polyp 06/19/2015   2010   Colon polyps    COPD (chronic obstructive pulmonary disease) ([place])    a. 12/2018 PFT: No obvious obst/restrictive dzs.   COPD exacerbation ([place]) 03/12/2022   Coronary artery disease    a. PCI of LCX 2003; b. PCI of the LAD 2012 with a (2.5 x 8 mm BMS);  c.s/p CABG 4/12: L-LAD, VG-Dx, VG-OM, VG-RCA (Dr. Prescott Gum);  d. 01/2012 MV: inf infarct, attenuation, no ischemia; e. 02/2017 MV: signifi attenuation artifact. Fixed basal antlat/inflat scar vs artifact. Reversible apical lat and mid antlat defect - ? atten vs ischemia. F/u echo w/o wma->Med rx.   Coronary artery disease involving native coronary artery of native heart without angina pectoris 08/23/2010   Depression    Depression    Diabetes mellitus without complication [facility])    Fall 05/14/2018   Family history of colon cancer    Family history of colon cancer 06/19/2015   Gastritis without bleeding    Generalized headaches    frequent   GERD (gastroesophageal reflux disease)    Headache    Heart murmur    Hematochezia    Hematochezia 01/11/2015   Hepatitis    history of hepatitis b   History of cervical cancer  s/p cryotherapy   History of cervical cancer    s/p cryotherapy   History of colonic polyps 01/11/2015   History of drug abuse ([place])    cocaine, marijuana, clean since 1989   History of drug abuse ([place])    cocaine, marijuana, clean since 1989   History of hepatitis B    from eating undercooked liver   History of MI (myocardial infarction)    HTN (hypertension) 12/20/2011   Hyperlipidemia    Hypertension    Insomnia 04/19/2020   Intractable vomiting with nausea    Intractable vomiting with nausea    Ischemic cardiomyopathy    a. 03/2017 Echo: EF 40-45%, Gr1 DD.   Lipoma of right lower extremity 08/30/2020   Mild intermittent asthma 10/13/2019   Myocardial  infarction [facility]) 2003, 2012   Need for pneumococcal 20-valent conjugate vaccination 07/06/2022   Numbness and tingling in right hand 02/24/2018   Orthostatic hypotension 04/30/2015   PAD (peripheral artery disease) ([place])    a. s/p Right SFA atherectomy and PTA 01/15/11; b. 07/2018 ABI: R 1.02, L 1.09.   Pituitary mass ([place])    a. 12/2018 MRI Brain: Stable pituitary mass w/ 69m area of necrosis. Mass abuts R cavernous sinus w/o definite invasion.   PND (post-nasal drip) 07/24/2021   Polyp of colon    Pruritus of both eyes 09/21/2020   Right knee pain 12/20/2011   Abnormal MRI right knee 08/29/2016 [facility]      FINDINGS: Medial compartment: [There is abnormal linear signal within the medial meniscus near junction of posterior horn and mid body which abuts the tibial undersurface consistent with nondisplaced tear (5:8-10). The articular cartilage, and subchondral bone are normal. 3 mm bone island (7:14).  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   Seasonal allergies    Shortness of breath 08/23/2010   Qualifier: Diagnosis of   By: STamala JulianRN, Megan       Skin lesion 04/19/2021   Sleep apnea    uses cpap   Smoking history    quit 07/2010   Smoking history 12/16/2010   Quit 07/2010   Status post total knee replacement using cement, right 04/14/2019   Thrush 04/19/2021   Thyroid disease    Urinary incontinence    Urinary incontinence 12/18/2017   Vaginal itching 04/19/2021   Vasomotor flushing 11/24/2016   Past Surgical History:  Procedure Laterality Date   ABDOMINAL HYSTERECTOMY     2003   CHOLECYSTECTOMY  1986   COLONOSCOPY  2008   3 polyps   COLONOSCOPY WITH PROPOFOL N/A 02/06/2015   Procedure: COLONOSCOPY WITH PROPOFOL;  Surgeon: DLucilla Lame MD;  Location: [facility] ENDOSCOPY;  Service: Endoscopy;  Laterality: N/A;   COLONOSCOPY WITH PROPOFOL N/A 01/27/2017   Procedure: COLONOSCOPY WITH PROPOFOL;  Surgeon: WLucilla Lame MD;  [facility]   Service: Endoscopy;  Laterality: N/A;   COLONOSCOPY WITH PROPOFOL N/A 07/15/2022   Procedure: COLONOSCOPY WITH PROPOFOL;  Surgeon: VLin Landsman MD;  [facility];  Service: Gastroenterology;  Laterality: N/A;   CORONARY ANGIOPLASTY     w/ stent placement x2   CORONARY ARTERY BYPASS GRAFT  2012   Dr GRockey Situ  CORONARY STENT PLACEMENT  2003   S/P MI   CORONARY STENT PLACEMENT  2007   [place]   ESOPHAGOGASTRODUODENOSCOPY (EGD) WITH PROPOFOL N/A 02/06/2015   Procedure: ESOPHAGOGASTRODUODENOSCOPY (EGD) WITH PROPOFOL;  Surgeon: DLucilla Lame MD;  Location: [facility] ENDOSCOPY;  Service: Endoscopy;  Laterality: N/A;   ESOPHAGOGASTRODUODENOSCOPY (EGD) WITH PROPOFOL N/A 01/27/2017   Procedure: ESOPHAGOGASTRODUODENOSCOPY (EGD) WITH PROPOFOL;  Surgeon: Lucilla Lame, MD;  Location: [facility] ENDOSCOPY;  Service: Endoscopy;  Laterality: N/A;   ESOPHAGOGASTRODUODENOSCOPY (EGD) WITH PROPOFOL N/A 09/01/2019   Procedure: ESOPHAGOGASTRODUODENOSCOPY (EGD) WITH PROPOFOL;  Surgeon: Lin Landsman, MD;  Location: [place];  Service: Gastroenterology;  Laterality: N/A;   FEMORAL ARTERY STENT  10/2010   right sided (Dr. Burt Knack)   KNEE ARTHROSCOPY WITH MEDIAL MENISECTOMY Right 08/20/2017   Procedure: KNEE ARTHROSCOPY WITH MEDIAL  AND LATERAL MENISECTOMY;  Surgeon: Hessie Knows, MD;  Location: [facility] ORS;  Service: Orthopedics;  Laterality: Right;   POSTERIOR CERVICAL LAMINECTOMY N/A 03/08/2018   Procedure: POSTERIOR CERVICAL LAMINECTOMY-C7;  Surgeon: Deetta Perla, MD;  Location: [facility] ORS;  Service: Neurosurgery;  Laterality: N/A;   TONSILLECTOMY     TOTAL KNEE ARTHROPLASTY Right 04/14/2019   Procedure: RIGHT TOTAL KNEE ARTHROPLASTY;  Surgeon: Hessie Knows, MD;  Location: [facility] ORS;  Service: Orthopedics;  Laterality: Right;   TUBAL LIGATION      Allergies  Allergies  Allergen Reactions   Latex Rash        Shellfish Allergy Anaphylaxis    Hard shellfish/swelling in throat   Sulfa Antibiotics  Anaphylaxis    Swelling in throat.   Sulfonamide Derivatives Anaphylaxis    Swelling in throat.   Watermelon [Citrullus Vulgaris] Other (See Comments)    Throat itchy   Penicillins Itching   Soy Allergy     Diarrhea/upset stomach     Tomato Itching    Throat itchy - also Raw carrots   Other Swelling    History of Present Illness    Bellah Alia is a 62 year old female with a previously mentioned past medical history including coronary artery disease status post CABG x 4 in 2020 oh, chronic combined systolic and diastolic congestive heart failure, ischemic cardiomyopathy, hypertension, hyperlipidemia, diabetes, COPD, peripheral arterial disease status post right SFA atherectomy and PTA in June 2012, and sleep apnea.  Stress testing on 04/16/2017 revealed no areas of ischemia.  Echocardiogram showed stable mild LV dysfunction with EF of 40-45%.  Repeat stress testing completed showed poor perfusion uptake on stress imaging with significant GI uptake artifact, no resting images were available for review despite image reprocessing, ejection fraction estimated at 44%, unable to exclude perfusion abnormality, considering altered study if continued symptoms.  Echocardiogram was completed 04/17/2022 which revealed LVEF of 45-50%, no regional wall motion abnormalities, G2 DD, mild to moderate mitral regurgitation, moderate tricuspid regurgitation.  She was last seen in clinic 05/19/2022 and overall she has been doing fairly well.  She continues to suffer from shoulder pain that would later require surgery.  She had been tolerating increased amount of Lasix that was prescribed after recent echocardiogram without incident and denied any recent hospitalizations.  She had blood work that was ordered and no further testing that was needed at that time.  She returns to clinic today stating overall she has been doing well.  She denies any chest discomfort, shortness of breath, dizziness lightheadedness,  palpitations.  She does continue to suffer from shoulder pain but is opted currently did not undergo any surgical procedures.  She also underwent colonoscopy and stated that she had several polyps that were removed and required clipping but she is now back to precancerous.  She denies any recent hospitalizations or visits to the emergency department and has also started a vegan diet supplement called "hot mama" to help with her menopausal symptoms.  Home Medications    Current Outpatient Medications  Medication Sig Dispense Refill   acetaminophen (TYLENOL 8 HOUR) 650 MG CR tablet Take 2 tablets (1,300 mg total) by mouth 2 (two) times daily as needed for pain. 60 tablet 1   albuterol (VENTOLIN HFA) 108 (90 Base) MCG/ACT inhaler Inhale 2 puffs into the lungs every 6 (six) hours as needed for wheezing or shortness of breath. 1 g 12   aspirin EC 81 MG tablet Take 1 tablet (81 mg total) by mouth daily. 90 tablet 3   Azelastine HCl 0.15 % SOLN Place 2 sprays into both nostrils daily as needed (allergies). 30 mL 11   clobetasol cream (TEMOVATE) 6.96 % Apply 1 application. topically 2 (two) times daily. Prn left elbow 30 g 0   Cyanocobalamin (VITAMIN B-12) 1000 MCG SUBL Place 0.001 tablets (1 mcg total) under the tongue daily. 90 tablet 3   dapagliflozin propanediol (FARXIGA) 10 MG TABS tablet Take 1 tablet (10 mg total) by mouth daily before breakfast. 90 tablet 3   dicyclomine (BENTYL) 10 MG capsule Take 1 capsule (10 mg total) by mouth 3 (three) times daily before meals. As needed for abdominal pain 120 capsule 11   EPINEPHrine 0.3 mg/0.3 mL IJ SOAJ injection Inject one pen into the thigh as needed for anaphylactic reaction. Then CALL 911. Use second pen if needed thereafter 2 each 5   esomeprazole (NEXIUM) 40 MG capsule Take 1 capsule (40 mg total) by mouth daily. 90 capsule 0   Evolocumab (REPATHA SURECLICK) 295 MG/ML SOAJ INJECT 1 DOSE SUBCUTANEOUSLY EVERY 14 DAYS 2 mL 0   ezetimibe (ZETIA) 10 MG  tablet Take 1 tablet (10 mg total) by mouth daily. 90 tablet 3   fluticasone (FLONASE) 50 MCG/ACT nasal spray Place 2 sprays into both nostrils daily. Prn nasal congestion 16 g 11   Fluticasone-Umeclidin-Vilant (TRELEGY ELLIPTA) 100-62.5-25 MCG/ACT AEPB Inhale 1 puff into the lungs daily. Rinse mouth 1 each 11   furosemide (LASIX) 20 MG tablet TAKE 1 TABLET BY MOUTH ONCE DAILY AND 1 TABLET AT LUNCH THREE TIMES WEEKLY 126 tablet 0   hydrOXYzine (ATARAX) 25 MG tablet Take 1 tablet (25 mg total) by mouth daily as needed for anxiety. 60 tablet 1   ipratropium (ATROVENT) 0.06 % nasal spray Place 2 sprays into both nostrils 4 (four) times daily. 15 mL 12   ipratropium-albuterol (DUONEB) 0.5-2.5 (3) MG/3ML SOLN USE 1 VIAL IN NEBULIZER TWICE DAILY AS NEEDED FOR WHEEZING OR SHORTNESS OF BREATH 360 mL 3   isosorbide mononitrate (IMDUR) 30 MG 24 hr tablet Take 1 tablet by mouth twice daily 180 tablet 2   levocetirizine (XYZAL) 5 MG tablet Take 1 tablet (5 mg total) by mouth at bedtime as needed for allergies. 90 tablet 3   linaclotide (LINZESS) 145 MCG CAPS capsule TAKE 1 CAPSULE BY MOUTH BEFORE BREAKFAST 30 capsule 1   metoprolol succinate (TOPROL-XL) 50 MG 24 hr tablet TAKE 1 TABLET BY MOUTH ONCE DAILY WITH OR IMMEDIATELY FOLLOWING A MEAL 90 tablet 1   montelukast (SINGULAIR) 10 MG tablet Take 1 tablet (10 mg total) by mouth at bedtime. 90 tablet 3   nitroGLYCERIN (NITROSTAT) 0.4 MG SL tablet DISSOLVE ONE TABLET UNDER THE TONGUE EVERY 5 MINUTES AS NEEDED FOR CHEST PAIN.  DO NOT EXCEED A TOTAL OF 3 DOSES IN 15 MINUTES 25 tablet 2   olopatadine (PATANOL) 0.1 % ophthalmic solution INSTILL 1 DROP INTO EACH EYE TWICE DAILY 10 mL 0   OneTouch Delica Lancets  33G MISC USE TO CHECK GLUCOSE IN THE MORNING AND AT BEDTIME 200 each 0   ONETOUCH VERIO test strip USE 1 STRIP TO CHECK GLUCOSE TWICE DAILY ONCE  IN  THE  MORNING  AND  ONCE  AT  BEDTIME 200 each 0   PARoxetine (PAXIL) 20 MG tablet Take 1 tablet (20 mg total) by  mouth daily. 90 tablet 3   potassium chloride (KLOR-CON M) 10 MEQ tablet Take 1 tablet (10 mEq total) by mouth daily. TAKE 1 TABLET ONCE DAILY AND 1 TABLET WITH FUROSEMIDE AT LUNCH THREE TIMES A WEEK 90 tablet 3   pregabalin (LYRICA) 75 MG capsule Take 1 capsule (75 mg total) by mouth daily. 90 capsule 1   rivaroxaban (XARELTO) 2.5 MG TABS tablet Take 1 tablet by mouth twice daily 180 tablet 1   rosuvastatin (CRESTOR) 20 MG tablet Take 1 tablet (20 mg total) by mouth at bedtime. 90 tablet 1   sodium chloride (OCEAN) 0.65 % SOLN nasal spray Place 2 sprays into both nostrils daily as needed for congestion. 30 mL 11   tiZANidine (ZANAFLEX) 4 MG tablet Take 1 tablet (4 mg total) by mouth every 8 (eight) hours as needed for muscle spasms. 30 tablet 1   meclizine (ANTIVERT) 12.5 MG tablet TAKE 1 TABLET BY MOUTH TWICE DAILY AS NEEDED FOR DIZZINESS (Patient not taking: Reported on 08/19/2022)     No current facility-administered medications for this visit.     Family History    Family History  Problem Relation Age of Onset   Other Mother    Breast cancer Mother 30       breast cancer, late 56's   Cancer Mother        breast   Hypertension Father    Heart failure Father    Diabetes Father    Colon cancer Father 48   Glaucoma Father    Cancer Father        colorectal    Heart disease Father    Other Father        glaucoma   Brain cancer Sister    Arthritis Sister    Diabetes Sister    Hypertension Sister    Kidney disease Sister    Diabetes Brother    Hypertension Brother    Acute myelogenous leukemia Grandson        06/2019   Coronary artery disease Neg Hx    Stroke Neg Hx    She indicated that her mother is deceased. She indicated that her father is deceased. She indicated that both of her sisters are alive. She indicated that the status of her brother is unknown. She indicated that the status of her neg hx is unknown. She indicated that the status of her grandson is  unknown.  Social History    Social History   Socioeconomic History   Marital status: Legally Separated    Spouse name: Not on file   Number of children: Not on file   Years of education: Not on file   Highest education level: Not on file  Occupational History   Not on file  Tobacco Use   Smoking status: Former    Packs/day: 2.00    Years: 38.00    Total pack years: 76.00    Types: Cigarettes    Quit date: 08/12/2010    Years since quitting: 12.0   Smokeless tobacco: Never  Vaping Use   Vaping Use: Never used  Substance and Sexual Activity   Alcohol  use: No   Drug use: No    Types: Cocaine    Comment: Remote Hx (crack cocaine and marijuana).none since 24yr plus   Sexual activity: Yes  Other Topics Concern   Not on file  Social History Narrative   Caffeine: 1 cup coffee/day   Lives with family, no pets   Occupation: industrial work, 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   Edu: 11th grade   Activity: no regular exercise   Diet: good water, vegetables daily, low salt diet   Lives with sister and other family    No guns, wears seat belts, safe in relationship    2 kids    GED 1 year of college    Social Determinants of Health   Financial Resource Strain: Low Risk  (08/15/2022)   Overall Financial Resource Strain (CARDIA)    Difficulty of Paying Living Expenses: Not hard at all  Food Insecurity: No Food Insecurity (08/15/2022)   Hunger Vital Sign    Worried About Running Out of Food in the Last Year: Never true    R[place]woodin the Last Year: Never true  Transportation Needs: No Transportation Needs (08/15/2022)   PRAPARE - THydrologist(Medical): No    Lack of Transportation (Non-Medical): No  Physical Activity: Unknown (08/03/2020)   Exercise Vital Sign    Days of Exercise per Week: 0 days    Minutes of Exercise per Session: Not on file  Stress: No Stress Concern Present (08/15/2022)   [place]   Feeling of Stress : Not at all  Social Connections: Unknown (08/15/2022)   Social Connection and Isolation Panel [NHANES]    Frequency of Communication with Friends and Family: More than three times a week    Frequency of Social Gatherings with Friends and Family: Once a week    Attends Religious Services: 1 to 4 times per year    Active Member of [employer] Organizations: Yes    Attends CMusic therapist More than 4 times per year    Marital Status: Not on file  Intimate Partner Violence: Not At Risk (08/15/2022)   Humiliation, Afraid, Rape, and Kick questionnaire    Fear of Current or Ex-Partner: No    Emotionally Abused: No    Physically Abused: No    Sexually Abused: No     Review of Systems    General:  No chills, fever, night sweats or weight changes.  Endorses exertional fatigue Cardiovascular:  No chest pain, dyspnea on exertion, edema, orthopnea, palpitations, paroxysmal nocturnal dyspnea. Dermatological: No rash, lesions/masses Respiratory: No cough, dyspnea Urologic: No hematuria, dysuria Abdominal:   No nausea, vomiting, diarrhea, bright red blood per rectum, melena, or hematemesis Neurologic:  No visual changes, wkns, changes in mental status. All other systems reviewed and are otherwise negative except as noted above.   Physical Exam    VS:  BP 130/80 (BP Location: Left Arm, Patient Position: Sitting, Cuff Size: Normal)   Pulse 70   Ht '5\' 5"'$  (1.651 m)   Wt 207 lb 9.6 oz (94.2 kg)   SpO2 98%   BMI 34.55 kg/m  , BMI Body mass index is 34.55 kg/m.     GEN: Well nourished, well developed, in no acute distress. HEENT: normal.  Glasses on Neck: Supple, no JVD, carotid bruits, or masses. Cardiac: RRR, no murmurs, rubs, or gallops. No clubbing, cyanosis, edema.  Radials 2+/PT 2+ and equal  bilaterally.  Respiratory:  Respirations regular and unlabored, clear to auscultation bilaterally. GI: Soft, nontender, nondistended, BS + x  4. MS: no deformity or atrophy. Skin: warm and dry, no rash. Neuro:  Strength and sensation are intact. Psych: Normal affect.  Accessory Clinical Findings    ECG personally reviewed by me today-no new tracings completed today  Lab Results  Component Value Date   WBC 6.3 07/01/2022   HGB 14.5 07/01/2022   HCT 43.6 07/01/2022   MCV 94.6 07/01/2022   PLT 177.0 07/01/2022   Lab Results  Component Value Date   CREATININE 1.28 (H) 07/01/2022   BUN 15 07/01/2022   NA 139 07/01/2022   K 3.7 07/01/2022   CL 107 07/01/2022   CO2 23 07/01/2022   Lab Results  Component Value Date   ALT 24 07/01/2022   AST 21 07/01/2022   ALKPHOS 90 07/01/2022   BILITOT 0.2 07/01/2022   Lab Results  Component Value Date   CHOL 134 12/06/2021   HDL 43.70 12/06/2021   LDLCALC 68 12/06/2021   TRIG 113.0 12/06/2021   CHOLHDL 3 12/06/2021    Lab Results  Component Value Date   HGBA1C 6.7 (H) 07/01/2022    Assessment & Plan   1.  Combined chronic systolic and diastolic congestive heart failure/ischemic cardiomyopathy with previous LVEF 45-50%, no regional wall motion abnormalities, G2 DD, right ventricular systolic function moderately reduced, mildly elevated pulmonary artery systolic pressure, mild to moderate mitral valve regurgitation with moderate tricuspid regurgitation.  She is continued on Farxiga, furosemide, Toprol-XL 50 mg.  She denies any shortness of breath, peripheral edema, PND, orthopnea, early satiety.  Appears euvolemic on exam today.  Encouraged to weigh daily, watch her sodium intake, and continue with her activity.  2.  Coronary artery disease status post CABG with stable angina.  Denies any current chest pain.  Echocardiogram without wall motion abnormalities.  She is continued on aspirin, statin, Imdur, and rosuvastatin, and Nitrostat as needed.  No further ischemic evaluation needed at this time.  3.  Essential hypertension with blood pressure of 130/80.  Which is stable.  She  is continued on Imdur and Toprol-XL.  She is also encouraged to continue to monitor her pressure at home.  No refills are needed today and no changes were made to her medication regimen at this time.  4. Hyperlipidemia with LDL 68 12/06/21. Continued on rosuvastatin and Zetia. Continues to be managed by her PCP.  5.  Peripheral arterial disease status post arthrectomy to the right SFA on 01/15/2011.  She continues to deny any claudication symptoms palpable pulses on exam.  She is continued on Xarelto 2.5 mg twice daily.  6.  Obstructive sleep apnea with continued compliance with CPAP  7.  Disposition patient to return to clinic to see MD/APP in 6 months or sooner if needed.  Alem Fahl, NP 08/19/2022, 12:36 PM

## 2022-08-19 ENCOUNTER — Ambulatory Visit: Payer: Medicare HMO | Attending: Cardiology | Admitting: Cardiology

## 2022-08-19 ENCOUNTER — Encounter: Payer: Self-pay | Admitting: Cardiology

## 2022-08-19 VITALS — BP 130/80 | HR 70 | Ht 65.0 in | Wt 207.6 lb

## 2022-08-19 DIAGNOSIS — I10 Essential (primary) hypertension: Secondary | ICD-10-CM | POA: Diagnosis not present

## 2022-08-19 DIAGNOSIS — I739 Peripheral vascular disease, unspecified: Secondary | ICD-10-CM

## 2022-08-19 DIAGNOSIS — I5042 Chronic combined systolic (congestive) and diastolic (congestive) heart failure: Secondary | ICD-10-CM

## 2022-08-19 DIAGNOSIS — I255 Ischemic cardiomyopathy: Secondary | ICD-10-CM | POA: Diagnosis not present

## 2022-08-19 DIAGNOSIS — E785 Hyperlipidemia, unspecified: Secondary | ICD-10-CM

## 2022-08-19 DIAGNOSIS — I25118 Atherosclerotic heart disease of native coronary artery with other forms of angina pectoris: Secondary | ICD-10-CM | POA: Diagnosis not present

## 2022-08-19 DIAGNOSIS — G4733 Obstructive sleep apnea (adult) (pediatric): Secondary | ICD-10-CM | POA: Diagnosis not present

## 2022-08-19 NOTE — Patient Instructions (Signed)
Medication Instructions:  Your Physician recommend you continue on your current medication as directed.    *If you need a refill on your cardiac medications before your next appointment, please call your pharmacy*   Lab Work: None ordered today   Testing/Procedures: None ordered today   Follow-Up: At [facility], you and your health needs are our priority.  As part of our continuing mission to provide you with exceptional heart care, we have created designated Provider Care Teams.  These Care Teams include your primary Cardiologist (physician) and Advanced Practice Providers (APPs -  Physician Assistants and Nurse Practitioners) who all work together to provide you with the care you need, when you need it.  We recommend signing up for the patient portal called "MyChart".  Sign up information is provided on this After Visit Summary.  MyChart is used to connect with patients for Virtual Visits (Telemedicine).  Patients are able to view lab/test results, encounter notes, upcoming appointments, etc.  Non-urgent messages can be sent to your provider as well.   To learn more about what you can do with MyChart, go to [email or web address].    Your next appointment:   6 month(s)  Provider:   You may see Ida Rogue, MD or one of the following Advanced Practice Providers on your designated Care Team:   Murray Hodgkins, NP Christell Faith, PA-C Cadence Kathlen Mody, PA-C Gerrie Nordmann, NP

## 2022-08-24 DIAGNOSIS — G4710 Hypersomnia, unspecified: Secondary | ICD-10-CM | POA: Diagnosis not present

## 2022-08-24 DIAGNOSIS — I259 Chronic ischemic heart disease, unspecified: Secondary | ICD-10-CM | POA: Diagnosis not present

## 2022-08-24 DIAGNOSIS — G4733 Obstructive sleep apnea (adult) (pediatric): Secondary | ICD-10-CM | POA: Diagnosis not present

## 2022-08-25 DIAGNOSIS — J449 Chronic obstructive pulmonary disease, unspecified: Secondary | ICD-10-CM | POA: Diagnosis not present

## 2022-09-01 DIAGNOSIS — N2581 Secondary hyperparathyroidism of renal origin: Secondary | ICD-10-CM | POA: Diagnosis not present

## 2022-09-01 DIAGNOSIS — I10 Essential (primary) hypertension: Secondary | ICD-10-CM | POA: Diagnosis not present

## 2022-09-01 DIAGNOSIS — E1122 Type 2 diabetes mellitus with diabetic chronic kidney disease: Secondary | ICD-10-CM | POA: Diagnosis not present

## 2022-09-01 DIAGNOSIS — I509 Heart failure, unspecified: Secondary | ICD-10-CM | POA: Diagnosis not present

## 2022-09-02 DIAGNOSIS — G4733 Obstructive sleep apnea (adult) (pediatric): Secondary | ICD-10-CM | POA: Diagnosis not present

## 2022-09-02 DIAGNOSIS — G4710 Hypersomnia, unspecified: Secondary | ICD-10-CM | POA: Diagnosis not present

## 2022-09-02 DIAGNOSIS — I259 Chronic ischemic heart disease, unspecified: Secondary | ICD-10-CM | POA: Diagnosis not present

## 2022-09-04 ENCOUNTER — Other Ambulatory Visit: Payer: Self-pay | Admitting: Cardiology

## 2022-09-06 DIAGNOSIS — I259 Chronic ischemic heart disease, unspecified: Secondary | ICD-10-CM | POA: Diagnosis not present

## 2022-09-06 DIAGNOSIS — G4733 Obstructive sleep apnea (adult) (pediatric): Secondary | ICD-10-CM | POA: Diagnosis not present

## 2022-09-06 DIAGNOSIS — G4710 Hypersomnia, unspecified: Secondary | ICD-10-CM | POA: Diagnosis not present

## 2022-09-15 ENCOUNTER — Other Ambulatory Visit: Payer: Self-pay | Admitting: Cardiovascular Disease

## 2022-09-15 DIAGNOSIS — J449 Chronic obstructive pulmonary disease, unspecified: Secondary | ICD-10-CM | POA: Diagnosis not present

## 2022-09-15 DIAGNOSIS — R609 Edema, unspecified: Secondary | ICD-10-CM

## 2022-09-19 ENCOUNTER — Other Ambulatory Visit: Payer: Self-pay | Admitting: Family Medicine

## 2022-09-19 DIAGNOSIS — B370 Candidal stomatitis: Secondary | ICD-10-CM

## 2022-09-22 ENCOUNTER — Other Ambulatory Visit: Payer: Self-pay | Admitting: Family Medicine

## 2022-09-22 DIAGNOSIS — K5909 Other constipation: Secondary | ICD-10-CM

## 2022-09-24 DIAGNOSIS — G4710 Hypersomnia, unspecified: Secondary | ICD-10-CM | POA: Diagnosis not present

## 2022-09-24 DIAGNOSIS — G4733 Obstructive sleep apnea (adult) (pediatric): Secondary | ICD-10-CM | POA: Diagnosis not present

## 2022-09-24 DIAGNOSIS — I259 Chronic ischemic heart disease, unspecified: Secondary | ICD-10-CM | POA: Diagnosis not present

## 2022-09-25 ENCOUNTER — Other Ambulatory Visit: Payer: Self-pay | Admitting: Family Medicine

## 2022-09-25 ENCOUNTER — Telehealth: Payer: Self-pay | Admitting: Family Medicine

## 2022-09-25 DIAGNOSIS — H579 Unspecified disorder of eye and adnexa: Secondary | ICD-10-CM

## 2022-09-25 DIAGNOSIS — G629 Polyneuropathy, unspecified: Secondary | ICD-10-CM

## 2022-09-25 MED ORDER — OLOPATADINE HCL 0.1 % OP SOLN: OPHTHALMIC | 1 refills | Status: DC

## 2022-09-25 MED ORDER — PREGABALIN 75 MG PO CAPS: 75.0000 mg | ORAL_CAPSULE | Freq: Every day | ORAL | 3 refills | Status: DC

## 2022-09-25 NOTE — Telephone Encounter (Signed)
Prescription Request  09/25/2022   LOV: 07/01/2022  What is the name of the medication or equipment? pregabalin (LYRICA) 75 MG capsule, olopatadine (PATANOL) 0.1 % ophthalmic solution,   Have you contacted your pharmacy to request a refill? No   Which pharmacy would you like this sent to?   [facility] (N), [place] [address] [place] ([place]) [place] [address] Phone: [phone] Fax: [phone]     Patient notified that their request is being sent to the clinical staff for review and that they should receive a response within 2 business days.   Please advise at [facility] [phone]

## 2022-09-30 ENCOUNTER — Other Ambulatory Visit: Payer: Self-pay

## 2022-09-30 DIAGNOSIS — K219 Gastro-esophageal reflux disease without esophagitis: Secondary | ICD-10-CM

## 2022-09-30 DIAGNOSIS — G629 Polyneuropathy, unspecified: Secondary | ICD-10-CM

## 2022-09-30 MED ORDER — ESOMEPRAZOLE MAGNESIUM 40 MG PO CPDR: 40.0000 mg | DELAYED_RELEASE_CAPSULE | Freq: Every day | ORAL | 3 refills | Status: DC

## 2022-09-30 MED ORDER — PREGABALIN 75 MG PO CAPS: 75.0000 mg | ORAL_CAPSULE | Freq: Two times a day (BID) | ORAL | 3 refills | Status: DC

## 2022-09-30 NOTE — Telephone Encounter (Signed)
Fax came in and patient is requesting a refill for Lyrica 75 mg two times daily.  To be sent to [facility].  [name],cma

## 2022-10-01 NOTE — Telephone Encounter (Signed)
Prescription Request  10/01/2022  LOV: Visit date not found  What is the name of the medication or equipment? olopatadine (PATANOL) 0.1 % ophthalmic solution and pregabalin (LYRICA) 75 MG capsule  Have you contacted your pharmacy to request a refill? Yes   Which pharmacy would you like this sent to?   [facility] ([facility]), [place] - [address] [place] [address] Phone: [phone] Fax: [phone]      Patient notified that their request is being sent to the clinical staff for review and that they should receive a response within 2 business days.   Please advise at Mobile [phone] (mobile)

## 2022-10-07 DIAGNOSIS — J449 Chronic obstructive pulmonary disease, unspecified: Secondary | ICD-10-CM | POA: Diagnosis not present

## 2022-10-09 ENCOUNTER — Other Ambulatory Visit: Payer: Self-pay

## 2022-10-09 ENCOUNTER — Encounter: Payer: Self-pay | Admitting: Family Medicine

## 2022-10-09 ENCOUNTER — Telehealth: Payer: Self-pay | Admitting: Family Medicine

## 2022-10-09 DIAGNOSIS — Z1231 Encounter for screening mammogram for malignant neoplasm of breast: Secondary | ICD-10-CM

## 2022-10-09 NOTE — Telephone Encounter (Signed)
I called and let the patient know that I ordered her mammogram and she can either call and schedule or she can wait for them to call her and she understood.  Marie Borowski,cma

## 2022-10-09 NOTE — Telephone Encounter (Signed)
Pt called regarding putting in a certain mammogram referral. Pt stated it is in her file what the mammogram is called because she had it done with [facility]

## 2022-10-15 ENCOUNTER — Telehealth: Payer: Self-pay

## 2022-10-15 NOTE — Telephone Encounter (Signed)
Called and spoke with pt to go voer meds and confirm pharmacy and get more info on reason for visit.

## 2022-10-15 NOTE — Progress Notes (Signed)
Bethanie Dicker, NP-C Phone: [phone]  [name] is a 62 y.o. female who presents today for cough and congestion x 1 week.   Respiratory illness:  Cough- Yes  Congestion-    Sinus- Yes, nasal, pressure   Chest- No  Post nasal drip- Yes  Sore throat- Irritation  Shortness of breath- No  Fever- No  Fatigue/Myalgia- No Headache- No Nausea/Vomiting- No Taste disturbance- No  Smell disturbance- No  Covid exposure- No  Covid vaccination- x 2  Flu vaccination- UTD  Medications- None  She is also requesting a referral to Podiatry for bilateral bunions. She reports having worsening left foot pain across the dorsal side of her foot. Reports swelling and tenderness to the touch. Denies any recent fall or injury. Reports pain with weight bearing and walking.    Social History   Tobacco Use  Smoking Status Former   Packs/day: 2.00   Years: 38.00   Additional pack years: 0.00   Total pack years: 76.00   Types: Cigarettes   Quit date: 08/12/2010   Years since quitting: 12.1  Smokeless Tobacco Never    Current Outpatient Medications on File Prior to Visit  Medication Sig Dispense Refill   acetaminophen (TYLENOL 8 HOUR) 650 MG CR tablet Take 2 tablets (1,300 mg total) by mouth 2 (two) times daily as needed for pain. 60 tablet 1   albuterol (VENTOLIN HFA) 108 (90 Base) MCG/ACT inhaler Inhale 2 puffs into the lungs every 6 (six) hours as needed for wheezing or shortness of breath. 1 g 12   aspirin EC 81 MG tablet Take 1 tablet (81 mg total) by mouth daily. 90 tablet 3   Azelastine HCl 0.15 % SOLN Place 2 sprays into both nostrils daily as needed (allergies). 30 mL 11   Cyanocobalamin (VITAMIN B-12) 1000 MCG SUBL Place 0.001 tablets (1 mcg total) under the tongue daily. 90 tablet 3   dapagliflozin propanediol (FARXIGA) 10 MG TABS tablet Take 1 tablet (10 mg total) by mouth daily before breakfast. 90 tablet 3   dicyclomine (BENTYL) 10 MG capsule Take 1 capsule (10 mg total) by  mouth 3 (three) times daily before meals. As needed for abdominal pain 120 capsule 11   EPINEPHrine 0.3 mg/0.3 mL IJ SOAJ injection Inject one pen into the thigh as needed for anaphylactic reaction. Then CALL 911. Use second pen if needed thereafter 2 each 5   esomeprazole (NEXIUM) 40 MG capsule Take 1 capsule (40 mg total) by mouth daily. 90 capsule 3   Evolocumab (REPATHA SURECLICK) 140 MG/ML SOAJ INJECT 1 DOSE SUBCUTANEOUSLY EVERY 14 DAYS 2 mL 4   ezetimibe (ZETIA) 10 MG tablet Take 1 tablet (10 mg total) by mouth daily. 90 tablet 3   fluticasone (FLONASE) 50 MCG/ACT nasal spray Place 2 sprays into both nostrils daily. Prn nasal congestion 16 g 11   Fluticasone-Umeclidin-Vilant (TRELEGY ELLIPTA) 100-62.5-25 MCG/ACT AEPB Inhale 1 puff into the lungs daily. Rinse mouth 1 each 11   furosemide (LASIX) 20 MG tablet TAKE 1 TABLET BY MOUTH ONCE DAILY AND 1 TABLET AT LUNCH 3 TIMES A WEEK 126 tablet 0   GEMTESA 75 MG TABS Take 1 tablet by mouth daily.     hydrOXYzine (ATARAX) 25 MG tablet Take 1 tablet (25 mg total) by mouth daily as needed for anxiety. 60 tablet 1   ipratropium (ATROVENT) 0.06 % nasal spray Place 2 sprays into both nostrils 4 (four) times daily. 15 mL 12   ipratropium-albuterol (DUONEB) 0.5-2.5 (3) MG/3ML SOLN USE 1  VIAL IN NEBULIZER TWICE DAILY AS NEEDED FOR WHEEZING OR SHORTNESS OF BREATH 360 mL 3   isosorbide mononitrate (IMDUR) 30 MG 24 hr tablet Take 1 tablet by mouth twice daily 180 tablet 2   levocetirizine (XYZAL) 5 MG tablet Take 1 tablet (5 mg total) by mouth at bedtime as needed for allergies. 90 tablet 3   linaclotide (LINZESS) 145 MCG CAPS capsule TAKE 1 CAPSULE BY MOUTH BEFORE BREAKFAST 90 capsule 3   metoprolol succinate (TOPROL-XL) 50 MG 24 hr tablet TAKE 1 TABLET BY MOUTH ONCE DAILY WITH OR IMMEDIATELY FOLLOWING A MEAL 90 tablet 1   montelukast (SINGULAIR) 10 MG tablet Take 1 tablet (10 mg total) by mouth at bedtime. 90 tablet 3   nitroGLYCERIN (NITROSTAT) 0.4 MG SL  tablet DISSOLVE ONE TABLET UNDER THE TONGUE EVERY 5 MINUTES AS NEEDED FOR CHEST PAIN.  DO NOT EXCEED A TOTAL OF 3 DOSES IN 15 MINUTES 25 tablet 2   olopatadine (PATANOL) 0.1 % ophthalmic solution INSTILL 1 DROP INTO EACH EYE TWICE DAILY 10 mL 1   OneTouch Delica Lancets 33G MISC USE TO CHECK GLUCOSE IN THE MORNING AND AT BEDTIME 200 each 0   ONETOUCH VERIO test strip USE 1 STRIP TO CHECK GLUCOSE TWICE DAILY ONCE  IN  THE  MORNING  AND  ONCE  AT  BEDTIME 200 each 0   PARoxetine (PAXIL) 20 MG tablet Take 1 tablet (20 mg total) by mouth daily. 90 tablet 3   potassium chloride (KLOR-CON M) 10 MEQ tablet Take 1 tablet (10 mEq total) by mouth daily. TAKE 1 TABLET ONCE DAILY AND 1 TABLET WITH FUROSEMIDE AT LUNCH THREE TIMES A WEEK 90 tablet 3   pregabalin (LYRICA) 75 MG capsule Take 1 capsule (75 mg total) by mouth 2 (two) times daily. 60 capsule 3   rivaroxaban (XARELTO) 2.5 MG TABS tablet Take 1 tablet by mouth twice daily 180 tablet 1   rosuvastatin (CRESTOR) 20 MG tablet Take 1 tablet (20 mg total) by mouth at bedtime. 90 tablet 1   sodium chloride (OCEAN) 0.65 % SOLN nasal spray Place 2 sprays into both nostrils daily as needed for congestion. 30 mL 11   tiZANidine (ZANAFLEX) 4 MG tablet Take 1 tablet (4 mg total) by mouth every 8 (eight) hours as needed for muscle spasms. 30 tablet 1   No current facility-administered medications on file prior to visit.    ROS see history of present illness  Objective  Physical Exam Vitals:   10/16/22 0855  BP: 116/80  Pulse: 66  Temp: 98 F (36.7 C)  SpO2: 98%    BP Readings from Last 3 Encounters:  10/16/22 116/80  08/19/22 130/80  07/15/22 109/88   Wt Readings from Last 3 Encounters:  10/16/22 201 lb 6.4 oz (91.4 kg)  08/19/22 207 lb 9.6 oz (94.2 kg)  08/15/22 199 lb (90.3 kg)    Physical Exam Constitutional:      General: She is not in acute distress.    Appearance: Normal appearance.  HENT:     Head: Normocephalic.     Right Ear:  Tympanic membrane normal.     Left Ear: Tympanic membrane normal.     Nose: Congestion present.     Mouth/Throat:     Mouth: Mucous membranes are moist.     Pharynx: Oropharynx is clear.  Eyes:     Conjunctiva/sclera: Conjunctivae normal.     Pupils: Pupils are equal, round, and reactive to light.  Cardiovascular:  Rate and Rhythm: Normal rate and regular rhythm.     Heart sounds: Normal heart sounds.  Pulmonary:     Effort: Pulmonary effort is normal.     Breath sounds: Normal breath sounds.  Abdominal:     General: Abdomen is flat. Bowel sounds are normal.     Palpations: Abdomen is soft. There is no mass.     Tenderness: There is no abdominal tenderness.  Musculoskeletal:     Right foot: Bunion present.     Left foot: Normal range of motion. Swelling (dorsal surface), bunion and tenderness present.  Lymphadenopathy:     Cervical: No cervical adenopathy.  Skin:    General: Skin is warm and dry.  Neurological:     General: No focal deficit present.     Mental Status: She is alert.  Psychiatric:        Mood and Affect: Mood normal.        Behavior: Behavior normal.    Assessment/Plan: Please see individual problem list.  Left foot pain Assessment & Plan: Will get x-ray for further evaluation and refer to Podiatry. Encouraged rest, ice, elevation, and Tylenol PRN. Will contact patient with x-ray results.   Orders: -     DG Foot Complete Left; Future -     Ambulatory referral to Podiatry  Bilateral bunions Assessment & Plan: Will refer to Podiatry.   Orders: -     Ambulatory referral to Podiatry  Sinus congestion Assessment & Plan: Symptoms x 1 week. Will treat with Zpak and Benzonatate for cough. Encouraged adequate hydration. She can continue her nasal spray and antihistamine daily. Return precautions given to patient.   Orders: -     Azithromycin; Take 2 tablets on day 1, then 1 tablet daily on days 2 through 5  Dispense: 6 tablet; Refill: 0 -      Benzonatate; Take 1 capsule (200 mg total) by mouth 3 (three) times daily as needed for cough.  Dispense: 30 capsule; Refill: 0   Return if symptoms worsen or fail to improve.   Bethanie Dicker, NP-C [facility] [employer] [facility]

## 2022-10-16 ENCOUNTER — Encounter: Payer: Self-pay | Admitting: Nurse Practitioner

## 2022-10-16 ENCOUNTER — Ambulatory Visit
Admission: RE | Admit: 2022-10-16 | Discharge: 2022-10-16 | Disposition: A | Discharge status: Home / Self Care | Payer: Medicare HMO | Source: Ambulatory Visit | Attending: Nurse Practitioner | Admitting: Nurse Practitioner

## 2022-10-16 ENCOUNTER — Ambulatory Visit (INDEPENDENT_AMBULATORY_CARE_PROVIDER_SITE_OTHER): Payer: Medicare HMO | Admitting: Nurse Practitioner

## 2022-10-16 ENCOUNTER — Other Ambulatory Visit: Payer: Self-pay

## 2022-10-16 VITALS — BP 116/80 | HR 66 | Temp 98.0°F | Ht 65.0 in | Wt 201.4 lb

## 2022-10-16 DIAGNOSIS — M21611 Bunion of right foot: Secondary | ICD-10-CM | POA: Diagnosis not present

## 2022-10-16 DIAGNOSIS — M79672 Pain in left foot: Secondary | ICD-10-CM

## 2022-10-16 DIAGNOSIS — R0981 Nasal congestion: Secondary | ICD-10-CM | POA: Diagnosis not present

## 2022-10-16 DIAGNOSIS — M21612 Bunion of left foot: Secondary | ICD-10-CM | POA: Diagnosis not present

## 2022-10-16 DIAGNOSIS — H579 Unspecified disorder of eye and adnexa: Secondary | ICD-10-CM

## 2022-10-16 DIAGNOSIS — L309 Dermatitis, unspecified: Secondary | ICD-10-CM

## 2022-10-16 DIAGNOSIS — M2012 Hallux valgus (acquired), left foot: Secondary | ICD-10-CM | POA: Diagnosis not present

## 2022-10-16 DIAGNOSIS — M19072 Primary osteoarthritis, left ankle and foot: Secondary | ICD-10-CM | POA: Diagnosis not present

## 2022-10-16 MED ORDER — AZITHROMYCIN 250 MG PO TABS: ORAL_TABLET | ORAL | 0 refills | Status: AC

## 2022-10-16 MED ORDER — BENZONATATE 200 MG PO CAPS: 200.0000 mg | ORAL_CAPSULE | Freq: Three times a day (TID) | ORAL | 0 refills | Status: DC | PRN

## 2022-10-16 MED ORDER — CLOBETASOL PROPIONATE 0.05 % EX CREA: 1.0000 | TOPICAL_CREAM | Freq: Two times a day (BID) | CUTANEOUS | 0 refills | Status: DC

## 2022-10-16 NOTE — Assessment & Plan Note (Signed)
Will refer to Podiatry

## 2022-10-16 NOTE — Telephone Encounter (Signed)
I called the pharmacy and she has refills and they are getting it ready for patient.  Shandrell Boda,cma

## 2022-10-16 NOTE — Assessment & Plan Note (Signed)
Will get x-ray for further evaluation and refer to Podiatry. Encouraged rest, ice, elevation, and Tylenol PRN. Will contact patient with x-ray results.

## 2022-10-16 NOTE — Assessment & Plan Note (Addendum)
Symptoms x 1 week. Will treat with Zpak and Benzonatate for cough. Encouraged adequate hydration. She can continue her nasal spray and antihistamine daily. Return precautions given to patient.

## 2022-10-17 NOTE — Telephone Encounter (Signed)
Pt called in staying that she would like to have an order for a diagnostic mammogram instead of the previous mammogram mentioned? Any questions, she's available [phone].

## 2022-10-20 ENCOUNTER — Other Ambulatory Visit: Payer: Self-pay | Admitting: Family Medicine

## 2022-10-20 DIAGNOSIS — N63 Unspecified lump in unspecified breast: Secondary | ICD-10-CM

## 2022-10-20 NOTE — Telephone Encounter (Signed)
I called the patient and she stated she had a knot under her left arm near her breast. I ordered a diagnostic mammogram. Brexley Cutshaw,cma

## 2022-10-20 NOTE — Addendum Note (Signed)
Addended by: Fulton Mole D on: 10/20/2022 09:34 AM   Modules accepted: Orders

## 2022-10-22 ENCOUNTER — Other Ambulatory Visit: Payer: Self-pay | Admitting: Cardiovascular Disease

## 2022-10-22 DIAGNOSIS — E876 Hypokalemia: Secondary | ICD-10-CM

## 2022-10-22 DIAGNOSIS — I4891 Unspecified atrial fibrillation: Secondary | ICD-10-CM

## 2022-10-22 NOTE — Telephone Encounter (Signed)
Prescription refill request for Xarelto received.  Indication: CAD/PAD Last office visit: 08/19/22  Wonda Cheng NP Weight: 94.2kg Age: 62 Scr: 1.28 on 07/01/22  Epic CrCl: 66.03  Based on above findings Xarelto 2.5mg  twice daily is the appropriate dose.  Refill approved.

## 2022-10-22 NOTE — Telephone Encounter (Signed)
Please review

## 2022-10-22 NOTE — Telephone Encounter (Signed)
Refill Request.  

## 2022-10-27 DIAGNOSIS — G4710 Hypersomnia, unspecified: Secondary | ICD-10-CM | POA: Diagnosis not present

## 2022-10-27 DIAGNOSIS — G4733 Obstructive sleep apnea (adult) (pediatric): Secondary | ICD-10-CM | POA: Diagnosis not present

## 2022-10-27 DIAGNOSIS — J449 Chronic obstructive pulmonary disease, unspecified: Secondary | ICD-10-CM | POA: Diagnosis not present

## 2022-10-27 DIAGNOSIS — I259 Chronic ischemic heart disease, unspecified: Secondary | ICD-10-CM | POA: Diagnosis not present

## 2022-10-31 ENCOUNTER — Other Ambulatory Visit: Payer: Self-pay | Admitting: Family Medicine

## 2022-10-31 ENCOUNTER — Ambulatory Visit
Admission: RE | Admit: 2022-10-31 | Discharge: 2022-10-31 | Disposition: A | Discharge status: Home / Self Care | Payer: Medicare HMO | Source: Ambulatory Visit | Attending: Family Medicine | Admitting: Family Medicine

## 2022-10-31 DIAGNOSIS — R928 Other abnormal and inconclusive findings on diagnostic imaging of breast: Secondary | ICD-10-CM | POA: Diagnosis not present

## 2022-10-31 DIAGNOSIS — N6489 Other specified disorders of breast: Secondary | ICD-10-CM | POA: Diagnosis not present

## 2022-10-31 DIAGNOSIS — R92323 Mammographic fibroglandular density, bilateral breasts: Secondary | ICD-10-CM | POA: Insufficient documentation

## 2022-10-31 DIAGNOSIS — N63 Unspecified lump in unspecified breast: Secondary | ICD-10-CM

## 2022-10-31 DIAGNOSIS — Z1231 Encounter for screening mammogram for malignant neoplasm of breast: Secondary | ICD-10-CM | POA: Insufficient documentation

## 2022-10-31 DIAGNOSIS — Z1239 Encounter for other screening for malignant neoplasm of breast: Secondary | ICD-10-CM | POA: Diagnosis not present

## 2022-10-31 DIAGNOSIS — Z803 Family history of malignant neoplasm of breast: Secondary | ICD-10-CM | POA: Diagnosis not present

## 2022-11-03 DIAGNOSIS — M79671 Pain in right foot: Secondary | ICD-10-CM | POA: Diagnosis not present

## 2022-11-03 DIAGNOSIS — M249 Joint derangement, unspecified: Secondary | ICD-10-CM | POA: Diagnosis not present

## 2022-11-03 DIAGNOSIS — M79672 Pain in left foot: Secondary | ICD-10-CM | POA: Diagnosis not present

## 2022-11-03 DIAGNOSIS — M2011 Hallux valgus (acquired), right foot: Secondary | ICD-10-CM | POA: Diagnosis not present

## 2022-11-03 DIAGNOSIS — M19072 Primary osteoarthritis, left ankle and foot: Secondary | ICD-10-CM | POA: Diagnosis not present

## 2022-11-03 DIAGNOSIS — E119 Type 2 diabetes mellitus without complications: Secondary | ICD-10-CM | POA: Diagnosis not present

## 2022-11-03 DIAGNOSIS — M2012 Hallux valgus (acquired), left foot: Secondary | ICD-10-CM | POA: Diagnosis not present

## 2022-11-03 DIAGNOSIS — M19071 Primary osteoarthritis, right ankle and foot: Secondary | ICD-10-CM | POA: Diagnosis not present

## 2022-11-17 ENCOUNTER — Other Ambulatory Visit: Payer: Self-pay | Admitting: *Deleted

## 2022-11-17 MED ORDER — DAPAGLIFLOZIN PROPANEDIOL 10 MG PO TABS: 10.0000 mg | ORAL_TABLET | Freq: Every day | ORAL | 3 refills | Status: DC

## 2022-11-17 NOTE — Telephone Encounter (Signed)
Last seen by PCP on 07/01/22  Last refilled by Dr. [employer]  Received a fax requesting 100 tab with 3 refills from [facility].

## 2022-11-21 ENCOUNTER — Ambulatory Visit (INDEPENDENT_AMBULATORY_CARE_PROVIDER_SITE_OTHER): Payer: Medicare HMO | Admitting: Family Medicine

## 2022-11-21 VITALS — BP 122/70 | HR 80 | Temp 98.0°F | Resp 16 | Ht 65.0 in | Wt 203.0 lb

## 2022-11-21 DIAGNOSIS — E669 Obesity, unspecified: Secondary | ICD-10-CM

## 2022-11-21 DIAGNOSIS — Z7984 Long term (current) use of oral hypoglycemic drugs: Secondary | ICD-10-CM | POA: Diagnosis not present

## 2022-11-21 DIAGNOSIS — Z803 Family history of malignant neoplasm of breast: Secondary | ICD-10-CM | POA: Diagnosis not present

## 2022-11-21 DIAGNOSIS — E785 Hyperlipidemia, unspecified: Secondary | ICD-10-CM

## 2022-11-21 DIAGNOSIS — G629 Polyneuropathy, unspecified: Secondary | ICD-10-CM

## 2022-11-21 DIAGNOSIS — I700 Atherosclerosis of aorta: Secondary | ICD-10-CM

## 2022-11-21 DIAGNOSIS — E1169 Type 2 diabetes mellitus with other specified complication: Secondary | ICD-10-CM | POA: Diagnosis not present

## 2022-11-21 DIAGNOSIS — E119 Type 2 diabetes mellitus without complications: Secondary | ICD-10-CM

## 2022-11-21 LAB — CBC WITH DIFFERENTIAL/PLATELET
Basophils Absolute: 0.1 10*3/uL (ref 0.0–0.1)
Basophils Relative: 1.5 % (ref 0.0–3.0)
Eosinophils Absolute: 0.2 10*3/uL (ref 0.0–0.7)
Eosinophils Relative: 2.5 % (ref 0.0–5.0)
HCT: 44.5 % (ref 36.0–46.0)
Hemoglobin: 14.6 g/dL (ref 12.0–15.0)
Lymphocytes Relative: 39.8 % (ref 12.0–46.0)
Lymphs Abs: 2.4 10*3/uL (ref 0.7–4.0)
MCHC: 32.8 g/dL (ref 30.0–36.0)
MCV: 97.7 fl (ref 78.0–100.0)
Monocytes Absolute: 0.4 10*3/uL (ref 0.1–1.0)
Monocytes Relative: 6.8 % (ref 3.0–12.0)
Neutro Abs: 3 10*3/uL (ref 1.4–7.7)
Neutrophils Relative %: 49.4 % (ref 43.0–77.0)
Platelets: 161 10*3/uL (ref 150.0–400.0)
RBC: 4.56 Mil/uL (ref 3.87–5.11)
RDW: 14.1 % (ref 11.5–15.5)
WBC: 6.1 10*3/uL (ref 4.0–10.5)

## 2022-11-21 LAB — COMPREHENSIVE METABOLIC PANEL
ALT: 26 U/L (ref 0–35)
AST: 21 U/L (ref 0–37)
Albumin: 4.3 g/dL (ref 3.5–5.2)
Alkaline Phosphatase: 85 U/L (ref 39–117)
BUN: 14 mg/dL (ref 6–23)
CO2: 25 mEq/L (ref 19–32)
Calcium: 9.3 mg/dL (ref 8.4–10.5)
Chloride: 107 mEq/L (ref 96–112)
Creatinine, Ser: 1.11 mg/dL (ref 0.40–1.20)
GFR: 52.62 mL/min — ABNORMAL LOW (ref >60.00)
Glucose, Bld: 106 mg/dL — ABNORMAL HIGH (ref 70–99)
Potassium: 3.5 mEq/L (ref 3.5–5.1)
Sodium: 143 mEq/L (ref 135–145)
Total Bilirubin: 0.4 mg/dL (ref 0.2–1.2)
Total Protein: 7 g/dL (ref 6.0–8.3)

## 2022-11-21 LAB — LIPID PANEL
Cholesterol: 149 mg/dL (ref 0–200)
HDL: 45.4 mg/dL (ref >39.00)
LDL Cholesterol: 76 mg/dL (ref 0–99)
NonHDL: 103.51
Total CHOL/HDL Ratio: 3
Triglycerides: 139 mg/dL (ref 0.0–149.0)
VLDL: 27.8 mg/dL (ref 0.0–40.0)

## 2022-11-21 LAB — TSH: TSH: 1.18 u[IU]/mL (ref 0.35–5.50)

## 2022-11-21 LAB — VITAMIN B12: Vitamin B-12: 611 pg/mL (ref 211–911)

## 2022-11-21 LAB — HEMOGLOBIN A1C: Hgb A1c MFr Bld: 6.4 % (ref 4.6–6.5)

## 2022-11-21 LAB — MICROALBUMIN / CREATININE URINE RATIO
Creatinine,U: 9.6 mg/dL
Microalb Creat Ratio: 7.3 mg/g (ref 0.0–30.0)
Microalb, Ur: <0.7 mg/dL (ref 0.0–1.9)

## 2022-11-21 LAB — VITAMIN D 25 HYDROXY (VIT D DEFICIENCY, FRACTURES): VITD: 41.78 ng/mL (ref 30.00–100.00)

## 2022-11-21 NOTE — Patient Instructions (Addendum)
It was a pleasure meeting you today. Thank you for allowing me to take part in your health care.  Our goals for today as we discussed include:  Will send referral for breast risk assessment. They will call to schedule appointment  We will get some labs today.  If they are abnormal or we need to do something about them, I will call you.  If they are normal, I will send you a message on MyChart (if it is active) or a letter in the mail.  If you don't hear from [place] in 2 weeks, please call the office at the number below.   Recommend Shingles vaccine.  This is a 2 dose series and can be given at your local pharmacy.  Please talk to your pharmacist about this.   CT low dose for lung cancer screening due 02/2023  Eye exam due 02/2023    If you have any questions or concerns, please do not hesitate to call the office at [phone].  I look forward to our next visit and until then take care and stay safe.  Regards,   Dana Allan, MD   [employer]

## 2022-11-21 NOTE — Progress Notes (Signed)
SUBJECTIVE:   Chief Complaint  Patient presents with   mammogram results   HPI Presents to clinic to discuss recent mammogram results.  Had undergone bilateral diagnostic mammogram and negative for malignancy.  However given patient's family history of breast cancer was recommended formal breast cancer risk assessment.  Patient agreeable to genetics referral.    Diabetes type 2 Asymptomatic.  Currently taking Farxiga 10 mg daily.  Last A1c 6.7.  On statin and ASA therapy.  Hypertension Asymptomatic.  Compliant with current medications.  Follows with cardiology.  Peripheral neuropathy Taking Lyrica 75 mg twice daily and tizanidine 4 mg nightly.  Doing well on current medications.   PERTINENT PMH / PSH: Family history of first degree relative in mother Abnormal mammogram Hypertension Diabetes type 2  OBJECTIVE:  BP 122/70   Pulse 80   Temp 98 F (36.7 C)   Resp 16   Ht 5\' 5"  (1.651 m)   Wt 203 lb (92.1 kg)   SpO2 98%   BMI 33.78 kg/m    Physical Exam Vitals reviewed.  Constitutional:      General: She is not in acute distress.    Appearance: Normal appearance. She is obese. She is not ill-appearing, toxic-appearing or diaphoretic.  Eyes:     General:        Right eye: No discharge.        Left eye: No discharge.     Conjunctiva/sclera: Conjunctivae normal.  Cardiovascular:     Rate and Rhythm: Normal rate.  Pulmonary:     Effort: Pulmonary effort is normal.  Neurological:     Mental Status: She is alert and oriented to person, place, and time. Mental status is at baseline.  Psychiatric:        Mood and Affect: Mood normal.        Behavior: Behavior normal.        Thought Content: Thought content normal.        Judgment: Judgment normal.     ASSESSMENT/PLAN:  Family history of breast cancer in first degree relative -     Ambulatory referral to Genetics  Type 2 diabetes mellitus without complication, without long-term current use of insulin  (HCC) Assessment & Plan: Chronic.  Asymptomatic.  Last A1c 6.7.  Compliant with current medication. Continue Farxiga 10 mg daily Continue diet and exercise Check A1c On statin therapy Not currently on ACEi/ARB Eye exam due 08/24 Foot exam at next visit  Orders: -     Microalbumin / creatinine urine ratio -     Hemoglobin A1c -     Comprehensive metabolic panel  Hyperlipidemia associated with type 2 diabetes mellitus (HCC) -     Lipid panel  Aortic atherosclerosis (HCC) Assessment & Plan: Chronic.  On statin therapy and tolerating well. Continue Crestor 20 mg daily Continue Zetia 10 mg daily Check fasting lipids   Orders: -     Lipid panel  Obesity (BMI 30-39.9) Assessment & Plan: Continues to eat high carbohydrate diet. Recommended decreasing carbohydrates Increase soluble fiber Increase water intake Encouraged healthy lifestyle and increased exercise  Orders: -     CBC with Differential/Platelet -     TSH -     Vitamin B12 -     VITAMIN D 25 Hydroxy (Vit-D Deficiency, Fractures)  Sensorimotor neuropathy Assessment & Plan: Chronic. Continue Lyrica 75 mg twice daily Continue tizanidine 4 mg nightly     PDMP reviewed  Return in about 3 months (around 02/20/2023) for PCP.  Carollee Leitz, MD

## 2022-11-26 ENCOUNTER — Other Ambulatory Visit (INDEPENDENT_AMBULATORY_CARE_PROVIDER_SITE_OTHER): Payer: Self-pay | Admitting: Podiatry

## 2022-11-26 ENCOUNTER — Ambulatory Visit (INDEPENDENT_AMBULATORY_CARE_PROVIDER_SITE_OTHER): Payer: Medicare HMO

## 2022-11-26 DIAGNOSIS — Z8679 Personal history of other diseases of the circulatory system: Secondary | ICD-10-CM | POA: Diagnosis not present

## 2022-11-26 DIAGNOSIS — J449 Chronic obstructive pulmonary disease, unspecified: Secondary | ICD-10-CM | POA: Diagnosis not present

## 2022-11-27 LAB — VAS US ABI WITH/WO TBI
Left ABI: 1.19
Right ABI: 1.11

## 2022-12-02 DIAGNOSIS — N1831 Chronic kidney disease, stage 3a: Secondary | ICD-10-CM | POA: Diagnosis not present

## 2022-12-02 DIAGNOSIS — I779 Disorder of arteries and arterioles, unspecified: Secondary | ICD-10-CM | POA: Diagnosis not present

## 2022-12-02 DIAGNOSIS — E1122 Type 2 diabetes mellitus with diabetic chronic kidney disease: Secondary | ICD-10-CM | POA: Diagnosis not present

## 2022-12-02 DIAGNOSIS — K219 Gastro-esophageal reflux disease without esophagitis: Secondary | ICD-10-CM | POA: Diagnosis not present

## 2022-12-02 DIAGNOSIS — N2581 Secondary hyperparathyroidism of renal origin: Secondary | ICD-10-CM | POA: Diagnosis not present

## 2022-12-02 DIAGNOSIS — D649 Anemia, unspecified: Secondary | ICD-10-CM | POA: Diagnosis not present

## 2022-12-02 DIAGNOSIS — R609 Edema, unspecified: Secondary | ICD-10-CM | POA: Diagnosis not present

## 2022-12-02 DIAGNOSIS — I509 Heart failure, unspecified: Secondary | ICD-10-CM | POA: Diagnosis not present

## 2022-12-02 DIAGNOSIS — I2510 Atherosclerotic heart disease of native coronary artery without angina pectoris: Secondary | ICD-10-CM | POA: Diagnosis not present

## 2022-12-02 DIAGNOSIS — E785 Hyperlipidemia, unspecified: Secondary | ICD-10-CM | POA: Diagnosis not present

## 2022-12-02 DIAGNOSIS — I10 Essential (primary) hypertension: Secondary | ICD-10-CM | POA: Diagnosis not present

## 2022-12-02 DIAGNOSIS — J449 Chronic obstructive pulmonary disease, unspecified: Secondary | ICD-10-CM | POA: Diagnosis not present

## 2022-12-05 DIAGNOSIS — G4710 Hypersomnia, unspecified: Secondary | ICD-10-CM | POA: Diagnosis not present

## 2022-12-05 DIAGNOSIS — I259 Chronic ischemic heart disease, unspecified: Secondary | ICD-10-CM | POA: Diagnosis not present

## 2022-12-05 DIAGNOSIS — G4733 Obstructive sleep apnea (adult) (pediatric): Secondary | ICD-10-CM | POA: Diagnosis not present

## 2022-12-08 ENCOUNTER — Encounter: Payer: Self-pay | Admitting: Family Medicine

## 2022-12-08 DIAGNOSIS — Z803 Family history of malignant neoplasm of breast: Secondary | ICD-10-CM | POA: Insufficient documentation

## 2022-12-08 DIAGNOSIS — E669 Obesity, unspecified: Secondary | ICD-10-CM | POA: Insufficient documentation

## 2022-12-08 NOTE — Assessment & Plan Note (Signed)
Continues to eat high carbohydrate diet. Recommended decreasing carbohydrates Increase soluble fiber Increase water intake Encouraged healthy lifestyle and increased exercise 

## 2022-12-08 NOTE — Assessment & Plan Note (Signed)
Chronic.  Asymptomatic.  Last A1c 6.7.  Compliant with current medication. Continue Farxiga 10 mg daily Continue diet and exercise Check A1c On statin therapy Not currently on ACEi/ARB Eye exam due 08/24 Foot exam at next visit

## 2022-12-08 NOTE — Assessment & Plan Note (Signed)
Chronic. Continue Lyrica 75 mg twice daily Continue tizanidine 4 mg nightly

## 2022-12-08 NOTE — Assessment & Plan Note (Signed)
Chronic.  On statin therapy and tolerating well. Continue Crestor 20 mg daily Continue Zetia 10 mg daily Check fasting lipids

## 2022-12-10 ENCOUNTER — Other Ambulatory Visit: Payer: Self-pay | Admitting: Cardiovascular Disease

## 2022-12-10 ENCOUNTER — Encounter (INDEPENDENT_AMBULATORY_CARE_PROVIDER_SITE_OTHER): Payer: Medicare HMO

## 2022-12-10 DIAGNOSIS — R609 Edema, unspecified: Secondary | ICD-10-CM

## 2022-12-15 ENCOUNTER — Other Ambulatory Visit: Payer: Self-pay | Admitting: Family Medicine

## 2022-12-15 DIAGNOSIS — E785 Hyperlipidemia, unspecified: Secondary | ICD-10-CM

## 2022-12-18 DIAGNOSIS — I259 Chronic ischemic heart disease, unspecified: Secondary | ICD-10-CM | POA: Diagnosis not present

## 2022-12-18 DIAGNOSIS — G4733 Obstructive sleep apnea (adult) (pediatric): Secondary | ICD-10-CM | POA: Diagnosis not present

## 2022-12-18 DIAGNOSIS — G4710 Hypersomnia, unspecified: Secondary | ICD-10-CM | POA: Diagnosis not present

## 2022-12-27 DIAGNOSIS — J449 Chronic obstructive pulmonary disease, unspecified: Secondary | ICD-10-CM | POA: Diagnosis not present

## 2022-12-31 ENCOUNTER — Inpatient Hospital Stay: Payer: Medicare HMO | Attending: Oncology | Admitting: Licensed Clinical Social Worker

## 2022-12-31 ENCOUNTER — Inpatient Hospital Stay: Payer: Medicare HMO

## 2022-12-31 DIAGNOSIS — Z806 Family history of leukemia: Secondary | ICD-10-CM | POA: Diagnosis not present

## 2022-12-31 DIAGNOSIS — Z86010 Personal history of colonic polyps: Secondary | ICD-10-CM | POA: Diagnosis not present

## 2022-12-31 DIAGNOSIS — Z800 Family history of malignant neoplasm of digestive organs: Secondary | ICD-10-CM

## 2022-12-31 DIAGNOSIS — Z803 Family history of malignant neoplasm of breast: Secondary | ICD-10-CM

## 2022-12-31 NOTE — Progress Notes (Signed)
REFERRING PROVIDER: Dana Allan, MD [address] [place],  [place] [address]  PRIMARY PROVIDER:  Dana Allan, MD  PRIMARY REASON FOR VISIT:  1. Family history of breast cancer in first degree relative   2. Family history of colon cancer in father   3. Personal history of colonic polyps   4. Family history of leukemia      HISTORY OF PRESENT ILLNESS:   [name], a 62 y.o. female, was seen for a [facility] cancer genetics consultation at the request of Dr. Clent Ridges due to a family history of cancer.  [name] presents to clinic today to discuss the possibility of a hereditary predisposition to cancer, genetic testing, and to further clarify her future cancer risks, as well as potential cancer risks for family members.    CANCER HISTORY:  [name] is a 62 y.o. female with history of cervical cancer.  RISK FACTORS:  Menarche was at age 71.  First live birth at age 16.  Ovaries intact: yes.  Hysterectomy: no.  Colonoscopy: yes;  13 cumulative colon polyps . Mammogram within the last year: yes.  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 being followed by neurosurgery/eye doctor and pcp. monitoring size.causes sinus problems   Breast mass, right 01/14/2021   Bronchitis    Bunion of right foot 08/26/2018   Cardiac arrhythmia    CCF (congestive cardiac failure) (HCC) 06/19/2015   Cervical neck pain with evidence of disc disease    C5/6  disease, MRI done late 2012 - no records available   Chronic combined systolic and diastolic CHF (congestive heart failure) (HCC)    a. 08/2010 Echo: mildly reduced EF 40-45%, mild diffuse hypokinesis; b. 06/2016 Echo: EF 50%, no rwma, mild to mod TR; c. 03/2017 Echo: EF 40-45%, Gr1 DD (prior echo reviewed and EF felt to be lower than reported).   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 12/2018 PFT: No obvious obst/restrictive dzs.   COPD exacerbation (HCC) 03/12/2022   Coronary artery disease    a. PCI of LCX 2003; b. PCI of the LAD 2012 with a (2.5 x 8 mm BMS);  c.s/p CABG 4/12: L-LAD, VG-Dx, VG-OM, VG-RCA (Dr. Donata Clay);  d. 01/2012 MV: inf infarct, attenuation, no ischemia; e. 02/2017 MV: signifi attenuation artifact. Fixed basal antlat/inflat scar vs artifact. Reversible apical lat and mid antlat defect - ? atten vs ischemia. F/u echo w/o wma->Med rx.   Coronary artery disease  involving native coronary artery of native heart without angina pectoris 08/23/2010   Depression    Depression    Diabetes mellitus without complication [facility])    Fall 05/14/2018   Family history of colon cancer    Family history of colon cancer 06/19/2015   Gastritis without bleeding    Generalized headaches    frequent   GERD (gastroesophageal reflux disease)    Headache    Heart murmur    Hematochezia    Hematochezia 01/11/2015   Hepatitis    history of hepatitis b   History of cervical cancer    s/p cryotherapy   History of cervical cancer    s/p cryotherapy   History of colonic polyps 01/11/2015   History of drug abuse (HCC)    cocaine, marijuana, clean since 1989   History of drug abuse (HCC)    cocaine,  marijuana, clean since 1989   History of hepatitis B    from eating undercooked liver   History of MI (myocardial infarction)    HTN (hypertension) 12/20/2011   Hyperlipidemia    Hypertension    Insomnia 04/19/2020   Intractable vomiting with nausea    Intractable vomiting with nausea    Ischemic cardiomyopathy    a. 03/2017 Echo: EF 40-45%, Gr1 DD.   Lipoma of right lower extremity 08/30/2020   Mild intermittent asthma 10/13/2019   Myocardial infarction [facility]) 2003, 2012   Need for pneumococcal 20-valent conjugate vaccination 07/06/2022   Numbness and tingling in right hand 02/24/2018   Orthostatic hypotension 04/30/2015   PAD (peripheral artery disease) (HCC)    a. s/p Right SFA atherectomy and PTA 01/15/11; b. 07/2018 ABI: R 1.02, L 1.09.   Pituitary mass (HCC)    a. 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 Mass abuts R cavernous sinus w/o definite invasion.   PND (post-nasal drip) 07/24/2021   Polyp of colon    Pruritus of both eyes 09/21/2020   Right knee pain 12/20/2011   Abnormal MRI right knee 08/29/2016 f/u [facility]      FINDINGS: Medial compartment: [There is abnormal linear signal within the medial meniscus near junction of posterior horn and mid body which abuts the tibial undersurface consistent with nondisplaced tear (5:8-10). The articular cartilage, and subchondral bone are normal. 3 mm bone island (7:14).  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   Seasonal allergies    Shortness of breath 08/23/2010   Qualifier: Diagnosis of   By: Katrinka Blazing RN, Megan       Skin lesion 04/19/2021   Sleep apnea    uses cpap   Smoking history    quit 07/2010   Smoking history 12/16/2010   Quit 07/2010   Status post total knee replacement using cement, right 04/14/2019   Thrush 04/19/2021   Thyroid disease    Urinary incontinence    Urinary incontinence 12/18/2017   Vaginal itching 04/19/2021   Vasomotor flushing 11/24/2016     Past Surgical History:  Procedure Laterality Date   ABDOMINAL HYSTERECTOMY     2003   CHOLECYSTECTOMY  1986   COLONOSCOPY  2008   3 polyps   COLONOSCOPY WITH PROPOFOL N/A 02/06/2015   Procedure: COLONOSCOPY WITH PROPOFOL;  Surgeon: Midge Minium, MD;  Location: [facility] ENDOSCOPY;  Service: Endoscopy;  Laterality: N/A;   COLONOSCOPY WITH PROPOFOL N/A 01/27/2017   Procedure: COLONOSCOPY WITH PROPOFOL;  Surgeon: Midge Minium, MD;  Location: [facility] ENDOSCOPY;  Service: Endoscopy;  Laterality: N/A;   COLONOSCOPY WITH PROPOFOL N/A 07/15/2022   Procedure: COLONOSCOPY WITH PROPOFOL;  Surgeon: Toney Reil, MD;  Location[facility] ENDOSCOPY;  Service: Gastroenterology;  Laterality: N/A;   CORONARY ANGIOPLASTY     w/ stent placement x2   CORONARY ARTERY BYPASS GRAFT  2012   Dr [name]   CORONARY STENT PLACEMENT  2003   S/P MI   CORONARY STENT PLACEMENT  2007   [place]   ESOPHAGOGASTRODUODENOSCOPY (EGD) WITH PROPOFOL N/A 02/06/2015   Procedure: ESOPHAGOGASTRODUODENOSCOPY (EGD) WITH PROPOFOL;  Surgeon: Midge Minium, MD;  Location: [facility] ENDOSCOPY;  Service: Endoscopy;  Laterality: N/A;   ESOPHAGOGASTRODUODENOSCOPY (EGD) WITH PROPOFOL N/A 01/27/2017   Procedure: ESOPHAGOGASTRODUODENOSCOPY (EGD) WITH PROPOFOL;  Surgeon: Midge Minium, MD;  Location: [facility] ENDOSCOPY;  Service: Endoscopy;  Laterality: N/A;   ESOPHAGOGASTRODUODENOSCOPY (EGD) WITH PROPOFOL N/A 09/01/2019   Procedure: ESOPHAGOGASTRODUODENOSCOPY (EGD) WITH PROPOFOL;  Surgeon: Toney Reil, MD;  Location: [facility] ENDOSCOPY;  Service: Gastroenterology;  Laterality: N/A;   FEMORAL ARTERY STENT  10/2010   right sided (Dr. Excell Seltzer)   KNEE ARTHROSCOPY WITH MEDIAL MENISECTOMY Right 08/20/2017   Procedure: KNEE ARTHROSCOPY WITH MEDIAL  AND LATERAL MENISECTOMY;  Surgeon: Kennedy Bucker, MD;  Location: [facility] ORS;  Service: Orthopedics;  Laterality: Right;   POSTERIOR CERVICAL LAMINECTOMY N/A 03/08/2018   Procedure: POSTERIOR CERVICAL LAMINECTOMY-C7;  Surgeon:  Lucy Chris, MD;  Location: [facility] ORS;  Service: Neurosurgery;  Laterality: N/A;   TONSILLECTOMY     TOTAL KNEE ARTHROPLASTY Right 04/14/2019   Procedure: RIGHT TOTAL KNEE ARTHROPLASTY;  Surgeon: Kennedy Bucker, MD;  Location: [facility] ORS;  Service: Orthopedics;  Laterality: Right;   TUBAL LIGATION      FAMILY HISTORY:  We obtained a detailed, 4-generation family history.  Significant diagnoses are listed below: Family History  Problem Relation Age of Onset   Other Mother    Breast cancer Mother 34       breast cancer, late 83's   Cancer Mother        breast   Hypertension Father    Heart failure Father    Diabetes Father    Colon cancer Father 62   Glaucoma Father    Cancer Father        colorectal    Heart disease Father    Other Father        glaucoma   Brain cancer Sister    Arthritis Sister    Diabetes Sister    Hypertension Sister    Kidney disease Sister    Diabetes Brother    Hypertension Brother    Acute myelogenous leukemia Grandson        06/2019   Coronary artery disease Neg Hx    Stroke Neg Hx    [name] has 2 daughters. One of her grandsons died of AML at 64. [name] has 2 maternal half sisters, 2 paternal half sisters, no cancers.  [name]'s mother died of metastatic breast cancer at 67. Possible cancer in aunts/uncles, unsure types. Possible cancer in maternal cousins, unsure types.  [name]'s father had colon cancer at 69 and had colon polyps as well. Limited information about the rest of paternal relatives.  [name] is unaware of previous family history of genetic testing for hereditary cancer risks. There is no reported Ashkenazi Jewish ancestry. There is no known consanguinity.    GENETIC COUNSELING ASSESSMENT: [name] is a 62 y.o. female with a family history of cancer and personal history of polyps which is somewhat suggestive of  a hereditary cancer syndrome and predisposition to cancer. We, therefore, discussed and recommended the  following at today's visit.   DISCUSSION: We discussed that approximately 10% of cancer is hereditary. Most cases of hereditary breast cancer are associated with BRCA1/BRCA2 genes, although there are other genes associated with hereditary cancer as well, including genes related to colon polyps, colon cancer, and AML. Cancers and risks are gene specific. We discussed that testing is beneficial for several reasons including knowing about cancer risks, identifying potential screening and risk-reduction options that may be appropriate, and to understand if other family members could be at risk for cancer and allow them to undergo genetic testing.   We reviewed the characteristics, features and inheritance patterns of hereditary cancer syndromes. We also discussed genetic testing, including the appropriate family members to test, the process of testing, insurance coverage and turn-around-time for results. We discussed the implications of a negative, positive and/or variant of uncertain significant result. We recommended [name] pursue genetic testing for the [facility] Multi-Cancer+Hereditary Leukemia gene panel.   Based on [name]'s personal history of colon polyps and family history of cancer, she meets medical criteria for genetic testing. Despite that she meets criteria, she may still have an out of pocket cost.   PLAN: After considering the risks, benefits, and limitations, [name] provided informed consent to pursue genetic testing and the blood sample was sent to [facility] for analysis of the Multi-Cancer+Leukemia panel. Results should be available within approximately 2-3 weeks' time, at which point they will be disclosed by telephone to [name], as will any additional recommendations warranted by these results. [name] will receive a summary of her genetic counseling visit and a copy of her results once available. This information will also be available in Epic.   [name]'s  questions were answered to her satisfaction today. Our contact information was provided should additional questions or concerns arise. Thank you for the referral and allowing [place] to share in the care of your patient.   Lacy Duverney, MS, [facility] Genetic Counselor [email or web address] Phone: [phone]  The patient was seen for a total of 25 minutes in face-to-face genetic counseling.  Dr. Orlie Dakin was available for discussion regarding this case.   _______________________________________________________________________ For Office Staff:  Number of people involved in session: 2 Was an Intern/ student involved with case: yes - prospective student April was present for observation.

## 2023-01-07 ENCOUNTER — Other Ambulatory Visit: Payer: Self-pay | Admitting: *Deleted

## 2023-01-07 DIAGNOSIS — J449 Chronic obstructive pulmonary disease, unspecified: Secondary | ICD-10-CM

## 2023-01-07 MED ORDER — MONTELUKAST SODIUM 10 MG PO TABS: 10.0000 mg | ORAL_TABLET | Freq: Every day | ORAL | 3 refills | Status: DC

## 2023-01-07 MED ORDER — TRELEGY ELLIPTA 100-62.5-25 MCG/ACT IN AEPB: 1.0000 | INHALATION_SPRAY | Freq: Every day | RESPIRATORY_TRACT | 11 refills | Status: DC

## 2023-01-07 NOTE — Telephone Encounter (Signed)
Previously filled by Dr. Lonie Peak

## 2023-01-12 ENCOUNTER — Telehealth: Payer: Self-pay | Admitting: Cardiovascular Disease

## 2023-01-12 ENCOUNTER — Telehealth: Payer: Self-pay | Admitting: Licensed Clinical Social Worker

## 2023-01-12 DIAGNOSIS — E876 Hypokalemia: Secondary | ICD-10-CM

## 2023-01-12 MED ORDER — POTASSIUM CHLORIDE CRYS ER 10 MEQ PO TBCR: EXTENDED_RELEASE_TABLET | ORAL | 0 refills | Status: DC

## 2023-01-12 NOTE — Telephone Encounter (Signed)
I contacted [name] to discuss her genetic testing results. No pathogenic variants were identified in the 125 genes analyzed. Detailed clinic note to follow.   The test report has been scanned into EPIC and is located under the Molecular Pathology section of the Results Review tab.  A portion of the result report is included below for reference.      Lorraine Duverney, MS, [name] Phone: [phone]

## 2023-01-12 NOTE — Telephone Encounter (Signed)
*  STAT* If patient is at the pharmacy, call can be transferred to refill team.   1. Which medications need to be refilled? (please list name of each medication and dose if known) potassium chloride (KLOR-CON M) 10 MEQ tablet   2. Which pharmacy/location (including street and city if local pharmacy) is medication to be sent to?  [facility] - [place] [place] - [address]      3. Do they need a 30 day or 90 day supply? 90 day    Pt is completely out of medication

## 2023-01-13 ENCOUNTER — Ambulatory Visit: Payer: Self-pay | Admitting: Licensed Clinical Social Worker

## 2023-01-13 ENCOUNTER — Encounter: Payer: Self-pay | Admitting: Licensed Clinical Social Worker

## 2023-01-13 DIAGNOSIS — Z79899 Other long term (current) drug therapy: Secondary | ICD-10-CM | POA: Insufficient documentation

## 2023-01-13 DIAGNOSIS — Z1379 Encounter for other screening for genetic and chromosomal anomalies: Secondary | ICD-10-CM

## 2023-01-13 NOTE — Telephone Encounter (Signed)
Patient is calling back to makes sure that office prescribe her enough medication till her 43 says. States that she is always short on medication she never, has enough to last the full 90 days. Please advise

## 2023-01-13 NOTE — Telephone Encounter (Signed)
Not sure how the pharmacy will dispense but rx sent on 01/12/23 was for #90

## 2023-01-13 NOTE — Progress Notes (Signed)
HPI:   Ms. Duhon was previously seen in the [facility] due to a family history of cancer and concerns regarding a hereditary predisposition to cancer. Please refer to our prior cancer genetics clinic note for more information regarding our discussion, assessment and recommendations, at the time. Ms. Whidbee's recent genetic test results were disclosed to her, as were recommendations warranted by these results. These results and recommendations are discussed in more detail below.  CANCER HISTORY:  Oncology History   No history exists.    FAMILY HISTORY:  We obtained a detailed, 4-generation family history.  Significant diagnoses are listed below: Family History  Problem Relation Age of Onset   Other Mother    Breast cancer Mother 31       breast cancer, late 42's   Cancer Mother        breast   Hypertension Father    Heart failure Father    Diabetes Father    Colon cancer Father 77   Glaucoma Father    Cancer Father        colorectal    Heart disease Father    Other Father        glaucoma   Brain cancer Sister    Arthritis Sister    Diabetes Sister    Hypertension Sister    Kidney disease Sister    Diabetes Brother    Hypertension Brother    Acute myelogenous leukemia Grandson        06/2019   Coronary artery disease Neg Hx    Stroke Neg Hx    Ms. Berthiaume has 2 daughters. One of her grandsons died of AML at 20. Ms. Magdaleno has 2 maternal half sisters, 2 paternal half sisters, no cancers.   Ms. Lazaro's mother died of metastatic breast cancer at 21. Possible cancer in aunts/uncles, unsure types. Possible cancer in maternal cousins, unsure types.   Ms. Creek's father had colon cancer at 64 and had colon polyps as well. Limited information about the rest of paternal relatives.   Ms. Derks is unaware of previous family history of genetic testing for hereditary cancer risks. There is no reported Ashkenazi Jewish ancestry. There is no known  consanguinity.     GENETIC TEST RESULTS:  The [facility] Multi-Cancer+Hereditary Leukemia/Lymphoma Panel found no pathogenic mutations.    The Multi-Cancer + Hereditary Leukemia/Lymphoma panel offered by [facility] includes sequencing and/or deletion/duplication analysis of the following 125 genes: ADA, AIP, ALK, ANKRD26*, APC*, ATM*, AXIN2, BAP1, BARD1, BLM, BMPR1A, BRCA1, BRCA2, BRIP1, CARD11, CARMIL2, CASP8, CBL, CD27, CDC73, CDH1, CDK4, CDKN1B, CDKN2A (p14ARF), CDKN2A (p16INK4a), CEBPA, CHEK2, CTLA4, CTNNA1, CTPS1, DDX41, DICER1*, DOCK8, EGFR, ELANE, EPCAM*, ERCC6L2, ETV6, FADD, FAS, FASLG, FCHO1, FH*, FLCN, G6PC3, GATA2, GFI1*, GREM1*, HAX1, HOXB13, IKZF1, IL2RA, IL2RB, ITK, KIT, KRAS, LZTR1, MAGT1, MAX*, MBD4, MCM4, MECOM, MEN1*, MET*, MITF, MLH1*, MSH2*, MSH3*, MSH6*, MUTYH, NBN, NF1*, NF2, NTHL1, PALB2, PDGFRA, PIK3CD, PIK3R1, PMS2*, POLD1*, POLE, POT1, PRKAR1A, PRKCD, PTCH1, PTEN*, PTPN11, RAC2, RAD51C, RAD51D, RASGRP1, RB1*, RET, RHOH, RMRP, RTEL1, RUNX1, SAMD9, SAMD9L, SDHA*, SDHAF2, SDHB, SDHC*, SDHD, SH2D1A, SMAD4, SMARCA4, SMARCB1, SMARCE1, SRP72, STAT3, STK11, STK4, STXBP2, SUFU, TERC, TERT, TMEM127, TNFRSF13B, TP53, TPP2, TSC1*, TSC2, VHL, WAS, XIAP   The test report has been scanned into EPIC and is located under the Molecular Pathology section of the Results Review tab.  A portion of the result report is included below for reference. Genetic testing reported out on 01/09/2023.     Even though a pathogenic variant was not identified,  possible explanations for the cancer in the family may include: There may be no hereditary risk for cancer in the family. The cancers in Ms. Grow and/or her family may be sporadic/familial or due to other genetic and environmental factors. There may be a gene mutation in one of these genes that current testing methods cannot detect but that chance is small. There could be another gene that has not yet been discovered, or that we have not yet  tested, that is responsible for the cancer diagnoses in the family.  It is also possible there is a hereditary cause for the cancer in the family that Ms. Esqueda did not inherit Therefore, it is important to remain in touch with cancer genetics in the future so that we can continue to offer Ms. Pester the most up to date genetic test  ADDITIONAL GENETIC TESTING:  We discussed with Ms. Drohan that her genetic testing was fairly extensive.  If there are additional relevant genes identified to increase cancer risk that can be analyzed in the future, we would be happy to discuss and coordinate this testing at that time.    CANCER SCREENING RECOMMENDATIONS:  Ms. Loflin's test result is considered negative (normal).  This means that we have not identified a hereditary cause for her family history of cancer or personal history of polyps at this time.   An individual's cancer risk and medical management are not determined by genetic test results alone. Overall cancer risk assessment incorporates additional factors, including personal medical history, family history, and any available genetic information that may result in a personalized plan for cancer prevention and surveillance. Therefore, it is recommended she continue to follow the cancer management and screening guidelines provided by her primary healthcare provider.  Colon Cancer Screening: This negative genetic test simply tells [place] that we cannot yet define why Ms. Helgerson has had  an increased number of colorectal polyps  Ms. Miltner's medical management and screening should be based on the prospect that she will likely form more colon polyps and should, therefore, undergo more frequent colonoscopy screening at intervals determined by her GI providers.  We also recommended that Ms. Kreitz have an upper endoscopy periodically.  RECOMMENDATIONS FOR FAMILY MEMBERS:   Since she did not inherit a identifiable mutation in a cancer predisposition gene  included on this panel, her children could not have inherited a known mutation from her in one of these genes. Individuals in this family might be at some increased risk of developing cancer, over the general population risk, due to the family history of cancer.  Individuals in the family should notify their providers of the family history of cancer. We recommend women in this family have a yearly mammogram beginning at age 58, or 51 years younger than the earliest onset of cancer, an annual clinical breast exam, and perform monthly breast self-exams.  Family members should have colonoscopies by at age 56, or earlier, as recommended by their providers.  FOLLOW-UP:  Lastly, we discussed with Ms. Lager that cancer genetics is a rapidly advancing field and it is possible that new genetic tests will be appropriate for her and/or her family members in the future. We encouraged her to remain in contact with cancer genetics on an annual basis so we can update her personal and family histories and let her know of advances in cancer genetics that may benefit this family.   Our contact number was provided. Ms. Orsini's questions were answered to her satisfaction, and she knows she  is welcome to call us at anytime with additional questions or concerns.    Faith Rogue, MS, [facility] Genetic Counselor [email or web address] Phone: [phone]

## 2023-01-14 ENCOUNTER — Telehealth: Payer: Self-pay | Admitting: Cardiovascular Disease

## 2023-01-14 DIAGNOSIS — E876 Hypokalemia: Secondary | ICD-10-CM

## 2023-01-14 MED ORDER — POTASSIUM CHLORIDE CRYS ER 10 MEQ PO TBCR: EXTENDED_RELEASE_TABLET | ORAL | 3 refills | Status: DC

## 2023-01-14 NOTE — Telephone Encounter (Signed)
Pt c/o medication issue:  1. Name of Medication: potassium chloride (KLOR-CON M) 10 MEQ tablet   2. How are you currently taking this medication (dosage and times per day)?  TAKE 1 TABLET BY MOUTH ONCE DAILY AND 1 TABLET WITH FUROSEMIDE AT LUNCH 3 TIMES PER WEEK   3. Are you having a reaction (difficulty breathing--STAT)? no  4. What is your medication issue? Patient called stating she needs to have 270 pills dispensed to give her enough to have a 90 day supply so she doesn't run out of pills.  Patient would like a callback.

## 2023-01-14 NOTE — Telephone Encounter (Signed)
Spoke with patient and reviewed chart. She needs to reschedule appointment and requesting that we send 90 day supply of her medication. Updated prescription has been sent to her pharmacy and appointment has been done. No further needs at this time.

## 2023-01-14 NOTE — Telephone Encounter (Deleted)
Patient called stating she needs to have 270 pills dispensed to give her enough to have a 90 day supply so she doesn't run out of pills.

## 2023-01-22 NOTE — Progress Notes (Signed)
Cardiology Office Note  Date:  01/23/2023   ID:  [name], DOB 1961/05/29, MRN [id number]  PCP:  Dana Allan, MD   Chief Complaint  Patient presents with   6 month follow     "Doing well." Medications reviewed by the patient verbally.     HPI:  62 yo woman with a  long smoking history,   CAD s/p  NSTEMI at [facility]  1/12, with BMS to pLAD (80% stenosis),  residual severe stenosis of the RCA/nondominant small vessel,  continued chest pain,  cardiac catheterization showing distal left main stenosis involving a high grade ostial LAD stenosis and circumflex disease approximately 90%, 90% diagonal disease, 80% nondominant RCA disease with ejection fraction 45%,  CABG x 4 in 2012 with a LIMA to the LAD, vein graft to the diagonal, vein graft to the marginal and vein graft to the RCA,   peripheral vascular disease,  S/p atherectomy and PTA of her right SFA on 01/15/11,   outpatient stress test  July 2013 , OSA who presents for routine followup of her coronary artery disease  LOV 2/23 Reports feeling relatively well Taking lasix 20 BID Medicaid not covering potassium, has been taking over-the-counter potassium Sent in wrong prescription, sent in is a packet rather than a tablet  Denies significant leg swelling, weight stable No significant chest pain concerning for angina Weight stable No regular exercise program  Long history of chronic neck pain, shoulder pain, numbness into her hands  Lab Work reviewed Total cholesterol 149 LDL 76, trending higher over the past 3-4 years A1c 6.4  Echocardiogram September 2023 EF 45 to 50%, stable  EKG personally reviewed by myself on todays visit EKG Interpretation Date/Time:  Friday January 23 2023 11:10:53 EDT Ventricular Rate:  78 PR Interval:  114 QRS Duration:  88 QT Interval:  414 QTC Calculation: 471 R Axis:   10  Text Interpretation: Normal sinus rhythm ST & T wave abnormality, consider anterolateral ischemia When compared with  ECG of 11-Mar-2022 13:37, T wave inversion no longer evident in Inferior leads Confirmed by Julien Nordmann ([id number]) on 01/23/2023 11:30:45 AM     Prior  outpatient stress test  July 2013 showed significant breast attenuation artifact, inferior wall artifact from GI uptake. Overall no significant ischemia. History of chronic left arm and upper left chest  pain and was seen pain clinic. Prior diagnosis of carpal tunnel syndrome  occasional left breast pain radiating to her mediastinum.    Previously seen by ear nose throat for vestibular testing as a cause of her dizziness.  EGD and colonoscopy which by her report showed hiatal hernia. Prior problems with swallowing  She has had MRI of the neck showing cervical disc disease   Echocardiogram in the hospital showed normal LV systolic function ejection fraction 50-55%, mild MR, mild to moderate TR with right ventricular systolic pressures 30-40  PMH:   has a past medical history of Abnormal feces, Abnormality of both breasts on screening mammogram (05/14/2020), Acute esophagogastric ulcer, Allergy, Anemia, Annual physical exam (01/14/2021), Anxiety and depression (04/05/2018), Arthritis, Asthma, Atypical chest pain (01/20/2014), Back ache (06/19/2015), Bacterial vaginitis, Benign breast cyst in female, right (02/22/2020), Benign neoplasm of ascending colon, Bilateral hip pain (04/19/2020), Blood in stool, Brachial neuritis or radiculitis NOS, Brain tumor (benign) (HCC) (07/2017), Breast mass, right (01/14/2021), Bronchitis, Bunion of right foot (08/26/2018), Cardiac arrhythmia, CCF (congestive cardiac failure) (HCC) (06/19/2015), Cervical neck pain with evidence of disc disease, Chronic combined systolic and diastolic CHF (congestive heart failure) (  HCC), Chronic neck pain (12/18/2017), Chronic pain, Chronic pain of inguinal region (07/24/2021), CLAUDICATION (08/23/2010), Cocaine abuse, in remission [facility]), Colon polyp (06/19/2015), Colon polyps, COPD (chronic  obstructive pulmonary disease) (HCC), COPD exacerbation (HCC) (03/12/2022), Coronary artery disease, Coronary artery disease involving native coronary artery of native heart without angina pectoris (08/23/2010), Depression, Depression, Diabetes mellitus without complication (HCC), Fall (05/14/2018), Family history of colon cancer, Family history of colon cancer (06/19/2015), Gastritis without bleeding, Generalized headaches, GERD (gastroesophageal reflux disease), Headache, Heart murmur, Hematochezia, Hematochezia (01/11/2015), Hepatitis, History of cervical cancer, History of cervical cancer, History of colonic polyps (01/11/2015), History of drug abuse (HCC), History of drug abuse (HCC), History of hepatitis B, History of MI (myocardial infarction), HTN (hypertension) (12/20/2011), Hyperlipidemia, Hypertension, Insomnia (04/19/2020), Intractable vomiting with nausea, Intractable vomiting with nausea, Ischemic cardiomyopathy, Lipoma of right lower extremity (08/30/2020), Mild intermittent asthma (10/13/2019), Myocardial infarction (HCC) (2003, 2012), Need for pneumococcal 20-valent conjugate vaccination (07/06/2022), Numbness and tingling in right hand (02/24/2018), Orthostatic hypotension (04/30/2015), PAD (peripheral artery disease) (HCC), Pituitary mass (HCC), PND (post-nasal drip) (07/24/2021), Polyp of colon, Pruritus of both eyes (09/21/2020), Right knee pain (12/20/2011), S/P CABG x 4 (12/16/2010), Seasonal allergies, Shortness of breath (08/23/2010), Skin lesion (04/19/2021), Sleep apnea, Smoking history, Smoking history (12/16/2010), Status post total knee replacement using cement, right (04/14/2019), Thrush (04/19/2021), Thyroid disease, Urinary incontinence, Urinary incontinence (12/18/2017), Vaginal itching (04/19/2021), and Vasomotor flushing (11/24/2016).  PSH:    Past Surgical History:  Procedure Laterality Date   ABDOMINAL HYSTERECTOMY     2003   CHOLECYSTECTOMY  1986   COLONOSCOPY  2008    3 polyps   COLONOSCOPY WITH PROPOFOL N/A 02/06/2015   Procedure: COLONOSCOPY WITH PROPOFOL;  Surgeon: Midge Minium, MD;  Location: [facility] ENDOSCOPY;  Service: Endoscopy;  Laterality: N/A;   COLONOSCOPY WITH PROPOFOL N/A 01/27/2017   Procedure: COLONOSCOPY WITH PROPOFOL;  Surgeon: Midge Minium, MD;  Location[facility] ENDOSCOPY;  Service: Endoscopy;  Laterality: N/A;   COLONOSCOPY WITH PROPOFOL N/A 07/15/2022   Procedure: COLONOSCOPY WITH PROPOFOL;  Surgeon: Toney Reil, MD;  Location: [facility] ENDOSCOPY;  Service: Gastroenterology;  Laterality: N/A;   CORONARY ANGIOPLASTY     w/ stent placement x2   CORONARY ARTERY BYPASS GRAFT  2012   Dr [name]   CORONARY STENT PLACEMENT  2003   S/P MI   CORONARY STENT PLACEMENT  2007   [place]   ESOPHAGOGASTRODUODENOSCOPY (EGD) WITH PROPOFOL N/A 02/06/2015   Procedure: ESOPHAGOGASTRODUODENOSCOPY (EGD) WITH PROPOFOL;  Surgeon: Midge Minium, MD;  Location: [facility] ENDOSCOPY;  Service: Endoscopy;  Laterality: N/A;   ESOPHAGOGASTRODUODENOSCOPY (EGD) WITH PROPOFOL N/A 01/27/2017   Procedure: ESOPHAGOGASTRODUODENOSCOPY (EGD) WITH PROPOFOL;  Surgeon: Midge Minium, MD;  Location: [facility] ENDOSCOPY;  Service: Endoscopy;  Laterality: N/A;   ESOPHAGOGASTRODUODENOSCOPY (EGD) WITH PROPOFOL N/A 09/01/2019   Procedure: ESOPHAGOGASTRODUODENOSCOPY (EGD) WITH PROPOFOL;  Surgeon: Toney Reil, MD;  Location: [facility] ENDOSCOPY;  Service: Gastroenterology;  Laterality: N/A;   FEMORAL ARTERY STENT  10/2010   right sided (Dr. Excell Seltzer)   KNEE ARTHROSCOPY WITH MEDIAL MENISECTOMY Right 08/20/2017   Procedure: KNEE ARTHROSCOPY WITH MEDIAL  AND LATERAL MENISECTOMY;  Surgeon: Kennedy Bucker, MD;  Location: [facility] ORS;  Service: Orthopedics;  Laterality: Right;   POSTERIOR CERVICAL LAMINECTOMY N/A 03/08/2018   Procedure: POSTERIOR CERVICAL LAMINECTOMY-C7;  Surgeon: Lucy Chris, MD;  Location: [facility] ORS;  Service: Neurosurgery;  Laterality: N/A;   TONSILLECTOMY     TOTAL KNEE ARTHROPLASTY Right 04/14/2019    Procedure: RIGHT TOTAL KNEE ARTHROPLASTY;  Surgeon: Kennedy Bucker, MD;  Location:  [facility] ORS;  Service: Orthopedics;  Laterality: Right;   TUBAL LIGATION      Current Outpatient Medications  Medication Sig Dispense Refill   acetaminophen (TYLENOL 8 HOUR) 650 MG CR tablet Take 2 tablets (1,300 mg total) by mouth 2 (two) times daily as needed for pain. 60 tablet 1   albuterol (VENTOLIN HFA) 108 (90 Base) MCG/ACT inhaler Inhale 2 puffs into the lungs every 6 (six) hours as needed for wheezing or shortness of breath. 1 g 12   aspirin EC 81 MG tablet Take 1 tablet (81 mg total) by mouth daily. 90 tablet 3   Azelastine HCl 0.15 % SOLN Place 2 sprays into both nostrils daily as needed (allergies). 30 mL 11   benzonatate (TESSALON) 200 MG capsule Take 1 capsule (200 mg total) by mouth 3 (three) times daily as needed for cough. 30 capsule 0   clobetasol cream (TEMOVATE) 0.05 % Apply 1 Application topically 2 (two) times daily. Prn left elbow 30 g 0   Cyanocobalamin (VITAMIN B-12) 1000 MCG SUBL Place 0.001 tablets (1 mcg total) under the tongue daily. 90 tablet 3   dapagliflozin propanediol (FARXIGA) 10 MG TABS tablet Take 1 tablet (10 mg total) by mouth daily before breakfast. 100 tablet 3   dicyclomine (BENTYL) 10 MG capsule Take 1 capsule (10 mg total) by mouth 3 (three) times daily before meals. As needed for abdominal pain 120 capsule 11   esomeprazole (NEXIUM) 40 MG capsule Take 1 capsule (40 mg total) by mouth daily. 90 capsule 3   Evolocumab (REPATHA SURECLICK) 140 MG/ML SOAJ INJECT 1 DOSE SUBCUTANEOUSLY EVERY 14 DAYS 2 mL 4   ezetimibe (ZETIA) 10 MG tablet Take 1 tablet (10 mg total) by mouth daily. 90 tablet 3   fluticasone (FLONASE) 50 MCG/ACT nasal spray Place 2 sprays into both nostrils daily. Prn nasal congestion 16 g 11   Fluticasone-Umeclidin-Vilant (TRELEGY ELLIPTA) 100-62.5-25 MCG/ACT AEPB Inhale 1 puff into the lungs daily. Rinse mouth 1 each 11   furosemide (LASIX) 20 MG tablet TAKE 1  TABLET BY MOUTH ONCE DAILY AND 1 TAB AT LUNCH THREE TIMES A WEEK 136 tablet 0   GEMTESA 75 MG TABS Take 1 tablet by mouth daily.     hydrOXYzine (ATARAX) 25 MG tablet Take 1 tablet (25 mg total) by mouth daily as needed for anxiety. 60 tablet 1   ipratropium (ATROVENT) 0.06 % nasal spray Place 2 sprays into both nostrils 4 (four) times daily. 15 mL 12   ipratropium-albuterol (DUONEB) 0.5-2.5 (3) MG/3ML SOLN USE 1 VIAL IN NEBULIZER TWICE DAILY AS NEEDED FOR WHEEZING OR SHORTNESS OF BREATH 360 mL 3   levocetirizine (XYZAL) 5 MG tablet Take 1 tablet (5 mg total) by mouth at bedtime as needed for allergies. 90 tablet 3   linaclotide (LINZESS) 145 MCG CAPS capsule TAKE 1 CAPSULE BY MOUTH BEFORE BREAKFAST 90 capsule 3   montelukast (SINGULAIR) 10 MG tablet Take 1 tablet (10 mg total) by mouth at bedtime. 90 tablet 3   olopatadine (PATANOL) 0.1 % ophthalmic solution INSTILL 1 DROP INTO EACH EYE TWICE DAILY 10 mL 1   PARoxetine (PAXIL) 20 MG tablet Take 1 tablet (20 mg total) by mouth daily. 90 tablet 3   potassium chloride 20 MEQ TBCR Take 3 tablets (60 mEq total) by mouth daily. 270 tablet 4   pregabalin (LYRICA) 75 MG capsule Take 1 capsule (75 mg total) by mouth 2 (two) times daily. 60 capsule 3   rivaroxaban (XARELTO) 2.5 MG TABS  tablet Take 1 tablet by mouth twice daily 180 tablet 1   rosuvastatin (CRESTOR) 20 MG tablet TAKE 1 TABLET BY MOUTH AT BEDTIME 90 tablet 0   sodium chloride (OCEAN) 0.65 % SOLN nasal spray Place 2 sprays into both nostrils daily as needed for congestion. 30 mL 11   tiZANidine (ZANAFLEX) 4 MG tablet Take 1 tablet (4 mg total) by mouth every 8 (eight) hours as needed for muscle spasms. 30 tablet 1   EPINEPHrine 0.3 mg/0.3 mL IJ SOAJ injection Inject one pen into the thigh as needed for anaphylactic reaction. Then CALL 911. Use second pen if needed thereafter (Patient not taking: Reported on 01/23/2023) 2 each 5   isosorbide mononitrate (IMDUR) 30 MG 24 hr tablet Take 1 tablet (30  mg total) by mouth 2 (two) times daily. 180 tablet 3   metoprolol succinate (TOPROL-XL) 50 MG 24 hr tablet Take with or immediately following a meal. 90 tablet 3   nitroGLYCERIN (NITROSTAT) 0.4 MG SL tablet DISSOLVE ONE TABLET UNDER THE TONGUE EVERY 5 MINUTES AS NEEDED FOR CHEST PAIN.  DO NOT EXCEED A TOTAL OF 3 DOSES IN 15 MINUTES 25 tablet 3   OneTouch Delica Lancets 33G MISC USE TO CHECK GLUCOSE IN THE MORNING AND AT BEDTIME (Patient not taking: Reported on 01/23/2023) 200 each 0   ONETOUCH VERIO test strip USE 1 STRIP TO CHECK GLUCOSE TWICE DAILY ONCE  IN  THE  MORNING  AND  ONCE  AT  BEDTIME (Patient not taking: Reported on 01/23/2023) 200 each 0   No current facility-administered medications for this visit.    Allergies:   Citrullus vulgaris, Latex, Shellfish allergy, Sulfa antibiotics, Sulfonamide derivatives, Misc. sulfonamide containing compounds, Penicillins, Soy allergy, Tomato, and Other   Social History:  The patient  reports that she quit smoking about 12 years ago. Her smoking use included cigarettes. She has a 76.00 pack-year smoking history. She has never used smokeless tobacco. She reports that she does not drink alcohol and does not use drugs.   Family History:   family history includes Acute myelogenous leukemia in her grandson; Arthritis in her sister; Brain cancer in her sister; Breast cancer (age of onset: 71) in her mother; Cancer in her father and mother; Colon cancer (age of onset: 40) in her father; Diabetes in her brother, father, and sister; Glaucoma in her father; Heart disease in her father; Heart failure in her father; Hypertension in her brother, father, and sister; Kidney disease in her sister; Other in her father and mother.    Review of Systems: Review of Systems  Constitutional: Negative.   HENT: Negative.    Respiratory: Negative.    Cardiovascular: Negative.   Musculoskeletal:  Positive for back pain and joint pain.  Neurological: Negative.    Psychiatric/Behavioral: Negative.    All other systems reviewed and are negative.   PHYSICAL EXAM: BP 100/62 (BP Location: Left Arm, Patient Position: Sitting, Cuff Size: Normal)   Pulse 78   Ht 5\' 5"  (1.651 m)   Wt 198 lb 2 oz (89.9 kg)   SpO2 98%   BMI 32.97 kg/m  Constitutional:  oriented to person, place, and time. No distress.  HENT:  Head: Grossly normal Eyes:  no discharge. No scleral icterus.  Neck: No JVD, no carotid bruits  Cardiovascular: Regular rate and rhythm, no murmurs appreciated Pulmonary/Chest: Clear to auscultation bilaterally, no wheezes or rails Abdominal: Soft.  no distension.  no tenderness.  Musculoskeletal: Normal range of motion Neurological:  normal muscle  tone. Coordination normal. No atrophy Skin: Skin warm and dry Psychiatric: normal affect, pleasant  Recent Labs: 11/21/2022: ALT 26; BUN 14; Creatinine, Ser 1.11; Hemoglobin 14.6; Platelets 161.0; Potassium 3.5; Sodium 143; TSH 1.18    Lipid Panel Lab Results  Component Value Date   CHOL 149 11/21/2022   HDL 45.40 11/21/2022   LDLCALC 76 11/21/2022   TRIG 139.0 11/21/2022      Wt Readings from Last 3 Encounters:  01/23/23 198 lb 2 oz (89.9 kg)  11/21/22 203 lb (92.1 kg)  10/16/22 201 lb 6.4 oz (91.4 kg)     ASSESSMENT AND PLAN:   CAD/ CABG: With stable angina Currently with no symptoms of angina. No further workup at this time. Continue current medication regimen. Stable EF 45 to 50%  Ischemic cardiomyopathy Echocardiogram 45 to 50%, stable Low blood pressure limiting additional medication changes  Hyperlipidemia Cholesterol is at goal on the current lipid regimen. No changes to the medications were made.  Essential hypertension Blood pressure low, denies orthostasis symptoms  OSA on CPAP  On CPAP  COPD   stopped smoking several years ago Stable breathing, no recent COPD exacerbation  Chronic pain Chest, legs, back, arms Management per primary care  Leg  swelling Stable on Lasix 60 daily, BMP stable Refill on potassium 60 daily  Diabetes type 2 with complications We have encouraged continued exercise, careful diet management in an effort to lose weight.    Total encounter time more than 40 minutes  Greater than 50% was spent in counseling and coordination of care with the patient   Orders Placed This Encounter  Procedures   EKG 12-Lead     Signed, Dossie Arbour, M.D., Ph.D. 01/23/2023  [facility] [place], [place] [phone]

## 2023-01-23 ENCOUNTER — Encounter: Payer: Self-pay | Admitting: Cardiovascular Disease

## 2023-01-23 ENCOUNTER — Ambulatory Visit: Payer: Medicare HMO | Attending: Cardiovascular Disease | Admitting: Cardiovascular Disease

## 2023-01-23 VITALS — BP 100/62 | HR 78 | Ht 65.0 in | Wt 198.1 lb

## 2023-01-23 DIAGNOSIS — I739 Peripheral vascular disease, unspecified: Secondary | ICD-10-CM | POA: Diagnosis not present

## 2023-01-23 DIAGNOSIS — E785 Hyperlipidemia, unspecified: Secondary | ICD-10-CM | POA: Diagnosis not present

## 2023-01-23 DIAGNOSIS — G4733 Obstructive sleep apnea (adult) (pediatric): Secondary | ICD-10-CM

## 2023-01-23 DIAGNOSIS — I10 Essential (primary) hypertension: Secondary | ICD-10-CM

## 2023-01-23 DIAGNOSIS — I2510 Atherosclerotic heart disease of native coronary artery without angina pectoris: Secondary | ICD-10-CM | POA: Diagnosis not present

## 2023-01-23 DIAGNOSIS — I25118 Atherosclerotic heart disease of native coronary artery with other forms of angina pectoris: Secondary | ICD-10-CM | POA: Diagnosis not present

## 2023-01-23 DIAGNOSIS — I255 Ischemic cardiomyopathy: Secondary | ICD-10-CM | POA: Diagnosis not present

## 2023-01-23 DIAGNOSIS — I700 Atherosclerosis of aorta: Secondary | ICD-10-CM

## 2023-01-23 DIAGNOSIS — I5042 Chronic combined systolic (congestive) and diastolic (congestive) heart failure: Secondary | ICD-10-CM

## 2023-01-23 DIAGNOSIS — I5022 Chronic systolic (congestive) heart failure: Secondary | ICD-10-CM

## 2023-01-23 DIAGNOSIS — R609 Edema, unspecified: Secondary | ICD-10-CM

## 2023-01-23 MED ORDER — POTASSIUM CHLORIDE ER 20 MEQ PO TBCR: 60.0000 meq | EXTENDED_RELEASE_TABLET | Freq: Every day | ORAL | 4 refills | Status: DC

## 2023-01-23 MED ORDER — METOPROLOL SUCCINATE ER 50 MG PO TB24: ORAL_TABLET | ORAL | 3 refills | Status: DC

## 2023-01-23 MED ORDER — NITROGLYCERIN 0.4 MG SL SUBL: SUBLINGUAL_TABLET | SUBLINGUAL | 3 refills | Status: AC

## 2023-01-23 MED ORDER — ISOSORBIDE MONONITRATE ER 30 MG PO TB24: 30.0000 mg | ORAL_TABLET | Freq: Two times a day (BID) | ORAL | 3 refills | Status: DC

## 2023-01-23 NOTE — Patient Instructions (Signed)
Medication Instructions:  No changes  If you need a refill on your cardiac medications before your next appointment, please call your pharmacy.   Lab work: No new labs needed  Testing/Procedures: No new testing needed  Follow-Up: At [facility], you and your health needs are our priority.  As part of our continuing mission to provide you with exceptional heart care, we have created designated Provider Care Teams.  These Care Teams include your primary Cardiologist (physician) and Advanced Practice Providers (APPs -  Physician Assistants and Nurse Practitioners) who all work together to provide you with the care you need, when you need it.  You will need a follow up appointment in 12 months  Providers on your designated Care Team:   Christopher Berge, NP Ryan Dunn, PA-C Cadence Furth, PA-C  COVID-19 Vaccine Information can be found at: [email or web address] For questions related to vaccine distribution or appointments, please email [email or web address] or call [phone].   

## 2023-01-26 ENCOUNTER — Other Ambulatory Visit: Payer: Self-pay

## 2023-01-26 DIAGNOSIS — J449 Chronic obstructive pulmonary disease, unspecified: Secondary | ICD-10-CM

## 2023-01-26 MED ORDER — ALBUTEROL SULFATE HFA 108 (90 BASE) MCG/ACT IN AERS: 2.0000 | INHALATION_SPRAY | Freq: Four times a day (QID) | RESPIRATORY_TRACT | 12 refills | Status: DC | PRN

## 2023-01-26 NOTE — Telephone Encounter (Signed)
Previously filled by TMS 

## 2023-02-23 ENCOUNTER — Ambulatory Visit: Payer: Medicare HMO | Admitting: Family Medicine

## 2023-02-23 ENCOUNTER — Encounter: Payer: Self-pay | Admitting: Family Medicine

## 2023-02-23 VITALS — BP 100/68 | HR 63 | Temp 98.2°F | Resp 16 | Ht 65.0 in | Wt 200.0 lb

## 2023-02-23 DIAGNOSIS — S70361A Insect bite (nonvenomous), right thigh, initial encounter: Secondary | ICD-10-CM | POA: Diagnosis not present

## 2023-02-23 DIAGNOSIS — I700 Atherosclerosis of aorta: Secondary | ICD-10-CM

## 2023-02-23 DIAGNOSIS — W57XXXA Bitten or stung by nonvenomous insect and other nonvenomous arthropods, initial encounter: Secondary | ICD-10-CM

## 2023-02-23 DIAGNOSIS — J309 Allergic rhinitis, unspecified: Secondary | ICD-10-CM

## 2023-02-23 DIAGNOSIS — F419 Anxiety disorder, unspecified: Secondary | ICD-10-CM

## 2023-02-23 DIAGNOSIS — E669 Obesity, unspecified: Secondary | ICD-10-CM

## 2023-02-23 DIAGNOSIS — E1169 Type 2 diabetes mellitus with other specified complication: Secondary | ICD-10-CM | POA: Diagnosis not present

## 2023-02-23 DIAGNOSIS — Z7984 Long term (current) use of oral hypoglycemic drugs: Secondary | ICD-10-CM | POA: Diagnosis not present

## 2023-02-23 DIAGNOSIS — L309 Dermatitis, unspecified: Secondary | ICD-10-CM

## 2023-02-23 DIAGNOSIS — E785 Hyperlipidemia, unspecified: Secondary | ICD-10-CM

## 2023-02-23 DIAGNOSIS — E118 Type 2 diabetes mellitus with unspecified complications: Secondary | ICD-10-CM

## 2023-02-23 DIAGNOSIS — E119 Type 2 diabetes mellitus without complications: Secondary | ICD-10-CM

## 2023-02-23 MED ORDER — ROSUVASTATIN CALCIUM 20 MG PO TABS: 20.0000 mg | ORAL_TABLET | Freq: Every day | ORAL | 0 refills | Status: DC

## 2023-02-23 MED ORDER — MONTELUKAST SODIUM 10 MG PO TABS: 10.0000 mg | ORAL_TABLET | Freq: Every day | ORAL | 3 refills | Status: DC

## 2023-02-23 MED ORDER — HYDROCORTISONE 1 % EX CREA: TOPICAL_CREAM | Freq: Two times a day (BID) | CUTANEOUS | 0 refills | Status: DC

## 2023-02-23 MED ORDER — EZETIMIBE 10 MG PO TABS: 10.0000 mg | ORAL_TABLET | Freq: Every day | ORAL | 3 refills | Status: DC

## 2023-02-23 NOTE — Patient Instructions (Signed)
It was a pleasure meeting you today. Thank you for allowing me to take part in your health care.  Our goals for today as we discussed include:  Refills sent for requested medication  Use Hydrocortisone cream on are of thigh where insect bite for itching.  No signs of infection visible. Use warm/cool compresses as needed   Follow up in 6 months  If you have any questions or concerns, please do not hesitate to call the office at [phone].  I look forward to our next visit and until then take care and stay safe.  Regards,   Dana Allan, MD   [facility]

## 2023-02-23 NOTE — Progress Notes (Signed)
SUBJECTIVE:   Chief Complaint  Patient presents with   Medical Management of Chronic Issues   Insect Bite    Right thigh itching X 4 days    HPI Presents to clinic for follow up chronic disease management  Insect bite 1 week ago on posterior right thigh.  Unknown what type of insect.  Thinks may be something in stinger family. Reports some swelling and pain at the time but now just itching.  Had used antiseptic on area to help with controlling any possible infection.  Denies any fevers, drainage, pain currently.  Appears to be improving but wanted to have checked.  Diabetes type 2 Asymptomatic.  Compliant with Farxiga 10 mg daily.  Last A1c 6.7.  A1c due in Sept.  On statin and ASA therapy. CBG at home remain less than 150  Hypertension Asymptomatic.  Compliant with current medications and no changes. Follows with cardiology.  HLD Requesting refills for Zetia and Crestor.   PERTINENT PMH / PSH: Family history of first degree relative in mother Abnormal mammogram Hypertension Diabetes type 2  OBJECTIVE:  BP 100/68   Pulse 63   Temp 98.2 F (36.8 C)   Resp 16   Ht 5\' 5"  (1.651 m)   Wt 200 lb (90.7 kg)   SpO2 99%   BMI 33.28 kg/m    Physical Exam Vitals reviewed.  Constitutional:      General: She is not in acute distress.    Appearance: Normal appearance. She is obese. She is not ill-appearing, toxic-appearing or diaphoretic.  Eyes:     General:        Right eye: No discharge.        Left eye: No discharge.     Conjunctiva/sclera: Conjunctivae normal.  Cardiovascular:     Rate and Rhythm: Normal rate.  Pulmonary:     Effort: Pulmonary effort is normal.  Neurological:     Mental Status: She is alert and oriented to person, place, and time. Mental status is at baseline.  Psychiatric:        Mood and Affect: Mood normal.        Behavior: Behavior normal.        Thought Content: Thought content normal.        Judgment: Judgment normal.       02/23/2023    11:12 AM 10/16/2022    8:57 AM 08/15/2022    3:16 PM 06/24/2022   11:04 AM 12/25/2021    1:15 PM  Depression screen PHQ 2/9  Decreased Interest 0 1 0 2 1  Down, Depressed, Hopeless 0 0 0 3 0  PHQ - 2 Score 0 1 0 5 1  Altered sleeping 2 3 0 3   Tired, decreased energy 0 0 0 2   Change in appetite 1 0 0 2   Feeling bad or failure about yourself  0 0 0 0   Trouble concentrating 0 1 0 1   Moving slowly or fidgety/restless 0 1 0 0   Suicidal thoughts 0 0 0 0   PHQ-9 Score 3 6 0 13   Difficult doing work/chores Somewhat difficult Very difficult         ASSESSMENT/PLAN:  Type 2 diabetes mellitus with complications (HCC) Assessment & Plan: Chronic.  Asymptomatic.  Last A1c 6.7.  Compliant with current medication. Continue Farxiga 10 mg daily Continue diet and exercise Check A1c at next visit On statin therapy Not currently on ACEi/ARB.  Soft BP Eye exam due 08/24  Foot exam completed   Hyperlipidemia associated with type 2 diabetes mellitus (HCC) Assessment & Plan: Chronic.  Refill Zetia 10 mg daily Refill Crestor 20 mg daily   Orders: -     Ezetimibe; Take 1 tablet (10 mg total) by mouth daily.  Dispense: 90 tablet; Refill: 3 -     Rosuvastatin Calcium; Take 1 tablet (20 mg total) by mouth at bedtime.  Dispense: 90 tablet; Refill: 0  Aortic atherosclerosis (HCC)  Obesity (BMI 30-39.9)  Eczema, unspecified type Assessment & Plan: Refill Hydrocortisone  Orders: -     Hydrocortisone; Apply topically 2 (two) times daily.  Dispense: 30 g; Refill: 0  Insect bite of right thigh, initial encounter Assessment & Plan: No signs of infection Healing area Suspect hypersensitivity reaction to remnants of insect remains/sting Can try hydrocortisone cream BID as needed for itching.  Follow up if no improvement  Orders: -     Hydrocortisone; Apply topically 2 (two) times daily.  Dispense: 30 g; Refill: 0  Allergic rhinitis, unspecified seasonality, unspecified  trigger Assessment & Plan: Refill Montelukast  Orders: -     Montelukast Sodium; Take 1 tablet (10 mg total) by mouth at bedtime.  Dispense: 90 tablet; Refill: 3   PDMP reviewed  Return in about 6 months (around 08/26/2023), or if symptoms worsen or fail to improve, for PCP.  Dana Allan, MD

## 2023-02-26 DIAGNOSIS — J449 Chronic obstructive pulmonary disease, unspecified: Secondary | ICD-10-CM | POA: Diagnosis not present

## 2023-03-06 ENCOUNTER — Other Ambulatory Visit: Payer: Self-pay | Admitting: Cardiovascular Disease

## 2023-03-06 DIAGNOSIS — R609 Edema, unspecified: Secondary | ICD-10-CM

## 2023-03-07 ENCOUNTER — Encounter: Payer: Self-pay | Admitting: Family Medicine

## 2023-03-07 DIAGNOSIS — S70361A Insect bite (nonvenomous), right thigh, initial encounter: Secondary | ICD-10-CM | POA: Insufficient documentation

## 2023-03-07 DIAGNOSIS — L309 Dermatitis, unspecified: Secondary | ICD-10-CM | POA: Insufficient documentation

## 2023-03-07 DIAGNOSIS — W57XXXA Bitten or stung by nonvenomous insect and other nonvenomous arthropods, initial encounter: Secondary | ICD-10-CM | POA: Insufficient documentation

## 2023-03-07 NOTE — Assessment & Plan Note (Signed)
Refill Hydrocortisone

## 2023-03-07 NOTE — Assessment & Plan Note (Signed)
Chronic.  Refill Zetia 10 mg daily Refill Crestor 20 mg daily

## 2023-03-07 NOTE — Assessment & Plan Note (Signed)
Refill Montelukast.

## 2023-03-07 NOTE — Assessment & Plan Note (Signed)
No signs of infection Healing area Suspect hypersensitivity reaction to remnants of insect remains/sting Can try hydrocortisone cream BID as needed for itching.  Follow up if no improvement

## 2023-03-07 NOTE — Assessment & Plan Note (Signed)
Chronic.  Asymptomatic.  Last A1c 6.7.  Compliant with current medication. Continue Farxiga 10 mg daily Continue diet and exercise Check A1c at next visit On statin therapy Not currently on ACEi/ARB.  Soft BP Eye exam due 08/24 Foot exam completed

## 2023-03-09 ENCOUNTER — Other Ambulatory Visit: Payer: Self-pay

## 2023-03-17 ENCOUNTER — Ambulatory Visit: Payer: Medicare HMO | Admitting: Gastroenterology

## 2023-03-29 DIAGNOSIS — J449 Chronic obstructive pulmonary disease, unspecified: Secondary | ICD-10-CM | POA: Diagnosis not present

## 2023-03-30 ENCOUNTER — Other Ambulatory Visit: Payer: Self-pay | Admitting: Family Medicine

## 2023-03-30 DIAGNOSIS — G629 Polyneuropathy, unspecified: Secondary | ICD-10-CM

## 2023-03-31 NOTE — Telephone Encounter (Signed)
LOV: 02/23/2023   NOV: 04/23/2023

## 2023-04-04 DIAGNOSIS — M1990 Unspecified osteoarthritis, unspecified site: Secondary | ICD-10-CM | POA: Diagnosis not present

## 2023-04-04 DIAGNOSIS — F419 Anxiety disorder, unspecified: Secondary | ICD-10-CM | POA: Diagnosis not present

## 2023-04-04 DIAGNOSIS — H409 Unspecified glaucoma: Secondary | ICD-10-CM | POA: Diagnosis not present

## 2023-04-04 DIAGNOSIS — M62838 Other muscle spasm: Secondary | ICD-10-CM | POA: Diagnosis not present

## 2023-04-04 DIAGNOSIS — E669 Obesity, unspecified: Secondary | ICD-10-CM | POA: Diagnosis not present

## 2023-04-04 DIAGNOSIS — K219 Gastro-esophageal reflux disease without esophagitis: Secondary | ICD-10-CM | POA: Diagnosis not present

## 2023-04-04 DIAGNOSIS — M4800 Spinal stenosis, site unspecified: Secondary | ICD-10-CM | POA: Diagnosis not present

## 2023-04-04 DIAGNOSIS — Z79899 Other long term (current) drug therapy: Secondary | ICD-10-CM | POA: Diagnosis not present

## 2023-04-04 DIAGNOSIS — E785 Hyperlipidemia, unspecified: Secondary | ICD-10-CM | POA: Diagnosis not present

## 2023-04-04 DIAGNOSIS — I252 Old myocardial infarction: Secondary | ICD-10-CM | POA: Diagnosis not present

## 2023-04-20 ENCOUNTER — Other Ambulatory Visit: Payer: Self-pay | Admitting: Cardiovascular Disease

## 2023-04-20 DIAGNOSIS — I4891 Unspecified atrial fibrillation: Secondary | ICD-10-CM

## 2023-04-20 NOTE — Telephone Encounter (Signed)
Refill Request.

## 2023-04-20 NOTE — Telephone Encounter (Signed)
Prescription refill request for Xarelto received.  Indication: PAD Last office visit: 01/23/23 [name])  Weight: 90.7kg Age: 62 Scr: 1.11 (11/21/22)  CrCl: 73.77ml/min  Appropriate dose. Refill sent.

## 2023-04-21 ENCOUNTER — Other Ambulatory Visit: Payer: Self-pay

## 2023-04-21 DIAGNOSIS — J309 Allergic rhinitis, unspecified: Secondary | ICD-10-CM

## 2023-04-21 DIAGNOSIS — J0111 Acute recurrent frontal sinusitis: Secondary | ICD-10-CM

## 2023-04-21 MED ORDER — FLUTICASONE PROPIONATE 50 MCG/ACT NA SUSP: 2.0000 | Freq: Every day | NASAL | 11 refills | Status: DC

## 2023-04-23 ENCOUNTER — Telehealth: Payer: Self-pay | Admitting: *Deleted

## 2023-04-23 ENCOUNTER — Telehealth: Payer: Medicare HMO | Admitting: Primary Care

## 2023-04-23 DIAGNOSIS — R0982 Postnasal drip: Secondary | ICD-10-CM

## 2023-04-23 DIAGNOSIS — J4550 Severe persistent asthma, uncomplicated: Secondary | ICD-10-CM | POA: Diagnosis not present

## 2023-04-23 DIAGNOSIS — J324 Chronic pansinusitis: Secondary | ICD-10-CM

## 2023-04-23 DIAGNOSIS — R0981 Nasal congestion: Secondary | ICD-10-CM

## 2023-04-23 DIAGNOSIS — G4733 Obstructive sleep apnea (adult) (pediatric): Secondary | ICD-10-CM | POA: Diagnosis not present

## 2023-04-23 MED ORDER — AEROCHAMBER MV MISC: 0 refills | Status: DC

## 2023-04-23 MED ORDER — SALINE SPRAY 0.65 % NA SOLN: 2.0000 | Freq: Every day | NASAL | 11 refills | Status: DC | PRN

## 2023-04-23 MED ORDER — PREDNISONE 10 MG PO TABS: ORAL_TABLET | ORAL | 0 refills | Status: DC

## 2023-04-23 MED ORDER — BENZONATATE 200 MG PO CAPS
200.0000 mg | ORAL_CAPSULE | Freq: Three times a day (TID) | ORAL | 0 refills | Status: DC | PRN
Start: 2023-04-23 — End: 2023-06-12

## 2023-04-23 NOTE — Progress Notes (Signed)
Reviewed and agree with assessment/plan.   Coralyn Helling, MD [facility] Pulmonary/Critical Care 04/23/2023, 7:36 PM Pager:  [phone]

## 2023-04-23 NOTE — Telephone Encounter (Signed)
Left message for patient to call our office to schedule a follow up OV with Ames Dura, NP in 4-6 wks.  (Okay to double book appt. With a virtual ov or Shared Decision ov. Please only double book on a day that does not already have another double book ov.)

## 2023-04-23 NOTE — Progress Notes (Signed)
Virtual Visit via Video Note  I connected with [name] on 04/23/23 at 10:00 AM EDT by a video enabled telemedicine application and verified that I am speaking with the correct person using two identifiers.  Location: Patient: Home Provider: Office   I discussed the limitations of evaluation and management by telemedicine and the availability of in person appointments. The patient expressed understanding and agreed to proceed.  History of Present Illness: 62 year old female, warmer smoker quit in 2012.  Past medical history significant for hypertension, heart failure, asthma/COPD, OSA on CPAP, allergic rhinitis, eczema, fatty liver, GERD, hyperlipidemia, type 2 diabetes, Vit D deficiency.    04/23/2023 Patient contacted today for overdue follow-up for severe persistent asthma.  During her last office visit with Dr. Craige Cotta in August 2023 she was started on Xolair.  Patient received EpiPen however did not receive training and subsequently was never started on biologic.  She tells me her asthma symptoms have been poorly controlled over the last year.  She is compliant with Trelegy.  Uses albuterol 2-3 times a week.  She has been unable to use CPAP due to nasal congestion.  Taking Xyzal all and Flonase daily.  She needs a refill of ocean nasal spray.  Observations/Objective:  Appears well; No overt shortness of breath, wheezing or cough   Assessment and Plan:  Severe persistent asthma: - Poorly controled / allergic phenotype  - Continue Trelegy one puff daily and prn albuterol  - Sending in prednisone taper for acute exacerbation, refill tesslon perles 200mg  TID prn cough and RX for aerochamber to use with HFA - CBC/IgE 2 weeks after completing prednisone - FU in 4-6 weeks in person visit to discuss starting biologics   Allergic rhinitis/acute sinusitis:  -No evidence of bacterial infection  -Continue Xyzal, singular and Flonase -Resume Ocean saline nasal spray twice  daily  OSA: - Advised patient resume CPAP use once acute sinusitis symptoms have resolved - DME [employer], order placed to renew CPAP supplies    Follow Up Instructions:  4-6 weeks with Waynetta Sandy NP, labs prior    I discussed the assessment and treatment plan with the patient. The patient was provided an opportunity to ask questions and all were answered. The patient agreed with the plan and demonstrated an understanding of the instructions.   The patient was advised to call back or seek an in-person evaluation if the symptoms worsen or if the condition fails to improve as anticipated.  I provided 22 minutes of non-face-to-face time during this encounter.   Glenford Bayley, NP

## 2023-04-23 NOTE — Patient Instructions (Signed)
  Take prednisone as directed Continue Trelegy 1 puff daily, Xyzal, Flonase Resume Ocean saline nasal spray twice daily Resume CPAP when able  Get labs done 2-3 days prior to visit FU 4-6 weeks

## 2023-04-29 DIAGNOSIS — G4710 Hypersomnia, unspecified: Secondary | ICD-10-CM | POA: Diagnosis not present

## 2023-04-29 DIAGNOSIS — G4733 Obstructive sleep apnea (adult) (pediatric): Secondary | ICD-10-CM | POA: Diagnosis not present

## 2023-04-29 DIAGNOSIS — I259 Chronic ischemic heart disease, unspecified: Secondary | ICD-10-CM | POA: Diagnosis not present

## 2023-05-12 ENCOUNTER — Other Ambulatory Visit: Payer: Self-pay

## 2023-05-12 DIAGNOSIS — F419 Anxiety disorder, unspecified: Secondary | ICD-10-CM

## 2023-05-13 ENCOUNTER — Other Ambulatory Visit: Payer: Self-pay

## 2023-05-13 MED ORDER — PAROXETINE HCL 20 MG PO TABS
20.0000 mg | ORAL_TABLET | Freq: Every day | ORAL | 3 refills | Status: DC
Start: 2023-05-13 — End: 2024-03-22

## 2023-05-25 ENCOUNTER — Other Ambulatory Visit: Payer: Self-pay | Admitting: Family Medicine

## 2023-05-25 DIAGNOSIS — G629 Polyneuropathy, unspecified: Secondary | ICD-10-CM

## 2023-05-26 ENCOUNTER — Other Ambulatory Visit: Payer: Self-pay | Admitting: Family Medicine

## 2023-05-26 ENCOUNTER — Other Ambulatory Visit: Payer: Self-pay

## 2023-05-26 DIAGNOSIS — G629 Polyneuropathy, unspecified: Secondary | ICD-10-CM

## 2023-05-27 NOTE — Telephone Encounter (Signed)
Refilled: 03/31/2023 Last OV: 02/23/2023 Next OV: 08/26/2023

## 2023-05-28 DIAGNOSIS — E119 Type 2 diabetes mellitus without complications: Secondary | ICD-10-CM | POA: Diagnosis not present

## 2023-05-28 DIAGNOSIS — D352 Benign neoplasm of pituitary gland: Secondary | ICD-10-CM | POA: Diagnosis not present

## 2023-05-28 DIAGNOSIS — H40003 Preglaucoma, unspecified, bilateral: Secondary | ICD-10-CM | POA: Diagnosis not present

## 2023-05-28 DIAGNOSIS — H2513 Age-related nuclear cataract, bilateral: Secondary | ICD-10-CM | POA: Diagnosis not present

## 2023-05-28 LAB — HM DIABETES EYE EXAM

## 2023-06-02 ENCOUNTER — Encounter: Payer: Self-pay | Admitting: Gastroenterology

## 2023-06-02 ENCOUNTER — Ambulatory Visit (INDEPENDENT_AMBULATORY_CARE_PROVIDER_SITE_OTHER): Payer: Medicare HMO | Admitting: Gastroenterology

## 2023-06-02 VITALS — BP 124/78 | HR 80 | Temp 97.4°F | Ht 65.0 in | Wt 204.2 lb

## 2023-06-02 DIAGNOSIS — R1013 Epigastric pain: Secondary | ICD-10-CM | POA: Diagnosis not present

## 2023-06-02 DIAGNOSIS — K5909 Other constipation: Secondary | ICD-10-CM

## 2023-06-02 DIAGNOSIS — Z860101 Personal history of adenomatous and serrated colon polyps: Secondary | ICD-10-CM | POA: Diagnosis not present

## 2023-06-02 DIAGNOSIS — R0989 Other specified symptoms and signs involving the circulatory and respiratory systems: Secondary | ICD-10-CM | POA: Diagnosis not present

## 2023-06-02 DIAGNOSIS — Z8719 Personal history of other diseases of the digestive system: Secondary | ICD-10-CM

## 2023-06-02 NOTE — Progress Notes (Signed)
Arlyss Repress, MD [address]  [address]  [place], [place] [address]  Main: [phone]  Fax: [phone]    Gastroenterology Consultation  Referring Provider:     Dana Allan, MD Primary Care Physician:  Dana Allan, MD Primary Gastroenterologist:  Dr. Arlyss Repress Reason for Consultation: chronic constipation, abdominal bloating, early satiety        HPI:   [name] is a 62 y.o. female referred by Dr. Dana Allan, MD  for consultation & management of diffuse quadrant pain, chronic constipation.  Patient has history of chronic constipation almost all her life and fecal impaction at age of 43.  Patient saw Dr. Judie Grieve on 08/16/2019 secondary to severe left sided abdominal pain associated with nausea.  She was started on MiraLAX, Bentyl as well as empirically treated for acute sigmoid diverticulitis with 10 days course of Cipro and Flagyl.  Patient reports that she finished antibiotics however, pain persisted and she feels like something stuck in her left upper quadrant and has to come out, pain is predominantly in the left upper quadrant radiating to the back and lower abdomen.  She denies fever, chills.  She does feel nauseous, denies vomiting.  She denies abdominal bloating, rectal bleeding.  She reports her stools are watery, has tried enema and suppository this does not work. She does not smoke or drink alcohol Known history of sigmoid diverticulosis  Follow-up video visit 09/07/2019 I initially saw [name] on 08/25/2019 due to severe left lower quadrant pain, severe constipation and she was empirically being treated for acute sigmoid diverticulitis.  Repeat CT scan did not reveal diverticulitis.  Upper endoscopy was unremarkable as well.  I treated her constipation with GoLYTELY followed by Linzess 145 MCG and high-fiber diet with fiber supplements.  She reports that she is responding very well to Linzess and fiber supplements.  She is taking Linzess every  day and her bowel movements are now formed.  She reports that her left lower quadrant pain is gradually improving.  She is very pleased with the way her symptoms are improving.  She drinks sodas daily and eats red meat regularly.  Follow-up visit 11/15/2019 I started her on Linzess 145 MCG daily which worked very well initially, however she was having 4-5 loose bowel movements daily.  So, her PCP switched to Linzess 72 MCG daily.  She is currently having 2 bowel movements daily.  She does have mild left lower quadrant discomfort.  She has admitted drinking sodas.  Currently drinking more water.  She has been stressed out recently as her grandson passed away from AML at age 105.  She is currently going through grief response and worried about her daughter.  She does report intermittent epigastric discomfort and frequent belching.  She is currently on Nexium 40 mg daily.  Her weight has been stable  Follow-up visit 04/17/2020 Patient reports doing well overall.  Linzess 72 MCG is working well for her.  She reports mild left-sided discomfort.  She is trying to adopt healthy lifestyle.  Her sister who is on dialysis lives with her.  She does not have any other concerns today.  Follow-up visit 07/10/2022 Patient has not seen me for 2 years.  She was reached out by our office to schedule screening colonoscopy and patient reported upper GI symptoms including abdominal bloating, upper abdominal fullness after taking 4 bites of a meal, decreased appetite.  This has been ongoing for almost a year.  She also developed severe constipation.  She stopped taking Linzess 72 mcg on her own because she felt her bowel movements are fine on Linzess.  She reached out to her PCP who restarted 72 mcg which did not work.  This was about 2 weeks ago.  Currently taking 145 mcg, started about a week ago.  She is now having too many bowel movements on higher dose and therefore stopped taking it in last 2 days.  Having irregular bowel  movements, about once a week only.  She is scheduled to undergo colonoscopy on 12/19.  She continues to take Nexium 40 mg once a day before breakfast.  She underwent CT abdomen pelvis yesterday, report is pending  Follow-up visit 06/02/2023 [name] is here for follow-up of chronic constipation and symptoms of dyspepsia.  She underwent gastric emptying study which was normal.  She also underwent CT abdomen and pelvis with contrast which was unremarkable.  She is taking Linzess 145 mcg daily to keep her bowels regular.  She is taking Nexium 40 mg daily before breakfast for chronic GERD.  She reports feeling full, discomfort in epigastric area and bloating, sensation of food stuck in her lower chest and she has to stand up to avoid regurgitation of food.  She does not smoke or drink alcohol   NSAIDs: None   Antiplts/Anticoagulants/Anti thrombotics: None   GI Procedures:  Colonoscopy 07/15/2022 - One 6 mm polyp in the transverse colon, removed with a cold snare. Resected and retrieved. Clips ( MR conditional) were placed. Clip manufacturer: [employer]. - Four 3 to 4 mm polyps in the transverse colon, removed with a cold snare. Resected and retrieved. - Four 4 to 5 mm polyps in the descending colon, removed with a cold snare. Resected and retrieved. - The distal rectum and anal verge are normal on retroflexion view. - The examination was otherwise normal.  DIAGNOSIS:  A. COLON POLYPS X 5, TRANSVERSE; COLD SNARE:  - MULTIPLE FRAGMENTS OF TUBULAR ADENOMAS.  - NEGATIVE FOR HIGH-GRADE DYSPLASIA AND MALIGNANCY.   B. COLON POLYPS X 4, DESCENDING; COLD SNARE:  - FRAGMENTS (X 3) OF TUBULAR ADENOMAS.  - NEGATIVE FOR HIGH-GRADE DYSPLASIA AND MALIGNANCY.    EGD 09/01/2019 - Normal duodenal bulb and second portion of the duodenum. - Small hiatal hernia. - Normal stomach. - Normal gastroesophageal junction and esophagus. - No specimens collected.   Antiplts/Anticoagulants/Anti thrombotics:  None   EGD and colonoscopy in 2018 by Dr. Servando Snare - Small hiatal hernia. - Non-bleeding gastric ulcers with no stigmata of bleeding. Biopsied. - Gastritis. Biopsied. - Normal examined duodenum.   - Two 2 to 4 mm polyps in the sigmoid colon, removed with a cold snare. Resected and retrieved. - One 4 mm polyp in the ascending colon, removed with a cold snare. Resected and retrieved. - Diverticulosis in the sigmoid colon. - Non-bleeding internal hemorrhoids.   DIAGNOSIS:  A. STOMACH ULCERATION; COLD BIOPSY:  - MILD ACTIVE GASTRITIS WITH ULCERATION.  - NEGATIVE FOR DYSPLASIA AND MALIGNANCY.   ADDENDUM:  Immunohistochemical stain for H. pylori is negative with an appropriate  control.   B.  COLON POLYP, ASCENDING; COLD SNARE:  - TUBULAR ADENOMA.  - NEGATIVE FOR HIGH-GRADE DYSPLASIA AND MALIGNANCY.   C. COLON POLYP 2, SIGMOID; COLD SNARE:  - TUBULAR ADENOMAS (2).  - NEGATIVE FOR HIGH-GRADE DYSPLASIA AND MALIGNANCY.    EGD and colonoscopy in 2016 by Dr. Servando Snare   DIAGNOSIS:  A. COLON POLYP, ASCENDING; HOT SNARE:  - TUBULAR ADENOMA, 1.0 CM FRAGMENT AND SEVERAL SMALLER FRAGMENTS.  -  NEGATIVE FOR HIGH-GRADE DYSPLASIA AND MALIGNANCY.  EGD and colonoscopy in 2018 by Dr. Servando Snare - Small hiatal hernia. - Non-bleeding gastric ulcers with no stigmata of bleeding. Biopsied. - Gastritis. Biopsied. - Normal examined duodenum.  - Two 2 to 4 mm polyps in the sigmoid colon, removed with a cold snare. Resected and retrieved. - One 4 mm polyp in the ascending colon, removed with a cold snare. Resected and retrieved. - Diverticulosis in the sigmoid colon. - Non-bleeding internal hemorrhoids.  DIAGNOSIS:  A. STOMACH ULCERATION; COLD BIOPSY:  - MILD ACTIVE GASTRITIS WITH ULCERATION.  - NEGATIVE FOR DYSPLASIA AND MALIGNANCY.   ADDENDUM:  Immunohistochemical stain for H. pylori is negative with an appropriate  control.   B.  COLON POLYP, ASCENDING; COLD SNARE:  - TUBULAR ADENOMA.  - NEGATIVE FOR  HIGH-GRADE DYSPLASIA AND MALIGNANCY.   C. COLON POLYP 2, SIGMOID; COLD SNARE:  - TUBULAR ADENOMAS (2).  - NEGATIVE FOR HIGH-GRADE DYSPLASIA AND MALIGNANCY.   EGD and colonoscopy in 2016 by Dr. Servando Snare  DIAGNOSIS:  A. COLON POLYP, ASCENDING; HOT SNARE:  - TUBULAR ADENOMA, 1.0 CM FRAGMENT AND SEVERAL SMALLER FRAGMENTS.  - NEGATIVE FOR HIGH-GRADE DYSPLASIA AND MALIGNANCY.   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 being followed by neurosurgery/eye doctor and pcp. monitoring size.causes sinus problems   Breast mass, right 01/14/2021   Bronchitis    Bunion of right foot 08/26/2018   Cardiac arrhythmia    CCF (congestive cardiac failure) (HCC) 06/19/2015   Cervical neck pain with evidence of disc disease    C5/6 disease, MRI done late 2012 - no records available   Chronic combined systolic and diastolic CHF (congestive heart failure) (HCC)    a. 08/2010 Echo: mildly reduced EF 40-45%, mild diffuse hypokinesis; b. 06/2016 Echo: EF 50%, no rwma, mild to mod TR; c. 03/2017 Echo: EF 40-45%, Gr1 DD (prior echo reviewed and EF felt to be lower than reported).   Chronic neck pain 12/18/2017   Chronic pain    Chronic pain of inguinal region 07/24/2021   CLAUDICATION 08/23/2010   Qualifier: Diagnosis of   By: Katrinka Blazing RN, Megan       Cocaine abuse, in remission (HCC)    clean x 24 years   Colon polyp 06/19/2015   2010   Colon polyps    COPD (chronic  obstructive pulmonary disease) (HCC)    a. 12/2018 PFT: No obvious obst/restrictive dzs.   COPD exacerbation (HCC) 03/12/2022   Coronary artery disease    a. PCI of LCX 2003; b. PCI of the LAD 2012 with a (2.5 x 8 mm BMS);  c.s/p CABG 4/12: L-LAD, VG-Dx, VG-OM, VG-RCA (Dr. Donata Clay);  d. 01/2012 MV: inf infarct, attenuation, no ischemia; e. 02/2017 MV: signifi attenuation artifact. Fixed basal antlat/inflat scar vs artifact. Reversible apical lat and mid antlat defect - ?  atten vs ischemia. F/u echo w/o wma->Med rx.   Coronary artery disease involving native coronary artery of native heart without angina pectoris 08/23/2010   Depression    Depression    Diabetes mellitus without complication [facility])    Fall 05/14/2018   Family history of colon cancer    Family history of colon cancer 06/19/2015   Gastritis without bleeding    Generalized headaches    frequent   GERD (gastroesophageal reflux disease)    Headache    Heart murmur    Hematochezia    Hematochezia 01/11/2015   Hepatitis    history of hepatitis b   History of cervical cancer    s/p cryotherapy   History of cervical cancer    s/p cryotherapy   History of colonic polyps 01/11/2015   History of drug abuse (HCC)    cocaine, marijuana, clean since 1989   History of drug abuse (HCC)    cocaine, marijuana, clean since 1989   History of hepatitis B    from eating undercooked liver   History of MI (myocardial infarction)    HTN (hypertension) 12/20/2011   Hyperlipidemia    Hypertension    Insomnia 04/19/2020   Intractable vomiting with nausea    Intractable vomiting with nausea    Ischemic cardiomyopathy    a. 03/2017 Echo: EF 40-45%, Gr1 DD.   Lipoma of right lower extremity 08/30/2020   Mild intermittent asthma 10/13/2019   Myocardial infarction [facility]) 2003, 2012   Need for pneumococcal 20-valent conjugate vaccination 07/06/2022   Numbness and tingling in right hand 02/24/2018   Orthostatic hypotension 04/30/2015   PAD  (peripheral artery disease) (HCC)    a. s/p Right SFA atherectomy and PTA 01/15/11; b. 07/2018 ABI: R 1.02, L 1.09.   Pituitary mass (HCC)    a. 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 Mass abuts R cavernous sinus w/o definite invasion.   PND (post-nasal drip) 07/24/2021   Polyp of colon    Pruritus of both eyes 09/21/2020   Right knee pain 12/20/2011   Abnormal MRI right knee 08/29/2016 f/u [facility]      FINDINGS: Medial compartment: [There is abnormal linear signal within the medial meniscus near junction of posterior horn and mid body which abuts the tibial undersurface consistent with nondisplaced tear (5:8-10). The articular cartilage, and subchondral bone are normal. 3 mm bone island (7:14).  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   Seasonal allergies    Shortness of breath 08/23/2010   Qualifier: Diagnosis of   By: Katrinka Blazing RN, Megan       Skin lesion 04/19/2021   Sleep apnea    uses cpap   Smoking history    quit 07/2010   Smoking history 12/16/2010   Quit 07/2010   Status post total knee replacement using cement, right 04/14/2019   Thrush 04/19/2021   Thyroid disease    Urinary incontinence    Urinary incontinence 12/18/2017   Vaginal itching 04/19/2021   Vasomotor flushing 11/24/2016    Past Surgical History:  Procedure Laterality Date   ABDOMINAL HYSTERECTOMY     2003   CHOLECYSTECTOMY  1986   COLONOSCOPY  2008   3 polyps   COLONOSCOPY WITH PROPOFOL N/A 02/06/2015   Procedure: COLONOSCOPY WITH PROPOFOL;  Surgeon: Midge Minium, MD;  Location: [facility] ENDOSCOPY;  Service: Endoscopy;  Laterality: N/A;   COLONOSCOPY WITH PROPOFOL N/A 01/27/2017   Procedure: COLONOSCOPY WITH PROPOFOL;  Surgeon: Midge Minium, MD;  Location: [facility] ENDOSCOPY;  Service: Endoscopy;  Laterality: N/A;   COLONOSCOPY WITH PROPOFOL N/A 07/15/2022   Procedure: COLONOSCOPY WITH PROPOFOL;  Surgeon: Toney Reil, MD;  Location: [facility] ENDOSCOPY;   Service: Gastroenterology;  Laterality: N/A;   CORONARY ANGIOPLASTY     w/ stent placement x2   CORONARY ARTERY BYPASS GRAFT  2012   Dr [name]   CORONARY STENT PLACEMENT  2003   S/P MI   CORONARY STENT PLACEMENT  2007   [place]   ESOPHAGOGASTRODUODENOSCOPY (EGD) WITH PROPOFOL N/A 02/06/2015   Procedure: ESOPHAGOGASTRODUODENOSCOPY (EGD) WITH PROPOFOL;  Surgeon: Midge Minium, MD;  Location: [facility] ENDOSCOPY;  Service: Endoscopy;  Laterality: N/A;   ESOPHAGOGASTRODUODENOSCOPY (EGD) WITH PROPOFOL N/A 01/27/2017   Procedure: ESOPHAGOGASTRODUODENOSCOPY (EGD) WITH PROPOFOL;  Surgeon: Midge Minium, MD;  Location: [facility] ENDOSCOPY;  Service: Endoscopy;  Laterality: N/A;   ESOPHAGOGASTRODUODENOSCOPY (EGD) WITH PROPOFOL N/A 09/01/2019   Procedure: ESOPHAGOGASTRODUODENOSCOPY (EGD) WITH PROPOFOL;  Surgeon: Toney Reil, MD;  Location: [facility] ENDOSCOPY;  Service: Gastroenterology;  Laterality: N/A;   FEMORAL ARTERY STENT  10/2010   right sided (Dr. Excell Seltzer)   KNEE ARTHROSCOPY WITH MEDIAL MENISECTOMY Right 08/20/2017   Procedure: KNEE ARTHROSCOPY WITH MEDIAL  AND LATERAL MENISECTOMY;  Surgeon: Kennedy Bucker, MD;  Location: [facility] ORS;  Service: Orthopedics;  Laterality: Right;   POSTERIOR CERVICAL LAMINECTOMY N/A 03/08/2018   Procedure: POSTERIOR CERVICAL LAMINECTOMY-C7;  Surgeon: Lucy Chris, MD;  Location: [facility] ORS;  Service: Neurosurgery;  Laterality: N/A;   TONSILLECTOMY     TOTAL KNEE ARTHROPLASTY Right 04/14/2019   Procedure: RIGHT TOTAL KNEE ARTHROPLASTY;  Surgeon: Kennedy Bucker, MD;  Location: [facility] ORS;  Service: Orthopedics;  Laterality: Right;   TUBAL LIGATION      Current Outpatient Medications:    acetaminophen (TYLENOL 8 HOUR) 650 MG CR tablet, Take 2 tablets (1,300 mg total) by mouth 2 (two) times daily as needed for pain., Disp: 60 tablet, Rfl: 1   albuterol (VENTOLIN HFA) 108 (90 Base) MCG/ACT inhaler, Inhale 2 puffs into the lungs every 6 (six) hours as needed for wheezing or shortness of breath.,  Disp: 1 g, Rfl: 12   aspirin EC 81 MG tablet, Take 1 tablet (81 mg total) by mouth daily., Disp: 90 tablet, Rfl: 3   Azelastine HCl 0.15 % SOLN, Place 2 sprays into both nostrils daily as needed (allergies)., Disp: 30 mL, Rfl: 11   benzonatate (TESSALON) 200 MG capsule, Take 1 capsule (200 mg total) by mouth 3 (three) times daily as needed for cough., Disp: 30 capsule, Rfl: 0   dapagliflozin propanediol (FARXIGA) 10 MG TABS tablet, Take 1 tablet (10 mg total) by mouth daily before breakfast., Disp: 100 tablet, Rfl: 3   dicyclomine (BENTYL) 10 MG capsule, Take 1 capsule (10 mg total) by mouth 3 (three) times daily before meals. As needed for abdominal pain, Disp: 120 capsule, Rfl: 11   EPINEPHrine 0.3 mg/0.3 mL IJ SOAJ injection, Inject one pen into the thigh as needed for anaphylactic reaction. Then CALL 911. Use second pen if needed thereafter, Disp: 2 each, Rfl: 5   esomeprazole (NEXIUM) 40 MG capsule, Take 1 capsule (40 mg total) by mouth daily., Disp: 90 capsule, Rfl: 3   Evolocumab (REPATHA SURECLICK) 140 MG/ML SOAJ, INJECT 1 DOSE SUBCUTANEOUSLY EVERY 14 DAYS, Disp: 2 mL, Rfl: 4   ezetimibe (ZETIA) 10 MG tablet, Take 1 tablet (10 mg total) by mouth daily., Disp: 90 tablet, Rfl: 3   fluticasone (FLONASE) 50 MCG/ACT nasal spray, Place  2 sprays into both nostrils daily. Prn nasal congestion, Disp: 16 g, Rfl: 11   Fluticasone-Umeclidin-Vilant (TRELEGY ELLIPTA) 100-62.5-25 MCG/ACT AEPB, Inhale 1 puff into the lungs daily. Rinse mouth, Disp: 1 each, Rfl: 11   furosemide (LASIX) 20 MG tablet, TAKE 1 TABLET BY MOUTH ONCE DAILY AND 1 TABLET AT LUNCH THREE TIMES A WEEK, Disp: 136 tablet, Rfl: 1   GEMTESA 75 MG TABS, Take 1 tablet by mouth daily., Disp: , Rfl:    hydrocortisone cream 1 %, Apply topically 2 (two) times daily., Disp: 30 g, Rfl: 0   hydrOXYzine (ATARAX) 25 MG tablet, Take 1 tablet (25 mg total) by mouth daily as needed for anxiety., Disp: 60 tablet, Rfl: 1   ipratropium (ATROVENT) 0.06 %  nasal spray, Place 2 sprays into both nostrils 4 (four) times daily., Disp: 15 mL, Rfl: 12   ipratropium-albuterol (DUONEB) 0.5-2.5 (3) MG/3ML SOLN, USE 1 VIAL IN NEBULIZER TWICE DAILY AS NEEDED FOR WHEEZING OR SHORTNESS OF BREATH, Disp: 360 mL, Rfl: 3   isosorbide mononitrate (IMDUR) 30 MG 24 hr tablet, Take 1 tablet (30 mg total) by mouth 2 (two) times daily., Disp: 180 tablet, Rfl: 3   levocetirizine (XYZAL) 5 MG tablet, Take 1 tablet (5 mg total) by mouth at bedtime as needed for allergies., Disp: 90 tablet, Rfl: 3   linaclotide (LINZESS) 145 MCG CAPS capsule, TAKE 1 CAPSULE BY MOUTH BEFORE BREAKFAST, Disp: 90 capsule, Rfl: 3   metoprolol succinate (TOPROL-XL) 50 MG 24 hr tablet, Take with or immediately following a meal., Disp: 90 tablet, Rfl: 3   montelukast (SINGULAIR) 10 MG tablet, Take 1 tablet (10 mg total) by mouth at bedtime., Disp: 90 tablet, Rfl: 3   nitroGLYCERIN (NITROSTAT) 0.4 MG SL tablet, DISSOLVE ONE TABLET UNDER THE TONGUE EVERY 5 MINUTES AS NEEDED FOR CHEST PAIN.  DO NOT EXCEED A TOTAL OF 3 DOSES IN 15 MINUTES, Disp: 25 tablet, Rfl: 3   olopatadine (PATANOL) 0.1 % ophthalmic solution, INSTILL 1 DROP INTO EACH EYE TWICE DAILY, Disp: 10 mL, Rfl: 1   OneTouch Delica Lancets 33G MISC, USE TO CHECK GLUCOSE IN THE MORNING AND AT BEDTIME, Disp: 200 each, Rfl: 0   ONETOUCH VERIO test strip, USE 1 STRIP TO CHECK GLUCOSE TWICE DAILY ONCE  IN  THE  MORNING  AND  ONCE  AT  BEDTIME, Disp: 200 each, Rfl: 0   PARoxetine (PAXIL) 20 MG tablet, Take 1 tablet (20 mg total) by mouth daily., Disp: 90 tablet, Rfl: 3   potassium chloride (KLOR-CON M) 10 MEQ tablet, Take 10 mEq by mouth daily., Disp: , Rfl:    pregabalin (LYRICA) 75 MG capsule, Take 1 capsule by mouth twice daily, Disp: 60 capsule, Rfl: 0   rosuvastatin (CRESTOR) 20 MG tablet, Take 1 tablet (20 mg total) by mouth at bedtime., Disp: 90 tablet, Rfl: 0   sodium chloride (OCEAN) 0.65 % SOLN nasal spray, Place 2 sprays into both nostrils  daily as needed for congestion., Disp: 30 mL, Rfl: 11   Spacer/Aero-Holding Chambers (AEROCHAMBER MV) inhaler, Use as instructed, Disp: 1 each, Rfl: 0   tiZANidine (ZANAFLEX) 4 MG tablet, TAKE 1 TABLET BY MOUTH EVERY 6 HOURS AS NEEDED FOR MUSCLE SPASM, Disp: 30 tablet, Rfl: 0   XARELTO 2.5 MG TABS tablet, Take 1 tablet by mouth twice daily, Disp: 180 tablet, Rfl: 0    Family History  Problem Relation Age of Onset   Other Mother    Breast cancer Mother 73  breast cancer, late 70's   Cancer Mother        breast   Hypertension Father    Heart failure Father    Diabetes Father    Colon cancer Father 75   Glaucoma Father    Cancer Father        colorectal    Heart disease Father    Other Father        glaucoma   Brain cancer Sister    Arthritis Sister    Diabetes Sister    Hypertension Sister    Kidney disease Sister    Diabetes Brother    Hypertension Brother    Acute myelogenous leukemia Grandson        06/2019   Coronary artery disease Neg Hx    Stroke Neg Hx      Social History   Tobacco Use   Smoking status: Former    Current packs/day: 0.00    Average packs/day: 2.0 packs/day for 38.0 years (76.0 ttl pk-yrs)    Types: Cigarettes    Start date: 08/12/1972    Quit date: 08/12/2010    Years since quitting: 12.8   Smokeless tobacco: Never  Vaping Use   Vaping status: Never Used  Substance Use Topics   Alcohol use: No   Drug use: No    Types: Cocaine    Comment: Remote Hx (crack cocaine and marijuana).none since 28yrs plus    Allergies as of 06/02/2023 - Review Complete 06/02/2023  Allergen Reaction Noted   Citrullus vulgaris Other (See Comments) 04/13/2017   Latex Rash 08/23/2010   Shellfish allergy Anaphylaxis 01/23/2023   Sulfa antibiotics Anaphylaxis 08/23/2010   Sulfonamide derivatives Anaphylaxis 08/23/2010   Misc. sulfonamide containing compounds  01/23/2023   Penicillins Itching 08/26/2021   Soy allergy  03/10/2019   Tomato Itching 09/11/2020    Other Swelling 01/20/2012    Review of Systems:    All systems reviewed and negative except where noted in HPI.   Physical Exam:  BP 124/78 (BP Location: Left Arm, Patient Position: Sitting, Cuff Size: Normal)   Pulse 80   Temp (!) 97.4 F (36.3 C) (Oral)   Ht 5\' 5"  (1.651 m)   Wt 204 lb 4 oz (92.6 kg)   BMI 33.99 kg/m  No LMP recorded. Patient has had an ablation.  General:   Alert,  Well-developed, well-nourished, pleasant and cooperative in mild distress due to pain Head:  Normocephalic and atraumatic. Eyes:  Sclera clear, no icterus.   Conjunctiva pink. Ears:  Normal auditory acuity. Nose:  No deformity, discharge, or lesions. Mouth:  No deformity or lesions,oropharynx pink & moist. Neck:  Supple; no masses or thyromegaly. Lungs:  Respirations even and unlabored.  Clear throughout to auscultation.   No wheezes, crackles, or rhonchi. No acute distress. Heart:  Regular rate and rhythm; no murmurs, clicks, rubs, or gallops. Abdomen:  Normal bowel sounds. Soft, nontender and moderately distended, tympanic to percussion without masses, mild epigastric tenderness, no hepatosplenomegaly or hernias noted.  no rebound tenderness.   Rectal: Not performed Msk:  Symmetrical without gross deformities. Good, equal movement & strength bilaterally. Pulses:  Normal pulses noted. Extremities:  No clubbing or edema.  No cyanosis. Neurologic:  Alert and oriented x3;  grossly normal neurologically. Skin:  Intact without significant lesions or rashes. No jaundice. Psych:  Alert and cooperative. Normal mood and affect.  Imaging Studies: Reviewed  Assessment and Plan:   Javonna Balli is a 62 y.o. female with metabolic syndrome, chronic constipation,  history of gastric ulcers, H. pylori negative, confirmed healing of gastric ulcers is seen in consultation for chronic constipation and dyspepsia  Dyspepsia, sensation of food stuck in lower chest which eventually passes, associated with some  regurgitation, history of small hiatal hernia Likely secondary to presence of hiatal hernia EGD did not reveal peptic stricture in 08/2019 Normal gastric emptying study in 07/2022 CT abdomen and pelvis was unremarkable in 06/2022 Continue Nexium 40 mg 1-2 times daily before meals Advised patient regarding small frequent meals Defer EGD at this time unless her symptoms worsen.  Advised patient to call our office if her symptoms are worsening   Chronic constipation  History of sigmoid diverticulosis Reiterated on importance of high-fiber diet, adequate intake of water and fiber supplements as needed Continue Linzess 145 MCG daily Patient does not consume any carbonated beverages or red meat or white bread  Tubular adenomas of colon Surveillance colonoscopy in 06/2025  Follow-up as needed   Arlyss Repress, MD

## 2023-06-08 DIAGNOSIS — Z23 Encounter for immunization: Secondary | ICD-10-CM | POA: Diagnosis not present

## 2023-06-12 ENCOUNTER — Encounter: Payer: Self-pay | Admitting: Primary Care

## 2023-06-12 ENCOUNTER — Ambulatory Visit (INDEPENDENT_AMBULATORY_CARE_PROVIDER_SITE_OTHER): Payer: Medicare HMO | Admitting: Primary Care

## 2023-06-12 ENCOUNTER — Other Ambulatory Visit: Payer: Self-pay | Admitting: Family Medicine

## 2023-06-12 VITALS — BP 110/62 | HR 60 | Temp 98.2°F | Ht 65.0 in | Wt 201.0 lb

## 2023-06-12 DIAGNOSIS — R0981 Nasal congestion: Secondary | ICD-10-CM

## 2023-06-12 DIAGNOSIS — J4550 Severe persistent asthma, uncomplicated: Secondary | ICD-10-CM | POA: Diagnosis not present

## 2023-06-12 DIAGNOSIS — G4733 Obstructive sleep apnea (adult) (pediatric): Secondary | ICD-10-CM

## 2023-06-12 DIAGNOSIS — E1169 Type 2 diabetes mellitus with other specified complication: Secondary | ICD-10-CM

## 2023-06-12 LAB — CBC WITH DIFFERENTIAL/PLATELET
Basophils Absolute: 0.1 10*3/uL (ref 0.0–0.1)
Basophils Relative: 0.9 % (ref 0.0–3.0)
Eosinophils Absolute: 0.2 10*3/uL (ref 0.0–0.7)
Eosinophils Relative: 3.6 % (ref 0.0–5.0)
HCT: 44.9 % (ref 36.0–46.0)
Hemoglobin: 14.8 g/dL (ref 12.0–15.0)
Lymphocytes Relative: 39.6 % (ref 12.0–46.0)
Lymphs Abs: 2.5 10*3/uL (ref 0.7–4.0)
MCHC: 33 g/dL (ref 30.0–36.0)
MCV: 97.2 fL (ref 78.0–100.0)
Monocytes Absolute: 0.4 10*3/uL (ref 0.1–1.0)
Monocytes Relative: 6.7 % (ref 3.0–12.0)
Neutro Abs: 3.1 10*3/uL (ref 1.4–7.7)
Neutrophils Relative %: 49.2 % (ref 43.0–77.0)
Platelets: 173 10*3/uL (ref 150.0–400.0)
RBC: 4.62 Mil/uL (ref 3.87–5.11)
RDW: 14.2 % (ref 11.5–15.5)
WBC: 6.3 10*3/uL (ref 4.0–10.5)

## 2023-06-12 MED ORDER — BENZONATATE 200 MG PO CAPS: 200.0000 mg | ORAL_CAPSULE | Freq: Three times a day (TID) | ORAL | 0 refills | Status: DC | PRN

## 2023-06-12 NOTE — Progress Notes (Unsigned)
@Patient  ID: [name], female    DOB: 24-Oct-1960, 63 y.o.   MRN: [id number]  Chief Complaint  Patient presents with   Follow-up    Had to stop cpap due to headaches. SOB with exertion and non prod cough.     Referring provider: Dana Allan, MD  HPI: 62 year old female, warmer smoker quit in 2012.  Past medical history significant for hypertension, heart failure, asthma/COPD, OSA on CPAP, allergic rhinitis, eczema, fatty liver, GERD, hyperlipidemia, type 2 diabetes, Vit D deficiency.    Previous [facility] pulmonary encounter:  04/23/2023 Patient contacted today for overdue follow-up for severe persistent asthma.  During her last office visit with Dr. Craige Cotta in August 2023 she was started on Xolair.  Patient received EpiPen however did not receive training and subsequently was never started on biologic.  She tells me her asthma symptoms have been poorly controlled over the last year.  She is compliant with Trelegy.  Uses albuterol 2-3 times a week.  She has been unable to use CPAP due to nasal congestion.  Taking Xyzal all and Flonase daily.  She needs a refill of ocean nasal spray.  Severe persistent asthma: - Poorly controled / allergic phenotype  - Continue Trelegy one puff daily and prn albuterol  - Sending in prednisone taper for acute exacerbation, refill tesslon perles 200mg  TID prn cough and RX for aerochamber to use with HFA - CBC/IgE 2 weeks after completing prednisone - FU in 4-6 weeks in person visit to discuss starting biologics   Allergic rhinitis/acute sinusitis:  -No evidence of bacterial infection  -Continue Xyzal, singular and Flonase -Resume Ocean saline nasal spray twice daily  OSA: - Advised patient resume CPAP use once acute sinusitis symptoms have resolved - DME [employer], order placed to renew CPAP supplies     06/12/2023- Interim hx  Patient presents today for overdue OSA follow-up. HST 04/25/21 showed mild sleep apena, AHI 13.1/hour with SpO2 low  85%  Discussed the use of AI scribe software for clinical note transcription with the patient, who gave verbal consent to proceed.  History of Present Illness   The patient, with a history of severe persistent asthma and allergic rhinitis, presents for a regular follow-up. She reports experiencing occasional chest tightness and labored breathing, necessitating the use of a rescue inhaler alongside her regular Trelegy regimen. She also mentions a recent loss of her inhaler chamber. The patient's allergic rhinitis symptoms appear to be flaring, causing discomfort around the nasal area.  In addition to her respiratory issues, the patient has been struggling with her CPAP machine for sleep apnea. She reports experiencing severe headaches upon waking, which led her to discontinue its use. The patient also mentions that her breathing ceases during sleep when not using the CPAP machine, causing her to sleep in a semi-upright position.  The patient has been prescribed Singulair, Flonase, and Xyzal for allergic rhinitis, and Tessalon Perles for cough, which she finds helpful. She expresses a need for a refill of Tessalon Perles. The patient was previously considered for Xolair treatment for her severe persistent asthma, but this fell through and was never initiated. She expresses a willingness to reapply for Xolair, hoping it might help control her asthma symptoms and decrease the need for prednisone, which adversely affects her blood sugar levels.        Allergies  Allergen Reactions   Citrullus Vulgaris Other (See Comments)    Throat itchy   Latex Rash        Shellfish  Allergy Anaphylaxis    Hard shellfish/swelling in throat   Sulfa Antibiotics Anaphylaxis    Swelling in throat.   Sulfonamide Derivatives Anaphylaxis    Swelling in throat.   Misc. Sulfonamide Containing Compounds    Penicillins Itching   Soy Allergy     Diarrhea/upset stomach   Tomato Itching    Throat itchy - also Raw  carrots   Other Swelling    Immunization History  Administered Date(s) Administered   Influenza Split 05/22/2015, 04/21/2016   Influenza,inj,Quad PF,6+ Mos 04/05/2018, 03/29/2019, 04/19/2020, 04/19/2021   Influenza-Unspecified 05/20/2017, 05/27/2022   PFIZER(Purple Top)SARS-COV-2 Vaccination 01/03/2020, 01/24/2020   PNEUMOCOCCAL CONJUGATE-20 06/24/2022   RSV,unspecified 05/27/2022   Tdap 05/05/2019    Past Medical History:  Diagnosis Date   Abnormal feces    Abnormality of both breasts on screening mammogram 05/14/2020   Acute esophagogastric ulcer    Allergy    Anemia    Annual physical exam 01/14/2021   Anxiety and depression 04/05/2018   Arthritis    Asthma    Atypical chest pain 01/20/2014   Back ache 06/19/2015   Bacterial vaginitis    Benign breast cyst in female, right 02/22/2020   Benign neoplasm of ascending colon    Bilateral hip pain 04/19/2020   Blood in stool    Brachial neuritis or radiculitis NOS    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 being followed by neurosurgery/eye doctor and pcp. monitoring size.causes sinus problems   Breast mass, right 01/14/2021   Bronchitis    Bunion of right foot 08/26/2018   Cardiac arrhythmia    CCF (congestive cardiac failure) (HCC) 06/19/2015   Cervical neck pain with evidence of disc disease    C5/6 disease, MRI done late 2012 - no records available   Chronic combined systolic and diastolic CHF (congestive heart failure) (HCC)    a. 08/2010 Echo: mildly reduced EF 40-45%, mild diffuse hypokinesis; b. 06/2016 Echo: EF 50%, no rwma, mild to mod TR; c. 03/2017 Echo: EF 40-45%, Gr1 DD (prior echo reviewed and EF felt to be lower than reported).   Chronic neck pain 12/18/2017   Chronic pain    Chronic pain of inguinal region 07/24/2021   CLAUDICATION 08/23/2010   Qualifier: Diagnosis of   By: Katrinka Blazing RN, Megan       Cocaine abuse, in remission (HCC)    clean x 24 years   Colon polyp 06/19/2015   2010    Colon polyps    COPD (chronic obstructive pulmonary disease) (HCC)    a. 12/2018 PFT: No obvious obst/restrictive dzs.   COPD exacerbation (HCC) 03/12/2022   Coronary artery disease    a. PCI of LCX 2003; b. PCI of the LAD 2012 with a (2.5 x 8 mm BMS);  c.s/p CABG 4/12: L-LAD, VG-Dx, VG-OM, VG-RCA (Dr. Donata Clay);  d. 01/2012 MV: inf infarct, attenuation, no ischemia; e. 02/2017 MV: signifi attenuation artifact. Fixed basal antlat/inflat scar vs artifact. Reversible apical lat and mid antlat defect - ? atten vs ischemia. F/u echo w/o wma->Med rx.   Coronary artery disease involving native coronary artery of native heart without angina pectoris 08/23/2010   Depression    Depression    Diabetes mellitus without complication [facility])    Fall 05/14/2018   Family history of colon cancer    Family history of colon cancer 06/19/2015   Gastritis without bleeding    Generalized headaches    frequent   GERD (gastroesophageal reflux disease)  Headache    Heart murmur    Hematochezia    Hematochezia 01/11/2015   Hepatitis    history of hepatitis b   History of cervical cancer    s/p cryotherapy   History of cervical cancer    s/p cryotherapy   History of colonic polyps 01/11/2015   History of drug abuse (HCC)    cocaine, marijuana, clean since 1989   History of drug abuse (HCC)    cocaine, marijuana, clean since 1989   History of hepatitis B    from eating undercooked liver   History of MI (myocardial infarction)    HTN (hypertension) 12/20/2011   Hyperlipidemia    Hypertension    Insomnia 04/19/2020   Intractable vomiting with nausea    Intractable vomiting with nausea    Ischemic cardiomyopathy    a. 03/2017 Echo: EF 40-45%, Gr1 DD.   Lipoma of right lower extremity 08/30/2020   Mild intermittent asthma 10/13/2019   Myocardial infarction [facility]) 2003, 2012   Need for pneumococcal 20-valent conjugate vaccination 07/06/2022   Numbness and tingling in right hand 02/24/2018   Orthostatic  hypotension 04/30/2015   PAD (peripheral artery disease) (HCC)    a. s/p Right SFA atherectomy and PTA 01/15/11; b. 07/2018 ABI: R 1.02, L 1.09.   Pituitary mass (HCC)    a. 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 Mass abuts R cavernous sinus w/o definite invasion.   PND (post-nasal drip) 07/24/2021   Polyp of colon    Pruritus of both eyes 09/21/2020   Right knee pain 12/20/2011   Abnormal MRI right knee 08/29/2016 f/u [facility]      FINDINGS: Medial compartment: [There is abnormal linear signal within the medial meniscus near junction of posterior horn and mid body which abuts the tibial undersurface consistent with nondisplaced tear (5:8-10). The articular cartilage, and subchondral bone are normal. 3 mm bone island (7:14).  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   Seasonal allergies    Shortness of breath 08/23/2010   Qualifier: Diagnosis of   By: Katrinka Blazing RN, Megan       Skin lesion 04/19/2021   Sleep apnea    uses cpap   Smoking history    quit 07/2010   Smoking history 12/16/2010   Quit 07/2010   Status post total knee replacement using cement, right 04/14/2019   Thrush 04/19/2021   Thyroid disease    Urinary incontinence    Urinary incontinence 12/18/2017   Vaginal itching 04/19/2021   Vasomotor flushing 11/24/2016    Tobacco History: Social History   Tobacco Use  Smoking Status Former   Current packs/day: 0.00   Average packs/day: 2.0 packs/day for 38.0 years (76.0 ttl pk-yrs)   Types: Cigarettes   Start date: 08/12/1972   Quit date: 08/12/2010   Years since quitting: 12.8  Smokeless Tobacco Never   Counseling given: Not Answered   Outpatient Medications Prior to Visit  Medication Sig Dispense Refill   acetaminophen (TYLENOL 8 HOUR) 650 MG CR tablet Take 2 tablets (1,300 mg total) by mouth 2 (two) times daily as needed for pain. 60 tablet 1   albuterol (VENTOLIN HFA) 108 (90 Base) MCG/ACT inhaler Inhale 2  puffs into the lungs every 6 (six) hours as needed for wheezing or shortness of breath. 1 g 12   aspirin EC 81 MG tablet Take 1 tablet (81 mg total) by mouth daily. 90 tablet 3   Azelastine  HCl 0.15 % SOLN Place 2 sprays into both nostrils daily as needed (allergies). 30 mL 11   benzonatate (TESSALON) 200 MG capsule Take 1 capsule (200 mg total) by mouth 3 (three) times daily as needed for cough. 30 capsule 0   dapagliflozin propanediol (FARXIGA) 10 MG TABS tablet Take 1 tablet (10 mg total) by mouth daily before breakfast. 100 tablet 3   dicyclomine (BENTYL) 10 MG capsule Take 1 capsule (10 mg total) by mouth 3 (three) times daily before meals. As needed for abdominal pain 120 capsule 11   EPINEPHrine 0.3 mg/0.3 mL IJ SOAJ injection Inject one pen into the thigh as needed for anaphylactic reaction. Then CALL 911. Use second pen if needed thereafter 2 each 5   esomeprazole (NEXIUM) 40 MG capsule Take 1 capsule (40 mg total) by mouth daily. 90 capsule 3   Evolocumab (REPATHA SURECLICK) 140 MG/ML SOAJ INJECT 1 DOSE SUBCUTANEOUSLY EVERY 14 DAYS 2 mL 4   ezetimibe (ZETIA) 10 MG tablet Take 1 tablet (10 mg total) by mouth daily. 90 tablet 3   fluticasone (FLONASE) 50 MCG/ACT nasal spray Place 2 sprays into both nostrils daily. Prn nasal congestion 16 g 11   Fluticasone-Umeclidin-Vilant (TRELEGY ELLIPTA) 100-62.5-25 MCG/ACT AEPB Inhale 1 puff into the lungs daily. Rinse mouth 1 each 11   furosemide (LASIX) 20 MG tablet TAKE 1 TABLET BY MOUTH ONCE DAILY AND 1 TABLET AT LUNCH THREE TIMES A WEEK 136 tablet 1   GEMTESA 75 MG TABS Take 1 tablet by mouth daily.     hydrocortisone cream 1 % Apply topically 2 (two) times daily. 30 g 0   hydrOXYzine (ATARAX) 25 MG tablet Take 1 tablet (25 mg total) by mouth daily as needed for anxiety. 60 tablet 1   ipratropium (ATROVENT) 0.06 % nasal spray Place 2 sprays into both nostrils 4 (four) times daily. 15 mL 12   ipratropium-albuterol (DUONEB) 0.5-2.5 (3) MG/3ML SOLN USE  1 VIAL IN NEBULIZER TWICE DAILY AS NEEDED FOR WHEEZING OR SHORTNESS OF BREATH 360 mL 3   isosorbide mononitrate (IMDUR) 30 MG 24 hr tablet Take 1 tablet (30 mg total) by mouth 2 (two) times daily. 180 tablet 3   levocetirizine (XYZAL) 5 MG tablet Take 1 tablet (5 mg total) by mouth at bedtime as needed for allergies. 90 tablet 3   linaclotide (LINZESS) 145 MCG CAPS capsule TAKE 1 CAPSULE BY MOUTH BEFORE BREAKFAST 90 capsule 3   metoprolol succinate (TOPROL-XL) 50 MG 24 hr tablet Take with or immediately following a meal. 90 tablet 3   montelukast (SINGULAIR) 10 MG tablet Take 1 tablet (10 mg total) by mouth at bedtime. 90 tablet 3   nitroGLYCERIN (NITROSTAT) 0.4 MG SL tablet DISSOLVE ONE TABLET UNDER THE TONGUE EVERY 5 MINUTES AS NEEDED FOR CHEST PAIN.  DO NOT EXCEED A TOTAL OF 3 DOSES IN 15 MINUTES 25 tablet 3   olopatadine (PATANOL) 0.1 % ophthalmic solution INSTILL 1 DROP INTO EACH EYE TWICE DAILY 10 mL 1   OneTouch Delica Lancets 33G MISC USE TO CHECK GLUCOSE IN THE MORNING AND AT BEDTIME 200 each 0   ONETOUCH VERIO test strip USE 1 STRIP TO CHECK GLUCOSE TWICE DAILY ONCE  IN  THE  MORNING  AND  ONCE  AT  BEDTIME 200 each 0   PARoxetine (PAXIL) 20 MG tablet Take 1 tablet (20 mg total) by mouth daily. 90 tablet 3   potassium chloride (KLOR-CON M) 10 MEQ tablet Take 10 mEq by mouth daily.  pregabalin (LYRICA) 75 MG capsule Take 1 capsule by mouth twice daily 60 capsule 0   rosuvastatin (CRESTOR) 20 MG tablet Take 1 tablet (20 mg total) by mouth at bedtime. 90 tablet 0   sodium chloride (OCEAN) 0.65 % SOLN nasal spray Place 2 sprays into both nostrils daily as needed for congestion. 30 mL 11   Spacer/Aero-Holding Chambers (AEROCHAMBER MV) inhaler Use as instructed 1 each 0   tiZANidine (ZANAFLEX) 4 MG tablet TAKE 1 TABLET BY MOUTH EVERY 6 HOURS AS NEEDED FOR MUSCLE SPASM 30 tablet 0   XARELTO 2.5 MG TABS tablet Take 1 tablet by mouth twice daily 180 tablet 0   No facility-administered  medications prior to visit.    Review of Systems  Review of Systems  Constitutional: Negative.   HENT: Negative.    Respiratory:  Negative for cough, shortness of breath and wheezing.   Cardiovascular: Negative.      Physical Exam  There were no vitals taken for this visit. Physical Exam Constitutional:      Appearance: Normal appearance.  HENT:     Head: Normocephalic and atraumatic.  Cardiovascular:     Rate and Rhythm: Normal rate and regular rhythm.  Pulmonary:     Effort: Pulmonary effort is normal.     Breath sounds: Normal breath sounds. No wheezing, rhonchi or rales.  Neurological:     Mental Status: She is alert.      Lab Results:  CBC    Component Value Date/Time   WBC 6.1 11/21/2022 1012   RBC 4.56 11/21/2022 1012   HGB 14.6 11/21/2022 1012   HGB 12.8 02/13/2012 2319   HCT 44.5 11/21/2022 1012   HCT 38.3 02/13/2012 2319   PLT 161.0 11/21/2022 1012   PLT 144 (L) 02/13/2012 2319   MCV 97.7 11/21/2022 1012   MCV 94 02/13/2012 2319   MCH 30.6 03/11/2022 1333   MCHC 32.8 11/21/2022 1012   RDW 14.1 11/21/2022 1012   RDW 13.9 02/13/2012 2319   LYMPHSABS 2.4 11/21/2022 1012   MONOABS 0.4 11/21/2022 1012   EOSABS 0.2 11/21/2022 1012   BASOSABS 0.1 11/21/2022 1012    BMET    Component Value Date/Time   NA 143 11/21/2022 1012   NA 142 01/20/2014 1127   NA 141 02/13/2012 2319   K 3.5 11/21/2022 1012   K 3.6 02/13/2012 2319   CL 107 11/21/2022 1012   CL 106 02/13/2012 2319   CO2 25 11/21/2022 1012   CO2 27 02/13/2012 2319   GLUCOSE 106 (H) 11/21/2022 1012   GLUCOSE 92 02/13/2012 2319   BUN 14 11/21/2022 1012   BUN 16 01/20/2014 1127   BUN 16 02/13/2012 2319   CREATININE 1.11 11/21/2022 1012   CREATININE 1.01 (H) 08/21/2020 1014   CALCIUM 9.3 11/21/2022 1012   CALCIUM 8.7 02/13/2012 2319   GFRNONAA >60 05/19/2022 1202   GFRNONAA >60 02/13/2012 2319   GFRAA >60 08/25/2019 1018   GFRAA >60 02/13/2012 2319    BNP    Component Value  Date/Time   BNP 1,292 (H) 02/13/2012 2319    ProBNP No results found for: "PROBNP"  Imaging: No results found.   Assessment & Plan:    Asthma Reports occasional chest tightness and need for rescue inhaler. Lost spacer for inhaler. Currently on Trelegy and Singulair. -Refill Tessalon Perles for cough. -Order labs to assess eligibility for Xolair, a biologic to decrease frequency of asthma exacerbations and need for prednisone.  Allergic Rhinitis Currently on Singulair, Flonase,  Xyzal, and Ocean Saline nasal spray as needed. -Continue current regimen.  Obstructive Sleep Apnea Reports discomfort with current CPAP mask and pressure settings. Reports apnea episodes when not using CPAP. -Change CPAP pressure settings to a set pressure of 11. -Order fitting for a nasal cradle mask. -Advise to aim for CPAP use every night for 4-6 hours.  Follow-up Pending lab results, reapply for Xolair.         Glenford Bayley, NP 06/12/2023

## 2023-06-12 NOTE — Patient Instructions (Addendum)
Recommendations: Continue Trelegy one puff daily (rinse mouth after use) Continue Xyzal, singular and Flonase Refill tesslon perles   Orders: Labs today Change pressure 11cm h20  Mask fitting for nasal cradle mask   Follow-up: 2-3 months with Waynetta Sandy NP

## 2023-06-15 ENCOUNTER — Telehealth: Payer: Self-pay | Admitting: Primary Care

## 2023-06-15 LAB — IGE: IgE (Immunoglobulin E), Serum: 434 kU/L — ABNORMAL HIGH (ref <=114)

## 2023-06-15 NOTE — Telephone Encounter (Signed)
Based on updated IgE on 06/12/23 of 434 and body weight of 91.2kg, Xolair dose would be 375mg  SQ every 2 weeks  One 300mg  pen and one 75mg  pen injector

## 2023-06-15 NOTE — Telephone Encounter (Signed)
Can we re-apply for Xolair, started process with Dr. Craige Cotta but never received medication. She has an epipen. IGE >400  on 06/12/23

## 2023-06-15 NOTE — Progress Notes (Signed)
Please let patient know IgE (allergy marker) was still significantly elevated. We will reapply for Xolair injections. I have sent a message to pharmacy

## 2023-06-19 ENCOUNTER — Telehealth: Payer: Self-pay

## 2023-06-19 ENCOUNTER — Other Ambulatory Visit (HOSPITAL_COMMUNITY): Payer: Self-pay

## 2023-06-19 DIAGNOSIS — J4550 Severe persistent asthma, uncomplicated: Secondary | ICD-10-CM

## 2023-06-19 NOTE — Telephone Encounter (Signed)
Received notification from [facility] regarding a prior authorization for XOLAIR. Authorization has been APPROVED from 02/26/2023 to 07/28/2023. Approval letter sent to scan center.  Per test claim, copay for 28 days supply (5mL total) is $0.  Patient can fill through [facility]: [phone]   Authorization # [id number]

## 2023-06-19 NOTE — Telephone Encounter (Signed)
Submitted a Prior Authorization request to [facility] for XOLAIR via CoverMyMeds. Will update once we receive a response.  Key: [id number]

## 2023-06-23 DIAGNOSIS — N2581 Secondary hyperparathyroidism of renal origin: Secondary | ICD-10-CM | POA: Diagnosis not present

## 2023-06-23 DIAGNOSIS — E1122 Type 2 diabetes mellitus with diabetic chronic kidney disease: Secondary | ICD-10-CM | POA: Diagnosis not present

## 2023-06-23 DIAGNOSIS — R609 Edema, unspecified: Secondary | ICD-10-CM | POA: Diagnosis not present

## 2023-06-23 DIAGNOSIS — D649 Anemia, unspecified: Secondary | ICD-10-CM | POA: Diagnosis not present

## 2023-06-23 DIAGNOSIS — I509 Heart failure, unspecified: Secondary | ICD-10-CM | POA: Diagnosis not present

## 2023-06-23 DIAGNOSIS — I10 Essential (primary) hypertension: Secondary | ICD-10-CM | POA: Diagnosis not present

## 2023-06-23 DIAGNOSIS — K219 Gastro-esophageal reflux disease without esophagitis: Secondary | ICD-10-CM | POA: Diagnosis not present

## 2023-06-23 DIAGNOSIS — I2510 Atherosclerotic heart disease of native coronary artery without angina pectoris: Secondary | ICD-10-CM | POA: Diagnosis not present

## 2023-06-23 DIAGNOSIS — N1831 Chronic kidney disease, stage 3a: Secondary | ICD-10-CM | POA: Diagnosis not present

## 2023-06-23 DIAGNOSIS — E785 Hyperlipidemia, unspecified: Secondary | ICD-10-CM | POA: Diagnosis not present

## 2023-06-23 DIAGNOSIS — J449 Chronic obstructive pulmonary disease, unspecified: Secondary | ICD-10-CM | POA: Diagnosis not present

## 2023-06-23 DIAGNOSIS — I779 Disorder of arteries and arterioles, unspecified: Secondary | ICD-10-CM | POA: Diagnosis not present

## 2023-06-23 LAB — HEMOGLOBIN A1C: Hemoglobin A1C: 6.3

## 2023-06-23 MED ORDER — XOLAIR 75 MG/0.5ML ~~LOC~~ SOAJ
SUBCUTANEOUS | 0 refills | Status: DC
  Filled 2023-07-08: qty 1, 28d supply, fill #0

## 2023-06-23 MED ORDER — XOLAIR 300 MG/2ML ~~LOC~~ SOAJ
SUBCUTANEOUS | 0 refills | Status: DC
  Filled 2023-07-08: qty 4, 28d supply, fill #0

## 2023-06-23 NOTE — Telephone Encounter (Signed)
Attempted to call patient to schedule XOLAIR new start appointment. Was unable to reach her. Left a voicemail with clinic pharmacist call back number [phone]).  Sofie Rower, PharmD [facility] PGY-1

## 2023-06-28 DIAGNOSIS — G4733 Obstructive sleep apnea (adult) (pediatric): Secondary | ICD-10-CM | POA: Diagnosis not present

## 2023-06-28 DIAGNOSIS — I259 Chronic ischemic heart disease, unspecified: Secondary | ICD-10-CM | POA: Diagnosis not present

## 2023-06-28 DIAGNOSIS — G4710 Hypersomnia, unspecified: Secondary | ICD-10-CM | POA: Diagnosis not present

## 2023-06-29 ENCOUNTER — Other Ambulatory Visit: Payer: Self-pay

## 2023-07-01 MED ORDER — EPINEPHRINE 0.3 MG/0.3ML IJ SOAJ: INTRAMUSCULAR | 5 refills | Status: DC

## 2023-07-01 NOTE — Telephone Encounter (Signed)
I spoke with patient regarding Xolair and requirement for Epipen. New rx sent to pharmacy today.  Reviewed 2 hour monitoring period with first injection, then 30 minutes for 2nd and  3rd. She states she'll need to speak with her sister about getting ride to [place] since she cannot drive with her vision. She'll call back if she needs to r/s but first Xolair injections scheduled for 07/14/23 for now (patient advised that she must have Epipen on hand)  Chesley Mires, PharmD, MPH, BCPS, CPP Clinical Pharmacist (Rheumatology and Pulmonology)

## 2023-07-03 ENCOUNTER — Ambulatory Visit: Payer: Medicare HMO | Admitting: Family Medicine

## 2023-07-03 ENCOUNTER — Encounter: Payer: Self-pay | Admitting: Family Medicine

## 2023-07-03 ENCOUNTER — Other Ambulatory Visit (HOSPITAL_COMMUNITY)
Admission: RE | Admit: 2023-07-03 | Discharge: 2023-07-03 | Disposition: A | Discharge status: Home / Self Care | Payer: Medicare HMO | Source: Ambulatory Visit | Attending: Family Medicine | Admitting: Family Medicine

## 2023-07-03 VITALS — BP 112/64 | HR 64 | Temp 98.2°F | Resp 18 | Ht 65.0 in | Wt 202.5 lb

## 2023-07-03 DIAGNOSIS — E118 Type 2 diabetes mellitus with unspecified complications: Secondary | ICD-10-CM

## 2023-07-03 DIAGNOSIS — N898 Other specified noninflammatory disorders of vagina: Secondary | ICD-10-CM | POA: Insufficient documentation

## 2023-07-03 DIAGNOSIS — Z7984 Long term (current) use of oral hypoglycemic drugs: Secondary | ICD-10-CM | POA: Diagnosis not present

## 2023-07-03 DIAGNOSIS — R350 Frequency of micturition: Secondary | ICD-10-CM

## 2023-07-03 DIAGNOSIS — N952 Postmenopausal atrophic vaginitis: Secondary | ICD-10-CM

## 2023-07-03 LAB — POCT URINALYSIS DIP (CLINITEK)
Bilirubin, UA: NEGATIVE
Blood, UA: NEGATIVE
Glucose, UA: 100 mg/dL — AB
Ketones, POC UA: NEGATIVE mg/dL
Leukocytes, UA: NEGATIVE
Nitrite, UA: NEGATIVE
POC PROTEIN,UA: NEGATIVE
Spec Grav, UA: 1.02 (ref 1.010–1.025)
Urobilinogen, UA: 0.2 U/dL
pH, UA: 6.5 (ref 5.0–8.0)

## 2023-07-03 NOTE — Patient Instructions (Addendum)
It was a pleasure meeting you today. Thank you for allowing me to take part in your health care.  Our goals for today as we discussed include:  Diabetes level is good Blood pressure is good  Urine Will notify you of swab results and if needing treatment    This is a list of the screening recommended for you and due dates:  Health Maintenance  Topic Date Due   Complete foot exam   Never done   Zoster (Shingles) Vaccine (1 of 2) Never done   COVID-19 Vaccine (3 - Pfizer risk series) 02/21/2020   Screening for Lung Cancer  03/12/2023   Medicare Annual Wellness Visit  08/16/2023   Flu Shot  10/26/2023*   Pap with HPV screening  07/02/2024*   Yearly kidney function blood test for diabetes  11/21/2023   Yearly kidney health urinalysis for diabetes  11/21/2023   Hemoglobin A1C  12/21/2023   Eye exam for diabetics  05/27/2024   Mammogram  10/30/2024   Colon Cancer Screening  07/15/2025   DTaP/Tdap/Td vaccine (2 - Td or Tdap) 05/04/2029   Hepatitis C Screening  Completed   HIV Screening  Completed   HPV Vaccine  Aged Out  *Topic was postponed. The date shown is not the original due date.      Follow up as needed  If you have any questions or concerns, please do not hesitate to call the office at [phone].  I look forward to our next visit and until then take care and stay safe.  Regards,   Dana Allan, MD   [facility]

## 2023-07-03 NOTE — Progress Notes (Unsigned)
SUBJECTIVE:   Chief Complaint  Patient presents with   Medical Management of Chronic Issues    Follow up    Vaginal Itching   HPI Patient presents to clinic for follow up chronic disease management  Discussed the use of AI scribe software for clinical note transcription with the patient, who gave verbal consent to proceed.  History of Present Illness The patient presents with a chief complaint of persistent vaginal itching and fatigue. The itching, described as severe and internal, began approximately two years ago and has progressively worsened. The patient reports that the itching is so intense that it often results in rawness and occasional bleeding. She denies any associated rash, discharge, or dysuria, but does note increased urinary frequency, which she attributes to her fluid pills. The patient has not been sexually active for several years.  In addition to the vaginal itching, the patient also reports chronic fatigue and stomach discomfort. She describes the stomach discomfort as a sensation of fullness or blockage, particularly after eating, which sometimes results in regurgitation unless she physically moves around. The patient has a known hernia which may be contributing to these symptoms.  The patient also mentions a history of allergies, for which she is currently taking medication. She has been offered allergy shots but has declined due to logistical and personal preference reasons. She has an EpiPen but would like instruction on its use.  The patient's other known medical conditions include diabetes, for which she has a recent HbA1c of 6.3, and hypertension, which appears to be well-controlled. She is also on fluid pills, the specifics of which are not mentioned in the conversation.    PERTINENT PMH / PSH: As above  OBJECTIVE:  BP 112/64   Pulse 64   Temp 98.2 F (36.8 C)   Resp 18   Ht 5\' 5"  (1.651 m)   Wt 202 lb 8 oz (91.9 kg)   SpO2 96%   BMI 33.70 kg/m     Physical Exam Vitals reviewed. Exam conducted with a chaperone present.  Constitutional:      General: She is not in acute distress.    Appearance: Normal appearance. She is obese. She is not ill-appearing, toxic-appearing or diaphoretic.  Eyes:     General:        Right eye: No discharge.        Left eye: No discharge.     Conjunctiva/sclera: Conjunctivae normal.  Cardiovascular:     Rate and Rhythm: Normal rate and regular rhythm.     Heart sounds: Normal heart sounds.  Pulmonary:     Effort: Pulmonary effort is normal.     Breath sounds: Normal breath sounds.  Abdominal:     General: Bowel sounds are normal.  Genitourinary:    Exam position: Lithotomy position.     Labia:        Right: No rash, tenderness or lesion.        Left: No rash, tenderness or lesion.      Comments: Absent cervix/uterus Atrophic vaginal wall Skin:    General: Skin is warm and dry.  Neurological:     Mental Status: She is alert and oriented to person, place, and time. Mental status is at baseline.  Psychiatric:        Mood and Affect: Mood normal.        Behavior: Behavior normal.        Thought Content: Thought content normal.        Judgment: Judgment normal.  07/03/2023    9:02 AM 02/23/2023   11:12 AM 10/16/2022    8:57 AM 08/15/2022    3:16 PM 06/24/2022   11:04 AM  Depression screen PHQ 2/9  Decreased Interest 1 0 1 0 2  Down, Depressed, Hopeless 0 0 0 0 3  PHQ - 2 Score 1 0 1 0 5  Altered sleeping 1 2 3  0 3  Tired, decreased energy 1 0 0 0 2  Change in appetite 1 1 0 0 2  Feeling bad or failure about yourself  0 0 0 0 0  Trouble concentrating 1 0 1 0 1  Moving slowly or fidgety/restless 0 0 1 0 0  Suicidal thoughts 0 0 0 0 0  PHQ-9 Score 5 3 6  0 13  Difficult doing work/chores Somewhat difficult Somewhat difficult Very difficult        07/03/2023    9:02 AM 06/24/2022   11:05 AM 02/21/2020    2:13 PM  GAD 7 : Generalized Anxiety Score  Nervous, Anxious, on Edge 0 2  3  Control/stop worrying 0 3 1  Worry too much - different things 1 3 2   Trouble relaxing 1 2 1   Restless 0 0 0  Easily annoyed or irritable 0 2 2  Afraid - awful might happen 0  0  Total GAD 7 Score 2  9  Anxiety Difficulty Somewhat difficult Very difficult Somewhat difficult    ASSESSMENT/PLAN:  Vaginal atrophy Assessment & Plan: Chronic, severe itching in the vaginal area, particularly in the folds and lips. No rash or discharge noted on examination. Likely due to post-menopausal atrophic vaginitis. -Obtained swabs to rule out infection. -Recommended use of lubrication such as KY Jelly for symptomatic relief.   Vaginal itching -     Cervicovaginal ancillary only  Frequent urination Assessment & Plan: Reports frequent urination, likely due to diuretic use. No other urinary symptoms reported. -POC urine negative -Continue current management.   Orders: -     POCT URINALYSIS DIP (CLINITEK)  Type 2 diabetes mellitus with complications (HCC) Assessment & Plan: Chronic.  Asymptomatic.  Compliant with current medication. Continue Farxiga 10 mg daily Continue diet and exercise Check A1c  On statin therapy Not currently on ACEi/ARB.  Soft BP and well controlled Eye exam due 08/24 Foot exam completed today    PDMP reviewed  Return if symptoms worsen or fail to improve, for PCP.  Dana Allan, MD

## 2023-07-05 ENCOUNTER — Encounter: Payer: Self-pay | Admitting: Family Medicine

## 2023-07-05 DIAGNOSIS — R350 Frequency of micturition: Secondary | ICD-10-CM | POA: Insufficient documentation

## 2023-07-05 DIAGNOSIS — N952 Postmenopausal atrophic vaginitis: Secondary | ICD-10-CM | POA: Insufficient documentation

## 2023-07-05 NOTE — Assessment & Plan Note (Signed)
Chronic, severe itching in the vaginal area, particularly in the folds and lips. No rash or discharge noted on examination. Likely due to post-menopausal atrophic vaginitis. -Obtained swabs to rule out infection. -Recommended use of lubrication such as KY Jelly for symptomatic relief.

## 2023-07-05 NOTE — Assessment & Plan Note (Signed)
Chronic.  Asymptomatic.  Compliant with current medication. Continue Farxiga 10 mg daily Continue diet and exercise Check A1c  On statin therapy Not currently on ACEi/ARB.  Soft BP and well controlled Eye exam due 08/24 Foot exam completed today

## 2023-07-05 NOTE — Assessment & Plan Note (Signed)
Reports frequent urination, likely due to diuretic use. No other urinary symptoms reported. -POC urine negative -Continue current management.

## 2023-07-06 LAB — CERVICOVAGINAL ANCILLARY ONLY
Bacterial Vaginitis (gardnerella): NEGATIVE
Candida Glabrata: NEGATIVE
Candida Vaginitis: NEGATIVE
Chlamydia: NEGATIVE
Comment: NEGATIVE
Comment: NEGATIVE
Comment: NEGATIVE
Comment: NEGATIVE
Comment: NEGATIVE
Comment: NORMAL
Neisseria Gonorrhea: NEGATIVE
Trichomonas: NEGATIVE

## 2023-07-08 ENCOUNTER — Other Ambulatory Visit: Payer: Self-pay

## 2023-07-08 NOTE — Progress Notes (Signed)
Specialty Pharmacy Initial Fill Coordination Note  Larissa Ollivierre is a 62 y.o. female contacted today regarding initial fill of specialty medication(s) Omalizumab   Patient requested Courier to Provider Office   Delivery date: 07/13/23   Verified address: [address] [place], [place] [address]   Medication will be filled on 07/10/2023.   Patient is aware of $0 copayment.

## 2023-07-10 NOTE — Progress Notes (Deleted)
HPI Patient presents today to [name] to see pharmacy team for Xolair new start.  Past medical history includes ***  Epipen on hand and in date: {yes/no:20286}  Number of hospitalizations in past year: Number of ***COPD/asthma exacerbations in past year:   Respiratory Medications Current regimen: *** Tried in past: *** Patient reports {Adherence challenges yes no:3044014::"adherence challenges","no known adherence challenges"}  OBJECTIVE Allergies  Allergen Reactions   Citrullus Vulgaris Other (See Comments)    Throat itchy   Latex Rash        Shellfish Allergy Anaphylaxis    Hard shellfish/swelling in throat   Sulfa Antibiotics Anaphylaxis    Swelling in throat.   Sulfonamide Derivatives Anaphylaxis    Swelling in throat.   Misc. Sulfonamide Containing Compounds    Penicillins Itching   Soy Allergy (Do Not Select)     Diarrhea/upset stomach   Tomato Itching    Throat itchy - also Raw carrots   Other Swelling    Outpatient Encounter Medications as of 07/14/2023  Medication Sig   acetaminophen (TYLENOL 8 HOUR) 650 MG CR tablet Take 2 tablets (1,300 mg total) by mouth 2 (two) times daily as needed for pain.   albuterol (VENTOLIN HFA) 108 (90 Base) MCG/ACT inhaler Inhale 2 puffs into the lungs every 6 (six) hours as needed for wheezing or shortness of breath.   aspirin EC 81 MG tablet Take 1 tablet (81 mg total) by mouth daily.   Azelastine HCl 0.15 % SOLN Place 2 sprays into both nostrils daily as needed (allergies).   benzonatate (TESSALON) 200 MG capsule Take 1 capsule (200 mg total) by mouth 3 (three) times daily as needed for cough.   dapagliflozin propanediol (FARXIGA) 10 MG TABS tablet Take 1 tablet (10 mg total) by mouth daily before breakfast.   dicyclomine (BENTYL) 10 MG capsule Take 1 capsule (10 mg total) by mouth 3 (three) times daily before meals. As needed for abdominal pain   EPINEPHrine 0.3 mg/0.3 mL IJ SOAJ injection Inject one pen into the  thigh as needed for anaphylactic reaction. Then CALL 911. Use second pen if needed thereafter   esomeprazole (NEXIUM) 40 MG capsule Take 1 capsule (40 mg total) by mouth daily.   Evolocumab (REPATHA SURECLICK) 140 MG/ML SOAJ INJECT 1 DOSE SUBCUTANEOUSLY EVERY 14 DAYS   ezetimibe (ZETIA) 10 MG tablet Take 1 tablet (10 mg total) by mouth daily.   fluticasone (FLONASE) 50 MCG/ACT nasal spray Place 2 sprays into both nostrils daily. Prn nasal congestion   Fluticasone-Umeclidin-Vilant (TRELEGY ELLIPTA) 100-62.5-25 MCG/ACT AEPB Inhale 1 puff into the lungs daily. Rinse mouth   furosemide (LASIX) 20 MG tablet TAKE 1 TABLET BY MOUTH ONCE DAILY AND 1 TABLET AT LUNCH THREE TIMES A WEEK   GEMTESA 75 MG TABS Take 1 tablet by mouth daily.   hydrocortisone cream 1 % Apply topically 2 (two) times daily.   hydrOXYzine (ATARAX) 25 MG tablet Take 1 tablet (25 mg total) by mouth daily as needed for anxiety.   ipratropium (ATROVENT) 0.06 % nasal spray Place 2 sprays into both nostrils 4 (four) times daily.   ipratropium-albuterol (DUONEB) 0.5-2.5 (3) MG/3ML SOLN USE 1 VIAL IN NEBULIZER TWICE DAILY AS NEEDED FOR WHEEZING OR SHORTNESS OF BREATH   isosorbide mononitrate (IMDUR) 30 MG 24 hr tablet Take 1 tablet (30 mg total) by mouth 2 (two) times daily.   levocetirizine (XYZAL) 5 MG tablet Take 1 tablet (5 mg total) by mouth at bedtime as needed for allergies.  linaclotide (LINZESS) 145 MCG CAPS capsule TAKE 1 CAPSULE BY MOUTH BEFORE BREAKFAST   metoprolol succinate (TOPROL-XL) 50 MG 24 hr tablet Take with or immediately following a meal.   montelukast (SINGULAIR) 10 MG tablet Take 1 tablet (10 mg total) by mouth at bedtime.   nitroGLYCERIN (NITROSTAT) 0.4 MG SL tablet DISSOLVE ONE TABLET UNDER THE TONGUE EVERY 5 MINUTES AS NEEDED FOR CHEST PAIN.  DO NOT EXCEED A TOTAL OF 3 DOSES IN 15 MINUTES   olopatadine (PATANOL) 0.1 % ophthalmic solution INSTILL 1 DROP INTO EACH EYE TWICE DAILY   omalizumab (XOLAIR) 300 MG/2ML  injection Inject 300mg  into the skin every 14 days (total dose 375mg  every 14 days). Courier to pulm: [address], [place] [address]. Appt on 07/01/23   omalizumab Geoffry Paradise) 75 MG/0.5ML pen Inject 75mg  into the skin every 14 days (total dose 375mg  every 14 days). Courier to pulm: [address], [address], [place] [address]. Appt on 07/01/23   OneTouch Delica Lancets 33G MISC USE TO CHECK GLUCOSE IN THE MORNING AND AT BEDTIME   ONETOUCH VERIO test strip USE 1 STRIP TO CHECK GLUCOSE TWICE DAILY ONCE  IN  THE  MORNING  AND  ONCE  AT  BEDTIME   PARoxetine (PAXIL) 20 MG tablet Take 1 tablet (20 mg total) by mouth daily.   potassium chloride (KLOR-CON M) 10 MEQ tablet Take 10 mEq by mouth daily.   pregabalin (LYRICA) 75 MG capsule Take 1 capsule by mouth twice daily   rosuvastatin (CRESTOR) 20 MG tablet TAKE 1 TABLET BY MOUTH AT BEDTIME   sodium chloride (OCEAN) 0.65 % SOLN nasal spray Place 2 sprays into both nostrils daily as needed for congestion.   Spacer/Aero-Holding Chambers (AEROCHAMBER MV) inhaler Use as instructed   tiZANidine (ZANAFLEX) 4 MG tablet TAKE 1 TABLET BY MOUTH EVERY 6 HOURS AS NEEDED FOR MUSCLE SPASM   XARELTO 2.5 MG TABS tablet Take 1 tablet by mouth twice daily   No facility-administered encounter medications on file as of 07/14/2023.     Immunization History  Administered Date(s) Administered   Influenza Split 05/22/2015, 04/21/2016   Influenza,inj,Quad PF,6+ Mos 04/05/2018, 03/29/2019, 04/19/2020, 04/19/2021   Influenza-Unspecified 05/20/2017, 05/27/2022   PFIZER(Purple Top)SARS-COV-2 Vaccination 01/03/2020, 01/24/2020   PNEUMOCOCCAL CONJUGATE-20 06/24/2022   RSV,unspecified 05/27/2022   Tdap 05/05/2019     PFTs    Latest Ref Rng & Units 05/16/2016    2:17 PM  PFT Results  FVC-Pre L 2.68  P  FVC-Predicted Pre % 94  P  FVC-Post L 2.49  P  FVC-Predicted Post % 87  P  Pre FEV1/FVC % % 79  P  Post FEV1/FCV % % 79  P  FEV1-Pre L 2.11  P   FEV1-Predicted Pre % 94  P  FEV1-Post L 1.98  P  DLCO uncorrected ml/min/mmHg 16.28  P  DLCO UNC% % 63  P  DLVA Predicted % 72  P  TLC L 5.10  P  TLC % Predicted % 98  P  RV % Predicted % 115  P    P Preliminary result     Eosinophils Most recent blood eosinophil count was *** cells/microL taken on ***.   IgE: *** on *** Pre-treatment weight:    Assessment   Biologics training for omalizumab (Xolair)  Goals of therapy: Mechanism: IgG monoclonal antibody (recombinant DNA derived) which inhibits IgE binding to the high-affinity IgE receptor on mast cells and basophils.  Reviewed that Xolair is add-on  medication and patient must continue maintenance inhaler regimen. Response to therapy: may take 3 to 6 months to determine efficacy. Discussed that patients generally feel improvement sooner than 3 months.  Side effects: anaphylaxis (0.1%) (black boxed warning), injection site reaction (45%), arthralgia (2.9% to 8%), headache (3% to 15%)  Dose: *** every *** based on pre-treatment IgE of *** and weight of ***kg. Patient advised that each dose will be *** purple and *** blue pre-filled syringes.  Administration/Storage:  Reviewed administration sites of thigh or abdomen (at least 2-3 inches away from abdomen). Reviewed the upper arm is only appropriate if caregiver is administering injection. Patient advised of injection site reaction management. Discussed that injections may take 5 to 10 seconds to administer (solution is slightly viscous). Do not inject into moles, scars, bruises, tender areas, or broken skin. Do not shake pre-filled syringe as this could lead to product foaming or precipitation. Do not use if solution is discolored or contains particulate matter or if window on prefilled pen is yellow (indicates pen has been used).  Reviewed storage of medication in refrigerator. Reviewed that Xolair can be stored at room temperature in unopened carton for up to 4 hours  only.  Access: Approval of Xolair through: {specialtycoverage:25706} Patient enrolled into copay card program to help with copay assistance.  Patient self-administered Xolair 150mg /mL x *** in {injsitedsg:28167} and Xolair 75mg /0.50mL x ***  in {injsitedsg:28167} using sample Xolair 150mg /mL pre-filled syringe x *** NDC: *** Lot: *** Expiration: ***  Xolair 75mg /0.79mL pre-filled syringe NDC: *** Lot: *** Expiration: ***  Patient monitored for 2 hours for adverse reaction (checked in with patient every 20 minutes).  Patient tolerated ***.  Injection site noted. {injectionreaction:30756} Administered at ***:  ***: *** ***: *** ***: *** ***: *** ***: *** ***: ***   Medication Reconciliation  A drug regimen assessment was performed, including review of allergies, interactions, disease-state management, dosing and immunization history. Medications were reviewed with the patient, including name, instructions, indication, goals of therapy, potential side effects, importance of adherence, and safe use.  Drug interaction(s): ***  Immunizations  Patient is indicated for the influenzae, pneumonia, and shingles vaccinations. Patient has received *** COVID19 vaccines.   PLAN Second Xolair dose will be administered in clinic with 30 minute monitoring period. Scheduled for ***. Patient advised that Epipen must be on hand at each visit. Third Xolair will be administered in clinic with 30 minute monitoring period - appointment will be scheduled at second-dose visit. Rx sent to: {SpecialtyPharmacies:25705}.  Patient provided with pharmacy phone number and advised to call later this week to schedule shipment to clinic. Patient will ned to provide consent. Patient provided with copay card information to provide to pharmacy if quoted copay exceeds $5 per month. Pharmacy team will follow-up with specialty pharmacy to schedule shipment to clinic. Continue maintenance asthma regimen of: ***  All  questions encouraged and answered.  Instructed patient to reach out with any further questions or concerns.  Thank you for allowing pharmacy to participate in this patient's care.  This appointment required *** minutes of patient care (this includes precharting, chart review, review of results, face-to-face care, etc.).

## 2023-07-13 NOTE — Telephone Encounter (Signed)
Received voicemail from patient stating she had to cancel this appointment and could explain why if someone called. I returned call to patient but unable to reach. Left VM  [name], PharmD, MPH, BCPS, CPP Clinical Pharmacist (Rheumatology and Pulmonology)

## 2023-07-14 ENCOUNTER — Other Ambulatory Visit: Payer: Medicare HMO | Admitting: Pharmacist

## 2023-07-14 ENCOUNTER — Other Ambulatory Visit: Payer: Self-pay | Admitting: Cardiology

## 2023-07-14 DIAGNOSIS — E785 Hyperlipidemia, unspecified: Secondary | ICD-10-CM

## 2023-07-14 DIAGNOSIS — I25118 Atherosclerotic heart disease of native coronary artery with other forms of angina pectoris: Secondary | ICD-10-CM

## 2023-07-14 NOTE — Telephone Encounter (Signed)
Refill Request.  

## 2023-07-16 ENCOUNTER — Other Ambulatory Visit: Payer: Self-pay | Admitting: Cardiovascular Disease

## 2023-07-16 DIAGNOSIS — I4891 Unspecified atrial fibrillation: Secondary | ICD-10-CM

## 2023-07-21 ENCOUNTER — Other Ambulatory Visit: Payer: Self-pay

## 2023-07-21 ENCOUNTER — Other Ambulatory Visit: Payer: Self-pay | Admitting: Family Medicine

## 2023-07-21 DIAGNOSIS — G629 Polyneuropathy, unspecified: Secondary | ICD-10-CM

## 2023-07-25 ENCOUNTER — Other Ambulatory Visit: Payer: Self-pay | Admitting: Cardiovascular Disease

## 2023-07-25 DIAGNOSIS — R609 Edema, unspecified: Secondary | ICD-10-CM

## 2023-07-29 ENCOUNTER — Other Ambulatory Visit: Payer: Self-pay | Admitting: Cardiovascular Disease

## 2023-07-29 DIAGNOSIS — R609 Edema, unspecified: Secondary | ICD-10-CM

## 2023-07-30 ENCOUNTER — Ambulatory Visit: Payer: Medicare HMO | Attending: Cardiology | Admitting: Cardiology

## 2023-07-30 ENCOUNTER — Encounter: Payer: Self-pay | Admitting: Cardiology

## 2023-07-30 VITALS — BP 134/84 | HR 68 | Ht 65.0 in | Wt 199.6 lb

## 2023-07-30 DIAGNOSIS — G4733 Obstructive sleep apnea (adult) (pediatric): Secondary | ICD-10-CM

## 2023-07-30 DIAGNOSIS — I259 Chronic ischemic heart disease, unspecified: Secondary | ICD-10-CM | POA: Diagnosis not present

## 2023-07-30 DIAGNOSIS — I10 Essential (primary) hypertension: Secondary | ICD-10-CM | POA: Diagnosis not present

## 2023-07-30 DIAGNOSIS — E785 Hyperlipidemia, unspecified: Secondary | ICD-10-CM

## 2023-07-30 DIAGNOSIS — I5042 Chronic combined systolic (congestive) and diastolic (congestive) heart failure: Secondary | ICD-10-CM | POA: Diagnosis not present

## 2023-07-30 DIAGNOSIS — E1169 Type 2 diabetes mellitus with other specified complication: Secondary | ICD-10-CM

## 2023-07-30 DIAGNOSIS — G4710 Hypersomnia, unspecified: Secondary | ICD-10-CM | POA: Diagnosis not present

## 2023-07-30 DIAGNOSIS — I255 Ischemic cardiomyopathy: Secondary | ICD-10-CM

## 2023-07-30 DIAGNOSIS — I739 Peripheral vascular disease, unspecified: Secondary | ICD-10-CM | POA: Diagnosis not present

## 2023-07-30 DIAGNOSIS — I25118 Atherosclerotic heart disease of native coronary artery with other forms of angina pectoris: Secondary | ICD-10-CM

## 2023-07-30 MED ORDER — PRALUENT 75 MG/ML ~~LOC~~ SOAJ: 75.0000 mg | SUBCUTANEOUS | 12 refills | Status: AC

## 2023-07-30 NOTE — Progress Notes (Signed)
 Cardiology Office Note:  .   Date:  07/30/2023  ID:  [name], DOB 1961-02-06, MRN [id number] PCP: Hope Merle, MD  [facility] Providers Cardiologist:  Evalene Lunger, MD    History of Present Illness: .   [name] is a 63 y.o. female with past medical history of CAD status post CABG, chronic systolic diastolic congestive heart failure, ischemic abnormal, hypertension, hyperlipidemia, diabetes, COPD, peripheral artery disease, and sleep apnea presents today for follow-up.   [name] is a 63 year old female with a previously mentioned past medical history including coronary artery disease status post CABG x 4 in 2012, chronic combined systolic and diastolic congestive heart failure, ischemic cardiomyopathy, hypertension, hyperlipidemia, diabetes, COPD, peripheral arterial disease status post right SFA atherectomy and PTA in June 2012, and sleep apnea. Stress testing on 04/16/2017 revealed no areas of ischemia.  Echocardiogram showed stable mild LV dysfunction with EF of 40-45%.  Repeat stress testing completed showed poor perfusion uptake on stress imaging with significant GI uptake artifact, no resting images were available for review despite image reprocessing, ejection fraction estimated at 44%, unable to exclude perfusion abnormality, considering altered study if continued symptoms.  Echocardiogram was completed 04/17/2022 which revealed LVEF of 45-50%, no regional wall motion abnormalities, G2 DD, mild to moderate mitral regurgitation, moderate tricuspid regurgitation.   She was last seen in clinic 01/23/2023 by Dr. [name].  At that time she was doing well.  She was continued on her current medication regimen without any changes needed.  There was also no further testing needed to be ordered at that time.  She returns to clinic today stating overall she has been doing well.  She denies any chest pain, shortness of breath peripheral edema or palpitations.  She has had  issues with Repatha  injections as of late.  She is in the past couple months that she was on Repatha  was found down when she does her Repatha  injection she believes that the needle may be larger engage because she is having bleeding, hematomas, and large amount of bruising.  She has self stopped the Repatha  injections at this time due adverse reactions that she is having to injection.  She states that she is compliant with the rest of her medications with no further adverse side effects.  She has not had any emergency department visits or hospitalizations.  ROS: 10 point review of systems has been reviewed and considered negative with exception was been listed in the HPI  Studies Reviewed: SABRA   EKG Interpretation Date/Time:  Thursday July 30 2023 09:13:34 EST Ventricular Rate:  68 PR Interval:  100 QRS Duration:  92 QT Interval:  424 QTC Calculation: 450 R Axis:   -10  Text Interpretation: Sinus rhythm T wave abnormality, consider anterior ischemia When compared with ECG of 23-Jan-2023 11:10, No significant change since last tracing Confirmed by [name] ([id number]) on 07/30/2023 9:17:12 AM    2D echo 04/17/2022 1. Left ventricular ejection fraction, by estimation, is 45 to 50%. The  left ventricle has mildly decreased function. The left ventricle has no  regional wall motion abnormalities. Left ventricular diastolic parameters  are consistent with Grade II  diastolic dysfunction (pseudonormalization). The average left ventricular  global longitudinal strain is -8.0 %.   2. Right ventricular systolic function is moderately reduced. The right  ventricular size is moderately enlarged. There is mildly elevated  pulmonary artery systolic pressure. The estimated right ventricular  systolic pressure is 43.0 mmHg.   3. Left atrial size was  mildly dilated.   4. The mitral valve is normal in structure. Mild to moderate mitral valve  regurgitation. No evidence of mitral stenosis.   5. Tricuspid  valve regurgitation is moderate.   6. The aortic valve is normal in structure. There is mild calcification  of the aortic valve. Aortic valve regurgitation is not visualized. Aortic  valve sclerosis/calcification is present, without any evidence of aortic  stenosis.   7. The inferior vena cava is normal in size with greater than 50%  respiratory variability, suggesting right atrial pressure of 3 mmHg.   Lexiscan  Myoview  04/03/2022 Challenging Myoview  Poor perfusion uptake on stress imaging with significant GI uptake artifact No resting images available for review despite image reprocessing Ejection fraction estimated 44% Unable to exclude perfusion abnormality Consider alternate study Risk Assessment/Calculations:             Physical Exam:   VS:  BP 134/84   Pulse 68   Ht 5' 5 (1.651 m)   Wt 199 lb 9.6 oz (90.5 kg)   SpO2 97%   BMI 33.22 kg/m    Wt Readings from Last 3 Encounters:  07/30/23 199 lb 9.6 oz (90.5 kg)  07/03/23 202 lb 8 oz (91.9 kg)  06/12/23 201 lb (91.2 kg)    GEN: Well nourished, well developed in no acute distress NECK: No JVD; No carotid bruits CARDIAC: RRR, no murmurs, rubs, gallops RESPIRATORY:  Clear to auscultation without rales, wheezing or rhonchi  ABDOMEN: Soft, non-tender, non-distended EXTREMITIES:  No edema; No deformity   ASSESSMENT AND PLAN: .   Combined chronic systolic and diastolic congestive heart failure/ischemic cardiomyopathy with an LVEF of 45-50%, no RWMA, G2 DD, right ventricular systolic function mildly reduced, mildly elevated pulmonary artery systolic pressures, mild to moderate MR and moderate TR.  She is continued on GDMT of Farxiga  10 mg daily, Toprol -XL 50 mg daily, furosemide  20 mg daily.  She continues to deny shortness of breath, peripheral edema, PND, orthopnea, early satiety.  Appears to be euvolemic on exam today.  Encouraged to continue to weigh daily, watch her sodium intake and continue with increasing her  activity.  Coronary artery disease status post CABG with stable angina.  Currently denies any chest pain.  EKG today reveals sinus rhythm with a rate of 68 with left axis deviation and T wave inversions in anterior leads which is unchanged from prior studies.  She is continued on aspirin , statin, Imdur , and Nitrostat  as needed.  No further ischemic evaluation is needed at this time.  Primary hypertension with blood pressure 134/84.  Blood pressures remain stable.  She is continued on Imdur , Toprol , furosemide .  She is encouraged to continue to monitor her blood pressure 1 to 2 hours postmedication administration.  Mixed hyperlipidemia where she had previously been on rosuvastatin  20 mg daily ezetimibe  10 mg daily and Repatha .  Unfortunately she has not had adverse effects to Repatha  injections with bleeding and hematomas.  She has self stopped her Repatha  injections.  She is being sent for an updated lipid panel today.  She is not against injectables but would like to try something other than the Repatha  at this point she is being sent in Praluent  75 mg injection every 2 weeks.  Peripheral arterial disease status post atherectomy to the right SFA on 01/15/2011.  She continues to deny claudication symptoms and does have palpable pulses on exam.  She is continued on Xarelto  2.5 mg twice daily.  OSA with continued compliance with CPAP  Dispo: Patient return to clinic with MD/APP in 6 months or sooner if needed  Signed, Shyteria Lewis, NP

## 2023-07-30 NOTE — Patient Instructions (Signed)
 Medication Instructions:   STOP: Evolocumab  (REPATHA  SURECLICK) 140 MG/ML SOAJ  START: Alirocumab  (PRALUENT ) 75 MG/ML SOAJ - Inject 1 mL (75 mg total) into the skin every 14 (fourteen) days  *If you need a refill on your cardiac medications before your next appointment, please call your pharmacy*  Lab Work: Your provider would like for you to have following labs drawn today lipid panel.   If you have labs (blood work) drawn today and your tests are completely normal, you will receive your results only by: MyChart Message (if you have MyChart) OR A paper copy in the mail If you have any lab test that is abnormal or we need to change your treatment, we will call you to review the results.  Testing/Procedures: - None ordered  Follow-Up: At [facility], you and your health needs are our priority.  As part of our continuing mission to provide you with exceptional heart care, we have created designated Provider Care Teams.  These Care Teams include your primary Cardiologist (physician) and Advanced Practice Providers (APPs -  Physician Assistants and Nurse Practitioners) who all work together to provide you with the care you need, when you need it.  Your next appointment:   6 month(s)  Provider:   Tylene Lunch, NP

## 2023-07-31 LAB — LIPID PANEL
Chol/HDL Ratio: 3.5 {ratio} (ref 0.0–4.4)
Cholesterol, Total: 174 mg/dL (ref 100–199)
HDL: 50 mg/dL (ref >39)
LDL Chol Calc (NIH): 107 mg/dL — ABNORMAL HIGH (ref 0–99)
Triglycerides: 91 mg/dL (ref 0–149)
VLDL Cholesterol Cal: 17 mg/dL (ref 5–40)

## 2023-07-31 NOTE — Progress Notes (Signed)
 LDL or bad cholesterol is 478 which remains above goal of 70 but ideally 55.  Recommend continuing rosuvastatin, ezetimibe, and attempting with Praluent.  After 10 to 12 weeks of being on combination therapy will need another lipid and hepatic panel.

## 2023-08-03 ENCOUNTER — Telehealth: Payer: Self-pay

## 2023-08-03 ENCOUNTER — Telehealth: Payer: Self-pay | Admitting: Family Medicine

## 2023-08-03 ENCOUNTER — Other Ambulatory Visit (HOSPITAL_COMMUNITY): Payer: Self-pay

## 2023-08-03 NOTE — Telephone Encounter (Signed)
 Copied from CRM [id number]. 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 She stated that Dr Hope is aware and that she needs to send an order for her to have both completed. Please call and advise when pt is cleared to schedule.

## 2023-08-03 NOTE — Telephone Encounter (Signed)
 Pharmacy Patient Advocate Encounter   Received notification from CoverMyMeds that prior authorization for PRALUENT  is required/requested.   Insurance verification completed.   The patient is insured through [facility] .   Per test claim: PA required; PA submitted to above mentioned insurance via CoverMyMeds Key/confirmation #/EOC [id number] Status is pending

## 2023-08-04 ENCOUNTER — Other Ambulatory Visit (HOSPITAL_COMMUNITY): Payer: Self-pay

## 2023-08-04 NOTE — Telephone Encounter (Signed)
 Pharmacy Patient Advocate Encounter  Received notification from [facility] that Prior Authorization for PRALUENT  has been APPROVED from 07/29/23 to 07/27/24. Ran test claim, Copay is $0. This test claim was processed through [facility]- copay amounts may vary at other pharmacies due to pharmacy/plan contracts, or as the patient moves through the different stages of their insurance plan.

## 2023-08-05 ENCOUNTER — Telehealth: Payer: Self-pay | Admitting: *Deleted

## 2023-08-05 ENCOUNTER — Other Ambulatory Visit: Payer: Self-pay

## 2023-08-05 ENCOUNTER — Other Ambulatory Visit: Payer: Self-pay | Admitting: Family Medicine

## 2023-08-05 DIAGNOSIS — Z1231 Encounter for screening mammogram for malignant neoplasm of breast: Secondary | ICD-10-CM

## 2023-08-05 DIAGNOSIS — Z79899 Other long term (current) drug therapy: Secondary | ICD-10-CM

## 2023-08-05 NOTE — Telephone Encounter (Signed)
 Called Patient and let her know to request the Neurosurgeon that follows her for this to order the MRI and mammogram.

## 2023-08-05 NOTE — Telephone Encounter (Signed)
 Copied from CRM [id number]. Topic: Clinical - Request for Lab/Test Order >> Aug 05, 2023 11:41 AM [name] wrote: Reason for CRM: She needs a new MRI as soon as possible because she has not had it in the last 1-2 years. Also, she needs a referral to a new neurologist since her old one Dr. Bluford left the practice. Please call [name] at [phone] and also, please send a message through MyChart

## 2023-08-07 NOTE — Telephone Encounter (Signed)
 Mammogram was ordered on 08/05/23 under Dr Clent Ridges & pt scheduled in April.  Last mammo was also ordered by Dr Clent Ridges

## 2023-08-09 DIAGNOSIS — J439 Emphysema, unspecified: Secondary | ICD-10-CM | POA: Diagnosis not present

## 2023-08-09 DIAGNOSIS — D6869 Other thrombophilia: Secondary | ICD-10-CM | POA: Diagnosis not present

## 2023-08-09 DIAGNOSIS — F1221 Cannabis dependence, in remission: Secondary | ICD-10-CM | POA: Diagnosis not present

## 2023-08-09 DIAGNOSIS — I509 Heart failure, unspecified: Secondary | ICD-10-CM | POA: Diagnosis not present

## 2023-08-09 DIAGNOSIS — F441 Dissociative fugue: Secondary | ICD-10-CM | POA: Diagnosis not present

## 2023-08-09 DIAGNOSIS — E1136 Type 2 diabetes mellitus with diabetic cataract: Secondary | ICD-10-CM | POA: Diagnosis not present

## 2023-08-09 DIAGNOSIS — E1151 Type 2 diabetes mellitus with diabetic peripheral angiopathy without gangrene: Secondary | ICD-10-CM | POA: Diagnosis not present

## 2023-08-09 DIAGNOSIS — F1021 Alcohol dependence, in remission: Secondary | ICD-10-CM | POA: Diagnosis not present

## 2023-08-09 DIAGNOSIS — Z008 Encounter for other general examination: Secondary | ICD-10-CM | POA: Diagnosis not present

## 2023-08-09 DIAGNOSIS — I25119 Atherosclerotic heart disease of native coronary artery with unspecified angina pectoris: Secondary | ICD-10-CM | POA: Diagnosis not present

## 2023-08-09 DIAGNOSIS — I130 Hypertensive heart and chronic kidney disease with heart failure and stage 1 through stage 4 chronic kidney disease, or unspecified chronic kidney disease: Secondary | ICD-10-CM | POA: Diagnosis not present

## 2023-08-09 DIAGNOSIS — I4891 Unspecified atrial fibrillation: Secondary | ICD-10-CM | POA: Diagnosis not present

## 2023-08-09 DIAGNOSIS — E1122 Type 2 diabetes mellitus with diabetic chronic kidney disease: Secondary | ICD-10-CM | POA: Diagnosis not present

## 2023-08-10 ENCOUNTER — Other Ambulatory Visit: Payer: Self-pay | Admitting: Family Medicine

## 2023-08-10 DIAGNOSIS — D352 Benign neoplasm of pituitary gland: Secondary | ICD-10-CM

## 2023-08-11 ENCOUNTER — Telehealth: Payer: Self-pay

## 2023-08-11 ENCOUNTER — Telehealth: Payer: Self-pay | Admitting: Family Medicine

## 2023-08-11 NOTE — Telephone Encounter (Signed)
 I called the pt in regards to her appointment with Landry Ferrari, NP tomorrow (08-12-23) due to not having a download for her cpap. Pt was suppose to come in for a follow up. When I called the pt, she stated to me that she is wanting to cancel this appointment due to wanting to go to the [place] office since it is closer to her and better transportation. Please cancel this appointment and make her an appointment for [place]. NFN.

## 2023-08-11 NOTE — Telephone Encounter (Signed)
 Called and informed pt of this and advised if she did not hear anything in 2 weeks to give [place] a call back.

## 2023-08-11 NOTE — Telephone Encounter (Signed)
 Lft pt vm to call ofc to sch MRI. thanks

## 2023-08-12 ENCOUNTER — Ambulatory Visit: Payer: Medicare HMO | Admitting: Primary Care

## 2023-08-13 NOTE — Telephone Encounter (Signed)
Patient has not reached back out about Xolair new start. Closing encounter

## 2023-08-15 NOTE — Progress Notes (Signed)
Patient does not want to start Xolair at [facility] location due to transportation issues. She is not willing to come to [facility] for this reason. Dis enrolling from program

## 2023-08-18 ENCOUNTER — Ambulatory Visit: Payer: Medicare HMO | Admitting: *Deleted

## 2023-08-18 VITALS — Ht 65.0 in | Wt 193.0 lb

## 2023-08-18 DIAGNOSIS — Z0000 Encounter for general adult medical examination without abnormal findings: Secondary | ICD-10-CM | POA: Diagnosis not present

## 2023-08-18 NOTE — Patient Instructions (Signed)
[name] , Thank you for taking time to come for your Medicare Wellness Visit. I appreciate your ongoing commitment to your health goals. Please review the following plan we discussed and let me know if I can assist you in the future.   Referrals/Orders/Follow-Ups/Clinician Recommendations: Unable to find a record of your flu vaccine this season.  This is a list of the screening recommended for you and due dates:  Health Maintenance  Topic Date Due   Zoster (Shingles) Vaccine (1 of 2) Never done   COVID-19 Vaccine (3 - Pfizer risk series) 02/21/2020   Screening for Lung Cancer  03/12/2023   Flu Shot  10/26/2023*   Pap with HPV screening  07/02/2024*   Yearly kidney function blood test for diabetes  11/21/2023   Yearly kidney health urinalysis for diabetes  11/21/2023   Hemoglobin A1C  12/21/2023   Eye exam for diabetics  05/27/2024   Complete foot exam   07/02/2024   Medicare Annual Wellness Visit  08/17/2024   Mammogram  10/30/2024   Colon Cancer Screening  07/15/2025   DTaP/Tdap/Td vaccine (2 - Td or Tdap) 05/04/2029   Pneumonia Vaccine  Completed   DEXA scan (bone density measurement)  Completed   Hepatitis C Screening  Completed   HIV Screening  Completed   HPV Vaccine  Aged Out  *Topic was postponed. The date shown is not the original due date.    Advanced directives: (In Chart) A copy of your advanced directives are scanned into your chart should your provider ever need it.  Next Medicare Annual Wellness Visit scheduled for next year: Yes 08/19/24 @ 10:50

## 2023-08-18 NOTE — Progress Notes (Signed)
Subjective:   [name] is a 63 y.o. female who presents for Medicare Annual (Subsequent) preventive examination.  Visit Complete: Virtual I connected with  [name] on 08/18/23 by a audio enabled telemedicine application and verified that I am speaking with the correct person using two identifiers. This patient declined Interactive audio and Acupuncturist. Therefore the visit was completed with audio only.   Patient Location: Home  Provider Location: Office/Clinic  I discussed the limitations of evaluation and management by telemedicine. The patient expressed understanding and agreed to proceed.  Vital Signs: Because this visit was a virtual/telehealth visit, some criteria may be missing or patient reported. Any vitals not documented were not able to be obtained and vitals that have been documented are patient reported.    Cardiac Risk Factors include: advanced age (>9men, >50 women);diabetes mellitus;dyslipidemia;hypertension;obesity (BMI >30kg/m2)     Objective:    Today's Vitals   08/18/23 0939  Weight: 193 lb (87.5 kg)  Height: 5\' 5"  (1.651 m)   Body mass index is 32.12 kg/m.     08/18/2023    9:57 AM 08/15/2022    3:11 PM 07/15/2022   11:23 AM 03/11/2022    1:21 PM 01/23/2022    9:34 AM 11/22/2021    9:32 AM 08/06/2021   11:31 AM  Advanced Directives  Does Patient Have a Medical Advance Directive? No Yes No No No Yes Yes  Type of Furniture conservator/restorer;Living will    Healthcare Power of [place];Living will Healthcare Power of [place];Living will  Does patient want to make changes to medical advance directive?  No - Patient declined    No - Patient declined No - Patient declined  Copy of Healthcare Power of Attorney in Chart?  Yes - validated most recent copy scanned in chart (See row information)    Yes - validated most recent copy scanned in chart (See row information) Yes - validated most recent copy scanned in chart  (See row information)  Would patient like information on creating a medical advance directive? No - Patient declined    Yes (MAU/Ambulatory/Procedural Areas - Information given)      Current Medications (verified) Outpatient Encounter Medications as of 08/18/2023  Medication Sig   acetaminophen (TYLENOL 8 HOUR) 650 MG CR tablet Take 2 tablets (1,300 mg total) by mouth 2 (two) times daily as needed for pain.   albuterol (VENTOLIN HFA) 108 (90 Base) MCG/ACT inhaler Inhale 2 puffs into the lungs every 6 (six) hours as needed for wheezing or shortness of breath.   aspirin EC 81 MG tablet Take 1 tablet (81 mg total) by mouth daily.   Azelastine HCl 0.15 % SOLN Place 2 sprays into both nostrils daily as needed (allergies).   dapagliflozin propanediol (FARXIGA) 10 MG TABS tablet Take 1 tablet (10 mg total) by mouth daily before breakfast.   dicyclomine (BENTYL) 10 MG capsule Take 1 capsule (10 mg total) by mouth 3 (three) times daily before meals. As needed for abdominal pain   EPINEPHrine 0.3 mg/0.3 mL IJ SOAJ injection Inject one pen into the thigh as needed for anaphylactic reaction. Then CALL 911. Use second pen if needed thereafter   esomeprazole (NEXIUM) 40 MG capsule Take 1 capsule (40 mg total) by mouth daily.   ezetimibe (ZETIA) 10 MG tablet Take 1 tablet (10 mg total) by mouth daily.   fluticasone (FLONASE) 50 MCG/ACT nasal spray Place 2 sprays into both nostrils daily. Prn nasal congestion   Fluticasone-Umeclidin-Vilant (TRELEGY  ELLIPTA) 100-62.5-25 MCG/ACT AEPB Inhale 1 puff into the lungs daily. Rinse mouth   furosemide (LASIX) 20 MG tablet TAKE 1 TABLET BY MOUTH ONCE DAILY AND 1 TABLET AT LUNCH 3 TIMES A WEEK   GEMTESA 75 MG TABS Take 1 tablet by mouth daily.   hydrocortisone cream 1 % Apply topically 2 (two) times daily.   hydrOXYzine (ATARAX) 25 MG tablet Take 1 tablet (25 mg total) by mouth daily as needed for anxiety.   ipratropium (ATROVENT) 0.06 % nasal spray Place 2 sprays into  both nostrils 4 (four) times daily.   ipratropium-albuterol (DUONEB) 0.5-2.5 (3) MG/3ML SOLN USE 1 VIAL IN NEBULIZER TWICE DAILY AS NEEDED FOR WHEEZING OR SHORTNESS OF BREATH   isosorbide mononitrate (IMDUR) 30 MG 24 hr tablet Take 1 tablet (30 mg total) by mouth 2 (two) times daily.   levocetirizine (XYZAL) 5 MG tablet Take 1 tablet (5 mg total) by mouth at bedtime as needed for allergies.   linaclotide (LINZESS) 145 MCG CAPS capsule TAKE 1 CAPSULE BY MOUTH BEFORE BREAKFAST   metoprolol succinate (TOPROL-XL) 50 MG 24 hr tablet Take with or immediately following a meal.   montelukast (SINGULAIR) 10 MG tablet Take 1 tablet (10 mg total) by mouth at bedtime.   nitroGLYCERIN (NITROSTAT) 0.4 MG SL tablet DISSOLVE ONE TABLET UNDER THE TONGUE EVERY 5 MINUTES AS NEEDED FOR CHEST PAIN.  DO NOT EXCEED A TOTAL OF 3 DOSES IN 15 MINUTES   olopatadine (PATANOL) 0.1 % ophthalmic solution INSTILL 1 DROP INTO EACH EYE TWICE DAILY   omalizumab (XOLAIR) 300 MG/2ML injection Inject 300mg  into the skin every 14 days (total dose 375mg  every 14 days). Courier to pulm: [address], [address], [place] [address]. Appt on 07/01/23   OneTouch Delica Lancets 33G MISC USE TO CHECK GLUCOSE IN THE MORNING AND AT BEDTIME   ONETOUCH VERIO test strip USE 1 STRIP TO CHECK GLUCOSE TWICE DAILY ONCE  IN  THE  MORNING  AND  ONCE  AT  BEDTIME   PARoxetine (PAXIL) 20 MG tablet Take 1 tablet (20 mg total) by mouth daily.   potassium chloride (KLOR-CON M) 10 MEQ tablet Take 10 mEq by mouth daily.   pregabalin (LYRICA) 75 MG capsule Take 1 capsule by mouth twice daily   rosuvastatin (CRESTOR) 20 MG tablet TAKE 1 TABLET BY MOUTH AT BEDTIME   sodium chloride (OCEAN) 0.65 % SOLN nasal spray Place 2 sprays into both nostrils daily as needed for congestion.   Spacer/Aero-Holding Chambers (AEROCHAMBER MV) inhaler Use as instructed   tiZANidine (ZANAFLEX) 4 MG tablet TAKE 1 TABLET BY MOUTH EVERY 6 HOURS AS NEEDED FOR MUSCLE SPASM   XARELTO  2.5 MG TABS tablet Take 1 tablet by mouth twice daily   Alirocumab (PRALUENT) 75 MG/ML SOAJ Inject 1 mL (75 mg total) into the skin every 14 (fourteen) days. (Patient not taking: Reported on 08/18/2023)   benzonatate (TESSALON) 200 MG capsule Take 1 capsule (200 mg total) by mouth 3 (three) times daily as needed for cough. (Patient not taking: Reported on 08/18/2023)   omalizumab Geoffry Paradise) 75 MG/0.5ML pen Inject 75mg  into the skin every 14 days (total dose 375mg  every 14 days). Courier to pulm: [address], [place] [address]. Appt on 07/01/23 (Patient not taking: Reported on 08/18/2023)   No facility-administered encounter medications on file as of 08/18/2023.    Allergies (verified) Citrullus vulgaris, Latex, Shellfish allergy, Sulfa antibiotics, Sulfonamide derivatives, Misc. sulfonamide containing compounds, Penicillins, Soy allergy (  do not select), Tomato, and Other   History: Past Medical History:  Diagnosis Date   Abnormal feces    Abnormality of both breasts on screening mammogram 05/14/2020   Acute esophagogastric ulcer    Allergy    Anemia    Annual physical exam 01/14/2021   Anxiety and depression 04/05/2018   Arthritis    Asthma    Atypical chest pain 01/20/2014   Back ache 06/19/2015   Bacterial vaginitis    Benign breast cyst in female, right 02/22/2020   Benign neoplasm of ascending colon    Bilateral hip pain 04/19/2020   Blood in stool    Brachial neuritis or radiculitis NOS    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 being followed by neurosurgery/eye doctor and pcp. monitoring size.causes sinus problems   Breast mass, right 01/14/2021   Bronchitis    Bunion of right foot 08/26/2018   Cardiac arrhythmia    CCF (congestive cardiac failure) (HCC) 06/19/2015   Cervical neck pain with evidence of disc disease    C5/6 disease, MRI done late 2012 - no records available   Chronic combined systolic and diastolic CHF (congestive heart failure)  (HCC)    a. 08/2010 Echo: mildly reduced EF 40-45%, mild diffuse hypokinesis; b. 06/2016 Echo: EF 50%, no rwma, mild to mod TR; c. 03/2017 Echo: EF 40-45%, Gr1 DD (prior echo reviewed and EF felt to be lower than reported).   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 12/2018 PFT: No obvious obst/restrictive dzs.   COPD exacerbation (HCC) 03/12/2022   Coronary artery disease    a. PCI of LCX 2003; b. PCI of the LAD 2012 with a (2.5 x 8 mm BMS);  c.s/p CABG 4/12: L-LAD, VG-Dx, VG-OM, VG-RCA (Dr. Donata Clay);  d. 01/2012 MV: inf infarct, attenuation, no ischemia; e. 02/2017 MV: signifi attenuation artifact. Fixed basal antlat/inflat scar vs artifact. Reversible apical lat and mid antlat defect - ? atten vs ischemia. F/u echo w/o wma->Med rx.   Coronary artery disease involving native coronary artery of native heart without angina pectoris 08/23/2010   Depression    Depression    Diabetes mellitus without complication (HCC)    Fall 05/14/2018   Family history of colon cancer    Family history of colon cancer 06/19/2015   Gastritis without bleeding    Generalized headaches    frequent   GERD (gastroesophageal reflux disease)    Headache    Heart murmur    Hematochezia    Hematochezia 01/11/2015   Hepatitis    history of hepatitis b   History of cervical cancer    s/p cryotherapy   History of cervical cancer    s/p cryotherapy   History of colonic polyps 01/11/2015   History of drug abuse (HCC)    cocaine, marijuana, clean since 1989   History of drug abuse (HCC)    cocaine, marijuana, clean since 1989   History of hepatitis B    from eating undercooked liver   History of MI (myocardial infarction)     HTN (hypertension) 12/20/2011  Hyperlipidemia    Hypertension    Insomnia 04/19/2020   Intractable vomiting with nausea    Intractable vomiting with nausea    Ischemic cardiomyopathy    a. 03/2017 Echo: EF 40-45%, Gr1 DD.   Lipoma of right lower extremity 08/30/2020   Mild intermittent asthma 10/13/2019   Myocardial infarction [facility]) 2003, 2012   Need for pneumococcal 20-valent conjugate vaccination 07/06/2022   Numbness and tingling in right hand 02/24/2018   Orthostatic hypotension 04/30/2015   PAD (peripheral artery disease) (HCC)    a. s/p Right SFA atherectomy and PTA 01/15/11; b. 07/2018 ABI: R 1.02, L 1.09.   Pituitary mass (HCC)    a. 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 Mass abuts R cavernous sinus w/o definite invasion.   PND (post-nasal drip) 07/24/2021   Polyp of colon    Pruritus of both eyes 09/21/2020   Right knee pain 12/20/2011   Abnormal MRI right knee 08/29/2016 f/u [facility]      FINDINGS: Medial compartment: [There is abnormal linear signal within the medial meniscus near junction of posterior horn and mid body which abuts the tibial undersurface consistent with nondisplaced tear (5:8-10). The articular cartilage, and subchondral bone are normal. 3 mm bone island (7:14).  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   Seasonal allergies    Shortness of breath 08/23/2010   Qualifier: Diagnosis of   By: Katrinka Blazing RN, Megan       Skin lesion 04/19/2021   Sleep apnea    uses cpap   Smoking history    quit 07/2010   Smoking history 12/16/2010   Quit 07/2010   Status post total knee replacement using cement, right 04/14/2019   Thrush 04/19/2021   Thyroid disease    Urinary incontinence    Urinary incontinence 12/18/2017   Vaginal itching 04/19/2021   Vasomotor flushing 11/24/2016   Past Surgical History:  Procedure Laterality Date   ABDOMINAL HYSTERECTOMY     2003   CHOLECYSTECTOMY  1986   COLONOSCOPY   2008   3 polyps   COLONOSCOPY WITH PROPOFOL N/A 02/06/2015   Procedure: COLONOSCOPY WITH PROPOFOL;  Surgeon: Midge Minium, MD;  Location: [facility] ENDOSCOPY;  Service: Endoscopy;  Laterality: N/A;   COLONOSCOPY WITH PROPOFOL N/A 01/27/2017   Procedure: COLONOSCOPY WITH PROPOFOL;  Surgeon: Midge Minium, MD;  Location: [facility] ENDOSCOPY;  Service: Endoscopy;  Laterality: N/A;   COLONOSCOPY WITH PROPOFOL N/A 07/15/2022   Procedure: COLONOSCOPY WITH PROPOFOL;  Surgeon: Toney Reil, MD;  Location: [facility] ENDOSCOPY;  Service: Gastroenterology;  Laterality: N/A;   CORONARY ANGIOPLASTY     w/ stent placement x2   CORONARY ARTERY BYPASS GRAFT  2012   Dr [name]   CORONARY STENT PLACEMENT  2003   S/P MI   CORONARY STENT PLACEMENT  2007   [place]   ESOPHAGOGASTRODUODENOSCOPY (EGD) WITH PROPOFOL N/A 02/06/2015   Procedure: ESOPHAGOGASTRODUODENOSCOPY (EGD) WITH PROPOFOL;  Surgeon: Midge Minium, MD;  Location: [facility] ENDOSCOPY;  Service: Endoscopy;  Laterality: N/A;   ESOPHAGOGASTRODUODENOSCOPY (EGD) WITH PROPOFOL N/A 01/27/2017   Procedure: ESOPHAGOGASTRODUODENOSCOPY (EGD) WITH PROPOFOL;  Surgeon: Midge Minium, MD;  Location: [facility] ENDOSCOPY;  Service: Endoscopy;  Laterality: N/A;   ESOPHAGOGASTRODUODENOSCOPY (EGD) WITH PROPOFOL N/A 09/01/2019   Procedure: ESOPHAGOGASTRODUODENOSCOPY (EGD) WITH PROPOFOL;  Surgeon: Toney Reil, MD;  Location: [facility] ENDOSCOPY;  Service: Gastroenterology;  Laterality: N/A;   FEMORAL ARTERY STENT  10/2010   right sided (Dr. Excell Seltzer)   KNEE  ARTHROSCOPY WITH MEDIAL MENISECTOMY Right 08/20/2017   Procedure: KNEE ARTHROSCOPY WITH MEDIAL  AND LATERAL MENISECTOMY;  Surgeon: Kennedy Bucker, MD;  Location: [facility] ORS;  Service: Orthopedics;  Laterality: Right;   POSTERIOR CERVICAL LAMINECTOMY N/A 03/08/2018   Procedure: POSTERIOR CERVICAL LAMINECTOMY-C7;  Surgeon: Lucy Chris, MD;  Location: [facility] ORS;  Service: Neurosurgery;  Laterality: N/A;   TONSILLECTOMY     TOTAL KNEE ARTHROPLASTY Right  04/14/2019   Procedure: RIGHT TOTAL KNEE ARTHROPLASTY;  Surgeon: Kennedy Bucker, MD;  Location: [facility] ORS;  Service: Orthopedics;  Laterality: Right;   TUBAL LIGATION     Family History  Problem Relation Age of Onset   Other Mother    Breast cancer Mother 28       breast cancer, late 70's   Cancer Mother        breast   Hypertension Father    Heart failure Father    Diabetes Father    Colon cancer Father 70   Glaucoma Father    Cancer Father        colorectal    Heart disease Father    Other Father        glaucoma   Brain cancer Sister    Arthritis Sister    Diabetes Sister    Hypertension Sister    Kidney disease Sister    Diabetes Brother    Hypertension Brother    Acute myelogenous leukemia Grandson        06/2019   Coronary artery disease Neg Hx    Stroke Neg Hx    Social History   Socioeconomic History   Marital status: Legally Separated    Spouse name: Not on file   Number of children: Not on file   Years of education: Not on file   Highest education level: Not on file  Occupational History   Not on file  Tobacco Use   Smoking status: Former    Current packs/day: 0.00    Average packs/day: 2.0 packs/day for 38.0 years (76.0 ttl pk-yrs)    Types: Cigarettes    Start date: 08/12/1972    Quit date: 08/12/2010    Years since quitting: 13.0   Smokeless tobacco: Never  Vaping Use   Vaping status: Never Used  Substance and Sexual Activity   Alcohol use: No   Drug use: No    Types: Cocaine    Comment: Remote Hx (crack cocaine and marijuana).none since 24yrs plus   Sexual activity: Yes  Other Topics Concern   Not on file  Social History Narrative   Caffeine: 1 cup coffee/day   Lives with family, no pets   Occupation: industrial work, 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   Edu: 11th grade   Activity: no regular exercise   Diet: good water, vegetables daily, low salt diet   Lives with sister and other family    No guns, wears seat belts, safe in relationship    2  kids    GED 1 year of college    Social Drivers of Health   Financial Resource Strain: Low Risk  (08/18/2023)   Overall Financial Resource Strain (CARDIA)    Difficulty of Paying Living Expenses: Not hard at all  Food Insecurity: No Food Insecurity (08/18/2023)   Hunger Vital Sign    Worried About Running Out of Food in the Last Year: Never true    Ran Out of Food in the Last Year: Never true  Transportation Needs: No Transportation Needs (08/18/2023)   PRAPARE - Transportation  Lack of Transportation (Medical): No    Lack of Transportation (Non-Medical): No  Physical Activity: Insufficiently Active (08/18/2023)   Exercise Vital Sign    Days of Exercise per Week: 2 days    Minutes of Exercise per Session: 60 min  Stress: No Stress Concern Present (08/18/2023)   [employer] of Occupational Health - Occupational Stress Questionnaire    Feeling of Stress : Only a little  Social Connections: Moderately Integrated (08/18/2023)   Social Connection and Isolation Panel [NHANES]    Frequency of Communication with Friends and Family: More than three times a week    Frequency of Social Gatherings with Friends and Family: More than three times a week    Attends Religious Services: More than 4 times per year    Active Member of [employer] or Organizations: Yes    Attends Engineer, structural: More than 4 times per year    Marital Status: Separated    Tobacco Counseling Counseling given: Not Answered   Clinical Intake:  Pre-visit preparation completed: Yes  Pain : No/denies pain     BMI - recorded: 32.12 Nutritional Status: BMI > 30  Obese Nutritional Risks: None Diabetes: Yes CBG done?: Yes (FBS 89 per patient) CBG resulted in Enter/ Edit results?: No Did pt. bring in CBG monitor from home?: No  How often do you need to have someone help you when you read instructions, pamphlets, or other written materials from your doctor or pharmacy?: 1 - Never  Interpreter Needed?:  No  Information entered by :: R. Malissa Slay LPN   Activities of Daily Living    08/18/2023    9:42 AM  In your present state of health, do you have any difficulty performing the following activities:  Hearing? 1  Comment has wax build up  Vision? 0  Comment glasses  Difficulty concentrating or making decisions? 0  Walking or climbing stairs? 1  Dressing or bathing? 0  Doing errands, shopping? 0  Preparing Food and eating ? N  Using the Toilet? N  In the past six months, have you accidently leaked urine? Y  Do you have problems with loss of bowel control? Y  Managing your Medications? N  Managing your Finances? N  Housekeeping or managing your Housekeeping? N    Patient Care Team: Dana Allan, MD as PCP - General (Family Medicine) [name] Tollie Pizza, MD as PCP - Cardiology (Cardiology) Otho Ket, RN as [facility], [facility] (Optometry)  Indicate any recent Medical Services you may have received from other than [facility] providers in the past year (date may be approximate).     Assessment:   This is a routine wellness examination for Lorraine.  Hearing/Vision screen Hearing Screening - Comments:: Some wax buildup Vision Screening - Comments:: glasses   Goals Addressed             This Visit's Progress    Patient Stated       Wants to continue to exercise and better eating habits       Depression Screen    08/18/2023    9:51 AM 07/03/2023    9:02 AM 02/23/2023   11:12 AM 10/16/2022    8:57 AM 08/15/2022    3:16 PM 06/24/2022   11:04 AM 03/20/2022   10:17 AM  PHQ 2/9 Scores  PHQ - 2 Score 0 1 0 1 0 5   PHQ- 9 Score 4 5 3 6  0 13   Exception Documentation  Patient refusal    Fall Risk    08/18/2023    9:45 AM 07/03/2023    9:02 AM 02/23/2023   11:12 AM 10/16/2022    8:57 AM 08/15/2022    3:13 PM  Fall Risk   Falls in the past year? 0 0 0 0 0  Number falls in past yr: 0 0 0 0 0  Injury with Fall? 0 0 0 0 0  Risk  for fall due to : No Fall Risks No Fall Risks No Fall Risks No Fall Risks   Follow up Falls prevention discussed;Falls evaluation completed Falls evaluation completed Falls evaluation completed Falls evaluation completed Falls evaluation completed;Falls prevention discussed    MEDICARE RISK AT HOME: Medicare Risk at Home Any stairs in or around the home?: Yes If so, are there any without handrails?: No Home free of loose throw rugs in walkways, pet beds, electrical cords, etc?: Yes Adequate lighting in your home to reduce risk of falls?: Yes Life alert?: Yes Use of a cane, walker or w/c?: No Grab bars in the bathroom?: No Shower chair or bench in shower?: Yes Elevated toilet seat or a handicapped toilet?: No Cognitive Function:        08/18/2023    9:57 AM 08/15/2022    3:24 PM 08/06/2021   11:40 AM 08/03/2020    1:51 PM 08/03/2019    1:29 PM  6CIT Screen  What Year? 0 points 0 points 0 points 0 points 0 points  What month? 0 points 0 points 0 points 0 points 0 points  What time? 0 points 0 points 0 points 0 points 0 points  Count back from 20 0 points 0 points 0 points 0 points 0 points  Months in reverse 4 points 0 points 0 points 4 points 0 points  Repeat phrase 2 points 0 points 0 points 4 points 0 points  Total Score 6 points 0 points 0 points 8 points 0 points    Immunizations Immunization History  Administered Date(s) Administered   Influenza Split 05/22/2015, 04/21/2016   Influenza,inj,Quad PF,6+ Mos 04/05/2018, 03/29/2019, 04/19/2020, 04/19/2021   Influenza-Unspecified 05/20/2017, 05/27/2022   PFIZER(Purple Top)SARS-COV-2 Vaccination 01/03/2020, 01/24/2020   PNEUMOCOCCAL CONJUGATE-20 06/24/2022   RSV,unspecified 05/27/2022   Tdap 05/05/2019    TDAP status: Up to date  Flu Vaccine status: Due, Education has been provided regarding the importance of this vaccine. Advised may receive this vaccine at local pharmacy or Health Dept. Aware to provide a copy of the  vaccination record if obtained from local pharmacy or Health Dept. Verbalized acceptance and understanding.   Pneumococcal vaccine status: Up to date  Covid-19 vaccine status: Information provided on how to obtain vaccines.   Qualifies for Shingles Vaccine? Yes   Zostavax completed No   Shingrix Completed?: No.    Education has been provided regarding the importance of this vaccine. Patient has been advised to call insurance company to determine out of pocket expense if they have not yet received this vaccine. Advised may also receive vaccine at local pharmacy or Health Dept. Verbalized acceptance and understanding.  Screening Tests Health Maintenance  Topic Date Due   Zoster Vaccines- Shingrix (1 of 2) Never done   COVID-19 Vaccine (3 - Pfizer risk series) 02/21/2020   Lung Cancer Screening  03/12/2023   Medicare Annual Wellness (AWV)  08/16/2023   INFLUENZA VACCINE  10/26/2023 (Originally 02/26/2023)   Cervical Cancer Screening (HPV/Pap Cotest)  07/02/2024 (Originally 05/07/2018)   Diabetic kidney evaluation - eGFR measurement  11/21/2023   Diabetic kidney evaluation - Urine ACR  11/21/2023   HEMOGLOBIN A1C  12/21/2023   OPHTHALMOLOGY EXAM  05/27/2024   FOOT EXAM  07/02/2024   MAMMOGRAM  10/30/2024   Colonoscopy  07/15/2025   DTaP/Tdap/Td (2 - Td or Tdap) 05/04/2029   Pneumonia Vaccine 15+ Years old  Completed   DEXA SCAN  Completed   Hepatitis C Screening  Completed   HIV Screening  Completed   HPV VACCINES  Aged Out    Health Maintenance  Health Maintenance Due  Topic Date Due   Zoster Vaccines- Shingrix (1 of 2) Never done   COVID-19 Vaccine (3 - Pfizer risk series) 02/21/2020   Lung Cancer Screening  03/12/2023   Medicare Annual Wellness (AWV)  08/16/2023    Colorectal cancer screening: Type of screening: Colonoscopy. Completed 06/2022. Repeat every 3 years  Mammogram status: Completed 10/2022. Repeat every year  Bone Density status: Completed 09/2018. Results  reflect: Bone density results: NORMAL. Repeat every 2 years.  Lung Cancer Screening: (Low Dose CT Chest recommended if Age 17-80 years, 20 pack-year currently smoking OR have quit w/in 15years.) does qualify. Wants to discuss with Dr. Clent Ridges at her next visit    Additional Screening:  Hepatitis C Screening: does qualify; Completed 12/2015  Vision Screening: Recommended annual ophthalmology exams for early detection of glaucoma and other disorders of the eye. Is the patient up to date with their annual eye exam?  Yes  Who is the provider or what is the name of the office in which the patient attends annual eye exams? [facility] If pt is not established with a provider, would they like to be referred to a provider to establish care? No .   Dental Screening: Recommended annual dental exams for proper oral hygiene  Diabetic Foot Exam: Diabetic Foot Exam: Completed 06/2023  Community Resource Referral / Chronic Care Management: CRR required this visit?  No   CCM required this visit?  No     Plan:     I have personally reviewed and noted the following in the patient's chart:   Medical and social history Use of alcohol, tobacco or illicit drugs  Current medications and supplements including opioid prescriptions. Patient is not currently taking opioid prescriptions. Functional ability and status Nutritional status Physical activity Advanced directives List of other physicians Hospitalizations, surgeries, and ER visits in previous 12 months Vitals Screenings to include cognitive, depression, and falls Referrals and appointments  In addition, I have reviewed and discussed with patient certain preventive protocols, quality metrics, and best practice recommendations. A written personalized care plan for preventive services as well as general preventive health recommendations were provided to patient.     Sydell Axon, LPN   11/03/8117   After Visit Summary: (MyChart) Due to this  being a telephonic visit, the after visit summary with patients personalized plan was offered to patient via MyChart   Nurse Notes: See routing commentss

## 2023-08-20 ENCOUNTER — Ambulatory Visit
Admission: RE | Admit: 2023-08-20 | Discharge: 2023-08-20 | Disposition: A | Discharge status: Home / Self Care | Payer: Medicare HMO | Source: Ambulatory Visit | Attending: Family Medicine | Admitting: Family Medicine

## 2023-08-20 DIAGNOSIS — D352 Benign neoplasm of pituitary gland: Secondary | ICD-10-CM | POA: Diagnosis not present

## 2023-08-20 MED ORDER — GADOBUTROL 1 MMOL/ML IV SOLN
7.5000 mL | Freq: Once | INTRAVENOUS | Status: AC | PRN
  Administered 2023-08-20: 7.5 mL via INTRAVENOUS

## 2023-08-25 ENCOUNTER — Other Ambulatory Visit: Payer: Self-pay | Admitting: Family Medicine

## 2023-08-25 ENCOUNTER — Telehealth: Payer: Self-pay | Admitting: Family Medicine

## 2023-08-25 ENCOUNTER — Encounter: Payer: Self-pay | Admitting: Family Medicine

## 2023-08-25 DIAGNOSIS — B370 Candidal stomatitis: Secondary | ICD-10-CM

## 2023-08-25 NOTE — Telephone Encounter (Signed)
Left message to call office and reschedule appointment/provider sick. Please schedule next available appointment for next week.

## 2023-08-26 ENCOUNTER — Other Ambulatory Visit: Payer: Self-pay

## 2023-08-26 ENCOUNTER — Ambulatory Visit: Payer: Medicare HMO | Admitting: Family Medicine

## 2023-08-26 ENCOUNTER — Ambulatory Visit (INDEPENDENT_AMBULATORY_CARE_PROVIDER_SITE_OTHER): Payer: Medicare HMO

## 2023-08-26 ENCOUNTER — Ambulatory Visit: Payer: Medicare HMO | Admitting: Internal Medicine

## 2023-08-26 VITALS — BP 112/70 | HR 76 | Temp 98.0°F | Resp 16 | Ht 65.0 in | Wt 203.0 lb

## 2023-08-26 DIAGNOSIS — E1159 Type 2 diabetes mellitus with other circulatory complications: Secondary | ICD-10-CM

## 2023-08-26 DIAGNOSIS — B370 Candidal stomatitis: Secondary | ICD-10-CM

## 2023-08-26 DIAGNOSIS — I152 Hypertension secondary to endocrine disorders: Secondary | ICD-10-CM

## 2023-08-26 DIAGNOSIS — Z7984 Long term (current) use of oral hypoglycemic drugs: Secondary | ICD-10-CM

## 2023-08-26 DIAGNOSIS — M545 Low back pain, unspecified: Secondary | ICD-10-CM | POA: Insufficient documentation

## 2023-08-26 DIAGNOSIS — E118 Type 2 diabetes mellitus with unspecified complications: Secondary | ICD-10-CM

## 2023-08-26 DIAGNOSIS — R109 Unspecified abdominal pain: Secondary | ICD-10-CM | POA: Insufficient documentation

## 2023-08-26 DIAGNOSIS — M25551 Pain in right hip: Secondary | ICD-10-CM

## 2023-08-26 DIAGNOSIS — M419 Scoliosis, unspecified: Secondary | ICD-10-CM | POA: Diagnosis not present

## 2023-08-26 DIAGNOSIS — M47816 Spondylosis without myelopathy or radiculopathy, lumbar region: Secondary | ICD-10-CM | POA: Diagnosis not present

## 2023-08-26 DIAGNOSIS — N1831 Chronic kidney disease, stage 3a: Secondary | ICD-10-CM

## 2023-08-26 DIAGNOSIS — G8929 Other chronic pain: Secondary | ICD-10-CM | POA: Diagnosis not present

## 2023-08-26 DIAGNOSIS — I700 Atherosclerosis of aorta: Secondary | ICD-10-CM | POA: Diagnosis not present

## 2023-08-26 LAB — CBC WITH DIFFERENTIAL/PLATELET
Basophils Absolute: 0.1 10*3/uL (ref 0.0–0.1)
Basophils Relative: 0.9 % (ref 0.0–3.0)
Eosinophils Absolute: 0.3 10*3/uL (ref 0.0–0.7)
Eosinophils Relative: 4.4 % (ref 0.0–5.0)
HCT: 44.4 % (ref 36.0–46.0)
Hemoglobin: 14.6 g/dL (ref 12.0–15.0)
Lymphocytes Relative: 48.9 % — ABNORMAL HIGH (ref 12.0–46.0)
Lymphs Abs: 3 10*3/uL (ref 0.7–4.0)
MCHC: 32.9 g/dL (ref 30.0–36.0)
MCV: 97 fL (ref 78.0–100.0)
Monocytes Absolute: 0.5 10*3/uL (ref 0.1–1.0)
Monocytes Relative: 7.5 % (ref 3.0–12.0)
Neutro Abs: 2.4 10*3/uL (ref 1.4–7.7)
Neutrophils Relative %: 38.3 % — ABNORMAL LOW (ref 43.0–77.0)
Platelets: 176 10*3/uL (ref 150.0–400.0)
RBC: 4.58 Mil/uL (ref 3.87–5.11)
RDW: 14 % (ref 11.5–15.5)
WBC: 6.2 10*3/uL (ref 4.0–10.5)

## 2023-08-26 LAB — URINALYSIS, ROUTINE W REFLEX MICROSCOPIC
Bilirubin Urine: NEGATIVE
Hgb urine dipstick: NEGATIVE
Ketones, ur: NEGATIVE
Leukocytes,Ua: NEGATIVE
Nitrite: NEGATIVE
Specific Gravity, Urine: 1.01 (ref 1.000–1.030)
Total Protein, Urine: NEGATIVE
Urine Glucose: 250 — AB
Urobilinogen, UA: 0.2 (ref 0.0–1.0)
pH: 6 (ref 5.0–8.0)

## 2023-08-26 LAB — BASIC METABOLIC PANEL
BUN: 21 mg/dL (ref 6–23)
CO2: 27 meq/L (ref 19–32)
Calcium: 10 mg/dL (ref 8.4–10.5)
Chloride: 104 meq/L (ref 96–112)
Creatinine, Ser: 1.17 mg/dL (ref 0.40–1.20)
GFR: 49.13 mL/min — ABNORMAL LOW (ref >60.00)
Glucose, Bld: 97 mg/dL (ref 70–99)
Potassium: 3.8 meq/L (ref 3.5–5.1)
Sodium: 140 meq/L (ref 135–145)

## 2023-08-26 LAB — HEPATIC FUNCTION PANEL
ALT: 28 U/L (ref 0–35)
AST: 21 U/L (ref 0–37)
Albumin: 4.3 g/dL (ref 3.5–5.2)
Alkaline Phosphatase: 80 U/L (ref 39–117)
Bilirubin, Direct: 0.1 mg/dL (ref 0.0–0.3)
Total Bilirubin: 0.3 mg/dL (ref 0.2–1.2)
Total Protein: 7.2 g/dL (ref 6.0–8.3)

## 2023-08-26 MED ORDER — GEMTESA 75 MG PO TABS: 1.0000 | ORAL_TABLET | Freq: Every day | ORAL | 3 refills | Status: DC

## 2023-08-26 MED ORDER — CLOTRIMAZOLE 10 MG MT TROC: 10.0000 mg | Freq: Two times a day (BID) | OROMUCOSAL | 0 refills | Status: DC

## 2023-08-26 NOTE — Telephone Encounter (Signed)
Filled by previous provider. Pt requesting refill to be sent to [facility]

## 2023-08-26 NOTE — Progress Notes (Signed)
Subjective:    Patient ID: [name], female    DOB: [birth year], 63 y.o.   MRN: [id number]  Patient here for  Chief Complaint  Patient presents with   Thrush   Hip Pain    HPI Work in appt. Pt of Dr Clent Ridges. Worked in for persistent thrush. Reports noticing a burning sensation in her mouth. Has had issues for weeks. Has had nystatin suspension. Will help - some. Persistent symptoms. Also reports increased pain - right hip. Noticed increased pain over the last two weeks.  Hurts with weight bearing. No injury or trauma. Eating. No nausea or vomiting. Bowels moving.    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 being followed by neurosurgery/eye doctor and pcp. monitoring size.causes sinus problems   Breast mass, right 01/14/2021   Bronchitis    Bunion of right foot 08/26/2018   Cardiac arrhythmia    CCF (congestive cardiac failure) (HCC) 06/19/2015   Cervical neck pain with evidence of disc disease    C5/6 disease, MRI done late 2012 - no records available   Chronic combined systolic and diastolic CHF (congestive heart failure) (HCC)    a. 08/2010 Echo: mildly reduced EF 40-45%, mild diffuse hypokinesis; b. 06/2016 Echo: EF 50%, no rwma, mild to mod TR; c. 03/2017 Echo: EF 40-45%, Gr1 DD (prior echo reviewed and EF felt to be lower than reported).   Chronic neck pain 12/18/2017   Chronic pain    Chronic pain of inguinal region 07/24/2021    CLAUDICATION 08/23/2010   Qualifier: Diagnosis of   By: Katrinka Blazing RN, Megan       Cocaine abuse, in remission (HCC)    clean x 24 years   Colon polyp 06/19/2015   2010   Colon polyps    COPD (chronic obstructive pulmonary disease) (HCC)    a. 12/2018 PFT: No obvious obst/restrictive dzs.   COPD exacerbation (HCC) 03/12/2022   Coronary artery disease    a. PCI of LCX 2003; b. PCI of the LAD 2012 with a (2.5 x 8 mm BMS);  c.s/p CABG 4/12: L-LAD, VG-Dx, VG-OM, VG-RCA (Dr. Donata Clay);  d. 01/2012 MV: inf infarct, attenuation, no ischemia; e. 02/2017 MV: signifi attenuation artifact. Fixed basal antlat/inflat scar vs artifact. Reversible apical lat and mid antlat defect - ? atten vs ischemia. F/u echo w/o wma->Med rx.   Coronary artery disease involving native coronary artery of native heart without angina pectoris 08/23/2010   Depression    Depression    Diabetes mellitus without complication [facility])    Fall 05/14/2018   Family history of colon cancer    Family history of colon cancer 06/19/2015   Gastritis without bleeding    Generalized headaches    frequent   GERD (gastroesophageal reflux disease)    Headache    Heart murmur    Hematochezia  Hematochezia 01/11/2015   Hepatitis    history of hepatitis b   History of cervical cancer    s/p cryotherapy   History of cervical cancer    s/p cryotherapy   History of colonic polyps 01/11/2015   History of drug abuse (HCC)    cocaine, marijuana, clean since 1989   History of drug abuse (HCC)    cocaine, marijuana, clean since 1989   History of hepatitis B    from eating undercooked liver   History of MI (myocardial infarction)    HTN (hypertension) 12/20/2011   Hyperlipidemia    Hypertension    Insomnia 04/19/2020   Intractable vomiting with nausea    Intractable vomiting with nausea    Ischemic cardiomyopathy    a. 03/2017 Echo: EF 40-45%, Gr1 DD.   Lipoma of right lower extremity 08/30/2020   Mild intermittent asthma 10/13/2019    Myocardial infarction [facility]) 2003, 2012   Need for pneumococcal 20-valent conjugate vaccination 07/06/2022   Numbness and tingling in right hand 02/24/2018   Orthostatic hypotension 04/30/2015   PAD (peripheral artery disease) (HCC)    a. s/p Right SFA atherectomy and PTA 01/15/11; b. 07/2018 ABI: R 1.02, L 1.09.   Pituitary mass (HCC)    a. 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 Mass abuts R cavernous sinus w/o definite invasion.   PND (post-nasal drip) 07/24/2021   Polyp of colon    Pruritus of both eyes 09/21/2020   Right knee pain 12/20/2011   Abnormal MRI right knee 08/29/2016 f/u [facility]      FINDINGS: Medial compartment: [There is abnormal linear signal within the medial meniscus near junction of posterior horn and mid body which abuts the tibial undersurface consistent with nondisplaced tear (5:8-10). The articular cartilage, and subchondral bone are normal. 3 mm bone island (7:14).  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   Seasonal allergies    Shortness of breath 08/23/2010   Qualifier: Diagnosis of   By: Katrinka Blazing RN, Megan       Skin lesion 04/19/2021   Sleep apnea    uses cpap   Smoking history    quit 07/2010   Smoking history 12/16/2010   Quit 07/2010   Status post total knee replacement using cement, right 04/14/2019   Thrush 04/19/2021   Thyroid disease    Urinary incontinence    Urinary incontinence 12/18/2017   Vaginal itching 04/19/2021   Vasomotor flushing 11/24/2016   Past Surgical History:  Procedure Laterality Date   ABDOMINAL HYSTERECTOMY     2003   CHOLECYSTECTOMY  1986   COLONOSCOPY  2008   3 polyps   COLONOSCOPY WITH PROPOFOL N/A 02/06/2015   Procedure: COLONOSCOPY WITH PROPOFOL;  Surgeon: Midge Minium, MD;  Location: [facility] ENDOSCOPY;  Service: Endoscopy;  Laterality: N/A;   COLONOSCOPY WITH PROPOFOL N/A 01/27/2017   Procedure: COLONOSCOPY WITH PROPOFOL;  Surgeon: Midge Minium, MD;  Location: [facility]  ENDOSCOPY;  Service: Endoscopy;  Laterality: N/A;   COLONOSCOPY WITH PROPOFOL N/A 07/15/2022   Procedure: COLONOSCOPY WITH PROPOFOL;  Surgeon: Toney Reil, MD;  Location: [facility] ENDOSCOPY;  Service: Gastroenterology;  Laterality: N/A;   CORONARY ANGIOPLASTY     w/ stent placement x2   CORONARY ARTERY BYPASS GRAFT  2012   Dr [name]   CORONARY STENT PLACEMENT  2003   S/P MI   CORONARY STENT PLACEMENT  2007   [place]   ESOPHAGOGASTRODUODENOSCOPY (EGD) WITH PROPOFOL N/A  02/06/2015   Procedure: ESOPHAGOGASTRODUODENOSCOPY (EGD) WITH PROPOFOL;  Surgeon: Midge Minium, MD;  Location: [facility] ENDOSCOPY;  Service: Endoscopy;  Laterality: N/A;   ESOPHAGOGASTRODUODENOSCOPY (EGD) WITH PROPOFOL N/A 01/27/2017   Procedure: ESOPHAGOGASTRODUODENOSCOPY (EGD) WITH PROPOFOL;  Surgeon: Midge Minium, MD;  Location: [facility] ENDOSCOPY;  Service: Endoscopy;  Laterality: N/A;   ESOPHAGOGASTRODUODENOSCOPY (EGD) WITH PROPOFOL N/A 09/01/2019   Procedure: ESOPHAGOGASTRODUODENOSCOPY (EGD) WITH PROPOFOL;  Surgeon: Toney Reil, MD;  Location: [facility] ENDOSCOPY;  Service: Gastroenterology;  Laterality: N/A;   FEMORAL ARTERY STENT  10/2010   right sided (Dr. Excell Seltzer)   KNEE ARTHROSCOPY WITH MEDIAL MENISECTOMY Right 08/20/2017   Procedure: KNEE ARTHROSCOPY WITH MEDIAL  AND LATERAL MENISECTOMY;  Surgeon: Kennedy Bucker, MD;  Location: [facility] ORS;  Service: Orthopedics;  Laterality: Right;   POSTERIOR CERVICAL LAMINECTOMY N/A 03/08/2018   Procedure: POSTERIOR CERVICAL LAMINECTOMY-C7;  Surgeon: Lucy Chris, MD;  Location: [facility] ORS;  Service: Neurosurgery;  Laterality: N/A;   TONSILLECTOMY     TOTAL KNEE ARTHROPLASTY Right 04/14/2019   Procedure: RIGHT TOTAL KNEE ARTHROPLASTY;  Surgeon: Kennedy Bucker, MD;  Location: [facility] ORS;  Service: Orthopedics;  Laterality: Right;   TUBAL LIGATION     Family History  Problem Relation Age of Onset   Other Mother    Breast cancer Mother 23       breast cancer, late 90's   Cancer Mother         breast   Hypertension Father    Heart failure Father    Diabetes Father    Colon cancer Father 35   Glaucoma Father    Cancer Father        colorectal    Heart disease Father    Other Father        glaucoma   Brain cancer Sister    Arthritis Sister    Diabetes Sister    Hypertension Sister    Kidney disease Sister    Diabetes Brother    Hypertension Brother    Acute myelogenous leukemia Grandson        06/2019   Coronary artery disease Neg Hx    Stroke Neg Hx    Social History   Socioeconomic History   Marital status: Legally Separated    Spouse name: Not on file   Number of children: Not on file   Years of education: Not on file   Highest education level: Not on file  Occupational History   Not on file  Tobacco Use   Smoking status: Former    Current packs/day: 0.00    Average packs/day: 2.0 packs/day for 38.0 years (76.0 ttl pk-yrs)    Types: Cigarettes    Start date: 08/12/1972    Quit date: 08/12/2010    Years since quitting: 13.0   Smokeless tobacco: Never  Vaping Use   Vaping status: Never Used  Substance and Sexual Activity   Alcohol use: No   Drug use: No    Types: Cocaine    Comment: Remote Hx (crack cocaine and marijuana).none since 80yrs plus   Sexual activity: Yes  Other Topics Concern   Not on file  Social History Narrative   Caffeine: 1 cup coffee/day   Lives with family, no pets   Occupation: industrial work, 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   Edu: 11th grade   Activity: no regular exercise   Diet: good water, vegetables daily, low salt diet   Lives with sister and other family    No guns, wears seat belts, safe in relationship  2 kids    GED 1 year of college    Social Drivers of Health   Financial Resource Strain: Low Risk  (08/18/2023)   Overall Financial Resource Strain (CARDIA)    Difficulty of Paying Living Expenses: Not hard at all  Food Insecurity: No Food Insecurity (08/18/2023)   Hunger Vital Sign    Worried About Running Out of  Food in the Last Year: Never true    Ran Out of Food in the Last Year: Never true  Transportation Needs: No Transportation Needs (08/18/2023)   PRAPARE - Administrator, Civil Service (Medical): No    Lack of Transportation (Non-Medical): No  Physical Activity: Insufficiently Active (08/18/2023)   Exercise Vital Sign    Days of Exercise per Week: 2 days    Minutes of Exercise per Session: 60 min  Stress: No Stress Concern Present (08/18/2023)   [employer] of Occupational Health - Occupational Stress Questionnaire    Feeling of Stress : Only a little  Social Connections: Moderately Integrated (08/18/2023)   Social Connection and Isolation Panel [NHANES]    Frequency of Communication with Friends and Family: More than three times a week    Frequency of Social Gatherings with Friends and Family: More than three times a week    Attends Religious Services: More than 4 times per year    Active Member of [employer] or Organizations: Yes    Attends Engineer, structural: More than 4 times per year    Marital Status: Separated     Review of Systems  Constitutional:  Negative for appetite change and unexpected weight change.  HENT:  Negative for congestion and sinus pressure.   Respiratory:  Negative for cough, chest tightness and shortness of breath.   Cardiovascular:  Negative for chest pain and palpitations.  Gastrointestinal:  Negative for diarrhea, nausea and vomiting.  Genitourinary:  Negative for difficulty urinating and dysuria.  Musculoskeletal:  Negative for myalgias.       Hip pain as outlined.   Skin:  Negative for color change and rash.  Neurological:  Negative for dizziness and headaches.  Psychiatric/Behavioral:  Negative for agitation and dysphoric mood.        Objective:     BP 112/70   Pulse 76   Temp 98 F (36.7 C)   Resp 16   Ht 5\' 5"  (1.651 m)   Wt 203 lb (92.1 kg)   SpO2 98%   BMI 33.78 kg/m  Wt Readings from Last 3 Encounters:   08/26/23 203 lb (92.1 kg)  08/18/23 193 lb (87.5 kg)  07/30/23 199 lb 9.6 oz (90.5 kg)    Physical Exam Vitals reviewed.  Constitutional:      General: She is not in acute distress.    Appearance: Normal appearance.  HENT:     Head: Normocephalic and atraumatic.     Right Ear: External ear normal.     Left Ear: External ear normal.     Mouth/Throat:     Comments: White coating - tongue.  Eyes:     General: No scleral icterus.       Right eye: No discharge.        Left eye: No discharge.     Conjunctiva/sclera: Conjunctivae normal.  Neck:     Thyroid: No thyromegaly.  Cardiovascular:     Rate and Rhythm: Normal rate and regular rhythm.  Pulmonary:     Effort: No respiratory distress.     Breath sounds: Normal  breath sounds. No wheezing.  Abdominal:     General: Bowel sounds are normal.     Palpations: Abdomen is soft.     Tenderness: There is no abdominal tenderness.  Musculoskeletal:        General: No swelling.     Cervical back: Neck supple. No tenderness.     Comments: Increased pain - right groin - increased with weight bearing. Increased low back pain - with SLR. No increased pain with abduction of lower extremity.   Lymphadenopathy:     Cervical: No cervical adenopathy.  Skin:    Findings: No erythema or rash.  Neurological:     Mental Status: She is alert.  Psychiatric:        Mood and Affect: Mood normal.        Behavior: Behavior normal.         Outpatient Encounter Medications as of 08/26/2023  Medication Sig   clotrimazole (MYCELEX) 10 MG troche Take 1 tablet (10 mg total) by mouth in the morning and at bedtime.   acetaminophen (TYLENOL 8 HOUR) 650 MG CR tablet Take 2 tablets (1,300 mg total) by mouth 2 (two) times daily as needed for pain.   albuterol (VENTOLIN HFA) 108 (90 Base) MCG/ACT inhaler Inhale 2 puffs into the lungs every 6 (six) hours as needed for wheezing or shortness of breath.   Alirocumab (PRALUENT) 75 MG/ML SOAJ Inject 1 mL (75 mg  total) into the skin every 14 (fourteen) days. (Patient not taking: Reported on 08/26/2023)   aspirin EC 81 MG tablet Take 1 tablet (81 mg total) by mouth daily.   Azelastine HCl 0.15 % SOLN Place 2 sprays into both nostrils daily as needed (allergies).   dapagliflozin propanediol (FARXIGA) 10 MG TABS tablet Take 1 tablet (10 mg total) by mouth daily before breakfast.   dicyclomine (BENTYL) 10 MG capsule Take 1 capsule (10 mg total) by mouth 3 (three) times daily before meals. As needed for abdominal pain   EPINEPHrine 0.3 mg/0.3 mL IJ SOAJ injection Inject one pen into the thigh as needed for anaphylactic reaction. Then CALL 911. Use second pen if needed thereafter   esomeprazole (NEXIUM) 40 MG capsule Take 1 capsule (40 mg total) by mouth daily.   ezetimibe (ZETIA) 10 MG tablet Take 1 tablet (10 mg total) by mouth daily.   fluticasone (FLONASE) 50 MCG/ACT nasal spray Place 2 sprays into both nostrils daily. Prn nasal congestion   Fluticasone-Umeclidin-Vilant (TRELEGY ELLIPTA) 100-62.5-25 MCG/ACT AEPB Inhale 1 puff into the lungs daily. Rinse mouth   furosemide (LASIX) 20 MG tablet TAKE 1 TABLET BY MOUTH ONCE DAILY AND 1 TABLET AT LUNCH 3 TIMES A WEEK   hydrocortisone cream 1 % Apply topically 2 (two) times daily.   hydrOXYzine (ATARAX) 25 MG tablet Take 1 tablet (25 mg total) by mouth daily as needed for anxiety.   ipratropium (ATROVENT) 0.06 % nasal spray Place 2 sprays into both nostrils 4 (four) times daily.   ipratropium-albuterol (DUONEB) 0.5-2.5 (3) MG/3ML SOLN USE 1 VIAL IN NEBULIZER TWICE DAILY AS NEEDED FOR WHEEZING OR SHORTNESS OF BREATH   isosorbide mononitrate (IMDUR) 30 MG 24 hr tablet Take 1 tablet (30 mg total) by mouth 2 (two) times daily.   levocetirizine (XYZAL) 5 MG tablet Take 1 tablet (5 mg total) by mouth at bedtime as needed for allergies.   linaclotide (LINZESS) 145 MCG CAPS capsule TAKE 1 CAPSULE BY MOUTH BEFORE BREAKFAST   metoprolol succinate (TOPROL-XL) 50 MG 24 hr  tablet  Take with or immediately following a meal.   montelukast (SINGULAIR) 10 MG tablet Take 1 tablet (10 mg total) by mouth at bedtime.   nitroGLYCERIN (NITROSTAT) 0.4 MG SL tablet DISSOLVE ONE TABLET UNDER THE TONGUE EVERY 5 MINUTES AS NEEDED FOR CHEST PAIN.  DO NOT EXCEED A TOTAL OF 3 DOSES IN 15 MINUTES   nystatin (MYCOSTATIN) 100000 UNIT/ML suspension TAKE BY MOUTH 4 TIMES DAILY   olopatadine (PATANOL) 0.1 % ophthalmic solution INSTILL 1 DROP INTO EACH EYE TWICE DAILY   omalizumab (XOLAIR) 300 MG/2ML injection Inject 300mg  into the skin every 14 days (total dose 375mg  every 14 days). Courier to pulm: [address], [place] [address]. Appt on 07/01/23 (Patient not taking: Reported on 08/26/2023)   omalizumab Geoffry Paradise) 75 MG/0.5ML pen Inject 75mg  into the skin every 14 days (total dose 375mg  every 14 days). Courier to pulm: [address], [address], [place] [address]. Appt on 07/01/23 (Patient not taking: Reported on 08/26/2023)   OneTouch Delica Lancets 33G MISC USE TO CHECK GLUCOSE IN THE MORNING AND AT BEDTIME   ONETOUCH VERIO test strip USE 1 STRIP TO CHECK GLUCOSE TWICE DAILY ONCE  IN  THE  MORNING  AND  ONCE  AT  BEDTIME   PARoxetine (PAXIL) 20 MG tablet Take 1 tablet (20 mg total) by mouth daily.   potassium chloride (KLOR-CON M) 10 MEQ tablet Take 10 mEq by mouth daily.   pregabalin (LYRICA) 75 MG capsule Take 1 capsule by mouth twice daily   rosuvastatin (CRESTOR) 20 MG tablet TAKE 1 TABLET BY MOUTH AT BEDTIME   sodium chloride (OCEAN) 0.65 % SOLN nasal spray Place 2 sprays into both nostrils daily as needed for congestion.   Spacer/Aero-Holding Chambers (AEROCHAMBER MV) inhaler Use as instructed   tiZANidine (ZANAFLEX) 4 MG tablet TAKE 1 TABLET BY MOUTH EVERY 6 HOURS AS NEEDED FOR MUSCLE SPASM   XARELTO 2.5 MG TABS tablet Take 1 tablet by mouth twice daily   [DISCONTINUED] benzonatate (TESSALON) 200 MG capsule Take 1 capsule (200 mg total) by mouth 3 (three) times  daily as needed for cough. (Patient not taking: Reported on 08/26/2023)   [DISCONTINUED] GEMTESA 75 MG TABS Take 1 tablet by mouth daily.   No facility-administered encounter medications on file as of 08/26/2023.     Lab Results  Component Value Date   WBC 6.2 08/26/2023   HGB 14.6 08/26/2023   HCT 44.4 08/26/2023   PLT 176.0 08/26/2023   GLUCOSE 97 08/26/2023   CHOL 174 07/30/2023   TRIG 91 07/30/2023   HDL 50 07/30/2023   LDLCALC 107 (H) 07/30/2023   ALT 28 08/26/2023   AST 21 08/26/2023   NA 140 08/26/2023   K 3.8 08/26/2023   CL 104 08/26/2023   CREATININE 1.17 08/26/2023   BUN 21 08/26/2023   CO2 27 08/26/2023   TSH 1.18 11/21/2022   INR 0.9 04/06/2019   HGBA1C 6.3 06/23/2023   MICROALBUR <0.7 11/21/2022       Assessment & Plan:  Midline low back pain, unspecified chronicity, unspecified whether sciatica present Assessment & Plan: On exam, noted to have some low back pain. Check L-S spine xray as outlined. Plan ortho appt as outlined. Follow. Tylenol as directed. Avoid antiinflammatory medication given CKD.   Orders: -     DG Lumbar Spine 2-3 Views; Future  Right hip pain Assessment & Plan: Right hip pain/right groin pain as outlined. Exam as outlined. Check xray - right  hip/pelvis and L-S spine xray. Avoid antiinflammatory medication given CKD. Tylenol as directed. Sees ortho. Contacted [facility]. Appt for next week.   Orders: -     DG HIP UNILAT W OR W/O PELVIS 2-3 VIEWS RIGHT; Future  Abdominal pain, unspecified abdominal location Assessment & Plan: On exam, some minimal abdominal discomfort - suprapubic region. Check cbc, metabolic panel and urine.   Orders: -     CBC with Differential/Platelet -     Basic metabolic panel -     Hepatic function panel -     Urinalysis, Routine w reflex microscopic  Thrush Assessment & Plan: Persistent issue with thrush as outlined. Has used nystatin suspension. Discussed rinsing mouth after inhaler use. Stop nystatin.  Trial of mycelex troches. Follow.    Type 2 diabetes mellitus with complications (HCC) Assessment & Plan: Last A1c 6.3. continues farxiga.    Hypertension associated with diabetes (HCC) Assessment & Plan: Blood pressure doing well. Continue current medication regimen.    Chronic kidney disease, stage 3a (HCC) Assessment & Plan: Avoid antiinflammatory medications.    Other orders -     Clotrimazole; Take 1 tablet (10 mg total) by mouth in the morning and at bedtime.  Dispense: 14 tablet; Refill: 0     Dale Blossom, MD

## 2023-08-26 NOTE — Patient Instructions (Addendum)
Dr [facility]  9:45 AM 09/03/2023

## 2023-08-30 ENCOUNTER — Encounter: Payer: Self-pay | Admitting: Internal Medicine

## 2023-08-30 NOTE — Assessment & Plan Note (Signed)
Right hip pain/right groin pain as outlined. Exam as outlined. Check xray - right hip/pelvis and L-S spine xray. Avoid antiinflammatory medication given CKD. Tylenol as directed. Sees ortho. Contacted [facility]. Appt for next week.

## 2023-08-30 NOTE — Assessment & Plan Note (Signed)
On exam, some minimal abdominal discomfort - suprapubic region. Check cbc, metabolic panel and urine.

## 2023-08-30 NOTE — Assessment & Plan Note (Signed)
Avoid anti-inflammatory medications

## 2023-08-30 NOTE — Assessment & Plan Note (Signed)
Persistent issue with thrush as outlined. Has used nystatin suspension. Discussed rinsing mouth after inhaler use. Stop nystatin. Trial of mycelex troches. Follow.

## 2023-08-30 NOTE — Assessment & Plan Note (Signed)
Blood pressure doing well.   Continue current medication regimen.   

## 2023-08-30 NOTE — Assessment & Plan Note (Signed)
On exam, noted to have some low back pain. Check L-S spine xray as outlined. Plan ortho appt as outlined. Follow. Tylenol as directed. Avoid antiinflammatory medication given CKD.

## 2023-08-30 NOTE — Assessment & Plan Note (Signed)
Last A1c 6.3. continues farxiga.

## 2023-08-31 ENCOUNTER — Telehealth: Payer: Self-pay

## 2023-08-31 NOTE — Telephone Encounter (Signed)
Copied from CRM [id number]. Topic: Clinical - Medical Advice >> Aug 31, 2023  1:54 PM Elizebeth Brooking wrote: Reason for CRM: Kennith Center from [facility] radiology called in regardings speaking to a nurse about  MRI [employer],  Callback number [phone]

## 2023-08-31 NOTE — Telephone Encounter (Signed)
Called Radiology and I advised them I already spoke to pt about results.

## 2023-09-03 ENCOUNTER — Ambulatory Visit: Payer: Medicare HMO | Admitting: Internal Medicine

## 2023-09-03 DIAGNOSIS — M7061 Trochanteric bursitis, right hip: Secondary | ICD-10-CM | POA: Diagnosis not present

## 2023-09-03 DIAGNOSIS — M48061 Spinal stenosis, lumbar region without neurogenic claudication: Secondary | ICD-10-CM | POA: Diagnosis not present

## 2023-09-04 ENCOUNTER — Encounter: Payer: Self-pay | Admitting: Family Medicine

## 2023-09-04 ENCOUNTER — Ambulatory Visit (INDEPENDENT_AMBULATORY_CARE_PROVIDER_SITE_OTHER): Payer: Medicare HMO | Admitting: Family Medicine

## 2023-09-04 VITALS — BP 110/80 | HR 78 | Temp 98.2°F | Resp 17 | Ht 65.0 in | Wt 203.5 lb

## 2023-09-04 DIAGNOSIS — R109 Unspecified abdominal pain: Secondary | ICD-10-CM

## 2023-09-04 DIAGNOSIS — H579 Unspecified disorder of eye and adnexa: Secondary | ICD-10-CM

## 2023-09-04 DIAGNOSIS — Z7984 Long term (current) use of oral hypoglycemic drugs: Secondary | ICD-10-CM

## 2023-09-04 DIAGNOSIS — B370 Candidal stomatitis: Secondary | ICD-10-CM

## 2023-09-04 DIAGNOSIS — M545 Low back pain, unspecified: Secondary | ICD-10-CM | POA: Diagnosis not present

## 2023-09-04 DIAGNOSIS — E118 Type 2 diabetes mellitus with unspecified complications: Secondary | ICD-10-CM

## 2023-09-04 DIAGNOSIS — M25551 Pain in right hip: Secondary | ICD-10-CM | POA: Diagnosis not present

## 2023-09-04 DIAGNOSIS — D352 Benign neoplasm of pituitary gland: Secondary | ICD-10-CM | POA: Diagnosis not present

## 2023-09-04 MED ORDER — OLOPATADINE HCL 0.1 % OP SOLN: OPHTHALMIC | 1 refills | Status: AC

## 2023-09-04 NOTE — Assessment & Plan Note (Signed)
 Repeat MRI brain showed increase size of Pituitary adenoma. Patient has an upcoming appointment with neurosurgery. -Continue to monitor and follow up after neurosurgery consultation.

## 2023-09-04 NOTE — Progress Notes (Signed)
 SUBJECTIVE:   Chief Complaint  Patient presents with   Follow-up    1 week f/u recheck on hip & back pain, thrush, & abdominal pain. Abdominal pain has resolved, thrush slowly improving with mouthwash, Received an injection yesterday for hip & back issues.   HPI Presents for follow up visit Discussed the use of AI scribe software for clinical note transcription with the patient, who gave verbal consent to proceed.  History of Present Illness [name] is a 63 year old female who presents for follow-up of multiple medical issues.  She has a known pituitary tumor and is scheduled to see a neurosurgeon on the 14th of this month. She is concerned about the logistics of traveling to [facility] for the appointment, as her sister needs to take time off work to accompany her. She is considering finding a writer for convenience.  She experiences ongoing shortness of breath. No current infection is noted, and she speculates that asthma might be contributing to her symptoms.  She is currently experiencing oral thrush, which is improving with treatment. Initially prescribed tablets, she is using a liquid formulation instead. She has been inconsistent with the medication, initially taking it only once a day, but has recently started taking it more regularly. She notes that the thrush is 'a little bit better than what it was.'  She recently received two hip injections for bursitis. She reports some improvement, noting that the area is 'not as tender as it was,' but she still experiences some pain. She mentions that she received a knee injection in the past, which was helpful.  She needs a refill on her eye drops for allergies, as the [facility] she frequents no longer carries the brand she uses. She also discusses her Tessalon  Perles, noting that she now has to pay out of pocket for them, whereas Medicaid previously covered the cost.      PERTINENT PMH / PSH: As above  OBJECTIVE:  BP  110/80 (Cuff Size: Normal)   Pulse 78   Temp 98.2 F (36.8 C) (Oral)   Resp 17   Ht 5' 5 (1.651 m)   Wt 203 lb 8 oz (92.3 kg)   SpO2 97%   BMI 33.86 kg/m    Physical Exam Vitals reviewed.  Constitutional:      General: She is not in acute distress.    Appearance: Normal appearance. She is normal weight. She is not ill-appearing, toxic-appearing or diaphoretic.  HENT:     Mouth/Throat:     Mouth: Mucous membranes are moist.     Comments: Candida improving Eyes:     General:        Right eye: No discharge.        Left eye: No discharge.     Conjunctiva/sclera: Conjunctivae normal.  Cardiovascular:     Rate and Rhythm: Normal rate.  Pulmonary:     Effort: Pulmonary effort is normal.  Musculoskeletal:        General: Normal range of motion.  Skin:    General: Skin is warm and dry.  Neurological:     General: No focal deficit present.     Mental Status: She is alert and oriented to person, place, and time. Mental status is at baseline.  Psychiatric:        Mood and Affect: Mood normal.        Behavior: Behavior normal.        Thought Content: Thought content normal.  Judgment: Judgment normal.           09/04/2023   11:34 AM 08/18/2023    9:51 AM 07/03/2023    9:02 AM 02/23/2023   11:12 AM 10/16/2022    8:57 AM  Depression screen PHQ 2/9  Decreased Interest 1 0 1 0 1  Down, Depressed, Hopeless 0 0 0 0 0  PHQ - 2 Score 1 0 1 0 1  Altered sleeping 3 3 1 2 3   Tired, decreased energy 1 1 1  0 0  Change in appetite 0 0 1 1 0  Feeling bad or failure about yourself  0 0 0 0 0  Trouble concentrating 0 0 1 0 1  Moving slowly or fidgety/restless 0 0 0 0 1  Suicidal thoughts 0 0 0 0 0  PHQ-9 Score 5 4 5 3 6   Difficult doing work/chores Not difficult at all Somewhat difficult Somewhat difficult Somewhat difficult Very difficult      09/04/2023   11:34 AM 07/03/2023    9:02 AM 06/24/2022   11:05 AM 02/21/2020    2:13 PM  GAD 7 : Generalized Anxiety Score  Nervous,  Anxious, on Edge 0 0 2 3  Control/stop worrying 0 0 3 1  Worry too much - different things 0 1 3 2   Trouble relaxing 1 1 2 1   Restless 0 0 0 0  Easily annoyed or irritable 1 0 2 2  Afraid - awful might happen 0 0  0  Total GAD 7 Score 2 2  9   Anxiety Difficulty Not difficult at all Somewhat difficult Very difficult Somewhat difficult    ASSESSMENT/PLAN:  Midline low back pain, unspecified chronicity, unspecified whether sciatica present  Right hip pain Assessment & Plan: Recent corticosteroid injection administered. Patient reports some improvement, but still experiencing pain.   -Continue to monitor response to injection.     Thrush Assessment & Plan: Improvement noted with use of Nystatin  oral suspension, but inconsistent use due to patient's schedule. Patient also has a prescription for Mcylex tablets, but is not currently taking them.   -Continue Nystatin  oral suspension 4 times daily.   -Consider starting Clotrimazole  tablets if no further improvement with Nystatin .     Abdominal pain, unspecified abdominal location  Pruritus of both eyes Assessment & Plan: Patient reports [facility] no longer carries her prescribed eye drops.   -Refill prescription for artificial tears.    Orders: -     Olopatadine  HCl; INSTILL 1 DROP INTO EACH EYE TWICE DAILY  Dispense: 10 mL; Refill: 1  Type 2 diabetes mellitus with complications (HCC) Assessment & Plan: Continue Farxiga  Follow up in 3 months for repeat A1c   Pituitary macroadenoma (HCC) Assessment & Plan: Repeat MRI brain showed increase size of Pituitary adenoma. Patient has an upcoming appointment with neurosurgery. -Continue to monitor and follow up after neurosurgery consultation.      PDMP reviewed  Return in about 3 months (around 12/02/2023) for PCP, DM.  Glenys Ferrari, MD

## 2023-09-04 NOTE — Assessment & Plan Note (Signed)
 Patient reports [facility] no longer carries her prescribed eye drops.   -Refill prescription for artificial tears.

## 2023-09-04 NOTE — Assessment & Plan Note (Signed)
 Continue Farxiga  Follow up in 3 months for repeat A1c

## 2023-09-04 NOTE — Assessment & Plan Note (Signed)
 Recent corticosteroid injection administered. Patient reports some improvement, but still experiencing pain.   -Continue to monitor response to injection.

## 2023-09-04 NOTE — Patient Instructions (Addendum)
 It was a pleasure meeting you today. Thank you for allowing me to take part in your health care.  Our goals for today as we discussed include:  Continue Nystatin  solution 4 times a day. If no improvement start the Mycelex  two times a day  Follow up with Neurosurgery as scheduled  Refill sent for requested medication  This is a list of the screening recommended for you and due dates:  Health Maintenance  Topic Date Due   Zoster (Shingles) Vaccine (1 of 2) Never done   Screening for Lung Cancer  03/12/2023   COVID-19 Vaccine (3 - Pfizer risk series) 09/11/2023*   Pap with HPV screening  07/02/2024*   Yearly kidney health urinalysis for diabetes  11/21/2023   Hemoglobin A1C  12/21/2023   Eye exam for diabetics  05/27/2024   Complete foot exam   07/02/2024   Medicare Annual Wellness Visit  08/17/2024   Yearly kidney function blood test for diabetes  08/25/2024   Mammogram  10/30/2024   Colon Cancer Screening  07/15/2025   DTaP/Tdap/Td vaccine (2 - Td or Tdap) 05/04/2029   Pneumonia Vaccine  Completed   Flu Shot  Completed   DEXA scan (bone density measurement)  Completed   Hepatitis C Screening  Completed   HIV Screening  Completed   HPV Vaccine  Aged Out  *Topic was postponed. The date shown is not the original due date.      If you have any questions or concerns, please do not hesitate to call the office at [phone].  I look forward to our next visit and until then take care and stay safe.  Regards,   Glenys Ferrari, MD   [facility]

## 2023-09-04 NOTE — Assessment & Plan Note (Signed)
 Improvement noted with use of Nystatin  oral suspension, but inconsistent use due to patient's schedule. Patient also has a prescription for Mcylex tablets, but is not currently taking them.   -Continue Nystatin  oral suspension 4 times daily.   -Consider starting Clotrimazole  tablets if no further improvement with Nystatin .

## 2023-09-10 ENCOUNTER — Encounter: Payer: Self-pay | Admitting: Internal Medicine

## 2023-09-10 ENCOUNTER — Ambulatory Visit: Payer: Medicare HMO | Admitting: Internal Medicine

## 2023-09-10 VITALS — BP 122/78 | HR 73 | Temp 96.9°F | Ht 65.0 in | Wt 199.0 lb

## 2023-09-10 DIAGNOSIS — J4550 Severe persistent asthma, uncomplicated: Secondary | ICD-10-CM

## 2023-09-10 MED ORDER — TRELEGY ELLIPTA 200-62.5-25 MCG/ACT IN AEPB: 1.0000 | INHALATION_SPRAY | Freq: Every day | RESPIRATORY_TRACT | 10 refills | Status: DC

## 2023-09-10 NOTE — Patient Instructions (Addendum)
Recommend increasing Trelegy to 200 once a day Please rinse mouth out after use  Continue albuterol as needed  Avoid Allergens and Irritants Avoid secondhand smoke Avoid SICK contacts Recommend  Masking  when appropriate Recommend Keep up-to-date with vaccinations  Recommend weight loss  Chest wall pain related to muscle spasms and costochondritis Recommend Tylenol as needed

## 2023-09-10 NOTE — Progress Notes (Signed)
@Patient  ID: [name], female    DOB: [birth year], 63 y.o.   MRN: [id number]   CC Follow-up assessment for severe persistent asthma Follow-up assessment for OSA  HPI: 63 year old female, warmer smoker quit in 2012.  Past medical history significant for hypertension, heart failure, asthma/COPD, OSA on CPAP, allergic rhinitis, eczema, fatty liver, GERD, hyperlipidemia, type 2 diabetes, Vit D deficiency.HST 04/25/21 showed mild sleep apena, AHI 13.1/hour with SpO2 low 85%   Follow-up assessment for severe persistent asthma We will increase Trelegy from 100-200 No exacerbation at this time No evidence of heart failure at this time No evidence or signs of infection at this time No respiratory distress No fevers, chills, nausea, vomiting, diarrhea No evidence of lower extremity edema No evidence hemoptysis IgE levels in November 2024 were elevated at 424  At this time patient is refusing biological agents with Xolair therapy   Assessment of OSA Patient is intolerant of CPAP mask I have discussed options of inspire device At this time patient does not want the device I recommend weight loss current weight 200 pounds       Allergies  Allergen Reactions   Citrullus Vulgaris Other (See Comments)    Throat itchy   Latex Rash        Shellfish Allergy Anaphylaxis    Hard shellfish/swelling in throat   Sulfa Antibiotics Anaphylaxis    Swelling in throat.   Sulfonamide Derivatives Anaphylaxis    Swelling in throat.   Misc. Sulfonamide Containing Compounds    Penicillins Itching   Soy Allergy (Obsolete)     Diarrhea/upset stomach   Tomato Itching    Throat itchy - also Raw carrots   Other Swelling    Immunization History  Administered Date(s) Administered   Influenza Split 05/22/2015, 04/21/2016   Influenza,inj,Quad PF,6+ Mos 04/05/2018, 03/29/2019, 04/19/2020, 04/19/2021   Influenza-Unspecified 05/20/2017, 05/27/2022, 06/08/2023   PFIZER(Purple Top)SARS-COV-2  Vaccination 01/03/2020, 01/24/2020   PNEUMOCOCCAL CONJUGATE-20 06/08/2023   RSV,unspecified 05/27/2022   Tdap 05/05/2019    Past Medical History:  Diagnosis Date   Abnormal feces    Abnormality of both breasts on screening mammogram 05/14/2020   Acute esophagogastric ulcer    Allergy    Anemia    Annual physical exam 01/14/2021   Anxiety and depression 04/05/2018   Arthritis    Asthma    Atypical chest pain 01/20/2014   Back ache 06/19/2015   Bacterial vaginitis    Benign breast cyst in female, right 02/22/2020   Benign neoplasm of ascending colon    Bilateral hip pain 04/19/2020   Blood in stool    Brachial neuritis or radiculitis NOS    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 being followed by neurosurgery/eye doctor and pcp. monitoring size.causes sinus problems   Breast mass, right 01/14/2021   Bronchitis    Bunion of right foot 08/26/2018   Cardiac arrhythmia    CCF (congestive cardiac failure) (HCC) 06/19/2015   Cervical neck pain with evidence of disc disease    C5/6 disease, MRI done late 2012 - no records available   Chronic combined systolic and diastolic CHF (congestive heart failure) (HCC)    a. 08/2010 Echo: mildly reduced EF 40-45%, mild diffuse hypokinesis; b. 06/2016 Echo: EF 50%, no rwma, mild to mod TR; c. 03/2017 Echo: EF 40-45%, Gr1 DD (prior echo reviewed and EF felt to be lower than reported).   Chronic neck pain 12/18/2017   Chronic pain    Chronic pain of inguinal region  07/24/2021   CLAUDICATION 08/23/2010   Qualifier: Diagnosis of   By: Katrinka Blazing RN, Megan       Cocaine abuse, in remission (HCC)    clean x 24 years   Colon polyp 06/19/2015   2010   Colon polyps    COPD (chronic obstructive pulmonary disease) (HCC)    a. 12/2018 PFT: No obvious obst/restrictive dzs.   COPD exacerbation (HCC) 03/12/2022   Coronary artery disease    a. PCI of LCX 2003; b. PCI of the LAD 2012 with a (2.5 x 8 mm BMS);  c.s/p CABG 4/12: L-LAD, VG-Dx,  VG-OM, VG-RCA (Dr. Donata Clay);  d. 01/2012 MV: inf infarct, attenuation, no ischemia; e. 02/2017 MV: signifi attenuation artifact. Fixed basal antlat/inflat scar vs artifact. Reversible apical lat and mid antlat defect - ? atten vs ischemia. F/u echo w/o wma->Med rx.   Coronary artery disease involving native coronary artery of native heart without angina pectoris 08/23/2010   Depression    Depression    Diabetes mellitus without complication [facility])    Fall 05/14/2018   Family history of colon cancer    Family history of colon cancer 06/19/2015   Gastritis without bleeding    Generalized headaches    frequent   GERD (gastroesophageal reflux disease)    Headache    Heart murmur    Hematochezia    Hematochezia 01/11/2015   Hepatitis    history of hepatitis b   History of cervical cancer    s/p cryotherapy   History of cervical cancer    s/p cryotherapy   History of colonic polyps 01/11/2015   History of drug abuse (HCC)    cocaine, marijuana, clean since 1989   History of drug abuse (HCC)    cocaine, marijuana, clean since 1989   History of hepatitis B    from eating undercooked liver   History of MI (myocardial infarction)    HTN (hypertension) 12/20/2011   Hyperlipidemia    Hypertension    Insomnia 04/19/2020   Intractable vomiting with nausea    Intractable vomiting with nausea    Ischemic cardiomyopathy    a. 03/2017 Echo: EF 40-45%, Gr1 DD.   Lipoma of right lower extremity 08/30/2020   Mild intermittent asthma 10/13/2019   Myocardial infarction [facility]) 2003, 2012   Need for pneumococcal 20-valent conjugate vaccination 07/06/2022   Numbness and tingling in right hand 02/24/2018   Orthostatic hypotension 04/30/2015   PAD (peripheral artery disease) (HCC)    a. s/p Right SFA atherectomy and PTA 01/15/11; b. 07/2018 ABI: R 1.02, L 1.09.   Pituitary mass (HCC)    a. 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 Mass abuts R cavernous sinus w/o definite  invasion.   PND (post-nasal drip) 07/24/2021   Polyp of colon    Pruritus of both eyes 09/21/2020   Right knee pain 12/20/2011   Abnormal MRI right knee 08/29/2016 f/u [facility]      FINDINGS: Medial compartment: [There is abnormal linear signal within the medial meniscus near junction of posterior horn and mid body which abuts the tibial undersurface consistent with nondisplaced tear (5:8-10). The articular cartilage, and subchondral bone are normal. 3 mm bone island (7:14).  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   Seasonal allergies    Shortness of breath 08/23/2010   Qualifier: Diagnosis of   By: Katrinka Blazing RN, Megan       Skin lesion 04/19/2021  Sleep apnea    uses cpap   Smoking history    quit 07/2010   Smoking history 12/16/2010   Quit 07/2010   Status post total knee replacement using cement, right 04/14/2019   Thrush 04/19/2021   Thyroid disease    Urinary incontinence    Urinary incontinence 12/18/2017   Vaginal itching 04/19/2021   Vasomotor flushing 11/24/2016    Tobacco History: Social History   Tobacco Use  Smoking Status Former   Current packs/day: 0.00   Average packs/day: 2.0 packs/day for 38.0 years (76.0 ttl pk-yrs)   Types: Cigarettes   Start date: 08/12/1972   Quit date: 08/12/2010   Years since quitting: 13.0  Smokeless Tobacco Never   Counseling given: Not Answered   Outpatient Medications Prior to Visit  Medication Sig Dispense Refill   acetaminophen (TYLENOL 8 HOUR) 650 MG CR tablet Take 2 tablets (1,300 mg total) by mouth 2 (two) times daily as needed for pain. 60 tablet 1   albuterol (VENTOLIN HFA) 108 (90 Base) MCG/ACT inhaler Inhale 2 puffs into the lungs every 6 (six) hours as needed for wheezing or shortness of breath. 1 g 12   Alirocumab (PRALUENT) 75 MG/ML SOAJ Inject 1 mL (75 mg total) into the skin every 14 (fourteen) days. (Patient not taking: Reported on 09/04/2023) 2 mL 12   aspirin EC 81 MG tablet Take 1  tablet (81 mg total) by mouth daily. 90 tablet 3   Azelastine HCl 0.15 % SOLN Place 2 sprays into both nostrils daily as needed (allergies). 30 mL 11   clotrimazole (MYCELEX) 10 MG troche Take 1 tablet (10 mg total) by mouth in the morning and at bedtime. 14 tablet 0   dapagliflozin propanediol (FARXIGA) 10 MG TABS tablet Take 1 tablet (10 mg total) by mouth daily before breakfast. 100 tablet 3   dicyclomine (BENTYL) 10 MG capsule Take 1 capsule (10 mg total) by mouth 3 (three) times daily before meals. As needed for abdominal pain 120 capsule 11   EPINEPHrine 0.3 mg/0.3 mL IJ SOAJ injection Inject one pen into the thigh as needed for anaphylactic reaction. Then CALL 911. Use second pen if needed thereafter 2 each 5   esomeprazole (NEXIUM) 40 MG capsule Take 1 capsule (40 mg total) by mouth daily. 90 capsule 3   ezetimibe (ZETIA) 10 MG tablet Take 1 tablet (10 mg total) by mouth daily. 90 tablet 3   fluticasone (FLONASE) 50 MCG/ACT nasal spray Place 2 sprays into both nostrils daily. Prn nasal congestion 16 g 11   Fluticasone-Umeclidin-Vilant (TRELEGY ELLIPTA) 100-62.5-25 MCG/ACT AEPB Inhale 1 puff into the lungs daily. Rinse mouth 1 each 11   furosemide (LASIX) 20 MG tablet TAKE 1 TABLET BY MOUTH ONCE DAILY AND 1 TABLET AT LUNCH 3 TIMES A WEEK 136 tablet 0   GEMTESA 75 MG TABS Take 1 tablet (75 mg total) by mouth daily. 30 tablet 3   hydrocortisone cream 1 % Apply topically 2 (two) times daily. 30 g 0   hydrOXYzine (ATARAX) 25 MG tablet Take 1 tablet (25 mg total) by mouth daily as needed for anxiety. 60 tablet 1   ipratropium (ATROVENT) 0.06 % nasal spray Place 2 sprays into both nostrils 4 (four) times daily. 15 mL 12   ipratropium-albuterol (DUONEB) 0.5-2.5 (3) MG/3ML SOLN USE 1 VIAL IN NEBULIZER TWICE DAILY AS NEEDED FOR WHEEZING OR SHORTNESS OF BREATH 360 mL 3   isosorbide mononitrate (IMDUR) 30 MG 24 hr tablet Take 1 tablet (30 mg total)  by mouth 2 (two) times daily. 180 tablet 3    levocetirizine (XYZAL) 5 MG tablet Take 1 tablet (5 mg total) by mouth at bedtime as needed for allergies. 90 tablet 3   linaclotide (LINZESS) 145 MCG CAPS capsule TAKE 1 CAPSULE BY MOUTH BEFORE BREAKFAST 90 capsule 3   metoprolol succinate (TOPROL-XL) 50 MG 24 hr tablet Take with or immediately following a meal. 90 tablet 3   montelukast (SINGULAIR) 10 MG tablet Take 1 tablet (10 mg total) by mouth at bedtime. 90 tablet 3   nitroGLYCERIN (NITROSTAT) 0.4 MG SL tablet DISSOLVE ONE TABLET UNDER THE TONGUE EVERY 5 MINUTES AS NEEDED FOR CHEST PAIN.  DO NOT EXCEED A TOTAL OF 3 DOSES IN 15 MINUTES 25 tablet 3   nystatin (MYCOSTATIN) 100000 UNIT/ML suspension TAKE BY MOUTH 4 TIMES DAILY 240 mL 0   olopatadine (PATANOL) 0.1 % ophthalmic solution INSTILL 1 DROP INTO EACH EYE TWICE DAILY 10 mL 1   omalizumab (XOLAIR) 300 MG/2ML injection Inject 300mg  into the skin every 14 days (total dose 375mg  every 14 days). Courier to pulm: [address], [place] [address]. Appt on 07/01/23 (Patient not taking: Reported on 09/04/2023) 4 mL 0   omalizumab (XOLAIR) 75 MG/0.5ML pen Inject 75mg  into the skin every 14 days (total dose 375mg  every 14 days). Courier to pulm: [address], [address], [place] [address]. Appt on 07/01/23 (Patient not taking: Reported on 09/04/2023) 1 mL 0   OneTouch Delica Lancets 33G MISC USE TO CHECK GLUCOSE IN THE MORNING AND AT BEDTIME 200 each 0   ONETOUCH VERIO test strip USE 1 STRIP TO CHECK GLUCOSE TWICE DAILY ONCE  IN  THE  MORNING  AND  ONCE  AT  BEDTIME 200 each 0   PARoxetine (PAXIL) 20 MG tablet Take 1 tablet (20 mg total) by mouth daily. 90 tablet 3   potassium chloride (KLOR-CON M) 10 MEQ tablet Take 10 mEq by mouth daily.     pregabalin (LYRICA) 75 MG capsule Take 1 capsule by mouth twice daily 60 capsule 0   rosuvastatin (CRESTOR) 20 MG tablet TAKE 1 TABLET BY MOUTH AT BEDTIME 90 tablet 0   sodium chloride (OCEAN) 0.65 % SOLN nasal spray Place 2 sprays into both  nostrils daily as needed for congestion. 30 mL 11   Spacer/Aero-Holding Chambers (AEROCHAMBER MV) inhaler Use as instructed (Patient not taking: Reported on 09/04/2023) 1 each 0   tiZANidine (ZANAFLEX) 4 MG tablet TAKE 1 TABLET BY MOUTH EVERY 6 HOURS AS NEEDED FOR MUSCLE SPASM 30 tablet 0   XARELTO 2.5 MG TABS tablet Take 1 tablet by mouth twice daily 180 tablet 0   No facility-administered medications prior to visit.    BP 122/78 (BP Location: Right Arm, Cuff Size: Normal)   Pulse 73   Temp (!) 96.9 F (36.1 C)   Ht 5\' 5"  (1.651 m)   Wt 199 lb (90.3 kg)   SpO2 100%   BMI 33.12 kg/m    Review of Systems: Gen:  Denies  fever, sweats, chills weight loss  HEENT: Denies blurred vision, double vision, ear pain, eye pain, hearing loss, nose bleeds, sore throat Cardiac:  No dizziness, chest pain or heaviness, chest tightness,edema, No JVD Resp:   No cough, -sputum production, +shortness of breath,+wheezing, -hemoptysis,  Other:  All other systems negative   Physical Examination:   General Appearance: No distress  EYES PERRLA, EOM intact.   NECK Supple, No JVD Pulmonary:  normal breath sounds, No wheezing.  Musculoskeletal chest wall pain with palpation, chest wall pain with rotation of torso CardiovascularNormal S1,S2.  No m/r/g.   Abdomen: Benign, Soft, non-tender. Neurology UE/LE 5/5 strength, no focal deficits Ext pulses intact, cap refill intact ALL OTHER ROS ARE NEGATIVE    Lab Results:  CBC    Component Value Date/Time   WBC 6.2 08/26/2023 1032   RBC 4.58 08/26/2023 1032   HGB 14.6 08/26/2023 1032   HGB 12.8 02/13/2012 2319   HCT 44.4 08/26/2023 1032   HCT 38.3 02/13/2012 2319   PLT 176.0 08/26/2023 1032   PLT 144 (L) 02/13/2012 2319   MCV 97.0 08/26/2023 1032   MCV 94 02/13/2012 2319   MCH 30.6 03/11/2022 1333   MCHC 32.9 08/26/2023 1032   RDW 14.0 08/26/2023 1032   RDW 13.9 02/13/2012 2319   LYMPHSABS 3.0 08/26/2023 1032   MONOABS 0.5 08/26/2023 1032    EOSABS 0.3 08/26/2023 1032   BASOSABS 0.1 08/26/2023 1032    BMET    Component Value Date/Time   NA 140 08/26/2023 1032   NA 142 01/20/2014 1127   NA 141 02/13/2012 2319   K 3.8 08/26/2023 1032   K 3.6 02/13/2012 2319   CL 104 08/26/2023 1032   CL 106 02/13/2012 2319   CO2 27 08/26/2023 1032   CO2 27 02/13/2012 2319   GLUCOSE 97 08/26/2023 1032   GLUCOSE 92 02/13/2012 2319   BUN 21 08/26/2023 1032   BUN 16 01/20/2014 1127   BUN 16 02/13/2012 2319   CREATININE 1.17 08/26/2023 1032   CREATININE 1.01 (H) 08/21/2020 1014   CALCIUM 10.0 08/26/2023 1032   CALCIUM 8.7 02/13/2012 2319   GFRNONAA >60 05/19/2022 1202   GFRNONAA >60 02/13/2012 2319   GFRAA >60 08/25/2019 1018   GFRAA >60 02/13/2012 2319    BNP    Component Value Date/Time   BNP 1,292 (H) 02/13/2012 2319    ProBNP No results found for: "PROBNP"  Imaging: MR Brain W Wo Contrast Result Date: 08/30/2023 CLINICAL DATA:  Provided history: Pituitary macroadenoma. EXAM: MRI HEAD WITHOUT AND WITH CONTRAST TECHNIQUE: Multiplanar, multiecho pulse sequences of the brain and surrounding structures were obtained without and with intravenous contrast. CONTRAST:  7.35mL GADAVIST GADOBUTROL 1 MMOL/ML IV SOLN COMPARISON:  Prior brain MRI examinations 02/05/2021 and earlier. FINDINGS: Brain: No age-advanced or lobar predominant parenchymal atrophy. Pituitary protocol sequences were acquired, including dynamic post-contrast imaging. 12 x 18 x 13 mm heterogeneously T2 hyperintense and relatively hypoenhancing mass centered within the central and right aspect of the pituitary gland, increased in size since the brain MRI of 02/05/2021 (previously 12 x 15 x 13 mm). The tumor extends lateral to the intercarotid line on the right, and right cavernous sinus invasion is possible (series 26, image 7). Bulging into the suprasellar cistern on the right without mass effect upon the optic chiasm. Leftward deviation of the pituitary stalk. Mild  multifocal T2 FLAIR hyperintense signal abnormality within the cerebral white matter, nonspecific but most often secondary to chronic small vessel ischemia. No cortical encephalomalacia is identified. There is no acute infarct. No chronic intracranial blood products. No extra-axial fluid collection. No midline shift. Vascular: Maintained flow voids within the proximal large arterial vessels. Skull and upper cervical spine: No focal worrisome marrow lesion. Incompletely assessed cervical spondylosis. Degenerative endplate signal changes at C3-C4. Sinuses/Orbits: No mass or acute finding within the imaged orbits. No significant paranasal sinus disease. Impression #1 will be called to the ordering clinician  or representative by the Radiologist Assistant, and communication documented in the PACS or [employer]. IMPRESSION: 1. 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 Possible cavernous sinus invasion on the right. Bulging into the suprasellar cistern without mass effect upon the optic chiasm. 2. Mild multifocal T2 FLAIR hyperintense signal abnormality within the cerebral white matter, nonspecific but most often secondary to chronic small vessel ischemia. Electronically Signed   By: Jackey Loge D.O.   On: 08/30/2023 11:47   DG Lumbar Spine 2-3 Views Result Date: 08/26/2023 CLINICAL DATA:  Low back pain. EXAM: LUMBAR SPINE - 2-3 VIEW COMPARISON:  Lumbar spine radiograph dated 11/20/2011. FINDINGS: Evaluation is limited due to body habitus and degenerative changes. 5 lumbar type vertebra. There is no acute fracture or subluxation of the lumbar spine. Multilevel degenerative changes and scoliosis. The visualized posterior elements are intact. Lower lumbar facet arthropathy. Right upper quadrant cholecystectomy clips. Atherosclerotic calcification of the aorta. The soft tissues are unremarkable. IMPRESSION: 1. No acute findings. 2. Multilevel  degenerative changes and scoliosis. Electronically Signed   By: Elgie Collard M.D.   On: 08/26/2023 11:27   DG HIP UNILAT WITH PELVIS 2-3 VIEWS RIGHT Result Date: 08/26/2023 CLINICAL DATA:  Chronic right hip pain EXAM: DG HIP (WITH OR WITHOUT PELVIS) 2-3V RIGHT COMPARISON:  CT abdomen and pelvis 07/09/2022 FINDINGS: Sclerosis in the pubic bones surrounding the pubic symphysis compatible with osteitis pubis, stable since prior CT. No acute bony abnormality. Specifically, no fracture, subluxation, or dislocation. Hip joints and SI joints symmetric and unremarkable. IMPRESSION: No acute bony abnormality. Electronically Signed   By: Charlett Nose M.D.   On: 08/26/2023 11:26     Assessment & Plan:   63 year old African-American female seen today for follow-up assessment for asthma based on her symptoms with severely persistent who has been assessed for immunological therapy with Xolair in the setting of obstructive sleep apnea(mild AHI 11)   Assessment of severe persistent asthma Patient refuses Xolair therapy at this time Will increase Trelegy from 100 to 200 Rinse mouth after every use Avoid Allergens and Irritants Avoid secondhand smoke Avoid SICK contacts Recommend  Masking  when appropriate Recommend Keep up-to-date with vaccinations   Allergic rhinitis Continue antihistamines Continue nasal sprays   OSA Mild OSA Patient intolerant of CPAP and mask Recommend weight loss  Obesity -recommend significant weight loss -recommend changing diet  Deconditioned state -Recommend increased daily activity and exercise      Costochondritis and muscle pain Tylenol as needed   MEDICATION ADJUSTMENTS/LABS AND TESTS ORDERED: Recommend increasing Trelegy to 200 once a day Please rinse mouth out after use  Continue albuterol as needed  Avoid Allergens and Irritants Avoid secondhand smoke Avoid SICK contacts Recommend  Masking  when appropriate Recommend Keep up-to-date with  vaccinations  Recommend weight loss  Chest wall pain related to muscle spasms and costochondritis Recommend Tylenol as needed   CURRENT MEDICATIONS REVIEWED AT LENGTH WITH PATIENT TODAY   Patient  satisfied with Plan of action and management. All questions answered   Follow up 6 months   I spent a total of 42 minutes reviewing chart data, face-to-face evaluation with the patient, counseling and coordination of care as detailed above.      Lucie Leather, M.D.  [facility]  Medical Director [facility] Medical Director [facility] Cardio-Pulmonary Department

## 2023-09-11 ENCOUNTER — Other Ambulatory Visit: Payer: Self-pay | Admitting: Family Medicine

## 2023-09-11 DIAGNOSIS — E1169 Type 2 diabetes mellitus with other specified complication: Secondary | ICD-10-CM

## 2023-09-21 ENCOUNTER — Other Ambulatory Visit: Payer: Self-pay | Admitting: Family Medicine

## 2023-09-21 DIAGNOSIS — G629 Polyneuropathy, unspecified: Secondary | ICD-10-CM

## 2023-09-21 NOTE — Telephone Encounter (Signed)
 LOV: 09/04/2023  NOV: 12/22/2023

## 2023-09-29 ENCOUNTER — Other Ambulatory Visit: Payer: Self-pay | Admitting: Family Medicine

## 2023-09-29 DIAGNOSIS — K219 Gastro-esophageal reflux disease without esophagitis: Secondary | ICD-10-CM

## 2023-10-09 DIAGNOSIS — D352 Benign neoplasm of pituitary gland: Secondary | ICD-10-CM | POA: Diagnosis not present

## 2023-10-13 ENCOUNTER — Other Ambulatory Visit: Payer: Self-pay | Admitting: Cardiovascular Disease

## 2023-10-13 DIAGNOSIS — I4891 Unspecified atrial fibrillation: Secondary | ICD-10-CM

## 2023-10-13 NOTE — Telephone Encounter (Signed)
 Prescription refill request for Xarelto received.  Indication: PAD/ CAD /ICM Last office visit: 07/30/23  Lionel December NP Weight: 90.5kg Age: 63 Scr: 1.17 on 08/26/23  Epic CrCl: 68.49  Based on above findings Xarelto 2.5mg  twice daily is the appropriate dose.  Refill approved.

## 2023-10-19 ENCOUNTER — Telehealth: Payer: Self-pay

## 2023-10-19 ENCOUNTER — Other Ambulatory Visit: Payer: Self-pay

## 2023-10-19 ENCOUNTER — Ambulatory Visit: Payer: Medicare HMO | Admitting: Gastroenterology

## 2023-10-19 ENCOUNTER — Telehealth: Payer: Self-pay | Admitting: Pharmacy Technician

## 2023-10-19 ENCOUNTER — Other Ambulatory Visit (HOSPITAL_COMMUNITY): Payer: Self-pay

## 2023-10-19 DIAGNOSIS — I779 Disorder of arteries and arterioles, unspecified: Secondary | ICD-10-CM

## 2023-10-19 DIAGNOSIS — I4891 Unspecified atrial fibrillation: Secondary | ICD-10-CM

## 2023-10-19 MED ORDER — RIVAROXABAN 2.5 MG PO TABS: 2.5000 mg | ORAL_TABLET | Freq: Two times a day (BID) | ORAL | 1 refills | Status: DC

## 2023-10-19 NOTE — Addendum Note (Signed)
 Addended by: Malena Peer D on: 10/19/2023 08:29 AM   Modules accepted: Orders

## 2023-10-19 NOTE — Telephone Encounter (Signed)
 Pharmacy Patient Advocate Encounter   Received notification from CoverMyMeds that prior authorization for Xarelto 2.5 is required/requested.   Insurance verification completed.   The patient is insured through [employer] .   Per test claim:  BRAND name with DAW 1 on prescription is preferred by the insurance.  If suggested medication is appropriate, Please send in a new RX and discontinue this one. If not, please advise as to why it's not appropriate so that we may request a Prior Authorization. Please note, some preferred medications may still require a PA.  If the suggested medications have not been trialed and there are no contraindications to their use, the PA will not be submitted, as it will not be approved.     Brand name is $0.00 copay

## 2023-10-19 NOTE — Telephone Encounter (Signed)
 Rx resent as DAW1 and the correct diagnosis code of PAD- was sent w/ dx afib which is not correct.

## 2023-10-19 NOTE — Telephone Encounter (Signed)
 Per pt didn't realize she was at the correct location when she arrived in front of urgent care. Pt was upset because she thought she was not at the correct place pt nose started blooding due to being upset.  PT left and went home. I call pt gave her my direct number incase she got lost for her next appointment.  Next appt 12-08-2023

## 2023-10-27 DIAGNOSIS — G4710 Hypersomnia, unspecified: Secondary | ICD-10-CM | POA: Diagnosis not present

## 2023-10-27 DIAGNOSIS — I259 Chronic ischemic heart disease, unspecified: Secondary | ICD-10-CM | POA: Diagnosis not present

## 2023-10-27 DIAGNOSIS — G4733 Obstructive sleep apnea (adult) (pediatric): Secondary | ICD-10-CM | POA: Diagnosis not present

## 2023-11-13 ENCOUNTER — Ambulatory Visit
Admission: RE | Admit: 2023-11-13 | Discharge: 2023-11-13 | Disposition: A | Discharge status: Home / Self Care | Source: Ambulatory Visit | Attending: Family Medicine | Admitting: Family Medicine

## 2023-11-13 DIAGNOSIS — Z1231 Encounter for screening mammogram for malignant neoplasm of breast: Secondary | ICD-10-CM | POA: Diagnosis not present

## 2023-11-16 ENCOUNTER — Other Ambulatory Visit: Payer: Self-pay | Admitting: Family Medicine

## 2023-11-16 DIAGNOSIS — G629 Polyneuropathy, unspecified: Secondary | ICD-10-CM

## 2023-11-26 ENCOUNTER — Other Ambulatory Visit: Payer: Self-pay | Admitting: Family Medicine

## 2023-12-01 ENCOUNTER — Ambulatory Visit: Admitting: Internal Medicine

## 2023-12-02 ENCOUNTER — Telehealth: Payer: Self-pay

## 2023-12-02 NOTE — Telephone Encounter (Signed)
 Per pt need to cancel [phone] appts due to babysitting great grands kids. Per pt will ca,ll kc to r/s in June. I  gave pt [facility] phone number.

## 2023-12-08 ENCOUNTER — Ambulatory Visit: Admitting: Gastroenterology

## 2023-12-11 ENCOUNTER — Other Ambulatory Visit: Payer: Self-pay | Admitting: Family

## 2023-12-18 ENCOUNTER — Telehealth: Payer: Self-pay | Admitting: *Deleted

## 2023-12-18 ENCOUNTER — Other Ambulatory Visit: Payer: Self-pay | Admitting: Family Medicine

## 2023-12-18 DIAGNOSIS — E1169 Type 2 diabetes mellitus with other specified complication: Secondary | ICD-10-CM

## 2023-12-18 DIAGNOSIS — E1159 Type 2 diabetes mellitus with other circulatory complications: Secondary | ICD-10-CM

## 2023-12-18 DIAGNOSIS — E118 Type 2 diabetes mellitus with unspecified complications: Secondary | ICD-10-CM

## 2023-12-18 NOTE — Telephone Encounter (Signed)
 Pt has lab appt on Tuesday 5/27. Scheduled for a f/u appt on 6/2 with Dr Sueanne Emerald.  Needs future lab order placed prior to appt

## 2023-12-22 ENCOUNTER — Ambulatory Visit: Payer: Medicare HMO | Admitting: Family Medicine

## 2023-12-22 ENCOUNTER — Other Ambulatory Visit

## 2023-12-22 DIAGNOSIS — I2510 Atherosclerotic heart disease of native coronary artery without angina pectoris: Secondary | ICD-10-CM | POA: Diagnosis not present

## 2023-12-22 DIAGNOSIS — E785 Hyperlipidemia, unspecified: Secondary | ICD-10-CM | POA: Diagnosis not present

## 2023-12-22 DIAGNOSIS — F1911 Other psychoactive substance abuse, in remission: Secondary | ICD-10-CM | POA: Diagnosis not present

## 2023-12-22 DIAGNOSIS — N1831 Chronic kidney disease, stage 3a: Secondary | ICD-10-CM | POA: Diagnosis not present

## 2023-12-22 DIAGNOSIS — E1122 Type 2 diabetes mellitus with diabetic chronic kidney disease: Secondary | ICD-10-CM | POA: Diagnosis not present

## 2023-12-22 DIAGNOSIS — D649 Anemia, unspecified: Secondary | ICD-10-CM | POA: Diagnosis not present

## 2023-12-22 DIAGNOSIS — I10 Essential (primary) hypertension: Secondary | ICD-10-CM | POA: Diagnosis not present

## 2023-12-22 DIAGNOSIS — J449 Chronic obstructive pulmonary disease, unspecified: Secondary | ICD-10-CM | POA: Diagnosis not present

## 2023-12-22 DIAGNOSIS — R609 Edema, unspecified: Secondary | ICD-10-CM | POA: Diagnosis not present

## 2023-12-22 DIAGNOSIS — K219 Gastro-esophageal reflux disease without esophagitis: Secondary | ICD-10-CM | POA: Diagnosis not present

## 2023-12-22 DIAGNOSIS — I779 Disorder of arteries and arterioles, unspecified: Secondary | ICD-10-CM | POA: Diagnosis not present

## 2023-12-22 DIAGNOSIS — I509 Heart failure, unspecified: Secondary | ICD-10-CM | POA: Diagnosis not present

## 2023-12-22 LAB — HEMOGLOBIN A1C: Hemoglobin A1C: 6.3

## 2023-12-22 NOTE — Telephone Encounter (Signed)
 Noted

## 2023-12-23 ENCOUNTER — Other Ambulatory Visit: Payer: Self-pay

## 2023-12-23 ENCOUNTER — Other Ambulatory Visit: Payer: Self-pay | Admitting: Family Medicine

## 2023-12-23 DIAGNOSIS — K219 Gastro-esophageal reflux disease without esophagitis: Secondary | ICD-10-CM

## 2023-12-23 MED ORDER — GEMTESA 75 MG PO TABS: 1.0000 | ORAL_TABLET | Freq: Every day | ORAL | 3 refills | Status: DC

## 2023-12-23 NOTE — Telephone Encounter (Signed)
 Gemtesa  75 MG Tablet

## 2023-12-31 ENCOUNTER — Ambulatory Visit (INDEPENDENT_AMBULATORY_CARE_PROVIDER_SITE_OTHER)

## 2023-12-31 ENCOUNTER — Encounter: Payer: Self-pay | Admitting: Family Medicine

## 2023-12-31 ENCOUNTER — Ambulatory Visit: Admitting: Family Medicine

## 2023-12-31 ENCOUNTER — Ambulatory Visit: Payer: Self-pay | Admitting: Family Medicine

## 2023-12-31 VITALS — BP 110/68 | HR 68 | Temp 98.2°F | Resp 20 | Ht 65.0 in | Wt 197.4 lb

## 2023-12-31 DIAGNOSIS — E785 Hyperlipidemia, unspecified: Secondary | ICD-10-CM

## 2023-12-31 DIAGNOSIS — Z7984 Long term (current) use of oral hypoglycemic drugs: Secondary | ICD-10-CM

## 2023-12-31 DIAGNOSIS — J441 Chronic obstructive pulmonary disease with (acute) exacerbation: Secondary | ICD-10-CM | POA: Diagnosis not present

## 2023-12-31 DIAGNOSIS — R053 Chronic cough: Secondary | ICD-10-CM | POA: Diagnosis not present

## 2023-12-31 DIAGNOSIS — F39 Unspecified mood [affective] disorder: Secondary | ICD-10-CM

## 2023-12-31 DIAGNOSIS — J4520 Mild intermittent asthma, uncomplicated: Secondary | ICD-10-CM | POA: Diagnosis not present

## 2023-12-31 DIAGNOSIS — E118 Type 2 diabetes mellitus with unspecified complications: Secondary | ICD-10-CM | POA: Diagnosis not present

## 2023-12-31 DIAGNOSIS — E1159 Type 2 diabetes mellitus with other circulatory complications: Secondary | ICD-10-CM

## 2023-12-31 DIAGNOSIS — E1169 Type 2 diabetes mellitus with other specified complication: Secondary | ICD-10-CM | POA: Diagnosis not present

## 2023-12-31 MED ORDER — PREDNISONE 50 MG PO TABS: ORAL_TABLET | ORAL | 0 refills | Status: DC

## 2023-12-31 MED ORDER — GLIPIZIDE 5 MG PO TABS: 5.0000 mg | ORAL_TABLET | Freq: Every day | ORAL | 0 refills | Status: DC

## 2023-12-31 MED ORDER — HYDROCOD POLI-CHLORPHE POLI ER 10-8 MG/5ML PO SUER: 5.0000 mL | Freq: Two times a day (BID) | ORAL | 0 refills | Status: AC | PRN

## 2023-12-31 MED ORDER — IPRATROPIUM-ALBUTEROL 0.5-2.5 (3) MG/3ML IN SOLN: RESPIRATORY_TRACT | 0 refills | Status: DC

## 2023-12-31 MED ORDER — DOXYCYCLINE HYCLATE 100 MG PO TABS: 100.0000 mg | ORAL_TABLET | Freq: Two times a day (BID) | ORAL | 0 refills | Status: AC

## 2023-12-31 NOTE — Progress Notes (Unsigned)
 SUBJECTIVE:   Chief Complaint  Patient presents with   Medical Management of Chronic Issues    3 month follow up   HPI Presents for follow up chronic disease management  Discussed the use of AI scribe software for clinical note transcription with the patient, who gave verbal consent to proceed.  History of Present Illness [name] is a 63 year old female with COPD who presents for a three-month follow-up and evaluation of a persistent cough.  She has been experiencing a persistent cough for the past two weeks, which began after her great granddaughters were sick. The cough is associated with sinus congestion, sneezing, and post-nasal drip. No fever, although she recorded a temperature of 53F on one occasion, which resolved later that day.  She uses her rescue inhaler twice daily and continues her morning dose of Trelegy. She also uses Atrovent  nasal spray four times a day and takes Singulair  and Xyzal  regularly. She forgot to use her Duoneb due to a refrigerator malfunction that led to the disposal of her medications.  She experiences shortness of breath and uses her rescue inhaler as needed. She has not started allergy shots due to previous illness and muscle aches. She has a history of smoking but has not smoked in a long time.     PERTINENT PMH / PSH: As above  OBJECTIVE:  BP 110/68   Pulse 68   Temp 98.2 F (36.8 C)   Resp 20   Ht 5\' 5"  (1.651 m)   Wt 197 lb 6 oz (89.5 kg)   SpO2 98%   BMI 32.84 kg/m    Physical Exam Vitals reviewed.  Constitutional:      General: She is not in acute distress.    Appearance: Normal appearance. She is normal weight. She is not ill-appearing, toxic-appearing or diaphoretic.  HENT:     Nose: Nose normal. No congestion or rhinorrhea.  Eyes:     General:        Right eye: No discharge.        Left eye: No discharge.     Conjunctiva/sclera: Conjunctivae normal.  Cardiovascular:     Rate and Rhythm: Normal rate and regular  rhythm.     Heart sounds: Normal heart sounds.  Pulmonary:     Breath sounds: Wheezing present.  Abdominal:     General: Bowel sounds are normal.  Musculoskeletal:        General: Normal range of motion.  Skin:    General: Skin is warm and dry.  Neurological:     General: No focal deficit present.     Mental Status: She is alert and oriented to person, place, and time. Mental status is at baseline.  Psychiatric:        Mood and Affect: Mood normal.        Behavior: Behavior normal.        Thought Content: Thought content normal.        Judgment: Judgment normal.           12/31/2023   10:10 AM 09/04/2023   11:34 AM 08/18/2023    9:51 AM 07/03/2023    9:02 AM 02/23/2023   11:12 AM  Depression screen PHQ 2/9  Decreased Interest 1 1 0 1 0  Down, Depressed, Hopeless 0 0 0 0 0  PHQ - 2 Score 1 1 0 1 0  Altered sleeping 2 3 3 1 2   Tired, decreased energy 2 1 1 1  0  Change in  appetite 0 0 0 1 1  Feeling bad or failure about yourself  0 0 0 0 0  Trouble concentrating 0 0 0 1 0  Moving slowly or fidgety/restless 0 0 0 0 0  Suicidal thoughts 0 0 0 0 0  PHQ-9 Score 5 5 4 5 3   Difficult doing work/chores Somewhat difficult Not difficult at all Somewhat difficult Somewhat difficult Somewhat difficult      12/31/2023   10:10 AM 09/04/2023   11:34 AM 07/03/2023    9:02 AM 06/24/2022   11:05 AM  GAD 7 : Generalized Anxiety Score  Nervous, Anxious, on Edge 0 0 0 2  Control/stop worrying 0 0 0 3  Worry too much - different things 0 0 1 3  Trouble relaxing 0 1 1 2   Restless 0 0 0 0  Easily annoyed or irritable 1 1 0 2  Afraid - awful might happen 0 0 0   Total GAD 7 Score 1 2 2    Anxiety Difficulty Not difficult at all Not difficult at all Somewhat difficult Very difficult    ASSESSMENT/PLAN:  Type 2 diabetes mellitus with complications (HCC) Assessment & Plan: Recent A1c 6.3 Continue Farxiga  10 mg daily    Hyperlipidemia associated with type 2 diabetes mellitus  (HCC) Assessment & Plan: Tolerating statin - Continue Crestor  20 mg daily - Check fasting lipids  Orders: -     Lipid panel  COPD with acute exacerbation (HCC) Assessment & Plan: COPD exacerbation likely due to exposure to sick family members and sinus issues. Symptoms include increased cough and congestion. - Order chest x-ray - Doxycycline  100 mg BID x 5 days - Prednisone  50 mg daily x 5 days - Glipizide 5 mg with breakfast for blood glucose >180 while on steroids - Probiotics daily - Tussionex 5 ml BID prn - Reorder nebulizer medication. - Strict return precautions provided  Orders: -     DG Chest 2 View -     Doxycycline  Hyclate; Take 1 tablet (100 mg total) by mouth 2 (two) times daily for 5 days.  Dispense: 10 tablet; Refill: 0 -     predniSONE ; Take 1 tablet daily  Dispense: 5 tablet; Refill: 0 -     Ipratropium-Albuterol ; USE 1 VIAL IN NEBULIZER TWICE DAILY AS NEEDED FOR WHEEZING OR SHORTNESS OF BREATH  Dispense: 360 mL; Refill: 0 -     Hydrocod Poli-Chlorphe Poli ER; Take 5 mLs by mouth every 12 (twelve) hours as needed for up to 5 days.  Dispense: 50 mL; Refill: 0  Mild intermittent asthma, unspecified whether complicated -     Ipratropium-Albuterol ; USE 1 VIAL IN NEBULIZER TWICE DAILY AS NEEDED FOR WHEEZING OR SHORTNESS OF BREATH  Dispense: 360 mL; Refill: 0  Mood disorder (HCC) Assessment & Plan: History anxiety and depression.  Continue Atarax  25 mg daily as needed Continue Paxil  20 mg daily   Other orders -     glipiZIDE; Take 1 tablet (5 mg total) by mouth daily for 5 days. While on steroids if blood glucose >180  Dispense: 5 tablet; Refill: 0    PDMP reviewed  Return in about 6 months (around 07/01/2024) for PCP, DM.  Valli Gaw, MD

## 2023-12-31 NOTE — Patient Instructions (Addendum)
 It was a pleasure meeting you today. Thank you for allowing me to take part in your health care.  Our goals for today as we discussed include:  Chest xray today  Take Doxycycline  100 mg two times a day for 5 days Take Prednisone  50 mg daily for 5 days Take Glipizide 5 mg with breakfast if blood sugar greater than 180 while on Prednisone . Take Probiotics daily Tussionex 5 mls every 12 hours as needed only for worsening cough   Follow up if no improvement in symptoms   You can take Tylenol  and/or Ibuprofen  as needed for fever reduction and pain relief.   For cough: honey 1/2 to 1 teaspoon (you can dilute the honey in water  or another fluid).  You can also use guaifenesin and dextromethorphan for cough. You can use a humidifier for chest congestion and cough.  If you don't have a humidifier, you can sit in the bathroom with the hot shower running.      For sore throat: try warm salt water  gargles, cepacol lozenges, throat spray, warm tea or water  with lemon/honey, popsicles or ice, or OTC cold relief medicine for throat discomfort.   For congestion: take a daily anti-histamine like Zyrtec , Claritin, and a oral decongestant, such as pseudoephedrine.  You can also use Flonase  1-2 sprays in each nostril daily.   It is important to stay hydrated: drink plenty of fluids (water , gatorade/powerade/pedialyte, juices, or teas) to keep your throat moisturized and help further relieve irritation/discomfort.   This is a list of the screening recommended for you and due dates:  Health Maintenance  Topic Date Due   Zoster (Shingles) Vaccine (1 of 2) Never done   COVID-19 Vaccine (3 - Pfizer risk series) 02/21/2020   Screening for Lung Cancer  03/12/2023   Yearly kidney health urinalysis for diabetes  11/21/2023   Pap with HPV screening  07/02/2024*   Flu Shot  02/26/2024   Eye exam for diabetics  05/27/2024   Hemoglobin A1C  06/23/2024   Complete foot exam   07/02/2024   Medicare Annual Wellness  Visit  08/17/2024   Yearly kidney function blood test for diabetes  08/25/2024   Mammogram  11/12/2024   Colon Cancer Screening  07/15/2025   DTaP/Tdap/Td vaccine (2 - Td or Tdap) 05/04/2029   Pneumonia Vaccine  Completed   DEXA scan (bone density measurement)  Completed   Hepatitis C Screening  Completed   HIV Screening  Completed   HPV Vaccine  Aged Out   Meningitis B Vaccine  Aged Out  *Topic was postponed. The date shown is not the original due date.      If you have any questions or concerns, please do not hesitate to call the office at [phone].  I look forward to our next visit and until then take care and stay safe.  Regards,   Valli Gaw, MD   [facility]

## 2024-01-01 ENCOUNTER — Ambulatory Visit: Payer: Self-pay | Admitting: Family Medicine

## 2024-01-01 ENCOUNTER — Encounter: Payer: Self-pay | Admitting: Family Medicine

## 2024-01-01 LAB — LIPID PANEL
Cholesterol: 145 mg/dL (ref 0–200)
HDL: 48 mg/dL (ref >39.00)
LDL Cholesterol: 77 mg/dL (ref 0–99)
NonHDL: 97.26
Total CHOL/HDL Ratio: 3
Triglycerides: 99 mg/dL (ref 0.0–149.0)
VLDL: 19.8 mg/dL (ref 0.0–40.0)

## 2024-01-01 NOTE — Assessment & Plan Note (Addendum)
 COPD exacerbation likely due to exposure to sick family members and sinus issues. Symptoms include increased cough and congestion. - Order chest x-ray - Doxycycline  100 mg BID x 5 days - Prednisone  50 mg daily x 5 days - Glipizide 5 mg with breakfast for blood glucose >180 while on steroids - Probiotics daily - Tussionex 5 ml BID prn - Reorder nebulizer medication. - Strict return precautions provided

## 2024-01-01 NOTE — Assessment & Plan Note (Signed)
 History anxiety and depression.  Continue Atarax  25 mg daily as needed Continue Paxil  20 mg daily

## 2024-01-01 NOTE — Assessment & Plan Note (Signed)
 Tolerating statin - Continue Crestor  20 mg daily - Check fasting lipids

## 2024-01-01 NOTE — Assessment & Plan Note (Addendum)
 Recent A1c 6.3 Continue Farxiga  10 mg daily

## 2024-01-04 ENCOUNTER — Other Ambulatory Visit: Payer: Self-pay | Admitting: Family Medicine

## 2024-01-07 ENCOUNTER — Telehealth: Payer: Self-pay | Admitting: Cardiovascular Disease

## 2024-01-07 ENCOUNTER — Other Ambulatory Visit (HOSPITAL_COMMUNITY): Payer: Self-pay

## 2024-01-07 DIAGNOSIS — R609 Edema, unspecified: Secondary | ICD-10-CM

## 2024-01-07 MED ORDER — FUROSEMIDE 20 MG PO TABS
ORAL_TABLET | ORAL | 0 refills | Status: DC
Start: 2024-01-07 — End: 2024-01-07
  Filled 2024-01-07: qty 136, 96d supply, fill #0

## 2024-01-07 MED ORDER — FUROSEMIDE 20 MG PO TABS
ORAL_TABLET | ORAL | 0 refills | Status: DC
Start: 2024-01-07 — End: 2024-04-01

## 2024-01-07 NOTE — Telephone Encounter (Signed)
*  STAT* If patient is at the pharmacy, call can be transferred to refill team.   1. Which medications need to be refilled? (please list name of each medication and dose if known)   furosemide  (LASIX ) 20 MG tablet   2. Would you like to learn more about the convenience, safety, & potential cost savings by using the [facility]?   3. Are you open to using the [facility] (Type [facility]. ).  4. Which pharmacy/location (including street and city if local pharmacy) is medication to be sent to?  [id number] - [place] (N), [place] - [address]   5. Do they need a 30 day or 90 day supply?     Patient stated her pharmacy [facility]) has not received her refill prescription.  Patient stated she want the refill re-sent to [id number] - [place] ([place]), [place] - [address].

## 2024-01-07 NOTE — Telephone Encounter (Signed)
 RX sent to requested Pharmacy

## 2024-01-07 NOTE — Telephone Encounter (Signed)
*  STAT* If patient is at the pharmacy, call can be transferred to refill team.   1. Which medications need to be refilled? (please list name of each medication and dose if known)   furosemide  (LASIX ) 20 MG tablet    4. Which pharmacy/location (including street and city if local pharmacy) is medication to be sent to?  [id number] -  (N), [place] - [address]     5. Do they need a 30 day or 90 day supply? 90  .  Pt is completely out, she is scheduled for 03/07/24

## 2024-01-07 NOTE — Addendum Note (Signed)
 Addended by: Debrah Fan, Graceanna Theissen L on: 01/07/2024 03:42 PM   Modules accepted: Orders

## 2024-01-07 NOTE — Telephone Encounter (Signed)
 Spoke with patient, verified prescription instructions, and pharmacy. She confirmed all information with no further questions at this time.

## 2024-01-15 ENCOUNTER — Emergency Department

## 2024-01-15 ENCOUNTER — Other Ambulatory Visit: Payer: Self-pay

## 2024-01-15 ENCOUNTER — Emergency Department
Admission: EM | Admit: 2024-01-15 | Discharge: 2024-01-15 | Disposition: A | Discharge status: Home / Self Care | Attending: Emergency Medicine | Admitting: Emergency Medicine

## 2024-01-15 DIAGNOSIS — I700 Atherosclerosis of aorta: Secondary | ICD-10-CM | POA: Diagnosis not present

## 2024-01-15 DIAGNOSIS — D3502 Benign neoplasm of left adrenal gland: Secondary | ICD-10-CM | POA: Diagnosis not present

## 2024-01-15 DIAGNOSIS — M545 Low back pain, unspecified: Secondary | ICD-10-CM | POA: Diagnosis present

## 2024-01-15 DIAGNOSIS — I509 Heart failure, unspecified: Secondary | ICD-10-CM | POA: Insufficient documentation

## 2024-01-15 DIAGNOSIS — Z955 Presence of coronary angioplasty implant and graft: Secondary | ICD-10-CM | POA: Diagnosis not present

## 2024-01-15 DIAGNOSIS — G629 Polyneuropathy, unspecified: Secondary | ICD-10-CM

## 2024-01-15 DIAGNOSIS — E1122 Type 2 diabetes mellitus with diabetic chronic kidney disease: Secondary | ICD-10-CM | POA: Insufficient documentation

## 2024-01-15 DIAGNOSIS — M5441 Lumbago with sciatica, right side: Secondary | ICD-10-CM | POA: Diagnosis not present

## 2024-01-15 DIAGNOSIS — I130 Hypertensive heart and chronic kidney disease with heart failure and stage 1 through stage 4 chronic kidney disease, or unspecified chronic kidney disease: Secondary | ICD-10-CM | POA: Diagnosis not present

## 2024-01-15 DIAGNOSIS — N189 Chronic kidney disease, unspecified: Secondary | ICD-10-CM | POA: Diagnosis not present

## 2024-01-15 DIAGNOSIS — I2510 Atherosclerotic heart disease of native coronary artery without angina pectoris: Secondary | ICD-10-CM | POA: Insufficient documentation

## 2024-01-15 DIAGNOSIS — K5730 Diverticulosis of large intestine without perforation or abscess without bleeding: Secondary | ICD-10-CM | POA: Diagnosis not present

## 2024-01-15 DIAGNOSIS — R109 Unspecified abdominal pain: Secondary | ICD-10-CM | POA: Diagnosis not present

## 2024-01-15 DIAGNOSIS — J449 Chronic obstructive pulmonary disease, unspecified: Secondary | ICD-10-CM | POA: Diagnosis not present

## 2024-01-15 DIAGNOSIS — M549 Dorsalgia, unspecified: Secondary | ICD-10-CM | POA: Diagnosis not present

## 2024-01-15 LAB — COMPREHENSIVE METABOLIC PANEL WITH GFR
ALT: 27 U/L (ref 0–44)
AST: 23 U/L (ref 15–41)
Albumin: 3.8 g/dL (ref 3.5–5.0)
Alkaline Phosphatase: 67 U/L (ref 38–126)
Anion gap: 13 (ref 5–15)
BUN: 13 mg/dL (ref 8–23)
CO2: 21 mmol/L — ABNORMAL LOW (ref 22–32)
Calcium: 9.4 mg/dL (ref 8.9–10.3)
Chloride: 104 mmol/L (ref 98–111)
Creatinine, Ser: 1.03 mg/dL — ABNORMAL HIGH (ref 0.44–1.00)
GFR, Estimated: >60 mL/min (ref >60)
Glucose, Bld: 100 mg/dL — ABNORMAL HIGH (ref 70–99)
Potassium: 3.7 mmol/L (ref 3.5–5.1)
Sodium: 138 mmol/L (ref 135–145)
Total Bilirubin: 1 mg/dL (ref 0.0–1.2)
Total Protein: 7.9 g/dL (ref 6.5–8.1)

## 2024-01-15 LAB — CBC WITH DIFFERENTIAL/PLATELET
Abs Immature Granulocytes: 0.03 10*3/uL (ref 0.00–0.07)
Basophils Absolute: 0 10*3/uL (ref 0.0–0.1)
Basophils Relative: 0 %
Eosinophils Absolute: 0.1 10*3/uL (ref 0.0–0.5)
Eosinophils Relative: 1 %
HCT: 46 % (ref 36.0–46.0)
Hemoglobin: 15.1 g/dL — ABNORMAL HIGH (ref 12.0–15.0)
Immature Granulocytes: 0 %
Lymphocytes Relative: 22 %
Lymphs Abs: 1.8 10*3/uL (ref 0.7–4.0)
MCH: 31.6 pg (ref 26.0–34.0)
MCHC: 32.8 g/dL (ref 30.0–36.0)
MCV: 96.2 fL (ref 80.0–100.0)
Monocytes Absolute: 0.5 10*3/uL (ref 0.1–1.0)
Monocytes Relative: 6 %
Neutro Abs: 5.8 10*3/uL (ref 1.7–7.7)
Neutrophils Relative %: 71 %
Platelets: 171 10*3/uL (ref 150–400)
RBC: 4.78 MIL/uL (ref 3.87–5.11)
RDW: 14.2 % (ref 11.5–15.5)
WBC: 8.2 10*3/uL (ref 4.0–10.5)
nRBC: 0 % (ref 0.0–0.2)

## 2024-01-15 LAB — URINALYSIS, ROUTINE W REFLEX MICROSCOPIC
Bilirubin Urine: NEGATIVE
Glucose, UA: >=500 mg/dL — AB
Hgb urine dipstick: NEGATIVE
Ketones, ur: 5 mg/dL — AB
Leukocytes,Ua: NEGATIVE
Nitrite: NEGATIVE
Protein, ur: NEGATIVE mg/dL
Specific Gravity, Urine: 1.008 (ref 1.005–1.030)
WBC, UA: 0 WBC/hpf (ref 0–5)
pH: 6 (ref 5.0–8.0)

## 2024-01-15 MED ORDER — TIZANIDINE HCL 4 MG PO TABS: 4.0000 mg | ORAL_TABLET | Freq: Three times a day (TID) | ORAL | 0 refills | Status: DC | PRN

## 2024-01-15 MED ORDER — PREDNISONE 20 MG PO TABS: 40.0000 mg | ORAL_TABLET | Freq: Every day | ORAL | 0 refills | Status: AC

## 2024-01-15 MED ORDER — HYDROCODONE-ACETAMINOPHEN 5-325 MG PO TABS: 1.0000 | ORAL_TABLET | Freq: Three times a day (TID) | ORAL | 0 refills | Status: AC | PRN

## 2024-01-15 MED ORDER — ONDANSETRON 4 MG PO TBDP
4.0000 mg | ORAL_TABLET | Freq: Once | ORAL | Status: AC
  Administered 2024-01-15: 4 mg via ORAL
  Filled 2024-01-15: qty 1

## 2024-01-15 MED ORDER — HYDROCODONE-ACETAMINOPHEN 5-325 MG PO TABS
1.0000 | ORAL_TABLET | Freq: Once | ORAL | Status: AC
  Administered 2024-01-15: 1 via ORAL
  Filled 2024-01-15: qty 1

## 2024-01-15 MED ORDER — LIDOCAINE 5 % EX PTCH
1.0000 | MEDICATED_PATCH | Freq: Once | CUTANEOUS | Status: DC
  Administered 2024-01-15: 1 via TRANSDERMAL

## 2024-01-15 MED ORDER — OXYCODONE HCL 5 MG PO TABS
5.0000 mg | ORAL_TABLET | Freq: Once | ORAL | Status: AC
  Administered 2024-01-15: 5 mg via ORAL
  Filled 2024-01-15: qty 1

## 2024-01-15 MED ORDER — LIDOCAINE 5 % EX PTCH: 1.0000 | MEDICATED_PATCH | Freq: Two times a day (BID) | CUTANEOUS | 0 refills | Status: AC | PRN

## 2024-01-15 NOTE — Discharge Instructions (Signed)
 Take the prescription meds as directed. Follow-up with your primary provider as needed.

## 2024-01-15 NOTE — ED Notes (Signed)
 Pt assisted to bathroom and back to bed. Bed alarm turned back on when back in bed.

## 2024-01-15 NOTE — ED Provider Notes (Signed)
 [facility] Emergency Department Provider Note     Event Date/Time   First MD Initiated Contact with Patient 01/15/24 1044     (approximate)   History   Abdominal Pain, Back Pain, and Rectal Bleeding   HPI  [name] is a 62 y.o. female with a history of DM, CHF, kidney stone, CKD, HTN, HLD, asthma, CAD, status post CABG x 4, anticoagulation therapy, and COPD, presenting to the ED endorsing back pain and lower abdominal discomfort, both on the right side.  She localizes her discomfort to the midline lumbar sacral region, noting referral into her right hip and flank.  She also endorses some pain into the right hip and buttocks.  No pain down the the right thigh or beyond the knee.  Patient reports anorexia related to her symptoms.  She reports BRBPR for one episode, last week.  She denies any frank fevers, chills, or sweats.  No reports of urinary frequency, hematuria, urgency, or retention  Physical Exam   Triage Vital Signs: ED Triage Vitals  Encounter Vitals Group     BP 01/15/24 1035 124/85     Girls Systolic BP Percentile --      Girls Diastolic BP Percentile --      Boys Systolic BP Percentile --      Boys Diastolic BP Percentile --      Pulse Rate 01/15/24 1035 84     Resp 01/15/24 1035 16     Temp 01/15/24 1035 98.1 F (36.7 C)     Temp Source 01/15/24 1035 Oral     SpO2 01/15/24 1035 100 %     Weight 01/15/24 1036 190 lb (86.2 kg)     Height 01/15/24 1036 5' 5 (1.651 m)     Head Circumference --      Peak Flow --      Pain Score 01/15/24 1036 10     Pain Loc --      Pain Education --      Exclude from Growth Chart --     Most recent vital signs: Vitals:   01/15/24 1433 01/15/24 1435  BP: 122/84 122/84  Pulse: 83 83  Resp: 17 17  Temp: 98.1 F (36.7 C)   SpO2:  100%    General Awake, no distress. NAD HEENT NCAT. PERRL. EOMI. No rhinorrhea. Mucous membranes are moist. CV:  Good peripheral perfusion. RRR RESP:  Normal  effort. CTA ABD:  No distention.  Soft and nontender.  Normal bowel sounds x 4.  No rebound, guarding, or rigidity noted.  No CVA tenderness elicited MSK:  Normal spinal alignment with tenderness palpation to the lumbar sacral junction on palpation.  Active range of motion of lower extremities bilaterally. NEURO: Cranial nerves II to XII grossly intact.  Normal LE DTRs bilaterally.   ED Results / Procedures / Treatments   Labs (all labs ordered are listed, but only abnormal results are displayed) Labs Reviewed  COMPREHENSIVE METABOLIC PANEL WITH GFR - Abnormal; Notable for the following components:      Result Value   CO2 21 (*)    Glucose, Bld 100 (*)    Creatinine, Ser 1.03 (*)    All other components within normal limits  CBC WITH DIFFERENTIAL/PLATELET - Abnormal; Notable for the following components:   Hemoglobin 15.1 (*)    All other components within normal limits  URINALYSIS, ROUTINE W REFLEX MICROSCOPIC - Abnormal; Notable for the following components:   Color, Urine YELLOW (*)  APPearance CLEAR (*)    Glucose, UA >=500 (*)    Ketones, ur 5 (*)    Bacteria, UA RARE (*)    All other components within normal limits   EKG   RADIOLOGY  I personally viewed and evaluated these images as part of my medical decision making, as well as reviewing the written report by the radiologist.  ED Provider Interpretation: No acute findings of the lumbar spine or abdominal pelvic region  CT L-SPINE NO CHARGE Result Date: 01/15/2024 CLINICAL DATA:  Back pain EXAM: CT Lumbar Spine without contrast TECHNIQUE: Technique: Multiplanar CT images of the lumbar spine were reconstructed from contemporary CT of the Abdomen and Pelvis. RADIATION DOSE REDUCTION: This exam was performed according to the departmental dose-optimization program which includes automated exposure control, adjustment of the mA and/or kV according to patient size and/or use of iterative reconstruction technique. CONTRAST:   None or No additional COMPARISON:  Radiograph 08/26/2023, CT 07/09/2022 FINDINGS: Segmentation: 5 lumbar type vertebrae. Alignment: Dextroscoliosis at the thoracolumbar spine. About 8 mm left lateral listhesis at L2 on L3. Trace retrolisthesis L1 on L2. Vertebrae: No acute fracture or focal pathologic process. Paraspinal and other soft tissues: Small right-sided pleural effusion. Aortic atherosclerosis. Disc levels: Advanced disc space narrowing at L1-L2 with mild disc space narrowing at L2-L3, L3-L4 and L5-S1. Multilevel facet degenerative change, with moderate severe facet degeneration at L4-L5 and L5-S1. Mild diffuse disc bulge at L4-L5 with mild canal stenosis. Mild bilateral foraminal narrowing at L5 S1. IMPRESSION: 1. No CT evidence for acute osseous abnormality. 2. Dextroscoliosis at the thoracolumbar spine with multilevel degenerative changes. 3. Small right-sided pleural effusion. 4. Aortic Atherosclerosis (ICD10-I70.0). Aortic Atherosclerosis (ICD10-I70.0). Electronically Signed   By: Luke Bun M.D.   On: 01/15/2024 15:44   CT Renal Stone Study Result Date: 01/15/2024 CLINICAL DATA:  Abdominal and flank pain. EXAM: CT ABDOMEN AND PELVIS WITHOUT CONTRAST TECHNIQUE: Multidetector CT imaging of the abdomen and pelvis was performed following the standard protocol without IV contrast. RADIATION DOSE REDUCTION: This exam was performed according to the departmental dose-optimization program which includes automated exposure control, adjustment of the mA and/or kV according to patient size and/or use of iterative reconstruction technique. COMPARISON:  CT abdomen and pelvis 07/09/2022 FINDINGS: Lower chest: There are trace bilateral pleural effusions Hepatobiliary: No focal liver abnormality is seen. Status post cholecystectomy. No biliary dilatation. Pancreas: Unremarkable. No pancreatic ductal dilatation or surrounding inflammatory changes. Spleen: Normal in size without focal abnormality. Adrenals/Urinary  Tract: Low-attenuation left adrenal nodule compatible with adenoma measuring 18 mm appears unchanged from prior. The right adrenal gland, kidneys and bladder are within normal limits. Stomach/Bowel: Stomach is within normal limits. No evidence of bowel wall thickening, distention, or inflammatory changes. The appendix is not definitely visualized. There are scattered colonic diverticula. Vascular/Lymphatic: Aortic atherosclerosis. No enlarged abdominal or pelvic lymph nodes. Reproductive: Status post hysterectomy. No adnexal masses. Other: No abdominal wall hernia or abnormality. No abdominopelvic ascites. Musculoskeletal: Degenerative changes affect the spine. IMPRESSION: 1. No acute localizing process in the abdomen or pelvis. 2. Trace bilateral pleural effusions. 3. Colonic diverticulosis without evidence for diverticulitis. 4. Stable left adrenal adenoma. 5. Aortic atherosclerosis. Aortic Atherosclerosis (ICD10-I70.0).  A Electronically Signed   By: Greig Pique M.D.   On: 01/15/2024 15:39     PROCEDURES:  Critical Care performed: No  Procedures   MEDICATIONS ORDERED IN ED: Medications  lidocaine  (LIDODERM ) 5 % 1 patch (1 patch Transdermal Patch Applied 01/15/24 1513)  HYDROcodone -acetaminophen  (NORCO/VICODIN) 5-325 MG  per tablet 1 tablet (1 tablet Oral Given 01/15/24 1206)  ondansetron  (ZOFRAN -ODT) disintegrating tablet 4 mg (4 mg Oral Given 01/15/24 1511)  oxyCODONE  (Oxy IR/ROXICODONE ) immediate release tablet 5 mg (5 mg Oral Given 01/15/24 1511)     IMPRESSION / MDM / ASSESSMENT AND PLAN / ED COURSE  I reviewed the triage vital signs and the nursing notes.                              Differential diagnosis includes, but is not limited to, ovarian cyst, ovarian torsion, acute appendicitis, diverticulitis, urinary tract infection/pyelonephritis, bowel obstruction, colitis, renal colic, gastroenteritis, hernia, DDD, sciatica, lumbar radiculopathy, myalgia, etc.  Patient's presentation is  most consistent with acute complicated illness / injury requiring diagnostic workup.  Patient's diagnosis is consistent with acute right-sided low back pain with sciatica.  Patient presents with symptoms concerning for musculoskeletal etiology versus possible kidney stone.  Her exam and labs are overall reassuring.  UA without evidence of hematuria or pyuria.  CT of the lumbar spine, reviewed interpreted by me, shows no acute bony changes.  Patient did have significant DDD as well as some foraminal stenosis around L5-S1.  This likely represents some sciatic nerve irritation, consistent with the patient's presentation.  CT abdomen pelvis interpreted by me, shows no other acute intrathoracic process.  No red flags on exam.  Patient reports significant treatment of her pain after medication ministration in the ED.  Patient will be discharged home with prescriptions for prednisone , Lidoderm  patches, tizanidine , and hydrocodone  (#9). Patient is to follow up with her PCP as suggested, as needed or otherwise directed. Patient is given ED precautions to return to the ED for any worsening or new symptoms.  Clinical Course as of 01/15/24 1841  Fri Jan 15, 2024  1523 Patient updated on the delay in imaging report.  She stable at this time.  Pain medicine has been provided. [JM]    Clinical Course User Index [JM] Angelice Piech, Candida LULLA Kings, PA-C    FINAL CLINICAL IMPRESSION(S) / ED DIAGNOSES   Final diagnoses:  Acute right-sided low back pain with right-sided sciatica     Rx / DC Orders   ED Discharge Orders          Ordered    lidocaine  (LIDODERM ) 5 %  Every 12 hours PRN        01/15/24 1616    HYDROcodone -acetaminophen  (NORCO/VICODIN) 5-325 MG tablet  3 times daily PRN        01/15/24 1616    tiZANidine  (ZANAFLEX ) 4 MG tablet  Every 8 hours PRN        01/15/24 1616    predniSONE  (DELTASONE ) 20 MG tablet  Daily with breakfast        01/15/24 1616             Note:  This document was  prepared using Dragon voice recognition software and may include unintentional dictation errors.    Loyd Candida LULLA Kings, PA-C 01/15/24 1844    Arlander Charleston, MD 01/23/24 [phone]

## 2024-01-15 NOTE — ED Triage Notes (Addendum)
 Pt states that she has been having back pain and lower abd pain, states that she was seeing blood in her stool last week, pt reports not being able to eat, pt does state that she has had a kidney stone in the past

## 2024-01-20 ENCOUNTER — Ambulatory Visit (INDEPENDENT_AMBULATORY_CARE_PROVIDER_SITE_OTHER): Admitting: Internal Medicine

## 2024-01-20 ENCOUNTER — Encounter: Payer: Self-pay | Admitting: Internal Medicine

## 2024-01-20 VITALS — BP 120/80 | HR 75 | Temp 98.5°F | Ht 65.0 in | Wt 194.8 lb

## 2024-01-20 DIAGNOSIS — G4733 Obstructive sleep apnea (adult) (pediatric): Secondary | ICD-10-CM | POA: Diagnosis not present

## 2024-01-20 MED ORDER — TRELEGY ELLIPTA 200-62.5-25 MCG/ACT IN AEPB: 1.0000 | INHALATION_SPRAY | Freq: Every day | RESPIRATORY_TRACT | Status: AC

## 2024-01-20 NOTE — Progress Notes (Signed)
 @Patient  ID: [name], female    DOB: 1961-01-03, 63 y.o.   MRN: [id number]   CC Follow-up assessment for severe persistent asthma Follow-up assessment for OSA   HPI: 63 year old female, warmer smoker quit in 2012.  Past medical history significant for hypertension, heart failure, asthma/COPD, OSA on CPAP, allergic rhinitis, eczema, fatty liver, GERD, hyperlipidemia, type 2 diabetes, Vit D deficiency.HST 04/25/21 showed mild sleep apena, AHI 13.1/hour with SpO2 low 85%   Follow-up assessment for severe persistent asthma Trelegy therapy at 200 Trelegy is working to keep her symptoms controlled No exacerbation at this time No evidence of heart failure at this time No evidence or signs of infection at this time No respiratory distress No fevers, chills, nausea, vomiting, diarrhea No evidence of lower extremity edema No evidence hemoptysis\ IgE levels in November 2024 were elevated at 424  At this time patient is refusing biological agents with Xolair  therapy Patient is to follow-up with allergy immunology  Assessment of OSA Patient is intolerant of CPAP mask Plan to restart CPAP therapy Rereferral to DME company Start auto CPAP 5-15 Mask of choice       Allergies  Allergen Reactions   Citrullus Vulgaris Other (See Comments)    Throat itchy, watermelons   Latex Rash        Shellfish Allergy Anaphylaxis    Hard shellfish/swelling in throat   Sulfa Antibiotics Anaphylaxis    Swelling in throat.   Sulfonamide Derivatives Anaphylaxis    Swelling in throat.   Misc. Sulfonamide Containing Compounds    Penicillins Itching   Soy Allergy (Obsolete)     Diarrhea/upset stomach   Tomato Itching    Throat itchy - also Raw carrots   Other Swelling    Immunization History  Administered Date(s) Administered   Influenza Split 05/22/2015, 04/21/2016   Influenza, Quadrivalent, Recombinant, Inj, Pf 05/27/2022   Influenza,inj,Quad PF,6+ Mos 04/05/2018, 03/29/2019,  04/19/2020, 04/19/2021   Influenza-Unspecified 05/20/2017, 05/27/2022, 06/08/2023   PFIZER(Purple Top)SARS-COV-2 Vaccination 01/03/2020, 01/24/2020   PNEUMOCOCCAL CONJUGATE-20 06/08/2023   RSV,unspecified 05/27/2022   Respiratory Syncytial Virus Vaccine,Recomb Aduvanted(Arexvy) 05/27/2022   Tdap 05/05/2019    Past Medical History:  Diagnosis Date   Abnormal feces    Abnormality of both breasts on screening mammogram 05/14/2020   Acute esophagogastric ulcer    Allergy    Anemia    Annual physical exam 01/14/2021   Anxiety and depression 04/05/2018   Arthritis    Asthma    Atypical chest pain 01/20/2014   Back ache 06/19/2015   Bacterial vaginitis    Benign breast cyst in female, right 02/22/2020   Benign neoplasm of ascending colon    Bilateral hip pain 04/19/2020   Blood in stool    Brachial neuritis or radiculitis NOS    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 being followed by neurosurgery/eye doctor and pcp. monitoring size.causes sinus problems   Breast mass, right 01/14/2021   Bronchitis    Bunion of right foot 08/26/2018   Cardiac arrhythmia    CCF (congestive cardiac failure) (HCC) 06/19/2015   Cervical neck pain with evidence of disc disease    C5/6 disease, MRI done late 2012 - no records available   Chronic combined systolic and diastolic CHF (congestive heart failure) (HCC)    a. 08/2010 Echo: mildly reduced EF 40-45%, mild diffuse hypokinesis; b. 06/2016 Echo: EF 50%, no rwma, mild to mod TR; c. 03/2017 Echo: EF 40-45%, Gr1 DD (prior echo reviewed and EF felt to  be lower than reported).   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 12/2018 PFT: No obvious  obst/restrictive dzs.   COPD exacerbation (HCC) 03/12/2022   Coronary artery disease    a. PCI of LCX 2003; b. PCI of the LAD 2012 with a (2.5 x 8 mm BMS);  c.s/p CABG 4/12: L-LAD, VG-Dx, VG-OM, VG-RCA (Dr. Fleeta Ochoa);  d. 01/2012 MV: inf infarct, attenuation, no ischemia; e. 02/2017 MV: signifi attenuation artifact. Fixed basal antlat/inflat scar vs artifact. Reversible apical lat and mid antlat defect - ? atten vs ischemia. F/u echo w/o wma->Med rx.   Coronary artery disease involving native coronary artery of native heart without angina pectoris 08/23/2010   Depression    Depression    Diabetes mellitus without complication [facility])    Fall 05/14/2018   Family history of colon cancer    Family history of colon cancer 06/19/2015   Gastritis without bleeding    Generalized headaches    frequent   GERD (gastroesophageal reflux disease)    Headache    Heart murmur    Hematochezia    Hematochezia 01/11/2015   Hepatitis    history of hepatitis b   History of cervical cancer    s/p cryotherapy   History of cervical cancer    s/p cryotherapy   History of colonic polyps 01/11/2015   History of drug abuse (HCC)    cocaine, marijuana, clean since 1989   History of drug abuse (HCC)    cocaine, marijuana, clean since 1989   History of hepatitis B    from eating undercooked liver   History of MI (myocardial infarction)    HTN (hypertension) 12/20/2011   Hyperlipidemia    Hypertension    Insomnia 04/19/2020   Intractable vomiting with nausea    Intractable vomiting with nausea    Ischemic cardiomyopathy    a. 03/2017 Echo: EF 40-45%, Gr1 DD.   Lipoma of right lower extremity 08/30/2020   Mild intermittent asthma 10/13/2019   Myocardial infarction [facility]) 2003, 2012   Need for pneumococcal 20-valent conjugate vaccination 07/06/2022   Numbness and tingling in right hand 02/24/2018   Orthostatic hypotension 04/30/2015   PAD (peripheral artery disease) (HCC)    a. s/p Right SFA atherectomy  and PTA 01/15/11; b. 07/2018 ABI: R 1.02, L 1.09.   Pituitary mass (HCC)    a. 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 Mass abuts R cavernous sinus w/o definite invasion.   PND (post-nasal drip) 07/24/2021   Polyp of colon    Pruritus of both eyes 09/21/2020   Right knee pain 12/20/2011   Abnormal MRI right knee 08/29/2016 f/u [facility]      FINDINGS: Medial compartment: [There is abnormal linear signal within the medial meniscus near junction of posterior horn and mid body which abuts the tibial undersurface consistent with nondisplaced tear (5:8-10). The articular cartilage, and subchondral bone are normal. 3 mm bone island (7:14).  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   Seasonal allergies    Shortness of breath 08/23/2010  Qualifier: Diagnosis of   By: Claudene RN, Megan       Skin lesion 04/19/2021   Sleep apnea    uses cpap   Smoking history    quit 07/2010   Smoking history 12/16/2010   Quit 07/2010   Status post total knee replacement using cement, right 04/14/2019   Thrush 04/19/2021   Thyroid  disease    Urinary incontinence    Urinary incontinence 12/18/2017   Vaginal itching 04/19/2021   Vasomotor flushing 11/24/2016    Tobacco History: Social History   Tobacco Use  Smoking Status Former   Current packs/day: 0.00   Average packs/day: 2.0 packs/day for 38.0 years (76.0 ttl pk-yrs)   Types: Cigarettes   Start date: 08/12/1972   Quit date: 08/12/2010   Years since quitting: 13.4  Smokeless Tobacco Never   Counseling given: Not Answered   Outpatient Medications Prior to Visit  Medication Sig Dispense Refill   acetaminophen  (TYLENOL  8 HOUR) 650 MG CR tablet Take 2 tablets (1,300 mg total) by mouth 2 (two) times daily as needed for pain. 60 tablet 1   albuterol  (VENTOLIN  HFA) 108 (90 Base) MCG/ACT inhaler Inhale 2 puffs into the lungs every 6 (six) hours as needed for wheezing or shortness of breath. 1 g 12    Alirocumab  (PRALUENT ) 75 MG/ML SOAJ Inject 1 mL (75 mg total) into the skin every 14 (fourteen) days. 2 mL 12   aspirin  EC 81 MG tablet Take 1 tablet (81 mg total) by mouth daily. 90 tablet 3   Azelastine  HCl 0.15 % SOLN Place 2 sprays into both nostrils daily as needed (allergies). 30 mL 11   clobetasol  cream (TEMOVATE ) 0.05 % Apply 1 Application topically 2 (two) times daily.     clopidogrel  (PLAVIX ) 75 MG tablet Take 75 mg by mouth daily.     clotrimazole  (MYCELEX ) 10 MG troche Take 1 tablet (10 mg total) by mouth in the morning and at bedtime. 14 tablet 0   dicyclomine  (BENTYL ) 10 MG capsule Take 1 capsule (10 mg total) by mouth 3 (three) times daily before meals. As needed for abdominal pain 120 capsule 11   EPINEPHrine  0.3 mg/0.3 mL IJ SOAJ injection Inject one pen into the thigh as needed for anaphylactic reaction. Then CALL 911. Use second pen if needed thereafter 2 each 5   esomeprazole  (NEXIUM ) 40 MG capsule Take 1 capsule by mouth once daily 90 capsule 3   ezetimibe  (ZETIA ) 10 MG tablet Take 1 tablet (10 mg total) by mouth daily. 90 tablet 3   FARXIGA  10 MG TABS tablet TAKE 1 TABLET BY MOUTH ONCE DAILY BEFORE BREAKFAST 30 tablet 11   fluticasone  (FLONASE ) 50 MCG/ACT nasal spray Place 2 sprays into both nostrils daily. Prn nasal congestion 16 g 11   Fluticasone -Umeclidin-Vilant (TRELEGY ELLIPTA ) 200-62.5-25 MCG/ACT AEPB Inhale 1 Act into the lungs daily. 1 each 10   furosemide  (LASIX ) 20 MG tablet Take 1 tablet (20 mg total) by mouth daily AND 1 tablet (20 mg total) 3 (three) times a week at lunch. 126 tablet 0   GEMTESA  75 MG TABS Take 1 tablet (75 mg total) by mouth daily. 90 tablet 3   hydrocortisone  butyrate (LUCOID) 0.1 % CREA cream Apply topically 2 (two) times daily.     hydrocortisone  cream 1 % Apply topically 2 (two) times daily. 30 g 0   hydrOXYzine  (ATARAX ) 25 MG tablet Take 1 tablet (25 mg total) by mouth daily as needed for anxiety. 60 tablet 1  ipratropium (ATROVENT ) 0.06 %  nasal spray Place 2 sprays into both nostrils 4 (four) times daily. 15 mL 12   ipratropium-albuterol  (DUONEB) 0.5-2.5 (3) MG/3ML SOLN USE 1 VIAL IN NEBULIZER TWICE DAILY AS NEEDED FOR WHEEZING OR SHORTNESS OF BREATH 360 mL 0   isosorbide  mononitrate (IMDUR ) 30 MG 24 hr tablet Take 1 tablet (30 mg total) by mouth 2 (two) times daily. 180 tablet 3   levocetirizine (XYZAL ) 5 MG tablet Take 1 tablet (5 mg total) by mouth at bedtime as needed for allergies. 90 tablet 3   lidocaine  (LIDODERM ) 5 % Place 1 patch onto the skin every 12 (twelve) hours as needed for up to 10 days. Remove & Discard patch after 12 hours of wear each day. 10 patch 0   linaclotide  (LINZESS ) 145 MCG CAPS capsule TAKE 1 CAPSULE BY MOUTH BEFORE BREAKFAST 90 capsule 3   metoprolol  succinate (TOPROL -XL) 50 MG 24 hr tablet Take with or immediately following a meal. 90 tablet 3   montelukast  (SINGULAIR ) 10 MG tablet Take 1 tablet (10 mg total) by mouth at bedtime. 90 tablet 3   nitroGLYCERIN  (NITROSTAT ) 0.4 MG SL tablet DISSOLVE ONE TABLET UNDER THE TONGUE EVERY 5 MINUTES AS NEEDED FOR CHEST PAIN.  DO NOT EXCEED A TOTAL OF 3 DOSES IN 15 MINUTES 25 tablet 3   nystatin  (MYCOSTATIN ) 100000 UNIT/ML suspension TAKE 5MLS BY MOUTH 4 TIMES DAILY 240 mL 0   olopatadine  (PATANOL) 0.1 % ophthalmic solution INSTILL 1 DROP INTO EACH EYE TWICE DAILY 10 mL 1   omalizumab  (XOLAIR ) 300 MG/2ML injection Inject 300mg  into the skin every 14 days (total dose 375mg  every 14 days). Courier to pulm: [address], [address], [place] [address]. Appt on 07/01/23 4 mL 0   omalizumab  (XOLAIR ) 75 MG/0.5ML pen Inject 75mg  into the skin every 14 days (total dose 375mg  every 14 days). Courier to pulm: [address], [address], [place] [address]. Appt on 07/01/23 1 mL 0   OneTouch Delica Lancets 33G MISC USE TO CHECK GLUCOSE IN THE MORNING AND AT BEDTIME 200 each 0   ONETOUCH VERIO test strip USE 1 STRIP TO CHECK GLUCOSE TWICE DAILY ONCE  IN  THE  MORNING  AND   ONCE  AT  BEDTIME 200 each 0   PARoxetine  (PAXIL ) 20 MG tablet Take 1 tablet (20 mg total) by mouth daily. 90 tablet 3   potassium chloride  (KLOR-CON  M) 10 MEQ tablet Take 10 mEq by mouth daily.     predniSONE  (DELTASONE ) 20 MG tablet Take 2 tablets (40 mg total) by mouth daily with breakfast for 5 days. 10 tablet 0   pregabalin  (LYRICA ) 75 MG capsule Take 1 capsule by mouth twice daily 60 capsule 0   rivaroxaban  (XARELTO ) 2.5 MG TABS tablet Take 1 tablet (2.5 mg total) by mouth 2 (two) times daily. 180 tablet 1   rosuvastatin  (CRESTOR ) 20 MG tablet TAKE 1 TABLET BY MOUTH AT BEDTIME 90 tablet 3   sodium chloride  (OCEAN) 0.65 % SOLN nasal spray Place 2 sprays into both nostrils daily as needed for congestion. 30 mL 11   Spacer/Aero-Holding Chambers (AEROCHAMBER MV) inhaler Use as instructed 1 each 0   tiZANidine  (ZANAFLEX ) 4 MG tablet Take 1 tablet (4 mg total) by mouth every 8 (eight) hours as needed for muscle spasms. 30 tablet 0   glipiZIDE  (GLUCOTROL ) 5 MG tablet Take 1 tablet (5 mg total) by mouth daily for 5 days. While on steroids if blood glucose >  180 5 tablet 0   No facility-administered medications prior to visit.   BP 120/80 (BP Location: Right Arm, Patient Position: Sitting, Cuff Size: Large)   Pulse 75   Temp 98.5 F (36.9 C) (Oral)   Ht 5' 5 (1.651 m)   Wt 194 lb 12.8 oz (88.4 kg)   SpO2 99%   BMI 32.42 kg/m      Review of Systems: Gen:  Denies  fever, sweats, chills weight loss  HEENT: Denies blurred vision, double vision, ear pain, eye pain, hearing loss, nose bleeds, sore throat Cardiac:  No dizziness, chest pain or heaviness, chest tightness,edema, No JVD Resp:   No cough, -sputum production, -shortness of breath,-wheezing, -hemoptysis,  Other:  All other systems negative   Physical Examination:   General Appearance: No distress  EYES PERRLA, EOM intact.   NECK Supple, No JVD Pulmonary: normal breath sounds, No wheezing.  CardiovascularNormal S1,S2.  No  m/r/g.   Abdomen: Benign, Soft, non-tender. Neurology UE/LE 5/5 strength, no focal deficits Ext pulses intact, cap refill intact ALL OTHER ROS ARE NEGATIVE    Lab Results:  CBC    Component Value Date/Time   WBC 8.2 01/15/2024 1058   RBC 4.78 01/15/2024 1058   HGB 15.1 (H) 01/15/2024 1058   HGB 12.8 02/13/2012 2319   HCT 46.0 01/15/2024 1058   HCT 38.3 02/13/2012 2319   PLT 171 01/15/2024 1058   PLT 144 (L) 02/13/2012 2319   MCV 96.2 01/15/2024 1058   MCV 94 02/13/2012 2319   MCH 31.6 01/15/2024 1058   MCHC 32.8 01/15/2024 1058   RDW 14.2 01/15/2024 1058   RDW 13.9 02/13/2012 2319   LYMPHSABS 1.8 01/15/2024 1058   MONOABS 0.5 01/15/2024 1058   EOSABS 0.1 01/15/2024 1058   BASOSABS 0.0 01/15/2024 1058    BMET    Component Value Date/Time   NA 138 01/15/2024 1058   NA 142 01/20/2014 1127   NA 141 02/13/2012 2319   K 3.7 01/15/2024 1058   K 3.6 02/13/2012 2319   CL 104 01/15/2024 1058   CL 106 02/13/2012 2319   CO2 21 (L) 01/15/2024 1058   CO2 27 02/13/2012 2319   GLUCOSE 100 (H) 01/15/2024 1058   GLUCOSE 92 02/13/2012 2319   BUN 13 01/15/2024 1058   BUN 16 01/20/2014 1127   BUN 16 02/13/2012 2319   CREATININE 1.03 (H) 01/15/2024 1058   CREATININE 1.01 (H) 08/21/2020 1014   CALCIUM  9.4 01/15/2024 1058   CALCIUM  8.7 02/13/2012 2319   GFRNONAA >60 01/15/2024 1058   GFRNONAA >60 02/13/2012 2319   GFRAA >60 08/25/2019 1018   GFRAA >60 02/13/2012 2319    BNP    Component Value Date/Time   BNP 1,292 (H) 02/13/2012 2319    ProBNP No results found for: PROBNP  Imaging: CT L-SPINE NO CHARGE Result Date: 01/15/2024 CLINICAL DATA:  Back pain EXAM: CT Lumbar Spine without contrast TECHNIQUE: Technique: Multiplanar CT images of the lumbar spine were reconstructed from contemporary CT of the Abdomen and Pelvis. RADIATION DOSE REDUCTION: This exam was performed according to the departmental dose-optimization program which includes automated exposure control,  adjustment of the mA and/or kV according to patient size and/or use of iterative reconstruction technique. CONTRAST:  None or No additional COMPARISON:  Radiograph 08/26/2023, CT 07/09/2022 FINDINGS: Segmentation: 5 lumbar type vertebrae. Alignment: Dextroscoliosis at the thoracolumbar spine. About 8 mm left lateral listhesis at L2 on L3. Trace retrolisthesis L1 on L2. Vertebrae: No acute fracture or focal pathologic  process. Paraspinal and other soft tissues: Small right-sided pleural effusion. Aortic atherosclerosis. Disc levels: Advanced disc space narrowing at L1-L2 with mild disc space narrowing at L2-L3, L3-L4 and L5-S1. Multilevel facet degenerative change, with moderate severe facet degeneration at L4-L5 and L5-S1. Mild diffuse disc bulge at L4-L5 with mild canal stenosis. Mild bilateral foraminal narrowing at L5 S1. IMPRESSION: 1. No CT evidence for acute osseous abnormality. 2. Dextroscoliosis at the thoracolumbar spine with multilevel degenerative changes. 3. Small right-sided pleural effusion. 4. Aortic Atherosclerosis (ICD10-I70.0). Aortic Atherosclerosis (ICD10-I70.0). Electronically Signed   By: Luke Bun M.D.   On: 01/15/2024 15:44   CT Renal Stone Study Result Date: 01/15/2024 CLINICAL DATA:  Abdominal and flank pain. EXAM: CT ABDOMEN AND PELVIS WITHOUT CONTRAST TECHNIQUE: Multidetector CT imaging of the abdomen and pelvis was performed following the standard protocol without IV contrast. RADIATION DOSE REDUCTION: This exam was performed according to the departmental dose-optimization program which includes automated exposure control, adjustment of the mA and/or kV according to patient size and/or use of iterative reconstruction technique. COMPARISON:  CT abdomen and pelvis 07/09/2022 FINDINGS: Lower chest: There are trace bilateral pleural effusions Hepatobiliary: No focal liver abnormality is seen. Status post cholecystectomy. No biliary dilatation. Pancreas: Unremarkable. No pancreatic  ductal dilatation or surrounding inflammatory changes. Spleen: Normal in size without focal abnormality. Adrenals/Urinary Tract: Low-attenuation left adrenal nodule compatible with adenoma measuring 18 mm appears unchanged from prior. The right adrenal gland, kidneys and bladder are within normal limits. Stomach/Bowel: Stomach is within normal limits. No evidence of bowel wall thickening, distention, or inflammatory changes. The appendix is not definitely visualized. There are scattered colonic diverticula. Vascular/Lymphatic: Aortic atherosclerosis. No enlarged abdominal or pelvic lymph nodes. Reproductive: Status post hysterectomy. No adnexal masses. Other: No abdominal wall hernia or abnormality. No abdominopelvic ascites. Musculoskeletal: Degenerative changes affect the spine. IMPRESSION: 1. No acute localizing process in the abdomen or pelvis. 2. Trace bilateral pleural effusions. 3. Colonic diverticulosis without evidence for diverticulitis. 4. Stable left adrenal adenoma. 5. Aortic atherosclerosis. Aortic Atherosclerosis (ICD10-I70.0).  A Electronically Signed   By: Greig Pique M.D.   On: 01/15/2024 15:39   DG Chest 2 View Result Date: 12/31/2023 CLINICAL DATA:  Persistent cough >2 weeks EXAM: CHEST - 2 VIEW COMPARISON:  March 11, 2022 FINDINGS: No focal airspace consolidation, pleural effusion, or pneumothorax. No cardiomegaly. Sternotomy wires and CABG markers. No acute fracture or destructive lesion. Multilevel degenerative disc disease of the spine. Cholecystectomy clips. IMPRESSION: No acute cardiopulmonary abnormality. Electronically Signed   By: Rogelia Myers M.D.   On: 12/31/2023 10:59     Assessment & Plan:   63 year old African-American female seen today for follow-up assessment for asthma based on her symptoms with severely persistent has refused biological agents at this time has been assessed for immunological therapy Xolair  in the setting of obstructive sleep apnea mild AHI  11  Assessment of severe persistent asthma Patient refuses Xolair  therapy at this time Currently on Trelegy 200 Rinse mouth after use Avoid Allergens and Irritants Avoid secondhand smoke Avoid SICK contacts Recommend  Masking  when appropriate Recommend Keep up-to-date with vaccinations Follow-up allergy immunology   Allergic rhinitis Continue antihistamines Continue nasal sprays   OSA Mild OSA Patient intolerant of CPAP and mask Recommend weight loss Reestablish treatment for CPAP Referral to DME company for new machine and supplies Start auto CPAP 5-15   Obesity -recommend significant weight loss -recommend changing diet  Deconditioned state -Recommend increased daily activity and exercise  Costochondritis and muscle pain Tylenol  as needed   MEDICATION ADJUSTMENTS/LABS AND TESTS ORDERED: Trelegy to 200 once a day Please rinse mouth out after use Start auto CPAP 5-15 Mask of choice Continue albuterol  as needed Follow-up with allergy immunology Avoid Allergens and Irritants Avoid secondhand smoke Avoid SICK contacts Recommend  Masking  when appropriate Recommend Keep up-to-date with vaccinations Recommend weight loss   CURRENT MEDICATIONS REVIEWED AT LENGTH WITH PATIENT TODAY   Patient  satisfied with Plan of action and management. All questions answered   Follow up 6 months   I spent a total of 44 minutes reviewing chart data, face-to-face evaluation with the patient, counseling and coordination of care as detailed above.      Nickolas Alm Cellar, M.D.  [facility]  Medical Director [facility] Medical Director [facility] Cardio-Pulmonary Department

## 2024-01-20 NOTE — Patient Instructions (Signed)
 Trelegy to 200 once a day Please rinse mouth out after use Start auto CPAP 5-15 Mask of choice Continue albuterol  as needed Follow-up with allergy immunology Avoid Allergens and Irritants Avoid secondhand smoke Avoid SICK contacts Recommend  Masking  when appropriate Recommend Keep up-to-date with vaccinations Recommend weight loss

## 2024-01-21 NOTE — Addendum Note (Signed)
 Addended by: Tahir Blank on: 01/21/2024 08:08 AM   Modules accepted: Orders

## 2024-01-25 ENCOUNTER — Other Ambulatory Visit: Payer: Self-pay | Admitting: Family Medicine

## 2024-01-25 DIAGNOSIS — G629 Polyneuropathy, unspecified: Secondary | ICD-10-CM

## 2024-01-27 ENCOUNTER — Other Ambulatory Visit: Payer: Self-pay | Admitting: Cardiovascular Disease

## 2024-02-19 ENCOUNTER — Other Ambulatory Visit: Payer: Self-pay

## 2024-02-19 DIAGNOSIS — E1169 Type 2 diabetes mellitus with other specified complication: Secondary | ICD-10-CM

## 2024-02-19 MED ORDER — EZETIMIBE 10 MG PO TABS: 10.0000 mg | ORAL_TABLET | Freq: Every day | ORAL | 3 refills | Status: DC

## 2024-03-04 NOTE — Progress Notes (Deleted)
 Cardiology Office Note  Date:  03/04/2024   ID:  [name], DOB 02-04-1961, MRN [id number]  PCP:  Abbey Bruckner, MD   No chief complaint on file.  HPI:  63 yo woman with a  long smoking history,   CAD s/p  NSTEMI at [facility]  1/12, with BMS to pLAD (80% stenosis),  residual severe stenosis of the RCA/nondominant small vessel,  continued chest pain,  cardiac catheterization showing distal left main stenosis involving a high grade ostial LAD stenosis and circumflex disease approximately 90%, 90% diagonal disease, 80% nondominant RCA disease with ejection fraction 45%,  CABG x 4 in 2012 with a LIMA to the LAD, vein graft to the diagonal, vein graft to the marginal and vein graft to the RCA,   peripheral vascular disease,  S/p atherectomy and PTA of her right SFA on 01/15/11,   outpatient stress test  July 2013 , OSA who presents for routine followup of her coronary artery disease  LOV 6/24  Reports feeling relatively well Taking lasix  20 BID Medicaid not covering potassium, has been taking over-the-counter potassium Sent in wrong prescription, sent in is a packet rather than a tablet  Denies significant leg swelling, weight stable No significant chest pain concerning for angina Weight stable No regular exercise program  Long history of chronic neck pain, shoulder pain, numbness into her hands  Lab Work reviewed Total cholesterol 149 LDL 76, trending higher over the past 3-4 years A1c 6.4  Echocardiogram September 2023 EF 45 to 50%, stable  EKG personally reviewed by myself on todays visit       Prior  outpatient stress test  July 2013 showed significant breast attenuation artifact, inferior wall artifact from GI uptake. Overall no significant ischemia. History of chronic left arm and upper left chest  pain and was seen pain clinic. Prior diagnosis of carpal tunnel syndrome  occasional left breast pain radiating to her mediastinum.    Previously seen by ear nose throat for  vestibular testing as a cause of her dizziness.  EGD and colonoscopy which by her report showed hiatal hernia. Prior problems with swallowing  She has had MRI of the neck showing cervical disc disease   Echocardiogram in the hospital showed normal LV systolic function ejection fraction 50-55%, mild MR, mild to moderate TR with right ventricular systolic pressures 30-40  PMH:   has a past medical history of Abnormal feces, Abnormality of both breasts on screening mammogram (05/14/2020), Acute esophagogastric ulcer, Allergy, Anemia, Annual physical exam (01/14/2021), Anxiety and depression (04/05/2018), Arthritis, Asthma, Atypical chest pain (01/20/2014), Back ache (06/19/2015), Bacterial vaginitis, Benign breast cyst in female, right (02/22/2020), Benign neoplasm of ascending colon, Bilateral hip pain (04/19/2020), Blood in stool, Brachial neuritis or radiculitis NOS, Brain tumor (benign) (HCC) (07/2017), Breast mass, right (01/14/2021), Bronchitis, Bunion of right foot (08/26/2018), Cardiac arrhythmia, CCF (congestive cardiac failure) (HCC) (06/19/2015), Cervical neck pain with evidence of disc disease, Chronic combined systolic and diastolic CHF (congestive heart failure) (HCC), Chronic neck pain (12/18/2017), Chronic pain, Chronic pain of inguinal region (07/24/2021), CLAUDICATION (08/23/2010), Cocaine abuse, in remission [facility]), Colon polyp (06/19/2015), Colon polyps, COPD (chronic obstructive pulmonary disease) (HCC), COPD exacerbation (HCC) (03/12/2022), Coronary artery disease, Coronary artery disease involving native coronary artery of native heart without angina pectoris (08/23/2010), Depression, Depression, Diabetes mellitus without complication (HCC), Fall (05/14/2018), Family history of colon cancer, Family history of colon cancer (06/19/2015), Gastritis without bleeding, Generalized headaches, GERD (gastroesophageal reflux disease), Headache, Heart murmur, Hematochezia, Hematochezia (01/11/2015),  Hepatitis, History of  cervical cancer, History of cervical cancer, History of colonic polyps (01/11/2015), History of drug abuse (HCC), History of drug abuse (HCC), History of hepatitis B, History of MI (myocardial infarction), HTN (hypertension) (12/20/2011), Hyperlipidemia, Hypertension, Insomnia (04/19/2020), Intractable vomiting with nausea, Intractable vomiting with nausea, Ischemic cardiomyopathy, Lipoma of right lower extremity (08/30/2020), Mild intermittent asthma (10/13/2019), Myocardial infarction (HCC) (2003, 2012), Need for pneumococcal 20-valent conjugate vaccination (07/06/2022), Numbness and tingling in right hand (02/24/2018), Orthostatic hypotension (04/30/2015), PAD (peripheral artery disease) (HCC), Pituitary mass (HCC), PND (post-nasal drip) (07/24/2021), Polyp of colon, Pruritus of both eyes (09/21/2020), Right knee pain (12/20/2011), 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  PSH:    Past Surgical History:  Procedure Laterality Date   ABDOMINAL HYSTERECTOMY     2003   CHOLECYSTECTOMY  1986   COLONOSCOPY  2008   3 polyps   COLONOSCOPY WITH PROPOFOL  N/A 02/06/2015   Procedure: COLONOSCOPY WITH PROPOFOL ;  Surgeon: Rogelia Copping, MD;  Location: [facility] ENDOSCOPY;  Service: Endoscopy;  Laterality: N/A;   COLONOSCOPY WITH PROPOFOL  N/A 01/27/2017   Procedure: COLONOSCOPY WITH PROPOFOL ;  Surgeon: Copping Rogelia, MD;  Location: [facility] ENDOSCOPY;  Service: Endoscopy;  Laterality: N/A;   COLONOSCOPY WITH PROPOFOL  N/A 07/15/2022   Procedure: COLONOSCOPY WITH PROPOFOL ;  Surgeon: Unk Corinn Skiff, MD;  Location: [facility] ENDOSCOPY;  Service:  Gastroenterology;  Laterality: N/A;   CORONARY ANGIOPLASTY     w/ stent placement x2   CORONARY ARTERY BYPASS GRAFT  2012   Dr Perla   CORONARY STENT PLACEMENT  2003   S/P MI   CORONARY STENT PLACEMENT  2007   [place]   ESOPHAGOGASTRODUODENOSCOPY (EGD) WITH PROPOFOL  N/A 02/06/2015   Procedure: ESOPHAGOGASTRODUODENOSCOPY (EGD) WITH PROPOFOL ;  Surgeon: Rogelia Copping, MD;  Location: [facility] ENDOSCOPY;  Service: Endoscopy;  Laterality: N/A;   ESOPHAGOGASTRODUODENOSCOPY (EGD) WITH PROPOFOL  N/A 01/27/2017   Procedure: ESOPHAGOGASTRODUODENOSCOPY (EGD) WITH PROPOFOL ;  Surgeon: Copping Rogelia, MD;  Location: [facility] ENDOSCOPY;  Service: Endoscopy;  Laterality: N/A;   ESOPHAGOGASTRODUODENOSCOPY (EGD) WITH PROPOFOL  N/A 09/01/2019   Procedure: ESOPHAGOGASTRODUODENOSCOPY (EGD) WITH PROPOFOL ;  Surgeon: Unk Corinn Skiff, MD;  Location: [facility] ENDOSCOPY;  Service: Gastroenterology;  Laterality: N/A;   FEMORAL ARTERY STENT  10/2010   right sided (Dr. Wonda)   KNEE ARTHROSCOPY WITH MEDIAL MENISECTOMY Right 08/20/2017   Procedure: KNEE ARTHROSCOPY WITH MEDIAL  AND LATERAL MENISECTOMY;  Surgeon: Kathlynn Sharper, MD;  Location: [facility] ORS;  Service: Orthopedics;  Laterality: Right;   POSTERIOR CERVICAL LAMINECTOMY N/A 03/08/2018   Procedure: POSTERIOR CERVICAL LAMINECTOMY-C7;  Surgeon: Bluford Standing, MD;  Location: [facility] ORS;  Service: Neurosurgery;  Laterality: N/A;   TONSILLECTOMY     TOTAL KNEE ARTHROPLASTY Right 04/14/2019   Procedure: RIGHT TOTAL KNEE ARTHROPLASTY;  Surgeon: Kathlynn Sharper, MD;  Location: [facility] ORS;  Service: Orthopedics;  Laterality: Right;   TUBAL LIGATION      Current Outpatient Medications  Medication Sig Dispense Refill   acetaminophen  (TYLENOL  8 HOUR) 650 MG CR tablet Take 2 tablets (1,300 mg total) by mouth 2 (two) times daily as needed for pain. 60 tablet 1   albuterol  (VENTOLIN  HFA) 108 (90 Base) MCG/ACT inhaler Inhale 2 puffs into the lungs every 6 (six) hours as needed for wheezing or shortness of  breath. 1 g 12   Alirocumab  (PRALUENT ) 75 MG/ML  SOAJ Inject 1 mL (75 mg total) into the skin every 14 (fourteen) days. 2 mL 12   aspirin  EC 81 MG tablet Take 1 tablet (81 mg total) by mouth daily. 90 tablet 3   Azelastine  HCl 0.15 % SOLN Place 2 sprays into both nostrils daily as needed (allergies). 30 mL 11   clobetasol  cream (TEMOVATE ) 0.05 % Apply 1 Application topically 2 (two) times daily.     clopidogrel  (PLAVIX ) 75 MG tablet Take 75 mg by mouth daily.     clotrimazole  (MYCELEX ) 10 MG troche Take 1 tablet (10 mg total) by mouth in the morning and at bedtime. 14 tablet 0   dicyclomine  (BENTYL ) 10 MG capsule Take 1 capsule (10 mg total) by mouth 3 (three) times daily before meals. As needed for abdominal pain 120 capsule 11   EPINEPHrine  0.3 mg/0.3 mL IJ SOAJ injection Inject one pen into the thigh as needed for anaphylactic reaction. Then CALL 911. Use second pen if needed thereafter 2 each 5   esomeprazole  (NEXIUM ) 40 MG capsule Take 1 capsule by mouth once daily 90 capsule 3   ezetimibe  (ZETIA ) 10 MG tablet Take 1 tablet (10 mg total) by mouth daily. 90 tablet 3   FARXIGA  10 MG TABS tablet TAKE 1 TABLET BY MOUTH ONCE DAILY BEFORE BREAKFAST 30 tablet 11   fluticasone  (FLONASE ) 50 MCG/ACT nasal spray Place 2 sprays into both nostrils daily. Prn nasal congestion 16 g 11   Fluticasone -Umeclidin-Vilant (TRELEGY ELLIPTA ) 200-62.5-25 MCG/ACT AEPB Inhale 1 Act into the lungs daily. 1 each 10   Fluticasone -Umeclidin-Vilant (TRELEGY ELLIPTA ) 200-62.5-25 MCG/ACT AEPB Inhale 1 puff into the lungs daily.     furosemide  (LASIX ) 20 MG tablet Take 1 tablet (20 mg total) by mouth daily AND 1 tablet (20 mg total) 3 (three) times a week at lunch. 126 tablet 0   GEMTESA  75 MG TABS Take 1 tablet (75 mg total) by mouth daily. 90 tablet 3   glipiZIDE  (GLUCOTROL ) 5 MG tablet Take 1 tablet (5 mg total) by mouth daily for 5 days. While on steroids if blood glucose >180 5 tablet 0   hydrocortisone  butyrate (LUCOID)  0.1 % CREA cream Apply topically 2 (two) times daily.     hydrocortisone  cream 1 % Apply topically 2 (two) times daily. 30 g 0   hydrOXYzine  (ATARAX ) 25 MG tablet Take 1 tablet (25 mg total) by mouth daily as needed for anxiety. 60 tablet 1   ipratropium (ATROVENT ) 0.06 % nasal spray Place 2 sprays into both nostrils 4 (four) times daily. 15 mL 12   ipratropium-albuterol  (DUONEB) 0.5-2.5 (3) MG/3ML SOLN USE 1 VIAL IN NEBULIZER TWICE DAILY AS NEEDED FOR WHEEZING OR SHORTNESS OF BREATH 360 mL 0   isosorbide  mononitrate (IMDUR ) 30 MG 24 hr tablet Take 1 tablet (30 mg total) by mouth 2 (two) times daily. 180 tablet 3   levocetirizine (XYZAL ) 5 MG tablet Take 1 tablet (5 mg total) by mouth at bedtime as needed for allergies. 90 tablet 3   linaclotide  (LINZESS ) 145 MCG CAPS capsule TAKE 1 CAPSULE BY MOUTH BEFORE BREAKFAST 90 capsule 3   metoprolol  succinate (TOPROL -XL) 50 MG 24 hr tablet TAKE WITH OR IMMEDIATELY FOLLOWING A MEAL 90 tablet 0   montelukast  (SINGULAIR ) 10 MG tablet Take 1 tablet (10 mg total) by mouth at bedtime. 90 tablet 3   nitroGLYCERIN  (NITROSTAT ) 0.4 MG SL tablet DISSOLVE ONE TABLET UNDER THE TONGUE EVERY 5 MINUTES AS NEEDED FOR CHEST PAIN.  DO NOT EXCEED A  TOTAL OF 3 DOSES IN 15 MINUTES 25 tablet 3   nystatin  (MYCOSTATIN ) 100000 UNIT/ML suspension TAKE 5MLS BY MOUTH 4 TIMES DAILY 240 mL 0   olopatadine  (PATANOL) 0.1 % ophthalmic solution INSTILL 1 DROP INTO EACH EYE TWICE DAILY 10 mL 1   omalizumab  (XOLAIR ) 300 MG/2ML injection Inject 300mg  into the skin every 14 days (total dose 375mg  every 14 days). Courier to pulm: [address], [address], [place] [address]. Appt on 07/01/23 4 mL 0   omalizumab  (XOLAIR ) 75 MG/0.5ML pen Inject 75mg  into the skin every 14 days (total dose 375mg  every 14 days). Courier to pulm: [address], [place] [address]. Appt on 07/01/23 1 mL 0   OneTouch Delica Lancets 33G MISC USE TO CHECK GLUCOSE IN THE MORNING AND AT BEDTIME 200 each 0    ONETOUCH VERIO test strip USE 1 STRIP TO CHECK GLUCOSE TWICE DAILY ONCE  IN  THE  MORNING  AND  ONCE  AT  BEDTIME 200 each 0   PARoxetine  (PAXIL ) 20 MG tablet Take 1 tablet (20 mg total) by mouth daily. 90 tablet 3   potassium chloride  (KLOR-CON  M) 10 MEQ tablet Take 10 mEq by mouth daily.     pregabalin  (LYRICA ) 75 MG capsule Take 1 capsule by mouth twice daily 60 capsule 3   rivaroxaban  (XARELTO ) 2.5 MG TABS tablet Take 1 tablet (2.5 mg total) by mouth 2 (two) times daily. 180 tablet 1   rosuvastatin  (CRESTOR ) 20 MG tablet TAKE 1 TABLET BY MOUTH AT BEDTIME 90 tablet 3   sodium chloride  (OCEAN) 0.65 % SOLN nasal spray Place 2 sprays into both nostrils daily as needed for congestion. 30 mL 11   Spacer/Aero-Holding Chambers (AEROCHAMBER MV) inhaler Use as instructed 1 each 0   tiZANidine  (ZANAFLEX ) 4 MG tablet Take 1 tablet (4 mg total) by mouth every 8 (eight) hours as needed for muscle spasms. 30 tablet 0   No current facility-administered medications for this visit.    Allergies:   Citrullus vulgaris, Latex, Shellfish allergy, Sulfa antibiotics, Sulfonamide derivatives, Misc. sulfonamide containing compounds, Penicillins, Soy allergy (obsolete), Tomato, and Other   Social History:  The patient  reports that she quit smoking about 13 years ago. Her smoking use included cigarettes. She started smoking about 51 years ago. She has a 76 pack-year smoking history. She has never used smokeless tobacco. She reports that she does not drink alcohol and does not use drugs.   Family History:   family history includes Acute myelogenous leukemia in her grandson; Arthritis in her sister; Brain cancer in her sister; Breast cancer (age of onset: 38) in her mother; Cancer in her father and mother; Colon cancer (age of onset: 74) in her father; Diabetes in her brother, father, and sister; Glaucoma in her father; Heart disease in her father; Heart failure in her father; Hypertension in her brother, father, and  sister; Kidney disease in her sister; Other in her father and mother.    Review of Systems: Review of Systems  Constitutional: Negative.   HENT: Negative.    Respiratory: Negative.    Cardiovascular: Negative.   Musculoskeletal:  Positive for back pain and joint pain.  Neurological: Negative.   Psychiatric/Behavioral: Negative.    All other systems reviewed and are negative.   PHYSICAL EXAM: There were no vitals taken for this visit. Constitutional:  oriented to person, place, and time. No distress.  HENT:  Head: Grossly normal Eyes:  no discharge. No scleral  icterus.  Neck: No JVD, no carotid bruits  Cardiovascular: Regular rate and rhythm, no murmurs appreciated Pulmonary/Chest: Clear to auscultation bilaterally, no wheezes or rails Abdominal: Soft.  no distension.  no tenderness.  Musculoskeletal: Normal range of motion Neurological:  normal muscle tone. Coordination normal. No atrophy Skin: Skin warm and dry Psychiatric: normal affect, pleasant  Recent Labs: 01/15/2024: ALT 27; BUN 13; Creatinine, Ser 1.03; Hemoglobin 15.1; Platelets 171; Potassium 3.7; Sodium 138    Lipid Panel Lab Results  Component Value Date   CHOL 145 12/31/2023   HDL 48.00 12/31/2023   LDLCALC 77 12/31/2023   TRIG 99.0 12/31/2023      Wt Readings from Last 3 Encounters:  01/20/24 194 lb 12.8 oz (88.4 kg)  01/15/24 190 lb (86.2 kg)  12/31/23 197 lb 6 oz (89.5 kg)     ASSESSMENT AND PLAN:   CAD/ CABG: With stable angina Currently with no symptoms of angina. No further workup at this time. Continue current medication regimen. Stable EF 45 to 50%  Ischemic cardiomyopathy Echocardiogram 45 to 50%, stable Low blood pressure limiting additional medication changes  Hyperlipidemia Cholesterol is at goal on the current lipid regimen. No changes to the medications were made.  Essential hypertension Blood pressure low, denies orthostasis symptoms  OSA on CPAP  On CPAP  COPD    stopped smoking several years ago Stable breathing, no recent COPD exacerbation  Chronic pain Chest, legs, back, arms Management per primary care  Leg swelling Stable on Lasix  60 daily, BMP stable Refill on potassium 60 daily  Diabetes type 2 with complications We have encouraged continued exercise, careful diet management in an effort to lose weight.    Total encounter time more than 40 minutes  Greater than 50% was spent in counseling and coordination of care with the patient   No orders of the defined types were placed in this encounter.    Signed, Velinda Lunger, M.D., Ph.D. 03/04/2024  [facility] [place], [place] [phone]

## 2024-03-07 ENCOUNTER — Ambulatory Visit: Admitting: Cardiovascular Disease

## 2024-03-07 DIAGNOSIS — I739 Peripheral vascular disease, unspecified: Secondary | ICD-10-CM

## 2024-03-07 DIAGNOSIS — E1169 Type 2 diabetes mellitus with other specified complication: Secondary | ICD-10-CM

## 2024-03-07 DIAGNOSIS — R609 Edema, unspecified: Secondary | ICD-10-CM

## 2024-03-07 DIAGNOSIS — I779 Disorder of arteries and arterioles, unspecified: Secondary | ICD-10-CM

## 2024-03-07 DIAGNOSIS — I10 Essential (primary) hypertension: Secondary | ICD-10-CM

## 2024-03-07 DIAGNOSIS — E785 Hyperlipidemia, unspecified: Secondary | ICD-10-CM

## 2024-03-07 DIAGNOSIS — E118 Type 2 diabetes mellitus with unspecified complications: Secondary | ICD-10-CM

## 2024-03-07 DIAGNOSIS — G4733 Obstructive sleep apnea (adult) (pediatric): Secondary | ICD-10-CM

## 2024-03-07 DIAGNOSIS — I700 Atherosclerosis of aorta: Secondary | ICD-10-CM

## 2024-03-07 DIAGNOSIS — I25118 Atherosclerotic heart disease of native coronary artery with other forms of angina pectoris: Secondary | ICD-10-CM

## 2024-03-07 DIAGNOSIS — I255 Ischemic cardiomyopathy: Secondary | ICD-10-CM

## 2024-03-07 DIAGNOSIS — I4891 Unspecified atrial fibrillation: Secondary | ICD-10-CM

## 2024-03-17 ENCOUNTER — Other Ambulatory Visit: Payer: Self-pay

## 2024-03-17 ENCOUNTER — Ambulatory Visit (INDEPENDENT_AMBULATORY_CARE_PROVIDER_SITE_OTHER)

## 2024-03-17 VITALS — BP 110/80 | HR 75 | Temp 98.2°F | Ht 65.0 in | Wt 193.6 lb

## 2024-03-17 DIAGNOSIS — J0100 Acute maxillary sinusitis, unspecified: Secondary | ICD-10-CM | POA: Diagnosis not present

## 2024-03-17 DIAGNOSIS — J3089 Other allergic rhinitis: Secondary | ICD-10-CM

## 2024-03-17 DIAGNOSIS — J4541 Moderate persistent asthma with (acute) exacerbation: Secondary | ICD-10-CM | POA: Diagnosis not present

## 2024-03-17 MED ORDER — IPRATROPIUM-ALBUTEROL 0.5-2.5 (3) MG/3ML IN SOLN: RESPIRATORY_TRACT | 0 refills | Status: DC

## 2024-03-17 MED ORDER — PREDNISONE 20 MG PO TABS: 40.0000 mg | ORAL_TABLET | Freq: Every day | ORAL | 0 refills | Status: DC

## 2024-03-17 MED ORDER — MONTELUKAST SODIUM 10 MG PO TABS
10.0000 mg | ORAL_TABLET | Freq: Every day | ORAL | 3 refills | Status: AC
Start: 2024-03-17 — End: ?

## 2024-03-17 MED ORDER — FLUTICASONE PROPIONATE 50 MCG/ACT NA SUSP: 2.0000 | Freq: Every day | NASAL | 3 refills | Status: AC

## 2024-03-17 MED ORDER — DOXYCYCLINE HYCLATE 100 MG PO TABS: 100.0000 mg | ORAL_TABLET | Freq: Two times a day (BID) | ORAL | 0 refills | Status: DC

## 2024-03-17 MED ORDER — AZELASTINE HCL 0.15 % NA SOLN: 2.0000 | Freq: Every day | NASAL | 6 refills | Status: AC | PRN

## 2024-03-17 MED ORDER — AZITHROMYCIN 250 MG PO TABS
ORAL_TABLET | ORAL | 0 refills | Status: DC
Start: 2024-03-17 — End: 2024-03-23

## 2024-03-17 NOTE — Progress Notes (Signed)
 Acute Office Visit  Subjective:    Patient ID: [name], female    DOB: 1961-03-28, 63 y.o.   MRN: [id number]  Chief Complaint  Patient presents with   Cough   Wheezing   Nasal Congestion   HPI Discussed the use of AI scribe software for clinical note transcription with the patient, who gave verbal consent to proceed.  History of Present Illness [name] is a 63 year old female with history of severe persistent asthma, OSA, smoking history, type 2 diabetes, significant cardiovascular history presents for evaluation of ongoing productive cough for about 2 weeks.  Initially, she had sneezing, which transitioned to post-nasal drip, causing throat and ear itching. The cough has become productive, with phlegm that started as frothy white and has turned yellow. No chest tightness, but she notes wheezing and coughing at night.  She has a history of asthma (has not needed hospitalization for asthma exacerbation) and has been using Trelegy every morning and albuterol  as a rescue inhaler twice a day since the onset of the cough. She reports waking up at night due to wheezing and coughing, with thick phlegm causing rattling sounds when lying down. She has not been using her azelastine  nasal spray or Flonase  due to running out of these medications. Takes Singulair  every day.  She denies chest pain, fever, chills, vomiting, nausea.   - She has allergies to sulfa and penicillin, with a history of itching from penicillin.She mentions environmental allergies, including to pecans, which are present near her home. Her sister and others in her household have also been experiencing respiratory symptoms, and she has avoided attending church due to concerns about exposure to illness.  Pertinent positive: Established with pulmonology (last OV on 01/20/24 with Dr. Isaiah) with history refusal to biological agents (declined Xolair  due to need for travel to [facility], cost related to the medication and  being on multiple pharmaceutical agents for other chronic conditions.) on Trelegy . Smoker, quit in 2012. H/O OSA intolerant to CPAP (managed by pulmonology).     ROS As per HPI    Objective:    BP 110/80 (BP Location: Right Arm, Patient Position: Sitting, Cuff Size: Normal)   Pulse 75   Temp 98.2 F (36.8 C) (Oral)   Ht 5' 5 (1.651 m)   Wt 193 lb 9.6 oz (87.8 kg)   SpO2 98%   BMI 32.22 kg/m    Physical Exam Constitutional:      General: She is not in acute distress. HENT:     Head: Normocephalic and atraumatic.     Right Ear: Tympanic membrane normal.     Left Ear: Tympanic membrane normal.     Nose: Congestion present.     Right Sinus: Maxillary sinus tenderness present.     Left Sinus: Maxillary sinus tenderness present.     Comments: Pain on palpation over maxillary sinuses b/l    Mouth/Throat:     Mouth: Mucous membranes are moist.     Pharynx: No oropharyngeal exudate.  Cardiovascular:     Rate and Rhythm: Normal rate.  Pulmonary:     Breath sounds: Wheezing (diffuse wheezing bilaterally, bibasilar crackles) present.     Comments: Productive cough, throughout office visit, white phlegm without hemoptysis  Abdominal:     Tenderness: There is no guarding.  Musculoskeletal:     Cervical back: Neck supple. No tenderness.     Right lower leg: No edema.     Left lower leg: No edema.  Lymphadenopathy:  Cervical: No cervical adenopathy.  Skin:    General: Skin is warm.     Findings: No bruising.  Neurological:     Mental Status: She is alert and oriented to person, place, and time.  Psychiatric:        Mood and Affect: Mood normal.     No results found for any visits on 03/17/24.     Assessment & Plan:  Moderate persistent asthma with acute exacerbation Assessment & Plan: - Patient counseled to schedule an appointment with pulmonologist Dr. Jacqulyn for acute visit.  - If breathing gets worse, worsening chest tightness, chest pain patient counseled to be  evaluated in the ED. Counseling provided on s/s of asthma exacerbation and need for hospitalization for further evaluation.  - Use duoneb twice a day for the next 5-7 days then as needed for wheezing.  - Albuterol  inhaler every 6 hourly as needed for wheezing, if needing to use albuterol  more than every 6 hourly she is counseled to reach out to her pulmonologist or us . - Continue Singular 10 mg at bedtime.  - Start: Take Azithromycin  500 mg today, then 250 mg once a day for next 4 days.  Take Doxycyline 100 mg, twice a day for the next 10 days.  Please take over the counter probiotic daily to help reduce side effects of antibiotic like diarrhea, yeast infection.  - Take Prednisone  40 mg once daily, in the morning with food for the next 5 days. Prednisone  s/e including hyperglycemia given patient's h/o DM II discussed.  - Reached out to Dr. Jacqulyn via secure chat (for curbside consult, appreciate his expertise), who agrees with treatment and plans on f/u with her in 1-2 weeks and agrees with ED evaluation if symptoms worsens.  - Avoid triggers, sick contact.      Orders: -     Ipratropium-Albuterol ; USE 1 VIAL IN NEBULIZER TWICE DAILY AS NEEDED FOR WHEEZING OR SHORTNESS OF BREATH  Dispense: 360 mL; Refill: 0 -     Montelukast  Sodium; Take 1 tablet (10 mg total) by mouth at bedtime.  Dispense: 90 tablet; Refill: 3 -     predniSONE ; Take 2 tablets (40 mg total) by mouth daily with breakfast for 5 days.  Dispense: 10 tablet; Refill: 0  Acute non-recurrent maxillary sinusitis Assessment & Plan: - Use nasal saline rinses 2-3 times a day and specially  before nose sprays such as with Neilmed Sinus Rinse.   - Use nasal steroid spray (Flonase  2 sprays in each nostril) daily.  - Use antihistamine medication like Zyrtec  10 mg or Allegra 180mg  or Xyzal  5mg  daily as need when allergy symptoms like runny nose, itchy nose, itchy eyes is worse/during pollen season.  - Start: Take Azithromycin  500 mg today,  then 250 mg once a day for next 4 days.  Take Doxycyline 100 mg, twice a day for the next 10 days.  Please take over the counter probiotic daily to help reduce side effects of antibiotic like diarrhea, yeast infection.  - Take Prednisone  40 mg once daily, in the morning with food for the next 5 days. Prednisone  s/e including hyperglycemia given patient's h/o DM II discussed.     Orders: -     Azithromycin ; Take 2 tablets on day 1, then 1 tablet daily on days 2 through 5  Dispense: 6 tablet; Refill: 0 -     Doxycycline  Hyclate; Take 1 tablet (100 mg total) by mouth 2 (two) times daily for 10 days.  Dispense: 20 tablet;  Refill: 0  Non-seasonal allergic rhinitis, unspecified trigger -     Azelastine  HCl; Place 2 sprays into both nostrils daily as needed (allergies).  Dispense: 30 mL; Refill: 6 -     Fluticasone  Propionate; Place 2 sprays into both nostrils daily. Prn nasal congestion  Dispense: 1 g; Refill: 3   I personally spent a total of 45 minutes in the care of the patient today including preparing to see the patient, getting/reviewing separately obtained history, performing a medically appropriate exam/evaluation, counseling and educating, placing orders, documenting clinical information in the EHR, and coordinating care.  Return in about 6 days (around 03/23/2024) for Scheduled for TOC and f/u on asthma exacerbation .  Luke Shade, MD

## 2024-03-17 NOTE — Assessment & Plan Note (Addendum)
-   Patient counseled to schedule an appointment with pulmonologist Dr. Jacqulyn for acute visit.  - If breathing gets worse, worsening chest tightness, chest pain patient counseled to be evaluated in the ED. Counseling provided on s/s of asthma exacerbation and need for hospitalization for further evaluation.  - Use duoneb twice a day for the next 5-7 days then as needed for wheezing.  - Albuterol  inhaler every 6 hourly as needed for wheezing, if needing to use albuterol  more than every 6 hourly she is counseled to reach out to her pulmonologist or us . - Continue Singular 10 mg at bedtime.  - Start: Take Azithromycin  500 mg today, then 250 mg once a day for next 4 days.  Take Doxycyline 100 mg, twice a day for the next 10 days.  Please take over the counter probiotic daily to help reduce side effects of antibiotic like diarrhea, yeast infection.  - Take Prednisone  40 mg once daily, in the morning with food for the next 5 days. Prednisone  s/e including hyperglycemia given patient's h/o DM II discussed.  - Reached out to Dr. Jacqulyn via secure chat (for curbside consult, appreciate his expertise), who agrees with treatment and plans on f/u with her in 1-2 weeks and agrees with ED evaluation if symptoms worsens.  - Avoid triggers, sick contact.

## 2024-03-17 NOTE — Assessment & Plan Note (Signed)
-   Use nasal saline rinses 2-3 times a day and specially  before nose sprays such as with Neilmed Sinus Rinse.   - Use nasal steroid spray (Flonase  2 sprays in each nostril) daily.  - Use antihistamine medication like Zyrtec  10 mg or Allegra 180mg  or Xyzal  5mg  daily as need when allergy symptoms like runny nose, itchy nose, itchy eyes is worse/during pollen season.  - Start: Take Azithromycin  500 mg today, then 250 mg once a day for next 4 days.  Take Doxycyline 100 mg, twice a day for the next 10 days.  Please take over the counter probiotic daily to help reduce side effects of antibiotic like diarrhea, yeast infection.  - Take Prednisone  40 mg once daily, in the morning with food for the next 5 days. Prednisone  s/e including hyperglycemia given patient's h/o DM II discussed.

## 2024-03-17 NOTE — Patient Instructions (Addendum)
-   Use nasal saline rinses 2-3 times a day and specially  before nose sprays such as with Neilmed Sinus Rinse.   - Use nasal steroid spray (Flonase  2 sprays in each nostril) daily.  - Use antihistamine medication like Zyrtec  10 mg or Allegra 180mg  or Xyzal  5mg  daily as need when allergy symptoms like runny nose, itchy nose, itchy eyes is worse/during pollen season.  - I am starting you on two antibiotics:  Take Azithromycin  500 mg today, then 250 mg once a day for next 4 days.  Take Doxycyline 100 mg, twice a day for the next 10 days.  Please take over the counter probiotic daily to help reduce side effects of antibiotic like diarrhea, yeast infection.  - Take Prednisone  40 mg once daily, in the morning with food for the next 5 days.  - Please reach out to Dr. Jacqulyn office for a follow up. Call this number: [phone]  and schedule an appointment with Nickolas Alm Cellar, M.D.  - If breathing gets worse, you have chest pain, worsening chest tightness you should be seen in the ED.  - Use duoneb twice a day for the next 5-7 days then as needed for wheezing.  - It's okay for you to use albuterol  inhaler every 6 hourly as needed for wheezing.  - Keep your upcoming appointment with me as it is.

## 2024-03-18 ENCOUNTER — Ambulatory Visit: Payer: Self-pay | Admitting: Internal Medicine

## 2024-03-18 ENCOUNTER — Telehealth: Payer: Self-pay

## 2024-03-18 NOTE — Telephone Encounter (Signed)
 She can use nasal steroid/Flonase  2 puff in each nostril daily for 10 days then 1 puff in each nostril daily after that.  Flonase  was prescribed to her during her visit yesterday.  Luke Shade, MD

## 2024-03-18 NOTE — Telephone Encounter (Signed)
 Pt requesting refill for esomeprazole  40 mg originally prescribed by Dr. Glenys Ferrari, requested during triage encounter for 8/22. Upon further investigation after disconnecting call, nurse found that this script was last filled 12/23/23 for 90-day supply with 3 refills to [facility] in [place]. Attempted to contact pt again to inform, no answer, LVM informing her that script may still have refills available at [facility], advised call pharmacy and if still in need of refill to please call back.

## 2024-03-18 NOTE — Telephone Encounter (Signed)
 Copied from CRM [id number]. Topic: Clinical - Prescription Issue >> Mar 18, 2024 10:02 AM Franky GRADE wrote: Reason for CRM: [facility] pharmacy is calling to advise that patient's insurance will not cover the Azelastine  HCl 0.15 % SOLN [[id number]] but will cover the 1%. They would like to know if it's okay to change.

## 2024-03-18 NOTE — Telephone Encounter (Signed)
 Spoke with Asberry at [facility] to inform her that per Dr Abbey it is OK to change the prescription to the covered dosage. Asberry verbalized understanding.

## 2024-03-18 NOTE — Telephone Encounter (Addendum)
 FYI Only or Action Required?: Action required by provider: refusing disposition, requesting appt at [facility] earlier than October, just seen by PCP yesterday, prescribed meds, and following up with PCP on 8/27.  Patient is followed in Pulmonology for COPD and OSA, last seen on 01/20/2024 by Isaiah Scrivener, MD.  Called Nurse Triage reporting Shortness of Breath, Cough, light yellow sputum, and chest pain with coughing only.  Symptoms began a week ago.  Interventions attempted: OTC medications: zarbee's cough med per PCP, Prescription medications: antibx, steroid, z-pak x2, Rescue inhaler, Maintenance inhaler, and Increased fluids/rest.  Symptoms are: gradually improving with only z-pak started last night, starting other meds today.  Triage Disposition: See HCP Within 4 Hours (Or PCP Triage)  Patient/caregiver understands and will follow disposition?: No, refuses disposition     Copied from CRM [id number]. Topic: Clinical - Red Word Triage >> Mar 18, 2024  8:52 AM Isabell A wrote: Red Word that prompted transfer to Nurse Triage: Patient was seen by PCP and was told to follow up with pulmonologist - experiencing a cough with a lot of phlegm and trouble breathing sometimes. Reason for Disposition  [1] MILD difficulty breathing (e.g., minimal/no SOB at rest, SOB with walking, pulse < 100) AND [2] NEW-onset or WORSE than normal  Answer Assessment - Initial Assessment Questions E2C2 Pulmonary Triage - Initial Assessment Questions Chief Complaint (e.g., cough, sob, wheezing, fever, chills, sweat or additional symptoms) *Go to specific symptom protocol after initial questions. Went to PCP yesterday Still coughing that mess up Told me to take z-pak, going to also start steroid and antibx right along with another z-pak, had me get a neti-pot, gonna use nasal spray Pulm wanted me to do allergy shots, but PCPs think too allergic to too many meds at this point Coughing fit Opening up and coughing up  more, stuff been coming up more, coming up now, still thick, some light yellow Speaking in full sentences More SOB right now but not had trelegy yet today SOB with coughing and with moving around Little bit chest pain, think from coughing, only with coughing No dizziness, nausea, excessive sweating No fever  How long have symptoms been present? Started last week, Sunday before last, ears started itching on inside like with allergies then later on started coughing and phlegm started building up  MEDICINES:   Have you used any OTC meds to help with symptoms? Yes If yes, ask What medications? Zarbee's cough med per PCP, helped for little while, start coughing with sleep, not getting any sleep  Have you used your inhalers/maintenance medication? Yes If yes, What medications? Having to use albuterol  inhaler more often, every 6 hours didn't want to do every 4 hours, 2x/day Trelegy PCP wanting her to use it every 4 hours right now, getting some relief with albuterol , don't know if need higher dosage or what, works for little while then coughing again and airway constricted  OXYGEN : Do you wear supplemental oxygen ? No  Do you monitor your oxygen  levels? Yes If yes, What is your reading (oxygen  level) today? Before trelegy 92% 37 bpm then 41 bpm then jumped to 94% 85 bpm, now 94% 86-87 bpm  What is your usual oxygen  saturation reading?  (Note: Pulmonary O2 sats should be 90% or greater) Don't know what usual reading is  6. CARDIAC HISTORY: Do you have any history of heart disease? (e.g., heart attack, angina, bypass surgery, angioplasty)      significant  7. LUNG HISTORY: Do you have any history of  lung disease?  (e.g., pulmonary embolus, asthma, emphysema)     Significant   Appt next week with PCP to follow up 8/27 Sending message to pulm to request earlier appt than October Sending message to PCP requesting nexium /esomeprazole  40 mg, had been prescribed by  former PCP Bascom Ferrari NP     Advised follow with PCP closely, follow PCP instructions for meds, call back if worsening symptoms or no improvement, seek immediate care if worsening SOB or start having chest pain when not coughing or if see HR below 50 bpm when pulse ox been on for a minute and settled on number.  Protocols used: Breathing Difficulty-A-AH   EDIT: Nexium  previously prescribed by Glenys Ferrari, MD.

## 2024-03-18 NOTE — Telephone Encounter (Signed)
 Per secure chat between the patient's and  Dr. Isaiah- it seems the correct course of action was provided, we can set up follow up appointment within 1-2 weeks, patient will need to go to ER for worsening symptoms.  LMTCB. Dr. Isaiah has an appt on 8/29 at 9:30am.  E2C2 please advise when patient calls back.

## 2024-03-21 ENCOUNTER — Telehealth: Payer: Self-pay | Admitting: Cardiology

## 2024-03-21 MED ORDER — POTASSIUM CHLORIDE CRYS ER 10 MEQ PO TBCR
10.0000 meq | EXTENDED_RELEASE_TABLET | Freq: Every day | ORAL | 0 refills | Status: DC
Start: 2024-03-21 — End: 2024-04-25

## 2024-03-21 NOTE — Telephone Encounter (Signed)
 Pt's medication was sent to pt's pharmacy as requested. Confirmation received.

## 2024-03-21 NOTE — Telephone Encounter (Signed)
*  STAT* If patient is at the pharmacy, call can be transferred to refill team.   1. Which medications need to be refilled? (please list name of each medication and dose if known)   potassium chloride  (KLOR-CON  M) 10 MEQ tablet    4. Which pharmacy/location (including street and city if local pharmacy) is medication to be sent to?  [facility] [address] ([address]), [place] - [address] Phone: [phone]  Fax: [phone]       5. Do they need a 30 day or 90 day supply? 90  Pt states she is completely out

## 2024-03-23 ENCOUNTER — Ambulatory Visit (INDEPENDENT_AMBULATORY_CARE_PROVIDER_SITE_OTHER)

## 2024-03-23 VITALS — BP 116/68 | HR 84 | Temp 98.7°F | Ht 65.0 in | Wt 193.8 lb

## 2024-03-23 DIAGNOSIS — E669 Obesity, unspecified: Secondary | ICD-10-CM | POA: Diagnosis not present

## 2024-03-23 DIAGNOSIS — I700 Atherosclerosis of aorta: Secondary | ICD-10-CM

## 2024-03-23 DIAGNOSIS — F32A Depression, unspecified: Secondary | ICD-10-CM

## 2024-03-23 DIAGNOSIS — E1159 Type 2 diabetes mellitus with other circulatory complications: Secondary | ICD-10-CM | POA: Diagnosis not present

## 2024-03-23 DIAGNOSIS — Z7984 Long term (current) use of oral hypoglycemic drugs: Secondary | ICD-10-CM

## 2024-03-23 DIAGNOSIS — J301 Allergic rhinitis due to pollen: Secondary | ICD-10-CM

## 2024-03-23 DIAGNOSIS — Z6832 Body mass index (BMI) 32.0-32.9, adult: Secondary | ICD-10-CM | POA: Diagnosis not present

## 2024-03-23 DIAGNOSIS — E119 Type 2 diabetes mellitus without complications: Secondary | ICD-10-CM

## 2024-03-23 DIAGNOSIS — K219 Gastro-esophageal reflux disease without esophagitis: Secondary | ICD-10-CM

## 2024-03-23 DIAGNOSIS — F39 Unspecified mood [affective] disorder: Secondary | ICD-10-CM

## 2024-03-23 DIAGNOSIS — G629 Polyneuropathy, unspecified: Secondary | ICD-10-CM | POA: Diagnosis not present

## 2024-03-23 DIAGNOSIS — E118 Type 2 diabetes mellitus with unspecified complications: Secondary | ICD-10-CM | POA: Diagnosis not present

## 2024-03-23 DIAGNOSIS — E785 Hyperlipidemia, unspecified: Secondary | ICD-10-CM

## 2024-03-23 DIAGNOSIS — I152 Hypertension secondary to endocrine disorders: Secondary | ICD-10-CM

## 2024-03-23 DIAGNOSIS — Z79899 Other long term (current) drug therapy: Secondary | ICD-10-CM

## 2024-03-23 DIAGNOSIS — I5022 Chronic systolic (congestive) heart failure: Secondary | ICD-10-CM

## 2024-03-23 DIAGNOSIS — L309 Dermatitis, unspecified: Secondary | ICD-10-CM | POA: Diagnosis not present

## 2024-03-23 DIAGNOSIS — J4489 Other specified chronic obstructive pulmonary disease: Secondary | ICD-10-CM

## 2024-03-23 DIAGNOSIS — K582 Mixed irritable bowel syndrome: Secondary | ICD-10-CM

## 2024-03-23 DIAGNOSIS — E1169 Type 2 diabetes mellitus with other specified complication: Secondary | ICD-10-CM | POA: Diagnosis not present

## 2024-03-23 DIAGNOSIS — N3281 Overactive bladder: Secondary | ICD-10-CM

## 2024-03-23 DIAGNOSIS — J439 Emphysema, unspecified: Secondary | ICD-10-CM

## 2024-03-23 LAB — VITAMIN D 25 HYDROXY (VIT D DEFICIENCY, FRACTURES): VITD: 36.57 ng/mL (ref 30.00–100.00)

## 2024-03-23 LAB — MICROALBUMIN / CREATININE URINE RATIO
Creatinine,U: 68.2 mg/dL
Microalb Creat Ratio: 13.6 mg/g (ref 0.0–30.0)
Microalb, Ur: 0.9 mg/dL (ref 0.0–1.9)

## 2024-03-23 LAB — CBC WITH DIFFERENTIAL/PLATELET
Basophils Absolute: 0 K/uL (ref 0.0–0.1)
Basophils Relative: 0.6 % (ref 0.0–3.0)
Eosinophils Absolute: 0.1 K/uL (ref 0.0–0.7)
Eosinophils Relative: 1.7 % (ref 0.0–5.0)
HCT: 46.3 % — ABNORMAL HIGH (ref 36.0–46.0)
Hemoglobin: 15.1 g/dL — ABNORMAL HIGH (ref 12.0–15.0)
Lymphocytes Relative: 38 % (ref 12.0–46.0)
Lymphs Abs: 2.7 K/uL (ref 0.7–4.0)
MCHC: 32.6 g/dL (ref 30.0–36.0)
MCV: 96.5 fl (ref 78.0–100.0)
Monocytes Absolute: 0.5 K/uL (ref 0.1–1.0)
Monocytes Relative: 6.7 % (ref 3.0–12.0)
Neutro Abs: 3.8 K/uL (ref 1.4–7.7)
Neutrophils Relative %: 53 % (ref 43.0–77.0)
Platelets: 169 K/uL (ref 150.0–400.0)
RBC: 4.8 Mil/uL (ref 3.87–5.11)
RDW: 14.1 % (ref 11.5–15.5)
WBC: 7.1 K/uL (ref 4.0–10.5)

## 2024-03-23 LAB — COMPREHENSIVE METABOLIC PANEL WITH GFR
ALT: 46 U/L — ABNORMAL HIGH (ref 0–35)
AST: 30 U/L (ref 0–37)
Albumin: 4.4 g/dL (ref 3.5–5.2)
Alkaline Phosphatase: 75 U/L (ref 39–117)
BUN: 21 mg/dL (ref 6–23)
CO2: 26 meq/L (ref 19–32)
Calcium: 9.7 mg/dL (ref 8.4–10.5)
Chloride: 104 meq/L (ref 96–112)
Creatinine, Ser: 1.12 mg/dL (ref 0.40–1.20)
GFR: 51.57 mL/min — ABNORMAL LOW (ref >60.00)
Glucose, Bld: 108 mg/dL — ABNORMAL HIGH (ref 70–99)
Potassium: 3.8 meq/L (ref 3.5–5.1)
Sodium: 143 meq/L (ref 135–145)
Total Bilirubin: 0.4 mg/dL (ref 0.2–1.2)
Total Protein: 7.4 g/dL (ref 6.0–8.3)

## 2024-03-23 LAB — HEMOGLOBIN A1C: Hgb A1c MFr Bld: 6.8 % — ABNORMAL HIGH (ref 4.6–6.5)

## 2024-03-23 LAB — VITAMIN B12: Vitamin B-12: 799 pg/mL (ref 211–911)

## 2024-03-23 MED ORDER — PREGABALIN 75 MG PO CAPS: 75.0000 mg | ORAL_CAPSULE | Freq: Two times a day (BID) | ORAL | 1 refills | Status: DC

## 2024-03-23 MED ORDER — AEROCHAMBER MV MISC: 0 refills | Status: AC

## 2024-03-23 MED ORDER — ESOMEPRAZOLE MAGNESIUM 40 MG PO CPDR: 40.0000 mg | DELAYED_RELEASE_CAPSULE | Freq: Every day | ORAL | 3 refills | Status: AC

## 2024-03-23 MED ORDER — SALINE SPRAY 0.65 % NA SOLN: 2.0000 | Freq: Every day | NASAL | 11 refills | Status: AC | PRN

## 2024-03-23 MED ORDER — HYDROXYZINE HCL 25 MG PO TABS: 25.0000 mg | ORAL_TABLET | Freq: Every day | ORAL | Status: DC | PRN

## 2024-03-23 MED ORDER — PAROXETINE HCL 20 MG PO TABS: 20.0000 mg | ORAL_TABLET | Freq: Every day | ORAL | 3 refills | Status: AC

## 2024-03-23 MED ORDER — FARXIGA 10 MG PO TABS: 10.0000 mg | ORAL_TABLET | Freq: Every day | ORAL | 11 refills | Status: DC

## 2024-03-23 MED ORDER — HYDROCORTISONE 1 % EX CREA: TOPICAL_CREAM | Freq: Two times a day (BID) | CUTANEOUS | 2 refills | Status: AC

## 2024-03-23 MED ORDER — GEMTESA 75 MG PO TABS: 1.0000 | ORAL_TABLET | Freq: Every day | ORAL | 3 refills | Status: DC

## 2024-03-23 MED ORDER — EZETIMIBE 10 MG PO TABS: 10.0000 mg | ORAL_TABLET | Freq: Every day | ORAL | 3 refills | Status: DC

## 2024-03-23 MED ORDER — ASPIRIN EC 81 MG PO TBEC: 81.0000 mg | DELAYED_RELEASE_TABLET | Freq: Every day | ORAL | 3 refills | Status: AC

## 2024-03-23 MED ORDER — EPINEPHRINE 0.3 MG/0.3ML IJ SOAJ: INTRAMUSCULAR | 5 refills | Status: AC

## 2024-03-23 MED ORDER — ROSUVASTATIN CALCIUM 20 MG PO TABS: 20.0000 mg | ORAL_TABLET | Freq: Every day | ORAL | 3 refills | Status: AC

## 2024-03-23 NOTE — Assessment & Plan Note (Signed)
 Symptoms stable with Gemtesa  75 mg once daily, continue.  She has been through pelvic floor therapy, has seen urology in the past.  If symptom worsens recommend reevaluation by urology.

## 2024-03-23 NOTE — Assessment & Plan Note (Signed)
 Chronic.  Involving cervical spine, lower leg.  Symptoms stable with Lyrica  75 mg twice daily.  Discussed in detail with the patient about potential side effects related to Lyrica  including dizziness, feeling sleepy.  Patient verbalizes understanding.  PDMP reviewed.  Continue Lyrica  75 mg twice daily.  Refill sent.

## 2024-03-23 NOTE — Assessment & Plan Note (Signed)
 Continue close follow-up with [facility].  No surgical intervention was recommended during evaluation at [facility] as mentioned on overview.  No new concerns.

## 2024-03-23 NOTE — Assessment & Plan Note (Signed)
 With recent exacerbation for which patient was seen in the clinic by me on 03/17/2024.  She finished azithromycin .  Is continuing to take prednisone , doxycycline , DuoNeb, as needed albuterol  inhaler with significant improvement in cough.

## 2024-03-23 NOTE — Assessment & Plan Note (Signed)
 Symptoms stable on escitalopram 40 mg daily, check B12.  Refill sent

## 2024-03-23 NOTE — Assessment & Plan Note (Signed)
 Secondary to allergen.  Currently asymptomatic.  She has been prescribed Pataday  in the past but patient has not refilled this as it is not covered by her insurance.  Recommend using preservative-free lubricating eyedrop like refresh 1 drop as needed daily.

## 2024-03-23 NOTE — Assessment & Plan Note (Signed)
 Involving elbows.  Currently asymptomatic.  Okay to use hydrocortisone  1% cream twice daily as needed for 14 days in a row.  Potential side effect associated with prolonged use of steroid cream discussed with the patient.

## 2024-03-23 NOTE — Assessment & Plan Note (Addendum)
 Established and managed by: Cardiology.   She is also prescribed an managed following by her cardiologist:  - Imdur  30 mg twice daily - Nitroglycerin  0.4 mg sublingual as needed, she has not needed this.  - K-Chlor 10 mEq daily - Rivaroxaban  2.5 mg twice daily, -  Lasix  20 mg daily  - Alirocumab  75 mg every 14 days - Last lipid panel checked on 12/31/2023 with LDL of 77

## 2024-03-23 NOTE — Assessment & Plan Note (Signed)
 Most recent A1c 6.3 percent.  Repeat A1c today.  Check urine microalbumin level. Continue Farxiga  10 mg daily.  She is currently not taking glipizide .  Previously glipizide  was only prescribed for 5 days.  Continue low carbohydrate diet.

## 2024-03-23 NOTE — Assessment & Plan Note (Signed)
 Symptoms stable on paroxetine  20 mg daily with as needed hydroxyzine  25 mg for situational anxiety.  Continue we also discussed tapering off of paroxetine  and potentially starting escitalopram to help reduce side effects related to medication.  Revisit during follow-up appointment.

## 2024-03-23 NOTE — Assessment & Plan Note (Signed)
 In the past patient has tried Linzess , dicyclomine  developed severe diarrhea on it.  Currently she is managing this symptomatically.  If symptom worsens I recommend referral to GI.  Discussed stress management to help reduce recurrence of symptoms.

## 2024-03-23 NOTE — Assessment & Plan Note (Signed)
 Blood pressure within goal.

## 2024-03-23 NOTE — Assessment & Plan Note (Signed)
 Use Nettie pot 2-3 times a day as needed, nasal Flonase  daily.  If symptoms not controlled with this regimen it is okay to try azelastine  as needed, OTC antihistamine daily.  Okay to use nasal saline spray for dry nose.

## 2024-03-23 NOTE — Progress Notes (Signed)
 Established Patient Office Visit TOC from Dr. Hope F/U asthma exacerbation from 03/17/2024   Subjective  Patient ID: [name], female    DOB: 08-03-1960  Age: 63 y.o. MRN: [id number]  No chief complaint on file.   She  has a past medical history of Abnormal feces, Abnormality of both breasts on screening mammogram (05/14/2020), Acute esophagogastric ulcer, Allergy, Anemia, Annual physical exam (01/14/2021), Anxiety and depression (04/05/2018), Arthritis, Asthma, Atypical chest pain (01/20/2014), Back ache (06/19/2015), Bacterial vaginitis, Benign breast cyst in female, right (02/22/2020), Benign neoplasm of ascending colon, Bilateral hip pain (04/19/2020), Blood in stool, Brachial neuritis or radiculitis NOS, Brain tumor (benign) (HCC) (07/2017), Breast mass, right (01/14/2021), Bronchitis, Bunion of right foot (08/26/2018), Cardiac arrhythmia, CCF (congestive cardiac failure) (HCC) (06/19/2015), Cervical neck pain with evidence of disc disease, Chronic combined systolic and diastolic CHF (congestive heart failure) (HCC), Chronic neck pain (12/18/2017), Chronic pain, Chronic pain of inguinal region (07/24/2021), CLAUDICATION (08/23/2010), Cocaine abuse, in remission [facility]), Colon polyp (06/19/2015), Colon polyps, COPD (chronic obstructive pulmonary disease) (HCC), COPD exacerbation (HCC) (03/12/2022), Coronary artery disease, Coronary artery disease involving native coronary artery of native heart without angina pectoris (08/23/2010), Depression, Depression, Diabetes mellitus without complication (HCC), Fall (05/14/2018), Family history of colon cancer, Family history of colon cancer (06/19/2015), Gastritis without bleeding, Generalized headaches, GERD (gastroesophageal reflux disease), Headache, Heart murmur, Hematochezia, Hematochezia (01/11/2015), Hepatitis, History of cervical cancer, History of cervical cancer, History of colonic polyps (01/11/2015), History of drug abuse (HCC), History of  drug abuse (HCC), History of hepatitis B, History of MI (myocardial infarction), HTN (hypertension) (12/20/2011), Hyperlipidemia, Hypertension, Insomnia (04/19/2020), Intractable vomiting with nausea, Intractable vomiting with nausea, Ischemic cardiomyopathy, Lipoma of right lower extremity (08/30/2020), Mild intermittent asthma (10/13/2019), Myocardial infarction (HCC) (2003, 2012), Need for pneumococcal 20-valent conjugate vaccination (07/06/2022), Numbness and tingling in right hand (02/24/2018), Orthostatic hypotension (04/30/2015), PAD (peripheral artery disease) (HCC), Pituitary mass (HCC), PND (post-nasal drip) (07/24/2021), Polyp of colon, Pruritus of both eyes (09/21/2020), Right knee pain (12/20/2011), 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  HPI Discussed the use of AI scribe software for clinical note transcription with the patient, who gave verbal consent to proceed.  History of Present Illness [name] is a 63 year old female with asthma and coronary artery disease who presents for a medication check and establishing care.  She recently completed a course of azithromycin  and is currently on antibiotics and steroids for an asthma exacerbation. She uses a nebulizer, neti pot, and nasal spray to manage her symptoms. Her cough is loosening, and she is expectorating dark sputum. She uses an albuterol  inhaler every six hours as needed, Trelegy once daily in the morning, and Singulair  at night. She also uses Flonase  and azelastine  nasal sprays as needed and takes Xyzal  twice daily for allergies, though it was previously prescribed as  needed.  She has a history of high cholesterol and coronary artery disease, having undergone a quadruple bypass. She is on rosuvastatin , Crestor , and receives Praluent  injections every fourteen days. She takes aspirin  81 mg daily. She reports a history of coronary artery disease and states that she was told she has an enlarged heart on the right side.  She has IBS and previously used Linzess , which caused severe diarrhea and discomfort, leading to  discontinuation. She is not currently seeing a GI doctor. She takes Nexium  daily for acid reflux, which is the only medication that effectively manages her symptoms. She has tried other medications without success.  She is on Lasix , taking one tablet daily, with the option to take an additional tablet if fluid buildup is significant. She takes potassium supplements and Farxiga  in the morning. She experiences foam in her urine, particularly in the morning.  She experiences overactive bladder symptoms and takes Gemtesa , though she reports mixed effectiveness. She has previously attended pelvic floor therapy.  She has a history of neuropathy and takes pregabalin  for nerve pain, particularly after surgeries. She takes one tablet in the evening.  She is on Xarelto , which replaced Plavix , for blood thinning. She also takes Zetia  for cholesterol management.  She uses hydrocortisone  cream for cracked elbows as needed and applies preservative-free moisturizer after showers.  She has a history of inhalant substance use at age 48, related to past trauma, but has not engaged in this behavior since.  ROS As per HPI    Objective:     BP 116/68 (BP Location: Right Arm, Patient Position: Sitting, Cuff Size: Normal)   Pulse 84   Temp 98.7 F (37.1 C) (Oral)   Ht 5' 5 (1.651 m)   Wt 193 lb 12.8 oz (87.9 kg)   SpO2 97%   BMI 32.25 kg/m      03/23/2024    1:11 PM 03/17/2024    8:12 AM 12/31/2023   10:10 AM  Depression screen PHQ 2/9  Decreased  Interest 0 2 1  Down, Depressed, Hopeless 0 0 0  PHQ - 2 Score 0 2 1  Altered sleeping 1 3 2   Tired, decreased energy 0 1 2  Change in appetite 0 0 0  Feeling bad or failure about yourself  0 0 0  Trouble concentrating 0 0 0  Moving slowly or fidgety/restless 0 0 0  Suicidal thoughts 0 0 0  PHQ-9 Score 1 6 5   Difficult doing work/chores Not difficult at all Not difficult at all Somewhat difficult      03/23/2024    1:11 PM 03/17/2024    8:12 AM 12/31/2023   10:10 AM 09/04/2023   11:34 AM  GAD 7 : Generalized Anxiety Score  Nervous, Anxious, on Edge 0 0 0 0  Control/stop worrying 0 0 0 0  Worry too much - different things 0 0 0 0  Trouble relaxing 0 0 0 1  Restless 0 0 0 0  Easily annoyed or irritable 0 0 1 1  Afraid - awful might happen 0 0 0 0  Total GAD 7 Score 0 0 1 2  Anxiety Difficulty Not difficult at all Not difficult at all Not difficult at all Not difficult at all      03/23/2024    1:11 PM 03/17/2024    8:12 AM 12/31/2023   10:10 AM  Depression screen PHQ 2/9  Decreased Interest 0 2 1  Down, Depressed, Hopeless 0 0 0  PHQ - 2 Score 0 2 1  Altered sleeping 1 3 2   Tired, decreased energy 0 1 2  Change in appetite 0 0 0  Feeling bad or failure about yourself  0 0 0  Trouble concentrating 0 0 0  Moving slowly or fidgety/restless 0 0 0  Suicidal thoughts 0 0 0  PHQ-9 Score 1 6 5   Difficult doing work/chores Not difficult at all Not difficult at all Somewhat difficult  03/23/2024    1:11 PM 03/17/2024    8:12 AM 12/31/2023   10:10 AM 09/04/2023   11:34 AM  GAD 7 : Generalized Anxiety Score  Nervous, Anxious, on Edge 0 0 0 0  Control/stop worrying 0 0 0 0  Worry too much - different things 0 0 0 0  Trouble relaxing 0 0 0 1  Restless 0 0 0 0  Easily annoyed or irritable 0 0 1 1  Afraid - awful might happen 0 0 0 0  Total GAD 7 Score 0 0 1 2  Anxiety Difficulty Not difficult at all Not difficult at all Not difficult at all Not difficult at all   SDOH Screenings    Food Insecurity: No Food Insecurity (08/18/2023)  Housing: Unknown (08/18/2023)  Transportation Needs: No Transportation Needs (08/18/2023)  Utilities: Not At Risk (08/18/2023)  Alcohol Screen: Low Risk  (08/18/2023)  Depression (PHQ2-9): Low Risk  (03/23/2024)  Recent Concern: Depression (PHQ2-9) - Medium Risk (03/17/2024)  Financial Resource Strain: Low Risk  (08/18/2023)  Physical Activity: Insufficiently Active (08/18/2023)  Social Connections: Moderately Integrated (08/18/2023)  Stress: No Stress Concern Present (08/18/2023)  Tobacco Use: Medium Risk (03/23/2024)  Health Literacy: Adequate Health Literacy (08/18/2023)     Physical Exam Constitutional:      General: She is not in acute distress. HENT:     Head: Normocephalic and atraumatic.     Right Ear: Tympanic membrane normal.     Left Ear: Tympanic membrane normal.     Nose: No congestion.  Cardiovascular:     Rate and Rhythm: Normal rate.  Pulmonary:     Breath sounds: Wheezing (mild bibasilar wheezing on deep inspiration) present.     Comments: Deep inspiration exacerbating cough. Musculoskeletal:     Cervical back: Neck supple.     Right lower leg: No edema.     Left lower leg: No edema.  Skin:    General: Skin is warm.  Neurological:     Mental Status: She is alert.        No results found for any visits on 03/23/24.  The ASCVD Risk score (Arnett DK, et al., 2019) failed to calculate for the following reasons:   Risk score cannot be calculated because patient has a medical history suggesting prior/existing ASCVD     Assessment & Plan:   Gastroesophageal reflux disease without esophagitis Assessment & Plan: Symptoms stable on escitalopram 40 mg daily, check B12.  Refill sent  Orders: -     Esomeprazole  Magnesium ; Take 1 capsule (40 mg total) by mouth daily.  Dispense: 90 capsule; Refill: 3  Sensorimotor neuropathy Assessment & Plan: Chronic.  Involving cervical spine, lower leg.  Symptoms stable with  Lyrica  75 mg twice daily.  Discussed in detail with the patient about potential side effects related to Lyrica  including dizziness, feeling sleepy.  Patient verbalizes understanding.  PDMP reviewed.  Continue Lyrica  75 mg twice daily.  Refill sent.    Orders: -     Pregabalin ; Take 1 capsule (75 mg total) by mouth 2 (two) times daily.  Dispense: 180 capsule; Refill: 1  Hyperlipidemia associated with type 2 diabetes mellitus (HCC) Assessment & Plan: Continue Crestor  20 mg, Zetia  10 mg, Alirocumab  75 mg every 14 days.  Refill on Crestor  and Zetia  sent.  Orders: -     Rosuvastatin  Calcium ; Take 1 tablet (20 mg total) by mouth at bedtime.  Dispense: 90 tablet; Refill: 3 -     Aspirin  EC; Take 1 tablet (81  mg total) by mouth daily.  Dispense: 90 tablet; Refill: 3 -     Ezetimibe ; Take 1 tablet (10 mg total) by mouth daily.  Dispense: 90 tablet; Refill: 3 -     Microalbumin / creatinine urine ratio  Type 2 diabetes mellitus with other specified complication, without long-term current use of insulin  (HCC) Assessment & Plan: Most recent A1c 6.3 percent.  Repeat A1c today.  Check urine microalbumin level. Continue Farxiga  10 mg daily.  She is currently not taking glipizide .  Previously glipizide  was only prescribed for 5 days.  Continue low carbohydrate diet.    Obesity (BMI 30-39.9) Assessment & Plan: Counseled on regular exercise, eating healthy.  Check vitamin D   Orders: -     CBC with Differential/Platelet -     VITAMIN D  25 Hydroxy (Vit-D Deficiency, Fractures)  Eczema, unspecified type Assessment & Plan: Involving elbows.  Currently asymptomatic.  Okay to use hydrocortisone  1% cream twice daily as needed for 14 days in a row.  Potential side effect associated with prolonged use of steroid cream discussed with the patient.  Orders: -     Hydrocortisone ; Apply topically 2 (two) times daily.  Dispense: 30 g; Refill: 2  Medication management -     Saline Spray; Place 2 sprays into  both nostrils daily as needed for congestion.  Dispense: 30 mL; Refill: 11 -     Vitamin B12  Non-seasonal allergic rhinitis due to pollen Assessment & Plan: Use Nettie pot 2-3 times a day as needed, nasal Flonase  daily.  If symptoms not controlled with this regimen it is okay to try azelastine  as needed, OTC antihistamine daily.  Okay to use nasal saline spray for dry nose.   Type 2 diabetes mellitus with complications (HCC) Assessment & Plan: Most recent A1c 6.3 percent.  Repeat A1c today.  Check urine microalbumin level. Continue Farxiga  10 mg daily.  She is currently not taking glipizide .  Previously glipizide  was only prescribed for 5 days.  Continue low carbohydrate diet.   Orders: -     Farxiga ; Take 1 tablet (10 mg total) by mouth daily before breakfast.  Dispense: 30 tablet; Refill: 11 -     Hemoglobin A1c -     Comprehensive metabolic panel with GFR  Systolic CHF, chronic (HCC) Assessment & Plan: Established and managed by: Cardiology.   She is also prescribed an managed following by her cardiologist:  - Imdur  30 mg twice daily - Nitroglycerin  0.4 mg sublingual as needed, she has not needed this.  - K-Chlor 10 mEq daily - Rivaroxaban  2.5 mg twice daily, -  Lasix  20 mg daily  - Alirocumab  75 mg every 14 days - Last lipid panel checked on 12/31/2023 with LDL of 77   Orders: -     Farxiga ; Take 1 tablet (10 mg total) by mouth daily before breakfast.  Dispense: 30 tablet; Refill: 11  Overactive bladder Assessment & Plan: Symptoms stable with Gemtesa  75 mg once daily, continue.  She has been through pelvic floor therapy, has seen urology in the past.  If symptom worsens recommend reevaluation by urology.  Orders: -     Gemtesa ; Take 1 tablet (75 mg total) by mouth daily.  Dispense: 90 tablet; Refill: 3  Mood disorder (HCC) Assessment & Plan: Symptoms stable on paroxetine  20 mg daily with as needed hydroxyzine  25 mg for situational anxiety.  Continue we also discussed  tapering off of paroxetine  and potentially starting escitalopram to help reduce side effects related to medication.  Revisit during follow-up appointment.  Orders: -     hydrOXYzine  HCl; Take 1 tablet (25 mg total) by mouth daily as needed for anxiety. -     PARoxetine  HCl; Take 1 tablet (20 mg total) by mouth daily.  Dispense: 90 tablet; Refill: 3  Irritable bowel syndrome with both constipation and diarrhea Assessment & Plan: In the past patient has tried Linzess , dicyclomine  developed severe diarrhea on it.  Currently she is managing this symptomatically.  If symptom worsens I recommend referral to GI.  Discussed stress management to help reduce recurrence of symptoms.   Hypertension associated with diabetes (HCC) Assessment & Plan: Blood pressure within goal.    COPD with chronic bronchitis and emphysema (HCC) Assessment & Plan: With recent exacerbation for which patient was seen in the clinic by me on 03/17/2024.  She finished azithromycin .  Is continuing to take prednisone , doxycycline , DuoNeb, as needed albuterol  inhaler with significant improvement in cough.  Orders: -     EPINEPHrine ; Inject one pen into the thigh as needed for anaphylactic reaction. Then CALL 911. Use second pen if needed thereafter  Dispense: 2 each; Refill: 5 -     AeroChamber MV; Use as instructed  Dispense: 1 each; Refill: 0  I personally spent a total of 60 minutes in the care of the patient today including preparing to see the patient, getting/reviewing separately obtained history, performing a medically appropriate exam/evaluation, counseling and educating, placing orders, documenting clinical information in the EHR, independently interpreting results, and communicating results.   Return in about 4 months (around 07/23/2024) for Chronic .   Luke Shade, MD

## 2024-03-23 NOTE — Assessment & Plan Note (Addendum)
 Continue Crestor  20 mg, Zetia  10 mg, Alirocumab  75 mg every 14 days.  Refill on Crestor  and Zetia  sent.

## 2024-03-23 NOTE — Assessment & Plan Note (Addendum)
 Counseled on regular exercise, eating healthy.  Check vitamin D 

## 2024-03-25 ENCOUNTER — Encounter: Payer: Self-pay | Admitting: Internal Medicine

## 2024-03-25 ENCOUNTER — Ambulatory Visit: Payer: Self-pay

## 2024-03-25 ENCOUNTER — Ambulatory Visit (INDEPENDENT_AMBULATORY_CARE_PROVIDER_SITE_OTHER): Admitting: Internal Medicine

## 2024-03-25 VITALS — BP 110/80 | HR 76 | Temp 98.9°F | Ht 65.0 in | Wt 195.4 lb

## 2024-03-25 DIAGNOSIS — G4733 Obstructive sleep apnea (adult) (pediatric): Secondary | ICD-10-CM

## 2024-03-25 DIAGNOSIS — J4540 Moderate persistent asthma, uncomplicated: Secondary | ICD-10-CM | POA: Diagnosis not present

## 2024-03-25 MED ORDER — IPRATROPIUM-ALBUTEROL 0.5-2.5 (3) MG/3ML IN SOLN
3.0000 mL | Freq: Once | RESPIRATORY_TRACT | Status: AC
  Administered 2024-03-25: 3 mL via RESPIRATORY_TRACT

## 2024-03-25 NOTE — Progress Notes (Signed)
 @Patient  ID: [name], female    DOB: 27-Mar-1961, 63 y.o.   MRN: [id number]  SYNOPSIS 63 year old female, warmer smoker quit in 2012.  Past medical history significant for hypertension, heart failure, asthma/COPD, OSA on CPAP, allergic rhinitis, eczema, fatty liver, GERD, hyperlipidemia, type 2 diabetes, Vit D deficiency.HST 04/25/21 showed mild sleep apena, AHI 13.1/hour with SpO2 low 85%  EOS LEVELS 300 07/2023    CC Follow-up severe persistent asthma Follow-up assessment for OSA Follow-up recent asthma attack   HPI:  Follow-up assessment for severe persistent asthma Trelegy therapy at 200 Trelegy is working to keep her symptoms controlled Patient was treated for asthma exacerbation 1 week ago Was given antibiotics and prednisone  Patient continues prednisone  at this time will give DuoNeb in office today for wheezing  No significant respiratory distress at this time  IgE levels in November 2024 were elevated at 424  At this time patient is refusing biological agents  Patient is to follow-up with allergy immunology  Assessment of OSA Patient is intolerant of CPAP mask No plans to restart CPAP therapy      Allergies  Allergen Reactions   Citrullus Vulgaris Other (See Comments)    Throat itchy, watermelons   Latex Rash        Shellfish Allergy Anaphylaxis    Hard shellfish/swelling in throat   Sulfa Antibiotics Anaphylaxis    Swelling in throat.   Sulfonamide Derivatives Anaphylaxis    Swelling in throat.   Misc. Sulfonamide Containing Compounds    Penicillins Itching   Soy Allergy (Obsolete)     Diarrhea/upset stomach   Tomato Itching    Throat itchy - also Raw carrots   Other Swelling    Immunization History  Administered Date(s) Administered   Influenza Split 05/22/2015, 04/21/2016   Influenza, Quadrivalent, Recombinant, Inj, Pf 05/27/2022   Influenza,inj,Quad PF,6+ Mos 04/05/2018, 03/29/2019, 04/19/2020, 04/19/2021   Influenza-Unspecified  05/20/2017, 05/27/2022, 06/08/2023   PFIZER(Purple Top)SARS-COV-2 Vaccination 01/03/2020, 01/24/2020   PNEUMOCOCCAL CONJUGATE-20 06/08/2023   RSV,unspecified 05/27/2022   Respiratory Syncytial Virus Vaccine,Recomb Aduvanted(Arexvy) 05/27/2022   Tdap 05/05/2019    Past Medical History:  Diagnosis Date   Abnormal feces    Abnormality of both breasts on screening mammogram 05/14/2020   Acute esophagogastric ulcer    Allergy    Anemia    Annual physical exam 01/14/2021   Anxiety and depression 04/05/2018   Arthritis    Asthma    Atypical chest pain 01/20/2014   Back ache 06/19/2015   Bacterial vaginitis    Benign breast cyst in female, right 02/22/2020   Benign neoplasm of ascending colon    Bilateral hip pain 04/19/2020   Blood in stool    Brachial neuritis or radiculitis NOS    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 being followed by neurosurgery/eye doctor and pcp. monitoring size.causes sinus problems   Breast mass, right 01/14/2021   Bronchitis    Bunion of right foot 08/26/2018   Cardiac arrhythmia    CCF (congestive cardiac failure) (HCC) 06/19/2015   Cervical neck pain with evidence of disc disease    C5/6 disease, MRI done late 2012 - no records available   Chronic combined systolic and diastolic CHF (congestive heart failure) (HCC)    a. 08/2010 Echo: mildly reduced EF 40-45%, mild diffuse hypokinesis; b. 06/2016 Echo: EF 50%, no rwma, mild to mod TR; c. 03/2017 Echo: EF 40-45%, Gr1 DD (prior echo reviewed and EF felt to be lower than reported).   Chronic  neck pain 12/18/2017   Chronic pain    Chronic pain of inguinal region 07/24/2021   CLAUDICATION 08/23/2010   Qualifier: Diagnosis of   By: Claudene RN, Megan       Cocaine abuse, in remission (HCC)    clean x 24 years   Colon polyp 06/19/2015   2010   Colon polyps    COPD (chronic obstructive pulmonary disease) (HCC)    a. 12/2018 PFT: No obvious obst/restrictive dzs.   COPD exacerbation (HCC)  03/12/2022   Coronary artery disease    a. PCI of LCX 2003; b. PCI of the LAD 2012 with a (2.5 x 8 mm BMS);  c.s/p CABG 4/12: L-LAD, VG-Dx, VG-OM, VG-RCA (Dr. Fleeta Ochoa);  d. 01/2012 MV: inf infarct, attenuation, no ischemia; e. 02/2017 MV: signifi attenuation artifact. Fixed basal antlat/inflat scar vs artifact. Reversible apical lat and mid antlat defect - ? atten vs ischemia. F/u echo w/o wma->Med rx.   Coronary artery disease involving native coronary artery of native heart without angina pectoris 08/23/2010   Depression    Depression    Diabetes mellitus without complication [facility])    Fall 05/14/2018   Family history of colon cancer    Family history of colon cancer 06/19/2015   Gastritis without bleeding    Generalized headaches    frequent   GERD (gastroesophageal reflux disease)    Headache    Heart murmur    Hematochezia    Hematochezia 01/11/2015   Hepatitis    history of hepatitis b   History of cervical cancer    s/p cryotherapy   History of cervical cancer    s/p cryotherapy   History of colonic polyps 01/11/2015   History of drug abuse (HCC)    cocaine, marijuana, clean since 1989   History of drug abuse (HCC)    cocaine, marijuana, clean since 1989   History of hepatitis B    from eating undercooked liver   History of MI (myocardial infarction)    HTN (hypertension) 12/20/2011   Hyperlipidemia    Hypertension    Insomnia 04/19/2020   Intractable vomiting with nausea    Intractable vomiting with nausea    Ischemic cardiomyopathy    a. 03/2017 Echo: EF 40-45%, Gr1 DD.   Lipoma of right lower extremity 08/30/2020   Mild intermittent asthma 10/13/2019   Myocardial infarction [facility]) 2003, 2012   Need for pneumococcal 20-valent conjugate vaccination 07/06/2022   Numbness and tingling in right hand 02/24/2018   Orthostatic hypotension 04/30/2015   PAD (peripheral artery disease) (HCC)    a. s/p Right SFA atherectomy and PTA 01/15/11; b. 07/2018 ABI: R 1.02, L 1.09.    Pituitary mass (HCC)    a. 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 Mass abuts R cavernous sinus w/o definite invasion.   PND (post-nasal drip) 07/24/2021   Polyp of colon    Pruritus of both eyes 09/21/2020   Right knee pain 12/20/2011   Abnormal MRI right knee 08/29/2016 f/u [facility]      FINDINGS: Medial compartment: [There is abnormal linear signal within the medial meniscus near junction of posterior horn and mid body which abuts the tibial undersurface consistent with nondisplaced tear (5:8-10). The articular cartilage, and subchondral bone are normal. 3 mm bone island (7:14).  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   Seasonal allergies    Shortness of breath 08/23/2010   Qualifier: Diagnosis of  By: Claudene RN, Megan       Skin lesion 04/19/2021   Sleep apnea    uses cpap   Smoking history    quit 07/2010   Smoking history 12/16/2010   Quit 07/2010   Status post total knee replacement using cement, right 04/14/2019   Thrush 04/19/2021   Thyroid  disease    Urinary incontinence    Urinary incontinence 12/18/2017   Vaginal itching 04/19/2021   Vasomotor flushing 11/24/2016    Tobacco History: Social History   Tobacco Use  Smoking Status Former   Current packs/day: 0.00   Average packs/day: 2.0 packs/day for 38.0 years (76.0 ttl pk-yrs)   Types: Cigarettes   Start date: 08/12/1972   Quit date: 08/12/2010   Years since quitting: 13.6  Smokeless Tobacco Never   Counseling given: Not Answered   Outpatient Medications Prior to Visit  Medication Sig Dispense Refill   acetaminophen  (TYLENOL  8 HOUR) 650 MG CR tablet Take 2 tablets (1,300 mg total) by mouth 2 (two) times daily as needed for pain. 60 tablet 1   albuterol  (VENTOLIN  HFA) 108 (90 Base) MCG/ACT inhaler Inhale 2 puffs into the lungs every 6 (six) hours as needed for wheezing or shortness of breath. 1 g 12   Alirocumab  (PRALUENT ) 75 MG/ML SOAJ Inject 1 mL (75  mg total) into the skin every 14 (fourteen) days. 2 mL 12   aspirin  EC 81 MG tablet Take 1 tablet (81 mg total) by mouth daily. 90 tablet 3   Azelastine  HCl 0.15 % SOLN Place 2 sprays into both nostrils daily as needed (allergies). 30 mL 6   EPINEPHrine  0.3 mg/0.3 mL IJ SOAJ injection Inject one pen into the thigh as needed for anaphylactic reaction. Then CALL 911. Use second pen if needed thereafter 2 each 5   esomeprazole  (NEXIUM ) 40 MG capsule Take 1 capsule (40 mg total) by mouth daily. 90 capsule 3   ezetimibe  (ZETIA ) 10 MG tablet Take 1 tablet (10 mg total) by mouth daily. 90 tablet 3   FARXIGA  10 MG TABS tablet Take 1 tablet (10 mg total) by mouth daily before breakfast. 30 tablet 11   fluticasone  (FLONASE ) 50 MCG/ACT nasal spray Place 2 sprays into both nostrils daily. Prn nasal congestion 1 g 3   Fluticasone -Umeclidin-Vilant (TRELEGY ELLIPTA ) 200-62.5-25 MCG/ACT AEPB Inhale 1 Act into the lungs daily. 1 each 10   Fluticasone -Umeclidin-Vilant (TRELEGY ELLIPTA ) 200-62.5-25 MCG/ACT AEPB Inhale 1 puff into the lungs daily.     furosemide  (LASIX ) 20 MG tablet Take 1 tablet (20 mg total) by mouth daily AND 1 tablet (20 mg total) 3 (three) times a week at lunch. 126 tablet 0   GEMTESA  75 MG TABS Take 1 tablet (75 mg total) by mouth daily. 90 tablet 3   hydrocortisone  cream 1 % Apply topically 2 (two) times daily. 30 g 2   hydrOXYzine  (ATARAX ) 25 MG tablet Take 1 tablet (25 mg total) by mouth daily as needed for anxiety.     ipratropium-albuterol  (DUONEB) 0.5-2.5 (3) MG/3ML SOLN USE 1 VIAL IN NEBULIZER TWICE DAILY AS NEEDED FOR WHEEZING OR SHORTNESS OF BREATH 360 mL 0   isosorbide  mononitrate (IMDUR ) 30 MG 24 hr tablet Take 1 tablet (30 mg total) by mouth 2 (two) times daily. 180 tablet 3   metoprolol  succinate (TOPROL -XL) 50 MG 24 hr tablet TAKE WITH OR IMMEDIATELY FOLLOWING A MEAL 90 tablet 0   montelukast  (SINGULAIR ) 10 MG tablet Take 1 tablet (10 mg total) by mouth at bedtime. 90  tablet 3    nitroGLYCERIN  (NITROSTAT ) 0.4 MG SL tablet DISSOLVE ONE TABLET UNDER THE TONGUE EVERY 5 MINUTES AS NEEDED FOR CHEST PAIN.  DO NOT EXCEED A TOTAL OF 3 DOSES IN 15 MINUTES 25 tablet 3   olopatadine  (PATANOL) 0.1 % ophthalmic solution INSTILL 1 DROP INTO EACH EYE TWICE DAILY 10 mL 1   PARoxetine  (PAXIL ) 20 MG tablet Take 1 tablet (20 mg total) by mouth daily. 90 tablet 3   potassium chloride  (KLOR-CON  M) 10 MEQ tablet Take 1 tablet (10 mEq total) by mouth daily. 90 tablet 0   pregabalin  (LYRICA ) 75 MG capsule Take 1 capsule (75 mg total) by mouth 2 (two) times daily. 180 capsule 1   rivaroxaban  (XARELTO ) 2.5 MG TABS tablet Take 1 tablet (2.5 mg total) by mouth 2 (two) times daily. 180 tablet 1   rosuvastatin  (CRESTOR ) 20 MG tablet Take 1 tablet (20 mg total) by mouth at bedtime. 90 tablet 3   sodium chloride  (OCEAN) 0.65 % SOLN nasal spray Place 2 sprays into both nostrils daily as needed for congestion. 30 mL 11   Spacer/Aero-Holding Chambers (AEROCHAMBER MV) inhaler Use as instructed 1 each 0   No facility-administered medications prior to visit.   BP 110/80   Pulse 76   Temp 98.9 F (37.2 C)   Ht 5' 5 (1.651 m)   Wt 195 lb 6.4 oz (88.6 kg)   SpO2 98%   BMI 32.52 kg/m    Review of Systems: Gen:  Denies  fever, sweats, chills weight loss  HEENT: Denies blurred vision, double vision, ear pain, eye pain, hearing loss, nose bleeds, sore throat Cardiac:  No dizziness, chest pain or heaviness, chest tightness,edema, No JVD Resp:   No cough, -sputum production, +shortness of breath,+wheezing, -hemoptysis,  Other:  All other systems negative   Physical Examination:   General Appearance: No distress  EYES PERRLA, EOM intact.   NECK Supple, No JVD Pulmonary: normal breath sounds,+ wheezing.  CardiovascularNormal S1,S2.  No m/r/g.   Abdomen: Benign, Soft, non-tender. Neurology UE/LE 5/5 strength, no focal deficits Ext pulses intact, cap refill intact ALL OTHER ROS ARE NEGATIVE  Lab  Results:  CBC    Component Value Date/Time   WBC 7.1 03/23/2024 1354   RBC 4.80 03/23/2024 1354   HGB 15.1 (H) 03/23/2024 1354   HGB 12.8 02/13/2012 2319   HCT 46.3 (H) 03/23/2024 1354   HCT 38.3 02/13/2012 2319   PLT 169.0 03/23/2024 1354   PLT 144 (L) 02/13/2012 2319   MCV 96.5 03/23/2024 1354   MCV 94 02/13/2012 2319   MCH 31.6 01/15/2024 1058   MCHC 32.6 03/23/2024 1354   RDW 14.1 03/23/2024 1354   RDW 13.9 02/13/2012 2319   LYMPHSABS 2.7 03/23/2024 1354   MONOABS 0.5 03/23/2024 1354   EOSABS 0.1 03/23/2024 1354   BASOSABS 0.0 03/23/2024 1354    BMET    Component Value Date/Time   NA 143 03/23/2024 1354   NA 142 01/20/2014 1127   NA 141 02/13/2012 2319   K 3.8 03/23/2024 1354   K 3.6 02/13/2012 2319   CL 104 03/23/2024 1354   CL 106 02/13/2012 2319   CO2 26 03/23/2024 1354   CO2 27 02/13/2012 2319   GLUCOSE 108 (H) 03/23/2024 1354   GLUCOSE 92 02/13/2012 2319   BUN 21 03/23/2024 1354   BUN 16 01/20/2014 1127   BUN 16 02/13/2012 2319   CREATININE 1.12 03/23/2024 1354   CREATININE 1.01 (H) 08/21/2020 1014  CALCIUM  9.7 03/23/2024 1354   CALCIUM  8.7 02/13/2012 2319   GFRNONAA >60 01/15/2024 1058   GFRNONAA >60 02/13/2012 2319   GFRAA >60 08/25/2019 1018   GFRAA >60 02/13/2012 2319    BNP    Component Value Date/Time   BNP 1,292 (H) 02/13/2012 2319       Assessment & Plan:   63 year old African-American female seen today for follow-up assessment for asthma based on her symptoms with severely persistent has refused biological agents at this time has been assessed for immunological therapy in the setting of obstructive sleep apnea mild AHI 11  Assessment & Plan Moderate persistent reactive airway disease without complication Assessment of severe persistent asthma Will need to discuss biological therapy-patient refusing therapy at this time To give DuoNeb in office today Currently on Trelegy 200 Rinse mouth after use Avoid Allergens and  Irritants Avoid secondhand smoke Avoid SICK contacts Recommend  Masking  when appropriate Recommend Keep up-to-date with vaccinations Follow-up allergy immunology  OSA (obstructive sleep apnea) Mild OSA Patient intolerant of CPAP and mask Recommend weight loss    MEDICATION ADJUSTMENTS/LABS AND TESTS ORDERED: Trelegy to 200 once a day Please rinse mouth out after use Continue albuterol  as needed DuoNebs as needed Follow-up with allergy immunology Avoid Allergens and Irritants Avoid secondhand smoke Avoid SICK contacts Recommend  Masking  when appropriate Recommend Keep up-to-date with vaccinations Recommend weight loss   CURRENT MEDICATIONS REVIEWED AT LENGTH WITH PATIENT TODAY   Patient  satisfied with Plan of action and management. All questions answered   Follow up 6 months   I spent a total of 45 minutes dedicated to the care of this patient on the date of this encounter to include pre-visit review of records, face-to-face time with the patient discussing conditions above, post visit ordering of testing, clinical documentation with the electronic health record, making appropriate referrals as documented, and communicating necessary information to the patient's healthcare team.    The Patient requires high complexity decision making for assessment and support, frequent evaluation and titration of therapies, application of advanced monitoring technologies and extensive interpretation of multiple databases.  Patient satisfied with Plan of action and management. All questions answered    Nickolas Alm Cellar, M.D.  [facility]  Medical Director [facility] Medical Director [facility] Cardio-Pulmonary Department

## 2024-03-25 NOTE — Patient Instructions (Signed)
 Continue inhalers as prescribed Trelegy Rinse mouth with every use Use albuterol  as needed Use nebulizers as needed  Avoid Allergens and Irritants Avoid secondhand smoke Avoid SICK contacts Recommend  Masking  when appropriate Recommend Keep up-to-date with vaccinations

## 2024-04-01 ENCOUNTER — Other Ambulatory Visit: Payer: Self-pay | Admitting: Cardiovascular Disease

## 2024-04-01 DIAGNOSIS — R609 Edema, unspecified: Secondary | ICD-10-CM

## 2024-04-11 ENCOUNTER — Telehealth: Payer: Self-pay

## 2024-04-11 NOTE — Telephone Encounter (Signed)
 Copied from CRM [id number]. Topic: General - Other >> Apr 11, 2024  3:21 PM Whitney O wrote: Reason for [id number] is calling cause when she last seen  dr isaiah he had some cpap mask in the window and there is one that i like and patient need the name of the mask itself. Because I have to order new equipment and I got to change the mask and you said it was the last one in the window going towards where the desk and cabinet is at . Please reach out to patient concerning this mask  [phone]

## 2024-04-21 ENCOUNTER — Other Ambulatory Visit: Payer: Self-pay | Admitting: Cardiovascular Disease

## 2024-04-24 NOTE — Progress Notes (Unsigned)
 Cardiology Office Note  Date:  04/25/2024   ID:  [name], DOB [birth year], MRN [id number]  PCP:  Abbey Bruckner, MD   Chief Complaint  Patient presents with   6 month follow up     Patient c/o left arm twinges at times.     HPI:  63 yo woman with a  long smoking history,   CAD s/p  NSTEMI at [facility]  1/12, with BMS to pLAD (80% stenosis),  residual severe stenosis of the RCA/nondominant small vessel,  continued chest pain,  cardiac catheterization showing distal left main stenosis involving a high grade ostial LAD stenosis and circumflex disease approximately 90%, 90% diagonal disease, 80% nondominant RCA disease with ejection fraction 45%,  CABG x 4 in 2012 with a LIMA to the LAD, vein graft to the diagonal, vein graft to the marginal and vein graft to the RCA,   peripheral vascular disease,  S/p atherectomy and PTA of her right SFA on 01/15/11,   outpatient stress test  July 2013 , OSA who presents for routine followup of her coronary artery disease  LOV 6/24 In follow-up today she reports doing relatively well  Reports using Repatha  injection device, she had bleeding on her legs, she stopped the medication Has praluent , injected in her stomach, made stomach sore  Taking lasix  20 daily with extra 23 days a week Denies significant leg swelling Weight stable Occasional chest issues, less concerning for angina  No regular exercise program  Long history of chronic neck pain, shoulder pain, numbness into her hands  Echocardiogram September 2023 EF 45 to 50%, stable  Labs reviewed A1C 6.8 Total chol 145, LDL 77  EKG personally reviewed by myself on todays visit EKG Interpretation Date/Time:  Monday April 25 2024 14:19:36 EDT Ventricular Rate:  61 PR Interval:  106 QRS Duration:  86 QT Interval:  440 QTC Calculation: 442 R Axis:   -7  Text Interpretation: Sinus rhythm with short PR with Premature atrial complexes Nonspecific T wave abnormality When compared  with ECG of 25-Apr-2024 14:18, Premature atrial complexes are now Present QRS axis Shifted right Confirmed by Perla Lye [phone]) on 04/25/2024 6:17:40 PM   Prior  outpatient stress test  July 2013 showed significant breast attenuation artifact, inferior wall artifact from GI uptake. Overall no significant ischemia. History of chronic left arm and upper left chest  pain and was seen pain clinic. Prior diagnosis of carpal tunnel syndrome  occasional left breast pain radiating to her mediastinum.    Previously seen by ear nose throat for vestibular testing as a cause of her dizziness.  EGD and colonoscopy which by her report showed hiatal hernia. Prior problems with swallowing  She has had MRI of the neck showing cervical disc disease   Echocardiogram in the hospital showed normal LV systolic function ejection fraction 50-55%, mild MR, mild to moderate TR with right ventricular systolic pressures 30-40  PMH:   has a past medical history of Abnormal feces, Abnormality of both breasts on screening mammogram (05/14/2020), Acute esophagogastric ulcer, Allergy, Anemia, Annual physical exam (01/14/2021), Anxiety and depression (04/05/2018), Arthritis, Asthma, Atypical chest pain (01/20/2014), Back ache (06/19/2015), Bacterial vaginitis, Benign breast cyst in female, right (02/22/2020), Benign neoplasm of ascending colon, Bilateral hip pain (04/19/2020), Blood in stool, Brachial neuritis or radiculitis NOS, Brain tumor (benign) (HCC) (07/2017), Breast mass, right (01/14/2021), Bronchitis, Bunion of right foot (08/26/2018), Cardiac arrhythmia, CCF (congestive cardiac failure) (HCC) (06/19/2015), Cervical neck pain with evidence of disc disease, Chronic combined systolic and  diastolic CHF (congestive heart failure) (HCC), Chronic neck pain (12/18/2017), Chronic pain, Chronic pain of inguinal region (07/24/2021), CLAUDICATION (08/23/2010), Cocaine abuse, in remission [facility]), Colon polyp (06/19/2015), Colon polyps,  COPD (chronic obstructive pulmonary disease) (HCC), COPD exacerbation (HCC) (03/12/2022), Coronary artery disease, Coronary artery disease involving native coronary artery of native heart without angina pectoris (08/23/2010), Depression, Depression, Diabetes mellitus without complication (HCC), Fall (05/14/2018), Family history of colon cancer, Family history of colon cancer (06/19/2015), Gastritis without bleeding, Generalized headaches, GERD (gastroesophageal reflux disease), Headache, Heart murmur, Hematochezia, Hematochezia (01/11/2015), Hepatitis, History of cervical cancer, History of cervical cancer, History of colonic polyps (01/11/2015), History of drug abuse (HCC), History of drug abuse (HCC), History of hepatitis B, History of MI (myocardial infarction), HTN (hypertension) (12/20/2011), Hyperlipidemia, Hypertension, Insomnia (04/19/2020), Intractable vomiting with nausea, Intractable vomiting with nausea, Ischemic cardiomyopathy, Lipoma of right lower extremity (08/30/2020), Mild intermittent asthma (10/13/2019), Myocardial infarction (HCC) (2003, 2012), Need for pneumococcal 20-valent conjugate vaccination (07/06/2022), Numbness and tingling in right hand (02/24/2018), Orthostatic hypotension (04/30/2015), PAD (peripheral artery disease), Pituitary mass, PND (post-nasal drip) (07/24/2021), Polyp of colon, Pruritus of both eyes (09/21/2020), Right knee pain (12/20/2011), 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  PSH:    Past Surgical History:  Procedure Laterality Date   ABDOMINAL HYSTERECTOMY     2003   CHOLECYSTECTOMY  1986   COLONOSCOPY  2008    3 polyps   COLONOSCOPY WITH PROPOFOL  N/A 02/06/2015   Procedure: COLONOSCOPY WITH PROPOFOL ;  Surgeon: Rogelia Copping, MD;  Location: [facility] ENDOSCOPY;  Service: Endoscopy;  Laterality: N/A;   COLONOSCOPY WITH PROPOFOL  N/A 01/27/2017   Procedure: COLONOSCOPY WITH PROPOFOL ;  Surgeon: Copping Rogelia, MD;  Location: [facility] ENDOSCOPY;  Service: Endoscopy;  Laterality: N/A;   COLONOSCOPY WITH PROPOFOL  N/A 07/15/2022   Procedure: COLONOSCOPY WITH PROPOFOL ;  Surgeon: Unk Corinn Skiff, MD;  Location: [facility] ENDOSCOPY;  Service: Gastroenterology;  Laterality: N/A;   CORONARY ANGIOPLASTY     w/ stent placement x2   CORONARY ARTERY BYPASS GRAFT  2012   Dr Perla   CORONARY STENT PLACEMENT  2003   S/P MI   CORONARY STENT PLACEMENT  2007   [place]   ESOPHAGOGASTRODUODENOSCOPY (EGD) WITH PROPOFOL  N/A 02/06/2015   Procedure: ESOPHAGOGASTRODUODENOSCOPY (EGD) WITH PROPOFOL ;  Surgeon: Rogelia Copping, MD;  Location: [facility] ENDOSCOPY;  Service: Endoscopy;  Laterality: N/A;   ESOPHAGOGASTRODUODENOSCOPY (EGD) WITH PROPOFOL  N/A 01/27/2017   Procedure: ESOPHAGOGASTRODUODENOSCOPY (EGD) WITH PROPOFOL ;  Surgeon: Copping Rogelia, MD;  Location: [facility] ENDOSCOPY;  Service: Endoscopy;  Laterality: N/A;   ESOPHAGOGASTRODUODENOSCOPY (EGD) WITH PROPOFOL  N/A 09/01/2019   Procedure: ESOPHAGOGASTRODUODENOSCOPY (EGD) WITH PROPOFOL ;  Surgeon: Unk Corinn Skiff, MD;  Location: [facility] ENDOSCOPY;  Service: Gastroenterology;  Laterality: N/A;   FEMORAL ARTERY STENT  10/2010   right sided (Dr. Wonda)   KNEE ARTHROSCOPY WITH MEDIAL MENISECTOMY Right 08/20/2017   Procedure: KNEE ARTHROSCOPY WITH MEDIAL  AND LATERAL MENISECTOMY;  Surgeon: Kathlynn Sharper, MD;  Location: [facility] ORS;  Service: Orthopedics;  Laterality: Right;   POSTERIOR CERVICAL LAMINECTOMY N/A 03/08/2018   Procedure: POSTERIOR CERVICAL LAMINECTOMY-C7;  Surgeon: Bluford Standing, MD;  Location: [facility] ORS;  Service: Neurosurgery;  Laterality: N/A;   TONSILLECTOMY     TOTAL KNEE ARTHROPLASTY Right  04/14/2019   Procedure: RIGHT TOTAL KNEE ARTHROPLASTY;  Surgeon: Kathlynn Sharper,  MD;  Location: [facility] ORS;  Service: Orthopedics;  Laterality: Right;   TUBAL LIGATION      Current Outpatient Medications  Medication Sig Dispense Refill   acetaminophen  (TYLENOL  8 HOUR) 650 MG CR tablet Take 2 tablets (1,300 mg total) by mouth 2 (two) times daily as needed for pain. 60 tablet 1   albuterol  (VENTOLIN  HFA) 108 (90 Base) MCG/ACT inhaler Inhale 2 puffs into the lungs every 6 (six) hours as needed for wheezing or shortness of breath. 1 g 12   Alirocumab  (PRALUENT ) 75 MG/ML SOAJ Inject 1 mL (75 mg total) into the skin every 14 (fourteen) days. 2 mL 12   aspirin  EC 81 MG tablet Take 1 tablet (81 mg total) by mouth daily. 90 tablet 3   Azelastine  HCl 0.15 % SOLN Place 2 sprays into both nostrils daily as needed (allergies). 30 mL 6   EPINEPHrine  0.3 mg/0.3 mL IJ SOAJ injection Inject one pen into the thigh as needed for anaphylactic reaction. Then CALL 911. Use second pen if needed thereafter 2 each 5   esomeprazole  (NEXIUM ) 40 MG capsule Take 1 capsule (40 mg total) by mouth daily. 90 capsule 3   ezetimibe  (ZETIA ) 10 MG tablet Take 1 tablet (10 mg total) by mouth daily. 90 tablet 3   FARXIGA  10 MG TABS tablet Take 1 tablet (10 mg total) by mouth daily before breakfast. 30 tablet 11   fluticasone  (FLONASE ) 50 MCG/ACT nasal spray Place 2 sprays into both nostrils daily. Prn nasal congestion 1 g 3   Fluticasone -Umeclidin-Vilant (TRELEGY ELLIPTA ) 200-62.5-25 MCG/ACT AEPB Inhale 1 Act into the lungs daily. 1 each 10   Fluticasone -Umeclidin-Vilant (TRELEGY ELLIPTA ) 200-62.5-25 MCG/ACT AEPB Inhale 1 puff into the lungs daily.     furosemide  (LASIX ) 20 MG tablet TAKE 1 TABLET BY MOUTH DAILY AND 1 TABLET AT LUNCH 3 TIMES A WEEK 120 tablet 1   GEMTESA  75 MG TABS Take 1 tablet (75 mg total) by mouth daily. 90 tablet 3   hydrocortisone  cream 1 % Apply topically 2 (two) times daily. 30 g 2   hydrOXYzine  (ATARAX ) 25 MG  tablet Take 1 tablet (25 mg total) by mouth daily as needed for anxiety.     ipratropium-albuterol  (DUONEB) 0.5-2.5 (3) MG/3ML SOLN USE 1 VIAL IN NEBULIZER TWICE DAILY AS NEEDED FOR WHEEZING OR SHORTNESS OF BREATH 360 mL 0   isosorbide  mononitrate (IMDUR ) 30 MG 24 hr tablet Take 1 tablet (30 mg total) by mouth 2 (two) times daily. 180 tablet 3   metoprolol  succinate (TOPROL -XL) 50 MG 24 hr tablet TAKE WITH OR IMMEDIATELY FOLLOWING A MEAL.  KEEP YOUR UPCOMING OFFICE VISIT ON 03/07/24 FOR FUTURE REFILL 90 tablet 0   montelukast  (SINGULAIR ) 10 MG tablet Take 1 tablet (10 mg total) by mouth at bedtime. 90 tablet 3   nitroGLYCERIN  (NITROSTAT ) 0.4 MG SL tablet DISSOLVE ONE TABLET UNDER THE TONGUE EVERY 5 MINUTES AS NEEDED FOR CHEST PAIN.  DO NOT EXCEED A TOTAL OF 3 DOSES IN 15 MINUTES 25 tablet 3   PARoxetine  (PAXIL ) 20 MG tablet Take 1 tablet (20 mg total) by mouth daily. 90 tablet 3   potassium chloride  (KLOR-CON  M) 10 MEQ tablet Take 1 tablet (10 mEq total) by mouth daily. 90 tablet 0   pregabalin  (LYRICA ) 75 MG capsule Take 1 capsule (75 mg total) by mouth 2 (two) times daily. 180 capsule 1   rivaroxaban  (XARELTO ) 2.5 MG TABS tablet Take 1 tablet (2.5 mg total) by mouth 2 (two) times daily. 180  tablet 1   rosuvastatin  (CRESTOR ) 20 MG tablet Take 1 tablet (20 mg total) by mouth at bedtime. 90 tablet 3   sodium chloride  (OCEAN) 0.65 % SOLN nasal spray Place 2 sprays into both nostrils daily as needed for congestion. 30 mL 11   Spacer/Aero-Holding Chambers (AEROCHAMBER MV) inhaler Use as instructed 1 each 0   olopatadine  (PATANOL) 0.1 % ophthalmic solution INSTILL 1 DROP INTO EACH EYE TWICE DAILY 10 mL 1   No current facility-administered medications for this visit.    Allergies:   Citrullus vulgaris, Latex, Shellfish allergy, Sulfa antibiotics, Sulfonamide derivatives, Misc. sulfonamide containing compounds, Penicillins, Soy allergy (obsolete), Tomato, and Other   Social History:  The patient  reports  that she quit smoking about 13 years ago. Her smoking use included cigarettes. She started smoking about 51 years ago. She has a 76 pack-year smoking history. She has never used smokeless tobacco. She reports that she does not drink alcohol and does not use drugs.   Family History:   family history includes Acute myelogenous leukemia in her grandson; Arthritis in her sister; Brain cancer in her sister; Breast cancer (age of onset: 64) in her mother; Cancer in her father and mother; Colon cancer (age of onset: 41) in her father; Diabetes in her brother, father, and sister; Glaucoma in her father; Heart disease in her father; Heart failure in her father; Hypertension in her brother, father, and sister; Kidney disease in her sister; Other in her father and mother.   Review of Systems: Review of Systems  Constitutional: Negative.   HENT: Negative.    Respiratory: Negative.    Cardiovascular: Negative.   Musculoskeletal:  Positive for back pain and joint pain.  Neurological: Negative.   Psychiatric/Behavioral: Negative.    All other systems reviewed and are negative.   PHYSICAL EXAM: BP 120/68 (BP Location: Left Arm, Patient Position: Sitting, Cuff Size: Normal)   Pulse 61   Ht 5' 5 (1.651 m)   Wt 204 lb (92.5 kg)   SpO2 97%   BMI 33.95 kg/m  Constitutional:  oriented to person, place, and time. No distress.  HENT:  Head: Grossly normal Eyes:  no discharge. No scleral icterus.  Neck: No JVD, no carotid bruits  Cardiovascular: Regular rate and rhythm, no murmurs appreciated Pulmonary/Chest: Clear to auscultation bilaterally, no wheezes or rails Abdominal: Soft.  no distension.  no tenderness.  Musculoskeletal: Normal range of motion Neurological:  normal muscle tone. Coordination normal. No atrophy Skin: Skin warm and dry Psychiatric: normal affect, pleasant  Recent Labs: 03/23/2024: ALT 46; BUN 21; Creatinine, Ser 1.12; Hemoglobin 15.1; Platelets 169.0; Potassium 3.8; Sodium 143     Lipid Panel Lab Results  Component Value Date   CHOL 145 12/31/2023   HDL 48.00 12/31/2023   LDLCALC 77 12/31/2023   TRIG 99.0 12/31/2023      Wt Readings from Last 3 Encounters:  04/25/24 204 lb (92.5 kg)  03/25/24 195 lb 6.4 oz (88.6 kg)  03/23/24 193 lb 12.8 oz (87.9 kg)     ASSESSMENT AND PLAN:   CAD/ CABG: With stable angina Currently with no symptoms of angina. No further workup at this time. Continue current medication regimen. Stable EF 45 to 50%  Ischemic cardiomyopathy Echocardiogram 45 to 50%, stable Continue Farxiga  10 daily, Imdur  30 twice daily, metoprolol  succinate 50 daily Previous attempts at medication titration limited secondary to low blood pressure  Hyperlipidemia Cholesterol is at goal on the current lipid regimen. No changes to the medications were  made.  Essential hypertension Blood pressure is well controlled on today's visit. No changes made to the medications.  OSA on CPAP  On CPAP  COPD   stopped smoking several years ago No recent COPD exacerbation  Chronic pain Chest, legs, back, arms Management per primary care  Leg swelling Stable on Lasix  20 daily with extra 23 days a week Continue potassium supplement  Diabetes type 2 with complications We have encouraged continued exercise, careful diet management    Orders Placed This Encounter  Procedures   EKG 12-Lead   EKG 12-Lead     Signed, Velinda Lunger, M.D., Ph.D. 04/25/2024  [facility] [place], [place] [phone]

## 2024-04-25 ENCOUNTER — Ambulatory Visit: Attending: Cardiovascular Disease | Admitting: Cardiovascular Disease

## 2024-04-25 ENCOUNTER — Encounter: Payer: Self-pay | Admitting: Cardiovascular Disease

## 2024-04-25 VITALS — BP 120/68 | HR 61 | Ht 65.0 in | Wt 204.0 lb

## 2024-04-25 DIAGNOSIS — E1169 Type 2 diabetes mellitus with other specified complication: Secondary | ICD-10-CM | POA: Diagnosis not present

## 2024-04-25 DIAGNOSIS — G4710 Hypersomnia, unspecified: Secondary | ICD-10-CM | POA: Diagnosis not present

## 2024-04-25 DIAGNOSIS — I5022 Chronic systolic (congestive) heart failure: Secondary | ICD-10-CM | POA: Diagnosis not present

## 2024-04-25 DIAGNOSIS — I10 Essential (primary) hypertension: Secondary | ICD-10-CM

## 2024-04-25 DIAGNOSIS — G4733 Obstructive sleep apnea (adult) (pediatric): Secondary | ICD-10-CM | POA: Diagnosis not present

## 2024-04-25 DIAGNOSIS — I255 Ischemic cardiomyopathy: Secondary | ICD-10-CM | POA: Diagnosis not present

## 2024-04-25 DIAGNOSIS — I4891 Unspecified atrial fibrillation: Secondary | ICD-10-CM

## 2024-04-25 DIAGNOSIS — I779 Disorder of arteries and arterioles, unspecified: Secondary | ICD-10-CM

## 2024-04-25 DIAGNOSIS — I25118 Atherosclerotic heart disease of native coronary artery with other forms of angina pectoris: Secondary | ICD-10-CM | POA: Diagnosis not present

## 2024-04-25 DIAGNOSIS — I700 Atherosclerosis of aorta: Secondary | ICD-10-CM

## 2024-04-25 DIAGNOSIS — I739 Peripheral vascular disease, unspecified: Secondary | ICD-10-CM | POA: Diagnosis not present

## 2024-04-25 DIAGNOSIS — E785 Hyperlipidemia, unspecified: Secondary | ICD-10-CM | POA: Diagnosis not present

## 2024-04-25 DIAGNOSIS — R609 Edema, unspecified: Secondary | ICD-10-CM | POA: Diagnosis not present

## 2024-04-25 DIAGNOSIS — I259 Chronic ischemic heart disease, unspecified: Secondary | ICD-10-CM | POA: Diagnosis not present

## 2024-04-25 MED ORDER — METOPROLOL SUCCINATE ER 50 MG PO TB24: 50.0000 mg | ORAL_TABLET | Freq: Every day | ORAL | 3 refills | Status: AC

## 2024-04-25 MED ORDER — RIVAROXABAN 2.5 MG PO TABS: 2.5000 mg | ORAL_TABLET | Freq: Two times a day (BID) | ORAL | 1 refills | Status: DC

## 2024-04-25 MED ORDER — ISOSORBIDE MONONITRATE ER 30 MG PO TB24: 30.0000 mg | ORAL_TABLET | Freq: Two times a day (BID) | ORAL | 3 refills | Status: AC

## 2024-04-25 MED ORDER — POTASSIUM CHLORIDE CRYS ER 10 MEQ PO TBCR: 10.0000 meq | EXTENDED_RELEASE_TABLET | Freq: Every day | ORAL | 3 refills | Status: AC

## 2024-04-25 NOTE — Patient Instructions (Addendum)
 Medication Instructions:   Your physician recommends that you continue on your current medications as directed. Please refer to the Current Medication list given to you today.   *If you need a refill on your cardiac medications before your next appointment, please call your pharmacy*  Lab Work: No labs ordered today  If you have labs (blood work) drawn today and your tests are completely normal, you will receive your results only by: MyChart Message (if you have MyChart) OR A paper copy in the mail If you have any lab test that is abnormal or we need to change your treatment, we will call you to review the results.  Testing/Procedures: No test ordered today   Follow-Up: At [facility], you and your health needs are our priority.  As part of our continuing mission to provide you with exceptional heart care, our providers are all part of one team.  This team includes your primary Cardiologist (physician) and Advanced Practice Providers or APPs (Physician Assistants and Nurse Practitioners) who all work together to provide you with the care you need, when you need it.  Your next appointment:   12 month(s)  Provider:   You may see Timothy [name], MD or one of the following Advanced Practice Providers on your designated Care Team:   Lonni Meager, NP Lesley Maffucci, PA-C Bernardino Bring, PA-C Cadence Benton, PA-C Tylene Lunch, NP Barnie Hila, NP    We recommend signing up for the patient portal called MyChart.  Sign up information is provided on this After Visit Summary.  MyChart is used to connect with patients for Virtual Visits (Telemedicine).  Patients are able to view lab/test results, encounter notes, upcoming appointments, etc.  Non-urgent messages can be sent to your provider as well.   To learn more about what you can do with MyChart, go to [email or web address].

## 2024-04-25 NOTE — Progress Notes (Signed)
 [name]                                          MRN: [id number]   04/25/2024   The VBCI Quality Team Specialist reviewed this patient medical record for the purposes of chart review for care gap closure. The following were reviewed: abstraction for care gap closure-kidney health evaluation for diabetes:eGFR  and uACR.    VBCI Quality Team

## 2024-05-10 ENCOUNTER — Other Ambulatory Visit: Payer: Self-pay

## 2024-05-10 DIAGNOSIS — J4541 Moderate persistent asthma with (acute) exacerbation: Secondary | ICD-10-CM

## 2024-06-09 DIAGNOSIS — H40003 Preglaucoma, unspecified, bilateral: Secondary | ICD-10-CM | POA: Diagnosis not present

## 2024-06-10 DIAGNOSIS — G4710 Hypersomnia, unspecified: Secondary | ICD-10-CM | POA: Diagnosis not present

## 2024-06-10 DIAGNOSIS — I259 Chronic ischemic heart disease, unspecified: Secondary | ICD-10-CM | POA: Diagnosis not present

## 2024-06-14 ENCOUNTER — Other Ambulatory Visit: Payer: Self-pay

## 2024-06-14 DIAGNOSIS — J4550 Severe persistent asthma, uncomplicated: Secondary | ICD-10-CM

## 2024-06-14 MED ORDER — TRELEGY ELLIPTA 200-62.5-25 MCG/ACT IN AEPB: 1.0000 | INHALATION_SPRAY | Freq: Every day | RESPIRATORY_TRACT | 3 refills | Status: AC

## 2024-06-15 DIAGNOSIS — E119 Type 2 diabetes mellitus without complications: Secondary | ICD-10-CM | POA: Diagnosis not present

## 2024-06-15 DIAGNOSIS — H2513 Age-related nuclear cataract, bilateral: Secondary | ICD-10-CM | POA: Diagnosis not present

## 2024-06-15 DIAGNOSIS — D352 Benign neoplasm of pituitary gland: Secondary | ICD-10-CM | POA: Diagnosis not present

## 2024-06-15 DIAGNOSIS — M3501 Sicca syndrome with keratoconjunctivitis: Secondary | ICD-10-CM | POA: Diagnosis not present

## 2024-06-15 LAB — OPHTHALMOLOGY REPORT-SCANNED

## 2024-06-30 ENCOUNTER — Telehealth: Payer: Self-pay

## 2024-06-30 NOTE — Telephone Encounter (Signed)
 Spoke with patient to make her aware that we have not received any fax regarding her incontinence supplies. Patient states they have been sending the fax under Dr Hope. Ask the patient to ask the company to refax it under Dr Graylon name. Patient verbalized understanding and has no further questions at this time.

## 2024-06-30 NOTE — Telephone Encounter (Signed)
 Copied from CRM #[id number]. Topic: Clinical - Order For Equipment >> Jun 30, 2024 12:05 PM Viola F wrote: Reason for CRM: Patient called to follow up on fax that was sent from [facility] a couple of weeks ago to fill her incontinence supplies. Please call patient with an update at [phone] (M)

## 2024-07-05 ENCOUNTER — Telehealth: Payer: Self-pay

## 2024-07-05 NOTE — Telephone Encounter (Unsigned)
 Copied from CRM #[id number]. Topic: Clinical - Order For Equipment >> Jun 30, 2024 12:05 PM Viola F wrote: Reason for CRM: Patient called to follow up on fax that was sent from [facility] a couple of weeks ago to fill her incontinence supplies. Please call patient with an update at [phone] (M) >> Jul 05, 2024 12:07 PM Frederich PARAS wrote: pt called last week for a paper t be signed to get incontincet supplies, pt needs it done asap. SABRA Pt only has s4 days left until she lose out on it. She needs this signed asap.

## 2024-07-05 NOTE — Telephone Encounter (Signed)
 Called and spoke with representative from [facility] to inform them that we have not received any fax in regards to the incontinence supplies. Provided the representative with my work email and asked to retry re-faxing to our office again at [phone]. Representative verbalized understanding. Patient will be notified.

## 2024-07-05 NOTE — Telephone Encounter (Signed)
 Received documentation from [facility] for incontinence order. Requesting large pull on/disposable underwear, disposable chux, non-sterile gloves.   Patient with h/o DM II (E11.9), vaginal atrophy, neuropathy. Incontinence order signed and will be faxed.    Luke Shade, MD

## 2024-07-11 ENCOUNTER — Other Ambulatory Visit: Payer: Self-pay

## 2024-07-11 DIAGNOSIS — I779 Disorder of arteries and arterioles, unspecified: Secondary | ICD-10-CM

## 2024-07-11 MED ORDER — RIVAROXABAN 2.5 MG PO TABS: 2.5000 mg | ORAL_TABLET | Freq: Two times a day (BID) | ORAL | 1 refills | Status: AC

## 2024-08-01 ENCOUNTER — Ambulatory Visit

## 2024-08-01 VITALS — BP 118/72 | HR 56 | Temp 97.9°F | Ht 65.0 in | Wt 200.6 lb

## 2024-08-01 DIAGNOSIS — H9313 Tinnitus, bilateral: Secondary | ICD-10-CM | POA: Diagnosis not present

## 2024-08-01 DIAGNOSIS — D352 Benign neoplasm of pituitary gland: Secondary | ICD-10-CM

## 2024-08-01 DIAGNOSIS — E785 Hyperlipidemia, unspecified: Secondary | ICD-10-CM

## 2024-08-01 DIAGNOSIS — R42 Dizziness and giddiness: Secondary | ICD-10-CM | POA: Diagnosis not present

## 2024-08-01 DIAGNOSIS — F39 Unspecified mood [affective] disorder: Secondary | ICD-10-CM

## 2024-08-01 DIAGNOSIS — G629 Polyneuropathy, unspecified: Secondary | ICD-10-CM | POA: Diagnosis not present

## 2024-08-01 DIAGNOSIS — N3281 Overactive bladder: Secondary | ICD-10-CM | POA: Diagnosis not present

## 2024-08-01 DIAGNOSIS — J4550 Severe persistent asthma, uncomplicated: Secondary | ICD-10-CM | POA: Insufficient documentation

## 2024-08-01 DIAGNOSIS — H579 Unspecified disorder of eye and adnexa: Secondary | ICD-10-CM | POA: Diagnosis not present

## 2024-08-01 DIAGNOSIS — N1831 Chronic kidney disease, stage 3a: Secondary | ICD-10-CM

## 2024-08-01 DIAGNOSIS — I5022 Chronic systolic (congestive) heart failure: Secondary | ICD-10-CM

## 2024-08-01 DIAGNOSIS — Z87891 Personal history of nicotine dependence: Secondary | ICD-10-CM | POA: Diagnosis not present

## 2024-08-01 DIAGNOSIS — I4891 Unspecified atrial fibrillation: Secondary | ICD-10-CM | POA: Insufficient documentation

## 2024-08-01 DIAGNOSIS — E1169 Type 2 diabetes mellitus with other specified complication: Secondary | ICD-10-CM | POA: Diagnosis not present

## 2024-08-01 MED ORDER — HYDROXYZINE HCL 25 MG PO TABS: 25.0000 mg | ORAL_TABLET | Freq: Every day | ORAL | 0 refills | Status: DC | PRN

## 2024-08-01 MED ORDER — ALBUTEROL SULFATE HFA 108 (90 BASE) MCG/ACT IN AERS: 2.0000 | INHALATION_SPRAY | Freq: Four times a day (QID) | RESPIRATORY_TRACT | 12 refills | Status: AC | PRN

## 2024-08-01 MED ORDER — PREGABALIN 75 MG PO CAPS: 75.0000 mg | ORAL_CAPSULE | Freq: Two times a day (BID) | ORAL | 3 refills | Status: AC

## 2024-08-01 NOTE — Assessment & Plan Note (Addendum)
 Continue close follow-up with [name]. Last eye exam on 05/2024. 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 No acute intracranial abnormality seen.  No new concerns.

## 2024-08-01 NOTE — Assessment & Plan Note (Addendum)
 Avoid antiinflammatory medications. Continue f/u with Dr. Douglas

## 2024-08-01 NOTE — Assessment & Plan Note (Addendum)
 Reviewed UA from 07/26/24, reassuring. Chronic overactive bladder with urgency and frequency.  Continue Gemtesa . Provided Kegel exercise instructions. Referred to urology for further evaluation. Previously also did pelvic floor therapy.  Orders:   Ambulatory referral to Urology

## 2024-08-01 NOTE — Assessment & Plan Note (Addendum)
 Chronic vertigo with tinnitus. Hydroxyzine  used with caution due to sedation risk. Referred to ENT for evaluation into tinnitus, reduced hearing for about 3 months.  Referred to vestibular therapy. Prescribed hydroxyzine  25 mg as needed daily with precaution. Recommend stopping medication if dizziness, drowsiness, urinary retention.  Orders:   hydrOXYzine  (ATARAX ) 25 MG tablet; Take 1 tablet (25 mg total) by mouth daily as needed for anxiety.   Ambulatory referral to Physical Therapy   Ambulatory referral to ENT

## 2024-08-01 NOTE — Assessment & Plan Note (Addendum)
 Continue follow up with cardiology. On Rivaroxaban  2.5 mg BID, no new concerns.

## 2024-08-01 NOTE — Assessment & Plan Note (Addendum)
 Pulmonology referral for screening for lung cancer for low dose CT done.  Orders:   Ambulatory Referral for Lung Cancer Scre

## 2024-08-01 NOTE — Assessment & Plan Note (Addendum)
 Plan per vertigo.  Orders:   Ambulatory referral to ENT

## 2024-08-01 NOTE — Patient Instructions (Addendum)
 Increase Kegel exercise, I have added information on after visit summary for you for this.   Please call the pharmacy to see if they have refill on Metoprolol , hydroxyzine  as we have refill for these medications and they should be able to fill it.   I am sending a new refill on Hydroxyzine  25 mg, please take this one tablet daily as needed. If you develop dizziness, drowsiness please stop taking it. I am putting a physical therapy referral for: Vestibular therapy for vertigo. I am also referring you to ENT doctor to look into ringing in ears reduced hearing. Please anticipate call within couple of weeks to schedule an appointment.   I am referring you to pulmonology referral for low dose CT scan of lungs to screen for lung cancer.    Let's follow up in about 5-6 months or sooner if you need me.

## 2024-08-01 NOTE — Assessment & Plan Note (Addendum)
 Secondary to allergen.  Currently asymptomatic. Continue prn Pataday  0.2% once daily which she gets this over the counter.

## 2024-08-01 NOTE — Progress Notes (Signed)
 "  Established Patient Office Visit   Subjective  Patient ID: [name], female    DOB: September 30, 1960  Age: 64 y.o. MRN: [id number]  Chief Complaint  Patient presents with   Medical Management of Chronic Issues    Four month follow-up     Discussed the use of AI scribe software for clinical note transcription with the patient, who gave verbal consent to proceed.  History of Present Illness [name] is a 64 year old female with kidney disease and atrial fibrillation who presents with vertigo and medication management issues.   She experiences vertigo, accompanied by tinnitus, causing dizziness and imbalance, especially with head movements. She has not been taking hydroxyzine  due to prescription issues. She has not consulted an ENT specialist or undergone vestibular therapy for this condition. She is interested in vestibular therapy and seeing ENT. She also endorses reduced hearing bilaterally for about 3 months.   She recently saw Dr. Douglas for CKD and plans on following up with him. She is on Farxiga  10 mg daily, avoids nephrotoxic medications.   Has a h/o moderate persistent asthma, history of smoking. She has been following up with pulmonologist Dr. Isaiah. She is interested in low dose CT lungs for lung cancer screening.  She requests refill on Albuterol  inhaler. She also uses Trelegy, on Montelukast  10 mg daily, uses Azelastine  and Flonase  nasally to help with allergy symptoms.   She experiences bladder spasm like sensation and has a h/o overactive bladder. She is on Gemtesa  75 mg daily. No dysuria, but she reports pelvic pain and a sensation of tugging. Her history includes pelvic floor issues and arthritis, contributing to her discomfort. She has done pelvic floor therapy in the past. She does not recall seeing urologist recently.    She has a history of sensation of food to get stuck, leading to discomfort and dysphagia. She is seeing GI specialist Dr. Unk on 08/02/24.       ROS As per HPI    Objective:     BP 118/72   Pulse (!) 56   Temp 97.9 F (36.6 C)   Ht 5' 5 (1.651 m)   Wt 200 lb 9.6 oz (91 kg)   SpO2 96%   BMI 33.38 kg/m      08/01/2024    8:47 AM 03/23/2024    1:11 PM 03/17/2024    8:12 AM  Depression screen PHQ 2/9  Decreased Interest 0 0 2  Down, Depressed, Hopeless 0 0 0  PHQ - 2 Score 0 0 2  Altered sleeping 2 1 3   Tired, decreased energy 1 0 1  Change in appetite 0 0 0  Feeling bad or failure about yourself  0 0 0  Trouble concentrating 1 0 0  Moving slowly or fidgety/restless 0 0 0  Suicidal thoughts 0 0 0  PHQ-9 Score 4 1  6    Difficult doing work/chores Not difficult at all Not difficult at all Not difficult at all     Data saved with a previous flowsheet row definition      08/01/2024    8:47 AM 03/23/2024    1:11 PM 03/17/2024    8:12 AM 12/31/2023   10:10 AM  GAD 7 : Generalized Anxiety Score  Nervous, Anxious, on Edge 0 0 0 0  Control/stop worrying 0 0 0 0  Worry too much - different things 1 0 0 0  Trouble relaxing 0 0 0 0  Restless 0 0 0 0  Easily annoyed  or irritable 0 0 0 1  Afraid - awful might happen 0 0 0 0  Total GAD 7 Score 1 0 0 1  Anxiety Difficulty Not difficult at all Not difficult at all Not difficult at all Not difficult at all      08/01/2024    8:47 AM 03/23/2024    1:11 PM 03/17/2024    8:12 AM  Depression screen PHQ 2/9  Decreased Interest 0 0 2  Down, Depressed, Hopeless 0 0 0  PHQ - 2 Score 0 0 2  Altered sleeping 2 1 3   Tired, decreased energy 1 0 1  Change in appetite 0 0 0  Feeling bad or failure about yourself  0 0 0  Trouble concentrating 1 0 0  Moving slowly or fidgety/restless 0 0 0  Suicidal thoughts 0 0 0  PHQ-9 Score 4 1  6    Difficult doing work/chores Not difficult at all Not difficult at all Not difficult at all     Data saved with a previous flowsheet row definition      08/01/2024    8:47 AM 03/23/2024    1:11 PM 03/17/2024    8:12 AM 12/31/2023   10:10 AM   GAD 7 : Generalized Anxiety Score  Nervous, Anxious, on Edge 0 0 0 0  Control/stop worrying 0 0 0 0  Worry too much - different things 1 0 0 0  Trouble relaxing 0 0 0 0  Restless 0 0 0 0  Easily annoyed or irritable 0 0 0 1  Afraid - awful might happen 0 0 0 0  Total GAD 7 Score 1 0 0 1  Anxiety Difficulty Not difficult at all Not difficult at all Not difficult at all Not difficult at all   SDOH Screenings   Food Insecurity: No Food Insecurity (07/29/2024)  Housing: Unknown (07/29/2024)  Transportation Needs: No Transportation Needs (07/29/2024)  Utilities: Not At Risk (08/18/2023)  Alcohol Screen: Low Risk (08/18/2023)  Depression (PHQ2-9): Low Risk (08/01/2024)  Financial Resource Strain: High Risk (07/29/2024)  Physical Activity: Insufficiently Active (07/29/2024)  Social Connections: Moderately Isolated (07/29/2024)  Stress: Stress Concern Present (07/29/2024)  Tobacco Use: Medium Risk (08/01/2024)  Health Literacy: Adequate Health Literacy (08/18/2023)     Physical Exam Constitutional:      Appearance: Normal appearance.  HENT:     Head: Normocephalic and atraumatic.     Right Ear: There is no impacted cerumen.     Left Ear: There is no impacted cerumen.     Ears:     Comments: Narrow b/l ear canal.  Dix-Hallpike: Right-sided head tilt provoked vertigo    Mouth/Throat:     Mouth: Mucous membranes are moist.  Neck:     Thyroid : No thyroid  mass or thyroid  tenderness.  Cardiovascular:     Rate and Rhythm: Normal rate and regular rhythm.  Pulmonary:     Effort: Pulmonary effort is normal. No respiratory distress.     Breath sounds: Normal breath sounds. No wheezing.  Abdominal:     General: Bowel sounds are normal. There is no distension.     Palpations: Abdomen is soft.     Tenderness: There is abdominal tenderness (generalized tenderness on palpaiton of all 4 abdominal quadrants, bowel sound normal. No obvious hepatosplenomegaly on exam.). There is no guarding.  Musculoskeletal:      Cervical back: Neck supple. No rigidity.     Right lower leg: No edema.     Left lower leg: No edema.  Skin:    General: Skin is warm.     Findings: No bruising.  Neurological:     Mental Status: She is alert and oriented to person, place, and time. Mental status is at baseline.  Psychiatric:        Mood and Affect: Mood normal. Mood is not anxious.        Behavior: Behavior normal. Behavior is cooperative.        No results found for any visits on 08/01/24.  The ASCVD Risk score (Arnett DK, et al., 2019) failed to calculate for the following reasons:   Risk score cannot be calculated because patient has a medical history suggesting prior/existing ASCVD   * - Cholesterol units were assumed     Assessment & Plan:  Patient is a pleasant 64 year old female presenting for chronic medication management.  Assessment & Plan Severe persistent asthma without complication (HCC) Use albuterol  prn in adjunct with treatment as recommended by pulmonology.  Orders:   albuterol  (VENTOLIN  HFA) 108 (90 Base) MCG/ACT inhaler; Inhale 2 puffs into the lungs every 6 (six) hours as needed for wheezing or shortness of breath.  Atrial fibrillation, unspecified type [facility]) Continue follow up with cardiology. On Rivaroxaban  2.5 mg BID, no new concerns.     Systolic CHF, chronic (HCC) Established and managed by cardiology.  Patient euvolemic. Continue medications as prescribed by her cardiologist, recommend she reaches out to our office or cardiology if she has problem with refill for Metoprolol . - Metoprolol  succinate 50 mg daily  - Imdur  30 mg twice daily - Nitroglycerin  0.4 mg sublingual as needed, she has not needed this.  - K-Chlor 10 mEq daily - Rivaroxaban  2.5 mg twice daily, - Lasix  20 mg daily  - Alirocumab  75 mg every 14 days     Pituitary macroadenoma (HCC) Continue close follow-up with [facility]. Last eye exam on 05/2024. Her last MRI head was done on 03/11/2022, with  radiology reading, Stable pituitary macroadenoma noted on prior MRI. No acute intracranial abnormality seen.  No new concerns.     Hyperlipidemia associated with type 2 diabetes mellitus (HCC) Continue Crestor  20 mg daily     Chronic kidney disease, stage 3a (HCC) Avoid antiinflammatory medications. Continue f/u with Dr. Douglas      Sensorimotor neuropathy Involving cervical spine, lower leg.   Stable with Pregabalin  75 mg BID. PDMP reviewed. Refill sent.  Orders:   pregabalin  (LYRICA ) 75 MG capsule; Take 1 capsule (75 mg total) by mouth 2 (two) times daily.  Pruritus of both eyes Secondary to allergen.  Currently asymptomatic. Continue prn Pataday  0.2% once daily which she gets this over the counter.      Mood disorder Mood stable, no SI/HI. Continue Paxil  20 mg daily.     Vertigo Chronic vertigo with tinnitus. Hydroxyzine  used with caution due to sedation risk. Referred to ENT for evaluation into tinnitus, reduced hearing for about 3 months.  Referred to vestibular therapy. Prescribed hydroxyzine  25 mg as needed daily with precaution. Recommend stopping medication if dizziness, drowsiness, urinary retention.  Orders:   hydrOXYzine  (ATARAX ) 25 MG tablet; Take 1 tablet (25 mg total) by mouth daily as needed for anxiety.   Ambulatory referral to Physical Therapy   Ambulatory referral to ENT  Tinnitus of both ears Plan per vertigo.  Orders:   Ambulatory referral to ENT  Overactive bladder Reviewed UA from 07/26/24, reassuring. Chronic overactive bladder with urgency and frequency.  Continue Gemtesa . Provided Kegel exercise instructions. Referred to  urology for further evaluation. Previously also did pelvic floor therapy.  Orders:   Ambulatory referral to Urology  History of smoking 10-25 pack years Pulmonology referral for screening for lung cancer for low dose CT done.  Orders:   Ambulatory Referral for Lung Cancer Scre    Return in about 6 months (around  01/29/2025) for Chronic follow up in 5-6 months.   Luke Shade, MD "

## 2024-08-01 NOTE — Assessment & Plan Note (Addendum)
 Use albuterol  prn in adjunct with treatment as recommended by pulmonology.  Orders:   albuterol  (VENTOLIN  HFA) 108 (90 Base) MCG/ACT inhaler; Inhale 2 puffs into the lungs every 6 (six) hours as needed for wheezing or shortness of breath.

## 2024-08-01 NOTE — Assessment & Plan Note (Addendum)
 Involving cervical spine, lower leg.   Stable with Pregabalin  75 mg BID. PDMP reviewed. Refill sent.  Orders:   pregabalin  (LYRICA ) 75 MG capsule; Take 1 capsule (75 mg total) by mouth 2 (two) times daily.

## 2024-08-01 NOTE — Assessment & Plan Note (Addendum)
"   Continue Crestor  20 mg daily.  "

## 2024-08-01 NOTE — Assessment & Plan Note (Addendum)
 Established and managed by cardiology.  Patient euvolemic. Continue medications as prescribed by her cardiologist, recommend she reaches out to our office or cardiology if she has problem with refill for Metoprolol . - Metoprolol  succinate 50 mg daily  - Imdur  30 mg twice daily - Nitroglycerin  0.4 mg sublingual as needed, she has not needed this.  - K-Chlor 10 mEq daily - Rivaroxaban  2.5 mg twice daily, - Lasix  20 mg daily  - Alirocumab  75 mg every 14 days

## 2024-08-01 NOTE — Assessment & Plan Note (Addendum)
 Mood stable, no SI/HI. Continue Paxil  20 mg daily.

## 2024-08-05 ENCOUNTER — Other Ambulatory Visit: Payer: Self-pay

## 2024-08-05 ENCOUNTER — Emergency Department

## 2024-08-05 ENCOUNTER — Emergency Department: Admission: EM | Admit: 2024-08-05 | Discharge: 2024-08-06 | Disposition: A

## 2024-08-05 DIAGNOSIS — Z951 Presence of aortocoronary bypass graft: Secondary | ICD-10-CM | POA: Diagnosis not present

## 2024-08-05 DIAGNOSIS — R7989 Other specified abnormal findings of blood chemistry: Secondary | ICD-10-CM | POA: Diagnosis not present

## 2024-08-05 DIAGNOSIS — R112 Nausea with vomiting, unspecified: Secondary | ICD-10-CM | POA: Diagnosis not present

## 2024-08-05 DIAGNOSIS — I509 Heart failure, unspecified: Secondary | ICD-10-CM | POA: Insufficient documentation

## 2024-08-05 DIAGNOSIS — R079 Chest pain, unspecified: Secondary | ICD-10-CM | POA: Insufficient documentation

## 2024-08-05 DIAGNOSIS — R1084 Generalized abdominal pain: Secondary | ICD-10-CM | POA: Insufficient documentation

## 2024-08-05 DIAGNOSIS — I2510 Atherosclerotic heart disease of native coronary artery without angina pectoris: Secondary | ICD-10-CM | POA: Diagnosis not present

## 2024-08-05 DIAGNOSIS — D72829 Elevated white blood cell count, unspecified: Secondary | ICD-10-CM | POA: Diagnosis not present

## 2024-08-05 DIAGNOSIS — R109 Unspecified abdominal pain: Secondary | ICD-10-CM

## 2024-08-05 LAB — COMPREHENSIVE METABOLIC PANEL WITH GFR
ALT: 38 U/L (ref 0–44)
AST: 29 U/L (ref 15–41)
Albumin: 4.6 g/dL (ref 3.5–5.0)
Alkaline Phosphatase: 85 U/L (ref 38–126)
Anion gap: 16 — ABNORMAL HIGH (ref 5–15)
BUN: 13 mg/dL (ref 8–23)
CO2: 24 mmol/L (ref 22–32)
Calcium: 10.1 mg/dL (ref 8.9–10.3)
Chloride: 101 mmol/L (ref 98–111)
Creatinine, Ser: 1.1 mg/dL — ABNORMAL HIGH (ref 0.44–1.00)
GFR, Estimated: 55 mL/min — ABNORMAL LOW
Glucose, Bld: 132 mg/dL — ABNORMAL HIGH (ref 70–99)
Potassium: 4.7 mmol/L (ref 3.5–5.1)
Sodium: 140 mmol/L (ref 135–145)
Total Bilirubin: 0.5 mg/dL (ref 0.0–1.2)
Total Protein: 7.8 g/dL (ref 6.5–8.1)

## 2024-08-05 LAB — CBC
HCT: 47.6 % — ABNORMAL HIGH (ref 36.0–46.0)
Hemoglobin: 15.5 g/dL — ABNORMAL HIGH (ref 12.0–15.0)
MCH: 31.4 pg (ref 26.0–34.0)
MCHC: 32.6 g/dL (ref 30.0–36.0)
MCV: 96.6 fL (ref 80.0–100.0)
Platelets: 166 K/uL (ref 150–400)
RBC: 4.93 MIL/uL (ref 3.87–5.11)
RDW: 13.5 % (ref 11.5–15.5)
WBC: 8.4 K/uL (ref 4.0–10.5)
nRBC: 0 % (ref 0.0–0.2)

## 2024-08-05 LAB — LIPASE, BLOOD: Lipase: 30 U/L (ref 11–51)

## 2024-08-05 LAB — URINALYSIS, ROUTINE W REFLEX MICROSCOPIC
Bacteria, UA: NONE SEEN
Bilirubin Urine: NEGATIVE
Glucose, UA: >=500 mg/dL — AB
Hgb urine dipstick: NEGATIVE
Ketones, ur: 5 mg/dL — AB
Leukocytes,Ua: NEGATIVE
Nitrite: NEGATIVE
Protein, ur: NEGATIVE mg/dL
Specific Gravity, Urine: 1.043 — ABNORMAL HIGH (ref 1.005–1.030)
pH: 7 (ref 5.0–8.0)

## 2024-08-05 LAB — LACTIC ACID, PLASMA
Lactic Acid, Venous: 3 mmol/L (ref 0.5–1.9)
Lactic Acid, Venous: 2.3 mmol/L (ref 0.5–1.9)

## 2024-08-05 LAB — TROPONIN T, HIGH SENSITIVITY: Troponin T High Sensitivity: <15 ng/L (ref 0–19)

## 2024-08-05 MED ORDER — IOHEXOL 300 MG/ML  SOLN
100.0000 mL | Freq: Once | INTRAMUSCULAR | Status: AC | PRN
  Administered 2024-08-05: 100 mL via INTRAVENOUS

## 2024-08-05 MED ORDER — ONDANSETRON HCL 4 MG/2ML IJ SOLN
4.0000 mg | Freq: Once | INTRAMUSCULAR | Status: AC
  Administered 2024-08-05: 4 mg via INTRAVENOUS
  Filled 2024-08-05: qty 2

## 2024-08-05 MED ORDER — SODIUM CHLORIDE 0.9 % IV BOLUS
500.0000 mL | Freq: Once | INTRAVENOUS | Status: AC
  Administered 2024-08-05: 500 mL via INTRAVENOUS

## 2024-08-05 MED ORDER — MORPHINE SULFATE (PF) 4 MG/ML IV SOLN
4.0000 mg | Freq: Once | INTRAVENOUS | Status: AC
  Administered 2024-08-05: 4 mg via INTRAVENOUS
  Filled 2024-08-05: qty 1

## 2024-08-05 NOTE — ED Notes (Signed)
 Pt transported to CT at this time.

## 2024-08-05 NOTE — ED Notes (Signed)
 Labs were redrawn and sent to lab.

## 2024-08-05 NOTE — ED Triage Notes (Signed)
 Pt BIB [facility] from home C/O abdominal pain, n/v, chills X 1 day.

## 2024-08-05 NOTE — ED Provider Notes (Signed)
 "  [facility] Provider Note    Event Date/Time   First MD Initiated Contact with Patient 08/05/24 1953     (approximate)   History   Abdominal Pain   HPI  [name] is a 64 y.o. female with a past medical history of CAD status post NSTEMI with stent, CABG, CHF, chronic pain of the inguinal region, depression, headaches, multiple allergies to medications, splenectomy, cholecystectomy who presents with 24 hours of generalized abdominal pain nausea vomiting and chills.  Patient reports that she is having normal bowel movements and denies any bright red blood per rectum or any dark stools.  She wonders whether this could be food poisoning as she had some food that was questionable yesterday.  While she was waiting for me to assess her, she developed some chest pain.  Denies any shortness of breath.  She presents with her daughters who help contribute to the history     Physical Exam   Triage Vital Signs: ED Triage Vitals  Encounter Vitals Group     BP 08/05/24 1859 (!) 151/93     Girls Systolic BP Percentile --      Girls Diastolic BP Percentile --      Boys Systolic BP Percentile --      Boys Diastolic BP Percentile --      Pulse Rate 08/05/24 1859 79     Resp 08/05/24 1859 18     Temp 08/05/24 1859 97.6 F (36.4 C)     Temp Source 08/05/24 1859 Oral     SpO2 08/05/24 1859 100 %     Weight --      Height --      Head Circumference --      Peak Flow --      Pain Score 08/05/24 1857 10     Pain Loc --      Pain Education --      Exclude from Growth Chart --     Most recent vital signs: Vitals:   08/05/24 1859  BP: (!) 151/93  Pulse: 79  Resp: 18  Temp: 97.6 F (36.4 C)  SpO2: 100%    Nursing Triage Note reviewed. Vital signs reviewed and patients oxygen  saturation is normoxic  General: Patient is well nourished, well developed, awake and alert, appears uncomfortable, holding abdomen Head: Normocephalic and atraumatic Eyes: Normal  inspection, extraocular muscles intact, no conjunctival pallor Ear, nose, throat: Normal external exam Neck: Normal range of motion Respiratory: Patient is in no respiratory distress, lungs CTAB Cardiovascular: Patient is not tachycardic, RRR without murmur appreciated GI: Abd soft, ttp generally, no rebound or guarding Back: Normal inspection of the back with good strength and range of motion throughout all ext Extremities: pulses intact with good cap refills, no LE pitting edema or calf tenderness Neuro: The patient is alert and oriented to person, place, and time, appropriately conversive, with 5/5 bilat UE/LE strength, no gross motor or sensory defects noted. Coordination appears to be adequate. Skin: Warm, dry, and intact Psych: normal mood and affect, no SI or HI  ED Results / Procedures / Treatments   Labs (all labs ordered are listed, but only abnormal results are displayed) Labs Reviewed  COMPREHENSIVE METABOLIC PANEL WITH GFR - Abnormal; Notable for the following components:      Result Value   Glucose, Bld 132 (*)    Creatinine, Ser 1.10 (*)    GFR, Estimated 55 (*)    Anion gap 16 (*)  All other components within normal limits  CBC - Abnormal; Notable for the following components:   Hemoglobin 15.5 (*)    HCT 47.6 (*)    All other components within normal limits  URINALYSIS, ROUTINE W REFLEX MICROSCOPIC - Abnormal; Notable for the following components:   Color, Urine YELLOW (*)    APPearance CLEAR (*)    Specific Gravity, Urine 1.043 (*)    Glucose, UA >=500 (*)    Ketones, ur 5 (*)    All other components within normal limits  LACTIC ACID, PLASMA - Abnormal; Notable for the following components:   Lactic Acid, Venous 3.0 (*)    All other components within normal limits  LIPASE, BLOOD  LACTIC ACID, PLASMA  TROPONIN T, HIGH SENSITIVITY  TROPONIN T, HIGH SENSITIVITY     EKG EKG and rhythm strip are interpreted by myself:   EKG: [Normal sinus rhythm] at  heart rate of 60, normal QRS duration, QTc 474, nonspecific ST segments and T waves no ectopy EKG not consistent with Acute STEMI Rhythm strip: NSR in lead II TWI in anterior will leads however these are unchanged from EKG obtained 08/05/2024   RADIOLOGY Chest x-ray:  CT abdomen pelvis with IV contrast: No acute abnormality on my independent review interpretation radiologist agrees    PROCEDURES:  Critical Care performed: No  Procedures   MEDICATIONS ORDERED IN ED: Medications  morphine  (PF) 4 MG/ML injection 4 mg (4 mg Intravenous Given 08/05/24 2040)  ondansetron  (ZOFRAN ) injection 4 mg (4 mg Intravenous Given 08/05/24 2049)  sodium chloride  0.9 % bolus 500 mL (500 mLs Intravenous New Bag/Given 08/05/24 2039)  iohexol  (OMNIPAQUE ) 300 MG/ML solution 100 mL (100 mLs Intravenous Contrast Given 08/05/24 2102)     IMPRESSION / MDM / ASSESSMENT AND PLAN / ED COURSE                                Differential diagnosis includes, but is not limited to: Atypical ACS, small bowel obstruction, diverticulitis, colitis, electrolyte derangement, anemia   ED course: Patient presents acutely but abdominal exam demonstrates no evidence of peritonitis.  She was given gentle fluids given her heart history, Zofran  and morphine  with improvement in symptoms.  EKG demonstrated no evidence of acute ischemia.  Initial high-sensitivity troponin was not elevated.  Urinalysis was not consistent with UTI.  She had no leukocytosis or acute anemia.  She had no profound electrolyte derangements or acute renal insufficiency.  She did have a mild anion gap.  Surprisingly patient's lactic acid was elevated at 3.0 however this may be secondary to vomiting and prerenal.  I do not think her presentation today is consistent with mesenteric ischemia.  Repeat lactate after IV fluids is pending along with a repeat troponin.  Chest x-ray is pending as well.  If these demonstrate improvement in lactate and no elevation of troponin  on no abnormality x-ray anticipate patient can likely return home with Zofran  and careful return precautions   Clinical Course as of 08/05/24 2306  Fri Aug 05, 2024  1955 WBC: 8.4 No leukocytosis [HD]  1955 HCT(!): 47.6 No acute anemia [HD]  2005 Patient now endorsing chest pain [HD]  2136 Troponin T High Sensitivity: <15 Not elevated, will need second [HD]  2139 Patient reassessed feels improved reviewed results [HD]  2230 Lactic Acid, Venous(!!): 3.0 Slightly elevated we will get a repeat after 500 cc of IV fluid [HD]  2248 Urinalysis, Routine  w reflex microscopic -Urine, Clean Catch(!) Not consistent with UTI [HD]  2249 CBC(!) And leukocytosis [HD]    Clinical Course User Index [HD] Nicholaus Rolland BRAVO, MD   -- Risk: 5 This patient has a high risk of morbidity due to further diagnostic testing or treatment. Rationale: This patients evaluation and management involve a high risk of morbidity due to the potential severity of presenting symptoms, need for diagnostic testing, and/or initiation of treatment that may require close monitoring. The differential includes conditions with potential for significant deterioration or requiring escalation of care. Treatment decisions in the ED, including medication administration, procedural interventions, or disposition planning, reflect this level of risk. COPA: 5 The patient has the following acute or chronic illness/injury that poses a possible threat to life or bodily function: [X] : The patient has a potentially serious acute condition or an acute exacerbation of a chronic illness requiring urgent evaluation and management in the Emergency Department. The clinical presentation necessitates immediate consideration of life-threatening or function-threatening diagnoses, even if they are ultimately ruled out.   FINAL CLINICAL IMPRESSION(S) / ED DIAGNOSES   Final diagnoses:  None     Rx / DC Orders   ED Discharge Orders     None         Note:  This document was prepared using Dragon voice recognition software and may include unintentional dictation errors. "

## 2024-08-05 NOTE — ED Notes (Signed)
 Pt to ED from home [facility] for vomiting and abdominal pain. Pt ate [employer] last night and has been vomiting since and having abdominal pain. Now drenched in sweat per EMS.   EMS VS: 99.8, HR 80s, 132/68, CBG 173

## 2024-08-05 NOTE — ED Notes (Signed)
"  Pt encouraged to provide urine sample.   "

## 2024-08-05 NOTE — ED Notes (Signed)
 Lab called and notified about new blood work that was sent.

## 2024-08-06 LAB — TROPONIN T, HIGH SENSITIVITY: Troponin T High Sensitivity: <15 ng/L (ref 0–19)

## 2024-08-06 MED ORDER — ONDANSETRON 4 MG PO TBDP: 4.0000 mg | ORAL_TABLET | Freq: Three times a day (TID) | ORAL | 0 refills | Status: AC | PRN

## 2024-08-06 NOTE — Discharge Instructions (Addendum)
 Your workup today was reassuring but I do think you were dehydrated.  Continue oral hydration (your urine should be clear).  Stick to bland foods.  Continue your regular medic indications.  Please return with any acutely worsening symptoms or any other emergency. -- RETURN PRECAUTIONS & AFTERCARE: (ENGLISH) RETURN PRECAUTIONS: Return immediately to the emergency department or see/call your doctor if you feel worse, weak or have changes in speech or vision, are short of breath, have fever, vomiting, pain, bleeding or dark stool, trouble urinating or any new issues. Return here or see/call your doctor if not improving as expected for your suspected condition. FOLLOW-UP CARE: Call your doctor and/or any doctors we referred you to for more advice and to make an appointment. Do this today, tomorrow or after the weekend. Some doctors only take PPO insurance so if you have HMO insurance you may want to contact your HMO or your regular doctor for referral to a specialist within your plan. Either way tell the doctor's office that it was a referral from the emergency department so you get the soonest possible appointment.  YOUR TEST RESULTS: Take result reports of any blood or urine tests, imaging tests and EKG's to your doctor and any referral doctor. Have any abnormal tests repeated. Your doctor or a referral doctor can let you know when this should be done. Also make sure your doctor contacts this hospital to get any test results that are not currently available such as cultures or special tests for infection and final imaging reports, which are often not available at the time you leave the ER but which may list additional important findings that are not documented on the preliminary report. BLOOD PRESSURE: If your blood pressure was greater than 120/80 have your blood pressure rechecked within 1 to 2 weeks. MEDICATION SIDE EFFECTS: Do not drive, walk, bike, take the bus, etc. if you have received or are being  prescribed any sedating medications such as those for pain or anxiety or certain antihistamines like Benadryl . If you have been give one of these here get a taxi home or have a friend drive you home. Ask your pharmacist to counsel you on potential side effects of any new medication

## 2024-08-11 ENCOUNTER — Telehealth: Payer: Self-pay

## 2024-08-11 DIAGNOSIS — Z87891 Personal history of nicotine dependence: Secondary | ICD-10-CM

## 2024-08-11 DIAGNOSIS — Z122 Encounter for screening for malignant neoplasm of respiratory organs: Secondary | ICD-10-CM

## 2024-08-11 NOTE — Telephone Encounter (Signed)
 Lung Cancer Screening Narrative/Criteria Questionnaire (Cigarette Smokers Only- No Cigars/Pipes/vapes)   [name]   SDMV:08/18/2024 at 11:00 Natalie        [birth year]               LDCT: 08/23/2024 at 11:00 [facility]    63 y.o.   Phone: [phone]  Lung Screening Narrative (confirm age 37-77 yrs Medicare / 50-80 yrs Private pay insurance)   Insurance information: [employer] Medicare   Referring Provider: Abbey, MD   This screening involves an initial phone call with a team member from our program. It is called a shared decision making visit. The initial meeting is required by  insurance and Medicare to make sure you understand the program. This appointment takes about 15-20 minutes to complete. You will complete the screening scan at your scheduled date/time.  This scan takes about 5-10 minutes to complete. You can eat and drink normally before and after the scan.  Criteria questions for Lung Cancer Screening:   Are you a current or former smoker? Former Age began smoking: 13   If you are a former smoker, what year did you quit smoking?Quit 2014 (within 15 yrs)   To calculate your smoking history, I need an accurate estimate of how many packs of cigarettes you smoked per day and for how many years. (Not just the number of PPD you are now smoking)   Years smoking 38 x Packs per day 2.5 = Pack years 95   (at least 20 pack yrs)   (Make sure they understand that we need to know how much they have smoked in the past, not just the number of PPD they are smoking now)  Do you have a personal history of cancer?  No    Do you have a family history of cancer? Yes  (cancer type and and relative) Mother had breast cancer. Father had colon cancer.   Are you coughing up blood?  No  Have you had unexplained weight loss of 15 lbs or more in the last 6 months? No  It looks like you meet all criteria.  When would be a good time for us  to schedule you for this screening?   Additional information:  N/A

## 2024-08-12 NOTE — Progress Notes (Signed)
 [name]                                          MRN: [id number]   08/12/2024   The [facility]BC[facility] Quality Team Specialist reviewed this patient medical record for the purposes of chart review for care gap closure. The following were reviewed: abstraction for care gap closure-glycemic status assessment.    VBCI Quality Team

## 2024-08-17 ENCOUNTER — Encounter: Payer: Self-pay | Admitting: Urology

## 2024-08-17 ENCOUNTER — Ambulatory Visit: Admitting: Urology

## 2024-08-17 VITALS — BP 109/53 | HR 91 | Ht 65.0 in | Wt 193.0 lb

## 2024-08-17 DIAGNOSIS — N3281 Overactive bladder: Secondary | ICD-10-CM

## 2024-08-17 DIAGNOSIS — N3941 Urge incontinence: Secondary | ICD-10-CM

## 2024-08-17 LAB — URINALYSIS, COMPLETE
Bilirubin, UA: NEGATIVE
Ketones, UA: NEGATIVE
Leukocytes,UA: NEGATIVE
Nitrite, UA: NEGATIVE
Protein,UA: NEGATIVE
Specific Gravity, UA: 1.01 (ref 1.005–1.030)
Urobilinogen, Ur: 0.2 mg/dL (ref 0.2–1.0)
pH, UA: 6 (ref 5.0–7.5)

## 2024-08-17 LAB — MICROSCOPIC EXAMINATION
Bacteria, UA: NONE SEEN
Epithelial Cells (non renal): NONE SEEN /HPF (ref 0–10)

## 2024-08-17 LAB — BLADDER SCAN AMB NON-IMAGING: Scan Result: 0

## 2024-08-17 MED ORDER — SOLIFENACIN SUCCINATE 5 MG PO TABS: 5.0000 mg | ORAL_TABLET | Freq: Every day | ORAL | 11 refills | Status: AC

## 2024-08-17 NOTE — Progress Notes (Signed)
 "  08/17/2024 5:37 PM   [name] 03/29/1961 [id number]  Referring provider: Abbey Bruckner, MD [address] [place],  [place] [address]  Chief Complaint  Patient presents with   Over Active Bladder    HPI: [name] is a 64 y.o. female referred for evaluation of overactive bladder.  On Gemtesa  since May 2023 for overactive bladder symptoms including frequency, urgency, painful bladder spasm and urge incontinence No dysuria or gross hematuria Gemtesa  initially was beneficial at controlling symptoms however over the past several months she has had recurrent symptoms as above. 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 being followed by neurosurgery/eye doctor and pcp. monitoring size.causes sinus problems   Breast mass, right 01/14/2021   Bronchitis    Bunion of right foot 08/26/2018   Cardiac arrhythmia    CCF (congestive cardiac failure) (HCC) 06/19/2015   Cervical neck pain with evidence of disc disease    C5/6 disease, MRI done late 2012 - no records available   Chronic combined systolic and diastolic CHF (congestive heart failure) (HCC)    a. 08/2010 Echo: mildly reduced EF 40-45%, mild diffuse hypokinesis; b. 06/2016 Echo: EF 50%, no rwma, mild to mod TR; c. 03/2017 Echo: EF 40-45%, Gr1 DD (prior echo reviewed and EF felt to be lower than reported).    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 12/2018 PFT: No obvious obst/restrictive dzs.   COPD exacerbation (HCC) 03/12/2022   Coronary artery disease    a. PCI of LCX 2003; b. PCI of the LAD 2012 with a (2.5 x 8 mm BMS);  c.s/p CABG 4/12: L-LAD, VG-Dx, VG-OM, VG-RCA (Dr. Fleeta Ochoa);  d. 01/2012 MV: inf infarct, attenuation, no ischemia; e. 02/2017 MV: signifi attenuation artifact. Fixed basal antlat/inflat scar vs artifact. Reversible apical lat and mid antlat defect - ? atten vs ischemia. F/u echo w/o wma->Med rx.   Coronary artery disease involving native coronary artery of native heart without angina pectoris 08/23/2010   Depression    Depression    Diabetes mellitus without complication [facility])    Fall 05/14/2018   Family history of colon cancer    Family history of colon cancer 06/19/2015   Gastritis without bleeding    Generalized headaches    frequent   GERD (gastroesophageal reflux disease)    Headache    Heart murmur    Hematochezia  Hematochezia 01/11/2015   Hepatitis    history of hepatitis b   History of cervical cancer    s/p cryotherapy   History of cervical cancer    s/p cryotherapy   History of colonic polyps 01/11/2015   History of drug abuse (HCC)    cocaine, marijuana, clean since 1989   History of drug abuse (HCC)    cocaine, marijuana, clean since 1989   History of hepatitis B    from eating undercooked liver   History of MI (myocardial infarction)    HTN (hypertension) 12/20/2011   Hyperlipidemia    Hypertension    Insomnia 04/19/2020   Intractable vomiting with nausea    Intractable vomiting with nausea    Ischemic cardiomyopathy    a. 03/2017 Echo: EF 40-45%,  Gr1 DD.   Lipoma of right lower extremity 08/30/2020   Mild intermittent asthma 10/13/2019   Myocardial infarction [facility]) 2003, 2012   Need for pneumococcal 20-valent conjugate vaccination 07/06/2022   Numbness and tingling in right hand 02/24/2018   Orthostatic hypotension 04/30/2015   PAD (peripheral artery disease)    a. s/p Right SFA atherectomy and PTA 01/15/11; b. 07/2018 ABI: R 1.02, L 1.09.   Pituitary mass    a. 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 Mass abuts R cavernous sinus w/o definite invasion.   PND (post-nasal drip) 07/24/2021   Polyp of colon    Pruritus of both eyes 09/21/2020   Right knee pain 12/20/2011   Abnormal MRI right knee 08/29/2016 f/u [facility]      FINDINGS: Medial compartment: [There is abnormal linear signal within the medial meniscus near junction of posterior horn and mid body which abuts the tibial undersurface consistent with nondisplaced tear (5:8-10). The articular cartilage, and subchondral bone are normal. 3 mm bone island (7:14).  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   Seasonal allergies    Shortness of breath 08/23/2010   Qualifier: Diagnosis of   By: Claudene RN, Megan       Skin lesion 04/19/2021   Sleep apnea    uses cpap   Smoking history    quit 07/2010   Smoking history 12/16/2010   Quit 07/2010   Status post total knee replacement using cement, right 04/14/2019   Thrush 04/19/2021   Thyroid  disease    Urinary incontinence    Urinary incontinence 12/18/2017   Vaginal itching 04/19/2021   Vasomotor flushing 11/24/2016    Surgical History: Past Surgical History:  Procedure Laterality Date   ABDOMINAL HYSTERECTOMY     2003   CHOLECYSTECTOMY  1986   COLONOSCOPY  2008   3 polyps   COLONOSCOPY WITH PROPOFOL  N/A 02/06/2015   Procedure: COLONOSCOPY WITH PROPOFOL ;  Surgeon: Rogelia Copping, MD;  Location: [facility] ENDOSCOPY;  Service: Endoscopy;  Laterality: N/A;   COLONOSCOPY WITH  PROPOFOL  N/A 01/27/2017   Procedure: COLONOSCOPY WITH PROPOFOL ;  Surgeon: Copping Rogelia, MD;  Location: [facility] ENDOSCOPY;  Service: Endoscopy;  Laterality: N/A;   COLONOSCOPY WITH PROPOFOL  N/A 07/15/2022   Procedure: COLONOSCOPY WITH PROPOFOL ;  Surgeon: Unk Corinn Skiff, MD;  Location: [facility] ENDOSCOPY;  Service: Gastroenterology;  Laterality: N/A;   CORONARY ANGIOPLASTY     w/ stent placement x2   CORONARY ARTERY BYPASS GRAFT  2012   Dr Perla   CORONARY STENT PLACEMENT  2003   S/P MI   CORONARY STENT PLACEMENT  2007   [place]   ESOPHAGOGASTRODUODENOSCOPY (EGD) WITH PROPOFOL   N/A 02/06/2015   Procedure: ESOPHAGOGASTRODUODENOSCOPY (EGD) WITH PROPOFOL ;  Surgeon: Rogelia Copping, MD;  Location: [facility] ENDOSCOPY;  Service: Endoscopy;  Laterality: N/A;   ESOPHAGOGASTRODUODENOSCOPY (EGD) WITH PROPOFOL  N/A 01/27/2017   Procedure: ESOPHAGOGASTRODUODENOSCOPY (EGD) WITH PROPOFOL ;  Surgeon: Copping Rogelia, MD;  Location: [facility] ENDOSCOPY;  Service: Endoscopy;  Laterality: N/A;   ESOPHAGOGASTRODUODENOSCOPY (EGD) WITH PROPOFOL  N/A 09/01/2019   Procedure: ESOPHAGOGASTRODUODENOSCOPY (EGD) WITH PROPOFOL ;  Surgeon: Unk Corinn Skiff, MD;  Location: [facility] ENDOSCOPY;  Service: Gastroenterology;  Laterality: N/A;   FEMORAL ARTERY STENT  10/2010   right sided (Dr. Wonda)   KNEE ARTHROSCOPY WITH MEDIAL MENISECTOMY Right 08/20/2017   Procedure: KNEE ARTHROSCOPY WITH MEDIAL  AND LATERAL MENISECTOMY;  Surgeon: Kathlynn Sharper, MD;  Location: [facility] ORS;  Service: Orthopedics;  Laterality: Right;   POSTERIOR CERVICAL LAMINECTOMY N/A 03/08/2018   Procedure: POSTERIOR CERVICAL LAMINECTOMY-C7;  Surgeon: Bluford Standing, MD;  Location: [facility] ORS;  Service: Neurosurgery;  Laterality: N/A;   TONSILLECTOMY     TOTAL KNEE ARTHROPLASTY Right 04/14/2019   Procedure: RIGHT TOTAL KNEE ARTHROPLASTY;  Surgeon: Kathlynn Sharper, MD;  Location: [facility] ORS;  Service: Orthopedics;  Laterality: Right;   TUBAL LIGATION      Home Medications:  Allergies as of  08/17/2024       Reactions   Citrullus Vulgaris Other (See Comments)   Throat itchy, watermelons   Latex Rash      Shellfish Allergy Anaphylaxis   Hard shellfish/swelling in throat   Sulfa Antibiotics Anaphylaxis   Swelling in throat.   Sulfonamide Derivatives Anaphylaxis   Swelling in throat.   Misc. Sulfonamide Containing Compounds    Penicillins Itching   Soy Allergy (obsolete)    Diarrhea/upset stomach   Tomato Itching   Throat itchy - also Raw carrots   Other Swelling        Medication List        Accurate as of August 17, 2024  5:37 PM. If you have any questions, ask your nurse or doctor.          STOP taking these medications    Gemtesa  75 MG Tabs Generic drug: Vibegron  Stopped by: Glendia Barba, MD       TAKE these medications    acetaminophen  650 MG CR tablet Commonly known as: Tylenol  8 Hour Take 2 tablets (1,300 mg total) by mouth 2 (two) times daily as needed for pain.   AeroChamber MV inhaler Use as instructed   albuterol  108 (90 Base) MCG/ACT inhaler Commonly known as: VENTOLIN  HFA Inhale 2 puffs into the lungs every 6 (six) hours as needed for wheezing or shortness of breath.   aspirin  EC 81 MG tablet Take 1 tablet (81 mg total) by mouth daily.   Azelastine  HCl 0.15 % Soln Place 2 sprays into both nostrils daily as needed (allergies).   EPINEPHrine  0.3 mg/0.3 mL Soaj injection Commonly known as: EPI-PEN Inject one pen into the thigh as needed for anaphylactic reaction. Then CALL 911. Use second pen if needed thereafter   esomeprazole  40 MG capsule Commonly known as: NEXIUM  Take 1 capsule (40 mg total) by mouth daily.   ezetimibe  10 MG tablet Commonly known as: ZETIA  Take 1 tablet (10 mg total) by mouth daily.   Farxiga  10 MG Tabs tablet Generic drug: dapagliflozin  propanediol Take 1 tablet (10 mg total) by mouth daily before breakfast.   fluticasone  50 MCG/ACT nasal spray Commonly known as: FLONASE  Place 2 sprays into both  nostrils daily. Prn nasal congestion   furosemide  20 MG tablet  Commonly known as: LASIX  TAKE 1 TABLET BY MOUTH DAILY AND 1 TABLET AT LUNCH 3 TIMES A WEEK   hydrocortisone  cream 1 % Apply topically 2 (two) times daily.   hydrOXYzine  25 MG tablet Commonly known as: ATARAX  Take 1 tablet (25 mg total) by mouth daily as needed for anxiety.   ipratropium-albuterol  0.5-2.5 (3) MG/3ML Soln Commonly known as: DUONEB USE 1 VIAL IN NEBULIZER TWICE DAILY AS NEEDED FOR WHEEZING FOR SHORTNESS OF BREATH   isosorbide  mononitrate 30 MG 24 hr tablet Commonly known as: IMDUR  Take 1 tablet (30 mg total) by mouth 2 (two) times daily.   metoprolol  succinate 50 MG 24 hr tablet Commonly known as: TOPROL -XL Take 1 tablet (50 mg total) by mouth daily.   montelukast  10 MG tablet Commonly known as: SINGULAIR  Take 1 tablet (10 mg total) by mouth at bedtime.   nitroGLYCERIN  0.4 MG SL tablet Commonly known as: NITROSTAT  DISSOLVE ONE TABLET UNDER THE TONGUE EVERY 5 MINUTES AS NEEDED FOR CHEST PAIN.  DO NOT EXCEED A TOTAL OF 3 DOSES IN 15 MINUTES   olopatadine  0.1 % ophthalmic solution Commonly known as: PATANOL INSTILL 1 DROP INTO EACH EYE TWICE DAILY   ondansetron  4 MG disintegrating tablet Commonly known as: ZOFRAN -ODT Take 1 tablet (4 mg total) by mouth every 8 (eight) hours as needed for nausea or vomiting.   PARoxetine  20 MG tablet Commonly known as: PAXIL  Take 1 tablet (20 mg total) by mouth daily.   potassium chloride  10 MEQ tablet Commonly known as: KLOR-CON  M Take 1 tablet (10 mEq total) by mouth daily.   Praluent  75 MG/ML Soaj Generic drug: Alirocumab  Inject 1 mL (75 mg total) into the skin every 14 (fourteen) days.   pregabalin  75 MG capsule Commonly known as: LYRICA  Take 1 capsule (75 mg total) by mouth 2 (two) times daily. Start taking on: September 12, 2024   rivaroxaban  2.5 MG Tabs tablet Commonly known as: XARELTO  Take 1 tablet (2.5 mg total) by mouth 2 (two) times daily.    rosuvastatin  20 MG tablet Commonly known as: CRESTOR  Take 1 tablet (20 mg total) by mouth at bedtime.   sodium chloride  0.65 % Soln nasal spray Commonly known as: OCEAN Place 2 sprays into both nostrils daily as needed for congestion.   solifenacin  5 MG tablet Commonly known as: VESICARE  Take 1 tablet (5 mg total) by mouth daily. Started by: Glendia Barba, MD   Trelegy Ellipta  200-62.5-25 MCG/ACT Aepb Generic drug: Fluticasone -Umeclidin-Vilant Inhale 1 puff into the lungs daily.   Trelegy Ellipta  200-62.5-25 MCG/ACT Aepb Generic drug: Fluticasone -Umeclidin-Vilant Inhale 1 Act into the lungs daily.        Allergies: Allergies[1]  Family History: Family History  Problem Relation Age of Onset   Other Mother    Breast cancer Mother 59       breast cancer, late 64's   Cancer Mother        breast   Hypertension Father    Heart failure Father    Diabetes Father    Colon cancer Father 30   Glaucoma Father    Cancer Father        colorectal    Heart disease Father    Other Father        glaucoma   Brain cancer Sister    Arthritis Sister    Diabetes Sister    Hypertension Sister    Kidney disease Sister    Diabetes Brother    Hypertension Brother    Acute myelogenous  leukemia Grandson        06/2019   Coronary artery disease Neg Hx    Stroke Neg Hx     Social History:  reports that she quit smoking about 14 years ago. Her smoking use included cigarettes. She started smoking about 52 years ago. She has a 76 pack-year smoking history. She has never used smokeless tobacco. She reports that she does not drink alcohol and does not use drugs.   Physical Exam: BP (!) 109/53   Pulse 91   Ht 5' 5 (1.651 m)   Wt 193 lb (87.5 kg)   BMI 32.12 kg/m   Constitutional:  Alert, No acute distress. HEENT: [place] AT Respiratory: Normal respiratory effort, no increased work of breathing.  Psychiatric: Normal mood and affect.  Laboratory  Data:  Urinalysis Dipstick/microscopy negative   Pertinent Imaging: CT images were personally reviewed and interpreted.  I do not feel there is significant cortical scarring present on my review   CT Renal Stone Study  Narrative CLINICAL DATA:  Abdominal and flank pain.  EXAM: CT ABDOMEN AND PELVIS WITHOUT CONTRAST  TECHNIQUE: Multidetector CT imaging of the abdomen and pelvis was performed following the standard protocol without IV contrast.  RADIATION DOSE REDUCTION: This exam was performed according to the departmental dose-optimization program which includes automated exposure control, adjustment of the mA and/or kV according to patient size and/or use of iterative reconstruction technique.  COMPARISON:  CT abdomen and pelvis 07/09/2022  FINDINGS: Lower chest: There are trace bilateral pleural effusions  Hepatobiliary: No focal liver abnormality is seen. Status post cholecystectomy. No biliary dilatation.  Pancreas: Unremarkable. No pancreatic ductal dilatation or surrounding inflammatory changes.  Spleen: Normal in size without focal abnormality.  Adrenals/Urinary Tract: Low-attenuation left adrenal nodule compatible with adenoma measuring 18 mm appears unchanged from prior. The right adrenal gland, kidneys and bladder are within normal limits.  Stomach/Bowel: Stomach is within normal limits. No evidence of bowel wall thickening, distention, or inflammatory changes. The appendix is not definitely visualized. There are scattered colonic diverticula.  Vascular/Lymphatic: Aortic atherosclerosis. No enlarged abdominal or pelvic lymph nodes.  Reproductive: Status post hysterectomy. No adnexal masses.  Other: No abdominal wall hernia or abnormality. No abdominopelvic ascites.  Musculoskeletal: Degenerative changes affect the spine.  IMPRESSION: 1. No acute localizing process in the abdomen or pelvis. 2. Trace bilateral pleural effusions. 3. Colonic  diverticulosis without evidence for diverticulitis. 4. Stable left adrenal adenoma. 5. Aortic atherosclerosis.  Aortic Atherosclerosis (ICD10-I70.0).  A   Electronically Signed By: Greig Pique M.D. On: 01/15/2024 15:39   Assessment & Plan:    1. Overactive bladder with urge incontinence PVR 0 mL Additional management options were discussed.  We discussed combination therapy with a beta 3 agonist and anticholinergic medication is FDA approved and solifenacin  could be added to the Gemtesa  Second line options were reviewed including PTNS, sacral neuromodulation and Botox.  She was provided literature on Botox and PTNS She has elected to add an anticholinergic medication and Rx solifenacin  5 mg sent pharmacy.  We discussed common side effects including dry mouth and constant patient. PA follow-up 1 month for symptom reassessment and PVR  Glendia JAYSON Barba, MD  [facility] [address], [address] [place], [place] [address] [phone]    [1]  Allergies Allergen Reactions   Citrullus Vulgaris Other (See Comments)    Throat itchy, watermelons   Latex Rash        Shellfish Allergy Anaphylaxis    Hard  shellfish/swelling in throat   Sulfa Antibiotics Anaphylaxis    Swelling in throat.   Sulfonamide Derivatives Anaphylaxis    Swelling in throat.   Misc. Sulfonamide Containing Compounds    Penicillins Itching   Soy Allergy (Obsolete)     Diarrhea/upset stomach   Tomato Itching    Throat itchy - also Raw carrots   Other Swelling   "

## 2024-08-18 ENCOUNTER — Ambulatory Visit: Admitting: *Deleted

## 2024-08-18 ENCOUNTER — Encounter: Payer: Self-pay | Admitting: *Deleted

## 2024-08-18 DIAGNOSIS — Z87891 Personal history of nicotine dependence: Secondary | ICD-10-CM | POA: Diagnosis not present

## 2024-08-18 NOTE — Progress Notes (Signed)
 Virtual Visit via Telephone Note  I connected with [name] on 08/18/24 at 11:00 AM EST by telephone and verified that I am speaking with the correct person using two identifiers.  Location: Patient: at home Provider: [address], [place], [place], [address]   I discussed the limitations, risks, security and privacy concerns of performing an evaluation and management service by telephone and the availability of in person appointments. I also discussed with the patient that there may be a patient responsible charge related to this service. The patient expressed understanding and agreed to proceed.      Shared Decision Making Visit Lung Cancer Screening Program [phone])   Eligibility: Age 63 y.o. Pack Years Smoking History Calculation 95 (# packs/per year x # years smoked) Recent History of coughing up blood  no Unexplained weight loss? no ( >Than 15 pounds within the last 6 months ) Prior History Lung / other cancer no (Diagnosis within the last 5 years already requiring surveillance chest CT Scans). Smoking Status Former Smoker Former Smokers: Years since quit: 12 years  Quit Date: 07/2012  Visit Components: Discussion included one or more decision making aids. yes Discussion included risk/benefits of screening. yes Discussion included potential follow up diagnostic testing for abnormal scans. yes Discussion included meaning and risk of over diagnosis. yes Discussion included meaning and risk of False Positives. yes Discussion included meaning of total radiation exposure. yes  Counseling Included: Importance of adherence to annual lung cancer LDCT screening. yes Impact of comorbidities on ability to participate in the program. yes Ability and willingness to under diagnostic treatment. yes  Smoking Cessation Counseling: Current Smokers:  Discussed importance of smoking cessation. yes Information about tobacco cessation classes and interventions provided to  patient. yes Patient provided with ticket for LDCT Scan. no Symptomatic Patient. no  Counseling(Intermediate counseling: > three minutes) [address] Diagnosis Code: Tobacco Use Z72.0 Asymptomatic Patient yes  Counseled patient 4 minutes regarding tobacco use.   Former Smokers:  Discussed the importance of maintaining cigarette abstinence. yes Diagnosis Code: Personal History of Nicotine Dependence. S12.108 Information about tobacco cessation classes and interventions provided to patient. Yes Patient provided with ticket for LDCT Scan. no Written Order for Lung Cancer Screening with LDCT placed in Epic. Yes (CT Chest Lung Cancer Screening Low Dose W/O CM) [id number] Z12.2-Screening of respiratory organs Z87.891-Personal history of nicotine dependence   Laneta Speaks, RN

## 2024-08-18 NOTE — Patient Instructions (Signed)
 Thank you for participating in the [place] [facility] Cancer Screening Program. It was our pleasure to meet you today. We will call you with the results of your scan within the next few days. Your scan will be assigned a Lung RADS category score by the physicians reading the scans.  This Lung RADS score determines follow up scanning.  See below for description of categories, and follow up screening recommendations. We will be in touch to schedule your follow up screening annually or based on recommendations of our providers. We will fax a copy of your scan results to your Primary Care Physician, or the physician who referred you to the program, to ensure they have the results. Please call the office if you have any questions or concerns regarding your scanning experience or results.  Our office number is [phone]. Please speak with Karna Curly, RN., Karna Doom RN, or [facility] RN, and Isaiah Dover RN. They are  our Lung Cancer Screening RN.'s If They are unavailable when you call, Please leave a message on the voice mail. We will return your call at our earliest convenience.This voice mail is monitored several times a day.  Remember, if your scan is normal, we will scan you annually as long as you continue to meet the criteria for the program. (Age 70-80, Current smoker or smoker who has quit within the last 15 years). If you are a smoker, remember, quitting is the single most powerful action that you can take to decrease your risk of lung cancer and other pulmonary, breathing related problems. We know quitting is hard, and we are here to help.  Please let us  know if there is anything we can do to help you meet your goal of quitting. If you are a former smoker, counselling psychologist. We are proud of you! Remain smoke free! Remember you can refer friends or family members through the number above.  We will screen them to make sure they meet criteria for the program. Thank you for helping us   take better care of you by participating in Lung Screening.  For Virtual Smoking Cessation Classes , The American Lung Association Provides  Freedom From Smoking Classes.  Please search their website for dates and times.    Lung RADS Categories:  Lung RADS 1: no nodules or definitely non-concerning nodules.  Recommendation is for a repeat annual scan in 12 months.  Lung RADS 2:  nodules that are non-concerning in appearance and behavior with a very low likelihood of becoming an active cancer. Recommendation is for a repeat annual scan in 12 months.  Lung RADS 3: nodules that are probably non-concerning , includes nodules with a low likelihood of becoming an active cancer.  Recommendation is for a 78-month repeat screening scan. Often noted after an upper respiratory illness. We will be in touch to make sure you have no questions, and to schedule your 10-month scan.  Lung RADS 4 A: nodules with concerning findings, recommendation is most often for a follow up scan in 3 months or additional testing based on our provider's assessment of the scan. We will be in touch to make sure you have no questions and to schedule the recommended 3 month follow up scan.  Lung RADS 4 B:  indicates findings that are concerning. We will be in touch with you to schedule additional diagnostic testing based on our provider's  assessment of the scan.  You can receive free nicotine  replacement therapy ( patches, gum or mints) by calling 1-800-QUIT NOW.  Please call so we can get you on the path to becoming  a non-smoker. I know it is hard, but you can do this!  Other options for assistance in smoking cessation ( As covered by your insurance benefits)  Hypnosis for smoking cessation  [employer] [phone]  Acupuncture for smoking cessation  [employer] [phone]

## 2024-08-19 ENCOUNTER — Ambulatory Visit: Payer: Medicare HMO

## 2024-08-19 DIAGNOSIS — I5022 Chronic systolic (congestive) heart failure: Secondary | ICD-10-CM

## 2024-08-19 DIAGNOSIS — E118 Type 2 diabetes mellitus with unspecified complications: Secondary | ICD-10-CM

## 2024-08-19 DIAGNOSIS — E1169 Type 2 diabetes mellitus with other specified complication: Secondary | ICD-10-CM

## 2024-08-19 DIAGNOSIS — Z0000 Encounter for general adult medical examination without abnormal findings: Secondary | ICD-10-CM

## 2024-08-19 MED ORDER — EZETIMIBE 10 MG PO TABS: 10.0000 mg | ORAL_TABLET | Freq: Every day | ORAL | 3 refills | Status: AC

## 2024-08-19 MED ORDER — FARXIGA 10 MG PO TABS: 10.0000 mg | ORAL_TABLET | Freq: Every day | ORAL | 11 refills | Status: AC

## 2024-08-19 NOTE — Patient Instructions (Signed)
 [name],  Thank you for taking the time for your Medicare Wellness Visit. I appreciate your continued commitment to your health goals. Please review the care plan we discussed, and feel free to reach out if I can assist you further.  Please note that Annual Wellness Visits do not include a physical exam. Some assessments may be limited, especially if the visit was conducted virtually. If needed, we may recommend an in-person follow-up with your provider.  Ongoing Care Seeing your primary care provider every 3 to 6 months helps us  monitor your health and provide consistent, personalized care. 6/16 @10   Referrals If a referral was made during today's visit and you haven't received any updates within two weeks, please contact the referred provider directly to check on the status.  Recommended Screenings:  Health Maintenance  Topic Date Due   COVID-19 Vaccine (3 - Pfizer risk series) 02/21/2020   Complete foot exam   07/02/2024   Medicare Annual Wellness Visit  08/17/2024   Flu Shot  10/25/2024*   Zoster (Shingles) Vaccine (1 of 2) 10/30/2024*   Screening for Lung Cancer  03/23/2025*   Kidney health urinalysis for diabetes  09/23/2024   Hemoglobin A1C  09/23/2024   Breast Cancer Screening  11/12/2024   Eye exam for diabetics  06/15/2025   Colon Cancer Screening  07/15/2025   Yearly kidney function blood test for diabetes  08/05/2025   DTaP/Tdap/Td vaccine (2 - Td or Tdap) 05/04/2029   Pneumococcal Vaccine for age over 31  Completed   Osteoporosis screening with Bone Density Scan  Completed   Hepatitis C Screening  Completed   Meningitis B Vaccine  Aged Out  *Topic was postponed. The date shown is not the original due date.       01/15/2024   10:38 AM  Advanced Directives  Does Patient Have a Medical Advance Directive? No  Would patient like information on creating a medical advance directive? No - Patient declined    Vision: Annual vision screenings are recommended for early  detection of glaucoma, cataracts, and diabetic retinopathy. These exams can also reveal signs of chronic conditions such as diabetes and high blood pressure.  Dental: Annual dental screenings help detect early signs of oral cancer, gum disease, and other conditions linked to overall health, including heart disease and diabetes.  Please see the attached documents for additional preventive care recommendations.

## 2024-08-19 NOTE — Addendum Note (Signed)
 Addended by: Kealy Lewter, NIKKI N on: 08/19/2024 11:37 AM   Modules accepted: Orders

## 2024-08-19 NOTE — Progress Notes (Signed)
 "  Chief Complaint  Patient presents with   Medicare Wellness     Subjective:   Rosaura Bolon is a 64 y.o. female who presents for a Medicare Annual Wellness Visit.  Visit info / Clinical Intake: Medicare Wellness Visit Type:: Subsequent Annual Wellness Visit Persons participating in visit and providing information:: patient Medicare Wellness Visit Mode:: Video Since this visit was completed virtually, some vitals may be partially provided or unavailable. Missing vitals are due to the limitations of the virtual format.: Unable to obtain vitals - no equipment If Telephone or Video please confirm:: I connected with patient using audio/video enable telemedicine. I verified patient identity with two identifiers, discussed telehealth limitations, and patient agreed to proceed. Patient Location:: Home Provider Location:: Home Interpreter Needed?: No Pre-visit prep was completed: yes AWV questionnaire completed by patient prior to visit?: no Living arrangements:: (!) lives alone Patient's Overall Health Status Rating: good Typical amount of pain: some Does pain affect daily life?: no Are you currently prescribed opioids?: no  Dietary Habits and Nutritional Risks How many meals a day?: 3 Eats fruit and vegetables daily?: yes Most meals are obtained by: preparing own meals In the last 2 weeks, have you had any of the following?: (!) nausea, vomiting, diarrhea; unintentional weight loss (had food poisoning) Diabetic:: (!) yes Any non-healing wounds?: no How often do you check your BS?: 2 Would you like to be referred to a Nutritionist or for Diabetic Management? : yes (nutritionist)  Functional Status Activities of Daily Living (to include ambulation/medication): Independent Ambulation: Independent Medication Administration: Independent Home Management (perform basic housework or laundry): Independent Manage your own finances?: yes Primary transportation is: driving Concerns about  vision?: no *vision screening is required for WTM* Concerns about hearing?: no  Fall Screening Falls in the past year?: 0 Number of falls in past year: 0 Was there an injury with Fall?: 0 Fall Risk Category Calculator: 0 Patient Fall Risk Level: Low Fall Risk  Fall Risk Patient at Risk for Falls Due to: No Fall Risks Fall risk Follow up: Falls evaluation completed  Home and Transportation Safety: All rugs have non-skid backing?: N/A, no rugs All stairs or steps have railings?: yes Grab bars in the bathtub or shower?: yes Have non-skid surface in bathtub or shower?: yes Good home lighting?: yes Regular seat belt use?: yes Hospital stays in the last year:: no  Cognitive Assessment Difficulty concentrating, remembering, or making decisions? : no Will 6CIT or Mini Cog be Completed: yes What year is it?: 0 points What month is it?: 0 points Give patient an address phrase to remember (5 components): [address] [place] About what time is it?: 0 points Count backwards from 20 to 1: 0 points Say the months of the year in reverse: 2 points Repeat the address phrase from earlier: 0 points 6 CIT Score: 2 points  Advance Directives (For Healthcare) Does Patient Have a Medical Advance Directive?: No Would patient like information on creating a medical advance directive?: No - Patient declined    Allergies (verified) Citrullus vulgaris, Latex, Shellfish allergy, Sulfa antibiotics, Sulfonamide derivatives, Misc. sulfonamide containing compounds, Penicillins, Soy allergy (obsolete), Tomato, and Other   Current Medications (verified) Outpatient Encounter Medications as of 08/19/2024  Medication Sig   acetaminophen  (TYLENOL  8 HOUR) 650 MG CR tablet Take 2 tablets (1,300 mg total) by mouth 2 (two) times daily as needed for pain.   albuterol  (VENTOLIN  HFA) 108 (90 Base) MCG/ACT inhaler Inhale 2 puffs into the lungs every 6 (  six) hours as needed for wheezing or shortness of breath.    Alirocumab  (PRALUENT ) 75 MG/ML SOAJ Inject 1 mL (75 mg total) into the skin every 14 (fourteen) days.   aspirin  EC 81 MG tablet Take 1 tablet (81 mg total) by mouth daily.   Azelastine  HCl 0.15 % SOLN Place 2 sprays into both nostrils daily as needed (allergies).   EPINEPHrine  0.3 mg/0.3 mL IJ SOAJ injection Inject one pen into the thigh as needed for anaphylactic reaction. Then CALL 911. Use second pen if needed thereafter   esomeprazole  (NEXIUM ) 40 MG capsule Take 1 capsule (40 mg total) by mouth daily.   ezetimibe  (ZETIA ) 10 MG tablet Take 1 tablet (10 mg total) by mouth daily.   FARXIGA  10 MG TABS tablet Take 1 tablet (10 mg total) by mouth daily before breakfast.   fluticasone  (FLONASE ) 50 MCG/ACT nasal spray Place 2 sprays into both nostrils daily. Prn nasal congestion   Fluticasone -Umeclidin-Vilant (TRELEGY ELLIPTA ) 200-62.5-25 MCG/ACT AEPB Inhale 1 puff into the lungs daily.   Fluticasone -Umeclidin-Vilant (TRELEGY ELLIPTA ) 200-62.5-25 MCG/ACT AEPB Inhale 1 Act into the lungs daily.   furosemide  (LASIX ) 20 MG tablet TAKE 1 TABLET BY MOUTH DAILY AND 1 TABLET AT LUNCH 3 TIMES A WEEK   hydrocortisone  cream 1 % Apply topically 2 (two) times daily.   hydrOXYzine  (ATARAX ) 25 MG tablet Take 1 tablet (25 mg total) by mouth daily as needed for anxiety.   ipratropium-albuterol  (DUONEB) 0.5-2.5 (3) MG/3ML SOLN USE 1 VIAL IN NEBULIZER TWICE DAILY AS NEEDED FOR WHEEZING FOR SHORTNESS OF BREATH   isosorbide  mononitrate (IMDUR ) 30 MG 24 hr tablet Take 1 tablet (30 mg total) by mouth 2 (two) times daily.   metoprolol  succinate (TOPROL -XL) 50 MG 24 hr tablet Take 1 tablet (50 mg total) by mouth daily.   montelukast  (SINGULAIR ) 10 MG tablet Take 1 tablet (10 mg total) by mouth at bedtime.   nitroGLYCERIN  (NITROSTAT ) 0.4 MG SL tablet DISSOLVE ONE TABLET UNDER THE TONGUE EVERY 5 MINUTES AS NEEDED FOR CHEST PAIN.  DO NOT EXCEED A TOTAL OF 3 DOSES IN 15 MINUTES   olopatadine  (PATANOL) 0.1 % ophthalmic solution  INSTILL 1 DROP INTO EACH EYE TWICE DAILY   ondansetron  (ZOFRAN -ODT) 4 MG disintegrating tablet Take 1 tablet (4 mg total) by mouth every 8 (eight) hours as needed for nausea or vomiting.   PARoxetine  (PAXIL ) 20 MG tablet Take 1 tablet (20 mg total) by mouth daily.   potassium chloride  (KLOR-CON  M) 10 MEQ tablet Take 1 tablet (10 mEq total) by mouth daily.   [START ON 09/12/2024] pregabalin  (LYRICA ) 75 MG capsule Take 1 capsule (75 mg total) by mouth 2 (two) times daily.   rivaroxaban  (XARELTO ) 2.5 MG TABS tablet Take 1 tablet (2.5 mg total) by mouth 2 (two) times daily.   rosuvastatin  (CRESTOR ) 20 MG tablet Take 1 tablet (20 mg total) by mouth at bedtime.   sodium chloride  (OCEAN) 0.65 % SOLN nasal spray Place 2 sprays into both nostrils daily as needed for congestion.   Spacer/Aero-Holding Chambers (AEROCHAMBER MV) inhaler Use as instructed   solifenacin  (VESICARE ) 5 MG tablet Take 1 tablet (5 mg total) by mouth daily.   No facility-administered encounter medications on file as of 08/19/2024.    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 being followed by neurosurgery/eye doctor and pcp. monitoring size.causes sinus problems   Breast mass, right 01/14/2021   Bronchitis    Bunion of right foot 08/26/2018   Cardiac arrhythmia    CCF (congestive cardiac failure) (HCC) 06/19/2015   Cervical neck pain with evidence of disc disease    C5/6 disease,  MRI done late 2012 - no records available   Chronic combined systolic and diastolic CHF (congestive heart failure) (HCC)    a. 08/2010 Echo: mildly reduced EF 40-45%, mild diffuse hypokinesis; b. 06/2016 Echo: EF 50%, no rwma, mild to mod TR; c. 03/2017 Echo: EF 40-45%, Gr1 DD (prior echo reviewed and EF felt to be lower than reported).   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 12/2018 PFT: No obvious obst/restrictive dzs.   COPD exacerbation (HCC) 03/12/2022   Coronary artery disease    a. PCI of LCX 2003; b. PCI of the LAD 2012 with a (2.5 x 8 mm BMS);  c.s/p CABG 4/12: L-LAD, VG-Dx, VG-OM, VG-RCA (Dr. Fleeta Ochoa);  d. 01/2012 MV: inf infarct, attenuation, no ischemia; e. 02/2017 MV: signifi attenuation artifact. Fixed basal antlat/inflat scar vs artifact. Reversible apical lat and mid antlat defect - ? atten vs ischemia. F/u echo w/o wma->Med rx.   Coronary artery disease involving native coronary artery of native heart without angina pectoris 08/23/2010   Depression    Depression    Diabetes mellitus without complication [facility])    Fall 05/14/2018   Family history of colon cancer    Family history of colon cancer 06/19/2015   Gastritis without bleeding    Generalized headaches    frequent   GERD (gastroesophageal reflux disease)    Headache    Heart murmur    Hematochezia    Hematochezia 01/11/2015   Hepatitis    history of hepatitis b   History of cervical cancer    s/p cryotherapy   History of cervical cancer    s/p cryotherapy   History of colonic polyps 01/11/2015   History of drug abuse (HCC)    cocaine, marijuana, clean since 1989   History of drug abuse (HCC)    cocaine, marijuana,  clean since 1989   History of hepatitis B    from eating undercooked liver   History of MI (myocardial infarction)    HTN (hypertension) 12/20/2011   Hyperlipidemia    Hypertension    Insomnia 04/19/2020   Intractable vomiting with nausea    Intractable vomiting with nausea    Ischemic cardiomyopathy    a. 03/2017 Echo: EF 40-45%, Gr1 DD.   Lipoma of right lower extremity 08/30/2020   Mild intermittent asthma 10/13/2019  Myocardial infarction [facility]) 2003, 2012   Need for pneumococcal 20-valent conjugate vaccination 07/06/2022   Numbness and tingling in right hand 02/24/2018   Orthostatic hypotension 04/30/2015   PAD (peripheral artery disease)    a. s/p Right SFA atherectomy and PTA 01/15/11; b. 07/2018 ABI: R 1.02, L 1.09.   Pituitary mass    a. 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 Mass abuts R cavernous sinus w/o definite invasion.   PND (post-nasal drip) 07/24/2021   Polyp of colon    Pruritus of both eyes 09/21/2020   Right knee pain 12/20/2011   Abnormal MRI right knee 08/29/2016 f/u [facility]      FINDINGS: Medial compartment: [There is abnormal linear signal within the medial meniscus near junction of posterior horn and mid body which abuts the tibial undersurface consistent with nondisplaced tear (5:8-10). The articular cartilage, and subchondral bone are normal. 3 mm bone island (7:14).  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   Seasonal allergies    Shortness of breath 08/23/2010   Qualifier: Diagnosis of   By: Claudene RN, Megan       Skin lesion 04/19/2021   Sleep apnea    uses cpap   Smoking history    quit 07/2010   Smoking history 12/16/2010   Quit 07/2010   Status post total knee replacement using cement, right 04/14/2019   Thrush 04/19/2021   Thyroid  disease    Urinary incontinence    Urinary incontinence 12/18/2017   Vaginal itching 04/19/2021   Vasomotor flushing 11/24/2016   Past Surgical History:   Procedure Laterality Date   ABDOMINAL HYSTERECTOMY     2003   CHOLECYSTECTOMY  1986   COLONOSCOPY  2008   3 polyps   COLONOSCOPY WITH PROPOFOL  N/A 02/06/2015   Procedure: COLONOSCOPY WITH PROPOFOL ;  Surgeon: Rogelia Copping, MD;  Location: [facility] ENDOSCOPY;  Service: Endoscopy;  Laterality: N/A;   COLONOSCOPY WITH PROPOFOL  N/A 01/27/2017   Procedure: COLONOSCOPY WITH PROPOFOL ;  Surgeon: Copping Rogelia, MD;  Location: [facility] ENDOSCOPY;  Service: Endoscopy;  Laterality: N/A;   COLONOSCOPY WITH PROPOFOL  N/A 07/15/2022   Procedure: COLONOSCOPY WITH PROPOFOL ;  Surgeon: Unk Corinn Skiff, MD;  Location: [facility] ENDOSCOPY;  Service: Gastroenterology;  Laterality: N/A;   CORONARY ANGIOPLASTY     w/ stent placement x2   CORONARY ARTERY BYPASS GRAFT  2012   Dr Perla   CORONARY STENT PLACEMENT  2003   S/P MI   CORONARY STENT PLACEMENT  2007   [place]   ESOPHAGOGASTRODUODENOSCOPY (EGD) WITH PROPOFOL  N/A 02/06/2015   Procedure: ESOPHAGOGASTRODUODENOSCOPY (EGD) WITH PROPOFOL ;  Surgeon: Rogelia Copping, MD;  Location: [facility] ENDOSCOPY;  Service: Endoscopy;  Laterality: N/A;   ESOPHAGOGASTRODUODENOSCOPY (EGD) WITH PROPOFOL  N/A 01/27/2017   Procedure: ESOPHAGOGASTRODUODENOSCOPY (EGD) WITH PROPOFOL ;  Surgeon: Copping Rogelia, MD;  Location: [facility] ENDOSCOPY;  Service: Endoscopy;  Laterality: N/A;   ESOPHAGOGASTRODUODENOSCOPY (EGD) WITH PROPOFOL  N/A 09/01/2019   Procedure: ESOPHAGOGASTRODUODENOSCOPY (EGD) WITH PROPOFOL ;  Surgeon: Unk Corinn Skiff, MD;  Location: [facility] ENDOSCOPY;  Service: Gastroenterology;  Laterality: N/A;   FEMORAL ARTERY STENT  10/2010   right sided (Dr. Wonda)   KNEE ARTHROSCOPY WITH MEDIAL MENISECTOMY Right 08/20/2017   Procedure: KNEE ARTHROSCOPY WITH MEDIAL  AND LATERAL MENISECTOMY;  Surgeon: Kathlynn Sharper, MD;  Location: [facility] ORS;  Service: Orthopedics;  Laterality: Right;   POSTERIOR CERVICAL LAMINECTOMY N/A 03/08/2018   Procedure: POSTERIOR CERVICAL LAMINECTOMY-C7;  Surgeon: Bluford Standing, MD;  Location:  [facility] ORS;  Service: Neurosurgery;  Laterality: N/A;   TONSILLECTOMY     TOTAL KNEE ARTHROPLASTY Right 04/14/2019   Procedure: RIGHT TOTAL KNEE ARTHROPLASTY;  Surgeon: Kathlynn Sharper, MD;  Location: [facility] ORS;  Service: Orthopedics;  Laterality: Right;   TUBAL LIGATION     Family History  Problem Relation Age of Onset   Other Mother    Breast cancer Mother 93       breast cancer, late 22's   Cancer Mother        breast   Hypertension Father    Heart failure Father    Diabetes Father    Colon cancer Father 65   Glaucoma Father    Cancer Father        colorectal    Heart disease Father    Other Father        glaucoma   Brain cancer Sister    Arthritis Sister    Diabetes Sister    Hypertension Sister    Kidney disease Sister    Diabetes Brother    Hypertension Brother    Acute myelogenous leukemia Grandson        06/2019   Coronary artery disease Neg Hx    Stroke Neg Hx    Social History   Occupational History   Not on file  Tobacco Use   Smoking status: Former    Current packs/day: 0.00    Average packs/day: 2.5 packs/day for 38.0 years (95.0 ttl pk-yrs)    Types: Cigarettes    Start date: 08/12/1974    Quit date: 08/12/2012    Years since quitting: 12.0   Smokeless tobacco: Never  Vaping Use   Vaping status: Never Used  Substance and Sexual Activity   Alcohol use: No   Drug use: No    Types: Cocaine    Comment: Remote Hx (crack cocaine and marijuana).none since 50yrs plus   Sexual activity: Yes   Tobacco Counseling Counseling given: Not Answered  SDOH Screenings   Food Insecurity: No Food Insecurity (08/19/2024)  Housing: Low Risk (08/19/2024)  Transportation Needs: No Transportation Needs (08/19/2024)  Utilities: Not At Risk (08/19/2024)  Alcohol Screen: Low Risk (08/18/2023)  Depression (PHQ2-9): Low Risk (08/19/2024)  Financial Resource Strain: High Risk (07/29/2024)  Physical Activity: Insufficiently Active (08/19/2024)  Social Connections: Moderately  Integrated (08/19/2024)  Recent Concern: Social Connections - Moderately Isolated (07/29/2024)  Stress: No Stress Concern Present (08/19/2024)  Recent Concern: Stress - Stress Concern Present (07/29/2024)  Tobacco Use: Medium Risk (08/18/2024)  Health Literacy: Adequate Health Literacy (08/19/2024)   See flowsheets for full screening details  Depression Screen PHQ 2 & 9 Depression Scale- Over the past 2 weeks, how often have you been bothered by any of the following problems? Little interest or pleasure in doing things: 0 Feeling down, depressed, or hopeless (PHQ Adolescent also includes...irritable): 0 PHQ-2 Total Score: 0 Trouble falling or staying asleep, or sleeping too much: 0 Feeling tired or having little energy: 1 Poor appetite or overeating (PHQ Adolescent also includes...weight loss): 0 Feeling bad about yourself - or that you are a failure or have let yourself or your family down: 0 Trouble concentrating on things, such as reading the newspaper or watching television (PHQ Adolescent also includes...like school work): 1 Moving or speaking so slowly that other people could have noticed. Or the opposite - being so fidgety or restless that you have been moving around a lot more than usual: 0 Thoughts that you would be better off dead, or of hurting yourself  in some way: 0 PHQ-9 Total Score: 2 If you checked off any problems, how difficult have these problems made it for you to do your work, take care of things at home, or get along with other people?: Not difficult at all     Goals Addressed             This Visit's Progress    trying to do chair yoga and dance fit. Keep it moving.               Objective:    There were no vitals filed for this visit. There is no height or weight on file to calculate BMI.  Hearing/Vision screen No results found. Immunizations and Health Maintenance Health Maintenance  Topic Date Due   FOOT EXAM  07/02/2024   Medicare Annual Wellness  (AWV)  08/17/2024   COVID-19 Vaccine (3 - Pfizer risk series) 09/04/2024 (Originally 02/21/2020)   Influenza Vaccine  10/25/2024 (Originally 02/26/2024)   Zoster Vaccines- Shingrix (1 of 2) 10/30/2024 (Originally 08/14/1979)   Lung Cancer Screening  03/23/2025 (Originally 03/12/2023)   Diabetic kidney evaluation - Urine ACR  09/23/2024   HEMOGLOBIN A1C  09/23/2024   Mammogram  11/12/2024   OPHTHALMOLOGY EXAM  06/15/2025   Colonoscopy  07/15/2025   Diabetic kidney evaluation - eGFR measurement  08/05/2025   DTaP/Tdap/Td (2 - Td or Tdap) 05/04/2029   Pneumococcal Vaccine: 50+ Years  Completed   Bone Density Scan  Completed   Hepatitis C Screening  Completed   Meningococcal B Vaccine  Aged Out        Assessment/Plan:  This is a routine wellness examination for Tameyah.  Patient Care Team: Bair, Kalpana, MD as PCP - General (Family Medicine) Perla, Evalene PARAS, MD as PCP - Cardiology (Cardiology) Green, Davina E, RN as [facility], [facility]) Isaiah Scrivener, MD as Consulting Physician (Pulmonary Disease)  I have personally reviewed and noted the following in the patients chart:   Medical and social history Use of alcohol, tobacco or illicit drugs  Current medications and supplements including opioid prescriptions. Functional ability and status Nutritional status Physical activity Advanced directives List of other physicians Hospitalizations, surgeries, and ER visits in previous 12 months Vitals Screenings to include cognitive, depression, and falls Referrals and appointments  No orders of the defined types were placed in this encounter.  In addition, I have reviewed and discussed with patient certain preventive protocols, quality metrics, and best practice recommendations. A written personalized care plan for preventive services as well as general preventive health recommendations were provided to patient.   Arnette LOISE Hoots,  CMA   08/19/2024   No follow-ups on file.  After Visit Summary: (Declined) Due to this being a telephonic visit, with patients personalized plan was offered to patient but patient Declined AVS at this time   Nurse Notes: Patient is doing well.  "

## 2024-08-23 ENCOUNTER — Ambulatory Visit
Admission: RE | Admit: 2024-08-23 | Discharge: 2024-08-23 | Disposition: A | Discharge status: Home / Self Care | Source: Ambulatory Visit | Attending: Acute Care | Admitting: Acute Care

## 2024-08-23 DIAGNOSIS — Z87891 Personal history of nicotine dependence: Secondary | ICD-10-CM | POA: Insufficient documentation

## 2024-08-23 DIAGNOSIS — Z122 Encounter for screening for malignant neoplasm of respiratory organs: Secondary | ICD-10-CM | POA: Insufficient documentation

## 2024-08-24 ENCOUNTER — Other Ambulatory Visit: Payer: Self-pay

## 2024-08-24 DIAGNOSIS — Z1231 Encounter for screening mammogram for malignant neoplasm of breast: Secondary | ICD-10-CM

## 2024-08-25 ENCOUNTER — Ambulatory Visit: Payer: Self-pay

## 2024-08-25 ENCOUNTER — Other Ambulatory Visit: Payer: Self-pay

## 2024-08-25 DIAGNOSIS — Z87891 Personal history of nicotine dependence: Secondary | ICD-10-CM

## 2024-08-25 DIAGNOSIS — Z122 Encounter for screening for malignant neoplasm of respiratory organs: Secondary | ICD-10-CM

## 2024-08-25 NOTE — Telephone Encounter (Signed)
 This encounter was created in error - please disregard.

## 2024-08-25 NOTE — Telephone Encounter (Signed)
 Spoke with pt and she stated that she was not able to go to UC today so pt has been scheduled to see Vincente, NP tomorrow at 11:20.

## 2024-08-25 NOTE — Telephone Encounter (Signed)
" ° °  Reason for Triage: Coughing up dark phlegm, dark yellow, dark brown, asking for antibiotics to be called in. Pt thinks it's just sinus drainage.   [phone] (M)  "

## 2024-08-25 NOTE — Telephone Encounter (Signed)
 Attempt #2- no answer, LVM    Reason for Triage: Coughing up dark phlegm, dark yellow, dark brown, asking for antibiotics to be called in. Pt thinks it's just sinus drainage.  [phone] (M)

## 2024-08-25 NOTE — Telephone Encounter (Signed)
 FYI Only or Action Required?: FYI only for provider: to UC (no appts).  Patient was last seen in primary care on 08/01/2024 by Abbey Bruckner, MD.  Called Nurse Triage reporting Cough.  Symptoms began 08/19/23.  Interventions attempted albuterol  inhaler.  Symptoms are: unchanged.  Triage Disposition: See HCP Within 4 Hours (Or PCP Triage)  Patient/caregiver understands and will follow disposition?: yes       Reason for Disposition  [1] MILD difficulty breathing (e.g., minimal/no SOB at rest, SOB with walking, pulse < 100) AND [2] still present when not coughing  Answer Assessment - Initial Assessment Questions 1. ONSET: When did the cough begin?      08/19/23 2. SEVERITY: How bad is the cough today?      Deep - freq. Cough  3. SPUTUM: Describe the color of your sputum (e.g., none, dry cough; clear, white, yellow, green)     White to dk brown or beige  4. HEMOPTYSIS: Are you coughing up any blood? If Yes, ask: How much? (e.g., flecks, streaks, tablespoons, etc.)     no 5. DIFFICULTY BREATHING: Are you having difficulty breathing? If Yes, ask: How bad is it? (e.g., mild, moderate, severe)      Mild  6. FEVER: Do you have a fever? If Yes, ask: What is your temperature, how was it measured, and when did it start?     fever 7. CARDIAC HISTORY: Do you have any history of heart disease? (e.g., heart attack, congestive heart failure)      CABG x 4  8. LUNG HISTORY: Do you have any history of lung disease?  (e.g., pulmonary embolus, asthma, emphysema)     COPD 10. OTHER SYMPTOMS: Do you have any other symptoms? (e.g., runny nose, wheezing, chest pain)       Sinus congestion, post nasal drainage, yellow tinged mucous  Protocols used: Cough - Acute Non-Productive-A-AH

## 2024-08-26 ENCOUNTER — Ambulatory Visit (INDEPENDENT_AMBULATORY_CARE_PROVIDER_SITE_OTHER): Admitting: Nurse Practitioner

## 2024-08-26 ENCOUNTER — Encounter: Payer: Self-pay | Admitting: Nurse Practitioner

## 2024-08-26 VITALS — BP 120/78 | HR 71 | Temp 97.6°F | Ht 65.0 in | Wt 192.4 lb

## 2024-08-26 DIAGNOSIS — J069 Acute upper respiratory infection, unspecified: Secondary | ICD-10-CM | POA: Diagnosis not present

## 2024-08-26 MED ORDER — FLUTICASONE PROPIONATE 50 MCG/ACT NA SUSP: 2.0000 | Freq: Every day | NASAL | 2 refills | Status: AC

## 2024-08-26 MED ORDER — GUAIFENESIN ER 600 MG PO TB12: 600.0000 mg | ORAL_TABLET | Freq: Two times a day (BID) | ORAL | 0 refills | Status: AC

## 2024-08-26 MED ORDER — DOXYCYCLINE HYCLATE 100 MG PO TABS: 100.0000 mg | ORAL_TABLET | Freq: Two times a day (BID) | ORAL | 0 refills | Status: AC

## 2024-08-26 NOTE — Patient Instructions (Addendum)
 YOUR PLAN:  URI with cough and congestion: You have a persistent cough with postnasal drip, sinus pressure, and nasal congestion. Your symptoms are likely related to your allergies and chronic sinusitis. -Take plain Mucinex  to help thin your secretions. -Take doxycycline  with food and probiotics. -Use Flonase  nasal spray for congestion. -Increase your fluid intake and try steam inhalation. -Seek medical attention if you experience shortness of breath or chest pain. -Monitor your symptoms and seek evaluation if there is no improvement or if symptoms worsen.

## 2024-08-27 ENCOUNTER — Other Ambulatory Visit: Payer: Self-pay

## 2024-08-27 DIAGNOSIS — R42 Dizziness and giddiness: Secondary | ICD-10-CM

## 2024-09-01 ENCOUNTER — Encounter: Payer: Self-pay | Admitting: Internal Medicine

## 2024-09-01 ENCOUNTER — Ambulatory Visit: Admitting: Internal Medicine

## 2024-09-01 VITALS — BP 130/80 | HR 89 | Temp 98.5°F | Ht 65.0 in | Wt 198.6 lb

## 2024-09-01 DIAGNOSIS — G4733 Obstructive sleep apnea (adult) (pediatric): Secondary | ICD-10-CM

## 2024-09-01 DIAGNOSIS — Z87891 Personal history of nicotine dependence: Secondary | ICD-10-CM

## 2024-09-01 DIAGNOSIS — J4550 Severe persistent asthma, uncomplicated: Secondary | ICD-10-CM

## 2024-09-01 NOTE — Progress Notes (Signed)
 "  @Patient  ID: [name], female    DOB: [birth year], 64 y.o.   MRN: [id number]  SYNOPSIS 64 year old female, warmer smoker quit in 2012.  Past medical history significant for hypertension, heart failure, asthma/COPD, OSA on CPAP, allergic rhinitis, eczema, fatty liver, GERD, hyperlipidemia, type 2 diabetes, Vit D deficiency.HST 04/25/21 showed mild sleep apena, AHI 13.1/hour with SpO2 low 85%  EOS LEVELS 300 07/2023    CC Assessment of severe persistent asthma Assessment of OSA  HPI: Assessment severe persistent asthma-patient has refused biologic agents in the past and now has re-considered Immune Therapy  Continues to take Trelegy 200 Has had antibiotic and prednisone  multiple times in the past Patient is to follow-up with allergy immunology Patient with persistent shortness of breath persistent wheezing persistent exposure to animals cats dogs and Pecan  trees Patient is constantly having shortness of breath dyspnea exertion wheezing cough congestion   IgE levels in November 2024 were elevated at 424   Assessment of OSA Discussed sleep data and reviewed with patient.  Plan to start AUTOCPAP 4-8       Allergies  Allergen Reactions   Citrullus Vulgaris Other (See Comments)    Throat itchy, watermelons   Latex Rash        Shellfish Allergy Anaphylaxis    Hard shellfish/swelling in throat   Sulfa Antibiotics Anaphylaxis    Swelling in throat.   Sulfonamide Derivatives Anaphylaxis    Swelling in throat.   Misc. Sulfonamide Containing Compounds    Penicillins Itching   Soy Allergy (Obsolete)     Diarrhea/upset stomach   Tomato Itching    Throat itchy - also Raw carrots   Other Swelling    Immunization History  Administered Date(s) Administered   Influenza Split 05/22/2015, 04/21/2016   Influenza, Quadrivalent, Recombinant, Inj, Pf 05/27/2022   Influenza,inj,Quad PF,6+ Mos 04/05/2018, 03/29/2019, 04/19/2020, 04/19/2021   Influenza-Unspecified 05/20/2017,  05/27/2022, 06/08/2023   PFIZER(Purple Top)SARS-COV-2 Vaccination 01/03/2020, 01/24/2020   PNEUMOCOCCAL CONJUGATE-20 06/08/2023   RSV,unspecified 05/27/2022   Respiratory Syncytial Virus Vaccine,Recomb Aduvanted(Arexvy) 05/27/2022   Tdap 05/05/2019    Past Medical History:  Diagnosis Date   Abnormal feces    Abnormality of both breasts on screening mammogram 05/14/2020   Acute esophagogastric ulcer    Allergy    Anemia    Annual physical exam 01/14/2021   Anxiety and depression 04/05/2018   Arthritis    Asthma    Atypical chest pain 01/20/2014   Back ache 06/19/2015   Bacterial vaginitis    Benign breast cyst in female, right 02/22/2020   Benign neoplasm of ascending colon    Bilateral hip pain 04/19/2020   Blood in stool    Brachial neuritis or radiculitis NOS    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 being followed by neurosurgery/eye doctor and pcp. monitoring size.causes sinus problems   Breast mass, right 01/14/2021   Bronchitis    Bunion of right foot 08/26/2018   Cardiac arrhythmia    CCF (congestive cardiac failure) (HCC) 06/19/2015   Cervical neck pain with evidence of disc disease    C5/6 disease, MRI done late 2012 - no records available   Chronic combined systolic and diastolic CHF (congestive heart failure) (HCC)    a. 08/2010 Echo: mildly reduced EF 40-45%, mild diffuse hypokinesis; b. 06/2016 Echo: EF 50%, no rwma, mild to mod TR; c. 03/2017 Echo: EF 40-45%, Gr1 DD (prior echo reviewed and EF felt to be lower than reported).   Chronic neck pain  12/18/2017   Chronic pain    Chronic pain of inguinal region 07/24/2021   CLAUDICATION 08/23/2010   Qualifier: Diagnosis of   By: Claudene RN, Megan       Cocaine abuse, in remission (HCC)    clean x 24 years   Colon polyp 06/19/2015   2010   Colon polyps    COPD (chronic obstructive pulmonary disease) (HCC)    a. 12/2018 PFT: No obvious obst/restrictive dzs.   COPD exacerbation (HCC) 03/12/2022    Coronary artery disease    a. PCI of LCX 2003; b. PCI of the LAD 2012 with a (2.5 x 8 mm BMS);  c.s/p CABG 4/12: L-LAD, VG-Dx, VG-OM, VG-RCA (Dr. Fleeta Ochoa);  d. 01/2012 MV: inf infarct, attenuation, no ischemia; e. 02/2017 MV: signifi attenuation artifact. Fixed basal antlat/inflat scar vs artifact. Reversible apical lat and mid antlat defect - ? atten vs ischemia. F/u echo w/o wma->Med rx.   Coronary artery disease involving native coronary artery of native heart without angina pectoris 08/23/2010   Depression    Depression    Diabetes mellitus without complication [facility])    Fall 05/14/2018   Family history of colon cancer    Family history of colon cancer 06/19/2015   Gastritis without bleeding    Generalized headaches    frequent   GERD (gastroesophageal reflux disease)    Headache    Heart murmur    Hematochezia    Hematochezia 01/11/2015   Hepatitis    history of hepatitis b   History of cervical cancer    s/p cryotherapy   History of cervical cancer    s/p cryotherapy   History of colonic polyps 01/11/2015   History of drug abuse (HCC)    cocaine, marijuana, clean since 1989   History of drug abuse (HCC)    cocaine, marijuana, clean since 1989   History of hepatitis B    from eating undercooked liver   History of MI (myocardial infarction)    HTN (hypertension) 12/20/2011   Hyperlipidemia    Hypertension    Insomnia 04/19/2020   Intractable vomiting with nausea    Intractable vomiting with nausea    Ischemic cardiomyopathy    a. 03/2017 Echo: EF 40-45%, Gr1 DD.   Lipoma of right lower extremity 08/30/2020   Mild intermittent asthma 10/13/2019   Myocardial infarction [place]) 2003, 2012   Need for pneumococcal 20-valent conjugate vaccination 07/06/2022   Numbness and tingling in right hand 02/24/2018   Orthostatic hypotension 04/30/2015   PAD (peripheral artery disease)    a. s/p Right SFA atherectomy and PTA 01/15/11; b. 07/2018 ABI: R 1.02, L 1.09.   Pituitary mass     a. 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 Mass abuts R cavernous sinus w/o definite invasion.   PND (post-nasal drip) 07/24/2021   Polyp of colon    Pruritus of both eyes 09/21/2020   Right knee pain 12/20/2011   Abnormal MRI right knee 08/29/2016 f/u [facility]      FINDINGS: Medial compartment: [There is abnormal linear signal within the medial meniscus near junction of posterior horn and mid body which abuts the tibial undersurface consistent with nondisplaced tear (5:8-10). The articular cartilage, and subchondral bone are normal. 3 mm bone island (7:14).  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   Seasonal allergies    Shortness of breath 08/23/2010   Qualifier: Diagnosis of   By: Claudene RN, Duwaine  Skin lesion 04/19/2021   Sleep apnea    uses cpap   Smoking history    quit 07/2010   Smoking history 12/16/2010   Quit 07/2010   Status post total knee replacement using cement, right 04/14/2019   Thrush 04/19/2021   Thyroid  disease    Urinary incontinence    Urinary incontinence 12/18/2017   Vaginal itching 04/19/2021   Vasomotor flushing 11/24/2016    Tobacco History: Social History   Tobacco Use  Smoking Status Former   Current packs/day: 0.00   Average packs/day: 2.5 packs/day for 38.0 years (95.0 ttl pk-yrs)   Types: Cigarettes   Start date: 08/12/1974   Quit date: 08/12/2012   Years since quitting: 12.0  Smokeless Tobacco Never   Counseling given: Not Answered   Outpatient Medications Prior to Visit  Medication Sig Dispense Refill   acetaminophen  (TYLENOL  8 HOUR) 650 MG CR tablet Take 2 tablets (1,300 mg total) by mouth 2 (two) times daily as needed for pain. 60 tablet 1   albuterol  (VENTOLIN  HFA) 108 (90 Base) MCG/ACT inhaler Inhale 2 puffs into the lungs every 6 (six) hours as needed for wheezing or shortness of breath. 1 g 12   Alirocumab  (PRALUENT ) 75 MG/ML SOAJ Inject 1 mL (75 mg total) into the skin  every 14 (fourteen) days. 2 mL 12   aspirin  EC 81 MG tablet Take 1 tablet (81 mg total) by mouth daily. 90 tablet 3   Azelastine  HCl 0.15 % SOLN Place 2 sprays into both nostrils daily as needed (allergies). 30 mL 6   EPINEPHrine  0.3 mg/0.3 mL IJ SOAJ injection Inject one pen into the thigh as needed for anaphylactic reaction. Then CALL 911. Use second pen if needed thereafter 2 each 5   esomeprazole  (NEXIUM ) 40 MG capsule Take 1 capsule (40 mg total) by mouth daily. 90 capsule 3   ezetimibe  (ZETIA ) 10 MG tablet Take 1 tablet (10 mg total) by mouth daily. 90 tablet 3   FARXIGA  10 MG TABS tablet Take 1 tablet (10 mg total) by mouth daily before breakfast. 30 tablet 11   fluticasone  (FLONASE ) 50 MCG/ACT nasal spray Place 2 sprays into both nostrils daily. Prn nasal congestion 1 g 3   fluticasone  (FLONASE ) 50 MCG/ACT nasal spray Place 2 sprays into both nostrils daily. 16 g 2   Fluticasone -Umeclidin-Vilant (TRELEGY ELLIPTA ) 200-62.5-25 MCG/ACT AEPB Inhale 1 puff into the lungs daily.     Fluticasone -Umeclidin-Vilant (TRELEGY ELLIPTA ) 200-62.5-25 MCG/ACT AEPB Inhale 1 Act into the lungs daily. 28 each 3   furosemide  (LASIX ) 20 MG tablet TAKE 1 TABLET BY MOUTH DAILY AND 1 TABLET AT LUNCH 3 TIMES A WEEK 120 tablet 1   hydrocortisone  cream 1 % Apply topically 2 (two) times daily. 30 g 2   hydrOXYzine  (ATARAX ) 25 MG tablet TAKE 1 TABLET BY MOUTH ONCE DAILY AS NEEDED FOR ANXIETY 30 tablet 0   ipratropium-albuterol  (DUONEB) 0.5-2.5 (3) MG/3ML SOLN USE 1 VIAL IN NEBULIZER TWICE DAILY AS NEEDED FOR WHEEZING FOR SHORTNESS OF BREATH 360 mL 0   isosorbide  mononitrate (IMDUR ) 30 MG 24 hr tablet Take 1 tablet (30 mg total) by mouth 2 (two) times daily. 180 tablet 3   metoprolol  succinate (TOPROL -XL) 50 MG 24 hr tablet Take 1 tablet (50 mg total) by mouth daily. 90 tablet 3   montelukast  (SINGULAIR ) 10 MG tablet Take 1 tablet (10 mg total) by mouth at bedtime. 90 tablet 3   nitroGLYCERIN  (NITROSTAT ) 0.4 MG SL tablet  DISSOLVE ONE TABLET UNDER THE  TONGUE EVERY 5 MINUTES AS NEEDED FOR CHEST PAIN.  DO NOT EXCEED A TOTAL OF 3 DOSES IN 15 MINUTES 25 tablet 3   olopatadine  (PATANOL) 0.1 % ophthalmic solution INSTILL 1 DROP INTO EACH EYE TWICE DAILY 10 mL 1   ondansetron  (ZOFRAN -ODT) 4 MG disintegrating tablet Take 1 tablet (4 mg total) by mouth every 8 (eight) hours as needed for nausea or vomiting. 20 tablet 0   PARoxetine  (PAXIL ) 20 MG tablet Take 1 tablet (20 mg total) by mouth daily. 90 tablet 3   potassium chloride  (KLOR-CON  M) 10 MEQ tablet Take 1 tablet (10 mEq total) by mouth daily. 90 tablet 3   [START ON 09/12/2024] pregabalin  (LYRICA ) 75 MG capsule Take 1 capsule (75 mg total) by mouth 2 (two) times daily. 180 capsule 3   rivaroxaban  (XARELTO ) 2.5 MG TABS tablet Take 1 tablet (2.5 mg total) by mouth 2 (two) times daily. 180 tablet 1   rosuvastatin  (CRESTOR ) 20 MG tablet Take 1 tablet (20 mg total) by mouth at bedtime. 90 tablet 3   sodium chloride  (OCEAN) 0.65 % SOLN nasal spray Place 2 sprays into both nostrils daily as needed for congestion. 30 mL 11   solifenacin  (VESICARE ) 5 MG tablet Take 1 tablet (5 mg total) by mouth daily. 30 tablet 11   Spacer/Aero-Holding Chambers (AEROCHAMBER MV) inhaler Use as instructed 1 each 0   doxycycline  (VIBRA -TABS) 100 MG tablet Take 1 tablet (100 mg total) by mouth 2 (two) times daily for 7 days. (Patient not taking: Reported on 09/01/2024) 14 tablet 0   guaiFENesin  (MUCINEX ) 600 MG 12 hr tablet Take 1 tablet (600 mg total) by mouth 2 (two) times daily. (Patient not taking: Reported on 09/01/2024) 20 tablet 0   No facility-administered medications prior to visit.   BP 130/80   Pulse 89   Temp 98.5 F (36.9 C)   Ht 5' 5 (1.651 m)   Wt 198 lb 9.6 oz (90.1 kg)   SpO2 98%   BMI 33.05 kg/m     Physical Examination:  General Appearance: No distress  EYES EOM intact.   NECK Supple, No Bruits Pulmonary normal BS,No wheezing, No Rhonchi.  Cardiac S1,S2.  No  Murmurs Ext No edema Neuro No Focal Deficits ALL OTHER ROS ARE NEGATIVE   Lab Results:  CBC    Component Value Date/Time   WBC 8.4 08/05/2024 1901   RBC 4.93 08/05/2024 1901   HGB 15.5 (H) 08/05/2024 1901   HGB 12.8 02/13/2012 2319   HCT 47.6 (H) 08/05/2024 1901   HCT 38.3 02/13/2012 2319   PLT 166 08/05/2024 1901   PLT 144 (L) 02/13/2012 2319   MCV 96.6 08/05/2024 1901   MCV 94 02/13/2012 2319   MCH 31.4 08/05/2024 1901   MCHC 32.6 08/05/2024 1901   RDW 13.5 08/05/2024 1901   RDW 13.9 02/13/2012 2319   LYMPHSABS 2.7 03/23/2024 1354   MONOABS 0.5 03/23/2024 1354   EOSABS 0.1 03/23/2024 1354   BASOSABS 0.0 03/23/2024 1354    BMET    Component Value Date/Time   NA 140 08/05/2024 1901   NA 142 01/20/2014 1127   NA 141 02/13/2012 2319   K 4.7 08/05/2024 1901   K 3.6 02/13/2012 2319   CL 101 08/05/2024 1901   CL 106 02/13/2012 2319   CO2 24 08/05/2024 1901   CO2 27 02/13/2012 2319   GLUCOSE 132 (H) 08/05/2024 1901   GLUCOSE 92 02/13/2012 2319   BUN 13 08/05/2024 1901   BUN  16 01/20/2014 1127   BUN 16 02/13/2012 2319   CREATININE 1.10 (H) 08/05/2024 1901   CREATININE 1.01 (H) 08/21/2020 1014   CALCIUM  10.1 08/05/2024 1901   CALCIUM  8.7 02/13/2012 2319   GFRNONAA 55 (L) 08/05/2024 1901   GFRNONAA >60 02/13/2012 2319   GFRAA >60 08/25/2019 1018   GFRAA >60 02/13/2012 2319    BNP    Component Value Date/Time   BNP 1,292 (H) 02/13/2012 2319       Assessment & Plan:   64 year old African-American female seen today for follow-up assessment for severe persistent asthma patient has refused biologic agents in the setting of  obstructive sleep apnea AHI of 11  Assessment of severe persistent asthma Patient is ready to start biologic immunotherapy Plan to initiate Fasenra injections Pharmacy consultation pending Continue Trelegy as prescribed Nebulizers as needed No exacerbation at this time No evidence of heart failure at this time No evidence or signs  of infection at this time No respiratory distress No fevers, chills, nausea, vomiting, diarrhea No evidence of lower extremity edema No evidence hemoptysis Avoid Allergens and Irritants Avoid secondhand smoke Avoid SICK contacts Recommend  Masking  when appropriate Recommend Keep up-to-date with vaccinations    OSA (obstructive sleep apnea) Mild OSA Plan to start auto CPAP 4-8      MEDICATION ADJUSTMENTS/LABS AND TESTS ORDERED: Start Fasenra Continue Trelegy Rinse mouth Avoid allergens   CURRENT MEDICATIONS REVIEWED AT LENGTH WITH PATIENT TODAY   Patient  satisfied with Plan of action and management. All questions answered   Follow up 3 months   I spent a total of 41 minutes dedicated to the care of this patient on the date of this encounter to include pre-visit review of records, face-to-face time with the patient discussing conditions above, post visit ordering of testing, clinical documentation with the electronic health record, making appropriate referrals as documented, and communicating necessary information to the patient's healthcare team.    The Patient requires high complexity decision making for assessment and support, frequent evaluation and titration of therapies, application of advanced monitoring technologies and extensive interpretation of multiple databases.  Patient satisfied with Plan of action and management. All questions answered    Nickolas Alm Cellar, M.D.  [facility]  Medical Director [facility]    "

## 2024-09-01 NOTE — Patient Instructions (Addendum)
 Recommend starting auto CPAP 4-8 for sleep apnea Recommend starting Fasenra injection therapy for severe persistent asthma Continue Trelegy as prescribed  Need to be using your CPAP every night, minimum of 4-6 hours a night.  Change equipment as directed. Wash your tubing with warm soap and water  daily, hang to dry. Wash humidifier portion weekly. Use bottled, distilled water  and change daily Be aware of reduced alertness and do not drive or operate heavy machinery if experiencing this or drowsiness.  Exercise encouraged, as tolerated. Healthy weight management discussed.  Avoid or decrease alcohol consumption and medications that make you more sleepy, if possible. Notify if persistent daytime sleepiness occurs even with consistent use of PAP therapy.   Change CPAP supplies... Every month Mask cushions and/or nasal pillows CPAP machine filters Every 3 months Mask frame (not including the headgear) CPAP tubing Every 6 months Mask headgear Chin strap (if applicable) Humidifier water  tub

## 2024-11-15 ENCOUNTER — Encounter

## 2024-12-06 ENCOUNTER — Ambulatory Visit: Admitting: Internal Medicine

## 2025-01-10 ENCOUNTER — Ambulatory Visit

## 2025-08-29 ENCOUNTER — Ambulatory Visit
